# Patient Record
Sex: Female | Born: 1965 | Race: Black or African American | Hispanic: No | Marital: Single | Smoking: Former smoker
Health system: Southern US, Community
[De-identification: ages and names within clinical notes are randomized; demographics above are authoritative.]

## PROBLEM LIST (undated history)

## (undated) DIAGNOSIS — G4700 Insomnia, unspecified: Secondary | ICD-10-CM

## (undated) DIAGNOSIS — C801 Malignant (primary) neoplasm, unspecified: Secondary | ICD-10-CM

## (undated) DIAGNOSIS — E039 Hypothyroidism, unspecified: Secondary | ICD-10-CM

## (undated) DIAGNOSIS — G43909 Migraine, unspecified, not intractable, without status migrainosus: Secondary | ICD-10-CM

## (undated) DIAGNOSIS — I639 Cerebral infarction, unspecified: Secondary | ICD-10-CM

## (undated) DIAGNOSIS — Z789 Other specified health status: Secondary | ICD-10-CM

## (undated) DIAGNOSIS — R0602 Shortness of breath: Secondary | ICD-10-CM

## (undated) DIAGNOSIS — Z90710 Acquired absence of both cervix and uterus: Secondary | ICD-10-CM

## (undated) DIAGNOSIS — D649 Anemia, unspecified: Secondary | ICD-10-CM

## (undated) DIAGNOSIS — M1990 Unspecified osteoarthritis, unspecified site: Secondary | ICD-10-CM

## (undated) DIAGNOSIS — I10 Essential (primary) hypertension: Secondary | ICD-10-CM

## (undated) DIAGNOSIS — E7800 Pure hypercholesterolemia, unspecified: Secondary | ICD-10-CM

## (undated) DIAGNOSIS — E079 Disorder of thyroid, unspecified: Secondary | ICD-10-CM

## (undated) DIAGNOSIS — F419 Anxiety disorder, unspecified: Secondary | ICD-10-CM

## (undated) DIAGNOSIS — C539 Malignant neoplasm of cervix uteri, unspecified: Secondary | ICD-10-CM

## (undated) HISTORY — PX: THYROIDECTOMY, PARTIAL: SHX18

## (undated) HISTORY — PX: CHOLECYSTECTOMY: SHX55

## (undated) HISTORY — PX: ABDOMINAL HYSTERECTOMY: SHX81

## (undated) HISTORY — DX: Insomnia, unspecified: G47.00

---

## 2003-08-28 ENCOUNTER — Emergency Department (HOSPITAL_COMMUNITY): Admission: EM | Admit: 2003-08-28 | Discharge: 2003-08-28 | Payer: Self-pay | Admitting: Emergency Medicine

## 2003-09-01 ENCOUNTER — Emergency Department (HOSPITAL_COMMUNITY): Admission: EM | Admit: 2003-09-01 | Discharge: 2003-09-01 | Payer: Self-pay | Admitting: Emergency Medicine

## 2004-04-26 ENCOUNTER — Emergency Department (HOSPITAL_COMMUNITY): Admission: EM | Admit: 2004-04-26 | Discharge: 2004-04-26 | Payer: Self-pay | Admitting: Emergency Medicine

## 2004-06-08 ENCOUNTER — Emergency Department (HOSPITAL_COMMUNITY): Admission: EM | Admit: 2004-06-08 | Discharge: 2004-06-08 | Payer: Self-pay | Admitting: Emergency Medicine

## 2004-08-06 ENCOUNTER — Emergency Department (HOSPITAL_COMMUNITY): Admission: EM | Admit: 2004-08-06 | Discharge: 2004-08-07 | Payer: Self-pay | Admitting: Emergency Medicine

## 2004-12-12 ENCOUNTER — Emergency Department (HOSPITAL_COMMUNITY): Admission: EM | Admit: 2004-12-12 | Discharge: 2004-12-12 | Payer: Self-pay | Admitting: Emergency Medicine

## 2004-12-29 ENCOUNTER — Emergency Department (HOSPITAL_COMMUNITY): Admission: EM | Admit: 2004-12-29 | Discharge: 2004-12-30 | Payer: Self-pay

## 2005-01-18 ENCOUNTER — Encounter: Admission: RE | Admit: 2005-01-18 | Discharge: 2005-01-18 | Payer: Self-pay | Admitting: Nephrology

## 2005-02-04 ENCOUNTER — Encounter: Admission: RE | Admit: 2005-02-04 | Discharge: 2005-02-04 | Payer: Self-pay | Admitting: Nephrology

## 2005-03-18 ENCOUNTER — Emergency Department (HOSPITAL_COMMUNITY): Admission: EM | Admit: 2005-03-18 | Discharge: 2005-03-18 | Payer: Self-pay | Admitting: Family Medicine

## 2005-03-22 ENCOUNTER — Emergency Department (HOSPITAL_COMMUNITY): Admission: EM | Admit: 2005-03-22 | Discharge: 2005-03-22 | Payer: Self-pay | Admitting: Family Medicine

## 2005-03-22 ENCOUNTER — Ambulatory Visit (HOSPITAL_COMMUNITY): Admission: RE | Admit: 2005-03-22 | Discharge: 2005-03-22 | Payer: Self-pay | Admitting: Family Medicine

## 2005-05-21 ENCOUNTER — Emergency Department (HOSPITAL_COMMUNITY): Admission: EM | Admit: 2005-05-21 | Discharge: 2005-05-22 | Payer: Self-pay | Admitting: Emergency Medicine

## 2005-09-08 ENCOUNTER — Encounter: Admission: RE | Admit: 2005-09-08 | Discharge: 2005-09-08 | Payer: Self-pay | Admitting: Nephrology

## 2005-11-09 ENCOUNTER — Emergency Department (HOSPITAL_COMMUNITY): Admission: EM | Admit: 2005-11-09 | Discharge: 2005-11-10 | Payer: Self-pay | Admitting: Emergency Medicine

## 2006-04-25 ENCOUNTER — Emergency Department (HOSPITAL_COMMUNITY): Admission: EM | Admit: 2006-04-25 | Discharge: 2006-04-25 | Payer: Self-pay | Admitting: Emergency Medicine

## 2006-05-03 ENCOUNTER — Emergency Department (HOSPITAL_COMMUNITY): Admission: EM | Admit: 2006-05-03 | Discharge: 2006-05-03 | Payer: Self-pay | Admitting: Family Medicine

## 2007-02-07 ENCOUNTER — Emergency Department (HOSPITAL_COMMUNITY): Admission: EM | Admit: 2007-02-07 | Discharge: 2007-02-07 | Payer: Self-pay | Admitting: Family Medicine

## 2007-09-09 ENCOUNTER — Emergency Department (HOSPITAL_COMMUNITY): Admission: EM | Admit: 2007-09-09 | Discharge: 2007-09-10 | Payer: Self-pay | Admitting: Emergency Medicine

## 2008-04-17 ENCOUNTER — Inpatient Hospital Stay (HOSPITAL_COMMUNITY): Admission: EM | Admit: 2008-04-17 | Discharge: 2008-04-20 | Payer: Self-pay | Admitting: Family Medicine

## 2008-04-18 ENCOUNTER — Encounter (INDEPENDENT_AMBULATORY_CARE_PROVIDER_SITE_OTHER): Payer: Self-pay | Admitting: Cardiovascular Disease

## 2008-10-14 ENCOUNTER — Inpatient Hospital Stay (HOSPITAL_COMMUNITY): Admission: EM | Admit: 2008-10-14 | Discharge: 2008-10-19 | Payer: Self-pay | Admitting: Emergency Medicine

## 2008-11-26 ENCOUNTER — Ambulatory Visit: Payer: Self-pay | Admitting: Internal Medicine

## 2008-11-26 DIAGNOSIS — R519 Headache, unspecified: Secondary | ICD-10-CM | POA: Insufficient documentation

## 2008-11-26 DIAGNOSIS — R51 Headache: Secondary | ICD-10-CM

## 2008-11-26 DIAGNOSIS — J45909 Unspecified asthma, uncomplicated: Secondary | ICD-10-CM

## 2008-11-28 ENCOUNTER — Inpatient Hospital Stay (HOSPITAL_COMMUNITY): Admission: RE | Admit: 2008-11-28 | Discharge: 2008-11-30 | Payer: Self-pay | Admitting: Obstetrics and Gynecology

## 2008-11-28 ENCOUNTER — Encounter (INDEPENDENT_AMBULATORY_CARE_PROVIDER_SITE_OTHER): Payer: Self-pay | Admitting: Obstetrics and Gynecology

## 2008-12-21 ENCOUNTER — Emergency Department (HOSPITAL_COMMUNITY): Admission: EM | Admit: 2008-12-21 | Discharge: 2008-12-21 | Payer: Self-pay | Admitting: Family Medicine

## 2008-12-29 ENCOUNTER — Emergency Department (HOSPITAL_COMMUNITY): Admission: EM | Admit: 2008-12-29 | Discharge: 2008-12-30 | Payer: Self-pay | Admitting: *Deleted

## 2009-01-02 ENCOUNTER — Ambulatory Visit (HOSPITAL_COMMUNITY): Admission: RE | Admit: 2009-01-02 | Discharge: 2009-01-02 | Payer: Self-pay | Admitting: Obstetrics and Gynecology

## 2009-01-03 ENCOUNTER — Ambulatory Visit (HOSPITAL_COMMUNITY): Admission: RE | Admit: 2009-01-03 | Discharge: 2009-01-03 | Payer: Self-pay | Admitting: Obstetrics and Gynecology

## 2009-01-14 ENCOUNTER — Encounter (HOSPITAL_BASED_OUTPATIENT_CLINIC_OR_DEPARTMENT_OTHER): Admission: RE | Admit: 2009-01-14 | Discharge: 2009-02-13 | Payer: Self-pay | Admitting: Internal Medicine

## 2009-04-29 ENCOUNTER — Emergency Department (HOSPITAL_COMMUNITY): Admission: EM | Admit: 2009-04-29 | Discharge: 2009-04-29 | Payer: Self-pay | Admitting: Emergency Medicine

## 2009-04-29 ENCOUNTER — Emergency Department (HOSPITAL_COMMUNITY): Admission: EM | Admit: 2009-04-29 | Discharge: 2009-04-29 | Payer: Self-pay | Admitting: Family Medicine

## 2009-05-13 ENCOUNTER — Emergency Department (HOSPITAL_COMMUNITY): Admission: EM | Admit: 2009-05-13 | Discharge: 2009-05-13 | Payer: Self-pay | Admitting: Emergency Medicine

## 2009-06-25 ENCOUNTER — Emergency Department (HOSPITAL_COMMUNITY): Admission: EM | Admit: 2009-06-25 | Discharge: 2009-06-25 | Payer: Self-pay | Admitting: Emergency Medicine

## 2009-09-18 ENCOUNTER — Emergency Department (HOSPITAL_COMMUNITY): Admission: EM | Admit: 2009-09-18 | Discharge: 2009-09-18 | Payer: Self-pay | Admitting: Emergency Medicine

## 2010-10-17 ENCOUNTER — Observation Stay (HOSPITAL_COMMUNITY): Admission: EM | Admit: 2010-10-17 | Discharge: 2010-10-18 | Payer: Self-pay | Admitting: Emergency Medicine

## 2011-01-03 ENCOUNTER — Encounter: Payer: Self-pay | Admitting: Obstetrics and Gynecology

## 2011-03-23 LAB — CBC
HCT: 31.6 % — ABNORMAL LOW (ref 36.0–46.0)
Hemoglobin: 10.1 g/dL — ABNORMAL LOW (ref 12.0–15.0)
MCHC: 31.9 g/dL (ref 30.0–36.0)
MCV: 70.8 fL — ABNORMAL LOW (ref 78.0–100.0)
RDW: 19.5 % — ABNORMAL HIGH (ref 11.5–15.5)

## 2011-03-23 LAB — POCT I-STAT, CHEM 8
Calcium, Ion: 1.22 mmol/L (ref 1.12–1.32)
Glucose, Bld: 90 mg/dL (ref 70–99)
HCT: 33 % — ABNORMAL LOW (ref 36.0–46.0)
Hemoglobin: 11.2 g/dL — ABNORMAL LOW (ref 12.0–15.0)
TCO2: 21 mmol/L (ref 0–100)

## 2011-03-29 LAB — CBC
HCT: 27.1 % — ABNORMAL LOW (ref 36.0–46.0)
Hemoglobin: 8.7 g/dL — ABNORMAL LOW (ref 12.0–15.0)
MCHC: 32 g/dL (ref 30.0–36.0)
RDW: 19.4 % — ABNORMAL HIGH (ref 11.5–15.5)

## 2011-04-27 NOTE — Discharge Summary (Signed)
Wendy Hoffman, Wendy Hoffman               ACCOUNT NO.:  [id number]   MEDICAL RECORD NO.:  [id number]          PATIENT TYPE:  INP   LOCATION:  9305                          FACILITY:  [facility]   PHYSICIAN:  Hal Morales, M.D.DATE OF BIRTH:  1966/06/11   DATE OF ADMISSION:  11/28/2008  DATE OF DISCHARGE:  11/30/2008                               DISCHARGE SUMMARY   DISCHARGE DIAGNOSES:  Menorrhagia, symptomatic uterine fibroids,  adenomyosis, pelvic adhesions, anemia, dysmenorrhea, obesity, and  asthma.   OPERATION ON THE DATE OF ADMISSION:  The patient underwent laparoscopy  followed by a total abdominal hysterectomy tolerating procedure well.  The patient was found to have a uterus enlarged to approximately 10-week  size with several myomata.  There were adhesions between the right  anterior fundus and the right pelvic side wall and omentum.  There were  also filmy adhesions of the posterior uterus particularly on the left  side and between the right ovary and posterior uterus.  The tubes were  status post tubal interruption for sterilization.   HISTORY OF PRESENT ILLNESS:  Ms. Wendy Hoffman is a 45 year old single black  female para 3-0-1-3 presenting for definitive therapy of uterine  fibroids and menorrhagia in the form of hysterectomy.  Please see the  patient's dictated history and physical examination for details.   PREOPERATIVE PHYSICAL EXAMINATION:  Blood pressure 120/80, pulse 88,  respirations 16, temperature 98.7 degrees Fahrenheit orally, weight 284  pounds, height 5 feet 4 inches tall.  General exam was within normal  limits, though do note abdominal exam was compromised by the patient's  body habitus.  Pelvic exam EGD was within normal limits.  Vagina was  rugose.  The cervix was without gross lesions.  The uterus does not feel  enlarged, but the examination is compromised by the patient's habitus.  There were no adnexal masses found.  The uterus was sounded to 10 cm.  Rectovaginal exam did not reveal any rectovaginal masses.   HOSPITAL COURSE:  On the date of admission, the patient underwent  aforementioned procedures.  The patient's postoperative course was  unremarkable with her tolerating very well.  A postop hemoglobin of 7.7  (preop hemoglobin was 9.3).  By postop day #2, the patient had resumed  bowel and bladder function, denied any symptoms or orthostasis, any  symptoms of being orthostatic, and was therefore deemed ready for  discharge home.   DISCHARGE MEDICATIONS:  The patient was directed to her home medication  reconciliation form.  She was also prescribed ibuprofen 600 mg with food  every 6 hours for 5 days then as needed for pain, Percocet 5/325 one to  two tablets every 4 hours as needed for pain, iron sulfate 325 mg 1  tablet daily.   FOLLOWUP:  The patient is scheduled for postop visit with Dr. Pennie Hoffman on  December 04, 2008, and was advised that a nurse from Dr. Lilian Hoffman  office would call her with details of her appointment time.   DISCHARGE INSTRUCTIONS:  The patient was advised to call for any  temperature of 100.4 or greater, any increased pain, which is  not  relieved by her pain medications, or any increased vaginal bleeding.  She was given a copy of the brochure entitled recovering from surgery.  She was further advised to avoid driving for 2-3 weeks, heavy lifting  for 6 weeks, intercourse for 6 weeks that she may shower and bathe that  she may walk up steps, and should increase her activities slowly.  The  patient's diet was without restriction, though she was cautioned not to  consume any gas producing foods.  The patient's wound care was given to  the patient in printed form.  She was further advised to use a blow dry  whenever moisture accumulate around her incisional area.   FINAL PATHOLOGY:  Uterus and cervix and a portion of left fallopian  tube, hysterectomy, and salpingectomy:  Cervix - mild chronic   inflammation, endometrium - weakly proliferative-type endometrium with  pseudodecidualization of the stroma, myometrium - adenomyosis and  leiomyomata, serosa - essentially unremarkable and left fallopian tube -  benign paratubal cyst.      Wendy Hoffman.      Hal Morales, M.D.  Electronically Signed    EJP/MEDQ  D:  12/26/2008  T:  12/27/2008  Job:  [id number]

## 2011-04-27 NOTE — Op Note (Signed)
Wendy Hoffman, Wendy Hoffman               ACCOUNT NO.:  [id number]   MEDICAL RECORD NO.:  [id number]          PATIENT TYPE:  INP   LOCATION:  9305                          FACILITY:  [facility]   PHYSICIAN:  Hal Morales, M.D.DATE OF BIRTH:  11-17-1966   DATE OF PROCEDURE:  DATE OF DISCHARGE:                               OPERATIVE REPORT   PREOPERATIVE DIAGNOSES:  1. Menorrhagia.  2. Uterine fibroids.  3. Anemia, requiring transfusion.  4. Dysmenorrhea.  5. Obesity.  6. Asthma.   POSTOPERATIVE DIAGNOSES:  1. Menorrhagia.  2. Uterine fibroids.  3. Anemia, requiring transfusion.  4. Dysmenorrhea.  5. Obesity.  6. Asthma.  7. Pelvic adhesions.   PROCEDURE:  1. Laparoscopy.  2. Total abdominal hysterectomy.   SURGEON:  Vanessa P. Pennie Rushing, MD   FIRST ASSISTANT:  Janine Limbo, MD   ANESTHESIA:  General orotracheal.   ESTIMATED BLOOD LOSS:  250 mL.   COMPLICATIONS:  None.   FINDINGS:  The uterus was enlarged to approximately 10 weeks' size with  several myomata.  There were adhesions between the right anterior fundus  and the right pelvic sidewall and omentum.  There were filmy adhesions  of the posterior uterus particularly on the left side and between the  right ovary and posterior uterus.  The tubes were status post tubal  interruption for sterilization.   PROCEDURE:  The patient was taken to the operating room after  appropriate identification and placed on the operating table.  After the  attainment of adequate general anesthesia, she was placed in the  modified lithotomy position.  The abdomen, perineum, and vagina were  prepped with multiple layers of Betadine, and a Foley catheter inserted  into the bladder and connected to straight drainage.  A Hulka tenaculum  was placed on the anterior cervix.  The abdomen was draped as a sterile  field.  Suprapubic and subumbilical injections of 0.25% Marcaine for a  total of 10 mL was undertaken.  The subumbilical incision  was made, and  a Veress cannula placed through that incision into the abdomen, but  because of the thick panniculus, it could not be documented that the  placement was intraperitoneal.  The Veress cannula was replaced a second  time, but after a third attempt a decision was made to proceed with an  open laparoscopy.  The subumbilical incision was slightly extended and  blunt dissection taken down to the fascia which was elevated.  The  fascia was incised, and the peritoneum entered.  The entry was then  marked with sutures of 0 Vicryl, and the Hasson cannula placed through  that incision into the peritoneal cavity.  A pneumoperitoneum was  created, but there was noted to be lots of leakage, and a Vaseline gauze  was placed around the trocar.  The laparoscope was placed through the  trocar sleeve.  A left mid quadrant incision was made for placement of a  laparoscopic trocar 5 mm under direct visualization.  A suprapubic  incision was made to the right of midline for placement of a third  trocar, this one 5 mm as  well.  After placement of all of the  appropriate trocars, it was noted that the area to the right of the  fundus required lysis of adhesions.  Steep Trendelenburg was requested  in order to obtain adequate visualization.  The patient was unable to  tolerate this and became hypercarbic.  The anesthesia personnel notified  the surgeons that they were unable to ventilate the patient in steep  Trendelenburg because of her obesity and it was clear that the  Trendelenburg would be required for any further laparoscopic dissection.  It was decided that the patient would undergo abdominal hysterectomy.  All instruments were then removed from the abdominal cavity under direct  visualization as the CO2 was allowed to escape.  The fascial sutures  were completed in the subumbilical incision and tied down.  A  subcuticular suture of 3-0 Vicryl was then used to close the skin  incision.  The  left abdominal incision was closed with a subcuticular  suture of 3-0 Vicryl.  The right suprapubic incision was incorporated  into the suprapubic incision which was made after the patient had been  placed in the supine position.  The abdomen was opened in layers and the  peritoneum entered.  The O'Connor-O'Sullivan retractor was placed in the  incision, and the bowel packed cephalad.  A Kelly clamp was placed at  each cornual region, and sharp and blunt dissection used to dissect the  adherent omentum off the right anterior fundus.  The utero-ovarian  ligament and round ligament were then clamped, cut, and suture ligated.  On the left side, the round ligament was isolated, suture ligated, and  incised and that incision taken anteriorly to the broad ligament to  develop the bladder flap.  The bladder was then sharply dissected off  the anterior cervix.  The utero-ovarian ligament on the left side was  then clamped, cut, and suture ligated.  The uterine arteries were  skeletonized and successively clamped, cut, and suture ligated on the  right and left sides.  The parametrial and paracervical tissues were  then clamped, cut, and suture ligated down to the level of the  uterosacral ligaments which were clamped, cut, and suture ligated and  those sutures held.  The vaginal angles were then clamped, cut, and  suture ligated and those sutures held.  The remainder of the cervix was  excised from the upper vagina, and the uterus and cervix removed from  the operative field.  Hemostatic sutures were placed in the vaginal cuff  allowing for complete closure of the vaginal cuff, and hemostasis noted  to be adequate after copious irrigation.  All sutures used were 0  Vicryl.  The left tube and ovary were then isolated.  The remainder of  the tube status post bilateral tubal sterilization was then clamped,  cut, and the mesosalpingeal base suture ligated for adequate hemostasis.  The right fallopian  tube was noted to be so adherent to the ovary that  salpingectomy on that side was not performed.  Copious irrigation was  carried out and hemostasis noted to be adequate.  The abdominal  peritoneum was closed with a running suture of 2-0 Vicryl.  The rectus  muscles were made hemostatic with Bovie cautery.  The rectus fascia was  closed with running sutures of 0 Vicryl from each apex to the midline  and tied in the midline.  The subcutaneous tissue was reapproximated  with a running suture of 2-0 plain.  Skin staples were applied to the  skin incision, and a sterile  dressing applied.  The patient was awakened from general anesthesia and  taken to the recovery room in satisfactory condition having tolerated  the procedure well with sponge and instrument counts correct.   SPECIMENS TO PATHOLOGY:  Uterus, cervix, and left tube.      Hal Morales, M.D.  Electronically Signed     VPH/MEDQ  D:  11/28/2008  T:  11/29/2008  Job:  [id number]   cc:   Jarome Matin, M.D.  Fax: [phone]

## 2011-04-27 NOTE — Consult Note (Signed)
Wendy Hoffman, Wendy Hoffman               ACCOUNT NO.:  [id number]   MEDICAL RECORD NO.:  [id number]          PATIENT TYPE:  INP   LOCATION:  4737                         FACILITY:  [facility]   PHYSICIAN:  John C. [name], M.D.    DATE OF BIRTH:  [birth year]   DATE OF CONSULTATION:  DATE OF DISCHARGE:                                 CONSULTATION   We were asked to see Wendy Hoffman today in consultation for a low  hemoglobin by Dr. Orpah Cobb.   HISTORY OF PRESENT ILLNESS:  This is a 45 year old female who is  complaining of severe right chest pain.  It feels like a squeezing that  has lasted for approximately 24 hours that is what brought her to the  hospital.  She is also complaining of a migraine headache.  She has a  history of anemia with her 3 pregnancies.  She also has very heavy  menstrual periods during which she passes clots and she claims that she  has had anemia with those.  She does not take iron because it  constipates her.  She also reports that she takes regular 800 mg  ibuprofen tablets for her migraines.  She does report heartburn and  indigestion.  She used to take Nexium for this, but her prescription ran  out over a month ago.  She denies any melena or hematochezia, although  she reports that she did have an episode of bright red blood per rectum,  which she reported to her primary care physician.  She tells me that she  has recent constipation, but a few minutes later she tells me she has  diarrhea every morning.   PAST MEDICAL HISTORY:  Significant for coronary artery disease,  hypertension, type 2 diabetes, hypothyroidism, asthma, migraines, and  obesity.  She has had a cholecystectomy and thyroidectomy and 3 C-  sections with her 3 children.  She tells me that her heart stopped each  time she had a C-section.   CURRENT MEDICATIONS:  Advair, albuterol, Singulair, sulindac, Synthroid,  and Topamax.   ALLERGIES:  She has an allergy to PENICILLIN and ASPIRIN.   REVIEW OF  SYSTEMS:  Significant for a decreased appetite, nausea,  fatigue, and headache.   SOCIAL HISTORY:  Negative for alcohol, tobacco, and drugs.   FAMILY HISTORY:  Positive for ulcer disease in her father.  Negative for  colon cancer.   PHYSICAL EXAMINATION:  GENERAL:  She is alert and oriented, sleepy,  obese.  VITAL SIGNS:  Her temperature is 97.1, pulse is 61, respirations are 20,  and blood pressure is 142/96.  CARDIOVASCULAR:  Has a regular rate and rhythm.  LUNGS:  Clear to auscultation anteriorly.  ABDOMEN:  Soft, nontender, and nondistended with good bowel sounds.  RECTAL:  She has internal hemorrhoids.  No stool in rectal bowl, but the  fluid was guaiac positive.  There was no blood on my glove.   LABORATORY DATA:  Current BMET is within normal limits.  CBC is  significant for current hemoglobin of 8.6.  When she came in, it looks  like they took 2  values within the same hour, one of which was 7.8, the  other was 9.2.  Her current hemoglobin shows an MCV value of 73.3, white  count 26.4, platelets 345,000, PTT is 27, PT is 12.9.  There have been  no radiological exams done at this admission.   ASSESSMENT:  Dr. Dorena Cookey has seen and examined the patient, collected  history, and reviewed her chart.  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  She does have a history of strong nonsteroidal  antiinflammatory drug use without proton pump inhibitor coverage;  however, she also has heavy periods and a long history of anemia.  We  will do an anemia panel, monitor her closely.  Need for endoscopy will  be determined later.  The patient will probably benefit from an  outpatient colonoscopy.   Thank you very much for this consultation.      Stephani Police, PA    ______________________________  [name], M.D.    MLY/MEDQ  D:  04/17/2008  T:  04/18/2008  Job:  [id number]   cc:   Dr. Leretha Dykes

## 2011-04-27 NOTE — Discharge Summary (Signed)
NAMEGABRILLE, Wendy Hoffman               ACCOUNT NO.:  [id number]   MEDICAL RECORD NO.:  [id number]          PATIENT TYPE:  INP   LOCATION:  9305                          FACILITY:  [facility]   PHYSICIAN:  Jarome Matin, M.D.DATE OF BIRTH:  [birth year]   DATE OF ADMISSION:  10/14/2008  DATE OF DISCHARGE:  10/19/2008                               DISCHARGE SUMMARY   ADMITTING DIAGNOSES:  1. Chills.  2. Fever.  3. Headache.  4. Chest pain.  5. Body aches.   DISCHARGE DIAGNOSES:  1. Iron deficiency anemia.  2. Flu, possible HINI.  The patient was initially kept in isolation      because of the flu virus  3. Orthostatic hypotension.  4. Type 2 diabetes mellitus.  5. Hypertension.  6. Asthma.  7. Morbid obesity.  8. Post-surgical hypothyroid.  9. Atherosclerosis of coronary vessels.  10.She has a history of TIA's.   BRIEF HISTORY AND PHYSICAL AND HOSPITAL COURSE:  This was on admission.  This 45 year old African American, who for the past 24-48 hours was  having chills, fever, headaches, chest pain, and weakness and she had  been admitted with chest pain back in May 2009.  The patient is a  diabetic, she has a history of hypertension.  She is obese and has a  history of hyperthyroid.  She lives at home with her son and her niece.  She also has a history of TIA's in the past.   Hemoglobin on this admission was 7.3.   VITAL SIGNS:  Temperature was 103.1 and fluctuated down to 98.3.  Pulse  was 112 to 107.  Blood pressure was 176/83 to 117/30, respirations 20.  GENERAL:  The patient was alert.  The patient was oriented.  NECK:  Neck veins were flat.  CHEST:  Clear to auscultation with percussion.  Regular sinus rhythm  with occasional PC.  Had occasional rhonchi.  ABDOMEN:  Soft.  No mass, no organomegaly.  She had active bowel sounds.  CHEST:  Clear to auscultation and percussion with an occasional wheeze.  SKIN:  Warm and dry.   The patient was admitted to telemetry and also  __________ on isolation  because of possible flu, in the setting of possible H1N1.  The patient  also got 3 units of packed red blood cells.  Gradually, she began to  feel better.  Her fever came down and was subsequently 99.1, pulse 80,  respirations 20, blood pressure 111/68, and 99% on room air.    Some of the pertinent labs.  Blood cultures had been fairly negative.  Iron was low at 14 and  saturation of 4.  Blood culture was negative for growth.  TSH 2.036.  Triglycerides 155.  CK-MB is showing an indication for myocardial  injury.  The patient was transfused up to about 9 hemoglobin.  Chest x-  ray did show some bronchial perihilar markings and peribronchial  __________, and the patient also did have an ultrasound of her pelvis,  which showed an enlargement of the uterus.  The entry __________ fibroid  and this maybe causing her vaginal bleeding.  We transfused the patient  up to a hemoglobin of 9.1 and asked GYN to see the patient and the  patient was known to GYN because of fibroid  and they agreed that they  would more or likely see her as an outpatient.  We did the ultrasound of  the pelvis and she has fibroid and GYN is going to follow her as an  outpatient.  So after a transfusion and the patient after being  stabilized, felt much better and chest x-ray became negative and the  patient was scheduled to be seen by GYN as an outpatient.  Dr. Ellyn Hack  __________ [facility], is going to see her.  Her temp is 98, pulse  67, respirations 16, and blood pressure 142/96 with a 100% on room air.  Chest, she had occasional rhonchi and a scheduled was made and she will  be returning to the office in about 2 weeks after discharge.           ______________________________  Jarome Matin, M.D.     CEF/MEDQ  D:  12/19/2008  T:  12/20/2008  Job:  [id number]

## 2011-04-27 NOTE — Op Note (Signed)
Wendy Hoffman, Wendy Hoffman               ACCOUNT NO.:  [id number]   MEDICAL RECORD NO.:  [id number]          PATIENT TYPE:  AMB   LOCATION:  SDC                           FACILITY:  [facility]   PHYSICIAN:  Hal Morales, M.D.DATE OF BIRTH:  1966-04-08   DATE OF PROCEDURE:  01/03/2009  DATE OF DISCHARGE:                               OPERATIVE REPORT   PREOPERATIVE DIAGNOSIS:  Status post abdominal hysterectomy with  drainage from abdominal incision.   POSTOPERATIVE DIAGNOSIS:  Left incisional granulomatous tissue.   PROCEDURE:  Examination under anesthesia and excision of granulomatous  tissue.   SURGEON:  Vanessa P. Haygood, MD   ANESTHESIA:  General orotracheal.   ESTIMATED BLOOD LOSS:  Less than 10 mL.   COMPLICATIONS:  None.   FINDINGS:  The abdominal incision was noted to be completely intact.  The previously noted pinpoint defect in the midline incision that had  been allowing for serous drainage was completely closed.  No drainage  could be appreciated nor reproduced with pressure along the entire  surface of the incision.  No pockets of fluid were palpable, which was  consistent with the CT scan.  At the left pole of the incision, a 1-cm  area of granulomatous tissue was noted and this was excised.  The cut  end was cauterized and hemostasis noted to be adequate.  No further  pockets of fluid could be appreciated.  No fluid could be aspirated upon  placement of a spinal needle in the incision.   PROCEDURE:  The patient was taken to the operating room after  appropriate identification and placed on the operating table.  After the  attainment of adequate general anesthesia, she was placed on the table  in the supine position.  The panniculus was then taped to the anesthesia  bar.  The incision area was cleansed with Betadine and draped.  The  above-noted findings were made and the granulation tissue sharply  excised then cauterized on its edge.  This allowed for adequate  hemostasis.  No drainage was noted at any time during the procedure from  her incision.  The procedure was thus ended and the patient was taken to  the recovery room after awakening from general anesthesia in  satisfactory condition.      Hal Morales, M.D.  Electronically Signed     VPH/MEDQ  D:  01/03/2009  T:  01/04/2009  Job:  [id number]

## 2011-04-27 NOTE — Op Note (Signed)
NAMECANIYA, TAGLE               ACCOUNT NO.:  [id number]   MEDICAL RECORD NO.:  [id number]          PATIENT TYPE:  INP   LOCATION:  4737                         FACILITY:  [facility]   PHYSICIAN:  John C. [name], M.D.    DATE OF BIRTH:  [birth year]   DATE OF PROCEDURE:  DATE OF DISCHARGE:                               OPERATIVE REPORT   INDICATIONS FOR PROCEDURE:  Significant iron deficiency anemia with  hemoglobin of 8.   PROCEDURE:  The patient was placed in the left lateral decubitus  position and placed on the pulse monitor with continuous low-flow oxygen  delivered by nasal cannula.  She was sedated with 75 mcg IV fentanyl and  8 mg of IV Versed.  The Olympus video endoscope was advanced under  direct vision into the oropharynx and esophagus.  The esophagus was  straight and of normal caliber.  The squamocolumnar line at 38 cm.  There was no visible hiatal hernia, ring stricture, or other abnormality  of the GE junction.  The stomach was entered and a small amount of  liquid secretions were suctioned from the fundus.  Retroflexed view of  cardia was unremarkable.  The fundus, body, antrum and pylorus all  appeared normal.  The duodenum was entered and the bulb showed 3-4  scattered erosions with exudate, possibly meeting the criteria for  shallow ulcers but with no stigma of hemorrhage.  The postbulbar  duodenum appeared normal.  The scope was withdrawn back into the stomach  and CLO-test was obtained.  The scope was then withdrawn and the patient  returned to the recovery room in stable condition.  She tolerated the  procedure well.  There were no immediate complications.   IMPRESSION:  Duodenal erosions with exudate.   PLAN:  Free to use proton-pump inhibitor and await CLO-test.           ______________________________  Everardo All. [name], M.D.     JCH/MEDQ  D:  04/18/2008  T:  04/19/2008  Job:  [id number]   cc:   Ricki Rodriguez, M.D.

## 2011-04-27 NOTE — H&P (Signed)
Wendy Hoffman, Wendy Hoffman               ACCOUNT NO.:  [id number]   MEDICAL RECORD NO.:  [id number]          PATIENT TYPE:  INP   LOCATION:  3713                         FACILITY:  [facility]   PHYSICIAN:  [name], M.D.DATE OF BIRTH:  October 09, 1966   DATE OF ADMISSION:  10/13/2008  DATE OF DISCHARGE:                              HISTORY & PHYSICAL   FINAL DIAGNOSES:  1. Anemia, critical.  2. Febrile illness, cannot rule out flu.  3. Hypotension.  4. History of diabetes mellitus.  5. Hypertension.  6. Bronchial asthma.  7. Iron deficiency anemia.  8. Morbid obesity.  9. Postsurgical hypothyroidism.   HISTORY OF PRESENT ILLNESS:  The patient is a 45 year old African  American lady who presented to ED with fever, chills, sore throat,  rhinorrhea, generalized weakness, and malaise.  She was noted to be  hypotensive with borderline blood pressures in the 80s.  She was also  noted to have a severely reduced hemoglobin and was admitted for further  management in this regard.  The patient was also noted to have  significant orthostasis.  In view of hypotensive orthostasis and low  hemoglobin, the patient admitted for further management in this regard,  further workup of her febrile illness.   PAST MEDICAL HISTORY:  As noted above.  In addition, the patient is said  to have history of CAD.   PAST SURGICAL HISTORY:  The patient is status post cholecystectomy,  thyroidectomy, and 3 cesarean sections.   CURRENT MEDICATIONS:  Advair, Singulair, albuterol, and Synthroid.   ALLERGIES:  She was allergic to PENICILLIN, ASPIRIN which causes a rash.   FAMILY HISTORY:  Positive for diabetes mellitus and hypertension.  Family history is positive for ulcer in her father, no history of colon  cancer.   SOCIAL HISTORY:  The patient does not smoke cigarette or drink alcohol.   REVIEW OF SYSTEMS:  The patient denies any sputum production, but admits  to cough.  No chest pain.  No PND.  No  orthopnea.  Admits to dizziness.  __________ 10-point __________ was unremarkable except as stated above.   VITAL SIGNS:  Noted as documented serially from ED records and  hypotension, blood pressure as noted.  GENERAL:  The patient was  __________ in acute distress.  HEENT:  Normocephalic and atraumatic.  Pupils equal, round, and reactive  to light.  Extraocular movements intact.  Oropharynx moist.  __________.  NECK:  Supple.  No JVD.  LUNGS:  Vesicular breath sounds.  No crackles.  CARDIAC:  Regular rate and rhythm, no gallops.  ABDOMEN:  Obese.  Bowel sound present.  EXTREMITIES:  No cyanosis.  No edema.  NEUROLOGIC:  The patient is alert and responsive.   LABORATORY DATA:  X-ray of the chest showed no acute infiltrate.  The  patient's white count was 6.3, hemoglobin 8.5, hematocrit 25.0, MCV  66.7, and platelet count 234.  Sodium 137, potassium 3.7, chloride 103,  glucose 102, BUN 5, and creatinine 1.1.  Total CPK 92, MB fraction 0.4,  and troponin I 0.01.   ASSESSMENT:  This patient presents with hypotension and febrile illness  with orthostasis, does not have leukocytosis, no evidence of sepsis.  The patient is admitted for blood transfusion but as she has history of  coronary artery disease and will aim for target hemoglobin of 10.0.  The  patient will be getting cultures for the patient and there is no  evidence of any focal infiltrates on x-ray.  No antibiotic will be used  __________ does not have any leukocytosis, may have to start her on  antibiotics coverage p.r.n.  We will follow up on her post-transfusion  transfusion hemoglobin.  The patient will need to be kept in isolation  since she is believed to have a febrile viral illness at this time.  She  also indicates she has a history of menorrhagia that may account for her  anemia.  She indicates that she is anemic all the time.      [name], M.D.  Electronically Signed     GO/MEDQ  D:  10/14/2008  T:   10/14/2008  Job:  [id number]   cc:   Jarome Matin, M.D.

## 2011-04-27 NOTE — H&P (Signed)
Wendy Hoffman, BLANKENBECKLER               ACCOUNT NO.:  [id number]   MEDICAL RECORD NO.:  [id number]          PATIENT TYPE:  AMB   LOCATION:  SDC                           [facility]   PHYSICIAN:  Hal Morales, M.D.DATE OF BIRTH:  10-Apr-1966   DATE OF ADMISSION:  11/28/2008  DATE OF DISCHARGE:                              HISTORY & PHYSICAL   HISTORY OF PRESENT ILLNESS:  The patient is a 45 year old black single  female para 3-0-1-3 who presents for definitive therapy of uterine  fibroids and menorrhagia.  She has for a number of years had  increasingly heavy menses which now amount to 7 days of flow  requiring  hourly changes in her protection and severe cramping associated with  this.  The patient has as a result of her menorrhagia had significant  anemia with hemoglobin as low as 6.0 and requiring a transfusion as  recently as November of 2009.  The patient denies any intermenstrual  bleeding.  She has regular monthly menses, but does have very large  clots with her menses.   The patient had a pelvic ultrasound performed at the time of her last  hospitalization which showed a uterus enlarged to 13.5 x 6.1 x 5.6 cm.  There was an intramural myoma measuring less than 2 cm and a  subendometrial intramural myoma which measured 2.7 x 2.4 x 2.2 cm.  The  right ovary showed a small right ovarian cyst measuring about 1 cm, and  the left ovary could not be visualized.   The patient had a normal TSH in February of 2009.  She has likewise had  a negative Pap smear in May of 2009.  She had hepatitis B surface  antigen, hepatitis C antibody, HIV,  RPR, all of which were negative.  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  She was noted to have  Trichomonas in May of 2009, and was appropriately treated.  She presents  at this time requesting definitive therapy of her menorrhagia and  dysmenorrhea as well as her uterine fibroids in the way of hysterectomy.  She declines conservative  measures of submucosal myoma resection or  endometrial ablation, Depo-Provera or Depo-Lupron.   PAST MEDICAL HISTORY:  Asthma which has been intermittently controlled,  but is also intermittently exacerbated.  She was hospitalized in  November of 2009 for bronchopneumonia with asthma complicating this.  She required intubation approximately 13 years ago during an episode of  pneumonia.   The patient also has a history of anemia thought to be secondary to  menorrhagia.  She has had 2 episodes of transfusion this year; one in  May of 2009 and another in November of 2009.   The patient has been treated for bipolar disease, but has not required  medication for the last 8 years.  She does continue to suffer with  anxiety, and is followed by her primary care physician for this.  The  patient gives a history of a workup to rule out cerebrovascular  accident, and her symptoms were thought to be secondary to stress after  a negative workup  in 2000.   She does have a heart murmur thought to be a flow murmur secondary to  her anemia, and workup revealed no underlying cardiac disease.   The patient does have migraine headaches and osteoarthritis, but is on  no specific therapy for these at this time.   The patient had a fractured ankle in 1995, but required no surgery.   SURGICAL HISTORY:  The patient had a cholecystectomy in 1991 and  thyroidectomy in 1994.  She has had oral surgery for removal of all of  her top teeth.   CURRENT MEDICATIONS:  1. Synthroid 125 mcg.  2. Darvocet p.r.n.   It should be noted that at the time of discharge from her  hospitalization in November the patient was taking Advair 250/50 once  daily, fexofenadine 180 mg once daily, cyclobenzaprine 10 mg 3 times a  day, Singulair 10 mg daily, Topamax 100 mg daily, and iron 325 mg daily.  The patient states that she has discontinued all medications except her  Synthroid.   FAMILY HISTORY:  Positive for sickle cell  trait, asthma, emphysema,  epilepsy, hypertension, and cerebrovascular accident   DRUG SENSITIVITIES:  PENICILLIN and ASPIRIN.   SOCIAL HISTORY:  The patient is single.  She works for [employer] of [place].  She does not smoke cigarettes.  She does not drink alcohol.   PRIMARY CARE PHYSICIAN:  Dr. Jeri Cos.   OBSTETRICAL HISTORY:  The patient has children who are 20, 20, and 43  years old, all of whom were delivered by cesarean section for breech  presentation.  Her 43 year old was born at 77 weeks after an episode of  preterm labor.  She has had 1 abortion.  She uses tubal ligation for  contraception.   REVIEW OF SYSTEMS:  Confined to the aforementioned heavy bleeding.  The  patient currently denies any shortness of breath, chest pain, but does  have abdominal pain during her menses.   PHYSICAL EXAMINATION:  The patient is an obese black female in no acute  distress.  Temperature 98.7, pulse 88, respirations 16, blood pressure 120/80,  weight is 284 pounds, height is 5 feet, 4 inches.  Thyroid is not  enlarged.  HEART:  Regular rate and rhythm.  LUNGS:  Clear.  BREASTS:  Without dominant masses, discharge or skin changes, but are  pendulous.  ABDOMEN:  Without palpable masses, though the examination is compromised  by body habitus.  EXTREMITIES:  No clubbing, cyanosis or edema.  PELVIC:  EGBUS within normal limits.  The vagina is rugous.  The cervix  is without gross lesions.  The uterus does not feel enlarged, but the  examination is compromised by patient habitus.  It sounds to 10 cm.  No  adnexal masses are found.  RECTOVAGINAL:  Reveals no rectovaginal masses.   IMPRESSION:  1. Uterine fibroids.  2. Menorrhagia.  3. Dysmenorrhea.  4. History of anemia status post 2 transfusions in the past 12 months.  5. Severe asthma.  6. Morbid obesity.  7. PENICILLIN and ASPIRIN allergy.   DISPOSITION:  Several discussions have been held with the patient  concerning options  for management of her menorrhagia and dysmenorrhea.  She opts for definitive therapy of hysterectomy.  This will be performed  at [facility] on November 28, 2008 starting with laparoscopically-  assisted vaginal hysterectomy, but the possibility of the need for total  abdominal hysterectomy has been discussed.  The risks of anesthesia,  bleeding, infection, and damage to adjacent organs have  all been  reviewed.   The patient underwent a preoperative evaluation with pulmonary medicine  at [facility], and was cleared from a pulmonary standpoint for  her surgical procedure.      Hal Morales, M.D.  Electronically Signed     VPH/MEDQ  D:  11/27/2008  T:  11/27/2008  Job:  [id number]

## 2011-04-27 NOTE — Assessment & Plan Note (Signed)
[facility]   NAME:  Wendy Hoffman, Wendy Hoffman               ACCOUNT NO.:  [id number]   MEDICAL RECORD NO.:  [id number]      DATE OF BIRTH:  1966/04/22   PHYSICIAN:  Leonie Man, M.D.    VISIT DATE:  01/14/2009                                   OFFICE VISIT   REFERRING PHYSICIAN:  Hal Morales, MD   PRIMARY CARE PHYSICIAN:  Jarome Matin, MD   PROBLEM:  Nonhealing Pfannenstiel incision and is status post total  abdominal hysterectomy in December 2009.   HISTORY:  The patient is a 45 year old obese female with wound  dehiscence and persistent drainage postoperatively.  This has been  slightly opened and drained on several occasions by Dr. Pennie Rushing, but the  patient notes that the wound does dehisce in the center and in the left  lateral corner from time-to-time.  This has closed over the past several  days and there has been no further drainage.  The patient has no  associated chills.  She did have history of a low-grade temperature in  the past, but has been afebrile since.  She notes that the drainage is  clear without any odor.  A CT scan of the abdomen done recently showed  no evidence of an abscess.   PAST MEDICAL HISTORY/SURGICAL HISTORY:  Total abdominal hysterectomy in  2009.  This was preceded by 3 cesarean sections in 1990, 1991, and 1993.  She is status post total thyroidectomy for hyperthyroidism in 1995.  She  is currently on Synthroid, unknown dose and is clinically euthyroid.  She is status post cholecystectomy in 1991, this was an open technique  done in [place].   CURRENT MEDICATIONS:  1. Synthroid.  2. Albuterol, taken p.r.n. for asthma and chronic bronchitis.  3. She is on Cipro 250 mg b.i.d.  4. Percocet p.r.n.   She is allergic to PENICILLIN, which causes a rash and ASPIRIN, which  causes GI upset.   SOCIAL HISTORY:  A 45 year old [place] American female, she is divorced,  employed by [employer] [place].  She smokes cigarettes,  unknown quantity.  She does not use alcohol.   REVIEW OF SYSTEMS:  Positive for gastroesophageal reflux disease,  hypothyroidism, asthma, chronic bronchitis, and anxiety disorder, but  the patient does have a sickle cell trait.  The remainder of review of  systems is negative in detail.   PHYSICAL EXAMINATION:  GENERAL:  This is an obese female.  VITAL SIGNS:  She is 5 foot 4-3/4 inches.  She weighs 276 pounds.  Blood  pressure 107/63, temperature 98.3, and her pulse is 51.  HEENT AND NECK:  She is status post thyroidectomy.  Oropharynx is  benign.  No nasal obstruction.  Hearing is not grossly normal.  LUNGS:  Clear to auscultation bilaterally.  HEART:  Regular rate and rhythm.  No murmur or gallop rhythms are heard.  ABDOMEN:  A well-healed right upper quadrant Kocher incision and the  lower abdomen shows a transverse Pfannenstiel incision from her recent  hysterectomy.  The incision on the lower transverse abdomen which is the  area of concern, it seems to be well healed.  There is no evidence of  fistula.  There is no wound separation.  No erythema and there  is no  edema.  EXTREMITIES:  Range of motion within normal limits.  No clubbing or  cyanosis noted.  NEUROLOGIC:  No focal lateralizing signs.   ASSESSMENT:  Well-healed wound status post total abdominal hysterectomy  with history of dehiscence and fluid drainage.   We reassured the patient that the wound is now healed and she should not  have any further problems from this and there is no real reason for her  to be treated here.  I asked her to call us back and to follow up with  [place] if indeed the drainage recurs.      Leonie Man, M.D.  Electronically Signed     PB/MEDQ  D:  01/14/2009  T:  01/15/2009  Job:  [id number]   cc:   Hal Morales, M.D.  Jarome Matin, M.D.

## 2011-04-27 NOTE — Op Note (Signed)
[name], [name]               ACCOUNT NO.:  [id number]   MEDICAL RECORD NO.:  [id number]          PATIENT TYPE:  INP   LOCATION:  4737                         FACILITY:  [facility]   PHYSICIAN:  John C. [name], M.D.    DATE OF BIRTH:  06/04/1966   DATE OF PROCEDURE:  DATE OF DISCHARGE:  04/18/2008                               OPERATIVE REPORT   INDICATIONS FOR PROCEDURE:  Significant iron deficiency anemia.   PROCEDURE:  The patient was placed in the left lateral decubitus  position and placed on the pulse monitor with continuous low-flow oxygen  delivered by nasal cannula.  She was sedated with 50 mcg IV fentanyl and  4 mg IV Versed.  In addition, the medicine given for the previous EGD.  Olympus video colonoscope was inserted into the rectum advanced to the  cecum, confirmed by transillumination of McBurney point and  visualization of the ileocecal valve and appendiceal orifice.  The prep  was good.  The cecum ascending, transverse, descending, and sigmoid  colon all appeared normal.  There were no masses, polyps, diverticula,  or other mucosal abnormalities.  The rectum likewise appeared normal.  Retroflex view of the anus revealed no obvious internal hemorrhoids.  The scope was then withdrawn, and the patient returned to the recovery  room in stable condition.  She tolerated the procedure well and there  were no immediate complications.   IMPRESSION:  Normal colonoscopy.   PLAN:  Repeat colonoscopy in 10 years.           ______________________________  [name], M.D.     JCH/MEDQ  D:  04/18/2008  T:  04/19/2008  Job:  [id number]

## 2011-04-30 NOTE — Discharge Summary (Signed)
Wendy Hoffman, Wendy Hoffman               ACCOUNT NO.:  [id number]   MEDICAL RECORD NO.:  [id number]          PATIENT TYPE:  INP   LOCATION:  4737                         FACILITY:  [facility]   PHYSICIAN:  Ricki Rodriguez, M.D.  DATE OF BIRTH:  05/19/1966   DATE OF ADMISSION:  04/17/2008  DATE OF DISCHARGE:  04/20/2008                               DISCHARGE SUMMARY   FINAL DIAGNOSES:  1. Duodenal ulcer.  2. Adverse effect of analgesics.  3. Iron-deficiency anemia.  4. Asthma.  5. Migraine headache.  6. Diabetes mellitus.  7. Hypertension.  8. Morbid obesity.  9. Post surgical hypothyroidism.   PRINCIPAL PROCEDURE:  Small-bowel endoscopy by Dr. Dorena Cookey and  flexible fiberoptic colonoscopy by Dr. Dorena Cookey on Apr 18, 2008.   DISCHARGE MEDICATIONS:  1. Cipro 250 mg 1 twice daily for 2 days.  2. Singulair 10 mg 1 daily.  3. Advair 250/50 mg, 1 puff twice daily.  4. Levothyroxine 100 mcg daily.  5. Prilosec 20 mg 1 daily.  6. Ferrous sulfate 325 mg, 1 daily.  7. Topamax 200 mg daily.  8. Albuterol metered-dose inhaler, 2 puffs 4 times daily.  9. Nitroglycerin 0.4 mg tablet, 1 under the tongue every 5 minutes x3      as needed for chest pain.  10.Sulindac 150 mg twice daily as needed.   Follow up with Dr. Jeri Cos in 2-4 weeks.   DISCHARGE DIET:  Low-sodium heart-healthy diet.   DISCHARGE ACTIVITY:  The patient to increase activity slowly.   SPECIAL INSTRUCTION:  The patient is to stop any activity that causes  chest pain, shortness of breath, dizziness, sweating, or excessive  weakness.   HISTORY:  This is a 45 year old black female presented with right-sided  chest pain radiating to right shoulder, also had right arm and leg  tingling and numbness along with cardiac risk factors of hypertension.  Additionally, the patient has history of asthma, obesity,  hypothyroidism, and migraine headache.   PHYSICAL EXAMINATION:  VITAL SIGNS:  Temperature 98.7, respirations 16,  and blood pressure 130/85.  GENERAL:  The patient is well-developed, well-nourished African-American  female in no distress.  HEENT:  The patient is normocephalic, atraumatic, has brown eyes.  Pupils are equally reactive to light.  Conjunctiva pale, pink.  Sclerae  nonicteric.  NECK:  No JVD.  No carotid bruit.  LUNGS:  Clear bilaterally.  CHEST:  Chest wall tender on palpation in the right pectoral area.  HEART:  Normal S1 and S2.  ABDOMEN:  Soft.  EXTREMITIES:  Trace to 1+ edema.  No cyanosis or clubbing.  SKIN:  Warm and dry.  NEUROLOGIC:  Cranial nerves grossly intact.   LABORATORY DATA:  Hemoglobin 7.8, hematocrit 24, electrolytes normal,  BUN 12, and creatinine 1.0.  Urinalysis small leukocyte, myoglobin, CK-  MB, and troponin I normal.  Iron low at 26, saturation 7%, cholesterol  174, with LDL cholesterol of 107, HDL cholesterol of 37, and  triglyceride of 148.   Nuclear stress test, negative for pharmacologic stress-induced ischemia,  left ventricle ejection fraction of 48%.   Chest x-ray:  No acute chest process.   CT of the head normal.   EKG:  Normal sinus rhythm with nonspecific ST abnormality.  Echocardiogram revealed normal LV systolic function   HOSPITAL COURSE:  The patient was admitted to telemetry unit.  Myocardial infarction was ruled out.  She had a drop in hemoglobin,  hence GI consult was obtained.  The patient had upper and lower  endoscopy.  Duodenal errosion was found. Colonoscopy was unremarkable.  The patient was started on proton pump inhibitor.  She received 2 units  of packed RBC, this improved her hemoglobin to 9.6. She was started on  Cipro for urinary tract infection.  Her overall condition gradually  improved. Some of her chest pain could be secondary to anemia.  With a  nuclear stress test negative for ischemia, the patient was discharged  home in satisfactory condition with followup by primary care physician  in 2-4 weeks.      Ricki Rodriguez, M.D.  Electronically Signed     ASK/MEDQ  D:  05/24/2008  T:  05/25/2008  Job:  [id number]

## 2011-04-30 NOTE — Discharge Summary (Signed)
NAMEAFTAN, VINT               ACCOUNT NO.:  [id number]   MEDICAL RECORD NO.:  [id number]          PATIENT TYPE:  AMB   LOCATION:  SDC                           [facility]   PHYSICIAN:  Hal Morales, M.D.DATE OF BIRTH:  03-18-1966   DATE OF ADMISSION:  01/03/2009  DATE OF DISCHARGE:  01/03/2009                               DISCHARGE SUMMARY   DISCHARGE DIAGNOSES:  1. Incisional granulomatous tissue.  2. Status post abdominal hysterectomy.   OPERATION:  On the date of admission, the patient underwent examination  under anesthesia of her operative incision followed by an excision of  granulomatous tissue from the left pole of the same.  Findings showed  that the patient had an abdominal incision that was completely intact.  A previously noted pinpoint defect in the midline incision that had been  allowing for serous drainage was completely closed.  No drainage could  be appreciated or reproduced with pressure along the entire surface of  the incision.  No pockets of fluid were palpable which was consistent  with the CT scan the patient had earlier in the day.  At the left pole  of the incision, there was a 1 cm area of granulomatous tissue noted and  was subsequently excised.  The cut end was cauterized and hemostasis  noted to be adequate.  No further pockets of fluid to be appreciated and  no fluid could be aspirated upon placement of a spinal needle in the  incision.   HISTORY OF PRESENT ILLNESS:  Ms. Wendy Hoffman is a 45 year old female, who  underwent a total abdominal hysterectomy on December 08, 2008, who  reported having drainage from her incision.  The patient was noted to  have a seroma December 26, 2008, which was drained; however, the patient  continued to report drainage from that site.  Please see the patient's  history and physical details on her chart.   PREOPERATIVE PHYSICAL EXAMINATION:  VITAL SIGNS:  Blood pressure 138/80,  pulse 80, respirations 16, and  temperature 97.6 degrees Fahrenheit  orally.  ABDOMEN:  A nontender incision with a pinpoint site of drainage in the  middle of incision.  The left pole of the incision revealed granulation  tissue.   The patient had a CT scan done January 02, 2009, which did not reveal  any pelvic masses.  The patient was admitted for further evaluation of  her symptoms.   HOSPITAL COURSE:  On date of admission, the patient underwent  aforementioned procedures, tolerating it well.  She was discharged  following this procedure to home in good condition.  The patient's  discharge labs, her hemoglobin was 8.7, hematocrit 27.1.   DISCHARGE MEDICATIONS:  The patient was directed to her home medication  reconciliation form.  She will follow up.  The patient is scheduled for  followup appointment with Dr. Pennie Rushing on January 08, 2009.   DISCHARGE INSTRUCTIONS:  The patient was advised to keep her incision  clean and dry, and to dry daily with a blow-dryer.  The patient had no  restrictions of her activity.  Her diet was without  restriction and  there was no final pathology.      [place] J. Adline Peals.      Hal Morales, M.D.  Electronically Signed    EJP/MEDQ  D:  03/05/2009  T:  03/05/2009  Job:  [id number]

## 2011-09-14 LAB — DIFFERENTIAL
Basophils Absolute: 0
Basophils Absolute: 0
Basophils Relative: 0
Basophils Relative: 0
Basophils Relative: 0
Eosinophils Absolute: 0
Eosinophils Absolute: 0
Eosinophils Absolute: 0.1
Eosinophils Absolute: 0.2
Eosinophils Relative: 2
Eosinophils Relative: 3
Lymphocytes Relative: 22
Lymphocytes Relative: 28
Lymphocytes Relative: 29
Lymphocytes Relative: 31
Lymphs Abs: 1.3
Lymphs Abs: 1.4
Lymphs Abs: 1.7
Monocytes Absolute: 0.4
Monocytes Absolute: 0.4
Monocytes Absolute: 0.5
Monocytes Absolute: 0.6
Monocytes Absolute: 0.6
Monocytes Relative: 6
Monocytes Relative: 7
Neutro Abs: 3.4
Neutro Abs: 4.1
Neutro Abs: 5.5
Neutrophils Relative %: 58
Neutrophils Relative %: 59
Neutrophils Relative %: 61
Neutrophils Relative %: 63
Neutrophils Relative %: 87 — ABNORMAL HIGH

## 2011-09-14 LAB — RENAL FUNCTION PANEL
Albumin: 3.1 — ABNORMAL LOW
Albumin: 3.2 — ABNORMAL LOW
CO2: 18 — ABNORMAL LOW
CO2: 22
Calcium: 8.6
Calcium: 8.8
Chloride: 113 — ABNORMAL HIGH
Chloride: 113 — ABNORMAL HIGH
Creatinine, Ser: 0.68
Creatinine, Ser: 0.75
Creatinine, Ser: 0.88
GFR calc Af Amer: >60
GFR calc Af Amer: >60
GFR calc Af Amer: >60
GFR calc non Af Amer: >60
GFR calc non Af Amer: >60
GFR calc non Af Amer: >60
Glucose, Bld: 89
Potassium: 3.7
Sodium: 139

## 2011-09-14 LAB — COMPREHENSIVE METABOLIC PANEL
ALT: 13
Albumin: 3 — ABNORMAL LOW
Alkaline Phosphatase: 51
Alkaline Phosphatase: 51
BUN: 3 — ABNORMAL LOW
BUN: 6
BUN: 7
CO2: 21
Calcium: 8.5
Calcium: 8.8
Chloride: 107
Creatinine, Ser: 0.74
Creatinine, Ser: 0.79
GFR calc non Af Amer: >60
Glucose, Bld: 89
Glucose, Bld: 89
Glucose, Bld: 92
Potassium: 3.5
Potassium: 3.6
Total Bilirubin: 0.9
Total Protein: 6
Total Protein: 6.2

## 2011-09-14 LAB — PROTIME-INR
INR: 1
Prothrombin Time: 13.8

## 2011-09-14 LAB — CBC
HCT: 26.8 — ABNORMAL LOW
HCT: 27.2 — ABNORMAL LOW
HCT: 27.9 — ABNORMAL LOW
HCT: 29.2 — ABNORMAL LOW
Hemoglobin: 8.6 — ABNORMAL LOW
Hemoglobin: 8.8 — ABNORMAL LOW
Hemoglobin: 9.2 — ABNORMAL LOW
MCHC: 31
MCHC: 31.4
MCHC: 31.6
MCHC: 32
MCHC: 32.1
MCHC: 32.8
MCV: 66.7 — ABNORMAL LOW
MCV: 69.9 — ABNORMAL LOW
MCV: 70.1 — ABNORMAL LOW
MCV: 71 — ABNORMAL LOW
Platelets: 237
RBC: 3.82 — ABNORMAL LOW
RBC: 3.89
RBC: 3.92
RBC: 3.97
RBC: 4.07
RDW: 18.9 — ABNORMAL HIGH
RDW: 20.3 — ABNORMAL HIGH
RDW: 20.6 — ABNORMAL HIGH
RDW: 21.3 — ABNORMAL HIGH
RDW: 21.4 — ABNORMAL HIGH

## 2011-09-14 LAB — TYPE AND SCREEN: ABO/RH(D): A POS

## 2011-09-14 LAB — LIPID PANEL
Total CHOL/HDL Ratio: 3.9
Triglycerides: 155 — ABNORMAL HIGH
VLDL: 31

## 2011-09-14 LAB — IRON AND TIBC
Iron: 14 — ABNORMAL LOW
Saturation Ratios: 4 — ABNORMAL LOW
TIBC: 374
UIBC: 360

## 2011-09-14 LAB — CEA: CEA: <0.5

## 2011-09-14 LAB — RETICULOCYTES
RBC.: 4.19
Retic Ct Pct: 0.2 — ABNORMAL LOW

## 2011-09-14 LAB — URINE CULTURE: Colony Count: >=100000

## 2011-09-14 LAB — CARDIAC PANEL(CRET KIN+CKTOT+MB+TROPI)
CK, MB: 0.5
Relative Index: 0.4
Total CK: 129

## 2011-09-14 LAB — GLUCOSE, CAPILLARY
Glucose-Capillary: 101 — ABNORMAL HIGH
Glucose-Capillary: 108 — ABNORMAL HIGH
Glucose-Capillary: 95

## 2011-09-14 LAB — POCT I-STAT, CHEM 8
Calcium, Ion: 1.1 — ABNORMAL LOW
Chloride: 103
HCT: 25 — ABNORMAL LOW
Hemoglobin: 8.5 — ABNORMAL LOW
Potassium: 3.7

## 2011-09-14 LAB — CULTURE, BLOOD (ROUTINE X 2)
Culture: NO GROWTH
Culture: NO GROWTH

## 2011-09-14 LAB — FERRITIN: Ferritin: 31 (ref 10–291)

## 2011-09-14 LAB — TSH: TSH: 2.036

## 2011-09-14 LAB — CK TOTAL AND CKMB (NOT AT ARMC): Total CK: 92

## 2011-09-17 LAB — DIFFERENTIAL
Eosinophils Absolute: 0 10*3/uL (ref 0.0–0.7)
Lymphs Abs: 1.1 10*3/uL (ref 0.7–4.0)
Monocytes Absolute: 0.5 10*3/uL (ref 0.1–1.0)
Neutrophils Relative %: 85 % — ABNORMAL HIGH (ref 43–77)

## 2011-09-17 LAB — CBC
Hemoglobin: 7.7 g/dL — CL (ref 12.0–15.0)
Hemoglobin: 7.7 g/dL — CL (ref 12.0–15.0)
MCHC: 31.1 g/dL (ref 30.0–36.0)
Platelets: 343 10*3/uL (ref 150–400)
Platelets: 442 10*3/uL — ABNORMAL HIGH (ref 150–400)
RBC: 3.36 MIL/uL — ABNORMAL LOW (ref 3.87–5.11)
RDW: 20.4 % — ABNORMAL HIGH (ref 11.5–15.5)
RDW: 21.5 % — ABNORMAL HIGH (ref 11.5–15.5)
WBC: 10.9 10*3/uL — ABNORMAL HIGH (ref 4.0–10.5)

## 2011-09-17 LAB — URINALYSIS, ROUTINE W REFLEX MICROSCOPIC
Bilirubin Urine: NEGATIVE
Glucose, UA: NEGATIVE mg/dL
Ketones, ur: NEGATIVE mg/dL
Leukocytes, UA: NEGATIVE
pH: 6 (ref 5.0–8.0)

## 2011-09-17 LAB — COMPREHENSIVE METABOLIC PANEL
ALT: 11 U/L (ref 0–35)
Alkaline Phosphatase: 47 U/L (ref 39–117)
CO2: 27 mEq/L (ref 19–32)
Chloride: 101 mEq/L (ref 96–112)
GFR calc non Af Amer: >60 mL/min (ref >60)
Glucose, Bld: 115 mg/dL — ABNORMAL HIGH (ref 70–99)
Potassium: 3.4 mEq/L — ABNORMAL LOW (ref 3.5–5.1)
Sodium: 136 mEq/L (ref 135–145)
Total Bilirubin: 0.5 mg/dL (ref 0.3–1.2)

## 2011-09-17 LAB — URINE MICROSCOPIC-ADD ON

## 2011-09-17 LAB — TYPE AND SCREEN
ABO/RH(D): A POS
Antibody Screen: NEGATIVE

## 2011-09-17 LAB — T3, FREE: T3, Free: 2.3 pg/mL (ref 2.3–4.2)

## 2011-09-17 LAB — T4, FREE: Free T4: 1.11 ng/dL (ref 0.89–1.80)

## 2011-12-11 ENCOUNTER — Emergency Department (HOSPITAL_COMMUNITY)
Admission: EM | Admit: 2011-12-11 | Discharge: 2011-12-12 | Disposition: A | Discharge status: Home / Self Care | Payer: Medicaid Other | Attending: Emergency Medicine | Admitting: Emergency Medicine

## 2011-12-11 ENCOUNTER — Encounter: Payer: Self-pay | Admitting: *Deleted

## 2011-12-11 DIAGNOSIS — Z79899 Other long term (current) drug therapy: Secondary | ICD-10-CM | POA: Insufficient documentation

## 2011-12-11 DIAGNOSIS — R6889 Other general symptoms and signs: Secondary | ICD-10-CM | POA: Insufficient documentation

## 2011-12-11 DIAGNOSIS — I10 Essential (primary) hypertension: Secondary | ICD-10-CM | POA: Insufficient documentation

## 2011-12-11 DIAGNOSIS — G43909 Migraine, unspecified, not intractable, without status migrainosus: Secondary | ICD-10-CM | POA: Insufficient documentation

## 2011-12-11 DIAGNOSIS — J45909 Unspecified asthma, uncomplicated: Secondary | ICD-10-CM | POA: Insufficient documentation

## 2011-12-11 DIAGNOSIS — T7840XA Allergy, unspecified, initial encounter: Secondary | ICD-10-CM | POA: Insufficient documentation

## 2011-12-11 DIAGNOSIS — R220 Localized swelling, mass and lump, head: Secondary | ICD-10-CM | POA: Insufficient documentation

## 2011-12-11 DIAGNOSIS — E079 Disorder of thyroid, unspecified: Secondary | ICD-10-CM | POA: Insufficient documentation

## 2011-12-11 DIAGNOSIS — R000 Tachycardia, unspecified: Secondary | ICD-10-CM | POA: Insufficient documentation

## 2011-12-11 DIAGNOSIS — R0602 Shortness of breath: Secondary | ICD-10-CM | POA: Insufficient documentation

## 2011-12-11 HISTORY — DX: Disorder of thyroid, unspecified: E07.9

## 2011-12-11 MED ORDER — FAMOTIDINE IN NACL 20-0.9 MG/50ML-% IV SOLN
20.0000 mg | Freq: Once | INTRAVENOUS | Status: AC
  Administered 2011-12-11: 20 mg via INTRAVENOUS
  Filled 2011-12-11: qty 50

## 2011-12-11 MED ORDER — MORPHINE SULFATE 4 MG/ML IJ SOLN
4.0000 mg | Freq: Once | INTRAMUSCULAR | Status: AC
  Administered 2011-12-11: 4 mg via INTRAVENOUS
  Filled 2011-12-11: qty 1

## 2011-12-11 MED ORDER — SODIUM CHLORIDE 0.9 % IV SOLN
Freq: Once | INTRAVENOUS | Status: AC
  Administered 2011-12-11: 22:00:00 via INTRAVENOUS

## 2011-12-11 MED ORDER — ALBUTEROL SULFATE (5 MG/ML) 0.5% IN NEBU
INHALATION_SOLUTION | RESPIRATORY_TRACT | Status: AC
  Administered 2011-12-11: 22:00:00
  Filled 2011-12-11: qty 1

## 2011-12-11 MED ORDER — DIPHENHYDRAMINE HCL 50 MG/ML IJ SOLN
25.0000 mg | Freq: Once | INTRAMUSCULAR | Status: AC
  Administered 2011-12-11: 25 mg via INTRAVENOUS
  Filled 2011-12-11: qty 1

## 2011-12-11 MED ORDER — METHYLPREDNISOLONE SODIUM SUCC 125 MG IJ SOLR
125.0000 mg | Freq: Once | INTRAMUSCULAR | Status: AC
  Administered 2011-12-11: 125 mg via INTRAVENOUS
  Filled 2011-12-11: qty 2

## 2011-12-11 NOTE — ED Notes (Signed)
Pt reports approx 2200-2100 she was eating chicken w/ curry powder on it, pt has eaten this before - shortly after eating pt noted to have difficulty swallowing and felt short of breath. Pt brought via EMS - Earley Favor, FNP in to see pt on arrival.

## 2011-12-11 NOTE — ED Notes (Signed)
[facility]:[id number]<FT> Expected date:12/11/11<BR> Expected time: 9:52 PM<BR> Means of arrival:Ambulance<BR> Comments:<BR> M212 - 45yoF reaction to curry, coughing, stridor.  Neb tx

## 2011-12-11 NOTE — ED Notes (Signed)
Per EMS - pt was eating chicken w/ curry powder on it approx 1hr ago - pt then later began having sensation that her throat was swelling and shortness of breath. Pt w/o hives or rash. Pt given alb neb tx by EMS en route.

## 2011-12-11 NOTE — ED Provider Notes (Signed)
History     CSN: [id number]  Arrival date & time 12/11/11  2153   First MD Initiated Contact with Patient 12/11/11 2204      Chief Complaint  Patient presents with  . Oral Swelling  . Shortness of Breath    (Consider location/radiation/quality/duration/timing/severity/associated sxs/prior treatment) HPI Comments: Patient developed acute onset of throat swelling, shortness of breath while eating chicken that she prepared at home with her.  He spikes.  She has used this spice  in the past, but never had a reaction.  EMS was called they administered albuterol with minimal to relief  Patient is a 45 y.o. female presenting with shortness of breath. The history is provided by the patient.  Shortness of Breath  The current episode started today. The problem is moderate. The symptoms are relieved by nothing. Associated symptoms include shortness of breath. Pertinent negatives include no chest pain, no fever, no rhinorrhea and no wheezing. The intake occurred while eating. Her past medical history does not include asthma or bronchiolitis.    Past Medical History  Diagnosis Date  . Asthma   . Thyroid disease     No past surgical history on file.  No family history on file.  History  Substance Use Topics  . Smoking status: Not on file  . Smokeless tobacco: Not on file  . Alcohol Use:     OB History    Grav Para Term Preterm Abortions TAB SAB Ect Mult Living                  Review of Systems  Constitutional: Negative.  Negative for fever.  HENT: Negative for rhinorrhea.   Respiratory: Positive for choking and shortness of breath. Negative for wheezing.   Cardiovascular: Negative for chest pain.  Skin: Negative.   Neurological: Negative for dizziness.  Psychiatric/Behavioral: Negative for agitation.    Allergies  Aspirin and Penicillins  Home Medications   Current Outpatient Rx  Name Route Sig Dispense Refill  . ALBUTEROL SULFATE HFA 108 (90 BASE) MCG/ACT IN AERS  Inhalation Inhale 2 puffs into the lungs every 6 (six) hours as needed. Shortness of breath     . FERROUS FUMARATE 325 (106 FE) MG PO TABS Oral Take 1 tablet by mouth.      Marland Kitchen FLUTICASONE-SALMETEROL 250-50 MCG/DOSE IN AEPB Inhalation Inhale 1 puff into the lungs every 12 (twelve) hours.      Marland Kitchen LEVOTHYROXINE SODIUM 100 MCG PO TABS Oral Take 100 mcg by mouth daily.      Marland Kitchen MONTELUKAST SODIUM 10 MG PO TABS Oral Take 10 mg by mouth at bedtime.      Marland Kitchen NITROGLYCERIN 0.4 MG SL SUBL Sublingual Place 0.4 mg under the tongue every 5 (five) minutes as needed.      Marland Kitchen OMEPRAZOLE 20 MG PO CPDR Oral Take 20 mg by mouth daily as needed. gerd     . OXYCODONE-ACETAMINOPHEN 5-500 MG PO CAPS Oral Take 1 capsule by mouth every 4 (four) hours as needed. Migraines     . ROSUVASTATIN CALCIUM 10 MG PO TABS Oral Take 10 mg by mouth daily.      . SERTRALINE HCL 50 MG PO TABS Oral Take 50 mg by mouth daily.      . TOPIRAMATE 200 MG PO TABS Oral Take 200 mg by mouth daily.      Marland Kitchen FAMOTIDINE 20 MG PO TABS Oral Take 1 tablet (20 mg total) by mouth 2 (two) times daily. 30 tablet 0  .  PREDNISONE 10 MG PO TABS Oral Take 2 tablets (20 mg total) by mouth daily. 15 tablet 0    BP 123/92  Pulse 102  Temp(Src) 98.6 F (37 C) (Oral)  Resp 20  SpO2 97%  Physical Exam  Constitutional: She is oriented to person, place, and time. She appears well-developed and well-nourished.  HENT:  Mouth/Throat: Posterior oropharyngeal edema present.  Neck: Normal range of motion.  Cardiovascular: Tachycardia present.   Pulmonary/Chest: Breath sounds normal. She has no wheezes.  Musculoskeletal: Normal range of motion.  Neurological: She is oriented to person, place, and time.  Skin: Skin is warm and dry.    ED Course  Procedures (including critical care time)  Labs Reviewed - No data to display No results found.   1. Allergic reaction   2. Migraine headache    No longer stridorous breathing easier, but now reports migraine,  migraine headache requesting  to be medicated for that On reassessment lungs are clear.  Patient speaking in full sentences.  States she feels much better, better.  Will discharge him with short course of steroids  MDM  Acute allergic reaction         Arman Filter, NP 12/11/11 2224  Arman Filter, NP 12/11/11 2302  Arman Filter, NP 12/12/11 1478  Arman Filter, NP 12/12/11 0230

## 2011-12-12 MED ORDER — FAMOTIDINE 20 MG PO TABS: 20.0000 mg | ORAL_TABLET | Freq: Two times a day (BID) | ORAL | Status: DC

## 2011-12-12 MED ORDER — MORPHINE SULFATE 4 MG/ML IJ SOLN
4.0000 mg | Freq: Once | INTRAMUSCULAR | Status: AC
  Administered 2011-12-12: 4 mg via INTRAVENOUS
  Filled 2011-12-12: qty 1

## 2011-12-12 MED ORDER — ALBUTEROL SULFATE (5 MG/ML) 0.5% IN NEBU
2.5000 mg | INHALATION_SOLUTION | Freq: Once | RESPIRATORY_TRACT | Status: AC
  Administered 2011-12-12: 2.5 mg via RESPIRATORY_TRACT
  Filled 2011-12-12: qty 0.5

## 2011-12-12 MED ORDER — PREDNISONE 10 MG PO TABS: 20.0000 mg | ORAL_TABLET | Freq: Every day | ORAL | Status: DC

## 2011-12-12 NOTE — ED Notes (Signed)
Rx given x2 D/c instructions reviewed w/ pt and family - pt and family deny any further questions or concerns at present.  

## 2011-12-13 NOTE — ED Provider Notes (Signed)
History/physical exam/procedure(s) were performed by non-physician practitioner and as supervising physician I was immediately available for consultation/collaboration. I have reviewed all notes and am in agreement with care and plan. Patient did not "spike" but used curry spices  Hilario Quarry, MD 12/13/11 [phone]

## 2012-04-21 ENCOUNTER — Emergency Department (INDEPENDENT_AMBULATORY_CARE_PROVIDER_SITE_OTHER): Payer: Self-pay

## 2012-04-21 ENCOUNTER — Emergency Department (INDEPENDENT_AMBULATORY_CARE_PROVIDER_SITE_OTHER)
Admission: EM | Admit: 2012-04-21 | Discharge: 2012-04-21 | Disposition: A | Discharge status: Home / Self Care | Payer: Self-pay | Source: Home / Self Care | Attending: Family Medicine | Admitting: Family Medicine

## 2012-04-21 ENCOUNTER — Encounter (HOSPITAL_COMMUNITY): Payer: Self-pay

## 2012-04-21 DIAGNOSIS — S60229A Contusion of unspecified hand, initial encounter: Secondary | ICD-10-CM

## 2012-04-21 DIAGNOSIS — M25561 Pain in right knee: Secondary | ICD-10-CM

## 2012-04-21 DIAGNOSIS — M25569 Pain in unspecified knee: Secondary | ICD-10-CM

## 2012-04-21 DIAGNOSIS — S60221A Contusion of right hand, initial encounter: Secondary | ICD-10-CM

## 2012-04-21 HISTORY — DX: Essential (primary) hypertension: I10

## 2012-04-21 HISTORY — DX: Pure hypercholesterolemia, unspecified: E78.00

## 2012-04-21 MED ORDER — CELECOXIB 100 MG PO CAPS: 100.0000 mg | ORAL_CAPSULE | Freq: Two times a day (BID) | ORAL | Status: AC

## 2012-04-21 MED ORDER — HYDROCODONE-ACETAMINOPHEN 5-325 MG PO TABS
1.0000 | ORAL_TABLET | Freq: Once | ORAL | Status: AC
  Administered 2012-04-21: 1 via ORAL

## 2012-04-21 MED ORDER — HYDROCODONE-ACETAMINOPHEN 5-500 MG PO TABS: 1.0000 | ORAL_TABLET | Freq: Four times a day (QID) | ORAL | Status: AC | PRN

## 2012-04-21 MED ORDER — HYDROCODONE-ACETAMINOPHEN 5-325 MG PO TABS
ORAL_TABLET | ORAL | Status: AC
  Filled 2012-04-21: qty 1

## 2012-04-21 NOTE — ED Notes (Signed)
States she fell earlier today, and landed on both knees and right hand , pain 3rd/4th/5th fingers PIP joint; NAD

## 2012-04-21 NOTE — ED Provider Notes (Signed)
History     CSN: [id number]  Arrival date & time 04/21/12  1478   First MD Initiated Contact with Patient 04/21/12 1856      Chief Complaint  Patient presents with  . Fall    (Consider location/radiation/quality/duration/timing/severity/associated sxs/prior treatment) HPI Comments: 46 year old female obese with history of hypothyroid disease among other comorbidities. Here complaining of bilateral knee pain worse on the right side and right hand pain after a fall 2 hours ago. Patient states she was loading a moving truck when she turned and accidentally fell from her porch down to the concrete floor about 2 feet high, landing on her knees first on the right side and crushing right hand against the floor. Reports her right knee is very tender and unable to bend. She's able to bear weight on the left side but reports discomfort. Denies syncope palpitation head injury, loss of consciousness or dizziness.    Past Medical History  Diagnosis Date  . Asthma   . Thyroid disease   . Hypertension   . High cholesterol     History reviewed. No pertinent past surgical history.  History reviewed. No pertinent family history.  History  Substance Use Topics  . Smoking status: Never Smoker   . Smokeless tobacco: Not on file  . Alcohol Use: No    OB History    Grav Para Term Preterm Abortions TAB SAB Ect Mult Living                  Review of Systems  All other systems reviewed and are negative.    Allergies  Aspirin and Penicillins  Home Medications   Current Outpatient Rx  Name Route Sig Dispense Refill  . FAMOTIDINE 20 MG PO TABS Oral Take 1 tablet (20 mg total) by mouth 2 (two) times daily. 30 tablet 0  . FERROUS FUMARATE 325 (106 FE) MG PO TABS Oral Take 1 tablet by mouth.      Marland Kitchen FLUTICASONE-SALMETEROL 250-50 MCG/DOSE IN AEPB Inhalation Inhale 1 puff into the lungs every 12 (twelve) hours.      Marland Kitchen LEVOTHYROXINE SODIUM 100 MCG PO TABS Oral Take 100 mcg by mouth daily.        Marland Kitchen MONTELUKAST SODIUM 10 MG PO TABS Oral Take 10 mg by mouth at bedtime.      Marland Kitchen NITROGLYCERIN 0.4 MG SL SUBL Sublingual Place 0.4 mg under the tongue every 5 (five) minutes as needed.      . SERTRALINE HCL 50 MG PO TABS Oral Take 50 mg by mouth daily.      . TOPIRAMATE 200 MG PO TABS Oral Take 200 mg by mouth daily.      . ALBUTEROL SULFATE HFA 108 (90 BASE) MCG/ACT IN AERS Inhalation Inhale 2 puffs into the lungs every 6 (six) hours as needed. Shortness of breath     . CELECOXIB 100 MG PO CAPS Oral Take 1 capsule (100 mg total) by mouth 2 (two) times daily. 20 capsule 0  . HYDROCODONE-ACETAMINOPHEN 5-500 MG PO TABS Oral Take 1 tablet by mouth every 6 (six) hours as needed for pain. 15 tablet 0  . OMEPRAZOLE 20 MG PO CPDR Oral Take 20 mg by mouth daily as needed. gerd     . PREDNISONE 10 MG PO TABS Oral Take 2 tablets (20 mg total) by mouth daily. 15 tablet 0  . ROSUVASTATIN CALCIUM 10 MG PO TABS Oral Take 10 mg by mouth daily.        BP 149/92  Pulse 82  Temp(Src) 98.6 F (37 C) (Oral)  Resp 18  SpO2 98%  Physical Exam  Nursing note and vitals reviewed. Constitutional: She is oriented to person, place, and time. She appears well-developed and well-nourished. No distress.       Morbidly obese  HENT:  Head: Normocephalic and atraumatic.  Eyes: EOM are normal. Pupils are equal, round, and reactive to light.  Neck: Normal range of motion. Neck supple.  Cardiovascular: Normal rate, regular rhythm and normal heart sounds.   Pulmonary/Chest: Breath sounds normal.  Musculoskeletal:       Right knee: difficult exam due to body habitus. Tender to palpation diffusely. Reported severe pain with flexion. No redness or bruising. No obvious effusion. No hyperlaxity. No focal tenderness or bruising over tibial bone.  Left knee: weight bearing with reported discomfort. No obvious swelling redness or bruising. Diffused tenderness with palpation. Fair range of motion. No hyperlaxity.   Right hand:  several artificial nails broken. No obvious deformity. Reported tenderness over distal phalange in 3rd, 4th and 5th fingers, normal DIPJ active range of motion. Metacarpal bones appear well aligned. No tenderness over carpal bones. Right: fore arm normal exam with no deformity, swelling, bruising or focal tenderness.  Neurological: She is alert and oriented to person, place, and time.       Right upper extremity and bilateral lower extremities appear neurovascularly intact.  Skin:       No bruising, abrasions, hematoma or lacerations.    ED Course  Procedures (including critical care time)  Labs Reviewed - No data to display Dg Knee Complete 4 Views Right  04/21/2012  *RADIOLOGY REPORT*  Clinical Data: Fall  RIGHT KNEE - COMPLETE 4+ VIEW  Comparison: None.  Findings: Four views of the right knee submitted.  No acute fracture or subluxation.  Mild narrowing of medial joint compartment.  Small joint effusion.  Narrowing of patellofemoral joint space.  IMPRESSION: No acute fracture or subluxation.  Small joint effusion. Degenerative changes as described above.  Original Report Authenticated By: Natasha Mead, M.D.   Dg Hand Complete Right  04/21/2012  *RADIOLOGY REPORT*  Clinical Data: Fall  RIGHT HAND - COMPLETE 3+ VIEW  Comparison: 06/25/2009  Findings: Three views of the right hand submitted.  No acute fracture or subluxation.  No radiopaque foreign body.  IMPRESSION: No acute fracture or subluxation.  Original Report Authenticated By: Natasha Mead, M.D.     1. Bilateral knee pain   2. Contusion of right hand       MDM  No Fx. On Xrays. Small effusion on right knee. Placed knee immobilizer, crutches, symptomatic treatment. Follow up with orthopedic.         Sharin Grave, MD 04/22/12 1945

## 2012-04-21 NOTE — Discharge Instructions (Signed)
There are no fractures on your x-rays. Keep knee brace in place and use crutches while your knee is still painful. That remove at least 3 times a day for her knee rehabilitation exercises. Take the prescribed medications as instructed. Followup with the orthopedic doctor number provided above if persistent symptoms after 1 week or earlier if worsening symptoms despite following treatment. Otherwise followup with your primary care provider to monitor your symptoms.

## 2012-07-15 ENCOUNTER — Encounter (HOSPITAL_COMMUNITY): Payer: Self-pay | Admitting: *Deleted

## 2012-07-15 DIAGNOSIS — R51 Headache: Secondary | ICD-10-CM | POA: Insufficient documentation

## 2012-07-15 DIAGNOSIS — J45909 Unspecified asthma, uncomplicated: Secondary | ICD-10-CM | POA: Insufficient documentation

## 2012-07-15 DIAGNOSIS — I10 Essential (primary) hypertension: Secondary | ICD-10-CM | POA: Insufficient documentation

## 2012-07-15 DIAGNOSIS — E079 Disorder of thyroid, unspecified: Secondary | ICD-10-CM | POA: Insufficient documentation

## 2012-07-15 DIAGNOSIS — E7800 Pure hypercholesterolemia, unspecified: Secondary | ICD-10-CM | POA: Insufficient documentation

## 2012-07-15 NOTE — ED Notes (Signed)
The pt has had a headache for 2 days with dizziness and nausea.

## 2012-07-16 ENCOUNTER — Emergency Department (HOSPITAL_COMMUNITY)
Admission: EM | Admit: 2012-07-16 | Discharge: 2012-07-16 | Disposition: A | Discharge status: Home / Self Care | Payer: Managed Care, Other (non HMO) | Attending: Emergency Medicine | Admitting: Emergency Medicine

## 2012-07-16 DIAGNOSIS — R51 Headache: Secondary | ICD-10-CM

## 2012-07-16 MED ORDER — DEXAMETHASONE SODIUM PHOSPHATE 10 MG/ML IJ SOLN
10.0000 mg | Freq: Once | INTRAMUSCULAR | Status: AC
  Administered 2012-07-16: 10 mg via INTRAVENOUS
  Filled 2012-07-16: qty 1

## 2012-07-16 MED ORDER — HYDROMORPHONE HCL PF 1 MG/ML IJ SOLN
1.0000 mg | Freq: Once | INTRAMUSCULAR | Status: AC
  Administered 2012-07-16: 1 mg via INTRAVENOUS
  Filled 2012-07-16: qty 1

## 2012-07-16 MED ORDER — METOCLOPRAMIDE HCL 5 MG/ML IJ SOLN
10.0000 mg | Freq: Once | INTRAMUSCULAR | Status: AC
  Administered 2012-07-16: 10 mg via INTRAVENOUS
  Filled 2012-07-16: qty 2

## 2012-07-16 MED ORDER — ALBUTEROL SULFATE (5 MG/ML) 0.5% IN NEBU
INHALATION_SOLUTION | RESPIRATORY_TRACT | Status: AC
  Filled 2012-07-16: qty 0.5

## 2012-07-16 MED ORDER — KETOROLAC TROMETHAMINE 30 MG/ML IJ SOLN
30.0000 mg | Freq: Once | INTRAMUSCULAR | Status: AC
  Administered 2012-07-16: 30 mg via INTRAVENOUS
  Filled 2012-07-16: qty 1

## 2012-07-16 MED ORDER — DIPHENHYDRAMINE HCL 50 MG/ML IJ SOLN
25.0000 mg | Freq: Once | INTRAMUSCULAR | Status: AC
  Administered 2012-07-16: 25 mg via INTRAVENOUS
  Filled 2012-07-16: qty 1

## 2012-07-16 MED ORDER — METHOCARBAMOL 100 MG/ML IJ SOLN: 1000.0000 mg | Freq: Once | INTRAMUSCULAR | Status: DC

## 2012-07-16 MED ORDER — ALBUTEROL SULFATE (5 MG/ML) 0.5% IN NEBU
2.5000 mg | INHALATION_SOLUTION | Freq: Once | RESPIRATORY_TRACT | Status: AC
  Administered 2012-07-16: 2.5 mg via RESPIRATORY_TRACT

## 2012-07-16 MED ORDER — CYCLOBENZAPRINE HCL 10 MG PO TABS: 10.0000 mg | ORAL_TABLET | Freq: Three times a day (TID) | ORAL | Status: AC | PRN

## 2012-07-16 MED ORDER — SODIUM CHLORIDE 0.9 % IV BOLUS (SEPSIS)
1000.0000 mL | Freq: Once | INTRAVENOUS | Status: AC
  Administered 2012-07-16: 1000 mL via INTRAVENOUS

## 2012-07-16 MED ORDER — OXYCODONE-ACETAMINOPHEN 5-325 MG PO TABS: 1.0000 | ORAL_TABLET | ORAL | Status: AC | PRN

## 2012-07-16 MED ORDER — METHOCARBAMOL 100 MG/ML IJ SOLN
1000.0000 mg | Freq: Once | INTRAMUSCULAR | Status: AC
  Administered 2012-07-16: 1000 mg via INTRAVENOUS
  Filled 2012-07-16: qty 10

## 2012-07-16 MED ORDER — SODIUM CHLORIDE 0.9 % IV SOLN
Freq: Once | INTRAVENOUS | Status: AC
  Administered 2012-07-16: 05:00:00 via INTRAVENOUS

## 2012-07-16 NOTE — ED Notes (Signed)
Patient stated that her headache is better. 

## 2012-07-16 NOTE — ED Notes (Signed)
Pt states he has had HA on and off for the past 2 weeks.  States pain "moves around in her head".  Photophobia. Pt also states she has a pressure in her chest "like when I'm having an asthma attack", but pt not SOB at this time.

## 2012-07-16 NOTE — ED Provider Notes (Signed)
History     CSN: [id number]  Arrival date & time 07/15/12  2252   First MD Initiated Contact with Patient 07/16/12 0159      Chief Complaint  Patient presents with  . Headache    (Consider location/radiation/quality/duration/timing/severity/associated sxs/prior treatment) Patient is a 46 y.o. female presenting with headaches. The history is provided by the patient.  Headache   She has been having a severe bifrontal and bitemporal headache for the last 3 days. Headache is dull and throbbing typical of her migraines. Pain is rated at 10/10. There has been associated nausea and vomiting and photophobia. Headache is worse with exposure to light or noise. She's tried taking tramadol with no relief. There's been slight blurring of her vision. She denies weakness or numbness. She has had slight dizziness  Past Medical History  Diagnosis Date  . Asthma   . Thyroid disease   . Hypertension   . High cholesterol     History reviewed. No pertinent past surgical history.  No family history on file.  History  Substance Use Topics  . Smoking status: Never Smoker   . Smokeless tobacco: Not on file  . Alcohol Use: No    OB History    Grav Para Term Preterm Abortions TAB SAB Ect Mult Living                  Review of Systems  Neurological: Positive for headaches.  All other systems reviewed and are negative.    Allergies  Aspirin and Penicillins  Home Medications   Current Outpatient Rx  Name Route Sig Dispense Refill  . ALBUTEROL SULFATE HFA 108 (90 BASE) MCG/ACT IN AERS Inhalation Inhale 2 puffs into the lungs every 6 (six) hours as needed. For wheezing    . IBUPROFEN 200 MG PO TABS Oral Take 1,200 mg by mouth 2 (two) times daily as needed. For pain    . NITROGLYCERIN 0.4 MG SL SUBL Sublingual Place 0.4 mg under the tongue every 5 (five) minutes as needed. For chest pain    . TRAMADOL HCL 50 MG PO TABS Oral Take 100 mg by mouth every 6 (six) hours as needed. For pain       BP 167/101  Pulse 82  Temp 98.3 F (36.8 C) (Oral)  Resp 16  SpO2 99%  Physical Exam  Nursing note and vitals reviewed.  46 year old female who is resting currently in no acute distress. Vital signs are significant for hypertension with blood pressure 167/101. Oxygen saturation is 99% which is normal. Head is normal cephalic and atraumatic. PERRLA, EOMI. Fundi show no hemorrhage, exudate, or papilledema. Neck is  Supple with moderate tenderness of the paracervical muscles, no midline tenderness. Back is nontender. Lungs are clear without rales, wheezes, rhonchi. Heart has regular rate and rhythm without murmur. Abdomen soft, flat, nontender without masses or hepatosplenomegaly. Extremities have no cyanosis or edema, full range of motion is present. Skin is warm and dry without rash. Neurologic: Mental status is normal, cranial nerves are intact, there no motor or sensory deficits.  ED Course  Procedures (including critical care time)   1. Headache       MDM  Migraine headache. She'll be given IV fluids and migraine cocktail and reassessed.  After IV fluids and migraine cocktail, she did not get any relief. She was given hydromorphone with no relief. She was given methocarbamol with excellent relief of her pain. She is sent home with prescriptions for Percocet and cyclobenzaprine.  Dione Booze, MD 07/16/12 [phone]

## 2012-09-21 ENCOUNTER — Observation Stay (HOSPITAL_COMMUNITY)
Admission: EM | Admit: 2012-09-21 | Discharge: 2012-09-23 | Disposition: A | Discharge status: Home / Self Care | Payer: Managed Care, Other (non HMO) | Attending: Internal Medicine | Admitting: Internal Medicine

## 2012-09-21 ENCOUNTER — Emergency Department (HOSPITAL_COMMUNITY): Payer: Self-pay

## 2012-09-21 ENCOUNTER — Encounter (HOSPITAL_COMMUNITY): Payer: Self-pay | Admitting: *Deleted

## 2012-09-21 DIAGNOSIS — R5383 Other fatigue: Secondary | ICD-10-CM | POA: Insufficient documentation

## 2012-09-21 DIAGNOSIS — R51 Headache: Secondary | ICD-10-CM

## 2012-09-21 DIAGNOSIS — G43909 Migraine, unspecified, not intractable, without status migrainosus: Secondary | ICD-10-CM | POA: Diagnosis present

## 2012-09-21 DIAGNOSIS — R519 Headache, unspecified: Secondary | ICD-10-CM | POA: Diagnosis present

## 2012-09-21 DIAGNOSIS — R209 Unspecified disturbances of skin sensation: Secondary | ICD-10-CM | POA: Insufficient documentation

## 2012-09-21 DIAGNOSIS — J45909 Unspecified asthma, uncomplicated: Secondary | ICD-10-CM | POA: Insufficient documentation

## 2012-09-21 DIAGNOSIS — E7800 Pure hypercholesterolemia, unspecified: Secondary | ICD-10-CM | POA: Insufficient documentation

## 2012-09-21 DIAGNOSIS — R29898 Other symptoms and signs involving the musculoskeletal system: Secondary | ICD-10-CM | POA: Insufficient documentation

## 2012-09-21 DIAGNOSIS — I10 Essential (primary) hypertension: Secondary | ICD-10-CM | POA: Insufficient documentation

## 2012-09-21 DIAGNOSIS — E079 Disorder of thyroid, unspecified: Secondary | ICD-10-CM | POA: Insufficient documentation

## 2012-09-21 DIAGNOSIS — R5381 Other malaise: Secondary | ICD-10-CM | POA: Insufficient documentation

## 2012-09-21 DIAGNOSIS — Z90710 Acquired absence of both cervix and uterus: Secondary | ICD-10-CM

## 2012-09-21 DIAGNOSIS — R531 Weakness: Secondary | ICD-10-CM | POA: Diagnosis present

## 2012-09-21 DIAGNOSIS — Z23 Encounter for immunization: Secondary | ICD-10-CM | POA: Insufficient documentation

## 2012-09-21 DIAGNOSIS — Z79899 Other long term (current) drug therapy: Secondary | ICD-10-CM | POA: Insufficient documentation

## 2012-09-21 DIAGNOSIS — G43409 Hemiplegic migraine, not intractable, without status migrainosus: Principal | ICD-10-CM | POA: Insufficient documentation

## 2012-09-21 HISTORY — DX: Migraine, unspecified, not intractable, without status migrainosus: G43.909

## 2012-09-21 HISTORY — DX: Morbid (severe) obesity due to excess calories: E66.01

## 2012-09-21 HISTORY — DX: Acquired absence of both cervix and uterus: Z90.710

## 2012-09-21 LAB — BASIC METABOLIC PANEL
BUN: 11 mg/dL (ref 6–23)
Calcium: 9.7 mg/dL (ref 8.4–10.5)
Creatinine, Ser: 0.69 mg/dL (ref 0.50–1.10)
GFR calc non Af Amer: >90 mL/min (ref >90)
Glucose, Bld: 98 mg/dL (ref 70–99)
Sodium: 141 mEq/L (ref 135–145)

## 2012-09-21 LAB — CBC WITH DIFFERENTIAL/PLATELET
Eosinophils Absolute: 0.2 10*3/uL (ref 0.0–0.7)
Eosinophils Relative: 2 % (ref 0–5)
Lymphs Abs: 2.5 10*3/uL (ref 0.7–4.0)
MCH: 31.1 pg (ref 26.0–34.0)
MCV: 91.2 fL (ref 78.0–100.0)
Monocytes Absolute: 0.5 10*3/uL (ref 0.1–1.0)
Platelets: 292 10*3/uL (ref 150–400)
RBC: 4.11 MIL/uL (ref 3.87–5.11)

## 2012-09-21 MED ORDER — DIPHENHYDRAMINE HCL 50 MG/ML IJ SOLN
25.0000 mg | Freq: Once | INTRAMUSCULAR | Status: AC
  Administered 2012-09-21: 25 mg via INTRAVENOUS
  Filled 2012-09-21 (×2): qty 1

## 2012-09-21 MED ORDER — PROMETHAZINE HCL 25 MG/ML IJ SOLN
25.0000 mg | Freq: Once | INTRAMUSCULAR | Status: AC
  Administered 2012-09-21: 25 mg via INTRAVENOUS
  Filled 2012-09-21: qty 1

## 2012-09-21 MED ORDER — DEXAMETHASONE SODIUM PHOSPHATE 10 MG/ML IJ SOLN
10.0000 mg | Freq: Once | INTRAMUSCULAR | Status: AC
  Administered 2012-09-21: 10 mg via INTRAVENOUS
  Filled 2012-09-21: qty 1

## 2012-09-21 MED ORDER — SODIUM CHLORIDE 0.9 % IV BOLUS (SEPSIS)
1000.0000 mL | Freq: Once | INTRAVENOUS | Status: AC
  Administered 2012-09-21: 1000 mL via INTRAVENOUS

## 2012-09-21 NOTE — ED Provider Notes (Addendum)
History     CSN: [id number]  Arrival date & time 09/21/12  2440   First MD Initiated Contact with Patient 09/21/12 2116      Chief Complaint  Patient presents with  . Migraine    (Consider location/radiation/quality/duration/timing/severity/associated sxs/prior treatment) HPI Comments: Patient presents with typical migraine.  Reports pain began gradually yesterday on the right side of her head, associated sensitivity to light and sound, associated nausea and blurry vision.  Pt states this is typical of her migraine.  When they get very bad she gets epistaxis, which occurred today and lasted a few minutes, resolving spontaneously.   Pt has taken tramadol, topamax, aleve, and ibuprofen without relief.  The last time she had a headache this intense was two weeks ago.  Currently 9/10 intensity.   Pt denies fevers, focal neurological deficits, head trauma, neck stiffness.  Pt is not on blood thinners.   The history is provided by the patient.    Past Medical History  Diagnosis Date  . Asthma   . Thyroid disease   . Hypertension   . High cholesterol   . Migraine     History reviewed. No pertinent past surgical history.  No family history on file.  History  Substance Use Topics  . Smoking status: Never Smoker   . Smokeless tobacco: Not on file  . Alcohol Use: No    OB History    Grav Para Term Preterm Abortions TAB SAB Ect Mult Living                  Review of Systems  Constitutional: Negative for fever and chills.  HENT: Negative for neck pain and neck stiffness.   Eyes: Positive for photophobia and visual disturbance.  Gastrointestinal: Positive for nausea. Negative for vomiting.  Neurological: Positive for light-headedness and headaches. Negative for weakness and numbness.  All other systems reviewed and are negative.    Allergies  Aspirin and Penicillins  Home Medications   Current Outpatient Rx  Name Route Sig Dispense Refill  . ALBUTEROL SULFATE HFA 108  (90 BASE) MCG/ACT IN AERS Inhalation Inhale 2 puffs into the lungs every 6 (six) hours as needed. For wheezing    . IBUPROFEN 200 MG PO TABS Oral Take 1,200 mg by mouth 2 (two) times daily as needed. For pain    . NITROGLYCERIN 0.4 MG SL SUBL Sublingual Place 0.4 mg under the tongue every 5 (five) minutes as needed. For chest pain    . TRAMADOL HCL 50 MG PO TABS Oral Take 100 mg by mouth every 6 (six) hours as needed. For pain      BP 168/100  Pulse 86  Temp 98.1 F (36.7 C) (Oral)  Resp 16  SpO2 98%  Physical Exam  Nursing note and vitals reviewed. Constitutional: She appears well-developed and well-nourished. No distress.  HENT:  Head: Normocephalic and atraumatic.  Mouth/Throat: Oropharynx is clear and moist. No oropharyngeal exudate.  Neck: Neck supple.  Pulmonary/Chest: Effort normal.  Neurological: She is alert. No cranial nerve deficit. She displays a negative Romberg sign. Coordination and gait normal. GCS eye subscore is 4. GCS verbal subscore is 5. GCS motor subscore is 6.       CN II-XII intact, EOMs intact, no pronator drift, grip strengths equal bilaterally; Strength slightly decreased on right compared to left and slight decrease of sensation on right compared to left; finger to nose, heel to shin, rapid alternating movements normal; gait is normal.     Skin:  She is not diaphoretic.    ED Course  Procedures (including critical care time)   Labs Reviewed  CBC WITH DIFFERENTIAL  BASIC METABOLIC PANEL   Ct Head Wo Contrast  09/21/2012  *RADIOLOGY REPORT*  Clinical Data: Migraine, dizziness  CT HEAD WITHOUT CONTRAST  Technique:  Contiguous axial images were obtained from the base of the skull through the vertex without contrast.  Comparison: Head CT - 04/16/2008; 03/22/2005  Findings:  Wallace Cullens white differentiation is maintained.  No CT evidence of acute large territory infarct.  No intraparenchymal or extra-axial mass or hemorrhage.  There are unchanged scattered  calcifications about the midline falx.  Unchanged size and configuration of the ventricles and basilar cisterns.  No midline shift.  Limited visualization of the paranasal sinuses and mastoid air cells are normal.  Regional soft tissues are normal.  No displaced calvarial fracture.  IMPRESSION: Negative noncontrast head CT.   Original Report Authenticated By: Waynard Reeds, M.D.     9:42 PM Pt seen and examined.  Pt reporting typical migraine.  However, pt with slight weakness and decreased sensation on right side.  Will CT head, treat migraine, reassess.    10:25 PM Discussed patient with Dr Bebe Shaggy, who will also see patient.   11:29 PM Dr Bebe Shaggy has also seen the patient, has placed labs.  Pt has neurological deficits on his exam as well. Plan is for admission as MRI unavailable tonight.  Will need to r/o stroke.    12:02 AM Pt reports no change in her headache.  Continued weakness and decreased sensation on right.  Plan is for admission.    12:56 AM I have spoken with [facility] for admission.  She requests EKG and repeat vitals prior to admission, also requests neuro consult.    1:15 AM I have spoken with neuro who will see patient.  I spoke again with [facility] who accepts admission.     Date: 09/22/2012  Rate: 82  Rhythm: normal sinus rhythm  QRS Axis: normal  Intervals: normal  ST/T Wave abnormalities: flattened t waves   Conduction Disutrbances:none  Narrative Interpretation:   Old EKG Reviewed: unchanged    1. Migraine   2. Right sided weakness   3. Hypertension     MDM  Pt with hx migraine presenting with typical migraine but now with neurological deficits which she states she has never had before.  Right upper and lower extremities are weaker that left and decreased sensation compared to left.  Ct is negative.  Pt is hypertensive, possibly due to pain but will give small dose of clonidine. Labs are normal.  Pt admitted to [facility] with neuro consult to r/o stroke, treat  migraine.          [place], [place] 09/22/12 0119  Trixie Dredge, PA 09/22/12 0119  Trixie Dredge, [place] 09/22/12 [phone]

## 2012-09-21 NOTE — ED Notes (Signed)
IV team at bedside 

## 2012-09-21 NOTE — ED Provider Notes (Signed)
Pt here for headache similar to prior but worse in intensity Also reports right sided paresis.  She has some right LE drift and ?right facial droop.   She reports weakness started yesterday Will need MR imaging  tPA in stroke considered but not given due to:  Onset over 3-4.5hours    Joya Gaskins, MD 09/21/12 2305

## 2012-09-21 NOTE — ED Notes (Signed)
IV team paged and en route

## 2012-09-21 NOTE — ED Notes (Signed)
Patient present to ED with c/o migraine.  Patient has history of migraines.  States that she had a nosebleed and work with vomiting.  Not nose bleed or vomiting at this time.  Pain started yesterday

## 2012-09-22 ENCOUNTER — Observation Stay (HOSPITAL_COMMUNITY): Payer: Self-pay

## 2012-09-22 ENCOUNTER — Encounter (HOSPITAL_COMMUNITY): Payer: Self-pay | Admitting: Neurology

## 2012-09-22 DIAGNOSIS — R531 Weakness: Secondary | ICD-10-CM | POA: Diagnosis present

## 2012-09-22 DIAGNOSIS — G43909 Migraine, unspecified, not intractable, without status migrainosus: Secondary | ICD-10-CM

## 2012-09-22 DIAGNOSIS — Z90710 Acquired absence of both cervix and uterus: Secondary | ICD-10-CM | POA: Insufficient documentation

## 2012-09-22 DIAGNOSIS — R51 Headache: Secondary | ICD-10-CM

## 2012-09-22 DIAGNOSIS — E7800 Pure hypercholesterolemia, unspecified: Secondary | ICD-10-CM | POA: Diagnosis present

## 2012-09-22 DIAGNOSIS — I10 Essential (primary) hypertension: Secondary | ICD-10-CM

## 2012-09-22 DIAGNOSIS — E079 Disorder of thyroid, unspecified: Secondary | ICD-10-CM | POA: Diagnosis present

## 2012-09-22 DIAGNOSIS — M6281 Muscle weakness (generalized): Secondary | ICD-10-CM

## 2012-09-22 LAB — MRSA PCR SCREENING: MRSA by PCR: NEGATIVE

## 2012-09-22 MED ORDER — SODIUM CHLORIDE 0.9 % IJ SOLN: 3.0000 mL | INTRAMUSCULAR | Status: DC | PRN

## 2012-09-22 MED ORDER — VALPROATE SODIUM 500 MG/5ML IV SOLN
500.0000 mg | Freq: Once | INTRAVENOUS | Status: AC
  Administered 2012-09-22: 500 mg via INTRAVENOUS
  Filled 2012-09-22 (×2): qty 5

## 2012-09-22 MED ORDER — PROCHLORPERAZINE EDISYLATE 5 MG/ML IJ SOLN
10.0000 mg | Freq: Once | INTRAMUSCULAR | Status: AC
  Administered 2012-09-22: 10 mg via INTRAVENOUS
  Filled 2012-09-22: qty 2

## 2012-09-22 MED ORDER — LORAZEPAM 2 MG/ML IJ SOLN
2.0000 mg | Freq: Once | INTRAMUSCULAR | Status: DC
Start: 2012-09-22 — End: 2012-09-22

## 2012-09-22 MED ORDER — SODIUM CHLORIDE 0.9 % IJ SOLN
3.0000 mL | Freq: Two times a day (BID) | INTRAMUSCULAR | Status: DC
  Administered 2012-09-22 – 2012-09-23 (×3): 3 mL via INTRAVENOUS

## 2012-09-22 MED ORDER — METOCLOPRAMIDE HCL 5 MG/ML IJ SOLN
10.0000 mg | Freq: Four times a day (QID) | INTRAMUSCULAR | Status: DC | PRN
  Filled 2012-09-22: qty 2

## 2012-09-22 MED ORDER — SODIUM CHLORIDE 0.9 % IV SOLN: 250.0000 mL | INTRAVENOUS | Status: DC | PRN

## 2012-09-22 MED ORDER — CLONIDINE HCL 0.1 MG PO TABS
0.1000 mg | ORAL_TABLET | Freq: Once | ORAL | Status: AC
  Administered 2012-09-22: 0.1 mg via ORAL
  Filled 2012-09-22: qty 1

## 2012-09-22 MED ORDER — LORAZEPAM 2 MG/ML IJ SOLN
1.0000 mg | Freq: Once | INTRAMUSCULAR | Status: AC
  Administered 2012-09-22: 1 mg via INTRAVENOUS
  Filled 2012-09-22: qty 1

## 2012-09-22 MED ORDER — PROMETHAZINE HCL 25 MG PO TABS: 12.5000 mg | ORAL_TABLET | Freq: Four times a day (QID) | ORAL | Status: DC | PRN

## 2012-09-22 MED ORDER — DIPHENHYDRAMINE HCL 50 MG/ML IJ SOLN
25.0000 mg | Freq: Once | INTRAMUSCULAR | Status: AC
  Administered 2012-09-22: 25 mg via INTRAVENOUS
  Filled 2012-09-22: qty 1

## 2012-09-22 MED ORDER — SODIUM CHLORIDE 0.9 % IV SOLN: INTRAVENOUS | Status: DC

## 2012-09-22 MED ORDER — KETOROLAC TROMETHAMINE 15 MG/ML IJ SOLN
15.0000 mg | Freq: Once | INTRAMUSCULAR | Status: AC
  Administered 2012-09-22: 15 mg via INTRAVENOUS
  Filled 2012-09-22: qty 1

## 2012-09-22 MED ORDER — METOCLOPRAMIDE HCL 5 MG/ML IJ SOLN
10.0000 mg | Freq: Once | INTRAMUSCULAR | Status: AC
  Administered 2012-09-22: 10 mg via INTRAVENOUS
  Filled 2012-09-22: qty 2

## 2012-09-22 MED ORDER — VALPROATE SODIUM 500 MG/5ML IV SOLN
500.0000 mg | Freq: Once | INTRAVENOUS | Status: AC
  Administered 2012-09-22: 500 mg via INTRAVENOUS
  Filled 2012-09-22: qty 5

## 2012-09-22 MED ORDER — METHYLPREDNISOLONE SODIUM SUCC 125 MG IJ SOLR
60.0000 mg | Freq: Three times a day (TID) | INTRAMUSCULAR | Status: DC
  Administered 2012-09-22: 60 mg via INTRAVENOUS
  Filled 2012-09-22 (×4): qty 0.96

## 2012-09-22 MED ORDER — DIPHENHYDRAMINE HCL 50 MG/ML IJ SOLN: 25.0000 mg | Freq: Every evening | INTRAMUSCULAR | Status: DC | PRN

## 2012-09-22 MED ORDER — INFLUENZA VIRUS VACC SPLIT PF IM SUSP
0.5000 mL | INTRAMUSCULAR | Status: AC
  Administered 2012-09-23: 0.5 mL via INTRAMUSCULAR
  Filled 2012-09-22: qty 0.5

## 2012-09-22 MED ORDER — SODIUM CHLORIDE 0.9 % IJ SOLN
3.0000 mL | Freq: Two times a day (BID) | INTRAMUSCULAR | Status: DC
  Administered 2012-09-23: 3 mL via INTRAVENOUS

## 2012-09-22 MED ORDER — SODIUM CHLORIDE 0.9 % IV SOLN
INTRAVENOUS | Status: AC
  Administered 2012-09-22: 1000 mL via INTRAVENOUS

## 2012-09-22 NOTE — Progress Notes (Addendum)
[facility] PROGRESS NOTE    SUBJECTIVE   HA has improved since early this AM. Now rated a 4/10. Still having a sensation of left leg heaviness.  She also is having a component of "brif stabbing sensations that move around her head" and are intermittent.     OBJECTIVE   Vital signs in last 24 hours: Temp:  [97.8 F (36.6 C)-98.5 F (36.9 C)] 97.8 F (36.6 C) (10/11 0600) Pulse Rate:  [78-92] 92  (10/11 0600) Resp:  [16-20] 20  (10/11 0600) BP: (142-168)/(76-104) 147/88 mmHg (10/11 0600) SpO2:  [98 %-100 %] 98 % (10/11 0600) Weight:  [124.739 kg (275 lb)] 124.739 kg (275 lb) (10/11 0900)  Intake/Output from previous day:   Intake/Output this shift:   Nutritional status: Cardiac  Past Medical History  Diagnosis Date  . Asthma   . Thyroid disease   . Hypertension   . High cholesterol   . Migraine   . Morbid obesity   . H/O: hysterectomy     Neurologic ROS negative with exception of above. Musculoskeletal ROS heavy left leg sensation  Neurologic Exam:   Mental Status: Alert, oriented, thought content appropriate.  Speech fluent without evidence of aphasia.  Able to follow 3 step commands without difficulty. Cranial Nerves: II: Discs flat bilaterally; Visual fields grossly normal, pupils equal, round, reactive to light and accommodation III,IV, VI: ptosis not present, extra-ocular motions intact bilaterally V,VII: smile symmetric, facial light touch sensation normal bilaterally VIII: hearing normal bilaterally IX,X: gag reflex present XI: bilateral shoulder shrug XII: midline tongue extension Motor: Right : Upper extremity   4/5    Left:     Upper extremity   5/5  Lower extremity   4/5     Lower extremity   5/5 No drift noted on upper or lower extremity--left hand had slight tremor when held out straight but this would dissipate when action was initiated.  Tone and bulk:normal tone throughout; no atrophy  noted Sensory: Pinprick and light touch intact throughout, bilaterally Deep Tendon Reflexes: 1+ and symmetric throughout Plantars: Right: downgoing   Left: downgoing Cerebellar: normal finger-to-nose (slower on the left),  normal heel-to-shin test CV: pulses palpable throughout    Lab Results: Lab Results  Component Value Date/Time   CHOL  Value: 137        ATP III CLASSIFICATION:  <200     mg/dL   Desirable  846-962  mg/dL   Borderline High  >=952    mg/dL   High 84/12/3242  0:10 AM   Lipid Panel No results found for this basename: CHOL,TRIG,HDL,CHOLHDL,VLDL,LDLCALC in the last 72 hours  Studies/Results: Ct Head Wo Contrast  09/21/2012  *RADIOLOGY REPORT*  Clinical Data: Migraine, dizziness  CT HEAD WITHOUT CONTRAST  Technique:  Contiguous axial images were obtained from the base of the skull through the vertex without contrast.  Comparison: Head CT - 04/16/2008; 03/22/2005  Findings:  Wallace Cullens white differentiation is maintained.  No CT evidence of acute large territory infarct.  No intraparenchymal or extra-axial mass or hemorrhage.  There are unchanged scattered calcifications about the midline falx.  Unchanged size and configuration of the ventricles and basilar cisterns.  No midline shift.  Limited visualization of the paranasal sinuses and mastoid air cells are normal.  Regional soft  tissues are normal.  No displaced calvarial fracture.  IMPRESSION: Negative noncontrast head CT.   Original Report Authenticated By: Waynard Reeds, M.D.     Medications:     Scheduled:    . sodium chloride   Intravenous STAT  . cloNIDine  0.1 mg Oral Once  . dexamethasone  10 mg Intravenous Once  . diphenhydrAMINE  25 mg Intravenous Once  . diphenhydrAMINE  25 mg Intravenous Once  . influenza  inactive virus vaccine  0.5 mL Intramuscular Tomorrow-1000  . ketorolac  15 mg Intravenous Once  . LORazepam  2 mg Intravenous Once  . methylPREDNISolone (SOLU-MEDROL) injection  60 mg Intravenous Q8H  .  metoCLOPramide (REGLAN) injection  10 mg Intravenous Once  . prochlorperazine  10 mg Intravenous Once  . promethazine  25 mg Intravenous Once  . sodium chloride  1,000 mL Intravenous Once  . sodium chloride  3 mL Intravenous Q12H  . sodium chloride  3 mL Intravenous Q12H  . valproate sodium  500 mg Intravenous Once    Assessment/Plan:    Patient Active Hospital Problem List: Weakness of right side of body (09/22/2012)   Assessment: right leg still feels heavy    HEADACHE, CHRONIC (11/26/2008)   Assessment: Throbbing bilateral HA with component of stabbing that moves to different spots of the head.  At times on right temporal and other times left temporal region.   NOTE: She has been seen by Dr Haskell Riling as out patient HA clinic. She uses Imitrex for breakthrough HA and recently stopped her Topamax three months ago due to no benefit. She has never been on CCB or BB for prohylactic HA management.   Plan: 1) MRI/MRA head 2) IV hydration 3) Will give cocktail of compazine, benadryl and Depakote for HA.  4) Continue to hold off on DHE until MRI.  5) Patient stopped her Topamax 3 months ago due to no benefit for her HA.  At this time would not restart.  6) IV solumedrol has been discontinued after discussing with Dr. Lavera Guise.   Felicie Morn PA-C [facility] [phone]  09/22/2012, 9:51 AM

## 2012-09-22 NOTE — ED Provider Notes (Signed)
Medical screening examination/treatment/procedure(s) were conducted as a shared visit with non-physician practitioner(s) and myself.  I personally evaluated the patient during the encounter    Joya Gaskins, MD 09/22/12 [phone]

## 2012-09-22 NOTE — Clinical Social Work Note (Signed)
Clinical Social Work  CSW received consult as pt is unable to afford her medications. CSW referred pt RNCM to address pt's concerns. CSW is signing off at this time, no further needs indentified. Please reconsult if a need arises prior to discharge.   Dede Query, MSW, Theresia Majors [phone]

## 2012-09-22 NOTE — H&P (Signed)
PCP:   Jeri Cos, MD   Chief Complaint:  headache  HPI: 46 yo AAF h/o migraines comes in with migraine for several days with associated right sided numbness and weakness.  Has never had the neurological symptoms with this before, normally has photophobia, n/v.  This headache is worse than her normal migraines.  Takes topomax and oxycodone at home for her migraines.  Ran out of her oxycodone last week.  No fevers.  Was dx with cva in her early 78's according to pt report.  cth neg.  Asked to admit for concern of stoke. No cp  No slurred speech.  Neurology evaluation pending by neuro team.  Review of Systems:  O/w neg  Past Medical History: Past Medical History  Diagnosis Date  . Asthma   . Thyroid disease   . Hypertension   . High cholesterol   . Migraine    History reviewed. No pertinent past surgical history.  Medications: Prior to Admission medications   Medication Sig Start Date End Date Taking? Authorizing Provider  albuterol (PROVENTIL HFA;VENTOLIN HFA) 108 (90 BASE) MCG/ACT inhaler Inhale 2 puffs into the lungs every 6 (six) hours as needed. For wheezing   Yes Historical Provider, MD  ibuprofen (ADVIL,MOTRIN) 200 MG tablet Take 1,200 mg by mouth 2 (two) times daily as needed. For pain   Yes Historical Provider, MD  nitroGLYCERIN (NITROSTAT) 0.4 MG SL tablet Place 0.4 mg under the tongue every 5 (five) minutes as needed. For chest pain   Yes Historical Provider, MD  traMADol (ULTRAM) 50 MG tablet Take 100 mg by mouth every 6 (six) hours as needed. For pain   Yes Historical Provider, MD    Allergies:   Allergies  Allergen Reactions  . Aspirin Nausea And Vomiting  . Penicillins Rash    Social History:  reports that she has never smoked. She does not have any smokeless tobacco history on file. She reports that she does not drink alcohol or use illicit drugs.  Family History: Neg  Physical Exam: Filed Vitals:   09/21/12 1840 09/21/12 2324  BP: 168/100     Pulse: 86   Temp: 98.1 F (36.7 C) 98 F (36.7 C)  TempSrc: Oral   Resp: 16   SpO2: 98%    General appearance: alert, cooperative and no distress Lungs: clear to auscultation bilaterally Heart: regular rate and rhythm, S1, S2 normal, no murmur, click, rub or gallop Abdomen: soft, non-tender; bowel sounds normal; no masses,  no organomegaly Extremities: extremities normal, atraumatic, no cyanosis or edema Pulses: 2+ and symmetric Skin: Skin color, texture, turgor normal. No rashes or lesions Neurologic: Cranial nerves: normal Sensory: normal Motor: 4/5 stregth right upper and lower ext.  5/5 left upper and lower reflexes intact      Labs on Admission:   [facility] 09/21/12 2310  NA 141  K 3.7  CL 102  CO2 29  GLUCOSE 98  BUN 11  CREATININE 0.69  CALCIUM 9.7  MG --  PHOS --    Basename 09/21/12 2310  WBC 7.5  NEUTROABS 4.4  HGB 12.8  HCT 37.5  MCV 91.2  PLT 292    Radiological Exams on Admission: Ct Head Wo Contrast  09/21/2012  *RADIOLOGY REPORT*  Clinical Data: Migraine, dizziness  CT HEAD WITHOUT CONTRAST  Technique:  Contiguous axial images were obtained from the base of the skull through the vertex without contrast.  Comparison: Head CT - 04/16/2008; 03/22/2005  Findings:  Wallace Cullens white differentiation is maintained.  No  CT evidence of acute large territory infarct.  No intraparenchymal or extra-axial mass or hemorrhage.  There are unchanged scattered calcifications about the midline falx.  Unchanged size and configuration of the ventricles and basilar cisterns.  No midline shift.  Limited visualization of the paranasal sinuses and mastoid air cells are normal.  Regional soft tissues are normal.  No displaced calvarial fracture.  IMPRESSION: Negative noncontrast head CT.   Original Report Authenticated By: Waynard Reeds, M.D.     Assessment/Plan Present on Admission:  46 yo female with migraine headache not typical of her usual migraines with associated rt  sided weakness who ran out of her oxycodone last week needs r/o cva .Migraine .Weakness of right side of body .HEADACHE, CHRONIC .High cholesterol .Hypertension .Thyroid disease  Will order mri.  Further w/u per neurology team who has been called by the EDP.  Tele.  Obs.  Needs better chronic management of her migraines, may be having rebound component of her headache due to running out of narcotics last week.  No fevers.  No meningeal signs.     Sheronica Corey A [phone] 09/22/2012, 12:19 AM

## 2012-09-22 NOTE — Progress Notes (Signed)
Patient seen and examined by myself multiple times throughout the day.. Admitted earlier with migraine and hemiplegia. Hemiplegia has improved. Continues to have migraine-type symptoms MRI has not been done the entire day Continue with careful neurological monitoring in the hospital. Repeat Benadryl, Reglan and Depacon for migraine Wendy Hoffman

## 2012-09-22 NOTE — Progress Notes (Signed)
   CARE MANAGEMENT NOTE 09/22/2012  Patient:  DORITA, RINGOR   Account Number:  [id number]  Date Initiated:  09/22/2012  Documentation initiated by:  [facility]  Subjective/Objective Assessment:   migraines     Action/Plan:   Anticipated DC Date:  09/22/2012   Anticipated DC Plan:  HOME/SELF CARE      DC Planning Services  CM consult  Medication Assistance      Choice offered to / List presented to:             Status of service:  Completed, signed off Medicare Important Message given?   (If response is "NO", the following Medicare IM given date fields will be blank) Date Medicare IM given:   Date Additional Medicare IM given:    Discharge Disposition:  HOME/SELF CARE  Per UR Regulation:    If discussed at Long Length of Stay Meetings, dates discussed:    Comments:  09/22/2012 1130 NCM spoke to pt and states she does have medication drug coverage. Her meds are almost $400 at the pharmacy when she goes to pick up. Explained pt can discuss samples from the office or visit the [email or web address] to look up her meds to see if any coupons or pt assistance programs are available. Isidoro Donning RN CCM Case Mgmt phone [phone]

## 2012-09-22 NOTE — Consult Note (Addendum)
Reason for Consult: Headache Referring Physician: ER  Wendy Hoffman is an 46 y.o. female.  HPI:   Wendy Hoffman is a 46 year old right-handed black female with a history of morbid obesity and a history of migraine headaches. The patient indicates that she usually has headaches once or twice a week, and the headaches are usually right-sided in nature. The patient indicates that she has never had numbness or weakness with her headaches previously. The patient will usually have a pressure sensation or a sharp pain with the headache, associated with some nausea without vomiting. The patient will have photophobia and phonophobia when the headache is severe. The patient may have neck discomfort. The patient had onset of her headache on 09/20/2012. The headache progressed and got worse on 09/21/2012 beginning around 10:30 AM. By 2 PM, the patient began having some tingling sensations in the right hand that has persisted. The patient denies any weakness of the right side. The patient has not had any visual loss, but she does have some right-sided neck pain. The patient denies any speech alteration. The patient does report some dizziness. The patient has not had any problems with swallowing or choking, and she denies any problems with balance. The patient comes to the hospital for an evaluation of her headache. A CT scan of brain was done and was unremarkable. NIH stroke scale score was 2, and the patient is not a TPA candidate secondary to duration of symptoms.   Past Medical History  Diagnosis Date  . Asthma   . Thyroid disease   . Hypertension   . High cholesterol   . Migraine   . Morbid obesity     Past Surgical History  Procedure Date  . Cesarean section     3 occasions  . Thyroidectomy, partial     Goiter  . Cholecystectomy     Family History  Problem Relation Age of Onset  . Migraines Sister     Social History:  reports that she has been smoking Cigarettes.  She does not have any  smokeless tobacco history on file. She reports that she does not drink alcohol or use illicit drugs.  Allergies:  Allergies  Allergen Reactions  . Aspirin Nausea And Vomiting  . Penicillins Rash    Medications:   Medications prior admission included:  Topamax 50 mg at night Ultram 50 mg one tablet every 6 hours if needed Oxycodone 5mg  every 6 hours if needed Albuterol inhaler 2 puffs every 6 hours if needed  Primary physician is Dr. Leretha Dykes.    Scheduled:   . cloNIDine  0.1 mg Oral Once  . dexamethasone  10 mg Intravenous Once  . diphenhydrAMINE  25 mg Intravenous Once  . metoCLOPramide (REGLAN) injection  10 mg Intravenous Once  . promethazine  25 mg Intravenous Once  . sodium chloride  1,000 mL Intravenous Once   Continuous:  PRN:  Results for orders placed during the hospital encounter of 09/21/12 (from the past 48 hour(s))  CBC WITH DIFFERENTIAL     Status: Normal   Collection Time   09/21/12 11:10 PM      Component Value Range Comment   WBC 7.5  4.0 - 10.5 K/uL    RBC 4.11  3.87 - 5.11 MIL/uL    Hemoglobin 12.8  12.0 - 15.0 g/dL    HCT 16.1  09.6 - 04.5 %    MCV 91.2  78.0 - 100.0 fL    MCH 31.1  26.0 - 34.0 pg  MCHC 34.1  30.0 - 36.0 g/dL    RDW 45.4  09.8 - 11.9 %    Platelets 292  150 - 400 K/uL    Neutrophils Relative 59  43 - 77 %    Neutro Abs 4.4  1.7 - 7.7 K/uL    Lymphocytes Relative 33  12 - 46 %    Lymphs Abs 2.5  0.7 - 4.0 K/uL    Monocytes Relative 6  3 - 12 %    Monocytes Absolute 0.5  0.1 - 1.0 K/uL    Eosinophils Relative 2  0 - 5 %    Eosinophils Absolute 0.2  0.0 - 0.7 K/uL    Basophils Relative 0  0 - 1 %    Basophils Absolute 0.0  0.0 - 0.1 K/uL   BASIC METABOLIC PANEL     Status: Normal   Collection Time   09/21/12 11:10 PM      Component Value Range Comment   Sodium 141  135 - 145 mEq/L    Potassium 3.7  3.5 - 5.1 mEq/L SLIGHT HEMOLYSIS   Chloride 102  96 - 112 mEq/L    CO2 29  19 - 32 mEq/L    Glucose, Bld 98  70 - 99  mg/dL    BUN 11  6 - 23 mg/dL    Creatinine, Ser 1.47  0.50 - 1.10 mg/dL    Calcium 9.7  8.4 - 82.9 mg/dL    GFR calc non Af Amer >90  >90 mL/min    GFR calc Af Amer >90  >90 mL/min     Ct Head Wo Contrast  09/21/2012  *RADIOLOGY REPORT*  Clinical Data: Migraine, dizziness  CT HEAD WITHOUT CONTRAST  Technique:  Contiguous axial images were obtained from the base of the skull through the vertex without contrast.  Comparison: Head CT - 04/16/2008; 03/22/2005  Findings:  Wallace Cullens white differentiation is maintained.  No CT evidence of acute large territory infarct.  No intraparenchymal or extra-axial mass or hemorrhage.  There are unchanged scattered calcifications about the midline falx.  Unchanged size and configuration of the ventricles and basilar cisterns.  No midline shift.  Limited visualization of the paranasal sinuses and mastoid air cells are normal.  Regional soft tissues are normal.  No displaced calvarial fracture.  IMPRESSION: Negative noncontrast head CT.   Original Report Authenticated By: Waynard Reeds, M.D.     @ROS @  The patient reports no recent fevers or chills. The patient has right-sided neck stiffness The patient reports dizziness, and some nausea The patient does have shortness of breath at times. The patient denies problems controlling the bowels or the bladder The patient denies problems with balance. The patient denies syncope.   Blood pressure 143/76, pulse 85, temperature 98.1 F (36.7 C), temperature source Oral, resp. rate 19, SpO2 98.00%.  Physical Examination:  General:  The patient is alert and cooperative at the time of the examination. The patient is oriented x3. The patient is morbidly obese.  Respiratory:  Lungs fields are clear to auscultation bilaterally.  Cardiovascular:  Examination reveals a regular rate and rhythm, no obvious murmurs or rubs are noted.  Abdominal Exam:  Abdomen is soft and nontender, positive bowel sounds are noted. No  organomegaly is noted.  Extremities:  Extremities are without significant edema.    Neurologic Examination  Cranial Nerves:  Facial symmetry is present. Pupils are equal, round, and reactive to light. Visual fields are full. Speech is normal, no aphasia  or dysarthria is noted. Pin prick sensation on the face is symmetric and normal.  Motor Examination: Motor testing of all 4 extremities reveals normal strength of the arms and the legs. The patient has mild drift with the right lower extremity.  Sensory Examination: Sensory examination of the arms and legs shows normal pinprick, soft touch, and vibration sensation throughout, with the exception that there is some decrease in pinprick sensation of the right leg.  Cerebellar Examination: The patient has good finger-nose-finger and heel-to-shin bilaterally. Gait was not tested.  Deep Tendon Reflexes: Deep tendon reflexes are symmetric and normal throughout. Toes are downgoing bilaterally.  Interval: Baseline (10/11 0100) Level of Consciousness (1a. ): Alert, keenly responsive (10/11 0100) LOC Questions (1b. ): Answers both questions correctly (10/11 0100) LOC Commands (1c. ): Performs both tasks correctly (10/11 0100) Best Gaze (2. ): Normal (10/11 0100) Visual (3. ): No visual loss (10/11 0100) Facial Palsy (4. ): Normal symmetrical movements (10/11 0100) Motor Arm, Left (5a. ): No drift (10/11 0100) Motor Arm, Right (5b. ): No drift (10/11 0100) Motor Leg, Left (6a. ): No drift (10/11 0100) Motor Leg, Right (6b. ): Drift (10/11 0100) Limb Ataxia (7. ): Absent (10/11 0100) Sensory (8. ): Mild-to-moderate sensory loss, patient feels pinprick is less sharp or is dull on the affected side, or there is a loss of superficial pain with pinprick, but patient is aware of being touched (10/11 0100) Best Language (9. ): No aphasia (10/11 0100) Dysarthria (10. ): Normal (10/11 0100) Inattention/Extinction: No Abnormality (10/11 0100) Total:  2  (10/11 0100)     Assessment/Plan:   One. Migraine headache  2. Right hand numbness  3. Morbid obesity  The patient has minimal sensory complaints and some mild right lower extremity drift by examination. The patient will be admitted for further evaluation. The patient will need to be evaluated for possible complicated migraine. The patient was not on aspirin prior to admission.  -MRI the brain -MRA of the head -Plavix  therapy when able to take oral, patient is n.p.o. at this time  -Consider DHE 45 protocol if MRI the brain is unremarkable. -IV fluid hydration -Continue Topamax, increase the dose -Medication for pain control  Nancyjo Givhan KEITH 09/22/2012, 1:46 AM

## 2012-09-23 MED ORDER — KETOROLAC TROMETHAMINE 30 MG/ML IJ SOLN
INTRAMUSCULAR | Status: AC
  Administered 2012-09-23: 15 mg
  Filled 2012-09-23: qty 1

## 2012-09-23 MED ORDER — VERAPAMIL HCL ER 120 MG PO TBCR
120.0000 mg | EXTENDED_RELEASE_TABLET | Freq: Every day | ORAL | Status: DC
  Administered 2012-09-23: 120 mg via ORAL
  Filled 2012-09-23: qty 1

## 2012-09-23 MED ORDER — METOCLOPRAMIDE HCL 5 MG/ML IJ SOLN
5.0000 mg | INTRAMUSCULAR | Status: AC
  Administered 2012-09-23: 5 mg via INTRAVENOUS
  Filled 2012-09-23: qty 1

## 2012-09-23 MED ORDER — ERGOTAMINE-CAFFEINE 1-100 MG PO TABS: ORAL_TABLET | ORAL | Status: DC

## 2012-09-23 MED ORDER — VALPROATE SODIUM 500 MG/5ML IV SOLN
500.0000 mg | Freq: Once | INTRAVENOUS | Status: AC
  Administered 2012-09-23: 500 mg via INTRAVENOUS
  Filled 2012-09-23: qty 5

## 2012-09-23 MED ORDER — TRAMADOL HCL 50 MG PO TABS: 50.0000 mg | ORAL_TABLET | Freq: Four times a day (QID) | ORAL | Status: DC | PRN

## 2012-09-23 MED ORDER — VERAPAMIL HCL ER 120 MG PO TBCR: 120.0000 mg | EXTENDED_RELEASE_TABLET | Freq: Every day | ORAL | Status: DC

## 2012-09-23 MED ORDER — DIHYDROERGOTAMINE MESYLATE 1 MG/ML IJ SOLN
0.5000 mg | Freq: Once | INTRAMUSCULAR | Status: AC
  Administered 2012-09-23: 0.5 mg via INTRAVENOUS
  Filled 2012-09-23 (×2): qty 0.5

## 2012-09-23 MED ORDER — KETOROLAC TROMETHAMINE 15 MG/ML IJ SOLN
15.0000 mg | Freq: Once | INTRAMUSCULAR | Status: AC
  Administered 2012-09-23: 15 mg via INTRAVENOUS
  Filled 2012-09-23: qty 1

## 2012-09-23 MED ORDER — KETOROLAC TROMETHAMINE 15 MG/ML IJ SOLN
15.0000 mg | Freq: Once | INTRAMUSCULAR | Status: AC
  Filled 2012-09-23: qty 1

## 2012-09-23 MED ORDER — DIPHENHYDRAMINE HCL 50 MG/ML IJ SOLN
12.5000 mg | INTRAMUSCULAR | Status: AC
  Administered 2012-09-23: 12.5 mg via INTRAVENOUS
  Filled 2012-09-23: qty 1

## 2012-09-23 NOTE — Discharge Summary (Signed)
Physician Discharge Summary  Wendy Hoffman:[id number] DOB: 06-25-1966 DOA: 09/21/2012  PCP: Jeri Cos, MD  Admit date: 09/21/2012 Discharge date: 09/23/2012  Recommendations for Outpatient Follow-up:  Continue to monitor for migraine symptoms and titrate medications to control attacks. Please monitor patient's blood pressure as she was started on ergotamine.  Discharge Diagnoses:   Complex migraine with transient right hemiplegia  Thyroid disease  Hypertension  High cholesterol   Discharge Condition: Good  Diet recommendation: Regular diet  Filed Weights   09/22/12 0900  Weight: 124.739 kg (275 lb)    History of present illness:  yo AAF h/o migraines comes in with migraine for several days with associated right sided numbness and weakness. Has never had the neurological symptoms with this before, normally has photophobia, n/v. This headache is worse than her normal migraines. Takes topomax and oxycodone at home for her migraines. Ran out of her oxycodone last week. No fevers. Was dx with cva in her early 69's according to pt report. cth neg. Asked to admit for concern of stoke. No cp No slurred speech. Neurology evaluation pending by neuro team.   Hospital Course:  The patient presented to hospital with severe migraine attack with right hemiplegia. She received abortive treatment in the emergency room with Benadryl, Ativan and Depacon. Her right hemiplegia resolved. She continued to have 6/10 severe headaches for which she received Reglan, Benadryl and repeat Depacon. MRI of the brain was obtained and did not indicate an acute stroke. It showed chronic changes suggestive of severe migraines. Patient required IV ergotamine to abort the migraine attack. The plan is to use verapamil sustained release 120 mg daily as preventative medication and Cafergot as abortive medication. Patient will need close monitoring of her symptom control as well as for blood pressure readings.    Procedures: MRI brain Consultations:  Neurology  Discharge Exam: Filed Vitals:   09/22/12 1448 09/22/12 2229 09/23/12 0547 09/23/12 1419  BP: 141/82 129/66 136/77 156/85  Pulse: 95 91 99 85  Temp: 98.1 F (36.7 C) 98 F (36.7 C) 98.2 F (36.8 C) 98.3 F (36.8 C)  TempSrc: Oral Oral Oral Oral  Resp: 18 18 18 20   Height:      Weight:      SpO2: 99% 100% 100% 100%    General: Alert and oriented x3 Cardiovascular: Regular rate and rhythm without murmurs rubs or gallops Respiratory: Clear to auscultation bilaterally  Discharge Instructions  Discharge Orders    Future Orders Please Complete By Expires   Diet general      Activity as tolerated - No restrictions          Medication List     As of 09/23/2012  5:33 PM    STOP taking these medications         ibuprofen 200 MG tablet   Commonly known as: ADVIL,MOTRIN      nitroGLYCERIN 0.4 MG SL tablet   Commonly known as: NITROSTAT      TAKE these medications         albuterol 108 (90 BASE) MCG/ACT inhaler   Commonly known as: PROVENTIL HFA;VENTOLIN HFA   Inhale 2 puffs into the lungs every 6 (six) hours as needed. For wheezing      ergotamine-caffeine 1-100 MG per tablet   Commonly known as: CAFERGOT   Two tablets at onset of attack; then 1 tablet every 30 minutes as needed; maximum: 6 tablets per attack; do not exceed 10 tablets/week      traMADol  50 MG tablet   Commonly known as: ULTRAM   Take 1 tablet (50 mg total) by mouth every 6 (six) hours as needed. For pain      verapamil 120 MG CR tablet   Commonly known as: CALAN-SR   Take 1 tablet (120 mg total) by mouth daily.          The results of significant diagnostics from this hospitalization (including imaging, microbiology, ancillary and laboratory) are listed below for reference.    Significant Diagnostic Studies: Ct Head Wo Contrast  09/21/2012  *RADIOLOGY REPORT*  Clinical Data: Migraine, dizziness  CT HEAD WITHOUT CONTRAST  Technique:   Contiguous axial images were obtained from the base of the skull through the vertex without contrast.  Comparison: Head CT - 04/16/2008; 03/22/2005  Findings:  Wallace Cullens white differentiation is maintained.  No CT evidence of acute large territory infarct.  No intraparenchymal or extra-axial mass or hemorrhage.  There are unchanged scattered calcifications about the midline falx.  Unchanged size and configuration of the ventricles and basilar cisterns.  No midline shift.  Limited visualization of the paranasal sinuses and mastoid air cells are normal.  Regional soft tissues are normal.  No displaced calvarial fracture.  IMPRESSION: Negative noncontrast head CT.   Original Report Authenticated By: Waynard Reeds, M.D.    Mr [facility] Wo Contrast  09/22/2012  *RADIOLOGY REPORT*  Clinical Data:  History migraine headaches with new worst headache and right-sided weakness.  MRI HEAD WITHOUT CONTRAST MRA HEAD WITHOUT CONTRAST  Technique:  Multiplanar, multiecho pulse sequences of the brain and surrounding structures were obtained without intravenous contrast. Angiographic images of the head were obtained using MRA technique without contrast.  Comparison:  CT head without contrast 09/21/2012.  MRI HEAD  Findings:  Mild susceptibility artifact is secondary to the patient's hair weave.  Accounting for the accounting for the artifact, no acute infarct is present.  Scattered subcortical T2 and FLAIR hyperintensities are slightly greater than expected for age.  No hemorrhage or mass lesion is present.  Flow is present in the major intracranial arteries.  The globes and orbits are intact.  A polyp or mucous retention cyst is present in the right maxillary sinus.  The paranasal sinuses and mastoid air cells are otherwise clear.  The globes and orbits are intact.  IMPRESSION:  1.  No acute intracranial abnormality. 2.  Scattered subcortical T2 hyperintensities are slightly greater than expected for age. The finding is nonspecific  but can be seen in the setting of chronic microvascular ischemia, a demyelinating process such as multiple sclerosis, vasculitis, complicated migraine headaches, or as the sequelae of a prior infectious or inflammatory process. 3.  Small polyp or mucous retention cyst of the right maxillary sinus.  MRA HEAD  Findings: Mild tortuosity is present in the cervical right internal carotid artery without significant stenosis.  The internal carotid arteries are otherwise within normal limits.  The study is mildly degraded by patient motion.  The A1 and M1 segments are normal. The MCA bifurcations are within normal limits.  AC and MCA branch vessels are normal.  There is significant signal loss within the right vertebral artery. The left vertebral artery demonstrates mild tapering distally. There is focal signal loss in the proximal basilar artery.  Both posterior cerebral arteries originate from basilar tip.  The PCA branch vessels are within normal limits.  IMPRESSION:  1.  Asymmetric signal loss within the right vertebral artery suggesting a significant stenosis or occlusion. The artery  appears more normal on the T2 axial sequences.  The signal loss may in part be artifactual with significant motion and artifact in the area. Given the lack of other significant findings, this could be confirmed with CTA of the head. 2.  The anterior circulation is within normal limits.   Original Report Authenticated By: Jamesetta Orleans. MATTERN, M.D.    Mr Brain Wo Contrast  09/22/2012  *RADIOLOGY REPORT*  Clinical Data:  History migraine headaches with new worst headache and right-sided weakness.  MRI HEAD WITHOUT CONTRAST MRA HEAD WITHOUT CONTRAST  Technique:  Multiplanar, multiecho pulse sequences of the brain and surrounding structures were obtained without intravenous contrast. Angiographic images of the head were obtained using MRA technique without contrast.  Comparison:  CT head without contrast 09/21/2012.  MRI HEAD  Findings:   Mild susceptibility artifact is secondary to the patient's hair weave.  Accounting for the accounting for the artifact, no acute infarct is present.  Scattered subcortical T2 and FLAIR hyperintensities are slightly greater than expected for age.  No hemorrhage or mass lesion is present.  Flow is present in the major intracranial arteries.  The globes and orbits are intact.  A polyp or mucous retention cyst is present in the right maxillary sinus.  The paranasal sinuses and mastoid air cells are otherwise clear.  The globes and orbits are intact.  IMPRESSION:  1.  No acute intracranial abnormality. 2.  Scattered subcortical T2 hyperintensities are slightly greater than expected for age. The finding is nonspecific but can be seen in the setting of chronic microvascular ischemia, a demyelinating process such as multiple sclerosis, vasculitis, complicated migraine headaches, or as the sequelae of a prior infectious or inflammatory process. 3.  Small polyp or mucous retention cyst of the right maxillary sinus.  MRA HEAD  Findings: Mild tortuosity is present in the cervical right internal carotid artery without significant stenosis.  The internal carotid arteries are otherwise within normal limits.  The study is mildly degraded by patient motion.  The A1 and M1 segments are normal. The MCA bifurcations are within normal limits.  AC and MCA branch vessels are normal.  There is significant signal loss within the right vertebral artery. The left vertebral artery demonstrates mild tapering distally. There is focal signal loss in the proximal basilar artery.  Both posterior cerebral arteries originate from basilar tip.  The PCA branch vessels are within normal limits.  IMPRESSION:  1.  Asymmetric signal loss within the right vertebral artery suggesting a significant stenosis or occlusion. The artery appears more normal on the T2 axial sequences.  The signal loss may in part be artifactual with significant motion and artifact  in the area. Given the lack of other significant findings, this could be confirmed with CTA of the head. 2.  The anterior circulation is within normal limits.   Original Report Authenticated By: Jamesetta Orleans. MATTERN, M.D.     Microbiology: Recent Results (from the past 240 hour(s))  MRSA PCR SCREENING     Status: Normal   Collection Time   09/22/12  3:07 PM      Component Value Range Status Comment   MRSA by PCR NEGATIVE  NEGATIVE Final      Labs: Basic Metabolic Panel:  Lab 09/21/12 4098  NA 141  K 3.7  CL 102  CO2 29  GLUCOSE 98  BUN 11  CREATININE 0.69  CALCIUM 9.7  MG --  PHOS --   Liver Function Tests: No results found for this basename:  AST:5,ALT:5,ALKPHOS:5,BILITOT:5,PROT:5,ALBUMIN:5 in the last 168 hours No results found for this basename: LIPASE:5,AMYLASE:5 in the last 168 hours No results found for this basename: AMMONIA:5 in the last 168 hours CBC:  Lab 09/21/12 2310  WBC 7.5  NEUTROABS 4.4  HGB 12.8  HCT 37.5  MCV 91.2  PLT 292   Cardiac Enzymes: No results found for this basename: CKTOTAL:5,CKMB:5,CKMBINDEX:5,TROPONINI:5 in the last 168 hours BNP: BNP (last 3 results) No results found for this basename: PROBNP:3 in the last 8760 hours CBG: No results found for this basename: GLUCAP:5 in the last 168 hours  Time coordinating discharge: 45 minutes  Signed:  Jad Johansson  [facility] 09/23/2012, 5:33 PM

## 2012-09-23 NOTE — Progress Notes (Signed)
[facility] PROGRESS NOTE  Wendy Hoffman ZOX:[id number] DOB: [birth year] DOA: 09/21/2012 PCP: Jeri Cos, MD  Assessment/Plan: 1. Severe migraine with neurological symptoms - today she is rating her HA 10/10 and she is staying with blinds closed and head under the covers. She has received already 3 doses of iv Depacon, 3 doses of iv Toradol without much relief. D/w with Dr. Thad Ranger and we will give iv DHE.  Code Status: full Family Communication: patient  Disposition Plan: home once HA controlled    Consultants:  Neurology  Procedures:  MRI brain  Antibiotics:  HPI/Subjective: C/o severe HA  Objective: Filed Vitals:   09/22/12 1448 09/22/12 2229 09/23/12 0547 09/23/12 1419  BP: 141/82 129/66 136/77 156/85  Pulse: 95 91 99 85  Temp: 98.1 F (36.7 C) 98 F (36.7 C) 98.2 F (36.8 C) 98.3 F (36.8 C)  TempSrc: Oral Oral Oral Oral  Resp: 18 18 18 20   Height:      Weight:      SpO2: 99% 100% 100% 100%   No intake or output data in the 24 hours ending 09/23/12 1449 Filed Weights   09/22/12 0900  Weight: 124.739 kg (275 lb)    Exam:   General:  Arousable, oriented x3  Cardiovascular: rrr  Respiratory: ctab  Abdomen: soft, nt  Data Reviewed: Basic Metabolic Panel:  Lab 09/21/12 8119  NA 141  K 3.7  CL 102  CO2 29  GLUCOSE 98  BUN 11  CREATININE 0.69  CALCIUM 9.7  MG --  PHOS --   Liver Function Tests: No results found for this basename: AST:5,ALT:5,ALKPHOS:5,BILITOT:5,PROT:5,ALBUMIN:5 in the last 168 hours No results found for this basename: LIPASE:5,AMYLASE:5 in the last 168 hours No results found for this basename: AMMONIA:5 in the last 168 hours CBC:  Lab 09/21/12 2310  WBC 7.5  NEUTROABS 4.4  HGB 12.8  HCT 37.5  MCV 91.2  PLT 292   Cardiac Enzymes: No results found for this basename: CKTOTAL:5,CKMB:5,CKMBINDEX:5,TROPONINI:5 in the last 168 hours BNP (last 3 results) No results found for this basename: PROBNP:3 in the  last 8760 hours CBG: No results found for this basename: GLUCAP:5 in the last 168 hours  Recent Results (from the past 240 hour(s))  MRSA PCR SCREENING     Status: Normal   Collection Time   09/22/12  3:07 PM      Component Value Range Status Comment   MRSA by PCR NEGATIVE  NEGATIVE Final      Studies: Ct Head Wo Contrast  09/21/2012  *RADIOLOGY REPORT*  Clinical Data: Migraine, dizziness  CT HEAD WITHOUT CONTRAST  Technique:  Contiguous axial images were obtained from the base of the skull through the vertex without contrast.  Comparison: Head CT - 04/16/2008; 03/22/2005  Findings:  Wallace Cullens white differentiation is maintained.  No CT evidence of acute large territory infarct.  No intraparenchymal or extra-axial mass or hemorrhage.  There are unchanged scattered calcifications about the midline falx.  Unchanged size and configuration of the ventricles and basilar cisterns.  No midline shift.  Limited visualization of the paranasal sinuses and mastoid air cells are normal.  Regional soft tissues are normal.  No displaced calvarial fracture.  IMPRESSION: Negative noncontrast head CT.   Original Report Authenticated By: Waynard Reeds, M.D.    Mr [facility] Wo Contrast  09/22/2012  *RADIOLOGY REPORT*  Clinical Data:  History migraine headaches with new worst headache and right-sided weakness.  MRI HEAD WITHOUT CONTRAST MRA HEAD WITHOUT CONTRAST  Technique:  Multiplanar, multiecho pulse sequences of the brain and surrounding structures were obtained without intravenous contrast. Angiographic images of the head were obtained using MRA technique without contrast.  Comparison:  CT head without contrast 09/21/2012.  MRI HEAD  Findings:  Mild susceptibility artifact is secondary to the patient's hair weave.  Accounting for the accounting for the artifact, no acute infarct is present.  Scattered subcortical T2 and FLAIR hyperintensities are slightly greater than expected for age.  No hemorrhage or mass lesion  is present.  Flow is present in the major intracranial arteries.  The globes and orbits are intact.  A polyp or mucous retention cyst is present in the right maxillary sinus.  The paranasal sinuses and mastoid air cells are otherwise clear.  The globes and orbits are intact.  IMPRESSION:  1.  No acute intracranial abnormality. 2.  Scattered subcortical T2 hyperintensities are slightly greater than expected for age. The finding is nonspecific but can be seen in the setting of chronic microvascular ischemia, a demyelinating process such as multiple sclerosis, vasculitis, complicated migraine headaches, or as the sequelae of a prior infectious or inflammatory process. 3.  Small polyp or mucous retention cyst of the right maxillary sinus.  MRA HEAD  Findings: Mild tortuosity is present in the cervical right internal carotid artery without significant stenosis.  The internal carotid arteries are otherwise within normal limits.  The study is mildly degraded by patient motion.  The A1 and M1 segments are normal. The MCA bifurcations are within normal limits.  AC and MCA branch vessels are normal.  There is significant signal loss within the right vertebral artery. The left vertebral artery demonstrates mild tapering distally. There is focal signal loss in the proximal basilar artery.  Both posterior cerebral arteries originate from basilar tip.  The PCA branch vessels are within normal limits.  IMPRESSION:  1.  Asymmetric signal loss within the right vertebral artery suggesting a significant stenosis or occlusion. The artery appears more normal on the T2 axial sequences.  The signal loss may in part be artifactual with significant motion and artifact in the area. Given the lack of other significant findings, this could be confirmed with CTA of the head. 2.  The anterior circulation is within normal limits.   Original Report Authenticated By: Jamesetta Orleans. MATTERN, M.D.    Mr Brain Wo Contrast  09/22/2012  *RADIOLOGY  REPORT*  Clinical Data:  History migraine headaches with new worst headache and right-sided weakness.  MRI HEAD WITHOUT CONTRAST MRA HEAD WITHOUT CONTRAST  Technique:  Multiplanar, multiecho pulse sequences of the brain and surrounding structures were obtained without intravenous contrast. Angiographic images of the head were obtained using MRA technique without contrast.  Comparison:  CT head without contrast 09/21/2012.  MRI HEAD  Findings:  Mild susceptibility artifact is secondary to the patient's hair weave.  Accounting for the accounting for the artifact, no acute infarct is present.  Scattered subcortical T2 and FLAIR hyperintensities are slightly greater than expected for age.  No hemorrhage or mass lesion is present.  Flow is present in the major intracranial arteries.  The globes and orbits are intact.  A polyp or mucous retention cyst is present in the right maxillary sinus.  The paranasal sinuses and mastoid air cells are otherwise clear.  The globes and orbits are intact.  IMPRESSION:  1.  No acute intracranial abnormality. 2.  Scattered subcortical T2 hyperintensities are slightly greater than expected for age. The finding is nonspecific but can be  seen in the setting of chronic microvascular ischemia, a demyelinating process such as multiple sclerosis, vasculitis, complicated migraine headaches, or as the sequelae of a prior infectious or inflammatory process. 3.  Small polyp or mucous retention cyst of the right maxillary sinus.  MRA HEAD  Findings: Mild tortuosity is present in the cervical right internal carotid artery without significant stenosis.  The internal carotid arteries are otherwise within normal limits.  The study is mildly degraded by patient motion.  The A1 and M1 segments are normal. The MCA bifurcations are within normal limits.  AC and MCA branch vessels are normal.  There is significant signal loss within the right vertebral artery. The left vertebral artery demonstrates mild  tapering distally. There is focal signal loss in the proximal basilar artery.  Both posterior cerebral arteries originate from basilar tip.  The PCA branch vessels are within normal limits.  IMPRESSION:  1.  Asymmetric signal loss within the right vertebral artery suggesting a significant stenosis or occlusion. The artery appears more normal on the T2 axial sequences.  The signal loss may in part be artifactual with significant motion and artifact in the area. Given the lack of other significant findings, this could be confirmed with CTA of the head. 2.  The anterior circulation is within normal limits.   Original Report Authenticated By: Jamesetta Orleans. MATTERN, M.D.     Scheduled Meds:   . dihydroergotamine  0.5 mg Intravenous Once  . diphenhydrAMINE  12.5 mg Intravenous NOW  . influenza  inactive virus vaccine  0.5 mL Intramuscular Tomorrow-1000  . ketorolac  15 mg Intravenous Once  . ketorolac  15 mg Intravenous Once  . ketorolac      . LORazepam  1 mg Intravenous Once  . metoCLOPramide (REGLAN) injection  5 mg Intravenous NOW  . sodium chloride  3 mL Intravenous Q12H  . sodium chloride  3 mL Intravenous Q12H  . valproate sodium  500 mg Intravenous Once  . valproate sodium  500 mg Intravenous Once  . verapamil  120 mg Oral Daily   Continuous Infusions:   Principal Problem:  *Weakness of right side of body Active Problems:  HEADACHE, CHRONIC  Migraine  Thyroid disease  Hypertension  High cholesterol        Camilo Mander  [facility] Pager [phone]. If 8PM-8AM, please contact night-coverage at [email or web address], password [facility] 09/23/2012, 2:49 PM  LOS: 2 days

## 2012-09-23 NOTE — Progress Notes (Signed)
Subjective: Patient reports this morning that she is better.  Headache rated at a 6/10.  Reports that her right side is stronger.  Has no further nausea.  MRI of the brain shows some scattered white matter lesions consistent with migraine.  MRA shows no evidence of aneurysm.    Objective: Current vital signs: BP 136/77  Pulse 99  Temp 98.2 F (36.8 C) (Oral)  Resp 18  Ht 5\' 2"  (1.575 m)  Wt 124.739 kg (275 lb)  BMI 50.30 kg/m2  SpO2 100% Vital signs in last 24 hours: Temp:  [98 F (36.7 C)-98.2 F (36.8 C)] 98.2 F (36.8 C) (10/12 0547) Pulse Rate:  [91-99] 99  (10/12 0547) Resp:  [18] 18  (10/12 0547) BP: (129-141)/(66-82) 136/77 mmHg (10/12 0547) SpO2:  [99 %-100 %] 100 % (10/12 0547) Weight:  [124.739 kg (275 lb)] 124.739 kg (275 lb) (10/11 0900)  Intake/Output from previous day:   Intake/Output this shift:   Nutritional status: Cardiac  Neurologic Exam: Mental Status: Alert, oriented, thought content appropriate.  Speech fluent without evidence of aphasia.  Able to follow 3 step commands without difficulty. Cranial Nerves: II: Discs flat bilaterally; Visual fields grossly normal, pupils equal, round, reactive to light and accommodation III,IV, VI: ptosis not present, extra-ocular motions intact bilaterally V,VII: smile symmetric, facial light touch sensation normal bilaterally VIII: hearing normal bilaterally IX,X: gag reflex present XI: bilateral shoulder shrug XII: midline tongue extension Motor: Right : Upper extremity   5/5 w/5-/5 hand grip; no drift   Left:     Upper extremity   5/5  Lower extremity   5/5        Lower extremity   5/5 Tone and bulk:normal tone throughout; no atrophy noted Sensory: Pinprick and light touch intact throughout, bilaterally Deep Tendon Reflexes: 1+ and symmetric throughout Plantars: Right: downgoing   Left: downgoing CV: pulses palpable throughout   Lab Results: Basic Metabolic Panel:  Lab 09/21/12 1610  NA 141  K 3.7  CL  102  CO2 29  GLUCOSE 98  BUN 11  CREATININE 0.69  CALCIUM 9.7  MG --  PHOS --    CBC:  Lab 09/21/12 2310  WBC 7.5  NEUTROABS 4.4  HGB 12.8  HCT 37.5  MCV 91.2  PLT 292    Cardiac Enzymes: No results found for this basename: CKTOTAL:5,CKMB:5,CKMBINDEX:5,TROPONINI:5 in the last 168 hours  Lipid Panel: No results found for this basename: CHOL:5,TRIG:5,HDL:5,CHOLHDL:5,VLDL:5,LDLCALC:5 in the last 168 hours  CBG: No results found for this basename: GLUCAP:5 in the last 168 hours  Microbiology: Results for orders placed during the hospital encounter of 09/21/12  MRSA PCR SCREENING     Status: Normal   Collection Time   09/22/12  3:07 PM      Component Value Range Status Comment   MRSA by PCR NEGATIVE  NEGATIVE Final     Coagulation Studies: No results found for this basename: LABPROT:5,INR:5 in the last 72 hours  Imaging: Ct Head Wo Contrast  09/21/2012  *RADIOLOGY REPORT*  Clinical Data: Migraine, dizziness  CT HEAD WITHOUT CONTRAST  Technique:  Contiguous axial images were obtained from the base of the skull through the vertex without contrast.  Comparison: Head CT - 04/16/2008; 03/22/2005  Findings:  Wallace Cullens white differentiation is maintained.  No CT evidence of acute large territory infarct.  No intraparenchymal or extra-axial mass or hemorrhage.  There are unchanged scattered calcifications about the midline falx.  Unchanged size and configuration of the ventricles and basilar cisterns.  No midline shift.  Limited visualization of the paranasal sinuses and mastoid air cells are normal.  Regional soft tissues are normal.  No displaced calvarial fracture.  IMPRESSION: Negative noncontrast head CT.   Original Report Authenticated By: Waynard Reeds, M.D.    Mr [facility] Wo Contrast  09/22/2012  *RADIOLOGY REPORT*  Clinical Data:  History migraine headaches with new worst headache and right-sided weakness.  MRI HEAD WITHOUT CONTRAST MRA HEAD WITHOUT CONTRAST  Technique:   Multiplanar, multiecho pulse sequences of the brain and surrounding structures were obtained without intravenous contrast. Angiographic images of the head were obtained using MRA technique without contrast.  Comparison:  CT head without contrast 09/21/2012.  MRI HEAD  Findings:  Mild susceptibility artifact is secondary to the patient's hair weave.  Accounting for the accounting for the artifact, no acute infarct is present.  Scattered subcortical T2 and FLAIR hyperintensities are slightly greater than expected for age.  No hemorrhage or mass lesion is present.  Flow is present in the major intracranial arteries.  The globes and orbits are intact.  A polyp or mucous retention cyst is present in the right maxillary sinus.  The paranasal sinuses and mastoid air cells are otherwise clear.  The globes and orbits are intact.  IMPRESSION:  1.  No acute intracranial abnormality. 2.  Scattered subcortical T2 hyperintensities are slightly greater than expected for age. The finding is nonspecific but can be seen in the setting of chronic microvascular ischemia, a demyelinating process such as multiple sclerosis, vasculitis, complicated migraine headaches, or as the sequelae of a prior infectious or inflammatory process. 3.  Small polyp or mucous retention cyst of the right maxillary sinus.  MRA HEAD  Findings: Mild tortuosity is present in the cervical right internal carotid artery without significant stenosis.  The internal carotid arteries are otherwise within normal limits.  The study is mildly degraded by patient motion.  The A1 and M1 segments are normal. The MCA bifurcations are within normal limits.  AC and MCA branch vessels are normal.  There is significant signal loss within the right vertebral artery. The left vertebral artery demonstrates mild tapering distally. There is focal signal loss in the proximal basilar artery.  Both posterior cerebral arteries originate from basilar tip.  The PCA branch vessels are  within normal limits.  IMPRESSION:  1.  Asymmetric signal loss within the right vertebral artery suggesting a significant stenosis or occlusion. The artery appears more normal on the T2 axial sequences.  The signal loss may in part be artifactual with significant motion and artifact in the area. Given the lack of other significant findings, this could be confirmed with CTA of the head. 2.  The anterior circulation is within normal limits.   Original Report Authenticated By: Jamesetta Orleans. MATTERN, M.D.    Mr Brain Wo Contrast  09/22/2012  *RADIOLOGY REPORT*  Clinical Data:  History migraine headaches with new worst headache and right-sided weakness.  MRI HEAD WITHOUT CONTRAST MRA HEAD WITHOUT CONTRAST  Technique:  Multiplanar, multiecho pulse sequences of the brain and surrounding structures were obtained without intravenous contrast. Angiographic images of the head were obtained using MRA technique without contrast.  Comparison:  CT head without contrast 09/21/2012.  MRI HEAD  Findings:  Mild susceptibility artifact is secondary to the patient's hair weave.  Accounting for the accounting for the artifact, no acute infarct is present.  Scattered subcortical T2 and FLAIR hyperintensities are slightly greater than expected for age.  No hemorrhage or  mass lesion is present.  Flow is present in the major intracranial arteries.  The globes and orbits are intact.  A polyp or mucous retention cyst is present in the right maxillary sinus.  The paranasal sinuses and mastoid air cells are otherwise clear.  The globes and orbits are intact.  IMPRESSION:  1.  No acute intracranial abnormality. 2.  Scattered subcortical T2 hyperintensities are slightly greater than expected for age. The finding is nonspecific but can be seen in the setting of chronic microvascular ischemia, a demyelinating process such as multiple sclerosis, vasculitis, complicated migraine headaches, or as the sequelae of a prior infectious or inflammatory  process. 3.  Small polyp or mucous retention cyst of the right maxillary sinus.  MRA HEAD  Findings: Mild tortuosity is present in the cervical right internal carotid artery without significant stenosis.  The internal carotid arteries are otherwise within normal limits.  The study is mildly degraded by patient motion.  The A1 and M1 segments are normal. The MCA bifurcations are within normal limits.  AC and MCA branch vessels are normal.  There is significant signal loss within the right vertebral artery. The left vertebral artery demonstrates mild tapering distally. There is focal signal loss in the proximal basilar artery.  Both posterior cerebral arteries originate from basilar tip.  The PCA branch vessels are within normal limits.  IMPRESSION:  1.  Asymmetric signal loss within the right vertebral artery suggesting a significant stenosis or occlusion. The artery appears more normal on the T2 axial sequences.  The signal loss may in part be artifactual with significant motion and artifact in the area. Given the lack of other significant findings, this could be confirmed with CTA of the head. 2.  The anterior circulation is within normal limits.   Original Report Authenticated By: Jamesetta Orleans. MATTERN, M.D.     Medications:  I have reviewed the patient's current medications. Scheduled:   . sodium chloride   Intravenous STAT  . diphenhydrAMINE  12.5 mg Intravenous NOW  . diphenhydrAMINE  25 mg Intravenous Once  . influenza  inactive virus vaccine  0.5 mL Intramuscular Tomorrow-1000  . ketorolac  15 mg Intravenous Once  . ketorolac      . LORazepam  1 mg Intravenous Once  . metoCLOPramide (REGLAN) injection  5 mg Intravenous NOW  . prochlorperazine  10 mg Intravenous Once  . sodium chloride  3 mL Intravenous Q12H  . sodium chloride  3 mL Intravenous Q12H  . valproate sodium  500 mg Intravenous Once  . valproate sodium  500 mg Intravenous Once  . valproate sodium  500 mg Intravenous Once  .  DISCONTD: LORazepam  2 mg Intravenous Once  . DISCONTD: methylPREDNISolone (SOLU-MEDROL) injection  60 mg Intravenous Q8H    Assessment/Plan:  Patient Active Hospital Problem List: Weakness of right side of body (09/22/2012)   Assessment: Improving as headache improves-minimal findings noted on exam today and patient reports improvement as well.   Plan: No further intervention at this time  Migraine (09/22/2012)   Assessment: Improvement in pain scale.  Has clearly has benefit from Depakote.  Would repeat dosing.     Plan:  1.  Would redose Depakote at 500mg  tro 15 minutes.  Will likely be stable for discharge later today.  2.  Recommend Verapamil SR 120mg  qhs for migraine prophylaxis.       LOS: 2 days   Thana Farr, MD [facility] [phone] 09/23/2012  8:38 AM

## 2013-04-30 ENCOUNTER — Emergency Department (HOSPITAL_COMMUNITY)
Admission: EM | Admit: 2013-04-30 | Discharge: 2013-04-30 | Disposition: A | Discharge status: Home / Self Care | Payer: Self-pay | Attending: Emergency Medicine | Admitting: Emergency Medicine

## 2013-04-30 ENCOUNTER — Emergency Department (HOSPITAL_COMMUNITY): Payer: Self-pay

## 2013-04-30 ENCOUNTER — Encounter (HOSPITAL_COMMUNITY): Payer: Self-pay | Admitting: Emergency Medicine

## 2013-04-30 DIAGNOSIS — E079 Disorder of thyroid, unspecified: Secondary | ICD-10-CM | POA: Insufficient documentation

## 2013-04-30 DIAGNOSIS — Y929 Unspecified place or not applicable: Secondary | ICD-10-CM | POA: Insufficient documentation

## 2013-04-30 DIAGNOSIS — J45909 Unspecified asthma, uncomplicated: Secondary | ICD-10-CM | POA: Insufficient documentation

## 2013-04-30 DIAGNOSIS — F17200 Nicotine dependence, unspecified, uncomplicated: Secondary | ICD-10-CM | POA: Insufficient documentation

## 2013-04-30 DIAGNOSIS — G43909 Migraine, unspecified, not intractable, without status migrainosus: Secondary | ICD-10-CM | POA: Insufficient documentation

## 2013-04-30 DIAGNOSIS — E7800 Pure hypercholesterolemia, unspecified: Secondary | ICD-10-CM | POA: Insufficient documentation

## 2013-04-30 DIAGNOSIS — I10 Essential (primary) hypertension: Secondary | ICD-10-CM | POA: Insufficient documentation

## 2013-04-30 DIAGNOSIS — W1809XA Striking against other object with subsequent fall, initial encounter: Secondary | ICD-10-CM | POA: Insufficient documentation

## 2013-04-30 DIAGNOSIS — Y9389 Activity, other specified: Secondary | ICD-10-CM | POA: Insufficient documentation

## 2013-04-30 DIAGNOSIS — S6390XA Sprain of unspecified part of unspecified wrist and hand, initial encounter: Secondary | ICD-10-CM | POA: Insufficient documentation

## 2013-04-30 DIAGNOSIS — Z79899 Other long term (current) drug therapy: Secondary | ICD-10-CM | POA: Insufficient documentation

## 2013-04-30 DIAGNOSIS — Z886 Allergy status to analgesic agent status: Secondary | ICD-10-CM | POA: Insufficient documentation

## 2013-04-30 DIAGNOSIS — Z880 Allergy status to penicillin: Secondary | ICD-10-CM | POA: Insufficient documentation

## 2013-04-30 DIAGNOSIS — S63619A Unspecified sprain of unspecified finger, initial encounter: Secondary | ICD-10-CM

## 2013-04-30 MED ORDER — IBUPROFEN 800 MG PO TABS: 800.0000 mg | ORAL_TABLET | Freq: Three times a day (TID) | ORAL | Status: DC

## 2013-04-30 NOTE — ED Notes (Signed)
Pt c/o right middle finger injury with swelling x 2 days ago; swelling noted

## 2013-04-30 NOTE — Progress Notes (Signed)
Orthopedic Tech Progress Note Patient Details:  Wendy Hoffman [birth year] [id number] Applied finger splint to Rt. third finger. Ortho Devices Type of Ortho Device: Finger splint Ortho Device/Splint Location: Rt. third finger. Ortho Device/Splint Interventions: Application   Lesle Chris 04/30/2013, 3:27 PM

## 2013-04-30 NOTE — ED Provider Notes (Signed)
History     CSN: [id number]  Arrival date & time 04/30/13  1253   First MD Initiated Contact with Patient 04/30/13 1436      Chief Complaint  Patient presents with  . Finger Injury    (Consider location/radiation/quality/duration/timing/severity/associated sxs/prior treatment) HPI Comments: Patient presents emergency department with chief complaint of right middle finger pain and swelling. She states that 2 days ago she was playing with her child, and she fell and landed on her finger. She states that her pain is mild to moderate. It is worse with movement. She has not tried taking anything to alleviate her symptoms. The pain does not radiate.  The history is provided by the patient. No language interpreter was used.    Past Medical History  Diagnosis Date  . Asthma   . Thyroid disease   . Hypertension   . High cholesterol   . Migraine   . Morbid obesity   . H/O: hysterectomy     Past Surgical History  Procedure Laterality Date  . Cesarean section      3 occasions  . Thyroidectomy, partial      Goiter  . Cholecystectomy      Family History  Problem Relation Age of Onset  . Migraines Sister     History  Substance Use Topics  . Smoking status: Light Tobacco Smoker    Types: Cigarettes  . Smokeless tobacco: Not on file  . Alcohol Use: No    OB History   Grav Para Term Preterm Abortions TAB SAB Ect Mult Living                  Review of Systems  All other systems reviewed and are negative.    Allergies  Mango flavor; Aspirin; Blueberry; and Penicillins  Home Medications   Current Outpatient Rx  Name  Route  Sig  Dispense  Refill  . albuterol (PROVENTIL HFA;VENTOLIN HFA) 108 (90 BASE) MCG/ACT inhaler   Inhalation   Inhale 2 puffs into the lungs every 6 (six) hours as needed for wheezing or shortness of breath.         . diphenhydrAMINE (BENADRYL) 25 MG tablet   Oral   Take 50 mg by mouth every 6 (six) hours as needed for itching or  allergies.         Marland Kitchen ibuprofen (ADVIL,MOTRIN) 200 MG tablet   Oral   Take 800 mg by mouth every 6 (six) hours as needed for pain or fever.         Marland Kitchen ibuprofen (ADVIL,MOTRIN) 800 MG tablet   Oral   Take 1 tablet (800 mg total) by mouth 3 (three) times daily.   21 tablet   0     BP 158/98  Pulse 80  Temp(Src) 98.2 F (36.8 C) (Oral)  Resp 18  SpO2 100%  Physical Exam  Nursing note and vitals reviewed. Constitutional: She is oriented to person, place, and time. She appears well-developed and well-nourished.  HENT:  Head: Normocephalic and atraumatic.  Eyes: Conjunctivae and EOM are normal.  Neck: Normal range of motion.  Cardiovascular: Normal rate.   Brisk capillary refill, intact distal pulses  Pulmonary/Chest: Effort normal.  Abdominal: She exhibits no distension.  Musculoskeletal: Normal range of motion.  Right-sided middle finger mildly swollen, range of motion 4/5, strength 4/5, limited secondary to pain, no bony abnormality or deformity  Neurological: She is alert and oriented to person, place, and time.  sensation and strength intact  Skin: Skin is  dry.  Psychiatric: She has a normal mood and affect. Her behavior is normal. Judgment and thought content normal.    ED Course  Procedures (including critical care time)  Labs Reviewed - No data to display Dg Finger Middle Right  04/30/2013   *RADIOLOGY REPORT*  Clinical Data: Finger injury  RIGHT MIDDLE FINGER 2+V  Comparison: 04/21/2012  Findings: Three views of the right middle finger submitted.  No acute fracture or subluxation.  IMPRESSION: No acute fracture or subluxation.   Original Report Authenticated By: Natasha Mead, M.D.     1. Finger sprain, initial encounter       MDM  Patient was finger sprain, likely from jamming her finger from a fall. Will treat with finger splint, ibuprofen, and rice therapy. Followup with orthopedic hand if needed. Patient understands and agrees with the plan. She is stable  and ready for discharge.        Roxy Horseman, PA-C 04/30/13 [phone]

## 2013-05-01 NOTE — ED Provider Notes (Signed)
Medical screening examination/treatment/procedure(s) were performed by non-physician practitioner and as supervising physician I was immediately available for consultation/collaboration.  Raeford Razor, MD 05/01/13 [phone]

## 2013-06-26 ENCOUNTER — Encounter (HOSPITAL_COMMUNITY): Payer: Self-pay

## 2013-06-26 ENCOUNTER — Emergency Department (HOSPITAL_COMMUNITY)
Admission: EM | Admit: 2013-06-26 | Discharge: 2013-06-27 | Disposition: A | Discharge status: Home / Self Care | Payer: Self-pay | Attending: Emergency Medicine | Admitting: Emergency Medicine

## 2013-06-26 ENCOUNTER — Emergency Department (HOSPITAL_COMMUNITY): Payer: Self-pay

## 2013-06-26 DIAGNOSIS — R51 Headache: Secondary | ICD-10-CM | POA: Insufficient documentation

## 2013-06-26 DIAGNOSIS — Z880 Allergy status to penicillin: Secondary | ICD-10-CM | POA: Insufficient documentation

## 2013-06-26 DIAGNOSIS — F17200 Nicotine dependence, unspecified, uncomplicated: Secondary | ICD-10-CM | POA: Insufficient documentation

## 2013-06-26 DIAGNOSIS — R519 Headache, unspecified: Secondary | ICD-10-CM

## 2013-06-26 DIAGNOSIS — Z8679 Personal history of other diseases of the circulatory system: Secondary | ICD-10-CM | POA: Insufficient documentation

## 2013-06-26 DIAGNOSIS — Z79899 Other long term (current) drug therapy: Secondary | ICD-10-CM | POA: Insufficient documentation

## 2013-06-26 DIAGNOSIS — I10 Essential (primary) hypertension: Secondary | ICD-10-CM | POA: Insufficient documentation

## 2013-06-26 DIAGNOSIS — Z862 Personal history of diseases of the blood and blood-forming organs and certain disorders involving the immune mechanism: Secondary | ICD-10-CM | POA: Insufficient documentation

## 2013-06-26 DIAGNOSIS — H538 Other visual disturbances: Secondary | ICD-10-CM | POA: Insufficient documentation

## 2013-06-26 DIAGNOSIS — J45909 Unspecified asthma, uncomplicated: Secondary | ICD-10-CM | POA: Insufficient documentation

## 2013-06-26 DIAGNOSIS — R0789 Other chest pain: Secondary | ICD-10-CM | POA: Insufficient documentation

## 2013-06-26 DIAGNOSIS — Z8639 Personal history of other endocrine, nutritional and metabolic disease: Secondary | ICD-10-CM | POA: Insufficient documentation

## 2013-06-26 DIAGNOSIS — Z90710 Acquired absence of both cervix and uterus: Secondary | ICD-10-CM | POA: Insufficient documentation

## 2013-06-26 LAB — BASIC METABOLIC PANEL
BUN: 10 mg/dL (ref 6–23)
CO2: 28 mEq/L (ref 19–32)
Calcium: 9.3 mg/dL (ref 8.4–10.5)
Chloride: 101 mEq/L (ref 96–112)
Creatinine, Ser: 0.67 mg/dL (ref 0.50–1.10)
GFR calc Af Amer: >90 mL/min (ref >90)
GFR calc non Af Amer: >90 mL/min (ref >90)
Glucose, Bld: 95 mg/dL (ref 70–99)
Potassium: 2.8 mEq/L — ABNORMAL LOW (ref 3.5–5.1)
Sodium: 140 mEq/L (ref 135–145)

## 2013-06-26 LAB — CBC
HCT: 40 % (ref 36.0–46.0)
Hemoglobin: 13.5 g/dL (ref 12.0–15.0)
MCH: 30.6 pg (ref 26.0–34.0)
MCHC: 33.8 g/dL (ref 30.0–36.0)
MCV: 90.7 fL (ref 78.0–100.0)
Platelets: 309 10*3/uL (ref 150–400)
RBC: 4.41 MIL/uL (ref 3.87–5.11)
RDW: 12.6 % (ref 11.5–15.5)
WBC: 6.9 10*3/uL (ref 4.0–10.5)

## 2013-06-26 LAB — URINALYSIS, ROUTINE W REFLEX MICROSCOPIC
Bilirubin Urine: NEGATIVE
Glucose, UA: NEGATIVE mg/dL
Hgb urine dipstick: NEGATIVE
Ketones, ur: NEGATIVE mg/dL
Nitrite: POSITIVE — AB
Protein, ur: NEGATIVE mg/dL
Specific Gravity, Urine: 1.013 (ref 1.005–1.030)
Urobilinogen, UA: 0.2 mg/dL (ref 0.0–1.0)
pH: 6 (ref 5.0–8.0)

## 2013-06-26 LAB — URINE MICROSCOPIC-ADD ON

## 2013-06-26 MED ORDER — FENTANYL CITRATE 0.05 MG/ML IJ SOLN
100.0000 ug | Freq: Once | INTRAMUSCULAR | Status: AC
  Administered 2013-06-26: 100 ug via INTRAVENOUS
  Filled 2013-06-26: qty 2

## 2013-06-26 MED ORDER — IPRATROPIUM BROMIDE 0.02 % IN SOLN
0.5000 mg | Freq: Once | RESPIRATORY_TRACT | Status: AC
  Administered 2013-06-26: 0.5 mg via RESPIRATORY_TRACT
  Filled 2013-06-26: qty 2.5

## 2013-06-26 MED ORDER — POTASSIUM CHLORIDE CRYS ER 20 MEQ PO TBCR
40.0000 meq | EXTENDED_RELEASE_TABLET | Freq: Once | ORAL | Status: AC
  Administered 2013-06-26: 40 meq via ORAL
  Filled 2013-06-26: qty 2

## 2013-06-26 MED ORDER — KETOROLAC TROMETHAMINE 30 MG/ML IJ SOLN
30.0000 mg | Freq: Once | INTRAMUSCULAR | Status: AC
  Administered 2013-06-26: 30 mg via INTRAVENOUS
  Filled 2013-06-26: qty 1

## 2013-06-26 MED ORDER — ALBUTEROL SULFATE (5 MG/ML) 0.5% IN NEBU
5.0000 mg | INHALATION_SOLUTION | Freq: Once | RESPIRATORY_TRACT | Status: AC
  Administered 2013-06-26: 5 mg via RESPIRATORY_TRACT
  Filled 2013-06-26: qty 1

## 2013-06-26 MED ORDER — PROCHLORPERAZINE EDISYLATE 5 MG/ML IJ SOLN
10.0000 mg | Freq: Once | INTRAMUSCULAR | Status: AC
  Administered 2013-06-26: 10 mg via INTRAVENOUS
  Filled 2013-06-26: qty 2

## 2013-06-26 MED ORDER — SODIUM CHLORIDE 0.9 % IV BOLUS (SEPSIS)
1000.0000 mL | Freq: Once | INTRAVENOUS | Status: AC
  Administered 2013-06-26: 1000 mL via INTRAVENOUS

## 2013-06-26 MED ORDER — POTASSIUM CHLORIDE 10 MEQ/100ML IV SOLN
10.0000 meq | Freq: Once | INTRAVENOUS | Status: AC
  Administered 2013-06-26: 10 meq via INTRAVENOUS
  Filled 2013-06-26: qty 100

## 2013-06-26 NOTE — ED Notes (Signed)
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 Pt c/o headaches and dizziness, numbness to rt arm x2 hrs

## 2013-06-26 NOTE — ED Notes (Signed)
Pt states can't afford b/p meds x87months

## 2013-06-27 MED ORDER — HYDROCHLOROTHIAZIDE 25 MG PO TABS: 25.0000 mg | ORAL_TABLET | Freq: Every day | ORAL | Status: DC

## 2013-06-27 NOTE — ED Provider Notes (Signed)
Medical screening examination/treatment/procedure(s) were performed by non-physician practitioner and as supervising physician I was immediately available for consultation/collaboration.  Aria Pickrell R. Kadeidra Coryell, MD 06/27/13 0015 

## 2013-06-27 NOTE — ED Provider Notes (Signed)
History    CSN: [id number] Arrival date & time 06/26/13  1814  First MD Initiated Contact with Patient 06/26/13 1903     Chief Complaint  Patient presents with  . Hypertension    hasn't taken b/p meds x53months   (Consider location/radiation/quality/duration/timing/severity/associated sxs/prior Treatment) HPI Patient presents emergency department with elevated blood pressure.  Patient, states, that she's also had some headache since Friday.  She states, that she's had some intermittent chest pressure, mostly on the right side of her chest.  Patient also, states, that she has asthma and has been bothering her some lately.  Patient denies nausea, vomiting, diarrhea, abdominal pain, back pain, fever, cough, dizziness, or syncope.  The patient also denies any issues with urination.  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 Past Medical History  Diagnosis Date  . Asthma   . Thyroid disease   . Hypertension   . High cholesterol   . Migraine   . Morbid obesity   . H/O: hysterectomy    Past Surgical History  Procedure Laterality Date  . Cesarean section      3 occasions  . Thyroidectomy, partial      Goiter  . Cholecystectomy     Family History  Problem Relation Age of Onset  . Migraines Sister    History  Substance Use Topics  . Smoking status: Current Every Day Smoker    Types: Cigarettes  . Smokeless tobacco: Not on file  . Alcohol Use: No   OB History   Grav Para Term Preterm Abortions TAB SAB Ect Mult Living                 Review of Systems All other systems negative except as documented in the HPI. All pertinent positives and negatives as reviewed in the HPI. Allergies  Mango flavor; Aspirin; Blueberry; and Penicillins  Home Medications   Current Outpatient Rx  Name  Route  Sig  Dispense  Refill  . albuterol (PROVENTIL HFA;VENTOLIN HFA) 108 (90 BASE) MCG/ACT inhaler   Inhalation   Inhale 2 puffs into the lungs  every 6 (six) hours as needed for wheezing or shortness of breath.         . diphenhydrAMINE (BENADRYL) 25 MG tablet   Oral   Take 50 mg by mouth every 6 (six) hours as needed for itching or allergies.         Marland Kitchen ibuprofen (ADVIL,MOTRIN) 200 MG tablet   Oral   Take 800 mg by mouth every 8 (eight) hours as needed for pain or fever.           BP 139/83  Pulse 75  Temp(Src) 97.6 F (36.4 C) (Oral)  Resp 14  SpO2 100% Physical Exam  Nursing note and vitals reviewed. Constitutional: She is oriented to person, place, and time. She appears well-developed and well-nourished. No distress.  HENT:  Head: Normocephalic and atraumatic.  Mouth/Throat: Oropharynx is clear and moist.  Eyes: EOM are normal. Pupils are equal, round, and reactive to light.  Neck: Normal range of motion. Neck supple.  Cardiovascular: Normal rate, regular rhythm and normal heart sounds.  Exam reveals no gallop and no friction rub.   No murmur heard. Pulmonary/Chest: Effort normal and breath sounds normal.  Neurological: She is alert and oriented to person, place, and time. She exhibits normal muscle tone. Coordination normal.  Skin: Skin is warm and dry.    ED Course  Procedures (including critical  care time) Labs Reviewed  BASIC METABOLIC PANEL - Abnormal; Notable for the following:    Potassium 2.8 (*)    All other components within normal limits  URINALYSIS, ROUTINE W REFLEX MICROSCOPIC - Abnormal; Notable for the following:    APPearance CLOUDY (*)    Nitrite POSITIVE (*)    Leukocytes, UA SMALL (*)    All other components within normal limits  URINE MICROSCOPIC-ADD ON - Abnormal; Notable for the following:    Squamous Epithelial / LPF FEW (*)    Bacteria, UA MANY (*)    All other components within normal limits  URINE CULTURE  CBC  POCT I-STAT TROPONIN I   Dg Chest 2 View  06/26/2013   *RADIOLOGY REPORT*  Clinical Data: Chest pain.  CHEST - 2 VIEW  Comparison: 10/17/2010  Findings: Two views  of the chest demonstrate clear lungs. Heart and mediastinum are within normal limits.  The trachea is midline. Bony thorax is intact.  IMPRESSION: No acute cardiopulmonary disease.   Original Report Authenticated By: Richarda Overlie, M.D.   The patient has a vague account for some blurred vision, that has been ongoing patient's headache is similar to her previous migraine-type headaches.  Patient does not have any focal neurological deficits and no sign of hypertensive crisis.  Patient be referred to the [facility], adult medicine clinic.  MDM  MDM Reviewed: nursing note, vitals and previous chart Interpretation: labs, ECG and x-ray   Date: 06/27/2013  Rate: 79  Rhythm: normal sinus rhythm  QRS Axis: normal  Intervals: normal  ST/T Wave abnormalities: normal  Conduction Disutrbances:none  Narrative Interpretation:   Old EKG Reviewed: unchanged      Carlyle Dolly, PA-C 06/27/13 0014

## 2013-06-28 LAB — URINE CULTURE: Colony Count: >=100000

## 2013-06-29 NOTE — Progress Notes (Signed)
  ED Antimicrobial Stewardship Positive Culture Follow Up   Wendy Hoffman is an 47 y.o. female who presented to [facility] on 06/26/2013 with a chief complaint of  Chief Complaint  Patient presents with  . Hypertension    hasn't taken b/p meds x83months    Recent Results (from the past 720 hour(s))  URINE CULTURE     Status: None   Collection Time    06/26/13  8:32 PM      Result Value Range Status   Specimen Description URINE, CLEAN CATCH   Final   Special Requests NONE   Final   Culture  Setup Time 06/27/2013 03:01   Final   Colony Count >=100,000 COLONIES/ML   Final   Culture KLEBSIELLA PNEUMONIAE   Final   Report Status 06/28/2013 FINAL   Final   Organism ID, Bacteria KLEBSIELLA PNEUMONIAE   Final    []  Treated with , organism resistant to prescribed antimicrobial [x]  Patient discharged originally without antimicrobial agent and treatment is now indicated  Recommendation: Perform symptom check. If UTI symptoms (frequency, urgency, dysuria, etc) are present, start new prescription below.  New antibiotic prescription: Cipro 500mg  PO BID x 5 days  ED Provider: Elpidio Anis, PA-C   Cleon Dew 06/29/2013, 2:32 PM Infectious Diseases Pharmacist Phone# [phone]

## 2013-06-29 NOTE — ED Notes (Signed)
Post ED Visit - Positive Culture Follow-up: Successful Patient Follow-Up  Culture assessed and recommendations reviewed by: [x]  Wes Dulaney, Pharm.D., BCPS []  Celedonio Miyamoto, [address].D., BCPS []  Georgina Pillion, [address].D., BCPS []  [place], [place].D., BCPS, AAHIVP []  Estella Husk, Pharm.D., BCPS, AAHIVP  Positive Urine culture  []  Patient discharged without antimicrobial prescription and treatment is now indicated [x]  Organism is resistant to prescribed ED discharge antimicrobial []  Patient with positive blood cultures  Changes discussed with ED provider: Elpidio Anis  New antibiotic prescription Cipro 500 po  BID x 5 days  Called to [facility] [phone] by Sheralyn Boatman PFM  Contacted patient Patient informed of positive results date 06/29/2013 time 11:28  Larena Sox 06/29/2013, 11:25 AM

## 2013-08-14 ENCOUNTER — Encounter (HOSPITAL_COMMUNITY): Payer: Self-pay

## 2013-08-14 ENCOUNTER — Emergency Department (HOSPITAL_COMMUNITY)
Admission: EM | Admit: 2013-08-14 | Discharge: 2013-08-14 | Disposition: A | Discharge status: Home / Self Care | Payer: Self-pay | Attending: Emergency Medicine | Admitting: Emergency Medicine

## 2013-08-14 DIAGNOSIS — H53149 Visual discomfort, unspecified: Secondary | ICD-10-CM | POA: Insufficient documentation

## 2013-08-14 DIAGNOSIS — Z79899 Other long term (current) drug therapy: Secondary | ICD-10-CM | POA: Insufficient documentation

## 2013-08-14 DIAGNOSIS — F17200 Nicotine dependence, unspecified, uncomplicated: Secondary | ICD-10-CM | POA: Insufficient documentation

## 2013-08-14 DIAGNOSIS — Z880 Allergy status to penicillin: Secondary | ICD-10-CM | POA: Insufficient documentation

## 2013-08-14 DIAGNOSIS — G43909 Migraine, unspecified, not intractable, without status migrainosus: Secondary | ICD-10-CM | POA: Insufficient documentation

## 2013-08-14 DIAGNOSIS — R110 Nausea: Secondary | ICD-10-CM | POA: Insufficient documentation

## 2013-08-14 DIAGNOSIS — J45909 Unspecified asthma, uncomplicated: Secondary | ICD-10-CM | POA: Insufficient documentation

## 2013-08-14 DIAGNOSIS — I10 Essential (primary) hypertension: Secondary | ICD-10-CM | POA: Insufficient documentation

## 2013-08-14 DIAGNOSIS — H538 Other visual disturbances: Secondary | ICD-10-CM | POA: Insufficient documentation

## 2013-08-14 MED ORDER — DIPHENHYDRAMINE HCL 50 MG/ML IJ SOLN
25.0000 mg | Freq: Once | INTRAMUSCULAR | Status: AC
  Administered 2013-08-14: 25 mg via INTRAMUSCULAR
  Filled 2013-08-14: qty 1

## 2013-08-14 MED ORDER — METOCLOPRAMIDE HCL 5 MG/ML IJ SOLN
10.0000 mg | Freq: Once | INTRAMUSCULAR | Status: AC
  Administered 2013-08-14: 10 mg via INTRAMUSCULAR
  Filled 2013-08-14: qty 2

## 2013-08-14 MED ORDER — KETOROLAC TROMETHAMINE 30 MG/ML IJ SOLN
60.0000 mg | Freq: Once | INTRAMUSCULAR | Status: AC
  Administered 2013-08-14: 60 mg via INTRAMUSCULAR
  Filled 2013-08-14 (×2): qty 1

## 2013-08-14 NOTE — ED Notes (Signed)
Pt c/o migraine headache with nausea, dizziness; c/o shoulder and neck pain; denies vomiting

## 2013-08-14 NOTE — ED Provider Notes (Signed)
CSN: [id number]     Arrival date & time 08/14/13  1621 History  This chart was scribed for non-physician practitioner, Antony Madura, PA-C working with Juliet Rude. Rubin Payor, MD by Greggory Stallion, ED scribe. This patient was seen in room WTR6/WTR6 and the patient's care was started at 5:57 PM.  Chief Complaint  Patient presents with  . Migraine   Patient is a 47 y.o. female presenting with migraines. The history is provided by the patient. No language interpreter was used.  Migraine This is a new problem. The current episode started more than 2 days ago. The problem occurs constantly. The problem has not changed since onset.Associated symptoms include headaches. Exacerbated by: light, noise. Nothing relieves the symptoms. Treatments tried: ibuprofen. The treatment provided no relief.    HPI Comments: Wendy Hoffman is a 47 y.o. female who presents to the Emergency Department complaining of gradual onset, constant sharp, aching migraine with associated mild blurry vision and photophobia that started 3 days ago. Pain has been waxing and waning in severity. She states she has also has some nausea. Pt denies head injury or trauma. Pt states this is worse than her typical migraines. She has tried ibuprofen with no relief. Pt denies fever, facial droop, hearing loss, voice change, trouble swallowing and emesis. She states she normally comes to [facility] when her migraines get really bad and what she is given in the ED helps relieve the symptoms.   Past Medical History  Diagnosis Date  . Asthma   . Thyroid disease   . Hypertension   . High cholesterol   . Migraine   . Morbid obesity   . H/O: hysterectomy    Past Surgical History  Procedure Laterality Date  . Cesarean section      3 occasions  . Thyroidectomy, partial      Goiter  . Cholecystectomy    . Abdominal hysterectomy     Family History  Problem Relation Age of Onset  . Migraines Sister    History  Substance Use Topics  . Smoking  status: Current Every Day Smoker    Types: Cigarettes  . Smokeless tobacco: Not on file  . Alcohol Use: No   OB History   Grav Para Term Preterm Abortions TAB SAB Ect Mult Living                 Review of Systems  Constitutional: Negative for fever.  HENT: Negative for hearing loss, trouble swallowing and voice change.   Eyes: Positive for photophobia and visual disturbance.  Gastrointestinal: Positive for nausea. Negative for vomiting.  Neurological: Positive for headaches.  All other systems reviewed and are negative.   Allergies  Mango flavor; Aspirin; Blueberry; and Penicillins  Home Medications   Current Outpatient Rx  Name  Route  Sig  Dispense  Refill  . albuterol (PROVENTIL HFA;VENTOLIN HFA) 108 (90 BASE) MCG/ACT inhaler   Inhalation   Inhale 2 puffs into the lungs every 6 (six) hours as needed for wheezing or shortness of breath.         . hydrochlorothiazide (HYDRODIURIL) 25 MG tablet   Oral   Take 1 tablet (25 mg total) by mouth daily.   30 tablet   0   . ibuprofen (ADVIL,MOTRIN) 200 MG tablet   Oral   Take 800 mg by mouth every 8 (eight) hours as needed for pain or fever.           BP 134/78  Pulse 78  Temp(Src) 98.3  F (36.8 C) (Oral)  Resp 18  Ht 5' 4.5" (1.638 m)  Wt 272 lb (123.378 kg)  BMI 45.98 kg/m2  SpO2 97%  Physical Exam  Nursing note and vitals reviewed. Constitutional: She is oriented to person, place, and time. She appears well-developed and well-nourished. No distress.  HENT:  Head: Normocephalic and atraumatic.  Eyes: Conjunctivae and EOM are normal. No scleral icterus.  Neck: Normal range of motion.  Patient moves neck with knees. No tenderness to palpation cervical midline. No nuchal rigidity or meningeal signs.  Cardiovascular: Normal rate, regular rhythm, normal heart sounds and intact distal pulses.   Distal and radial pulses 2+ bilaterally.   Pulmonary/Chest: Effort normal and breath sounds normal. No respiratory  distress. She has no wheezes. She has no rales.  Musculoskeletal: Normal range of motion.  Neurological: She is alert and oriented to person, place, and time.  Patient speaks in full goal oriented sentences. Cranial nerves III through XII grossly intact. Patient has equal grip strength bilaterally with 5 out of 5 strength and resistance in her upper and lower extremities. DTRs normal and symmetric. No sensory or motor deficits appreciated.  Skin: Skin is warm and dry. No rash noted. She is not diaphoretic. No erythema. No pallor.  Psychiatric: She has a normal mood and affect. Her behavior is normal.    ED Course  Procedures (including critical care time)  DIAGNOSTIC STUDIES: Oxygen Saturation is 97% on RA, normal by my interpretation.    COORDINATION OF CARE: 6:10 PM-Discussed treatment plan which includes migraine medications with pt at bedside and pt agreed to plan.   7:33 PM-Upon recheck, pt states her migraine is starting to feel better and she is ready to go home.   Labs Review Labs Reviewed - No data to display  Imaging Review No results found.  MDM   1. Migraine    Patient with a history of migraine headaches who presents for a migraine with onset 3 days ago. Patient without focal neurologic deficits. She is well and nontoxic appearing, hemodynamically stable, and afebrile. No nuchal rigidity or meningeal signs appreciated. Patient treated in the ED with IM Toradol, Reglan, and Benadryl with relief of symptoms. Patient states that pain has decreased to a 2/10. Do not believe further workup with imaging is warranted at this time. Patient appropriate for discharge with primary care followup for further evaluation of symptoms. Rest and adequate fluid hydration advised. Return precautions discussed and patient are agreeable to plan with no unaddressed concerns.   I personally performed the services described in this documentation, which was scribed in my presence. The recorded  information has been reviewed and is accurate.    Antony Madura, PA-C 08/18/13 [phone]

## 2013-08-22 NOTE — ED Provider Notes (Signed)
Medical screening examination/treatment/procedure(s) were performed by non-physician practitioner and as supervising physician I was immediately available for consultation/collaboration.  Wilhelm Ganaway R. Sojourner Behringer, MD 08/22/13 1531 

## 2013-09-30 ENCOUNTER — Encounter (HOSPITAL_COMMUNITY): Payer: Self-pay | Admitting: Emergency Medicine

## 2013-09-30 ENCOUNTER — Emergency Department (HOSPITAL_COMMUNITY)
Admission: EM | Admit: 2013-09-30 | Discharge: 2013-09-30 | Disposition: A | Discharge status: Home / Self Care | Payer: Managed Care, Other (non HMO) | Attending: Emergency Medicine | Admitting: Emergency Medicine

## 2013-09-30 ENCOUNTER — Telehealth (HOSPITAL_COMMUNITY): Payer: Self-pay | Admitting: Emergency Medicine

## 2013-09-30 DIAGNOSIS — F17200 Nicotine dependence, unspecified, uncomplicated: Secondary | ICD-10-CM | POA: Insufficient documentation

## 2013-09-30 DIAGNOSIS — IMO0002 Reserved for concepts with insufficient information to code with codable children: Secondary | ICD-10-CM | POA: Insufficient documentation

## 2013-09-30 DIAGNOSIS — I10 Essential (primary) hypertension: Secondary | ICD-10-CM | POA: Insufficient documentation

## 2013-09-30 DIAGNOSIS — Z8679 Personal history of other diseases of the circulatory system: Secondary | ICD-10-CM | POA: Insufficient documentation

## 2013-09-30 DIAGNOSIS — Z880 Allergy status to penicillin: Secondary | ICD-10-CM | POA: Insufficient documentation

## 2013-09-30 DIAGNOSIS — Z8639 Personal history of other endocrine, nutritional and metabolic disease: Secondary | ICD-10-CM | POA: Insufficient documentation

## 2013-09-30 DIAGNOSIS — Z862 Personal history of diseases of the blood and blood-forming organs and certain disorders involving the immune mechanism: Secondary | ICD-10-CM | POA: Insufficient documentation

## 2013-09-30 DIAGNOSIS — Z79899 Other long term (current) drug therapy: Secondary | ICD-10-CM | POA: Insufficient documentation

## 2013-09-30 DIAGNOSIS — Z90710 Acquired absence of both cervix and uterus: Secondary | ICD-10-CM | POA: Insufficient documentation

## 2013-09-30 DIAGNOSIS — J45901 Unspecified asthma with (acute) exacerbation: Secondary | ICD-10-CM | POA: Insufficient documentation

## 2013-09-30 MED ORDER — ALBUTEROL SULFATE HFA 108 (90 BASE) MCG/ACT IN AERS
2.0000 | INHALATION_SPRAY | Freq: Once | RESPIRATORY_TRACT | Status: AC
  Administered 2013-09-30: 2 via RESPIRATORY_TRACT
  Filled 2013-09-30: qty 6.7

## 2013-09-30 MED ORDER — PREDNISONE 20 MG PO TABS: 40.0000 mg | ORAL_TABLET | Freq: Every day | ORAL | Status: DC

## 2013-09-30 MED ORDER — ALBUTEROL SULFATE (5 MG/ML) 0.5% IN NEBU
2.5000 mg | INHALATION_SOLUTION | Freq: Once | RESPIRATORY_TRACT | Status: AC
  Administered 2013-09-30: 2.5 mg via RESPIRATORY_TRACT
  Filled 2013-09-30: qty 0.5

## 2013-09-30 MED ORDER — IPRATROPIUM BROMIDE 0.02 % IN SOLN
0.5000 mg | Freq: Once | RESPIRATORY_TRACT | Status: AC
  Administered 2013-09-30: 0.5 mg via RESPIRATORY_TRACT
  Filled 2013-09-30: qty 2.5

## 2013-09-30 MED ORDER — PREDNISONE 20 MG PO TABS
60.0000 mg | ORAL_TABLET | Freq: Once | ORAL | Status: AC
  Administered 2013-09-30: 60 mg via ORAL
  Filled 2013-09-30: qty 3

## 2013-09-30 NOTE — ED Notes (Signed)
Pt presents to the ED with shortness of breath as an exacerbation of asthma.  Pt has run out of her inhaler and has been unable to refill.  Pt has inspiratory and expiratory wheezing. Pt has had a persistent cough for 4 days.

## 2013-09-30 NOTE — ED Provider Notes (Signed)
CSN: [id number]     Arrival date & time 09/30/13  4098 History   None    Chief Complaint  Patient presents with  . Shortness of Breath    HPI  Wendy Hoffman is a 47 y.o. female with a PMH of asthma, thyroid disease, HTN, HLD, and migraines who presents to the ED for evaluation of SOB.  History was provided by the patient.  Patient states that she's had a nonproductive cough for the past 4 days. Is also had some rhinorrhea and congestion. She states that she has a history of asthma and has run out of her inhaler and did not have any refills. She states that her symptoms today are similar to asthma exacerbations that she's had in the past. She states that she was wheezing and unable to get relief at home which is why she came to the emergency department. She states that she had some chest "discomfort due to not being able to breathe" which has completely resolved after receiving a breathing treatment in emergency department.  No chest pain or discomfort currently.  She states that otherwise she is a well with no fevers, change in appetite or activity, abdominal pain, nausea, emesis, leg edema, or headache.  She states that she is an occasional smoker.  She states that URI symptoms are a trigger for her asthma.  No hx of MI/heart problems or clotting.     Past Medical History  Diagnosis Date  . Asthma   . Thyroid disease   . Hypertension   . High cholesterol   . Migraine   . Morbid obesity   . H/O: hysterectomy    Past Surgical History  Procedure Laterality Date  . Cesarean section      3 occasions  . Thyroidectomy, partial      Goiter  . Cholecystectomy    . Abdominal hysterectomy     Family History  Problem Relation Age of Onset  . Migraines Sister    History  Substance Use Topics  . Smoking status: Current Every Day Smoker    Types: Cigarettes  . Smokeless tobacco: Not on file  . Alcohol Use: No   OB History   Grav Para Term Preterm Abortions TAB SAB Ect Mult Living                  Review of Systems  Constitutional: Negative for fever, chills, activity change, appetite change and fatigue.  HENT: Positive for congestion and rhinorrhea. Negative for mouth sores, sinus pressure, sore throat, tinnitus and voice change.   Eyes: Negative for visual disturbance.  Respiratory: Positive for cough, shortness of breath and wheezing.   Cardiovascular: Negative for chest pain, palpitations and leg swelling.  Gastrointestinal: Negative for nausea, vomiting, abdominal pain, diarrhea and constipation.  Genitourinary: Negative for dysuria and hematuria.  Musculoskeletal: Negative for gait problem, myalgias and neck pain.  Skin: Negative for color change, rash and wound.  Neurological: Negative for dizziness, syncope, weakness, light-headedness, numbness and headaches.    Allergies  Mango flavor; Aspirin; Blueberry; and Penicillins  Home Medications   Current Outpatient Rx  Name  Route  Sig  Dispense  Refill  . albuterol (PROVENTIL HFA;VENTOLIN HFA) 108 (90 BASE) MCG/ACT inhaler   Inhalation   Inhale 2 puffs into the lungs every 6 (six) hours as needed for wheezing or shortness of breath.         . budesonide-formoterol (SYMBICORT) 160-4.5 MCG/ACT inhaler   Inhalation   Inhale 2 puffs into  the lungs 2 (two) times daily.         . hydrochlorothiazide (HYDRODIURIL) 25 MG tablet   Oral   Take 1 tablet (25 mg total) by mouth daily.   30 tablet   0    BP 128/97  Pulse 79  Temp(Src) 98.2 F (36.8 C) (Oral)  Resp 24  SpO2 99%  Filed Vitals:   09/30/13 0447 09/30/13 0618 09/30/13 0827  BP: 128/97  129/89  Pulse: 109 79 95  Temp: 98.2 F (36.8 C)  98.3 F (36.8 C)  TempSrc: Oral  Oral  Resp: 24  18  SpO2: 99% 99% 97%     Physical Exam  Nursing note and vitals reviewed. Constitutional: She is oriented to person, place, and time. She appears well-developed and well-nourished. No distress.  HENT:  Head: Normocephalic and atraumatic.  Right Ear:  External ear normal.  Left Ear: External ear normal.  Nose: Nose normal.  Mouth/Throat: Oropharynx is clear and moist. No oropharyngeal exudate.  Rhinorrhea  Eyes: Conjunctivae and EOM are normal. Pupils are equal, round, and reactive to light. Right eye exhibits no discharge. Left eye exhibits no discharge.  Neck: Normal range of motion. Neck supple.  Cardiovascular: Normal rate, regular rhythm, normal heart sounds and intact distal pulses.  Exam reveals no gallop and no friction rub.   No murmur heard. Pulmonary/Chest: Effort normal. No respiratory distress. She has wheezes. She has no rales. She exhibits no tenderness.  Mild expiratory and inspiratory wheezing throughout  Abdominal: Soft. She exhibits no distension. There is no tenderness.  Musculoskeletal: Normal range of motion. She exhibits no edema and no tenderness.  No leg edema or calf tenderness bilaterally  Neurological: She is alert and oriented to person, place, and time.  Skin: Skin is warm and dry. No rash noted. She is not diaphoretic.    ED Course  Procedures (including critical care time) Labs Review Labs Reviewed - No data to display Imaging Review No results found.  EKG Interpretation   None       MDM   1. Asthma exacerbation      Wendy Hoffman is a 47 y.o. female with a PMH of asthma, thyroid disease, HTN, HLD, and migraines who presents to the ED for evaluation of SOB.  Albuterol, Atrovent, and Prednisone ordered by nursing staff.     Rechecks  7:50 AM = expiratory wheezing improved.  Patient resting comfortably watching TV. No distress. Pulse ox 100% 8:31 AM = patient ambulating down the hallway.  States she feels great.  No chest pain or SOB.  No concerns.     Etiology of SOB is likely due to an asthma exacerbation.  Patient had wheezing on exam which cleared with 1 Duoneb and 1 albuterol treatment in the ED.  Patient's wheezing and symptoms were improved before my exam due to tx initiated by RN  staff at triage.  Patient was given a dose of prednisone in the ED and discharged with prednisone.  She states her symptoms today are similar to her asthma exacerbations in the past.  She had mild chest comfort and SOB which resolved with her Duoneb in the ED.  Wells Score 0.  PERC negative.  Patient afebrile, non-toxic in appearance, and remained in no acute distress.  Patient was given an inhaler to take home with her. Patient was instructed to return to the ED if they experience any chest pain, SOB, wheezing not relieved by her inhaler, fever, or other concerns.  Patient  instructed to follow-up with her PCP as soon as possible.  Patient was in agreement with discharge and plan.     Final impressions: 1. Asthma exacerbation     Luiz Iron PA-C   This patient was discussed with Dr. Nigel Mormon, PA-C 10/02/13 1930

## 2013-10-01 ENCOUNTER — Telehealth (HOSPITAL_COMMUNITY): Payer: Self-pay | Admitting: Emergency Medicine

## 2013-10-09 NOTE — ED Provider Notes (Signed)
Medical screening examination/treatment/procedure(s) were conducted as a shared visit with non-physician practitioner(s) and myself.  I personally evaluated the patient during the encounter.  EKG Interpretation   None       Pt w non prod cough, wheezing. Alb neb tx. Pt improved.   Suzi Roots, MD 10/09/13 1259

## 2013-10-14 ENCOUNTER — Emergency Department (HOSPITAL_COMMUNITY)
Admission: EM | Admit: 2013-10-14 | Discharge: 2013-10-14 | Disposition: A | Discharge status: Home / Self Care | Payer: Managed Care, Other (non HMO) | Attending: Emergency Medicine | Admitting: Emergency Medicine

## 2013-10-14 ENCOUNTER — Encounter (HOSPITAL_COMMUNITY): Payer: Self-pay | Admitting: Emergency Medicine

## 2013-10-14 DIAGNOSIS — F17200 Nicotine dependence, unspecified, uncomplicated: Secondary | ICD-10-CM | POA: Insufficient documentation

## 2013-10-14 DIAGNOSIS — G43909 Migraine, unspecified, not intractable, without status migrainosus: Secondary | ICD-10-CM | POA: Insufficient documentation

## 2013-10-14 DIAGNOSIS — Z8639 Personal history of other endocrine, nutritional and metabolic disease: Secondary | ICD-10-CM | POA: Insufficient documentation

## 2013-10-14 DIAGNOSIS — J45901 Unspecified asthma with (acute) exacerbation: Secondary | ICD-10-CM | POA: Insufficient documentation

## 2013-10-14 DIAGNOSIS — R112 Nausea with vomiting, unspecified: Secondary | ICD-10-CM | POA: Insufficient documentation

## 2013-10-14 DIAGNOSIS — Z880 Allergy status to penicillin: Secondary | ICD-10-CM | POA: Insufficient documentation

## 2013-10-14 DIAGNOSIS — Z862 Personal history of diseases of the blood and blood-forming organs and certain disorders involving the immune mechanism: Secondary | ICD-10-CM | POA: Insufficient documentation

## 2013-10-14 DIAGNOSIS — I10 Essential (primary) hypertension: Secondary | ICD-10-CM | POA: Insufficient documentation

## 2013-10-14 MED ORDER — METOCLOPRAMIDE HCL 5 MG/ML IJ SOLN
10.0000 mg | Freq: Once | INTRAMUSCULAR | Status: AC
  Administered 2013-10-14: 10 mg via INTRAMUSCULAR
  Filled 2013-10-14: qty 2

## 2013-10-14 MED ORDER — KETOROLAC TROMETHAMINE 60 MG/2ML IM SOLN
60.0000 mg | Freq: Once | INTRAMUSCULAR | Status: AC
  Administered 2013-10-14: 60 mg via INTRAMUSCULAR
  Filled 2013-10-14: qty 2

## 2013-10-14 MED ORDER — ALBUTEROL SULFATE (5 MG/ML) 0.5% IN NEBU
INHALATION_SOLUTION | RESPIRATORY_TRACT | Status: AC
  Administered 2013-10-14: 01:00:00
  Filled 2013-10-14: qty 1

## 2013-10-14 MED ORDER — DIPHENHYDRAMINE HCL 50 MG/ML IJ SOLN
25.0000 mg | Freq: Once | INTRAMUSCULAR | Status: AC
  Administered 2013-10-14: 25 mg via INTRAMUSCULAR
  Filled 2013-10-14: qty 1

## 2013-10-14 MED ORDER — IPRATROPIUM BROMIDE 0.02 % IN SOLN
RESPIRATORY_TRACT | Status: AC
  Administered 2013-10-14: 01:00:00
  Filled 2013-10-14: qty 2.5

## 2013-10-14 MED ORDER — DEXAMETHASONE SODIUM PHOSPHATE 10 MG/ML IJ SOLN
10.0000 mg | Freq: Once | INTRAMUSCULAR | Status: AC
  Administered 2013-10-14: 10 mg via INTRAMUSCULAR
  Filled 2013-10-14: qty 1

## 2013-10-14 NOTE — ED Notes (Signed)
Meds given during downtime. See meds given on written nursing notes.

## 2013-10-14 NOTE — ED Provider Notes (Signed)
Medical screening examination/treatment/procedure(s) were performed by non-physician practitioner and as supervising physician I was immediately available for consultation/collaboration.  EKG Interpretation   None         Dagmar Hait, MD 10/14/13 [phone]

## 2013-10-14 NOTE — ED Notes (Signed)
Pt arrived to the ED with a complaint of a migraine headache.  Pt states headache started this am.  Pt states she does have a hx of migraines.  Pt states she doesn't have medications at this time.

## 2013-10-14 NOTE — ED Provider Notes (Signed)
CSN: [id number]     Arrival date & time 10/14/13  0012 History   First MD Initiated Contact with Patient 10/14/13 0014     Chief Complaint  Patient presents with  . Migraine   (Consider location/radiation/quality/duration/timing/severity/associated sxs/prior Treatment) HPI Comments: Patient is a 47 year old female a history of migraine headaches who presents for a left frontal migraine with onset this morning. Patient states the headache feels similar to past migraine headaches. She has tried ibuprofen without relief. Times are associated with photophobia, phonophobia, intermittent blurry vision, nausea, and nonbloody, nonbilious emesis x2. Patient denies associated speech difficulty, dysphasia, fever, neck pain or stiffness, inability to ambulate, numbness and tingling, or extremity weakness. She denies being followed by neurologist. She further denies head trauma or injury inciting her symptoms.  Patient is a 47 y.o. female presenting with migraines. The history is provided by the patient. No language interpreter was used.  Migraine Associated symptoms include nausea and vomiting. Pertinent negatives include no chest pain, fever, neck pain, numbness or weakness.    Past Medical History  Diagnosis Date  . Asthma   . Thyroid disease   . Hypertension   . High cholesterol   . Migraine   . Morbid obesity   . H/O: hysterectomy    Past Surgical History  Procedure Laterality Date  . Cesarean section      3 occasions  . Thyroidectomy, partial      Goiter  . Cholecystectomy    . Abdominal hysterectomy     Family History  Problem Relation Age of Onset  . Migraines Sister    History  Substance Use Topics  . Smoking status: Current Every Day Smoker    Types: Cigarettes  . Smokeless tobacco: Not on file  . Alcohol Use: No   OB History   Grav Para Term Preterm Abortions TAB SAB Ect Mult Living                 Review of Systems  Constitutional: Negative for fever.  Eyes:  Positive for photophobia and visual disturbance.  Respiratory: Positive for shortness of breath (mild; history of asthma).   Cardiovascular: Negative for chest pain.  Gastrointestinal: Positive for nausea and vomiting.  Musculoskeletal: Negative for neck pain and neck stiffness.  Neurological: Negative for weakness and numbness.  All other systems reviewed and are negative.    Allergies  Mango flavor; Aspirin; Blueberry; and Penicillins  Home Medications   Current Outpatient Rx  Name  Route  Sig  Dispense  Refill  . ibuprofen (ADVIL,MOTRIN) 200 MG tablet   Oral   Take 800 mg by mouth every 6 (six) hours as needed for pain.          BP 156/101  Pulse 68  Temp(Src) 98 F (36.7 C) (Oral)  Resp 18  SpO2 96%  Physical Exam  Nursing note and vitals reviewed. Constitutional: She is oriented to person, place, and time. She appears well-developed and well-nourished. No distress.  HENT:  Head: Normocephalic and atraumatic.  Mouth/Throat: Oropharynx is clear and moist. No oropharyngeal exudate.  Eyes: Conjunctivae and EOM are normal. Pupils are equal, round, and reactive to light. No scleral icterus.  Neck: Normal range of motion. Neck supple.  Cardiovascular: Normal rate, regular rhythm and normal heart sounds.   Pulmonary/Chest: Effort normal. No respiratory distress. She has wheezes (Diffuse inspiratory and expiratory). She has no rales.  Abdominal: Soft. There is no tenderness. There is no rebound.  Musculoskeletal: Normal range of motion.  Neurological: She  is alert and oriented to person, place, and time. She has normal reflexes. No cranial nerve deficit.  GCS 15. Patient speaks in full goal oriented sentences. Cranial nerves III through XII grossly intact. Patient has equal grip strength bilaterally with 5 out of 5 strength against resistance in her upper and lower extremities. Patient moves extremities without ataxia and is ambulatory with normal gait. No gross sensory  deficits appreciated. DTRs normal and symmetric.  Skin: Skin is warm and dry. No rash noted. She is not diaphoretic. No erythema. No pallor.  Psychiatric: She has a normal mood and affect. Her behavior is normal.    ED Course  Procedures (including critical care time) Labs Review Labs Reviewed - No data to display Imaging Review No results found.  EKG Interpretation   None       MDM   1. Migraine   2. Asthma exacerbation, mild    47 year old female with a history of migraine headaches presents for a migraine headache with onset this morning. Patient without any focal neurologic deficits and denies head injury or trauma. GCS 15. Patient treated in ED with Decadron, Reglan, and Benadryl with mild relief. Toradol ordered IM for additional pain control. After 2.5 hours in the ED, patient endorses significant improvement in her migraine symptoms. She now states that she feels comfortable managing her symptoms at home. Mild shortness of breath secondary to asthma resolved with albuterol/atrovent neb. Patient hemodynamically stable without retractions, accessory muscle use, dyspnea, tachypnea, or hypoxia. Patient appropriate for discharge with primary care followup in 24 hours to ensure resolution of symptoms. Have advised at least 8 hours of sleep and rest in a dark and quiet room. Return precautions discussed and patient agreeable to plan with no unaddressed concerns.     Antony Madura, PA-C 10/14/13 [phone]

## 2013-10-18 ENCOUNTER — Encounter (HOSPITAL_COMMUNITY): Payer: Self-pay | Admitting: Emergency Medicine

## 2013-10-18 ENCOUNTER — Emergency Department (HOSPITAL_COMMUNITY): Payer: Self-pay

## 2013-10-18 ENCOUNTER — Emergency Department (HOSPITAL_COMMUNITY)
Admission: EM | Admit: 2013-10-18 | Discharge: 2013-10-18 | Disposition: A | Discharge status: Home / Self Care | Payer: Self-pay | Attending: Emergency Medicine | Admitting: Emergency Medicine

## 2013-10-18 DIAGNOSIS — M6281 Muscle weakness (generalized): Secondary | ICD-10-CM | POA: Insufficient documentation

## 2013-10-18 DIAGNOSIS — G43109 Migraine with aura, not intractable, without status migrainosus: Secondary | ICD-10-CM | POA: Insufficient documentation

## 2013-10-18 DIAGNOSIS — I10 Essential (primary) hypertension: Secondary | ICD-10-CM | POA: Insufficient documentation

## 2013-10-18 DIAGNOSIS — Z862 Personal history of diseases of the blood and blood-forming organs and certain disorders involving the immune mechanism: Secondary | ICD-10-CM | POA: Insufficient documentation

## 2013-10-18 DIAGNOSIS — Z880 Allergy status to penicillin: Secondary | ICD-10-CM | POA: Insufficient documentation

## 2013-10-18 DIAGNOSIS — J45909 Unspecified asthma, uncomplicated: Secondary | ICD-10-CM | POA: Insufficient documentation

## 2013-10-18 DIAGNOSIS — Z8639 Personal history of other endocrine, nutritional and metabolic disease: Secondary | ICD-10-CM | POA: Insufficient documentation

## 2013-10-18 DIAGNOSIS — F17200 Nicotine dependence, unspecified, uncomplicated: Secondary | ICD-10-CM | POA: Insufficient documentation

## 2013-10-18 LAB — PROTIME-INR
INR: 0.94 (ref 0.00–1.49)
Prothrombin Time: 12.4 seconds (ref 11.6–15.2)

## 2013-10-18 LAB — APTT: aPTT: 28 seconds (ref 24–37)

## 2013-10-18 LAB — DIFFERENTIAL
Basophils Relative: 1 % (ref 0–1)
Eosinophils Absolute: 0.4 10*3/uL (ref 0.0–0.7)
Eosinophils Relative: 4 % (ref 0–5)
Monocytes Relative: 6 % (ref 3–12)
Neutro Abs: 4.2 10*3/uL (ref 1.7–7.7)
Neutrophils Relative %: 48 % (ref 43–77)

## 2013-10-18 LAB — URINALYSIS, ROUTINE W REFLEX MICROSCOPIC
Glucose, UA: NEGATIVE mg/dL
Ketones, ur: NEGATIVE mg/dL
Nitrite: NEGATIVE
Protein, ur: NEGATIVE mg/dL
Urobilinogen, UA: 0.2 mg/dL (ref 0.0–1.0)

## 2013-10-18 LAB — RAPID URINE DRUG SCREEN, HOSP PERFORMED
Amphetamines: NOT DETECTED
Barbiturates: NOT DETECTED
Benzodiazepines: NOT DETECTED
Tetrahydrocannabinol: NOT DETECTED

## 2013-10-18 LAB — COMPREHENSIVE METABOLIC PANEL
ALT: 13 U/L (ref 0–35)
AST: 15 U/L (ref 0–37)
Albumin: 3.8 g/dL (ref 3.5–5.2)
Alkaline Phosphatase: 57 U/L (ref 39–117)
BUN: 17 mg/dL (ref 6–23)
CO2: 29 mEq/L (ref 19–32)
Chloride: 97 mEq/L (ref 96–112)
Potassium: 3.7 mEq/L (ref 3.5–5.1)
Sodium: 138 mEq/L (ref 135–145)
Total Bilirubin: 0.3 mg/dL (ref 0.3–1.2)
Total Protein: 7.3 g/dL (ref 6.0–8.3)

## 2013-10-18 LAB — URINE MICROSCOPIC-ADD ON

## 2013-10-18 LAB — CBC
HCT: 38.3 % (ref 36.0–46.0)
Hemoglobin: 13.3 g/dL (ref 12.0–15.0)
MCHC: 34.7 g/dL (ref 30.0–36.0)
MCV: 91.8 fL (ref 78.0–100.0)
Platelets: 320 10*3/uL (ref 150–400)
RBC: 4.17 MIL/uL (ref 3.87–5.11)

## 2013-10-18 LAB — POCT I-STAT TROPONIN I

## 2013-10-18 LAB — POCT I-STAT, CHEM 8
BUN: 26 mg/dL — ABNORMAL HIGH (ref 6–23)
Calcium, Ion: 1.06 mmol/L — ABNORMAL LOW (ref 1.12–1.23)
Chloride: 100 mEq/L (ref 96–112)
HCT: 40 % (ref 36.0–46.0)
Hemoglobin: 13.6 g/dL (ref 12.0–15.0)
Sodium: 134 mEq/L — ABNORMAL LOW (ref 135–145)
TCO2: 30 mmol/L (ref 0–100)

## 2013-10-18 LAB — ETHANOL: Alcohol, Ethyl (B): <11 mg/dL (ref 0–11)

## 2013-10-18 LAB — TROPONIN I: Troponin I: <0.3 ng/mL (ref <0.30)

## 2013-10-18 LAB — GLUCOSE, CAPILLARY: Glucose-Capillary: 97 mg/dL (ref 70–99)

## 2013-10-18 MED ORDER — IPRATROPIUM BROMIDE 0.02 % IN SOLN
0.5000 mg | Freq: Once | RESPIRATORY_TRACT | Status: AC
  Administered 2013-10-18: 0.5 mg via RESPIRATORY_TRACT
  Filled 2013-10-18: qty 2.5

## 2013-10-18 MED ORDER — HYDROCODONE-ACETAMINOPHEN 5-325 MG PO TABS
2.0000 | ORAL_TABLET | Freq: Once | ORAL | Status: AC
  Administered 2013-10-18: 2 via ORAL
  Filled 2013-10-18: qty 2

## 2013-10-18 MED ORDER — ALBUTEROL SULFATE HFA 108 (90 BASE) MCG/ACT IN AERS
2.0000 | INHALATION_SPRAY | Freq: Four times a day (QID) | RESPIRATORY_TRACT | Status: DC
  Administered 2013-10-18: 2 via RESPIRATORY_TRACT
  Filled 2013-10-18: qty 6.7

## 2013-10-18 MED ORDER — METOCLOPRAMIDE HCL 5 MG/ML IJ SOLN
10.0000 mg | Freq: Once | INTRAMUSCULAR | Status: AC
  Administered 2013-10-18: 10 mg via INTRAVENOUS
  Filled 2013-10-18: qty 2

## 2013-10-18 MED ORDER — IOHEXOL 350 MG/ML SOLN
100.0000 mL | Freq: Once | INTRAVENOUS | Status: AC | PRN
  Administered 2013-10-18: 100 mL via INTRAVENOUS

## 2013-10-18 MED ORDER — ALBUTEROL SULFATE (5 MG/ML) 0.5% IN NEBU
5.0000 mg | INHALATION_SOLUTION | Freq: Once | RESPIRATORY_TRACT | Status: AC
  Administered 2013-10-18: 5 mg via RESPIRATORY_TRACT
  Filled 2013-10-18: qty 1

## 2013-10-18 MED ORDER — PROMETHAZINE HCL 25 MG PO TABS: 25.0000 mg | ORAL_TABLET | Freq: Four times a day (QID) | ORAL | Status: DC | PRN

## 2013-10-18 NOTE — ED Notes (Addendum)
[facility] EDP given I-Stat results.

## 2013-10-18 NOTE — ED Provider Notes (Signed)
CSN: [id number]     Arrival date & time 10/18/13  0037 History   First MD Initiated Contact with Patient 10/18/13 0113     Chief Complaint  Patient presents with  . Dizziness   (Consider location/radiation/quality/duration/timing/severity/associated sxs/prior Treatment) HPI History provided by pt.   Pt had acute onset severe occipital headache w/ associated dizziness, described as room tilting, as well as blurred vision and right-sided facial numbness between 10:30 and 11pm last night.  Headache is not diffuse and she describes as pressure.  Vision has improved and dizziness occurs only with quick head movements.  Denies paresthesias and weakness of extremities.  H/o migraines but has never had these sx in the past.  Denies recent head trauma.  Per prior chart, pt has been seen in ED for migraines multiple times.  Had associated right-sided weakness in 2013 and was admitted for stroke evaluation.  Past Medical History  Diagnosis Date  . Asthma   . Thyroid disease   . Hypertension   . High cholesterol   . Migraine   . Morbid obesity   . H/O: hysterectomy    Past Surgical History  Procedure Laterality Date  . Cesarean section      3 occasions  . Thyroidectomy, partial      Goiter  . Cholecystectomy    . Abdominal hysterectomy     Family History  Problem Relation Age of Onset  . Migraines Sister    History  Substance Use Topics  . Smoking status: Current Every Day Smoker    Types: Cigarettes  . Smokeless tobacco: Not on file  . Alcohol Use: No   OB History   Grav Para Term Preterm Abortions TAB SAB Ect Mult Living                 Review of Systems  All other systems reviewed and are negative.    Allergies  Mango flavor; Aspirin; Blueberry; and Penicillins  Home Medications   Current Outpatient Rx  Name  Route  Sig  Dispense  Refill  . ibuprofen (ADVIL,MOTRIN) 200 MG tablet   Oral   Take 800 mg by mouth every 6 (six) hours as needed for pain.          BP  129/72  Pulse 93  Temp(Src) 98.2 F (36.8 C) (Oral)  Resp 17  Ht 5\' 4"  (1.626 m)  Wt 280 lb (127.007 kg)  BMI 48.04 kg/m2  SpO2 96% Physical Exam  Nursing note and vitals reviewed. Constitutional: She is oriented to person, place, and time. She appears well-developed and well-nourished. No distress.  HENT:  Head: Normocephalic and atraumatic.  Eyes:  Normal appearance  Neck: Normal range of motion.  Cardiovascular: Normal rate, regular rhythm and intact distal pulses.   Pulmonary/Chest: Effort normal.  Diffuse expiratory rhonchi  Musculoskeletal: Normal range of motion.  Neurological: She is alert and oriented to person, place, and time. No sensory deficit. Coordination normal.  CN 3-12 intact w/ exception of decreased sensation right side of face compared to L.  No nystagmus. Full active ROM all four extremities but drift of RLE and shaking of RUE that patient attributes to increased effort.  No past pointing.     Skin: Skin is warm and dry. No rash noted.  Psychiatric: She has a normal mood and affect. Her behavior is normal.    ED Course  Procedures (including critical care time) Labs Review Labs Reviewed  COMPREHENSIVE METABOLIC PANEL - Abnormal; Notable for the following:  Glucose, Bld 102 (*)    GFR calc non Af Amer 72 (*)    GFR calc Af Amer 84 (*)    All other components within normal limits  URINALYSIS, ROUTINE W REFLEX MICROSCOPIC - Abnormal; Notable for the following:    APPearance CLOUDY (*)    Leukocytes, UA MODERATE (*)    All other components within normal limits  URINE MICROSCOPIC-ADD ON - Abnormal; Notable for the following:    Squamous Epithelial / LPF MANY (*)    Bacteria, UA FEW (*)    All other components within normal limits  POCT I-STAT, CHEM 8 - Abnormal; Notable for the following:    Sodium 134 (*)    Potassium 6.8 (*)    BUN 26 (*)    Creatinine, Ser 1.20 (*)    Calcium, Ion 1.06 (*)    All other components within normal limits  URINE  CULTURE  ETHANOL  PROTIME-INR  APTT  CBC  DIFFERENTIAL  TROPONIN I  URINE RAPID DRUG SCREEN (HOSP PERFORMED)  POTASSIUM  GLUCOSE, CAPILLARY  POCT I-STAT TROPONIN I   Imaging Review Ct Angio Head W/cm &/or Wo Cm  10/18/2013   CLINICAL DATA:  Stabbing pain and posterior head, dizziness, blurry vision. Evaluate posterior circulation  EXAM: CT ANGIOGRAPHY HEAD  TECHNIQUE: Multidetector CT imaging of the head was performed using the standard protocol during bolus administration of intravenous contrast. Multiplanar CT image reconstructions including MIPs were obtained to evaluate the vascular anatomy.  CONTRAST:  OMNIPAQUE IOHEXOL 350 MG/ML SOLN  COMPARISON:  Prior noncontrast CT performed earlier on the same day and MRI/ MRA on 09/22/2012.  FINDINGS: The visualized portions of the petrous, cavernous, and supraclinoid segments of the internal carotid arteries are widely patent with antegrade flow. No high-grade stenosis identified. The A1 segments, anterior communicating artery, and anterior cerebral arteries are symmetric in size and caliber and normal in appearance. The middle cerebral arteries and their branches are well opacified bilaterally. No high-grade stenosis or aneurysm identified within the anterior circulation.  The visualized portions of the vertebral arteries are well opacified and of equal caliber. The vertebrobasilar junction is normal. The basilar artery is within normal limits. No basilar tip aneurysm. The posterior cerebral arteries and superior cerebellar arteries are symmetric in appearance without evidence of high-grade stenosis. The origins of the posterior inferior cerebellar arteries are unremarkable. The anterior inferior cerebellar arteries are grossly normal. Posterior communicating arteries are within normal limits. No intracranial aneurysm identified within the posterior circulation.  Review of the MIP images confirms the above findings  No definite acute intracranial  infarct identified on this examination. No mass lesion or midline shift. CSF containing spaces are normal. No extra-axial fluid collection. Calvarium is intact. Orbits are normal.  Minimal polypoid opacity is present within the right maxillary sinus. Paranasal sinuses and mastoid air cells are otherwise clear.  IMPRESSION: 1. Normal CTA of the head without evidence of high-grade stenosis or intracranial aneurysm. 2. No acute intracranial process identified.   Electronically Signed   By: Rise Mu M.D.   On: 10/18/2013 03:48   Ct Head Wo Contrast  10/18/2013   CLINICAL DATA:  Stabbing pain in the posterior aspect of the head. Dizziness and blurred vision. Numbness in the mouth.  EXAM: CT HEAD WITHOUT CONTRAST  TECHNIQUE: Contiguous axial images were obtained from the base of the skull through the vertex without intravenous contrast.  COMPARISON:  Head CT 09/21/2012.  FINDINGS: No acute intracranial abnormalities. Specifically, no evidence of  acute intracranial hemorrhage, no definite findings of acute/subacute cerebral ischemia, no mass, mass effect, hydrocephalus or abnormal intra or extra-axial fluid collections. Small focus of subtle decreased attenuation in the posterior right frontal lobe white matter (image 18 of series 2), which corresponds to a small area of T2 hyperintensity on prior MRI 09/22/2012. Visualized paranasal sinuses and mastoids are well pneumatized. No acute displaced skull fractures are identified.  IMPRESSION: * No acute intracranial abnormalities. *Small nonspecific white matter change in the posterior aspect of the right frontal lobe, favored to represent a focus of chronic microvascular ischemia. Alternatively, this can be seen in the setting of a demyelinating disease. This is unchanged.   Electronically Signed   By: Trudie Reed M.D.   On: 10/18/2013 02:02    EKG Interpretation   None       MDM   1. Complicated migraine   2. Asthma    47yo F w/ h/o HTN and  complicated migraines (admission for stroke evaluation  presents w/ acute onset headache, dizziness, blurred vision and R facial numbness 11pm last night.  NIH stroke scale 2 for decreased sensation right side of face and drift of RLE.  Nml coordination and no gait disturbance.  CT head non-acute and labs unremarkable.  Neuro consulted and recommended CTA and d/c home if negative.  CTA negative.  All results discussed w/ patient.  Her symptoms, including headache have improved.  This is likely a complicated migraine.  Will d/c home w/ referral to neuro and phenergan to be taken with benadryl to try for headache pain.  Pt has h/o asthma.  Expiratory rhonchi on exam.  She received a breathing treatment and breath sounds normalized.  She received a replacement albuterol inhaler and d/c'd home. Return precautions discussed.      Otilio Miu, PA-C 10/18/13 [phone]

## 2013-10-18 NOTE — ED Notes (Signed)
Pt reports a sharp stabbing pain to back of head. When positioning head straight pt reports tugging to the side of neck with dizziness and blurred vision and numbness to mouth.Pt also reports feeling sluggish. Pt had steady ambulation to room.

## 2013-10-18 NOTE — ED Provider Notes (Signed)
Medical screening examination/treatment/procedure(s) were conducted as a shared visit with non-physician practitioner(s) and myself.  I personally evaluated the patient during the encounter.  EKG Interpretation     Ventricular Rate:  81 PR Interval:  148 QRS Duration: 82 QT Interval:  400 QTC Calculation: 464 R Axis:   29 Text Interpretation:  Sinus rhythm Borderline T wave abnormalities            Pt c/o dull pain left posterior headache. No neck stiffness. Also had c/o dizziness. Ambulates w steady gait. No pronator drift. Motor 5/5 bil. Speech fluent. Hx migraines. Ct.   Suzi Roots, MD 10/18/13 [phone]

## 2013-10-19 LAB — URINE CULTURE

## 2013-11-03 ENCOUNTER — Encounter (HOSPITAL_COMMUNITY): Payer: Self-pay | Admitting: Emergency Medicine

## 2013-11-03 ENCOUNTER — Inpatient Hospital Stay (HOSPITAL_COMMUNITY)
Admission: EM | Admit: 2013-11-03 | Discharge: 2013-11-05 | DRG: 202 | Disposition: A | Discharge status: Home / Self Care | Payer: Managed Care, Other (non HMO) | Attending: Internal Medicine | Admitting: Internal Medicine

## 2013-11-03 ENCOUNTER — Emergency Department (HOSPITAL_COMMUNITY): Payer: Managed Care, Other (non HMO)

## 2013-11-03 DIAGNOSIS — J45901 Unspecified asthma with (acute) exacerbation: Principal | ICD-10-CM

## 2013-11-03 DIAGNOSIS — G43909 Migraine, unspecified, not intractable, without status migrainosus: Secondary | ICD-10-CM

## 2013-11-03 DIAGNOSIS — J00 Acute nasopharyngitis [common cold]: Secondary | ICD-10-CM | POA: Diagnosis present

## 2013-11-03 DIAGNOSIS — Z90710 Acquired absence of both cervix and uterus: Secondary | ICD-10-CM

## 2013-11-03 DIAGNOSIS — F17200 Nicotine dependence, unspecified, uncomplicated: Secondary | ICD-10-CM | POA: Diagnosis present

## 2013-11-03 DIAGNOSIS — R51 Headache: Secondary | ICD-10-CM

## 2013-11-03 DIAGNOSIS — Z720 Tobacco use: Secondary | ICD-10-CM

## 2013-11-03 DIAGNOSIS — R531 Weakness: Secondary | ICD-10-CM

## 2013-11-03 DIAGNOSIS — E079 Disorder of thyroid, unspecified: Secondary | ICD-10-CM

## 2013-11-03 DIAGNOSIS — M129 Arthropathy, unspecified: Secondary | ICD-10-CM | POA: Diagnosis present

## 2013-11-03 DIAGNOSIS — J45909 Unspecified asthma, uncomplicated: Secondary | ICD-10-CM

## 2013-11-03 DIAGNOSIS — E890 Postprocedural hypothyroidism: Secondary | ICD-10-CM | POA: Diagnosis present

## 2013-11-03 DIAGNOSIS — E7800 Pure hypercholesterolemia, unspecified: Secondary | ICD-10-CM | POA: Diagnosis present

## 2013-11-03 DIAGNOSIS — E039 Hypothyroidism, unspecified: Secondary | ICD-10-CM

## 2013-11-03 DIAGNOSIS — Z6841 Body Mass Index (BMI) 40.0 and over, adult: Secondary | ICD-10-CM

## 2013-11-03 DIAGNOSIS — Z8673 Personal history of transient ischemic attack (TIA), and cerebral infarction without residual deficits: Secondary | ICD-10-CM

## 2013-11-03 DIAGNOSIS — I10 Essential (primary) hypertension: Secondary | ICD-10-CM | POA: Diagnosis present

## 2013-11-03 HISTORY — DX: Other specified health status: Z78.9

## 2013-11-03 HISTORY — DX: Shortness of breath: R06.02

## 2013-11-03 HISTORY — DX: Hypothyroidism, unspecified: E03.9

## 2013-11-03 HISTORY — DX: Unspecified osteoarthritis, unspecified site: M19.90

## 2013-11-03 HISTORY — DX: Cerebral infarction, unspecified: I63.9

## 2013-11-03 HISTORY — DX: Malignant (primary) neoplasm, unspecified: C80.1

## 2013-11-03 HISTORY — DX: Anxiety disorder, unspecified: F41.9

## 2013-11-03 HISTORY — DX: Anemia, unspecified: D64.9

## 2013-11-03 LAB — CBC
HCT: 36.9 % (ref 36.0–46.0)
Hemoglobin: 12.7 g/dL (ref 12.0–15.0)
MCH: 32.1 pg (ref 26.0–34.0)
MCHC: 34.4 g/dL (ref 30.0–36.0)
RBC: 3.96 MIL/uL (ref 3.87–5.11)

## 2013-11-03 LAB — BASIC METABOLIC PANEL
BUN: 12 mg/dL (ref 6–23)
CO2: 26 mEq/L (ref 19–32)
Chloride: 104 mEq/L (ref 96–112)
Creatinine, Ser: 0.94 mg/dL (ref 0.50–1.10)
GFR calc Af Amer: 84 mL/min — ABNORMAL LOW (ref >90)
Glucose, Bld: 92 mg/dL (ref 70–99)
Potassium: 4.2 mEq/L (ref 3.5–5.1)
Sodium: 139 mEq/L (ref 135–145)

## 2013-11-03 MED ORDER — SENNOSIDES-DOCUSATE SODIUM 8.6-50 MG PO TABS: 1.0000 | ORAL_TABLET | Freq: Every evening | ORAL | Status: DC | PRN

## 2013-11-03 MED ORDER — SODIUM CHLORIDE 0.9 % IJ SOLN
3.0000 mL | Freq: Two times a day (BID) | INTRAMUSCULAR | Status: DC
  Administered 2013-11-03 – 2013-11-04 (×3): 3 mL via INTRAVENOUS

## 2013-11-03 MED ORDER — OXYCODONE HCL 5 MG PO TABS
5.0000 mg | ORAL_TABLET | ORAL | Status: DC | PRN
  Administered 2013-11-03 – 2013-11-05 (×6): 5 mg via ORAL
  Filled 2013-11-03 (×6): qty 1

## 2013-11-03 MED ORDER — ALBUTEROL SULFATE (5 MG/ML) 0.5% IN NEBU
5.0000 mg | INHALATION_SOLUTION | Freq: Once | RESPIRATORY_TRACT | Status: AC
  Administered 2013-11-03: 5 mg via RESPIRATORY_TRACT
  Filled 2013-11-03: qty 1

## 2013-11-03 MED ORDER — ENOXAPARIN SODIUM 60 MG/0.6ML ~~LOC~~ SOLN
60.0000 mg | SUBCUTANEOUS | Status: DC
  Administered 2013-11-03 – 2013-11-04 (×2): 60 mg via SUBCUTANEOUS
  Filled 2013-11-03 (×3): qty 0.6

## 2013-11-03 MED ORDER — ALBUTEROL (5 MG/ML) CONTINUOUS INHALATION SOLN
15.0000 mg/h | INHALATION_SOLUTION | Freq: Once | RESPIRATORY_TRACT | Status: AC
  Administered 2013-11-03: 15 mg/h via RESPIRATORY_TRACT
  Filled 2013-11-03: qty 20

## 2013-11-03 MED ORDER — ACETAMINOPHEN 650 MG RE SUPP: 650.0000 mg | Freq: Four times a day (QID) | RECTAL | Status: DC | PRN

## 2013-11-03 MED ORDER — PNEUMOCOCCAL VAC POLYVALENT 25 MCG/0.5ML IJ INJ
0.5000 mL | INJECTION | INTRAMUSCULAR | Status: AC
  Administered 2013-11-04: 0.5 mL via INTRAMUSCULAR
  Filled 2013-11-03 (×2): qty 0.5

## 2013-11-03 MED ORDER — SODIUM CHLORIDE 0.9 % IV SOLN: 250.0000 mL | INTRAVENOUS | Status: DC | PRN

## 2013-11-03 MED ORDER — ONDANSETRON HCL 4 MG/2ML IJ SOLN: 4.0000 mg | Freq: Four times a day (QID) | INTRAMUSCULAR | Status: DC | PRN

## 2013-11-03 MED ORDER — ONDANSETRON HCL 4 MG PO TABS: 4.0000 mg | ORAL_TABLET | Freq: Four times a day (QID) | ORAL | Status: DC | PRN

## 2013-11-03 MED ORDER — ACETAMINOPHEN 325 MG PO TABS: 650.0000 mg | ORAL_TABLET | Freq: Four times a day (QID) | ORAL | Status: DC | PRN

## 2013-11-03 MED ORDER — METHYLPREDNISOLONE SODIUM SUCC 125 MG IJ SOLR
80.0000 mg | Freq: Three times a day (TID) | INTRAMUSCULAR | Status: DC
  Administered 2013-11-03 – 2013-11-05 (×5): 80 mg via INTRAVENOUS
  Filled 2013-11-03 (×8): qty 1.28

## 2013-11-03 MED ORDER — SODIUM CHLORIDE 0.9 % IJ SOLN: 3.0000 mL | INTRAMUSCULAR | Status: DC | PRN

## 2013-11-03 MED ORDER — ALBUTEROL (5 MG/ML) CONTINUOUS INHALATION SOLN
20.0000 mg/h | INHALATION_SOLUTION | Freq: Once | RESPIRATORY_TRACT | Status: AC
  Administered 2013-11-03: 20 mg/h via RESPIRATORY_TRACT

## 2013-11-03 MED ORDER — MAGNESIUM SULFATE 50 % IJ SOLN: 1.0000 g | Freq: Once | INTRAMUSCULAR | Status: DC

## 2013-11-03 MED ORDER — IPRATROPIUM BROMIDE 0.02 % IN SOLN
0.5000 mg | Freq: Once | RESPIRATORY_TRACT | Status: AC
  Administered 2013-11-03: 0.5 mg via RESPIRATORY_TRACT
  Filled 2013-11-03: qty 2.5

## 2013-11-03 MED ORDER — ALBUTEROL SULFATE (5 MG/ML) 0.5% IN NEBU
2.5000 mg | INHALATION_SOLUTION | Freq: Four times a day (QID) | RESPIRATORY_TRACT | Status: DC
  Administered 2013-11-03 – 2013-11-05 (×8): 2.5 mg via RESPIRATORY_TRACT
  Filled 2013-11-03 (×8): qty 0.5

## 2013-11-03 MED ORDER — PREDNISONE 20 MG PO TABS
60.0000 mg | ORAL_TABLET | Freq: Once | ORAL | Status: AC
  Administered 2013-11-03: 60 mg via ORAL
  Filled 2013-11-03: qty 3

## 2013-11-03 MED ORDER — MAGNESIUM SULFATE IN D5W 10-5 MG/ML-% IV SOLN
1.0000 g | Freq: Once | INTRAVENOUS | Status: AC
  Administered 2013-11-03: 1 g via INTRAVENOUS
  Filled 2013-11-03: qty 100

## 2013-11-03 MED ORDER — ALBUTEROL SULFATE (5 MG/ML) 0.5% IN NEBU: 2.5000 mg | INHALATION_SOLUTION | RESPIRATORY_TRACT | Status: DC | PRN

## 2013-11-03 MED ORDER — FLUTICASONE PROPIONATE HFA 220 MCG/ACT IN AERO
2.0000 | INHALATION_SPRAY | Freq: Two times a day (BID) | RESPIRATORY_TRACT | Status: DC
  Administered 2013-11-03 – 2013-11-05 (×4): 2 via RESPIRATORY_TRACT
  Filled 2013-11-03: qty 12

## 2013-11-03 NOTE — ED Notes (Signed)
Pt comes from home with c/o asthma exacerbation since last night.  Pt is audibly wheezing and her breathing is labored.  Wheezing is more pronounced in right lobes, rhonchus in left.  Pt has non-productive cough.  Pt used albuterol inhaler at home PTA with no relief.  Pt denies fever and N/V.

## 2013-11-03 NOTE — ED Notes (Signed)
Patient transported to X-ray 

## 2013-11-03 NOTE — H&P (Signed)
[facility]          History and Physical    PCP:   Jeri Cos, MD   Chief Complaint:  Shortness of breath  HPI: Patient is a pleasant 47 year old obese African American woman who has a past medical history significant for asthma, hypertension, hypothyroidism, and continued tobacco abuse. Her PCP, Dr. Bascom Levels, retired and hence she has not followed up with a doctor or taken any medication since. She presents today with a three-day history of increased shortness of breath and nonproductive cough. To the point where she has been using her inhaler 1-2 times every hour. She has also had what she feels is a head cold with a stuffy nose and admits to being around some sick children. She was given two hour long nebs in the ED without resolution of symptoms and hence we are asked to admit her for further evaluation and management.   Allergies:   Allergies  Allergen Reactions  . Mango Flavor Shortness Of Breath  . Aspirin Nausea And Vomiting  . Blueberry [Vaccinium Angustifolium] Rash  . Penicillins Rash      Past Medical History  Diagnosis Date  . Asthma   . Thyroid disease   . Hypertension   . High cholesterol   . Migraine   . Morbid obesity   . H/O: hysterectomy   . Medical history non-contributory   . Hypothyroidism   . Anxiety   . Shortness of breath   . Stroke   . Cancer   . Arthritis   . Anemia     Past Surgical History  Procedure Laterality Date  . Cesarean section      3 occasions  . Thyroidectomy, partial      Goiter  . Cholecystectomy    . Abdominal hysterectomy      Prior to Admission medications   Medication Sig Start Date End Date Taking? Authorizing Provider  albuterol (PROVENTIL HFA;VENTOLIN HFA) 108 (90 BASE) MCG/ACT inhaler Inhale 1 puff into the lungs every 6 (six) hours as needed for wheezing or shortness of breath.   Yes Historical Provider, MD  budesonide-formoterol (SYMBICORT) 160-4.5 MCG/ACT inhaler Inhale 2 puffs into  the lungs 2 (two) times daily.   Yes Historical Provider, MD    Social History:  reports that she has been smoking Cigarettes.  She has been smoking about 0.00 packs per day. She does not have any smokeless tobacco history on file. She reports that she does not drink alcohol or use illicit drugs.  Family History  Problem Relation Age of Onset  . Migraines Sister     Review of Systems:  Constitutional: Denies fever, chills, diaphoresis, appetite change and fatigue.  HEENT: Denies photophobia, eye pain, redness, hearing loss, ear pain, congestion, sore throat, rhinorrhea, sneezing, mouth sores, trouble swallowing, neck pain, neck stiffness and tinnitus.   Respiratory: Denies chest tightness. Cardiovascular: Denies chest pain, palpitations and leg swelling.  Gastrointestinal: Denies nausea, vomiting, abdominal pain, diarrhea, constipation, blood in stool and abdominal distention.  Genitourinary: Denies dysuria, urgency, frequency, hematuria, flank pain and difficulty urinating.  Endocrine: Denies: hot or cold intolerance, sweats, changes in hair or nails, polyuria, polydipsia. Musculoskeletal: Denies myalgias, back pain, joint swelling, arthralgias and gait problem.  Skin: Denies pallor, rash and wound.  Neurological: Denies dizziness, seizures, syncope, weakness, light-headedness, numbness and headaches.  Hematological: Denies adenopathy. Easy bruising, personal or family bleeding history  Psychiatric/Behavioral: Denies suicidal ideation, mood changes, confusion, nervousness, sleep disturbance and agitation   Physical Exam: Blood pressure  139/89, pulse 110, temperature 98.1 F (36.7 C), temperature source Oral, resp. rate 20, height 5\' 4"  (1.626 m), weight 130.228 kg (287 lb 1.6 oz), SpO2 98.00%.  general: Alert, awake, oriented x3. There pleasant and cooperative with exam. HEENT: Normocephalic, atraumatic, pupils equal and reactive to light, extraocular movements intact, moist mucous  membranes. Neck: Supple, no JVD, no lymphadenopathy, no bruits, no goiter. Cardiovascular: Regular rate and rhythm, no murmurs, rubs or gallops. Lungs: Diffuse bilateral expiratory wheezes. Abdomen: Obese, soft, nontender, nondistended, positive bowel sounds. Extremities: No clubbing, cyanosis or edema, positive pedal pulses. Neurologic: Grossly intact and nonfocal.   Labs on Admission:  Results for orders placed during the hospital encounter of 11/03/13 (from the past 48 hour(s))  BASIC METABOLIC PANEL     Status: Abnormal   Collection Time    11/03/13 12:55 PM      Result Value Range   Sodium 139  135 - 145 mEq/L   Potassium 4.2  3.5 - 5.1 mEq/L   Chloride 104  96 - 112 mEq/L   CO2 26  19 - 32 mEq/L   Glucose, Bld 92  70 - 99 mg/dL   BUN 12  6 - 23 mg/dL   Creatinine, Ser 1.61  0.50 - 1.10 mg/dL   Calcium 9.1  8.4 - 09.6 mg/dL   GFR calc non Af Amer 72 (*) >90 mL/min   GFR calc Af Amer 84 (*) >90 mL/min   Comment: (NOTE)     The eGFR has been calculated using the CKD EPI equation.     This calculation has not been validated in all clinical situations.     eGFR's persistently <90 mL/min signify possible Chronic Kidney     Disease.  CBC     Status: None   Collection Time    11/03/13 12:55 PM      Result Value Range   WBC 4.2  4.0 - 10.5 K/uL   RBC 3.96  3.87 - 5.11 MIL/uL   Hemoglobin 12.7  12.0 - 15.0 g/dL   HCT 04.5  40.9 - 81.1 %   MCV 93.2  78.0 - 100.0 fL   MCH 32.1  26.0 - 34.0 pg   MCHC 34.4  30.0 - 36.0 g/dL   RDW 91.4  78.2 - 95.6 %   Platelets 273  150 - 400 K/uL    Radiological Exams on Admission: Dg Chest 2 View (if Patient Has Fever And/or Copd)  11/03/2013   CLINICAL DATA:  Shortness of breath and asthma.  EXAM: CHEST - 2 VIEW  COMPARISON:  06/26/2013  FINDINGS: The heart size and mediastinal contours are within normal limits. Lung volumes are normal. T here is no evidence of pulmonary edema, consolidation, pneumothorax, nodule or pleural fluid. The  visualized skeletal structures are unremarkable.  IMPRESSION: No active disease.   Electronically Signed   By: Irish Lack M.D.   On: 11/03/2013 14:15    Assessment/Plan Principal Problem:   Asthma with acute exacerbation Active Problems:   Hypertension   Unspecified hypothyroidism   Tobacco abuse   Asthma with acute exacerbation -Will admit to a non-telemetry bed and will start on IV steroids, scheduled and as needed nebulizer treatments. -Will also start her on an inhaled steroid such as Flovent as she states that even on a good week she uses her albuterol inhaler 3-4 times a week.  Hypothyroidism -Check TSH. -She has not taken Synthroid in many months.  Tobacco abuse -Counseled on cessation.  Hypertension -Blood pressure  currently appears to be controlled. -Monitor trend and decide whether she needs to be placed on antihypertensives.  DVT prophylaxis -Lovenox.  CODE STATUS -Full code  Disposition  -will need to discuss with case management medication assistance and PCP followup as she currently does not have medical insurance.   Time Spent on Admission:  70 minutes   HERNANDEZ ACOSTA,ESTELA [facility] Pager: [phone] 11/03/2013, 5:12 PM

## 2013-11-03 NOTE — ED Notes (Signed)
Respiratory Tech notified.  

## 2013-11-03 NOTE — ED Provider Notes (Signed)
CSN: [id number]     Arrival date & time 11/03/13  1227 History   First MD Initiated Contact with Patient 11/03/13 1236     Chief Complaint  Patient presents with  . Shortness of Breath  . Asthma    exacerbation   (Consider location/radiation/quality/duration/timing/severity/associated sxs/prior Treatment) HPI  This is a 47 year old female who presents with acute asthma exacerbation. Patient reports a one-week history of increasing wheezing and shortness of breath. She's been using albuterol without much relief. She denies any fevers or coughs. Patient is not on any other medications for her asthma. She does have a history of being intubated for her asthma. She denies any chest pain.  Past Medical History  Diagnosis Date  . Asthma   . Thyroid disease   . Hypertension   . High cholesterol   . Migraine   . Morbid obesity   . H/O: hysterectomy    Past Surgical History  Procedure Laterality Date  . Cesarean section      3 occasions  . Thyroidectomy, partial      Goiter  . Cholecystectomy    . Abdominal hysterectomy     Family History  Problem Relation Age of Onset  . Migraines Sister    History  Substance Use Topics  . Smoking status: Current Every Day Smoker    Types: Cigarettes  . Smokeless tobacco: Not on file  . Alcohol Use: No   OB History   Grav Para Term Preterm Abortions TAB SAB Ect Mult Living                 Review of Systems  Constitutional: Negative for fever.  Respiratory: Positive for shortness of breath and wheezing. Negative for cough and chest tightness.   Cardiovascular: Negative for chest pain.  Gastrointestinal: Negative for nausea, vomiting and abdominal pain.  Genitourinary: Negative for dysuria.  Psychiatric/Behavioral: Negative for confusion.  All other systems reviewed and are negative.    Allergies  Mango flavor; Aspirin; Blueberry; and Penicillins  Home Medications   Current Outpatient Rx  Name  Route  Sig  Dispense  Refill  .  albuterol (PROVENTIL HFA;VENTOLIN HFA) 108 (90 BASE) MCG/ACT inhaler   Inhalation   Inhale 1 puff into the lungs every 6 (six) hours as needed for wheezing or shortness of breath.         . budesonide-formoterol (SYMBICORT) 160-4.5 MCG/ACT inhaler   Inhalation   Inhale 2 puffs into the lungs 2 (two) times daily.          BP 127/99  Pulse 97  Temp(Src) 97.6 F (36.4 C) (Oral)  Resp 36  SpO2 96% Physical Exam  Nursing note and vitals reviewed. Constitutional: She is oriented to person, place, and time.  Obese, speaking in short sentences  HENT:  Head: Normocephalic and atraumatic.  Mouth/Throat: Oropharynx is clear and moist.  Eyes: Pupils are equal, round, and reactive to light.  Cardiovascular: Normal rate and normal heart sounds.   No murmur heard. Tachycardia  Pulmonary/Chest: She is in respiratory distress. She has wheezes.  Increased work of breathing, diffuse wheezing with fair air movement, mild respiratory distress  Abdominal: Soft. Bowel sounds are normal. There is no tenderness.  Neurological: She is alert and oriented to person, place, and time.  Skin: Skin is warm and dry.  Psychiatric: She has a normal mood and affect.    ED Course  Procedures (including critical care time) Labs Review Labs Reviewed  BASIC METABOLIC PANEL - Abnormal; Notable for the  following:    GFR calc non Af Amer 72 (*)    GFR calc Af Amer 84 (*)    All other components within normal limits  CBC   Imaging Review Dg Chest 2 View (if Patient Has Fever And/or Copd)  11/03/2013   CLINICAL DATA:  Shortness of breath and asthma.  EXAM: CHEST - 2 VIEW  COMPARISON:  06/26/2013  FINDINGS: The heart size and mediastinal contours are within normal limits. Lung volumes are normal. T here is no evidence of pulmonary edema, consolidation, pneumothorax, nodule or pleural fluid. The visualized skeletal structures are unremarkable.  IMPRESSION: No active disease.   Electronically Signed   By: Irish Lack M.D.   On: 11/03/2013 14:15    EKG Interpretation    Date/Time:  Saturday November 03 2013 13:11:28 EST Ventricular Rate:  95 PR Interval:  139 QRS Duration: 81 QT Interval:  364 QTC Calculation: 458 R Axis:   8 Text Interpretation:  Sinus rhythm Abnormal R-wave progression, early transition Borderline repolarization abnormality Baseline wander in lead(s) III aVF Confirmed by Deetya Drouillard  MD, Worth Kober ([id number]) on 11/03/2013 3:57:04 PM            MDM   1. Asthma    This 47 year old female with a history of asthma who presents with shortness of breath. She has labored breathing and diffuse wheezing. Patient was placed on continuous DuoNeb and given prednisone. After one hour, patient continued to have wheezing on exam. Patient was given magnesium and placed on a no other continuous nebulization.  Chest x-ray is negative and lab work is reassuring.  Patient continues to wheeze after her second neb. Patient will need to be admitted for further management.    Shon Baton, MD 11/03/13 [phone]

## 2013-11-03 NOTE — ED Notes (Signed)
Respiratory notified of continuous Neb ordered.

## 2013-11-04 DIAGNOSIS — R51 Headache: Secondary | ICD-10-CM

## 2013-11-04 LAB — BASIC METABOLIC PANEL
CO2: 22 mEq/L (ref 19–32)
Calcium: 9.7 mg/dL (ref 8.4–10.5)
Chloride: 100 mEq/L (ref 96–112)
Creatinine, Ser: 0.62 mg/dL (ref 0.50–1.10)
GFR calc non Af Amer: >90 mL/min (ref >90)
Glucose, Bld: 200 mg/dL — ABNORMAL HIGH (ref 70–99)
Sodium: 136 mEq/L (ref 135–145)

## 2013-11-04 LAB — CBC
HCT: 34.4 % — ABNORMAL LOW (ref 36.0–46.0)
MCH: 31.6 pg (ref 26.0–34.0)
MCHC: 34.3 g/dL (ref 30.0–36.0)
Platelets: 264 10*3/uL (ref 150–400)
RBC: 3.74 MIL/uL — ABNORMAL LOW (ref 3.87–5.11)
RDW: 12.9 % (ref 11.5–15.5)

## 2013-11-04 MED ORDER — ZOLPIDEM TARTRATE 5 MG PO TABS
5.0000 mg | ORAL_TABLET | Freq: Every evening | ORAL | Status: DC | PRN
  Administered 2013-11-04: 5 mg via ORAL
  Filled 2013-11-04: qty 1

## 2013-11-04 MED ORDER — KETOROLAC TROMETHAMINE 15 MG/ML IJ SOLN
15.0000 mg | Freq: Three times a day (TID) | INTRAMUSCULAR | Status: DC | PRN
  Administered 2013-11-05: 15 mg via INTRAVENOUS
  Filled 2013-11-04: qty 1

## 2013-11-04 MED ORDER — MORPHINE SULFATE 2 MG/ML IJ SOLN
1.0000 mg | INTRAMUSCULAR | Status: DC | PRN
  Administered 2013-11-04 (×2): 1 mg via INTRAVENOUS
  Filled 2013-11-04 (×2): qty 1

## 2013-11-04 NOTE — Progress Notes (Signed)
[facility] PROGRESS NOTE  VIVIANN BROYLES WUJ:[id number] DOB: February 18, 1966 DOA: 11/03/2013 PCP: Jeri Cos, MD  Assessment/Plan: Asthma with acute exacerbation -Wheezes are still present but improved. -Continue steroids at current dose without titrating, frequent nebs, Flovent.  Hypothyroidism -TSH within normal limits.  Hypertension -Well controlled.  Tobacco abuse -Counseled on cessation.  Code Status: Full code Family Communication: Patient only  Disposition Plan: Home in medically stable   Consultants:  None   Antibiotics:  None   Subjective: This wheezing, feels better, less shortness of breath.  Objective: Filed Vitals:   11/04/13 0145 11/04/13 0548 11/04/13 0837 11/04/13 1402  BP:  126/80  159/86  Pulse:  86  63  Temp:  97.7 F (36.5 C)  98.2 F (36.8 C)  TempSrc:  Oral  Oral  Resp:    20  Height:      Weight:      SpO2: 98% 98% 96% 96%   No intake or output data in the 24 hours ending 11/04/13 1450 Filed Weights   11/03/13 1651  Weight: 130.228 kg (287 lb 1.6 oz)    Exam:   General:  Alert, awake, oriented x3  Cardiovascular: Regular rate and rhythm  Respiratory: Bilateral expiratory wheezes  Abdomen: Soft, nontender, nondistended, positive bowel sounds  Extremities: No clubbing, cyanosis or edema, positive pedal pulses   Neurologic:  Intact and nonfocal  Data Reviewed: Basic Metabolic Panel:  Recent Labs Lab 11/03/13 1255 11/04/13 0542  NA 139 136  K 4.2 4.0  CL 104 100  CO2 26 22  GLUCOSE 92 200*  BUN 12 11  CREATININE 0.94 0.62  CALCIUM 9.1 9.7   Liver Function Tests: No results found for this basename: AST, ALT, ALKPHOS, BILITOT, PROT, ALBUMIN,  in the last 168 hours No results found for this basename: LIPASE, AMYLASE,  in the last 168 hours No results found for this basename: AMMONIA,  in the last 168 hours CBC:  Recent Labs Lab 11/03/13 1255 11/04/13 0542  WBC 4.2 5.2  HGB 12.7 11.8*  HCT 36.9  34.4*  MCV 93.2 92.0  PLT 273 264   Cardiac Enzymes: No results found for this basename: CKTOTAL, CKMB, CKMBINDEX, TROPONINI,  in the last 168 hours BNP (last 3 results) No results found for this basename: PROBNP,  in the last 8760 hours CBG: No results found for this basename: GLUCAP,  in the last 168 hours  No results found for this or any previous visit (from the past 240 hour(s)).   Studies: Dg Chest 2 View (if Patient Has Fever And/or Copd)  11/03/2013   CLINICAL DATA:  Shortness of breath and asthma.  EXAM: CHEST - 2 VIEW  COMPARISON:  06/26/2013  FINDINGS: The heart size and mediastinal contours are within normal limits. Lung volumes are normal. T here is no evidence of pulmonary edema, consolidation, pneumothorax, nodule or pleural fluid. The visualized skeletal structures are unremarkable.  IMPRESSION: No active disease.   Electronically Signed   By: Irish Lack M.D.   On: 11/03/2013 14:15    Scheduled Meds: . albuterol  2.5 mg Nebulization Q6H  . enoxaparin (LOVENOX) injection  60 mg Subcutaneous Q24H  . fluticasone  2 puff Inhalation BID  . methylPREDNISolone (SOLU-MEDROL) injection  80 mg Intravenous Q8H  . sodium chloride  3 mL Intravenous Q12H   Continuous Infusions:   Principal Problem:   Asthma with acute exacerbation Active Problems:   Hypertension   Unspecified hypothyroidism   Tobacco abuse    Time  spent: 35 minutes    HERNANDEZ ACOSTA,ESTELA  [facility] Pager [phone]  If 7PM-7AM, please contact night-coverage at [email or web address], password [email or web address] 11/04/2013, 2:50 PM  LOS: 1 day

## 2013-11-05 LAB — BASIC METABOLIC PANEL
BUN: 15 mg/dL (ref 6–23)
CO2: 25 mEq/L (ref 19–32)
Chloride: 101 mEq/L (ref 96–112)
Creatinine, Ser: 0.84 mg/dL (ref 0.50–1.10)
GFR calc Af Amer: >90 mL/min (ref >90)
Potassium: 4.9 mEq/L (ref 3.5–5.1)

## 2013-11-05 LAB — CBC
HCT: 35 % — ABNORMAL LOW (ref 36.0–46.0)
Hemoglobin: 11.8 g/dL — ABNORMAL LOW (ref 12.0–15.0)
MCV: 92.3 fL (ref 78.0–100.0)
Platelets: 288 10*3/uL (ref 150–400)
WBC: 9.9 10*3/uL (ref 4.0–10.5)

## 2013-11-05 MED ORDER — PREDNISONE (PAK) 10 MG PO TABS: ORAL_TABLET | Freq: Every day | ORAL | Status: DC

## 2013-11-05 MED ORDER — ZOLPIDEM TARTRATE 5 MG PO TABS: 5.0000 mg | ORAL_TABLET | Freq: Every evening | ORAL | Status: DC | PRN

## 2013-11-05 MED ORDER — ALBUTEROL SULFATE HFA 108 (90 BASE) MCG/ACT IN AERS
2.0000 | INHALATION_SPRAY | RESPIRATORY_TRACT | Status: DC | PRN
  Filled 2013-11-05: qty 6.7

## 2013-11-05 MED ORDER — ALBUTEROL SULFATE HFA 108 (90 BASE) MCG/ACT IN AERS: 1.0000 | INHALATION_SPRAY | Freq: Four times a day (QID) | RESPIRATORY_TRACT | Status: DC | PRN

## 2013-11-05 MED ORDER — FLUTICASONE PROPIONATE HFA 220 MCG/ACT IN AERO: 2.0000 | INHALATION_SPRAY | Freq: Two times a day (BID) | RESPIRATORY_TRACT | Status: DC

## 2013-11-05 MED ORDER — OXYCODONE HCL 5 MG PO TABS: 5.0000 mg | ORAL_TABLET | ORAL | Status: DC | PRN

## 2013-11-05 NOTE — Progress Notes (Signed)
Met with pt at bedside to discuss follow up care. She does not have health insurance at this time. I gave information on the [facility] anf asked her to call today to make an appt for establishment of care and post hospitalization.   Algernon Huxley, RN BSN  [phone]

## 2013-11-05 NOTE — Discharge Summary (Signed)
Physician Discharge Summary  Wendy Hoffman ZOX:[id number] DOB: Mar 17, 1966 DOA: 11/03/2013  PCP: Jeri Cos, MD  Admit date: 11/03/2013 Discharge date: 11/05/2013  Time spent: 45 minutes  Recommendations for Outpatient Follow-up:  -Will be discharged home today. -Advised to secure follow up at the [facility] (CM has provided contact information) and also advised on importance of medication compliance.   Discharge Diagnoses:  Principal Problem:   Asthma with acute exacerbation Active Problems:   Hypertension   Unspecified hypothyroidism   Tobacco abuse   Discharge Condition: Stable and improved  Filed Weights   11/03/13 1651  Weight: 130.228 kg (287 lb 1.6 oz)    History of present illness:  Patient is a pleasant 47 year old obese African American woman who has a past medical history significant for asthma, hypertension, hypothyroidism, and continued tobacco abuse. Her PCP, Dr. Bascom Levels, retired and hence she has not followed up with a doctor or taken any medication since. She presents today with a three-day history of increased shortness of breath and nonproductive cough. To the point where she has been using her inhaler 1-2 times every hour. She has also had what she feels is a head cold with a stuffy nose and admits to being around some sick children. She was given two hour long nebs in the ED without resolution of symptoms and hence we were asked to admit her for further evaluation and management.    Hospital Course:   Asthma with acute exacerbation  -much improved today. -Patient wants to go home today. -Will DC on flovent as well as PRN albuterol, also a prednisone taper.   Hypothyroidism  -TSH within normal limits.   Hypertension  -Well controlled.   Tobacco abuse  -Counseled on cessation.   Procedures:  none   Consultations:  None  Discharge Instructions  Discharge Orders   Future Orders Complete By Expires   Discontinue IV  As  directed    Increase activity slowly  As directed        Medication List    STOP taking these medications       budesonide-formoterol 160-4.5 MCG/ACT inhaler  Commonly known as:  SYMBICORT      TAKE these medications       albuterol 108 (90 BASE) MCG/ACT inhaler  Commonly known as:  PROVENTIL HFA;VENTOLIN HFA  Inhale 1 puff into the lungs every 6 (six) hours as needed for wheezing or shortness of breath.     fluticasone 220 MCG/ACT inhaler  Commonly known as:  FLOVENT HFA  Inhale 2 puffs into the lungs 2 (two) times daily.     oxyCODONE 5 MG immediate release tablet  Commonly known as:  Oxy IR/ROXICODONE  Take 1 tablet (5 mg total) by mouth every 4 (four) hours as needed for moderate pain.     predniSONE 10 MG tablet  Commonly known as:  STERAPRED UNI-PAK  Take by mouth daily. Take as directed     zolpidem 5 MG tablet  Commonly known as:  AMBIEN  Take 1 tablet (5 mg total) by mouth at bedtime as needed for sleep (insomnia).       Allergies  Allergen Reactions  . Mango Flavor Shortness Of Breath  . Aspirin Nausea And Vomiting  . Blueberry [Vaccinium Angustifolium] Rash  . Penicillins Rash       Follow-up Information   Follow up with Jeri Cos, MD. Schedule an appointment as soon as possible for a visit in 2 weeks.   Specialty:  Internal Medicine  Contact information:   [address] [place] [address] [phone]        The results of significant diagnostics from this hospitalization (including imaging, microbiology, ancillary and laboratory) are listed below for reference.    Significant Diagnostic Studies: Ct Angio Head W/cm &/or Wo Cm  10/18/2013   CLINICAL DATA:  Stabbing pain and posterior head, dizziness, blurry vision. Evaluate posterior circulation  EXAM: CT ANGIOGRAPHY HEAD  TECHNIQUE: Multidetector CT imaging of the head was performed using the standard protocol during bolus administration of intravenous  contrast. Multiplanar CT image reconstructions including MIPs were obtained to evaluate the vascular anatomy.  CONTRAST:  OMNIPAQUE IOHEXOL 350 MG/ML SOLN  COMPARISON:  Prior noncontrast CT performed earlier on the same day and MRI/ MRA on 09/22/2012.  FINDINGS: The visualized portions of the petrous, cavernous, and supraclinoid segments of the internal carotid arteries are widely patent with antegrade flow. No high-grade stenosis identified. The A1 segments, anterior communicating artery, and anterior cerebral arteries are symmetric in size and caliber and normal in appearance. The middle cerebral arteries and their branches are well opacified bilaterally. No high-grade stenosis or aneurysm identified within the anterior circulation.  The visualized portions of the vertebral arteries are well opacified and of equal caliber. The vertebrobasilar junction is normal. The basilar artery is within normal limits. No basilar tip aneurysm. The posterior cerebral arteries and superior cerebellar arteries are symmetric in appearance without evidence of high-grade stenosis. The origins of the posterior inferior cerebellar arteries are unremarkable. The anterior inferior cerebellar arteries are grossly normal. Posterior communicating arteries are within normal limits. No intracranial aneurysm identified within the posterior circulation.  Review of the MIP images confirms the above findings  No definite acute intracranial infarct identified on this examination. No mass lesion or midline shift. CSF containing spaces are normal. No extra-axial fluid collection. Calvarium is intact. Orbits are normal.  Minimal polypoid opacity is present within the right maxillary sinus. Paranasal sinuses and mastoid air cells are otherwise clear.  IMPRESSION: 1. Normal CTA of the head without evidence of high-grade stenosis or intracranial aneurysm. 2. No acute intracranial process identified.   Electronically Signed   By: Rise Mu M.D.   On: 10/18/2013 03:48   Dg Chest 2 View (if Patient Has Fever And/or Copd)  11/03/2013   CLINICAL DATA:  Shortness of breath and asthma.  EXAM: CHEST - 2 VIEW  COMPARISON:  06/26/2013  FINDINGS: The heart size and mediastinal contours are within normal limits. Lung volumes are normal. T here is no evidence of pulmonary edema, consolidation, pneumothorax, nodule or pleural fluid. The visualized skeletal structures are unremarkable.  IMPRESSION: No active disease.   Electronically Signed   By: Irish Lack M.D.   On: 11/03/2013 14:15   Ct Head Wo Contrast  10/18/2013   CLINICAL DATA:  Stabbing pain in the posterior aspect of the head. Dizziness and blurred vision. Numbness in the mouth.  EXAM: CT HEAD WITHOUT CONTRAST  TECHNIQUE: Contiguous axial images were obtained from the base of the skull through the vertex without intravenous contrast.  COMPARISON:  Head CT 09/21/2012.  FINDINGS: No acute intracranial abnormalities. Specifically, no evidence of acute intracranial hemorrhage, no definite findings of acute/subacute cerebral ischemia, no mass, mass effect, hydrocephalus or abnormal intra or extra-axial fluid collections. Small focus of subtle decreased attenuation in the posterior right frontal lobe white matter (image 18 of series 2), which corresponds to a small area of T2 hyperintensity  on prior MRI 09/22/2012. Visualized paranasal sinuses and mastoids are well pneumatized. No acute displaced skull fractures are identified.  IMPRESSION: * No acute intracranial abnormalities. *Small nonspecific white matter change in the posterior aspect of the right frontal lobe, favored to represent a focus of chronic microvascular ischemia. Alternatively, this can be seen in the setting of a demyelinating disease. This is unchanged.   Electronically Signed   By: Trudie Reed M.D.   On: 10/18/2013 02:02    Microbiology: No results found for this or any previous visit (from the past 240  hour(s)).   Labs: Basic Metabolic Panel:  Recent Labs Lab 11/03/13 1255 11/04/13 0542 11/05/13 0518  NA 139 136 136  K 4.2 4.0 4.9  CL 104 100 101  CO2 26 22 25   GLUCOSE 92 200* 150*  BUN 12 11 15   CREATININE 0.94 0.62 0.84  CALCIUM 9.1 9.7 9.7   Liver Function Tests: No results found for this basename: AST, ALT, ALKPHOS, BILITOT, PROT, ALBUMIN,  in the last 168 hours No results found for this basename: LIPASE, AMYLASE,  in the last 168 hours No results found for this basename: AMMONIA,  in the last 168 hours CBC:  Recent Labs Lab 11/03/13 1255 11/04/13 0542 11/05/13 0518  WBC 4.2 5.2 9.9  HGB 12.7 11.8* 11.8*  HCT 36.9 34.4* 35.0*  MCV 93.2 92.0 92.3  PLT 273 264 288   Cardiac Enzymes: No results found for this basename: CKTOTAL, CKMB, CKMBINDEX, TROPONINI,  in the last 168 hours BNP: BNP (last 3 results) No results found for this basename: PROBNP,  in the last 8760 hours CBG: No results found for this basename: GLUCAP,  in the last 168 hours     Signed:  Chaya Jan  [facility] Pager: [phone] 11/05/2013, 2:01 PM

## 2013-11-16 ENCOUNTER — Ambulatory Visit: Payer: Self-pay | Attending: Internal Medicine | Admitting: Internal Medicine

## 2013-11-16 VITALS — BP 142/82 | HR 72 | Temp 98.7°F | Ht 64.5 in | Wt 286.0 lb

## 2013-11-16 DIAGNOSIS — Z7689 Persons encountering health services in other specified circumstances: Secondary | ICD-10-CM

## 2013-11-16 DIAGNOSIS — Z7189 Other specified counseling: Secondary | ICD-10-CM

## 2013-11-16 DIAGNOSIS — I10 Essential (primary) hypertension: Secondary | ICD-10-CM | POA: Insufficient documentation

## 2013-11-16 LAB — COMPLETE METABOLIC PANEL WITH GFR
ALT: 9 U/L (ref 0–35)
Albumin: 3.8 g/dL (ref 3.5–5.2)
CO2: 30 mEq/L (ref 19–32)
Calcium: 9.6 mg/dL (ref 8.4–10.5)
Chloride: 103 mEq/L (ref 96–112)
GFR, Est African American: >89 mL/min
Potassium: 4.2 mEq/L (ref 3.5–5.3)
Sodium: 139 mEq/L (ref 135–145)
Total Protein: 6.5 g/dL (ref 6.0–8.3)

## 2013-11-16 LAB — CBC WITH DIFFERENTIAL/PLATELET
Basophils Relative: 0 % (ref 0–1)
Eosinophils Absolute: 0.3 10*3/uL (ref 0.0–0.7)
MCH: 31.5 pg (ref 26.0–34.0)
MCHC: 34.7 g/dL (ref 30.0–36.0)
Monocytes Relative: 7 % (ref 3–12)
Neutrophils Relative %: 60 % (ref 43–77)
Platelets: 287 10*3/uL (ref 150–400)
RDW: 13.9 % (ref 11.5–15.5)

## 2013-11-16 MED ORDER — PREDNISONE 20 MG PO TABS: 40.0000 mg | ORAL_TABLET | Freq: Every day | ORAL | Status: DC

## 2013-11-16 MED ORDER — HYDROCHLOROTHIAZIDE 25 MG PO TABS: 25.0000 mg | ORAL_TABLET | Freq: Every day | ORAL | Status: DC

## 2013-11-16 MED ORDER — NICOTINE 21 MG/24HR TD PT24: 21.0000 mg | MEDICATED_PATCH | Freq: Every day | TRANSDERMAL | Status: DC

## 2013-11-16 NOTE — Progress Notes (Signed)
Patient ID: Wendy Hoffman, female   DOB: 26-Mar-1966, 47 y.o.   MRN: [id number]   CC:  HPI: 47 year old obese African American woman who has a past medical history significant for asthma, hypertension, hypothyroidism, and continued tobacco abuse. Her PCP, Dr. Bascom Levels, retired and hence she has not followed up with a doctor or taken any medication since. She states that she was recently discharged from [facility] exacerbation. She continues to use her Flovent twice a day, albuterol inhaler 4-5 times a day She feels that her exacerbation is still continuing and she is requesting another round of prednisone however she continues to smoke. She received a flu vaccination in the hospital She was also on antihypertensive medication hydrochlorothiazide but she has not taken for a year She has gained about 20 pounds in the last one year She also has a history of hypothyroidism but does not currently take any medication for this She also states that she has been anemic in the past  She is a strong family history of asthma, hypertension No family history of diabetes  Her last mammogram was 2 years ago  She has had a partial hysterectomy, and does not feel the need for a Pap smear   Allergies  Allergen Reactions  . Aspirin   . Penicillins    No past medical history on file. No current outpatient prescriptions on file prior to visit.   No current facility-administered medications on file prior to visit.   No family history on file. History   Social History  . Marital Status: Single    Spouse Name: N/A    Number of Children: N/A  . Years of Education: N/A   Occupational History  . Not on file.   Social History Main Topics  . Smoking status: Not on file  . Smokeless tobacco: Not on file  . Alcohol Use: Not on file  . Drug Use: Not on file  . Sexual Activity: Not on file   Other Topics Concern  . Not on file   Social History Narrative  . No narrative on file     Review of Systems  Constitutional: Negative for fever, chills, diaphoresis, activity change, appetite change and fatigue.  HENT: Negative for ear pain, nosebleeds, congestion, facial swelling, rhinorrhea, neck pain, neck stiffness and ear discharge.   Eyes: Negative for pain, discharge, redness, itching and visual disturbance.  Respiratory: Negative for cough, choking, chest tightness, shortness of breath, wheezing and stridor.   Cardiovascular: Negative for chest pain, palpitations and leg swelling.  Gastrointestinal: Negative for abdominal distention.  Genitourinary: Negative for dysuria, urgency, frequency, hematuria, flank pain, decreased urine volume, difficulty urinating and dyspareunia.  Musculoskeletal: Negative for back pain, joint swelling, arthralgias and gait problem.  Neurological: Negative for dizziness, tremors, seizures, syncope, facial asymmetry, speech difficulty, weakness, light-headedness, numbness and headaches.  Hematological: Negative for adenopathy. Does not bruise/bleed easily.  Psychiatric/Behavioral: Negative for hallucinations, behavioral problems, confusion, dysphoric mood, decreased concentration and agitation.    Objective:   Filed Vitals:   11/16/13 1428  BP: 142/82  Pulse: 72  Temp: 98.7 F (37.1 C)    Physical Exam  Constitutional: Appears well-developed and well-nourished. No distress.  HENT: Normocephalic. External right and left ear normal. Oropharynx is clear and moist.  Eyes: Conjunctivae and EOM are normal. PERRLA, no scleral icterus.  Neck: Normal ROM. Neck supple. No JVD. No tracheal deviation. No thyromegaly.  CVS: RRR, S1/S2 +, no murmurs, no gallops, no carotid bruit.  Pulmonary: Effort and  breath sounds normal, no stridor, rhonchi, wheezes, rales.  Abdominal: Soft. BS +,  no distension, tenderness, rebound or guarding.  Musculoskeletal: Normal range of motion. No edema and no tenderness.  Lymphadenopathy: No lymphadenopathy noted,  cervical, inguinal. Neuro: Alert. Normal reflexes, muscle tone coordination. No cranial nerve deficit. Skin: Skin is warm and dry. No rash noted. Not diaphoretic. No erythema. No pallor.  Psychiatric: Normal mood and affect. Behavior, judgment, thought content normal.   No results found for this basename: WBC, HGB, HCT, MCV, PLT   No results found for this basename: CREATININE, BUN, NA, K, CL, CO2    No results found for this basename: HGBA1C   Lipid Panel  No results found for this basename: chol, trig, hdl, cholhdl, vldl, ldlcalc       Assessment and plan:   There are no active problems to display for this patient.       Hypertension Will prescribe HCTZ 25 mg a day Will check her kidney function  History of anemia Will check a hemoglobin, anemia panel  History of asthma with exacerbation The patient will continue with the Flovent and the albuterol strongly emphasized smoking cessation We'll give another 5 day course of prednisone 40 mg a day Nicotine patch has been provided  We'll schedule the patient for a mammogram  The patient will follow up in 2 weeks for her asthma exacerbation as well as her hypertension, and to review her blood work   Of note that the patient has 2 social security numbers and  2 different names, but these records need to be Wendy Hoffman, Wendy Hoffman Female, 47 y.o., 16-Jan-1966  MRN: [id number]   The patient was given clear instructions to go to ER or return to medical center if symptoms don't improve, worsen or new problems develop. The patient verbalized understanding. The patient was told to call to get any lab results if not heard anything in the next week.

## 2013-11-16 NOTE — Progress Notes (Signed)
Pt is here to establish care; hospital f/u. Trouble with breathing, tightness in chest and wheezing; asthmatic. Constant pressure headache in the back of the head. Requests to be put back on medication for hypertension, migraines, and thyroid; previously on medication. Bruise and soreness on stomach from hospital injection.

## 2013-11-17 LAB — ANEMIA PANEL
%SAT: 15 % — ABNORMAL LOW (ref 20–55)
Iron: 50 ug/dL (ref 42–145)
RBC.: 3.91 MIL/uL (ref 3.87–5.11)
Vitamin B-12: 416 pg/mL (ref 211–911)

## 2013-11-17 LAB — TSH: TSH: 2.776 u[IU]/mL (ref 0.350–4.500)

## 2013-11-28 ENCOUNTER — Ambulatory Visit: Payer: Self-pay

## 2013-12-06 ENCOUNTER — Emergency Department (HOSPITAL_COMMUNITY): Payer: Self-pay

## 2013-12-06 ENCOUNTER — Encounter (HOSPITAL_COMMUNITY): Payer: Self-pay | Admitting: Emergency Medicine

## 2013-12-06 ENCOUNTER — Other Ambulatory Visit: Payer: Self-pay

## 2013-12-06 ENCOUNTER — Emergency Department (HOSPITAL_COMMUNITY)
Admission: EM | Admit: 2013-12-06 | Discharge: 2013-12-06 | Disposition: A | Discharge status: Home / Self Care | Payer: Self-pay | Attending: Emergency Medicine | Admitting: Emergency Medicine

## 2013-12-06 DIAGNOSIS — J45901 Unspecified asthma with (acute) exacerbation: Secondary | ICD-10-CM | POA: Insufficient documentation

## 2013-12-06 DIAGNOSIS — I10 Essential (primary) hypertension: Secondary | ICD-10-CM | POA: Insufficient documentation

## 2013-12-06 DIAGNOSIS — F17200 Nicotine dependence, unspecified, uncomplicated: Secondary | ICD-10-CM | POA: Insufficient documentation

## 2013-12-06 DIAGNOSIS — Z8739 Personal history of other diseases of the musculoskeletal system and connective tissue: Secondary | ICD-10-CM | POA: Insufficient documentation

## 2013-12-06 DIAGNOSIS — Z8673 Personal history of transient ischemic attack (TIA), and cerebral infarction without residual deficits: Secondary | ICD-10-CM | POA: Insufficient documentation

## 2013-12-06 DIAGNOSIS — Z862 Personal history of diseases of the blood and blood-forming organs and certain disorders involving the immune mechanism: Secondary | ICD-10-CM | POA: Insufficient documentation

## 2013-12-06 DIAGNOSIS — Z859 Personal history of malignant neoplasm, unspecified: Secondary | ICD-10-CM | POA: Insufficient documentation

## 2013-12-06 DIAGNOSIS — Z880 Allergy status to penicillin: Secondary | ICD-10-CM | POA: Insufficient documentation

## 2013-12-06 DIAGNOSIS — J45909 Unspecified asthma, uncomplicated: Secondary | ICD-10-CM

## 2013-12-06 DIAGNOSIS — Z79899 Other long term (current) drug therapy: Secondary | ICD-10-CM | POA: Insufficient documentation

## 2013-12-06 DIAGNOSIS — IMO0002 Reserved for concepts with insufficient information to code with codable children: Secondary | ICD-10-CM | POA: Insufficient documentation

## 2013-12-06 DIAGNOSIS — Z8659 Personal history of other mental and behavioral disorders: Secondary | ICD-10-CM | POA: Insufficient documentation

## 2013-12-06 LAB — CBC WITH DIFFERENTIAL/PLATELET
Basophils Relative: 0 % (ref 0–1)
Eosinophils Absolute: 0.2 10*3/uL (ref 0.0–0.7)
Eosinophils Relative: 6 % — ABNORMAL HIGH (ref 0–5)
Hemoglobin: 13.6 g/dL (ref 12.0–15.0)
Lymphocytes Relative: 52 % — ABNORMAL HIGH (ref 12–46)
Lymphs Abs: 1.9 10*3/uL (ref 0.7–4.0)
MCH: 31.9 pg (ref 26.0–34.0)
MCHC: 34.3 g/dL (ref 30.0–36.0)
MCV: 93 fL (ref 78.0–100.0)
Monocytes Absolute: 0.3 10*3/uL (ref 0.1–1.0)
Monocytes Relative: 8 % (ref 3–12)
Neutrophils Relative %: 34 % — ABNORMAL LOW (ref 43–77)
RBC: 4.27 MIL/uL (ref 3.87–5.11)

## 2013-12-06 LAB — POCT I-STAT, CHEM 8
BUN: 8 mg/dL (ref 6–23)
Calcium, Ion: 1.25 mmol/L — ABNORMAL HIGH (ref 1.12–1.23)
Creatinine, Ser: 0.9 mg/dL (ref 0.50–1.10)
Glucose, Bld: 108 mg/dL — ABNORMAL HIGH (ref 70–99)
Hemoglobin: 14.3 g/dL (ref 12.0–15.0)
Potassium: 3.2 mEq/L — ABNORMAL LOW (ref 3.5–5.1)
TCO2: 27 mmol/L (ref 0–100)

## 2013-12-06 MED ORDER — ALBUTEROL SULFATE (5 MG/ML) 0.5% IN NEBU
5.0000 mg | INHALATION_SOLUTION | Freq: Once | RESPIRATORY_TRACT | Status: AC
  Administered 2013-12-06: 5 mg via RESPIRATORY_TRACT
  Filled 2013-12-06: qty 1

## 2013-12-06 MED ORDER — IPRATROPIUM BROMIDE 0.02 % IN SOLN
0.5000 mg | Freq: Once | RESPIRATORY_TRACT | Status: AC
  Administered 2013-12-06: 0.5 mg via RESPIRATORY_TRACT
  Filled 2013-12-06: qty 2.5

## 2013-12-06 MED ORDER — ALBUTEROL SULFATE HFA 108 (90 BASE) MCG/ACT IN AERS
2.0000 | INHALATION_SPRAY | Freq: Once | RESPIRATORY_TRACT | Status: AC
  Administered 2013-12-06: 2 via RESPIRATORY_TRACT
  Filled 2013-12-06: qty 6.7

## 2013-12-06 MED ORDER — PREDNISONE 20 MG PO TABS: 60.0000 mg | ORAL_TABLET | Freq: Once | ORAL | Status: DC

## 2013-12-06 MED ORDER — ALBUTEROL (5 MG/ML) CONTINUOUS INHALATION SOLN
5.0000 mg/h | INHALATION_SOLUTION | Freq: Once | RESPIRATORY_TRACT | Status: AC
  Administered 2013-12-06: 5 mg/h via RESPIRATORY_TRACT
  Filled 2013-12-06: qty 20

## 2013-12-06 MED ORDER — ALBUTEROL SULFATE HFA 108 (90 BASE) MCG/ACT IN AERS: 2.0000 | INHALATION_SPRAY | RESPIRATORY_TRACT | Status: DC | PRN

## 2013-12-06 MED ORDER — MAGNESIUM SULFATE IN D5W 10-5 MG/ML-% IV SOLN
1.0000 g | Freq: Once | INTRAVENOUS | Status: AC
  Administered 2013-12-06: 1 g via INTRAVENOUS
  Filled 2013-12-06: qty 100

## 2013-12-06 MED ORDER — MORPHINE SULFATE 4 MG/ML IJ SOLN
4.0000 mg | Freq: Once | INTRAMUSCULAR | Status: AC
  Administered 2013-12-06: 4 mg via INTRAVENOUS
  Filled 2013-12-06: qty 1

## 2013-12-06 MED ORDER — PREDNISONE 10 MG PO TABS: 60.0000 mg | ORAL_TABLET | Freq: Every day | ORAL | Status: AC

## 2013-12-06 MED ORDER — METHYLPREDNISOLONE SODIUM SUCC 125 MG IJ SOLR
125.0000 mg | Freq: Once | INTRAMUSCULAR | Status: AC
  Administered 2013-12-06: 125 mg via INTRAVENOUS
  Filled 2013-12-06: qty 2

## 2013-12-06 NOTE — ED Provider Notes (Signed)
CSN: [id number]     Arrival date & time 12/06/13  1416 History   First MD Initiated Contact with Patient 12/06/13 1451     Chief Complaint  Patient presents with  . Shortness of Breath   (Consider location/radiation/quality/duration/timing/severity/associated sxs/prior Treatment) Patient is a 47 y.o. female presenting with wheezing. The history is provided by the patient. No language interpreter was used.  Wheezing Severity:  Moderate Severity compared to prior episodes:  Similar Onset quality:  Gradual Duration:  1 day Associated symptoms: cough and shortness of breath   Associated symptoms: no chest pain, no chest tightness, no fever, no headaches, no rash, no sore throat and no stridor   Cough:    Cough characteristics:  Non-productive   Severity:  Moderate   Onset quality:  Gradual   Timing:  Intermittent Shortness of breath:    Onset quality:  Gradual Risk factors: prior hospitalizations    Pt is a 47 year old female who presents with a 3 week history of cough and worsening asthma. She reports that she has been admitted before fore her asthma.Her last admission was 11/03/2013. She reports a non-productive cough with worsening of her asthma and shortness of breath in the last 24 hours. She reports that she has been using her albuterol every 2-4 hours and that she ran out of her albuterol inhaler at approx 0300 this morning. She denies fever, chills, nausea, vomiting or diarrhea. No known sick exposure or recent travel. She is able to speak in full sentences. No history of DVT or PE.    Past Medical History  Diagnosis Date  . Asthma   . Thyroid disease   . Hypertension   . High cholesterol   . Migraine   . Morbid obesity   . H/O: hysterectomy   . Medical history non-contributory   . Hypothyroidism   . Anxiety   . Shortness of breath   . Stroke   . Cancer   . Arthritis   . Anemia    Past Surgical History  Procedure Laterality Date  . Cesarean section      3  occasions  . Thyroidectomy, partial      Goiter  . Cholecystectomy    . Abdominal hysterectomy     Family History  Problem Relation Age of Onset  . Migraines Sister    History  Substance Use Topics  . Smoking status: Current Every Day Smoker    Types: Cigarettes  . Smokeless tobacco: Not on file  . Alcohol Use: No   OB History   Grav Para Term Preterm Abortions TAB SAB Ect Mult Living                 Review of Systems  Constitutional: Negative for fever and chills.  HENT: Negative for sore throat, trouble swallowing and voice change.   Respiratory: Positive for cough, shortness of breath and wheezing. Negative for chest tightness and stridor.   Cardiovascular: Negative for chest pain and leg swelling.  Gastrointestinal: Negative for nausea, vomiting, abdominal pain and diarrhea.  Skin: Negative for rash.  Neurological: Negative for headaches.  All other systems reviewed and are negative.    Allergies  Mango flavor; Aspirin; Penicillins; Blueberry; and Penicillins  Home Medications   Current Outpatient Rx  Name  Route  Sig  Dispense  Refill  . albuterol (PROVENTIL HFA;VENTOLIN HFA) 108 (90 BASE) MCG/ACT inhaler   Inhalation   Inhale 1 puff into the lungs every 6 (six) hours as needed for wheezing  or shortness of breath.   1 Inhaler   12   . fluticasone (FLOVENT HFA) 220 MCG/ACT inhaler   Inhalation   Inhale 2 puffs into the lungs 2 (two) times daily.   1 Inhaler   12   . hydrochlorothiazide (HYDRODIURIL) 25 MG tablet   Oral   Take 1 tablet (25 mg total) by mouth daily.   90 tablet   3   . nicotine (NICODERM CQ - DOSED IN MG/24 HOURS) 21 mg/24hr patch   Transdermal   Place 1 patch (21 mg total) onto the skin daily.   28 patch   0   . oxyCODONE (OXY IR/ROXICODONE) 5 MG immediate release tablet   Oral   Take 1 tablet (5 mg total) by mouth every 4 (four) hours as needed for moderate pain.   20 tablet   0   . predniSONE (DELTASONE) 20 MG tablet    Oral   Take 2 tablets (40 mg total) by mouth daily with breakfast.   20 tablet   0   . zolpidem (AMBIEN) 5 MG tablet   Oral   Take 1 tablet (5 mg total) by mouth at bedtime as needed for sleep (insomnia).   15 tablet   0    BP 172/105  Pulse 98  Temp(Src) 97.6 F (36.4 C) (Oral)  Resp 22  SpO2 100% Physical Exam  Nursing note and vitals reviewed. Constitutional: She is oriented to person, place, and time. She appears well-developed and well-nourished. No distress.  HENT:  Head: Normocephalic and atraumatic.  Mouth/Throat: Oropharynx is clear and moist.  Eyes: Conjunctivae and EOM are normal.  Neck: Normal range of motion. Neck supple. No JVD present. No tracheal deviation present. No thyromegaly present.  Cardiovascular: Normal rate, regular rhythm, normal heart sounds and intact distal pulses.   Pulmonary/Chest: Effort normal. She has wheezes in the right upper field, the right middle field, the left upper field and the left middle field.  Abdominal: Soft. Bowel sounds are normal. She exhibits no distension. There is no tenderness.  Musculoskeletal: Normal range of motion. She exhibits no edema and no tenderness.  Lymphadenopathy:    She has no cervical adenopathy.  Neurological: She is alert and oriented to person, place, and time.  Skin: Skin is warm and dry.  Psychiatric: She has a normal mood and affect. Her behavior is normal. Judgment and thought content normal.    ED Course  Procedures (including critical care time) Labs Review Labs Reviewed - No data to display Imaging Review No results found.  EKG Interpretation   None     7:11 PM Clearing breath sounds after treatments x 3. Mag sulfate and IV solumedrol. Ambulatory SPO2 on room air 97%. No tachypnea, tachycardia or shortness of breath. Reports that she is feeling better and wants to go home.   MDM   1. Asthma     Afebrile. Well-appearing. No leukocytosis. Chest x-ray, no acute findings. Feeling  better after treatment here. Nebs x 3, Mag sulfate IV and solumedrol IV. No tachypnea, tachycardia or signs of hypoxia. Pt wants to go home and understands strict return precautions. Pt understands plan and agrees. Prescription for prednisone and albuterol inhaler to go. Agrees to return if any worsening of symptoms. Follow-up with PCP in 1-2 days.    Irish Elders, NP 12/06/13 1929

## 2013-12-06 NOTE — ED Notes (Signed)
Patient is resting comfortably.  Rt notified of breathing treatment

## 2013-12-06 NOTE — ED Notes (Signed)
Patient reports that she has been feeling SOB since yesterday. The patient has known history of asthma. Patient reports that she is having to do breating treatments at home q 2 hours

## 2013-12-07 NOTE — ED Provider Notes (Signed)
Medical screening examination/treatment/procedure(s) were performed by non-physician practitioner and as supervising physician I was immediately available for consultation/collaboration.  EKG Interpretation   None        Katlen Seyer F Julianna Vanwagner, MD 12/07/13 1511 

## 2013-12-19 ENCOUNTER — Emergency Department (HOSPITAL_COMMUNITY)
Admission: EM | Admit: 2013-12-19 | Discharge: 2013-12-19 | Disposition: A | Discharge status: Home / Self Care | Payer: Self-pay | Attending: Emergency Medicine | Admitting: Emergency Medicine

## 2013-12-19 ENCOUNTER — Encounter (HOSPITAL_COMMUNITY): Payer: Self-pay | Admitting: Emergency Medicine

## 2013-12-19 ENCOUNTER — Emergency Department (HOSPITAL_COMMUNITY): Payer: Self-pay

## 2013-12-19 DIAGNOSIS — J45901 Unspecified asthma with (acute) exacerbation: Secondary | ICD-10-CM | POA: Insufficient documentation

## 2013-12-19 DIAGNOSIS — F17200 Nicotine dependence, unspecified, uncomplicated: Secondary | ICD-10-CM | POA: Insufficient documentation

## 2013-12-19 DIAGNOSIS — Z8739 Personal history of other diseases of the musculoskeletal system and connective tissue: Secondary | ICD-10-CM | POA: Insufficient documentation

## 2013-12-19 DIAGNOSIS — Z859 Personal history of malignant neoplasm, unspecified: Secondary | ICD-10-CM | POA: Insufficient documentation

## 2013-12-19 DIAGNOSIS — I10 Essential (primary) hypertension: Secondary | ICD-10-CM | POA: Insufficient documentation

## 2013-12-19 DIAGNOSIS — Z862 Personal history of diseases of the blood and blood-forming organs and certain disorders involving the immune mechanism: Secondary | ICD-10-CM | POA: Insufficient documentation

## 2013-12-19 DIAGNOSIS — Z79899 Other long term (current) drug therapy: Secondary | ICD-10-CM | POA: Insufficient documentation

## 2013-12-19 DIAGNOSIS — Z90710 Acquired absence of both cervix and uterus: Secondary | ICD-10-CM | POA: Insufficient documentation

## 2013-12-19 DIAGNOSIS — IMO0002 Reserved for concepts with insufficient information to code with codable children: Secondary | ICD-10-CM | POA: Insufficient documentation

## 2013-12-19 DIAGNOSIS — Z8673 Personal history of transient ischemic attack (TIA), and cerebral infarction without residual deficits: Secondary | ICD-10-CM | POA: Insufficient documentation

## 2013-12-19 DIAGNOSIS — Z880 Allergy status to penicillin: Secondary | ICD-10-CM | POA: Insufficient documentation

## 2013-12-19 DIAGNOSIS — Z8659 Personal history of other mental and behavioral disorders: Secondary | ICD-10-CM | POA: Insufficient documentation

## 2013-12-19 DIAGNOSIS — Z8639 Personal history of other endocrine, nutritional and metabolic disease: Secondary | ICD-10-CM | POA: Insufficient documentation

## 2013-12-19 LAB — COMPREHENSIVE METABOLIC PANEL
ALBUMIN: 3.8 g/dL (ref 3.5–5.2)
ALT: 12 U/L (ref 0–35)
AST: 10 U/L (ref 0–37)
Alkaline Phosphatase: 60 U/L (ref 39–117)
BUN: 13 mg/dL (ref 6–23)
CHLORIDE: 99 meq/L (ref 96–112)
CO2: 25 mEq/L (ref 19–32)
Calcium: 10 mg/dL (ref 8.4–10.5)
Creatinine, Ser: 0.65 mg/dL (ref 0.50–1.10)
GFR calc Af Amer: >90 mL/min (ref >90)
GFR calc non Af Amer: >90 mL/min (ref >90)
Glucose, Bld: 100 mg/dL — ABNORMAL HIGH (ref 70–99)
Potassium: 3.5 mEq/L — ABNORMAL LOW (ref 3.7–5.3)
Sodium: 139 mEq/L (ref 137–147)
Total Protein: 7.4 g/dL (ref 6.0–8.3)

## 2013-12-19 LAB — CBC WITH DIFFERENTIAL/PLATELET
Basophils Absolute: 0 10*3/uL (ref 0.0–0.1)
Basophils Relative: 0 % (ref 0–1)
Eosinophils Absolute: 0.3 10*3/uL (ref 0.0–0.7)
Eosinophils Relative: 5 % (ref 0–5)
HEMATOCRIT: 38.3 % (ref 36.0–46.0)
Hemoglobin: 13.2 g/dL (ref 12.0–15.0)
Lymphocytes Relative: 31 % (ref 12–46)
Lymphs Abs: 2.1 10*3/uL (ref 0.7–4.0)
MCH: 31.9 pg (ref 26.0–34.0)
MCHC: 34.5 g/dL (ref 30.0–36.0)
MCV: 92.5 fL (ref 78.0–100.0)
MONO ABS: 0.4 10*3/uL (ref 0.1–1.0)
Monocytes Relative: 6 % (ref 3–12)
NEUTROS ABS: 3.9 10*3/uL (ref 1.7–7.7)
NEUTROS PCT: 58 % (ref 43–77)
Platelets: 254 10*3/uL (ref 150–400)
RBC: 4.14 MIL/uL (ref 3.87–5.11)
RDW: 12.6 % (ref 11.5–15.5)
WBC: 6.7 10*3/uL (ref 4.0–10.5)

## 2013-12-19 MED ORDER — PREDNISONE 20 MG PO TABS
60.0000 mg | ORAL_TABLET | Freq: Once | ORAL | Status: AC
  Administered 2013-12-19: 60 mg via ORAL
  Filled 2013-12-19: qty 3

## 2013-12-19 MED ORDER — ALBUTEROL SULFATE (2.5 MG/3ML) 0.083% IN NEBU
5.0000 mg | INHALATION_SOLUTION | Freq: Once | RESPIRATORY_TRACT | Status: AC
  Administered 2013-12-19: 5 mg via RESPIRATORY_TRACT
  Filled 2013-12-19: qty 6

## 2013-12-19 MED ORDER — ALBUTEROL SULFATE HFA 108 (90 BASE) MCG/ACT IN AERS
1.0000 | INHALATION_SPRAY | Freq: Four times a day (QID) | RESPIRATORY_TRACT | Status: DC | PRN
  Administered 2013-12-19: 1 via RESPIRATORY_TRACT
  Filled 2013-12-19: qty 6.7

## 2013-12-19 MED ORDER — ALBUTEROL SULFATE (2.5 MG/3ML) 0.083% IN NEBU: 5.0000 mg | INHALATION_SOLUTION | RESPIRATORY_TRACT | Status: DC | PRN

## 2013-12-19 MED ORDER — PREDNISONE 20 MG PO TABS: ORAL_TABLET | ORAL | Status: DC

## 2013-12-19 MED ORDER — FLUTICASONE PROPIONATE HFA 220 MCG/ACT IN AERO
2.0000 | INHALATION_SPRAY | Freq: Two times a day (BID) | RESPIRATORY_TRACT | Status: DC
  Administered 2013-12-19: 2 via RESPIRATORY_TRACT
  Filled 2013-12-19: qty 12

## 2013-12-19 NOTE — ED Notes (Signed)
Pt c/o cough and asthma related SOB since last night.  Audible wheezing noted.  Breathing easily on neb now.

## 2013-12-19 NOTE — ED Notes (Addendum)
Pt walked around unit without difficulty.  When she returned to the room, pt stated that she felt a little tight.

## 2013-12-19 NOTE — Discharge Instructions (Signed)
Call and make followup appointment with PCP as soon as possible. Take medications as prescribed. Return to ED should you experience increasing shortness of breath.

## 2013-12-19 NOTE — ED Provider Notes (Signed)
CSN: [id number]     Arrival date & time 12/19/13  1524 History  This chart was scribed for Wendy Rack, PA-C, working with Wendy Frames, MD by Maree Erie, ED Scribe. This patient was seen in room [facility]1/[facility] and the patient's care was started at 4:25 PM.    Chief Complaint  Patient presents with  . Cough  . Asthma    Patient is a 48 y.o. female presenting with cough and asthma. The history is provided by the patient. No language interpreter was used.  Cough Associated symptoms: shortness of breath (with activity) and wheezing   Associated symptoms: no chest pain, no chills and no fever   Asthma Associated symptoms include shortness of breath (with activity). Pertinent negatives include no chest pain and no abdominal pain.  Asthma Associated symptoms include coughing. Pertinent negatives include no abdominal pain, chest pain, chills, fever, nausea or vomiting.    HPI Comments: Wendy Hoffman is a 48 y.o. female with a history of child onset ashtma who presents to the Emergency Department complaining of an exacerbation of asthma that began last night. Associated symptoms include shortness of breath, cough and non-radiating chest tightness. She used her albuterol inhaler at home four times without relief. She states she typically uses the inhaler twice with exacerbations. She is currently out of refills for her inhaler. She also uses a Flovent inhaler twice daily for her asthma. She was given a nebulizer treatment in the ED and states it gave her temporary relief but she can feel the symptoms returning now. She was seen in the ED 12/25 and discharged with Prednisone, which she took compliantly and is now out of. She reports being hospitalized four times in the last year for asthma flare ups and intubated once ever for her asthma. She denies chest pain or leg swelling. She denies sick contacts but states she recently traveled to [place]. Patients states that changes in the air temperature  typically trigger her asthma to become worse.     Past Medical History  Diagnosis Date  . Asthma   . Thyroid disease   . Hypertension   . High cholesterol   . Migraine   . Morbid obesity   . H/O: hysterectomy   . Medical history non-contributory   . Hypothyroidism   . Anxiety   . Shortness of breath   . Stroke   . Cancer   . Arthritis   . Anemia    Past Surgical History  Procedure Laterality Date  . Cesarean section      3 occasions  . Thyroidectomy, partial      Goiter  . Cholecystectomy    . Abdominal hysterectomy     Family History  Problem Relation Age of Onset  . Migraines Sister    History  Substance Use Topics  . Smoking status: Current Every Day Smoker    Types: Cigarettes  . Smokeless tobacco: Not on file  . Alcohol Use: No   OB History   Grav Para Term Preterm Abortions TAB SAB Ect Mult Living                 Review of Systems  Constitutional: Negative for fever and chills.  Respiratory: Positive for cough, chest tightness, shortness of breath (with activity) and wheezing. Negative for stridor.   Cardiovascular: Negative for chest pain and leg swelling.  Gastrointestinal: Negative for nausea, vomiting and abdominal pain.  All other systems reviewed and are negative.    Allergies  Mango  flavor; Aspirin; Penicillins; Blueberry; and Penicillins  Home Medications   Current Outpatient Rx  Name  Route  Sig  Dispense  Refill  . albuterol (PROVENTIL HFA;VENTOLIN HFA) 108 (90 BASE) MCG/ACT inhaler   Inhalation   Inhale 1 puff into the lungs every 6 (six) hours as needed for wheezing or shortness of breath.   1 Inhaler   12   . fluticasone (FLOVENT HFA) 220 MCG/ACT inhaler   Inhalation   Inhale 2 puffs into the lungs 2 (two) times daily.   1 Inhaler   12   . hydrochlorothiazide (HYDRODIURIL) 25 MG tablet   Oral   Take 1 tablet (25 mg total) by mouth daily.   90 tablet   3   . predniSONE (DELTASONE) 20 MG tablet      2 tabs po daily x  5 days   10 tablet   0    Triage Vitals: BP 150/100  Pulse 99  Temp(Src) 97.5 F (36.4 C) (Oral)  Resp 22  SpO2 99%  Physical Exam  Nursing note and vitals reviewed. Constitutional: She is oriented to person, place, and time. She appears well-developed and well-nourished. No distress.  HENT:  Head: Normocephalic and atraumatic.  Eyes: Conjunctivae and EOM are normal.  Neck: Trachea normal, normal range of motion, full passive range of motion without pain and phonation normal. Neck supple. No rigidity. No tracheal deviation present.  Cardiovascular: Normal rate, regular rhythm and normal heart sounds.   Pulmonary/Chest: No stridor. She has wheezes (Diffuse). She has no rhonchi. She has no rales.  Diffuse expiratory wheezing. Patient able to speak full sentences but appears to have difficulty breathing while doing so. Patient slightly leaned forward in chair with labored breathing.   Musculoskeletal: Normal range of motion.  Lymphadenopathy:    She has no cervical adenopathy.  Neurological: She is alert and oriented to person, place, and time.  Skin: Skin is warm and dry. She is not diaphoretic. No pallor.  Psychiatric: She has a normal mood and affect. Her behavior is normal. Her mood appears not anxious. Her speech is not rapid and/or pressured. She is communicative.    ED Course  Procedures (including critical care time)  DIAGNOSTIC STUDIES: Oxygen Saturation is 99% on room air, normal by my interpretation.    COORDINATION OF CARE: 4:30 PM - Patient verbalizes understanding and agrees with treatment plan.    Labs Review Labs Reviewed  COMPREHENSIVE METABOLIC PANEL - Abnormal; Notable for the following:    Potassium 3.5 (*)    Glucose, Bld 100 (*)    Total Bilirubin <0.2 (*)    All other components within normal limits  CBC WITH DIFFERENTIAL   Imaging Review Dg Chest 2 View  12/19/2013   CLINICAL DATA:  Shortness of breath, cough, history asthma, hypertension,  stroke, smoking, obesity, thyroid disease, anemia  EXAM: CHEST  2 VIEW  COMPARISON:  12/06/2013  FINDINGS: Upper normal heart size.  Mediastinal contours and pulmonary vascularity normal.  Minimal bronchitic changes.  No acute infiltrate, pleural effusion or pneumothorax.  Bones unremarkable.  IMPRESSION: Minimal bronchitic changes.   Electronically Signed   By: Lavonia Dana M.D.   On: 12/19/2013 17:46    EKG Interpretation   None       MDM   1. Asthma exacerbation    Patient markedly improved after multiple breathing treatments in ED. On reexamination Wheezing has completely resolved, patient is leaned back in chair without labored breathing. Patient appears in NAD. CXR negative. No  leukocytosis. Patient afebrile with 99% O2 sat on RA.   I believe patient is stable for discharge at this time. Patient states she feels comfortable going home as long as she has refills of her inhalers.   Advised patient call and make followup appointment with PCP as soon as possible. Take medications as prescribed. Return to ED should you experience increasing shortness of breath, fever or chills. Patient agrees with this plan. Gives thanks for help. Patient discharged in good condition.   Meds given in ED:  Medications  albuterol (PROVENTIL) (2.5 MG/3ML) 0.083% nebulizer solution 5 mg (5 mg Nebulization Given 12/19/13 1535)  albuterol (PROVENTIL) (2.5 MG/3ML) 0.083% nebulizer solution 5 mg (5 mg Nebulization Given 12/19/13 1710)  predniSONE (DELTASONE) tablet 60 mg (60 mg Oral Given 12/19/13 1706)  albuterol (PROVENTIL) (2.5 MG/3ML) 0.083% nebulizer solution 5 mg (5 mg Nebulization Given 12/19/13 1837)    Discharge Medication List as of 12/19/2013  7:05 PM    START taking these medications   Details  predniSONE (DELTASONE) 20 MG tablet 2 tabs po daily x 5 days, Print         I personally performed the services described in this documentation, which was scribed in my presence. The recorded information has  been reviewed and is accurate.   Sherrie George, PA-C 12/20/13 1616

## 2013-12-19 NOTE — Progress Notes (Signed)
[facility] CL provided pt with a list of self-pay pcp and [facility] information. Patient stated that her pcp is [facility]. CL asked whether they had ever spoke with patient about the [employer] program. Patient stated that she had an application. Then patient stated that she did not meet criteria. CL asked patient to explain and patient stated that she did not meet criteria because of the paperwork.

## 2013-12-25 NOTE — ED Provider Notes (Signed)
Medical screening examination/treatment/procedure(s) were performed by non-physician practitioner and as supervising physician I was immediately available for consultation/collaboration.    Kathalene Frames, MD 12/25/13 [phone]

## 2014-01-07 ENCOUNTER — Encounter (HOSPITAL_COMMUNITY): Payer: Self-pay | Admitting: Emergency Medicine

## 2014-01-07 ENCOUNTER — Emergency Department (HOSPITAL_COMMUNITY)
Admission: EM | Admit: 2014-01-07 | Discharge: 2014-01-07 | Disposition: A | Discharge status: Home / Self Care | Payer: Self-pay | Attending: Emergency Medicine | Admitting: Emergency Medicine

## 2014-01-07 DIAGNOSIS — I10 Essential (primary) hypertension: Secondary | ICD-10-CM | POA: Insufficient documentation

## 2014-01-07 DIAGNOSIS — IMO0002 Reserved for concepts with insufficient information to code with codable children: Secondary | ICD-10-CM | POA: Insufficient documentation

## 2014-01-07 DIAGNOSIS — Z79899 Other long term (current) drug therapy: Secondary | ICD-10-CM | POA: Insufficient documentation

## 2014-01-07 DIAGNOSIS — E669 Obesity, unspecified: Secondary | ICD-10-CM | POA: Insufficient documentation

## 2014-01-07 DIAGNOSIS — R0602 Shortness of breath: Secondary | ICD-10-CM | POA: Insufficient documentation

## 2014-01-07 DIAGNOSIS — F17200 Nicotine dependence, unspecified, uncomplicated: Secondary | ICD-10-CM | POA: Insufficient documentation

## 2014-01-07 DIAGNOSIS — F411 Generalized anxiety disorder: Secondary | ICD-10-CM | POA: Insufficient documentation

## 2014-01-07 DIAGNOSIS — J45909 Unspecified asthma, uncomplicated: Secondary | ICD-10-CM | POA: Insufficient documentation

## 2014-01-07 DIAGNOSIS — E039 Hypothyroidism, unspecified: Secondary | ICD-10-CM | POA: Insufficient documentation

## 2014-01-07 DIAGNOSIS — E7800 Pure hypercholesterolemia, unspecified: Secondary | ICD-10-CM | POA: Insufficient documentation

## 2014-01-07 DIAGNOSIS — Z880 Allergy status to penicillin: Secondary | ICD-10-CM | POA: Insufficient documentation

## 2014-01-07 DIAGNOSIS — M129 Arthropathy, unspecified: Secondary | ICD-10-CM | POA: Insufficient documentation

## 2014-01-07 MED ORDER — ALBUTEROL SULFATE (2.5 MG/3ML) 0.083% IN NEBU: 2.5000 mg | INHALATION_SOLUTION | RESPIRATORY_TRACT | Status: DC | PRN

## 2014-01-07 MED ORDER — PREDNISONE 20 MG PO TABS: ORAL_TABLET | ORAL | Status: DC

## 2014-01-07 MED ORDER — ALBUTEROL SULFATE (2.5 MG/3ML) 0.083% IN NEBU
5.0000 mg | INHALATION_SOLUTION | Freq: Once | RESPIRATORY_TRACT | Status: AC
  Administered 2014-01-07: 5 mg via RESPIRATORY_TRACT
  Filled 2014-01-07: qty 6

## 2014-01-07 MED ORDER — PREDNISONE 20 MG PO TABS
60.0000 mg | ORAL_TABLET | Freq: Once | ORAL | Status: AC
  Administered 2014-01-07: 60 mg via ORAL
  Filled 2014-01-07: qty 3

## 2014-01-07 MED ORDER — IPRATROPIUM BROMIDE 0.02 % IN SOLN
0.5000 mg | Freq: Once | RESPIRATORY_TRACT | Status: AC
  Administered 2014-01-07: 0.5 mg via RESPIRATORY_TRACT
  Filled 2014-01-07: qty 2.5

## 2014-01-07 MED ORDER — ALBUTEROL SULFATE HFA 108 (90 BASE) MCG/ACT IN AERS
2.0000 | INHALATION_SPRAY | RESPIRATORY_TRACT | Status: DC | PRN
  Administered 2014-01-07: 2 via RESPIRATORY_TRACT
  Filled 2014-01-07: qty 6.7

## 2014-01-07 MED ORDER — ALBUTEROL SULFATE HFA 108 (90 BASE) MCG/ACT IN AERS: 2.0000 | INHALATION_SPRAY | RESPIRATORY_TRACT | Status: DC | PRN

## 2014-01-07 NOTE — ED Provider Notes (Signed)
CSN: [id number]     Arrival date & time 01/07/14  0757 History   First MD Initiated Contact with Patient 01/07/14 0818     Chief Complaint  Patient presents with  . Asthma   (Consider location/radiation/quality/duration/timing/severity/associated sxs/prior Treatment) HPI Complains of asthma, wheezing onset 2 days ago. She's been using her albuterol nebulizer and inhaler every hour with transient relief. She ran out of her albuterol inhaler and nebulizer this morning. Nothing makes symptoms better or worse. No other associated symptoms. Past Medical History  Diagnosis Date  . Asthma   . Thyroid disease   . Hypertension   . High cholesterol   . Migraine   . Morbid obesity   . H/O: hysterectomy   . Medical history non-contributory   . Hypothyroidism   . Anxiety   . Shortness of breath   . Stroke   . Cancer   . Arthritis   . Anemia    Past Surgical History  Procedure Laterality Date  . Cesarean section      3 occasions  . Thyroidectomy, partial      Goiter  . Cholecystectomy    . Abdominal hysterectomy     Family History  Problem Relation Age of Onset  . Migraines Sister    History  Substance Use Topics  . Smoking status: Current Every Day Smoker    Types: Cigarettes  . Smokeless tobacco: Not on file  . Alcohol Use: No   quit smoking one month ago OB History   Grav Para Term Preterm Abortions TAB SAB Ect Mult Living                 Review of Systems  Constitutional: Negative.   HENT: Negative.   Respiratory: Positive for shortness of breath and wheezing.   Cardiovascular: Negative.   Gastrointestinal: Negative.   Musculoskeletal: Negative.   Skin: Negative.   Neurological: Negative.   Psychiatric/Behavioral: Negative.   All other systems reviewed and are negative.    Allergies  Mango flavor; Aspirin; Penicillins; Blueberry; and Penicillins  Home Medications   Current Outpatient Rx  Name  Route  Sig  Dispense  Refill  . albuterol (PROVENTIL  HFA;VENTOLIN HFA) 108 (90 BASE) MCG/ACT inhaler   Inhalation   Inhale 1 puff into the lungs every 6 (six) hours as needed for wheezing or shortness of breath.   1 Inhaler   12   . fluticasone (FLOVENT HFA) 220 MCG/ACT inhaler   Inhalation   Inhale 2 puffs into the lungs 2 (two) times daily.   1 Inhaler   12   . hydrochlorothiazide (HYDRODIURIL) 25 MG tablet   Oral   Take 1 tablet (25 mg total) by mouth daily.   90 tablet   3   . predniSONE (DELTASONE) 20 MG tablet      2 tabs po daily x 5 days   10 tablet   0    BP 139/96  Pulse 95  Temp(Src) 98.6 F (37 C)  Resp 20  SpO2 98% Physical Exam  Nursing note and vitals reviewed. Constitutional: She appears well-developed and well-nourished. No distress.  HENT:  Head: Normocephalic and atraumatic.  Eyes: Conjunctivae are normal. Pupils are equal, round, and reactive to light.  Neck: Neck supple. No tracheal deviation present. No thyromegaly present.  Cardiovascular: Normal rate and regular rhythm.   No murmur heard. Pulmonary/Chest: Effort normal and breath sounds normal. No respiratory distress.  Prolonged expiratory phase with expiratory wheezes speaks in paragraphs    Abdominal:  Soft. Bowel sounds are normal. She exhibits no distension. There is no tenderness.  Obese  Musculoskeletal: Normal range of motion. She exhibits no edema and no tenderness.  Neurological: She is alert. Coordination normal.  Skin: Skin is warm and dry. No rash noted.  Psychiatric: She has a normal mood and affect.    ED Course  Procedures (including critical care time) Labs Review Labs Reviewed - No data to display Imaging Review No results found.  EKG Interpretation   None      10:30 AM patient is breathing at baseline after treatment with 2 nebulized treatments and prednisone. She speaks in paragraphs. No respiratory distress. Lungs clear auscultation  MDM  No diagnosis found. Plan albuterol HFA ago. She has spacer at home 2  puffs every 4 hours when necessary shortness of breath. She is instructed to return if albuterol needed more than every 4 hours we will also write a prescription for albuterol nebulizer and albuterol HFA, and prednisone Diagnosis exacerbation of asthma    Orlie Dakin, MD 01/07/14 1041

## 2014-01-07 NOTE — ED Notes (Signed)
Pt states feels better; sats 97%

## 2014-01-07 NOTE — Progress Notes (Signed)
[facility] CL spoke with patient about [facility] [employer]. Patient stated that she just started going to [place]. CL asked if they had spoke with patient about program and she stated that she had the paperwork. Patient declined CL offer to set her up with a follow-up apt and a apt for enrollment, stating that she would call them.

## 2014-01-07 NOTE — ED Notes (Signed)
Pt c/o asthma problems since Saturday; states out of enhaler; no fever; no productive cough

## 2014-01-07 NOTE — Discharge Instructions (Signed)
Asthma, Adult Use your inhaler or nebulizer every 4 hours as needed for shortness of breath or wheeze. Return if needed more than every 4 hours or see the wellness Center. Take the prednisone as prescribed starting tomorrow Asthma is a condition of the lungs in which the airways tighten and narrow. Asthma can make it hard to breathe. Asthma cannot be cured, but medicine and lifestyle changes can help control it. Asthma may be started (triggered) by:  Animal skin flakes (dander).  Dust.  Cockroaches.  Pollen.  Mold.  Smoke.  Cleaning products.  Hair sprays or aerosol sprays.  Paint fumes or strong smells.  Cold air, weather changes, and winds.  Crying or laughing hard.  Stress.  Certain medicines or drugs.  Foods, such as dried fruit, potato chips, and sparkling grape juice.  Infections or conditions (colds, flu).  Exercise.  Certain medical conditions or diseases.  Exercise or tiring activities. HOME CARE   Take medicine as told by your doctor.  Use a peak flow meter as told by your doctor. A peak flow meter is a tool that measures how well the lungs are working.  Record and keep track of the peak flow meter's readings.  Understand and use the asthma action plan. An asthma action plan is a written plan for taking care of your asthma and treating your attacks.  To help prevent asthma attacks:  Do not smoke. Stay away from secondhand smoke.  Change your heating and air conditioning filter often.  Limit your use of fireplaces and wood stoves.  Get rid of pests (such as roaches and mice) and their droppings.  Throw away plants if you see mold on them.  Clean your floors. Dust regularly. Use cleaning products that do not smell.  Have someone vacuum when you are not home. Use a vacuum cleaner with a HEPA filter if possible.  Replace carpet with wood, tile, or vinyl flooring. Carpet can trap animal skin flakes and dust.  Use allergy-proof pillows, mattress  covers, and box spring covers.  Wash bed sheets and blankets every week in hot water and dry them in a dryer.  Use blankets that are made of polyester or cotton.  Clean bathrooms and kitchens with bleach. If possible, have someone repaint the walls in these rooms with mold-resistant paint. Keep out of the rooms that are being cleaned and painted.  Wash hands often. GET HELP IF:  You have make a whistling sound when breaking (wheeze), have shortness of breath, or have a cough even if taking medicine to prevent attacks.  The colored mucus you cough up (sputum) is thicker than usual.  The colored mucus you cough up changes from clear or white to yellow, green, gray, or bloody.  You have problems from the medicine you are taking such as:  A rash.  Itching.  Swelling.  Trouble breathing.  You need reliever medicines more than 2 3 times a week.  Your peak flow measurement is still at 50 79% of your personal best after following the action plan for 1 hour. GET HELP RIGHT AWAY IF:   You seem to be worse and are not responding to medicine during an asthma attack.  You are short of breath even at rest.  You get short of breath when doing very little activity.  You have trouble eating, drinking, or talking.  You have chest pain.  You have a fast heartbeat.  Your lips or fingernails start to turn blue.  You are lightheaded, dizzy, or  faint.  Your peak flow is less than 50% of your personal best.  You have a fever or lasting symptoms for more than 2 3 days.  You have a fever and your symptoms suddenly get worse. MAKE SURE YOU:   Understand these instructions.  Will watch your condition.  Will get help right away if you are not doing well or get worse. Document Released: 05/17/2008 Document Revised: 09/19/2013 Document Reviewed: 06/28/2013 [facility] Patient Information 2014 [place], [place].

## 2014-01-28 ENCOUNTER — Emergency Department (HOSPITAL_COMMUNITY)
Admission: EM | Admit: 2014-01-28 | Discharge: 2014-01-28 | Disposition: A | Discharge status: Home / Self Care | Payer: Self-pay | Attending: Emergency Medicine | Admitting: Emergency Medicine

## 2014-01-28 ENCOUNTER — Encounter (HOSPITAL_COMMUNITY): Payer: Self-pay | Admitting: Emergency Medicine

## 2014-01-28 DIAGNOSIS — G43909 Migraine, unspecified, not intractable, without status migrainosus: Secondary | ICD-10-CM

## 2014-01-28 DIAGNOSIS — Z90710 Acquired absence of both cervix and uterus: Secondary | ICD-10-CM | POA: Insufficient documentation

## 2014-01-28 DIAGNOSIS — Z862 Personal history of diseases of the blood and blood-forming organs and certain disorders involving the immune mechanism: Secondary | ICD-10-CM | POA: Insufficient documentation

## 2014-01-28 DIAGNOSIS — Z8673 Personal history of transient ischemic attack (TIA), and cerebral infarction without residual deficits: Secondary | ICD-10-CM | POA: Insufficient documentation

## 2014-01-28 DIAGNOSIS — Z79899 Other long term (current) drug therapy: Secondary | ICD-10-CM | POA: Insufficient documentation

## 2014-01-28 DIAGNOSIS — IMO0002 Reserved for concepts with insufficient information to code with codable children: Secondary | ICD-10-CM | POA: Insufficient documentation

## 2014-01-28 DIAGNOSIS — J45909 Unspecified asthma, uncomplicated: Secondary | ICD-10-CM | POA: Insufficient documentation

## 2014-01-28 DIAGNOSIS — Z859 Personal history of malignant neoplasm, unspecified: Secondary | ICD-10-CM | POA: Insufficient documentation

## 2014-01-28 DIAGNOSIS — F17200 Nicotine dependence, unspecified, uncomplicated: Secondary | ICD-10-CM | POA: Insufficient documentation

## 2014-01-28 DIAGNOSIS — Z880 Allergy status to penicillin: Secondary | ICD-10-CM | POA: Insufficient documentation

## 2014-01-28 DIAGNOSIS — Z8739 Personal history of other diseases of the musculoskeletal system and connective tissue: Secondary | ICD-10-CM | POA: Insufficient documentation

## 2014-01-28 DIAGNOSIS — I10 Essential (primary) hypertension: Secondary | ICD-10-CM | POA: Insufficient documentation

## 2014-01-28 DIAGNOSIS — Z8659 Personal history of other mental and behavioral disorders: Secondary | ICD-10-CM | POA: Insufficient documentation

## 2014-01-28 MED ORDER — HYDROMORPHONE HCL PF 2 MG/ML IJ SOLN
2.0000 mg | Freq: Once | INTRAMUSCULAR | Status: AC
  Administered 2014-01-28: 2 mg via INTRAMUSCULAR
  Filled 2014-01-28: qty 1

## 2014-01-28 MED ORDER — DIPHENHYDRAMINE HCL 25 MG PO CAPS
25.0000 mg | ORAL_CAPSULE | Freq: Once | ORAL | Status: AC
  Administered 2014-01-28: 25 mg via ORAL
  Filled 2014-01-28: qty 1

## 2014-01-28 MED ORDER — METOCLOPRAMIDE HCL 10 MG PO TABS
10.0000 mg | ORAL_TABLET | Freq: Once | ORAL | Status: AC
  Administered 2014-01-28: 10 mg via ORAL
  Filled 2014-01-28: qty 1

## 2014-01-28 MED ORDER — KETOROLAC TROMETHAMINE 60 MG/2ML IM SOLN
60.0000 mg | Freq: Once | INTRAMUSCULAR | Status: AC
  Administered 2014-01-28: 60 mg via INTRAMUSCULAR
  Filled 2014-01-28: qty 2

## 2014-01-28 NOTE — ED Provider Notes (Signed)
CSN: [id number]     Arrival date & time 01/28/14  1725 History   First MD Initiated Contact with Patient 01/28/14 1825     This chart was scribed for non-physician practitioner, Margarita Mail, PA-C, working with Tanna Furry, MD by Forrestine Him, ED Scribe. This patient was seen in room WTR8/WTR8 and the patient's care was started at 6:39 PM.   Chief Complaint  Patient presents with  . Migraine   The history is provided by the patient. No language interpreter was used.    HPI Comments: Wendy Hoffman is a 48 y.o. Female with multiple medical problems including a PMHx of Migraines who presents to the Emergency Department complaining of a new right sided Migaine described as "pressure" that initially started yesterday morning. She admits to photophobia, nausea, blurred vision, and mild senstitivity to sound. She has tried Excedrin Migraine with mild temporary relief. She denies currently being on any prescribed medication for her Migraines. At this time she denies any fever or chills. Pt has no other concerns or complaints this visit.  Past Medical History  Diagnosis Date  . Asthma   . Thyroid disease   . Hypertension   . High cholesterol   . Migraine   . Morbid obesity   . H/O: hysterectomy   . Medical history non-contributory   . Hypothyroidism   . Anxiety   . Shortness of breath   . Stroke   . Cancer   . Arthritis   . Anemia    Past Surgical History  Procedure Laterality Date  . Cesarean section      3 occasions  . Thyroidectomy, partial      Goiter  . Cholecystectomy    . Abdominal hysterectomy     Family History  Problem Relation Age of Onset  . Migraines Sister    History  Substance Use Topics  . Smoking status: Current Every Day Smoker    Types: Cigarettes  . Smokeless tobacco: Not on file  . Alcohol Use: No   OB History   Grav Para Term Preterm Abortions TAB SAB Ect Mult Living                 Review of Systems  Constitutional: Negative for fever and  chills.  Eyes: Positive for photophobia. Negative for visual disturbance.  Respiratory: Negative for shortness of breath.   Gastrointestinal: Negative for nausea.  Genitourinary: Negative for dysuria.  Musculoskeletal: Negative for back pain, joint swelling and neck pain.  Skin: Negative for rash.  Neurological: Positive for headaches.  Psychiatric/Behavioral: Negative for confusion.      Allergies  Mango flavor; Aspirin; Penicillins; Blueberry; and Penicillins  Home Medications   Current Outpatient Rx  Name  Route  Sig  Dispense  Refill  . albuterol (PROVENTIL HFA;VENTOLIN HFA) 108 (90 BASE) MCG/ACT inhaler   Inhalation   Inhale 1 puff into the lungs every 6 (six) hours as needed for wheezing or shortness of breath.   1 Inhaler   12   . albuterol (PROVENTIL) (2.5 MG/3ML) 0.083% nebulizer solution   Nebulization   Take 3 mLs (2.5 mg total) by nebulization every 4 (four) hours as needed for wheezing or shortness of breath.   75 mL   12   . fluticasone (FLOVENT HFA) 220 MCG/ACT inhaler   Inhalation   Inhale 2 puffs into the lungs 2 (two) times daily.   1 Inhaler   12   . hydrochlorothiazide (HYDRODIURIL) 25 MG tablet   Oral   Take  1 tablet (25 mg total) by mouth daily.   90 tablet   3    Triage Vitals: BP 140/84  Pulse 88  Temp(Src) 98.4 F (36.9 C)  Resp 18  SpO2 99%  Physical Exam  Nursing note and vitals reviewed. Constitutional: She is oriented to person, place, and time. She appears well-developed and well-nourished.  HENT:  Head: Normocephalic and atraumatic.  Eyes: EOM are normal.  Neck: Normal range of motion.  Cardiovascular: Normal rate.   Pulmonary/Chest: Effort normal.  Musculoskeletal: Normal range of motion.  Neurological: She is alert and oriented to person, place, and time.  Skin: Skin is warm and dry.  Psychiatric: She has a normal mood and affect. Her behavior is normal.    ED Course  Procedures (including critical care  time)  DIAGNOSTIC STUDIES: Oxygen Saturation is 99% on RA, Normal by my interpretation.    COORDINATION OF CARE: 6:39 PM- Will give Toradol, Reglan, and Benadryl. Discussed treatment plan with pt at bedside and pt agreed to plan.     Labs Review Labs Reviewed - No data to display Imaging Review No results found.  EKG Interpretation   None       MDM   Final diagnoses:  Migraine    Pt HA is like pts typical HA and non concerning for [facility], ICH, Meningitis, or temporal arteritis. Pt is afebrile with no focal neuro deficits, nuchal rigidity, or change in vision.  Still c/o pain after toradol, reglan and benadryl . Patient given IM dilaudid. I have given the patient to PA Sanders in [place].    I personally performed the services described in this documentation, which was scribed in my presence. The recorded information has been reviewed and is accurate.      Margarita Mail, PA-C 01/29/14 [phone]

## 2014-01-28 NOTE — ED Notes (Signed)
Per pt, migraine since yesterday-no relief with OTC meds

## 2014-01-28 NOTE — Discharge Instructions (Signed)
May take excedrin migraine, tension relief, or other over the counter medications if headache returns. Follow up with your primary care physician. Return to the ED for new concerns.

## 2014-02-01 NOTE — ED Provider Notes (Signed)
Medical screening examination/treatment/procedure(s) were performed by non-physician practitioner and as supervising physician I was immediately available for consultation/collaboration.  EKG Interpretation   None         Tanna Furry, MD 02/01/14 [phone]

## 2014-02-13 ENCOUNTER — Emergency Department (HOSPITAL_COMMUNITY)
Admission: EM | Admit: 2014-02-13 | Discharge: 2014-02-14 | Disposition: A | Discharge status: Home / Self Care | Payer: Self-pay | Attending: Emergency Medicine | Admitting: Emergency Medicine

## 2014-02-13 ENCOUNTER — Encounter (HOSPITAL_COMMUNITY): Payer: Self-pay | Admitting: Emergency Medicine

## 2014-02-13 DIAGNOSIS — IMO0002 Reserved for concepts with insufficient information to code with codable children: Secondary | ICD-10-CM | POA: Insufficient documentation

## 2014-02-13 DIAGNOSIS — M542 Cervicalgia: Secondary | ICD-10-CM | POA: Insufficient documentation

## 2014-02-13 DIAGNOSIS — G43909 Migraine, unspecified, not intractable, without status migrainosus: Secondary | ICD-10-CM | POA: Insufficient documentation

## 2014-02-13 DIAGNOSIS — Z862 Personal history of diseases of the blood and blood-forming organs and certain disorders involving the immune mechanism: Secondary | ICD-10-CM | POA: Insufficient documentation

## 2014-02-13 DIAGNOSIS — Z880 Allergy status to penicillin: Secondary | ICD-10-CM | POA: Insufficient documentation

## 2014-02-13 DIAGNOSIS — R42 Dizziness and giddiness: Secondary | ICD-10-CM | POA: Insufficient documentation

## 2014-02-13 DIAGNOSIS — I10 Essential (primary) hypertension: Secondary | ICD-10-CM | POA: Insufficient documentation

## 2014-02-13 DIAGNOSIS — F17200 Nicotine dependence, unspecified, uncomplicated: Secondary | ICD-10-CM | POA: Insufficient documentation

## 2014-02-13 DIAGNOSIS — M129 Arthropathy, unspecified: Secondary | ICD-10-CM | POA: Insufficient documentation

## 2014-02-13 DIAGNOSIS — Z79899 Other long term (current) drug therapy: Secondary | ICD-10-CM | POA: Insufficient documentation

## 2014-02-13 DIAGNOSIS — J45901 Unspecified asthma with (acute) exacerbation: Secondary | ICD-10-CM | POA: Insufficient documentation

## 2014-02-13 DIAGNOSIS — Z8659 Personal history of other mental and behavioral disorders: Secondary | ICD-10-CM | POA: Insufficient documentation

## 2014-02-13 DIAGNOSIS — Z8673 Personal history of transient ischemic attack (TIA), and cerebral infarction without residual deficits: Secondary | ICD-10-CM | POA: Insufficient documentation

## 2014-02-13 DIAGNOSIS — Z859 Personal history of malignant neoplasm, unspecified: Secondary | ICD-10-CM | POA: Insufficient documentation

## 2014-02-13 MED ORDER — KETOROLAC TROMETHAMINE 15 MG/ML IJ SOLN
30.0000 mg | Freq: Once | INTRAMUSCULAR | Status: AC
  Administered 2014-02-13: 30 mg via INTRAVENOUS
  Filled 2014-02-13: qty 2

## 2014-02-13 MED ORDER — HYDROMORPHONE HCL PF 1 MG/ML IJ SOLN
1.0000 mg | Freq: Once | INTRAMUSCULAR | Status: AC
  Administered 2014-02-13: 1 mg via INTRAVENOUS
  Filled 2014-02-13: qty 1

## 2014-02-13 MED ORDER — HYDROCODONE-ACETAMINOPHEN 5-325 MG PO TABS: ORAL_TABLET | ORAL | Status: DC

## 2014-02-13 MED ORDER — IPRATROPIUM-ALBUTEROL 0.5-2.5 (3) MG/3ML IN SOLN
3.0000 mL | Freq: Once | RESPIRATORY_TRACT | Status: AC
Start: 2014-02-13 — End: 2014-02-13
  Administered 2014-02-13: 3 mL via RESPIRATORY_TRACT
  Filled 2014-02-13: qty 3

## 2014-02-13 MED ORDER — DEXAMETHASONE SODIUM PHOSPHATE 10 MG/ML IJ SOLN
10.0000 mg | Freq: Once | INTRAMUSCULAR | Status: AC
  Administered 2014-02-13: 10 mg via INTRAVENOUS
  Filled 2014-02-13: qty 1

## 2014-02-13 MED ORDER — DIPHENHYDRAMINE HCL 50 MG/ML IJ SOLN
25.0000 mg | Freq: Once | INTRAMUSCULAR | Status: AC
  Administered 2014-02-13: 25 mg via INTRAVENOUS
  Filled 2014-02-13: qty 1

## 2014-02-13 MED ORDER — IPRATROPIUM BROMIDE 0.02 % IN SOLN: 0.5000 mg | Freq: Once | RESPIRATORY_TRACT | Status: DC

## 2014-02-13 MED ORDER — PROMETHAZINE HCL 25 MG PO TABS: 25.0000 mg | ORAL_TABLET | Freq: Four times a day (QID) | ORAL | Status: DC | PRN

## 2014-02-13 MED ORDER — ALBUTEROL SULFATE (2.5 MG/3ML) 0.083% IN NEBU: 2.5000 mg | INHALATION_SOLUTION | Freq: Once | RESPIRATORY_TRACT | Status: DC

## 2014-02-13 MED ORDER — SODIUM CHLORIDE 0.9 % IV BOLUS (SEPSIS)
1000.0000 mL | INTRAVENOUS | Status: AC
  Administered 2014-02-13: 1000 mL via INTRAVENOUS

## 2014-02-13 MED ORDER — METOCLOPRAMIDE HCL 5 MG/ML IJ SOLN
10.0000 mg | Freq: Once | INTRAMUSCULAR | Status: AC
  Administered 2014-02-13: 10 mg via INTRAVENOUS
  Filled 2014-02-13: qty 2

## 2014-02-13 NOTE — ED Notes (Signed)
Pt notes smelling ammonia constantly for the past month, no matter where she is.

## 2014-02-13 NOTE — ED Notes (Signed)
One unsuccessful attempt at IV insertion. Second RN to attempt per pt request.

## 2014-02-13 NOTE — ED Provider Notes (Signed)
CSN: [id number]     Arrival date & time 02/13/14  1541 History   First MD Initiated Contact with Patient 02/13/14 1816     Chief Complaint  Patient presents with  . Migraine     (Consider location/radiation/quality/duration/timing/severity/associated sxs/prior Treatment) HPI Pt is a 48yo female with hx of asthma and migraines c/o headache x1 week that is right sided, constant, throbbing, pressure 10/10, worse with light, sound, and certain smells.  Pt also c/o nausea but no vomiting. Headache was gradual in onset, feels like previous migraines but has not improved with home medications including tylenol, aleve, and Excedrin.  Denies recent illness, increased stress or change in diet. Denies numbness or tingling. Denies change in vision or balance. Denies fever, cough or congestion. Denies trauma. Reports she was dx with migraines by a neurologist but does not have insurance so she has not f/u with neurology in "a long time."    PCP: [place] [facility] [place]  Past Medical History  Diagnosis Date  . Asthma   . Thyroid disease   . Hypertension   . High cholesterol   . Migraine   . Morbid obesity   . H/O: hysterectomy   . Medical history non-contributory   . Hypothyroidism   . Anxiety   . Shortness of breath   . Stroke   . Arthritis   . Anemia   . Cancer    Past Surgical History  Procedure Laterality Date  . Cesarean section      3 occasions  . Thyroidectomy, partial      Goiter  . Cholecystectomy    . Abdominal hysterectomy     Family History  Problem Relation Age of Onset  . Migraines Sister    History  Substance Use Topics  . Smoking status: Current Every Day Smoker    Types: Cigarettes  . Smokeless tobacco: Not on file  . Alcohol Use: No   OB History   Grav Para Term Preterm Abortions TAB SAB Ect Mult Living                 Review of Systems  Constitutional: Negative for fever and chills.  Gastrointestinal: Positive for nausea. Negative for vomiting.   Musculoskeletal: Positive for neck pain. Negative for back pain and neck stiffness.  Neurological: Positive for dizziness ( intermittent) and headaches. Negative for syncope, weakness and light-headedness.  All other systems reviewed and are negative.      Allergies  Mango flavor; Aspirin; Penicillins; Blueberry; and Penicillins  Home Medications   Current Outpatient Rx  Name  Route  Sig  Dispense  Refill  . albuterol (PROVENTIL HFA;VENTOLIN HFA) 108 (90 BASE) MCG/ACT inhaler   Inhalation   Inhale 1 puff into the lungs every 6 (six) hours as needed for wheezing or shortness of breath.   1 Inhaler   12   . albuterol (PROVENTIL) (2.5 MG/3ML) 0.083% nebulizer solution   Nebulization   Take 3 mLs (2.5 mg total) by nebulization every 4 (four) hours as needed for wheezing or shortness of breath.   75 mL   12   . aspirin-acetaminophen-caffeine (EXCEDRIN MIGRAINE) 884-166-06 MG per tablet   Oral   Take 1 tablet by mouth every 6 (six) hours as needed for headache or migraine.         . fluticasone (FLOVENT HFA) 220 MCG/ACT inhaler   Inhalation   Inhale 2 puffs into the lungs 2 (two) times daily.   1 Inhaler   12   .  hydrochlorothiazide (HYDRODIURIL) 25 MG tablet   Oral   Take 1 tablet (25 mg total) by mouth daily.   90 tablet   3   . naproxen sodium (ANAPROX) 220 MG tablet   Oral   Take 440 mg by mouth 2 (two) times daily as needed (pain).         Marland Kitchen HYDROcodone-acetaminophen (NORCO/VICODIN) 5-325 MG per tablet      Take 1-2 tabs every 4-6 hours as needed for pain.   6 tablet   0   . promethazine (PHENERGAN) 25 MG tablet   Oral   Take 1 tablet (25 mg total) by mouth every 6 (six) hours as needed for nausea or vomiting.   12 tablet   0    BP 161/95  Pulse 85  Temp(Src) 98.4 F (36.9 C) (Oral)  Resp 18  SpO2 97% Physical Exam  Nursing note and vitals reviewed. Constitutional: She is oriented to person, place, and time. She appears well-developed and  well-nourished.  Pt lying in exam bed lights turned out, sunglasses on, appears uncomfortable.  HENT:  Head: Normocephalic and atraumatic.  Eyes: Conjunctivae and EOM are normal. Pupils are equal, round, and reactive to light. No scleral icterus.  Neck: Normal range of motion. Neck supple.  No nuchal rigidity or meningeal signs.  Cardiovascular: Normal rate, regular rhythm and normal heart sounds.   Pulmonary/Chest: Effort normal. No respiratory distress. She has wheezes. She has no rales. She exhibits no tenderness.  No respiratory distress. Diffuse expiratory and inspiratory wheezes.  Abdominal: Soft. Bowel sounds are normal. She exhibits no distension and no mass. There is no tenderness. There is no rebound and no guarding.  Musculoskeletal: Normal range of motion.  Neurological: She is alert and oriented to person, place, and time. No cranial nerve deficit. Coordination normal.  Alert and oriented. Answers questions appropriately. No ataxia. CN II-XII grossly in tact. 5/5 grip strength bilaterally, 5/5 strength bilaterally plantar flexion and dorsiflexion.   Skin: Skin is warm and dry.    ED Course  Procedures (including critical care time) Labs Review Labs Reviewed - No data to display Imaging Review No results found.   EKG Interpretation None      MDM   Final diagnoses:  Migraine    Pt presenting with headache, gradual in onset, feels like previous migraines.  Normal neuro exam. Vitals: WNL.  Denies hx of trauma.  Do not believe imaging needed at this time. Not concerned for emergent process taking place. Will tx symptomatically as needed for pain.  On exam pt did have diffuse expiratory and inspiratory wheezes.  Denies difficulty breathing but states she would like a breathing tx.  Duoneb given.    Tx in ED: fluids, toradol, benadryl, reglan.   Pt still c/o headache.  1mg  dilaudid given.  11:38 PM after decadron and 2nd dose of dilaudid, pt states pain is down from a  10 to a 7. States she feels comfortable being discharged home to f/u with neurology.  Info for  [employer] given. Advised to also f/u with her PCP for recurrent migraines. Rx: Vicodin and phenergan.  Return precautions provided. Pt verbalized understanding and agreement with tx plan.    Noland Fordyce, PA-C 02/14/14 0008

## 2014-02-13 NOTE — ED Notes (Signed)
Respiratory called

## 2014-02-13 NOTE — ED Notes (Signed)
Pt states that she has had nausea and a headache x 1 week. Hx of migraines. Neuro intact.

## 2014-02-13 NOTE — ED Notes (Signed)
Pt ambulated to restroom with steady gait.

## 2014-02-15 NOTE — ED Provider Notes (Signed)
Medical screening examination/treatment/procedure(s) were performed by non-physician practitioner and as supervising physician I was immediately available for consultation/collaboration.   EKG Interpretation None       Richarda Blade, MD 02/15/14 [phone]

## 2014-04-03 ENCOUNTER — Encounter (HOSPITAL_COMMUNITY): Payer: Self-pay | Admitting: Emergency Medicine

## 2014-04-03 ENCOUNTER — Emergency Department (HOSPITAL_COMMUNITY)
Admission: EM | Admit: 2014-04-03 | Discharge: 2014-04-03 | Disposition: A | Discharge status: Home / Self Care | Payer: Self-pay | Attending: Emergency Medicine | Admitting: Emergency Medicine

## 2014-04-03 ENCOUNTER — Emergency Department (HOSPITAL_COMMUNITY): Payer: Self-pay

## 2014-04-03 DIAGNOSIS — J45909 Unspecified asthma, uncomplicated: Secondary | ICD-10-CM

## 2014-04-03 DIAGNOSIS — Z79899 Other long term (current) drug therapy: Secondary | ICD-10-CM | POA: Insufficient documentation

## 2014-04-03 DIAGNOSIS — IMO0002 Reserved for concepts with insufficient information to code with codable children: Secondary | ICD-10-CM | POA: Insufficient documentation

## 2014-04-03 DIAGNOSIS — J45901 Unspecified asthma with (acute) exacerbation: Secondary | ICD-10-CM | POA: Insufficient documentation

## 2014-04-03 DIAGNOSIS — Z8673 Personal history of transient ischemic attack (TIA), and cerebral infarction without residual deficits: Secondary | ICD-10-CM | POA: Insufficient documentation

## 2014-04-03 DIAGNOSIS — Z8659 Personal history of other mental and behavioral disorders: Secondary | ICD-10-CM | POA: Insufficient documentation

## 2014-04-03 DIAGNOSIS — J309 Allergic rhinitis, unspecified: Secondary | ICD-10-CM

## 2014-04-03 DIAGNOSIS — R519 Headache, unspecified: Secondary | ICD-10-CM

## 2014-04-03 DIAGNOSIS — Z862 Personal history of diseases of the blood and blood-forming organs and certain disorders involving the immune mechanism: Secondary | ICD-10-CM | POA: Insufficient documentation

## 2014-04-03 DIAGNOSIS — Z880 Allergy status to penicillin: Secondary | ICD-10-CM | POA: Insufficient documentation

## 2014-04-03 DIAGNOSIS — R51 Headache: Secondary | ICD-10-CM

## 2014-04-03 DIAGNOSIS — Z859 Personal history of malignant neoplasm, unspecified: Secondary | ICD-10-CM | POA: Insufficient documentation

## 2014-04-03 DIAGNOSIS — G43909 Migraine, unspecified, not intractable, without status migrainosus: Secondary | ICD-10-CM | POA: Insufficient documentation

## 2014-04-03 DIAGNOSIS — I10 Essential (primary) hypertension: Secondary | ICD-10-CM | POA: Insufficient documentation

## 2014-04-03 DIAGNOSIS — M129 Arthropathy, unspecified: Secondary | ICD-10-CM | POA: Insufficient documentation

## 2014-04-03 DIAGNOSIS — F17200 Nicotine dependence, unspecified, uncomplicated: Secondary | ICD-10-CM | POA: Insufficient documentation

## 2014-04-03 MED ORDER — IPRATROPIUM BROMIDE 0.02 % IN SOLN
0.5000 mg | Freq: Once | RESPIRATORY_TRACT | Status: AC
  Administered 2014-04-03: 0.5 mg via RESPIRATORY_TRACT
  Filled 2014-04-03: qty 2.5

## 2014-04-03 MED ORDER — DIPHENHYDRAMINE HCL 50 MG/ML IJ SOLN
25.0000 mg | Freq: Once | INTRAMUSCULAR | Status: AC
  Administered 2014-04-03: 25 mg via INTRAMUSCULAR
  Filled 2014-04-03: qty 1

## 2014-04-03 MED ORDER — KETOROLAC TROMETHAMINE 30 MG/ML IJ SOLN
60.0000 mg | Freq: Once | INTRAMUSCULAR | Status: AC
  Administered 2014-04-03: 60 mg via INTRAMUSCULAR
  Filled 2014-04-03: qty 2

## 2014-04-03 MED ORDER — METOCLOPRAMIDE HCL 5 MG/ML IJ SOLN
10.0000 mg | Freq: Once | INTRAMUSCULAR | Status: AC
  Administered 2014-04-03: 10 mg via INTRAMUSCULAR
  Filled 2014-04-03: qty 2

## 2014-04-03 MED ORDER — DEXAMETHASONE SODIUM PHOSPHATE 10 MG/ML IJ SOLN
10.0000 mg | Freq: Once | INTRAMUSCULAR | Status: AC
  Administered 2014-04-03: 10 mg via INTRAMUSCULAR
  Filled 2014-04-03: qty 1

## 2014-04-03 MED ORDER — ALBUTEROL SULFATE HFA 108 (90 BASE) MCG/ACT IN AERS
2.0000 | INHALATION_SPRAY | RESPIRATORY_TRACT | Status: DC | PRN
  Filled 2014-04-03: qty 6.7

## 2014-04-03 MED ORDER — ALBUTEROL SULFATE (2.5 MG/3ML) 0.083% IN NEBU
5.0000 mg | INHALATION_SOLUTION | Freq: Once | RESPIRATORY_TRACT | Status: AC
  Administered 2014-04-03: 5 mg via RESPIRATORY_TRACT
  Filled 2014-04-03: qty 6

## 2014-04-03 NOTE — ED Provider Notes (Signed)
CSN: [id number]     Arrival date & time 04/03/14  1509 History  This chart was scribed for non-physician practitioner, Glendell Docker, NP, working with Virgel Manifold, MD by Ladene Artist, ED Scribe. This patient was seen in room WTR8/WTR8 and the patient's care was started at 3:29 PM.    Chief Complaint  Patient presents with  . URI  . Migraine    The history is provided by the patient. No language interpreter was used.   HPI Comments: Wendy Hoffman is a 48 y.o. female who presents to the Emergency Department complaining of cold-like symptoms onset 4 days ago. She reports associated congestion, sneezing, migraine, wheezing and intermittent fever. Tmax 102 F, ED temperature 98.4 F. Pt has tried NyQuil, DayQuil, throat lozenges, Afrin and inhaler with mild relief. She last used her inhaler approximately 8 AM. Pt is a nonsmoker.    Past Medical History  Diagnosis Date  . Asthma   . Thyroid disease   . Hypertension   . High cholesterol   . Migraine   . Morbid obesity   . H/O: hysterectomy   . Medical history non-contributory   . Hypothyroidism   . Anxiety   . Shortness of breath   . Stroke   . Arthritis   . Anemia   . Cancer    Past Surgical History  Procedure Laterality Date  . Cesarean section      3 occasions  . Thyroidectomy, partial      Goiter  . Cholecystectomy    . Abdominal hysterectomy     Family History  Problem Relation Age of Onset  . Migraines Sister    History  Substance Use Topics  . Smoking status: Current Every Day Smoker    Types: Cigarettes  . Smokeless tobacco: Not on file  . Alcohol Use: No   OB History   Grav Para Term Preterm Abortions TAB SAB Ect Mult Living                 Review of Systems  Constitutional: Positive for fever.  HENT: Positive for congestion and sneezing.   Respiratory: Positive for cough and wheezing.   Neurological: Positive for headaches.  All other systems reviewed and are negative.   Allergies  Mango  flavor; Aspirin; Penicillins; Blueberry; and Penicillins  Home Medications   Prior to Admission medications   Medication Sig Start Date End Date Taking? Authorizing Provider  albuterol (PROVENTIL HFA;VENTOLIN HFA) 108 (90 BASE) MCG/ACT inhaler Inhale 1 puff into the lungs every 6 (six) hours as needed for wheezing or shortness of breath. 11/05/13   Erline Hau, MD  albuterol (PROVENTIL) (2.5 MG/3ML) 0.083% nebulizer solution Take 3 mLs (2.5 mg total) by nebulization every 4 (four) hours as needed for wheezing or shortness of breath. 01/07/14   Orlie Dakin, MD  aspirin-acetaminophen-caffeine (EXCEDRIN MIGRAINE) [id number] MG per tablet Take 1 tablet by mouth every 6 (six) hours as needed for headache or migraine.    Historical Provider, MD  fluticasone (FLOVENT HFA) 220 MCG/ACT inhaler Inhale 2 puffs into the lungs 2 (two) times daily. 11/05/13   Erline Hau, MD  hydrochlorothiazide (HYDRODIURIL) 25 MG tablet Take 1 tablet (25 mg total) by mouth daily. 11/16/13   Reyne Dumas, MD  HYDROcodone-acetaminophen (NORCO/VICODIN) 5-325 MG per tablet Take 1-2 tabs every 4-6 hours as needed for pain. 02/13/14   Noland Fordyce, PA-C  naproxen sodium (ANAPROX) 220 MG tablet Take 440 mg by mouth 2 (two) times daily as needed (  pain).    Historical Provider, MD  promethazine (PHENERGAN) 25 MG tablet Take 1 tablet (25 mg total) by mouth every 6 (six) hours as needed for nausea or vomiting. 02/13/14   Noland Fordyce, PA-C   Triage Vitals: BP 153/110  Pulse 99  Temp(Src) 98.4 F (36.9 C) (Oral)  Resp 18  SpO2 98% Physical Exam  Nursing note and vitals reviewed. Constitutional: She is oriented to person, place, and time. She appears well-developed and well-nourished.  HENT:  Head: Normocephalic and atraumatic.  Right Ear: External ear normal.  Left Ear: External ear normal.  Nose: Rhinorrhea present.  Mouth/Throat: Posterior oropharyngeal erythema present.  Eyes: Conjunctivae and EOM  are normal. Pupils are equal, round, and reactive to light.  Neck: Neck supple.  Cardiovascular: Normal rate.   Pulmonary/Chest: She has wheezes.  Abdominal: Soft. Bowel sounds are normal. There is no tenderness.  Musculoskeletal: Normal range of motion.  Neurological: She is alert and oriented to person, place, and time.  Skin: Skin is warm and dry.  Psychiatric: She has a normal mood and affect. Her behavior is normal.    ED Course  Procedures (including critical care time) DIAGNOSTIC STUDIES: Oxygen Saturation is 98% on RA, normal by my interpretation.    COORDINATION OF CARE: 3:31 PM-Discussed treatment plan which includes breathing treatment and CXR with pt at bedside and pt agreed to plan.   Labs Review Labs Reviewed - No data to display  Imaging Review Dg Chest 2 View  04/03/2014   CLINICAL DATA:  Cough and fever for 4 days.  EXAM: CHEST  2 VIEW  COMPARISON:  12/19/2013  FINDINGS: Cardiac silhouette is upper limits of normal in size. The lungs are mildly hypoinflated, unchanged. There is no evidence of airspace consolidation, edema, pleural effusion, or pneumothorax. No acute osseous abnormality is seen.  IMPRESSION: No active cardiopulmonary disease.   Electronically Signed   By: Logan Bores   On: 04/03/2014 16:46     EKG Interpretation None      MDM   Final diagnoses:  Headache  Asthma  Allergic rhinitis   Pt doing better after the treatment. Pt is okay to follow up with pcp as needed. Will send home with inhaler. Headache similar to previous migraine.feeling better  I personally performed the services described in this documentation, which was scribed in my presence. The recorded information has been reviewed and is accurate.    Glendell Docker, NP 04/03/14 [phone]

## 2014-04-03 NOTE — ED Provider Notes (Signed)
Medical screening examination/treatment/procedure(s) were performed by non-physician practitioner and as supervising physician I was immediately available for consultation/collaboration.   EKG Interpretation None       Virgel Manifold, MD 04/03/14 [phone]

## 2014-04-03 NOTE — Discharge Instructions (Signed)
Allergic Rhinitis Allergic rhinitis is when the mucous membranes in the nose respond to allergens. Allergens are particles in the air that cause your body to have an allergic reaction. This causes you to release allergic antibodies. Through a chain of events, these eventually cause you to release histamine into the blood stream. Although meant to protect the body, it is this release of histamine that causes your discomfort, such as frequent sneezing, congestion, and an itchy, runny nose.  CAUSES  Seasonal allergic rhinitis (hay fever) is caused by pollen allergens that may come from grasses, trees, and weeds. Year-round allergic rhinitis (perennial allergic rhinitis) is caused by allergens such as house dust mites, pet dander, and mold spores.  SYMPTOMS   Nasal stuffiness (congestion).  Itchy, runny nose with sneezing and tearing of the eyes. DIAGNOSIS  Your health care provider can help you determine the allergen or allergens that trigger your symptoms. If you and your health care provider are unable to determine the allergen, skin or blood testing may be used. TREATMENT  Allergic Rhinitis does not have a cure, but it can be controlled by:  Medicines and allergy shots (immunotherapy).  Avoiding the allergen. Hay fever may often be treated with antihistamines in pill or nasal spray forms. Antihistamines block the effects of histamine. There are over-the-counter medicines that may help with nasal congestion and swelling around the eyes. Check with your health care provider before taking or giving this medicine.  If avoiding the allergen or the medicine prescribed do not work, there are many new medicines your health care provider can prescribe. Stronger medicine may be used if initial measures are ineffective. Desensitizing injections can be used if medicine and avoidance does not work. Desensitization is when a patient is given ongoing shots until the body becomes less sensitive to the allergen.  Make sure you follow up with your health care provider if problems continue. HOME CARE INSTRUCTIONS It is not possible to completely avoid allergens, but you can reduce your symptoms by taking steps to limit your exposure to them. It helps to know exactly what you are allergic to so that you can avoid your specific triggers. SEEK MEDICAL CARE IF:   You have a fever.  You develop a cough that does not stop easily (persistent).  You have shortness of breath.  You start wheezing.  Symptoms interfere with normal daily activities. Document Released: 08/24/2001 Document Revised: 09/19/2013 Document Reviewed: 08/06/2013 [facility] Patient Information 2014 [place].  Asthma, Adult Asthma is a condition of the lungs in which the airways tighten and narrow. Asthma can make it hard to breathe. Asthma cannot be cured, but medicine and lifestyle changes can help control it. Asthma may be started (triggered) by:  Animal skin flakes (dander).  Dust.  Cockroaches.  Pollen.  Mold.  Smoke.  Cleaning products.  Hair sprays or aerosol sprays.  Paint fumes or strong smells.  Cold air, weather changes, and winds.  Crying or laughing hard.  Stress.  Certain medicines or drugs.  Foods, such as dried fruit, potato chips, and sparkling grape juice.  Infections or conditions (colds, flu).  Exercise.  Certain medical conditions or diseases.  Exercise or tiring activities. HOME CARE   Take medicine as told by your doctor.  Use a peak flow meter as told by your doctor. A peak flow meter is a tool that measures how well the lungs are working.  Record and keep track of the peak flow meter's readings.  Understand and use the asthma action  plan. An asthma action plan is a written plan for taking care of your asthma and treating your attacks.  To help prevent asthma attacks:  Do not smoke. Stay away from secondhand smoke.  Change your heating and air conditioning filter  often.  Limit your use of fireplaces and wood stoves.  Get rid of pests (such as roaches and mice) and their droppings.  Throw away plants if you see mold on them.  Clean your floors. Dust regularly. Use cleaning products that do not smell.  Have someone vacuum when you are not home. Use a vacuum cleaner with a HEPA filter if possible.  Replace carpet with wood, tile, or vinyl flooring. Carpet can trap animal skin flakes and dust.  Use allergy-proof pillows, mattress covers, and box spring covers.  Wash bed sheets and blankets every week in hot water and dry them in a dryer.  Use blankets that are made of polyester or cotton.  Clean bathrooms and kitchens with bleach. If possible, have someone repaint the walls in these rooms with mold-resistant paint. Keep out of the rooms that are being cleaned and painted.  Wash hands often. GET HELP IF:  You have make a whistling sound when breaking (wheeze), have shortness of breath, or have a cough even if taking medicine to prevent attacks.  The colored mucus you cough up (sputum) is thicker than usual.  The colored mucus you cough up changes from clear or white to yellow, green, gray, or bloody.  You have problems from the medicine you are taking such as:  A rash.  Itching.  Swelling.  Trouble breathing.  You need reliever medicines more than 2 3 times a week.  Your peak flow measurement is still at 50 79% of your personal best after following the action plan for 1 hour. GET HELP RIGHT AWAY IF:   You seem to be worse and are not responding to medicine during an asthma attack.  You are short of breath even at rest.  You get short of breath when doing very little activity.  You have trouble eating, drinking, or talking.  You have chest pain.  You have a fast heartbeat.  Your lips or fingernails start to turn blue.  You are lightheaded, dizzy, or faint.  Your peak flow is less than 50% of your personal best.  You  have a fever or lasting symptoms for more than 2 3 days.  You have a fever and your symptoms suddenly get worse. MAKE SURE YOU:   Understand these instructions.  Will watch your condition.  Will get help right away if you are not doing well or get worse. Document Released: 05/17/2008 Document Revised: 09/19/2013 Document Reviewed: 06/28/2013 [facility] Patient Information 2014 [place], [place].

## 2014-04-03 NOTE — ED Notes (Signed)
Pt c/o nasal congestion, fever, cough, and sneezing since Saturday. Pt also c/o migraine and sts that "my asthma is acting up." A&Ox4. Pt sts she has taken dayquil and nyquil with no relief.

## 2014-04-22 ENCOUNTER — Emergency Department (HOSPITAL_COMMUNITY): Payer: Self-pay

## 2014-04-22 ENCOUNTER — Encounter (HOSPITAL_COMMUNITY): Payer: Self-pay | Admitting: Emergency Medicine

## 2014-04-22 ENCOUNTER — Inpatient Hospital Stay (HOSPITAL_COMMUNITY)
Admission: EM | Admit: 2014-04-22 | Discharge: 2014-04-25 | DRG: 202 | Disposition: A | Discharge status: Home / Self Care | Payer: Self-pay | Attending: Internal Medicine | Admitting: Internal Medicine

## 2014-04-22 DIAGNOSIS — J45901 Unspecified asthma with (acute) exacerbation: Secondary | ICD-10-CM

## 2014-04-22 DIAGNOSIS — E039 Hypothyroidism, unspecified: Secondary | ICD-10-CM | POA: Diagnosis present

## 2014-04-22 DIAGNOSIS — I10 Essential (primary) hypertension: Secondary | ICD-10-CM | POA: Diagnosis present

## 2014-04-22 DIAGNOSIS — Z720 Tobacco use: Secondary | ICD-10-CM | POA: Diagnosis present

## 2014-04-22 DIAGNOSIS — G43909 Migraine, unspecified, not intractable, without status migrainosus: Secondary | ICD-10-CM | POA: Diagnosis present

## 2014-04-22 DIAGNOSIS — Z8673 Personal history of transient ischemic attack (TIA), and cerebral infarction without residual deficits: Secondary | ICD-10-CM

## 2014-04-22 DIAGNOSIS — Z6841 Body Mass Index (BMI) 40.0 and over, adult: Secondary | ICD-10-CM

## 2014-04-22 DIAGNOSIS — M129 Arthropathy, unspecified: Secondary | ICD-10-CM | POA: Diagnosis present

## 2014-04-22 DIAGNOSIS — F411 Generalized anxiety disorder: Secondary | ICD-10-CM | POA: Diagnosis present

## 2014-04-22 DIAGNOSIS — E7800 Pure hypercholesterolemia, unspecified: Secondary | ICD-10-CM | POA: Diagnosis present

## 2014-04-22 DIAGNOSIS — F17200 Nicotine dependence, unspecified, uncomplicated: Secondary | ICD-10-CM | POA: Diagnosis present

## 2014-04-22 LAB — BASIC METABOLIC PANEL
BUN: 8 mg/dL (ref 6–23)
CHLORIDE: 102 meq/L (ref 96–112)
CO2: 29 meq/L (ref 19–32)
Calcium: 9.2 mg/dL (ref 8.4–10.5)
Creatinine, Ser: 0.65 mg/dL (ref 0.50–1.10)
GFR calc Af Amer: >90 mL/min (ref >90)
GFR calc non Af Amer: >90 mL/min (ref >90)
Glucose, Bld: 105 mg/dL — ABNORMAL HIGH (ref 70–99)
Potassium: 3.7 mEq/L (ref 3.7–5.3)
SODIUM: 142 meq/L (ref 137–147)

## 2014-04-22 LAB — CBC
HCT: 39.1 % (ref 36.0–46.0)
HEMOGLOBIN: 13.1 g/dL (ref 12.0–15.0)
MCH: 30.3 pg (ref 26.0–34.0)
MCHC: 33.5 g/dL (ref 30.0–36.0)
MCV: 90.5 fL (ref 78.0–100.0)
PLATELETS: 339 10*3/uL (ref 150–400)
RBC: 4.32 MIL/uL (ref 3.87–5.11)
RDW: 12.7 % (ref 11.5–15.5)
WBC: 5.5 10*3/uL (ref 4.0–10.5)

## 2014-04-22 MED ORDER — BUDESONIDE 0.25 MG/2ML IN SUSP
0.2500 mg | Freq: Two times a day (BID) | RESPIRATORY_TRACT | Status: DC
  Administered 2014-04-23 – 2014-04-25 (×5): 0.25 mg via RESPIRATORY_TRACT
  Filled 2014-04-22 (×12): qty 2

## 2014-04-22 MED ORDER — ASPIRIN-ACETAMINOPHEN-CAFFEINE 250-250-65 MG PO TABS
4.0000 | ORAL_TABLET | Freq: Four times a day (QID) | ORAL | Status: DC | PRN
  Filled 2014-04-22: qty 4

## 2014-04-22 MED ORDER — METHYLPREDNISOLONE SODIUM SUCC 125 MG IJ SOLR
125.0000 mg | Freq: Once | INTRAMUSCULAR | Status: AC
  Administered 2014-04-22: 125 mg via INTRAVENOUS
  Filled 2014-04-22: qty 2

## 2014-04-22 MED ORDER — HYDROCHLOROTHIAZIDE 25 MG PO TABS
25.0000 mg | ORAL_TABLET | Freq: Every day | ORAL | Status: DC
  Administered 2014-04-22 – 2014-04-24 (×3): 25 mg via ORAL
  Filled 2014-04-22 (×4): qty 1

## 2014-04-22 MED ORDER — MONTELUKAST SODIUM 10 MG PO TABS
10.0000 mg | ORAL_TABLET | Freq: Every day | ORAL | Status: DC
  Administered 2014-04-22 – 2014-04-24 (×3): 10 mg via ORAL
  Filled 2014-04-22 (×4): qty 1

## 2014-04-22 MED ORDER — BUDESONIDE 0.25 MG/2ML IN SUSP
0.2500 mg | RESPIRATORY_TRACT | Status: AC
  Administered 2014-04-22: 0.25 mg via RESPIRATORY_TRACT
  Filled 2014-04-22: qty 2

## 2014-04-22 MED ORDER — ALBUTEROL (5 MG/ML) CONTINUOUS INHALATION SOLN
10.0000 mg/h | INHALATION_SOLUTION | Freq: Once | RESPIRATORY_TRACT | Status: AC
  Administered 2014-04-22: 10 mg/h via RESPIRATORY_TRACT
  Filled 2014-04-22: qty 20

## 2014-04-22 MED ORDER — MAGNESIUM SULFATE 50 % IJ SOLN: 1.0000 g | Freq: Once | INTRAMUSCULAR | Status: DC

## 2014-04-22 MED ORDER — METHYLPREDNISOLONE SODIUM SUCC 125 MG IJ SOLR
60.0000 mg | Freq: Three times a day (TID) | INTRAMUSCULAR | Status: DC
  Administered 2014-04-22 – 2014-04-24 (×5): 60 mg via INTRAVENOUS
  Filled 2014-04-22 (×9): qty 0.96

## 2014-04-22 MED ORDER — ACETAMINOPHEN-CODEINE #4 300-60 MG PO TABS
2.0000 | ORAL_TABLET | Freq: Four times a day (QID) | ORAL | Status: DC | PRN
  Administered 2014-04-22 – 2014-04-25 (×5): 2 via ORAL
  Filled 2014-04-22 (×5): qty 2

## 2014-04-22 MED ORDER — ALBUTEROL SULFATE (2.5 MG/3ML) 0.083% IN NEBU: 5.0000 mg | INHALATION_SOLUTION | RESPIRATORY_TRACT | Status: DC | PRN

## 2014-04-22 MED ORDER — BUDESONIDE 0.25 MG/2ML IN SUSP: 0.2500 mg | Freq: Two times a day (BID) | RESPIRATORY_TRACT | Status: DC

## 2014-04-22 MED ORDER — IPRATROPIUM-ALBUTEROL 0.5-2.5 (3) MG/3ML IN SOLN
3.0000 mL | RESPIRATORY_TRACT | Status: AC
  Administered 2014-04-22: 3 mL via RESPIRATORY_TRACT
  Filled 2014-04-22: qty 3

## 2014-04-22 MED ORDER — ALBUTEROL (5 MG/ML) CONTINUOUS INHALATION SOLN
10.0000 mg/h | INHALATION_SOLUTION | Freq: Once | RESPIRATORY_TRACT | Status: AC
  Administered 2014-04-22: 10 mg/h via RESPIRATORY_TRACT

## 2014-04-22 MED ORDER — IPRATROPIUM-ALBUTEROL 0.5-2.5 (3) MG/3ML IN SOLN
3.0000 mL | RESPIRATORY_TRACT | Status: DC
  Administered 2014-04-22 – 2014-04-24 (×13): 3 mL via RESPIRATORY_TRACT
  Filled 2014-04-22 (×13): qty 3

## 2014-04-22 MED ORDER — MAGNESIUM SULFATE IN D5W 10-5 MG/ML-% IV SOLN
1.0000 g | Freq: Once | INTRAVENOUS | Status: AC
  Administered 2014-04-22: 1 g via INTRAVENOUS
  Filled 2014-04-22: qty 100

## 2014-04-22 MED ORDER — ALBUTEROL SULFATE (2.5 MG/3ML) 0.083% IN NEBU: 2.5000 mg | INHALATION_SOLUTION | RESPIRATORY_TRACT | Status: DC | PRN

## 2014-04-22 MED ORDER — ENOXAPARIN SODIUM 40 MG/0.4ML ~~LOC~~ SOLN
40.0000 mg | SUBCUTANEOUS | Status: DC
  Administered 2014-04-22 – 2014-04-24 (×3): 40 mg via SUBCUTANEOUS
  Filled 2014-04-22 (×4): qty 0.4

## 2014-04-22 MED ORDER — ACETAMINOPHEN 325 MG PO TABS: 650.0000 mg | ORAL_TABLET | Freq: Four times a day (QID) | ORAL | Status: DC | PRN

## 2014-04-22 MED ORDER — IPRATROPIUM BROMIDE 0.02 % IN SOLN
0.5000 mg | Freq: Once | RESPIRATORY_TRACT | Status: AC
  Administered 2014-04-22: 0.5 mg via RESPIRATORY_TRACT
  Filled 2014-04-22: qty 2.5

## 2014-04-22 MED ORDER — FLUTICASONE PROPIONATE HFA 220 MCG/ACT IN AERO
2.0000 | INHALATION_SPRAY | Freq: Two times a day (BID) | RESPIRATORY_TRACT | Status: DC
  Filled 2014-04-22: qty 12

## 2014-04-22 MED ORDER — ACETAMINOPHEN 650 MG RE SUPP: 650.0000 mg | Freq: Four times a day (QID) | RECTAL | Status: DC | PRN

## 2014-04-22 MED ORDER — HYDROCODONE-ACETAMINOPHEN 5-325 MG PO TABS
1.0000 | ORAL_TABLET | ORAL | Status: DC | PRN
  Administered 2014-04-22 – 2014-04-25 (×4): 1 via ORAL
  Filled 2014-04-22 (×4): qty 1

## 2014-04-22 NOTE — ED Notes (Signed)
Per pt, having asthmatic attack.  Having shortness of breath on assessment.  Unable to form full sentences.  Nebulizer last this am with inhaler.  No relief.  Has felt this way for a couple of days.  Notified Respiratory.

## 2014-04-22 NOTE — Progress Notes (Signed)
[facility] provided pt with a list of primary care resources and a [facility] Orange Card application to help patient establish primary care.  °

## 2014-04-22 NOTE — H&P (Addendum)
[facility] History and Physical  Wendy Hoffman YNW:[id number] DOB: [birth year] DOA: 04/22/2014  Referring physician: Dorie Rank PCP: No primary provider on file.    Chief Complaint: shortness of breath  HPI: Wendy Hoffman is a 48 y.o. female with asthma and other PMH as below who has been short of breath for 2 wks now. She initially had a cold with mild fever, cough and congestion. The fever, and congestion resolved but she has become progressively more short of breath with and uncontrolled dry cough. She has been wheezing extensively and despite using her Nebulizers 3 x a day and her Flovent, she has not improved. She is being admitted for an asthma flare.   General: The patient denies anorexia, fever, weight loss- She has had a Migraine headache for 2 days now Cardiac: Denies syncope, palpitations, pedal edema  Respiratory: Per HPI GI: Denies severe  indigestion/heartburn, abdominal pain, nausea, vomiting, diarrhea  GU: Denies hematuria, incontinence, dysuria  Musculoskeletal: has mid back pain worse with her cough Skin: Denies suspicious skin lesions Neurologic: Denies focal weakness or numbness, change in vision  Past Medical History  Diagnosis Date  . Asthma   . Thyroid disease   . Hypertension   . High cholesterol   . Migraine   . Morbid obesity   . H/O: hysterectomy   . Medical history non-contributory   . Hypothyroidism   . Anxiety   . Shortness of breath   . Stroke   . Arthritis   . Anemia   . Cancer    Past Surgical History  Procedure Laterality Date  . Cesarean section      3 occasions  . Thyroidectomy, partial      Goiter  . Cholecystectomy    . Abdominal hysterectomy     Social History:  reports that she has been smoking Cigarettes.  She has been smoking about 0.00 packs per day. She has never used smokeless tobacco. She reports that she does not drink alcohol or use illicit drugs. Lives at home Good with ADLs  Allergies  Allergen Reactions   . Mango Flavor Shortness Of Breath  . Aspirin Nausea And Vomiting  . Blueberry [Vaccinium Angustifolium] Rash  . Penicillins Rash    Family History  Problem Relation Age of Onset  . Migraines Sister     Prior to Admission medications   Medication Sig Start Date End Date Taking? Authorizing Provider  albuterol (PROVENTIL HFA;VENTOLIN HFA) 108 (90 BASE) MCG/ACT inhaler Inhale 1 puff into the lungs every 6 (six) hours as needed for wheezing or shortness of breath. 11/05/13  Yes Erline Hau, MD  albuterol (PROVENTIL) (2.5 MG/3ML) 0.083% nebulizer solution Take 3 mLs (2.5 mg total) by nebulization every 4 (four) hours as needed for wheezing or shortness of breath. 01/07/14  Yes Orlie Dakin, MD  aspirin-acetaminophen-caffeine ([place]) [phone] MG per tablet Take 4 tablets by mouth every 6 (six) hours as needed for headache or migraine.    Yes Historical Provider, MD  fluticasone (FLOVENT HFA) 220 MCG/ACT inhaler Inhale 2 puffs into the lungs 2 (two) times daily. 11/05/13  Yes Erline Hau, MD  hydrochlorothiazide (HYDRODIURIL) 25 MG tablet Take 25 mg by mouth at bedtime.   Yes Historical Provider, MD  HYDROcodone-acetaminophen (NORCO/VICODIN) 5-325 MG per tablet Take 1-2 tabs every 4-6 hours as needed for pain. 02/13/14  Yes Noland Fordyce, PA-C  naproxen sodium (ANAPROX) 220 MG tablet Take 880 mg by mouth 2 (two) times daily as needed (For pain.).  Yes Historical Provider, MD     Physical Exam: Filed Vitals:   04/22/14 1257 04/22/14 1259 04/22/14 1324 04/22/14 1535  BP: 173/113     Pulse: 98     Temp:  98.7 F (37.1 C)    TempSrc: Oral     Resp: 30     SpO2: 100%  98% 98%      General: AAo x 3, mild distress with continued cough HEENT: Normocephalic and Atraumatic, Mucous membranes pink                PERRLA; EOM intact; No scleral icterus,                 Nares: Patent, Oropharynx: Clear, Fair Dentition                 Neck: FROM, no  cervical lymphadenopathy, thyromegaly, carotid bruit or JVD;  Breasts: deferred CHEST WALL: No tenderness  CHEST: b/l expiratory wheeze HEART: Regular rate and rhythm; no murmurs rubs or gallops  BACK: No kyphosis or scoliosis; no CVA tenderness  ABDOMEN: Positive Bowel Sounds, soft, non-tender; no masses, no organomegaly Rectal Exam: deferred EXTREMITIES: No cyanosis, clubbing, or edema Genitalia: not examined  SKIN:  no rash or ulceration  CNS: Alert and Oriented x 4, Nonfocal exam, CN 2-12 intact  Labs on Admission:  Basic Metabolic Panel:  Recent Labs Lab 04/22/14 1340  NA 142  K 3.7  CL 102  CO2 29  GLUCOSE 105*  BUN 8  CREATININE 0.65  CALCIUM 9.2   Liver Function Tests: No results found for this basename: AST, ALT, ALKPHOS, BILITOT, PROT, ALBUMIN,  in the last 168 hours No results found for this basename: LIPASE, AMYLASE,  in the last 168 hours No results found for this basename: AMMONIA,  in the last 168 hours CBC:  Recent Labs Lab 04/22/14 1340  WBC 5.5  HGB 13.1  HCT 39.1  MCV 90.5  PLT 339   Cardiac Enzymes: No results found for this basename: CKTOTAL, CKMB, CKMBINDEX, TROPONINI,  in the last 168 hours  BNP (last 3 results) No results found for this basename: PROBNP,  in the last 8760 hours CBG: No results found for this basename: GLUCAP,  in the last 168 hours  Radiological Exams on Admission: Dg Chest 2 View  04/22/2014   CLINICAL DATA:  Shortness of breath and coughing with history of asthma and long-term tobacco use.  EXAM: CHEST  2 VIEW  COMPARISON:  DG CHEST 2 VIEW dated 04/03/2014  FINDINGS: The knee lungs are borderline hypoinflated. There is no focal infiltrate. The interstitial markings are mildly prominent bilaterally but are stable. There is no pleural effusion or pneumothorax. The mediastinum is normal in width. The cardiopericardial silhouette is normal in size. The pulmonary vascularity is not engorged. The observed portions of the bony  thorax exhibit no acute abnormalities.  IMPRESSION: There is no evidence of pneumonia nor CHF. Coarse interstitial lung markings are not new and likely reflect the patient's smoking history.   Electronically Signed   By: David  Martinique   On: 04/22/2014 14:38    EKG: not ordered  Assessment/Plan Active Problems:   Asthma exacerbation - start Duonebs, Pulmicort nebs, IV Solumedrol and Singulair.  - she needs another stat Duo-neb and Pulmicort treatment - continuous pulse ox - have asked RN to watch closely for decompensation over night  HTN - cont Home medication  Migraine Headache - she states Tylenol with Codeine has helped in the past- will  order this as PRN    Consulted: none  Code Status: Full code  Family Communication: husband in room  Disposition Plan: likely home in 1-2 day   Time spent: >45 min  Debbe Odea, MD [facility]  If 7PM-7AM, please contact night-coverage [email or web address] 04/22/2014, 4:58 PM

## 2014-04-22 NOTE — ED Provider Notes (Signed)
CSN: [id number]     Arrival date & time 04/22/14  1255 History   First MD Initiated Contact with Patient 04/22/14 1303     Chief Complaint  Patient presents with  . Asthma   Patient is a 48 y.o. female presenting with wheezing. The history is provided by the patient.  Wheezing Severity:  Severe Onset quality:  Gradual Duration:  1 week Timing:  Constant Progression:  Worsening Chronicity:  Recurrent Context comment:  Her triggers are recent URI.  No allergy triggers. Relieved by:  Nothing Worsened by:  Activity Ineffective treatments:  Beta-agonist inhaler (Pt did run out of her inhaler in the last day or so.) Associated symptoms: shortness of breath   Associated symptoms: no chest pain, no rash, no sore throat and no sputum production     Past Medical History  Diagnosis Date  . Asthma   . Thyroid disease   . Hypertension   . High cholesterol   . Migraine   . Morbid obesity   . H/O: hysterectomy   . Medical history non-contributory   . Hypothyroidism   . Anxiety   . Shortness of breath   . Stroke   . Arthritis   . Anemia   . Cancer    Past Surgical History  Procedure Laterality Date  . Cesarean section      3 occasions  . Thyroidectomy, partial      Goiter  . Cholecystectomy    . Abdominal hysterectomy     Family History  Problem Relation Age of Onset  . Migraines Sister    History  Substance Use Topics  . Smoking status: Current Every Day Smoker    Types: Cigarettes  . Smokeless tobacco: Not on file  . Alcohol Use: No   OB History   Grav Para Term Preterm Abortions TAB SAB Ect Mult Living                 Review of Systems  HENT: Negative for sore throat.   Respiratory: Positive for shortness of breath and wheezing. Negative for sputum production.   Cardiovascular: Negative for chest pain.  Skin: Negative for rash.  All other systems reviewed and are negative.     Allergies  Mango flavor; Aspirin; Blueberry; and Penicillins  Home  Medications   Prior to Admission medications   Medication Sig Start Date End Date Taking? Authorizing Provider  albuterol (PROVENTIL HFA;VENTOLIN HFA) 108 (90 BASE) MCG/ACT inhaler Inhale 1 puff into the lungs every 6 (six) hours as needed for wheezing or shortness of breath. 11/05/13  Yes Erline Hau, MD  albuterol (PROVENTIL) (2.5 MG/3ML) 0.083% nebulizer solution Take 3 mLs (2.5 mg total) by nebulization every 4 (four) hours as needed for wheezing or shortness of breath. 01/07/14  Yes Orlie Dakin, MD  aspirin-acetaminophen-caffeine ([place]) [phone] MG per tablet Take 4 tablets by mouth every 6 (six) hours as needed for headache or migraine.    Yes Historical Provider, MD  fluticasone (FLOVENT HFA) 220 MCG/ACT inhaler Inhale 2 puffs into the lungs 2 (two) times daily. 11/05/13  Yes Erline Hau, MD  hydrochlorothiazide (HYDRODIURIL) 25 MG tablet Take 25 mg by mouth at bedtime.   Yes Historical Provider, MD  HYDROcodone-acetaminophen (NORCO/VICODIN) 5-325 MG per tablet Take 1-2 tabs every 4-6 hours as needed for pain. 02/13/14  Yes Noland Fordyce, PA-C  naproxen sodium (ANAPROX) 220 MG tablet Take 880 mg by mouth 2 (two) times daily as needed (pain).    Yes  Historical Provider, MD  promethazine (PHENERGAN) 25 MG tablet Take 1 tablet (25 mg total) by mouth every 6 (six) hours as needed for nausea or vomiting. 02/13/14   Noland Fordyce, PA-C   BP 173/113  Pulse 98  Temp(Src) 98.7 F (37.1 C)  Resp 30  SpO2 98% Physical Exam  Nursing note and vitals reviewed. Constitutional: She appears well-developed and well-nourished. No distress.  HENT:  Head: Normocephalic and atraumatic.  Right Ear: External ear normal.  Left Ear: External ear normal.  Eyes: Conjunctivae are normal. Right eye exhibits no discharge. Left eye exhibits no discharge. No scleral icterus.  Neck: Neck supple. No tracheal deviation present.  Cardiovascular: Normal rate, regular rhythm and  intact distal pulses.   Pulmonary/Chest: Accessory muscle usage present. No stridor. Tachypnea noted. No respiratory distress. She has wheezes. She has no rales.  struggles to speak in full sentences  Abdominal: Soft. Bowel sounds are normal. She exhibits no distension. There is no tenderness. There is no rebound and no guarding.  Musculoskeletal: She exhibits no edema and no tenderness.  Neurological: She is alert. She has normal strength. No cranial nerve deficit (no facial droop, extraocular movements intact, no slurred speech) or sensory deficit. She exhibits normal muscle tone. She displays no seizure activity. Coordination normal.  Skin: Skin is warm and dry. No rash noted.  Psychiatric: She has a normal mood and affect.    ED Course  Procedures (including critical care time) Labs Review Labs Reviewed  BASIC METABOLIC PANEL - Abnormal; Notable for the following:    Glucose, Bld 105 (*)    All other components within normal limits  CBC    Imaging Review Dg Chest 2 View  04/22/2014   CLINICAL DATA:  Shortness of breath and coughing with history of asthma and long-term tobacco use.  EXAM: CHEST  2 VIEW  COMPARISON:  DG CHEST 2 VIEW dated 04/03/2014  FINDINGS: The knee lungs are borderline hypoinflated. There is no focal infiltrate. The interstitial markings are mildly prominent bilaterally but are stable. There is no pleural effusion or pneumothorax. The mediastinum is normal in width. The cardiopericardial silhouette is normal in size. The pulmonary vascularity is not engorged. The observed portions of the bony thorax exhibit no acute abnormalities.  IMPRESSION: There is no evidence of pneumonia nor CHF. Coarse interstitial lung markings are not new and likely reflect the patient's smoking history.   Electronically Signed   By: David  Martinique   On: 04/22/2014 14:38   Medications  albuterol (PROVENTIL,VENTOLIN) solution continuous neb (10 mg/hr Nebulization Given 04/22/14 1534)  magnesium  sulfate IVPB 1 g 100 mL (not administered)  methylPREDNISolone sodium succinate (SOLU-MEDROL) 125 mg/2 mL injection 125 mg (125 mg Intravenous Given 04/22/14 1343)  albuterol (PROVENTIL,VENTOLIN) solution continuous neb (10 mg/hr Nebulization Given 04/22/14 1323)  ipratropium (ATROVENT) nebulizer solution 0.5 mg (0.5 mg Nebulization Given 04/22/14 1323)   1500  Pt continues to have significant wheezing despite one hour nebulizer treatment and IV steroids.  Does not appear fatigued or somnolent.  MDM   Final diagnoses:  Asthma with acute exacerbation    Pt with persistent wheezing despite albuterol treatments and steroids.  Will admit to the hospital for continued treatment.    Kathalene Frames, MD 04/22/14 [id number]

## 2014-04-22 NOTE — Progress Notes (Signed)
  CARE MANAGEMENT ED NOTE 04/22/2014  Patient:  Wendy Hoffman, Wendy Hoffman   Account Number:  [id number]  Date Initiated:  04/22/2014  Documentation initiated by:  Jackelyn Poling  Subjective/Objective Assessment:   48 yr old self pay [employer] pt seen by [facility] staff for self pay resources referr to [facility]  staff note din EPIC     Subjective/Objective Assessment Detail:   having asthmatic attack.  Having shortness of breath on assessment.  Unable to form full sentences.  Nebulizer last this am with inhaler.  No relief.    CM consult for Patient has no insurance     Action/Plan:   Action/Plan Detail:   Anticipated DC Date:  04/25/2014     Status Recommendation to Physician:   Result of Recommendation:    Other ED Services  Consult [facility] [place] / [facility] (established/new)  Outpatient Services - Pt will follow up    Choice offered to / List presented to:            Status of service:  Completed, signed off  ED Comments:   ED Comments Detail:

## 2014-04-23 DIAGNOSIS — J45901 Unspecified asthma with (acute) exacerbation: Principal | ICD-10-CM

## 2014-04-23 LAB — BASIC METABOLIC PANEL
BUN: 9 mg/dL (ref 6–23)
CHLORIDE: 97 meq/L (ref 96–112)
CO2: 25 meq/L (ref 19–32)
Calcium: 9.6 mg/dL (ref 8.4–10.5)
Creatinine, Ser: 0.61 mg/dL (ref 0.50–1.10)
GFR calc Af Amer: >90 mL/min (ref >90)
GFR calc non Af Amer: >90 mL/min (ref >90)
GLUCOSE: 188 mg/dL — AB (ref 70–99)
POTASSIUM: 3.5 meq/L — AB (ref 3.7–5.3)
Sodium: 139 mEq/L (ref 137–147)

## 2014-04-23 LAB — CBC
HEMATOCRIT: 38.2 % (ref 36.0–46.0)
HEMOGLOBIN: 12.7 g/dL (ref 12.0–15.0)
MCH: 30.2 pg (ref 26.0–34.0)
MCHC: 33.2 g/dL (ref 30.0–36.0)
MCV: 91 fL (ref 78.0–100.0)
Platelets: 318 10*3/uL (ref 150–400)
RBC: 4.2 MIL/uL (ref 3.87–5.11)
RDW: 12.7 % (ref 11.5–15.5)
WBC: 7.1 10*3/uL (ref 4.0–10.5)

## 2014-04-23 MED ORDER — POTASSIUM CHLORIDE CRYS ER 20 MEQ PO TBCR
40.0000 meq | EXTENDED_RELEASE_TABLET | Freq: Once | ORAL | Status: AC
  Administered 2014-04-23: 40 meq via ORAL
  Filled 2014-04-23: qty 2

## 2014-04-23 MED ORDER — TRAZODONE HCL 50 MG PO TABS
25.0000 mg | ORAL_TABLET | Freq: Every evening | ORAL | Status: DC | PRN
  Administered 2014-04-23: 25 mg via ORAL
  Filled 2014-04-23: qty 1

## 2014-04-23 NOTE — Progress Notes (Signed)
CSW received referral that pt has no insurance.   CSW reviewed chart and noted that [facility] met with pt and pt reports that she recently applied for the [employer] and that pt is employed.  No further social work needs identified at this time as no insurance was addressed by [facility].  CSW signing off.   Please re-consult if social work needs arise.  Alison Murray, MSW, [place] Work [phone]

## 2014-04-23 NOTE — Progress Notes (Signed)
PROGRESS NOTE  Wendy Hoffman OAC:[id number] DOB: 22-Jun-1966 DOA: 04/22/2014 PCP: No primary provider on file.  HPI: Wendy Hoffman is a 48 y.o. female with asthma and other PMH as below who has been short of breath for 2 wks now. She initially had a cold with mild fever, cough and congestion. The fever, and congestion resolved but she has become progressively more short of breath with and uncontrolled dry cough. She has been wheezing extensively and despite using her Nebulizers 3 x a day and her Flovent, she has not improved. She is being admitted for an asthma flare.   Assessment/Plan: Asthma exacerbation  - started after a trip in [place], daughter diagnosed with the flu, her flu like symptoms have resolved, she has no fevers no sputum production.  - continue Duonebs, Pulmicort nebs, IV Solumedrol and Singulair.  - improving, still wheezing  - no need for antibiotics  HTN  - cont Home medication   Migraine Headache  - she states Tylenol with Codeine has helped in the past- will order this as PRN    Diet: heart Fluids: none DVT Prophylaxis: Lovenox  Code Status: Full Family Communication: d/w patient  Disposition Plan: home when ready   Consultants:  None   Procedures:  None    Antibiotics - none  HPI/Subjective: - feels better, still wheezing   Objective: Filed Vitals:   04/22/14 2257 04/22/14 2334 04/23/14 0411 04/23/14 0625  BP: 162/105   132/87  Pulse: 99   95  Temp: 97.9 F (36.6 C)   97.9 F (36.6 C)  TempSrc: Oral   Oral  Resp: 20   20  Height:      Weight:      SpO2: 97% 98% 95% 96%    Intake/Output Summary (Last 24 hours) at 04/23/14 0727 Last data filed at 04/22/14 1836  Gross per 24 hour  Intake    240 ml  Output      0 ml  Net    240 ml   Filed Weights   04/22/14 1644  Weight: 132.9 kg (292 lb 15.9 oz)   Exam:  General:  NAD  Cardiovascular: regular rate and rhythm, without MRG  Respiratory: good air movement, scattered  wheezing  Abdomen: soft, not tender to palpation, positive bowel sounds  MSK: no peripheral edema  Neuro: non focal  Data Reviewed: Basic Metabolic Panel:  Recent Labs Lab 04/22/14 1340 04/23/14 0348  NA 142 139  K 3.7 3.5*  CL 102 97  CO2 29 25  GLUCOSE 105* 188*  BUN 8 9  CREATININE 0.65 0.61  CALCIUM 9.2 9.6   CBC:  Recent Labs Lab 04/22/14 1340 04/23/14 0348  WBC 5.5 7.1  HGB 13.1 12.7  HCT 39.1 38.2  MCV 90.5 91.0  PLT 339 318   Cardiac Enzymes: No results found for this basename: CKTOTAL, CKMB, CKMBINDEX, TROPONINI,  in the last 168 hours BNP (last 3 results) No results found for this basename: PROBNP,  in the last 8760 hours CBG: No results found for this basename: GLUCAP,  in the last 168 hours  No results found for this or any previous visit (from the past 240 hour(s)).   Studies: Dg Chest 2 View  04/22/2014   CLINICAL DATA:  Shortness of breath and coughing with history of asthma and long-term tobacco use.  EXAM: CHEST  2 VIEW  COMPARISON:  DG CHEST 2 VIEW dated 04/03/2014  FINDINGS: The knee lungs are borderline hypoinflated. There is no focal infiltrate.  The interstitial markings are mildly prominent bilaterally but are stable. There is no pleural effusion or pneumothorax. The mediastinum is normal in width. The cardiopericardial silhouette is normal in size. The pulmonary vascularity is not engorged. The observed portions of the bony thorax exhibit no acute abnormalities.  IMPRESSION: There is no evidence of pneumonia nor CHF. Coarse interstitial lung markings are not new and likely reflect the patient's smoking history.   Electronically Signed   By: David  Martinique   On: 04/22/2014 14:38    Scheduled Meds: . budesonide (PULMICORT) nebulizer solution  0.25 mg Nebulization BID  . enoxaparin (LOVENOX) injection  40 mg Subcutaneous Q24H  . hydrochlorothiazide  25 mg Oral QHS  . ipratropium-albuterol  3 mL Nebulization Q4H  . methylPREDNISolone  (SOLU-MEDROL) injection  60 mg Intravenous Q8H  . montelukast  10 mg Oral QHS   Continuous Infusions:   Active Problems:   Asthma exacerbation   Asthma attack   Time spent: 25  This note has been created with Surveyor, quantity. Any transcriptional errors are unintentional.   Marzetta Board, MD [facility] Pager [phone]. If 7 PM - 7 AM, please contact night-coverage at [email or web address], password [facility] 04/23/2014, 7:27 AM  LOS: 1 day

## 2014-04-23 NOTE — Care Management Note (Signed)
Cm spoke with patient at the bedside concerning discharge planning. No PCP on record. Pt provided with information concerning [facility]. Pt states has been previously seen at clinic within past year. [facility] following pt. Pt states recently applied for the [facility]. Pt employed. Per pt able to afford generic meds at discharge but will not be able to afford inhalers.    Venita Lick Dorthy Hustead,MSN,RN [phone]

## 2014-04-24 LAB — BASIC METABOLIC PANEL
BUN: 13 mg/dL (ref 6–23)
CALCIUM: 10.5 mg/dL (ref 8.4–10.5)
CO2: 25 mEq/L (ref 19–32)
CREATININE: 0.73 mg/dL (ref 0.50–1.10)
Chloride: 96 mEq/L (ref 96–112)
GFR calc Af Amer: >90 mL/min (ref >90)
GLUCOSE: 177 mg/dL — AB (ref 70–99)
POTASSIUM: 3.9 meq/L (ref 3.7–5.3)
Sodium: 136 mEq/L — ABNORMAL LOW (ref 137–147)

## 2014-04-24 MED ORDER — PREDNISONE 50 MG PO TABS
50.0000 mg | ORAL_TABLET | Freq: Every day | ORAL | Status: DC
  Administered 2014-04-24 – 2014-04-25 (×2): 50 mg via ORAL
  Filled 2014-04-24 (×3): qty 1

## 2014-04-24 MED ORDER — ZOLPIDEM TARTRATE 5 MG PO TABS
5.0000 mg | ORAL_TABLET | Freq: Every evening | ORAL | Status: DC | PRN
  Administered 2014-04-24: 5 mg via ORAL
  Filled 2014-04-24: qty 1

## 2014-04-24 NOTE — Progress Notes (Signed)
PROGRESS NOTE  Wendy Hoffman WGY:[id number] DOB: [birth year] DOA: 04/22/2014 PCP: No primary provider on file.  HPI: Wendy Hoffman is a 48 y.o. female with asthma and other PMH as below who has been short of breath for 2 wks now. She initially had a cold with mild fever, cough and congestion. The fever, and congestion resolved but she has become progressively more short of breath with and uncontrolled dry cough. She has been wheezing extensively and despite using her Nebulizers 3 x a day and her Flovent, she has not improved. She is being admitted for an asthma flare.   Assessment/Plan: Asthma exacerbation  - started after a trip in [place], daughter diagnosed with the flu, her flu like symptoms have resolved, she has no fevers no sputum production.  - continue Duonebs, Pulmicort nebs, and Singulair.  - change IV steroids to prednisone - improving, still wheezing, feels "tight" when she takes a deep breath - no need for antibiotics  HTN  - cont Home medication   Migraine Headache  - she states Tylenol with Codeine has helped in the past- will order this as PRN    Diet: heart Fluids: none DVT Prophylaxis: Lovenox  Code Status: Full Family Communication: d/w patient  Disposition Plan: home when ready, anticipate d/c 5/14  Consultants:  None   Procedures:  None    Antibiotics - none  HPI/Subjective: - feels better, still wheezing   Objective: Filed Vitals:   04/23/14 1545 04/23/14 2043 04/23/14 2237 04/24/14 0549  BP:   149/95 153/90  Pulse:   97 95  Temp:   98.2 F (36.8 C) 98 F (36.7 C)  TempSrc:   Oral Oral  Resp:   20 18  Height:      Weight:      SpO2: 100% 100% 98% 95%    Intake/Output Summary (Last 24 hours) at 04/24/14 0728 Last data filed at 04/23/14 1900  Gross per 24 hour  Intake    480 ml  Output      0 ml  Net    480 ml   Filed Weights   04/22/14 1644  Weight: 132.9 kg (292 lb 15.9 oz)   Exam:  General:   NAD  Cardiovascular: regular rate and rhythm, without MRG  Respiratory: good air movement, persistent scattered wheezing  Abdomen: soft, not tender to palpation, positive bowel sounds  MSK: no peripheral edema  Neuro: non focal  Data Reviewed: Basic Metabolic Panel:  Recent Labs Lab 04/22/14 1340 04/23/14 0348 04/24/14 0324  NA 142 139 136*  K 3.7 3.5* 3.9  CL 102 97 96  CO2 29 25 25   GLUCOSE 105* 188* 177*  BUN 8 9 13   CREATININE 0.65 0.61 0.73  CALCIUM 9.2 9.6 10.5   CBC:  Recent Labs Lab 04/22/14 1340 04/23/14 0348  WBC 5.5 7.1  HGB 13.1 12.7  HCT 39.1 38.2  MCV 90.5 91.0  PLT 339 318    Studies: Dg Chest 2 View  04/22/2014   CLINICAL DATA:  Shortness of breath and coughing with history of asthma and long-term tobacco use.  EXAM: CHEST  2 VIEW  COMPARISON:  DG CHEST 2 VIEW dated 04/03/2014  FINDINGS: The knee lungs are borderline hypoinflated. There is no focal infiltrate. The interstitial markings are mildly prominent bilaterally but are stable. There is no pleural effusion or pneumothorax. The mediastinum is normal in width. The cardiopericardial silhouette is normal in size. The pulmonary vascularity is not engorged. The observed portions of the  bony thorax exhibit no acute abnormalities.  IMPRESSION: There is no evidence of pneumonia nor CHF. Coarse interstitial lung markings are not new and likely reflect the patient's smoking history.   Electronically Signed   By: David  Martinique   On: 04/22/2014 14:38    Scheduled Meds: . budesonide (PULMICORT) nebulizer solution  0.25 mg Nebulization BID  . enoxaparin (LOVENOX) injection  40 mg Subcutaneous Q24H  . hydrochlorothiazide  25 mg Oral QHS  . ipratropium-albuterol  3 mL Nebulization Q4H  . montelukast  10 mg Oral QHS  . predniSONE  50 mg Oral Q breakfast   Continuous Infusions:   Active Problems:   Asthma exacerbation   Asthma attack   Time spent: 25  This note has been created with Biomedical engineer. Any transcriptional errors are unintentional.   Marzetta Board, MD [facility] Pager [phone]. If 7 PM - 7 AM, please contact night-coverage at [email or web address], password [facility] 04/24/2014, 7:28 AM  LOS: 2 days

## 2014-04-25 DIAGNOSIS — F17200 Nicotine dependence, unspecified, uncomplicated: Secondary | ICD-10-CM

## 2014-04-25 DIAGNOSIS — I10 Essential (primary) hypertension: Secondary | ICD-10-CM

## 2014-04-25 MED ORDER — HYDROCHLOROTHIAZIDE 25 MG PO TABS: 25.0000 mg | ORAL_TABLET | Freq: Every day | ORAL | Status: DC

## 2014-04-25 MED ORDER — ZOLPIDEM TARTRATE 5 MG PO TABS: 5.0000 mg | ORAL_TABLET | Freq: Every evening | ORAL | Status: DC | PRN

## 2014-04-25 MED ORDER — HYDROCODONE-ACETAMINOPHEN 5-325 MG PO TABS: ORAL_TABLET | ORAL | Status: DC

## 2014-04-25 MED ORDER — ALBUTEROL SULFATE HFA 108 (90 BASE) MCG/ACT IN AERS: 1.0000 | INHALATION_SPRAY | Freq: Four times a day (QID) | RESPIRATORY_TRACT | Status: DC | PRN

## 2014-04-25 MED ORDER — ASPIRIN-ACETAMINOPHEN-CAFFEINE 250-250-65 MG PO TABS
4.0000 | ORAL_TABLET | Freq: Four times a day (QID) | ORAL | Status: DC | PRN
Start: 2014-04-25 — End: 2014-09-09

## 2014-04-25 MED ORDER — PREDNISONE 10 MG PO TABS: ORAL_TABLET | ORAL | Status: DC

## 2014-04-25 MED ORDER — IPRATROPIUM-ALBUTEROL 0.5-2.5 (3) MG/3ML IN SOLN
3.0000 mL | Freq: Four times a day (QID) | RESPIRATORY_TRACT | Status: DC
  Administered 2014-04-25 (×2): 3 mL via RESPIRATORY_TRACT
  Filled 2014-04-25 (×2): qty 3

## 2014-04-25 NOTE — Progress Notes (Signed)
Nursing Discharge Summary  Patient ID: Wendy Hoffman MRN: [id number] DOB/AGE: 09/01/1966 48 y.o.  Admit date: 04/22/2014 Discharge date: 04/25/2014  Discharged Condition: good  Disposition: 01-Home or Self Care  Follow-up Information   Follow up with [facility]. (Call to schedule an appointment at [phone] or [phone] or [phone])    Contact information:   [place] [address] [phone]      Prescriptions Given: Patient received prescriptions for Albuterol inhaler, Ambien, and Vicodin. Patient had prescriptions called into pharmacy for excedrin, prednisone. Patient was given Flovent inhaler to take home.   Means of Discharge: Patient will be transported home via private vehicle. Stable from AM assessment.   Signed: Buel Ream 04/25/2014, 12:51 PM

## 2014-04-25 NOTE — Discharge Summary (Signed)
Physician Discharge Summary  Wendy Hoffman SAY:[id number] DOB: June 29, 1966 DOA: 04/22/2014  PCP: No primary provider on file.  Admit date: 04/22/2014 Discharge date: 04/25/2014  Time spent: 20 minutes  Recommendations for Outpatient Follow-up:  1. New medications: Prednisone quick taper over next 4 days 2. Medication: Ambien 5 mg by mouth each bedtime when necessary. 3. Medication: Patient's albuterol inhaler is being refilled 4. Patient is getting a referral for the [facility]  Discharge Diagnoses:  Active Problems:   Hypertension   Unspecified hypothyroidism   Tobacco abuse   Asthma exacerbation   Asthma attack   Morbid obesity   Discharge Condition: Improved, being discharged home  Diet recommendation: Low-sodium  Filed Weights   04/22/14 1644  Weight: 132.9 kg (292 lb 15.9 oz)    History of present illness:  Patient is [name] past oral history of asthma hypertension mild tobacco abuse who has had increasing shortness of breath for several weeks for presenting on 5/11. She normally uses her albuterol inhaler once or twice a day, but was using it as much a 6-8 times a day for coming in to the emergency room. There she was found to be with an acute asthma exacerbation. She started on nebulizers, oxygen and steroids. She was admitted to the hospitalist service.  Hospital Course:  Active Problems:   Hypertension: Stable. Patient ran out of her hydrochlorothiazide. His medication is being refilled.    Unspecified hypothyroidism: Seen in patient's previous history. TSH in December was normal. She's not on Synthroid. This can be followed by her new primary care physician.    Tobacco abuse: Patient states that she is quitting. She does not want to smoke anymore. Her new PCP can followup to see how she's doing    Asthma exacerbation: Principal problem: Patient advised to quit smoking. She says she will do so. Likely  exacerbating factors are high pollen count plus ongoing tobacco use. No signs of infection or pneumonia. Patient responded well to oxygen plus nebulizers plus steroids. By day of discharge, patient oxygen saturation was 99% and remained. She still had some mild end expiratory wheezing. To be discharged on her Flovent, albuterol nebulizer and inhaler plus quick tapering steroids.      Morbid obesity: Patient meets criteria with BMI on admission of 50.4  Procedures:  None  Consultations:  None  Discharge Exam: Filed Vitals:   04/25/14 0638  BP: 152/88  Pulse: 72  Temp: 97.8 F (36.6 C)  Resp: 16    General: Alert and oriented x3, no acute distress Cardiovascular: Regular rate and rhythm, S1-S2 Respiratory: Mild bilateral end expiratory wheeze  Discharge Instructions You were cared for by a hospitalist during your hospital stay. If you have any questions about your discharge medications or the care you received while you were in the hospital after you are discharged, you can call the unit and asked to speak with the hospitalist on call if the hospitalist that took care of you is not available. Once you are discharged, your primary care physician will handle any further medical issues. Please note that NO REFILLS for any discharge medications will be authorized once you are discharged, as it is imperative that you return to your primary care physician (or establish a relationship with a primary care physician if you do not have one) for your aftercare needs so that they can reassess your need for medications and monitor your lab values.  Discharge Orders   Future Orders Complete By Expires  Diet - low sodium heart healthy  As directed    Increase activity slowly  As directed        Medication List         albuterol (2.5 MG/3ML) 0.083% nebulizer solution  Commonly known as:  PROVENTIL  Take 3 mLs (2.5 mg total) by nebulization every 4 (four) hours as needed for wheezing or  shortness of breath.     albuterol 108 (90 BASE) MCG/ACT inhaler  Commonly known as:  PROVENTIL HFA;VENTOLIN HFA  Inhale 1 puff into the lungs every 6 (six) hours as needed for wheezing or shortness of breath.     aspirin-acetaminophen-caffeine 782-423-53 MG per tablet  Commonly known as:  EXCEDRIN MIGRAINE  Take 4 tablets by mouth every 6 (six) hours as needed for headache or migraine.     fluticasone 220 MCG/ACT inhaler  Commonly known as:  FLOVENT HFA  Inhale 2 puffs into the lungs 2 (two) times daily.     hydrochlorothiazide 25 MG tablet  Commonly known as:  HYDRODIURIL  Take 1 tablet (25 mg total) by mouth at bedtime.     HYDROcodone-acetaminophen 5-325 MG per tablet  Commonly known as:  NORCO/VICODIN  Take 1-2 tabs every 4-6 hours as needed for pain.     naproxen sodium 220 MG tablet  Commonly known as:  ANAPROX  Take 880 mg by mouth 2 (two) times daily as needed (For pain.).     predniSONE 10 MG tablet  Commonly known as:  DELTASONE  40 mg on day 1, 30 mg on day 2, 20mg  on day 3 and 10 mg on day 4     zolpidem 5 MG tablet  Commonly known as:  AMBIEN  Take 1 tablet (5 mg total) by mouth at bedtime as needed for sleep.       Allergies  Allergen Reactions  . Mango Flavor Shortness Of Breath  . Aspirin Nausea And Vomiting  . Blueberry [Vaccinium Angustifolium] Rash  . Penicillins Rash       Follow-up Information   Follow up with [facility]. (Call to schedule an appointment at [phone] or [phone] or [phone])    Contact information:   [place] [address] [phone]       The results of significant diagnostics from this hospitalization (including imaging, microbiology, ancillary and laboratory) are listed below for reference.    Significant Diagnostic Studies: Dg Chest 2 View  04/22/2014   CLINICAL DATA:  Shortness of breath and coughing with history of asthma and long-term tobacco use.  EXAM: CHEST   2 VIEW  COMPARISON:  DG CHEST 2 VIEW dated 04/03/2014  FINDINGS: The knee lungs are borderline hypoinflated. There is no focal infiltrate. The interstitial markings are mildly prominent bilaterally but are stable. There is no pleural effusion or pneumothorax. The mediastinum is normal in width. The cardiopericardial silhouette is normal in size. The pulmonary vascularity is not engorged. The observed portions of the bony thorax exhibit no acute abnormalities.  IMPRESSION: There is no evidence of pneumonia nor CHF. Coarse interstitial lung markings are not new and likely reflect the patient's smoking history.   Electronically Signed   By: David  Martinique   On: 04/22/2014 14:38   Dg Chest 2 View  04/03/2014   CLINICAL DATA:  Cough and fever for 4 days.  EXAM: CHEST  2 VIEW  COMPARISON:  12/19/2013  FINDINGS: Cardiac silhouette is upper limits of normal in size. The lungs are mildly  hypoinflated, unchanged. There is no evidence of airspace consolidation, edema, pleural effusion, or pneumothorax. No acute osseous abnormality is seen.  IMPRESSION: No active cardiopulmonary disease.   Electronically Signed   By: Logan Bores   On: 04/03/2014 16:46    Microbiology: No results found for this or any previous visit (from the past 240 hour(s)).   Labs: Basic Metabolic Panel:  Recent Labs Lab 04/22/14 1340 04/23/14 0348 04/24/14 0324  NA 142 139 136*  K 3.7 3.5* 3.9  CL 102 97 96  CO2 29 25 25   GLUCOSE 105* 188* 177*  BUN 8 9 13   CREATININE 0.65 0.61 0.73  CALCIUM 9.2 9.6 10.5   Liver Function Tests: No results found for this basename: AST, ALT, ALKPHOS, BILITOT, PROT, ALBUMIN,  in the last 168 hours No results found for this basename: LIPASE, AMYLASE,  in the last 168 hours No results found for this basename: AMMONIA,  in the last 168 hours CBC:  Recent Labs Lab 04/22/14 1340 04/23/14 0348  WBC 5.5 7.1  HGB 13.1 12.7  HCT 39.1 38.2  MCV 90.5 91.0  PLT 339 318   Cardiac Enzymes: No  results found for this basename: CKTOTAL, CKMB, CKMBINDEX, TROPONINI,  in the last 168 hours BNP: BNP (last 3 results) No results found for this basename: PROBNP,  in the last 8760 hours CBG: No results found for this basename: GLUCAP,  in the last 168 hours     Signed:  Annita Brod  [facility] 04/25/2014, 11:19 AM

## 2014-04-25 NOTE — Care Management Note (Signed)
Cm provided patient with [place] letter to assist with non generic inhalers prescribed at discharge. Pt informed [place] assistance eligibility once yearly at participating pharmacies. No other needs listed.    Venita Lick Derrico Zhong,MSN,RN [phone]

## 2014-05-15 ENCOUNTER — Inpatient Hospital Stay: Payer: Self-pay

## 2014-05-29 ENCOUNTER — Ambulatory Visit: Payer: Self-pay | Attending: Internal Medicine | Admitting: Internal Medicine

## 2014-05-29 ENCOUNTER — Encounter: Payer: Self-pay | Admitting: Internal Medicine

## 2014-05-29 VITALS — BP 120/70 | HR 79 | Temp 98.1°F | Resp 16 | Ht 64.5 in | Wt 284.4 lb

## 2014-05-29 DIAGNOSIS — E785 Hyperlipidemia, unspecified: Secondary | ICD-10-CM | POA: Insufficient documentation

## 2014-05-29 DIAGNOSIS — I10 Essential (primary) hypertension: Secondary | ICD-10-CM | POA: Insufficient documentation

## 2014-05-29 DIAGNOSIS — J45909 Unspecified asthma, uncomplicated: Secondary | ICD-10-CM | POA: Insufficient documentation

## 2014-05-29 DIAGNOSIS — R0602 Shortness of breath: Secondary | ICD-10-CM | POA: Insufficient documentation

## 2014-05-29 DIAGNOSIS — F17200 Nicotine dependence, unspecified, uncomplicated: Secondary | ICD-10-CM | POA: Insufficient documentation

## 2014-05-29 DIAGNOSIS — R05 Cough: Secondary | ICD-10-CM | POA: Insufficient documentation

## 2014-05-29 DIAGNOSIS — R059 Cough, unspecified: Secondary | ICD-10-CM | POA: Insufficient documentation

## 2014-05-29 MED ORDER — PREDNISONE 10 MG PO TABS: ORAL_TABLET | ORAL | Status: DC

## 2014-05-29 MED ORDER — IPRATROPIUM-ALBUTEROL 0.5-2.5 (3) MG/3ML IN SOLN
3.0000 mL | Freq: Once | RESPIRATORY_TRACT | Status: AC
  Administered 2014-05-29: 3 mL via RESPIRATORY_TRACT

## 2014-05-29 MED ORDER — AZITHROMYCIN 250 MG PO TABS: ORAL_TABLET | ORAL | Status: DC

## 2014-05-29 MED ORDER — ALBUTEROL SULFATE (2.5 MG/3ML) 0.083% IN NEBU: 2.5000 mg | INHALATION_SOLUTION | RESPIRATORY_TRACT | Status: DC | PRN

## 2014-05-29 MED ORDER — ALBUTEROL SULFATE HFA 108 (90 BASE) MCG/ACT IN AERS: 1.0000 | INHALATION_SPRAY | RESPIRATORY_TRACT | Status: DC | PRN

## 2014-05-29 NOTE — Progress Notes (Addendum)
HFU for asthma States chest tightness stays the same. C/O constant H/A rates 5/10 C/O right hip pain rates 3/10  Patient given peak flow meter with verbal instructions. Patient was able to perform return demonstration.

## 2014-05-29 NOTE — Addendum Note (Signed)
Addended by: Velora Heckler on: 05/29/2014 10:58 AM   Modules accepted: Orders

## 2014-05-29 NOTE — Progress Notes (Signed)
Patient ID: Wendy Hoffman, female   DOB: 02-07-1966, 48 y.o.   MRN: [id number]   CC: Hospital followup  HPI: Patient is 48 year old female with history of hyperlipidemia, hypertension, severe asthma, recently hospitalized for asthma flare, discharged after one week of hospital stay. She reports persistent shortness of breath, worse with exertion and better with rest, associated with wheezing, productive cough of yellow sputum. Patient also reports subjective fevers and chills. She denies any specific abdominal or urinary concerns.   Allergies  Allergen Reactions  . Mango Flavor Shortness Of Breath  . Aspirin Nausea And Vomiting  . Blueberry [Vaccinium Angustifolium] Rash  . Penicillins Rash   Past Medical History  Diagnosis Date  . Asthma   . Thyroid disease   . Hypertension   . High cholesterol   . Migraine   . Morbid obesity   . H/O: hysterectomy   . Medical history non-contributory   . Hypothyroidism   . Anxiety   . Shortness of breath   . Stroke   . Arthritis   . Anemia   . Cancer    Current Outpatient Prescriptions on File Prior to Visit  Medication Sig Dispense Refill  . aspirin-acetaminophen-caffeine (EXCEDRIN MIGRAINE) 250-250-65 MG per tablet Take 4 tablets by mouth every 6 (six) hours as needed for headache or migraine.  30 tablet  0  . fluticasone (FLOVENT HFA) 220 MCG/ACT inhaler Inhale 2 puffs into the lungs 2 (two) times daily.  1 Inhaler  12  . hydrochlorothiazide (HYDRODIURIL) 25 MG tablet Take 1 tablet (25 mg total) by mouth at bedtime.  90 tablet  1  . HYDROcodone-acetaminophen (NORCO/VICODIN) 5-325 MG per tablet Take 1-2 tabs every 4-6 hours as needed for pain.  6 tablet  0  . naproxen sodium (ANAPROX) 220 MG tablet Take 880 mg by mouth 2 (two) times daily as needed (For pain.).       Marland Kitchen zolpidem (AMBIEN) 5 MG tablet Take 1 tablet (5 mg total) by mouth at bedtime as needed for sleep.  30 tablet  0   No current facility-administered medications on file prior  to visit.   Family History  Problem Relation Age of Onset  . Migraines Sister    History   Social History  . Marital Status: Divorced    Spouse Name: N/A    Number of Children: N/A  . Years of Education: N/A   Occupational History  . Not on file.   Social History Main Topics  . Smoking status: Current Every Day Smoker    Types: Cigarettes  . Smokeless tobacco: Never Used  . Alcohol Use: No  . Drug Use: No  . Sexual Activity: Not Currently   Other Topics Concern  . Not on file   Social History Narrative  . No narrative on file    Review of Systems  Constitutional: Negative for activity change, appetite change and fatigue.  HENT: Negative for ear pain, nosebleeds, congestion, facial swelling, rhinorrhea, neck pain, neck stiffness and ear discharge.   Eyes: Negative for pain, discharge, redness, itching and visual disturbance.  Respiratory: Per history of present illness   Cardiovascular: Negative for chest pain, palpitations and leg swelling.  Gastrointestinal: Negative for abdominal distention.  Genitourinary: Negative for dysuria, urgency, frequency, hematuria, flank pain, decreased urine volume, difficulty urinating and dyspareunia.  Musculoskeletal: Negative for back pain, joint swelling, arthralgias and gait problem.  Neurological: Negative for dizziness, tremors, seizures, syncope, facial asymmetry, speech difficulty, weakness, light-headedness, numbness and headaches.  Hematological: Negative for adenopathy.  Does not bruise/bleed easily.  Psychiatric/Behavioral: Negative for hallucinations, behavioral problems, confusion, dysphoric mood, decreased concentration and agitation.    Objective:   Filed Vitals:   05/29/14 0944  BP: 120/70  Pulse: 79  Temp: 98.1 F (36.7 C)  Resp: 16    Physical Exam  Constitutional: Appears well-developed and well-nourished. No distress.  HENT: Normocephalic. External right and left ear normal. Oropharynx is clear and moist.   Eyes: Conjunctivae and EOM are normal. PERRLA, no scleral icterus.  Neck: Normal ROM. Neck supple. No JVD. No tracheal deviation. No thyromegaly.  CVS: RRR, S1/S2 +, no murmurs, no gallops, no carotid bruit.  Pulmonary: Diminished air movement bilaterally with expiratory and mild inspiratory wheezing, persistently coughing with yellow sputum  Abdominal: Soft. BS +,  no distension, tenderness, rebound or guarding.  Musculoskeletal: Normal range of motion. No edema and no tenderness.  Lymphadenopathy: No lymphadenopathy noted, cervical, inguinal. Neuro: Alert. Normal reflexes, muscle tone coordination. No cranial nerve deficit. Skin: Skin is warm and dry. No rash noted. Not diaphoretic. No erythema. No pallor.  Psychiatric: Normal mood and affect. Behavior, judgment, thought content normal.   Lab Results  Component Value Date   WBC 7.1 04/23/2014   HGB 12.7 04/23/2014   HCT 38.2 04/23/2014   MCV 91.0 04/23/2014   PLT 318 04/23/2014   Lab Results  Component Value Date   CREATININE 0.73 04/24/2014   BUN 13 04/24/2014   NA 136* 04/24/2014   K 3.9 04/24/2014   CL 96 04/24/2014   CO2 25 04/24/2014    No results found for this basename: HGBA1C   Lipid Panel     Component Value Date/Time   CHOL  Value: 137        ATP III CLASSIFICATION:  <200     mg/dL   Desirable  200-239  mg/dL   Borderline High  >=240    mg/dL   High 10/15/2008 0535   TRIG 155* 10/15/2008 0535   HDL 35* 10/15/2008 0535   CHOLHDL 3.9 10/15/2008 0535   VLDL 31 10/15/2008 0535   LDLCALC  Value: 71        Total Cholesterol/HDL:CHD Risk Coronary Heart Disease Risk Table                     Men   Women  1/2 Average Risk   3.4   3.3 10/15/2008 0535       Assessment and plan:   Patient Active Problem List   Diagnosis Date Noted  . Asthma exacerbation 04/22/2014   - Will provide nebulizer treatment in the clinic today - Patient will be started on course of antibiotic Zithromax to complete therapy for 10 days - In addition  prescription provided for albuterol inhaler and nebulizer to use as needed for shortness of breath - Will start patient on prednisone taper pack, over the next 2 weeks period - Patient advised to avoid exposure to tobacco, consult on smoking cessation - Patient also advised to come back to see [place] if her symptoms do not improve in the next 24-48 hours - If her symptoms become significantly worse, she was also advised to call 911

## 2014-06-19 ENCOUNTER — Ambulatory Visit: Payer: Self-pay

## 2014-06-24 ENCOUNTER — Emergency Department (HOSPITAL_COMMUNITY): Payer: Self-pay

## 2014-06-24 ENCOUNTER — Encounter (HOSPITAL_COMMUNITY): Payer: Self-pay | Admitting: Emergency Medicine

## 2014-06-24 ENCOUNTER — Emergency Department (HOSPITAL_COMMUNITY)
Admission: EM | Admit: 2014-06-24 | Discharge: 2014-06-24 | Disposition: A | Discharge status: Home / Self Care | Payer: Self-pay | Attending: Emergency Medicine | Admitting: Emergency Medicine

## 2014-06-24 DIAGNOSIS — Z79899 Other long term (current) drug therapy: Secondary | ICD-10-CM | POA: Insufficient documentation

## 2014-06-24 DIAGNOSIS — S99929A Unspecified injury of unspecified foot, initial encounter: Principal | ICD-10-CM

## 2014-06-24 DIAGNOSIS — W010XXA Fall on same level from slipping, tripping and stumbling without subsequent striking against object, initial encounter: Secondary | ICD-10-CM | POA: Insufficient documentation

## 2014-06-24 DIAGNOSIS — S99919A Unspecified injury of unspecified ankle, initial encounter: Principal | ICD-10-CM

## 2014-06-24 DIAGNOSIS — Z859 Personal history of malignant neoplasm, unspecified: Secondary | ICD-10-CM | POA: Insufficient documentation

## 2014-06-24 DIAGNOSIS — F411 Generalized anxiety disorder: Secondary | ICD-10-CM | POA: Insufficient documentation

## 2014-06-24 DIAGNOSIS — F17200 Nicotine dependence, unspecified, uncomplicated: Secondary | ICD-10-CM | POA: Insufficient documentation

## 2014-06-24 DIAGNOSIS — IMO0002 Reserved for concepts with insufficient information to code with codable children: Secondary | ICD-10-CM | POA: Insufficient documentation

## 2014-06-24 DIAGNOSIS — S8990XA Unspecified injury of unspecified lower leg, initial encounter: Secondary | ICD-10-CM | POA: Insufficient documentation

## 2014-06-24 DIAGNOSIS — Y9389 Activity, other specified: Secondary | ICD-10-CM | POA: Insufficient documentation

## 2014-06-24 DIAGNOSIS — Y9289 Other specified places as the place of occurrence of the external cause: Secondary | ICD-10-CM | POA: Insufficient documentation

## 2014-06-24 DIAGNOSIS — Z8673 Personal history of transient ischemic attack (TIA), and cerebral infarction without residual deficits: Secondary | ICD-10-CM | POA: Insufficient documentation

## 2014-06-24 DIAGNOSIS — Z792 Long term (current) use of antibiotics: Secondary | ICD-10-CM | POA: Insufficient documentation

## 2014-06-24 DIAGNOSIS — Z880 Allergy status to penicillin: Secondary | ICD-10-CM | POA: Insufficient documentation

## 2014-06-24 DIAGNOSIS — M25562 Pain in left knee: Secondary | ICD-10-CM

## 2014-06-24 DIAGNOSIS — M129 Arthropathy, unspecified: Secondary | ICD-10-CM | POA: Insufficient documentation

## 2014-06-24 DIAGNOSIS — J45909 Unspecified asthma, uncomplicated: Secondary | ICD-10-CM | POA: Insufficient documentation

## 2014-06-24 NOTE — ED Provider Notes (Signed)
CSN: [id number]     Arrival date & time 06/24/14  1827 History  This chart was scribed for Hyman Bible, PA-C working with Orpah Greek, * by Randa Evens, ED Scribe. This patient was seen in room WTR7/WTR7 and the patient's care was started at 7:28 PM.    Chief Complaint  Patient presents with  . Knee Pain   Patient is a 48 y.o. female presenting with knee pain. The history is provided by the patient. No language interpreter was used.  Knee Pain Associated symptoms: no fever    HPI Comments: Wendy Hoffman is a 48 y.o. female who presents to the Emergency Department complaining of left knee pain onset 3 weeks prior. She states she tripped over and fell over something and fell onto her knee. She states she has been taken tylenol extra strength with some temporary relief. Pain worse with ambulating.   She denies fever, chills, numbness, or tingling or other related symptoms.   Past Medical History  Diagnosis Date  . Asthma   . Thyroid disease   . Hypertension   . High cholesterol   . Migraine   . Morbid obesity   . H/O: hysterectomy   . Medical history non-contributory   . Hypothyroidism   . Anxiety   . Shortness of breath   . Stroke   . Arthritis   . Anemia   . Cancer    Past Surgical History  Procedure Laterality Date  . Cesarean section      3 occasions  . Thyroidectomy, partial      Goiter  . Cholecystectomy    . Abdominal hysterectomy     Family History  Problem Relation Age of Onset  . Migraines Sister    History  Substance Use Topics  . Smoking status: Current Every Day Smoker    Types: Cigarettes  . Smokeless tobacco: Never Used  . Alcohol Use: No   OB History   Grav Para Term Preterm Abortions TAB SAB Ect Mult Living                 Review of Systems  Constitutional: Negative for fever and chills.  Musculoskeletal: Positive for arthralgias.    Allergies  Mango flavor; Aspirin; Blueberry; and Penicillins  Home Medications    Prior to Admission medications   Medication Sig Start Date End Date Taking? Authorizing Provider  albuterol (PROVENTIL HFA;VENTOLIN HFA) 108 (90 BASE) MCG/ACT inhaler Inhale 1 puff into the lungs every 4 (four) hours as needed for wheezing or shortness of breath. 05/29/14   Theodis Blaze, MD  albuterol (PROVENTIL) (2.5 MG/3ML) 0.083% nebulizer solution Take 3 mLs (2.5 mg total) by nebulization every 4 (four) hours as needed for wheezing or shortness of breath. 05/29/14   Theodis Blaze, MD  aspirin-acetaminophen-caffeine (EXCEDRIN MIGRAINE) [phone] MG per tablet Take 4 tablets by mouth every 6 (six) hours as needed for headache or migraine. 04/25/14   Annita Brod, MD  azithromycin (ZITHROMAX) 250 MG tablet Take 500 mg tablet today and continue taking 250 mg tablet daily for 9 more days 05/29/14   Theodis Blaze, MD  fluticasone (FLOVENT HFA) 220 MCG/ACT inhaler Inhale 2 puffs into the lungs 2 (two) times daily. 11/05/13   Erline Hau, MD  hydrochlorothiazide (HYDRODIURIL) 25 MG tablet Take 1 tablet (25 mg total) by mouth at bedtime. 04/25/14   Annita Brod, MD  HYDROcodone-acetaminophen (NORCO/VICODIN) 5-325 MG per tablet Take 1-2 tabs every 4-6 hours as needed for  pain. 04/25/14   Annita Brod, MD  naproxen sodium (ANAPROX) 220 MG tablet Take 880 mg by mouth 2 (two) times daily as needed (For pain.).     Historical Provider, MD  predniSONE (DELTASONE) 10 MG tablet Take 60 mg tablet today and taper down by 5 mg daily until completed 05/29/14   Theodis Blaze, MD  zolpidem (AMBIEN) 5 MG tablet Take 1 tablet (5 mg total) by mouth at bedtime as needed for sleep. 04/25/14   Annita Brod, MD   BP 125/85  Pulse 75  Temp(Src) 98.1 F (36.7 C) (Oral)  Resp 16  SpO2 98%  Physical Exam  Nursing note and vitals reviewed. Constitutional: She is oriented to person, place, and time. She appears well-developed and well-nourished. No distress.  HENT:  Head: Normocephalic and  atraumatic.  Eyes: Conjunctivae and EOM are normal.  Neck: Neck supple. No tracheal deviation present.  Cardiovascular: Normal rate, regular rhythm and normal heart sounds.   Pulses:      Dorsalis pedis pulses are 2+ on the right side, and 2+ on the left side.  Pulmonary/Chest: Effort normal and breath sounds normal. No respiratory distress.  Musculoskeletal:       Left knee: She exhibits decreased range of motion. She exhibits no erythema. Tenderness found. Medial joint line tenderness noted.  Left Knee: No obvious erythema, edama or warmth Flexion of left knee slightly limited.  distal sensation of left foot intact  Neurological: She is alert and oriented to person, place, and time.  Skin: Skin is warm and dry.  Psychiatric: She has a normal mood and affect. Her behavior is normal.    ED Course  Procedures (including critical care time)  Labs Review Labs Reviewed - No data to display  Imaging Review Dg Knee Complete 4 Views Left  06/24/2014   CLINICAL DATA:  Left knee pain after fall.  EXAM: LEFT KNEE - COMPLETE 4+ VIEW  COMPARISON:  February 07, 2007.  FINDINGS: No fracture or dislocation is noted. Severe narrowing of lateral joint space is noted. Mild narrowing of medial joint space with osteophyte formation is noted. Mild narrowing of patellofemoral space is noted. No significant joint effusion is noted.  IMPRESSION: Moderate tricompartmental degenerative joint disease is noted. No acute abnormality seen in the left knee.   Electronically Signed   By: Sabino Dick M.D.   On: 06/24/2014 19:11     EKG Interpretation None      MDM   Final diagnoses:  None   Patient presenting with knee pain after a fall 3 weeks ago.  Xray showing mild tricompartmental degenerative joint disease, but no acute findings.  No signs of infection of the knee.  Patient discharged home with short course of pain medication and instructed to follow up with PCP.     Hyman Bible, PA-C 06/26/14  1044

## 2014-06-24 NOTE — ED Notes (Signed)
Pt reports she fell 3 weeks ago, injured left knee, knee still swollen and painful. Ambulatory. Pain 8/10.

## 2014-06-28 NOTE — ED Provider Notes (Signed)
Medical screening examination/treatment/procedure(s) were performed by non-physician practitioner and as supervising physician I was immediately available for consultation/collaboration.   EKG Interpretation None        Orpah Greek, MD 06/28/14 [phone]

## 2014-08-29 ENCOUNTER — Encounter (HOSPITAL_COMMUNITY): Payer: Self-pay | Admitting: Emergency Medicine

## 2014-08-29 ENCOUNTER — Emergency Department (HOSPITAL_COMMUNITY): Payer: Self-pay

## 2014-08-29 ENCOUNTER — Emergency Department (HOSPITAL_COMMUNITY)
Admission: EM | Admit: 2014-08-29 | Discharge: 2014-08-29 | Disposition: A | Discharge status: Home / Self Care | Payer: Self-pay | Attending: Emergency Medicine | Admitting: Emergency Medicine

## 2014-08-29 DIAGNOSIS — Z90710 Acquired absence of both cervix and uterus: Secondary | ICD-10-CM | POA: Insufficient documentation

## 2014-08-29 DIAGNOSIS — Z880 Allergy status to penicillin: Secondary | ICD-10-CM | POA: Insufficient documentation

## 2014-08-29 DIAGNOSIS — Z79899 Other long term (current) drug therapy: Secondary | ICD-10-CM | POA: Insufficient documentation

## 2014-08-29 DIAGNOSIS — G43909 Migraine, unspecified, not intractable, without status migrainosus: Secondary | ICD-10-CM | POA: Insufficient documentation

## 2014-08-29 DIAGNOSIS — I10 Essential (primary) hypertension: Secondary | ICD-10-CM | POA: Insufficient documentation

## 2014-08-29 DIAGNOSIS — Z859 Personal history of malignant neoplasm, unspecified: Secondary | ICD-10-CM | POA: Insufficient documentation

## 2014-08-29 DIAGNOSIS — Z862 Personal history of diseases of the blood and blood-forming organs and certain disorders involving the immune mechanism: Secondary | ICD-10-CM | POA: Insufficient documentation

## 2014-08-29 DIAGNOSIS — Z8659 Personal history of other mental and behavioral disorders: Secondary | ICD-10-CM | POA: Insufficient documentation

## 2014-08-29 DIAGNOSIS — R0602 Shortness of breath: Secondary | ICD-10-CM | POA: Insufficient documentation

## 2014-08-29 DIAGNOSIS — J45901 Unspecified asthma with (acute) exacerbation: Secondary | ICD-10-CM | POA: Insufficient documentation

## 2014-08-29 DIAGNOSIS — IMO0002 Reserved for concepts with insufficient information to code with codable children: Secondary | ICD-10-CM | POA: Insufficient documentation

## 2014-08-29 DIAGNOSIS — Z8673 Personal history of transient ischemic attack (TIA), and cerebral infarction without residual deficits: Secondary | ICD-10-CM | POA: Insufficient documentation

## 2014-08-29 DIAGNOSIS — M129 Arthropathy, unspecified: Secondary | ICD-10-CM | POA: Insufficient documentation

## 2014-08-29 DIAGNOSIS — F17200 Nicotine dependence, unspecified, uncomplicated: Secondary | ICD-10-CM | POA: Insufficient documentation

## 2014-08-29 MED ORDER — IPRATROPIUM-ALBUTEROL 0.5-2.5 (3) MG/3ML IN SOLN
3.0000 mL | Freq: Once | RESPIRATORY_TRACT | Status: AC
  Administered 2014-08-29: 3 mL via RESPIRATORY_TRACT
  Filled 2014-08-29: qty 3

## 2014-08-29 MED ORDER — KETOROLAC TROMETHAMINE 60 MG/2ML IM SOLN
60.0000 mg | Freq: Once | INTRAMUSCULAR | Status: AC
  Administered 2014-08-29: 60 mg via INTRAMUSCULAR
  Filled 2014-08-29: qty 2

## 2014-08-29 MED ORDER — ALBUTEROL SULFATE HFA 108 (90 BASE) MCG/ACT IN AERS
2.0000 | INHALATION_SPRAY | Freq: Four times a day (QID) | RESPIRATORY_TRACT | Status: DC | PRN
  Administered 2014-08-29: 2 via RESPIRATORY_TRACT
  Filled 2014-08-29: qty 6.7

## 2014-08-29 MED ORDER — DEXAMETHASONE SODIUM PHOSPHATE 10 MG/ML IJ SOLN
10.0000 mg | Freq: Once | INTRAMUSCULAR | Status: AC
  Administered 2014-08-29: 10 mg via INTRAMUSCULAR
  Filled 2014-08-29: qty 1

## 2014-08-29 MED ORDER — ALBUTEROL SULFATE (2.5 MG/3ML) 0.083% IN NEBU
5.0000 mg | INHALATION_SOLUTION | Freq: Once | RESPIRATORY_TRACT | Status: AC
  Administered 2014-08-29: 5 mg via RESPIRATORY_TRACT
  Filled 2014-08-29: qty 6

## 2014-08-29 MED ORDER — PREDNISONE 20 MG PO TABS: ORAL_TABLET | ORAL | Status: DC

## 2014-08-29 NOTE — Discharge Instructions (Signed)

## 2014-08-29 NOTE — ED Provider Notes (Signed)
CSN: [id number]     Arrival date & time 08/29/14  0102 History   First MD Initiated Contact with Patient 08/29/14 0251     Chief Complaint  Patient presents with  . Shortness of Breath     (Consider location/radiation/quality/duration/timing/severity/associated sxs/prior Treatment) Patient is a 48 y.o. female presenting with wheezing. The history is provided by the patient.  Wheezing Severity:  Moderate Severity compared to prior episodes:  Similar Onset quality:  Gradual Duration:  4 days Timing:  Constant Progression:  Unchanged Chronicity:  Recurrent Context: not animal exposure   Relieved by:  Nothing Worsened by:  Nothing tried Ineffective treatments:  Nebulizer treatments Associated symptoms: no chest pain and no rash   Risk factors: no suspected foreign body     Past Medical History  Diagnosis Date  . Asthma   . Thyroid disease   . Hypertension   . High cholesterol   . Migraine   . Morbid obesity   . H/O: hysterectomy   . Medical history non-contributory   . Hypothyroidism   . Anxiety   . Shortness of breath   . Stroke   . Arthritis   . Anemia   . Cancer    Past Surgical History  Procedure Laterality Date  . Cesarean section      3 occasions  . Thyroidectomy, partial      Goiter  . Cholecystectomy    . Abdominal hysterectomy     Family History  Problem Relation Age of Onset  . Migraines Sister    History  Substance Use Topics  . Smoking status: Current Every Day Smoker    Types: Cigarettes  . Smokeless tobacco: Never Used  . Alcohol Use: No   OB History   Grav Para Term Preterm Abortions TAB SAB Ect Mult Living                 Review of Systems  Respiratory: Positive for wheezing.   Cardiovascular: Negative for chest pain, palpitations and leg swelling.  Skin: Negative for rash.  All other systems reviewed and are negative.     Allergies  Mango flavor; Aspirin; Blueberry; and Penicillins  Home Medications   Prior to Admission  medications   Medication Sig Start Date End Date Taking? Authorizing Provider  albuterol (PROVENTIL HFA;VENTOLIN HFA) 108 (90 BASE) MCG/ACT inhaler Inhale 1 puff into the lungs every 4 (four) hours as needed for wheezing or shortness of breath. 05/29/14  Yes Theodis Blaze, MD  albuterol (PROVENTIL) (2.5 MG/3ML) 0.083% nebulizer solution Take 3 mLs (2.5 mg total) by nebulization every 4 (four) hours as needed for wheezing or shortness of breath. 05/29/14  Yes Theodis Blaze, MD  aspirin-acetaminophen-caffeine ([place]) [phone] MG per tablet Take 4 tablets by mouth every 6 (six) hours as needed for headache or migraine. 04/25/14  Yes Annita Brod, MD  fluticasone (FLOVENT HFA) 220 MCG/ACT inhaler Inhale 2 puffs into the lungs 2 (two) times daily. 11/05/13  Yes Erline Hau, MD  hydrochlorothiazide (HYDRODIURIL) 25 MG tablet Take 1 tablet (25 mg total) by mouth at bedtime. 04/25/14  Yes Annita Brod, MD  HYDROcodone-acetaminophen (NORCO/VICODIN) 5-325 MG per tablet Take 1-2 tabs every 4-6 hours as needed for pain. 04/25/14  Yes Annita Brod, MD  Ibuprofen-Diphenhydramine Cit (ADVIL PM PO) Take 3 tablets by mouth at bedtime.   Yes Historical Provider, MD  zolpidem (AMBIEN) 5 MG tablet Take 1 tablet (5 mg total) by mouth at bedtime as needed for  sleep. 04/25/14  Yes Annita Brod, MD   BP 133/82  Pulse 89  Temp(Src) 98.3 F (36.8 C) (Oral)  Resp 20  Ht 5\' 3"  (1.6 m)  Wt 280 lb (127.007 kg)  BMI 49.61 kg/m2  SpO2 98% Physical Exam  Constitutional: She is oriented to person, place, and time. She appears well-developed and well-nourished. No distress.  HENT:  Head: Normocephalic and atraumatic.  Mouth/Throat: Oropharynx is clear and moist.  Eyes: Conjunctivae and EOM are normal. Pupils are equal, round, and reactive to light.  Neck: Normal range of motion. Neck supple.  Cardiovascular: Normal rate, regular rhythm and intact distal pulses.   Pulmonary/Chest:  Effort normal. No stridor. No respiratory distress. She has wheezes. She has no rales. She exhibits no tenderness.  Abdominal: Soft. Bowel sounds are normal. There is no tenderness. There is no rebound and no guarding.  Musculoskeletal: Normal range of motion. She exhibits no edema and no tenderness.  Neurological: She is alert and oriented to person, place, and time.  Skin: Skin is warm and dry.  Psychiatric: She has a normal mood and affect.    ED Course  Procedures (including critical care time) Labs Review Labs Reviewed - No data to display  Imaging Review Dg Chest 2 View  08/29/2014   CLINICAL DATA:  Shortness of breath and wheezing.  EXAM: CHEST  2 VIEW  COMPARISON:  Chest radiograph performed 04/22/2014  FINDINGS: The lungs are well-aerated and clear. There is no evidence of focal opacification, pleural effusion or pneumothorax.  The heart is normal in size; the mediastinal contour is within normal limits. No acute osseous abnormalities are seen.  IMPRESSION: No acute cardiopulmonary process seen.   Electronically Signed   By: Garald Balding M.D.   On: 08/29/2014 02:09     EKG Interpretation None      MDM   Final diagnoses:  None    Also having a migraine and wants treatment for that.  Drove self here so cannot get sedating medications.    Clear post neb, will give inhaler here and prednisone RX.  Return for any worsening symptoms  Novalie Leamy K Janayia Burggraf-Rasch, MD 08/29/14 [phone]

## 2014-08-29 NOTE — ED Notes (Signed)
Pt used her nebulizer one hour ago and inhaler about 30 minutes prior to arrival

## 2014-08-29 NOTE — ED Notes (Signed)
Pt complains of being short of breath since 7pm, no relief with inhaler or neb treatments

## 2014-09-08 ENCOUNTER — Emergency Department (HOSPITAL_COMMUNITY)
Admission: EM | Admit: 2014-09-08 | Discharge: 2014-09-08 | Disposition: A | Discharge status: Home / Self Care | Payer: Self-pay | Attending: Emergency Medicine | Admitting: Emergency Medicine

## 2014-09-08 ENCOUNTER — Encounter (HOSPITAL_COMMUNITY): Payer: Self-pay | Admitting: Emergency Medicine

## 2014-09-08 DIAGNOSIS — J45901 Unspecified asthma with (acute) exacerbation: Secondary | ICD-10-CM | POA: Insufficient documentation

## 2014-09-08 DIAGNOSIS — M129 Arthropathy, unspecified: Secondary | ICD-10-CM | POA: Insufficient documentation

## 2014-09-08 DIAGNOSIS — Z8673 Personal history of transient ischemic attack (TIA), and cerebral infarction without residual deficits: Secondary | ICD-10-CM | POA: Insufficient documentation

## 2014-09-08 DIAGNOSIS — Z9089 Acquired absence of other organs: Secondary | ICD-10-CM | POA: Insufficient documentation

## 2014-09-08 DIAGNOSIS — A599 Trichomoniasis, unspecified: Secondary | ICD-10-CM | POA: Insufficient documentation

## 2014-09-08 DIAGNOSIS — J4540 Moderate persistent asthma, uncomplicated: Secondary | ICD-10-CM

## 2014-09-08 DIAGNOSIS — Z9889 Other specified postprocedural states: Secondary | ICD-10-CM | POA: Insufficient documentation

## 2014-09-08 DIAGNOSIS — IMO0002 Reserved for concepts with insufficient information to code with codable children: Secondary | ICD-10-CM | POA: Insufficient documentation

## 2014-09-08 DIAGNOSIS — Z8541 Personal history of malignant neoplasm of cervix uteri: Secondary | ICD-10-CM | POA: Insufficient documentation

## 2014-09-08 DIAGNOSIS — I10 Essential (primary) hypertension: Secondary | ICD-10-CM | POA: Insufficient documentation

## 2014-09-08 DIAGNOSIS — Z79899 Other long term (current) drug therapy: Secondary | ICD-10-CM | POA: Insufficient documentation

## 2014-09-08 DIAGNOSIS — Z862 Personal history of diseases of the blood and blood-forming organs and certain disorders involving the immune mechanism: Secondary | ICD-10-CM | POA: Insufficient documentation

## 2014-09-08 DIAGNOSIS — N898 Other specified noninflammatory disorders of vagina: Secondary | ICD-10-CM | POA: Insufficient documentation

## 2014-09-08 DIAGNOSIS — Z880 Allergy status to penicillin: Secondary | ICD-10-CM | POA: Insufficient documentation

## 2014-09-08 DIAGNOSIS — Z8639 Personal history of other endocrine, nutritional and metabolic disease: Secondary | ICD-10-CM | POA: Insufficient documentation

## 2014-09-08 DIAGNOSIS — Z90710 Acquired absence of both cervix and uterus: Secondary | ICD-10-CM | POA: Insufficient documentation

## 2014-09-08 DIAGNOSIS — Z8659 Personal history of other mental and behavioral disorders: Secondary | ICD-10-CM | POA: Insufficient documentation

## 2014-09-08 LAB — URINALYSIS, ROUTINE W REFLEX MICROSCOPIC
Bilirubin Urine: NEGATIVE
GLUCOSE, UA: NEGATIVE mg/dL
Ketones, ur: NEGATIVE mg/dL
Nitrite: NEGATIVE
PROTEIN: NEGATIVE mg/dL
Specific Gravity, Urine: 1.014 (ref 1.005–1.030)
Urobilinogen, UA: 0.2 mg/dL (ref 0.0–1.0)
pH: 6 (ref 5.0–8.0)

## 2014-09-08 LAB — POC OCCULT BLOOD, ED: Fecal Occult Bld: POSITIVE — AB

## 2014-09-08 LAB — WET PREP, GENITAL
WBC, Wet Prep HPF POC: NONE SEEN
Yeast Wet Prep HPF POC: NONE SEEN

## 2014-09-08 LAB — URINE MICROSCOPIC-ADD ON

## 2014-09-08 MED ORDER — ALBUTEROL SULFATE (2.5 MG/3ML) 0.083% IN NEBU
5.0000 mg | INHALATION_SOLUTION | Freq: Once | RESPIRATORY_TRACT | Status: AC
  Administered 2014-09-08: 5 mg via RESPIRATORY_TRACT
  Filled 2014-09-08: qty 6

## 2014-09-08 MED ORDER — BUDESONIDE 90 MCG/ACT IN AEPB: 1.0000 | INHALATION_SPRAY | Freq: Two times a day (BID) | RESPIRATORY_TRACT | Status: DC

## 2014-09-08 MED ORDER — ALBUTEROL SULFATE HFA 108 (90 BASE) MCG/ACT IN AERS: 2.0000 | INHALATION_SPRAY | RESPIRATORY_TRACT | Status: DC | PRN

## 2014-09-08 MED ORDER — PREDNISONE 20 MG PO TABS: 40.0000 mg | ORAL_TABLET | Freq: Every day | ORAL | Status: DC

## 2014-09-08 MED ORDER — IPRATROPIUM BROMIDE 0.02 % IN SOLN
0.5000 mg | Freq: Once | RESPIRATORY_TRACT | Status: AC
  Administered 2014-09-08: 0.5 mg via RESPIRATORY_TRACT
  Filled 2014-09-08: qty 2.5

## 2014-09-08 MED ORDER — PREDNISONE 20 MG PO TABS
60.0000 mg | ORAL_TABLET | Freq: Once | ORAL | Status: AC
  Administered 2014-09-08: 60 mg via ORAL
  Filled 2014-09-08: qty 3

## 2014-09-08 MED ORDER — METRONIDAZOLE 500 MG PO TABS
2000.0000 mg | ORAL_TABLET | ORAL | Status: AC
  Administered 2014-09-08: 2000 mg via ORAL
  Filled 2014-09-08: qty 4

## 2014-09-08 MED ORDER — ALBUTEROL SULFATE HFA 108 (90 BASE) MCG/ACT IN AERS
2.0000 | INHALATION_SPRAY | Freq: Once | RESPIRATORY_TRACT | Status: DC
  Filled 2014-09-08: qty 6.7

## 2014-09-08 NOTE — ED Provider Notes (Signed)
Complaint of  Scant bleeding from vagina noted when she wiped herself afterr urinating today. Also complains of wheezing typical of asthma she's had in the past. Her breathing is at baseline after treatment in the emergency department. Patient wheezes at baseline. On exam no distress speaks in paragraphs lungs with end expiratory wheezes abdomen obese nontender  Orlie Dakin, MD 09/08/14 1943

## 2014-09-08 NOTE — ED Notes (Signed)
Pt states that she had little bleeding earlier this week then yesterday had bright vaginal bleeding when she would wipe.  Pt denies having to wear a pad due to bleeding amount but had partial hysterectomy 4-5 years ago and dont understand why she is bleeding. Pt denies any pain at this time.

## 2014-09-08 NOTE — ED Notes (Signed)
Pt ambulated to BR and back to room w/o assistance 

## 2014-09-08 NOTE — ED Provider Notes (Signed)
CSN: [id number]     Arrival date & time 09/08/14  1711 History   First MD Initiated Contact with Patient 09/08/14 1721     Chief Complaint  Patient presents with  . Vaginal Bleeding   (Consider location/radiation/quality/duration/timing/severity/associated sxs/prior Treatment) HPI Wendy Hoffman is a 48 yo female presenting with vaginal spotting x 5 days.  She reports it has never been enough to stain her underwear or need a pad, but she notices red blood every time she wipes after using the bathroom.  She reports she has not had sex recently and no other trauma to that area.  She denies any pain.  Her last bowel movement was today and it was soft.  She has had a hysterectomy in 2009 but still has her ovaries. She denies fever, chills, vomiting, diarrhea or abd pain.     Past Medical History  Diagnosis Date  . Asthma   . Thyroid disease   . Hypertension   . High cholesterol   . Migraine   . Morbid obesity   . H/O: hysterectomy   . Medical history non-contributory   . Hypothyroidism   . Anxiety   . Shortness of breath   . Stroke   . Arthritis   . Anemia   . Cancer    Past Surgical History  Procedure Laterality Date  . Cesarean section      3 occasions  . Thyroidectomy, partial      Goiter  . Cholecystectomy    . Abdominal hysterectomy     Family History  Problem Relation Age of Onset  . Migraines Sister    History  Substance Use Topics  . Smoking status: Former Smoker    Types: Cigarettes    Quit date: 08/09/2014  . Smokeless tobacco: Never Used  . Alcohol Use: No   OB History   Grav Para Term Preterm Abortions TAB SAB Ect Mult Living                 Review of Systems  Constitutional: Negative for fever and chills.  HENT: Negative for sore throat.   Eyes: Negative for visual disturbance.  Respiratory: Positive for wheezing. Negative for cough and shortness of breath.   Cardiovascular: Negative for chest pain and leg swelling.  Gastrointestinal: Negative  for nausea, vomiting and diarrhea.  Genitourinary: Positive for vaginal bleeding. Negative for dysuria, vaginal discharge and vaginal pain.  Musculoskeletal: Negative for myalgias.  Skin: Negative for rash.  Neurological: Negative for weakness, numbness and headaches.    Allergies  Mango flavor; Aspirin; Blueberry; and Penicillins  Home Medications   Prior to Admission medications   Medication Sig Start Date End Date Taking? Authorizing Provider  albuterol (PROVENTIL HFA;VENTOLIN HFA) 108 (90 BASE) MCG/ACT inhaler Inhale 1 puff into the lungs every 4 (four) hours as needed for wheezing or shortness of breath. 05/29/14   Theodis Blaze, MD  albuterol (PROVENTIL) (2.5 MG/3ML) 0.083% nebulizer solution Take 3 mLs (2.5 mg total) by nebulization every 4 (four) hours as needed for wheezing or shortness of breath. 05/29/14   Theodis Blaze, MD  aspirin-acetaminophen-caffeine (EXCEDRIN MIGRAINE) [phone] MG per tablet Take 4 tablets by mouth every 6 (six) hours as needed for headache or migraine. 04/25/14   Annita Brod, MD  fluticasone (FLOVENT HFA) 220 MCG/ACT inhaler Inhale 2 puffs into the lungs 2 (two) times daily. 11/05/13   Erline Hau, MD  hydrochlorothiazide (HYDRODIURIL) 25 MG tablet Take 1 tablet (25 mg total) by mouth at  bedtime. 04/25/14   Annita Brod, MD  HYDROcodone-acetaminophen (NORCO/VICODIN) 5-325 MG per tablet Take 1-2 tabs every 4-6 hours as needed for pain. 04/25/14   Annita Brod, MD  Ibuprofen-Diphenhydramine Cit (ADVIL PM PO) Take 3 tablets by mouth at bedtime.    Historical Provider, MD  predniSONE (DELTASONE) 20 MG tablet 3 tabs po day one, then 2 po daily x 4 days 08/29/14   April K Palumbo-Rasch, MD  zolpidem (AMBIEN) 5 MG tablet Take 1 tablet (5 mg total) by mouth at bedtime as needed for sleep. 04/25/14   Annita Brod, MD   BP 150/91  Pulse 90  Temp(Src) 98.1 F (36.7 C) (Oral)  Resp 18  SpO2 100% Physical Exam  Nursing note and vitals  reviewed. Constitutional: She is oriented to person, place, and time. She appears well-developed and well-nourished. No distress.  HENT:  Head: Normocephalic and atraumatic.  Mouth/Throat: Oropharynx is clear and moist. No oropharyngeal exudate.  Eyes: Conjunctivae are normal.  Neck: Neck supple. No thyromegaly present.  Cardiovascular: Normal rate, regular rhythm and intact distal pulses.   Pulmonary/Chest: Effort normal. Not tachypneic. No respiratory distress. She has no decreased breath sounds. She has wheezes in the right middle field, the right lower field, the left middle field and the left lower field. She has no rhonchi. She has no rales. She exhibits no tenderness.  Abdominal: Soft. Bowel sounds are normal. She exhibits no distension and no mass. There is no tenderness. There is no rebound and no guarding.  Genitourinary: Vagina normal. Rectal exam shows internal hemorrhoid. Rectal exam shows no external hemorrhoid, no mass, no tenderness and anal tone normal. Guaiac positive stool. Right adnexum displays no tenderness. Left adnexum displays no tenderness. No vaginal discharge found.  Pt has had a hysterectomy, cervical stump noted.  No tenderness with pelvic or bimanual exam.  No bleeding noted.  Internal hemorrhoid palpated.  Musculoskeletal: She exhibits no tenderness.  Lymphadenopathy:    She has no cervical adenopathy.  Neurological: She is alert and oriented to person, place, and time.  Skin: Skin is warm and dry. No rash noted. She is not diaphoretic.  Psychiatric: She has a normal mood and affect.    ED Course  Procedures (including critical care time) Labs Review Labs Reviewed  WET PREP, GENITAL - Abnormal; Notable for the following:    Trich, Wet Prep MODERATE (*)    Clue Cells Wet Prep HPF POC FEW (*)    All other components within normal limits  URINALYSIS, ROUTINE W REFLEX MICROSCOPIC - Abnormal; Notable for the following:    APPearance CLOUDY (*)    Hgb urine  dipstick TRACE (*)    Leukocytes, UA LARGE (*)    All other components within normal limits  URINE MICROSCOPIC-ADD ON - Abnormal; Notable for the following:    Squamous Epithelial / LPF FEW (*)    Bacteria, UA FEW (*)    All other components within normal limits  POC OCCULT BLOOD, ED - Abnormal; Notable for the following:    Fecal Occult Bld POSITIVE (*)    All other components within normal limits  GC/CHLAMYDIA PROBE AMP   Imaging Review No results found.   EKG Interpretation None      MDM   Final diagnoses:  Trichomoniasis  Asthma, moderate persistent, uncomplicated   48 yo female with report of vaginal bleeding despite hysterectomy 5 yrs ago.  I&O UA: trace hgb, large leukocytes, pelvic exam: no visualized bleeding, positive trich and rectal  exam: positive occult blood, and palpable internal hemorrhoid.  Neb tx given for wheezing   6:41 PM Improved air movement, still wheezing however,  Repeat neb tx and prednisone  On repeat exam, only mild wheezes auscultated and good air movement.  Pt reports feeling well, no acute distress, vital signs stable.  Flagyl given in the ED for trich infection.    Patient appears safe to be discharged.  Discharge instructions include resources to establish care to  follow up with an OBGYN. Discussed importance of using protection when sexually active. Pt understands that they have GC/Chlamydia cultures pending and that they will need to inform all sexual partners if results return positive. Pt has been advised to not drink alcohol because of treatment with Flagyl.   Pt will also need to follow-up with PCP for asthma management.  Rescue albuterol inhaler provided with instructions to use 2 puffs every 4 hours and return to ED if need more often.  Prescription provided for ICS inhaler for bid use.   Pt voices understanding and in agreement with plan. Return precautions provided.    Filed Vitals:   09/08/14 1717 09/08/14 1950  BP: 150/91 141/79   Pulse: 90 93  Temp: 98.1 F (36.7 C)   TempSrc: Oral   Resp: 18 19  SpO2: 100% 100%   Meds given in ED:  Medications  albuterol (PROVENTIL) (2.5 MG/3ML) 0.083% nebulizer solution 5 mg (5 mg Nebulization Given 09/08/14 1800)  ipratropium (ATROVENT) nebulizer solution 0.5 mg (0.5 mg Nebulization Given 09/08/14 1800)  albuterol (PROVENTIL) (2.5 MG/3ML) 0.083% nebulizer solution 5 mg (5 mg Nebulization Given 09/08/14 1840)  predniSONE (DELTASONE) tablet 60 mg (60 mg Oral Given 09/08/14 1900)  metroNIDAZOLE (FLAGYL) tablet 2,000 mg (2,000 mg Oral Given 09/08/14 2002)    Discharge Medication List as of 09/08/2014  7:37 PM    START taking these medications   Details  !! albuterol (PROVENTIL HFA;VENTOLIN HFA) 108 (90 BASE) MCG/ACT inhaler Inhale 2 puffs into the lungs every 4 (four) hours as needed for wheezing or shortness of breath (If you need it more often than this you should come to the ER for eval)., Starting 09/08/2014, Until Discontinued, Print    Budesonide 90 MCG/ACT inhaler Inhale 1 puff into the lungs 2 (two) times daily., Starting 09/08/2014, Until Discontinued, Print    !! predniSONE (DELTASONE) 20 MG tablet Take 2 tablets (40 mg total) by mouth daily., Starting 09/09/2014, Until Discontinued, Print     !! - Potential duplicate medications found. Please discuss with provider.           Britt Bottom, NP 09/12/14 [phone]

## 2014-09-08 NOTE — Discharge Instructions (Signed)
Please follow the directions provided.  Be sure to follow up this week with your primary care provider.  Use the steroid inhaler daily.  Use the albuterol inhaler only as needed for wheezing.  You may use  2 puffs every 4 hours for wheezing but if your need it more frequently than that you need to come in to the ED.    SEEK MEDICAL CARE IF:  You still have symptoms after you finish your medicine.  You develop abdominal pain.  You have pain when you urinate.  You have bleeding after sexual intercourse.  You develop a rash.  Your medicine makes you sick or makes you throw up (vomit).   SEEK IMMEDIATE MEDICAL CARE IF:  You seem to be getting worse and are unresponsive to treatment during an asthma attack.  You are short of breath even at rest.  You get short of breath when doing very little physical activity.  You have difficulty eating, drinking, or talking due to asthma symptoms.  You develop chest pain.  You develop a fast heartbeat.  You have a bluish color to your lips or fingernails.  You are light-headed, dizzy, or faint.  Your peak flow is less than 50% of your personal best.

## 2014-09-09 ENCOUNTER — Emergency Department (HOSPITAL_COMMUNITY)
Admission: EM | Admit: 2014-09-09 | Discharge: 2014-09-09 | Disposition: A | Discharge status: Home / Self Care | Payer: Self-pay | Attending: Emergency Medicine | Admitting: Emergency Medicine

## 2014-09-09 ENCOUNTER — Encounter (HOSPITAL_COMMUNITY): Payer: Self-pay | Admitting: Emergency Medicine

## 2014-09-09 DIAGNOSIS — Y9389 Activity, other specified: Secondary | ICD-10-CM | POA: Insufficient documentation

## 2014-09-09 DIAGNOSIS — Z79899 Other long term (current) drug therapy: Secondary | ICD-10-CM | POA: Insufficient documentation

## 2014-09-09 DIAGNOSIS — I10 Essential (primary) hypertension: Secondary | ICD-10-CM | POA: Insufficient documentation

## 2014-09-09 DIAGNOSIS — M129 Arthropathy, unspecified: Secondary | ICD-10-CM | POA: Insufficient documentation

## 2014-09-09 DIAGNOSIS — S0993XA Unspecified injury of face, initial encounter: Secondary | ICD-10-CM | POA: Insufficient documentation

## 2014-09-09 DIAGNOSIS — S301XXA Contusion of abdominal wall, initial encounter: Secondary | ICD-10-CM | POA: Insufficient documentation

## 2014-09-09 DIAGNOSIS — T148XXA Other injury of unspecified body region, initial encounter: Secondary | ICD-10-CM

## 2014-09-09 DIAGNOSIS — Z862 Personal history of diseases of the blood and blood-forming organs and certain disorders involving the immune mechanism: Secondary | ICD-10-CM | POA: Insufficient documentation

## 2014-09-09 DIAGNOSIS — Z8673 Personal history of transient ischemic attack (TIA), and cerebral infarction without residual deficits: Secondary | ICD-10-CM | POA: Insufficient documentation

## 2014-09-09 DIAGNOSIS — M542 Cervicalgia: Secondary | ICD-10-CM

## 2014-09-09 DIAGNOSIS — F17200 Nicotine dependence, unspecified, uncomplicated: Secondary | ICD-10-CM | POA: Insufficient documentation

## 2014-09-09 DIAGNOSIS — Z8659 Personal history of other mental and behavioral disorders: Secondary | ICD-10-CM | POA: Insufficient documentation

## 2014-09-09 DIAGNOSIS — S199XXA Unspecified injury of neck, initial encounter: Principal | ICD-10-CM

## 2014-09-09 DIAGNOSIS — J45909 Unspecified asthma, uncomplicated: Secondary | ICD-10-CM | POA: Insufficient documentation

## 2014-09-09 DIAGNOSIS — M62838 Other muscle spasm: Secondary | ICD-10-CM

## 2014-09-09 DIAGNOSIS — Z880 Allergy status to penicillin: Secondary | ICD-10-CM | POA: Insufficient documentation

## 2014-09-09 DIAGNOSIS — G43909 Migraine, unspecified, not intractable, without status migrainosus: Secondary | ICD-10-CM | POA: Insufficient documentation

## 2014-09-09 DIAGNOSIS — Z8541 Personal history of malignant neoplasm of cervix uteri: Secondary | ICD-10-CM | POA: Insufficient documentation

## 2014-09-09 DIAGNOSIS — Y92410 Unspecified street and highway as the place of occurrence of the external cause: Secondary | ICD-10-CM | POA: Insufficient documentation

## 2014-09-09 DIAGNOSIS — IMO0002 Reserved for concepts with insufficient information to code with codable children: Secondary | ICD-10-CM | POA: Insufficient documentation

## 2014-09-09 DIAGNOSIS — Z90710 Acquired absence of both cervix and uterus: Secondary | ICD-10-CM | POA: Insufficient documentation

## 2014-09-09 LAB — GC/CHLAMYDIA PROBE AMP
CT Probe RNA: NEGATIVE
GC PROBE AMP APTIMA: NEGATIVE

## 2014-09-09 MED ORDER — CYCLOBENZAPRINE HCL 10 MG PO TABS: 10.0000 mg | ORAL_TABLET | Freq: Three times a day (TID) | ORAL | Status: DC | PRN

## 2014-09-09 MED ORDER — MORPHINE SULFATE 4 MG/ML IJ SOLN
4.0000 mg | Freq: Once | INTRAMUSCULAR | Status: AC
  Administered 2014-09-09: 4 mg via INTRAMUSCULAR
  Filled 2014-09-09: qty 1

## 2014-09-09 MED ORDER — NAPROXEN 500 MG PO TABS: 500.0000 mg | ORAL_TABLET | Freq: Two times a day (BID) | ORAL | Status: DC | PRN

## 2014-09-09 MED ORDER — OXYCODONE-ACETAMINOPHEN 5-325 MG PO TABS: 1.0000 | ORAL_TABLET | Freq: Four times a day (QID) | ORAL | Status: DC | PRN
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
## 2014-09-09 NOTE — Discharge Instructions (Signed)
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 throw up.  You have pain in your shoulder (shoulder strap  areas).  Your problems are getting worse. MAKE SURE YOU:   Understand these instructions.  Will watch your condition.  Will get help right away if you are not doing well or get worse. Document Released: 05/17/2008 Document Revised: 02/21/2012 Document Reviewed: 04/28/2011 [facility] Patient Information 2015 [place], [place]. This information is not intended to replace advice given to you by your health care provider. Make sure you discuss any questions you have with your health care provider.  Contusion A contusion is a deep bruise. Contusions happen when an injury causes bleeding under the skin. Signs of bruising include pain, puffiness (swelling), and discolored skin. The contusion may turn blue, purple, or yellow. HOME CARE   Put ice on the injured area.  Put ice in a plastic bag.  Place a towel between your skin and the bag.  Leave the ice on for 15-20 minutes, 03-04 times  a day.  Only take medicine as told by your doctor.  Rest the injured area.  If possible, raise (elevate) the injured area to lessen puffiness. GET HELP RIGHT AWAY IF:   You have more bruising or puffiness.  You have pain that is getting worse.  Your puffiness or pain is not helped by medicine. MAKE SURE YOU:   Understand these instructions.  Will watch your condition.  Will get help right away if you are not doing well or get worse. Document Released: 05/17/2008 Document Revised: 02/21/2012 Document Reviewed: 10/04/2011 [facility] Patient Information 2015 [place], [place]. This information is not intended to replace advice given to you by your health care provider. Make sure you discuss any questions you have with your health care provider.
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
## 2014-09-11 NOTE — ED Provider Notes (Signed)
Medical screening examination/treatment/procedure(s) were performed by non-physician practitioner and as supervising physician I was immediately available for consultation/collaboration.   EKG Interpretation None        Debby Freiberg, MD 09/11/14 1137

## 2014-09-12 NOTE — ED Provider Notes (Signed)
Medical screening examination/treatment/procedure(s) were conducted as a shared visit with non-physician practitioner(s) and myself.  I personally evaluated the patient during the encounter.   EKG Interpretation None       Orlie Dakin, MD 09/12/14 [phone]

## 2014-11-24 ENCOUNTER — Emergency Department (HOSPITAL_COMMUNITY)
Admission: EM | Admit: 2014-11-24 | Discharge: 2014-11-24 | Disposition: A | Discharge status: Home / Self Care | Payer: Self-pay | Attending: Emergency Medicine | Admitting: Emergency Medicine

## 2014-11-24 ENCOUNTER — Emergency Department (HOSPITAL_COMMUNITY): Payer: Self-pay

## 2014-11-24 ENCOUNTER — Encounter (HOSPITAL_COMMUNITY): Payer: Self-pay | Admitting: *Deleted

## 2014-11-24 DIAGNOSIS — Z791 Long term (current) use of non-steroidal anti-inflammatories (NSAID): Secondary | ICD-10-CM | POA: Insufficient documentation

## 2014-11-24 DIAGNOSIS — G43909 Migraine, unspecified, not intractable, without status migrainosus: Secondary | ICD-10-CM | POA: Insufficient documentation

## 2014-11-24 DIAGNOSIS — Z8673 Personal history of transient ischemic attack (TIA), and cerebral infarction without residual deficits: Secondary | ICD-10-CM | POA: Insufficient documentation

## 2014-11-24 DIAGNOSIS — R0602 Shortness of breath: Secondary | ICD-10-CM

## 2014-11-24 DIAGNOSIS — I10 Essential (primary) hypertension: Secondary | ICD-10-CM | POA: Insufficient documentation

## 2014-11-24 DIAGNOSIS — Z8659 Personal history of other mental and behavioral disorders: Secondary | ICD-10-CM | POA: Insufficient documentation

## 2014-11-24 DIAGNOSIS — Z79899 Other long term (current) drug therapy: Secondary | ICD-10-CM | POA: Insufficient documentation

## 2014-11-24 DIAGNOSIS — J45901 Unspecified asthma with (acute) exacerbation: Secondary | ICD-10-CM | POA: Insufficient documentation

## 2014-11-24 DIAGNOSIS — Z862 Personal history of diseases of the blood and blood-forming organs and certain disorders involving the immune mechanism: Secondary | ICD-10-CM | POA: Insufficient documentation

## 2014-11-24 DIAGNOSIS — M1990 Unspecified osteoarthritis, unspecified site: Secondary | ICD-10-CM | POA: Insufficient documentation

## 2014-11-24 DIAGNOSIS — Z880 Allergy status to penicillin: Secondary | ICD-10-CM | POA: Insufficient documentation

## 2014-11-24 DIAGNOSIS — R0682 Tachypnea, not elsewhere classified: Secondary | ICD-10-CM | POA: Insufficient documentation

## 2014-11-24 DIAGNOSIS — Z720 Tobacco use: Secondary | ICD-10-CM | POA: Insufficient documentation

## 2014-11-24 DIAGNOSIS — Z8541 Personal history of malignant neoplasm of cervix uteri: Secondary | ICD-10-CM | POA: Insufficient documentation

## 2014-11-24 LAB — BASIC METABOLIC PANEL
ANION GAP: 15 (ref 5–15)
BUN: 9 mg/dL (ref 6–23)
CHLORIDE: 104 meq/L (ref 96–112)
CO2: 27 meq/L (ref 19–32)
Calcium: 9.4 mg/dL (ref 8.4–10.5)
Creatinine, Ser: 0.73 mg/dL (ref 0.50–1.10)
GFR calc Af Amer: >90 mL/min (ref >90)
GFR calc non Af Amer: >90 mL/min (ref >90)
Glucose, Bld: 118 mg/dL — ABNORMAL HIGH (ref 70–99)
Potassium: 3.1 mEq/L — ABNORMAL LOW (ref 3.7–5.3)
Sodium: 146 mEq/L (ref 137–147)

## 2014-11-24 LAB — CBC WITH DIFFERENTIAL/PLATELET
Basophils Absolute: 0 10*3/uL (ref 0.0–0.1)
Basophils Relative: 0 % (ref 0–1)
Eosinophils Absolute: 0.2 10*3/uL (ref 0.0–0.7)
Eosinophils Relative: 2 % (ref 0–5)
HEMATOCRIT: 39.6 % (ref 36.0–46.0)
HEMOGLOBIN: 13.1 g/dL (ref 12.0–15.0)
LYMPHS PCT: 18 % (ref 12–46)
Lymphs Abs: 1.6 10*3/uL (ref 0.7–4.0)
MCH: 30.2 pg (ref 26.0–34.0)
MCHC: 33.1 g/dL (ref 30.0–36.0)
MCV: 91.2 fL (ref 78.0–100.0)
MONOS PCT: 4 % (ref 3–12)
Monocytes Absolute: 0.3 10*3/uL (ref 0.1–1.0)
NEUTROS ABS: 6.8 10*3/uL (ref 1.7–7.7)
Neutrophils Relative %: 76 % (ref 43–77)
Platelets: 293 10*3/uL (ref 150–400)
RBC: 4.34 MIL/uL (ref 3.87–5.11)
RDW: 13.5 % (ref 11.5–15.5)
WBC: 8.9 10*3/uL (ref 4.0–10.5)

## 2014-11-24 MED ORDER — ALBUTEROL SULFATE HFA 108 (90 BASE) MCG/ACT IN AERS: 2.0000 | INHALATION_SPRAY | RESPIRATORY_TRACT | Status: DC | PRN

## 2014-11-24 MED ORDER — IBUPROFEN 200 MG PO TABS
600.0000 mg | ORAL_TABLET | Freq: Once | ORAL | Status: AC
  Administered 2014-11-24: 600 mg via ORAL
  Filled 2014-11-24: qty 3

## 2014-11-24 MED ORDER — METHYLPREDNISOLONE SODIUM SUCC 125 MG IJ SOLR
125.0000 mg | Freq: Once | INTRAMUSCULAR | Status: AC
  Administered 2014-11-24: 125 mg via INTRAVENOUS
  Filled 2014-11-24: qty 2

## 2014-11-24 MED ORDER — PREDNISONE 10 MG PO TABS: 60.0000 mg | ORAL_TABLET | Freq: Every day | ORAL | Status: AC

## 2014-11-24 MED ORDER — ALBUTEROL SULFATE HFA 108 (90 BASE) MCG/ACT IN AERS
INHALATION_SPRAY | RESPIRATORY_TRACT | Status: AC
  Filled 2014-11-24: qty 6.7

## 2014-11-24 MED ORDER — ALBUTEROL (5 MG/ML) CONTINUOUS INHALATION SOLN
10.0000 mg/h | INHALATION_SOLUTION | RESPIRATORY_TRACT | Status: DC
  Administered 2014-11-24: 10 mg/h via RESPIRATORY_TRACT

## 2014-11-24 MED ORDER — ALBUTEROL SULFATE HFA 108 (90 BASE) MCG/ACT IN AERS
2.0000 | INHALATION_SPRAY | Freq: Once | RESPIRATORY_TRACT | Status: AC
  Administered 2014-11-24: 2 via RESPIRATORY_TRACT

## 2014-11-24 MED ORDER — ALBUTEROL (5 MG/ML) CONTINUOUS INHALATION SOLN
INHALATION_SOLUTION | RESPIRATORY_TRACT | Status: AC
  Filled 2014-11-24: qty 20

## 2014-11-24 MED ORDER — ALBUTEROL SULFATE (2.5 MG/3ML) 0.083% IN NEBU
5.0000 mg | INHALATION_SOLUTION | Freq: Once | RESPIRATORY_TRACT | Status: AC
  Administered 2014-11-24: 5 mg via RESPIRATORY_TRACT
  Filled 2014-11-24: qty 6

## 2014-11-24 NOTE — Discharge Instructions (Signed)

## 2014-11-24 NOTE — ED Notes (Signed)
Speaking in short phrases.  Pt SOB since Friday. Sts quit smoking about 6 months ago. 97% on RA.  Currently getting breathing tx. States feels like there is a elephant sitting on head and chest.

## 2014-11-24 NOTE — ED Notes (Signed)
Patient states she feels better. Talks in complete sentences and laughing with family members.

## 2014-11-24 NOTE — ED Notes (Signed)
Patient has a hx of asthma and stated she began having an exacerbation Friday past.  Patient has expiratory and inspiratory wheezing that is audible without a stethoscope.  Patient also has a non-productive cough and c/o a fever of 101.6 last night.  Patient is current afebrile.  Patient took Albuterol nebulized @ 11 am today with no relief.  She has also been using a rescue inhaler (Albuterol) to treat.  Patient does not have an Rx for a LABA or ICS.

## 2014-11-24 NOTE — ED Notes (Signed)
When entering the room, patient was sitting on stretcher eating soup. Appears in no respiratory distress.

## 2014-11-24 NOTE — ED Notes (Signed)
Notified RT about the neb treatments.

## 2014-11-24 NOTE — ED Notes (Signed)
Went to draw labs on pt. Pt requested she wanted to wait for iv

## 2014-11-24 NOTE — ED Provider Notes (Signed)
CSN: [id number]     Arrival date & time 11/24/14  1724 History   First MD Initiated Contact with Patient 11/24/14 1748     Chief Complaint  Patient presents with  . Shortness of Breath     (Consider location/radiation/quality/duration/timing/severity/associated sxs/prior Treatment) Patient is a 48 y.o. female presenting with shortness of breath.  Shortness of Breath Severity:  Severe Onset quality:  Gradual Duration:  2 days Timing:  Constant Progression:  Worsening Chronicity:  Recurrent Context comment:  History of asthma "I think I'm getting a cold" Relieved by:  Nothing Worsened by:  Nothing tried Associated symptoms: chest pain (Tightness), cough and sore throat (mild)   Associated symptoms: no fever and no sputum production   Associated symptoms comment:  No nasal congestion.   Past Medical History  Diagnosis Date  . Asthma   . Thyroid disease   . Hypertension   . High cholesterol   . Migraine   . Morbid obesity   . H/O: hysterectomy   . Medical history non-contributory   . Hypothyroidism   . Anxiety   . Shortness of breath   . Stroke   . Arthritis   . Anemia   . Cancer     cervic   Past Surgical History  Procedure Laterality Date  . Cesarean section      3 occasions  . Thyroidectomy, partial      Goiter  . Cholecystectomy    . Abdominal hysterectomy     Family History  Problem Relation Age of Onset  . Migraines Sister    History  Substance Use Topics  . Smoking status: Light Tobacco Smoker    Types: Cigarettes  . Smokeless tobacco: Never Used  . Alcohol Use: No   OB History    No data available     Review of Systems  Constitutional: Negative for fever.  HENT: Positive for sore throat (mild).   Respiratory: Positive for cough and shortness of breath. Negative for sputum production.   Cardiovascular: Positive for chest pain (Tightness).  All other systems reviewed and are negative.     Allergies  Mango flavor; Aspirin; Blueberry;  and Penicillins  Home Medications   Prior to Admission medications   Medication Sig Start Date End Date Taking? Authorizing Provider  albuterol (PROVENTIL HFA;VENTOLIN HFA) 108 (90 BASE) MCG/ACT inhaler Inhale 2 puffs into the lungs every 6 (six) hours as needed for wheezing or shortness of breath (wheezing).   Yes Historical Provider, MD  albuterol (PROVENTIL) (2.5 MG/3ML) 0.083% nebulizer solution Take 2.5 mg by nebulization every 6 (six) hours as needed for wheezing or shortness of breath (wheezing).   Yes Historical Provider, MD  cyclobenzaprine (FLEXERIL) 10 MG tablet Take 1 tablet (10 mg total) by mouth 3 (three) times daily as needed for muscle spasms. 09/09/14  Yes Mercedes Strupp Camprubi-Soms, PA-C  hydrochlorothiazide (HYDRODIURIL) 25 MG tablet Take 1 tablet (25 mg total) by mouth at bedtime. 04/25/14  Yes Annita Brod, MD  Ibuprofen-Diphenhydramine Cit (ADVIL PM PO) Take 4 tablets by mouth at bedtime.    Yes Historical Provider, MD  naproxen (NAPROSYN) 500 MG tablet Take 1 tablet (500 mg total) by mouth 2 (two) times daily as needed for mild pain, moderate pain or headache (TAKE WITH MEALS.). 09/09/14  Yes Mercedes Strupp Camprubi-Soms, PA-C  oxyCODONE-acetaminophen (PERCOCET) 5-325 MG per tablet Take 1-2 tablets by mouth every 6 (six) hours as needed for severe pain. 09/09/14  Yes Mercedes Strupp Camprubi-Soms, PA-C   BP 136/74 mmHg  Pulse 95  Temp(Src) 98.8 F (37.1 C) (Oral)  Resp 30  SpO2 95% Physical Exam  Constitutional: She is oriented to person, place, and time. She appears well-developed and well-nourished. No distress.  HENT:  Head: Normocephalic and atraumatic.  Mouth/Throat: Oropharynx is clear and moist.  Eyes: Conjunctivae are normal. Pupils are equal, round, and reactive to light. No scleral icterus.  Neck: Neck supple.  Cardiovascular: Normal rate, regular rhythm, normal heart sounds and intact distal pulses.   No murmur heard. Pulmonary/Chest: No stridor.  Tachypnea noted. She is in respiratory distress. She has wheezes (diffuse.  Poor air movement throughout.). She has no rales.  Abdominal: Soft. Bowel sounds are normal. She exhibits no distension. There is no tenderness.  Musculoskeletal: Normal range of motion.  Neurological: She is alert and oriented to person, place, and time.  Skin: Skin is warm and dry. No rash noted.  Psychiatric: She has a normal mood and affect. Her behavior is normal.  Nursing note and vitals reviewed.   ED Course  Procedures (including critical care time) Labs Review Labs Reviewed  BASIC METABOLIC PANEL - Abnormal; Notable for the following:    Potassium 3.1 (*)    Glucose, Bld 118 (*)    All other components within normal limits  CBC WITH DIFFERENTIAL    Imaging Review Dg Chest Port 1 View  11/24/2014   CLINICAL DATA:  Initial evaluation for shortness of breath cough chills today, personal history of asthma  EXAM: PORTABLE CHEST - 1 VIEW  COMPARISON:  08/29/2014  FINDINGS: The heart size and mediastinal contours are within normal limits. Both lungs are clear. The visualized skeletal structures are unremarkable.  IMPRESSION: No active disease.   Electronically Signed   By: Skipper Cliche M.D.   On: 11/24/2014 19:12  All radiology studies independently viewed by me.      EKG Interpretation None      MDM   Final diagnoses:  Shortness of breath  Acute asthma exacerbation, unspecified asthma severity    48 yo female with a history of asthma who presents with shortness of breath. Exam is consistent with acute asthma exacerbation. She has increased work of breathing and is very tight.  Plan treatment with albuterol, Solu-Medrol.  After several albuterol treatments and steroids, she made a significant turn around.  No longer short of breath.  She was able to ambulate without trouble.  I stressed the importance of good follow up.  Low potassium likely secondary to albuterol administration.    Artis Delay, MD 11/24/14 2211

## 2015-02-09 ENCOUNTER — Encounter (HOSPITAL_COMMUNITY): Payer: Self-pay

## 2015-02-09 ENCOUNTER — Emergency Department (HOSPITAL_COMMUNITY)
Admission: EM | Admit: 2015-02-09 | Discharge: 2015-02-09 | Disposition: A | Discharge status: Home / Self Care | Payer: Self-pay | Attending: Emergency Medicine | Admitting: Emergency Medicine

## 2015-02-09 ENCOUNTER — Emergency Department (HOSPITAL_COMMUNITY): Payer: Self-pay

## 2015-02-09 DIAGNOSIS — Z880 Allergy status to penicillin: Secondary | ICD-10-CM | POA: Insufficient documentation

## 2015-02-09 DIAGNOSIS — J45901 Unspecified asthma with (acute) exacerbation: Secondary | ICD-10-CM | POA: Insufficient documentation

## 2015-02-09 DIAGNOSIS — Z862 Personal history of diseases of the blood and blood-forming organs and certain disorders involving the immune mechanism: Secondary | ICD-10-CM | POA: Insufficient documentation

## 2015-02-09 DIAGNOSIS — Z7952 Long term (current) use of systemic steroids: Secondary | ICD-10-CM | POA: Insufficient documentation

## 2015-02-09 DIAGNOSIS — I10 Essential (primary) hypertension: Secondary | ICD-10-CM | POA: Insufficient documentation

## 2015-02-09 DIAGNOSIS — Z79899 Other long term (current) drug therapy: Secondary | ICD-10-CM | POA: Insufficient documentation

## 2015-02-09 DIAGNOSIS — M1990 Unspecified osteoarthritis, unspecified site: Secondary | ICD-10-CM | POA: Insufficient documentation

## 2015-02-09 DIAGNOSIS — Z87891 Personal history of nicotine dependence: Secondary | ICD-10-CM | POA: Insufficient documentation

## 2015-02-09 DIAGNOSIS — Z8673 Personal history of transient ischemic attack (TIA), and cerebral infarction without residual deficits: Secondary | ICD-10-CM | POA: Insufficient documentation

## 2015-02-09 DIAGNOSIS — Z90710 Acquired absence of both cervix and uterus: Secondary | ICD-10-CM | POA: Insufficient documentation

## 2015-02-09 DIAGNOSIS — F419 Anxiety disorder, unspecified: Secondary | ICD-10-CM | POA: Insufficient documentation

## 2015-02-09 DIAGNOSIS — G43909 Migraine, unspecified, not intractable, without status migrainosus: Secondary | ICD-10-CM | POA: Insufficient documentation

## 2015-02-09 DIAGNOSIS — Z8541 Personal history of malignant neoplasm of cervix uteri: Secondary | ICD-10-CM | POA: Insufficient documentation

## 2015-02-09 LAB — CBC WITH DIFFERENTIAL/PLATELET
BASOS ABS: 0 10*3/uL (ref 0.0–0.1)
Basophils Relative: 0 % (ref 0–1)
Eosinophils Absolute: 0.4 10*3/uL (ref 0.0–0.7)
Eosinophils Relative: 7 % — ABNORMAL HIGH (ref 0–5)
HCT: 39.5 % (ref 36.0–46.0)
HEMOGLOBIN: 12.9 g/dL (ref 12.0–15.0)
LYMPHS ABS: 2 10*3/uL (ref 0.7–4.0)
LYMPHS PCT: 33 % (ref 12–46)
MCH: 30.1 pg (ref 26.0–34.0)
MCHC: 32.7 g/dL (ref 30.0–36.0)
MCV: 92.3 fL (ref 78.0–100.0)
Monocytes Absolute: 0.4 10*3/uL (ref 0.1–1.0)
Monocytes Relative: 6 % (ref 3–12)
NEUTROS ABS: 3.2 10*3/uL (ref 1.7–7.7)
Neutrophils Relative %: 54 % (ref 43–77)
Platelets: 297 10*3/uL (ref 150–400)
RBC: 4.28 MIL/uL (ref 3.87–5.11)
RDW: 13.3 % (ref 11.5–15.5)
WBC: 5.9 10*3/uL (ref 4.0–10.5)

## 2015-02-09 LAB — COMPREHENSIVE METABOLIC PANEL
ALK PHOS: 65 U/L (ref 39–117)
ALT: 11 U/L (ref 0–35)
ANION GAP: 5 (ref 5–15)
AST: 16 U/L (ref 0–37)
Albumin: 3.7 g/dL (ref 3.5–5.2)
BUN: 9 mg/dL (ref 6–23)
CHLORIDE: 106 mmol/L (ref 96–112)
CO2: 29 mmol/L (ref 19–32)
Calcium: 9.2 mg/dL (ref 8.4–10.5)
Creatinine, Ser: 0.64 mg/dL (ref 0.50–1.10)
GFR calc Af Amer: >90 mL/min (ref >90)
GFR calc non Af Amer: >90 mL/min (ref >90)
GLUCOSE: 105 mg/dL — AB (ref 70–99)
POTASSIUM: 3.6 mmol/L (ref 3.5–5.1)
Sodium: 140 mmol/L (ref 135–145)
Total Bilirubin: 0.3 mg/dL (ref 0.3–1.2)
Total Protein: 7 g/dL (ref 6.0–8.3)

## 2015-02-09 LAB — I-STAT TROPONIN, ED: Troponin i, poc: 0.01 ng/mL (ref 0.00–0.08)

## 2015-02-09 MED ORDER — PREDNISONE 10 MG PO TABS: 40.0000 mg | ORAL_TABLET | Freq: Every day | ORAL | Status: DC

## 2015-02-09 MED ORDER — ALBUTEROL SULFATE HFA 108 (90 BASE) MCG/ACT IN AERS
2.0000 | INHALATION_SPRAY | Freq: Once | RESPIRATORY_TRACT | Status: AC
  Administered 2015-02-09: 2 via RESPIRATORY_TRACT
  Filled 2015-02-09: qty 6.7

## 2015-02-09 MED ORDER — IPRATROPIUM-ALBUTEROL 0.5-2.5 (3) MG/3ML IN SOLN
3.0000 mL | Freq: Once | RESPIRATORY_TRACT | Status: AC
  Administered 2015-02-09: 3 mL via RESPIRATORY_TRACT
  Filled 2015-02-09: qty 3

## 2015-02-09 MED ORDER — ALBUTEROL SULFATE (2.5 MG/3ML) 0.083% IN NEBU: 2.5000 mg | INHALATION_SOLUTION | RESPIRATORY_TRACT | Status: DC | PRN

## 2015-02-09 MED ORDER — METHYLPREDNISOLONE SODIUM SUCC 125 MG IJ SOLR
125.0000 mg | Freq: Once | INTRAMUSCULAR | Status: AC
  Administered 2015-02-09: 125 mg via INTRAVENOUS
  Filled 2015-02-09: qty 2

## 2015-02-09 MED ORDER — HYDROCHLOROTHIAZIDE 25 MG PO TABS: 25.0000 mg | ORAL_TABLET | Freq: Every day | ORAL | Status: DC

## 2015-02-09 MED ORDER — ALBUTEROL (5 MG/ML) CONTINUOUS INHALATION SOLN
10.0000 mg/h | INHALATION_SOLUTION | RESPIRATORY_TRACT | Status: DC
  Administered 2015-02-09: 10 mg/h via RESPIRATORY_TRACT
  Filled 2015-02-09: qty 20

## 2015-02-09 MED ORDER — IPRATROPIUM BROMIDE 0.02 % IN SOLN
0.5000 mg | Freq: Once | RESPIRATORY_TRACT | Status: AC
  Administered 2015-02-09: 0.5 mg via RESPIRATORY_TRACT
  Filled 2015-02-09: qty 2.5

## 2015-02-09 NOTE — ED Notes (Signed)
Bed: WA02 Expected date:  Expected time:  Means of arrival:  Comments: Triage 2 

## 2015-02-09 NOTE — Progress Notes (Signed)
Adult wheeze protocol not done due to Pt getting a ALBUTEROL 10MG  continues neb with ATROVENT 0.5mg .

## 2015-02-09 NOTE — ED Provider Notes (Signed)
CSN: [id number]     Arrival date & time 02/09/15  1438 History   First MD Initiated Contact with Patient 02/09/15 1514     Chief Complaint  Patient presents with  . Shortness of Breath   HPI  Patient is a 49 year old female with past medical history of hypertension, hyperlipidemia, and asthma who presents emergency room for evaluation of shortness of breath. Patient states that for the past week and half she has had shortness of breath, wheezing, and cough at home. She states she had an asthma attack approximately week ago while she was in the shower and used her nebulizer. Since then she has had consistent wheezing. She states she has gone through an albuterol inhaler pump in 1 week. She has also been using up to 3 nebulizer treatments a day with little relief. Patient states that she tried to wait it out but could not handle her shortness of breath any longer. Patient states that when she usually gets this bad she needs steroids, continuous nebulizers, and sometimes magnesium. She states that she is currently without insurance and has not been to the doctor. She states she is out of her hypertension medications and her Symbicort. [facility]. Patient denies any chest pain, PND, orthopnea, leg swelling, palpitations, abdominal pain, fever, congestion, nasal discharge, diarrhea, constipation, melena, hematochezia.  Past Medical History  Diagnosis Date  . Asthma   . Thyroid disease   . Hypertension   . High cholesterol   . Migraine   . Morbid obesity   . H/O: hysterectomy   . Medical history non-contributory   . Hypothyroidism   . Anxiety   . Shortness of breath   . Stroke   . Arthritis   . Anemia   . Cancer     cervic   Past Surgical History  Procedure Laterality Date  . Cesarean section      3 occasions  . Thyroidectomy, partial      Goiter  . Cholecystectomy    . Abdominal hysterectomy     Family History   Problem Relation Age of Onset  . Migraines Sister    History  Substance Use Topics  . Smoking status: Former Research scientist (life sciences)  . Smokeless tobacco: Never Used  . Alcohol Use: No   OB History    No data available     Review of Systems  Constitutional: Negative for fever, chills and fatigue.  HENT: Negative for congestion, postnasal drip, rhinorrhea, sinus pressure and sore throat.   Respiratory: Positive for cough, shortness of breath and wheezing. Negative for chest tightness.   Cardiovascular: Negative for chest pain, palpitations and leg swelling.  Gastrointestinal: Negative for nausea, vomiting, abdominal pain, diarrhea and constipation.  Genitourinary: Negative for dysuria, urgency, hematuria, flank pain and difficulty urinating.  Skin: Negative for rash.  All other systems reviewed and are negative.     Allergies  Blueberry; Mango flavor; Aspirin; and Penicillins  Home Medications   Prior to Admission medications   Medication Sig Start Date End Date Taking? Authorizing Provider  albuterol (PROVENTIL HFA;VENTOLIN HFA) 108 (90 BASE) MCG/ACT inhaler Inhale 2 puffs into the lungs every 4 (four) hours as needed for wheezing or shortness of breath (wheezing). 11/24/14  Yes Houston Siren III, MD  albuterol (PROVENTIL) (2.5 MG/3ML) 0.083% nebulizer solution Take 2.5 mg by nebulization every 6 (six) hours as needed for wheezing or shortness of breath (wheezing).   Yes Historical Provider, MD  hydrochlorothiazide (HYDRODIURIL) 25 MG tablet Take 1 tablet (25 mg total) by mouth at bedtime. 04/25/14  Yes Annita Brod, MD  naproxen sodium (ANAPROX) 220 MG tablet Take 880 mg by mouth daily.   Yes Historical Provider, MD  albuterol (PROVENTIL) (2.5 MG/3ML) 0.083% nebulizer solution Take 3 mLs (2.5 mg total) by nebulization every 4 (four) hours as needed for wheezing or shortness of breath. 02/09/15   Orvile Corona A Forcucci, PA-C  cyclobenzaprine (FLEXERIL) 10 MG tablet Take 1 tablet (10 mg  total) by mouth 3 (three) times daily as needed for muscle spasms. Patient not taking: Reported on 02/09/2015 09/09/14   Patty Sermons Camprubi-Soms, PA-C  hydrochlorothiazide (HYDRODIURIL) 25 MG tablet Take 1 tablet (25 mg total) by mouth daily. 02/09/15   Majesti Gambrell A Forcucci, PA-C  naproxen (NAPROSYN) 500 MG tablet Take 1 tablet (500 mg total) by mouth 2 (two) times daily as needed for mild pain, moderate pain or headache (TAKE WITH MEALS.). Patient not taking: Reported on 02/09/2015 09/09/14   Patty Sermons Camprubi-Soms, PA-C  oxyCODONE-acetaminophen (PERCOCET) 5-325 MG per tablet Take 1-2 tablets by mouth every 6 (six) hours as needed for severe pain. Patient not taking: Reported on 02/09/2015 09/09/14   Patty Sermons Camprubi-Soms, PA-C  predniSONE (DELTASONE) 10 MG tablet Take 4 tablets (40 mg total) by mouth daily. 02/09/15   Tapanga [place]way A Forcucci, PA-C   BP 183/92 mmHg  Pulse 98  Temp(Src) 97.9 F (36.6 C) (Oral)  Resp 18  SpO2 96% Physical Exam  Constitutional: She is oriented to person, place, and time. She appears well-developed and well-nourished. No distress.  HENT:  Head: Normocephalic and atraumatic.  Mouth/Throat: Oropharynx is clear and moist. No oropharyngeal exudate.  Eyes: Conjunctivae and EOM are normal. Pupils are equal, round, and reactive to light. No scleral icterus.  Neck: Normal range of motion. Neck supple. No JVD present. No thyromegaly present.  Cardiovascular: Normal rate, regular rhythm, normal heart sounds and intact distal pulses.  Exam reveals no gallop and no friction rub.   No murmur heard. Pulmonary/Chest: Effort normal. No respiratory distress. She has wheezes. She has no rales. She exhibits no tenderness.  Abdominal: Soft. Bowel sounds are normal. She exhibits no distension and no mass. There is no tenderness. There is no rebound and no guarding.  Musculoskeletal: Normal range of motion.  Lymphadenopathy:    She has no cervical adenopathy.   Neurological: She is alert and oriented to person, place, and time.  Skin: Skin is warm and dry. She is not diaphoretic.  Psychiatric: She has a normal mood and affect. Her behavior is normal. Judgment and thought content normal.  Nursing note and vitals reviewed.   ED Course  Procedures (including critical care time) Labs Review Labs Reviewed  CBC WITH DIFFERENTIAL/PLATELET - Abnormal; Notable for the following:    Eosinophils Relative 7 (*)    All other components within normal limits  COMPREHENSIVE METABOLIC PANEL - Abnormal; Notable for the following:    Glucose, Bld 105 (*)    All other components within normal limits  I-STAT TROPOININ, ED    Imaging Review Dg Chest 2 View (if Patient Has Fever And/or Copd)  02/09/2015   CLINICAL DATA:  Shortness of breath.  Cough, congestion for 2 days.  EXAM: CHEST  2 VIEW  COMPARISON:  11/24/2014  FINDINGS: The heart size and mediastinal contours are within normal limits. Both lungs are clear. The visualized skeletal structures are unremarkable.  IMPRESSION: No active cardiopulmonary disease.   Electronically Signed  By: Rolm Baptise M.D.   On: 02/09/2015 16:00     EKG Interpretation None      MDM   Final diagnoses:  Asthma exacerbation  Essential hypertension   Patient is a 49 year old female who presents emergency room for evaluation of shortness of breath. On initial physical exam patient had stable vital signs with hypertension tachypnea. Patient had diffuse wheezing. Patient given continuous nebulizer and 125 mg of Solu-Medrol here. On recheck patient has minimal end expiratory wheezes. She is feeling considerably better and would like to go home. CBC, CMP, and i-STAT troponin are negative. Chest x-ray is clear. Patient has hypertension secondary to not taking her HCTZ. We'll discharge her home with prescriptions for HCTZ. Patient given albuterol nebulizer treatment here, but will send home with albuterol nebulizer refills, and also  an inhaler. Patient to be sent home with a prednisone burst for wheezing. Patient return for worsening shortness of breath, chest pain, intractable wheezing, or any other concerning symptoms. [facility] She states that she will call for an appointment this week. Patient seen by and discussed with Dr. Lacinda Axon who agrees the above workup and plan.  Cherylann Parr, PA-C 02/09/15 [place], MD 02/12/15 [phone]

## 2015-02-09 NOTE — Discharge Instructions (Signed)
Bronchospasm °A bronchospasm is a spasm or tightening of the airways going into the lungs. During a bronchospasm breathing becomes more difficult because the airways get smaller. When this happens there can be coughing, a whistling sound when breathing (wheezing), and difficulty breathing. Bronchospasm is often associated with asthma, but not all patients who experience a bronchospasm have asthma. °CAUSES  °A bronchospasm is caused by inflammation or irritation of the airways. The inflammation or irritation may be triggered by:  °· Allergies (such as to animals, pollen, food, or mold). Allergens that cause bronchospasm may cause wheezing immediately after exposure or many hours later.   °· Infection. Viral infections are believed to be the most common cause of bronchospasm.   °· Exercise.   °· Irritants (such as pollution, cigarette smoke, strong odors, aerosol sprays, and paint fumes).   °· Weather changes. Winds increase molds and pollens in the air. Rain refreshes the air by washing irritants out. Cold air may cause inflammation.   °· Stress and emotional upset.   °SIGNS AND SYMPTOMS  °· Wheezing.   °· Excessive nighttime coughing.   °· Frequent or severe coughing with a simple cold.   °· Chest tightness.   °· Shortness of breath.   °DIAGNOSIS  °Bronchospasm is usually diagnosed through a history and physical exam. Tests, such as chest X-rays, are sometimes done to look for other conditions. °TREATMENT  °· Inhaled medicines can be given to open up your airways and help you breathe. The medicines can be given using either an inhaler or a nebulizer machine. °· Corticosteroid medicines may be given for severe bronchospasm, usually when it is associated with asthma. °HOME CARE INSTRUCTIONS  °· Always have a plan prepared for seeking medical care. Know when to call your health care provider and local emergency services (911 in the U.S.). Know where you can access local emergency care. °· Only take medicines as  directed by your health care provider. °· If you were prescribed an inhaler or nebulizer machine, ask your health care provider to explain how to use it correctly. Always use a spacer with your inhaler if you were given one. °· It is necessary to remain calm during an attack. Try to relax and breathe more slowly.  °· Control your home environment in the following ways:   °¨ Change your heating and air conditioning filter at least once a month.   °¨ Limit your use of fireplaces and wood stoves. °¨ Do not smoke and do not allow smoking in your home.   °¨ Avoid exposure to perfumes and fragrances.   °¨ Get rid of pests (such as roaches and mice) and their droppings.   °¨ Throw away plants if you see mold on them.   °¨ Keep your house clean and dust free.   °¨ Replace carpet with wood, tile, or vinyl flooring. Carpet can trap dander and dust.   °¨ Use allergy-proof pillows, mattress covers, and box spring covers.   °¨ Wash bed sheets and blankets every week in hot water and dry them in a dryer.   °¨ Use blankets that are made of polyester or cotton.   °¨ Wash hands frequently. °SEEK MEDICAL CARE IF:  °· You have muscle aches.   °· You have chest pain.   °· The sputum changes from clear or white to yellow, green, gray, or bloody.   °· The sputum you cough up gets thicker.   °· There are problems that may be related to the medicine you are given, such as a rash, itching, swelling, or trouble breathing.   °SEEK IMMEDIATE MEDICAL CARE IF:  °· You have worsening wheezing and coughing even   after taking your prescribed medicines.   You have increased difficulty breathing.   You develop severe chest pain. MAKE SURE YOU:   Understand these instructions.  Will watch your condition.  Will get help right away if you are not doing well or get worse. Document Released: 12/02/2003 Document Revised: 12/04/2013 Document Reviewed: 05/21/2013 [facility] Patient Information 2015 [place], [place]. This information is not  intended to replace advice given to you by your health care provider. Make sure you discuss any questions you have with your health care provider.   Emergency Department Resource Guide 1) Find a Doctor and Pay Out of Pocket Although you won't have to find out who is covered by your insurance plan, it is a good idea to ask around and get recommendations. You will then need to call the office and see if the doctor you have chosen will accept you as a new patient and what types of options they offer for patients who are self-pay. Some doctors offer discounts or will set up payment plans for their patients who do not have insurance, but you will need to ask so you aren't surprised when you get to your appointment.  2) Contact Your Local Health Department Not all health departments have doctors that can see patients for sick visits, but many do, so it is worth a call to see if yours does. If you don't know where your local health department is, you can check in your phone book. The CDC also has a tool to help you locate your state's health department, and many state websites also have listings of all of their local health departments.  3) Find a [facility] If your illness is not likely to be very severe or complicated, you may want to try a walk in clinic. These are popping up all over the country in pharmacies, drugstores, and shopping centers. They're usually staffed by nurse practitioners or physician assistants that have been trained to treat common illnesses and complaints. They're usually fairly quick and inexpensive. However, if you have serious medical issues or chronic medical problems, these are probably not your best option.  No Primary Care Doctor: - Call Health Connect at  [phone] - they can help you locate a primary care doctor that  accepts your insurance, provides certain services, etc. - Physician Referral Service- [phone]  Chronic Pain Problems: Organization          Address  Phone   Notes  [facility]  [phone] Patients need to be referred by their primary care doctor.   Medication Assistance: Organization         Address  Phone   Notes  [facility] Medication [facility] [address] [place], [place] [address] [phone] --Must be a resident of [facility] -- Must have NO insurance coverage whatsoever (no Medicaid/ Medicare, etc.) -- The pt. MUST have a primary care doctor that directs their care regularly and follows them in the community   MedAssist  [phone]   [employer]  [phone]    Agencies that provide inexpensive medical care: Organization         Address  Phone   Notes  [place]  [phone]   [facility]    [phone]   [facility] [place], [place] [address] [phone]   [place] [address], [place] [phone]   [facility]    [phone]   [facility]    [phone]   Community [facility] and [address], [place] Phone:  [phone], Fax:  [phone] Hours of Operation:  9 am - 6 pm, M-F.  Also accepts Medicaid/Medicare and self-pay.  [facility] for [place], [address], [place] Phone: [phone], Fax: [phone]. Hours of Operation:  8:30 am - 5:30 pm, M-F.  Also accepts Medicaid and self-pay.  [facility] [address], [place] Phone: [phone]   [place], [place], [place] [phone], Ext. [phone] Mondays & Thursdays: 7-9 AM.  First 15 patients are seen on a first come, first serve basis.    [place] Providers:  Organization         Address  Phone   Notes  [facility] [address], [place] [phone] Also accepts self-pay patients.  [facility] [address], [place]  [phone]   [place], [address], [place] [phone]   [facility] [address] [place] [phone]   Lucianne Lei [address], [place]   [phone] Only accepts [place] Access [place] patients after they have their name applied to their card.   Self-Pay (no insurance) in [facility]:  Organization         Address  Phone   Notes  Sickle Cell Patients, [facility] [place] [phone]   [facility] Urgent Care [place] [phone]   [facility] [place]  [place], [place], [place] [phone]   [facility]/Dr. Osei-Bonsu  [address] [place] or [address], [place] [phone] Phone number for both [place] and [place] locations is the same.  Urgent Medical and [facility] [address], [place] [phone]   [facility] [address], [place] or [address] [phone]   [facility] [address], [place] [phone], phone; [phone], fax Sees patients 1st and 3rd Saturday of every month.  Must not qualify for public or private insurance (i.e. Medicaid, Medicare, [place] Health Choice, Veterans' Benefits)  Household income should be no more than 200% of the poverty level The clinic cannot treat you if you are pregnant or think you are pregnant  Sexually transmitted diseases are not treated at the clinic.    Dental Care: Organization         Address  Phone  Notes  [facility] [place] [phone] Accepts children up to age 70 who are enrolled in [place] or [place]; pregnant women with a Medicaid card; and  children who have applied for Medicaid or [place] Health Choice, but were declined, whose parents can pay a reduced fee at time of service.  [facility] Department of [facility]  [address], [place] [phone] Accepts children up to age 68 who are enrolled in [place] or [place]; pregnant women with a Medicaid card; and children who have applied for Medicaid or [place], but were declined, whose  parents can pay a reduced fee at time of service.  [facility] Adult [facility] Access PROGRAM  [place] [phone] Patients are seen by appointment only. Walk-ins are not accepted. [place] will see patients 98 years of age and older. Monday - Tuesday (8am-5pm) Most Wednesdays (8:30-5pm) $30 per visit, cash only  [facility] Adult [facility] Access PROGRAM  [address], [facility] [phone] Patients are seen by appointment only. Walk-ins are not accepted. [place] will see patients 35 years of age and older. One Wednesday Evening (Monthly: Volunteer Based).  $30 per visit, cash only  [place]  [phone] for adults; Children under age 24, call Graduate Pediatric Dentistry at [phone]. Children aged 26-14, please call [phone] to request a pediatric application.  Dental services are provided in all areas of dental care including fillings, crowns and bridges, complete and partial dentures, implants, gum treatment, root canals, and extractions. Preventive care is also provided. Treatment is provided to both adults and children. Patients are selected via a lottery and there is often a waiting list.   [facility] [address], [place]  [phone] [email or web address]   [facility] [address] [place], [place] [phone], Ext. [phone] Second and Fourth Thursday of each month, opens at 6:30 AM; Clinic ends at 9 AM.  Patients are seen on a first-come first-served  basis, and a limited number are seen during each clinic.   [facility]  [address] [place], [place] [phone]   Eligibility Requirements You must have lived in [place], [place], or [place] counties for at least the last three months.   You cannot be eligible for state or federal sponsored [employer], including [employer], [place], or [employer].   You generally cannot be eligible for healthcare insurance through your employer.    How to apply: Eligibility screenings are held every Tuesday and Wednesday afternoon from 1:00 pm until 4:00 pm. You do not need an appointment for the interview!  [facility] [address] [place], [place]   [place]  [place] Department  [place]  [phone]    Behavioral Health Resources in the Community: Intensive Outpatient Programs Organization         Address  Phone  Notes  [place]. [address] [place], [place] [phone]   [facility] [address], [place], [place]   ADS: Alcohol [facility] Drug [facility] [address], [place], [place]   [place] [address]  [place], [place] or [phone]   Substance Abuse Resources Organization         Address  Phone  Notes  Alcohol and Drug Services  [phone]   [place]  [phone]   [place]   Chinita Pester  [phone]   Residential & Outpatient Substance Abuse Program  [phone]   Psychological Services Organization         Address  Phone  Notes  [facility] [place]  [place]  [phone]   [place] [address], [place] or [phone]    Mobile Crisis Teams Organization          Address  Phone  Notes  [facility], Mobile Crisis  Care Unit  [phone]   [facility]  [address] [place], [place]   [facility] [address], [place] [phone]    Self-Help/Support Groups Organization         Address  Phone             Notes  [place] of [place] - variety of support groups  [place] Call for more information  Narcotics Anonymous (NA), [facility]aring [facility] [address], [place]  2 meetings at this location   Special educational needs teacher         Address  Phone  Notes  [facility] Residential Treatment [place],    [place]  [phone]   [facility]  [address], [place] [phone], [place], [place]   [place], [place] [phone] Admissions: 8am-3pm M-F  Incentives Substance [place] [address]    [place], [place] [phone]   [facility] [address] [place], [place], [place]   [facility] [address]  [place], [place]   [facility] - Intensive Outpatient [address], [place], [place]   [facility] ([place].,  [place], [place] [phone] or [phone]   [facility] Services (RTS) [address] [place], [place] Accepts Medicaid  [place] [address]  [place] [phone] Substance Abuse/Addiction Treatment   [facility] Organization         Address  Phone  Notes  [facility]  [phone]   Domenic Schwab, PhD [address] Arlis [place], [place]   [phone] or [phone]   [place], [place] [phone]   [facility] [address], [place], [place] [phone] Insurance/Medicaid/sponsorship  through [facility] and Families [address], [address]                                    [place], [place] [phone] [place] [address], [place] [phone]    Dr. [name]  [phone]   [facility] Dept. 1) [address], [place] 2) [place] 3)  [place] [address], [place] [phone]  [phone]   [place] [phone] or [phone] (After Hours)

## 2015-02-09 NOTE — ED Notes (Signed)
Pt c/o increasing SOB x "1-1.5 weeks."  Denies pain.  Hx of asthma.  Pt reports using nebulizer at home w/o relief.

## 2015-03-14 ENCOUNTER — Emergency Department (HOSPITAL_COMMUNITY)
Admission: EM | Admit: 2015-03-14 | Discharge: 2015-03-14 | Disposition: A | Discharge status: Home / Self Care | Payer: Self-pay | Attending: Emergency Medicine | Admitting: Emergency Medicine

## 2015-03-14 ENCOUNTER — Encounter (HOSPITAL_COMMUNITY): Payer: Self-pay | Admitting: Emergency Medicine

## 2015-03-14 ENCOUNTER — Emergency Department (HOSPITAL_COMMUNITY): Payer: Self-pay

## 2015-03-14 DIAGNOSIS — Z791 Long term (current) use of non-steroidal anti-inflammatories (NSAID): Secondary | ICD-10-CM | POA: Insufficient documentation

## 2015-03-14 DIAGNOSIS — Z87891 Personal history of nicotine dependence: Secondary | ICD-10-CM | POA: Insufficient documentation

## 2015-03-14 DIAGNOSIS — Z862 Personal history of diseases of the blood and blood-forming organs and certain disorders involving the immune mechanism: Secondary | ICD-10-CM | POA: Insufficient documentation

## 2015-03-14 DIAGNOSIS — Z8673 Personal history of transient ischemic attack (TIA), and cerebral infarction without residual deficits: Secondary | ICD-10-CM | POA: Insufficient documentation

## 2015-03-14 DIAGNOSIS — Z8541 Personal history of malignant neoplasm of cervix uteri: Secondary | ICD-10-CM | POA: Insufficient documentation

## 2015-03-14 DIAGNOSIS — Z8659 Personal history of other mental and behavioral disorders: Secondary | ICD-10-CM | POA: Insufficient documentation

## 2015-03-14 DIAGNOSIS — I10 Essential (primary) hypertension: Secondary | ICD-10-CM | POA: Insufficient documentation

## 2015-03-14 DIAGNOSIS — G43909 Migraine, unspecified, not intractable, without status migrainosus: Secondary | ICD-10-CM | POA: Insufficient documentation

## 2015-03-14 DIAGNOSIS — M1990 Unspecified osteoarthritis, unspecified site: Secondary | ICD-10-CM | POA: Insufficient documentation

## 2015-03-14 DIAGNOSIS — J45901 Unspecified asthma with (acute) exacerbation: Secondary | ICD-10-CM | POA: Insufficient documentation

## 2015-03-14 MED ORDER — METHYLPREDNISOLONE SODIUM SUCC 125 MG IJ SOLR
125.0000 mg | Freq: Once | INTRAMUSCULAR | Status: AC
  Administered 2015-03-14: 125 mg via INTRAVENOUS
  Filled 2015-03-14: qty 2

## 2015-03-14 MED ORDER — KETOROLAC TROMETHAMINE 30 MG/ML IJ SOLN
15.0000 mg | Freq: Once | INTRAMUSCULAR | Status: AC
  Administered 2015-03-14: 15 mg via INTRAVENOUS
  Filled 2015-03-14: qty 1

## 2015-03-14 MED ORDER — PREDNISONE 20 MG PO TABS: ORAL_TABLET | ORAL | Status: DC

## 2015-03-14 MED ORDER — ALBUTEROL (5 MG/ML) CONTINUOUS INHALATION SOLN
10.0000 mg/h | INHALATION_SOLUTION | Freq: Once | RESPIRATORY_TRACT | Status: AC
  Administered 2015-03-14: 10 mg/h via RESPIRATORY_TRACT
  Filled 2015-03-14: qty 20

## 2015-03-14 MED ORDER — ALBUTEROL SULFATE HFA 108 (90 BASE) MCG/ACT IN AERS
2.0000 | INHALATION_SPRAY | Freq: Once | RESPIRATORY_TRACT | Status: AC
Start: 2015-03-14 — End: 2015-03-14
  Administered 2015-03-14: 2 via RESPIRATORY_TRACT
  Filled 2015-03-14: qty 6.7

## 2015-03-14 NOTE — Discharge Instructions (Signed)
Return to the emergency room with worsening of symptoms, new symptoms or with symptoms that are concerning , especially fevers, stiff neck, coughing up blood, Shortness of breath, chest pain,nausea/vomiting, visual changes or slurred speech, cough with thick colored mucous or blood. Take prednisone for the next 5 days. Continue with breathing treatments. Follow up with your primary care provider and go to the wellness center in 2-5 days for reevaluation.  Read below information and follow recommendations.  Asthma, Acute Bronchospasm Acute bronchospasm caused by asthma is also referred to as an asthma attack. Bronchospasm means your air passages become narrowed. The narrowing is caused by inflammation and tightening of the muscles in the air tubes (bronchi) in your lungs. This can make it hard to breathe or cause you to wheeze and cough. CAUSES Possible triggers are:  Animal dander from the skin, hair, or feathers of animals.  Dust mites contained in house dust.  Cockroaches.  Pollen from trees or grass.  Mold.  Cigarette or tobacco smoke.  Air pollutants such as dust, household cleaners, hair sprays, aerosol sprays, paint fumes, strong chemicals, or strong odors.  Cold air or weather changes. Cold air may trigger inflammation. Winds increase molds and pollens in the air.  Strong emotions such as crying or laughing hard.  Stress.  Certain medicines such as aspirin or beta-blockers.  Sulfites in foods and drinks, such as dried fruits and wine.  Infections or inflammatory conditions, such as a flu, cold, or inflammation of the nasal membranes (rhinitis).  Gastroesophageal reflux disease (GERD). GERD is a condition where stomach acid backs up into your esophagus.  Exercise or strenuous activity. SIGNS AND SYMPTOMS   Wheezing.  Excessive coughing, particularly at night.  Chest tightness.  Shortness of breath. DIAGNOSIS  Your health care provider will ask you about your  medical history and perform a physical exam. A chest X-ray or blood testing may be performed to look for other causes of your symptoms or other conditions that may have triggered your asthma attack. TREATMENT  Treatment is aimed at reducing inflammation and opening up the airways in your lungs. Most asthma attacks are treated with inhaled medicines. These include quick relief or rescue medicines (such as bronchodilators) and controller medicines (such as inhaled corticosteroids). These medicines are sometimes given through an inhaler or a nebulizer. Systemic steroid medicine taken by mouth or given through an IV tube also can be used to reduce the inflammation when an attack is moderate or severe. Antibiotic medicines are only used if a bacterial infection is present.  HOME CARE INSTRUCTIONS   Rest.  Drink plenty of liquids. This helps the mucus to remain thin and be easily coughed up. Only use caffeine in moderation and do not use alcohol until you have recovered from your illness.  Do not smoke. Avoid being exposed to secondhand smoke.  You play a critical role in keeping yourself in good health. Avoid exposure to things that cause you to wheeze or to have breathing problems.  Keep your medicines up-to-date and available. Carefully follow your health care provider's treatment plan.  Take your medicine exactly as prescribed.  When pollen or pollution is bad, keep windows closed and use an air conditioner or go to places with air conditioning.  Asthma requires careful medical care. See your health care provider for a follow-up as advised. If you are more than [redacted] weeks pregnant and you were prescribed any new medicines, let your obstetrician know about the visit and how you are doing. Follow  up with your health care provider as directed.  After you have recovered from your asthma attack, make an appointment with your outpatient doctor to talk about ways to reduce the likelihood of future  attacks. If you do not have a doctor who manages your asthma, make an appointment with a primary care doctor to discuss your asthma. SEEK IMMEDIATE MEDICAL CARE IF:   You are getting worse.  You have trouble breathing. If severe, call your local emergency services (911 in the U.S.).  You develop chest pain or discomfort.  You are vomiting.  You are not able to keep fluids down.  You are coughing up yellow, green, brown, or bloody sputum.  You have a fever and your symptoms suddenly get worse.  You have trouble swallowing. MAKE SURE YOU:   Understand these instructions.  Will watch your condition.  Will get help right away if you are not doing well or get worse. Document Released: 03/16/2007 Document Revised: 12/04/2013 Document Reviewed: 06/06/2013 [facility] Patient Information 2015 [place], [place]. This information is not intended to replace advice given to you by your health care provider. Make sure you discuss any questions you have with your health care provider.

## 2015-03-14 NOTE — ED Notes (Signed)
Per pt, states asthma symptoms for a couple of days-no relief with neb/inhaler

## 2015-03-14 NOTE — ED Notes (Signed)
Resp called to admin cont neb

## 2015-03-14 NOTE — ED Notes (Signed)
Pt reports that she took her inhalers at home with no relief. Pt sts cough with clear phlegm since pollen started. Pt has expiratory, inspiratory wheezing and is rhonchus bilaterally. Pt adds that she has R sided neck stiffness that started today. Pt is A&O and in NAD

## 2015-03-14 NOTE — ED Provider Notes (Signed)
CSN: [id number]     Arrival date & time 03/14/15  1121 History   First MD Initiated Contact with Patient 03/14/15 1135     Chief Complaint  Patient presents with  . Asthma     (Consider location/radiation/quality/duration/timing/severity/associated sxs/prior Treatment) HPI  Wendy Hoffman is a 49 y.o. female with PMH of asthma, hypertension, thyroid, morbid obesity presenting with couple days of wheezing, shortness of breath no improvement with inhaler and nebulizer at home. Patient also with cough productive of clear mucus. She denies fevers or chills. She denies other URI symptoms. Patient reports right sided neck stiffness worse with coughing. No improvement with tylenol and ibuprofen. No chest pain or peripheral edema. Patient here the 28th with similar symptoms requiring steroids and was discharged home.   Past Medical History  Diagnosis Date  . Asthma   . Thyroid disease   . Hypertension   . High cholesterol   . Migraine   . Morbid obesity   . H/O: hysterectomy   . Medical history non-contributory   . Hypothyroidism   . Anxiety   . Shortness of breath   . Stroke   . Arthritis   . Anemia   . Cancer     cervic   Past Surgical History  Procedure Laterality Date  . Cesarean section      3 occasions  . Thyroidectomy, partial      Goiter  . Cholecystectomy    . Abdominal hysterectomy     Family History  Problem Relation Age of Onset  . Migraines Sister    History  Substance Use Topics  . Smoking status: Former Research scientist (life sciences)  . Smokeless tobacco: Never Used  . Alcohol Use: No   OB History    No data available     Review of Systems 10 Systems reviewed and are negative for acute change except as noted in the HPI.    Allergies  Blueberry; Mango flavor; Aspirin; and Penicillins  Home Medications   Prior to Admission medications   Medication Sig Start Date End Date Taking? Authorizing Provider  albuterol (PROVENTIL HFA;VENTOLIN HFA) 108 (90 BASE) MCG/ACT inhaler  Inhale 2 puffs into the lungs every 4 (four) hours as needed for wheezing or shortness of breath (wheezing). 11/24/14  Yes Serita Grit, MD  albuterol (PROVENTIL) (2.5 MG/3ML) 0.083% nebulizer solution Take 3 mLs (2.5 mg total) by nebulization every 4 (four) hours as needed for wheezing or shortness of breath. 02/09/15  Yes Courtney Forcucci, PA-C  hydrochlorothiazide (HYDRODIURIL) 25 MG tablet Take 1 tablet (25 mg total) by mouth at bedtime. 04/25/14  Yes Annita Brod, MD  ibuprofen (ADVIL,MOTRIN) 200 MG tablet Take 800 mg by mouth every 6 (six) hours as needed for headache, mild pain or moderate pain.   Yes Historical Provider, MD  naproxen sodium (ANAPROX) 220 MG tablet Take 880 mg by mouth daily as needed (pain).    Yes Historical Provider, MD  cyclobenzaprine (FLEXERIL) 10 MG tablet Take 1 tablet (10 mg total) by mouth 3 (three) times daily as needed for muscle spasms. Patient not taking: Reported on 02/09/2015 09/09/14   Mercedes Camprubi-Soms, PA-C  hydrochlorothiazide (HYDRODIURIL) 25 MG tablet Take 1 tablet (25 mg total) by mouth daily. 02/09/15   Courtney Forcucci, PA-C  naproxen (NAPROSYN) 500 MG tablet Take 1 tablet (500 mg total) by mouth 2 (two) times daily as needed for mild pain, moderate pain or headache (TAKE WITH MEALS.). Patient not taking: Reported on 02/09/2015 09/09/14   Mercedes Camprubi-Soms, PA-C  oxyCODONE-acetaminophen (  PERCOCET) 5-325 MG per tablet Take 1-2 tablets by mouth every 6 (six) hours as needed for severe pain. Patient not taking: Reported on 02/09/2015 09/09/14   Mercedes Camprubi-Soms, PA-C  predniSONE (DELTASONE) 20 MG tablet 2 tabs po daily x 5 days 03/14/15   Al Corpus, PA-C   BP 146/85 mmHg  Pulse 98  Temp(Src) 97.9 F (36.6 C) (Oral)  Resp 20  SpO2 100% Physical Exam  Constitutional: She appears well-developed and well-nourished. No distress.  HENT:  Head: Normocephalic and atraumatic.  Eyes: Conjunctivae and EOM are normal. Right eye exhibits no  discharge. Left eye exhibits no discharge.  Cardiovascular: Normal rate and regular rhythm.   No leg swelling or tenderness. Negative Homan's sign.  Pulmonary/Chest:  Increased work of breathing with decreased air movement throughout with extensive wheezing. Patient with accessory muscle use equal excursion patient in moderate respiratory distress.  Abdominal: Soft. Bowel sounds are normal. She exhibits no distension. There is no tenderness.  Neurological: She is alert. She exhibits normal muscle tone. Coordination normal.  Skin: Skin is warm and dry. She is not diaphoretic.  Nursing note and vitals reviewed.   ED Course  Procedures (including critical care time) Labs Review Labs Reviewed - No data to display  Imaging Review Dg Chest 2 View  03/14/2015   CLINICAL DATA:  Increasing shortness of breath. Central chest pain. Asthma.  EXAM: CHEST  2 VIEW  COMPARISON:  02/09/2015  FINDINGS: The heart size and mediastinal contours are within normal limits. Both lungs are clear. The visualized skeletal structures are unremarkable.  IMPRESSION: Normal exam.   Electronically Signed   By: Lorriane Shire M.D.   On: 03/14/2015 13:15     EKG Interpretation None      MDM   Final diagnoses:  Asthma exacerbation   Patient presenting with asthma exacerbation without relief with home nebulizers or inhalers. VSS. No hypoxia or tachycardia. Patient given IV Solu-Medrol as well as continuous albuterol treatment with significant improvement of her symptoms. Patient states she is breathing at baseline. Chest x-ray negative. Patient ambulated in ED with oxygen saturations of 100. Patient stable for discharge with prednisone burst. She [facility] with [facility].  Discussed return precautions with patient. Discussed all results and patient verbalizes understanding and agrees with plan.   Al Corpus, PA-C 03/14/15 1640  Serita Grit, MD 03/15/15 [phone]

## 2015-06-18 ENCOUNTER — Encounter (HOSPITAL_COMMUNITY): Payer: Self-pay | Admitting: Emergency Medicine

## 2015-06-18 ENCOUNTER — Emergency Department (HOSPITAL_COMMUNITY): Payer: Self-pay

## 2015-06-18 ENCOUNTER — Emergency Department (HOSPITAL_COMMUNITY)
Admission: EM | Admit: 2015-06-18 | Discharge: 2015-06-18 | Disposition: A | Discharge status: Home / Self Care | Payer: Self-pay | Attending: Emergency Medicine | Admitting: Emergency Medicine

## 2015-06-18 DIAGNOSIS — J45901 Unspecified asthma with (acute) exacerbation: Secondary | ICD-10-CM | POA: Insufficient documentation

## 2015-06-18 DIAGNOSIS — Z8659 Personal history of other mental and behavioral disorders: Secondary | ICD-10-CM | POA: Insufficient documentation

## 2015-06-18 DIAGNOSIS — Z862 Personal history of diseases of the blood and blood-forming organs and certain disorders involving the immune mechanism: Secondary | ICD-10-CM | POA: Insufficient documentation

## 2015-06-18 DIAGNOSIS — Z87891 Personal history of nicotine dependence: Secondary | ICD-10-CM | POA: Insufficient documentation

## 2015-06-18 DIAGNOSIS — G43909 Migraine, unspecified, not intractable, without status migrainosus: Secondary | ICD-10-CM | POA: Insufficient documentation

## 2015-06-18 DIAGNOSIS — Z8541 Personal history of malignant neoplasm of cervix uteri: Secondary | ICD-10-CM | POA: Insufficient documentation

## 2015-06-18 DIAGNOSIS — I10 Essential (primary) hypertension: Secondary | ICD-10-CM | POA: Insufficient documentation

## 2015-06-18 DIAGNOSIS — Z8673 Personal history of transient ischemic attack (TIA), and cerebral infarction without residual deficits: Secondary | ICD-10-CM | POA: Insufficient documentation

## 2015-06-18 DIAGNOSIS — M1990 Unspecified osteoarthritis, unspecified site: Secondary | ICD-10-CM | POA: Insufficient documentation

## 2015-06-18 DIAGNOSIS — Z79899 Other long term (current) drug therapy: Secondary | ICD-10-CM | POA: Insufficient documentation

## 2015-06-18 DIAGNOSIS — Z880 Allergy status to penicillin: Secondary | ICD-10-CM | POA: Insufficient documentation

## 2015-06-18 MED ORDER — ALBUTEROL SULFATE (2.5 MG/3ML) 0.083% IN NEBU
5.0000 mg | INHALATION_SOLUTION | Freq: Once | RESPIRATORY_TRACT | Status: AC
  Administered 2015-06-18: 5 mg via RESPIRATORY_TRACT
  Filled 2015-06-18: qty 6

## 2015-06-18 MED ORDER — IPRATROPIUM BROMIDE 0.02 % IN SOLN
0.5000 mg | Freq: Once | RESPIRATORY_TRACT | Status: AC
Start: 2015-06-18 — End: 2015-06-18
  Administered 2015-06-18: 0.5 mg via RESPIRATORY_TRACT
  Filled 2015-06-18: qty 2.5

## 2015-06-18 MED ORDER — IPRATROPIUM BROMIDE 0.02 % IN SOLN
0.5000 mg | Freq: Once | RESPIRATORY_TRACT | Status: AC
  Administered 2015-06-18: 0.5 mg via RESPIRATORY_TRACT
  Filled 2015-06-18: qty 2.5

## 2015-06-18 MED ORDER — ALBUTEROL SULFATE HFA 108 (90 BASE) MCG/ACT IN AERS
2.0000 | INHALATION_SPRAY | RESPIRATORY_TRACT | Status: DC | PRN
  Administered 2015-06-18: 2 via RESPIRATORY_TRACT
  Filled 2015-06-18: qty 6.7

## 2015-06-18 MED ORDER — IPRATROPIUM-ALBUTEROL 0.5-2.5 (3) MG/3ML IN SOLN
RESPIRATORY_TRACT | Status: AC
  Filled 2015-06-18: qty 3

## 2015-06-18 MED ORDER — PREDNISONE 20 MG PO TABS: 40.0000 mg | ORAL_TABLET | Freq: Every day | ORAL | Status: DC

## 2015-06-18 MED ORDER — IPRATROPIUM-ALBUTEROL 0.5-2.5 (3) MG/3ML IN SOLN
3.0000 mL | Freq: Once | RESPIRATORY_TRACT | Status: AC
  Administered 2015-06-18: 3 mL via RESPIRATORY_TRACT

## 2015-06-18 MED ORDER — PREDNISONE 20 MG PO TABS
60.0000 mg | ORAL_TABLET | Freq: Once | ORAL | Status: AC
  Administered 2015-06-18: 60 mg via ORAL
  Filled 2015-06-18: qty 3

## 2015-06-18 NOTE — Discharge Instructions (Signed)

## 2015-06-18 NOTE — ED Notes (Signed)
Pt verbalizes understanding of d/c instructions and denies any further needs at this time. 

## 2015-06-18 NOTE — ED Provider Notes (Signed)
CSN: [id number]     Arrival date & time 06/18/15  1548 History   This chart was scribed for Montine Circle, PA-C working with Lajean Saver, MD by Randa Evens, ED Scribe. This patient was seen in room TR03C/TR03C and the patient's care was started at 4:34 PM.    Chief Complaint  Patient presents with  . Asthma   The history is provided by the patient. No language interpreter was used.   HPI Comments: Wendy Hoffman is a 49 y.o. female who presents to the Emergency Department complaining of cough onset 2 days prior. Pt presents with associated chest tightness and wheezing. She states she is having an asthma exacerbation due to the heat. She states she has tried at home nebulizer treatments and her albuterol inhaler with no relief. Denies fever, nausea or vomiting.   Past Medical History  Diagnosis Date  . Asthma   . Thyroid disease   . Hypertension   . High cholesterol   . Migraine   . Morbid obesity   . H/O: hysterectomy   . Medical history non-contributory   . Hypothyroidism   . Anxiety   . Shortness of breath   . Stroke   . Arthritis   . Anemia   . Cancer     cervic   Past Surgical History  Procedure Laterality Date  . Cesarean section      3 occasions  . Thyroidectomy, partial      Goiter  . Cholecystectomy    . Abdominal hysterectomy     Family History  Problem Relation Age of Onset  . Migraines Sister    History  Substance Use Topics  . Smoking status: Former Research scientist (life sciences)  . Smokeless tobacco: Never Used  . Alcohol Use: No   OB History    No data available      Review of Systems  Constitutional: Negative for fever and chills.  Respiratory: Positive for cough, chest tightness and wheezing. Negative for shortness of breath.   Cardiovascular: Negative for chest pain.  Gastrointestinal: Negative for nausea, vomiting, diarrhea and constipation.  Genitourinary: Negative for dysuria.     Allergies  Blueberry; Mango flavor; Aspirin; and Penicillins  Home  Medications   Prior to Admission medications   Medication Sig Start Date End Date Taking? Authorizing Provider  albuterol (PROVENTIL HFA;VENTOLIN HFA) 108 (90 BASE) MCG/ACT inhaler Inhale 2 puffs into the lungs every 4 (four) hours as needed for wheezing or shortness of breath (wheezing). 11/24/14   Serita Grit, MD  albuterol (PROVENTIL) (2.5 MG/3ML) 0.083% nebulizer solution Take 3 mLs (2.5 mg total) by nebulization every 4 (four) hours as needed for wheezing or shortness of breath. 02/09/15   Courtney Forcucci, PA-C  cyclobenzaprine (FLEXERIL) 10 MG tablet Take 1 tablet (10 mg total) by mouth 3 (three) times daily as needed for muscle spasms. Patient not taking: Reported on 02/09/2015 09/09/14   Mercedes Camprubi-Soms, PA-C  hydrochlorothiazide (HYDRODIURIL) 25 MG tablet Take 1 tablet (25 mg total) by mouth at bedtime. 04/25/14   Annita Brod, MD  hydrochlorothiazide (HYDRODIURIL) 25 MG tablet Take 1 tablet (25 mg total) by mouth daily. 02/09/15   Courtney Forcucci, PA-C  ibuprofen (ADVIL,MOTRIN) 200 MG tablet Take 800 mg by mouth every 6 (six) hours as needed for headache, mild pain or moderate pain.    Historical Provider, MD  naproxen (NAPROSYN) 500 MG tablet Take 1 tablet (500 mg total) by mouth 2 (two) times daily as needed for mild pain, moderate pain or headache (TAKE  WITH MEALS.). Patient not taking: Reported on 02/09/2015 09/09/14   Mercedes Camprubi-Soms, PA-C  naproxen sodium (ANAPROX) 220 MG tablet Take 880 mg by mouth daily as needed (pain).     Historical Provider, MD  oxyCODONE-acetaminophen (PERCOCET) 5-325 MG per tablet Take 1-2 tablets by mouth every 6 (six) hours as needed for severe pain. Patient not taking: Reported on 02/09/2015 09/09/14   Mercedes Camprubi-Soms, PA-C  predniSONE (DELTASONE) 20 MG tablet 2 tabs po daily x 5 days 03/14/15   Al Corpus, PA-C   BP 155/79 mmHg  Pulse 88  Temp(Src) 98.6 F (37 C) (Oral)  Resp 22  Ht 5\' 4"  (1.626 m)  Wt 280 lb (127.007 kg)   BMI 48.04 kg/m2  SpO2 98%   Physical Exam  Constitutional: She is oriented to person, place, and time. She appears well-developed and well-nourished. No distress.  HENT:  Head: Normocephalic and atraumatic.  Eyes: Conjunctivae and EOM are normal.  Neck: Neck supple. No tracheal deviation present.  Cardiovascular: Normal rate.   Pulmonary/Chest: Effort normal. No respiratory distress. She has wheezes.  Diffuse wheezing throughout.   Musculoskeletal: Normal range of motion.  Neurological: She is alert and oriented to person, place, and time.  Skin: Skin is warm and dry.  Psychiatric: She has a normal mood and affect. Her behavior is normal.  Nursing note and vitals reviewed.   ED Course  Procedures (including critical care time) DIAGNOSTIC STUDIES: Oxygen Saturation is 94% on RA, adequate by my interpretation.    COORDINATION OF CARE: 5:03 PM-Discussed treatment plan with pt at bedside and pt agreed to plan.     Labs Review Labs Reviewed - No data to display  Imaging Review Dg Chest 2 View  06/18/2015   CLINICAL DATA:  49 year old female with asthma and shortness of breath.  EXAM: CHEST  2 VIEW  COMPARISON:  Chest radiograph dated 03/14/2015  FINDINGS: The heart size and mediastinal contours are within normal limits. Both lungs are clear. The visualized skeletal structures are unremarkable.  IMPRESSION: No active cardiopulmonary disease.   Electronically Signed   By: Anner Crete M.D.   On: 06/18/2015 16:28     EKG Interpretation None      MDM   Final diagnoses:  Asthma exacerbation   Patient reassessed, still has some wheezing, will give additional nebs.  6:02 PM Patient reassessed, feels well.  Patient ambulated in ED with O2 saturations maintained >90, no current signs of respiratory distress. Lung exam improved after nebulizer treatment. Prednisone given in the ED and pt will bd dc with 5 day burst. Pt states they are breathing at baseline. Pt has been  instructed to continue using prescribed medications and to speak with PCP about today's exacerbation.   I personally performed the services described in this documentation, which was scribed in my presence. The recorded information has been reviewed and is accurate.     Montine Circle, PA-C 06/18/15 [place], MD 06/23/15 [phone]

## 2015-06-18 NOTE — ED Notes (Addendum)
Pt from home for eval of increased wheezing that started yesterday, pt states hx of asthma and reports increase usage of inhaler and nebulizer treatment. Pt denies any cp at this time, answering questions appopriately. Pt also reports productive cough with clear flem production. Pt has wheezing noted.

## 2015-06-18 NOTE — ED Notes (Signed)
Pt's O2 saturations stayed at 98% while ambulating and pt denies SOB

## 2015-09-19 ENCOUNTER — Emergency Department (HOSPITAL_COMMUNITY): Payer: BLUE CROSS/BLUE SHIELD

## 2015-09-19 ENCOUNTER — Encounter (HOSPITAL_COMMUNITY): Payer: Self-pay | Admitting: Emergency Medicine

## 2015-09-19 ENCOUNTER — Emergency Department (HOSPITAL_COMMUNITY)
Admission: EM | Admit: 2015-09-19 | Discharge: 2015-09-20 | Disposition: A | Discharge status: Home / Self Care | Payer: BLUE CROSS/BLUE SHIELD | Attending: Emergency Medicine | Admitting: Emergency Medicine

## 2015-09-19 DIAGNOSIS — Z8659 Personal history of other mental and behavioral disorders: Secondary | ICD-10-CM | POA: Insufficient documentation

## 2015-09-19 DIAGNOSIS — M1990 Unspecified osteoarthritis, unspecified site: Secondary | ICD-10-CM | POA: Diagnosis not present

## 2015-09-19 DIAGNOSIS — Z862 Personal history of diseases of the blood and blood-forming organs and certain disorders involving the immune mechanism: Secondary | ICD-10-CM | POA: Diagnosis not present

## 2015-09-19 DIAGNOSIS — R609 Edema, unspecified: Secondary | ICD-10-CM

## 2015-09-19 DIAGNOSIS — R600 Localized edema: Secondary | ICD-10-CM | POA: Insufficient documentation

## 2015-09-19 DIAGNOSIS — J45901 Unspecified asthma with (acute) exacerbation: Secondary | ICD-10-CM | POA: Diagnosis not present

## 2015-09-19 DIAGNOSIS — Z8541 Personal history of malignant neoplasm of cervix uteri: Secondary | ICD-10-CM | POA: Insufficient documentation

## 2015-09-19 DIAGNOSIS — Z880 Allergy status to penicillin: Secondary | ICD-10-CM | POA: Insufficient documentation

## 2015-09-19 DIAGNOSIS — Z8679 Personal history of other diseases of the circulatory system: Secondary | ICD-10-CM | POA: Diagnosis not present

## 2015-09-19 DIAGNOSIS — R51 Headache: Secondary | ICD-10-CM | POA: Insufficient documentation

## 2015-09-19 DIAGNOSIS — Z8673 Personal history of transient ischemic attack (TIA), and cerebral infarction without residual deficits: Secondary | ICD-10-CM | POA: Insufficient documentation

## 2015-09-19 DIAGNOSIS — R0602 Shortness of breath: Secondary | ICD-10-CM | POA: Diagnosis present

## 2015-09-19 DIAGNOSIS — I10 Essential (primary) hypertension: Secondary | ICD-10-CM | POA: Insufficient documentation

## 2015-09-19 LAB — CBC WITH DIFFERENTIAL/PLATELET
Basophils Absolute: 0 10*3/uL (ref 0.0–0.1)
Basophils Relative: 0 %
EOS ABS: 0.2 10*3/uL (ref 0.0–0.7)
EOS PCT: 4 %
HCT: 34.3 % — ABNORMAL LOW (ref 36.0–46.0)
Hemoglobin: 11.3 g/dL — ABNORMAL LOW (ref 12.0–15.0)
LYMPHS ABS: 2.3 10*3/uL (ref 0.7–4.0)
LYMPHS PCT: 38 %
MCH: 30.1 pg (ref 26.0–34.0)
MCHC: 32.9 g/dL (ref 30.0–36.0)
MCV: 91.2 fL (ref 78.0–100.0)
MONO ABS: 0.3 10*3/uL (ref 0.1–1.0)
Monocytes Relative: 5 %
Neutro Abs: 3.1 10*3/uL (ref 1.7–7.7)
Neutrophils Relative %: 53 %
PLATELETS: 311 10*3/uL (ref 150–400)
RBC: 3.76 MIL/uL — ABNORMAL LOW (ref 3.87–5.11)
RDW: 13.8 % (ref 11.5–15.5)
WBC: 5.9 10*3/uL (ref 4.0–10.5)

## 2015-09-19 MED ORDER — IPRATROPIUM-ALBUTEROL 0.5-2.5 (3) MG/3ML IN SOLN
3.0000 mL | Freq: Once | RESPIRATORY_TRACT | Status: AC
  Administered 2015-09-19: 3 mL via RESPIRATORY_TRACT
  Filled 2015-09-19: qty 3

## 2015-09-19 MED ORDER — PREDNISONE 20 MG PO TABS
60.0000 mg | ORAL_TABLET | Freq: Once | ORAL | Status: AC
  Administered 2015-09-20: 60 mg via ORAL
  Filled 2015-09-19: qty 3

## 2015-09-19 NOTE — ED Provider Notes (Signed)
CSN: [id number]     Arrival date & time 09/19/15  1809 History   By signing my name below, I, Wendy Hoffman, attest that this documentation has been prepared under the direction and in the presence of Stephani Janak, MD. Electronically Signed: Forrestine Hoffman, ED Scribe. 09/19/2015. 11:59 PM.   Chief Complaint  Patient presents with  . Shortness of Breath  . Leg Swelling  . Headache   Patient is a 49 y.o. female presenting with shortness of breath. The history is provided by the patient. No language interpreter was used.  Shortness of Breath Severity:  Mild Onset quality:  Gradual Duration:  1 day Timing:  Intermittent Progression:  Resolved Chronicity:  Recurrent Context: not activity and not known allergens   Relieved by:  None tried Worsened by:  Activity Ineffective treatments:  None tried Associated symptoms: headaches and wheezing   Associated symptoms: no abdominal pain, no chest pain, no cough, no fever, no rash and no vomiting   Risk factors: no recent alcohol use, no hx of PE/DVT and no prolonged immobilization     HPI Comments: Wendy Hoffman is a 49 y.o. female with a PMHx of thyroid disease, HTN, hyperlipidemia, stroke, and cancer who presents to the Emergency Department complaining of constant, ongoing bilateral leg swelling x 2 days. Ongoing associated pain to legs also reported. 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 No aggravating or alleviating factors at this time. Wendy Hoffman also mention ongoing shortness of breath and wheezing which she states is consistent with asthma exacerbations. Neb treatment given with improvement for symptoms. No recent fever, chills, nausea, vomiting, or chest pain. Pt with known allergies to ASA and penicillins.   Past Medical History  Diagnosis Date  . Asthma   . Thyroid disease   . Hypertension   . High cholesterol   . Migraine   . Morbid obesity ([place])   . H/O: hysterectomy   . Medical history  non-contributory   . Hypothyroidism   . Anxiety   . Shortness of breath   . Stroke ([place])   . Arthritis   . Anemia   . Cancer [facility])     cervic   Past Surgical History  Procedure Laterality Date  . Cesarean section      3 occasions  . Thyroidectomy, partial      Goiter  . Cholecystectomy    . Abdominal hysterectomy     Family History  Problem Relation Age of Onset  . Migraines Sister    Social History  Substance Use Topics  . Smoking status: Former Research scientist (life sciences)  . Smokeless tobacco: Never Used  . Alcohol Use: No   OB History    No data available     Review of Systems  Constitutional: Negative for fever and chills.  Respiratory: Positive for shortness of breath and wheezing. Negative for cough.   Cardiovascular: Positive for leg swelling. Negative for chest pain and palpitations.  Gastrointestinal: Negative for nausea, vomiting and abdominal pain.  Musculoskeletal: Negative for back pain.  Skin: Negative for rash.  Neurological: Positive for headaches.  Psychiatric/Behavioral: Negative for confusion.  All other systems reviewed and are negative.     Allergies  Blueberry; Mango flavor; Aspirin; and Penicillins  Home Medications   Prior to Admission medications   Medication Sig Start Date End Date Taking? Authorizing Provider  albuterol (PROVENTIL HFA;VENTOLIN HFA) 108 (90 BASE) MCG/ACT inhaler Inhale 2 puffs into the lungs every 4 (four) hours  as needed for wheezing or shortness of breath (wheezing). Patient not taking: Reported on 09/19/2015 11/24/14   Serita Grit, MD  albuterol (PROVENTIL) (2.5 MG/3ML) 0.083% nebulizer solution Take 3 mLs (2.5 mg total) by nebulization every 4 (four) hours as needed for wheezing or shortness of breath. Patient not taking: Reported on 09/19/2015 02/09/15   Starlyn Skeans, PA-C  cyclobenzaprine (FLEXERIL) 10 MG tablet Take 1 tablet (10 mg total) by mouth 3 (three) times daily as needed for muscle spasms. Patient not taking: Reported  on 02/09/2015 09/09/14   Mercedes Camprubi-Soms, PA-C  hydrochlorothiazide (HYDRODIURIL) 25 MG tablet Take 1 tablet (25 mg total) by mouth at bedtime. Patient not taking: Reported on 09/19/2015 04/25/14   Annita Brod, MD  hydrochlorothiazide (HYDRODIURIL) 25 MG tablet Take 1 tablet (25 mg total) by mouth daily. Patient not taking: Reported on 09/19/2015 02/09/15   Loma Sousa Forcucci, PA-C  naproxen (NAPROSYN) 500 MG tablet Take 1 tablet (500 mg total) by mouth 2 (two) times daily as needed for mild pain, moderate pain or headache (TAKE WITH MEALS.). Patient not taking: Reported on 02/09/2015 09/09/14   Mercedes Camprubi-Soms, PA-C  oxyCODONE-acetaminophen (PERCOCET) 5-325 MG per tablet Take 1-2 tablets by mouth every 6 (six) hours as needed for severe pain. Patient not taking: Reported on 02/09/2015 09/09/14   Mercedes Camprubi-Soms, PA-C  predniSONE (DELTASONE) 20 MG tablet Take 2 tablets (40 mg total) by mouth daily. Patient not taking: Reported on 09/19/2015 06/18/15   Montine Circle, PA-C   Triage Vitals: BP 128/86 mmHg  Pulse 78  Temp(Src) 98 F (36.7 C) (Oral)  Resp 20  SpO2 99%   Physical Exam  Constitutional: She is oriented to person, place, and time. She appears well-developed and well-nourished. No distress.  HENT:  Head: Normocephalic and atraumatic.  Mouth/Throat: Oropharynx is clear and moist. No oropharyngeal exudate.  Trachea is midline  Eyes: EOM are normal. Pupils are equal, round, and reactive to light.  Neck: Normal range of motion.  Cardiovascular: Normal rate, regular rhythm and normal heart sounds.   No carotid bruit   Pulmonary/Chest: Effort normal and breath sounds normal. No stridor. No respiratory distress. She has no wheezes. She has no rales.  Abdominal: Soft. Bowel sounds are normal. She exhibits no distension. There is no tenderness. There is no rebound and no guarding.  Musculoskeletal: Normal range of motion.  Neurological: She is alert and oriented to  person, place, and time.  Skin: Skin is warm and dry.  Psychiatric: She has a normal mood and affect. Judgment normal.  Nursing note and vitals reviewed.   ED Course  Procedures (including critical care time)  DIAGNOSTIC STUDIES: Oxygen Saturation is 99% on RA, Normal by my interpretation.    COORDINATION OF CARE: 11:24 PM- Will order CXR, D-dimer, CBC, i-stat chem 8. Will give Prednisone and Duoneb. Discussed treatment plan with pt at bedside and pt agreed to plan.     Labs Review Labs Reviewed  CBC WITH DIFFERENTIAL/PLATELET  I-STAT CHEM 8, ED    Imaging Review Dg Chest 2 View  09/19/2015   CLINICAL DATA:  Shortness of breath.  EXAM: CHEST  2 VIEW  COMPARISON:  June 18, 2015.  FINDINGS: The heart size and mediastinal contours are within normal limits. Both lungs are clear. No pneumothorax or pleural effusion is noted. The visualized skeletal structures are unremarkable.  IMPRESSION: No active cardiopulmonary disease.   Electronically Signed   By: Marijo Conception, M.D.   On: 09/19/2015 19:17   I  have personally reviewed and evaluated these images and lab results as part of my medical decision-making.   EKG Interpretation None      MDM   Final diagnoses:  None    Medications  ipratropium-albuterol (DUONEB) 0.5-2.5 (3) MG/3ML nebulizer solution 3 mL (3 mLs Nebulization Given 09/19/15 2257)  predniSONE (DELTASONE) tablet 60 mg (60 mg Oral Given 09/20/15 0004)   Results for orders placed or performed during the hospital encounter of 09/19/15  CBC with Differential/Platelet  Result Value Ref Range   WBC 5.9 4.0 - 10.5 K/uL   RBC 3.76 (L) 3.87 - 5.11 MIL/uL   Hemoglobin 11.3 (L) 12.0 - 15.0 g/dL   HCT 34.3 (L) 36.0 - 46.0 %   MCV 91.2 78.0 - 100.0 fL   MCH 30.1 26.0 - 34.0 pg   MCHC 32.9 30.0 - 36.0 g/dL   RDW 13.8 11.5 - 15.5 %   Platelets 311 150 - 400 K/uL   Neutrophils Relative % 53 %   Neutro Abs 3.1 1.7 - 7.7 K/uL   Lymphocytes Relative 38 %   Lymphs Abs 2.3 0.7  - 4.0 K/uL   Monocytes Relative 5 %   Monocytes Absolute 0.3 0.1 - 1.0 K/uL   Eosinophils Relative 4 %   Eosinophils Absolute 0.2 0.0 - 0.7 K/uL   Basophils Relative 0 %   Basophils Absolute 0.0 0.0 - 0.1 K/uL  D-dimer, quantitative (not at [facility])  Result Value Ref Range   D-Dimer, Quant <0.27 0.00 - 0.48 ug/mL-FEU  I-Stat Chem 8, ED  Result Value Ref Range   Sodium 140 135 - 145 mmol/L   Potassium 3.6 3.5 - 5.1 mmol/L   Chloride 101 101 - 111 mmol/L   BUN 10 6 - 20 mg/dL   Creatinine, Ser 0.80 0.44 - 1.00 mg/dL   Glucose, Bld 104 (H) 65 - 99 mg/dL   Calcium, Ion 1.22 1.12 - 1.23 mmol/L   TCO2 24 0 - 100 mmol/L   Hemoglobin 11.6 (L) 12.0 - 15.0 g/dL   HCT 34.0 (L) 36.0 - 46.0 %   Dg Chest 2 View  09/19/2015   CLINICAL DATA:  Shortness of breath.  EXAM: CHEST  2 VIEW  COMPARISON:  June 18, 2015.  FINDINGS: The heart size and mediastinal contours are within normal limits. Both lungs are clear. No pneumothorax or pleural effusion is noted. The visualized skeletal structures are unremarkable.  IMPRESSION: No active cardiopulmonary disease.   Electronically Signed   By: Marijo Conception, M.D.   On: 09/19/2015 19:17      I, Joseluis Alessio-RASCH,Kamilah Correia K, personally performed the services described in this documentation. All medical record entries made by the scribe were at my direction and in my presence.  I have reviewed the chart and discharge instructions and agree that the record reflects my personal performance and is accurate and complete. Sumayya Muha-RASCH,Ciria Bernardini K.  09/20/2015. 2:04 AM.     Shaneequa Bahner, MD 09/20/15 [phone]

## 2015-09-19 NOTE — ED Notes (Addendum)
Pt reports sob from asthma. Expiratory wheezing noted. Pt speaking in short sentences.  Also reports ankle pain and swelling that has spread to foot and up leg to hip. No obvious swelling noted.  Pt also has bilateral HA with pain in temples.

## 2015-09-20 ENCOUNTER — Encounter (HOSPITAL_COMMUNITY): Payer: Self-pay | Admitting: Emergency Medicine

## 2015-09-20 LAB — I-STAT CHEM 8, ED
BUN: 10 mg/dL (ref 6–20)
CHLORIDE: 101 mmol/L (ref 101–111)
Calcium, Ion: 1.22 mmol/L (ref 1.12–1.23)
Creatinine, Ser: 0.8 mg/dL (ref 0.44–1.00)
Glucose, Bld: 104 mg/dL — ABNORMAL HIGH (ref 65–99)
HEMATOCRIT: 34 % — AB (ref 36.0–46.0)
HEMOGLOBIN: 11.6 g/dL — AB (ref 12.0–15.0)
POTASSIUM: 3.6 mmol/L (ref 3.5–5.1)
SODIUM: 140 mmol/L (ref 135–145)
TCO2: 24 mmol/L (ref 0–100)

## 2015-09-20 LAB — D-DIMER, QUANTITATIVE (NOT AT ARMC)

## 2015-09-20 MED ORDER — PREDNISONE 20 MG PO TABS: ORAL_TABLET | ORAL | Status: DC

## 2015-09-20 NOTE — Discharge Instructions (Signed)
Asthma, Adult Asthma is a condition of the lungs in which the airways tighten and narrow. Asthma can make it hard to breathe. Asthma cannot be cured, but medicine and lifestyle changes can help control it. Asthma may be started (triggered) by:  Animal skin flakes (dander).  Dust.  Cockroaches.  Pollen.  Mold.  Smoke.  Cleaning products.  Hair sprays or aerosol sprays.  Paint fumes or strong smells.  Cold air, weather changes, and winds.  Crying or laughing hard.  Stress.  Certain medicines or drugs.  Foods, such as dried fruit, potato chips, and sparkling grape juice.  Infections or conditions (colds, flu).  Exercise.  Certain medical conditions or diseases.  Exercise or tiring activities. HOME CARE   Take medicine as told by your doctor.  Use a peak flow meter as told by your doctor. A peak flow meter is a tool that measures how well the lungs are working.  Record and keep track of the peak flow meter's readings.  Understand and use the asthma action plan. An asthma action plan is a written plan for taking care of your asthma and treating your attacks.  To help prevent asthma attacks:  Do not smoke. Stay away from secondhand smoke.  Change your heating and air conditioning filter often.  Limit your use of fireplaces and wood stoves.  Get rid of pests (such as roaches and mice) and their droppings.  Throw away plants if you see mold on them.  Clean your floors. Dust regularly. Use cleaning products that do not smell.  Have someone vacuum when you are not home. Use a vacuum cleaner with a HEPA filter if possible.  Replace carpet with wood, tile, or vinyl flooring. Carpet can trap animal skin flakes and dust.  Use allergy-proof pillows, mattress covers, and box spring covers.  Wash bed sheets and blankets every week in hot water and dry them in a dryer.  Use blankets that are made of polyester or cotton.  Clean bathrooms and kitchens with bleach.  If possible, have someone repaint the walls in these rooms with mold-resistant paint. Keep out of the rooms that are being cleaned and painted.  Wash hands often. GET HELP IF:  You have make a whistling sound when breaking (wheeze), have shortness of breath, or have a cough even if taking medicine to prevent attacks.  The colored mucus you cough up (sputum) is thicker than usual.  The colored mucus you cough up changes from clear or white to yellow, green, gray, or bloody.  You have problems from the medicine you are taking such as:  A rash.  Itching.  Swelling.  Trouble breathing.  You need reliever medicines more than 2-3 times a week.  Your peak flow measurement is still at 50-79% of your personal best after following the action plan for 1 hour.  You have a fever. GET HELP RIGHT AWAY IF:   You seem to be worse and are not responding to medicine during an asthma attack.  You are short of breath even at rest.  You get short of breath when doing very little activity.  You have trouble eating, drinking, or talking.  You have chest pain.  You have a fast heartbeat.  Your lips or fingernails start to turn blue.  You are light-headed, dizzy, or faint.  Your peak flow is less than 50% of your personal best.   This information is not intended to replace advice given to you by your health care provider. Make sure   you discuss any questions you have with your health care provider.   Document Released: 05/17/2008 Document Revised: 08/20/2015 Document Reviewed: 06/28/2013 Elsevier Interactive Patient Education 2016 [employer]  

## 2015-10-02 ENCOUNTER — Encounter (HOSPITAL_COMMUNITY): Payer: Self-pay | Admitting: *Deleted

## 2015-10-02 ENCOUNTER — Emergency Department (HOSPITAL_COMMUNITY)
Admission: EM | Admit: 2015-10-02 | Discharge: 2015-10-03 | Disposition: A | Discharge status: Home / Self Care | Payer: BLUE CROSS/BLUE SHIELD | Attending: Emergency Medicine | Admitting: Emergency Medicine

## 2015-10-02 ENCOUNTER — Emergency Department (HOSPITAL_COMMUNITY): Payer: BLUE CROSS/BLUE SHIELD

## 2015-10-02 DIAGNOSIS — Z8541 Personal history of malignant neoplasm of cervix uteri: Secondary | ICD-10-CM | POA: Diagnosis not present

## 2015-10-02 DIAGNOSIS — R51 Headache: Secondary | ICD-10-CM | POA: Diagnosis not present

## 2015-10-02 DIAGNOSIS — Z8673 Personal history of transient ischemic attack (TIA), and cerebral infarction without residual deficits: Secondary | ICD-10-CM | POA: Diagnosis not present

## 2015-10-02 DIAGNOSIS — J45901 Unspecified asthma with (acute) exacerbation: Secondary | ICD-10-CM | POA: Insufficient documentation

## 2015-10-02 DIAGNOSIS — Z79899 Other long term (current) drug therapy: Secondary | ICD-10-CM | POA: Insufficient documentation

## 2015-10-02 DIAGNOSIS — Z7951 Long term (current) use of inhaled steroids: Secondary | ICD-10-CM | POA: Diagnosis not present

## 2015-10-02 DIAGNOSIS — I10 Essential (primary) hypertension: Secondary | ICD-10-CM | POA: Insufficient documentation

## 2015-10-02 DIAGNOSIS — Z87891 Personal history of nicotine dependence: Secondary | ICD-10-CM | POA: Insufficient documentation

## 2015-10-02 DIAGNOSIS — Z880 Allergy status to penicillin: Secondary | ICD-10-CM | POA: Diagnosis not present

## 2015-10-02 DIAGNOSIS — M1990 Unspecified osteoarthritis, unspecified site: Secondary | ICD-10-CM | POA: Diagnosis not present

## 2015-10-02 DIAGNOSIS — Z862 Personal history of diseases of the blood and blood-forming organs and certain disorders involving the immune mechanism: Secondary | ICD-10-CM | POA: Diagnosis not present

## 2015-10-02 DIAGNOSIS — R0602 Shortness of breath: Secondary | ICD-10-CM | POA: Diagnosis present

## 2015-10-02 DIAGNOSIS — Z8659 Personal history of other mental and behavioral disorders: Secondary | ICD-10-CM | POA: Insufficient documentation

## 2015-10-02 LAB — BASIC METABOLIC PANEL
Anion gap: 6 (ref 5–15)
BUN: 16 mg/dL (ref 6–20)
CALCIUM: 9.1 mg/dL (ref 8.9–10.3)
CO2: 26 mmol/L (ref 22–32)
Chloride: 106 mmol/L (ref 101–111)
Creatinine, Ser: 0.72 mg/dL (ref 0.44–1.00)
GFR calc Af Amer: >60 mL/min (ref >60)
GFR calc non Af Amer: >60 mL/min (ref >60)
GLUCOSE: 100 mg/dL — AB (ref 65–99)
Potassium: 3.8 mmol/L (ref 3.5–5.1)
SODIUM: 138 mmol/L (ref 135–145)

## 2015-10-02 LAB — CBC WITH DIFFERENTIAL/PLATELET
Basophils Absolute: 0 10*3/uL (ref 0.0–0.1)
Basophils Relative: 0 %
EOS ABS: 0.2 10*3/uL (ref 0.0–0.7)
EOS PCT: 3 %
HCT: 35.2 % — ABNORMAL LOW (ref 36.0–46.0)
Hemoglobin: 11.8 g/dL — ABNORMAL LOW (ref 12.0–15.0)
LYMPHS ABS: 2.3 10*3/uL (ref 0.7–4.0)
Lymphocytes Relative: 32 %
MCH: 30.6 pg (ref 26.0–34.0)
MCHC: 33.5 g/dL (ref 30.0–36.0)
MCV: 91.2 fL (ref 78.0–100.0)
MONO ABS: 0.3 10*3/uL (ref 0.1–1.0)
MONOS PCT: 4 %
Neutro Abs: 4.3 10*3/uL (ref 1.7–7.7)
Neutrophils Relative %: 61 %
PLATELETS: 273 10*3/uL (ref 150–400)
RBC: 3.86 MIL/uL — ABNORMAL LOW (ref 3.87–5.11)
RDW: 14 % (ref 11.5–15.5)
WBC: 7.1 10*3/uL (ref 4.0–10.5)

## 2015-10-02 LAB — D-DIMER, QUANTITATIVE: D-Dimer, Quant: 0.3 ug/mL-FEU (ref 0.00–0.48)

## 2015-10-02 MED ORDER — DEXAMETHASONE SODIUM PHOSPHATE 10 MG/ML IJ SOLN
6.0000 mg | Freq: Once | INTRAMUSCULAR | Status: AC
  Administered 2015-10-02: 6 mg via INTRAMUSCULAR
  Filled 2015-10-02: qty 1

## 2015-10-02 MED ORDER — HYDROCODONE-ACETAMINOPHEN 5-325 MG PO TABS
2.0000 | ORAL_TABLET | Freq: Once | ORAL | Status: AC
  Administered 2015-10-02: 2 via ORAL
  Filled 2015-10-02: qty 2

## 2015-10-02 MED ORDER — ALBUTEROL SULFATE (2.5 MG/3ML) 0.083% IN NEBU
5.0000 mg | INHALATION_SOLUTION | Freq: Once | RESPIRATORY_TRACT | Status: AC
  Administered 2015-10-02: 5 mg via RESPIRATORY_TRACT
  Filled 2015-10-02: qty 6

## 2015-10-02 MED ORDER — IPRATROPIUM BROMIDE 0.02 % IN SOLN
0.5000 mg | Freq: Once | RESPIRATORY_TRACT | Status: AC
  Administered 2015-10-02: 0.5 mg via RESPIRATORY_TRACT
  Filled 2015-10-02: qty 2.5

## 2015-10-02 NOTE — ED Notes (Signed)
Pt stated "I don't have a PCP, I just got insurance.  This started yesterday.  I started coughing today; do not cough anything up.  I used my brother's albuterol inhaler and his symbicort."

## 2015-10-02 NOTE — ED Provider Notes (Signed)
CSN: [id number]     Arrival date & time 10/02/15  1725 History   First MD Initiated Contact with Patient 10/02/15 2118     Chief Complaint  Patient presents with  . Asthma  . Shortness of Breath    HPI   Wendy Hoffman is a 49 y.o. female with a PMH of asthma, HTN, HLD, anxiety who presents to the ED with asthma exacerbation. She states her symptoms started yesterday and have been persistent since that time. She usually takes albuterol and symbicort for her asthma, but states she ran out of her symbicort. She reports change in seasons triggers her asthma. She denies fever, chills. She reports non-productive cough. She denies nasal congestion. She reports chest pain and shortness of breath, which she attributes to her asthma. She states her symptoms are consistent with her typical asthma exacerbation. She denies abdominal pain, nausea, vomiting, diarrhea, constipation, numbness, weakness, paresthesia.   Past Medical History  Diagnosis Date  . Asthma   . Thyroid disease   . Hypertension   . High cholesterol   . Migraine   . Morbid obesity ([facility])   . H/O: hysterectomy   . Medical history non-contributory   . Hypothyroidism   . Anxiety   . Shortness of breath   . Stroke ([facility])   . Arthritis   . Anemia   . Cancer [facility])     cervic   Past Surgical History  Procedure Laterality Date  . Cesarean section      3 occasions  . Thyroidectomy, partial      Goiter  . Cholecystectomy    . Abdominal hysterectomy     Family History  Problem Relation Age of Onset  . Migraines Sister    Social History  Substance Use Topics  . Smoking status: Former Research scientist (life sciences)  . Smokeless tobacco: Never Used  . Alcohol Use: No   OB History    No data available     Review of Systems  Constitutional: Negative for fever and chills.  Respiratory: Positive for cough and shortness of breath.   Cardiovascular: Positive for chest pain.  Gastrointestinal: Negative for nausea, vomiting, abdominal pain,  diarrhea and constipation.  Neurological: Positive for headaches. Negative for dizziness, syncope and light-headedness.  All other systems reviewed and are negative.     Allergies  Blueberry; Mango flavor; Aspirin; and Penicillins  Home Medications   Prior to Admission medications   Medication Sig Start Date End Date Taking? Authorizing Provider  albuterol (PROVENTIL HFA;VENTOLIN HFA) 108 (90 BASE) MCG/ACT inhaler Inhale 2 puffs into the lungs every 4 (four) hours as needed for wheezing or shortness of breath (wheezing). 11/24/14  Yes Serita Grit, MD  Budesonide-Formoterol Fumarate (SYMBICORT IN) Inhale 2 puffs into the lungs 2 (two) times daily.   Yes Historical Provider, MD  Ibuprofen (ADVIL MIGRAINE PO) Take 1 tablet by mouth daily as needed (pain).   Yes Historical Provider, MD  albuterol (PROVENTIL) (2.5 MG/3ML) 0.083% nebulizer solution Take 3 mLs (2.5 mg total) by nebulization every 4 (four) hours as needed for wheezing or shortness of breath. Patient not taking: Reported on 09/19/2015 02/09/15   Starlyn Skeans, PA-C  cyclobenzaprine (FLEXERIL) 10 MG tablet Take 1 tablet (10 mg total) by mouth 3 (three) times daily as needed for muscle spasms. Patient not taking: Reported on 02/09/2015 09/09/14   Mercedes Camprubi-Soms, PA-C  hydrochlorothiazide (HYDRODIURIL) 25 MG tablet Take 1 tablet (25 mg total) by mouth at bedtime. Patient not taking: Reported on 09/19/2015 04/25/14  Annita Brod, MD  hydrochlorothiazide (HYDRODIURIL) 25 MG tablet Take 1 tablet (25 mg total) by mouth daily. Patient not taking: Reported on 09/19/2015 02/09/15   Loma Sousa Forcucci, PA-C  naproxen (NAPROSYN) 500 MG tablet Take 1 tablet (500 mg total) by mouth 2 (two) times daily as needed for mild pain, moderate pain or headache (TAKE WITH MEALS.). Patient not taking: Reported on 02/09/2015 09/09/14   Mercedes Camprubi-Soms, PA-C  oxyCODONE-acetaminophen (PERCOCET) 5-325 MG per tablet Take 1-2 tablets by mouth  every 6 (six) hours as needed for severe pain. Patient not taking: Reported on 02/09/2015 09/09/14   Mercedes Camprubi-Soms, PA-C  predniSONE (DELTASONE) 20 MG tablet Take 2 tablets (40 mg total) by mouth daily. Patient not taking: Reported on 09/19/2015 06/18/15   Montine Circle, PA-C  predniSONE (DELTASONE) 20 MG tablet 3 tabs po day one, then 2 po daily x 4 days Patient not taking: Reported on 10/02/2015 09/20/15   April Palumbo, MD    BP 147/92 mmHg  Pulse 103  Temp(Src) 98 F (36.7 C) (Oral)  Resp 16  Ht 5\' 4"  (1.626 m)  SpO2 100% Physical Exam  Constitutional: She is oriented to person, place, and time. She appears well-developed and well-nourished. No distress.  HENT:  Head: Normocephalic and atraumatic.  Right Ear: External ear normal.  Left Ear: External ear normal.  Nose: Nose normal.  Mouth/Throat: Uvula is midline, oropharynx is clear and moist and mucous membranes are normal.  Eyes: Conjunctivae, EOM and lids are normal. Pupils are equal, round, and reactive to light. Right eye exhibits no discharge. Left eye exhibits no discharge. No scleral icterus.  Neck: Normal range of motion. Neck supple.  Cardiovascular: Normal rate, regular rhythm, normal heart sounds, intact distal pulses and normal pulses.   Pulmonary/Chest: Effort normal. No respiratory distress. She has wheezes. She has no rales.  Diffuse wheezing to bilateral lung fields. No respiratory distress.  Abdominal: Soft. Normal appearance and bowel sounds are normal. She exhibits no distension and no mass. There is no tenderness. There is no rigidity, no rebound and no guarding.  Musculoskeletal: Normal range of motion. She exhibits no edema or tenderness.  Neurological: She is alert and oriented to person, place, and time. She has normal strength. No cranial nerve deficit or sensory deficit.  Skin: Skin is warm, dry and intact. No rash noted. She is not diaphoretic. No erythema. No pallor.  Psychiatric: She has a normal  mood and affect. Her speech is normal and behavior is normal. Judgment and thought content normal.  Nursing note and vitals reviewed.   ED Course  Procedures (including critical care time)  Labs Review Labs Reviewed  CBC WITH DIFFERENTIAL/PLATELET - Abnormal; Notable for the following:    RBC 3.86 (*)    Hemoglobin 11.8 (*)    HCT 35.2 (*)    All other components within normal limits  BASIC METABOLIC PANEL - Abnormal; Notable for the following:    Glucose, Bld 100 (*)    All other components within normal limits  D-DIMER, QUANTITATIVE (NOT AT [facility])    Imaging Review Dg Chest 2 View  10/02/2015  CLINICAL DATA:  Cough, wheezing and shortness of breath for 2 days. EXAM: CHEST  2 VIEW COMPARISON:  09/19/2015 FINDINGS: Perihilar peribronchial thickening. Mild right linear atelectasis. No consolidation, pleural effusion or pneumothorax. The cardiomediastinal contours are unchanged allowing for differences in lung volumes. No acute osseous abnormalities are seen. IMPRESSION: Bronchial thickening, bronchitis or asthma. Electronically Signed   By: Jeb Levering  M.D.   On: 10/02/2015 18:47   I have personally reviewed and evaluated these images and lab results as part of my medical decision-making.   EKG Interpretation None      MDM   Final diagnoses:  Asthma exacerbation    49 year old female presents with asthma exacerbation. She states her symptoms started yesterday. She reports nonproductive cough, shortness of breath, and chest pain. She states her symptoms are consistent with her typical asthma exacerbation.  Patient is afebrile. Mildly tachycardic. Heart regular rhythm. Lungs with diffuse wheezing bilaterally. No respiratory distress. Abdomen soft, nontender, nondistended. No lower extremity edema.   CBC negative for leukocytosis. Hemoglobin 11.3. BMP within normal limits. Chest x-ray with bronchial thickening, consistent with bronchitis or asthma. No consolidation, pleural  effusion, or pneumothorax. Will obtain d-dimer.  Patient given 2 breathing treatments and steroid injection in the ED. She reports improvement in shortness of breath, though states her chest pain is persistent. Given pain medication.   D-dimer negative. No recent travel or immobility, no recent surgery, no estrogen use, no history of DVT or PE, no lower extremity edema. Doubt PE.  Patient states she feels like she is back to her baseline. Mildly tachycardic, likely due to albuterol. O2 sat 98-100% on room air. No tachypnea. No wheezing on repeat lung exam. Patient is well-appearing, feel she is stable for discharge at this time. Will treat with pain medication, albuterol inhaler, symbicort, steroids. Patient to follow up with PCP within the next 2-3 days. Return precautions discussed at length. Patient verbalizes her understanding and is in agreement with plan.  BP 147/92 mmHg  Pulse 103  Temp(Src) 98 F (36.7 C) (Oral)  Resp 16  Ht 5\' 4"  (1.626 m)  SpO2 100%       Marella Chimes, PA-C 10/03/15 0106  Harvel Quale, MD 10/07/15 [phone]

## 2015-10-02 NOTE — ED Notes (Signed)
Pt remains on continuous pulse ox.

## 2015-10-03 MED ORDER — PREDNISONE 20 MG PO TABS: 40.0000 mg | ORAL_TABLET | Freq: Every day | ORAL | Status: DC

## 2015-10-03 MED ORDER — ALBUTEROL SULFATE HFA 108 (90 BASE) MCG/ACT IN AERS
2.0000 | INHALATION_SPRAY | Freq: Once | RESPIRATORY_TRACT | Status: AC
  Administered 2015-10-03: 2 via RESPIRATORY_TRACT
  Filled 2015-10-03: qty 6.7

## 2015-10-03 MED ORDER — HYDROCODONE-ACETAMINOPHEN 5-325 MG PO TABS: 2.0000 | ORAL_TABLET | ORAL | Status: DC | PRN

## 2015-10-03 MED ORDER — BUDESONIDE-FORMOTEROL FUMARATE 160-4.5 MCG/ACT IN AERO: 2.0000 | INHALATION_SPRAY | Freq: Two times a day (BID) | RESPIRATORY_TRACT | Status: DC

## 2015-10-03 NOTE — Discharge Instructions (Signed)
1. Medications: albuterol, symbicort, steroid (prednisone), pain medication (vicodin), usual home medications 2. Treatment: rest, drink plenty of fluids, use incentive spirometer 3. Follow Up: please followup with your primary doctor in 2-3 days for discussion of your diagnoses and further evaluation after today's visit; if you do not have a primary care doctor use the resource guide provided to find one; please return to the ER for severe chest pain, shortness of breath, high fever, new or worsening symptoms    Asthma, Adult Asthma is a recurring condition in which the airways tighten and narrow. Asthma can make it difficult to breathe. It can cause coughing, wheezing, and shortness of breath. Asthma episodes, also called asthma attacks, range from minor to life-threatening. Asthma cannot be cured, but medicines and lifestyle changes can help control it. CAUSES Asthma is believed to be caused by inherited (genetic) and environmental factors, but its exact cause is unknown. Asthma may be triggered by allergens, lung infections, or irritants in the air. Asthma triggers are different for each person. Common triggers include:   Animal dander.  Dust mites.  Cockroaches.  Pollen from trees or grass.  Mold.  Smoke.  Air pollutants such as dust, household cleaners, hair sprays, aerosol sprays, paint fumes, strong chemicals, or strong odors.  Cold air, weather changes, and winds (which increase molds and pollens in the air).  Strong emotional expressions such as crying or laughing hard.  Stress.  Certain medicines (such as aspirin) or types of drugs (such as beta-blockers).  Sulfites in foods and drinks. Foods and drinks that may contain sulfites include dried fruit, potato chips, and sparkling grape juice.  Infections or inflammatory conditions such as the flu, a cold, or an inflammation of the nasal membranes (rhinitis).  Gastroesophageal reflux disease (GERD).  Exercise or strenuous  activity. SYMPTOMS Symptoms may occur immediately after asthma is triggered or many hours later. Symptoms include:  Wheezing.  Excessive nighttime or early morning coughing.  Frequent or severe coughing with a common cold.  Chest tightness.  Shortness of breath. DIAGNOSIS  The diagnosis of asthma is made by a review of your medical history and a physical exam. Tests may also be performed. These may include:  Lung function studies. These tests show how much air you breathe in and out.  Allergy tests.  Imaging tests such as X-rays. TREATMENT  Asthma cannot be cured, but it can usually be controlled. Treatment involves identifying and avoiding your asthma triggers. It also involves medicines. There are 2 classes of medicine used for asthma treatment:   Controller medicines. These prevent asthma symptoms from occurring. They are usually taken every day.  Reliever or rescue medicines. These quickly relieve asthma symptoms. They are used as needed and provide short-term relief. Your health care provider will help you create an asthma action plan. An asthma action plan is a written plan for managing and treating your asthma attacks. It includes a list of your asthma triggers and how they may be avoided. It also includes information on when medicines should be taken and when their dosage should be changed. An action plan may also involve the use of a device called a peak flow meter. A peak flow meter measures how well the lungs are working. It helps you monitor your condition. HOME CARE INSTRUCTIONS   Take medicines only as directed by your health care provider. Speak with your health care provider if you have questions about how or when to take the medicines.  Use a peak flow meter as  directed by your health care provider. Record and keep track of readings.  Understand and use the action plan to help minimize or stop an asthma attack without needing to seek medical care.  Control your  home environment in the following ways to help prevent asthma attacks:  Do not smoke. Avoid being exposed to secondhand smoke.  Change your heating and air conditioning filter regularly.  Limit your use of fireplaces and wood stoves.  Get rid of pests (such as roaches and mice) and their droppings.  Throw away plants if you see mold on them.  Clean your floors and dust regularly. Use unscented cleaning products.  Try to have someone else vacuum for you regularly. Stay out of rooms while they are being vacuumed and for a short while afterward. If you vacuum, use a dust mask from a hardware store, a double-layered or microfilter vacuum cleaner bag, or a vacuum cleaner with a HEPA filter.  Replace carpet with wood, tile, or vinyl flooring. Carpet can trap dander and dust.  Use allergy-proof pillows, mattress covers, and box spring covers.  Wash bed sheets and blankets every week in hot water and dry them in a dryer.  Use blankets that are made of polyester or cotton.  Clean bathrooms and kitchens with bleach. If possible, have someone repaint the walls in these rooms with mold-resistant paint. Keep out of the rooms that are being cleaned and painted.  Wash hands frequently. SEEK MEDICAL CARE IF:   You have wheezing, shortness of breath, or a cough even if taking medicine to prevent attacks.  The colored mucus you cough up (sputum) is thicker than usual.  Your sputum changes from clear or white to yellow, green, gray, or bloody.  You have any problems that may be related to the medicines you are taking (such as a rash, itching, swelling, or trouble breathing).  You are using a reliever medicine more than 2-3 times per week.  Your peak flow is still at 50-79% of your personal best after following your action plan for 1 hour.  You have a fever. SEEK IMMEDIATE MEDICAL CARE IF:   You seem to be getting worse and are unresponsive to treatment during an asthma attack.  You are  short of breath even at rest.  You get short of breath when doing very little physical activity.  You have difficulty eating, drinking, or talking due to asthma symptoms.  You develop chest pain.  You develop a fast heartbeat.  You have a bluish color to your lips or fingernails.  You are light-headed, dizzy, or faint.  Your peak flow is less than 50% of your personal best.   This information is not intended to replace advice given to you by your health care provider. Make sure you discuss any questions you have with your health care provider.   Document Released: 11/29/2005 Document Revised: 08/20/2015 Document Reviewed: 06/28/2013 Elsevier Interactive Patient Education 2016 [employer].   Emergency Department Resource Guide 1) Find a Doctor and Pay Out of Pocket Although you won't have to find out who is covered by your insurance plan, it is a good idea to ask around and get recommendations. You will then need to call the office and see if the doctor you have chosen will accept you as a new patient and what types of options they offer for patients who are self-pay. Some doctors offer discounts or will set up payment plans for their patients who do not have insurance, but you will  need to ask so you aren't surprised when you get to your appointment.  2) Contact Your Local Health Department Not all health departments have doctors that can see patients for sick visits, but many do, so it is worth a call to see if yours does. If you don't know where your local health department is, you can check in your phone book. The CDC also has a tool to help you locate your state's health department, and many state websites also have listings of all of their local health departments.  3) Find a [facility] If your illness is not likely to be very severe or complicated, you may want to try a walk in clinic. These are popping up all over the country in pharmacies, drugstores, and shopping centers.  They're usually staffed by nurse practitioners or physician assistants that have been trained to treat common illnesses and complaints. They're usually fairly quick and inexpensive. However, if you have serious medical issues or chronic medical problems, these are probably not your best option.  No Primary Care Doctor: - Call Health Connect at  [phone] - they can help you locate a primary care doctor that  accepts your insurance, provides certain services, etc. - Physician Referral Service- [phone]  Chronic Pain Problems: Organization         Address  Phone   Notes  [facility]  [phone] Patients need to be referred by their primary care doctor.   Medication Assistance: Organization         Address  Phone   Notes  [facility] Medication [facility] [address] [place], [place] [address] [phone] --Must be a resident of [facility] -- Must have NO insurance coverage whatsoever (no Medicaid/ Medicare, etc.) -- The pt. MUST have a primary care doctor that directs their care regularly and follows them in the community   MedAssist  [phone]   [employer]  [phone]    Agencies that provide inexpensive medical care: Organization         Address  Phone   Notes  [place]  [phone]   [facility]    [phone]   [facility] [place], [place] [address] [phone]   [place] [address], [place] [phone]   Planned Parenthood    [phone]   [facility]    [phone]   [place] and [address], [place] Phone:  [phone], Fax:  [phone] Hours of Operation:  9 am - 6 pm, M-F.  Also accepts Medicaid/Medicare and self-pay.  [facility] for [place], [address], [place] Phone: [phone], Fax: [phone]. Hours of Operation:  8:30 am - 5:30 pm, M-F.  Also accepts Medicaid and self-pay.  [facility] [address], [place] Phone: [phone]   [place], [place], [place] [phone], Ext. [phone] Mondays & Thursdays: 7-9 AM.  First 15 patients are seen on a first come, first serve basis.    [place] Providers:  Organization         Address  Phone   Notes  [facility] [address], [place] [phone] Also accepts self-pay patients.  [facility] [address], [place], [place]  (  [place]) [place], [address], [place] [phone]   [place] [address], [place] [phone]   Lucianne Lei [address], [place]   [phone] Only accepts [place] Access [place] patients after they have their name applied to their card.   Self-Pay (no insurance) in [place] [facility]:  Organization         Address  Phone   Notes  Sickle Cell Patients, [facility] [place] [phone]   [facility] [place] [phone]   [facility] [place]  [place], [place], [place] [phone]   [facility]/Dr. Osei-Bonsu  [address] [place] or [address], [place] [phone] Phone number for both [place] and [place] locations is the same.  Urgent [facility] and [facility] [address], [place] [phone]   [facility] [address] [place] or [address] [phone]   [facility] [address], [place] [phone], phone; [phone], fax Sees patients 1st and 3rd Saturday of every month.  Must not qualify for public or private insurance (i.e.  Medicaid, Medicare, [place] Health Choice, Veterans' Benefits)  Household income should be no more than 200% of the poverty level The clinic cannot treat you if you are pregnant or think you are pregnant  Sexually transmitted diseases are not treated at the clinic.    Dental Care: Organization         Address  Phone  Notes  [facility] Department of [facility] [place] [phone] Accepts children up to age 42 who are enrolled in [place] or [place]; pregnant women with a Medicaid card; and children who have applied for Medicaid or [place] Health Choice, but were declined, whose parents can pay a reduced fee at time of service.  [facility]  [address], [place] [phone] Accepts children up to age 8 who are enrolled in [place] or [place]; pregnant women with a Medicaid card; and children who have applied for Medicaid or [place] Health Choice, but were declined, whose parents can pay a reduced fee at time of service.  [place] Adult Dental Access PROGRAM  [place] [phone] Patients are seen by appointment only. Walk-ins are not accepted. [place] will see patients 61 years of age and older. Monday - Tuesday (8am-5pm) Most Wednesdays (8:30-5pm) $30 per visit, cash only  [facility] Adult Dental Access PROGRAM  [address], [facility] [phone] Patients are seen by appointment only. Walk-ins are not accepted. [place] will see patients 55 years of age and older. One Wednesday Evening (Monthly: Volunteer Based).  $30 per visit, cash only  [place]  [phone] for adults; Children under age 52, call Graduate Pediatric Dentistry at [phone]. Children aged 41-14, please call [phone] to request a pediatric application.  Dental services are provided in all areas of dental care including fillings,  crowns and bridges, complete and partial dentures, implants, gum treatment, root canals, and extractions. Preventive care is also provided. Treatment is provided to both adults and children. Patients are selected via a lottery and there is  often a waiting list.   [facility] [address] [place]  [phone] [email or web address]   [facility] [address] [place], [place] [phone], Ext. [phone] Second and Fourth Thursday of each month, opens at 6:30 AM; Clinic ends at 9 AM.  Patients are seen on a first-come first-served basis, and a limited number are seen during each clinic.   [facility]  [address] Hillard Danker [place], [place] [phone]   Eligibility Requirements You must have lived in [place], [place], or [place] counties for at least the last three months.   You cannot be eligible for state or federal sponsored [employer], including [employer], [place], or [employer].   You generally cannot be eligible for healthcare insurance through your employer.    How to apply: Eligibility screenings are held every Tuesday and Wednesday afternoon from 1:00 pm until 4:00 pm. You do not need an appointment for the interview!  [facility] [address] [place], [place]   [place]  [place] Department  [place]  [phone]    Behavioral Health Resources in the Community: Intensive Outpatient Programs Organization         Address  Phone  Notes  [address] [place]. [address], [place], [place] [phone]   [facility] [address] [place], [place]   ADS: [facility] Drug [facility] [address], [place], [place]   [place] [address],  [place], [place] or [phone]   Substance Abuse  Resources Organization         Address  Phone  Notes  Alcohol and Drug Services  [phone]   [place]  [phone]   [facility] [place]   Chinita Pester  [phone]   Residential & Outpatient Substance Abuse Program  [phone]   Psychological Services Organization         Address  Phone  Notes  [facility] [place]  [place]  [phone]   [place] [address], [place] or [phone]    Mobile Crisis Teams Organization         Address  Phone  Notes  [facility], Mobile Crisis Care Unit  [phone]   [facility]  [address] [place], [place]   [place] Levels [address], [place] [phone]    Self-Help/Support Groups Organization         Address  Phone             Notes  [place]. of [place] - variety of support groups  [place] Call for more information  Narcotics Anonymous (NA), [facility] [address], [employer] [place]  2 meetings at this location   Special educational needs teacher         Address  Phone  Notes  [facility] [place],    [place]  [phone]   [facility]  [address], [place] [address], [place], [place]   [place], [place] [phone] Admissions: 8am-3pm M-F  Incentives Substance [place] [address],    [place], [place]   [facility] [address] [place], [place], [place]  [facility] [address]  [place], [place]   [facility] Programs - Intensive Outpatient [address], [place], [place]   [facility] ([place] [place].,  [place], [place] [phone] or [phone]   [facility] ([facility]) [address], [place], [place] Accepts Medicaid  [facility] [place] [address],  [place] [phone] Substance Abuse/Addiction Treatment   [facility] Organization         Address  Phone  Notes  [facility]  [phone]   Domenic Schwab, PhD [address] Arlis Porta [place], [place]   [phone] or [phone]   [place], [place] [phone]   [address], [place], [place] [phone] Insurance/Medicaid/sponsorship through [facility] [address], [address]                                    Institute, [place] [phone] [place] [address], [place] [phone]    Dr. [name]  [phone]   [facility] Dept. 1) [address], [place] 2) [place] 3)  [place] [address], [place] [phone]  [phone]   [place] [phone] or [phone] (After Hours)

## 2015-12-30 ENCOUNTER — Ambulatory Visit: Payer: BLUE CROSS/BLUE SHIELD | Admitting: Family Medicine

## 2016-01-13 ENCOUNTER — Emergency Department (HOSPITAL_COMMUNITY)
Admission: EM | Admit: 2016-01-13 | Discharge: 2016-01-13 | Disposition: A | Discharge status: Home / Self Care | Payer: BLUE CROSS/BLUE SHIELD | Attending: Emergency Medicine | Admitting: Emergency Medicine

## 2016-01-13 ENCOUNTER — Emergency Department (HOSPITAL_COMMUNITY): Payer: BLUE CROSS/BLUE SHIELD

## 2016-01-13 ENCOUNTER — Encounter (HOSPITAL_COMMUNITY): Payer: Self-pay | Admitting: Cardiology

## 2016-01-13 DIAGNOSIS — I10 Essential (primary) hypertension: Secondary | ICD-10-CM | POA: Insufficient documentation

## 2016-01-13 DIAGNOSIS — Z8659 Personal history of other mental and behavioral disorders: Secondary | ICD-10-CM | POA: Insufficient documentation

## 2016-01-13 DIAGNOSIS — Z8673 Personal history of transient ischemic attack (TIA), and cerebral infarction without residual deficits: Secondary | ICD-10-CM | POA: Insufficient documentation

## 2016-01-13 DIAGNOSIS — Z8739 Personal history of other diseases of the musculoskeletal system and connective tissue: Secondary | ICD-10-CM | POA: Diagnosis not present

## 2016-01-13 DIAGNOSIS — Z87891 Personal history of nicotine dependence: Secondary | ICD-10-CM | POA: Diagnosis not present

## 2016-01-13 DIAGNOSIS — Z8541 Personal history of malignant neoplasm of cervix uteri: Secondary | ICD-10-CM | POA: Diagnosis not present

## 2016-01-13 DIAGNOSIS — Z880 Allergy status to penicillin: Secondary | ICD-10-CM | POA: Diagnosis not present

## 2016-01-13 DIAGNOSIS — R0602 Shortness of breath: Secondary | ICD-10-CM | POA: Diagnosis present

## 2016-01-13 DIAGNOSIS — J45901 Unspecified asthma with (acute) exacerbation: Secondary | ICD-10-CM

## 2016-01-13 DIAGNOSIS — Z79899 Other long term (current) drug therapy: Secondary | ICD-10-CM | POA: Diagnosis not present

## 2016-01-13 DIAGNOSIS — Z862 Personal history of diseases of the blood and blood-forming organs and certain disorders involving the immune mechanism: Secondary | ICD-10-CM | POA: Diagnosis not present

## 2016-01-13 DIAGNOSIS — R0682 Tachypnea, not elsewhere classified: Secondary | ICD-10-CM | POA: Insufficient documentation

## 2016-01-13 LAB — CBC WITH DIFFERENTIAL/PLATELET
BASOS ABS: 0 10*3/uL (ref 0.0–0.1)
BASOS PCT: 1 %
Eosinophils Absolute: 0.4 10*3/uL (ref 0.0–0.7)
Eosinophils Relative: 5 %
HEMATOCRIT: 40.3 % (ref 36.0–46.0)
Hemoglobin: 13.1 g/dL (ref 12.0–15.0)
LYMPHS PCT: 32 %
Lymphs Abs: 2.5 10*3/uL (ref 0.7–4.0)
MCH: 29.5 pg (ref 26.0–34.0)
MCHC: 32.5 g/dL (ref 30.0–36.0)
MCV: 90.8 fL (ref 78.0–100.0)
MONOS PCT: 4 %
Monocytes Absolute: 0.3 10*3/uL (ref 0.1–1.0)
NEUTROS ABS: 4.6 10*3/uL (ref 1.7–7.7)
NEUTROS PCT: 58 %
Platelets: 349 10*3/uL (ref 150–400)
RBC: 4.44 MIL/uL (ref 3.87–5.11)
RDW: 12.8 % (ref 11.5–15.5)
WBC: 7.8 10*3/uL (ref 4.0–10.5)

## 2016-01-13 LAB — BASIC METABOLIC PANEL
ANION GAP: 12 (ref 5–15)
BUN: 14 mg/dL (ref 6–20)
CHLORIDE: 102 mmol/L (ref 101–111)
CO2: 26 mmol/L (ref 22–32)
Calcium: 9.7 mg/dL (ref 8.9–10.3)
Creatinine, Ser: 0.73 mg/dL (ref 0.44–1.00)
GFR calc non Af Amer: >60 mL/min (ref >60)
GLUCOSE: 93 mg/dL (ref 65–99)
Potassium: 3.7 mmol/L (ref 3.5–5.1)
Sodium: 140 mmol/L (ref 135–145)

## 2016-01-13 MED ORDER — PREDNISONE 20 MG PO TABS
60.0000 mg | ORAL_TABLET | Freq: Once | ORAL | Status: AC
  Administered 2016-01-13: 60 mg via ORAL
  Filled 2016-01-13: qty 3

## 2016-01-13 MED ORDER — SODIUM CHLORIDE 0.9 % IV BOLUS (SEPSIS)
1000.0000 mL | Freq: Once | INTRAVENOUS | Status: AC
Start: 2016-01-13 — End: 2016-01-13
  Administered 2016-01-13: 1000 mL via INTRAVENOUS

## 2016-01-13 MED ORDER — ALBUTEROL SULFATE HFA 108 (90 BASE) MCG/ACT IN AERS
2.0000 | INHALATION_SPRAY | Freq: Once | RESPIRATORY_TRACT | Status: AC
  Administered 2016-01-13: 2 via RESPIRATORY_TRACT
  Filled 2016-01-13: qty 6.7

## 2016-01-13 MED ORDER — IPRATROPIUM BROMIDE 0.02 % IN SOLN
1.0000 mg | Freq: Once | RESPIRATORY_TRACT | Status: AC
Start: 2016-01-13 — End: 2016-01-13
  Administered 2016-01-13: 1 mg via RESPIRATORY_TRACT
  Filled 2016-01-13: qty 5

## 2016-01-13 MED ORDER — ALBUTEROL (5 MG/ML) CONTINUOUS INHALATION SOLN
10.0000 mg/h | INHALATION_SOLUTION | RESPIRATORY_TRACT | Status: DC
  Administered 2016-01-13: 10 mg/h via RESPIRATORY_TRACT
  Filled 2016-01-13: qty 20

## 2016-01-13 MED ORDER — MAGNESIUM SULFATE 2 GM/50ML IV SOLN
2.0000 g | Freq: Once | INTRAVENOUS | Status: AC
  Administered 2016-01-13: 2 g via INTRAVENOUS
  Filled 2016-01-13: qty 50

## 2016-01-13 MED ORDER — PREDNISONE 20 MG PO TABS
60.0000 mg | ORAL_TABLET | Freq: Every day | ORAL | Status: DC
Start: 2016-01-13 — End: 2016-05-03

## 2016-01-13 NOTE — ED Notes (Signed)
Pt called out complaining of new on set chest pain. Lexine Baton, RN made aware as well as Claudine Mouton, MD. EKG completed and given to EDP.

## 2016-01-13 NOTE — Discharge Instructions (Signed)
Asthma Attack Prevention Ms. Starek, take prednisone for 4 more days to prevent your asthma from returning.  Use the albuterol inhaler every 6 hours for the next 2 days.  See a primary care doctor within 3 days for close follow up.  If symptoms worsen, come back to the ED Immediately. Thank you. While you may not be able to control the fact that you have asthma, you can take actions to prevent asthma attacks. The best way to prevent asthma attacks is to maintain good control of your asthma. You can achieve this by:  Taking your medicines as directed.  Avoiding things that can irritate your airways or make your asthma symptoms worse (asthma triggers).  Keeping track of how well your asthma is controlled and of any changes in your symptoms.  Responding quickly to worsening asthma symptoms (asthma attack).  Seeking emergency care when it is needed. WHAT ARE SOME WAYS TO PREVENT AN ASTHMA ATTACK? Have a Plan Work with your health care provider to create a written plan for managing and treating your asthma attacks (asthma action plan). This plan includes:  A list of your asthma triggers and how you can avoid them.  Information on when medicines should be taken and when their dosages should be changed.  The use of a device that measures how well your lungs are working (peak flow meter). Monitor Your Asthma Use your peak flow meter and record your results in a journal every day. A drop in your peak flow numbers on one or more days may indicate the start of an asthma attack. This can happen even before you start to feel symptoms. You can prevent an asthma attack from getting worse by following the steps in your asthma action plan. Avoid Asthma Triggers Work with your asthma health care provider to find out what your asthma triggers are. This can be done by:  Allergy testing.  Keeping a journal that notes when asthma attacks occur and the factors that may have contributed to  them.  Determining if there are other medical conditions that are making your asthma worse. Once you have determined your asthma triggers, take steps to avoid them. This may include avoiding excessive or prolonged exposure to:  Dust. Have someone dust and vacuum your home for you once or twice a week. Using a high-efficiency particulate arrestance (HEPA) vacuum is best.  Smoke. This includes campfire smoke, forest fire smoke, and secondhand smoke from tobacco products.  Pet dander. Avoid contact with animals that you know you are allergic to.  Allergens from trees, grasses or pollens. Avoid spending a lot of time outdoors when pollen counts are high, and on very windy days.  Very cold, dry, or humid air.  Mold.  Foods that contain high amounts of sulfites.  Strong odors.  Outdoor air pollutants, such as Lexicographer.  Indoor air pollutants, such as aerosol sprays and fumes from household cleaners.  Household pests, including dust mites and cockroaches, and pest droppings.  Certain medicines, including NSAIDs. Always talk to your health care provider before stopping or starting any new medicines. Medicines Take over-the-counter and prescription medicines only as told by your health care provider. Many asthma attacks can be prevented by carefully following your medicine schedule. Taking your medicines correctly is especially important when you cannot avoid certain asthma triggers. Act Quickly If an asthma attack does happen, acting quickly can decrease how severe it is and how long it lasts. Take these steps:   Pay attention to your symptoms.  If you are coughing, wheezing, or having difficulty breathing, do not wait to see if your symptoms go away on their own. Follow your asthma action plan.  If you have followed your asthma action plan and your symptoms are not improving, call your health care provider or seek immediate medical care at the nearest hospital. It is important to  note how often you need to use your fast-acting rescue inhaler. If you are using your rescue inhaler more often, it may mean that your asthma is not under control. Adjusting your asthma treatment plan may help you to prevent future asthma attacks and help you to gain better control of your condition. HOW CAN I PREVENT AN ASTHMA ATTACK WHEN I EXERCISE? Follow advice from your health care provider about whether you should use your fast-acting inhaler before exercising. Many people with asthma experience exercise-induced bronchoconstriction (EIB). This condition often worsens during vigorous exercise in cold, humid, or dry environments. Usually, people with EIB can stay very active by pre-treating with a fast-acting inhaler before exercising.   This information is not intended to replace advice given to you by your health care provider. Make sure you discuss any questions you have with your health care provider.   Document Released: 11/17/2009 Document Revised: 08/20/2015 Document Reviewed: 05/01/2015 Elsevier Interactive Patient Education [employer].

## 2016-01-13 NOTE — ED Provider Notes (Addendum)
CSN: [id number]     Arrival date & time 01/13/16  [id number] History   First MD Initiated Contact with Patient 01/13/16 1004     Chief Complaint  Patient presents with  . Asthma  . Shortness of Breath  . Cough     (Consider location/radiation/quality/duration/timing/severity/associated sxs/prior Treatment) HPI  Wendy Hoffman is a 50 y.o. female with past medical history of asthma presenting today with an exacerbation. Patient states this began 2 weeks ago but she has been treating it at home with albuterol inhaler and nebulizer. She subsequently ran out of both medicines 4 days ago. She states the cold weather normally causes her flares. Patient states this is consistent with her asthma exacerbation. She denies any history of blood clots. She has no recent long distance travel, surgeries, or use of estrogen. She has had an intermittently productive cough. She denies any recent infections or fevers. There are no further complaints.  10 Systems reviewed and are negative for acute change except as noted in the HPI.    Past Medical History  Diagnosis Date  . Asthma   . Thyroid disease   . Hypertension   . High cholesterol   . Migraine   . Morbid obesity ([place])   . H/O: hysterectomy   . Medical history non-contributory   . Hypothyroidism   . Anxiety   . Shortness of breath   . Stroke ([place])   . Arthritis   . Anemia   . Cancer [facility])     cervic   Past Surgical History  Procedure Laterality Date  . Cesarean section      3 occasions  . Thyroidectomy, partial      Goiter  . Cholecystectomy    . Abdominal hysterectomy     Family History  Problem Relation Age of Onset  . Migraines Sister    Social History  Substance Use Topics  . Smoking status: Former Research scientist (life sciences)  . Smokeless tobacco: Never Used  . Alcohol Use: No   OB History    No data available     Review of Systems    Allergies  Blueberry; Mango flavor; Aspirin; and Penicillins  Home Medications   Prior to  Admission medications   Medication Sig Start Date End Date Taking? Authorizing Provider  acetaminophen (TYLENOL) 500 MG tablet Take 500 mg by mouth every 6 (six) hours as needed for mild pain.   Yes Historical Provider, MD  albuterol (PROVENTIL HFA;VENTOLIN HFA) 108 (90 BASE) MCG/ACT inhaler Inhale 2 puffs into the lungs every 4 (four) hours as needed for wheezing or shortness of breath (wheezing). 11/24/14  Yes Serita Grit, MD  albuterol (PROVENTIL) (2.5 MG/3ML) 0.083% nebulizer solution Take 3 mLs (2.5 mg total) by nebulization every 4 (four) hours as needed for wheezing or shortness of breath. 02/09/15  Yes Courtney Forcucci, PA-C  diphenhydramine-acetaminophen (TYLENOL PM) 25-500 MG TABS tablet Take 1 tablet by mouth at bedtime as needed.   Yes Historical Provider, MD   BP 187/101 mmHg  Pulse 87  Temp(Src) 98.6 F (37 C) (Oral)  Resp 20  Ht 5\' 4"  (1.626 m)  Wt 285 lb (129.275 kg)  BMI 48.90 kg/m2  SpO2 98% Physical Exam  Constitutional: She is oriented to person, place, and time. She appears well-developed and well-nourished. No distress.  HENT:  Head: Normocephalic and atraumatic.  Nose: Nose normal.  Mouth/Throat: Oropharynx is clear and moist. No oropharyngeal exudate.  Eyes: Conjunctivae and EOM are normal. Pupils are equal, round, and reactive to light. No  scleral icterus.  Neck: Normal range of motion. Neck supple. No JVD present. No tracheal deviation present. No thyromegaly present.  Cardiovascular: Normal rate, regular rhythm and normal heart sounds.  Exam reveals no gallop and no friction rub.   No murmur heard. Pulmonary/Chest: She is in respiratory distress. She has wheezes. She exhibits no tenderness.  Tachypnea, increased worker breathing, use of accessory muscles.  Abdominal: Soft. Bowel sounds are normal. She exhibits no distension and no mass. There is no tenderness. There is no rebound and no guarding.  Musculoskeletal: Normal range of motion. She exhibits no edema  or tenderness.  Lymphadenopathy:    She has no cervical adenopathy.  Neurological: She is alert and oriented to person, place, and time. No cranial nerve deficit. She exhibits normal muscle tone.  Skin: Skin is warm and dry. No rash noted. No erythema. No pallor.  Nursing note and vitals reviewed.   ED Course  Procedures (including critical care time) Labs Review Labs Reviewed  CBC WITH DIFFERENTIAL/PLATELET  BASIC METABOLIC PANEL    Imaging Review Dg Chest 2 View  01/13/2016  CLINICAL DATA:  Cough, shortness breath, right chest pain EXAM: CHEST  2 VIEW COMPARISON:  None. FINDINGS: The heart size and mediastinal contours are within normal limits. Both lungs are clear. The visualized skeletal structures are unremarkable. IMPRESSION: No active cardiopulmonary disease. Electronically Signed   By: Kathreen Devoid   On: 01/13/2016 12:54   I have personally reviewed and evaluated these images and lab results as part of my medical decision-making.   EKG Interpretation   Date/Time:  Tuesday January 13 2016 11:49:35 EST Ventricular Rate:  110 PR Interval:  149 QRS Duration: 88 QT Interval:  313 QTC Calculation: 423 R Axis:   4 Text Interpretation:  Sinus tachycardia Posterior infarct, old Borderline  repolarization abnormality tachycardia now present Confirmed by Glynn Octave [phone]) on 01/13/2016 1:16:07 PM      MDM   Final diagnoses:  None    Patient presents emergency department for asthma exacerbation. Her history is consistent with this. She was given albuterol, ipratropium, prednisone, magnesium for treatment. Will continue to be evaluated. Also order chest x-ray for intermittently productive cough.  Chest x-ray is negative for pneumonia. Upon repeat evaluation, patient states she feels much better. She is no longer in respiratory distress, wheezes are gone, she is no longer tachypneic. Plan to discharge with albuterol inhaler, steroids for 4 days and primary care  follow-up. She denies understanding of the plan. She appears well in no acute distress, vital signs were within her normal limits and she is safe for discharge.   Everlene Balls, MD 01/13/16 1306  Everlene Balls, MD 01/13/16 1316

## 2016-01-13 NOTE — ED Notes (Signed)
Respiratory therapist at bedside.

## 2016-01-13 NOTE — ED Notes (Signed)
Pt reports she has been feeling bad since Friday and ran out of her inhaler at home. Pt with productive cough but no fever at home.

## 2016-03-02 ENCOUNTER — Ambulatory Visit: Payer: BLUE CROSS/BLUE SHIELD | Admitting: Family Medicine

## 2016-04-21 ENCOUNTER — Institutional Professional Consult (permissible substitution): Payer: BLUE CROSS/BLUE SHIELD | Admitting: Neurology

## 2016-05-03 ENCOUNTER — Ambulatory Visit (INDEPENDENT_AMBULATORY_CARE_PROVIDER_SITE_OTHER): Payer: BLUE CROSS/BLUE SHIELD | Admitting: Neurology

## 2016-05-03 ENCOUNTER — Encounter: Payer: Self-pay | Admitting: Neurology

## 2016-05-03 ENCOUNTER — Institutional Professional Consult (permissible substitution): Payer: BLUE CROSS/BLUE SHIELD | Admitting: Neurology

## 2016-05-03 VITALS — BP 160/100 | HR 80 | Resp 20 | Ht 64.5 in | Wt 290.0 lb

## 2016-05-03 DIAGNOSIS — R0683 Snoring: Secondary | ICD-10-CM | POA: Diagnosis not present

## 2016-05-03 DIAGNOSIS — G43711 Chronic migraine without aura, intractable, with status migrainosus: Secondary | ICD-10-CM | POA: Diagnosis not present

## 2016-05-03 MED ORDER — TOPIRAMATE 50 MG PO TABS: 50.0000 mg | ORAL_TABLET | Freq: Two times a day (BID) | ORAL | Status: DC

## 2016-05-03 MED ORDER — CYPROHEPTADINE HCL 4 MG PO TABS: 4.0000 mg | ORAL_TABLET | Freq: Three times a day (TID) | ORAL | Status: DC | PRN

## 2016-05-03 NOTE — Progress Notes (Signed)
SLEEP MEDICINE CLINIC   Provider:  Larey Seat, M D  Referring Provider: Shanon Rosser, PA-C Primary Care Physician:  Lorayne Marek, MD  Chief Complaint  Patient presents with  . New Patient (Initial Visit)  . Migraine    wakes up with migraines, taking topamax, rm 10, alone    HPI:  Wendy Hoffman is a 50 y.o. female , seen here as a referral  from [place]. Long for Evaluation and treatment of migraines, but she reported sleep related.   Chief complaint according to patient : The patient reports going to bed with migraines and waking up with migraines being unable to sleep them off or to  find any alleviation by sleeping.  Wendy Hoffman has been suffering from migrainous headaches for probably a decade. She reports that her headaches may sometimes last 5 days interrupted by almost a week of headache freedom until another cluster of headaches occurs. Her headaches can last throughout those 5 days. They may be waxing and waning but she's not headache free. She also describes having more than one type of headache. The worst type of headache begins at the bilateral forehead and temple creates pressure retro-orbital he, sometimes she has visual changes sometimes she loses her vision temple rarely, she gets nauseated and she has even had to vomit. She has photo-phobia and wears tinted glasses A second type of headache arises from the nape of her neck and radiates forward also creating pressure on her eyes on her temple on her forehead and has led to temporary vision loss and nosebleeds.  The third type of headache that may be more for dysesthesia is described as a feeling of having ice inside the brain melting.  Out of the last 30 days she had 21 days with headaches, he has gone to work each of these days - this  is very difficult for her. Her physician assistant has given her Percocet opiate medication for those days. She [facility] Tommi [facility] and received  trigger point injections there, which have failed to help her.   The patient has tried Imitrex, she is currently on Topamax, she's gotten Percocet prescriptions, I reviewed her medication which also includes when necessary use of ureteral, baclofen, fiber core, hydrocodone cough syrup, Imitrex 100 mg tablets prednisone 20 mg she completed a taper, promethazine also known as Phenergan for nausea, Solu-Medrol was given for injection in March, she is using Symbicort, valsartan Ventolin and verapamil.  She also has noted vertical onset Vertigo while changing posture, when  sitting down. This comes sudden- as the room is spent spinning clockwise.    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 She is chronically sleep deprived. Works in a Statistician.  She gets off work at 6 AM, reaches home at about 6:15 AM and usually tries to sleep as much as she can in daytime. She estimates that she gets about 5 hours of sleep when lucky during the day. She takes [place].  Her bedroom is cool, quiet and dark. She sleeps alone during the daytime. She prefers to sleep on her side. She uses a lot of pillows she states.She uses an alarm to wake up.   Sleep medical history and family sleep history:  Older brother and older sister has insomnia as of a couple of her nieces. Nobody has been to her knowledge diagnoses apnea. Strong maternal family trait for migraine.   Social  history: Her children are grow, 3 adults. No pets, Caffeine use- 2 cups of coffee, sodas 20 ounces 2-3 a day, Iced tea- 4-5 times a week.  ETOH - one, tobacco - smoking cigarettes 1ppm.   Review of Systems: Out of a complete 14 system review, the patient complains of only the following symptoms, and all other reviewed systems are negative. Wendy Hoffman reports migraines but there are also some other headache qualities. Some of her headaches sound more like a tension headache and some may be  related to sinusitis. She reports shortness of breath, wheezing, fatigue, headaches, insomnia, anxiety, joint pain and swelling and aching muscles. She carries a diagnosis of hypertension, migraine and anxiety. She had a thyroid ectomy, 3 C-sections a hysterectomy and gallbladder surgery fatigue severity score is endorsed at 34, Epworth sleepiness score is endorsed at 4.  depression score 2 PHQ2.    Social History   Social History  . Marital Status: Divorced    Spouse Name: N/A  . Number of Children: N/A  . Years of Education: N/A   Occupational History  . Not on file.   Social History Main Topics  . Smoking status: Former Research scientist (life sciences)  . Smokeless tobacco: Never Used  . Alcohol Use: No  . Drug Use: No  . Sexual Activity: Not Currently   Other Topics Concern  . Not on file   Social History Narrative    Family History  Problem Relation Age of Onset  . Migraines Sister     Past Medical History  Diagnosis Date  . Asthma   . Thyroid disease   . Hypertension   . High cholesterol   . Migraine   . Morbid obesity ([place])   . H/O: hysterectomy   . Medical history non-contributory   . Hypothyroidism   . Anxiety   . Shortness of breath   . Stroke ([place])   . Arthritis   . Anemia   . Cancer ([place])     cervic  . Insomnia     Past Surgical History  Procedure Laterality Date  . Cesarean section      3 occasions  . Thyroidectomy, partial      Goiter  . Cholecystectomy    . Abdominal hysterectomy      Current Outpatient Prescriptions  Medication Sig Dispense Refill  . acetaminophen (TYLENOL) 500 MG tablet Take 500 mg by mouth every 6 (six) hours as needed for mild pain.    Marland Kitchen albuterol (PROVENTIL HFA;VENTOLIN HFA) 108 (90 BASE) MCG/ACT inhaler Inhale 2 puffs into the lungs every 4 (four) hours as needed for wheezing or shortness of breath (wheezing). 1 Inhaler 0  . albuterol (PROVENTIL) (2.5 MG/3ML) 0.083% nebulizer solution Take 3 mLs (2.5 mg total) by nebulization every 4  (four) hours as needed for wheezing or shortness of breath. 30 vial 0  . baclofen (LIORESAL) 10 MG tablet Take 10 mg by mouth 4 (four) times daily as needed for muscle spasms.    . budesonide-formoterol (SYMBICORT) 160-4.5 MCG/ACT inhaler Inhale 2 puffs into the lungs 2 (two) times daily.    . diphenhydramine-acetaminophen (TYLENOL PM) 25-500 MG TABS tablet Take 1 tablet by mouth at bedtime as needed.    Marland Kitchen oxyCODONE-acetaminophen (PERCOCET) 7.5-325 MG tablet Take 1 tablet by mouth every 6 (six) hours as needed for severe pain.    Marland Kitchen topiramate (TOPAMAX) 50 MG tablet Take 50 mg by mouth daily.    . valsartan (DIOVAN) 160 MG tablet Take 160 mg by mouth daily.    Marland Kitchen  verapamil (VERELAN PM) 180 MG 24 hr capsule Take 180 mg by mouth at bedtime.    Marland Kitchen zolpidem (AMBIEN) 10 MG tablet Take 10 mg by mouth at bedtime as needed for sleep.    . SUMAtriptan (IMITREX) 100 MG tablet Take 100 mg by mouth every 2 (two) hours as needed for migraine. Reported on 05/03/2016     No current facility-administered medications for this visit.    Allergies as of 05/03/2016 - Review Complete 05/03/2016  Allergen Reaction Noted  . 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 Mango flavor Anaphylaxis 04/30/2013  . Aspirin Rash   . Penicillins Hives     Vitals: BP 160/100 mmHg  Pulse 80  Resp 20  Ht 5' 4.5" (1.638 m)  Wt 290 lb (131.543 kg)  BMI 49.03 kg/m2 Last Weight:  Wt Readings from Last 1 Encounters:  05/03/16 290 lb (131.543 kg)   [id number] mass index is 49.03 kg/(m^2).     Last Height:   Ht Readings from Last 1 Encounters:  05/03/16 5' 4.5" (1.638 m)    Physical exam:  General: The patient is awake, alert and appears not in acute distress.. Head: Normocephalic, atraumatic. Neck is supple. Mallampati 4, poor dentition. ,  neck circumference;  17 . Nasal airflow  Restricted , TMJ is not evident . Retrognathia is seen.  Cardiovascular:  Regular rate and rhythm , without  murmurs or carotid  bruit, and without distended neck veins. Respiratory: Lungs are clear to auscultation. Skin:  Without evidence of edema, or rash, tattoed  Trunk: BMI is elevated .   Neurologic exam : The patient is awake and alert, oriented to place and time.   Memory subjective described as intact.  Attention span & concentration ability appears normal.  Speech is fluent,  without dysarthria, dysphonia or aphasia.  Mood and affect are appropriate.  Cranial nerves: Pupils are equal and briskly reactive to light. Funduscopic exam without evidence of pallor or edema. Extraocular movements  in vertical and horizontal planes intact and without nystagmus. Visual fields by finger perimetry are intact.Hearing to finger rub intact. Facial sensation intact to fine touch.Facial motor strength is symmetric and tongue and uvula move midline. Shoulder shrug was symmetrical.   Motor exam:   Normal tone, muscle bulk and symmetric strength in all extremities. Sensory:  Fine touch, pinprick and vibration . Proprioception  normal. Coordination: Rapid alternating movements / Finger-to-nose maneuver  normal  Gait and station: Patient walks without assistive device and is able unassisted to climb up to the exam table. Strength within normal limits.Stance is stable and normal.   Deep tendon reflexes: in the  upper and lower extremities are symmetric and intact.   The patient was advised of the nature of the diagnosed sleep disorder , the treatment options and risks for general a health and wellness arising from not treating the condition.  I spent more than 45 minutes of face to face time with the patient. Greater than 50% of time was spent in counseling and coordination of care. We have discussed the diagnosis and differential and I answered the patient's questions.     Assessment:  After physical and neurologic examination, review of laboratory studies,  Personal review of imaging studies, reports of other /same  Imaging  studies ,  Results of polysomnography/ neurophysiology testing and pre-existing records as far as provided in visit., my assessment is   1) I believe that Wendy Hoffman either has a status migrainosus or she has a transformed daily headache  beginning is a migraine. The latter would also account for the 3 types of headaches she seems to have it is not a stereotypical every day repeat of the same headache. She does have clearly a migrainous component inform off photophobia, nausea, vascular headache. She has a tinnitus, hearing her own pulse. Risk factors aren't elevated body mass index, caffeine use, nicotine use, sleep deprivation. She is also at risk for benign intracranial hypertension, formally known as pseudotumor.  2) I will approach this from them with 2 different angles one is that I will indeed invite the patient for a sleep study with capnography. The should be attended sleep study. I would also like for her to get more hours of sleep in daytime she has definitely shift work related circadian rhythm problems and her per 24 hours sleep time is not sufficient.  I will also give Wendy Hoffman some instructions about low carb diets. A high body mass index predisposes her to obstructive sleep apnea,  Mrs. Blase reports that she has smelled normal ammonia with her migraine  Headaches- an olfactory aura?  As a preventative medication I will initiate Periactin today, Imitrex has not worked for acute headache relief and instead I would like for her to have IV Depakote if possible.  3) follow-up after sleep study in 3-4 weeks.   Plan:  Treatment plan and additional workup :  Transformed daily migraine. Morbid obesity, poor dentition, caffeine and tobacco, Insomnia, circadian rhythm.     Wendy Partridge Javel Hersh MD  05/03/2016   CC: Shanon Rosser, [place], [place] [id number]

## 2016-05-03 NOTE — Patient Instructions (Signed)

## 2016-05-12 ENCOUNTER — Telehealth: Payer: Self-pay

## 2016-05-12 DIAGNOSIS — I639 Cerebral infarction, unspecified: Principal | ICD-10-CM

## 2016-05-12 DIAGNOSIS — G43611 Persistent migraine aura with cerebral infarction, intractable, with status migrainosus: Secondary | ICD-10-CM

## 2016-05-12 MED ORDER — SUMATRIPTAN SUCCINATE 100 MG PO TABS: 100.0000 mg | ORAL_TABLET | ORAL | Status: DC | PRN

## 2016-05-12 MED FILL — SUMATRIPTAN SUCC 100 MG TAB: 100 | 26 days supply | Qty: 9 | Fill #0

## 2016-05-12 NOTE — Telephone Encounter (Signed)
We need to see the results of the sleep study as well is a headache diary. The headaches may well be related to sleep disorders, but also to overuse of medication. This is how she was instructed to keep asleep and headache diary. After she has gathered these data will look at it together.  Periactin can be a valid medication, is not addictive. I will prescribe her 30 days of supply.

## 2016-05-12 NOTE — Telephone Encounter (Signed)
I returned pt's call. No answer, left a message asking her to call me back. If pt returns my call, please skype me.

## 2016-05-12 NOTE — Telephone Encounter (Signed)
I called pt to discuss. No answer, left a message asking her to call me back. 

## 2016-05-12 NOTE — Telephone Encounter (Signed)
I spoke to pt. I asked her if she still felt like she needed to discuss sleep concerns with Wendy Hoffman tomorrow, since she was seen on 05/03/16 by Wendy Hoffman for migraines and a sleep study was ordered. Pt says she doesn't need that appt tomorrow and she would rather wait until after the sleep study to follow up.  Pt is now reporting that she has had a migraine for 3 days. She has taken oxycodone but pt reports that it doesn't help, just makes her sleepy. She denies having imitrex to take. She says that she has taken the topamax prescribed by Wendy Hoffman but it doesn't help. She wants Wendy Hoffman to know about this migraine and to get her recommendations.  I have kept the 05/13/16 appt for pt in case Wendy Hoffman feels the need to see the pt in the office tomorrow to discuss her migraine. Otherwise, I will cancel this appt. Pt verbalized understanding.

## 2016-05-12 NOTE — Telephone Encounter (Signed)
Try to reach the patient by phone to discuss the below named questions, nobody picks up. No voicemail option.CD

## 2016-05-12 NOTE — Telephone Encounter (Signed)
i will not fill opioid medications,  topiramate is a preventive and not a pain medication. There are plenty of other medications to try preventively.

## 2016-05-12 NOTE — Telephone Encounter (Signed)
Message For: [facility]                  Taken 31-MAY-17 at 11:38AM by [facility] ------------------------------------------------------------  [place]             CID  [id number]   Patient  SAME                  Pt's Dr  NOT SURE      Area Code  336  Phone#  [phone] *  DOB  07/30/2066      RE  RETURNING A CALL FROM KRISTEN//PCB                                                                      Disp:Y/N  N  If Y = C/B If No Response In 53minutes  ============================================================

## 2016-05-12 NOTE — Telephone Encounter (Signed)
Do you recommend any other medications for pt to try, other than opioids?

## 2016-05-12 NOTE — Telephone Encounter (Signed)
I called pt to advise her that her appt on 05/13/16 is not needed (per Dr. Brett Fairy). Pt was seen on 05/05/16 for migraines and Dr. Brett Fairy already ordered a sleep study.  No answer, left a message asking her to call me back.

## 2016-05-12 NOTE — Telephone Encounter (Signed)
Message For: [facility]                  Taken 31-MAY-17 at 12:10PM by BBL ------------------------------------------------------------  Wendy Hoffman             CID  [id number]   Patient  SAME                  Pt's Dr  UNSURE        Area Code  336  Phone#  [phone] *  DOB  [birth year] [name]? RETURNING CALL                                                                             Disp:Y/N  N  If Y = C/B If No Response In 53minutes

## 2016-05-12 NOTE — Telephone Encounter (Signed)
i had prescribed 30 days of periactin, but she hasn't given any feedback. Did she fill it?

## 2016-05-13 ENCOUNTER — Institutional Professional Consult (permissible substitution): Payer: BLUE CROSS/BLUE SHIELD | Admitting: Neurology

## 2016-05-13 NOTE — Telephone Encounter (Signed)
I called pt again to discuss. No answer, left a message asking her to call me back. 

## 2016-05-13 NOTE — Telephone Encounter (Signed)
I spoke with pt. She says that neither the periactin nor the topamax have helped her. She says that she has not been able to schedule her sleep study but she is keeping a headache diary. Her migraine is better today. She wants me to check on the status of her sleep study. I will check with our sleep lab. Pt verbalized understanding.

## 2016-05-14 ENCOUNTER — Encounter: Payer: Self-pay | Admitting: Neurology

## 2016-08-23 ENCOUNTER — Emergency Department (HOSPITAL_COMMUNITY): Payer: BLUE CROSS/BLUE SHIELD

## 2016-08-23 ENCOUNTER — Emergency Department (HOSPITAL_COMMUNITY)
Admission: EM | Admit: 2016-08-23 | Discharge: 2016-08-23 | Disposition: A | Discharge status: Home / Self Care | Payer: BLUE CROSS/BLUE SHIELD | Attending: Emergency Medicine | Admitting: Emergency Medicine

## 2016-08-23 ENCOUNTER — Encounter (HOSPITAL_COMMUNITY): Payer: Self-pay | Admitting: *Deleted

## 2016-08-23 DIAGNOSIS — E039 Hypothyroidism, unspecified: Secondary | ICD-10-CM | POA: Insufficient documentation

## 2016-08-23 DIAGNOSIS — Z79899 Other long term (current) drug therapy: Secondary | ICD-10-CM | POA: Insufficient documentation

## 2016-08-23 DIAGNOSIS — Z8541 Personal history of malignant neoplasm of cervix uteri: Secondary | ICD-10-CM | POA: Insufficient documentation

## 2016-08-23 DIAGNOSIS — Z87891 Personal history of nicotine dependence: Secondary | ICD-10-CM | POA: Insufficient documentation

## 2016-08-23 DIAGNOSIS — J45901 Unspecified asthma with (acute) exacerbation: Secondary | ICD-10-CM

## 2016-08-23 DIAGNOSIS — J45909 Unspecified asthma, uncomplicated: Secondary | ICD-10-CM | POA: Insufficient documentation

## 2016-08-23 DIAGNOSIS — I10 Essential (primary) hypertension: Secondary | ICD-10-CM | POA: Insufficient documentation

## 2016-08-23 MED ORDER — ALBUTEROL SULFATE (2.5 MG/3ML) 0.083% IN NEBU
5.0000 mg | INHALATION_SOLUTION | Freq: Once | RESPIRATORY_TRACT | Status: AC
  Administered 2016-08-23: 5 mg via RESPIRATORY_TRACT
  Filled 2016-08-23: qty 6

## 2016-08-23 MED ORDER — IPRATROPIUM BROMIDE 0.02 % IN SOLN
0.5000 mg | Freq: Once | RESPIRATORY_TRACT | Status: AC
  Administered 2016-08-23: 0.5 mg via RESPIRATORY_TRACT
  Filled 2016-08-23: qty 2.5

## 2016-08-23 MED ORDER — PREDNISONE 20 MG PO TABS
60.0000 mg | ORAL_TABLET | Freq: Once | ORAL | Status: AC
  Administered 2016-08-23: 60 mg via ORAL
  Filled 2016-08-23: qty 3

## 2016-08-23 MED ORDER — PREDNISONE 20 MG PO TABS: 60.0000 mg | ORAL_TABLET | Freq: Every day | ORAL | 0 refills | Status: DC

## 2016-08-23 MED ORDER — ALBUTEROL SULFATE HFA 108 (90 BASE) MCG/ACT IN AERS
2.0000 | INHALATION_SPRAY | RESPIRATORY_TRACT | Status: DC | PRN
  Administered 2016-08-23: 2 via RESPIRATORY_TRACT
  Filled 2016-08-23: qty 6.7

## 2016-08-23 MED ORDER — VERAPAMIL HCL ER 180 MG PO CP24: 180.0000 mg | ORAL_CAPSULE | Freq: Every day | ORAL | 0 refills | Status: DC

## 2016-08-23 NOTE — ED Triage Notes (Signed)
Pt with hx of asthma complains of shortness of breath and wheezing since this morning. Pt has used inhaler with no relief.

## 2016-08-23 NOTE — ED Provider Notes (Signed)
[facility] DEPT Provider Note   CSN: [id number] Arrival date & time: 08/23/16  1805  By signing my name below, I, Irene Pap, attest that this documentation has been prepared under the direction and in the presence of [employer], PA-C. Electronically Signed: Irene Pap, ED Scribe. 08/23/16. 7:41 PM.  History   Chief Complaint Chief Complaint  Patient presents with  . Asthma   The history is provided by the patient. No language interpreter was used.    HPI Comments: Wendy Hoffman is a 50 y.o. Female with a hx of cervical cancer, HTN, SOB, stroke, and asthma who presents to the Emergency Department complaining of gradually worsening SOB onset one day ago. She reports associated wheezing and cough. Pt has used her inhaler to no relief. Pt states that these symptoms are typical of an asthma flare up for her. She has run out of her albuterol medication at home. Pt received a breathing treatment in the ED to mild relief. Pt denies fever, chills, chest pain, congestion, or sore throat.   Past Medical History:  Diagnosis Date  . Anemia   . Anxiety   . Arthritis   . Asthma   . Cancer ([place])    cervic  . H/O: hysterectomy   . High cholesterol   . Hypertension   . Hypothyroidism   . Insomnia   . Medical history non-contributory   . Migraine   . Morbid obesity ([place])   . Shortness of breath   . Stroke ([place])   . Thyroid disease     Patient Active Problem List   Diagnosis Date Noted  . Snoring 05/03/2016  . Morbid obesity ([place]) 04/25/2014  . Asthma exacerbation 04/22/2014  . Asthma attack 04/22/2014  . Asthma with acute exacerbation 11/03/2013  . Unspecified hypothyroidism 11/03/2013  . Tobacco abuse 11/03/2013  . Migraine 09/22/2012  . Weakness of right side of body 09/22/2012  . Thyroid disease   . Hypertension   . High cholesterol   . H/O: hysterectomy   . ASTHMA 11/26/2008  . HEADACHE, CHRONIC 11/26/2008    Past Surgical History:  Procedure  Laterality Date  . ABDOMINAL HYSTERECTOMY    . CESAREAN SECTION     3 occasions  . CHOLECYSTECTOMY    . THYROIDECTOMY, PARTIAL     Goiter    OB History    No data available      Home Medications    Prior to Admission medications   Medication Sig Start Date End Date Taking? Authorizing Provider  acetaminophen (TYLENOL) 500 MG tablet Take 500 mg by mouth every 6 (six) hours as needed for mild pain.    Historical Provider, MD  albuterol (PROVENTIL HFA;VENTOLIN HFA) 108 (90 BASE) MCG/ACT inhaler Inhale 2 puffs into the lungs every 4 (four) hours as needed for wheezing or shortness of breath (wheezing). 11/24/14   Serita Grit, MD  albuterol (PROVENTIL) (2.5 MG/3ML) 0.083% nebulizer solution Take 3 mLs (2.5 mg total) by nebulization every 4 (four) hours as needed for wheezing or shortness of breath. 02/09/15   Courtney Forcucci, PA-C  baclofen (LIORESAL) 10 MG tablet Take 10 mg by mouth 4 (four) times daily as needed for muscle spasms.    Historical Provider, MD  budesonide-formoterol (SYMBICORT) 160-4.5 MCG/ACT inhaler Inhale 2 puffs into the lungs 2 (two) times daily.    Historical Provider, MD  cyproheptadine (PERIACTIN) 4 MG tablet Take 1 tablet (4 mg total) by mouth 3 (three) times daily as needed for allergies. 05/03/16   Carmen Dohmeier,  MD  diphenhydramine-acetaminophen (TYLENOL PM) 25-500 MG TABS tablet Take 1 tablet by mouth at bedtime as needed.    Historical Provider, MD  SUMAtriptan (IMITREX) 100 MG tablet Take 1 tablet (100 mg total) by mouth every 2 (two) hours as needed for migraine. Reported on 05/03/2016 05/12/16   Larey Seat, MD  topiramate (TOPAMAX) 50 MG tablet Take 1 tablet (50 mg total) by mouth 2 (two) times daily. 05/03/16   Asencion Partridge Dohmeier, MD  valsartan (DIOVAN) 160 MG tablet Take 160 mg by mouth daily.    Historical Provider, MD  verapamil (VERELAN PM) 180 MG 24 hr capsule Take 180 mg by mouth at bedtime.    Historical Provider, MD  zolpidem (AMBIEN) 10 MG tablet  Take 10 mg by mouth at bedtime as needed for sleep.    Historical Provider, MD    Family History Family History  Problem Relation Age of Onset  . Migraines Sister     Social History Social History  Substance Use Topics  . Smoking status: Former Research scientist (life sciences)  . Smokeless tobacco: Never Used  . Alcohol use No     Allergies   Blueberry [vaccinium angustifolium]; Mango flavor; Aspirin; and Penicillins   Review of Systems Review of Systems  Constitutional: Negative for chills and fever.  HENT: Negative for congestion and sore throat.   Respiratory: Positive for cough, shortness of breath and wheezing.   A complete 10 system review of systems was obtained and all systems are negative except as noted in the HPI and PMH.   Physical Exam Updated Vital Signs BP (!) 194/113 (BP Location: Left Arm)   Pulse 81   Temp 97.8 F (36.6 C) (Oral)   Resp 18   SpO2 100%   Physical Exam  Constitutional: She is oriented to person, place, and time. She appears well-developed and well-nourished.  HENT:  Head: Normocephalic and atraumatic.  Eyes: EOM are normal. Pupils are equal, round, and reactive to light.  Neck: Normal range of motion. Neck supple.  Cardiovascular: Normal rate, regular rhythm and normal heart sounds.   Pulmonary/Chest: Effort normal. She has decreased breath sounds. She has wheezes.  Breath sounds decreased with diffuse wheezing  Abdominal: Soft. There is no tenderness.  Musculoskeletal: Normal range of motion.  Neurological: She is alert and oriented to person, place, and time.  Skin: Skin is warm and dry.  Psychiatric: She has a normal mood and affect. Her behavior is normal.  Nursing note and vitals reviewed.  ED Treatments / Results  DIAGNOSTIC STUDIES: Oxygen Saturation is 100% on RA, normal by my interpretation.    COORDINATION OF CARE: 7:40 PM-Discussed treatment plan which includes chest x-ray and breathing treatment with pt at bedside and pt agreed to plan.    8:30 PM- recheck; lungs clear to auscultation after breathing treatment. Pt states that she feels much better. BP is elevated; pt states that she is out of her BP medication, which will be prescribed upon discharge. Safe for discharge.   Labs (all labs ordered are listed, but only abnormal results are displayed) Labs Reviewed - No data to display  EKG  EKG Interpretation None       Radiology Dg Chest 2 View  Result Date: 08/23/2016 CLINICAL DATA:  50 year old female with shortness of breath, cough and wheezing for 1 day. EXAM: CHEST  2 VIEW COMPARISON:  Chest x-ray 01/13/2016. FINDINGS: Lung volumes are normal. No consolidative airspace disease. No pleural effusions. No pneumothorax. No pulmonary nodule or mass noted. Pulmonary vasculature and  the cardiomediastinal silhouette are within normal limits. IMPRESSION: No radiographic evidence of acute cardiopulmonary disease. Electronically Signed   By: Vinnie Langton M.D.   On: 08/23/2016 18:41    Procedures Procedures (including critical care time)  Medications Ordered in ED Medications  albuterol (PROVENTIL) (2.5 MG/3ML) 0.083% nebulizer solution 5 mg (not administered)  ipratropium (ATROVENT) nebulizer solution 0.5 mg (not administered)     Initial Impression / Assessment and Plan / ED Course  I have reviewed the triage vital signs and the nursing notes.  Pertinent labs & imaging results that were available during my care of the patient were reviewed by me and considered in my medical decision making (see chart for details).  Clinical Course     Final Clinical Impressions(s) / ED Diagnoses   Pt CXR negative for acute infiltrate. Patients symptoms are consistent with an asthma flare up. Discussed that antibiotics are not indicated for viral infections. Pt will be discharged with symptomatic treatment and course of Prednisone.  Verbalizes understanding and is agreeable with plan. Pt is hemodynamically stable & in NAD prior  to dc.  Return precautions given.    Final diagnoses:  None  I personally performed the services described in this documentation, which was scribed in my presence. The recorded information has been reviewed and is accurate.   New Prescriptions New Prescriptions   No medications on file     Hyman Bible, PA-C 08/25/16 2148    Alfonzo Beers, MD 08/27/16 [phone]

## 2017-03-15 ENCOUNTER — Ambulatory Visit (HOSPITAL_COMMUNITY)
Admission: EM | Admit: 2017-03-15 | Discharge: 2017-03-15 | Disposition: A | Discharge status: Home / Self Care | Payer: No Typology Code available for payment source | Attending: Family Medicine | Admitting: Family Medicine

## 2017-03-15 ENCOUNTER — Encounter (HOSPITAL_COMMUNITY): Payer: Self-pay | Admitting: *Deleted

## 2017-03-15 DIAGNOSIS — G43C1 Periodic headache syndromes in child or adult, intractable: Secondary | ICD-10-CM

## 2017-03-15 LAB — POCT I-STAT, CHEM 8
BUN: 11 mg/dL (ref 6–20)
CALCIUM ION: 1.13 mmol/L — AB (ref 1.15–1.40)
Chloride: 103 mmol/L (ref 101–111)
Creatinine, Ser: 0.8 mg/dL (ref 0.44–1.00)
GLUCOSE: 91 mg/dL (ref 65–99)
HCT: 36 % (ref 36.0–46.0)
HEMOGLOBIN: 12.2 g/dL (ref 12.0–15.0)
POTASSIUM: 4 mmol/L (ref 3.5–5.1)
SODIUM: 139 mmol/L (ref 135–145)
TCO2: 27 mmol/L (ref 0–100)

## 2017-03-15 MED ORDER — DEXAMETHASONE SODIUM PHOSPHATE 10 MG/ML IJ SOLN
INTRAMUSCULAR | Status: AC
  Filled 2017-03-15: qty 1

## 2017-03-15 MED ORDER — DIPHENHYDRAMINE HCL 50 MG/ML IJ SOLN
12.5000 mg | Freq: Once | INTRAMUSCULAR | Status: AC
  Administered 2017-03-15: 12.5 mg via INTRAMUSCULAR

## 2017-03-15 MED ORDER — DIPHENHYDRAMINE HCL 50 MG/ML IJ SOLN
INTRAMUSCULAR | Status: AC
  Filled 2017-03-15: qty 1

## 2017-03-15 MED ORDER — METOCLOPRAMIDE HCL 5 MG/ML IJ SOLN
10.0000 mg | Freq: Once | INTRAMUSCULAR | Status: AC
  Administered 2017-03-15: 10 mg via INTRAMUSCULAR

## 2017-03-15 MED ORDER — KETOROLAC TROMETHAMINE 30 MG/ML IJ SOLN
INTRAMUSCULAR | Status: AC
  Filled 2017-03-15: qty 1

## 2017-03-15 MED ORDER — ALBUTEROL SULFATE HFA 108 (90 BASE) MCG/ACT IN AERS: 2.0000 | INHALATION_SPRAY | RESPIRATORY_TRACT | 1 refills | Status: DC | PRN

## 2017-03-15 MED ORDER — METOCLOPRAMIDE HCL 5 MG/ML IJ SOLN: 10.0000 mg | Freq: Once | INTRAMUSCULAR | Status: DC

## 2017-03-15 MED ORDER — METOCLOPRAMIDE HCL 5 MG/ML IJ SOLN
INTRAMUSCULAR | Status: AC
  Filled 2017-03-15: qty 2

## 2017-03-15 MED ORDER — KETOROLAC TROMETHAMINE 30 MG/ML IJ SOLN
30.0000 mg | Freq: Once | INTRAMUSCULAR | Status: AC
  Administered 2017-03-15: 30 mg via INTRAMUSCULAR

## 2017-03-15 MED ORDER — DEXAMETHASONE SODIUM PHOSPHATE 4 MG/ML IJ SOLN
4.0000 mg | Freq: Once | INTRAMUSCULAR | Status: AC
  Administered 2017-03-15: 4 mg via INTRAMUSCULAR

## 2017-03-15 NOTE — ED Provider Notes (Signed)
CSN: [id number]     Arrival date & time 03/15/17  1749 History   First MD Initiated Contact with Patient 03/15/17 1908     Chief Complaint  Patient presents with  . Migraine   (Consider location/radiation/quality/duration/timing/severity/associated sxs/prior Treatment) HPI Patient comes in today with migraine since last Friday. She has a well-documented history for treatment of migraines in the past. Currently complaining of photophobia, nausea, vomiting and pain in the right side of her head that extends into the right trapezius right shoulder. Denies any history of previous neck pain. No points of upper or lower extremity numbness.  Patient says that she has not been taking her migraine medication over the last 8 months due to her losing her insurance when she changed her job.  No complaints of  fever chills.  Today she's been using ibuprofen without any relief. She has a history of asthma and has had some cough that is nonproductive. Denies chest pain shortness of breath. Past Medical History:  Diagnosis Date  . Anemia   . Anxiety   . Arthritis   . Asthma   . Cancer ([place])    cervic  . H/O: hysterectomy   . High cholesterol   . Hypertension   . Hypothyroidism   . Insomnia   . Medical history non-contributory   . Migraine   . Morbid obesity ([place])   . Shortness of breath   . Stroke ([place])   . Thyroid disease    Past Surgical History:  Procedure Laterality Date  . ABDOMINAL HYSTERECTOMY    . CESAREAN SECTION     3 occasions  . CHOLECYSTECTOMY    . THYROIDECTOMY, PARTIAL     Goiter   Family History  Problem Relation Age of Onset  . Migraines Sister    Social History  Substance Use Topics  . Smoking status: Former Research scientist (life sciences)  . Smokeless tobacco: Never Used  . Alcohol use No   OB History    No data available     Review of Systems  Constitutional: Positive for activity change. Negative for chills and fever.  HENT: Positive for nosebleeds (Earlier today lasted about 5  minutes). Negative for congestion, hearing loss, postnasal drip, rhinorrhea, sinus pain and sore throat.   Respiratory: Positive for cough. Negative for chest tightness, shortness of breath and wheezing.   Cardiovascular: Negative.   Gastrointestinal: Negative.   Musculoskeletal: Positive for neck pain.  Neurological: Positive for headaches. Negative for dizziness, seizures, speech difficulty, weakness and numbness.  Psychiatric/Behavioral: Negative.     Allergies  Blueberry [vaccinium angustifolium]; Mango flavor; Aspirin; and Penicillins  Home Medications   Prior to Admission medications   Medication Sig Start Date End Date Taking? Authorizing Provider  acetaminophen (TYLENOL) 500 MG tablet Take 500 mg by mouth every 6 (six) hours as needed for mild pain.    Historical Provider, MD  albuterol (PROVENTIL HFA;VENTOLIN HFA) 108 (90 Base) MCG/ACT inhaler Inhale 2 puffs into the lungs every 4 (four) hours as needed for wheezing or shortness of breath (wheezing). 03/15/17   Lanae Crumbly, PA-C  albuterol (PROVENTIL) (2.5 MG/3ML) 0.083% nebulizer solution Take 3 mLs (2.5 mg total) by nebulization every 4 (four) hours as needed for wheezing or shortness of breath. 02/09/15   Courtney Forcucci, PA-C  baclofen (LIORESAL) 10 MG tablet Take 10 mg by mouth 4 (four) times daily as needed for muscle spasms.    Historical Provider, MD  budesonide-formoterol (SYMBICORT) 160-4.5 MCG/ACT inhaler Inhale 2 puffs into the lungs 2 (two)  times daily.    Historical Provider, MD  cyproheptadine (PERIACTIN) 4 MG tablet Take 1 tablet (4 mg total) by mouth 3 (three) times daily as needed for allergies. 05/03/16   Asencion Partridge Dohmeier, MD  diphenhydramine-acetaminophen (TYLENOL PM) 25-500 MG TABS tablet Take 1 tablet by mouth at bedtime as needed.    Historical Provider, MD  predniSONE (DELTASONE) 20 MG tablet Take 3 tablets (60 mg total) by mouth daily. 08/23/16   Heather Laisure, PA-C  SUMAtriptan (IMITREX) 100 MG tablet Take 1  tablet (100 mg total) by mouth every 2 (two) hours as needed for migraine. Reported on 05/03/2016 05/12/16   Larey Seat, MD  topiramate (TOPAMAX) 50 MG tablet Take 1 tablet (50 mg total) by mouth 2 (two) times daily. 05/03/16   Asencion Partridge Dohmeier, MD  valsartan (DIOVAN) 160 MG tablet Take 160 mg by mouth daily.    Historical Provider, MD  verapamil (VERELAN PM) 180 MG 24 hr capsule Take 1 capsule (180 mg total) by mouth at bedtime. 08/23/16   Heather Laisure, PA-C  zolpidem (AMBIEN) 10 MG tablet Take 10 mg by mouth at bedtime as needed for sleep.    Historical Provider, MD   Meds Ordered and Administered this Visit   Medications  dexamethasone (DECADRON) injection 4 mg (4 mg Intramuscular Given 03/15/17 1946)  diphenhydrAMINE (BENADRYL) injection 12.5 mg (12.5 mg Intramuscular Given 03/15/17 1940)  ketorolac (TORADOL) 30 MG/ML injection 30 mg (30 mg Intramuscular Given 03/15/17 1944)  metoCLOPramide (REGLAN) injection 10 mg (10 mg Intramuscular Given 03/15/17 1958)    BP (!) 156/77 (BP Location: Right Arm) Comment: notified rn  Pulse 90   Temp 98.4 F (36.9 C) (Oral)   Resp 16   SpO2 100%  No data found.   Physical Exam  Constitutional: She is oriented to person, place, and time. She appears well-developed. She appears distressed.  HENT:  Head: Normocephalic.  Nose: Nose normal.  Eyes: EOM are normal. Pupils are equal, round, and reactive to light.  Neck:  Decrease cervical spine range of motion with lateral bending and turning to the right. Right greater than left brachial plexus and trapezius tenderness.  Cardiovascular: Normal rate.   Pulmonary/Chest: Breath sounds normal. No respiratory distress.  Musculoskeletal: Normal range of motion.  Neurovascular intact. No focal motor deficits upper or lower extremity.  Lymphadenopathy:    She has no cervical adenopathy.  Neurological: She is alert and oriented to person, place, and time.  Skin: Skin is warm and dry. She is not diaphoretic.   Psychiatric: She has a normal mood and affect.    Urgent Care Course     Procedures (including critical care time)  Labs Review Labs Reviewed  POCT I-STAT, CHEM 8 - Abnormal; Notable for the following:       Result Value   Calcium, Ion 1.13 (*)    All other components within normal limits    Imaging Review No results found.   Visual Acuity Review  Right Eye Distance:   Left Eye Distance:   Bilateral Distance:    Right Eye Near:   Left Eye Near:    Bilateral Near:         MDM   1. Intractable periodic headache syndrome   Patient given a migraine cocktail consisting of the medications above. Patient also requested an albuterol inhaler. She has a history of asthma. Recommend that she contact [facility] to get established with a primary care physician. If she has not had  any improvement in her migraine symptoms over the next couple of days she should go immediately to the emergency room for further evaluation. Discussed treatment plan my attending.     Lanae Crumbly, PA-C 03/15/17 2011

## 2017-03-15 NOTE — Discharge Instructions (Signed)
If symptoms are not getting better over the next couple of days you should go immediately to the emergency room for evaluation.

## 2017-03-15 NOTE — ED Triage Notes (Signed)
4   Days  Of  A  Headache   And  Asthma  Flare  Up  Yesterday  Ran  Out of  Albuterol  Pump  Last    Dry   Cough

## 2017-03-21 ENCOUNTER — Telehealth: Payer: Self-pay

## 2017-03-21 NOTE — Telephone Encounter (Signed)
LVM to schedule pt for sleep study on 06/01/16, 06/17/16, and  06/24/16. No return call back

## 2017-04-10 ENCOUNTER — Encounter (HOSPITAL_COMMUNITY): Payer: Self-pay

## 2017-04-10 ENCOUNTER — Emergency Department (HOSPITAL_COMMUNITY): Payer: No Typology Code available for payment source

## 2017-04-10 ENCOUNTER — Emergency Department (HOSPITAL_COMMUNITY)
Admission: EM | Admit: 2017-04-10 | Discharge: 2017-04-10 | Disposition: A | Discharge status: Home / Self Care | Payer: No Typology Code available for payment source | Attending: Emergency Medicine | Admitting: Emergency Medicine

## 2017-04-10 DIAGNOSIS — Z87891 Personal history of nicotine dependence: Secondary | ICD-10-CM | POA: Insufficient documentation

## 2017-04-10 DIAGNOSIS — Z79899 Other long term (current) drug therapy: Secondary | ICD-10-CM | POA: Diagnosis not present

## 2017-04-10 DIAGNOSIS — R0602 Shortness of breath: Secondary | ICD-10-CM | POA: Diagnosis present

## 2017-04-10 DIAGNOSIS — Z8673 Personal history of transient ischemic attack (TIA), and cerebral infarction without residual deficits: Secondary | ICD-10-CM | POA: Diagnosis not present

## 2017-04-10 DIAGNOSIS — I10 Essential (primary) hypertension: Secondary | ICD-10-CM | POA: Insufficient documentation

## 2017-04-10 DIAGNOSIS — E039 Hypothyroidism, unspecified: Secondary | ICD-10-CM | POA: Insufficient documentation

## 2017-04-10 DIAGNOSIS — J4521 Mild intermittent asthma with (acute) exacerbation: Secondary | ICD-10-CM | POA: Insufficient documentation

## 2017-04-10 DIAGNOSIS — Z8541 Personal history of malignant neoplasm of cervix uteri: Secondary | ICD-10-CM | POA: Diagnosis not present

## 2017-04-10 MED ORDER — IPRATROPIUM BROMIDE 0.02 % IN SOLN
0.5000 mg | Freq: Once | RESPIRATORY_TRACT | Status: AC
  Administered 2017-04-10: 0.5 mg via RESPIRATORY_TRACT
  Filled 2017-04-10: qty 2.5

## 2017-04-10 MED ORDER — ALBUTEROL SULFATE (2.5 MG/3ML) 0.083% IN NEBU
5.0000 mg | INHALATION_SOLUTION | Freq: Once | RESPIRATORY_TRACT | Status: AC
  Administered 2017-04-10: 5 mg via RESPIRATORY_TRACT
  Filled 2017-04-10: qty 6

## 2017-04-10 MED ORDER — ALBUTEROL (5 MG/ML) CONTINUOUS INHALATION SOLN
10.0000 mg/h | INHALATION_SOLUTION | RESPIRATORY_TRACT | Status: DC
  Administered 2017-04-10: 10 mg/h via RESPIRATORY_TRACT
  Filled 2017-04-10: qty 20

## 2017-04-10 MED ORDER — BENZONATATE 100 MG PO CAPS: 200.0000 mg | ORAL_CAPSULE | Freq: Two times a day (BID) | ORAL | 0 refills | Status: DC | PRN

## 2017-04-10 MED ORDER — METHYLPREDNISOLONE SODIUM SUCC 125 MG IJ SOLR
125.0000 mg | Freq: Once | INTRAMUSCULAR | Status: AC
  Administered 2017-04-10: 125 mg via INTRAVENOUS
  Filled 2017-04-10: qty 2

## 2017-04-10 MED ORDER — PREDNISONE 20 MG PO TABS: 40.0000 mg | ORAL_TABLET | Freq: Every day | ORAL | 0 refills | Status: DC

## 2017-04-10 NOTE — Discharge Instructions (Signed)
Continue taking your home medications as prescribed. Continue using your home albuterol inhaler and neb treatments as needed for shortness of breath/wheezing. Take your prescription prednisone as prescribed until completed. Follow-up with your primary care provider within the next 3-4 days for follow-up evaluation. Please return to the Emergency Department if symptoms worsen or new onset of fever, chest pain, difficulty breathing, coughing up blood, abdominal pain, vomiting, leg swelling.

## 2017-04-10 NOTE — ED Triage Notes (Signed)
Pt with asthma exacerbation and cough.  Wheezing.  Home treatments without improvement.

## 2017-04-10 NOTE — ED Provider Notes (Signed)
[facility] DEPT Provider Note   CSN: [id number] Arrival date & time: 04/10/17  1621     History   Chief Complaint Chief Complaint  Patient presents with  . Asthma    HPI Wendy Hoffman is a 51 y.o. female.  HPI   Patient is a 51 year old female with history of asthma, hypertension, obesity, hyperparathyroidism who presents to the ED with complaint of asthma exacerbation. Patient reports having worsening asthma over the past week that significantly worsened this afternoon. Reports using her home neb treatments with minimal intermittent relief. Endorses associated dry cough, wheezing, SOB, chest tightness. Denies fever, chills, nasal congestion, rhinorrhea, sore throat, hemoptysis, CP, abdominal pain, vomiting, leg swelling. Denies hx of DVT/PE, recent hospitalization/immobilization, hx of cancer, hormone use, recent surgery.   Past Medical History:  Diagnosis Date  . Anemia   . Anxiety   . Arthritis   . Asthma   . Cancer ([place])    cervic  . H/O: hysterectomy   . High cholesterol   . Hypertension   . Hypothyroidism   . Insomnia   . Medical history non-contributory   . Migraine   . Morbid obesity ([place])   . Shortness of breath   . Stroke ([place])   . Thyroid disease     Patient Active Problem List   Diagnosis Date Noted  . Snoring 05/03/2016  . Morbid obesity ([place]) 04/25/2014  . Asthma exacerbation 04/22/2014  . Asthma attack 04/22/2014  . Asthma with acute exacerbation 11/03/2013  . Unspecified hypothyroidism 11/03/2013  . Tobacco abuse 11/03/2013  . Migraine 09/22/2012  . Weakness of right side of body 09/22/2012  . Thyroid disease   . Hypertension   . High cholesterol   . H/O: hysterectomy   . ASTHMA 11/26/2008  . HEADACHE, CHRONIC 11/26/2008    Past Surgical History:  Procedure Laterality Date  . ABDOMINAL HYSTERECTOMY    . CESAREAN SECTION     3 occasions  . CHOLECYSTECTOMY    . THYROIDECTOMY, PARTIAL     Goiter    OB History    No data  available       Home Medications    Prior to Admission medications   Medication Sig Start Date End Date Taking? Authorizing Provider  albuterol (PROVENTIL HFA;VENTOLIN HFA) 108 (90 Base) MCG/ACT inhaler Inhale 2 puffs into the lungs every 4 (four) hours as needed for wheezing or shortness of breath (wheezing). 03/15/17  Yes Lanae Crumbly, PA-C  albuterol (PROVENTIL) (2.5 MG/3ML) 0.083% nebulizer solution Take 3 mLs (2.5 mg total) by nebulization every 4 (four) hours as needed for wheezing or shortness of breath. 02/09/15  Yes Courtney Forcucci, PA-C  baclofen (LIORESAL) 10 MG tablet Take 10 mg by mouth 4 (four) times daily as needed for muscle spasms.   Yes Historical Provider, MD  budesonide-formoterol (SYMBICORT) 160-4.5 MCG/ACT inhaler Inhale 2 puffs into the lungs 2 (two) times daily.   Yes Historical Provider, MD  cyproheptadine (PERIACTIN) 4 MG tablet Take 1 tablet (4 mg total) by mouth 3 (three) times daily as needed for allergies. 05/03/16  Yes Carmen Dohmeier, MD  ibuprofen (ADVIL,MOTRIN) 200 MG tablet Take 800 mg by mouth every 6 (six) hours as needed.   Yes Historical Provider, MD  Ibuprofen-Diphenhydramine HCl (ADVIL PM) 200-25 MG CAPS Take 1-2 tablets by mouth at bedtime as needed (sleep).   Yes Historical Provider, MD  SUMAtriptan (IMITREX) 100 MG tablet Take 1 tablet (100 mg total) by mouth every 2 (two) hours as needed for migraine. Reported  on 05/03/2016 05/12/16  Yes Carmen Dohmeier, MD  topiramate (TOPAMAX) 50 MG tablet Take 1 tablet (50 mg total) by mouth 2 (two) times daily. 05/03/16  Yes Carmen Dohmeier, MD  valsartan (DIOVAN) 160 MG tablet Take 160 mg by mouth daily.   Yes Historical Provider, MD  verapamil (VERELAN PM) 180 MG 24 hr capsule Take 1 capsule (180 mg total) by mouth at bedtime. 08/23/16  Yes Heather Laisure, PA-C  zolpidem (AMBIEN) 10 MG tablet Take 10 mg by mouth at bedtime as needed for sleep.   Yes Historical Provider, MD  benzonatate (TESSALON) 100 MG capsule Take  2 capsules (200 mg total) by mouth 2 (two) times daily as needed for cough. 04/10/17   Nona Dell, PA-C  predniSONE (DELTASONE) 20 MG tablet Take 2 tablets (40 mg total) by mouth daily. 04/10/17   Nona Dell, PA-C    Family History Family History  Problem Relation Age of Onset  . Migraines Sister     Social History Social History  Substance Use Topics  . Smoking status: Former Research scientist (life sciences)  . Smokeless tobacco: Never Used  . Alcohol use No     Allergies   Blueberry [vaccinium angustifolium]; Mango flavor; Aspirin; and Penicillins   Review of Systems Review of Systems  Respiratory: Positive for cough, chest tightness, shortness of breath and wheezing.   All other systems reviewed and are negative.    Physical Exam Updated Vital Signs BP 125/76 (BP Location: Right Arm)   Pulse (!) 103   Temp 98.4 F (36.9 C) (Oral)   Resp 18   Ht 5\' 4"  (1.626 m)   Wt 131.5 kg   SpO2 100%   BMI 49.78 kg/m   Physical Exam  Constitutional: She is oriented to person, place, and time. She appears well-developed and well-nourished. No distress.  Morbidly obese  HENT:  Head: Normocephalic and atraumatic.  Mouth/Throat: Oropharynx is clear and moist. No oropharyngeal exudate.  Eyes: Conjunctivae and EOM are normal. Right eye exhibits no discharge. Left eye exhibits no discharge. No scleral icterus.  Neck: Normal range of motion. Neck supple.  Cardiovascular: Regular rhythm, normal heart sounds and intact distal pulses.   Tachycardic, HR 110  Pulmonary/Chest: Effort normal. No respiratory distress. She has wheezes. She has no rales. She exhibits no tenderness.  Diffuse expiratory wheezing throughout with good air movement bilaterally.  Abdominal: Soft. Bowel sounds are normal. She exhibits no distension and no mass. There is no tenderness. There is no rebound and no guarding. No hernia.  Musculoskeletal: Normal range of motion. She exhibits no edema.  Neurological: She  is alert and oriented to person, place, and time.  Skin: Skin is warm and dry. She is not diaphoretic.  Nursing note and vitals reviewed.    ED Treatments / Results  Labs (all labs ordered are listed, but only abnormal results are displayed) Labs Reviewed - No data to display  EKG  EKG Interpretation None       Radiology Dg Chest 2 View  Result Date: 04/10/2017 CLINICAL DATA:  Increased shortness of breath for 1 week EXAM: CHEST  2 VIEW COMPARISON:  08/23/2016 FINDINGS: The heart size and mediastinal contours are within normal limits. Both lungs are clear. The visualized skeletal structures are unremarkable. IMPRESSION: No active cardiopulmonary disease. Electronically Signed   By: Donavan Foil M.D.   On: 04/10/2017 18:15    Procedures Procedures (including critical care time)  Medications Ordered in ED Medications  albuterol (PROVENTIL,VENTOLIN) solution continuous neb (0  mg/hr Nebulization Stopped 04/10/17 1702)  ipratropium (ATROVENT) nebulizer solution 0.5 mg (0.5 mg Nebulization Given 04/10/17 1658)  methylPREDNISolone sodium succinate (SOLU-MEDROL) 125 mg/2 mL injection 125 mg (125 mg Intravenous Given 04/10/17 1747)  albuterol (PROVENTIL) (2.5 MG/3ML) 0.083% nebulizer solution 5 mg (5 mg Nebulization Given 04/10/17 1904)     Initial Impression / Assessment and Plan / ED Course  I have reviewed the triage vital signs and the nursing notes.  Pertinent labs & imaging results that were available during my care of the patient were reviewed by me and considered in my medical decision making (see chart for details).    Pt presents with asthma exacerbation, worsening over the past week, minimal relief with neb txs at home. Denies fever, CP. Initial vitals showed mild tachycardia, HR 110, remaining vitals stable. O2 sat 100% on RA. Exam revealed diffuse expiratory wheezing throughout with good air movement, no signs of respiratory distress. Pt given steroids and neb tx. CXR  negative. On reevaluation pt reports improvement of sxs. Reexaminations/p neb tx, improvement of wheezing throughout but pt continues to have mild expiratory wheezing, plan to give another neb tx. Repeat vitals HR 98, O2 sat 100% on RA. Reexamination showed resolution of wheezing. Pt reports resolution of sxs and states she is ready for d/c. Presentation consistent with asthma exacerbation. Do not suspect PE as pt denies any CP or other risk factors and HR improved s/p neb txs with resolution of wheezing. Plan to d/c pt home with steroids, antitussive and advised to continue using home neb tx as needed. Advised to follow up with PCP in 2-3 days. Discussed strict return precautions.  Final Clinical Impressions(s) / ED Diagnoses   Final diagnoses:  Mild intermittent asthma with exacerbation    New Prescriptions New Prescriptions   BENZONATATE (TESSALON) 100 MG CAPSULE    Take 2 capsules (200 mg total) by mouth 2 (two) times daily as needed for cough.   PREDNISONE (DELTASONE) 20 MG TABLET    Take 2 tablets (40 mg total) by mouth daily.     Chesley Noon [place], [place] 04/10/17 [place], MD 04/10/17 2039

## 2017-04-27 ENCOUNTER — Ambulatory Visit (HOSPITAL_COMMUNITY)
Admission: EM | Admit: 2017-04-27 | Discharge: 2017-04-27 | Disposition: A | Discharge status: Home / Self Care | Payer: No Typology Code available for payment source | Attending: Family Medicine | Admitting: Family Medicine

## 2017-04-27 ENCOUNTER — Encounter (HOSPITAL_COMMUNITY): Payer: Self-pay | Admitting: Emergency Medicine

## 2017-04-27 DIAGNOSIS — H5712 Ocular pain, left eye: Secondary | ICD-10-CM | POA: Diagnosis not present

## 2017-04-27 DIAGNOSIS — H00024 Hordeolum internum left upper eyelid: Secondary | ICD-10-CM | POA: Diagnosis not present

## 2017-04-27 MED ORDER — ERYTHROMYCIN 5 MG/GM OP OINT: TOPICAL_OINTMENT | OPHTHALMIC | 0 refills | Status: DC

## 2017-04-27 NOTE — ED Triage Notes (Signed)
Pt reports she woke up w/left eye swelling this am  Reports she went to sleep last night it it was slightly swollen  Sx also include pain  Denies inj/trauma  A&O x4... NAD... Ambulatory

## 2017-04-27 NOTE — ED Provider Notes (Signed)
CSN: [id number]     Arrival date & time 04/27/17  1002 History   First MD Initiated Contact with Patient 04/27/17 1029     Chief Complaint  Patient presents with  . Eye Problem   (Consider location/radiation/quality/duration/timing/severity/associated sxs/prior Treatment) 51 year old female presents with left eyelid swelling that started last evening. Has gotten much worse this morning. Upper eyelid is so swollen she is not able to open her eye. Slightly painful with clear to white discharge present. Also itchy. Denies any trauma. No change in her vision when she forces her eyelid open to see. Has tried hot compresses to area with minimal relief. No other chronic health issues except asthma and insomnia. Uses Albuterol inhaler prn. No history of eye disorders.    The history is provided by the patient.    Past Medical History:  Diagnosis Date  . Anemia   . Anxiety   . Arthritis   . Asthma   . Cancer ([place])    cervic  . H/O: hysterectomy   . High cholesterol   . Hypertension   . Hypothyroidism   . Insomnia   . Medical history non-contributory   . Migraine   . Morbid obesity ([place])   . Shortness of breath   . Stroke ([place])   . Thyroid disease    Past Surgical History:  Procedure Laterality Date  . ABDOMINAL HYSTERECTOMY    . CESAREAN SECTION     3 occasions  . CHOLECYSTECTOMY    . THYROIDECTOMY, PARTIAL     Goiter   Family History  Problem Relation Age of Onset  . Migraines Sister    Social History  Substance Use Topics  . Smoking status: Light Tobacco Smoker    Types: Cigarettes  . Smokeless tobacco: Never Used  . Alcohol use No   OB History    No data available     Review of Systems  Constitutional: Negative for appetite change, chills, fatigue and fever.  HENT: Positive for facial swelling (left upper eyelid). Negative for congestion, ear pain, postnasal drip, rhinorrhea, sinus pain, sinus pressure, sneezing and sore throat.   Eyes: Positive for pain,  discharge and itching. Negative for photophobia, redness and visual disturbance.  Respiratory: Negative for cough, chest tightness, shortness of breath and wheezing.   Cardiovascular: Negative for chest pain.  Gastrointestinal: Negative for abdominal pain, diarrhea, nausea and vomiting.  Musculoskeletal: Negative for arthralgias, joint swelling, myalgias, neck pain and neck stiffness.  Skin: Negative for rash and wound.  Neurological: Negative for dizziness, syncope, weakness, light-headedness, numbness and headaches.  Hematological: Negative for adenopathy.    Allergies  Blueberry [vaccinium angustifolium]; Mango flavor; Aspirin; and Penicillins  Home Medications   Prior to Admission medications   Medication Sig Start Date End Date Taking? Authorizing Provider  albuterol (PROVENTIL HFA;VENTOLIN HFA) 108 (90 Base) MCG/ACT inhaler Inhale 2 puffs into the lungs every 4 (four) hours as needed for wheezing or shortness of breath (wheezing). 03/15/17  Yes Lanae Crumbly, PA-C  albuterol (PROVENTIL) (2.5 MG/3ML) 0.083% nebulizer solution Take 3 mLs (2.5 mg total) by nebulization every 4 (four) hours as needed for wheezing or shortness of breath. 02/09/15  Yes Forcucci, Courtney, PA-C  budesonide-formoterol (SYMBICORT) 160-4.5 MCG/ACT inhaler Inhale 2 puffs into the lungs 2 (two) times daily.    [provider]  erythromycin ophthalmic ointment Place a 1/2 inch ribbon of ointment into the lower eyelid 4 times a day as directed 04/27/17   Katy Apo, NP  Ibuprofen-Diphenhydramine  HCl (ADVIL PM) 200-25 MG CAPS Take 1-2 tablets by mouth at bedtime as needed (sleep).    [provider]   Meds Ordered and Administered this Visit  Medications - No data to display  BP 139/88 (BP Location: Left Arm)   Pulse 97   Temp 98.3 F (36.8 C) (Oral)   Resp 20   SpO2 98%  No data found.   Physical Exam  Constitutional: She is oriented to person, place, and time. She appears  well-developed and well-nourished. No distress.  HENT:  Head: Normocephalic and atraumatic.  Right Ear: Hearing, tympanic membrane, external ear and ear canal normal.  Left Ear: Hearing, tympanic membrane, external ear and ear canal normal.  Nose: Nose normal.  Mouth/Throat: Uvula is midline, oropharynx is clear and moist and mucous membranes are normal.  Eyes: Conjunctivae and EOM are normal. Pupils are equal, round, and reactive to light. Right eye exhibits no discharge, no exudate and no hordeolum. No foreign body present in the right eye. Left eye exhibits discharge, exudate and hordeolum. No foreign body present in the left eye. Right conjunctiva is not injected. Left conjunctiva is not injected.    Left upper eyelid very swollen over lower lid. Slightly red from mid eyelid to lateral aspect. Tender over mid upper eyelid. 3 to 41mm stye present on left upper inner eyelid with white discharge present. Conjunctiva is normal. Normal eye movement.   Neck: Normal range of motion. Neck supple.  Cardiovascular: Normal rate, regular rhythm and normal heart sounds.   Pulmonary/Chest: Effort normal and breath sounds normal. No respiratory distress.  Musculoskeletal: Normal range of motion.  Lymphadenopathy:    She has no cervical adenopathy.  Neurological: She is alert and oriented to person, place, and time.  Skin: Skin is warm.  Psychiatric: She has a normal mood and affect. Her behavior is normal. Judgment and thought content normal.    Urgent Care Course     Procedures (including critical care time)  Labs Review Labs Reviewed - No data to display  Imaging Review No results found.   Visual Acuity Review  Right Eye Distance:   Left Eye Distance:   Bilateral Distance:    Right Eye Near:   Left Eye Near:    Bilateral Near:         MDM   1. Hordeolum internum of left upper eyelid   2. Left eye pain    Discussed with patient that she has a stye present on her upper eyelid  that is causing the swelling. Recommend apply Erythromycin ointment 4 times a day to affected eye for 5 to 7 days. Continue to use warm compresses on eye 3 to 4 times a day for comfort and to help increase drainage. May take Ibuprofen 600mg  every 6 hours as needed for pain. May need oral antibiotics if swelling and stye do not improve. Note written for work. Follow-up with an Eye doctor (information provided) within 24 to 48 hours if not improving or sooner if symptoms worsen.      Katy Apo, NP 04/27/17 1556

## 2017-04-27 NOTE — Discharge Instructions (Addendum)
Recommend apply Erythromycin ointment 4 times a day to affected eye for 5 to 7 days. Continue to use warm compresses on eye 3 to 4 times a day for comfort and to help increase drainage. May take Ibuprofen 600mg  every 6 hours as needed for pain. Follow-up with an Eye doctor (listed) within 24 to 48 hours if not improving or sooner if symptoms worsen.

## 2017-08-31 ENCOUNTER — Encounter (HOSPITAL_BASED_OUTPATIENT_CLINIC_OR_DEPARTMENT_OTHER): Payer: Self-pay | Admitting: Emergency Medicine

## 2017-08-31 ENCOUNTER — Emergency Department (HOSPITAL_BASED_OUTPATIENT_CLINIC_OR_DEPARTMENT_OTHER)
Admission: EM | Admit: 2017-08-31 | Discharge: 2017-08-31 | Disposition: A | Discharge status: Home / Self Care | Payer: No Typology Code available for payment source | Attending: Emergency Medicine | Admitting: Emergency Medicine

## 2017-08-31 DIAGNOSIS — Z8673 Personal history of transient ischemic attack (TIA), and cerebral infarction without residual deficits: Secondary | ICD-10-CM | POA: Insufficient documentation

## 2017-08-31 DIAGNOSIS — I10 Essential (primary) hypertension: Secondary | ICD-10-CM | POA: Insufficient documentation

## 2017-08-31 DIAGNOSIS — E7800 Pure hypercholesterolemia, unspecified: Secondary | ICD-10-CM | POA: Insufficient documentation

## 2017-08-31 DIAGNOSIS — Z87891 Personal history of nicotine dependence: Secondary | ICD-10-CM | POA: Insufficient documentation

## 2017-08-31 DIAGNOSIS — J4541 Moderate persistent asthma with (acute) exacerbation: Secondary | ICD-10-CM

## 2017-08-31 DIAGNOSIS — E039 Hypothyroidism, unspecified: Secondary | ICD-10-CM | POA: Insufficient documentation

## 2017-08-31 MED ORDER — ALBUTEROL SULFATE (2.5 MG/3ML) 0.083% IN NEBU
INHALATION_SOLUTION | RESPIRATORY_TRACT | Status: AC
  Administered 2017-08-31: 2.5 mg
  Filled 2017-08-31: qty 3

## 2017-08-31 MED ORDER — ALBUTEROL SULFATE HFA 108 (90 BASE) MCG/ACT IN AERS
2.0000 | INHALATION_SPRAY | Freq: Once | RESPIRATORY_TRACT | Status: AC
  Administered 2017-08-31: 2 via RESPIRATORY_TRACT
  Filled 2017-08-31: qty 6.7

## 2017-08-31 MED ORDER — IPRATROPIUM-ALBUTEROL 0.5-2.5 (3) MG/3ML IN SOLN
3.0000 mL | Freq: Once | RESPIRATORY_TRACT | Status: AC
  Administered 2017-08-31: 3 mL via RESPIRATORY_TRACT
  Filled 2017-08-31: qty 3

## 2017-08-31 MED ORDER — PREDNISONE 50 MG PO TABS
50.0000 mg | ORAL_TABLET | Freq: Once | ORAL | Status: AC
  Administered 2017-08-31: 50 mg via ORAL
  Filled 2017-08-31: qty 1

## 2017-08-31 MED ORDER — PREDNISONE 50 MG PO TABS: 50.0000 mg | ORAL_TABLET | Freq: Every day | ORAL | 0 refills | Status: DC

## 2017-08-31 MED ORDER — IPRATROPIUM BROMIDE 0.02 % IN SOLN
RESPIRATORY_TRACT | Status: AC
  Administered 2017-08-31: 0.5 mg
  Filled 2017-08-31: qty 2.5

## 2017-08-31 MED ORDER — ALBUTEROL SULFATE HFA 108 (90 BASE) MCG/ACT IN AERS: 2.0000 | INHALATION_SPRAY | Freq: Four times a day (QID) | RESPIRATORY_TRACT | 11 refills | Status: DC | PRN

## 2017-08-31 MED ORDER — IPRATROPIUM-ALBUTEROL 0.5-2.5 (3) MG/3ML IN SOLN
RESPIRATORY_TRACT | Status: AC
  Filled 2017-08-31: qty 3

## 2017-08-31 NOTE — ED Notes (Signed)
RT evaluated patient.

## 2017-08-31 NOTE — ED Triage Notes (Signed)
Patient reports asthma attack since yesterday.  Reports doing breathing treatments at home without relief.

## 2017-08-31 NOTE — ED Provider Notes (Signed)
[facility] DEPT [facility] Provider Note   CSN: [id number] Arrival date & time: 08/31/17  1519  History   Chief Complaint Chief Complaint  Patient presents with  . Asthma    HPI Wendy Hoffman is a 51 y.o. female here with asthma exacerbation. Started several days ago, she has been out of her inhaler. She has no health insurance and could not get refills. Has nebulizer, has been using this about 4 times a day and sometimes waking up in the middle of the night to use it. Flare due to weather changes. Denies recent illness. Has felt some improvement here with 2 breathing treatments. Endorses wheezing, SOB. No fever or chills.   HPI  Past Medical History:  Diagnosis Date  . Anemia   . Anxiety   . Arthritis   . Asthma   . Cancer ([place])    cervic  . H/O: hysterectomy   . High cholesterol   . Hypertension   . Hypothyroidism   . Insomnia   . Medical history non-contributory   . Migraine   . Morbid obesity ([place])   . Shortness of breath   . Stroke ([place])   . Thyroid disease     Patient Active Problem List   Diagnosis Date Noted  . Snoring 05/03/2016  . Morbid obesity ([place]) 04/25/2014  . Asthma exacerbation 04/22/2014  . Asthma attack 04/22/2014  . Asthma with acute exacerbation 11/03/2013  . Unspecified hypothyroidism 11/03/2013  . Tobacco abuse 11/03/2013  . Migraine 09/22/2012  . Weakness of right side of body 09/22/2012  . Thyroid disease   . Hypertension   . High cholesterol   . H/O: hysterectomy   . ASTHMA 11/26/2008  . HEADACHE, CHRONIC 11/26/2008    Past Surgical History:  Procedure Laterality Date  . ABDOMINAL HYSTERECTOMY    . CESAREAN SECTION     3 occasions  . CHOLECYSTECTOMY    . THYROIDECTOMY, PARTIAL     Goiter    OB History    No data available       Home Medications    Prior to Admission medications   Medication Sig Start Date End Date Taking? Authorizing Provider  albuterol (PROVENTIL HFA;VENTOLIN HFA) 108 (90 Base) MCG/ACT inhaler  Inhale 2 puffs into the lungs every 4 (four) hours as needed for wheezing or shortness of breath (wheezing). 03/15/17   Lanae Crumbly, PA-C  albuterol (PROVENTIL HFA;VENTOLIN HFA) 108 (90 Base) MCG/ACT inhaler Inhale 2 puffs into the lungs every 6 (six) hours as needed for wheezing or shortness of breath. 08/31/17   Lucila Maine C, DO  albuterol (PROVENTIL) (2.5 MG/3ML) 0.083% nebulizer solution Take 3 mLs (2.5 mg total) by nebulization every 4 (four) hours as needed for wheezing or shortness of breath. 02/09/15   Forcucci, Courtney, PA-C  budesonide-formoterol (SYMBICORT) 160-4.5 MCG/ACT inhaler Inhale 2 puffs into the lungs 2 (two) times daily.    [provider]  erythromycin ophthalmic ointment Place a 1/2 inch ribbon of ointment into the lower eyelid 4 times a day as directed 04/27/17   Katy Apo, NP  Ibuprofen-Diphenhydramine HCl (ADVIL PM) 200-25 MG CAPS Take 1-2 tablets by mouth at bedtime as needed (sleep).    [provider]  predniSONE (DELTASONE) 50 MG tablet Take 1 tablet (50 mg total) by mouth daily with breakfast. 08/31/17   Steve Rattler, DO    Family History Family History  Problem Relation Age of Onset  . Migraines Sister     Social History Social History  Substance Use Topics  . Smoking status: Former Smoker    Types: Cigarettes  . Smokeless tobacco: Never Used  . Alcohol use No     Allergies   Blueberry [vaccinium angustifolium]; Mango flavor; Aspirin; and Penicillins   Review of Systems Review of Systems  Constitutional: Negative for activity change, appetite change, chills and fever.  HENT: Negative for congestion, rhinorrhea, sinus pain and sore throat.   Eyes: Negative for pain and discharge.  Respiratory: Positive for chest tightness, shortness of breath and wheezing.   Cardiovascular: Negative for chest pain and palpitations.  Gastrointestinal: Negative for abdominal pain.  Genitourinary: Negative for dysuria.  Musculoskeletal:  Negative for myalgias.  Skin: Negative for rash.  Neurological: Negative for dizziness, tremors, weakness and headaches.  Psychiatric/Behavioral: Negative for behavioral problems. The patient is not nervous/anxious.      Physical Exam Updated Vital Signs BP (!) 144/96 (BP Location: Left Arm)   Pulse 92   Temp 97.9 F (36.6 C) (Oral)   Resp (!) 22   Ht 5\' 4"  (1.626 m)   Wt 129.3 kg (285 lb)   SpO2 100%   BMI 48.92 kg/m   Physical Exam  Constitutional: She is oriented to person, place, and time. She appears well-developed and well-nourished. No distress.  HENT:  Head: Normocephalic and atraumatic.  Right Ear: External ear normal.  Left Ear: External ear normal.  Mouth/Throat: Oropharynx is clear and moist.  Eyes: Pupils are equal, round, and reactive to light. EOM are normal.  Neck: Normal range of motion. Neck supple.  Cardiovascular: Normal rate and regular rhythm.   No murmur heard. Pulmonary/Chest: Effort normal. No respiratory distress. She has wheezes.  Abdominal: Soft. She exhibits no distension. There is no tenderness.  Musculoskeletal: Normal range of motion.  Lymphadenopathy:    She has no cervical adenopathy.  Neurological: She is alert and oriented to person, place, and time. She exhibits normal muscle tone.  Skin: Skin is warm and dry. No rash noted.  Psychiatric: She has a normal mood and affect. Her behavior is normal. Judgment and thought content normal.     ED Treatments / Results  Labs (all labs ordered are listed, but only abnormal results are displayed) Labs Reviewed - No data to display  EKG  EKG Interpretation None       Radiology No results found.  Procedures Procedures (including critical care time)  Medications Ordered in ED Medications  albuterol (PROVENTIL) (2.5 MG/3ML) 0.083% nebulizer solution (2.5 mg  Given 08/31/17 1527)  albuterol (PROVENTIL) (2.5 MG/3ML) 0.083% nebulizer solution (2.5 mg  Given 08/31/17 1533)  ipratropium  (ATROVENT) 0.02 % nebulizer solution (0.5 mg  Given 08/31/17 1533)  ipratropium-albuterol (DUONEB) 0.5-2.5 (3) MG/3ML nebulizer solution 3 mL (3 mLs Nebulization Given 08/31/17 1612)  predniSONE (DELTASONE) tablet 50 mg (50 mg Oral Given 08/31/17 1635)  albuterol (PROVENTIL HFA;VENTOLIN HFA) 108 (90 Base) MCG/ACT inhaler 2 puff (2 puffs Inhalation Given 08/31/17 1635)     Initial Impression / Assessment and Plan / ED Course  I have reviewed the triage vital signs and the nursing notes.  Pertinent labs & imaging results that were available during my care of the patient were reviewed by me and considered in my medical decision making (see chart for details).     Well appearing 51 year old female presenting with asthma exacerbation for past 4 days. Afebrile, in no respiratory distress. Lung exam clear after 3 breathing treatments. Given albuterol inhaler in ED and first dose of prednisone 50mg . Will  complete 5 day burst as outpatient. Stable for discharge home. Return precautions discussed for worsening respiratory status. Patient verbalized understanding and agreement with plan.  Final Clinical Impressions(s) / ED Diagnoses   Final diagnoses:  Moderate persistent asthma with exacerbation    New Prescriptions Discharge Medication List as of 08/31/2017  4:35 PM    START taking these medications   Details  !! albuterol (PROVENTIL HFA;VENTOLIN HFA) 108 (90 Base) MCG/ACT inhaler Inhale 2 puffs into the lungs every 6 (six) hours as needed for wheezing or shortness of breath., Starting Wed 08/31/2017, Print    predniSONE (DELTASONE) 50 MG tablet Take 1 tablet (50 mg total) by mouth daily with breakfast., Starting Wed 08/31/2017, Print     !! - Potential duplicate medications found. Please discuss with provider.     Lucila Maine, DO PGY-2, [facility] 08/31/2017 4:55 PM    Steve Rattler, DO 08/31/17 1655    Tanna Furry, MD 09/12/17 2211

## 2017-10-18 ENCOUNTER — Encounter (HOSPITAL_BASED_OUTPATIENT_CLINIC_OR_DEPARTMENT_OTHER): Payer: Self-pay | Admitting: Emergency Medicine

## 2017-10-18 ENCOUNTER — Emergency Department (HOSPITAL_BASED_OUTPATIENT_CLINIC_OR_DEPARTMENT_OTHER)
Admission: EM | Admit: 2017-10-18 | Discharge: 2017-10-18 | Disposition: A | Discharge status: Home / Self Care | Payer: No Typology Code available for payment source | Attending: Emergency Medicine | Admitting: Emergency Medicine

## 2017-10-18 ENCOUNTER — Other Ambulatory Visit: Payer: Self-pay

## 2017-10-18 ENCOUNTER — Emergency Department (HOSPITAL_BASED_OUTPATIENT_CLINIC_OR_DEPARTMENT_OTHER): Payer: No Typology Code available for payment source

## 2017-10-18 DIAGNOSIS — D649 Anemia, unspecified: Secondary | ICD-10-CM | POA: Insufficient documentation

## 2017-10-18 DIAGNOSIS — R05 Cough: Secondary | ICD-10-CM | POA: Diagnosis not present

## 2017-10-18 DIAGNOSIS — R0602 Shortness of breath: Secondary | ICD-10-CM | POA: Diagnosis present

## 2017-10-18 DIAGNOSIS — I10 Essential (primary) hypertension: Secondary | ICD-10-CM | POA: Diagnosis not present

## 2017-10-18 DIAGNOSIS — R062 Wheezing: Secondary | ICD-10-CM | POA: Insufficient documentation

## 2017-10-18 DIAGNOSIS — Z87891 Personal history of nicotine dependence: Secondary | ICD-10-CM | POA: Diagnosis not present

## 2017-10-18 DIAGNOSIS — J4541 Moderate persistent asthma with (acute) exacerbation: Secondary | ICD-10-CM | POA: Diagnosis not present

## 2017-10-18 DIAGNOSIS — E039 Hypothyroidism, unspecified: Secondary | ICD-10-CM | POA: Insufficient documentation

## 2017-10-18 MED ORDER — ALBUTEROL SULFATE HFA 108 (90 BASE) MCG/ACT IN AERS: 2.0000 | INHALATION_SPRAY | RESPIRATORY_TRACT | 1 refills | Status: DC | PRN

## 2017-10-18 MED ORDER — PREDNISONE 20 MG PO TABS: 40.0000 mg | ORAL_TABLET | Freq: Every day | ORAL | 0 refills | Status: AC

## 2017-10-18 MED ORDER — ALBUTEROL SULFATE HFA 108 (90 BASE) MCG/ACT IN AERS
INHALATION_SPRAY | RESPIRATORY_TRACT | Status: AC
  Administered 2017-10-18: 2 via RESPIRATORY_TRACT
  Filled 2017-10-18: qty 6.7

## 2017-10-18 MED ORDER — IPRATROPIUM-ALBUTEROL 0.5-2.5 (3) MG/3ML IN SOLN
RESPIRATORY_TRACT | Status: AC
  Administered 2017-10-18: 3 mL via RESPIRATORY_TRACT
  Filled 2017-10-18: qty 3

## 2017-10-18 MED ORDER — ALBUTEROL SULFATE (2.5 MG/3ML) 0.083% IN NEBU
INHALATION_SOLUTION | RESPIRATORY_TRACT | Status: AC
  Administered 2017-10-18: 2.5 mg via RESPIRATORY_TRACT
  Filled 2017-10-18: qty 3

## 2017-10-18 MED ORDER — ALBUTEROL SULFATE (2.5 MG/3ML) 0.083% IN NEBU
2.5000 mg | INHALATION_SOLUTION | Freq: Once | RESPIRATORY_TRACT | Status: AC
  Administered 2017-10-18: 2.5 mg via RESPIRATORY_TRACT

## 2017-10-18 MED ORDER — ALBUTEROL SULFATE (2.5 MG/3ML) 0.083% IN NEBU: 2.5000 mg | INHALATION_SOLUTION | RESPIRATORY_TRACT | 0 refills | Status: DC | PRN

## 2017-10-18 MED ORDER — ALBUTEROL SULFATE HFA 108 (90 BASE) MCG/ACT IN AERS
2.0000 | INHALATION_SPRAY | Freq: Once | RESPIRATORY_TRACT | Status: AC
  Administered 2017-10-18: 2 via RESPIRATORY_TRACT

## 2017-10-18 MED ORDER — IPRATROPIUM-ALBUTEROL 0.5-2.5 (3) MG/3ML IN SOLN
3.0000 mL | Freq: Once | RESPIRATORY_TRACT | Status: AC
  Administered 2017-10-18: 3 mL via RESPIRATORY_TRACT

## 2017-10-18 MED FILL — predniSONE 20 MG TABS: 20 | 5 days supply | Qty: 10 | Fill #0

## 2017-10-18 MED FILL — ALBUTEROL 0.083% INHAL SOLN: (2.5 MG/3ML | 13 days supply | Qty: 75 | Fill #0

## 2017-10-18 NOTE — ED Provider Notes (Signed)
Emergency Department Provider Note   I have reviewed the triage vital signs and the nursing notes.   HISTORY  Chief Complaint Shortness of Breath   HPI Wendy Hoffman is a 51 y.o. female with PMH of HTN, HLD, and asthma presents to the emergency department for evaluation of asthma exacerbation.  Patient reports cough and some shortness of breath worsening since yesterday.  She ran out of her albuterol inhaler and nebulizer medications today and experienced worsening shortness of breath.  She denies any URI symptoms, fevers, chills, productive cough.  Has had some sinus congestion the last several weeks to months but states that is normal for her.  No chest pain without coughing.  No tightness in the chest.   Past Medical History:  Diagnosis Date  . Anemia   . Anxiety   . Arthritis   . Asthma   . Cancer ([facility])    cervic  . H/O: hysterectomy   . High cholesterol   . Hypertension   . Hypothyroidism   . Insomnia   . Medical history non-contributory   . Migraine   . Morbid obesity ([place])   . Shortness of breath   . Stroke ([place])   . Thyroid disease     Patient Active Problem List   Diagnosis Date Noted  . Snoring 05/03/2016  . Morbid obesity ([place]) 04/25/2014  . Asthma exacerbation 04/22/2014  . Asthma attack 04/22/2014  . Asthma with acute exacerbation 11/03/2013  . Unspecified hypothyroidism 11/03/2013  . Tobacco abuse 11/03/2013  . Migraine 09/22/2012  . Weakness of right side of body 09/22/2012  . Thyroid disease   . Hypertension   . High cholesterol   . H/O: hysterectomy   . ASTHMA 11/26/2008  . HEADACHE, CHRONIC 11/26/2008    Past Surgical History:  Procedure Laterality Date  . ABDOMINAL HYSTERECTOMY    . CESAREAN SECTION     3 occasions  . CHOLECYSTECTOMY    . THYROIDECTOMY, PARTIAL     Goiter    Current Outpatient Rx  . Order #: [id number] Class: Print  . Order #: [id number] Class: Print  . Order #: [id number] Class: Historical Med  . Order #:  [id number] Class: Normal  . Order #: [id number] Class: Historical Med  . Order #: [id number] Class: Print    Allergies Blueberry [vaccinium angustifolium]; Mango flavor; Aspirin; and Penicillins  Family History  Problem Relation Age of Onset  . Migraines Sister     Social History Social History   Tobacco Use  . Smoking status: Former Smoker    Types: Cigarettes  . Smokeless tobacco: Never Used  Substance Use Topics  . Alcohol use: No  . Drug use: No    Review of Systems  Constitutional: No fever/chills Eyes: No visual changes. ENT: No sore throat. Cardiovascular: Denies chest pain. Respiratory: Positive shortness of breath and cough.  Gastrointestinal: No abdominal pain.  No nausea, no vomiting.  No diarrhea.  No constipation. Genitourinary: Negative for dysuria. Musculoskeletal: Negative for back pain. Skin: Negative for rash. Neurological: Negative for headaches, focal weakness or numbness.  10-point ROS otherwise negative.  ____________________________________________   PHYSICAL EXAM:  VITAL SIGNS: ED Triage Vitals  Enc Vitals Group     BP 10/18/17 1549 (!) 144/87     Pulse Rate 10/18/17 1549 (!) 107     Resp 10/18/17 1549 (!) 24     Temp 10/18/17 1550 98.7 F (37.1 C)     Temp Source 10/18/17 1549 Oral     SpO2 10/18/17 1549 99 %  Weight 10/18/17 1549 281 lb (127.5 kg)     Height 10/18/17 1549 5\' 4"  (1.626 m)     Pain Score 10/18/17 1549 0   Constitutional: Alert and oriented. Well appearing and in no acute distress. Eyes: Conjunctivae are normal.  Head: Atraumatic. Nose: No congestion/rhinnorhea. Mouth/Throat: Mucous membranes are moist.  Neck: No stridor.   Cardiovascular: Normal rate, regular rhythm. Good peripheral circulation. Grossly normal heart sounds.   Respiratory: Normal respiratory effort.  No retractions. Lungs with mild end-expiratory wheezing. Gastrointestinal: Soft and nontender. No distention.  Musculoskeletal: No lower extremity  tenderness nor edema. No gross deformities of extremities. Neurologic:  Normal speech and language. No gross focal neurologic deficits are appreciated.  Skin:  Skin is warm, dry and intact. No rash noted.  ____________________________________________  RADIOLOGY  None ____________________________________________   PROCEDURES  Procedure(s) performed:   Procedures  None ____________________________________________   INITIAL IMPRESSION / ASSESSMENT AND PLAN / ED COURSE  Pertinent labs & imaging results that were available during my care of the patient were reviewed by me and considered in my medical decision making (see chart for details).  With asthma exacerbation in the setting of running out of her albuterol inhaler and nebulizer.  She has a symmetrical exam with normal oxygen saturation.  She does have some faint and expiratory wheezing but is feeling much better after nebulizer treatment given in triage.  EKG reviewed with no acute abnormalities.  No indication for labs or chest x-ray at this time.  Plan for steroid burst and refill of albuterol medications.  Will refer to primary care physician.  At this time, I do not feel there is any life-threatening condition present. I have reviewed and discussed all results (EKG, imaging, lab, urine as appropriate), exam findings with patient. I have reviewed nursing notes and appropriate previous records.  I feel the patient is safe to be discharged home without further emergent workup. Discussed usual and customary return precautions. Patient and family (if present) verbalize understanding and are comfortable with this plan.  Patient will follow-up with their primary care provider. If they do not have a primary care provider, information for follow-up has been provided to them. All questions have been answered.  ____________________________________________  FINAL CLINICAL IMPRESSION(S) / ED DIAGNOSES  Final diagnoses:  Moderate  persistent asthma with exacerbation     MEDICATIONS GIVEN DURING THIS VISIT:  Medications  ipratropium-albuterol (DUONEB) 0.5-2.5 (3) MG/3ML nebulizer solution 3 mL (3 mLs Nebulization Given 10/18/17 1556)  albuterol (PROVENTIL) (2.5 MG/3ML) 0.083% nebulizer solution 2.5 mg (2.5 mg Nebulization Given 10/18/17 1555)  albuterol (PROVENTIL HFA;VENTOLIN HFA) 108 (90 Base) MCG/ACT inhaler 2 puff (2 puffs Inhalation Given 10/18/17 1621)     NEW OUTPATIENT MEDICATIONS STARTED DURING THIS VISIT:  Albuterol inh Prednisone 40 mg x 5 days  Note:  This document was prepared using Dragon voice recognition software and may include unintentional dictation errors.  Nanda Quinton, MD Emergency Medicine    Tami Blass, [facility], MD 10/19/17 [id number]

## 2017-10-18 NOTE — Discharge Instructions (Signed)
We believe that your symptoms are caused today by an exacerbation of your asthma.  Please take the prescribed medications and any medications that you have at home.  Follow up with your doctor as recommended.  If you develop any new or worsening symptoms, including but not limited to fever, persistent vomiting, worsening shortness of breath, or other symptoms that concern you, please return to the Emergency Department immediately. ° ° °Asthma °Asthma is a recurring condition in which the airways tighten and narrow. Asthma can make it difficult to breathe. It can cause coughing, wheezing, and shortness of breath. Asthma episodes, also called asthma attacks, range from minor to life-threatening. Asthma cannot be cured, but medicines and lifestyle changes can help control it. °CAUSES °Asthma is believed to be caused by inherited (genetic) and environmental factors, but its exact cause is unknown. Asthma may be triggered by allergens, lung infections, or irritants in the air. Asthma triggers are different for each person. Common triggers include:  °Animal dander. °Dust mites. °Cockroaches. °Pollen from trees or grass. °Mold. °Smoke. °Air pollutants such as dust, household cleaners, hair sprays, aerosol sprays, paint fumes, strong chemicals, or strong odors. °Cold air, weather changes, and winds (which increase molds and pollens in the air). °Strong emotional expressions such as crying or laughing hard. °Stress. °Certain medicines (such as aspirin) or types of drugs (such as beta-blockers). °Sulfites in foods and drinks. Foods and drinks that may contain sulfites include dried fruit, potato chips, and sparkling grape juice. °Infections or inflammatory conditions such as the flu, a cold, or an inflammation of the nasal membranes (rhinitis). °Gastroesophageal reflux disease (GERD). °Exercise or strenuous activity. °SYMPTOMS °Symptoms may occur immediately after asthma is triggered or many hours later. Symptoms  include: °Wheezing. °Excessive nighttime or early morning coughing. °Frequent or severe coughing with a common cold. °Chest tightness. °Shortness of breath. °DIAGNOSIS  °The diagnosis of asthma is made by a review of your medical history and a physical exam. Tests may also be performed. These may include: °Lung function studies. These tests show how much air you breathe in and out. °Allergy tests. °Imaging tests such as X-rays. °TREATMENT  °Asthma cannot be cured, but it can usually be controlled. Treatment involves identifying and avoiding your asthma triggers. It also involves medicines. There are 2 classes of medicine used for asthma treatment:  °Controller medicines. These prevent asthma symptoms from occurring. They are usually taken every day. °Reliever or rescue medicines. These quickly relieve asthma symptoms. They are used as needed and provide short-term relief. °Your health care provider will help you create an asthma action plan. An asthma action plan is a written plan for managing and treating your asthma attacks. It includes a list of your asthma triggers and how they may be avoided. It also includes information on when medicines should be taken and when their dosage should be changed. An action plan may also involve the use of a device called a peak flow meter. A peak flow meter measures how well the lungs are working. It helps you monitor your condition. °HOME CARE INSTRUCTIONS  °Take medicines only as directed by your health care provider. Speak with your health care provider if you have questions about how or when to take the medicines. °Use a peak flow meter as directed by your health care provider. Record and keep track of readings. °Understand and use the action plan to help minimize or stop an asthma attack without needing to seek medical care. °Control your home environment in the following   ways to help prevent asthma attacks: °Do not smoke. Avoid being exposed to secondhand smoke. °Change  your heating and air conditioning filter regularly. °Limit your use of fireplaces and wood stoves. °Get rid of pests (such as roaches and mice) and their droppings. °Throw away plants if you see mold on them. °Clean your floors and dust regularly. Use unscented cleaning products. °Try to have someone else vacuum for you regularly. Stay out of rooms while they are being vacuumed and for a short while afterward. If you vacuum, use a dust mask from a hardware store, a double-layered or microfilter vacuum cleaner bag, or a vacuum cleaner with a HEPA filter. °Replace carpet with wood, tile, or vinyl flooring. Carpet can trap dander and dust. °Use allergy-proof pillows, mattress covers, and box spring covers. °Wash bed sheets and blankets every week in hot water and dry them in a dryer. °Use blankets that are made of polyester or cotton. °Clean bathrooms and kitchens with bleach. If possible, have someone repaint the walls in these rooms with mold-resistant paint. Keep out of the rooms that are being cleaned and painted. °Wash hands frequently. °SEEK MEDICAL CARE IF:  °You have wheezing, shortness of breath, or a cough even if taking medicine to prevent attacks. °The colored mucus you cough up (sputum) is thicker than usual. °Your sputum changes from clear or white to yellow, green, gray, or bloody. °You have any problems that may be related to the medicines you are taking (such as a rash, itching, swelling, or trouble breathing). °You are using a reliever medicine more than 2-3 times per week. °Your peak flow is still at 50-79% of your personal best after following your action plan for 1 hour. °You have a fever. °SEEK IMMEDIATE MEDICAL CARE IF:  °You seem to be getting worse and are unresponsive to treatment during an asthma attack. °You are short of breath even at rest. °You get short of breath when doing very little physical activity. °You have difficulty eating, drinking, or talking due to asthma symptoms. °You  develop chest pain. °You develop a fast heartbeat. °You have a bluish color to your lips or fingernails. °You are light-headed, dizzy, or faint. °Your peak flow is less than 50% of your personal best. °MAKE SURE YOU:  °Understand these instructions. °Will watch your condition. °Will get help right away if you are not doing well or get worse. °Document Released: 11/29/2005 Document Revised: 04/15/2014 Document Reviewed: 06/28/2013 °ExitCare® Patient Information ©2015 [employer]. This information is not intended to replace advice given to you by your health care provider. Make sure you discuss any questions you have with your health care provider. ° °How to Use a Nebulizer °If you have asthma or other breathing problems, you might need to breathe in (inhale) medicine. This can be done with a nebulizer. A nebulizer is a device that turns liquid medicine into a mist that you can inhale.  °There are different kinds of nebulizers. Most are small. With some, you breathe in through a mouthpiece. With others, a mask fits over your nose and mouth. Most nebulizers must be connected to a small air compressor. Air is forced through tubing from the compressor to the nebulizer. The forced air changes the liquid into a fine spray. °RISKS AND COMPLICATIONS °The nebulizer must work properly for it to help your breathing. If the nebulizer does not produce mist, or if foam comes out, this indicates that the nebulizer is not working properly. Sometimes a filter can get clogged, or   there might be a problem with the air compressor. Check the instruction booklet that came with your nebulizer. It should tell you how to fix problems or where to call for help. You should have at least one extra nebulizer at home. That way, you will always have one when you need it.  °HOW TO PREPARE BEFORE USING THE NEBULIZER °Take these steps before using the nebulizer: °Check your medicine. Make sure it has not expired and is not damaged in any way.    °Wash your hands with soap and water.   °Put all the parts of your nebulizer on a sturdy, flat surface. Make sure the tubing connects the compressor and the nebulizer. °Measure the liquid medicine according to your health care provider's instructions. Pour it into the nebulizer. °Attach the mouthpiece or mask.   °Test the nebulizer by turning it on to make sure a spray is coming out. Then, turn it off.   °HOW TO USE THE NEBULIZER °Sit down and focus on staying relaxed.   °If your nebulizer has a mask, put it over your nose and mouth. If you use a mouthpiece, put it in your mouth. Press your lips firmly around the mouthpiece. °Turn on the nebulizer.   °Breathe out.   °Some nebulizers have a finger valve. If yours does, cover up the air hole so the air gets to the nebulizer. °Once the medicine begins to mist out, take slow, deep breaths. If there is a finger valve, release it at the end of your breath. °Continue taking slow, deep breaths until the nebulizer is empty.   °Be sure to stop the machine at any point if you start coughing or if the medicine foams or bubbles. °HOW TO CLEAN THE NEBULIZER  °The nebulizer and all its parts must be kept very clean. Follow the manufacturer's instructions for cleaning. For most nebulizers, you should follow these guidelines: °Wash the nebulizer after each use. Use warm water and soap. Rinse it well. Shake the nebulizer to remove extra water. Put it on a clean towel until it is completely dry. To make sure it is dry, put the nebulizer back together. Turn on the compressor for a few minutes. This will blow air through the nebulizer.   °Do not wash the tubing or the finger valve.   °Store the nebulizer in a dust-free place.   °Inspect the filter every week. Replace it any time it looks dirty.   °Sometimes the nebulizer will need a more complete cleaning. The instruction booklet should say how often you need to do this. °SEEK MEDICAL CARE IF:  °You continue to have difficulty  breathing.   °You have trouble using the nebulizer.   °Document Released: 11/17/2009 Document Revised: 04/15/2014 Document Reviewed: 05/21/2013 °ExitCare® Patient Information ©2015 [employer]. This information is not intended to replace advice given to you by your health care provider. Make sure you discuss any questions you have with your health care provider. ° °How to Use an Inhaler °Proper inhaler technique is very important. Good technique ensures that the medicine reaches the lungs. Poor technique results in depositing the medicine on the tongue and back of the throat rather than in the airways. If you do not use the inhaler with good technique, the medicine will not help you. °STEPS TO FOLLOW IF USING AN INHALER WITHOUT AN EXTENSION TUBE °Remove the cap from the inhaler. °If you are using the inhaler for the first time, you will need to prime it. Shake the inhaler for   5 seconds and release four puffs into the air, away from your face. Ask your health care provider or pharmacist if you have questions about priming your inhaler. °Shake the inhaler for 5 seconds before each breath in (inhalation). °Position the inhaler so that the top of the canister faces up. °Put your index finger on the top of the medicine canister. Your thumb supports the bottom of the inhaler. °Open your mouth. °Either place the inhaler between your teeth and place your lips tightly around the mouthpiece, or hold the inhaler 1-2 inches away from your open mouth. If you are unsure of which technique to use, ask your health care provider. °Breathe out (exhale) normally and as completely as possible. °Press the canister down with your index finger to release the medicine. °At the same time as the canister is pressed, inhale deeply and slowly until your lungs are completely filled. This should take 4-6 seconds. Keep your tongue down. °Hold the medicine in your lungs for 5-10 seconds (10 seconds is best). This helps the medicine get into the  small airways of your lungs. °Breathe out slowly, through pursed lips. Whistling is an example of pursed lips. °Wait at least 15-30 seconds between puffs. Continue with the above steps until you have taken the number of puffs your health care provider has ordered. Do not use the inhaler more than your health care provider tells you. °Replace the cap on the inhaler. °Follow the directions from your health care provider or the inhaler insert for cleaning the inhaler. °STEPS TO FOLLOW IF USING AN INHALER WITH AN EXTENSION (SPACER) °Remove the cap from the inhaler. °If you are using the inhaler for the first time, you will need to prime it. Shake the inhaler for 5 seconds and release four puffs into the air, away from your face. Ask your health care provider or pharmacist if you have questions about priming your inhaler. °Shake the inhaler for 5 seconds before each breath in (inhalation). °Place the open end of the spacer onto the mouthpiece of the inhaler. °Position the inhaler so that the top of the canister faces up and the spacer mouthpiece faces you. °Put your index finger on the top of the medicine canister. Your thumb supports the bottom of the inhaler and the spacer. °Breathe out (exhale) normally and as completely as possible. °Immediately after exhaling, place the spacer between your teeth and into your mouth. Close your lips tightly around the spacer. °Press the canister down with your index finger to release the medicine. °At the same time as the canister is pressed, inhale deeply and slowly until your lungs are completely filled. This should take 4-6 seconds. Keep your tongue down and out of the way. °Hold the medicine in your lungs for 5-10 seconds (10 seconds is best). This helps the medicine get into the small airways of your lungs. Exhale. °Repeat inhaling deeply through the spacer mouthpiece. Again hold that breath for up to 10 seconds (10 seconds is best). Exhale slowly. If it is difficult to take  this second deep breath through the spacer, breathe normally several times through the spacer. Remove the spacer from your mouth. °Wait at least 15-30 seconds between puffs. Continue with the above steps until you have taken the number of puffs your health care provider has ordered. Do not use the inhaler more than your health care provider tells you. °Remove the spacer from the inhaler, and place the cap on the inhaler. °Follow the directions from your health care provider   or the inhaler insert for cleaning the inhaler and spacer. °If you are using different kinds of inhalers, use your quick relief medicine to open the airways 10-15 minutes before using a steroid if instructed to do so by your health care provider. If you are unsure which inhalers to use and the order of using them, ask your health care provider, nurse, or respiratory therapist. °If you are using a steroid inhaler, always rinse your mouth with water after your last puff, then gargle and spit out the water. Do not swallow the water. °AVOID: °Inhaling before or after starting the spray of medicine. It takes practice to coordinate your breathing with triggering the spray. °Inhaling through the nose (rather than the mouth) when triggering the spray. °HOW TO DETERMINE IF YOUR INHALER IS FULL OR NEARLY EMPTY °You cannot know when an inhaler is empty by shaking it. A few inhalers are now being made with dose counters. Ask your health care provider for a prescription that has a dose counter if you feel you need that extra help. If your inhaler does not have a counter, ask your health care provider to help you determine the date you need to refill your inhaler. Write the refill date on a calendar or your inhaler canister. Refill your inhaler 7-10 days before it runs out. Be sure to keep an adequate supply of medicine. This includes making sure it is not expired, and that you have a spare inhaler.  °SEEK MEDICAL CARE IF:  °Your symptoms are only partially  relieved with your inhaler. °You are having trouble using your inhaler. °You have some increase in phlegm. °SEEK IMMEDIATE MEDICAL CARE IF:  °You feel little or no relief with your inhalers. You are still wheezing and are feeling shortness of breath or tightness in your chest or both. °You have dizziness, headaches, or a fast heart rate. °You have chills, fever, or night sweats. °You have a noticeable increase in phlegm production, or there is blood in the phlegm. °MAKE SURE YOU:  °Understand these instructions. °Will watch your condition. °Will get help right away if you are not doing well or get worse. °Document Released: 11/26/2000 Document Revised: 09/19/2013 Document Reviewed: 06/28/2013 °Exit[employer]® Patient Information ©2015 [employer]. This information is not intended to replace advice given to you by your health care provider. Make sure you discuss any questions you have with your health care provider. ° ° ° °

## 2017-10-18 NOTE — ED Triage Notes (Signed)
Hx of asthma, reports cough and SOB since yesterday and ran out of her inhaler.

## 2017-12-01 ENCOUNTER — Other Ambulatory Visit: Payer: Self-pay

## 2017-12-01 ENCOUNTER — Emergency Department (HOSPITAL_BASED_OUTPATIENT_CLINIC_OR_DEPARTMENT_OTHER)
Admission: EM | Admit: 2017-12-01 | Discharge: 2017-12-01 | Disposition: A | Discharge status: Home / Self Care | Payer: No Typology Code available for payment source | Attending: Emergency Medicine | Admitting: Emergency Medicine

## 2017-12-01 ENCOUNTER — Emergency Department (HOSPITAL_BASED_OUTPATIENT_CLINIC_OR_DEPARTMENT_OTHER): Payer: No Typology Code available for payment source

## 2017-12-01 ENCOUNTER — Encounter (HOSPITAL_BASED_OUTPATIENT_CLINIC_OR_DEPARTMENT_OTHER): Payer: Self-pay | Admitting: *Deleted

## 2017-12-01 DIAGNOSIS — Z87891 Personal history of nicotine dependence: Secondary | ICD-10-CM | POA: Diagnosis not present

## 2017-12-01 DIAGNOSIS — R0602 Shortness of breath: Secondary | ICD-10-CM | POA: Diagnosis present

## 2017-12-01 DIAGNOSIS — I10 Essential (primary) hypertension: Secondary | ICD-10-CM | POA: Insufficient documentation

## 2017-12-01 DIAGNOSIS — E039 Hypothyroidism, unspecified: Secondary | ICD-10-CM | POA: Insufficient documentation

## 2017-12-01 DIAGNOSIS — J4541 Moderate persistent asthma with (acute) exacerbation: Secondary | ICD-10-CM | POA: Diagnosis not present

## 2017-12-01 MED ORDER — MAGNESIUM SULFATE 2 GM/50ML IV SOLN
2.0000 g | Freq: Once | INTRAVENOUS | Status: AC
  Administered 2017-12-01: 2 g via INTRAVENOUS
  Filled 2017-12-01: qty 50

## 2017-12-01 MED ORDER — ALBUTEROL SULFATE (2.5 MG/3ML) 0.083% IN NEBU: 2.5000 mg | INHALATION_SOLUTION | RESPIRATORY_TRACT | 0 refills | Status: DC | PRN

## 2017-12-01 MED ORDER — ALBUTEROL SULFATE (2.5 MG/3ML) 0.083% IN NEBU
INHALATION_SOLUTION | RESPIRATORY_TRACT | Status: AC
  Administered 2017-12-01: 2.5 mg
  Filled 2017-12-01: qty 3

## 2017-12-01 MED ORDER — ALBUTEROL (5 MG/ML) CONTINUOUS INHALATION SOLN
10.0000 mg/h | INHALATION_SOLUTION | RESPIRATORY_TRACT | Status: AC
  Administered 2017-12-01: 10 mg/h via RESPIRATORY_TRACT
  Filled 2017-12-01: qty 20

## 2017-12-01 MED ORDER — SODIUM CHLORIDE 0.9 % IV SOLN
INTRAVENOUS | Status: DC
  Administered 2017-12-01: 15:00:00 via INTRAVENOUS

## 2017-12-01 MED ORDER — IPRATROPIUM-ALBUTEROL 0.5-2.5 (3) MG/3ML IN SOLN
RESPIRATORY_TRACT | Status: AC
  Administered 2017-12-01: 3 mL
  Filled 2017-12-01: qty 3

## 2017-12-01 MED ORDER — ALBUTEROL SULFATE HFA 108 (90 BASE) MCG/ACT IN AERS
2.0000 | INHALATION_SPRAY | Freq: Once | RESPIRATORY_TRACT | Status: AC
  Administered 2017-12-01: 2 via RESPIRATORY_TRACT
  Filled 2017-12-01: qty 6.7

## 2017-12-01 MED ORDER — PREDNISONE 50 MG PO TABS: ORAL_TABLET | ORAL | 0 refills | Status: DC

## 2017-12-01 MED ORDER — METHYLPREDNISOLONE SODIUM SUCC 125 MG IJ SOLR
125.0000 mg | Freq: Once | INTRAMUSCULAR | Status: AC
  Administered 2017-12-01: 125 mg via INTRAVENOUS
  Filled 2017-12-01: qty 2

## 2017-12-01 NOTE — ED Provider Notes (Signed)
[facility] EMERGENCY DEPARTMENT Provider Note   CSN: [id number] Arrival date & time: 12/01/17  1345     History   Chief Complaint Chief Complaint  Patient presents with  . Asthma    HPI   Blood pressure (!) 155/100, pulse 98, temperature 98.3 F (36.8 C), temperature source Oral, resp. rate (!) 24, height 5\' 4"  (1.626 m), weight 128.4 kg (283 lb), SpO2 96 %.  Wendy Hoffman is a 51 y.o. female complaining of redness of breath worsening over the last several days.  She denies productive cough, fever, chills, rhinorrhea.  She endorses a chest tightness typical for her asthma exacerbations, she was intubated in the remote past, no recent hospitalizations for her asthma.  She has been using her inhaler at home with little relief.  Past Medical History:  Diagnosis Date  . Anemia   . Anxiety   . Arthritis   . Asthma   . Cancer ([place])    cervic  . H/O: hysterectomy   . High cholesterol   . Hypertension   . Hypothyroidism   . Insomnia   . Medical history non-contributory   . Migraine   . Morbid obesity ([place])   . Shortness of breath   . Stroke ([place])   . Thyroid disease     Patient Active Problem List   Diagnosis Date Noted  . Snoring 05/03/2016  . Morbid obesity ([place]) 04/25/2014  . Asthma exacerbation 04/22/2014  . Asthma attack 04/22/2014  . Asthma with acute exacerbation 11/03/2013  . Unspecified hypothyroidism 11/03/2013  . Tobacco abuse 11/03/2013  . Migraine 09/22/2012  . Weakness of right side of body 09/22/2012  . Thyroid disease   . Hypertension   . High cholesterol   . H/O: hysterectomy   . ASTHMA 11/26/2008  . HEADACHE, CHRONIC 11/26/2008    Past Surgical History:  Procedure Laterality Date  . ABDOMINAL HYSTERECTOMY    . CESAREAN SECTION     3 occasions  . CHOLECYSTECTOMY    . THYROIDECTOMY, PARTIAL     Goiter    OB History    No data available       Home Medications    Prior to Admission medications   Medication Sig Start  Date End Date Taking? Authorizing Provider  albuterol (PROVENTIL) (2.5 MG/3ML) 0.083% nebulizer solution Take 3 mLs (2.5 mg total) by nebulization every 4 (four) hours as needed for wheezing or shortness of breath. 12/01/17   Racquel Arkin, Elmyra Ricks, PA-C  budesonide-formoterol (SYMBICORT) 160-4.5 MCG/ACT inhaler Inhale 2 puffs into the lungs 2 (two) times daily.    [provider]  erythromycin ophthalmic ointment Place a 1/2 inch ribbon of ointment into the lower eyelid 4 times a day as directed 04/27/17   Katy Apo, NP  Ibuprofen-Diphenhydramine HCl (ADVIL PM) 200-25 MG CAPS Take 1-2 tablets by mouth at bedtime as needed (sleep).    [provider]  predniSONE (DELTASONE) 50 MG tablet Take 1 tablet daily with breakfast 12/01/17   Keyonta Barradas, Elmyra Ricks, PA-C    Family History Family History  Problem Relation Age of Onset  . Migraines Sister     Social History Social History   Tobacco Use  . Smoking status: Former Smoker    Types: Cigarettes  . Smokeless tobacco: Never Used  Substance Use Topics  . Alcohol use: No  . Drug use: No     Allergies   Blueberry [vaccinium angustifolium]; Mango flavor; Aspirin; and Penicillins   Review of Systems Review of Systems  A  complete review of systems was obtained and all systems are negative except as noted in the HPI and PMH.    Physical Exam Updated Vital Signs BP (!) 157/99 (BP Location: Right Wrist)   Pulse (!) 107   Temp (!) 97.5 F (36.4 C) (Oral)   Resp 18   Ht 5\' 4"  (1.626 m)   Wt 128.4 kg (283 lb)   SpO2 100%   BMI 48.58 kg/m   Physical Exam  Constitutional: She is oriented to person, place, and time. She appears well-developed and well-nourished. No distress.  HENT:  Head: Normocephalic and atraumatic.  Mouth/Throat: Oropharynx is clear and moist.  Eyes: Conjunctivae and EOM are normal. Pupils are equal, round, and reactive to light.  Neck: Normal range of motion.  Cardiovascular: Normal rate,  regular rhythm and intact distal pulses.  Pulmonary/Chest: Effort normal. She has wheezes.  Speaking in mildly shortened sentences, use, no stridor, diffuse, moderate expiratory wheezing  Abdominal: Soft. There is no tenderness.  Musculoskeletal: Normal range of motion.  Neurological: She is alert and oriented to person, place, and time.  Skin: She is not diaphoretic.  Psychiatric: She has a normal mood and affect.  Nursing note and vitals reviewed.    ED Treatments / Results  Labs (all labs ordered are listed, but only abnormal results are displayed) Labs Reviewed - No data to display  EKG  EKG Interpretation None       Radiology Dg Chest [facility] 1 View  Result Date: 12/01/2017 CLINICAL DATA:  Shortness of breath EXAM: PORTABLE CHEST 1 VIEW COMPARISON:  04/10/2017 FINDINGS: The heart size and mediastinal contours are within normal limits. Both lungs are clear. The visualized skeletal structures are unremarkable. IMPRESSION: No active disease. Electronically Signed   By: Donavan Foil M.D.   On: 12/01/2017 14:13    Procedures Procedures (including critical care time)  Medications Ordered in ED Medications  0.9 %  sodium chloride infusion ( Intravenous Stopped 12/01/17 1618)  albuterol (PROVENTIL,VENTOLIN) solution continuous neb (10 mg/hr Nebulization New Bag/Given 12/01/17 1511)  albuterol (PROVENTIL HFA;VENTOLIN HFA) 108 (90 Base) MCG/ACT inhaler 2 puff (not administered)  albuterol (PROVENTIL) (2.5 MG/3ML) 0.083% nebulizer solution (2.5 mg  Given 12/01/17 1356)  ipratropium-albuterol (DUONEB) 0.5-2.5 (3) MG/3ML nebulizer solution (3 mLs  Given 12/01/17 1357)  methylPREDNISolone sodium succinate (SOLU-MEDROL) 125 mg/2 mL injection 125 mg (125 mg Intravenous Given 12/01/17 1436)  magnesium sulfate IVPB 2 g 50 mL (0 g Intravenous Stopped 12/01/17 1618)     Initial Impression / Assessment and Plan / ED Course  I have reviewed the triage vital signs and the nursing  notes.  Pertinent labs & imaging results that were available during my care of the patient were reviewed by me and considered in my medical decision making (see chart for details).     Vitals:   12/01/17 1349 12/01/17 1357 12/01/17 1559 12/01/17 1618  BP: (!) 155/100  137/73 (!) 157/99  Pulse:   96 (!) 107  Resp:   18 18  Temp:    (!) 97.5 F (36.4 C)  TempSrc:    Oral  SpO2:  97% 100% 100%  Weight:      Height:        Medications  0.9 %  sodium chloride infusion ( Intravenous Stopped 12/01/17 1618)  albuterol (PROVENTIL,VENTOLIN) solution continuous neb (10 mg/hr Nebulization New Bag/Given 12/01/17 1511)  albuterol (PROVENTIL HFA;VENTOLIN HFA) 108 (90 Base) MCG/ACT inhaler 2 puff (not administered)  albuterol (PROVENTIL) (2.5 MG/3ML) 0.083% nebulizer solution (  2.5 mg  Given 12/01/17 1356)  ipratropium-albuterol (DUONEB) 0.5-2.5 (3) MG/3ML nebulizer solution (3 mLs  Given 12/01/17 1357)  methylPREDNISolone sodium succinate (SOLU-MEDROL) 125 mg/2 mL injection 125 mg (125 mg Intravenous Given 12/01/17 1436)  magnesium sulfate IVPB 2 g 50 mL (0 g Intravenous Stopped 12/01/17 1618)    Shakeia Krus is 51 y.o. female presenting with shortness of breath, consistent with COPD asthma exacerbation.  Patient wheezing on my exam, no significant chest pain or peripheral edema C to suggest cardiac pathology.  Patient given Solu-Medrol, mag, gently hydrated and DuoNeb.  Patient is significantly wheezing after treatment, will try hour-long nebulizer.  Patient with wheezing resolved after hour-long nebulizer, will give her albuterol inhaler in the ED, prednisone burst and nebulizer ampules to go home with.  Evaluation does not show pathology that would require ongoing emergent intervention or inpatient treatment. Pt is hemodynamically stable and mentating appropriately. Discussed findings and plan with patient/guardian, who agrees with care plan. All questions answered. Return precautions  discussed and outpatient follow up given.      Final Clinical Impressions(s) / ED Diagnoses   Final diagnoses:  Moderate persistent asthma with exacerbation    ED Discharge Orders        Ordered    predniSONE (DELTASONE) 50 MG tablet     12/01/17 1603    albuterol (PROVENTIL) (2.5 MG/3ML) 0.083% nebulizer solution  Every 4 hours PRN     12/01/17 1603       Helaman Mecca, Charna Elizabeth 12/01/17 1605    Marty Sadlowski, Elmyra Ricks, PA-C 12/01/17 1620    Quintella Reichert, MD 12/01/17 [phone]

## 2017-12-01 NOTE — ED Triage Notes (Signed)
Pt c/o SOb x 3 days out of inhalers HX asthma

## 2017-12-01 NOTE — Discharge Instructions (Signed)
Please follow with your primary care doctor in the next 2 days for a check-up. They must obtain records for further management.  ° °Do not hesitate to return to the Emergency Department for any new, worsening or concerning symptoms.  ° °

## 2017-12-29 ENCOUNTER — Other Ambulatory Visit: Payer: Self-pay

## 2017-12-29 ENCOUNTER — Encounter (HOSPITAL_BASED_OUTPATIENT_CLINIC_OR_DEPARTMENT_OTHER): Payer: Self-pay | Admitting: *Deleted

## 2017-12-29 ENCOUNTER — Emergency Department (HOSPITAL_BASED_OUTPATIENT_CLINIC_OR_DEPARTMENT_OTHER): Payer: No Typology Code available for payment source

## 2017-12-29 ENCOUNTER — Emergency Department (HOSPITAL_BASED_OUTPATIENT_CLINIC_OR_DEPARTMENT_OTHER)
Admission: EM | Admit: 2017-12-29 | Discharge: 2017-12-29 | Disposition: A | Discharge status: Home / Self Care | Payer: No Typology Code available for payment source | Attending: Emergency Medicine | Admitting: Emergency Medicine

## 2017-12-29 DIAGNOSIS — Z87891 Personal history of nicotine dependence: Secondary | ICD-10-CM | POA: Insufficient documentation

## 2017-12-29 DIAGNOSIS — E039 Hypothyroidism, unspecified: Secondary | ICD-10-CM | POA: Insufficient documentation

## 2017-12-29 DIAGNOSIS — J45909 Unspecified asthma, uncomplicated: Secondary | ICD-10-CM | POA: Insufficient documentation

## 2017-12-29 DIAGNOSIS — J189 Pneumonia, unspecified organism: Secondary | ICD-10-CM

## 2017-12-29 DIAGNOSIS — J181 Lobar pneumonia, unspecified organism: Secondary | ICD-10-CM | POA: Insufficient documentation

## 2017-12-29 DIAGNOSIS — I10 Essential (primary) hypertension: Secondary | ICD-10-CM | POA: Insufficient documentation

## 2017-12-29 DIAGNOSIS — Z79899 Other long term (current) drug therapy: Secondary | ICD-10-CM | POA: Insufficient documentation

## 2017-12-29 DIAGNOSIS — Z8541 Personal history of malignant neoplasm of cervix uteri: Secondary | ICD-10-CM | POA: Insufficient documentation

## 2017-12-29 MED ORDER — IPRATROPIUM-ALBUTEROL 0.5-2.5 (3) MG/3ML IN SOLN
RESPIRATORY_TRACT | Status: AC
  Filled 2017-12-29: qty 3

## 2017-12-29 MED ORDER — FLUTICASONE PROPIONATE 50 MCG/ACT NA SUSP: 1.0000 | Freq: Every day | NASAL | 2 refills | Status: DC

## 2017-12-29 MED ORDER — PROCHLORPERAZINE EDISYLATE 5 MG/ML IJ SOLN
10.0000 mg | Freq: Once | INTRAMUSCULAR | Status: AC
  Administered 2017-12-29: 10 mg via INTRAVENOUS
  Filled 2017-12-29: qty 2

## 2017-12-29 MED ORDER — IPRATROPIUM-ALBUTEROL 0.5-2.5 (3) MG/3ML IN SOLN
3.0000 mL | Freq: Four times a day (QID) | RESPIRATORY_TRACT | Status: DC
  Administered 2017-12-29 (×2): 3 mL via RESPIRATORY_TRACT
  Filled 2017-12-29: qty 3

## 2017-12-29 MED ORDER — PREDNISONE 10 MG PO TABS
60.0000 mg | ORAL_TABLET | Freq: Once | ORAL | Status: AC
  Administered 2017-12-29: 17:00:00 60 mg via ORAL
  Filled 2017-12-29: qty 1

## 2017-12-29 MED ORDER — SODIUM CHLORIDE 0.9 % IV BOLUS (SEPSIS)
1000.0000 mL | Freq: Once | INTRAVENOUS | Status: AC
  Administered 2017-12-29: 1000 mL via INTRAVENOUS

## 2017-12-29 MED ORDER — ALBUTEROL SULFATE (2.5 MG/3ML) 0.083% IN NEBU
INHALATION_SOLUTION | RESPIRATORY_TRACT | Status: AC
  Filled 2017-12-29: qty 3

## 2017-12-29 MED ORDER — ALBUTEROL SULFATE (2.5 MG/3ML) 0.083% IN NEBU
2.5000 mg | INHALATION_SOLUTION | Freq: Once | RESPIRATORY_TRACT | Status: AC
  Administered 2017-12-29: 2.5 mg via RESPIRATORY_TRACT

## 2017-12-29 MED ORDER — KETOROLAC TROMETHAMINE 15 MG/ML IJ SOLN
15.0000 mg | Freq: Once | INTRAMUSCULAR | Status: AC
  Administered 2017-12-29: 15 mg via INTRAVENOUS
  Filled 2017-12-29: qty 1

## 2017-12-29 MED ORDER — PREDNISONE 10 MG (21) PO TBPK: ORAL_TABLET | ORAL | 0 refills | Status: DC

## 2017-12-29 MED ORDER — DOXYCYCLINE HYCLATE 100 MG PO CAPS: 100.0000 mg | ORAL_CAPSULE | Freq: Two times a day (BID) | ORAL | 0 refills | Status: DC

## 2017-12-29 NOTE — ED Triage Notes (Signed)
Ptc/o URi symptoms  4 days HX asthma

## 2017-12-29 NOTE — Discharge Instructions (Signed)
You were seen in the emergency department and diagnosed with pneumonia. We have given you  multiple prescriptions for this:   - Doxycycline- this is an antibiotic to treat the pneumonia. Please take all of your antibiotics until finished. You may develop abdominal discomfort or diarrhea from the antibiotic.  You may help offset this with probiotics which you can buy at the store (ask your pharmacist if unable to find) or get probiotics in the form of eating yogurt. Do not eat or take the probiotics until 2 hours after your antibiotic. If you are unable to tolerate these side effects follow-up with your primary care provider or return to the emergency department.   If you begin to experience any blistering, rashes, swelling, or difficulty breathing seek medical care for evaluation of potentially more serious side effects.   Please be aware that this medication may interact with other medications you are taking, please be sure to discuss your medication list with your pharmacist.   - Flonase- this is a nasal spray to help with congestion  - Prednisone- this is a steroid to help with wheezing and difficulty breathing   Your primary care provider in 5 days for reevaluation, if you do not have a primary care provider 1 is provided in your discharge instructions.  Return to the emergency department for any new or worsening symptoms including but not limited to difficulty breathing, chest pain, you heart beating funny or fast, or any other concerns.

## 2017-12-29 NOTE — ED Provider Notes (Signed)
[facility] EMERGENCY DEPARTMENT Provider Note   CSN: [id number] Arrival date & time: 12/29/17  1433     History   Chief Complaint Chief Complaint  Patient presents with  . URI    HPI Wendy Hoffman is a 52 y.o. female with a hx of asthma, HTN, hypothyroidism, and anemia who presents to the ED with complaint of URI symptoms that have been worsening over the past 5 days. Patient reports rhinorrhea, congestion, dry cough, headaches, myalgias, and fevers with temp max of 100.9 yesterday. States she has had some wheezing and difficulty breathing more so through her nose.  Patient states she has a hx of migraines and this feels similar in terms of her headache- states has been gradually worsening, rates a 9/10 in severity at present. Has been using her inhaler and nebulizer with some relief, has tried ibuprofen, naproxen, and tylenol for the headache with minimal improvement. No other alleviating/aggravating factors. Denies chest pain, palpitations, abdominal pain, or N/V/D. No sore throat or ear pain. Denies numbness, weakness, or change in vision.  HPI  Past Medical History:  Diagnosis Date  . Anemia   . Anxiety   . Arthritis   . Asthma   . Cancer ([place])    cervic  . H/O: hysterectomy   . High cholesterol   . Hypertension   . Hypothyroidism   . Insomnia   . Medical history non-contributory   . Migraine   . Morbid obesity ([place])   . Shortness of breath   . Stroke ([place])   . Thyroid disease     Patient Active Problem List   Diagnosis Date Noted  . Snoring 05/03/2016  . Morbid obesity ([place]) 04/25/2014  . Asthma exacerbation 04/22/2014  . Asthma attack 04/22/2014  . Asthma with acute exacerbation 11/03/2013  . Unspecified hypothyroidism 11/03/2013  . Tobacco abuse 11/03/2013  . Migraine 09/22/2012  . Weakness of right side of body 09/22/2012  . Thyroid disease   . Hypertension   . High cholesterol   . H/O: hysterectomy   . ASTHMA 11/26/2008  . HEADACHE,  CHRONIC 11/26/2008    Past Surgical History:  Procedure Laterality Date  . ABDOMINAL HYSTERECTOMY    . CESAREAN SECTION     3 occasions  . CHOLECYSTECTOMY    . THYROIDECTOMY, PARTIAL     Goiter    OB History    No data available       Home Medications    Prior to Admission medications   Medication Sig Start Date End Date Taking? Authorizing Provider  albuterol (PROVENTIL) (2.5 MG/3ML) 0.083% nebulizer solution Take 3 mLs (2.5 mg total) by nebulization every 4 (four) hours as needed for wheezing or shortness of breath. 12/01/17   Pisciotta, Elmyra Ricks, PA-C  budesonide-formoterol (SYMBICORT) 160-4.5 MCG/ACT inhaler Inhale 2 puffs into the lungs 2 (two) times daily.    [provider]  Ibuprofen-Diphenhydramine HCl (ADVIL PM) 200-25 MG CAPS Take 1-2 tablets by mouth at bedtime as needed (sleep).    [provider]    Family History Family History  Problem Relation Age of Onset  . Migraines Sister     Social History Social History   Tobacco Use  . Smoking status: Former Smoker    Types: Cigarettes  . Smokeless tobacco: Never Used  Substance Use Topics  . Alcohol use: No  . Drug use: No     Allergies   Blueberry [vaccinium angustifolium]; Mango flavor; Aspirin; and Penicillins   Review of Systems Review of Systems  Constitutional: Positive for fever.  HENT: Positive for congestion and rhinorrhea. Negative for ear pain and sore throat.   Eyes: Negative for photophobia and visual disturbance.  Respiratory: Positive for cough, shortness of breath and wheezing.   Cardiovascular: Negative for chest pain, palpitations and leg swelling.  Gastrointestinal: Negative for abdominal pain, blood in stool, constipation, diarrhea, nausea and vomiting.  Genitourinary: Negative for dysuria.  Musculoskeletal: Positive for myalgias (generalized).  Neurological: Positive for headaches. Negative for weakness and numbness.  All other systems reviewed and are  negative.   Physical Exam Updated Vital Signs BP (!) 142/95   Pulse 89   Temp 98.1 F (36.7 C)   Resp 18   Ht 5\' 4"  (1.626 m)   Wt 127.5 kg (281 lb)   SpO2 98%   BMI 48.23 kg/m   Physical Exam  Constitutional: She appears well-developed and well-nourished.  Non-toxic appearance. No distress.  HENT:  Head: Normocephalic and atraumatic.  Right Ear: Tympanic membrane is not perforated, not erythematous, not retracted and not bulging.  Left Ear: Tympanic membrane is not perforated, not erythematous, not retracted and not bulging.  Nose: Mucosal edema present.  Mouth/Throat: Uvula is midline and oropharynx is clear and moist. No oropharyngeal exudate or posterior oropharyngeal erythema.  Eyes: Conjunctivae and EOM are normal. Pupils are equal, round, and reactive to light. Right eye exhibits no discharge. Left eye exhibits no discharge.  Neck: Normal range of motion. Neck supple.  Cardiovascular: Normal rate and regular rhythm.  No murmur heard. Pulmonary/Chest: No accessory muscle usage. No tachypnea. No respiratory distress. She has wheezes (diffuse expiratory). She has no rales.  Air movement is somewhat poor throughout   Abdominal: Soft. She exhibits no distension. There is no tenderness.  Lymphadenopathy:    She has no cervical adenopathy.  Neurological: She is alert.  Alert. Clear speech. No facial droop. CNIII-XII are intact. Gait is normal.   Skin: Skin is warm and dry. No rash noted.  Psychiatric: She has a normal mood and affect. Her behavior is normal.  Nursing note and vitals reviewed.  ED Treatments / Results  Labs (all labs ordered are listed, but only abnormal results are displayed) Labs Reviewed - No data to display  EKG  EKG Interpretation None       Radiology Dg Chest 2 View  Result Date: 12/29/2017 CLINICAL DATA:  Shortness of breath and cough EXAM: CHEST  2 VIEW COMPARISON:  12/01/2017 FINDINGS: Mild infiltrate at the right base. No pleural  effusion. Normal heart size. No pneumothorax. IMPRESSION: Mild infiltrate at the right lung base. Electronically Signed   By: Donavan Foil M.D.   On: 12/29/2017 16:56    Procedures Procedures (including critical care time)  Medications Ordered in ED Medications  albuterol (PROVENTIL) (2.5 MG/3ML) 0.083% nebulizer solution (not administered)  ipratropium-albuterol (DUONEB) 0.5-2.5 (3) MG/3ML nebulizer solution (not administered)  ipratropium-albuterol (DUONEB) 0.5-2.5 (3) MG/3ML nebulizer solution 3 mL (3 mLs Nebulization Given 12/29/17 1451)  albuterol (PROVENTIL) (2.5 MG/3ML) 0.083% nebulizer solution 2.5 mg (2.5 mg Nebulization Given 12/29/17 1451)     Initial Impression / Assessment and Plan / ED Course  I have reviewed the triage vital signs and the nursing notes.  Pertinent labs & imaging results that were available during my care of the patient were reviewed by me and considered in my medical decision making (see chart for details).   Presents with complaint of cough.  She is nontoxic-appearing, in no apparent distress.  Upon initial evaluation patient's vitals are  within normal limits other than blood pressure which is elevated.  Patient with diffuse expiratory wheezing on exam, no signs of respiratory distress.  Will treat with DuoNeb, prednisone, and obtain chest x-ray.  Will give Toradol and Compazine as well as fluids for patient's headache.Chest x-ray revealed mild infiltrate at the right lung base.   On re-evaluation patient is moving much better air, mild expiratory wheeze at the bases remains.  No signs of respiratory distress, SpO2 remains > 95% on room air.  Headache has improved.  18:40: Patient ambulated in the emergency department SPO2 remained greater than 95%  Patient with tachycardia (100-110), suspect related to albuterol following DuoNeb x2. Patient denies chest pain, leg pain/swelling, hemoptysis, recent surgery/trauma, recent long travel, hormone use, personal hx  of cancer tx within past 6 months, or hx of DVT/PE, doubt pulmonary embolism. Will treat patient with doxycycline for community-acquired pneumonia.  Will provide prescription for prednisone and flonase. I discussed results, treatment plan, need for PCP follow-up, and return precautions with the patient. Provided opportunity for questions, patient confirmed understanding and is in agreement with plan.   Final Clinical Impressions(s) / ED Diagnoses   Final diagnoses:  Community acquired pneumonia of right lower lobe of lung [facility])    ED Discharge Orders        Ordered    doxycycline (VIBRAMYCIN) 100 MG capsule  2 times daily     12/29/17 1839    predniSONE (STERAPRED UNI-PAK 21 TAB) 10 MG (21) TBPK tablet     12/29/17 1839    fluticasone (FLONASE) 50 MCG/ACT nasal spray  Daily     12/29/17 1839       Amaryllis Dyke, PA-C 12/29/17 1858    Tegeler, Gwenyth Allegra, MD 12/30/17 [phone]

## 2017-12-29 NOTE — ED Notes (Signed)
Pt.  Here with c/o HA and URI symptoms   Pt.  Has noted wheezing insp. And exp. Bilat.  Has had breathing treatment and has cough.  Pt. In no distress but reports she feels a bit dehydrated.

## 2017-12-29 NOTE — ED Notes (Signed)
Walked pt. Around nsg station HR 109 and pulse ox 97% on RA

## 2017-12-29 NOTE — ED Notes (Signed)
Walked Pt. Around nurses station ... Pt. Had no difficulty walking and no shob noted.  Pt. Reports that she feels better and has no headache any longer.  Pt. In no distress with wheezing still noted bilat.

## 2018-03-07 ENCOUNTER — Emergency Department (HOSPITAL_BASED_OUTPATIENT_CLINIC_OR_DEPARTMENT_OTHER)
Admission: EM | Admit: 2018-03-07 | Discharge: 2018-03-07 | Disposition: A | Discharge status: Home / Self Care | Payer: No Typology Code available for payment source | Attending: Emergency Medicine | Admitting: Emergency Medicine

## 2018-03-07 ENCOUNTER — Encounter (HOSPITAL_BASED_OUTPATIENT_CLINIC_OR_DEPARTMENT_OTHER): Payer: Self-pay | Admitting: Emergency Medicine

## 2018-03-07 ENCOUNTER — Other Ambulatory Visit: Payer: Self-pay

## 2018-03-07 DIAGNOSIS — I10 Essential (primary) hypertension: Secondary | ICD-10-CM | POA: Diagnosis not present

## 2018-03-07 DIAGNOSIS — E039 Hypothyroidism, unspecified: Secondary | ICD-10-CM | POA: Insufficient documentation

## 2018-03-07 DIAGNOSIS — Z87891 Personal history of nicotine dependence: Secondary | ICD-10-CM | POA: Insufficient documentation

## 2018-03-07 DIAGNOSIS — Z8541 Personal history of malignant neoplasm of cervix uteri: Secondary | ICD-10-CM | POA: Insufficient documentation

## 2018-03-07 DIAGNOSIS — R0602 Shortness of breath: Secondary | ICD-10-CM | POA: Diagnosis present

## 2018-03-07 DIAGNOSIS — J4541 Moderate persistent asthma with (acute) exacerbation: Secondary | ICD-10-CM

## 2018-03-07 MED ORDER — ALBUTEROL SULFATE HFA 108 (90 BASE) MCG/ACT IN AERS
4.0000 | INHALATION_SPRAY | Freq: Once | RESPIRATORY_TRACT | Status: AC
  Administered 2018-03-07: 4 via RESPIRATORY_TRACT
  Filled 2018-03-07: qty 6.7

## 2018-03-07 MED ORDER — IPRATROPIUM-ALBUTEROL 0.5-2.5 (3) MG/3ML IN SOLN
3.0000 mL | RESPIRATORY_TRACT | Status: AC
  Administered 2018-03-07 (×3): 3 mL via RESPIRATORY_TRACT

## 2018-03-07 MED ORDER — PREDNISONE 50 MG PO TABS
60.0000 mg | ORAL_TABLET | Freq: Once | ORAL | Status: AC
  Administered 2018-03-07: 11:00:00 60 mg via ORAL
  Filled 2018-03-07: qty 1

## 2018-03-07 MED ORDER — PREDNISONE 20 MG PO TABS: ORAL_TABLET | ORAL | 0 refills | Status: DC

## 2018-03-07 MED ORDER — IPRATROPIUM-ALBUTEROL 0.5-2.5 (3) MG/3ML IN SOLN
RESPIRATORY_TRACT | Status: AC
  Administered 2018-03-07: 3 mL via RESPIRATORY_TRACT
  Filled 2018-03-07: qty 9

## 2018-03-07 NOTE — Discharge Instructions (Signed)
Use your inhaler every 4 hours(6 puffs) while awake, return for sudden worsening shortness of breath, or if you need to use your inhaler more often.  ° °

## 2018-03-07 NOTE — Progress Notes (Signed)
CSW spoke with MD and was informed that pt is needing help with bills as well as needing help with Medicaid application. CSW advised MD that CSW does not complete  Medicaid Application and suggested that pt go to [facility] to get further assistance with this at this time. CSW provided MD with [employer] to see if pt can get assistance with paying electricity bill as requested. At this time MD expressed understanding of this information and would have pt follow up with these resources. Address for [facility] has been provided to MD for pt at this time.    Virgie Dad Abryanna Musolino, MSW, [facility] Clinical Social Worker [phone]

## 2018-03-07 NOTE — ED Provider Notes (Signed)
[facility] EMERGENCY DEPARTMENT Provider Note   CSN: [id number] Arrival date & time: 03/07/18  1029     History   Chief Complaint Chief Complaint  Patient presents with  . Shortness of Breath    HPI Wendy Hoffman is a 52 y.o. female.  52 yo F with a chief complaint of shortness of breath.  Patient has a history of asthma and thinks this is her normal asthma exacerbation.  She has been without her home nebulizer because she has not paid her power bill and was unable to run it because it requires electricity.  She also had run out of her pump inhaler.  She came to the ED to apply for Medicaid as well as to get a letter stating that she needs her power turned back on.  The history is provided by the patient.  Shortness of Breath  This is a new problem. The average episode lasts 2 days. The problem occurs continuously.The current episode started 2 days ago. The problem has not changed since onset.Associated symptoms include cough. Pertinent negatives include no fever, no headaches, no rhinorrhea, no wheezing, no chest pain and no vomiting. She has tried nothing for the symptoms. The treatment provided no relief. She has had prior hospitalizations. She has had prior ED visits. She has had no prior ICU admissions. Associated medical issues include asthma.    Past Medical History:  Diagnosis Date  . Anemia   . Anxiety   . Arthritis   . Asthma   . Cancer ([place])    cervic  . H/O: hysterectomy   . High cholesterol   . Hypertension   . Hypothyroidism   . Insomnia   . Medical history non-contributory   . Migraine   . Morbid obesity ([place])   . Shortness of breath   . Stroke ([place])   . Thyroid disease     Patient Active Problem List   Diagnosis Date Noted  . Snoring 05/03/2016  . Morbid obesity ([place]) 04/25/2014  . Asthma exacerbation 04/22/2014  . Asthma attack 04/22/2014  . Asthma with acute exacerbation 11/03/2013  . Unspecified hypothyroidism 11/03/2013  .  Tobacco abuse 11/03/2013  . Migraine 09/22/2012  . Weakness of right side of body 09/22/2012  . Thyroid disease   . Hypertension   . High cholesterol   . H/O: hysterectomy   . ASTHMA 11/26/2008  . HEADACHE, CHRONIC 11/26/2008    Past Surgical History:  Procedure Laterality Date  . ABDOMINAL HYSTERECTOMY    . CESAREAN SECTION     3 occasions  . CHOLECYSTECTOMY    . THYROIDECTOMY, PARTIAL     Goiter     OB History   None      Home Medications    Prior to Admission medications   Medication Sig Start Date End Date Taking? Authorizing Provider  albuterol (PROVENTIL) (2.5 MG/3ML) 0.083% nebulizer solution Take 3 mLs (2.5 mg total) by nebulization every 4 (four) hours as needed for wheezing or shortness of breath. 12/01/17   Pisciotta, Elmyra Ricks, PA-C  budesonide-formoterol (SYMBICORT) 160-4.5 MCG/ACT inhaler Inhale 2 puffs into the lungs 2 (two) times daily.    [provider]  doxycycline (VIBRAMYCIN) 100 MG capsule Take 1 capsule (100 mg total) by mouth 2 (two) times daily. 12/29/17   Petrucelli, Samantha R, PA-C  fluticasone (FLONASE) 50 MCG/ACT nasal spray Place 1 spray into both nostrils daily. 12/29/17   Petrucelli, Samantha R, PA-C  Ibuprofen-Diphenhydramine HCl (ADVIL PM) 200-25 MG CAPS Take 1-2 tablets by  mouth at bedtime as needed (sleep).    [provider]  predniSONE (DELTASONE) 20 MG tablet 2 tabs po daily x 4 days 03/07/18   Deno Etienne, DO    Family History Family History  Problem Relation Age of Onset  . Migraines Sister     Social History Social History   Tobacco Use  . Smoking status: Former Smoker    Types: Cigarettes  . Smokeless tobacco: Never Used  Substance Use Topics  . Alcohol use: No  . Drug use: No     Allergies   Blueberry [vaccinium angustifolium]; Mango flavor; Aspirin; and Penicillins   Review of Systems Review of Systems  Constitutional: Negative for chills and fever.  HENT: Negative for congestion and rhinorrhea.    Eyes: Negative for redness and visual disturbance.  Respiratory: Positive for cough and shortness of breath. Negative for wheezing.   Cardiovascular: Negative for chest pain and palpitations.  Gastrointestinal: Negative for nausea and vomiting.  Genitourinary: Negative for dysuria and urgency.  Musculoskeletal: Negative for arthralgias and myalgias.  Skin: Negative for pallor and wound.  Neurological: Negative for dizziness and headaches.     Physical Exam Updated Vital Signs BP (!) 136/94 (BP Location: Left Arm)   Pulse 86   Temp 98.7 F (37.1 C) (Oral)   Resp 20   Ht 5\' 4"  (1.626 m)   Wt 127 kg (280 lb)   SpO2 100%   BMI 48.06 kg/m   Physical Exam  Constitutional: She is oriented to person, place, and time. She appears well-developed and well-nourished. No distress.  HENT:  Head: Normocephalic and atraumatic.  Eyes: Pupils are equal, round, and reactive to light. EOM are normal.  Neck: Normal range of motion. Neck supple.  Cardiovascular: Normal rate and regular rhythm. Exam reveals no gallop and no friction rub.  No murmur heard. Pulmonary/Chest: Effort normal. She has no wheezes. She has no rales.  Slightly diminished in all fields  Abdominal: Soft. She exhibits no distension. There is no tenderness.  Musculoskeletal: She exhibits no edema or tenderness.  Neurological: She is alert and oriented to person, place, and time.  Skin: Skin is warm and dry. She is not diaphoretic.  Psychiatric: She has a normal mood and affect. Her behavior is normal.  Nursing note and vitals reviewed.    ED Treatments / Results  Labs (all labs ordered are listed, but only abnormal results are displayed) Labs Reviewed - No data to display  EKG None  Radiology No results found.  Procedures Procedures (including critical care time)  Medications Ordered in ED Medications  predniSONE (DELTASONE) tablet 60 mg (has no administration in time range)  albuterol (PROVENTIL  HFA;VENTOLIN HFA) 108 (90 Base) MCG/ACT inhaler 4 puff (has no administration in time range)  ipratropium-albuterol (DUONEB) 0.5-2.5 (3) MG/3ML nebulizer solution 3 mL (3 mLs Nebulization Given 03/07/18 1102)     Initial Impression / Assessment and Plan / ED Course  I have reviewed the triage vital signs and the nursing notes.  Pertinent labs & imaging results that were available during my care of the patient were reviewed by me and considered in my medical decision making (see chart for details).     52 yo F with a history of asthma here with a chief complaint of shortness of breath.  Patient is feeling much better after 3 duo nebs back-to-back.  She is clear lung sounds and good aeration.  We will give a dose of steroids and started on burst of steroids.  The patient is having trouble paying her electric bill and therefore is not been able to use her breathing machine at home.  We will given an albuterol inhaler.  Consult to social work.  11:12 AM:  I have discussed the diagnosis/risks/treatment options with the patient and family and believe the pt to be eligible for discharge home to follow-up with PCP. We also discussed returning to the ED immediately if new or worsening sx occur. We discussed the sx which are most concerning (e.g., sudden worsening pain, fever, inability to tolerate by mouth) that necessitate immediate return. Medications administered to the patient during their visit and any new prescriptions provided to the patient are listed below.  Medications given during this visit Medications  predniSONE (DELTASONE) tablet 60 mg (has no administration in time range)  albuterol (PROVENTIL HFA;VENTOLIN HFA) 108 (90 Base) MCG/ACT inhaler 4 puff (has no administration in time range)  ipratropium-albuterol (DUONEB) 0.5-2.5 (3) MG/3ML nebulizer solution 3 mL (3 mLs Nebulization Given 03/07/18 1102)     The patient appears reasonably screen and/or stabilized for discharge and I doubt any  other medical condition or other [facility] requiring further screening, evaluation, or treatment in the ED at this time prior to discharge.    Final Clinical Impressions(s) / ED Diagnoses   Final diagnoses:  Moderate persistent asthma with exacerbation    ED Discharge Orders        Ordered    predniSONE (DELTASONE) 20 MG tablet     03/07/18 1107       Deno Etienne, DO 03/07/18 1112

## 2018-03-07 NOTE — ED Triage Notes (Signed)
Asthma attack since yesterday.  Electricity at home is off so she could not use her neb.  Only had HHN.  Insp  & Exp wheezing.  Dry cough.

## 2018-09-15 ENCOUNTER — Encounter (HOSPITAL_BASED_OUTPATIENT_CLINIC_OR_DEPARTMENT_OTHER): Payer: Self-pay | Admitting: *Deleted

## 2018-09-15 ENCOUNTER — Emergency Department (HOSPITAL_BASED_OUTPATIENT_CLINIC_OR_DEPARTMENT_OTHER): Payer: Self-pay

## 2018-09-15 ENCOUNTER — Emergency Department (HOSPITAL_BASED_OUTPATIENT_CLINIC_OR_DEPARTMENT_OTHER)
Admission: EM | Admit: 2018-09-15 | Discharge: 2018-09-15 | Disposition: A | Discharge status: Home / Self Care | Payer: Self-pay | Attending: Emergency Medicine | Admitting: Emergency Medicine

## 2018-09-15 ENCOUNTER — Other Ambulatory Visit: Payer: Self-pay

## 2018-09-15 DIAGNOSIS — J4521 Mild intermittent asthma with (acute) exacerbation: Secondary | ICD-10-CM | POA: Insufficient documentation

## 2018-09-15 DIAGNOSIS — I10 Essential (primary) hypertension: Secondary | ICD-10-CM | POA: Insufficient documentation

## 2018-09-15 DIAGNOSIS — E039 Hypothyroidism, unspecified: Secondary | ICD-10-CM | POA: Insufficient documentation

## 2018-09-15 DIAGNOSIS — Z87891 Personal history of nicotine dependence: Secondary | ICD-10-CM | POA: Insufficient documentation

## 2018-09-15 LAB — CBC WITH DIFFERENTIAL/PLATELET
Basophils Absolute: 0 10*3/uL (ref 0.0–0.1)
Basophils Relative: 0 %
EOS ABS: 0.3 10*3/uL (ref 0.0–0.7)
EOS PCT: 7 %
HCT: 33.1 % — ABNORMAL LOW (ref 36.0–46.0)
Hemoglobin: 10.9 g/dL — ABNORMAL LOW (ref 12.0–15.0)
LYMPHS ABS: 1.9 10*3/uL (ref 0.7–4.0)
LYMPHS PCT: 37 %
MCH: 28.8 pg (ref 26.0–34.0)
MCHC: 32.9 g/dL (ref 30.0–36.0)
MCV: 87.6 fL (ref 78.0–100.0)
Monocytes Absolute: 0.3 10*3/uL (ref 0.1–1.0)
Monocytes Relative: 5 %
Neutro Abs: 2.6 10*3/uL (ref 1.7–7.7)
Neutrophils Relative %: 51 %
PLATELETS: 355 10*3/uL (ref 150–400)
RBC: 3.78 MIL/uL — AB (ref 3.87–5.11)
RDW: 13.5 % (ref 11.5–15.5)
WBC: 5.1 10*3/uL (ref 4.0–10.5)

## 2018-09-15 LAB — COMPREHENSIVE METABOLIC PANEL
ALK PHOS: 60 U/L (ref 38–126)
ALT: 10 U/L (ref 0–44)
ANION GAP: 8 (ref 5–15)
AST: 15 U/L (ref 15–41)
Albumin: 3.6 g/dL (ref 3.5–5.0)
BUN: 7 mg/dL (ref 6–20)
CALCIUM: 8.6 mg/dL — AB (ref 8.9–10.3)
CHLORIDE: 106 mmol/L (ref 98–111)
CO2: 25 mmol/L (ref 22–32)
CREATININE: 0.8 mg/dL (ref 0.44–1.00)
Glucose, Bld: 99 mg/dL (ref 70–99)
Potassium: 3.2 mmol/L — ABNORMAL LOW (ref 3.5–5.1)
SODIUM: 139 mmol/L (ref 135–145)
Total Bilirubin: 0.3 mg/dL (ref 0.3–1.2)
Total Protein: 6.8 g/dL (ref 6.5–8.1)

## 2018-09-15 LAB — TROPONIN I

## 2018-09-15 MED ORDER — METHYLPREDNISOLONE SODIUM SUCC 125 MG IJ SOLR
125.0000 mg | Freq: Once | INTRAMUSCULAR | Status: AC
  Administered 2018-09-15: 125 mg via INTRAVENOUS
  Filled 2018-09-15: qty 2

## 2018-09-15 MED ORDER — ALBUTEROL SULFATE HFA 108 (90 BASE) MCG/ACT IN AERS
2.0000 | INHALATION_SPRAY | Freq: Once | RESPIRATORY_TRACT | Status: AC
  Administered 2018-09-15: 2 via RESPIRATORY_TRACT
  Filled 2018-09-15: qty 6.7

## 2018-09-15 MED ORDER — PROCHLORPERAZINE EDISYLATE 10 MG/2ML IJ SOLN
10.0000 mg | Freq: Once | INTRAMUSCULAR | Status: AC
  Administered 2018-09-15: 10 mg via INTRAVENOUS
  Filled 2018-09-15: qty 2

## 2018-09-15 MED ORDER — ALBUTEROL (5 MG/ML) CONTINUOUS INHALATION SOLN
15.0000 mg/h | INHALATION_SOLUTION | Freq: Once | RESPIRATORY_TRACT | Status: AC
  Administered 2018-09-15: 15 mg/h via RESPIRATORY_TRACT
  Filled 2018-09-15: qty 20

## 2018-09-15 MED ORDER — POTASSIUM CHLORIDE CRYS ER 20 MEQ PO TBCR
40.0000 meq | EXTENDED_RELEASE_TABLET | Freq: Once | ORAL | Status: AC
  Administered 2018-09-15: 40 meq via ORAL
  Filled 2018-09-15: qty 2

## 2018-09-15 MED ORDER — ACETAMINOPHEN 500 MG PO TABS
1000.0000 mg | ORAL_TABLET | Freq: Once | ORAL | Status: AC
  Administered 2018-09-15: 1000 mg via ORAL
  Filled 2018-09-15: qty 2

## 2018-09-15 MED ORDER — PREDNISONE 10 MG PO TABS: 40.0000 mg | ORAL_TABLET | Freq: Every day | ORAL | 0 refills | Status: AC

## 2018-09-15 MED ORDER — CYCLOBENZAPRINE HCL 10 MG PO TABS
10.0000 mg | ORAL_TABLET | Freq: Once | ORAL | Status: AC
  Administered 2018-09-15: 10 mg via ORAL
  Filled 2018-09-15: qty 1

## 2018-09-15 MED ORDER — ALBUTEROL SULFATE (2.5 MG/3ML) 0.083% IN NEBU
INHALATION_SOLUTION | RESPIRATORY_TRACT | Status: AC
  Administered 2018-09-15: 2.5 mg via RESPIRATORY_TRACT
  Filled 2018-09-15: qty 3

## 2018-09-15 MED ORDER — DIPHENHYDRAMINE HCL 50 MG/ML IJ SOLN
25.0000 mg | Freq: Once | INTRAMUSCULAR | Status: AC
  Administered 2018-09-15: 25 mg via INTRAVENOUS
  Filled 2018-09-15: qty 1

## 2018-09-15 MED ORDER — IPRATROPIUM BROMIDE 0.02 % IN SOLN
1.0000 mg | Freq: Once | RESPIRATORY_TRACT | Status: AC
  Administered 2018-09-15: 1 mg via RESPIRATORY_TRACT
  Filled 2018-09-15: qty 5

## 2018-09-15 MED ORDER — ALBUTEROL SULFATE (2.5 MG/3ML) 0.083% IN NEBU
2.5000 mg | INHALATION_SOLUTION | Freq: Once | RESPIRATORY_TRACT | Status: AC
  Administered 2018-09-15: 2.5 mg via RESPIRATORY_TRACT

## 2018-09-15 MED ORDER — MAGNESIUM SULFATE 2 GM/50ML IV SOLN
2.0000 g | Freq: Once | INTRAVENOUS | Status: AC
  Administered 2018-09-15: 2 g via INTRAVENOUS
  Filled 2018-09-15: qty 50

## 2018-09-15 MED ORDER — FLUTICASONE PROPIONATE HFA 44 MCG/ACT IN AERO
2.0000 | INHALATION_SPRAY | Freq: Once | RESPIRATORY_TRACT | Status: AC
  Administered 2018-09-15: 2 via RESPIRATORY_TRACT
  Filled 2018-09-15: qty 10.6

## 2018-09-15 MED ORDER — IPRATROPIUM-ALBUTEROL 0.5-2.5 (3) MG/3ML IN SOLN
3.0000 mL | Freq: Once | RESPIRATORY_TRACT | Status: AC
  Administered 2018-09-15: 3 mL via RESPIRATORY_TRACT

## 2018-09-15 MED ORDER — IPRATROPIUM-ALBUTEROL 0.5-2.5 (3) MG/3ML IN SOLN
RESPIRATORY_TRACT | Status: AC
  Administered 2018-09-15: 3 mL via RESPIRATORY_TRACT
  Filled 2018-09-15: qty 3

## 2018-09-15 NOTE — ED Triage Notes (Signed)
Asthma attack over an hour ago. Wheezing.

## 2018-09-15 NOTE — ED Provider Notes (Signed)
Pt still well appearing and wheezing controlled.  Sent home with albuterol and ppx.   Blanchie Dessert, MD 09/15/18 [phone]

## 2018-09-15 NOTE — ED Notes (Signed)
BiPap at bedside on standby

## 2018-09-15 NOTE — ED Provider Notes (Signed)
[facility] EMERGENCY DEPARTMENT Provider Note   CSN: [id number] Arrival date & time: 09/15/18  1257     History   Chief Complaint No chief complaint on file.   HPI Catlyn Shipton is a 52 y.o. female.  HPI   Initial history limited by acuity of condition, pt unable to speak due to dyspnea  On reeval  hx obtained--  2 weeks ago started coughing, then one week ago started having shortness of breath/wheezing, Initially was improving with using nebulizers, was using every 3 hours but then worsened.  No fevers. Cough nonproductive. No hx of DVT/PE, no OCPs, no long trips car or airplane, no recent hospitalizations, no surgeries No rhinnorhea, has had congestion Has had symptoms of seasonal allergies, eye watering   Past Medical History:  Diagnosis Date  . Anemia   . Anxiety   . Arthritis   . Asthma   . Cancer ([place])    cervic  . H/O: hysterectomy   . High cholesterol   . Hypertension   . Hypothyroidism   . Insomnia   . Medical history non-contributory   . Migraine   . Morbid obesity ([place])   . Shortness of breath   . Stroke ([place])   . Thyroid disease     Patient Active Problem List   Diagnosis Date Noted  . Snoring 05/03/2016  . Morbid obesity ([place]) 04/25/2014  . Asthma exacerbation 04/22/2014  . Asthma attack 04/22/2014  . Asthma with acute exacerbation 11/03/2013  . Unspecified hypothyroidism 11/03/2013  . Tobacco abuse 11/03/2013  . Migraine 09/22/2012  . Weakness of right side of body 09/22/2012  . Thyroid disease   . Hypertension   . High cholesterol   . H/O: hysterectomy   . ASTHMA 11/26/2008  . HEADACHE, CHRONIC 11/26/2008    Past Surgical History:  Procedure Laterality Date  . ABDOMINAL HYSTERECTOMY    . CESAREAN SECTION     3 occasions  . CHOLECYSTECTOMY    . THYROIDECTOMY, PARTIAL     Goiter     OB History   None      Home Medications    Prior to Admission medications   Medication Sig Start Date End Date Taking?  Authorizing Provider  albuterol (PROVENTIL) (2.5 MG/3ML) 0.083% nebulizer solution Take 3 mLs (2.5 mg total) by nebulization every 4 (four) hours as needed for wheezing or shortness of breath. 12/01/17   Pisciotta, Elmyra Ricks, PA-C  budesonide-formoterol (SYMBICORT) 160-4.5 MCG/ACT inhaler Inhale 2 puffs into the lungs 2 (two) times daily.    [provider]  doxycycline (VIBRAMYCIN) 100 MG capsule Take 1 capsule (100 mg total) by mouth 2 (two) times daily. 12/29/17   Petrucelli, Samantha R, PA-C  fluticasone (FLONASE) 50 MCG/ACT nasal spray Place 1 spray into both nostrils daily. 12/29/17   Petrucelli, Samantha R, PA-C  Ibuprofen-Diphenhydramine HCl (ADVIL PM) 200-25 MG CAPS Take 1-2 tablets by mouth at bedtime as needed (sleep).    [provider]  predniSONE (DELTASONE) 10 MG tablet Take 4 tablets (40 mg total) by mouth daily for 4 days. 09/15/18 09/19/18  Gareth Morgan, MD    Family History Family History  Problem Relation Age of Onset  . Migraines Sister     Social History Social History   Tobacco Use  . Smoking status: Former Smoker    Types: Cigarettes  . Smokeless tobacco: Never Used  Substance Use Topics  . Alcohol use: No  . Drug use: No     Allergies   Blueberry [vaccinium  angustifolium]; Mango flavor; Aspirin; and Penicillins   Review of Systems Review of Systems  Constitutional: Negative for fever.  HENT: Negative for sore throat.   Eyes: Negative for visual disturbance.  Respiratory: Positive for cough, chest tightness, shortness of breath and wheezing.   Cardiovascular: Negative for chest pain.  Gastrointestinal: Negative for abdominal pain, nausea and vomiting.  Genitourinary: Negative for difficulty urinating and dysuria.  Musculoskeletal: Positive for back pain. Negative for neck pain.  Skin: Negative for rash.  Neurological: Positive for headaches. Negative for syncope.     Physical Exam Updated Vital Signs BP 136/72   Pulse (!) 101    Temp 98.9 F (37.2 C) (Oral)   Resp 18   Ht 5\' 4"  (1.626 m)   Wt 127 kg   SpO2 100%   BMI 48.06 kg/m   Physical Exam  Constitutional: She is oriented to person, place, and time. She appears well-developed and well-nourished. No distress.  HENT:  Head: Normocephalic and atraumatic.  Eyes: Conjunctivae and EOM are normal.  Neck: Normal range of motion.  Cardiovascular: Normal rate, regular rhythm, normal heart sounds and intact distal pulses. Exam reveals no gallop and no friction rub.  No murmur heard. Pulmonary/Chest: Tachypnea noted. She is in respiratory distress. She has wheezes. She has no rales.  Abdominal: Soft. She exhibits no distension. There is no tenderness. There is no guarding.  Musculoskeletal: She exhibits no edema or tenderness.  Neurological: She is alert and oriented to person, place, and time.  Skin: Skin is warm and dry. No rash noted. She is not diaphoretic. No erythema.  Nursing note and vitals reviewed.    ED Treatments / Results  Labs (all labs ordered are listed, but only abnormal results are displayed) Labs Reviewed  CBC WITH DIFFERENTIAL/PLATELET - Abnormal; Notable for the following components:      Result Value   RBC 3.78 (*)    Hemoglobin 10.9 (*)    HCT 33.1 (*)    All other components within normal limits  COMPREHENSIVE METABOLIC PANEL - Abnormal; Notable for the following components:   Potassium 3.2 (*)    Calcium 8.6 (*)    All other components within normal limits  TROPONIN I    EKG EKG Interpretation  Date/Time:  Friday September 15 2018 13:23:26 EDT Ventricular Rate:  88 PR Interval:    QRS Duration: 89 QT Interval:  396 QTC Calculation: 480 R Axis:   18 Text Interpretation:  Sinus rhythm Low voltage, precordial leads Abnormal R-wave progression, early transition Borderline T abnormalities, diffuse leads No significant change since last tracing Confirmed by Gareth Morgan [id number]) on 09/15/2018 1:47:57 PM   Radiology Dg Chest  Portable 1 View  Result Date: 09/15/2018 CLINICAL DATA:  Asthma attack for 1 hour. EXAM: PORTABLE CHEST 1 VIEW COMPARISON:  Chest radiograph December 29, 2017 FINDINGS: Cardiomediastinal silhouette is unremarkable for this low inspiratory examination with crowded vasculature markings. Mild bronchitic changes peer the lungs are clear without pleural effusions or focal consolidations. Trachea projects midline and there is no pneumothorax. Included soft tissue planes and osseous structures are non-suspicious. Mild degenerative change of the thoracic spine. IMPRESSION: Mild bronchitic changes without focal consolidation. Electronically Signed   By: Elon Alas M.D.   On: 09/15/2018 13:34    Procedures Procedures (including critical care time)  Medications Ordered in ED Medications  methylPREDNISolone sodium succinate (SOLU-MEDROL) 125 mg/2 mL injection 125 mg (125 mg Intravenous Given 09/15/18 1410)  magnesium sulfate IVPB 2 g 50 mL (0 g  Intravenous Stopped 09/15/18 1522)  albuterol (PROVENTIL,VENTOLIN) solution continuous neb (15 mg/hr Nebulization Given 09/15/18 1330)  ipratropium (ATROVENT) nebulizer solution 1 mg (1 mg Nebulization Given 09/15/18 1330)  ipratropium-albuterol (DUONEB) 0.5-2.5 (3) MG/3ML nebulizer solution 3 mL (3 mLs Nebulization Given 09/15/18 1310)  albuterol (PROVENTIL) (2.5 MG/3ML) 0.083% nebulizer solution 2.5 mg (2.5 mg Nebulization Given 09/15/18 1310)  cyclobenzaprine (FLEXERIL) tablet 10 mg (10 mg Oral Given 09/15/18 1522)  acetaminophen (TYLENOL) tablet 1,000 mg (1,000 mg Oral Given 09/15/18 1522)  potassium chloride SA (K-DUR,KLOR-CON) CR tablet 40 mEq (40 mEq Oral Given 09/15/18 1609)  fluticasone (FLOVENT HFA) 44 MCG/ACT inhaler 2 puff (2 puffs Inhalation Given 09/15/18 1706)  albuterol (PROVENTIL HFA;VENTOLIN HFA) 108 (90 Base) MCG/ACT inhaler 2 puff (2 puffs Inhalation Given 09/15/18 1706)  diphenhydrAMINE (BENADRYL) injection 25 mg (25 mg Intravenous Given 09/15/18 1643)   prochlorperazine (COMPAZINE) injection 10 mg (10 mg Intravenous Given 09/15/18 1644)     Initial Impression / Assessment and Plan / ED Course  I have reviewed the triage vital signs and the nursing notes.  Pertinent labs & imaging results that were available during my care of the patient were reviewed by me and considered in my medical decision making (see chart for details).    52 year old female with history above history of asthma presents with concern for shortness of breath, cough and wheezing.  On arrival to the emergency department, she is in respiratory distress, unable to speak, with tachypnea and diffuse wheezing on exam.  She was placed on BiPAP and given continuous albuterol, Atrovent and given IV magnesium and Solu-Medrol.  Chest x-ray done shows no evidence of pneumonia or pneumothorax.  No evident evidence of congestive heart failure.  Suspect exacerbation secondary to allergies or URI.  Initial history is limited by patient's respiratory distress, however she was able to provide further history and reevaluation.  On reevaluation, patient is significant improvements, she was taken off of BiPAP initially continued on albuterol.  She continued improvement, and was taken off of continuous albuterol.  She is comfortable, talking on the phone, able to ambulate to the bathroom.  Given the severity of initial presentation, will observe off of continuous.  If she remains stable, will plan on discharge with prednisone. Given flovent and albuterol inhalers in  ED.   Final Clinical Impressions(s) / ED Diagnoses   Final diagnoses:  Mild intermittent asthma with acute exacerbation    ED Discharge Orders         Ordered    predniSONE (DELTASONE) 10 MG tablet  Daily     09/15/18 1620           Gareth Morgan, MD 09/15/18 1745

## 2018-09-15 NOTE — ED Notes (Signed)
Off CAT NEB, speaking in complete sentences, HR 104, RR 16, SpO2 95-100% on r/a, BBS decreased with scatter wheezes.

## 2018-12-18 ENCOUNTER — Emergency Department (HOSPITAL_BASED_OUTPATIENT_CLINIC_OR_DEPARTMENT_OTHER)
Admission: EM | Admit: 2018-12-18 | Discharge: 2018-12-18 | Disposition: A | Discharge status: Home / Self Care | Payer: Self-pay | Attending: Emergency Medicine | Admitting: Emergency Medicine

## 2018-12-18 ENCOUNTER — Other Ambulatory Visit: Payer: Self-pay

## 2018-12-18 ENCOUNTER — Encounter (HOSPITAL_BASED_OUTPATIENT_CLINIC_OR_DEPARTMENT_OTHER): Payer: Self-pay

## 2018-12-18 DIAGNOSIS — I10 Essential (primary) hypertension: Secondary | ICD-10-CM | POA: Insufficient documentation

## 2018-12-18 DIAGNOSIS — J4541 Moderate persistent asthma with (acute) exacerbation: Secondary | ICD-10-CM | POA: Insufficient documentation

## 2018-12-18 DIAGNOSIS — E039 Hypothyroidism, unspecified: Secondary | ICD-10-CM | POA: Insufficient documentation

## 2018-12-18 DIAGNOSIS — Z79899 Other long term (current) drug therapy: Secondary | ICD-10-CM | POA: Insufficient documentation

## 2018-12-18 DIAGNOSIS — Z87891 Personal history of nicotine dependence: Secondary | ICD-10-CM | POA: Insufficient documentation

## 2018-12-18 DIAGNOSIS — Z8541 Personal history of malignant neoplasm of cervix uteri: Secondary | ICD-10-CM | POA: Insufficient documentation

## 2018-12-18 DIAGNOSIS — Z8673 Personal history of transient ischemic attack (TIA), and cerebral infarction without residual deficits: Secondary | ICD-10-CM | POA: Insufficient documentation

## 2018-12-18 DIAGNOSIS — F419 Anxiety disorder, unspecified: Secondary | ICD-10-CM | POA: Insufficient documentation

## 2018-12-18 DIAGNOSIS — Z9049 Acquired absence of other specified parts of digestive tract: Secondary | ICD-10-CM | POA: Insufficient documentation

## 2018-12-18 LAB — CBC WITH DIFFERENTIAL/PLATELET
Abs Immature Granulocytes: 0.02 10*3/uL (ref 0.00–0.07)
Basophils Absolute: 0 10*3/uL (ref 0.0–0.1)
Basophils Relative: 1 %
EOS PCT: 5 %
Eosinophils Absolute: 0.4 10*3/uL (ref 0.0–0.5)
HCT: 37.8 % (ref 36.0–46.0)
Hemoglobin: 11.8 g/dL — ABNORMAL LOW (ref 12.0–15.0)
Immature Granulocytes: 0 %
LYMPHS PCT: 14 %
Lymphs Abs: 1 10*3/uL (ref 0.7–4.0)
MCH: 26.9 pg (ref 26.0–34.0)
MCHC: 31.2 g/dL (ref 30.0–36.0)
MCV: 86.1 fL (ref 80.0–100.0)
MONO ABS: 0.2 10*3/uL (ref 0.1–1.0)
Monocytes Relative: 3 %
Neutro Abs: 5.5 10*3/uL (ref 1.7–7.7)
Neutrophils Relative %: 77 %
Platelets: 301 10*3/uL (ref 150–400)
RBC: 4.39 MIL/uL (ref 3.87–5.11)
RDW: 15.5 % (ref 11.5–15.5)
WBC: 7.2 10*3/uL (ref 4.0–10.5)
nRBC: 0 % (ref 0.0–0.2)

## 2018-12-18 LAB — BASIC METABOLIC PANEL
Anion gap: 8 (ref 5–15)
BUN: 10 mg/dL (ref 6–20)
CHLORIDE: 108 mmol/L (ref 98–111)
CO2: 24 mmol/L (ref 22–32)
CREATININE: 0.62 mg/dL (ref 0.44–1.00)
Calcium: 8.7 mg/dL — ABNORMAL LOW (ref 8.9–10.3)
GFR calc Af Amer: >60 mL/min (ref >60)
Glucose, Bld: 127 mg/dL — ABNORMAL HIGH (ref 70–99)
Potassium: 3.2 mmol/L — ABNORMAL LOW (ref 3.5–5.1)
SODIUM: 140 mmol/L (ref 135–145)

## 2018-12-18 MED ORDER — IPRATROPIUM-ALBUTEROL 0.5-2.5 (3) MG/3ML IN SOLN
RESPIRATORY_TRACT | Status: AC
  Administered 2018-12-18: 3 mL via RESPIRATORY_TRACT
  Filled 2018-12-18: qty 3

## 2018-12-18 MED ORDER — ALBUTEROL (5 MG/ML) CONTINUOUS INHALATION SOLN
INHALATION_SOLUTION | RESPIRATORY_TRACT | Status: AC
  Administered 2018-12-18: 15 mg/h via RESPIRATORY_TRACT
  Filled 2018-12-18: qty 20

## 2018-12-18 MED ORDER — IPRATROPIUM-ALBUTEROL 0.5-2.5 (3) MG/3ML IN SOLN
3.0000 mL | Freq: Once | RESPIRATORY_TRACT | Status: AC
  Administered 2018-12-18: 3 mL via RESPIRATORY_TRACT

## 2018-12-18 MED ORDER — MAGNESIUM SULFATE 2 GM/50ML IV SOLN
2.0000 g | Freq: Once | INTRAVENOUS | Status: AC
  Administered 2018-12-18: 2 g via INTRAVENOUS
  Filled 2018-12-18: qty 50

## 2018-12-18 MED ORDER — ALBUTEROL SULFATE HFA 108 (90 BASE) MCG/ACT IN AERS
2.0000 | INHALATION_SPRAY | RESPIRATORY_TRACT | Status: DC | PRN
  Administered 2018-12-18: 2 via RESPIRATORY_TRACT
  Filled 2018-12-18: qty 6.7

## 2018-12-18 MED ORDER — MORPHINE SULFATE (PF) 4 MG/ML IV SOLN
4.0000 mg | Freq: Once | INTRAVENOUS | Status: AC
  Administered 2018-12-18: 4 mg via INTRAVENOUS
  Filled 2018-12-18: qty 1

## 2018-12-18 MED ORDER — PENTAFLUOROPROP-TETRAFLUOROETH EX AERO
INHALATION_SPRAY | CUTANEOUS | Status: AC
  Administered 2018-12-18: 30
  Filled 2018-12-18: qty 30

## 2018-12-18 MED ORDER — IPRATROPIUM BROMIDE 0.02 % IN SOLN
RESPIRATORY_TRACT | Status: AC
  Administered 2018-12-18: 0.5 mg via RESPIRATORY_TRACT
  Filled 2018-12-18: qty 5

## 2018-12-18 MED ORDER — ALBUTEROL (5 MG/ML) CONTINUOUS INHALATION SOLN
10.0000 mg/h | INHALATION_SOLUTION | Freq: Once | RESPIRATORY_TRACT | Status: AC
  Administered 2018-12-18: 10 mg/h via RESPIRATORY_TRACT

## 2018-12-18 MED ORDER — IPRATROPIUM BROMIDE 0.02 % IN SOLN
0.5000 mg | Freq: Once | RESPIRATORY_TRACT | Status: AC
  Administered 2018-12-18: 0.5 mg via RESPIRATORY_TRACT

## 2018-12-18 MED ORDER — ALBUTEROL SULFATE (2.5 MG/3ML) 0.083% IN NEBU: 2.5000 mg | INHALATION_SOLUTION | RESPIRATORY_TRACT | 2 refills | Status: DC | PRN

## 2018-12-18 MED ORDER — ALBUTEROL (5 MG/ML) CONTINUOUS INHALATION SOLN
15.0000 mg/h | INHALATION_SOLUTION | Freq: Once | RESPIRATORY_TRACT | Status: AC
  Administered 2018-12-18: 15 mg/h via RESPIRATORY_TRACT

## 2018-12-18 MED ORDER — ONDANSETRON HCL 4 MG/2ML IJ SOLN
4.0000 mg | Freq: Once | INTRAMUSCULAR | Status: AC
  Administered 2018-12-18: 4 mg via INTRAVENOUS
  Filled 2018-12-18: qty 2

## 2018-12-18 MED ORDER — PREDNISONE 20 MG PO TABS: 40.0000 mg | ORAL_TABLET | Freq: Every day | ORAL | 0 refills | Status: DC

## 2018-12-18 MED ORDER — ALBUTEROL SULFATE (2.5 MG/3ML) 0.083% IN NEBU
INHALATION_SOLUTION | RESPIRATORY_TRACT | Status: AC
  Administered 2018-12-18: 2.5 mg via RESPIRATORY_TRACT
  Filled 2018-12-18: qty 3

## 2018-12-18 MED ORDER — METHYLPREDNISOLONE SODIUM SUCC 125 MG IJ SOLR
125.0000 mg | Freq: Once | INTRAMUSCULAR | Status: AC
  Administered 2018-12-18: 125 mg via INTRAVENOUS
  Filled 2018-12-18: qty 2

## 2018-12-18 MED ORDER — ALBUTEROL SULFATE (2.5 MG/3ML) 0.083% IN NEBU
2.5000 mg | INHALATION_SOLUTION | Freq: Once | RESPIRATORY_TRACT | Status: AC
  Administered 2018-12-18: 2.5 mg via RESPIRATORY_TRACT

## 2018-12-18 MED FILL — predniSONE 20 MG TABS: 20 | 12 days supply | Qty: 15 | Fill #0

## 2018-12-18 NOTE — ED Notes (Signed)
C/o sob and wheezing x 1 week,  Also back and bilateral rib pain

## 2018-12-18 NOTE — ED Provider Notes (Signed)
[facility] Provider Note   CSN: [id number] Arrival date & time: 12/18/18  1141     History   Chief Complaint Chief Complaint  Patient presents with  . Wheezing    HPI Wendy Hoffman is a 53 y.o. female.  The history is provided by the patient.  Wheezing   This is a recurrent problem. Episode onset: 1 week ago. The problem occurs constantly. The problem has been gradually worsening. Associated symptoms include chest pain and cough. Pertinent negatives include no fever, no abdominal pain, no vomiting, no dysuria, no ear pain, no headaches, no rhinorrhea and no sputum production. It is unknown what precipitated the problem. She has tried beta-agonist inhalers for the symptoms. The treatment provided no relief. She has had prior hospitalizations. She has had prior ED visits. Her past medical history is significant for asthma.    Past Medical History:  Diagnosis Date  . Anemia   . Anxiety   . Arthritis   . Asthma   . Cancer ([place])    cervic  . H/O: hysterectomy   . High cholesterol   . Hypertension   . Hypothyroidism   . Insomnia   . Medical history non-contributory   . Migraine   . Morbid obesity ([place])   . Shortness of breath   . Stroke ([place])   . Thyroid disease     Patient Active Problem List   Diagnosis Date Noted  . Snoring 05/03/2016  . Morbid obesity ([place]) 04/25/2014  . Asthma exacerbation 04/22/2014  . Asthma attack 04/22/2014  . Asthma with acute exacerbation 11/03/2013  . Unspecified hypothyroidism 11/03/2013  . Tobacco abuse 11/03/2013  . Migraine 09/22/2012  . Weakness of right side of body 09/22/2012  . Thyroid disease   . Hypertension   . High cholesterol   . H/O: hysterectomy   . ASTHMA 11/26/2008  . HEADACHE, CHRONIC 11/26/2008    Past Surgical History:  Procedure Laterality Date  . ABDOMINAL HYSTERECTOMY    . CESAREAN SECTION     3 occasions  . CHOLECYSTECTOMY    . THYROIDECTOMY, PARTIAL     Goiter      OB History   No obstetric history on file.      Home Medications    Prior to Admission medications   Medication Sig Start Date End Date Taking? Authorizing Provider  albuterol (PROVENTIL) (2.5 MG/3ML) 0.083% nebulizer solution Take 3 mLs (2.5 mg total) by nebulization every 4 (four) hours as needed for wheezing or shortness of breath. 12/01/17   Pisciotta, Elmyra Ricks, PA-C  budesonide-formoterol (SYMBICORT) 160-4.5 MCG/ACT inhaler Inhale 2 puffs into the lungs 2 (two) times daily.    [provider]  doxycycline (VIBRAMYCIN) 100 MG capsule Take 1 capsule (100 mg total) by mouth 2 (two) times daily. 12/29/17   Petrucelli, Samantha R, PA-C  fluticasone (FLONASE) 50 MCG/ACT nasal spray Place 1 spray into both nostrils daily. 12/29/17   Petrucelli, Samantha R, PA-C  Ibuprofen-Diphenhydramine HCl (ADVIL PM) 200-25 MG CAPS Take 1-2 tablets by mouth at bedtime as needed (sleep).    [provider]    Family History Family History  Problem Relation Age of Onset  . Migraines Sister     Social History Social History   Tobacco Use  . Smoking status: Former Smoker    Types: Cigarettes  . Smokeless tobacco: Never Used  Substance Use Topics  . Alcohol use: No  . Drug use: No     Allergies   Blueberry [vaccinium angustifolium];  Mango flavor; Aspirin; and Penicillins   Review of Systems Review of Systems  Constitutional: Negative for fever.  HENT: Negative for ear pain and rhinorrhea.   Respiratory: Positive for cough and wheezing. Negative for sputum production.   Cardiovascular: Positive for chest pain.  Gastrointestinal: Negative for abdominal pain and vomiting.  Genitourinary: Negative for dysuria.  Neurological: Negative for headaches.  All other systems reviewed and are negative.    Physical Exam Updated Vital Signs BP (!) 161/115 (BP Location: Left Arm)   Pulse 100   Temp 98 F (36.7 C) (Oral)   Resp 18   Ht 5\' 4"  (1.626 m)   Wt 129.3 kg   SpO2  96%   BMI 48.92 kg/m   Physical Exam Vitals signs and nursing note reviewed.  Constitutional:      General: She is not in acute distress.    Appearance: She is well-developed.  HENT:     Head: Normocephalic and atraumatic.     Mouth/Throat:     Comments: Poor dentition Eyes:     Pupils: Pupils are equal, round, and reactive to light.  Cardiovascular:     Rate and Rhythm: Regular rhythm. Tachycardia present.     Heart sounds: Normal heart sounds. No murmur. No friction rub.  Pulmonary:     Effort: Pulmonary effort is normal. Tachypnea present. No accessory muscle usage.     Breath sounds: Wheezing present. No rales.  Abdominal:     General: Bowel sounds are normal. There is no distension.     Palpations: Abdomen is soft.     Tenderness: There is no abdominal tenderness. There is no guarding or rebound.  Musculoskeletal: Normal range of motion.        General: No tenderness.     Comments: No edema  Skin:    General: Skin is warm and dry.     Findings: No rash.  Neurological:     Mental Status: She is alert and oriented to person, place, and time.     Cranial Nerves: No cranial nerve deficit.  Psychiatric:        Behavior: Behavior normal.      ED Treatments / Results  Labs (all labs ordered are listed, but only abnormal results are displayed) Labs Reviewed  BASIC METABOLIC PANEL - Abnormal; Notable for the following components:      Result Value   Potassium 3.2 (*)    Glucose, Bld 127 (*)    Calcium 8.7 (*)    All other components within normal limits  CBC WITH DIFFERENTIAL/PLATELET - Abnormal; Notable for the following components:   Hemoglobin 11.8 (*)    All other components within normal limits    EKG EKG Interpretation  Date/Time:  Monday December 18 2018 13:25:28 EST Ventricular Rate:  97 PR Interval:    QRS Duration: 99 QT Interval:  366 QTC Calculation: 465 R Axis:   14 Text Interpretation:  Sinus rhythm Borderline T abnormalities, anterior leads  Baseline wander in lead(s) V2 No significant change since last tracing Confirmed by Blanchie Dessert [phone]) on 12/18/2018 1:55:24 PM   Radiology No results found.  Procedures Procedures (including critical care time)  Medications Ordered in ED Medications  methylPREDNISolone sodium succinate (SOLU-MEDROL) 125 mg/2 mL injection 125 mg (has no administration in time range)  ondansetron (ZOFRAN) injection 4 mg (has no administration in time range)  morphine 4 MG/ML injection 4 mg (has no administration in time range)  ipratropium (ATROVENT) nebulizer solution 0.5 mg (has no administration  in time range)  albuterol (PROVENTIL,VENTOLIN) solution continuous neb (has no administration in time range)  magnesium sulfate IVPB 2 g 50 mL (has no administration in time range)  albuterol (PROVENTIL) (2.5 MG/3ML) 0.083% nebulizer solution (2.5 mg  Given 12/18/18 1156)  ipratropium-albuterol (DUONEB) 0.5-2.5 (3) MG/3ML nebulizer solution (3 mLs  Given 12/18/18 1156)     Initial Impression / Assessment and Plan / ED Course  I have reviewed the triage vital signs and the nursing notes.  Pertinent labs & imaging results that were available during my care of the patient were reviewed by me and considered in my medical decision making (see chart for details).     Pt with typical asthma exacerbation  symptoms.  No infectious sx, productive cough or other complaints.  Wheezing on exam.  will give steroids, mag and CAT albuterol/atrovent and recheck.  Pt received 1 neb while in the waiting room without improvement in sx. patient states this happens every few months.  She does not take long-acting steroid inhalers.  3:36 PM After a 2-hour cat, magnesium and Solu-Medrol patient now has minimal wheezing and is feeling much better.  Patient feels that she is well enough to go home.  Patient will be given an inhaler, prescriptions for her nebs and steroids. Labs and EKG without acute findings.  CRITICAL  CARE Performed by: Della Scrivener Total critical care time: 30 minutes Critical care time was exclusive of separately billable procedures and treating other patients. Critical care was necessary to treat or prevent imminent or life-threatening deterioration. Critical care was time spent personally by me on the following activities: development of treatment plan with patient and/or surrogate as well as nursing, discussions with consultants, evaluation of patient's response to treatment, examination of patient, obtaining history from patient or surrogate, ordering and performing treatments and interventions, ordering and review of laboratory studies, ordering and review of radiographic studies, pulse oximetry and re-evaluation of patient's condition.   Final Clinical Impressions(s) / ED Diagnoses   Final diagnoses:  Moderate persistent asthma with exacerbation    ED Discharge Orders         Ordered    albuterol (PROVENTIL) (2.5 MG/3ML) 0.083% nebulizer solution  Every 4 hours PRN     12/18/18 1539    predniSONE (DELTASONE) 20 MG tablet  Daily     12/18/18 1539           Blanchie Dessert, MD 12/18/18 1539

## 2018-12-18 NOTE — ED Triage Notes (Addendum)
Pt c/o cough,wheezing-entered triage with audible wheezing-NAD-last albuterol neb at 8am

## 2018-12-18 NOTE — ED Notes (Signed)
Pt has been on monitor

## 2018-12-18 NOTE — Discharge Instructions (Signed)
Return to the emergency room if your symptoms worsen.

## 2019-02-18 ENCOUNTER — Emergency Department (HOSPITAL_BASED_OUTPATIENT_CLINIC_OR_DEPARTMENT_OTHER)
Admission: EM | Admit: 2019-02-18 | Discharge: 2019-02-18 | Disposition: A | Discharge status: Home / Self Care | Payer: Self-pay | Attending: Emergency Medicine | Admitting: Emergency Medicine

## 2019-02-18 ENCOUNTER — Other Ambulatory Visit: Payer: Self-pay

## 2019-02-18 ENCOUNTER — Encounter (HOSPITAL_BASED_OUTPATIENT_CLINIC_OR_DEPARTMENT_OTHER): Payer: Self-pay | Admitting: Emergency Medicine

## 2019-02-18 DIAGNOSIS — Z87891 Personal history of nicotine dependence: Secondary | ICD-10-CM | POA: Insufficient documentation

## 2019-02-18 DIAGNOSIS — Z79899 Other long term (current) drug therapy: Secondary | ICD-10-CM | POA: Insufficient documentation

## 2019-02-18 DIAGNOSIS — I10 Essential (primary) hypertension: Secondary | ICD-10-CM | POA: Insufficient documentation

## 2019-02-18 DIAGNOSIS — E039 Hypothyroidism, unspecified: Secondary | ICD-10-CM | POA: Insufficient documentation

## 2019-02-18 DIAGNOSIS — J4541 Moderate persistent asthma with (acute) exacerbation: Secondary | ICD-10-CM

## 2019-02-18 MED ORDER — IPRATROPIUM-ALBUTEROL 0.5-2.5 (3) MG/3ML IN SOLN
3.0000 mL | Freq: Four times a day (QID) | RESPIRATORY_TRACT | Status: DC
  Administered 2019-02-18: 3 mL via RESPIRATORY_TRACT

## 2019-02-18 MED ORDER — ALBUTEROL SULFATE (2.5 MG/3ML) 0.083% IN NEBU
5.0000 mg | INHALATION_SOLUTION | Freq: Once | RESPIRATORY_TRACT | Status: AC
  Administered 2019-02-18: 2.5 mg via RESPIRATORY_TRACT

## 2019-02-18 MED ORDER — ALBUTEROL SULFATE (2.5 MG/3ML) 0.083% IN NEBU
INHALATION_SOLUTION | RESPIRATORY_TRACT | Status: AC
  Administered 2019-02-18: 2.5 mg via RESPIRATORY_TRACT
  Filled 2019-02-18: qty 3

## 2019-02-18 MED ORDER — IPRATROPIUM-ALBUTEROL 0.5-2.5 (3) MG/3ML IN SOLN
RESPIRATORY_TRACT | Status: AC
  Administered 2019-02-18: 3 mL via RESPIRATORY_TRACT
  Filled 2019-02-18: qty 3

## 2019-02-18 MED ORDER — ALBUTEROL SULFATE HFA 108 (90 BASE) MCG/ACT IN AERS
1.0000 | INHALATION_SPRAY | Freq: Once | RESPIRATORY_TRACT | Status: AC
  Administered 2019-02-18: 1 via RESPIRATORY_TRACT
  Filled 2019-02-18: qty 6.7

## 2019-02-18 MED ORDER — PREDNISONE 20 MG PO TABS: 40.0000 mg | ORAL_TABLET | Freq: Every day | ORAL | 0 refills | Status: DC

## 2019-02-18 MED ORDER — ALBUTEROL SULFATE (2.5 MG/3ML) 0.083% IN NEBU
2.5000 mg | INHALATION_SOLUTION | Freq: Once | RESPIRATORY_TRACT | Status: AC
  Administered 2019-02-18: 2.5 mg via RESPIRATORY_TRACT

## 2019-02-18 MED ORDER — ALBUTEROL SULFATE (2.5 MG/3ML) 0.083% IN NEBU: 2.5000 mg | INHALATION_SOLUTION | RESPIRATORY_TRACT | 0 refills | Status: DC | PRN

## 2019-02-18 NOTE — ED Triage Notes (Signed)
Patient states that she has been SOB for the last 2 - 4 days  - patient has a coarse cough at this time

## 2019-02-18 NOTE — ED Provider Notes (Signed)
[facility] Provider Note   CSN: [id number] Arrival date & time: 02/18/19  1732    History   Chief Complaint Chief Complaint  Patient presents with  . Cough    HPI Wendy Hoffman is a 53 y.o. female.     The history is provided by the patient and medical records. No language interpreter was used.  Cough   Wendy Hoffman is a 53 y.o. female who presents to the Emergency Department complaining of cough/wheeze. Resents to the emergency department complaining of a week and a half of coughing and wheezing. She has cough with associated audible wheezing. She has been using albuterol treatments at home with transient improvement in her symptoms. She ran out of her albuterol nebulizer as well as inhaler yesterday. She presents today for persistent wheezing. She denies any fevers. She does have some back tightness when she is wheezing. He denies any fevers, hemoptysis, abdominal pain, nausea, vomiting. She does have intermittent lower extremity edema. She also has a history of hypertension. She does not currently have any antihypertensive medications. She is scheduled to follow-up with PCP in 10 days. She is a former smoker. Denies any alcohol or drug use. Past Medical History:  Diagnosis Date  . Anemia   . Anxiety   . Arthritis   . Asthma   . Cancer ([place])    cervic  . H/O: hysterectomy   . High cholesterol   . Hypertension   . Hypothyroidism   . Insomnia   . Medical history non-contributory   . Migraine   . Morbid obesity ([place])   . Shortness of breath   . Stroke ([place])   . Thyroid disease     Patient Active Problem List   Diagnosis Date Noted  . Snoring 05/03/2016  . Morbid obesity ([place]) 04/25/2014  . Asthma exacerbation 04/22/2014  . Asthma attack 04/22/2014  . Asthma with acute exacerbation 11/03/2013  . Unspecified hypothyroidism 11/03/2013  . Tobacco abuse 11/03/2013  . Migraine 09/22/2012  . Weakness of right side of body 09/22/2012    . Thyroid disease   . Hypertension   . High cholesterol   . H/O: hysterectomy   . ASTHMA 11/26/2008  . HEADACHE, CHRONIC 11/26/2008    Past Surgical History:  Procedure Laterality Date  . ABDOMINAL HYSTERECTOMY    . CESAREAN SECTION     3 occasions  . CHOLECYSTECTOMY    . THYROIDECTOMY, PARTIAL     Goiter     OB History   No obstetric history on file.      Home Medications    Prior to Admission medications   Medication Sig Start Date End Date Taking? Authorizing Provider  albuterol (PROVENTIL) (2.5 MG/3ML) 0.083% nebulizer solution Take 3 mLs (2.5 mg total) by nebulization every 4 (four) hours as needed for wheezing or shortness of breath. 02/18/19   Quintella Reichert, MD  budesonide-formoterol [facility]) 160-4.5 MCG/ACT inhaler Inhale 2 puffs into the lungs 2 (two) times daily.    [provider]  doxycycline (VIBRAMYCIN) 100 MG capsule Take 1 capsule (100 mg total) by mouth 2 (two) times daily. 12/29/17   Petrucelli, Samantha R, PA-C  fluticasone (FLONASE) 50 MCG/ACT nasal spray Place 1 spray into both nostrils daily. 12/29/17   Petrucelli, Samantha R, PA-C  Ibuprofen-Diphenhydramine HCl (ADVIL PM) 200-25 MG CAPS Take 1-2 tablets by mouth at bedtime as needed (sleep).    [provider]  predniSONE (DELTASONE) 20 MG tablet Take 2 tablets (40 mg total) by mouth  daily. Then take 1 tab (20mg ) po for 3 days and then take 0.5 tab (10mg ) for 4 days 02/18/19   Quintella Reichert, MD    Family History Family History  Problem Relation Age of Onset  . Migraines Sister     Social History Social History   Tobacco Use  . Smoking status: Former Smoker    Types: Cigarettes  . Smokeless tobacco: Never Used  Substance Use Topics  . Alcohol use: No  . Drug use: No     Allergies   Blueberry [vaccinium angustifolium]; Mango flavor; Aspirin; and Penicillins   Review of Systems Review of Systems  Respiratory: Positive for cough.   All other systems reviewed and are  negative.    Physical Exam Updated Vital Signs BP (!) 131/100 (BP Location: Left Arm)   Pulse 87   Temp 98.6 F (37 C) (Oral)   Resp (!) 22   Wt 131.9 kg   SpO2 100%   BMI 49.92 kg/m   Physical Exam Vitals signs and nursing note reviewed.  Constitutional:      Appearance: She is well-developed.  HENT:     Head: Normocephalic and atraumatic.  Cardiovascular:     Rate and Rhythm: Normal rate and regular rhythm.     Heart sounds: No murmur.  Pulmonary:     Effort: Pulmonary effort is normal. No respiratory distress.     Breath sounds: Normal breath sounds.  Abdominal:     Palpations: Abdomen is soft.     Tenderness: There is no abdominal tenderness. There is no guarding or rebound.  Musculoskeletal:        General: No swelling or tenderness.  Skin:    General: Skin is warm and dry.  Neurological:     Mental Status: She is alert and oriented to person, place, and time.  Psychiatric:        Behavior: Behavior normal.      ED Treatments / Results  Labs (all labs ordered are listed, but only abnormal results are displayed) Labs Reviewed - No data to display  EKG None  Radiology No results found.  Procedures Procedures (including critical care time)  Medications Ordered in ED Medications  albuterol (PROVENTIL HFA;VENTOLIN HFA) 108 (90 Base) MCG/ACT inhaler 1 puff (has no administration in time range)  albuterol (PROVENTIL) (2.5 MG/3ML) 0.083% nebulizer solution 5 mg (2.5 mg Nebulization Given 02/18/19 1800)  albuterol (PROVENTIL) (2.5 MG/3ML) 0.083% nebulizer solution 2.5 mg (2.5 mg Nebulization Given 02/18/19 1821)     Initial Impression / Assessment and Plan / ED Course  I have reviewed the triage vital signs and the nursing notes.  Pertinent labs & imaging results that were available during my care of the patient were reviewed by me and considered in my medical decision making (see chart for details).        Patient with history of asthma here for  evaluation of shortness of breath and wheezing, out of her inhaler. She was wheezing on ED presentation and received albuterol treatments. On my assessment her wheezing has resolved and she has good air movement bilaterally. No clinical evidence of pneumonia, pneumothorax, CHF. Presentation is not consistent with PE. Discussed with patient home care for asthma and asthma exacerbation. Discussed close outpatient follow-up as well as return precautions.  Final Clinical Impressions(s) / ED Diagnoses   Final diagnoses:  Moderate persistent asthma with exacerbation    ED Discharge Orders         Ordered    albuterol (PROVENTIL) (  2.5 MG/3ML) 0.083% nebulizer solution  Every 4 hours PRN     02/18/19 1859    predniSONE (DELTASONE) 20 MG tablet  Daily     02/18/19 1859           Quintella Reichert, MD 02/18/19 [phone]

## 2019-02-28 ENCOUNTER — Inpatient Hospital Stay: Payer: Self-pay | Admitting: Nurse Practitioner

## 2019-03-04 ENCOUNTER — Other Ambulatory Visit: Payer: Self-pay

## 2019-03-04 ENCOUNTER — Emergency Department (HOSPITAL_BASED_OUTPATIENT_CLINIC_OR_DEPARTMENT_OTHER)
Admission: EM | Admit: 2019-03-04 | Discharge: 2019-03-04 | Disposition: A | Discharge status: Home / Self Care | Payer: Self-pay | Attending: Emergency Medicine | Admitting: Emergency Medicine

## 2019-03-04 ENCOUNTER — Encounter (HOSPITAL_BASED_OUTPATIENT_CLINIC_OR_DEPARTMENT_OTHER): Payer: Self-pay | Admitting: *Deleted

## 2019-03-04 DIAGNOSIS — L03213 Periorbital cellulitis: Secondary | ICD-10-CM | POA: Insufficient documentation

## 2019-03-04 DIAGNOSIS — Z87891 Personal history of nicotine dependence: Secondary | ICD-10-CM | POA: Insufficient documentation

## 2019-03-04 DIAGNOSIS — J4541 Moderate persistent asthma with (acute) exacerbation: Secondary | ICD-10-CM | POA: Insufficient documentation

## 2019-03-04 DIAGNOSIS — E039 Hypothyroidism, unspecified: Secondary | ICD-10-CM | POA: Insufficient documentation

## 2019-03-04 DIAGNOSIS — I10 Essential (primary) hypertension: Secondary | ICD-10-CM | POA: Insufficient documentation

## 2019-03-04 MED ORDER — PREDNISONE 20 MG PO TABS: 40.0000 mg | ORAL_TABLET | Freq: Every day | ORAL | 0 refills | Status: AC

## 2019-03-04 MED ORDER — PREDNISONE 50 MG PO TABS
60.0000 mg | ORAL_TABLET | Freq: Once | ORAL | Status: AC
  Administered 2019-03-04: 60 mg via ORAL
  Filled 2019-03-04: qty 1

## 2019-03-04 MED ORDER — CLINDAMYCIN HCL 150 MG PO CAPS: 450.0000 mg | ORAL_CAPSULE | Freq: Three times a day (TID) | ORAL | 0 refills | Status: AC

## 2019-03-04 MED ORDER — ALBUTEROL SULFATE HFA 108 (90 BASE) MCG/ACT IN AERS
2.0000 | INHALATION_SPRAY | Freq: Once | RESPIRATORY_TRACT | Status: AC
  Administered 2019-03-04: 2 via RESPIRATORY_TRACT
  Filled 2019-03-04: qty 6.7

## 2019-03-04 MED ORDER — ERYTHROMYCIN 5 MG/GM OP OINT: TOPICAL_OINTMENT | OPHTHALMIC | 0 refills | Status: DC

## 2019-03-04 MED ORDER — IPRATROPIUM-ALBUTEROL 0.5-2.5 (3) MG/3ML IN SOLN
RESPIRATORY_TRACT | Status: AC
  Administered 2019-03-04: 9 mL
  Filled 2019-03-04: qty 9

## 2019-03-04 MED ORDER — ALBUTEROL SULFATE HFA 108 (90 BASE) MCG/ACT IN AERS: 1.0000 | INHALATION_SPRAY | Freq: Four times a day (QID) | RESPIRATORY_TRACT | 0 refills | Status: DC | PRN

## 2019-03-04 NOTE — ED Notes (Signed)
ED Provider at bedside. 

## 2019-03-04 NOTE — ED Provider Notes (Signed)
Emergency Department Provider Note   I have reviewed the triage vital signs and the nursing notes.   HISTORY  Chief Complaint Asthma and Facial Swelling   HPI Wendy Hoffman is a 53 y.o. female with PMH of asthma and arthritis presents to the ED with wheezing, shortness of breath, right eye swelling.  Patient has run out of her albuterol inhaler.  She woke up this morning with right eye swelling which is painful to touch but not with eye movement.  She does have some blurry vision in the right eye with a film over it which clears when wiping.  She denies any injury to the eye.  She does not wear contact lenses.  Her shortness of breath has been worsening over the past several days.  She is not experiencing fever, chills, body aches.  She does have mild cough.  No recent travel or COVID exposure.   Past Medical History:  Diagnosis Date  . Anemia   . Anxiety   . Arthritis   . Asthma   . Cancer ([place])    cervic  . H/O: hysterectomy   . High cholesterol   . Hypertension   . Hypothyroidism   . Insomnia   . Medical history non-contributory   . Migraine   . Morbid obesity ([place])   . Shortness of breath   . Stroke ([place])   . Thyroid disease     Patient Active Problem List   Diagnosis Date Noted  . Snoring 05/03/2016  . Morbid obesity ([place]) 04/25/2014  . Asthma exacerbation 04/22/2014  . Asthma attack 04/22/2014  . Asthma with acute exacerbation 11/03/2013  . Unspecified hypothyroidism 11/03/2013  . Tobacco abuse 11/03/2013  . Migraine 09/22/2012  . Weakness of right side of body 09/22/2012  . Thyroid disease   . Hypertension   . High cholesterol   . H/O: hysterectomy   . ASTHMA 11/26/2008  . HEADACHE, CHRONIC 11/26/2008    Past Surgical History:  Procedure Laterality Date  . ABDOMINAL HYSTERECTOMY    . CESAREAN SECTION     3 occasions  . CHOLECYSTECTOMY    . THYROIDECTOMY, PARTIAL     Goiter   Allergies Blueberry [vaccinium angustifolium]; Mango flavor;  Aspirin; and Penicillins  Family History  Problem Relation Age of Onset  . Migraines Sister     Social History Social History   Tobacco Use  . Smoking status: Former Smoker    Types: Cigarettes  . Smokeless tobacco: Never Used  Substance Use Topics  . Alcohol use: No  . Drug use: No    Review of Systems  Constitutional: No fever/chills Eyes: Blurry vision right eye and right eye swelling.  ENT: No sore throat. Cardiovascular: Denies chest pain. Respiratory: Positive SOB and wheezing.  Gastrointestinal: No abdominal pain.  No nausea, no vomiting.  No diarrhea.  No constipation. Genitourinary: Negative for dysuria. Musculoskeletal: Negative for back pain. Skin: Negative for rash. Neurological: Negative for headaches, focal weakness or numbness.  10-point ROS otherwise negative.  ____________________________________________   PHYSICAL EXAM:  VITAL SIGNS: ED Triage Vitals  Enc Vitals Group     BP 03/04/19 1440 (!) 157/100     Pulse Rate 03/04/19 1440 100     Resp 03/04/19 1440 (!) 28     Temp 03/04/19 1440 98 F (36.7 C)     Temp Source 03/04/19 1440 Oral     SpO2 03/04/19 1440 99 %     Weight 03/04/19 1441 291 lb 0.1 oz (132 kg)  Height 03/04/19 1441 5\' 4"  (1.626 m)     Pain Score 03/04/19 1441 6   Constitutional: Alert and oriented. Well appearing and in no acute distress. Eyes: Conjunctivae are normal. PERRL. EOMI without pain. Diffuse upper lid swelling and redness. No periorbital tenderness.  Head: Atraumatic. Nose: No congestion/rhinnorhea. Mouth/Throat: Mucous membranes are moist.  Neck: No stridor.  Cardiovascular: Normal rate, regular rhythm. Good peripheral circulation. Grossly normal heart sounds.   Respiratory: Normal respiratory effort.  No retractions. Lungs with mild end-expiratory wheezing bilaterally. Symmetrical exam.  Gastrointestinal: Soft and nontender. No distention.  Musculoskeletal: No lower extremity tenderness nor edema. No gross  deformities of extremities. Neurologic:  Normal speech and language. No gross focal neurologic deficits are appreciated.  Skin:  Skin is warm, dry and intact. No rash noted.  ____________________________________________  RADIOLOGY  None  ____________________________________________   PROCEDURES  Procedure(s) performed:   Procedures  None  ____________________________________________   INITIAL IMPRESSION / ASSESSMENT AND PLAN / ED COURSE  Pertinent labs & imaging results that were available during my care of the patient were reviewed by me and considered in my medical decision making (see chart for details).  Patient presents to the emergency department with moderate asthma exacerbation and preseptal cellulitis of the right eye.  Exam not concerning for orbital cellulitis.  Plan for warm compress, clindamycin, erythromycin ointment, and PCP follow-up.  Patient does not wear contact lenses.  I evaluated the patient after 3 DuoNeb's given in triage.  She reports feeling much better.  She does not have increased work of breathing.  Oxygen saturations have remained normal.  I do not feel the patient would benefit from x-ray at this time.  Plan for steroid burst, albuterol inhaler, and antibiotics for the orbital cellulitis, and PCP follow-up.   ____________________________________________  FINAL CLINICAL IMPRESSION(S) / ED DIAGNOSES  Final diagnoses:  Moderate persistent asthma with exacerbation  Preseptal cellulitis of right eye     MEDICATIONS GIVEN DURING THIS VISIT:  Medications  albuterol (PROVENTIL HFA;VENTOLIN HFA) 108 (90 Base) MCG/ACT inhaler 2 puff (has no administration in time range)  predniSONE (DELTASONE) tablet 60 mg (has no administration in time range)  ipratropium-albuterol (DUONEB) 0.5-2.5 (3) MG/3ML nebulizer solution (9 mLs  Given 03/04/19 1444)     NEW OUTPATIENT MEDICATIONS STARTED DURING THIS VISIT:  New Prescriptions   ALBUTEROL (PROVENTIL  HFA;VENTOLIN HFA) 108 (90 BASE) MCG/ACT INHALER    Inhale 1-2 puffs into the lungs every 6 (six) hours as needed for wheezing or shortness of breath.   CLINDAMYCIN (CLEOCIN) 150 MG CAPSULE    Take 3 capsules (450 mg total) by mouth 3 (three) times daily for 7 days.   ERYTHROMYCIN OPHTHALMIC OINTMENT    Place a 1/2 inch ribbon of ointment into the right lower eyelid four times per day for 7 days.   PREDNISONE (DELTASONE) 20 MG TABLET    Take 2 tablets (40 mg total) by mouth daily for 4 days.    Note:  This document was prepared using Dragon voice recognition software and may include unintentional dictation errors.  Nanda Quinton, MD Emergency Medicine    Kalyna Paolella, [facility], MD 03/04/19 [phone]

## 2019-03-04 NOTE — Discharge Instructions (Signed)
We believe that your symptoms are caused today by an exacerbation of your asthma.  Please take the prescribed medications and any medications that you have at home.  Follow up with your doctor as recommended.  If you develop any new or worsening symptoms, including but not limited to fever, persistent vomiting, worsening shortness of breath, or other symptoms that concern you, please return to the Emergency Department immediately. ° ° °Asthma °Asthma is a recurring condition in which the airways tighten and narrow. Asthma can make it difficult to breathe. It can cause coughing, wheezing, and shortness of breath. Asthma episodes, also called asthma attacks, range from minor to life-threatening. Asthma cannot be cured, but medicines and lifestyle changes can help control it. °CAUSES °Asthma is believed to be caused by inherited (genetic) and environmental factors, but its exact cause is unknown. Asthma may be triggered by allergens, lung infections, or irritants in the air. Asthma triggers are different for each person. Common triggers include:  °Animal dander. °Dust mites. °Cockroaches. °Pollen from trees or grass. °Mold. °Smoke. °Air pollutants such as dust, household cleaners, hair sprays, aerosol sprays, paint fumes, strong chemicals, or strong odors. °Cold air, weather changes, and winds (which increase molds and pollens in the air). °Strong emotional expressions such as crying or laughing hard. °Stress. °Certain medicines (such as aspirin) or types of drugs (such as beta-blockers). °Sulfites in foods and drinks. Foods and drinks that may contain sulfites include dried fruit, potato chips, and sparkling grape juice. °Infections or inflammatory conditions such as the flu, a cold, or an inflammation of the nasal membranes (rhinitis). °Gastroesophageal reflux disease (GERD). °Exercise or strenuous activity. °SYMPTOMS °Symptoms may occur immediately after asthma is triggered or many hours later. Symptoms  include: °Wheezing. °Excessive nighttime or early morning coughing. °Frequent or severe coughing with a common cold. °Chest tightness. °Shortness of breath. °DIAGNOSIS  °The diagnosis of asthma is made by a review of your medical history and a physical exam. Tests may also be performed. These may include: °Lung function studies. These tests show how much air you breathe in and out. °Allergy tests. °Imaging tests such as X-rays. °TREATMENT  °Asthma cannot be cured, but it can usually be controlled. Treatment involves identifying and avoiding your asthma triggers. It also involves medicines. There are 2 classes of medicine used for asthma treatment:  °Controller medicines. These prevent asthma symptoms from occurring. They are usually taken every day. °Reliever or rescue medicines. These quickly relieve asthma symptoms. They are used as needed and provide short-term relief. °Your health care provider will help you create an asthma action plan. An asthma action plan is a written plan for managing and treating your asthma attacks. It includes a list of your asthma triggers and how they may be avoided. It also includes information on when medicines should be taken and when their dosage should be changed. An action plan may also involve the use of a device called a peak flow meter. A peak flow meter measures how well the lungs are working. It helps you monitor your condition. °HOME CARE INSTRUCTIONS  °Take medicines only as directed by your health care provider. Speak with your health care provider if you have questions about how or when to take the medicines. °Use a peak flow meter as directed by your health care provider. Record and keep track of readings. °Understand and use the action plan to help minimize or stop an asthma attack without needing to seek medical care. °Control your home environment in the following   ways to help prevent asthma attacks: °Do not smoke. Avoid being exposed to secondhand smoke. °Change  your heating and air conditioning filter regularly. °Limit your use of fireplaces and wood stoves. °Get rid of pests (such as roaches and mice) and their droppings. °Throw away plants if you see mold on them. °Clean your floors and dust regularly. Use unscented cleaning products. °Try to have someone else vacuum for you regularly. Stay out of rooms while they are being vacuumed and for a short while afterward. If you vacuum, use a dust mask from a hardware store, a double-layered or microfilter vacuum cleaner bag, or a vacuum cleaner with a HEPA filter. °Replace carpet with wood, tile, or vinyl flooring. Carpet can trap dander and dust. °Use allergy-proof pillows, mattress covers, and box spring covers. °Wash bed sheets and blankets every week in hot water and dry them in a dryer. °Use blankets that are made of polyester or cotton. °Clean bathrooms and kitchens with bleach. If possible, have someone repaint the walls in these rooms with mold-resistant paint. Keep out of the rooms that are being cleaned and painted. °Wash hands frequently. °SEEK MEDICAL CARE IF:  °You have wheezing, shortness of breath, or a cough even if taking medicine to prevent attacks. °The colored mucus you cough up (sputum) is thicker than usual. °Your sputum changes from clear or white to yellow, green, gray, or bloody. °You have any problems that may be related to the medicines you are taking (such as a rash, itching, swelling, or trouble breathing). °You are using a reliever medicine more than 2-3 times per week. °Your peak flow is still at 50-79% of your personal best after following your action plan for 1 hour. °You have a fever. °SEEK IMMEDIATE MEDICAL CARE IF:  °You seem to be getting worse and are unresponsive to treatment during an asthma attack. °You are short of breath even at rest. °You get short of breath when doing very little physical activity. °You have difficulty eating, drinking, or talking due to asthma symptoms. °You  develop chest pain. °You develop a fast heartbeat. °You have a bluish color to your lips or fingernails. °You are light-headed, dizzy, or faint. °Your peak flow is less than 50% of your personal best. °MAKE SURE YOU:  °Understand these instructions. °Will watch your condition. °Will get help right away if you are not doing well or get worse. °Document Released: 11/29/2005 Document Revised: 04/15/2014 Document Reviewed: 06/28/2013 °ExitCare® Patient Information ©2015 [employer]. This information is not intended to replace advice given to you by your health care provider. Make sure you discuss any questions you have with your health care provider. ° °How to Use a Nebulizer °If you have asthma or other breathing problems, you might need to breathe in (inhale) medicine. This can be done with a nebulizer. A nebulizer is a device that turns liquid medicine into a mist that you can inhale.  °There are different kinds of nebulizers. Most are small. With some, you breathe in through a mouthpiece. With others, a mask fits over your nose and mouth. Most nebulizers must be connected to a small air compressor. Air is forced through tubing from the compressor to the nebulizer. The forced air changes the liquid into a fine spray. °RISKS AND COMPLICATIONS °The nebulizer must work properly for it to help your breathing. If the nebulizer does not produce mist, or if foam comes out, this indicates that the nebulizer is not working properly. Sometimes a filter can get clogged, or   there might be a problem with the air compressor. Check the instruction booklet that came with your nebulizer. It should tell you how to fix problems or where to call for help. You should have at least one extra nebulizer at home. That way, you will always have one when you need it.  °HOW TO PREPARE BEFORE USING THE NEBULIZER °Take these steps before using the nebulizer: °Check your medicine. Make sure it has not expired and is not damaged in any way.    °Wash your hands with soap and water.   °Put all the parts of your nebulizer on a sturdy, flat surface. Make sure the tubing connects the compressor and the nebulizer. °Measure the liquid medicine according to your health care provider's instructions. Pour it into the nebulizer. °Attach the mouthpiece or mask.   °Test the nebulizer by turning it on to make sure a spray is coming out. Then, turn it off.   °HOW TO USE THE NEBULIZER °Sit down and focus on staying relaxed.   °If your nebulizer has a mask, put it over your nose and mouth. If you use a mouthpiece, put it in your mouth. Press your lips firmly around the mouthpiece. °Turn on the nebulizer.   °Breathe out.   °Some nebulizers have a finger valve. If yours does, cover up the air hole so the air gets to the nebulizer. °Once the medicine begins to mist out, take slow, deep breaths. If there is a finger valve, release it at the end of your breath. °Continue taking slow, deep breaths until the nebulizer is empty.   °Be sure to stop the machine at any point if you start coughing or if the medicine foams or bubbles. °HOW TO CLEAN THE NEBULIZER  °The nebulizer and all its parts must be kept very clean. Follow the manufacturer's instructions for cleaning. For most nebulizers, you should follow these guidelines: °Wash the nebulizer after each use. Use warm water and soap. Rinse it well. Shake the nebulizer to remove extra water. Put it on a clean towel until it is completely dry. To make sure it is dry, put the nebulizer back together. Turn on the compressor for a few minutes. This will blow air through the nebulizer.   °Do not wash the tubing or the finger valve.   °Store the nebulizer in a dust-free place.   °Inspect the filter every week. Replace it any time it looks dirty.   °Sometimes the nebulizer will need a more complete cleaning. The instruction booklet should say how often you need to do this. °SEEK MEDICAL CARE IF:  °You continue to have difficulty  breathing.   °You have trouble using the nebulizer.   °Document Released: 11/17/2009 Document Revised: 04/15/2014 Document Reviewed: 05/21/2013 °ExitCare® Patient Information ©2015 [employer]. This information is not intended to replace advice given to you by your health care provider. Make sure you discuss any questions you have with your health care provider. ° °How to Use an Inhaler °Proper inhaler technique is very important. Good technique ensures that the medicine reaches the lungs. Poor technique results in depositing the medicine on the tongue and back of the throat rather than in the airways. If you do not use the inhaler with good technique, the medicine will not help you. °STEPS TO FOLLOW IF USING AN INHALER WITHOUT AN EXTENSION TUBE °Remove the cap from the inhaler. °If you are using the inhaler for the first time, you will need to prime it. Shake the inhaler for   5 seconds and release four puffs into the air, away from your face. Ask your health care provider or pharmacist if you have questions about priming your inhaler. °Shake the inhaler for 5 seconds before each breath in (inhalation). °Position the inhaler so that the top of the canister faces up. °Put your index finger on the top of the medicine canister. Your thumb supports the bottom of the inhaler. °Open your mouth. °Either place the inhaler between your teeth and place your lips tightly around the mouthpiece, or hold the inhaler 1-2 inches away from your open mouth. If you are unsure of which technique to use, ask your health care provider. °Breathe out (exhale) normally and as completely as possible. °Press the canister down with your index finger to release the medicine. °At the same time as the canister is pressed, inhale deeply and slowly until your lungs are completely filled. This should take 4-6 seconds. Keep your tongue down. °Hold the medicine in your lungs for 5-10 seconds (10 seconds is best). This helps the medicine get into the  small airways of your lungs. °Breathe out slowly, through pursed lips. Whistling is an example of pursed lips. °Wait at least 15-30 seconds between puffs. Continue with the above steps until you have taken the number of puffs your health care provider has ordered. Do not use the inhaler more than your health care provider tells you. °Replace the cap on the inhaler. °Follow the directions from your health care provider or the inhaler insert for cleaning the inhaler. °STEPS TO FOLLOW IF USING AN INHALER WITH AN EXTENSION (SPACER) °Remove the cap from the inhaler. °If you are using the inhaler for the first time, you will need to prime it. Shake the inhaler for 5 seconds and release four puffs into the air, away from your face. Ask your health care provider or pharmacist if you have questions about priming your inhaler. °Shake the inhaler for 5 seconds before each breath in (inhalation). °Place the open end of the spacer onto the mouthpiece of the inhaler. °Position the inhaler so that the top of the canister faces up and the spacer mouthpiece faces you. °Put your index finger on the top of the medicine canister. Your thumb supports the bottom of the inhaler and the spacer. °Breathe out (exhale) normally and as completely as possible. °Immediately after exhaling, place the spacer between your teeth and into your mouth. Close your lips tightly around the spacer. °Press the canister down with your index finger to release the medicine. °At the same time as the canister is pressed, inhale deeply and slowly until your lungs are completely filled. This should take 4-6 seconds. Keep your tongue down and out of the way. °Hold the medicine in your lungs for 5-10 seconds (10 seconds is best). This helps the medicine get into the small airways of your lungs. Exhale. °Repeat inhaling deeply through the spacer mouthpiece. Again hold that breath for up to 10 seconds (10 seconds is best). Exhale slowly. If it is difficult to take  this second deep breath through the spacer, breathe normally several times through the spacer. Remove the spacer from your mouth. °Wait at least 15-30 seconds between puffs. Continue with the above steps until you have taken the number of puffs your health care provider has ordered. Do not use the inhaler more than your health care provider tells you. °Remove the spacer from the inhaler, and place the cap on the inhaler. °Follow the directions from your health care provider   or the inhaler insert for cleaning the inhaler and spacer. °If you are using different kinds of inhalers, use your quick relief medicine to open the airways 10-15 minutes before using a steroid if instructed to do so by your health care provider. If you are unsure which inhalers to use and the order of using them, ask your health care provider, nurse, or respiratory therapist. °If you are using a steroid inhaler, always rinse your mouth with water after your last puff, then gargle and spit out the water. Do not swallow the water. °AVOID: °Inhaling before or after starting the spray of medicine. It takes practice to coordinate your breathing with triggering the spray. °Inhaling through the nose (rather than the mouth) when triggering the spray. °HOW TO DETERMINE IF YOUR INHALER IS FULL OR NEARLY EMPTY °You cannot know when an inhaler is empty by shaking it. A few inhalers are now being made with dose counters. Ask your health care provider for a prescription that has a dose counter if you feel you need that extra help. If your inhaler does not have a counter, ask your health care provider to help you determine the date you need to refill your inhaler. Write the refill date on a calendar or your inhaler canister. Refill your inhaler 7-10 days before it runs out. Be sure to keep an adequate supply of medicine. This includes making sure it is not expired, and that you have a spare inhaler.  °SEEK MEDICAL CARE IF:  °Your symptoms are only partially  relieved with your inhaler. °You are having trouble using your inhaler. °You have some increase in phlegm. °SEEK IMMEDIATE MEDICAL CARE IF:  °You feel little or no relief with your inhalers. You are still wheezing and are feeling shortness of breath or tightness in your chest or both. °You have dizziness, headaches, or a fast heart rate. °You have chills, fever, or night sweats. °You have a noticeable increase in phlegm production, or there is blood in the phlegm. °MAKE SURE YOU:  °Understand these instructions. °Will watch your condition. °Will get help right away if you are not doing well or get worse. °Document Released: 11/26/2000 Document Revised: 09/19/2013 Document Reviewed: 06/28/2013 °Exit[employer]® Patient Information ©2015 [employer]. This information is not intended to replace advice given to you by your health care provider. Make sure you discuss any questions you have with your health care provider. ° ° ° °

## 2019-03-04 NOTE — ED Triage Notes (Addendum)
Pt reports she has been SOB x 2 days. She ran out of inhaler this morning. She states when she woke up this am her right eye was swollen shut and is painful. Audibly wheezing upon arrival. Taken to room 6 for triage. RT at bedside. Denies fever, denies known covid-19 exposure

## 2019-03-21 ENCOUNTER — Ambulatory Visit: Payer: Self-pay | Attending: Family Medicine | Admitting: Family Medicine

## 2019-03-21 ENCOUNTER — Encounter: Payer: Self-pay | Admitting: Family Medicine

## 2019-03-21 ENCOUNTER — Other Ambulatory Visit: Payer: Self-pay

## 2019-03-21 VITALS — BP 137/89 | HR 88 | Temp 97.9°F | Wt 294.0 lb

## 2019-03-21 DIAGNOSIS — J4540 Moderate persistent asthma, uncomplicated: Secondary | ICD-10-CM

## 2019-03-21 DIAGNOSIS — M62838 Other muscle spasm: Secondary | ICD-10-CM

## 2019-03-21 DIAGNOSIS — E890 Postprocedural hypothyroidism: Secondary | ICD-10-CM

## 2019-03-21 DIAGNOSIS — E876 Hypokalemia: Secondary | ICD-10-CM

## 2019-03-21 MED ORDER — BUDESONIDE-FORMOTEROL FUMARATE 160-4.5 MCG/ACT IN AERO: 2.0000 | INHALATION_SPRAY | Freq: Two times a day (BID) | RESPIRATORY_TRACT | 3 refills | Status: DC

## 2019-03-21 MED ORDER — ALBUTEROL SULFATE HFA 108 (90 BASE) MCG/ACT IN AERS: 1.0000 | INHALATION_SPRAY | Freq: Four times a day (QID) | RESPIRATORY_TRACT | 3 refills | Status: DC | PRN

## 2019-03-21 NOTE — Progress Notes (Signed)
Subjective:  Patient ID: Wendy Hoffman, female    DOB: 06-07-1966  Age: 53 y.o. MRN: [id number]  CC: Hospitalization Follow-up   HPI Wendy Hoffman is a 53 year old obese female with a history of asthma, postsurgical hypothyroidism who presents today to establish care. She has not been under the care of any physician for several years. Two weeks ago she was seen at the ED for asthma where she received refill of Proventil which she has been using.  She should be on Symbicort however due to lack of medical coverage she has not been on it.  Endorses having to use her rescue inhaler several times a day.  Quit smoking 3 months ago. She also informs me she was previously on levothyroxine for her thyroid and has not been on this in years.  Somehow she thinks her neck might be enlarged at the site of her thyroid surgery.  Also complains of muscle spasms on the left side of her neck and this radiates down her left arm.  Muscle spasms are precipitated by sudden movement and is absent at this time. Also states she sometimes has pedal edema at the end of the day but at this time edema is absent.  She has other concerns including insomnia which I have informed her will be addressed at a subsequent visit.  Past Medical History:  Diagnosis Date  . Anemia   . Anxiety   . Arthritis   . Asthma   . Cancer ([place])    cervic  . H/O: hysterectomy   . High cholesterol   . Hypertension   . Hypothyroidism   . Insomnia   . Medical history non-contributory   . Migraine   . Morbid obesity ([place])   . Shortness of breath   . Stroke ([place])   . Thyroid disease     Past Surgical History:  Procedure Laterality Date  . ABDOMINAL HYSTERECTOMY    . CESAREAN SECTION     3 occasions  . CHOLECYSTECTOMY    . THYROIDECTOMY, PARTIAL     Goiter    Family History  Problem Relation Age of Onset  . Migraines Sister     Allergies  Allergen Reactions  . Blueberry [Vaccinium Angustifolium] Anaphylaxis  . Mango  Flavor Anaphylaxis  . Aspirin Rash  . Penicillins Hives    Has patient had a PCN reaction causing immediate rash, facial/tongue/throat swelling, SOB or lightheadedness with hypotension: Yes Has patient had a PCN reaction causing severe rash involving mucus membranes or skin necrosis: No Has patient had a PCN reaction that required hospitalization No Has patient had a PCN reaction occurring within the last 10 years: No If all of the above answers are "NO", then may proceed with Cephalosporin use.     Outpatient Medications Prior to Visit  Medication Sig Dispense Refill  . albuterol (PROVENTIL HFA;VENTOLIN HFA) 108 (90 Base) MCG/ACT inhaler Inhale 1-2 puffs into the lungs every 6 (six) hours as needed for wheezing or shortness of breath. 1 Inhaler 0  . budesonide-formoterol (SYMBICORT) 160-4.5 MCG/ACT inhaler Inhale 2 puffs into the lungs 2 (two) times daily.    Marland Kitchen doxycycline (VIBRAMYCIN) 100 MG capsule Take 1 capsule (100 mg total) by mouth 2 (two) times daily. (Patient not taking: Reported on 03/21/2019) 14 capsule 0  . erythromycin ophthalmic ointment Place a 1/2 inch ribbon of ointment into the right lower eyelid four times per day for 7 days. (Patient not taking: Reported on 03/21/2019) 1 g 0  . fluticasone (FLONASE) 50 MCG/ACT  nasal spray Place 1 spray into both nostrils daily. (Patient not taking: Reported on 03/21/2019) 16 g 2  . Ibuprofen-Diphenhydramine HCl (ADVIL PM) 200-25 MG CAPS Take 1-2 tablets by mouth at bedtime as needed (sleep).     No facility-administered medications prior to visit.      ROS Review of Systems  Constitutional: Negative for activity change, appetite change and fatigue.  HENT: Negative for congestion, sinus pressure and sore throat.   Eyes: Negative for visual disturbance.  Respiratory: Positive for shortness of breath and wheezing. Negative for cough and chest tightness.   Cardiovascular: Negative for chest pain and palpitations.  Gastrointestinal: Negative  for abdominal distention, abdominal pain and constipation.  Endocrine: Negative for polydipsia.  Genitourinary: Negative for dysuria and frequency.  Musculoskeletal:       See hpi  Skin: Negative for rash.  Neurological: Negative for tremors, light-headedness and numbness.  Hematological: Does not bruise/bleed easily.  Psychiatric/Behavioral: Negative for agitation and behavioral problems.    Objective:  BP 137/89   Pulse 88   Temp 97.9 F (36.6 C) (Oral)   Wt 294 lb (133.4 kg)   SpO2 98%   BMI 50.46 kg/m   BP/Weight 03/21/2019 03/31/6221 08/20/9891  Systolic BP 119 417 408  Diastolic BP 89 94 97  Wt. (Lbs) 294 291.01 290.8  BMI 50.46 49.95 49.92      Physical Exam Constitutional:      Appearance: She is well-developed.  Neck:     Comments: Healed thyroidectomy scar Cardiovascular:     Rate and Rhythm: Normal rate.     Heart sounds: Normal heart sounds. No murmur.  Pulmonary:     Effort: Pulmonary effort is normal.     Breath sounds: Normal breath sounds. No wheezing or rales.  Chest:     Chest wall: No tenderness.  Abdominal:     General: Bowel sounds are normal. There is no distension.     Palpations: Abdomen is soft. There is no mass.     Tenderness: There is no abdominal tenderness.  Musculoskeletal: Normal range of motion.  Neurological:     Mental Status: She is alert and oriented to person, place, and time.     CMP Latest Ref Rng & Units 12/18/2018 09/15/2018 03/15/2017  Glucose 70 - 99 mg/dL 127(H) 99 91  BUN 6 - 20 mg/dL 10 7 11   Creatinine 0.44 - 1.00 mg/dL 0.62 0.80 0.80  Sodium 135 - 145 mmol/L 140 139 139  Potassium 3.5 - 5.1 mmol/L 3.2(L) 3.2(L) 4.0  Chloride 98 - 111 mmol/L 108 106 103  CO2 22 - 32 mmol/L 24 25 -  Calcium 8.9 - 10.3 mg/dL 8.7(L) 8.6(L) -  Total Protein 6.5 - 8.1 g/dL - 6.8 -  Total Bilirubin 0.3 - 1.2 mg/dL - 0.3 -  Alkaline Phos 38 - 126 U/L - 60 -  AST 15 - 41 U/L - 15 -  ALT 0 - 44 U/L - 10 -    Lipid Panel     Component  Value Date/Time   CHOL  10/15/2008 0535    137        ATP III CLASSIFICATION:  <200     mg/dL   Desirable  200-239  mg/dL   Borderline High  >=240    mg/dL   High   TRIG 155 (H) 10/15/2008 0535   HDL 35 (L) 10/15/2008 0535   CHOLHDL 3.9 10/15/2008 0535   VLDL 31 10/15/2008 0535   LDLCALC  10/15/2008 0535  71        Total Cholesterol/HDL:CHD Risk Coronary Heart Disease Risk Table                     Men   Women  1/2 Average Risk   3.4   3.3    CBC    Component Value Date/Time   WBC 7.2 12/18/2018 1412   RBC 4.39 12/18/2018 1412   HGB 11.8 (L) 12/18/2018 1412   HCT 37.8 12/18/2018 1412   PLT 301 12/18/2018 1412   MCV 86.1 12/18/2018 1412   MCH 26.9 12/18/2018 1412   MCHC 31.2 12/18/2018 1412   RDW 15.5 12/18/2018 1412   LYMPHSABS 1.0 12/18/2018 1412   MONOABS 0.2 12/18/2018 1412   EOSABS 0.4 12/18/2018 1412   BASOSABS 0.0 12/18/2018 1412      Assessment & Plan:   1. Postoperative hypothyroidism She has not been on levothyroxine We will check levels and commence levothyroxine if indicated - TSH - T4, free - Basic Metabolic Panel  2. Moderate persistent asthma without complication Uncontrolled due to suboptimal therapy Refilled Symbicort for maintenance therapy and use Proventil for exacerbations - budesonide-formoterol (SYMBICORT) 160-4.5 MCG/ACT inhaler; Inhale 2 puffs into the lungs 2 (two) times daily.  Dispense: 1 Inhaler; Refill: 3 - albuterol (PROVENTIL HFA;VENTOLIN HFA) 108 (90 Base) MCG/ACT inhaler; Inhale 1-2 puffs into the lungs every 6 (six) hours as needed for wheezing or shortness of breath.  Dispense: 1 Inhaler; Refill: 3  3. Hypokalemia Last potassium was 3.2 which could explain muscle spasms Repeat potassium  4. Muscle spasm See #3 above   Meds ordered this encounter  Medications  . budesonide-formoterol (SYMBICORT) 160-4.5 MCG/ACT inhaler    Sig: Inhale 2 puffs into the lungs 2 (two) times daily.    Dispense:  1 Inhaler    Refill:  3   . albuterol (PROVENTIL HFA;VENTOLIN HFA) 108 (90 Base) MCG/ACT inhaler    Sig: Inhale 1-2 puffs into the lungs every 6 (six) hours as needed for wheezing or shortness of breath.    Dispense:  1 Inhaler    Refill:  3    Follow-up: Return in about 3 months (around 06/20/2019) for follow up of chronic medical conditions.       Charlott Rakes, MD, FAAFP. [facility] [place], [place]   03/21/2019, 10:36 AM

## 2019-03-21 NOTE — Patient Instructions (Signed)
Asthma, Adult    Asthma is a long-term (chronic) condition in which the airways get tight and narrow. The airways are the breathing passages that lead from the nose and mouth down into the lungs. A person with asthma will have times when symptoms get worse. These are called asthma attacks. They can cause coughing, whistling sounds when you breathe (wheezing), shortness of breath, and chest pain. They can make it hard to breathe. There is no cure for asthma, but medicines and lifestyle changes can help control it.  There are many things that can bring on an asthma attack or make asthma symptoms worse (triggers). Common triggers include:  · Mold.  · Dust.  · Cigarette smoke.  · Cockroaches.  · Things that can cause allergy symptoms (allergens). These include animal skin flakes (dander) and pollen from trees or grass.  · Things that pollute the air. These may include household cleaners, wood smoke, smog, or chemical odors.  · Cold air, weather changes, and wind.  · Crying or laughing hard.  · Stress.  · Certain medicines or drugs.  · Certain foods such as dried fruit, potato chips, and grape juice.  · Infections, such as a cold or the flu.  · Certain medical conditions or diseases.  · Exercise or tiring activities.  Asthma may be treated with medicines and by staying away from the things that cause asthma attacks. Types of medicines may include:  · Controller medicines. These help prevent asthma symptoms. They are usually taken every day.  · Fast-acting reliever or rescue medicines. These quickly relieve asthma symptoms. They are used as needed and provide short-term relief.  · Allergy medicines if your attacks are brought on by allergens.  · Medicines to help control the body's defense (immune) system.  Follow these instructions at home:  Avoiding triggers in your home  · Change your heating and air conditioning filter often.  · Limit your use of fireplaces and wood stoves.  · Get rid of pests (such as roaches and  mice) and their droppings.  · Throw away plants if you see mold on them.  · Clean your floors. Dust regularly. Use cleaning products that do not smell.  · Have someone vacuum when you are not home. Use a vacuum cleaner with a HEPA filter if possible.  · Replace carpet with wood, tile, or vinyl flooring. Carpet can trap animal skin flakes and dust.  · Use allergy-proof pillows, mattress covers, and box spring covers.  · Wash bed sheets and blankets every week in hot water. Dry them in a dryer.  · Keep your bedroom free of any triggers.   · Avoid pets and keep windows closed when things that cause allergy symptoms are in the air.  · Use blankets that are made of polyester or cotton.  · Clean bathrooms and kitchens with bleach. If possible, have someone repaint the walls in these rooms with mold-resistant paint. Keep out of the rooms that are being cleaned and painted.  · Wash your hands often with soap and water. If soap and water are not available, use hand sanitizer.  · Do not allow anyone to smoke in your home.  General instructions  · Take over-the-counter and prescription medicines only as told by your doctor.  ? Talk with your doctor if you have questions about how or when to take your medicines.  ? Make note if you need to use your medicines more often than usual.  · Do not use any products that   contain nicotine or tobacco, such as cigarettes and e-cigarettes. If you need help quitting, ask your doctor.  · Stay away from secondhand smoke.  · Avoid doing things outdoors when allergen counts are high and when air quality is low.  · Wear a ski mask when doing outdoor activities in the winter. The mask should cover your nose and mouth. Exercise indoors on cold days if you can.  · Warm up before you exercise. Take time to cool down after exercise.  · Use a peak flow meter as told by your doctor. A peak flow meter is a tool that measures how well the lungs are working.  · Keep track of the peak flow meter's readings.  Write them down.  · Follow your asthma action plan. This is a written plan for taking care of your asthma and treating your attacks.  · Make sure you get all the shots (vaccines) that your doctor recommends. Ask your doctor about a flu shot and a pneumonia shot.  · Keep all follow-up visits as told by your doctor. This is important.  Contact a doctor if:  · You have wheezing, shortness of breath, or a cough even while taking medicine to prevent attacks.  · The mucus you cough up (sputum) is thicker than usual.  · The mucus you cough up changes from clear or white to yellow, green, gray, or bloody.  · You have problems from the medicine you are taking, such as:  ? A rash.  ? Itching.  ? Swelling.  ? Trouble breathing.  · You need reliever medicines more than 2-3 times a week.  · Your peak flow reading is still at 50-79% of your personal best after following the action plan for 1 hour.  · You have a fever.  Get help right away if:  · You seem to be worse and are not responding to medicine during an asthma attack.  · You are short of breath even at rest.  · You get short of breath when doing very little activity.  · You have trouble eating, drinking, or talking.  · You have chest pain or tightness.  · You have a fast heartbeat.  · Your lips or fingernails start to turn blue.  · You are light-headed or dizzy, or you faint.  · Your peak flow is less than 50% of your personal best.  · You feel too tired to breathe normally.  Summary  · Asthma is a long-term (chronic) condition in which the airways get tight and narrow. An asthma attack can make it hard to breathe.  · Asthma cannot be cured, but medicines and lifestyle changes can help control it.  · Make sure you understand how to avoid triggers and how and when to use your medicines.  This information is not intended to replace advice given to you by your health care provider. Make sure you discuss any questions you have with your health care provider.  Document  Released: 05/17/2008 Document Revised: 01/03/2017 Document Reviewed: 01/03/2017  Elsevier Interactive Patient Education © 2019 [employer]

## 2019-03-22 MED FILL — !SYMBICORT 160-4.5 MCG INH: 160-4.5 | 30 days supply | Qty: 1 | Fill #0

## 2019-03-22 MED FILL — !VENTOLIN HFA INHALER: 108 (90 BAS | 25 days supply | Qty: 18 | Fill #0

## 2019-05-15 MED FILL — !VENTOLIN HFA INHALER: 108 (90 BAS | 25 days supply | Qty: 18 | Fill #1

## 2019-05-15 MED FILL — !SYMBICORT 160-4.5 MCG INH: 160-4.5 | 30 days supply | Qty: 1 | Fill #1

## 2019-06-27 MED FILL — !VENTOLIN HFA INHALER: 108 (90 BAS | 25 days supply | Qty: 18 | Fill #2

## 2019-06-27 MED FILL — SYMBICORT 160-4.5 MCG INH: 160-4.5 | 30 days supply | Qty: 10 | Fill #2

## 2019-06-29 ENCOUNTER — Emergency Department (HOSPITAL_BASED_OUTPATIENT_CLINIC_OR_DEPARTMENT_OTHER)
Admission: EM | Admit: 2019-06-29 | Discharge: 2019-06-30 | Disposition: A | Discharge status: Home / Self Care | Payer: Self-pay | Attending: Emergency Medicine | Admitting: Emergency Medicine

## 2019-06-29 ENCOUNTER — Other Ambulatory Visit: Payer: Self-pay

## 2019-06-29 ENCOUNTER — Encounter (HOSPITAL_BASED_OUTPATIENT_CLINIC_OR_DEPARTMENT_OTHER): Payer: Self-pay | Admitting: Emergency Medicine

## 2019-06-29 DIAGNOSIS — Z8541 Personal history of malignant neoplasm of cervix uteri: Secondary | ICD-10-CM | POA: Insufficient documentation

## 2019-06-29 DIAGNOSIS — J4540 Moderate persistent asthma, uncomplicated: Secondary | ICD-10-CM

## 2019-06-29 DIAGNOSIS — Z760 Encounter for issue of repeat prescription: Secondary | ICD-10-CM

## 2019-06-29 DIAGNOSIS — E039 Hypothyroidism, unspecified: Secondary | ICD-10-CM | POA: Insufficient documentation

## 2019-06-29 DIAGNOSIS — Z87891 Personal history of nicotine dependence: Secondary | ICD-10-CM | POA: Insufficient documentation

## 2019-06-29 DIAGNOSIS — Z79899 Other long term (current) drug therapy: Secondary | ICD-10-CM | POA: Insufficient documentation

## 2019-06-29 DIAGNOSIS — I10 Essential (primary) hypertension: Secondary | ICD-10-CM | POA: Insufficient documentation

## 2019-06-29 DIAGNOSIS — Z8673 Personal history of transient ischemic attack (TIA), and cerebral infarction without residual deficits: Secondary | ICD-10-CM | POA: Insufficient documentation

## 2019-06-29 MED ORDER — IPRATROPIUM BROMIDE HFA 17 MCG/ACT IN AERS
2.0000 | INHALATION_SPRAY | Freq: Once | RESPIRATORY_TRACT | Status: AC
  Administered 2019-06-29: 23:00:00 2 via RESPIRATORY_TRACT

## 2019-06-29 MED ORDER — DEXAMETHASONE SODIUM PHOSPHATE 10 MG/ML IJ SOLN
10.0000 mg | Freq: Once | INTRAMUSCULAR | Status: AC
  Administered 2019-06-29: 23:00:00 10 mg via INTRAVENOUS
  Filled 2019-06-29: qty 1

## 2019-06-29 MED ORDER — ALBUTEROL SULFATE HFA 108 (90 BASE) MCG/ACT IN AERS
8.0000 | INHALATION_SPRAY | Freq: Once | RESPIRATORY_TRACT | Status: AC
  Administered 2019-06-29: 23:00:00 8 via RESPIRATORY_TRACT
  Filled 2019-06-29: qty 6.7

## 2019-06-29 NOTE — ED Provider Notes (Signed)
[facility] Provider Note: Wendy Spurling, MD, FACEP  CSN: [id number] MRN: [id number] ARRIVAL: 06/29/19 at 2256 ROOM: [place]  06/29/19 11:00 PM Wendy Hoffman is a 53 y.o. female with a history of asthma.  She is here with an asthma exacerbation for about the past week.  She ran out of her inhaler at home nebulizer medications which caused her symptoms to worsen yesterday.  She describes her symptoms as mild but came in because she wanted to prevent exacerbation.  She denies associated fever, cough, nausea, vomiting or diarrhea.  Symptoms are worse with exertion.   Past Medical History:  Diagnosis Date  . Anemia   . Anxiety   . Arthritis   . Asthma   . Cancer ([place])    cervic  . H/O: hysterectomy   . High cholesterol   . Hypertension   . Hypothyroidism   . Insomnia   . Medical history non-contributory   . Migraine   . Morbid obesity ([place])   . Shortness of breath   . Stroke ([place])   . Thyroid disease     Past Surgical History:  Procedure Laterality Date  . ABDOMINAL HYSTERECTOMY    . CESAREAN SECTION     3 occasions  . CHOLECYSTECTOMY    . THYROIDECTOMY, PARTIAL     Goiter    Family History  Problem Relation Age of Onset  . Migraines Sister     Social History   Tobacco Use  . Smoking status: Former Smoker    Types: Cigarettes  . Smokeless tobacco: Never Used  Substance Use Topics  . Alcohol use: No  . Drug use: No    Prior to Admission medications   Medication Sig Start Date End Date Taking? Authorizing Provider  albuterol (PROVENTIL) (2.5 MG/3ML) 0.083% nebulizer solution Take 3 mLs (2.5 mg total) by nebulization every 4 (four) hours as needed for wheezing or shortness of breath. 06/30/19   Dabney Dever, MD  budesonide-formoterol (SYMBICORT) 160-4.5 MCG/ACT inhaler Inhale 2 puffs into the lungs 2 (two) times daily. 06/30/19   Clair Alfieri, MD  Ibuprofen-Diphenhydramine HCl (ADVIL  PM) 200-25 MG CAPS Take 1-2 tablets by mouth at bedtime as needed (sleep).    [provider]  fluticasone (FLONASE) 50 MCG/ACT nasal spray Place 1 spray into both nostrils daily. Patient not taking: Reported on 03/21/2019 12/29/17 06/29/19  Petrucelli, Aldona Bar R, PA-C    Allergies Blueberry [vaccinium angustifolium], Mango flavor, Aspirin, and Penicillins   REVIEW OF SYSTEMS  Negative except as noted here or in the History of Present Illness.   PHYSICAL EXAMINATION  Initial Vital Signs Blood pressure (!) 189/117, pulse (!) 112, temperature 98.3 F (36.8 C), temperature source Oral, resp. rate (!) 28, height 5\' 4"  (1.626 m), weight 127 kg, SpO2 95 %.  Examination General: Well-developed, well-nourished female in no acute distress; appearance consistent with age of record HENT: normocephalic; atraumatic Eyes: pupils equal, round and reactive to light; extraocular muscles intact Neck: supple Heart: regular rate and rhythm Lungs: Inspiratory and expiratory wheezing; mild tachypnea Abdomen: soft; nondistended; nontender; bowel sounds present Extremities: No deformity; full range of motion; pulses normal Neurologic: Awake, alert and oriented; motor function intact in all extremities and symmetric; no facial droop Skin: Warm and dry Psychiatric: Normal mood and affect   RESULTS  Summary of this visit's results, reviewed by myself:   EKG Interpretation  Date/Time:    Ventricular Rate:  PR Interval:    QRS Duration:   QT Interval:    QTC Calculation:   R Axis:     Text Interpretation:        Laboratory Studies: No results found for this or any previous visit (from the past 24 hour(s)). Imaging Studies: No results found.  ED COURSE and MDM  Nursing notes and initial vitals signs, including pulse oximetry, reviewed.  Vitals:   06/29/19 2300 06/29/19 2307 06/29/19 2342 06/30/19 0021  BP:      Pulse:   95 96  Resp:      Temp:      TempSrc:      SpO2:  96%  97% 98%  Weight: 127 kg     Height: 5\' 4"  (1.626 m)      11:18 PM Lungs clear after multiple albuterol and Atrovent inhaler treatments by respiratory therapy.  12:30 AM Lungs remain clear.  Patient ready to go home.  PROCEDURES    ED DIAGNOSES     ICD-10-CM   1. Medication refill  Z76.0   2. Moderate persistent asthma without complication  K99.83 budesonide-formoterol (SYMBICORT) 160-4.5 MCG/ACT inhaler       Pedro Whiters, MD 06/30/19 [phone]

## 2019-06-29 NOTE — ED Triage Notes (Signed)
C/o asthma exacerbation since yesterday. Pt reports running out of medications.

## 2019-06-29 NOTE — ED Notes (Signed)
ED Provider at bedside. 

## 2019-06-30 MED ORDER — ALBUTEROL SULFATE (2.5 MG/3ML) 0.083% IN NEBU: 2.5000 mg | INHALATION_SOLUTION | RESPIRATORY_TRACT | 0 refills | Status: DC | PRN

## 2019-06-30 MED ORDER — ALBUTEROL SULFATE (2.5 MG/3ML) 0.083% IN NEBU
INHALATION_SOLUTION | RESPIRATORY_TRACT | Status: AC
  Filled 2019-06-30: qty 3

## 2019-06-30 MED ORDER — BUDESONIDE-FORMOTEROL FUMARATE 160-4.5 MCG/ACT IN AERO: 2.0000 | INHALATION_SPRAY | Freq: Two times a day (BID) | RESPIRATORY_TRACT | 3 refills | Status: DC

## 2019-06-30 NOTE — ED Notes (Signed)
Pt understood dc material. NAD noted. Scripts given at [facility]. All questions answered to satisfaction. Pt escorted to check out counter.

## 2019-08-22 MED FILL — ALBUTEROL SULFATE HFA 108 (: 108 (90 BAS | 25 days supply | Qty: 18 | Fill #2

## 2019-08-22 MED FILL — SYMBICORT 160-4.5 MCG INH: 160-4.5 | 30 days supply | Qty: 10 | Fill #2

## 2019-08-26 ENCOUNTER — Encounter (HOSPITAL_BASED_OUTPATIENT_CLINIC_OR_DEPARTMENT_OTHER): Payer: Self-pay | Admitting: Emergency Medicine

## 2019-08-26 ENCOUNTER — Emergency Department (HOSPITAL_BASED_OUTPATIENT_CLINIC_OR_DEPARTMENT_OTHER)
Admission: EM | Admit: 2019-08-26 | Discharge: 2019-08-26 | Disposition: A | Discharge status: Home / Self Care | Payer: Self-pay | Attending: Emergency Medicine | Admitting: Emergency Medicine

## 2019-08-26 ENCOUNTER — Other Ambulatory Visit: Payer: Self-pay

## 2019-08-26 DIAGNOSIS — Z8541 Personal history of malignant neoplasm of cervix uteri: Secondary | ICD-10-CM | POA: Insufficient documentation

## 2019-08-26 DIAGNOSIS — J4541 Moderate persistent asthma with (acute) exacerbation: Secondary | ICD-10-CM | POA: Insufficient documentation

## 2019-08-26 DIAGNOSIS — E039 Hypothyroidism, unspecified: Secondary | ICD-10-CM | POA: Insufficient documentation

## 2019-08-26 DIAGNOSIS — I10 Essential (primary) hypertension: Secondary | ICD-10-CM | POA: Insufficient documentation

## 2019-08-26 DIAGNOSIS — Z87891 Personal history of nicotine dependence: Secondary | ICD-10-CM | POA: Insufficient documentation

## 2019-08-26 MED ORDER — ALBUTEROL SULFATE (2.5 MG/3ML) 0.083% IN NEBU: 2.5000 mg | INHALATION_SOLUTION | RESPIRATORY_TRACT | 0 refills | Status: DC | PRN

## 2019-08-26 MED ORDER — PREDNISONE 20 MG PO TABS: 60.0000 mg | ORAL_TABLET | Freq: Every day | ORAL | 0 refills | Status: DC

## 2019-08-26 MED ORDER — ALBUTEROL SULFATE HFA 108 (90 BASE) MCG/ACT IN AERS
8.0000 | INHALATION_SPRAY | Freq: Once | RESPIRATORY_TRACT | Status: AC
  Administered 2019-08-26: 8 via RESPIRATORY_TRACT
  Filled 2019-08-26: qty 6.7

## 2019-08-26 MED ORDER — PREDNISONE 50 MG PO TABS
60.0000 mg | ORAL_TABLET | Freq: Once | ORAL | Status: AC
  Administered 2019-08-26: 60 mg via ORAL
  Filled 2019-08-26: qty 1

## 2019-08-26 MED ORDER — ALBUTEROL SULFATE HFA 108 (90 BASE) MCG/ACT IN AERS: 1.0000 | INHALATION_SPRAY | Freq: Four times a day (QID) | RESPIRATORY_TRACT | 0 refills | Status: DC | PRN

## 2019-08-26 MED ORDER — ALBUTEROL SULFATE (2.5 MG/3ML) 0.083% IN NEBU: 5.0000 mg | INHALATION_SOLUTION | Freq: Once | RESPIRATORY_TRACT | Status: DC

## 2019-08-26 NOTE — ED Provider Notes (Signed)
[facility] EMERGENCY DEPARTMENT Provider Note   CSN: [id number] Arrival date & time: 08/26/19  1204     History   Chief Complaint Chief Complaint  Patient presents with  . Asthma    HPI Naiomi Hoffman is a 53 y.o. female.  She has a history of asthma.  She said she has had an active asthma exacerbation since Wednesday which acutely got worse last night.  She is out of her nebulizer and pump.  Last steroids about 2 months ago.  Quit smoking.  No fevers.  Cough productive of some scant white sputum.  Otherwise no body aches no headache.  Denies Covid exposure.     The history is provided by the patient.  Asthma This is a recurrent problem. The current episode started more than 2 days ago. The problem occurs constantly. The problem has been gradually worsening. Associated symptoms include shortness of breath. Pertinent negatives include no chest pain, no abdominal pain and no headaches. The symptoms are aggravated by walking. Nothing relieves the symptoms. She has tried nothing for the symptoms. The treatment provided no relief.    Past Medical History:  Diagnosis Date  . Anemia   . Anxiety   . Arthritis   . Asthma   . Cancer ([place])    cervic  . H/O: hysterectomy   . High cholesterol   . Hypertension   . Hypothyroidism   . Insomnia   . Medical history non-contributory   . Migraine   . Morbid obesity ([place])   . Shortness of breath   . Stroke ([place])   . Thyroid disease     Patient Active Problem List   Diagnosis Date Noted  . Snoring 05/03/2016  . Morbid obesity ([place]) 04/25/2014  . Asthma exacerbation 04/22/2014  . Asthma attack 04/22/2014  . Asthma with acute exacerbation 11/03/2013  . Hypothyroidism 11/03/2013  . Tobacco abuse 11/03/2013  . Migraine 09/22/2012  . Weakness of right side of body 09/22/2012  . Thyroid disease   . Hypertension   . High cholesterol   . H/O: hysterectomy   . Asthma 11/26/2008  . HEADACHE, CHRONIC 11/26/2008    Past  Surgical History:  Procedure Laterality Date  . ABDOMINAL HYSTERECTOMY    . CESAREAN SECTION     3 occasions  . CHOLECYSTECTOMY    . THYROIDECTOMY, PARTIAL     Goiter     OB History   No obstetric history on file.      Home Medications    Prior to Admission medications   Medication Sig Start Date End Date Taking? Authorizing Provider  albuterol (PROVENTIL) (2.5 MG/3ML) 0.083% nebulizer solution Take 3 mLs (2.5 mg total) by nebulization every 4 (four) hours as needed for wheezing or shortness of breath. 06/30/19   Molpus, John, MD  budesonide-formoterol (SYMBICORT) 160-4.5 MCG/ACT inhaler Inhale 2 puffs into the lungs 2 (two) times daily. 06/30/19   Molpus, John, MD  Ibuprofen-Diphenhydramine HCl (ADVIL PM) 200-25 MG CAPS Take 1-2 tablets by mouth at bedtime as needed (sleep).    [provider]  fluticasone (FLONASE) 50 MCG/ACT nasal spray Place 1 spray into both nostrils daily. Patient not taking: Reported on 03/21/2019 12/29/17 06/29/19  Petrucelli, Glynda Jaeger, PA-C    Family History Family History  Problem Relation Age of Onset  . Migraines Sister     Social History Social History   Tobacco Use  . Smoking status: Former Smoker    Types: Cigarettes  . Smokeless tobacco: Never Used  Substance  Use Topics  . Alcohol use: No  . Drug use: No     Allergies   Blueberry [vaccinium angustifolium], Mango flavor, Aspirin, and Penicillins   Review of Systems Review of Systems  Constitutional: Negative for fever.  HENT: Negative for sore throat.   Eyes: Negative for visual disturbance.  Respiratory: Positive for cough, shortness of breath and wheezing.   Cardiovascular: Negative for chest pain.  Gastrointestinal: Negative for abdominal pain.  Genitourinary: Negative for dysuria.  Musculoskeletal: Negative for neck pain.  Skin: Negative for rash.  Neurological: Negative for headaches.     Physical Exam Updated Vital Signs BP (!) 158/88 (BP Location: Left Arm)    Pulse 96   Temp 98.6 F (37 C) (Oral)   Resp (!) 22   Ht 5\' 4"  (1.626 m)   Wt 127 kg   SpO2 99%   BMI 48.06 kg/m   Physical Exam Vitals signs and nursing note reviewed.  Constitutional:      General: She is not in acute distress.    Appearance: She is well-developed.  HENT:     Head: Normocephalic and atraumatic.  Eyes:     Conjunctiva/sclera: Conjunctivae normal.  Neck:     Musculoskeletal: Neck supple.  Cardiovascular:     Rate and Rhythm: Normal rate and regular rhythm.     Pulses: Normal pulses.     Heart sounds: No murmur.  Pulmonary:     Effort: Tachypnea and accessory muscle usage present. No respiratory distress.     Breath sounds: Wheezing present.  Abdominal:     Palpations: Abdomen is soft.     Tenderness: There is no abdominal tenderness.  Musculoskeletal:        General: No tenderness.     Right lower leg: No edema.     Left lower leg: No edema.  Skin:    General: Skin is warm and dry.     Capillary Refill: Capillary refill takes less than 2 seconds.  Neurological:     General: No focal deficit present.     Mental Status: She is alert and oriented to person, place, and time.      ED Treatments / Results  Labs (all labs ordered are listed, but only abnormal results are displayed) Labs Reviewed - No data to display  EKG None  Radiology No results found.  Procedures Procedures (including critical care time)  Medications Ordered in ED Medications  albuterol (VENTOLIN HFA) 108 (90 Base) MCG/ACT inhaler 8 puff (has no administration in time range)  predniSONE (DELTASONE) tablet 60 mg (has no administration in time range)     Initial Impression / Assessment and Plan / ED Course  I have reviewed the triage vital signs and the nursing notes.  Pertinent labs & imaging results that were available during my care of the patient were reviewed by me and considered in my medical decision making (see chart for details).  Clinical Course as of Aug 25 1656  Sun Aug 25, 2636  [id number] 53 year old female prior smoker history of asthma complaining of increased shortness of breath since Wednesday acutely worse last night.  Here she is tachypneic and using accessory muscles but sats are 99%.  Afebrile.  Consistent with asthma exacerbation.  She is getting albuterol inhaler treatment and prednisone.  Will reevaluate after that.   [MB]  [id number] Reexamined patient, she sounds moderately improved and her wheezing.  She is comfortable with going home.  We will refill her breathing treatment medication and give her  prescription for prednisone x4 days.   [MB]    Clinical Course User Index [MB] Hayden Rasmussen, MD        Final Clinical Impressions(s) / ED Diagnoses   Final diagnoses:  Moderate persistent asthma with exacerbation    ED Discharge Orders         Ordered    albuterol (PROVENTIL) (2.5 MG/3ML) 0.083% nebulizer solution  Every 4 hours PRN    Note to Pharmacy: Dispense 30, 3 mL plastic vials.   08/26/19 1326    albuterol (VENTOLIN HFA) 108 (90 Base) MCG/ACT inhaler  Every 6 hours PRN     08/26/19 1326    predniSONE (DELTASONE) 20 MG tablet  Daily     08/26/19 1326           Hayden Rasmussen, MD 08/26/19 1657

## 2019-08-26 NOTE — ED Notes (Signed)
Pt on cardiac monitor and continuous pulse ox 

## 2019-08-26 NOTE — ED Triage Notes (Signed)
Pt here with asthma exacerbation since Wednesday. Out of all asthma meds. Audible wheezing from across the room.

## 2019-08-26 NOTE — ED Notes (Signed)
Pt verbalized understanding of dc instructions.

## 2019-08-26 NOTE — Discharge Instructions (Addendum)
You were seen in the emergency department for increased shortness of breath.  This is consistent with your asthma.  You were given a breathing treatment and steroids with improvement in your symptoms.  We are refilling your albuterol inhaler and also albuterol nebulizer solution along with 4 days of prednisone.  Please follow-up with your doctor or return if any worsening symptoms.

## 2019-09-18 ENCOUNTER — Emergency Department (HOSPITAL_BASED_OUTPATIENT_CLINIC_OR_DEPARTMENT_OTHER)
Admission: EM | Admit: 2019-09-18 | Discharge: 2019-09-19 | Disposition: A | Discharge status: Home / Self Care | Payer: Self-pay | Attending: Emergency Medicine | Admitting: Emergency Medicine

## 2019-09-18 ENCOUNTER — Emergency Department (HOSPITAL_BASED_OUTPATIENT_CLINIC_OR_DEPARTMENT_OTHER): Payer: Self-pay

## 2019-09-18 ENCOUNTER — Encounter (HOSPITAL_BASED_OUTPATIENT_CLINIC_OR_DEPARTMENT_OTHER): Payer: Self-pay | Admitting: *Deleted

## 2019-09-18 ENCOUNTER — Other Ambulatory Visit: Payer: Self-pay

## 2019-09-18 DIAGNOSIS — G43009 Migraine without aura, not intractable, without status migrainosus: Secondary | ICD-10-CM | POA: Insufficient documentation

## 2019-09-18 DIAGNOSIS — I10 Essential (primary) hypertension: Secondary | ICD-10-CM | POA: Insufficient documentation

## 2019-09-18 DIAGNOSIS — R030 Elevated blood-pressure reading, without diagnosis of hypertension: Secondary | ICD-10-CM

## 2019-09-18 DIAGNOSIS — Z87891 Personal history of nicotine dependence: Secondary | ICD-10-CM | POA: Insufficient documentation

## 2019-09-18 DIAGNOSIS — E039 Hypothyroidism, unspecified: Secondary | ICD-10-CM | POA: Insufficient documentation

## 2019-09-18 DIAGNOSIS — Z79899 Other long term (current) drug therapy: Secondary | ICD-10-CM | POA: Insufficient documentation

## 2019-09-18 DIAGNOSIS — J4541 Moderate persistent asthma with (acute) exacerbation: Secondary | ICD-10-CM | POA: Insufficient documentation

## 2019-09-18 MED ORDER — ALBUTEROL SULFATE HFA 108 (90 BASE) MCG/ACT IN AERS
8.0000 | INHALATION_SPRAY | RESPIRATORY_TRACT | Status: DC | PRN
  Administered 2019-09-18 (×2): 8 via RESPIRATORY_TRACT

## 2019-09-18 MED ORDER — ALBUTEROL SULFATE HFA 108 (90 BASE) MCG/ACT IN AERS
INHALATION_SPRAY | RESPIRATORY_TRACT | Status: AC
  Administered 2019-09-18: 23:00:00 8 via RESPIRATORY_TRACT
  Filled 2019-09-18: qty 6.7

## 2019-09-18 MED ORDER — IPRATROPIUM BROMIDE HFA 17 MCG/ACT IN AERS
INHALATION_SPRAY | RESPIRATORY_TRACT | Status: AC
  Administered 2019-09-18: 23:00:00 4
  Filled 2019-09-18: qty 12.9

## 2019-09-18 NOTE — ED Notes (Signed)
Patient transported to X-ray 

## 2019-09-18 NOTE — ED Notes (Signed)
Patient looking better but still wheezy, gave additional Prn dose of albuterol 8 puffs.

## 2019-09-18 NOTE — ED Triage Notes (Signed)
Asthma exacerbation that started today. Audible wheezing. Pt speaking in broken sentences. Reports that she is out of all meds.

## 2019-09-18 NOTE — ED Notes (Signed)
Patient unable to speak prior to treatment and 10 minutes after patient feeling some better and able to speak in complete sentences.

## 2019-09-19 MED ORDER — METHYLPREDNISOLONE SODIUM SUCC 125 MG IJ SOLR
125.0000 mg | Freq: Once | INTRAMUSCULAR | Status: AC
  Administered 2019-09-19: 125 mg via INTRAVENOUS

## 2019-09-19 MED ORDER — METOCLOPRAMIDE HCL 5 MG/ML IJ SOLN
INTRAMUSCULAR | Status: AC
  Filled 2019-09-19: qty 2

## 2019-09-19 MED ORDER — PREDNISONE 20 MG PO TABS: 60.0000 mg | ORAL_TABLET | Freq: Every day | ORAL | 0 refills | Status: DC

## 2019-09-19 MED ORDER — METOCLOPRAMIDE HCL 5 MG/ML IJ SOLN
10.0000 mg | Freq: Once | INTRAMUSCULAR | Status: AC
  Administered 2019-09-19: 02:00:00 10 mg via INTRAVENOUS

## 2019-09-19 MED ORDER — METHYLPREDNISOLONE SODIUM SUCC 125 MG IJ SOLR
INTRAMUSCULAR | Status: AC
  Filled 2019-09-19: qty 2

## 2019-09-19 MED ORDER — ALBUTEROL SULFATE (2.5 MG/3ML) 0.083% IN NEBU: 2.5000 mg | INHALATION_SOLUTION | RESPIRATORY_TRACT | 0 refills | Status: DC | PRN

## 2019-09-19 MED ORDER — ALBUTEROL SULFATE HFA 108 (90 BASE) MCG/ACT IN AERS
6.0000 | INHALATION_SPRAY | Freq: Once | RESPIRATORY_TRACT | Status: AC
  Administered 2019-09-19: 6 via RESPIRATORY_TRACT

## 2019-09-19 MED ORDER — DIPHENHYDRAMINE HCL 50 MG/ML IJ SOLN
INTRAMUSCULAR | Status: AC
  Filled 2019-09-19: qty 1

## 2019-09-19 MED ORDER — ALBUTEROL SULFATE HFA 108 (90 BASE) MCG/ACT IN AERS: 1.0000 | INHALATION_SPRAY | Freq: Four times a day (QID) | RESPIRATORY_TRACT | 0 refills | Status: DC | PRN

## 2019-09-19 MED ORDER — SODIUM CHLORIDE 0.9 % IV BOLUS
1000.0000 mL | Freq: Once | INTRAVENOUS | Status: AC
  Administered 2019-09-19: 02:00:00 1000 mL via INTRAVENOUS

## 2019-09-19 MED ORDER — DIPHENHYDRAMINE HCL 50 MG/ML IJ SOLN
25.0000 mg | Freq: Once | INTRAMUSCULAR | Status: AC
  Administered 2019-09-19: 25 mg via INTRAVENOUS

## 2019-09-19 MED ORDER — HYDROCODONE-ACETAMINOPHEN 5-325 MG PO TABS
1.0000 | ORAL_TABLET | Freq: Once | ORAL | Status: AC
  Administered 2019-09-19: 03:00:00 1 via ORAL
  Filled 2019-09-19: qty 1

## 2019-09-19 NOTE — ED Notes (Signed)
Mild exp wheezes. Able to speak full sentences. No acute respiratory distress.

## 2019-09-19 NOTE — ED Notes (Signed)
IVF with small infiltration. Pt does not want another IV. MD informed.

## 2019-09-19 NOTE — ED Provider Notes (Signed)
[facility] EMERGENCY DEPARTMENT Provider Note   CSN: [id number] Arrival date & time: 09/18/19  2231    History   Chief Complaint Chief Complaint  Patient presents with  . Asthma    HPI Wendy Hoffman is a 53 y.o. female.   The history is provided by the patient.  She has history of asthma, hypertension, hyperlipidemia, migraines, stroke, cervical cancer and comes in complaining of asthma exacerbation.  She started having some wheezing this morning.  She used her inhaler which gave her some relief, but that was the last medicine that was in her inhaler.  She has a nebulizer which is also out of medication.  She does have a cough which is nonproductive.  She denies fever chills or sweats but does have some body aches.  She denies exposure to COVID-19.  This evening, she started developing a headache typical of her migraines.  She states it feels like her brain is exploding.  There is associated nausea some blurring of vision.  She denies weakness, numbness, tingling.  Headache is rated at 8/10.  Past Medical History:  Diagnosis Date  . Anemia   . Anxiety   . Arthritis   . Asthma   . Cancer ([place])    cervic  . H/O: hysterectomy   . High cholesterol   . Hypertension   . Hypothyroidism   . Insomnia   . Medical history non-contributory   . Migraine   . Morbid obesity ([place])   . Shortness of breath   . Stroke ([place])   . Thyroid disease     Patient Active Problem List   Diagnosis Date Noted  . Snoring 05/03/2016  . Morbid obesity ([place]) 04/25/2014  . Asthma exacerbation 04/22/2014  . Asthma attack 04/22/2014  . Asthma with acute exacerbation 11/03/2013  . Hypothyroidism 11/03/2013  . Tobacco abuse 11/03/2013  . Migraine 09/22/2012  . Weakness of right side of body 09/22/2012  . Thyroid disease   . Hypertension   . High cholesterol   . H/O: hysterectomy   . Asthma 11/26/2008  . HEADACHE, CHRONIC 11/26/2008    Past Surgical History:  Procedure Laterality  Date  . ABDOMINAL HYSTERECTOMY    . CESAREAN SECTION     3 occasions  . CHOLECYSTECTOMY    . THYROIDECTOMY, PARTIAL     Goiter     OB History   No obstetric history on file.      Home Medications    Prior to Admission medications   Medication Sig Start Date End Date Taking? Authorizing Provider  albuterol (PROVENTIL) (2.5 MG/3ML) 0.083% nebulizer solution Take 3 mLs (2.5 mg total) by nebulization every 4 (four) hours as needed for wheezing or shortness of breath. 08/26/19   Hayden Rasmussen, MD  albuterol (VENTOLIN HFA) 108 (90 Base) MCG/ACT inhaler Inhale 1-2 puffs into the lungs every 6 (six) hours as needed for wheezing or shortness of breath. 08/26/19   Hayden Rasmussen, MD  budesonide-formoterol [facility]) 160-4.5 MCG/ACT inhaler Inhale 2 puffs into the lungs 2 (two) times daily. 06/30/19   Molpus, John, MD  Ibuprofen-Diphenhydramine HCl (ADVIL PM) 200-25 MG CAPS Take 1-2 tablets by mouth at bedtime as needed (sleep).    [provider]  predniSONE (DELTASONE) 20 MG tablet Take 3 tablets (60 mg total) by mouth daily. 08/26/19   Hayden Rasmussen, MD  fluticasone (FLONASE) 50 MCG/ACT nasal spray Place 1 spray into both nostrils daily. Patient not taking: Reported on 03/21/2019 12/29/17 06/29/19  Petrucelli, Aldona Bar  R, PA-C    Family History Family History  Problem Relation Age of Onset  . Migraines Sister     Social History Social History   Tobacco Use  . Smoking status: Former Smoker    Types: Cigarettes  . Smokeless tobacco: Never Used  Substance Use Topics  . Alcohol use: No  . Drug use: No     Allergies   Blueberry [vaccinium angustifolium], Mango flavor, Aspirin, and Penicillins   Review of Systems Review of Systems  All other systems reviewed and are negative.    Physical Exam Updated Vital Signs BP (!) 157/104   Pulse (!) 101   Temp 98.2 F (36.8 C) (Oral)   Resp 20   Ht 5\' 1"  (1.549 m)   Wt 127 kg   SpO2 97%   BMI 52.91 kg/m    Physical Exam Vitals signs and nursing note reviewed.    53 year old female, resting comfortably and in no acute distress. Vital signs are significant for elevated blood pressure and borderline elevated heart rate. Oxygen saturation is 97%, which is normal. Head is normocephalic and atraumatic. PERRLA, EOMI. Oropharynx is clear. Neck is nontender and supple without adenopathy or JVD. Back is nontender and there is no CVA tenderness. Lungs have coarse expiratory wheezes diffusely without rales or rhonchi. Chest is nontender. Heart has regular rate and rhythm without murmur. Abdomen is soft, flat, nontender without masses or hepatosplenomegaly and peristalsis is normoactive. Extremities have no cyanosis or edema, full range of motion is present. Skin is warm and dry without rash. Neurologic: Mental status is normal, cranial nerves are intact, there are no motor or sensory deficits.  ED Treatments / Results   Radiology Dg Chest 2 View  Result Date: 09/18/2019 CLINICAL DATA:  Shortness of breath.  Asthma exacerbation EXAM: CHEST - 2 VIEW COMPARISON:  09/15/2018 FINDINGS: The heart size and mediastinal contours are within normal limits. Both lungs are clear. The visualized skeletal structures are unremarkable. IMPRESSION: No active cardiopulmonary disease. Electronically Signed   By: Ulyses Jarred M.D.   On: 09/18/2019 23:34    Procedures Procedures  Medications Ordered in ED Medications  albuterol (VENTOLIN HFA) 108 (90 Base) MCG/ACT inhaler 8 puff (8 puffs Inhalation Given 09/18/19 2348)  HYDROcodone-acetaminophen (NORCO/VICODIN) 5-325 MG per tablet 1 tablet (has no administration in time range)  ipratropium (ATROVENT HFA) 17 MCG/ACT inhaler (4 puffs  Given 09/18/19 2250)  sodium chloride 0.9 % bolus 1,000 mL ( Intravenous Stopped 09/19/19 0140)  metoCLOPramide (REGLAN) injection 10 mg (10 mg Intravenous Given 09/19/19 0139)  diphenhydrAMINE (BENADRYL) injection 25 mg (25 mg Intravenous  Given 09/19/19 0139)  methylPREDNISolone sodium succinate (SOLU-MEDROL) 125 mg/2 mL injection 125 mg (125 mg Intravenous Given 09/19/19 0138)  albuterol (VENTOLIN HFA) 108 (90 Base) MCG/ACT inhaler 6 puff (6 puffs Inhalation Given 09/19/19 0156)     Initial Impression / Assessment and Plan / ED Course  I have reviewed the triage vital signs and the nursing notes.  Pertinent labs & imaging results that were available during my care of the patient were reviewed by me and considered in my medical decision making (see chart for details).  Asthma exacerbation.  Old records are reviewed showing multiple ED visits for asthma as well as several hospitalizations for asthma.  Headache which is probably a migraine.  She will be given a migraine cocktail of normal saline, metoclopramide, diphenhydramine and will be given methylprednisolone for her asthma as well as albuterol via inhaler.  She feels much better  after above-noted treatment.  On reexam, lungs are clear.  She is discharged with prescriptions for albuterol inhaler and albuterol solution for nebulizer as well as prescription for short course of prednisone.  Final Clinical Impressions(s) / ED Diagnoses   Final diagnoses:  Moderate persistent asthma with exacerbation  Migraine without aura and without status migrainosus, not intractable  Elevated blood pressure reading without diagnosis of hypertension    ED Discharge Orders         Ordered    albuterol (VENTOLIN HFA) 108 (90 Base) MCG/ACT inhaler  Every 6 hours PRN     09/19/19 0123    albuterol (PROVENTIL) (2.5 MG/3ML) 0.083% nebulizer solution  Every 4 hours PRN    Note to Pharmacy: Dispense 30, 3 mL plastic vials.   09/19/19 0123    predniSONE (DELTASONE) 20 MG tablet  Daily     09/19/19 [id number]           Delora Fuel, MD [id number] [phone]

## 2019-10-17 MED FILL — ALBUTEROL SUL 2.5 MG/3 ML S: (2.5 MG/3ML | 5 days supply | Qty: 75 | Fill #0

## 2019-10-17 MED FILL — SYMBICORT 160-4.5 MCG INH: 160-4.5 | 30 days supply | Qty: 10 | Fill #3

## 2019-10-17 MED FILL — !VENTOLIN HFA INHALER: 108 (90 BAS | 25 days supply | Qty: 18 | Fill #2

## 2019-10-25 ENCOUNTER — Other Ambulatory Visit: Payer: Self-pay | Admitting: Physician Assistant

## 2019-10-25 ENCOUNTER — Ambulatory Visit: Payer: Self-pay | Attending: Family Medicine | Admitting: Physician Assistant

## 2019-10-25 ENCOUNTER — Other Ambulatory Visit: Payer: Self-pay

## 2019-10-25 DIAGNOSIS — Z8639 Personal history of other endocrine, nutritional and metabolic disease: Secondary | ICD-10-CM

## 2019-10-25 DIAGNOSIS — J4540 Moderate persistent asthma, uncomplicated: Secondary | ICD-10-CM

## 2019-10-25 DIAGNOSIS — N6320 Unspecified lump in the left breast, unspecified quadrant: Secondary | ICD-10-CM

## 2019-10-25 DIAGNOSIS — N63 Unspecified lump in unspecified breast: Secondary | ICD-10-CM

## 2019-10-25 DIAGNOSIS — R030 Elevated blood-pressure reading, without diagnosis of hypertension: Secondary | ICD-10-CM

## 2019-10-25 DIAGNOSIS — E876 Hypokalemia: Secondary | ICD-10-CM

## 2019-10-25 DIAGNOSIS — R252 Cramp and spasm: Secondary | ICD-10-CM

## 2019-10-25 MED ORDER — ALBUTEROL SULFATE HFA 108 (90 BASE) MCG/ACT IN AERS: 1.0000 | INHALATION_SPRAY | Freq: Four times a day (QID) | RESPIRATORY_TRACT | 1 refills | Status: DC | PRN

## 2019-10-25 MED ORDER — BUDESONIDE-FORMOTEROL FUMARATE 160-4.5 MCG/ACT IN AERO: 2.0000 | INHALATION_SPRAY | Freq: Two times a day (BID) | RESPIRATORY_TRACT | 2 refills | Status: DC

## 2019-10-25 MED ORDER — BUDESONIDE-FORMOTEROL FUMARATE 160-4.5 MCG/ACT IN AERO: 2.0000 | INHALATION_SPRAY | Freq: Two times a day (BID) | RESPIRATORY_TRACT | 3 refills | Status: DC

## 2019-10-25 MED ORDER — ALBUTEROL SULFATE HFA 108 (90 BASE) MCG/ACT IN AERS: 1.0000 | INHALATION_SPRAY | Freq: Four times a day (QID) | RESPIRATORY_TRACT | 0 refills | Status: DC | PRN

## 2019-10-25 NOTE — Progress Notes (Signed)
Virtual Visit via Telephone Note  I connected with [name] on 10/25/19 at  3:50 PM EST by telephone and verified that I am speaking with the correct person using two identifiers.   I discussed the limitations, risks, security and privacy concerns of performing an evaluation and management service by telephone and the availability of in person appointments. I also discussed with the patient that there may be a patient responsible charge related to this service. The patient expressed understanding and agreed to proceed.  Patient location:  home My Location:  [place] office Persons on the call:  Me and the patient  History of Present Illness:  Patient c/o small lump in her L breast she noticed a few days ago.  No weight loss.  No change in appetite.  Non-tender.  No mammogram in several years.  No FH breast CA  Also needs RF of asthma inhalers.  No fever.  No respiratory distress.  Hasn't had insurance for a while so hasn't been able to consistently get meds.    Also c/o all over muscle body cramping that is random and occurs periodically without a pattern.  H/o hypokalemia in 12/2018.    Also h/o htn and hasn't been on meds for a while.  Hasn't checked BP.  Unsure of the name of the med she took previously.  Denies HA/CP/dizziness    Observations/Objective:  NAD.  A&Ox3   Assessment and Plan: 1. Moderate persistent asthma without complication - albuterol (VENTOLIN HFA) 108 (90 Base) MCG/ACT inhaler; Inhale 1-2 puffs into the lungs every 6 (six) hours as needed for wheezing or shortness of breath.  Dispense: 18 g; Refill: 0 - budesonide-formoterol (SYMBICORT) 160-4.5 MCG/ACT inhaler; Inhale 2 puffs into the lungs 2 (two) times daily.  Dispense: 1 Inhaler; Refill: 3  2. Breast lump L breast-will order mmg for both - MM Digital Diagnostic Bilat; Future  3. Elevated blood pressure reading Check BP 3-4 times weekly and record and bring to next visit  4. Hypokalemia - Basic metabolic  panel; Future - Magnesium; Future  5. Muscle cramping Increase water intake - Magnesium; Future  6. H/o hypothyroidism-check TSH with other labs   Follow Up Instructions: Assign PCP in 4-6 weeks   I discussed the assessment and treatment plan with the patient. The patient was provided an opportunity to ask questions and all were answered. The patient agreed with the plan and demonstrated an understanding of the instructions.   The patient was advised to call back or seek an in-person evaluation if the symptoms worsen or if the condition fails to improve as anticipated.  I provided 15 minutes of non-face-to-face time during this encounter.   Freeman Caldron, PA-C  Patient ID: Wendy Hoffman, female   DOB: February 12, 1966, 53 y.o.   MRN: [id number]

## 2019-10-29 ENCOUNTER — Telehealth (HOSPITAL_COMMUNITY): Payer: Self-pay

## 2019-10-29 NOTE — Telephone Encounter (Signed)
Telephoned patient at home number. Left a message to call and schedule appointment with [facility].

## 2019-11-01 ENCOUNTER — Other Ambulatory Visit: Payer: Self-pay

## 2019-11-01 ENCOUNTER — Ambulatory Visit: Payer: Self-pay | Attending: Family Medicine

## 2019-11-01 DIAGNOSIS — E876 Hypokalemia: Secondary | ICD-10-CM

## 2019-11-01 DIAGNOSIS — Z8639 Personal history of other endocrine, nutritional and metabolic disease: Secondary | ICD-10-CM

## 2019-11-01 DIAGNOSIS — R252 Cramp and spasm: Secondary | ICD-10-CM

## 2019-11-02 LAB — BASIC METABOLIC PANEL
BUN/Creatinine Ratio: 15 (ref 9–23)
BUN: 12 mg/dL (ref 6–24)
CO2: 23 mmol/L (ref 20–29)
Calcium: 10.1 mg/dL (ref 8.7–10.2)
Chloride: 104 mmol/L (ref 96–106)
Creatinine, Ser: 0.79 mg/dL (ref 0.57–1.00)
GFR calc Af Amer: 100 mL/min/{1.73_m2} (ref >59)
GFR calc non Af Amer: 87 mL/min/{1.73_m2} (ref >59)
Glucose: 90 mg/dL (ref 65–99)
Potassium: 4.9 mmol/L (ref 3.5–5.2)
Sodium: 141 mmol/L (ref 134–144)

## 2019-11-02 LAB — TSH: TSH: 2.18 u[IU]/mL (ref 0.450–4.500)

## 2019-11-02 LAB — MAGNESIUM: Magnesium: 2.1 mg/dL (ref 1.6–2.3)

## 2019-11-19 ENCOUNTER — Other Ambulatory Visit: Payer: Self-pay

## 2019-11-19 ENCOUNTER — Encounter: Payer: Self-pay | Admitting: Family Medicine

## 2019-11-19 ENCOUNTER — Ambulatory Visit: Payer: Self-pay | Attending: Family Medicine | Admitting: Family Medicine

## 2019-11-19 VITALS — BP 170/110 | HR 87 | Temp 98.0°F | Ht 61.0 in | Wt 292.0 lb

## 2019-11-19 DIAGNOSIS — I10 Essential (primary) hypertension: Secondary | ICD-10-CM

## 2019-11-19 DIAGNOSIS — G43709 Chronic migraine without aura, not intractable, without status migrainosus: Secondary | ICD-10-CM

## 2019-11-19 DIAGNOSIS — Z131 Encounter for screening for diabetes mellitus: Secondary | ICD-10-CM

## 2019-11-19 DIAGNOSIS — R252 Cramp and spasm: Secondary | ICD-10-CM

## 2019-11-19 DIAGNOSIS — L603 Nail dystrophy: Secondary | ICD-10-CM

## 2019-11-19 LAB — POCT GLYCOSYLATED HEMOGLOBIN (HGB A1C): HbA1c, POC (prediabetic range): 5.9 % (ref 5.7–6.4)

## 2019-11-19 MED ORDER — AMLODIPINE BESYLATE 10 MG PO TABS: 10.0000 mg | ORAL_TABLET | Freq: Every day | ORAL | 3 refills | Status: DC

## 2019-11-19 MED ORDER — TOPIRAMATE 50 MG PO TABS: 50.0000 mg | ORAL_TABLET | Freq: Two times a day (BID) | ORAL | 3 refills | Status: DC

## 2019-11-19 MED FILL — TOPIRAMATE 50 MG TABLET: 50 | 30 days supply | Qty: 60 | Fill #0

## 2019-11-19 MED FILL — AMLODIPINE BESYLATE 10 MG T: 10 | 30 days supply | Qty: 30 | Fill #0

## 2019-11-19 NOTE — Progress Notes (Signed)
Patient needs FMLA paperwork filled out fr her migraines.

## 2019-11-19 NOTE — Progress Notes (Signed)
Subjective:  Patient ID: Wendy Hoffman, female    DOB: 1966/10/01  Age: 53 y.o. MRN: [id number]  CC: Migraine   HPI Wendy Hoffman is a 53 year old female with history of morbid obesity who presents today for completion of FMLA paperwork for her migraines.  These occur about twice a week and could last anywhere from 3 hours to a whole day.  This affects her work as she works from home.  They are sometimes associated with photophobia, phonophobia, nausea, nosebleed.  She sometimes has an all which she describes as the smell of vinegar. She is currently not on medications but has used Topamax in the past with improvement but Imitrex and Maxalt were ineffective. Her blood pressure is elevated today and she is not on any antihypertensive; also endorses elevated blood pressures outside of the office. She complains of intermittent muscle spasms in her neck, lower back which are not precipitated by movement.  Use of mustard has been ineffective.  Past Medical History:  Diagnosis Date  . Anemia   . Anxiety   . Arthritis   . Asthma   . Cancer ([place])    cervic  . H/O: hysterectomy   . High cholesterol   . Hypertension   . Hypothyroidism   . Insomnia   . Medical history non-contributory   . Migraine   . Morbid obesity ([place])   . Shortness of breath   . Stroke ([place])   . Thyroid disease     Past Surgical History:  Procedure Laterality Date  . ABDOMINAL HYSTERECTOMY    . CESAREAN SECTION     3 occasions  . CHOLECYSTECTOMY    . THYROIDECTOMY, PARTIAL     Goiter    Family History  Problem Relation Age of Onset  . Migraines Sister     Allergies  Allergen Reactions  . Blueberry [Vaccinium Angustifolium] Anaphylaxis  . Mango Flavor Anaphylaxis  . Aspirin Rash  . Penicillins Hives    Has patient had a PCN reaction causing immediate rash, facial/tongue/throat swelling, SOB or lightheadedness with hypotension: Yes Has patient had a PCN reaction causing severe rash involving mucus  membranes or skin necrosis: No Has patient had a PCN reaction that required hospitalization No Has patient had a PCN reaction occurring within the last 10 years: No If all of the above answers are "NO", then may proceed with Cephalosporin use.     Outpatient Medications Prior to Visit  Medication Sig Dispense Refill  . albuterol (PROVENTIL) (2.5 MG/3ML) 0.083% nebulizer solution Take 3 mLs (2.5 mg total) by nebulization every 4 (four) hours as needed for wheezing or shortness of breath. 75 mL 0  . albuterol (VENTOLIN HFA) 108 (90 Base) MCG/ACT inhaler Inhale 1-2 puffs into the lungs every 6 (six) hours as needed for wheezing or shortness of breath. 18 g 1  . budesonide-formoterol (SYMBICORT) 160-4.5 MCG/ACT inhaler Inhale 2 puffs into the lungs 2 (two) times daily. 1 Inhaler 2  . Ibuprofen-Diphenhydramine HCl (ADVIL PM) 200-25 MG CAPS Take 1-2 tablets by mouth at bedtime as needed (sleep).     No facility-administered medications prior to visit.      ROS Review of Systems  Constitutional: Negative for activity change, appetite change and fatigue.  HENT: Negative for congestion, sinus pressure and sore throat.   Eyes: Negative for visual disturbance.  Respiratory: Negative for cough, chest tightness, shortness of breath and wheezing.   Cardiovascular: Negative for chest pain and palpitations.  Gastrointestinal: Negative for abdominal distention, abdominal pain and constipation.  Endocrine: Negative for polydipsia.  Genitourinary: Negative for dysuria and frequency.  Musculoskeletal: Negative for arthralgias and back pain.  Skin: Negative for rash.  Neurological: Negative for tremors, light-headedness and numbness.  Hematological: Does not bruise/bleed easily.  Psychiatric/Behavioral: Negative for agitation and behavioral problems.    Objective:  BP (!) 170/110   Pulse 87   Temp 98 F (36.7 C) (Oral)   Ht 5\' 1"  (1.549 m)   Wt 292 lb (132.5 kg)   SpO2 99%   BMI 55.17 kg/m    BP/Weight 11/19/2019 09/19/2019 [id number]  Systolic BP [id number] 0000000 -  Diastolic BP A999333 90 -  Wt. (Lbs) 292 - 280  BMI 55.17 - 52.91      Physical Exam Constitutional:      Appearance: She is well-developed.  Neck:     Vascular: No JVD.  Cardiovascular:     Rate and Rhythm: Normal rate.     Heart sounds: Normal heart sounds. No murmur.  Pulmonary:     Effort: Pulmonary effort is normal.     Breath sounds: Normal breath sounds. No wheezing or rales.  Chest:     Chest wall: No tenderness.  Abdominal:     General: Bowel sounds are normal. There is no distension.     Palpations: Abdomen is soft. There is no mass.     Tenderness: There is no abdominal tenderness.  Musculoskeletal: Normal range of motion.     Right lower leg: No edema.     Left lower leg: No edema.  Skin:    Comments: L discolored fingernail  Neurological:     Mental Status: She is alert and oriented to person, place, and time.  Psychiatric:        Mood and Affect: Mood normal.     CMP Latest Ref Rng & Units 11/01/2019 12/18/2018 09/15/2018  Glucose 65 - 99 mg/dL 90 127(H) 99  BUN 6 - 24 mg/dL 12 10 7   Creatinine 0.57 - 1.00 mg/dL 0.79 0.62 0.80  Sodium 134 - 144 mmol/L 141 140 139  Potassium 3.5 - 5.2 mmol/L 4.9 3.2(L) 3.2(L)  Chloride 96 - 106 mmol/L 104 108 106  CO2 20 - 29 mmol/L 23 24 25   Calcium 8.7 - 10.2 mg/dL 10.1 8.7(L) 8.6(L)  Total Protein 6.5 - 8.1 g/dL - - 6.8  Total Bilirubin 0.3 - 1.2 mg/dL - - 0.3  Alkaline Phos 38 - 126 U/L - - 60  AST 15 - 41 U/L - - 15  ALT 0 - 44 U/L - - 10    Lipid Panel     Component Value Date/Time   CHOL  10/15/2008 0535    137        ATP III CLASSIFICATION:  <200     mg/dL   Desirable  200-239  mg/dL   Borderline High  >=240    mg/dL   High   TRIG 155 (H) 10/15/2008 0535   HDL 35 (L) 10/15/2008 0535   CHOLHDL 3.9 10/15/2008 0535   VLDL 31 10/15/2008 0535   LDLCALC  10/15/2008 0535    71        Total Cholesterol/HDL:CHD Risk Coronary Heart Disease Risk  Table                     Men   Women  1/2 Average Risk   3.4   3.3    CBC    Component Value Date/Time   WBC 7.2 12/18/2018 1412   RBC  4.39 12/18/2018 1412   HGB 11.8 (L) 12/18/2018 1412   HCT 37.8 12/18/2018 1412   PLT 301 12/18/2018 1412   MCV 86.1 12/18/2018 1412   MCH 26.9 12/18/2018 1412   MCHC 31.2 12/18/2018 1412   RDW 15.5 12/18/2018 1412   LYMPHSABS 1.0 12/18/2018 1412   MONOABS 0.2 12/18/2018 1412   EOSABS 0.4 12/18/2018 1412   BASOSABS 0.0 12/18/2018 1412     Lab Results  Component Value Date   HGBA1C 5.9 11/19/2019    Assessment & Plan:   1. Essential hypertension New diagnosis Commenced on amlodipine Counseled on blood pressure goal of less than 130/80, low-sodium, DASH diet, medication compliance, 150 minutes of moderate intensity exercise per week. Discussed medication compliance, adverse effects. - amLODipine (NORVASC) 10 MG tablet; Take 1 tablet (10 mg total) by mouth daily.  Dispense: 30 tablet; Refill: 3  2. Screening for diabetes mellitus A1c of 5.7  3. Chronic migraine without aura without status migrainosus, not intractable Uncontrolled Initiated Topamax Breakthrough medications like Maxalt and Imitrex were ineffective in the past FMLA paperwork completed Use Excedrin migraines if symptoms persist - topiramate (TOPAMAX) 50 MG tablet; Take 1 tablet (50 mg total) by mouth 2 (two) times daily.  Dispense: 60 tablet; Refill: 3  4. Dystrophic nail Referred to podiatry  5. Muscle cramping Last potassium level was normal Comments on Flexeril Advised weight loss to be beneficial   Health Care Maintenance: Will address at next visit Meds ordered this encounter  Medications  . topiramate (TOPAMAX) 50 MG tablet    Sig: Take 1 tablet (50 mg total) by mouth 2 (two) times daily.    Dispense:  60 tablet    Refill:  3  . amLODipine (NORVASC) 10 MG tablet    Sig: Take 1 tablet (10 mg total) by mouth daily.    Dispense:  30 tablet    Refill:  3     Follow-up: Return in about 3 months (around 02/17/2020) for medical conditions.       Charlott Rakes, MD, FAAFP. [facility] and [facility] [place], [place]   11/19/2019, 3:37 PM

## 2019-11-19 NOTE — Patient Instructions (Signed)

## 2019-11-20 MED ORDER — CYCLOBENZAPRINE HCL 10 MG PO TABS: 10.0000 mg | ORAL_TABLET | Freq: Three times a day (TID) | ORAL | 0 refills | Status: DC | PRN

## 2019-11-20 MED FILL — CYCLOBENZAPRINE 10 MG TAB: 10 | 10 days supply | Qty: 30 | Fill #0

## 2019-11-22 MED FILL — SYMBICORT 160-4.5 MCG INH: 160-4.5 | 30 days supply | Qty: 6 | Fill #0

## 2019-11-22 MED FILL — ALBUTEROL SULFATE HFA 108 (: 108 (90 BAS | 25 days supply | Qty: 18 | Fill #0

## 2019-12-26 ENCOUNTER — Other Ambulatory Visit: Payer: Self-pay | Admitting: Family Medicine

## 2019-12-26 MED FILL — AMLODIPINE BESYLATE 10 MG T: 10 | 30 days supply | Qty: 30 | Fill #1

## 2019-12-26 MED FILL — SYMBICORT 160-4.5 MCG INH: 160-4.5 | 30 days supply | Qty: 10 | Fill #1

## 2019-12-26 MED FILL — !VENTOLIN HFA INHALER: 108 (90 BAS | 25 days supply | Qty: 18 | Fill #1

## 2019-12-26 MED FILL — TOPIRAMATE 50 MG TABLET: 50 | 30 days supply | Qty: 60 | Fill #1

## 2019-12-27 MED FILL — CYCLOBENZAPRINE 10 MG TAB: 10 | 10 days supply | Qty: 30 | Fill #0

## 2020-01-30 MED FILL — TOPIRAMATE 50 MG TABLET: 50 | 30 days supply | Qty: 60 | Fill #2

## 2020-01-30 MED FILL — CYCLOBENZAPRINE 10 MG TAB: 10 | 10 days supply | Qty: 30 | Fill #1

## 2020-01-30 MED FILL — SYMBICORT 160-4.5 MCG INH: 160-4.5 | 30 days supply | Qty: 10 | Fill #2

## 2020-01-30 MED FILL — AMLODIPINE BESYLATE 10 MG T: 10 | 30 days supply | Qty: 30 | Fill #2

## 2020-01-30 MED FILL — ALBUTEROL SULFATE HFA 108 (: 108 (90 BAS | 25 days supply | Qty: 18 | Fill #3

## 2020-02-08 MED FILL — CYCLOBENZAPRINE 10 MG TAB: 10 | 10 days supply | Qty: 30 | Fill #1

## 2020-02-08 MED FILL — SYMBICORT 160-4.5 MCG INH: 160-4.5 | 30 days supply | Qty: 10 | Fill #2

## 2020-02-08 MED FILL — AMLODIPINE BESYLATE 10 MG T: 10 | 30 days supply | Qty: 30 | Fill #2

## 2020-02-08 MED FILL — ALBUTEROL SULFATE HFA 108 (: 108 (90 BAS | 25 days supply | Qty: 18 | Fill #3

## 2020-02-08 MED FILL — TOPIRAMATE 50 MG TABLET: 50 | 30 days supply | Qty: 60 | Fill #2

## 2020-02-19 ENCOUNTER — Ambulatory Visit: Payer: Self-pay | Admitting: Family Medicine

## 2020-03-07 ENCOUNTER — Other Ambulatory Visit: Payer: Self-pay | Admitting: Physician Assistant

## 2020-03-07 ENCOUNTER — Other Ambulatory Visit: Payer: Self-pay | Admitting: Family Medicine

## 2020-03-07 DIAGNOSIS — J4540 Moderate persistent asthma, uncomplicated: Secondary | ICD-10-CM

## 2020-03-07 MED FILL — SYMBICORT 160-4.5 MCG INH: 160-4.5 | 30 days supply | Qty: 10 | Fill #0

## 2020-03-07 MED FILL — CYCLOBENZAPRINE 10 MG TAB: 10 | 10 days supply | Qty: 30 | Fill #2

## 2020-03-07 MED FILL — TOPIRAMATE 50 MG TABLET: 50 | 30 days supply | Qty: 60 | Fill #3

## 2020-03-07 MED FILL — AMLODIPINE BESYLATE 10 MG T: 10 | 30 days supply | Qty: 30 | Fill #3

## 2020-03-07 MED FILL — ALBUTEROL SULFATE HFA 108 (: 108 (90 BAS | 25 days supply | Qty: 18 | Fill #0

## 2020-03-18 MED FILL — AMLODIPINE BESYLATE 10 MG T: 10 | 30 days supply | Qty: 30 | Fill #3

## 2020-03-18 MED FILL — TOPIRAMATE 50 MG TABLET: 50 | 30 days supply | Qty: 60 | Fill #3

## 2020-03-18 MED FILL — ALBUTEROL SULFATE HFA 108 (: 108 (90 BAS | 25 days supply | Qty: 18 | Fill #0

## 2020-03-18 MED FILL — SYMBICORT 160-4.5 MCG INH: 160-4.5 | 30 days supply | Qty: 10 | Fill #0

## 2020-03-18 MED FILL — CYCLOBENZAPRINE 10 MG TAB: 10 | 10 days supply | Qty: 30 | Fill #2

## 2020-05-30 ENCOUNTER — Other Ambulatory Visit: Payer: Self-pay | Admitting: Physician Assistant

## 2020-05-30 MED FILL — SYMBICORT 160-4.5 MCG INH: 160-4.5 | 30 days supply | Qty: 10 | Fill #0

## 2020-07-21 ENCOUNTER — Encounter (HOSPITAL_BASED_OUTPATIENT_CLINIC_OR_DEPARTMENT_OTHER): Payer: Self-pay | Admitting: *Deleted

## 2020-07-21 ENCOUNTER — Other Ambulatory Visit: Payer: Self-pay

## 2020-07-21 ENCOUNTER — Emergency Department (HOSPITAL_BASED_OUTPATIENT_CLINIC_OR_DEPARTMENT_OTHER): Payer: Self-pay

## 2020-07-21 ENCOUNTER — Emergency Department (HOSPITAL_BASED_OUTPATIENT_CLINIC_OR_DEPARTMENT_OTHER)
Admission: EM | Admit: 2020-07-21 | Discharge: 2020-07-21 | Disposition: A | Discharge status: Home / Self Care | Payer: Self-pay | Attending: Emergency Medicine | Admitting: Emergency Medicine

## 2020-07-21 DIAGNOSIS — Z7951 Long term (current) use of inhaled steroids: Secondary | ICD-10-CM | POA: Insufficient documentation

## 2020-07-21 DIAGNOSIS — R519 Headache, unspecified: Secondary | ICD-10-CM | POA: Insufficient documentation

## 2020-07-21 DIAGNOSIS — I10 Essential (primary) hypertension: Secondary | ICD-10-CM | POA: Insufficient documentation

## 2020-07-21 DIAGNOSIS — R112 Nausea with vomiting, unspecified: Secondary | ICD-10-CM | POA: Insufficient documentation

## 2020-07-21 DIAGNOSIS — R1013 Epigastric pain: Secondary | ICD-10-CM | POA: Insufficient documentation

## 2020-07-21 DIAGNOSIS — Z87891 Personal history of nicotine dependence: Secondary | ICD-10-CM | POA: Insufficient documentation

## 2020-07-21 DIAGNOSIS — Z79899 Other long term (current) drug therapy: Secondary | ICD-10-CM | POA: Insufficient documentation

## 2020-07-21 DIAGNOSIS — E039 Hypothyroidism, unspecified: Secondary | ICD-10-CM | POA: Insufficient documentation

## 2020-07-21 DIAGNOSIS — Z8541 Personal history of malignant neoplasm of cervix uteri: Secondary | ICD-10-CM | POA: Insufficient documentation

## 2020-07-21 DIAGNOSIS — J45901 Unspecified asthma with (acute) exacerbation: Secondary | ICD-10-CM | POA: Insufficient documentation

## 2020-07-21 DIAGNOSIS — R079 Chest pain, unspecified: Secondary | ICD-10-CM | POA: Insufficient documentation

## 2020-07-21 LAB — HEPATIC FUNCTION PANEL
ALT: 13 U/L (ref 0–44)
AST: 14 U/L — ABNORMAL LOW (ref 15–41)
Albumin: 3.9 g/dL (ref 3.5–5.0)
Alkaline Phosphatase: 61 U/L (ref 38–126)
Bilirubin, Direct: 0.1 mg/dL (ref 0.0–0.2)
Indirect Bilirubin: 0.2 mg/dL — ABNORMAL LOW (ref 0.3–0.9)
Total Bilirubin: 0.3 mg/dL (ref 0.3–1.2)
Total Protein: 7 g/dL (ref 6.5–8.1)

## 2020-07-21 LAB — CBC
HCT: 39 % (ref 36.0–46.0)
Hemoglobin: 12.8 g/dL (ref 12.0–15.0)
MCH: 30.8 pg (ref 26.0–34.0)
MCHC: 32.8 g/dL (ref 30.0–36.0)
MCV: 94 fL (ref 80.0–100.0)
Platelets: 305 10*3/uL (ref 150–400)
RBC: 4.15 MIL/uL (ref 3.87–5.11)
RDW: 13.2 % (ref 11.5–15.5)
WBC: 8.4 10*3/uL (ref 4.0–10.5)
nRBC: 0 % (ref 0.0–0.2)

## 2020-07-21 LAB — LIPASE, BLOOD: Lipase: 149 U/L — ABNORMAL HIGH (ref 11–51)

## 2020-07-21 LAB — BASIC METABOLIC PANEL
Anion gap: 9 (ref 5–15)
BUN: 14 mg/dL (ref 6–20)
CO2: 22 mmol/L (ref 22–32)
Calcium: 9.3 mg/dL (ref 8.9–10.3)
Chloride: 108 mmol/L (ref 98–111)
Creatinine, Ser: 0.76 mg/dL (ref 0.44–1.00)
GFR calc Af Amer: >60 mL/min (ref >60)
GFR calc non Af Amer: >60 mL/min (ref >60)
Glucose, Bld: 105 mg/dL — ABNORMAL HIGH (ref 70–99)
Potassium: 3.7 mmol/L (ref 3.5–5.1)
Sodium: 139 mmol/L (ref 135–145)

## 2020-07-21 LAB — TROPONIN I (HIGH SENSITIVITY): Troponin I (High Sensitivity): 4 ng/L (ref <18)

## 2020-07-21 MED ORDER — HYDROCODONE-ACETAMINOPHEN 5-325 MG PO TABS
1.0000 | ORAL_TABLET | Freq: Once | ORAL | Status: AC
  Administered 2020-07-21: 1 via ORAL
  Filled 2020-07-21: qty 1

## 2020-07-21 MED ORDER — LIDOCAINE VISCOUS HCL 2 % MT SOLN
15.0000 mL | Freq: Once | OROMUCOSAL | Status: AC
  Administered 2020-07-21: 15 mL via ORAL
  Filled 2020-07-21: qty 15

## 2020-07-21 MED ORDER — HYDROCODONE-ACETAMINOPHEN 5-325 MG PO TABS: 1.0000 | ORAL_TABLET | Freq: Four times a day (QID) | ORAL | 0 refills | Status: DC | PRN

## 2020-07-21 MED ORDER — OMEPRAZOLE 20 MG PO CPDR
20.0000 mg | DELAYED_RELEASE_CAPSULE | Freq: Every day | ORAL | 0 refills | Status: DC
Start: 2020-07-21 — End: 2021-03-06

## 2020-07-21 MED ORDER — ONDANSETRON 4 MG PO TBDP
4.0000 mg | ORAL_TABLET | Freq: Three times a day (TID) | ORAL | 0 refills | Status: DC | PRN
Start: 2020-07-21 — End: 2021-03-06

## 2020-07-21 MED ORDER — ONDANSETRON HCL 4 MG/2ML IJ SOLN
4.0000 mg | Freq: Once | INTRAMUSCULAR | Status: AC
  Administered 2020-07-21: 4 mg via INTRAVENOUS
  Filled 2020-07-21: qty 2

## 2020-07-21 MED ORDER — FAMOTIDINE IN NACL 20-0.9 MG/50ML-% IV SOLN
20.0000 mg | Freq: Once | INTRAVENOUS | Status: AC
  Administered 2020-07-21: 20 mg via INTRAVENOUS
  Filled 2020-07-21: qty 50

## 2020-07-21 MED ORDER — MORPHINE SULFATE (PF) 4 MG/ML IV SOLN
4.0000 mg | Freq: Once | INTRAVENOUS | Status: AC
  Administered 2020-07-21: 4 mg via INTRAVENOUS
  Filled 2020-07-21: qty 1

## 2020-07-21 MED ORDER — SUCRALFATE 1 GM/10ML PO SUSP
1.0000 g | Freq: Three times a day (TID) | ORAL | 0 refills | Status: DC
Start: 2020-07-21 — End: 2021-03-06

## 2020-07-21 MED ORDER — ALUM & MAG HYDROXIDE-SIMETH 200-200-20 MG/5ML PO SUSP
30.0000 mL | Freq: Once | ORAL | Status: AC
  Administered 2020-07-21: 30 mL via ORAL
  Filled 2020-07-21: qty 30

## 2020-07-21 MED ORDER — SODIUM CHLORIDE 0.9% FLUSH
3.0000 mL | Freq: Once | INTRAVENOUS | Status: DC
  Filled 2020-07-21: qty 3

## 2020-07-21 NOTE — ED Notes (Signed)
ED Provider at bedside. 

## 2020-07-21 NOTE — Discharge Instructions (Signed)
Please read instructions below. Drink clear liquids until your stomach feels better. Then, slowly introduce bland foods into your diet as tolerated.  Avoid spicy, greasy, acidic foods as this can worsen your symptoms.  Avoid NSAID medications such as Advil/ibuprofen/Motrin, Aleve, aspirin, Goody's powder, BC powder.  Remain is sitting upright for at least 45 minutes after meals. Take the carafate before meals and at bedtime to help with stomach upset. Take the omeprazole daily. You can get this over-the-counter after your prescription runs out. Follow up with your primary care provider. Return to the ER for severely worsening abdominal pain, fever, uncontrollable vomiting, or vomiting blood.

## 2020-07-21 NOTE — ED Notes (Signed)
Pt ambulated to bathroom with cane.

## 2020-07-21 NOTE — ED Triage Notes (Signed)
Chest pain since 3 am. Tightness in her upper abdomen. Vomiting.

## 2020-07-21 NOTE — ED Provider Notes (Signed)
[facility] EMERGENCY DEPARTMENT Provider Note   CSN: [id number] Arrival date & time: 07/21/20  1421     History Chief Complaint  Patient presents with  . Chest Pain    Wendy Hoffman is a 54 y.o. female w PMHx GERD, hypertension, hyperlipidemia, cerebrovascular disease, asthma, cervical cancer, thyroid disease, presenting with epigastric abdominal pain/chest pain that began 5 PM last night. Pain is central/epigastric, constant pressure radiating to her back. Constant though waxing and waning.  Pain is not exertional.  She has had 2 episodes of vomiting, and frequent belching.  She states a week ago, she had been taking Aleve quite frequently for headache.  Denies alcohol use or other NSAID use.  Denies known history of CAD. Denies associated SOB, diaphoresis, D/C, fever, cough, unilateral leg pain or swelling.  The history is provided by the patient.       Past Medical History:  Diagnosis Date  . Anemia   . Anxiety   . Arthritis   . Asthma   . Cancer ([place])    cervic  . H/O: hysterectomy   . High cholesterol   . Hypertension   . Hypothyroidism   . Insomnia   . Medical history non-contributory   . Migraine   . Morbid obesity ([place])   . Shortness of breath   . Stroke ([place])   . Thyroid disease     Patient Active Problem List   Diagnosis Date Noted  . Snoring 05/03/2016  . Morbid obesity ([place]) 04/25/2014  . Asthma exacerbation 04/22/2014  . Asthma attack 04/22/2014  . Asthma with acute exacerbation 11/03/2013  . Hypothyroidism 11/03/2013  . Tobacco abuse 11/03/2013  . Migraine 09/22/2012  . Weakness of right side of body 09/22/2012  . Thyroid disease   . Hypertension   . High cholesterol   . H/O: hysterectomy   . Asthma 11/26/2008  . HEADACHE, CHRONIC 11/26/2008    Past Surgical History:  Procedure Laterality Date  . ABDOMINAL HYSTERECTOMY    . CESAREAN SECTION     3 occasions  . CHOLECYSTECTOMY    . THYROIDECTOMY, PARTIAL     Goiter     OB  History   No obstetric history on file.     Family History  Problem Relation Age of Onset  . Migraines Sister     Social History   Tobacco Use  . Smoking status: Former Smoker    Types: Cigarettes  . Smokeless tobacco: Never Used  Vaping Use  . Vaping Use: Never used  Substance Use Topics  . Alcohol use: No  . Drug use: No    Home Medications Prior to Admission medications   Medication Sig Start Date End Date Taking? Authorizing Provider  albuterol (PROVENTIL) (2.5 MG/3ML) 0.083% nebulizer solution Take 3 mLs (2.5 mg total) by nebulization every 4 (four) hours as needed for wheezing or shortness of breath. 00/8/67   Delora Fuel, MD  albuterol (VENTOLIN HFA) 108 (90 Base) MCG/ACT inhaler INHALE 1-2 PUFFS INTO THE LUNGS EVERY 6 (SIX) HOURS AS NEEDED FOR WHEEZING OR SHORTNESS OF BREATH. 03/07/20   Charlott Rakes, MD  amLODipine (NORVASC) 10 MG tablet Take 1 tablet (10 mg total) by mouth daily. 11/19/19   Charlott Rakes, MD  cyclobenzaprine (FLEXERIL) 10 MG tablet TAKE 1 TABLET (10 MG TOTAL) BY MOUTH 3 (THREE) TIMES DAILY AS NEEDED FOR MUSCLE SPASMS. 12/27/19   Charlott Rakes, MD  HYDROcodone-acetaminophen (NORCO/VICODIN) 5-325 MG tablet Take 1 tablet by mouth every 6 (six) hours as needed for  severe pain. 07/21/20   Sonnie Bias, [place] N, PA-C  Ibuprofen-Diphenhydramine HCl (ADVIL PM) 200-25 MG CAPS Take 1-2 tablets by mouth at bedtime as needed (sleep).    [provider]  omeprazole (PRILOSEC) 20 MG capsule Take 1 capsule (20 mg total) by mouth daily. 07/21/20   Boy Delamater, [place] N, PA-C  ondansetron (ZOFRAN ODT) 4 MG disintegrating tablet Take 1 tablet (4 mg total) by mouth every 8 (eight) hours as needed for nausea or vomiting. 07/21/20   Jeilyn Reznik, [place] N, PA-C  sucralfate (CARAFATE) 1 GM/10ML suspension Take 10 mLs (1 g total) by mouth 4 (four) times daily -  with meals and at bedtime. 07/21/20   Melva Faux, [place] N, PA-C  SYMBICORT 160-4.5 MCG/ACT inhaler INHALE 2 PUFFS INTO THE  LUNGS 2 (TWO) TIMES DAILY. 03/07/20   Charlott Rakes, MD  topiramate (TOPAMAX) 50 MG tablet Take 1 tablet (50 mg total) by mouth 2 (two) times daily. 11/19/19   Charlott Rakes, MD  fluticasone (FLONASE) 50 MCG/ACT nasal spray Place 1 spray into both nostrils daily. Patient not taking: Reported on 03/21/2019 12/29/17 06/29/19  Petrucelli, Aldona Bar R, PA-C    Allergies    Blueberry [vaccinium angustifolium], Mango flavor, Aspirin, and Penicillins  Review of Systems   Review of Systems  Constitutional: Negative for diaphoresis and fever.  Respiratory: Negative for cough and shortness of breath.   Cardiovascular: Positive for chest pain. Negative for palpitations and leg swelling.  Gastrointestinal: Positive for abdominal pain, nausea and vomiting.  All other systems reviewed and are negative.   Physical Exam Updated Vital Signs BP (!) 158/95   Pulse 90   Temp 98.3 F (36.8 C) (Oral)   Resp 16   Ht 5\' 4"  (1.626 m)   Wt 125.6 kg   SpO2 99%   BMI 47.55 kg/m   Physical Exam Vitals and nursing note reviewed.  Constitutional:      Appearance: She is well-developed. She is obese. She is not ill-appearing.  HENT:     Head: Normocephalic and atraumatic.  Eyes:     Conjunctiva/sclera: Conjunctivae normal.  Cardiovascular:     Rate and Rhythm: Normal rate and regular rhythm.  Pulmonary:     Effort: Pulmonary effort is normal. No respiratory distress.     Breath sounds: Normal breath sounds.     Comments: Lower sternal chest wall tenderness Abdominal:     General: Bowel sounds are normal.     Palpations: Abdomen is soft.     Tenderness: There is abdominal tenderness in the epigastric area. There is no guarding or rebound.  Musculoskeletal:     Right lower leg: No edema.     Left lower leg: No edema.  Skin:    General: Skin is warm.  Neurological:     Mental Status: She is alert.  Psychiatric:        Behavior: Behavior normal.     ED Results / Procedures / Treatments    Labs (all labs ordered are listed, but only abnormal results are displayed) Labs Reviewed  HEPATIC FUNCTION PANEL - Abnormal; Notable for the following components:      Result Value   AST 14 (*)    Indirect Bilirubin 0.2 (*)    All other components within normal limits  LIPASE, BLOOD - Abnormal; Notable for the following components:   Lipase 149 (*)    All other components within normal limits  BASIC METABOLIC PANEL - Abnormal; Notable for the following components:   Glucose, Bld 105 (*)  All other components within normal limits  CBC  TROPONIN I (HIGH SENSITIVITY)    EKG EKG Interpretation  Date/Time:  Monday July 21 2020 14:50:03 EDT Ventricular Rate:  92 PR Interval:  144 QRS Duration: 82 QT Interval:  350 QTC Calculation: 432 R Axis:   9 Text Interpretation: Normal sinus rhythm Nonspecific T wave abnormality Abnormal ECG Confirmed by Madalyn Rob [phone]) on 07/21/2020 10:57:06 PM   Radiology DG Chest 2 View  Result Date: 07/21/2020 CLINICAL DATA:  Chest pain EXAM: CHEST - 2 VIEW COMPARISON:  09/18/2019 FINDINGS: Lung volumes are small, but are symmetric. Lungs are clear. No pneumothorax or pleural effusion. Cardiac size within normal limits. No acute bone abnormality. IMPRESSION: No active cardiopulmonary disease. Electronically Signed   By: Fidela Salisbury MD   On: 07/21/2020 15:25    Procedures Procedures (including critical care time)  Medications Ordered in ED Medications  sodium chloride flush (NS) 0.9 % injection 3 mL (3 mLs Intravenous Not Given 07/21/20 1805)  ondansetron (ZOFRAN) injection 4 mg (4 mg Intravenous Given 07/21/20 1903)  famotidine (PEPCID) IVPB 20 mg premix (0 mg Intravenous Stopped 07/21/20 1930)  alum & mag hydroxide-simeth (MAALOX/MYLANTA) 200-200-20 MG/5ML suspension 30 mL (30 mLs Oral Given 07/21/20 2043)    And  lidocaine (XYLOCAINE) 2 % viscous mouth solution 15 mL (15 mLs Oral Given 07/21/20 2043)  morphine 4 MG/ML injection 4 mg (4 mg  Intravenous Given 07/21/20 2119)  ondansetron (ZOFRAN) injection 4 mg (4 mg Intravenous Given 07/21/20 2119)  HYDROcodone-acetaminophen (NORCO/VICODIN) 5-325 MG per tablet 1 tablet (1 tablet Oral Given 07/21/20 2314)    ED Course  I have reviewed the triage vital signs and the nursing notes.  Pertinent labs & imaging results that were available during my care of the patient were reviewed by me and considered in my medical decision making (see chart for details).  Clinical Course as of Jul 21 2356  Mon Jul 21, 2020  2157 Patient reevaluated, reports an improvement in symptoms.   [JR]    Clinical Course User Index [JR] Mehar Kirkwood, [place] N, PA-C   MDM Rules/Calculators/A&P                          Patient presenting with constant epigastric abdominal pain/central chest pain that began late last night with associated nausea and vomiting. No assoc diaphoresis or SOB.  No known history of CAD.  She does endorse frequent Aleve use last week, denies alcohol use.  Exam, epigastric tenderness and lower sternal tenderness is present, no guarding or rebound.  Lungs are clear, heart regular rate and rhythm.  Suspect symptoms may be more likely GI related, less likely ACS. Troponin is normal, EKG is nonischemic, CXR is clear. Labs with normal white count, normal renal and hepatic function.  Lipase is slightly elevated at 149.  Patient symptoms treated with improvement in the ED with antiemetics, pain medication, GI cocktail.  Recommend avoid NSAIDs, treat with PPI, Carafate, antiemetics.  She is instructed to follow with PCP and instructed of strict return precautions.  Patient is agreeable to plan, tolerating p.o. fluids, safe for discharge.  Final Clinical Impression(s) / ED Diagnoses Final diagnoses:  Epigastric abdominal pain  Non-intractable vomiting with nausea, unspecified vomiting type    Rx / DC Orders ED Discharge Orders         Ordered    ondansetron (ZOFRAN ODT) 4 MG disintegrating tablet   Every 8 hours PRN  Discontinue  Reprint     07/21/20 2308    omeprazole (PRILOSEC) 20 MG capsule  Daily     Discontinue  Reprint     07/21/20 2308    sucralfate (CARAFATE) 1 GM/10ML suspension  3 times daily with meals & bedtime     Discontinue  Reprint     07/21/20 2308    HYDROcodone-acetaminophen (NORCO/VICODIN) 5-325 MG tablet  Every 6 hours PRN     Discontinue  Reprint     07/21/20 2308           Charm Stenner, [place] N, PA-C 07/21/20 2358    Lucrezia Starch, MD 07/24/20 1709

## 2020-08-22 MED FILL — SYMBICORT 160-4.5 MCG INH: 160-4.5 | 30 days supply | Qty: 10 | Fill #0

## 2020-09-19 ENCOUNTER — Other Ambulatory Visit: Payer: Self-pay | Admitting: Physician Assistant

## 2020-09-19 ENCOUNTER — Other Ambulatory Visit: Payer: Self-pay | Admitting: Family Medicine

## 2020-09-19 DIAGNOSIS — J4540 Moderate persistent asthma, uncomplicated: Secondary | ICD-10-CM

## 2020-09-19 MED FILL — SYMBICORT 160-4.5 MCG INH: 160-4.5 | 30 days supply | Qty: 10 | Fill #1

## 2020-09-19 MED FILL — METHOCARBAMOL 500 MG TABS: 500 | 7 days supply | Qty: 60 | Fill #0

## 2020-09-19 MED FILL — DEXILANT DR 60 MG CAPSULE: 60 | 30 days supply | Qty: 30 | Fill #0

## 2020-09-19 NOTE — Telephone Encounter (Signed)
Requested medication (s) are due for refill today: yes  Requested medication (s) are on the active medication list:yes  Last refill:  03/07/20    Future visit scheduled: No   Notes to clinic:  last refill note states patient needs appointment. Called left VM for her to call and schedule    Requested Prescriptions  Pending Prescriptions Disp Refills   albuterol (VENTOLIN HFA) 108 (90 Base) MCG/ACT inhaler [Pharmacy Med Name: ALBUTEROL SULFATE HFA 108 ( 108 (90 BAS Aerosol] 18 g 0    Sig: INHALE 1-2 PUFFS INTO THE LUNGS EVERY 6 (SIX) HOURS AS NEEDED FOR WHEEZING OR SHORTNESS OF BREATH.      Pulmonology:  Beta Agonists Failed - 09/19/2020  4:16 PM      Failed - One inhaler should last at least one month. If the patient is requesting refills earlier, contact the patient to check for uncontrolled symptoms.      Passed - Valid encounter within last 12 months    Recent Outpatient Visits           10 months ago Essential hypertension   [place], Charlane Ferretti, MD   11 months ago Breast lump   [place], [place]   1 year ago Postoperative hypothyroidism   [place], [place], MD   6 years ago Asthma, chronic   [place] Theodis Blaze, MD   6 years ago Encounter to establish care   [place] Reyne Dumas, MD

## 2020-09-30 ENCOUNTER — Ambulatory Visit: Payer: Self-pay | Admitting: Gastroenterology

## 2020-10-02 MED FILL — predniSONE 10 MG TABS: 10 | 6 days supply | Qty: 21 | Fill #0

## 2020-10-03 ENCOUNTER — Other Ambulatory Visit: Payer: Self-pay | Admitting: Orthopedic Surgery

## 2020-10-03 MED FILL — GABAPENTIN 300 MG CAPSULE: 300 | 30 days supply | Qty: 30 | Fill #0

## 2020-10-08 ENCOUNTER — Emergency Department (HOSPITAL_BASED_OUTPATIENT_CLINIC_OR_DEPARTMENT_OTHER)
Admission: EM | Admit: 2020-10-08 | Discharge: 2020-10-08 | Disposition: A | Discharge status: Home / Self Care | Payer: BC Managed Care – PPO | Attending: Emergency Medicine | Admitting: Emergency Medicine

## 2020-10-08 ENCOUNTER — Emergency Department (HOSPITAL_BASED_OUTPATIENT_CLINIC_OR_DEPARTMENT_OTHER): Payer: BC Managed Care – PPO

## 2020-10-08 ENCOUNTER — Encounter (HOSPITAL_BASED_OUTPATIENT_CLINIC_OR_DEPARTMENT_OTHER): Payer: Self-pay

## 2020-10-08 ENCOUNTER — Other Ambulatory Visit: Payer: Self-pay

## 2020-10-08 DIAGNOSIS — Z87891 Personal history of nicotine dependence: Secondary | ICD-10-CM | POA: Insufficient documentation

## 2020-10-08 DIAGNOSIS — J069 Acute upper respiratory infection, unspecified: Secondary | ICD-10-CM | POA: Diagnosis not present

## 2020-10-08 DIAGNOSIS — Z7951 Long term (current) use of inhaled steroids: Secondary | ICD-10-CM | POA: Diagnosis not present

## 2020-10-08 DIAGNOSIS — Z20822 Contact with and (suspected) exposure to covid-19: Secondary | ICD-10-CM | POA: Insufficient documentation

## 2020-10-08 DIAGNOSIS — Z79899 Other long term (current) drug therapy: Secondary | ICD-10-CM | POA: Insufficient documentation

## 2020-10-08 DIAGNOSIS — I10 Essential (primary) hypertension: Secondary | ICD-10-CM | POA: Diagnosis not present

## 2020-10-08 DIAGNOSIS — E039 Hypothyroidism, unspecified: Secondary | ICD-10-CM | POA: Diagnosis not present

## 2020-10-08 DIAGNOSIS — Z8541 Personal history of malignant neoplasm of cervix uteri: Secondary | ICD-10-CM | POA: Insufficient documentation

## 2020-10-08 DIAGNOSIS — J45901 Unspecified asthma with (acute) exacerbation: Secondary | ICD-10-CM | POA: Diagnosis not present

## 2020-10-08 DIAGNOSIS — R0602 Shortness of breath: Secondary | ICD-10-CM | POA: Diagnosis present

## 2020-10-08 LAB — RESPIRATORY PANEL BY RT PCR (FLU A&B, COVID)
Influenza A by PCR: NEGATIVE
Influenza B by PCR: NEGATIVE
SARS Coronavirus 2 by RT PCR: NEGATIVE

## 2020-10-08 MED ORDER — ALBUTEROL SULFATE HFA 108 (90 BASE) MCG/ACT IN AERS
8.0000 | INHALATION_SPRAY | Freq: Once | RESPIRATORY_TRACT | Status: AC
  Administered 2020-10-08: 8 via RESPIRATORY_TRACT
  Filled 2020-10-08: qty 6.7

## 2020-10-08 MED ORDER — ALBUTEROL SULFATE (2.5 MG/3ML) 0.083% IN NEBU
5.0000 mg | INHALATION_SOLUTION | Freq: Once | RESPIRATORY_TRACT | Status: AC
  Administered 2020-10-08: 5 mg via RESPIRATORY_TRACT
  Filled 2020-10-08: qty 6

## 2020-10-08 MED ORDER — PREDNISONE 20 MG PO TABS
40.0000 mg | ORAL_TABLET | Freq: Once | ORAL | Status: AC
  Administered 2020-10-08: 40 mg via ORAL
  Filled 2020-10-08: qty 2

## 2020-10-08 MED ORDER — IPRATROPIUM BROMIDE HFA 17 MCG/ACT IN AERS
2.0000 | INHALATION_SPRAY | Freq: Once | RESPIRATORY_TRACT | Status: AC
  Administered 2020-10-08: 2 via RESPIRATORY_TRACT
  Filled 2020-10-08: qty 12.9

## 2020-10-08 MED ORDER — PREDNISONE 20 MG PO TABS: 40.0000 mg | ORAL_TABLET | Freq: Every day | ORAL | 0 refills | Status: DC

## 2020-10-08 NOTE — ED Triage Notes (Signed)
Pt arrives with c/o SOB since yesterday states that she does have asthma and has been using her inhaler with no relief.

## 2020-10-08 NOTE — ED Provider Notes (Signed)
[facility] EMERGENCY DEPARTMENT Provider Note   CSN: [id number] Arrival date & time: 10/08/20  1710     History Chief Complaint  Patient presents with  . Shortness of Breath    Wendy Hoffman is a 54 y.o. female.  54 year old female with past medical history below including asthma, stroke, migraines, cervical cancer, hypertension, hyperlipidemia who presents with shortness of breath.  Patient states that yesterday she began having cough associated with sore throat.  She also began having wheezing and shortness of breath consistent with her usual asthma exacerbation.  Normally when she uses albuterol it helps but this time the albuterol has not helped with her shortness of breath.  She reports compliance with daily Symbicort.  She reports some chest heaviness that she often gets with wheezing.  No fevers, vomiting, leg swelling, or rash.  She reports that a school-age family member is sick with similar symptoms. She is not vaccinated against COVID-19.   The history is provided by the patient.  Shortness of Breath      Past Medical History:  Diagnosis Date  . Anemia   . Anxiety   . Arthritis   . Asthma   . Cancer ([place])    cervic  . H/O: hysterectomy   . High cholesterol   . Hypertension   . Hypothyroidism   . Insomnia   . Medical history non-contributory   . Migraine   . Morbid obesity ([place])   . Shortness of breath   . Stroke ([place])   . Thyroid disease     Patient Active Problem List   Diagnosis Date Noted  . Snoring 05/03/2016  . Morbid obesity ([place]) 04/25/2014  . Asthma exacerbation 04/22/2014  . Asthma attack 04/22/2014  . Asthma with acute exacerbation 11/03/2013  . Hypothyroidism 11/03/2013  . Tobacco abuse 11/03/2013  . Migraine 09/22/2012  . Weakness of right side of body 09/22/2012  . Thyroid disease   . Hypertension   . High cholesterol   . H/O: hysterectomy   . Asthma 11/26/2008  . HEADACHE, CHRONIC 11/26/2008    Past Surgical  History:  Procedure Laterality Date  . ABDOMINAL HYSTERECTOMY    . CESAREAN SECTION     3 occasions  . CHOLECYSTECTOMY    . THYROIDECTOMY, PARTIAL     Goiter     OB History   No obstetric history on file.     Family History  Problem Relation Age of Onset  . Migraines Sister     Social History   Tobacco Use  . Smoking status: Former Smoker    Types: Cigarettes  . Smokeless tobacco: Never Used  Vaping Use  . Vaping Use: Never used  Substance Use Topics  . Alcohol use: No  . Drug use: No    Home Medications Prior to Admission medications   Medication Sig Start Date End Date Taking? Authorizing Provider  albuterol (PROVENTIL) (2.5 MG/3ML) 0.083% nebulizer solution Take 3 mLs (2.5 mg total) by nebulization every 4 (four) hours as needed for wheezing or shortness of breath. 95/1/88   Delora Fuel, MD  albuterol (VENTOLIN HFA) 108 (90 Base) MCG/ACT inhaler INHALE 1-2 PUFFS INTO THE LUNGS EVERY 6 (SIX) HOURS AS NEEDED FOR WHEEZING OR SHORTNESS OF BREATH. 03/07/20   Charlott Rakes, MD  amLODipine (NORVASC) 10 MG tablet Take 1 tablet (10 mg total) by mouth daily. 11/19/19   Charlott Rakes, MD  cyclobenzaprine (FLEXERIL) 10 MG tablet TAKE 1 TABLET (10 MG TOTAL) BY MOUTH 3 (THREE) TIMES DAILY AS  NEEDED FOR MUSCLE SPASMS. 12/27/19   Charlott Rakes, MD  HYDROcodone-acetaminophen (NORCO/VICODIN) 5-325 MG tablet Take 1 tablet by mouth every 6 (six) hours as needed for severe pain. 07/21/20   Robinson, [place] N, PA-C  Ibuprofen-Diphenhydramine HCl (ADVIL PM) 200-25 MG CAPS Take 1-2 tablets by mouth at bedtime as needed (sleep).    [provider]  omeprazole (PRILOSEC) 20 MG capsule Take 1 capsule (20 mg total) by mouth daily. 07/21/20   Robinson, [place] N, PA-C  ondansetron (ZOFRAN ODT) 4 MG disintegrating tablet Take 1 tablet (4 mg total) by mouth every 8 (eight) hours as needed for nausea or vomiting. 07/21/20   Robinson, [place] N, PA-C  predniSONE (DELTASONE) 20 MG tablet Take 2  tablets (40 mg total) by mouth daily. 10/08/20   Kelisha Dall, Wenda Overland, MD  sucralfate (CARAFATE) 1 GM/10ML suspension Take 10 mLs (1 g total) by mouth 4 (four) times daily -  with meals and at bedtime. 07/21/20   Robinson, [place] N, PA-C  SYMBICORT 160-4.5 MCG/ACT inhaler INHALE 2 PUFFS INTO THE LUNGS 2 (TWO) TIMES DAILY. 03/07/20   Charlott Rakes, MD  topiramate (TOPAMAX) 50 MG tablet Take 1 tablet (50 mg total) by mouth 2 (two) times daily. 11/19/19   Charlott Rakes, MD  fluticasone (FLONASE) 50 MCG/ACT nasal spray Place 1 spray into both nostrils daily. Patient not taking: Reported on 03/21/2019 12/29/17 06/29/19  Petrucelli, Aldona Bar R, PA-C    Allergies    Blueberry [vaccinium angustifolium], Mango flavor, Aspirin, and Penicillins  Review of Systems   Review of Systems  Respiratory: Positive for shortness of breath.    All other systems reviewed and are negative except that which was mentioned in HPI  Physical Exam Updated Vital Signs BP 127/78 (BP Location: Right Arm)   Pulse (!) 110   Temp 99.1 F (37.3 C) (Oral)   Resp (!) 22   Ht 5\' 4"  (1.626 m)   Wt 124.7 kg   SpO2 98%   BMI 47.20 kg/m   Physical Exam Vitals and nursing note reviewed.  Constitutional:      General: She is not in acute distress.    Appearance: She is well-developed. She is obese.  HENT:     Head: Normocephalic and atraumatic.  Eyes:     Conjunctiva/sclera: Conjunctivae normal.  Cardiovascular:     Rate and Rhythm: Normal rate and regular rhythm.     Heart sounds: Normal heart sounds. No murmur heard.   Pulmonary:     Comments: Mildly dyspneic but able to speak in full sentences, diffuse expiratory wheezes b/l throughout all lung fields Abdominal:     General: Bowel sounds are normal. There is no distension.     Palpations: Abdomen is soft.     Tenderness: There is no abdominal tenderness.  Musculoskeletal:     Cervical back: Neck supple.     Right lower leg: No edema.     Left lower leg: No  edema.  Skin:    General: Skin is warm and dry.  Neurological:     Mental Status: She is alert and oriented to person, place, and time.     Comments: Fluent speech  Psychiatric:        Judgment: Judgment normal.     ED Results / Procedures / Treatments   Labs (all labs ordered are listed, but only abnormal results are displayed) Labs Reviewed  RESPIRATORY PANEL BY RT PCR (FLU A&B, COVID)    EKG EKG Interpretation  Date/Time:  Wednesday October 08 2020 18:08:10 EDT Ventricular Rate:  102 PR Interval:    QRS Duration: 74 QT Interval:  379 QTC Calculation: 494 R Axis:   -5 Text Interpretation: Sinus tachycardia Borderline T abnormalities, diffuse leads Borderline prolonged QT interval No significant change since last tracing Confirmed by Theotis Burrow [phone]) on 10/08/2020 6:10:28 PM   Radiology DG Chest Portable 1 View  Result Date: 10/08/2020 CLINICAL DATA:  Shortness of breath, wheezing EXAM: PORTABLE CHEST 1 VIEW COMPARISON:  07/21/2020 FINDINGS: The heart size and mediastinal contours are within normal limits. Both lungs are clear. No pleural effusion or pneumothorax. The visualized skeletal structures are unremarkable. IMPRESSION: No acute process in the chest. Electronically Signed   By: Macy Mis M.D.   On: 10/08/2020 18:27    Procedures Procedures (including critical care time)  Medications Ordered in ED Medications  predniSONE (DELTASONE) tablet 40 mg (40 mg Oral Given 10/08/20 1807)  ipratropium (ATROVENT HFA) inhaler 2 puff (2 puffs Inhalation Given 10/08/20 1759)  albuterol (VENTOLIN HFA) 108 (90 Base) MCG/ACT inhaler 8 puff (8 puffs Inhalation Given 10/08/20 1759)  albuterol (PROVENTIL) (2.5 MG/3ML) 0.083% nebulizer solution 5 mg (5 mg Nebulization Given 10/08/20 1937)    ED Course  I have reviewed the triage vital signs and the nursing notes.  Pertinent labs & imaging results that were available during my care of the patient were reviewed by me  and considered in my medical decision making (see chart for details).    MDM Rules/Calculators/A&P                          Pt w/ wheezing on exam, normal O2 sat on RA, no distress. Gave albuterol, atrovent, and prednisone. Chest x-ray clear. Covid/flu/RSV negative. On 1st reassessment, patient slightly improved but still with diminished breath sounds and wheezing therefore gave another breathing treatment. On final reassessment, she was sitting up and felt much better, stated that she was ready to go home. Provided with steroids for home and discussed continuing albuterol for the next few days. Reviewed return precautions and she voiced understanding. Final Clinical Impression(s) / ED Diagnoses Final diagnoses:  Exacerbation of asthma, unspecified asthma severity, unspecified whether persistent  Viral URI with cough    Rx / DC Orders ED Discharge Orders         Ordered    predniSONE (DELTASONE) 20 MG tablet  Daily        10/08/20 2034           Jaliya Siegmann, Wenda Overland, MD 10/08/20 [phone]

## 2020-11-05 MED FILL — GABAPENTIN 300 MG CAPSULE: 300 | 30 days supply | Qty: 30 | Fill #0

## 2020-11-05 MED FILL — DEXILANT DR 60 MG CAPSULE: 60 | 30 days supply | Qty: 30 | Fill #1

## 2020-11-05 MED FILL — SYMBICORT 160-4.5 MCG INH: 160-4.5 | 30 days supply | Qty: 10 | Fill #2

## 2020-12-11 MED FILL — SYMBICORT 160-4.5 MCG INH: 160-4.5 | 30 days supply | Qty: 10 | Fill #3

## 2020-12-17 ENCOUNTER — Other Ambulatory Visit: Payer: Self-pay | Admitting: Neurology

## 2020-12-22 ENCOUNTER — Encounter: Payer: Self-pay | Admitting: Neurology

## 2020-12-22 ENCOUNTER — Ambulatory Visit: Payer: BC Managed Care – PPO | Admitting: Neurology

## 2020-12-22 ENCOUNTER — Telehealth: Payer: Self-pay

## 2020-12-22 NOTE — Telephone Encounter (Signed)
Pt did not show for their appt with Dr. Dohmeier today.  

## 2021-01-06 ENCOUNTER — Other Ambulatory Visit: Payer: Self-pay | Admitting: *Deleted

## 2021-01-07 MED FILL — GABAPENTIN 300 MG CAPSULE: 300 | 30 days supply | Qty: 30 | Fill #0

## 2021-01-07 MED FILL — DEXILANT DR 60 MG CAPSULE: 60 | 30 days supply | Qty: 30 | Fill #0

## 2021-01-07 MED FILL — AMLODIPINE BESYLATE 10 MG T: 10 | 30 days supply | Qty: 30 | Fill #0

## 2021-01-07 MED FILL — ALBUTEROL SULFATE HFA 108 (: 108 (90 BAS | 25 days supply | Qty: 18 | Fill #0

## 2021-01-07 MED FILL — SYMBICORT 160-4.5 MCG INH: 160-4.5 | 30 days supply | Qty: 10 | Fill #0

## 2021-01-07 MED FILL — ROSUVASTATIN CALCIUM 5 MG T: 5 | 30 days supply | Qty: 30 | Fill #0

## 2021-01-07 MED FILL — METHOCARBAMOL 750 MG TABS: 750 | 30 days supply | Qty: 90 | Fill #0

## 2021-01-23 MED FILL — AMLODIPINE BESYLATE 10 MG T: 10 | 30 days supply | Qty: 30 | Fill #0

## 2021-01-23 MED FILL — ALBUTEROL SULFATE HFA 108 (: 108 (90 BAS | 25 days supply | Qty: 18 | Fill #0

## 2021-01-23 MED FILL — SYMBICORT 160-4.5 MCG INH: 160-4.5 | 30 days supply | Qty: 10 | Fill #0

## 2021-01-23 MED FILL — DEXILANT DR 60 MG CAPSULE: 60 | 30 days supply | Qty: 30 | Fill #0

## 2021-01-23 MED FILL — METHOCARBAMOL 750 MG TABS: 750 | 30 days supply | Qty: 90 | Fill #0

## 2021-01-23 MED FILL — GABAPENTIN 300 MG CAPSULE: 300 | 30 days supply | Qty: 30 | Fill #0

## 2021-01-23 MED FILL — ROSUVASTATIN CALCIUM 5 MG T: 5 | 30 days supply | Qty: 30 | Fill #0

## 2021-02-10 DIAGNOSIS — C259 Malignant neoplasm of pancreas, unspecified: Secondary | ICD-10-CM | POA: Insufficient documentation

## 2021-02-10 HISTORY — DX: Malignant neoplasm of pancreas, unspecified: C25.9

## 2021-02-16 ENCOUNTER — Other Ambulatory Visit: Payer: Self-pay

## 2021-02-16 ENCOUNTER — Emergency Department (HOSPITAL_BASED_OUTPATIENT_CLINIC_OR_DEPARTMENT_OTHER): Payer: Medicaid Other

## 2021-02-16 ENCOUNTER — Inpatient Hospital Stay (HOSPITAL_BASED_OUTPATIENT_CLINIC_OR_DEPARTMENT_OTHER)
Admission: EM | Admit: 2021-02-16 | Discharge: 2021-02-28 | DRG: 424 | Disposition: A | Discharge status: Home / Self Care | Payer: Medicaid Other | Attending: Internal Medicine | Admitting: Internal Medicine

## 2021-02-16 ENCOUNTER — Encounter (HOSPITAL_BASED_OUTPATIENT_CLINIC_OR_DEPARTMENT_OTHER): Payer: Self-pay | Admitting: *Deleted

## 2021-02-16 DIAGNOSIS — I700 Atherosclerosis of aorta: Secondary | ICD-10-CM | POA: Diagnosis present

## 2021-02-16 DIAGNOSIS — K3189 Other diseases of stomach and duodenum: Secondary | ICD-10-CM | POA: Diagnosis not present

## 2021-02-16 DIAGNOSIS — K2100 Gastro-esophageal reflux disease with esophagitis, without bleeding: Secondary | ICD-10-CM | POA: Diagnosis present

## 2021-02-16 DIAGNOSIS — K644 Residual hemorrhoidal skin tags: Secondary | ICD-10-CM | POA: Diagnosis present

## 2021-02-16 DIAGNOSIS — J431 Panlobular emphysema: Secondary | ICD-10-CM | POA: Diagnosis not present

## 2021-02-16 DIAGNOSIS — D649 Anemia, unspecified: Secondary | ICD-10-CM | POA: Diagnosis not present

## 2021-02-16 DIAGNOSIS — K648 Other hemorrhoids: Secondary | ICD-10-CM | POA: Diagnosis present

## 2021-02-16 DIAGNOSIS — Z800 Family history of malignant neoplasm of digestive organs: Secondary | ICD-10-CM

## 2021-02-16 DIAGNOSIS — E669 Obesity, unspecified: Secondary | ICD-10-CM | POA: Diagnosis present

## 2021-02-16 DIAGNOSIS — E039 Hypothyroidism, unspecified: Secondary | ICD-10-CM | POA: Diagnosis present

## 2021-02-16 DIAGNOSIS — R1013 Epigastric pain: Secondary | ICD-10-CM

## 2021-02-16 DIAGNOSIS — Z87891 Personal history of nicotine dependence: Secondary | ICD-10-CM

## 2021-02-16 DIAGNOSIS — D500 Iron deficiency anemia secondary to blood loss (chronic): Secondary | ICD-10-CM | POA: Diagnosis not present

## 2021-02-16 DIAGNOSIS — J449 Chronic obstructive pulmonary disease, unspecified: Secondary | ICD-10-CM | POA: Diagnosis present

## 2021-02-16 DIAGNOSIS — E038 Other specified hypothyroidism: Secondary | ICD-10-CM | POA: Diagnosis not present

## 2021-02-16 DIAGNOSIS — R9431 Abnormal electrocardiogram [ECG] [EKG]: Secondary | ICD-10-CM | POA: Diagnosis not present

## 2021-02-16 DIAGNOSIS — D62 Acute posthemorrhagic anemia: Secondary | ICD-10-CM | POA: Diagnosis present

## 2021-02-16 DIAGNOSIS — R195 Other fecal abnormalities: Secondary | ICD-10-CM

## 2021-02-16 DIAGNOSIS — Z8673 Personal history of transient ischemic attack (TIA), and cerebral infarction without residual deficits: Secondary | ICD-10-CM | POA: Diagnosis not present

## 2021-02-16 DIAGNOSIS — K8689 Other specified diseases of pancreas: Secondary | ICD-10-CM

## 2021-02-16 DIAGNOSIS — I10 Essential (primary) hypertension: Secondary | ICD-10-CM | POA: Diagnosis present

## 2021-02-16 DIAGNOSIS — G43909 Migraine, unspecified, not intractable, without status migrainosus: Secondary | ICD-10-CM | POA: Diagnosis present

## 2021-02-16 DIAGNOSIS — R935 Abnormal findings on diagnostic imaging of other abdominal regions, including retroperitoneum: Secondary | ICD-10-CM

## 2021-02-16 DIAGNOSIS — Z79899 Other long term (current) drug therapy: Secondary | ICD-10-CM | POA: Diagnosis not present

## 2021-02-16 DIAGNOSIS — R160 Hepatomegaly, not elsewhere classified: Secondary | ICD-10-CM

## 2021-02-16 DIAGNOSIS — E46 Unspecified protein-calorie malnutrition: Secondary | ICD-10-CM | POA: Diagnosis present

## 2021-02-16 DIAGNOSIS — Z6841 Body Mass Index (BMI) 40.0 and over, adult: Secondary | ICD-10-CM | POA: Diagnosis not present

## 2021-02-16 DIAGNOSIS — C250 Malignant neoplasm of head of pancreas: Secondary | ICD-10-CM | POA: Diagnosis present

## 2021-02-16 DIAGNOSIS — E7800 Pure hypercholesterolemia, unspecified: Secondary | ICD-10-CM | POA: Diagnosis not present

## 2021-02-16 DIAGNOSIS — K5730 Diverticulosis of large intestine without perforation or abscess without bleeding: Secondary | ICD-10-CM | POA: Diagnosis present

## 2021-02-16 DIAGNOSIS — G4700 Insomnia, unspecified: Secondary | ICD-10-CM | POA: Diagnosis present

## 2021-02-16 DIAGNOSIS — K922 Gastrointestinal hemorrhage, unspecified: Secondary | ICD-10-CM

## 2021-02-16 DIAGNOSIS — Z20822 Contact with and (suspected) exposure to covid-19: Secondary | ICD-10-CM | POA: Diagnosis present

## 2021-02-16 DIAGNOSIS — M1990 Unspecified osteoarthritis, unspecified site: Secondary | ICD-10-CM | POA: Diagnosis present

## 2021-02-16 DIAGNOSIS — R634 Abnormal weight loss: Secondary | ICD-10-CM

## 2021-02-16 DIAGNOSIS — Z7951 Long term (current) use of inhaled steroids: Secondary | ICD-10-CM

## 2021-02-16 DIAGNOSIS — Z8541 Personal history of malignant neoplasm of cervix uteri: Secondary | ICD-10-CM | POA: Diagnosis not present

## 2021-02-16 DIAGNOSIS — B370 Candidal stomatitis: Secondary | ICD-10-CM | POA: Diagnosis not present

## 2021-02-16 DIAGNOSIS — E785 Hyperlipidemia, unspecified: Secondary | ICD-10-CM | POA: Diagnosis present

## 2021-02-16 DIAGNOSIS — C787 Secondary malignant neoplasm of liver and intrahepatic bile duct: Secondary | ICD-10-CM | POA: Diagnosis present

## 2021-02-16 DIAGNOSIS — F419 Anxiety disorder, unspecified: Secondary | ICD-10-CM | POA: Diagnosis present

## 2021-02-16 DIAGNOSIS — Q453 Other congenital malformations of pancreas and pancreatic duct: Secondary | ICD-10-CM

## 2021-02-16 DIAGNOSIS — Z807 Family history of other malignant neoplasms of lymphoid, hematopoietic and related tissues: Secondary | ICD-10-CM

## 2021-02-16 DIAGNOSIS — C259 Malignant neoplasm of pancreas, unspecified: Secondary | ICD-10-CM

## 2021-02-16 LAB — HEPATIC FUNCTION PANEL
ALT: 19 U/L (ref 0–44)
AST: 24 U/L (ref 15–41)
Albumin: 3.3 g/dL — ABNORMAL LOW (ref 3.5–5.0)
Alkaline Phosphatase: 83 U/L (ref 38–126)
Bilirubin, Direct: <0.1 mg/dL (ref 0.0–0.2)
Total Bilirubin: 0.5 mg/dL (ref 0.3–1.2)
Total Protein: 7 g/dL (ref 6.5–8.1)

## 2021-02-16 LAB — CBC
HCT: 19.8 % — ABNORMAL LOW (ref 36.0–46.0)
Hemoglobin: 5.8 g/dL — CL (ref 12.0–15.0)
MCH: 20.2 pg — ABNORMAL LOW (ref 26.0–34.0)
MCHC: 29.3 g/dL — ABNORMAL LOW (ref 30.0–36.0)
MCV: 69 fL — ABNORMAL LOW (ref 80.0–100.0)
Platelets: 557 10*3/uL — ABNORMAL HIGH (ref 150–400)
RBC: 2.87 MIL/uL — ABNORMAL LOW (ref 3.87–5.11)
RDW: 21.1 % — ABNORMAL HIGH (ref 11.5–15.5)
WBC: 12.9 10*3/uL — ABNORMAL HIGH (ref 4.0–10.5)
nRBC: 0.3 % — ABNORMAL HIGH (ref 0.0–0.2)

## 2021-02-16 LAB — RESP PANEL BY RT-PCR (FLU A&B, COVID) ARPGX2
Influenza A by PCR: NEGATIVE
Influenza B by PCR: NEGATIVE
SARS Coronavirus 2 by RT PCR: NEGATIVE

## 2021-02-16 LAB — URINALYSIS, ROUTINE W REFLEX MICROSCOPIC
Glucose, UA: NEGATIVE mg/dL
Hgb urine dipstick: NEGATIVE
Ketones, ur: NEGATIVE mg/dL
Leukocytes,Ua: NEGATIVE
Nitrite: NEGATIVE
Protein, ur: NEGATIVE mg/dL
Specific Gravity, Urine: 1.015 (ref 1.005–1.030)
pH: 6 (ref 5.0–8.0)

## 2021-02-16 LAB — COMPREHENSIVE METABOLIC PANEL
ALT: 20 U/L (ref 0–44)
AST: 29 U/L (ref 15–41)
Albumin: 3.7 g/dL (ref 3.5–5.0)
Alkaline Phosphatase: 82 U/L (ref 38–126)
Anion gap: 12 (ref 5–15)
BUN: 14 mg/dL (ref 6–20)
CO2: 23 mmol/L (ref 22–32)
Calcium: 9.7 mg/dL (ref 8.9–10.3)
Chloride: 102 mmol/L (ref 98–111)
Creatinine, Ser: 0.77 mg/dL (ref 0.44–1.00)
GFR, Estimated: >60 mL/min (ref >60)
Glucose, Bld: 116 mg/dL — ABNORMAL HIGH (ref 70–99)
Potassium: 3.8 mmol/L (ref 3.5–5.1)
Sodium: 137 mmol/L (ref 135–145)
Total Bilirubin: 0.2 mg/dL — ABNORMAL LOW (ref 0.3–1.2)
Total Protein: 7.6 g/dL (ref 6.5–8.1)

## 2021-02-16 LAB — PREPARE RBC (CROSSMATCH)

## 2021-02-16 LAB — RETICULOCYTES
Immature Retic Fract: 24.4 % — ABNORMAL HIGH (ref 2.3–15.9)
RBC.: 2.52 MIL/uL — ABNORMAL LOW (ref 3.87–5.11)
Retic Count, Absolute: 42.8 10*3/uL (ref 19.0–186.0)
Retic Ct Pct: 1.7 % (ref 0.4–3.1)

## 2021-02-16 LAB — LIPASE, BLOOD: Lipase: 266 U/L — ABNORMAL HIGH (ref 11–51)

## 2021-02-16 LAB — HEMOGLOBIN AND HEMATOCRIT, BLOOD
HCT: 17.8 % — ABNORMAL LOW (ref 36.0–46.0)
Hemoglobin: 5 g/dL — CL (ref 12.0–15.0)

## 2021-02-16 LAB — PREGNANCY, URINE: Preg Test, Ur: NEGATIVE

## 2021-02-16 LAB — OCCULT BLOOD X 1 CARD TO LAB, STOOL: Fecal Occult Bld: POSITIVE — AB

## 2021-02-16 LAB — PHOSPHORUS: Phosphorus: 3.7 mg/dL (ref 2.5–4.6)

## 2021-02-16 LAB — MAGNESIUM: Magnesium: 2.1 mg/dL (ref 1.7–2.4)

## 2021-02-16 MED ORDER — BOOST / RESOURCE BREEZE PO LIQD CUSTOM
1.0000 | Freq: Three times a day (TID) | ORAL | Status: DC
  Administered 2021-02-18 – 2021-02-19 (×4): 1 via ORAL

## 2021-02-16 MED ORDER — ONDANSETRON HCL 4 MG/2ML IJ SOLN
4.0000 mg | Freq: Once | INTRAMUSCULAR | Status: AC
  Administered 2021-02-16: 4 mg via INTRAVENOUS
  Filled 2021-02-16: qty 2

## 2021-02-16 MED ORDER — PANTOPRAZOLE SODIUM 40 MG IV SOLR
40.0000 mg | Freq: Once | INTRAVENOUS | Status: AC
  Administered 2021-02-16: 40 mg via INTRAVENOUS
  Filled 2021-02-16: qty 40

## 2021-02-16 MED ORDER — IOHEXOL 300 MG/ML  SOLN: 100.0000 mL | Freq: Once | INTRAMUSCULAR | Status: DC | PRN

## 2021-02-16 MED ORDER — ONDANSETRON HCL 4 MG PO TABS
4.0000 mg | ORAL_TABLET | Freq: Four times a day (QID) | ORAL | Status: DC | PRN
  Administered 2021-02-17 – 2021-02-28 (×4): 4 mg via ORAL
  Filled 2021-02-16 (×4): qty 1

## 2021-02-16 MED ORDER — PANTOPRAZOLE SODIUM 40 MG IV SOLR
40.0000 mg | Freq: Two times a day (BID) | INTRAVENOUS | Status: DC
  Administered 2021-02-17 – 2021-02-24 (×16): 40 mg via INTRAVENOUS
  Filled 2021-02-16 (×16): qty 40

## 2021-02-16 MED ORDER — ONDANSETRON HCL 4 MG/2ML IJ SOLN
4.0000 mg | Freq: Four times a day (QID) | INTRAMUSCULAR | Status: DC | PRN
  Administered 2021-02-17 – 2021-02-27 (×14): 4 mg via INTRAVENOUS
  Filled 2021-02-16 (×15): qty 2

## 2021-02-16 MED ORDER — SODIUM CHLORIDE 0.9% IV SOLUTION: Freq: Once | INTRAVENOUS | Status: DC

## 2021-02-16 MED ORDER — FENTANYL CITRATE (PF) 100 MCG/2ML IJ SOLN
50.0000 ug | INTRAMUSCULAR | Status: AC | PRN
Start: 2021-02-16 — End: 2021-02-16
  Administered 2021-02-16 (×2): 50 ug via INTRAVENOUS
  Filled 2021-02-16 (×2): qty 2

## 2021-02-16 NOTE — ED Notes (Signed)
ED Provider at bedside. 

## 2021-02-16 NOTE — H&P (Signed)
History and Physical    Wendy Hoffman XKG:[id number] DOB: 1966/10/29 DOA: 02/16/2021  PCP: Patient, No Pcp Per   Patient coming from: Home via [facility] ED.  I have personally briefly reviewed patient's old medical records in [place]  Chief Complaint: Abdominal pain and vomiting.  HPI: Wendy Hoffman is a 55 y.o. female with medical history significant of iron deficiency anemia, anxiety, osteoarthritis, asthma/COPD, cervical cancer/hysterectomy, hyperlipidemia, hypertension, hypothyroidism, insomnia, migraine headaches, class III obesity, history of other nonhemorrhagic stroke who is coming to the emergency department at [facility] due to abdominal pain, bloating, frequent nausea and emesis, hypersomnolence for the past few days and associated with a 38 pound weight loss in the last 2 months.  No melena or hematochezia.  She denies fever, chills, rhinorrhea, sore throat, wheezing or hemoptysis, but has felt dyspneic often.  No chest pain, palpitations, diaphoresis, PND, orthopnea or recent pitting edema of the lower extremities.  Dysuria, frequency or hematuria.  No polyuria, polydipsia, polyphagia or blurred vision.  ED Course: Initial vital signs were temperature 98.1 F, pulse 92, respiration 18, BP 115/73 mmHg O2 sat 98% on room air.  The patient received 40 mg of Protonix in the emergency department.  Labwork: Fecal occult blood was positive.  Urinalysis showed small bilirubin, but was otherwise normal.  CBC with a white count of 12.9, hemoglobin 5.8 g/dL (12.8 g/dL 7 months ago) and platelets 557.  CMP shows a glucose of 160 mg/dL, but is otherwise unremarkable.  Lipase elevated at 266 units/L.  Influenza and coronavirus PCR was negative.  Imaging: CT abdomen/pelvis without contrast shows multiple large hypoechoic masses in the patient's liver consistent with metastatic disease.  These are amenable to percutaneous biopsy as clinically indicated.  There is an ill-defined  masslike area in the pancreatic head measuring about 4.7 cm.  MRI of the abdomen suggested for further evaluation and staging.  There is a possible filling defect within the right ventricle.  Echocardiogram suggested.  There is aortic atherosclerosis.  Review of Systems: As per HPI otherwise all other systems reviewed and are negative.  Past Medical History:  Diagnosis Date  . Anemia   . Anxiety   . Arthritis   . Asthma   . Cancer ([place])    cervic  . H/O: hysterectomy   . High cholesterol   . Hypertension   . Hypothyroidism   . Insomnia   . Medical history non-contributory   . Migraine   . Morbid obesity ([place])   . Shortness of breath   . Stroke ([place])   . Thyroid disease    Past Surgical History:  Procedure Laterality Date  . ABDOMINAL HYSTERECTOMY    . CESAREAN SECTION     3 occasions  . CHOLECYSTECTOMY    . THYROIDECTOMY, PARTIAL     Goiter   Social History  reports that she has quit smoking. Her smoking use included cigarettes. She has never used smokeless tobacco. She reports that she does not drink alcohol and does not use drugs.  Allergies  Allergen Reactions  . Blueberry [Vaccinium Angustifolium] Anaphylaxis  . Mango Flavor Anaphylaxis  . Aspirin Rash  . Penicillins Hives    Has patient had a PCN reaction causing immediate rash, facial/tongue/throat swelling, SOB or lightheadedness with hypotension: Yes Has patient had a PCN reaction causing severe rash involving mucus membranes or skin necrosis: No Has patient had a PCN reaction that required hospitalization No Has patient had a PCN reaction occurring within the last 10  years: No If all of the above answers are "NO", then may proceed with Cephalosporin use.    Family History  Problem Relation Age of Onset  . Migraines Sister    Prior to Admission medications   Medication Sig Start Date End Date Taking? Authorizing Provider  albuterol (PROVENTIL) (2.5 MG/3ML) 0.083% nebulizer solution Take 3 mLs (2.5 mg  total) by nebulization every 4 (four) hours as needed for wheezing or shortness of breath. 62/6/94   Delora Fuel, MD  albuterol (VENTOLIN HFA) 108 (90 Base) MCG/ACT inhaler INHALE 1-2 PUFFS INTO THE LUNGS EVERY 6 (SIX) HOURS AS NEEDED FOR WHEEZING OR SHORTNESS OF BREATH. 03/07/20   Charlott Rakes, MD  amLODipine (NORVASC) 10 MG tablet Take 1 tablet (10 mg total) by mouth daily. 11/19/19   Charlott Rakes, MD  baclofen (LIORESAL) 10 MG tablet baclofen 10 mg tablet    [provider]  cyclobenzaprine (FLEXERIL) 10 MG tablet TAKE 1 TABLET (10 MG TOTAL) BY MOUTH 3 (THREE) TIMES DAILY AS NEEDED FOR MUSCLE SPASMS. 12/27/19   Charlott Rakes, MD  DEXILANT 60 MG capsule Take 1 capsule by mouth daily. 09/19/20   [provider]  ergocalciferol (VITAMIN D2) 1.25 MG (50000 UT) capsule Vitamin D2 1,250 mcg (50,000 unit) capsule    [provider]  Fenofibric Acid (FIBRICOR) 105 MG TABS Fibricor 105 mg tablet  Take 1 tablet every day by oral route.    [provider]  HYDROcodone-acetaminophen (NORCO/VICODIN) 5-325 MG tablet Take 1 tablet by mouth every 6 (six) hours as needed for severe pain. 07/21/20   Robinson, [place] N, PA-C  HYDROcodone-homatropine (HYCODAN) 5-1.5 MG/5ML syrup hydrocodone-homatropine 5 mg-1.5 mg/5 mL oral syrup    [provider]  Ibuprofen-Diphenhydramine HCl (ADVIL PM) 200-25 MG CAPS Take 1-2 tablets by mouth at bedtime as needed (sleep).    [provider]  ipratropium (ATROVENT) 0.02 % nebulizer solution ipratropium bromide 0.02 % solution for inhalation  Inhale 2.5 mL as needed by inhalation route for 1 day.    [provider]  methocarbamol (ROBAXIN) 500 MG tablet Take 1,000 mg by mouth 4 (four) times daily as needed. 09/19/20   [provider]  omeprazole (PRILOSEC) 20 MG capsule Take 1 capsule (20 mg total) by mouth daily. 07/21/20   Robinson, [place] N, PA-C  ondansetron (ZOFRAN ODT) 4 MG disintegrating tablet Take 1  tablet (4 mg total) by mouth every 8 (eight) hours as needed for nausea or vomiting. 07/21/20   Robinson, [place] N, PA-C  oxyCODONE-acetaminophen (PERCOCET/ROXICET) 5-325 MG tablet SMARTSIG:1 By Mouth 4-5 Times Daily 10/03/20   [provider]  predniSONE (DELTASONE) 20 MG tablet Take 2 tablets (40 mg total) by mouth daily. 10/08/20   Little, Wenda Overland, MD  promethazine (PHENERGAN) 12.5 MG tablet promethazine 12.5 mg tablet    [provider]  rosuvastatin (CRESTOR) 5 MG tablet rosuvastatin 5 mg tablet  TAKE 1 TABLET BY MOUTH ONCE DAILY    [provider]  sucralfate (CARAFATE) 1 GM/10ML suspension Take 10 mLs (1 g total) by mouth 4 (four) times daily -  with meals and at bedtime. 07/21/20   Robinson, [place] N, PA-C  SUMAtriptan (IMITREX) 100 MG tablet sumatriptan 100 mg tablet    [provider]  SYMBICORT 160-4.5 MCG/ACT inhaler INHALE 2 PUFFS INTO THE LUNGS 2 (TWO) TIMES DAILY. 03/07/20   Charlott Rakes, MD  terbinafine (LAMISIL) 250 MG tablet terbinafine HCl 250 mg tablet  TAKE 1 TABLET BY MOUTH ONCE DAILY  [provider]  topiramate (TOPAMAX) 50 MG tablet Take 1 tablet (50 mg total) by mouth 2 (two) times daily. 11/19/19   Charlott Rakes, MD  Ubrogepant (UBRELVY) 100 MG TABS Ubrelvy 100 mg tablet  Take 1 tablet every day by oral route as needed.    [provider]  valsartan (DIOVAN) 160 MG tablet valsartan 160 mg tablet    [provider]  verapamil (CALAN-SR) 180 MG CR tablet verapamil ER (SR) 180 mg tablet,extended release    [provider]  zolpidem (AMBIEN) 10 MG tablet zolpidem 10 mg tablet  Take 1 tablet every day by oral route at bedtime for 30 days.    [provider]  fluticasone (FLONASE) 50 MCG/ACT nasal spray Place 1 spray into both nostrils daily. Patient not taking: Reported on 03/21/2019 12/29/17 06/29/19  Amaryllis Dyke, PA-C   Physical Exam: Vitals:   02/16/21 1605 02/16/21 1630  02/16/21 1904 02/16/21 1913  BP:  136/69 (!) 143/85   Pulse: 97 (!) 106 (!) 109   Resp: 16 16  18   Temp:    98.6 F (37 C)  TempSrc:    Oral  SpO2: 99% 100% (!) 77%   Weight:    117.1 kg  Height:    5\' 4"  (1.626 m)   Constitutional: Looks chronically ill, but in NAD. Eyes: PERRL, sclerae, lids and conjunctivae are pale. ENMT: Mucous membranes are moist. Posterior pharynx clear of any exudate or lesions. Neck: normal, supple, no masses, no thyromegaly Respiratory: clear to auscultation bilaterally, no wheezing, no crackles. Normal respiratory effort. No accessory muscle use.  Cardiovascular: Regular rate and rhythm, no murmurs / rubs / gallops. No extremity edema. 2+ pedal pulses. No carotid bruits.  Abdomen: Obese, no distention.  Bowel sounds positive.  Soft, positive RUQ and epigastric tenderness, no masses palpated. No hepatosplenomegaly.  Musculoskeletal: no clubbing / cyanosis. Good ROM, no contractures. Normal muscle tone.  Skin: Several hyperpigmented lesions on pretibial area and upper back. Neurologic: CN 2-12 grossly intact. Sensation intact, DTR normal. Strength 5/5 in all 4.  Psychiatric: Normal judgment and insight. Alert and oriented x 3. Normal mood.   Labs on Admission: I have personally reviewed following labs and imaging studies  CBC: Recent Labs  Lab 02/16/21 1224  WBC 12.9*  HGB 5.8*  HCT 19.8*  MCV 69.0*  PLT 557*    Basic Metabolic Panel: Recent Labs  Lab 02/16/21 1224  NA 137  K 3.8  CL 102  CO2 23  GLUCOSE 116*  BUN 14  CREATININE 0.77  CALCIUM 9.7   GFR: Estimated Creatinine Clearance: 103.5 mL/min (by C-G formula based on SCr of 0.77 mg/dL).  Liver Function Tests: Recent Labs  Lab 02/16/21 1224  AST 29  ALT 20  ALKPHOS 82  BILITOT 0.2*  PROT 7.6  ALBUMIN 3.7   Urine analysis:    Component Value Date/Time   COLORURINE YELLOW 02/16/2021 1224   APPEARANCEUR CLEAR 02/16/2021 1224   LABSPEC 1.015 02/16/2021 1224   PHURINE 6.0  02/16/2021 1224   GLUCOSEU NEGATIVE 02/16/2021 1224   HGBUR NEGATIVE 02/16/2021 1224   BILIRUBINUR SMALL (A) 02/16/2021 1224   KETONESUR NEGATIVE 02/16/2021 1224   PROTEINUR NEGATIVE 02/16/2021 1224   UROBILINOGEN 0.2 09/08/2014 1800   NITRITE NEGATIVE 02/16/2021 1224   LEUKOCYTESUR NEGATIVE 02/16/2021 1224   Radiological Exams on Admission: CT ABDOMEN PELVIS WO CONTRAST  Result Date: 02/16/2021 CLINICAL DATA:  Abdominal distension. EXAM: CT ABDOMEN AND PELVIS WITHOUT CONTRAST TECHNIQUE: Multidetector CT  imaging of the abdomen and pelvis was performed following the standard protocol without IV contrast. COMPARISON:  None recent FINDINGS: Lower chest: The lung bases are clear.There is a possible filling defect within the right ventricle (axial series 2, image 4). The intracardiac blood pool is hypodense relative to the adjacent myocardium consistent with anemia. Hepatobiliary: Multiple large hypoechoic masses are noted in the patient's liver. The largest is located in the left hepatic lobe measures approximately 10 cm. The gallbladder is not well evaluated.There is no biliary ductal dilation. Pancreas: There is an ill-defined masslike area in the pancreatic head measuring approximately 4.7 cm (axial series 2, image 41). Spleen: Unremarkable. Adrenals/Urinary Tract: --Adrenal glands: Unremarkable. --Right kidney/ureter: No hydronephrosis or radiopaque kidney stones. --Left kidney/ureter: There is a punctate nonobstructing stone in the upper pole the left kidney. --Urinary bladder: Unremarkable. Stomach/Bowel: --Stomach/Duodenum: No hiatal hernia or other gastric abnormality. Normal duodenal course and caliber. --Small bowel: Unremarkable. --Colon: Unremarkable. --Appendix: Normal. Vascular/Lymphatic: Atherosclerotic calcification is present within the non-aneurysmal abdominal aorta, without hemodynamically significant stenosis. --No retroperitoneal lymphadenopathy. --No mesenteric lymphadenopathy. --No  pelvic or inguinal lymphadenopathy. Reproductive: Status post hysterectomy. No adnexal mass. Other: No ascites or free air. The abdominal wall is normal. Musculoskeletal. No acute displaced fractures. IMPRESSION: 1. Multiple large hypoechoic masses in the patient's liver, consistent with metastatic disease. These are amenable to percutaneous biopsy as clinically indicated. 2. Ill-defined masslike area in the pancreatic head measuring approximately 4.7 cm. This is concerning for pancreatic adenocarcinoma. Exam limited by lack of IV contrast. A follow-up contrast enhanced MRI would be useful for further evaluation and staging. 3. Possible filling defect within the right ventricle. Recommend further evaluation with a dedicated cardiac echo. 4. Anemia. 5. Punctate nonobstructing stone in the upper pole of the left kidney. Aortic Atherosclerosis (ICD10-I70.0). Electronically Signed   By: Constance Holster M.D.   On: 02/16/2021 16:04   EKG: Independently reviewed. Vent. rate 93 BPM PR interval * ms QRS duration 85 ms QT/QTc 344/428 ms P-R-T axes 68 9 -3 Sinus rhythm Borderline T abnormalities, anterior leads  Assessment/Plan Principal Problem:   GI bleeding Admit to progressive unit/inpatient. Continue PRBC transfusion. N.p.o. after midnight. Monitor hematocrit and hemoglobin. Consult gastroenterology in a.m.  Active Problems:   Pancreatic mass   Liver masses Highly suspicious for malignancy. Consult IR for biopsy in a.m. Consult gastroenterology in a.m.    Symptomatic anemia Obtaining anemia panel before transfusion. Transfuse 2 units of PRBCs. Consult GI in a.m.    Hypertension Continue amlodipine 10 mg p.o. daily per Monitor blood pressure.    High cholesterol/Aortic atherosclerosis (HCC) Continue rosuvastatin.    Hypothyroidism Not on levothyroxine at the moment. Check TSH level.    Class 3 obesity with current BMI at 44.30 kg/m Lifestyle modifications. Losing weight due  to malignancy.    COPD (chronic obstructive pulmonary disease) ([place]) Supplemental oxygen as needed. Bronchodilators as needed.    DVT prophylaxis: SCDs. Code Status:   Full code. Family Communication: Disposition Plan:   Patient is from:  Home.  Anticipated DC to:  Home.  Anticipated DC date:  02/18/2021 or 02/19/2021.Marland Kitchen  Anticipated DC barriers: Clinical status and consultants sign off.  Consults called: Admission status:  Inpatient/progressive unit.   Severity of Illness: High due to developing symptomatic anemia associated with 2 months of abdominal pain with bloating, frequent nausea and emesis, weight loss of 38 pounds and significant hypersomnolence over the past few days secondary to anemia.  The patient will need to remain in the hospital  for further evaluation, including liver lesions biopsy.  Reubin Milan MD [facility]  How to contact the [facility] Attending or Consulting provider [place] or covering provider during after hours [place], for this patient?   1. Check the care team in [facility] and look for a) attending/consulting [facility] provider listed and b) the [facility] team listed 2. Log into [email or web address] and use [facility]'s universal password to access. If you do not have the password, please contact the hospital operator. 3. Locate the [facility] provider you are looking for under [facility] and page to a number that you can be directly reached. 4. If you still have difficulty reaching the provider, please page the [facility] (Director on Call) for the Hospitalists listed on amion for assistance.  02/16/2021, 8:39 PM   This document was prepared using Dragon voice recognition software and may contain some unintended transcription errors.

## 2021-02-16 NOTE — ED Notes (Signed)
Patient transported to CT 

## 2021-02-16 NOTE — ED Provider Notes (Signed)
[facility] EMERGENCY DEPARTMENT Provider Note   CSN: [id number] Arrival date & time: 02/16/21  1153     History Chief Complaint  Patient presents with  . Abdominal Pain  . Emesis    Wendy Hoffman is a 55 y.o. female.  HPI Patient reports over a month she is been having problems with getting epigastric pain with eating.  She reports frequently now when she eats she will vomit after she eats.  She reports she has not seen any blood in the vomit.  It is usually just the food immediately after she eats.  She reports sometimes things will stay down like Jell-O but more recently even small meals cause vomiting and epigastric pain.  Patient reports that she used to have regular bowel movements once a day but lately she is noting more constipation.  She has not seen any blood in her stool.  She reports she just become increasingly fatigued over the past several weeks.  She reports now if she lies down she can easily sleep for hours and still feel fatigued.  She reports even minor activity feels exhausting.  He denies chest pain or shortness of breath.  No fevers no chills.  She reports she has had unintentional 38 pounds of weight loss.  She reports she does have a history of iron deficiency anemia.  She reports a very long time ago she got a blood transfusion once.  She denies that she takes any iron supplements.    Past Medical History:  Diagnosis Date  . Anemia   . Anxiety   . Arthritis   . Asthma   . Cancer ([place])    cervic  . H/O: hysterectomy   . High cholesterol   . Hypertension   . Hypothyroidism   . Insomnia   . Medical history non-contributory   . Migraine   . Morbid obesity ([place])   . Shortness of breath   . Stroke ([place])   . Thyroid disease     Patient Active Problem List   Diagnosis Date Noted  . Snoring 05/03/2016  . Morbid obesity ([place]) 04/25/2014  . Asthma exacerbation 04/22/2014  . Asthma attack 04/22/2014  . Asthma with acute exacerbation 11/03/2013   . Hypothyroidism 11/03/2013  . Tobacco abuse 11/03/2013  . Migraine 09/22/2012  . Weakness of right side of body 09/22/2012  . Thyroid disease   . Hypertension   . High cholesterol   . H/O: hysterectomy   . Asthma 11/26/2008  . HEADACHE, CHRONIC 11/26/2008    Past Surgical History:  Procedure Laterality Date  . ABDOMINAL HYSTERECTOMY    . CESAREAN SECTION     3 occasions  . CHOLECYSTECTOMY    . THYROIDECTOMY, PARTIAL     Goiter     OB History   No obstetric history on file.     Family History  Problem Relation Age of Onset  . Migraines Sister     Social History   Tobacco Use  . Smoking status: Former Smoker    Types: Cigarettes  . Smokeless tobacco: Never Used  Vaping Use  . Vaping Use: Never used  Substance Use Topics  . Alcohol use: No  . Drug use: No    Home Medications Prior to Admission medications   Medication Sig Start Date End Date Taking? Authorizing Provider  albuterol (PROVENTIL) (2.5 MG/3ML) 0.083% nebulizer solution Take 3 mLs (2.5 mg total) by nebulization every 4 (four) hours as needed for wheezing or shortness of breath. 09/19/19  Delora Fuel, MD  albuterol (VENTOLIN HFA) 108 (90 Base) MCG/ACT inhaler INHALE 1-2 PUFFS INTO THE LUNGS EVERY 6 (SIX) HOURS AS NEEDED FOR WHEEZING OR SHORTNESS OF BREATH. 03/07/20   Charlott Rakes, MD  amLODipine (NORVASC) 10 MG tablet Take 1 tablet (10 mg total) by mouth daily. 11/19/19   Charlott Rakes, MD  baclofen (LIORESAL) 10 MG tablet baclofen 10 mg tablet    [provider]  cyclobenzaprine (FLEXERIL) 10 MG tablet TAKE 1 TABLET (10 MG TOTAL) BY MOUTH 3 (THREE) TIMES DAILY AS NEEDED FOR MUSCLE SPASMS. 12/27/19   Charlott Rakes, MD  DEXILANT 60 MG capsule Take 1 capsule by mouth daily. 09/19/20   [provider]  ergocalciferol (VITAMIN D2) 1.25 MG (50000 UT) capsule Vitamin D2 1,250 mcg (50,000 unit) capsule    [provider]  Fenofibric Acid (FIBRICOR) 105 MG TABS Fibricor 105 mg  tablet  Take 1 tablet every day by oral route.    [provider]  HYDROcodone-acetaminophen (NORCO/VICODIN) 5-325 MG tablet Take 1 tablet by mouth every 6 (six) hours as needed for severe pain. 07/21/20   Robinson, [place] N, PA-C  HYDROcodone-homatropine (HYCODAN) 5-1.5 MG/5ML syrup hydrocodone-homatropine 5 mg-1.5 mg/5 mL oral syrup    [provider]  Ibuprofen-Diphenhydramine HCl (ADVIL PM) 200-25 MG CAPS Take 1-2 tablets by mouth at bedtime as needed (sleep).    [provider]  ipratropium (ATROVENT) 0.02 % nebulizer solution ipratropium bromide 0.02 % solution for inhalation  Inhale 2.5 mL as needed by inhalation route for 1 day.    [provider]  methocarbamol (ROBAXIN) 500 MG tablet Take 1,000 mg by mouth 4 (four) times daily as needed. 09/19/20   [provider]  omeprazole (PRILOSEC) 20 MG capsule Take 1 capsule (20 mg total) by mouth daily. 07/21/20   Robinson, [place] N, PA-C  ondansetron (ZOFRAN ODT) 4 MG disintegrating tablet Take 1 tablet (4 mg total) by mouth every 8 (eight) hours as needed for nausea or vomiting. 07/21/20   Robinson, [place] N, PA-C  oxyCODONE-acetaminophen (PERCOCET/ROXICET) 5-325 MG tablet SMARTSIG:1 By Mouth 4-5 Times Daily 10/03/20   [provider]  predniSONE (DELTASONE) 20 MG tablet Take 2 tablets (40 mg total) by mouth daily. 10/08/20   Little, Wenda Overland, MD  promethazine (PHENERGAN) 12.5 MG tablet promethazine 12.5 mg tablet    [provider]  rosuvastatin (CRESTOR) 5 MG tablet rosuvastatin 5 mg tablet  TAKE 1 TABLET BY MOUTH ONCE DAILY    [provider]  sucralfate (CARAFATE) 1 GM/10ML suspension Take 10 mLs (1 g total) by mouth 4 (four) times daily -  with meals and at bedtime. 07/21/20   Robinson, [place] N, PA-C  SUMAtriptan (IMITREX) 100 MG tablet sumatriptan 100 mg tablet    [provider]  SYMBICORT 160-4.5 MCG/ACT inhaler INHALE 2 PUFFS INTO THE LUNGS 2 (TWO) TIMES DAILY.  03/07/20   Charlott Rakes, MD  terbinafine (LAMISIL) 250 MG tablet terbinafine HCl 250 mg tablet  TAKE 1 TABLET BY MOUTH ONCE DAILY    [provider]  topiramate (TOPAMAX) 50 MG tablet Take 1 tablet (50 mg total) by mouth 2 (two) times daily. 11/19/19   Charlott Rakes, MD  Ubrogepant (UBRELVY) 100 MG TABS Ubrelvy 100 mg tablet  Take 1 tablet every day by oral route as needed.    [provider]  valsartan (DIOVAN) 160 MG tablet valsartan 160 mg tablet    [provider]  verapamil (CALAN-SR) 180 MG CR tablet verapamil ER (SR)  180 mg tablet,extended release    [provider]  zolpidem (AMBIEN) 10 MG tablet zolpidem 10 mg tablet  Take 1 tablet every day by oral route at bedtime for 30 days.    [provider]  fluticasone (FLONASE) 50 MCG/ACT nasal spray Place 1 spray into both nostrils daily. Patient not taking: Reported on 03/21/2019 12/29/17 06/29/19  Petrucelli, Glynda Jaeger, PA-C    Allergies    Blueberry [vaccinium angustifolium], Mango flavor, Aspirin, and Penicillins  Review of Systems   Review of Systems 10 systems reviewed and negative except as per HPI Physical Exam Updated Vital Signs BP (!) 109/59   Pulse 97   Temp 98.1 F (36.7 C) (Oral)   Resp 16   Ht 5\' 4"  (1.626 m)   Wt 118.4 kg   SpO2 99%   BMI 44.82 kg/m   Physical Exam Constitutional:      Comments: Alert and nontoxic.  No respiratory distress.  Pale in appearance.  Clear mental status.  HENT:     Head: Normocephalic and atraumatic.     Mouth/Throat:     Pharynx: Oropharynx is clear.  Eyes:     Extraocular Movements: Extraocular movements intact.  Cardiovascular:     Rate and Rhythm: Normal rate and regular rhythm.     Pulses: Normal pulses.     Heart sounds: Normal heart sounds.  Pulmonary:     Effort: Pulmonary effort is normal.     Breath sounds: Normal breath sounds.  Abdominal:     General: There is no distension.     Palpations: Abdomen is soft.      Tenderness: There is no abdominal tenderness. There is no guarding.  Genitourinary:    Comments: Stool is brown.  No melena.  Firm, apparent mass in the posterior rectum. Musculoskeletal:        General: No swelling or tenderness. Normal range of motion.     Right lower leg: No edema.     Left lower leg: No edema.  Skin:    General: Skin is warm and dry.  Neurological:     General: No focal deficit present.     Mental Status: She is oriented to person, place, and time.     Coordination: Coordination normal.  Psychiatric:        Mood and Affect: Mood normal.     ED Results / Procedures / Treatments   Labs (all labs ordered are listed, but only abnormal results are displayed) Labs Reviewed  LIPASE, BLOOD - Abnormal; Notable for the following components:      Result Value   Lipase 266 (*)    All other components within normal limits  COMPREHENSIVE METABOLIC PANEL - Abnormal; Notable for the following components:   Glucose, Bld 116 (*)    Total Bilirubin 0.2 (*)    All other components within normal limits  CBC - Abnormal; Notable for the following components:   WBC 12.9 (*)    RBC 2.87 (*)    Hemoglobin 5.8 (*)    HCT 19.8 (*)    MCV 69.0 (*)    MCH 20.2 (*)    MCHC 29.3 (*)    RDW 21.1 (*)    Platelets 557 (*)    nRBC 0.3 (*)    All other components within normal limits  URINALYSIS, ROUTINE W REFLEX MICROSCOPIC - Abnormal; Notable for the following components:   Bilirubin Urine SMALL (*)    All other components within normal limits  OCCULT BLOOD X  1 CARD TO LAB, STOOL - Abnormal; Notable for the following components:   Fecal Occult Bld POSITIVE (*)    All other components within normal limits  RESP PANEL BY RT-PCR (FLU A&B, COVID) [id number]  PREGNANCY, URINE    EKG EKG Interpretation  Date/Time:  Monday February 16 2021 13:31:59 EST Ventricular Rate:  93 PR Interval:    QRS Duration: 85 QT Interval:  344 QTC Calculation: 428 R Axis:   9 Text Interpretation: Sinus  rhythm Borderline T abnormalities, anterior leads no sig change from previous Confirmed by Charlesetta Shanks [phone]) on 02/16/2021 1:58:24 PM   Radiology CT ABDOMEN PELVIS WO CONTRAST  Result Date: 02/16/2021 CLINICAL DATA:  Abdominal distension. EXAM: CT ABDOMEN AND PELVIS WITHOUT CONTRAST TECHNIQUE: Multidetector CT imaging of the abdomen and pelvis was performed following the standard protocol without IV contrast. COMPARISON:  None recent FINDINGS: Lower chest: The lung bases are clear.There is a possible filling defect within the right ventricle (axial series 2, image 4). The intracardiac blood pool is hypodense relative to the adjacent myocardium consistent with anemia. Hepatobiliary: Multiple large hypoechoic masses are noted in the patient's liver. The largest is located in the left hepatic lobe measures approximately 10 cm. The gallbladder is not well evaluated.There is no biliary ductal dilation. Pancreas: There is an ill-defined masslike area in the pancreatic head measuring approximately 4.7 cm (axial series 2, image 41). Spleen: Unremarkable. Adrenals/Urinary Tract: --Adrenal glands: Unremarkable. --Right kidney/ureter: No hydronephrosis or radiopaque kidney stones. --Left kidney/ureter: There is a punctate nonobstructing stone in the upper pole the left kidney. --Urinary bladder: Unremarkable. Stomach/Bowel: --Stomach/Duodenum: No hiatal hernia or other gastric abnormality. Normal duodenal course and caliber. --Small bowel: Unremarkable. --Colon: Unremarkable. --Appendix: Normal. Vascular/Lymphatic: Atherosclerotic calcification is present within the non-aneurysmal abdominal aorta, without hemodynamically significant stenosis. --No retroperitoneal lymphadenopathy. --No mesenteric lymphadenopathy. --No pelvic or inguinal lymphadenopathy. Reproductive: Status post hysterectomy. No adnexal mass. Other: No ascites or free air. The abdominal wall is normal. Musculoskeletal. No acute displaced fractures.  IMPRESSION: 1. Multiple large hypoechoic masses in the patient's liver, consistent with metastatic disease. These are amenable to percutaneous biopsy as clinically indicated. 2. Ill-defined masslike area in the pancreatic head measuring approximately 4.7 cm. This is concerning for pancreatic adenocarcinoma. Exam limited by lack of IV contrast. A follow-up contrast enhanced MRI would be useful for further evaluation and staging. 3. Possible filling defect within the right ventricle. Recommend further evaluation with a dedicated cardiac echo. 4. Anemia. 5. Punctate nonobstructing stone in the upper pole of the left kidney. Aortic Atherosclerosis (ICD10-I70.0). Electronically Signed   By: Constance Holster M.D.   On: 02/16/2021 16:04    Procedures Procedures  Angiocath insertion Performed by: Charlesetta Shanks  Consent: Verbal consent obtained. Risks and benefits: risks, benefits and alternatives were discussed Time out: Immediately prior to procedure a "time out" was called to verify the correct patient, procedure, equipment, support staff and site/side marked as required.  Preparation: Patient was prepped and draped in the usual sterile fashion.  Vein Location: right AC  Yes Ultrasound Guided  Gauge: 20 G long  Normal blood return and flush without difficulty Patient tolerance: Patient tolerated the procedure well with no immediate complications.  CRITICAL CARE Performed by: Charlesetta Shanks   Total critical care time: 33 minutes  Critical care time was exclusive of separately billable procedures and treating other patients.  Critical care was necessary to treat or prevent imminent or life-threatening deterioration.  Critical care was time spent personally by me on the  following activities: development of treatment plan with patient and/or surrogate as well as nursing, discussions with consultants, evaluation of patient's response to treatment, examination of patient, obtaining history  from patient or surrogate, ordering and performing treatments and interventions, ordering and review of laboratory studies, ordering and review of radiographic studies, pulse oximetry and re-evaluation of patient's condition. Medications Ordered in ED Medications  iohexol (OMNIPAQUE) 300 MG/ML solution 100 mL (has no administration in time range)  pantoprazole (PROTONIX) injection 40 mg (40 mg Intravenous Given 02/16/21 1559)    ED Course  I have reviewed the triage vital signs and the nursing notes.  Pertinent labs & imaging results that were available during my care of the patient were reviewed by me and considered in my medical decision making (see chart for details).    MDM Rules/Calculators/A&P                          Patient presents with epigastric pain and vomiting after eating.  This has been incremental.  Patient reports has had weight loss over the past months.  Patient is anemic at 5.6.  This appears to be indolent as she does not have significant tachycardia or hypotension.  She is however having symptomatic anemia with severe persistent fatigue and dyspnea on exertion.  Patient will need to be admitted for treatment.  Screening CT scan obtained for sporadic abdominal pain with weight loss and anemia. Final Clinical Impression(s) / ED Diagnoses Final diagnoses:  Symptomatic anemia  Epigastric pain  Weight loss    Rx / DC Orders ED Discharge Orders    None       Charlesetta Shanks, MD 02/16/21 [phone]

## 2021-02-16 NOTE — ED Triage Notes (Signed)
Abdominal pain and vomiting for a month.

## 2021-02-16 NOTE — ED Notes (Signed)
Attempted [place] IV, unable to obtain

## 2021-02-16 NOTE — Progress Notes (Signed)
Pt arrived to unit in room 1423, MD paged

## 2021-02-16 NOTE — ED Provider Notes (Signed)
Patient care assumed at 1600. Patient here for evaluation of progressive abdominal pain, vomiting, weight loss and dyspnea on exertion. Lab significant for profound anemia. She is chemical positive with no gross blood on rectal examination. CT abdomen pelvis pending at time of signout.  CT scan with multiple hepatic masses concerning for metastatic process. Discussed with patient findings of studies. She is agreeable with admission for ongoing workup. Hospitalist consulted for admission.   Quintella Reichert, MD 02/16/21 1659

## 2021-02-16 NOTE — Progress Notes (Signed)
55 yo female with gi bleed hb 5.8 heme positive aasociated with nausea vomiting abdominal pain DOE and weight loss. Work up concerning for Metastatic disease. Ct abdomen multiple mets and pancreatic mass. ED physician did DRE felt may be a rectal mass,has right ventricle filling defect VSS Needs blood transfusion gi consult echo.. Admit in patient to progressive.

## 2021-02-17 ENCOUNTER — Inpatient Hospital Stay (HOSPITAL_COMMUNITY): Payer: Medicaid Other

## 2021-02-17 DIAGNOSIS — D649 Anemia, unspecified: Secondary | ICD-10-CM

## 2021-02-17 DIAGNOSIS — C250 Malignant neoplasm of head of pancreas: Secondary | ICD-10-CM

## 2021-02-17 DIAGNOSIS — R9431 Abnormal electrocardiogram [ECG] [EKG]: Secondary | ICD-10-CM

## 2021-02-17 DIAGNOSIS — I700 Atherosclerosis of aorta: Secondary | ICD-10-CM | POA: Diagnosis present

## 2021-02-17 DIAGNOSIS — R935 Abnormal findings on diagnostic imaging of other abdominal regions, including retroperitoneum: Secondary | ICD-10-CM

## 2021-02-17 DIAGNOSIS — K8689 Other specified diseases of pancreas: Secondary | ICD-10-CM

## 2021-02-17 DIAGNOSIS — K922 Gastrointestinal hemorrhage, unspecified: Secondary | ICD-10-CM

## 2021-02-17 DIAGNOSIS — R195 Other fecal abnormalities: Secondary | ICD-10-CM

## 2021-02-17 LAB — CBC
HCT: 24.4 % — ABNORMAL LOW (ref 36.0–46.0)
Hemoglobin: 7.3 g/dL — ABNORMAL LOW (ref 12.0–15.0)
MCH: 22.9 pg — ABNORMAL LOW (ref 26.0–34.0)
MCHC: 29.9 g/dL — ABNORMAL LOW (ref 30.0–36.0)
MCV: 76.5 fL — ABNORMAL LOW (ref 80.0–100.0)
Platelets: 365 10*3/uL (ref 150–400)
RBC: 3.19 MIL/uL — ABNORMAL LOW (ref 3.87–5.11)
RDW: 21.5 % — ABNORMAL HIGH (ref 11.5–15.5)
WBC: 10 10*3/uL (ref 4.0–10.5)
nRBC: 0.4 % — ABNORMAL HIGH (ref 0.0–0.2)

## 2021-02-17 LAB — COMPREHENSIVE METABOLIC PANEL
ALT: 20 U/L (ref 0–44)
AST: 27 U/L (ref 15–41)
Albumin: 3.5 g/dL (ref 3.5–5.0)
Alkaline Phosphatase: 87 U/L (ref 38–126)
Anion gap: 7 (ref 5–15)
BUN: 12 mg/dL (ref 6–20)
CO2: 26 mmol/L (ref 22–32)
Calcium: 9.5 mg/dL (ref 8.9–10.3)
Chloride: 104 mmol/L (ref 98–111)
Creatinine, Ser: 0.79 mg/dL (ref 0.44–1.00)
GFR, Estimated: >60 mL/min (ref >60)
Glucose, Bld: 101 mg/dL — ABNORMAL HIGH (ref 70–99)
Potassium: 3.9 mmol/L (ref 3.5–5.1)
Sodium: 137 mmol/L (ref 135–145)
Total Bilirubin: 1 mg/dL (ref 0.3–1.2)
Total Protein: 7.1 g/dL (ref 6.5–8.1)

## 2021-02-17 LAB — ECHOCARDIOGRAM COMPLETE
Area-P 1/2: 4.06 cm2
Calc EF: 62.1 %
Height: 64 in
S' Lateral: 3 cm
Single Plane A2C EF: 57 %
Single Plane A4C EF: 68.2 %
Weight: 4129.6 oz

## 2021-02-17 LAB — IRON AND TIBC
Iron: 16 ug/dL — ABNORMAL LOW (ref 28–170)
Saturation Ratios: 4 % — ABNORMAL LOW (ref 10.4–31.8)
TIBC: 400 ug/dL (ref 250–450)
UIBC: 384 ug/dL

## 2021-02-17 LAB — VITAMIN B12: Vitamin B-12: 330 pg/mL (ref 180–914)

## 2021-02-17 LAB — FOLATE: Folate: 12 ng/mL (ref >5.9)

## 2021-02-17 LAB — FERRITIN: Ferritin: 7 ng/mL — ABNORMAL LOW (ref 11–307)

## 2021-02-17 MED ORDER — HYDROMORPHONE HCL 1 MG/ML IJ SOLN
1.0000 mg | INTRAMUSCULAR | Status: DC | PRN
  Administered 2021-02-17 (×2): 1 mg via INTRAVENOUS
  Filled 2021-02-17 (×2): qty 1

## 2021-02-17 MED ORDER — ADULT MULTIVITAMIN W/MINERALS CH
1.0000 | ORAL_TABLET | Freq: Every day | ORAL | Status: DC
  Administered 2021-02-18 – 2021-02-28 (×6): 1 via ORAL
  Filled 2021-02-17 (×8): qty 1

## 2021-02-17 MED ORDER — HYDROCORTISONE 1 % EX CREA
TOPICAL_CREAM | Freq: Three times a day (TID) | CUTANEOUS | Status: DC | PRN
  Filled 2021-02-17 (×3): qty 28

## 2021-02-17 MED ORDER — HYDROMORPHONE HCL 1 MG/ML IJ SOLN
2.0000 mg | INTRAMUSCULAR | Status: DC | PRN
  Administered 2021-02-17: 2 mg via INTRAVENOUS
  Filled 2021-02-17: qty 2

## 2021-02-17 MED ORDER — ALBUTEROL SULFATE (2.5 MG/3ML) 0.083% IN NEBU: 2.5000 mg | INHALATION_SOLUTION | RESPIRATORY_TRACT | Status: DC | PRN

## 2021-02-17 MED ORDER — METOCLOPRAMIDE HCL 5 MG/ML IJ SOLN
5.0000 mg | Freq: Four times a day (QID) | INTRAMUSCULAR | Status: DC
  Administered 2021-02-17 – 2021-02-19 (×3): 5 mg via INTRAVENOUS
  Filled 2021-02-17 (×4): qty 2

## 2021-02-17 MED ORDER — PEG-KCL-NACL-NASULF-NA ASC-C 100 G PO SOLR
0.5000 | Freq: Once | ORAL | Status: AC
  Administered 2021-02-17: 100 g via ORAL
  Filled 2021-02-17: qty 1

## 2021-02-17 MED ORDER — OXYCODONE HCL 5 MG PO TABS
5.0000 mg | ORAL_TABLET | Freq: Once | ORAL | Status: AC
  Administered 2021-02-17: 5 mg via ORAL
  Filled 2021-02-17: qty 1

## 2021-02-17 MED ORDER — LIP MEDEX EX OINT
TOPICAL_OINTMENT | CUTANEOUS | Status: AC
  Filled 2021-02-17: qty 7

## 2021-02-17 MED ORDER — PEG-KCL-NACL-NASULF-NA ASC-C 100 G PO SOLR
0.5000 | Freq: Once | ORAL | Status: AC
  Administered 2021-02-18: 100 g via ORAL

## 2021-02-17 MED ORDER — GABAPENTIN 300 MG PO CAPS
300.0000 mg | ORAL_CAPSULE | Freq: Every day | ORAL | Status: DC
  Administered 2021-02-17 – 2021-02-27 (×11): 300 mg via ORAL
  Filled 2021-02-17 (×11): qty 1

## 2021-02-17 MED ORDER — MOMETASONE FURO-FORMOTEROL FUM 200-5 MCG/ACT IN AERO
2.0000 | INHALATION_SPRAY | Freq: Two times a day (BID) | RESPIRATORY_TRACT | Status: DC
  Administered 2021-02-17 – 2021-02-28 (×23): 2 via RESPIRATORY_TRACT
  Filled 2021-02-17: qty 8.8

## 2021-02-17 MED ORDER — SODIUM CHLORIDE 0.9 % IV SOLN: INTRAVENOUS | Status: DC

## 2021-02-17 MED ORDER — HYDROMORPHONE HCL 1 MG/ML IJ SOLN
1.5000 mg | Freq: Once | INTRAMUSCULAR | Status: DC
Start: 2021-02-17 — End: 2021-02-17

## 2021-02-17 MED ORDER — PROSOURCE PLUS PO LIQD
30.0000 mL | Freq: Three times a day (TID) | ORAL | Status: DC
  Administered 2021-02-18 – 2021-02-27 (×5): 30 mL via ORAL
  Filled 2021-02-17 (×7): qty 30

## 2021-02-17 MED ORDER — AMLODIPINE BESYLATE 10 MG PO TABS: 10.0000 mg | ORAL_TABLET | Freq: Every day | ORAL | Status: DC

## 2021-02-17 MED ORDER — PEG-KCL-NACL-NASULF-NA ASC-C 100 G PO SOLR: 1.0000 | Freq: Once | ORAL | Status: DC

## 2021-02-17 MED ORDER — HYDROMORPHONE HCL 1 MG/ML IJ SOLN
2.0000 mg | INTRAMUSCULAR | Status: DC | PRN
  Administered 2021-02-17 – 2021-02-25 (×37): 2 mg via INTRAVENOUS
  Filled 2021-02-17 (×39): qty 2

## 2021-02-17 NOTE — Progress Notes (Signed)
PROGRESS NOTE  Wendy Hoffman  DOB: 02/13/1966  PCP: Patient, No Pcp Per ZDG:[id number]  DOA: 02/16/2021  LOS: 1 day   Chief Complaint  Patient presents with  . Abdominal Pain  . Emesis   Brief narrative: Wendy Hoffman is a 55 y.o. female with PMH significant for morbid obesity, HTN, HLD, cervical cancer/hysterectomy, chronic iron deficiency anemia, anxiety, osteoarthritis, asthma/COPD, hypothyroidism, insomnia, migraine headaches.  Patient presented to the ED on 3/7 with nausea, vomiting, abdominal pain, bloating and 38 pound weight loss in 2 months. In the ED, fecal occult blood test was positive. CBC with a WBC count of 12.9, hemoglobin low at 5.8, it was 12.87 months ago Lipase elevated to 266 CT abdomen/pelvis without contrast showed multiple large hypoechoic masses in the patient's liver consistent with metastatic disease. There is an ill-defined masslike area in the pancreatic head measuring about 4.7 cm. MRI of the abdomen was suggested for further evaluation and staging.  There is a possible filling defect within the right ventricle.  Echocardiogram suggested.   Patient was admitted to hospitalist service  Subjective: Patient was seen and examined this morning.  Middle-aged African-American female.  Lying on bed.  Drowsy under the effect of pain medicine this morning. Chart reviewed No fever, hemodynamically stable Hemoglobin down at 5 this morning  Assessment/Plan: Suspicious pancreatic cancer with liver metastasis -Presented with abdominal pain, weight loss. -CT scan finding as above with pancreatic head mass of 4.7 cm with multiple liver mets -MRI abdomen was suggested for further evaluation and staging. -Possibly biopsy by IR  Acute on probably chronic GI bleeding Acute on chronic iron deficiency anemia -Hemoglobin low at 5 on admission. -2 units of PRBC transfused so far.  Repeat hemoglobin this afternoon.  Transfuse to keep hemoglobin more than 7. -GI  consultation requested. Recent Labs    07/21/20 1813 02/16/21 1224 02/16/21 2252 02/17/21 0845  HGB 12.8 5.8* 5.0* 7.3*  MCV 94.0 69.0*  --  76.5*  VITAMINB12  --   --  330  --   FOLATE  --   --  12.0  --   FERRITIN  --   --  7*  --   TIBC  --   --  400  --   IRON  --   --  16*  --   RETICCTPCT  --   --  1.7  --    Hypertension -Continue amlodipine 10 mg p.o. daily -We will hold for now.  Monitor blood pressure.  High cholesterol/Aortic atherosclerosis -Continue rosuvastatin.  Hypothyroidism -Continue Synthroid No results for input(s): TSH in the last 8760 hours.  Morbid obesity  -Body mass index is 44.3 kg/m. Patient has been advised to make an attempt to improve diet and exercise patterns to aid in weight loss.  COPD (chronic obstructive pulmonary disease) -Supplemental oxygen as needed. -Bronchodilators as needed.  Mobility: Encourage ambulation Code Status:   Code Status: Full Code  Nutritional status: Body mass index is 44.3 kg/m.     Diet Order            Diet clear liquid Room service appropriate? Yes; Fluid consistency: Thin  Diet effective now                 DVT prophylaxis: SCDs Start: 02/16/21 1723   Antimicrobials:  None Fluid: Normal saline at 75 mill per hour Consultants: GI Family Communication:  None at bedside  Status is: Inpatient  Remains inpatient appropriate because:Pending EGD   Dispo: The patient is from:  Home              Anticipated d/c is to: Home              Patient currently is not medically stable to d/c.   Difficult to place patient No       Infusions:  . sodium chloride 75 mL/hr at 02/17/21 0920    Scheduled Meds: . lip balm      . sodium chloride   Intravenous Once  . feeding supplement  1 Container Oral TID BM  . gabapentin  300 mg Oral QHS  . mometasone-formoterol  2 puff Inhalation BID  . pantoprazole (PROTONIX) IV  40 mg Intravenous Q12H    Antimicrobials: Anti-infectives (From admission,  onward)   None      PRN meds: albuterol, HYDROmorphone (DILAUDID) injection, iohexol, ondansetron **OR** ondansetron (ZOFRAN) IV   Objective: Vitals:   02/17/21 0820 02/17/21 0913  BP:    Pulse:    Resp:    Temp:    SpO2: 99% 99%    Intake/Output Summary (Last 24 hours) at 02/17/2021 1105 Last data filed at 02/17/2021 1048 Gross per 24 hour  Intake 702 ml  Output 300 ml  Net 402 ml   Filed Weights   02/16/21 1214 02/16/21 1913  Weight: 118.4 kg 117.1 kg   Weight change:  Body mass index is 44.3 kg/m.   Physical Exam: General exam: Pleasant, middle-aged African-American female.  Not in distress at this time Skin: No rashes, lesions or ulcers. HEENT: Atraumatic, normocephalic, no obvious bleeding Lungs: Clear to auscultation bilaterally CVS: Regular rate and rhythm, no murmur GI/Abd soft, epigastric tenderness present, nondistended, bowel sound present CNS: Drowsy from the effect of pain medicine, oriented x3 Psychiatry: Mood appropriate Extremities: No pedal edema, no calf tenderness  Data Review: I have personally reviewed the laboratory data and studies available.  Recent Labs  Lab 02/16/21 1224 02/16/21 2252 02/17/21 0845  WBC 12.9*  --  10.0  HGB 5.8* 5.0* 7.3*  HCT 19.8* 17.8* 24.4*  MCV 69.0*  --  76.5*  PLT 557*  --  365   Recent Labs  Lab 02/16/21 1224 02/16/21 2252 02/17/21 0845  NA 137  --  137  K 3.8  --  3.9  CL 102  --  104  CO2 23  --  26  GLUCOSE 116*  --  101*  BUN 14  --  12  CREATININE 0.77  --  0.79  CALCIUM 9.7  --  9.5  MG  --  2.1  --   PHOS  --  3.7  --     F/u labs ordered Unresulted Labs (From admission, onward)          Start     Ordered   02/17/21 0500  Comprehensive metabolic panel  Tomorrow morning,   R        02/16/21 1724   02/17/21 0500  CBC  Tomorrow morning,   R        02/16/21 1724   02/16/21 1723  HIV Antibody (routine testing w rflx)  (HIV Antibody (Routine testing w reflex) panel)  Once,   R         02/16/21 1724          Signed, Terrilee Croak, MD [facility] 02/17/2021

## 2021-02-17 NOTE — Consult Note (Addendum)
Referring Provider:  [facility]         Primary Care Physician:  Patient, No Pcp Per Primary Gastroenterologist: unassigned       We were asked to see this patient for:    Iron deficiency anemia             Attending physician's note   I have taken a history, examined the patient and reviewed the chart. I agree with the Advanced Practitioner's note, impression and recommendations.  55 year old very pleasant female admitted with abdominal pain, weight loss and profound iron deficiency anemia with heme positive stool.  CT scan concerning for ?  Pancreatic lesion and multiple hepatic lesions Will plan for EGD and colonoscopy for further evaluation of heme positive stool and iron deficiency anemia, rule out GI malignancy. If EGD and colonoscopy unrevealing for any high risk lesion, may need to consider EUS for further evaluation of pancreatic lesion  The risks and benefits as well as alternatives of endoscopic procedure(s) have been discussed and reviewed. All questions answered. The patient agrees to proceed.  Monitor hemoglobin and transfuse to greater than 7 Bowel prep N.p.o. after 5 AM    The patient was provided an opportunity to ask questions and all were answered. The patient agreed with the plan and demonstrated an understanding of the instructions.  Damaris Hippo , MD [phone]      ASSESSMENT / PLAN:   # 55 yo female with upper abdominal pain, nausea, vomiting, weight loss and profound iron deficiency anemia and heme positive stool in setting of CT scan showing pancreatic head mass but also multiple liver lesions concerning for metastatic disease.  --Patient will be scheduled for EGD and colonoscopy for evaluation of iron deficiency anemia.  Patient will be scheduled for a colonoscopy. The risks and benefits of EGD and colonoscopy with possible polypectomy / biopsies were discussed and the patient agrees to proceed.  --Further evaluation of pancreatic lesion  following endoscopic evaluation.  --Hgb improved from 5.8 to 7.3 post 2 units of PRBC  # Possible right ventricle filling defect on non-contrast CT scan  # [facility] of lymphoma in brother and nephew  # Chronic GERD, has taken PPI for years.      HPI:                                                                                                                             Chief Complaint: pancreatic mass, liver lesions, anemia  Wendy Hoffman is a 55 y.o. female with history of asthma, morbid obesity, hyperlipidemia, cervical cancer,  migraines, hypothyroidism, HTN, cholecystectomy  Patient [facility] to [facility] ED yesterday with abdominal pain,  Vomiting and recent unitentional weight loss of 30 pounds. In ED Hgb was 5.8, she was FOBT +, MCV 69,  Ferritin 7,  lipase 266, liver tests normal. Non -contrast CT scan shows ill defined pancreatic head mass measuring 4.7 cm and multiple liver lesions c/w metastatic disease.  Patient says she began having mid upper abdominal pain radiating through to her back about 3 months ago. Initially symptoms improved after starting Dexilant and " a liquid" ( ? Carafate). Symptoms slowly returned then a month ago she started having frequent nausea and vomiting. She also starting having constipation which is unusual for her since she generally has BMs after every meal. She isn't aware of any overt GI bleeding but then doesn't really pay attention. No vaginal bleeding or dysuria and she doesn't donate blood. No [place] of colon cancer. Patient says she has never had a colonoscopy.    PREVIOUS ENDOSCOPIC EVALUATIONS / PERTINENT STUDIES   02/16/21 Non contrast CT scan IMPRESSION: 1. Multiple large hypoechoic masses in the patient's liver, consistent with metastatic disease. These are amenable to percutaneous biopsy as clinically indicated. 2. Ill-defined masslike area in the pancreatic head measuring approximately 4.7 cm. This is concerning for pancreatic adenocarcinoma.  Exam limited by lack of IV contrast. A follow-up contrast enhanced MRI would be useful for further evaluation and staging. 3. Possible filling defect within the right ventricle. Recommend further evaluation with a dedicated cardiac echo. 4. Anemia. 5. Punctate nonobstructing stone in the upper pole of the left kidney.   Past Medical History:  Diagnosis Date  . Anemia   . Anxiety   . Arthritis   . Asthma   . Cancer ([place])    cervic  . H/O: hysterectomy   . High cholesterol   . Hypertension   . Hypothyroidism   . Insomnia   . Medical history non-contributory   . Migraine   . Morbid obesity ([place])   . Stroke ([place])   . Thyroid disease     Past Surgical History:  Procedure Laterality Date  . ABDOMINAL HYSTERECTOMY    . CESAREAN SECTION     3 occasions  . CHOLECYSTECTOMY    . THYROIDECTOMY, PARTIAL     Goiter    Family History  Problem Relation Age of Onset  . Migraines Sister     Prior to Admission medications   Medication Sig Start Date End Date Taking? Authorizing Provider  albuterol (PROVENTIL) (2.5 MG/3ML) 0.083% nebulizer solution Take 3 mLs (2.5 mg total) by nebulization every 4 (four) hours as needed for wheezing or shortness of breath. 86/5/78  Yes Delora Fuel, MD  albuterol (VENTOLIN HFA) 108 (90 Base) MCG/ACT inhaler INHALE 1-2 PUFFS INTO THE LUNGS EVERY 6 (SIX) HOURS AS NEEDED FOR WHEEZING OR SHORTNESS OF BREATH. 03/07/20  Yes Newlin, Enobong, MD  amLODipine (NORVASC) 10 MG tablet Take 1 tablet (10 mg total) by mouth daily. 11/19/19  Yes Charlott Rakes, MD  baclofen (LIORESAL) 10 MG tablet Take 10 mg by mouth 2 (two) times daily.   Yes [provider]  DEXILANT 60 MG capsule Take 1 capsule by mouth daily. 09/19/20  Yes [provider]  gabapentin (NEURONTIN) 300 MG capsule Take 300 mg by mouth at bedtime. 01/07/21  Yes [provider]  ipratropium (ATROVENT) 0.02 % nebulizer solution Take 0.25 mg by nebulization daily as needed for  wheezing.   Yes [provider]  methocarbamol (ROBAXIN) 750 MG tablet Take 750 mg by mouth 2 (two) times daily.   Yes [provider]  oxyCODONE-acetaminophen (PERCOCET/ROXICET) 5-325 MG tablet Take 1 tablet by mouth every 4 (four) hours as needed for moderate pain. 10/03/20  Yes [provider]  rosuvastatin (CRESTOR) 5 MG tablet Take 5 mg by mouth daily.   Yes [provider]  sucralfate (CARAFATE) 1  GM/10ML suspension Take 10 mLs (1 g total) by mouth 4 (four) times daily -  with meals and at bedtime. 07/21/20  Yes Robinson, [place] N, PA-C  SUMAtriptan (IMITREX) 100 MG tablet Take 100 mg by mouth daily as needed for migraine.   Yes [provider]  SYMBICORT 160-4.5 MCG/ACT inhaler INHALE 2 PUFFS INTO THE LUNGS 2 (TWO) TIMES DAILY. Patient taking differently: Inhale 2 puffs into the lungs 2 (two) times daily. 03/07/20  Yes Newlin, Charlane Ferretti, MD  zolpidem (AMBIEN) 10 MG tablet Take 10 mg by mouth daily.   Yes [provider]  cyclobenzaprine (FLEXERIL) 10 MG tablet TAKE 1 TABLET (10 MG TOTAL) BY MOUTH 3 (THREE) TIMES DAILY AS NEEDED FOR MUSCLE SPASMS. Patient not taking: No sig reported 12/27/19   Charlott Rakes, MD  HYDROcodone-acetaminophen (NORCO/VICODIN) 5-325 MG tablet Take 1 tablet by mouth every 6 (six) hours as needed for severe pain. Patient not taking: No sig reported 07/21/20   Robinson, [place] N, PA-C  omeprazole (PRILOSEC) 20 MG capsule Take 1 capsule (20 mg total) by mouth daily. Patient not taking: No sig reported 07/21/20   Robinson, [place] N, PA-C  ondansetron (ZOFRAN ODT) 4 MG disintegrating tablet Take 1 tablet (4 mg total) by mouth every 8 (eight) hours as needed for nausea or vomiting. Patient not taking: No sig reported 07/21/20   Robinson, [place] N, PA-C  predniSONE (DELTASONE) 20 MG tablet Take 2 tablets (40 mg total) by mouth daily. Patient not taking: No sig reported 10/08/20   Little, Wenda Overland, MD  topiramate (TOPAMAX) 50  MG tablet Take 1 tablet (50 mg total) by mouth 2 (two) times daily. Patient not taking: No sig reported 11/19/19   Charlott Rakes, MD  fluticasone (FLONASE) 50 MCG/ACT nasal spray Place 1 spray into both nostrils daily. Patient not taking: Reported on 03/21/2019 12/29/17 06/29/19  Petrucelli, Glynda Jaeger, PA-C    Current Facility-Administered Medications  Medication Dose Route Frequency Provider Last Rate Last Admin  . lip balm (CARMEX) ointment           . 0.9 %  sodium chloride infusion (Manually program via Guardrails IV Fluids)   Intravenous Once Reubin Milan, MD      . 0.9 %  sodium chloride infusion   Intravenous Continuous Terrilee Croak, MD 75 mL/hr at 02/17/21 0920 New Bag at 02/17/21 0920  . albuterol (PROVENTIL) (2.5 MG/3ML) 0.083% nebulizer solution 2.5 mg  2.5 mg Nebulization Q4H PRN Reubin Milan, MD      . feeding supplement (BOOST / RESOURCE BREEZE) liquid 1 Container  1 Container Oral TID BM Georgette Shell, MD      . gabapentin (NEURONTIN) capsule 300 mg  300 mg Oral QHS Reubin Milan, MD      . HYDROmorphone (DILAUDID) injection 2 mg  2 mg Intravenous Q3H PRN Terrilee Croak, MD   2 mg at 02/17/21 0921  . iohexol (OMNIPAQUE) 300 MG/ML solution 100 mL  100 mL Intravenous Once PRN Charlesetta Shanks, MD      . mometasone-formoterol [facility]) 200-5 MCG/ACT inhaler 2 puff  2 puff Inhalation BID Reubin Milan, MD   2 puff at 02/17/21 0913  . ondansetron (ZOFRAN) tablet 4 mg  4 mg Oral Q6H PRN Reubin Milan, MD   4 mg at 02/17/21 0040   Or  . ondansetron [facility] injection 4 mg  4 mg Intravenous Q6H PRN Reubin Milan, MD   4 mg at 02/17/21 0825  . pantoprazole (PROTONIX)  injection 40 mg  40 mg Intravenous Q12H Reubin Milan, MD   40 mg at 02/17/21 4431    Allergies as of 02/16/2021 - Review Complete 02/16/2021  Allergen Reaction Noted  . 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 Mango flavor Anaphylaxis 04/30/2013  .  Aspirin Rash   . Penicillins Hives     Family History  Problem Relation Age of Onset  . Migraines Sister     Social History   Socioeconomic History  . Marital status: Single    Spouse name: Not on file  . Number of children: Not on file  . Years of education: Not on file  . Highest education level: Not on file  Occupational History  . Not on file  Tobacco Use  . Smoking status: Former Smoker    Types: Cigarettes  . Smokeless tobacco: Never Used  Vaping Use  . Vaping Use: Never used  Substance and Sexual Activity  . Alcohol use: No  . Drug use: No  . Sexual activity: Not on file  Other Topics Concern  . Not on file  Social History Narrative  . Not on file   Social Determinants of Health   Financial Resource Strain: Not on file  Food Insecurity: Not on file  Transportation Needs: Not on file  Physical Activity: Not on file  Stress: Not on file  Social Connections: Not on file  Intimate Partner Violence: Not on file    Review of Systems: All systems reviewed and negative except where noted in HPI.  OBJECTIVE:    Physical Exam: Vital signs in last 24 hours: Temp:  [97.8 F (36.6 C)-98.6 F (37 C)] 97.8 F (36.6 C) (03/08 0800) Pulse Rate:  [69-109] 69 (03/08 0800) Resp:  [11-25] 17 (03/08 0800) BP: (106-143)/(57-93) 117/77 (03/08 0800) SpO2:  [77 %-100 %] 99 % (03/08 0913) Weight:  [117.1 kg-118.4 kg] 117.1 kg (03/07 1913)   General:   Alert obese female in NAD.  Psych:  Pleasant, cooperative. Normal mood and affect. Eyes:  Pupils equal, sclera clear, no icterus.   Conjunctiva pink. Ears:  Normal auditory acuity. Nose:  No deformity, discharge,  or lesions. Neck:  Supple; no masses Lungs:  Clear throughout to auscultation.   No wheezes, crackles, or rhonchi.  Heart:  Regular rate and rhythm;  no lower extremity edema Abdomen:  Soft, non-distended, nontender, BS active, no palp mass   Rectal:  Deferred  Msk:  Symmetrical without gross deformities.  . Neurologic:  Alert and  oriented x4;  grossly normal neurologically. Skin:  Intact without significant lesions or rashes.  Filed Weights   02/16/21 1214 02/16/21 1913  Weight: 118.4 kg 117.1 kg     Scheduled inpatient medications . lip balm      . sodium chloride   Intravenous Once  . feeding supplement  1 Container Oral TID BM  . gabapentin  300 mg Oral QHS  . mometasone-formoterol  2 puff Inhalation BID  . pantoprazole (PROTONIX) IV  40 mg Intravenous Q12H      Intake/Output from previous day: 03/07 0701 - 03/08 0700 In: 702 [Blood:702] Out: -  Intake/Output this shift: Total I/O In: -  Out: 300 [Urine:300]   Lab Results: Recent Labs    02/16/21 1224 02/16/21 2252 02/17/21 0845  WBC 12.9*  --  10.0  HGB 5.8* 5.0* 7.3*  HCT 19.8* 17.8* 24.4*  PLT 557*  --  365   BMET Recent Labs    02/16/21 1224 02/17/21 0845  NA 137 137  K 3.8 3.9  CL 102 104  CO2 23 26  GLUCOSE 116* 101*  BUN 14 12  CREATININE 0.77 0.79  CALCIUM 9.7 9.5   LFT Recent Labs    02/16/21 2252 02/17/21 0845  PROT 7.0 7.1  ALBUMIN 3.3* 3.5  AST 24 27  ALT 19 20  ALKPHOS 83 87  BILITOT 0.5 1.0  BILIDIR <0.1  --   IBILI NOT CALCULATED  --    PT/INR No results for input(s): LABPROT, INR in the last 72 hours. Hepatitis Panel No results for input(s): HEPBSAG, HCVAB, HEPAIGM, HEPBIGM in the last 72 hours.   . CBC Latest Ref Rng & Units 02/17/2021 02/16/2021 02/16/2021  WBC 4.0 - 10.5 K/uL 10.0 - 12.9(H)  Hemoglobin 12.0 - 15.0 g/dL 7.3(L) 5.0(LL) 5.8(LL)  Hematocrit 36.0 - 46.0 % 24.4(L) 17.8(L) 19.8(L)  Platelets 150 - 400 K/uL 365 - 557(H)    . CMP Latest Ref Rng & Units 02/17/2021 02/16/2021 02/16/2021  Glucose 70 - 99 mg/dL 101(H) - 116(H)  BUN 6 - 20 mg/dL 12 - 14  Creatinine 0.44 - 1.00 mg/dL 0.79 - 0.77  Sodium 135 - 145 mmol/L 137 - 137  Potassium 3.5 - 5.1 mmol/L 3.9 - 3.8  Chloride 98 - 111 mmol/L 104 - 102  CO2 22 - 32 mmol/L 26 - 23  Calcium 8.9 - 10.3 mg/dL 9.5 -  9.7  Total Protein 6.5 - 8.1 g/dL 7.1 7.0 7.6  Total Bilirubin 0.3 - 1.2 mg/dL 1.0 0.5 0.2(L)  Alkaline Phos 38 - 126 U/L 87 83 82  AST 15 - 41 U/L 27 24 29   ALT 0 - 44 U/L 20 19 20    Studies/Results: CT ABDOMEN PELVIS WO CONTRAST  Result Date: 02/16/2021 CLINICAL DATA:  Abdominal distension. EXAM: CT ABDOMEN AND PELVIS WITHOUT CONTRAST TECHNIQUE: Multidetector CT imaging of the abdomen and pelvis was performed following the standard protocol without IV contrast. COMPARISON:  None recent FINDINGS: Lower chest: The lung bases are clear.There is a possible filling defect within the right ventricle (axial series 2, image 4). The intracardiac blood pool is hypodense relative to the adjacent myocardium consistent with anemia. Hepatobiliary: Multiple large hypoechoic masses are noted in the patient's liver. The largest is located in the left hepatic lobe measures approximately 10 cm. The gallbladder is not well evaluated.There is no biliary ductal dilation. Pancreas: There is an ill-defined masslike area in the pancreatic head measuring approximately 4.7 cm (axial series 2, image 41). Spleen: Unremarkable. Adrenals/Urinary Tract: --Adrenal glands: Unremarkable. --Right kidney/ureter: No hydronephrosis or radiopaque kidney stones. --Left kidney/ureter: There is a punctate nonobstructing stone in the upper pole the left kidney. --Urinary bladder: Unremarkable. Stomach/Bowel: --Stomach/Duodenum: No hiatal hernia or other gastric abnormality. Normal duodenal course and caliber. --Small bowel: Unremarkable. --Colon: Unremarkable. --Appendix: Normal. Vascular/Lymphatic: Atherosclerotic calcification is present within the non-aneurysmal abdominal aorta, without hemodynamically significant stenosis. --No retroperitoneal lymphadenopathy. --No mesenteric lymphadenopathy. --No pelvic or inguinal lymphadenopathy. Reproductive: Status post hysterectomy. No adnexal mass. Other: No ascites or free air. The abdominal wall is  normal. Musculoskeletal. No acute displaced fractures. IMPRESSION: 1. Multiple large hypoechoic masses in the patient's liver, consistent with metastatic disease. These are amenable to percutaneous biopsy as clinically indicated. 2. Ill-defined masslike area in the pancreatic head measuring approximately 4.7 cm. This is concerning for pancreatic adenocarcinoma. Exam limited by lack of IV contrast. A follow-up contrast enhanced MRI would be useful for further evaluation and staging. 3. Possible filling defect within the right ventricle.  Recommend further evaluation with a dedicated cardiac echo. 4. Anemia. 5. Punctate nonobstructing stone in the upper pole of the left kidney. Aortic Atherosclerosis (ICD10-I70.0). Electronically Signed   By: Constance Holster M.D.   On: 02/16/2021 16:04    Principal Problem:   GI bleeding Active Problems:   Hypertension   High cholesterol   Hypothyroidism   Symptomatic anemia   Class 3 obesity with current BMI at 44.30 kg/m   COPD (chronic obstructive pulmonary disease) (HCC)   Pancreatic mass   Aortic atherosclerosis ([place])    Tye Savoy, NP-C @  02/17/2021, 11:44 AM

## 2021-02-17 NOTE — TOC Progression Note (Signed)
Transition of Care [facility]) - Progression Note    Patient Details  Name: Wendy Hoffman MRN: [id number] Date of Birth: [birth year]  Transition of Care [facility]) CM/SW Contact  Joaquin Courts, RN Phone Number: 02/17/2021, 10:31 AM  Clinical Narrative:    [facility] consult in for medication assistance.  CM spoke with patient who reports she used to have insurance but does not at this time.  Patient does report she uses the pharmacy at [facility] and this has made her medications much more affordable though this pharmacy does not have pain medications or her Ambien.  CM also discussed the option to see a pcp at this clinic and apply for orange card program.  Patient declines for CM to make pcp appointment at this time, reports she is familiar with the orange card program.  Patient plans to continue to use [facility] pharmacy, no further TOC assistnace at this time.   Expected Discharge Plan: Home/Self Care Barriers to Discharge: No Barriers Identified  Expected Discharge Plan and Services Expected Discharge Plan: Home/Self Care   Discharge Planning Services: CM Consult   Living arrangements for the past 2 months: Single Family Home                                       Social Determinants of Health (SDOH) Interventions    Readmission Risk Interventions No flowsheet data found.

## 2021-02-17 NOTE — Progress Notes (Signed)
Initial Nutrition Assessment  DOCUMENTATION CODES:   Morbid obesity  INTERVENTION:  - continue Boost Breeze TID, each supplement provides 250 kcal and 9 grams of protein. - will order 30 ml Prosource Plus TID, each supplement provides 100 kcal and 15 grams protein.  - will order 1 tablet multivitamin with minerals/day. - complete NFPE when feasible.  NUTRITION DIAGNOSIS:   Inadequate protein intake related to other (see comment) (current diet order) as evidenced by other (comment) (CLD does not meet estimated protein need).  GOAL:   Patient will meet greater than or equal to 90% of their needs  MONITOR:   PO intake,Supplement acceptance,Diet advancement,Labs,Weight trends  REASON FOR ASSESSMENT:   Malnutrition Screening Tool  ASSESSMENT:   55 y.o. female with medical history of morbid obesity, HTN, HLD, cervical cancer/hysterectomy, iron deficiency anemia, anxiety, osteoarthritis, asthma/COPD, hypothyroidism, insomnia, and migraine headaches. She presented to the ED on 3/7 due to N/V, abdominal pain and bloating, and reported 38 lb weight loss in 2 months. In the ED, FOBT was positive and lipase was elevated. CT abdomen/pelvis showed multiple large liver masses consistent with metastatic disease and an ill-defined mass in the pancreatic head. MRI abdomen also done.  Patient not available for conversation at the time of attempted visit.  Diet advanced from NPO to CLD yesterday at [place] and then NPO resumed at midnight and re-advanced to CLD today at 1105. No intakes documented yet. Boost Breeze was ordered TID per ONS protocol yesterday.   She has not been seen by a [place] RD at any time in the past.   Notes indicates that patient reported abdominal pain and N/V began 3 months ago and that recently she has been experiencing constipation.   Per H&P information and MST report, patient reported 38-40 lb weight loss in the past 2-3 months.   Weight yesterday was 258 lb and  PTA the most recently documented weight was on 10/08/20 when she weighed 274 lb. This indicates 13 lb weight loss (4.7% body weight) in the past 4 months; not significant for time frame.   Per notes: - suspicion for pancreatic cancer with liver mets--possible plan for biopsy by IR - GIB with acute on chronic iron deficiency anemia--GI consulted   Labs reviewed. Medications reviewed; 40 mg IV protonix BID. IVF; NS @ 75 ml/hr.     NUTRITION - FOCUSED PHYSICAL EXAM:  unable to complete at this time.   Diet Order:   Diet Order            Diet NPO time specified Except for: Sips with Meds  Diet effective midnight           Diet clear liquid Room service appropriate? Yes; Fluid consistency: Thin  Diet effective now                 EDUCATION NEEDS:   Not appropriate for education at this time  Skin:  Skin Assessment: Reviewed RN Assessment  Last BM:  PTA/unknown  Height:   Ht Readings from Last 1 Encounters:  02/16/21 5\' 4"  (1.626 m)    Weight:   Wt Readings from Last 1 Encounters:  02/16/21 117.1 kg     Estimated Nutritional Needs:  Kcal:  2200-2400 kcal Protein:  110-120 grams Fluid:  >/= 2.3 L/day      Wendy Matin, MS, RD, LDN, CNSC Inpatient Clinical Dietitian RD pager # available in [facility]  After hours/weekend pager # available in [facility]

## 2021-02-17 NOTE — H&P (View-Only) (Signed)
Referring Provider:  [facility]         Primary Care Physician:  Patient, No Pcp Per Primary Gastroenterologist: unassigned       We were asked to see this patient for:    Iron deficiency anemia             Attending physician's note   I have taken a history, examined the patient and reviewed the chart. I agree with the Advanced Practitioner's note, impression and recommendations.  55 year old very pleasant female admitted with abdominal pain, weight loss and profound iron deficiency anemia with heme positive stool.  CT scan concerning for ?  Pancreatic lesion and multiple hepatic lesions Will plan for EGD and colonoscopy for further evaluation of heme positive stool and iron deficiency anemia, rule out GI malignancy. If EGD and colonoscopy unrevealing for any high risk lesion, may need to consider EUS for further evaluation of pancreatic lesion  The risks and benefits as well as alternatives of endoscopic procedure(s) have been discussed and reviewed. All questions answered. The patient agrees to proceed.  Monitor hemoglobin and transfuse to greater than 7 Bowel prep N.p.o. after 5 AM    The patient was provided an opportunity to ask questions and all were answered. The patient agreed with the plan and demonstrated an understanding of the instructions.  Damaris Hippo , MD [phone]      ASSESSMENT / PLAN:   # 56 yo female with upper abdominal pain, nausea, vomiting, weight loss and profound iron deficiency anemia and heme positive stool in setting of CT scan showing pancreatic head mass but also multiple liver lesions concerning for metastatic disease.  --Patient will be scheduled for EGD and colonoscopy for evaluation of iron deficiency anemia.  Patient will be scheduled for a colonoscopy. The risks and benefits of EGD and colonoscopy with possible polypectomy / biopsies were discussed and the patient agrees to proceed.  --Further evaluation of pancreatic lesion  following endoscopic evaluation.  --Hgb improved from 5.8 to 7.3 post 2 units of PRBC  # Possible right ventricle filling defect on non-contrast CT scan  # [facility] of lymphoma in brother and nephew  # Chronic GERD, has taken PPI for years.      HPI:                                                                                                                             Chief Complaint: pancreatic mass, liver lesions, anemia  Wendy Hoffman is a 55 y.o. female with history of asthma, morbid obesity, hyperlipidemia, cervical cancer,  migraines, hypothyroidism, HTN, cholecystectomy  Patient presented to [facility] ED yesterday with abdominal pain,  Vomiting and recent unitentional weight loss of 30 pounds. In ED Hgb was 5.8, she was FOBT +, MCV 69,  Ferritin 7,  lipase 266, liver tests normal. Non -contrast CT scan shows ill defined pancreatic head mass measuring 4.7 cm and multiple liver lesions c/w metastatic disease.  Patient says she began having mid upper abdominal pain radiating through to her back about 3 months ago. Initially symptoms improved after starting Dexilant and " a liquid" ( ? Carafate). Symptoms slowly returned then a month ago she started having frequent nausea and vomiting. She also starting having constipation which is unusual for her since she generally has BMs after every meal. She isn't aware of any overt GI bleeding but then doesn't really pay attention. No vaginal bleeding or dysuria and she doesn't donate blood. No [place] of colon cancer. Patient says she has never had a colonoscopy.    PREVIOUS ENDOSCOPIC EVALUATIONS / PERTINENT STUDIES   02/16/21 Non contrast CT scan IMPRESSION: 1. Multiple large hypoechoic masses in the patient's liver, consistent with metastatic disease. These are amenable to percutaneous biopsy as clinically indicated. 2. Ill-defined masslike area in the pancreatic head measuring approximately 4.7 cm. This is concerning for pancreatic adenocarcinoma.  Exam limited by lack of IV contrast. A follow-up contrast enhanced MRI would be useful for further evaluation and staging. 3. Possible filling defect within the right ventricle. Recommend further evaluation with a dedicated cardiac echo. 4. Anemia. 5. Punctate nonobstructing stone in the upper pole of the left kidney.   Past Medical History:  Diagnosis Date  . Anemia   . Anxiety   . Arthritis   . Asthma   . Cancer ([place])    cervic  . H/O: hysterectomy   . High cholesterol   . Hypertension   . Hypothyroidism   . Insomnia   . Medical history non-contributory   . Migraine   . Morbid obesity ([place])   . Stroke ([place])   . Thyroid disease     Past Surgical History:  Procedure Laterality Date  . ABDOMINAL HYSTERECTOMY    . CESAREAN SECTION     3 occasions  . CHOLECYSTECTOMY    . THYROIDECTOMY, PARTIAL     Goiter    Family History  Problem Relation Age of Onset  . Migraines Sister     Prior to Admission medications   Medication Sig Start Date End Date Taking? Authorizing Provider  albuterol (PROVENTIL) (2.5 MG/3ML) 0.083% nebulizer solution Take 3 mLs (2.5 mg total) by nebulization every 4 (four) hours as needed for wheezing or shortness of breath. 21/1/94  Yes Delora Fuel, MD  albuterol (VENTOLIN HFA) 108 (90 Base) MCG/ACT inhaler INHALE 1-2 PUFFS INTO THE LUNGS EVERY 6 (SIX) HOURS AS NEEDED FOR WHEEZING OR SHORTNESS OF BREATH. 03/07/20  Yes Newlin, Enobong, MD  amLODipine (NORVASC) 10 MG tablet Take 1 tablet (10 mg total) by mouth daily. 11/19/19  Yes Charlott Rakes, MD  baclofen (LIORESAL) 10 MG tablet Take 10 mg by mouth 2 (two) times daily.   Yes [provider]  DEXILANT 60 MG capsule Take 1 capsule by mouth daily. 09/19/20  Yes [provider]  gabapentin (NEURONTIN) 300 MG capsule Take 300 mg by mouth at bedtime. 01/07/21  Yes [provider]  ipratropium (ATROVENT) 0.02 % nebulizer solution Take 0.25 mg by nebulization daily as needed for  wheezing.   Yes [provider]  methocarbamol (ROBAXIN) 750 MG tablet Take 750 mg by mouth 2 (two) times daily.   Yes [provider]  oxyCODONE-acetaminophen (PERCOCET/ROXICET) 5-325 MG tablet Take 1 tablet by mouth every 4 (four) hours as needed for moderate pain. 10/03/20  Yes [provider]  rosuvastatin (CRESTOR) 5 MG tablet Take 5 mg by mouth daily.   Yes [provider]  sucralfate (CARAFATE) 1  GM/10ML suspension Take 10 mLs (1 g total) by mouth 4 (four) times daily -  with meals and at bedtime. 07/21/20  Yes Robinson, [place] N, PA-C  SUMAtriptan (IMITREX) 100 MG tablet Take 100 mg by mouth daily as needed for migraine.   Yes [provider]  SYMBICORT 160-4.5 MCG/ACT inhaler INHALE 2 PUFFS INTO THE LUNGS 2 (TWO) TIMES DAILY. Patient taking differently: Inhale 2 puffs into the lungs 2 (two) times daily. 03/07/20  Yes Newlin, Charlane Ferretti, MD  zolpidem (AMBIEN) 10 MG tablet Take 10 mg by mouth daily.   Yes [provider]  cyclobenzaprine (FLEXERIL) 10 MG tablet TAKE 1 TABLET (10 MG TOTAL) BY MOUTH 3 (THREE) TIMES DAILY AS NEEDED FOR MUSCLE SPASMS. Patient not taking: No sig reported 12/27/19   Charlott Rakes, MD  HYDROcodone-acetaminophen (NORCO/VICODIN) 5-325 MG tablet Take 1 tablet by mouth every 6 (six) hours as needed for severe pain. Patient not taking: No sig reported 07/21/20   Robinson, [place] N, PA-C  omeprazole (PRILOSEC) 20 MG capsule Take 1 capsule (20 mg total) by mouth daily. Patient not taking: No sig reported 07/21/20   Robinson, [place] N, PA-C  ondansetron (ZOFRAN ODT) 4 MG disintegrating tablet Take 1 tablet (4 mg total) by mouth every 8 (eight) hours as needed for nausea or vomiting. Patient not taking: No sig reported 07/21/20   Robinson, [place] N, PA-C  predniSONE (DELTASONE) 20 MG tablet Take 2 tablets (40 mg total) by mouth daily. Patient not taking: No sig reported 10/08/20   Little, Wenda Overland, MD  topiramate (TOPAMAX) 50  MG tablet Take 1 tablet (50 mg total) by mouth 2 (two) times daily. Patient not taking: No sig reported 11/19/19   Charlott Rakes, MD  fluticasone (FLONASE) 50 MCG/ACT nasal spray Place 1 spray into both nostrils daily. Patient not taking: Reported on 03/21/2019 12/29/17 06/29/19  Petrucelli, Glynda Jaeger, PA-C    Current Facility-Administered Medications  Medication Dose Route Frequency Provider Last Rate Last Admin  . lip balm (CARMEX) ointment           . 0.9 %  sodium chloride infusion (Manually program via Guardrails IV Fluids)   Intravenous Once Reubin Milan, MD      . 0.9 %  sodium chloride infusion   Intravenous Continuous Terrilee Croak, MD 75 mL/hr at 02/17/21 0920 New Bag at 02/17/21 0920  . albuterol (PROVENTIL) (2.5 MG/3ML) 0.083% nebulizer solution 2.5 mg  2.5 mg Nebulization Q4H PRN Reubin Milan, MD      . feeding supplement (BOOST / RESOURCE BREEZE) liquid 1 Container  1 Container Oral TID BM Georgette Shell, MD      . gabapentin (NEURONTIN) capsule 300 mg  300 mg Oral QHS Reubin Milan, MD      . HYDROmorphone (DILAUDID) injection 2 mg  2 mg Intravenous Q3H PRN Terrilee Croak, MD   2 mg at 02/17/21 0921  . iohexol (OMNIPAQUE) 300 MG/ML solution 100 mL  100 mL Intravenous Once PRN Charlesetta Shanks, MD      . mometasone-formoterol [facility]) 200-5 MCG/ACT inhaler 2 puff  2 puff Inhalation BID Reubin Milan, MD   2 puff at 02/17/21 0913  . ondansetron (ZOFRAN) tablet 4 mg  4 mg Oral Q6H PRN Reubin Milan, MD   4 mg at 02/17/21 0040   Or  . ondansetron [facility]) injection 4 mg  4 mg Intravenous Q6H PRN Reubin Milan, MD   4 mg at 02/17/21 0825  . pantoprazole (PROTONIX)  injection 40 mg  40 mg Intravenous Q12H Reubin Milan, MD   40 mg at 02/17/21 9211    Allergies as of 02/16/2021 - Review Complete 02/16/2021  Allergen Reaction Noted  . 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 Mango flavor Anaphylaxis 04/30/2013  .  Aspirin Rash   . Penicillins Hives     Family History  Problem Relation Age of Onset  . Migraines Sister     Social History   Socioeconomic History  . Marital status: Single    Spouse name: Not on file  . Number of children: Not on file  . Years of education: Not on file  . Highest education level: Not on file  Occupational History  . Not on file  Tobacco Use  . Smoking status: Former Smoker    Types: Cigarettes  . Smokeless tobacco: Never Used  Vaping Use  . Vaping Use: Never used  Substance and Sexual Activity  . Alcohol use: No  . Drug use: No  . Sexual activity: Not on file  Other Topics Concern  . Not on file  Social History Narrative  . Not on file   Social Determinants of Health   Financial Resource Strain: Not on file  Food Insecurity: Not on file  Transportation Needs: Not on file  Physical Activity: Not on file  Stress: Not on file  Social Connections: Not on file  Intimate Partner Violence: Not on file    Review of Systems: All systems reviewed and negative except where noted in HPI.  OBJECTIVE:    Physical Exam: Vital signs in last 24 hours: Temp:  [97.8 F (36.6 C)-98.6 F (37 C)] 97.8 F (36.6 C) (03/08 0800) Pulse Rate:  [69-109] 69 (03/08 0800) Resp:  [11-25] 17 (03/08 0800) BP: (106-143)/(57-93) 117/77 (03/08 0800) SpO2:  [77 %-100 %] 99 % (03/08 0913) Weight:  [117.1 kg-118.4 kg] 117.1 kg (03/07 1913)   General:   Alert obese female in NAD.  Psych:  Pleasant, cooperative. Normal mood and affect. Eyes:  Pupils equal, sclera clear, no icterus.   Conjunctiva pink. Ears:  Normal auditory acuity. Nose:  No deformity, discharge,  or lesions. Neck:  Supple; no masses Lungs:  Clear throughout to auscultation.   No wheezes, crackles, or rhonchi.  Heart:  Regular rate and rhythm;  no lower extremity edema Abdomen:  Soft, non-distended, nontender, BS active, no palp mass   Rectal:  Deferred  Msk:  Symmetrical without gross deformities.  . Neurologic:  Alert and  oriented x4;  grossly normal neurologically. Skin:  Intact without significant lesions or rashes.  Filed Weights   02/16/21 1214 02/16/21 1913  Weight: 118.4 kg 117.1 kg     Scheduled inpatient medications . lip balm      . sodium chloride   Intravenous Once  . feeding supplement  1 Container Oral TID BM  . gabapentin  300 mg Oral QHS  . mometasone-formoterol  2 puff Inhalation BID  . pantoprazole (PROTONIX) IV  40 mg Intravenous Q12H      Intake/Output from previous day: 03/07 0701 - 03/08 0700 In: 702 [Blood:702] Out: -  Intake/Output this shift: Total I/O In: -  Out: 300 [Urine:300]   Lab Results: Recent Labs    02/16/21 1224 02/16/21 2252 02/17/21 0845  WBC 12.9*  --  10.0  HGB 5.8* 5.0* 7.3*  HCT 19.8* 17.8* 24.4*  PLT 557*  --  365   BMET Recent Labs    02/16/21 1224 02/17/21 0845  NA 137 137  K 3.8 3.9  CL 102 104  CO2 23 26  GLUCOSE 116* 101*  BUN 14 12  CREATININE 0.77 0.79  CALCIUM 9.7 9.5   LFT Recent Labs    02/16/21 2252 02/17/21 0845  PROT 7.0 7.1  ALBUMIN 3.3* 3.5  AST 24 27  ALT 19 20  ALKPHOS 83 87  BILITOT 0.5 1.0  BILIDIR <0.1  --   IBILI NOT CALCULATED  --    PT/INR No results for input(s): LABPROT, INR in the last 72 hours. Hepatitis Panel No results for input(s): HEPBSAG, HCVAB, HEPAIGM, HEPBIGM in the last 72 hours.   . CBC Latest Ref Rng & Units 02/17/2021 02/16/2021 02/16/2021  WBC 4.0 - 10.5 K/uL 10.0 - 12.9(H)  Hemoglobin 12.0 - 15.0 g/dL 7.3(L) 5.0(LL) 5.8(LL)  Hematocrit 36.0 - 46.0 % 24.4(L) 17.8(L) 19.8(L)  Platelets 150 - 400 K/uL 365 - 557(H)    . CMP Latest Ref Rng & Units 02/17/2021 02/16/2021 02/16/2021  Glucose 70 - 99 mg/dL 101(H) - 116(H)  BUN 6 - 20 mg/dL 12 - 14  Creatinine 0.44 - 1.00 mg/dL 0.79 - 0.77  Sodium 135 - 145 mmol/L 137 - 137  Potassium 3.5 - 5.1 mmol/L 3.9 - 3.8  Chloride 98 - 111 mmol/L 104 - 102  CO2 22 - 32 mmol/L 26 - 23  Calcium 8.9 - 10.3 mg/dL 9.5 -  9.7  Total Protein 6.5 - 8.1 g/dL 7.1 7.0 7.6  Total Bilirubin 0.3 - 1.2 mg/dL 1.0 0.5 0.2(L)  Alkaline Phos 38 - 126 U/L 87 83 82  AST 15 - 41 U/L 27 24 29   ALT 0 - 44 U/L 20 19 20    Studies/Results: CT ABDOMEN PELVIS WO CONTRAST  Result Date: 02/16/2021 CLINICAL DATA:  Abdominal distension. EXAM: CT ABDOMEN AND PELVIS WITHOUT CONTRAST TECHNIQUE: Multidetector CT imaging of the abdomen and pelvis was performed following the standard protocol without IV contrast. COMPARISON:  None recent FINDINGS: Lower chest: The lung bases are clear.There is a possible filling defect within the right ventricle (axial series 2, image 4). The intracardiac blood pool is hypodense relative to the adjacent myocardium consistent with anemia. Hepatobiliary: Multiple large hypoechoic masses are noted in the patient's liver. The largest is located in the left hepatic lobe measures approximately 10 cm. The gallbladder is not well evaluated.There is no biliary ductal dilation. Pancreas: There is an ill-defined masslike area in the pancreatic head measuring approximately 4.7 cm (axial series 2, image 41). Spleen: Unremarkable. Adrenals/Urinary Tract: --Adrenal glands: Unremarkable. --Right kidney/ureter: No hydronephrosis or radiopaque kidney stones. --Left kidney/ureter: There is a punctate nonobstructing stone in the upper pole the left kidney. --Urinary bladder: Unremarkable. Stomach/Bowel: --Stomach/Duodenum: No hiatal hernia or other gastric abnormality. Normal duodenal course and caliber. --Small bowel: Unremarkable. --Colon: Unremarkable. --Appendix: Normal. Vascular/Lymphatic: Atherosclerotic calcification is present within the non-aneurysmal abdominal aorta, without hemodynamically significant stenosis. --No retroperitoneal lymphadenopathy. --No mesenteric lymphadenopathy. --No pelvic or inguinal lymphadenopathy. Reproductive: Status post hysterectomy. No adnexal mass. Other: No ascites or free air. The abdominal wall is  normal. Musculoskeletal. No acute displaced fractures. IMPRESSION: 1. Multiple large hypoechoic masses in the patient's liver, consistent with metastatic disease. These are amenable to percutaneous biopsy as clinically indicated. 2. Ill-defined masslike area in the pancreatic head measuring approximately 4.7 cm. This is concerning for pancreatic adenocarcinoma. Exam limited by lack of IV contrast. A follow-up contrast enhanced MRI would be useful for further evaluation and staging. 3. Possible filling defect within the right ventricle.  Recommend further evaluation with a dedicated cardiac echo. 4. Anemia. 5. Punctate nonobstructing stone in the upper pole of the left kidney. Aortic Atherosclerosis (ICD10-I70.0). Electronically Signed   By: Constance Holster M.D.   On: 02/16/2021 16:04    Principal Problem:   GI bleeding Active Problems:   Hypertension   High cholesterol   Hypothyroidism   Symptomatic anemia   Class 3 obesity with current BMI at 44.30 kg/m   COPD (chronic obstructive pulmonary disease) (HCC)   Pancreatic mass   Aortic atherosclerosis ([place])    Tye Savoy, NP-C @  02/17/2021, 11:44 AM

## 2021-02-17 NOTE — Progress Notes (Signed)
  Echocardiogram 2D Echocardiogram has been performed.  Bobbye Charleston 02/17/2021, 2:44 PM

## 2021-02-18 LAB — BASIC METABOLIC PANEL
Anion gap: 11 (ref 5–15)
BUN: 15 mg/dL (ref 6–20)
CO2: 24 mmol/L (ref 22–32)
Calcium: 9.4 mg/dL (ref 8.9–10.3)
Chloride: 103 mmol/L (ref 98–111)
Creatinine, Ser: 0.94 mg/dL (ref 0.44–1.00)
GFR, Estimated: >60 mL/min (ref >60)
Glucose, Bld: 99 mg/dL (ref 70–99)
Potassium: 3.8 mmol/L (ref 3.5–5.1)
Sodium: 138 mmol/L (ref 135–145)

## 2021-02-18 LAB — TYPE AND SCREEN
ABO/RH(D): A POS
Antibody Screen: NEGATIVE
Unit division: 0
Unit division: 0

## 2021-02-18 LAB — CBC WITH DIFFERENTIAL/PLATELET
Abs Immature Granulocytes: 0.11 10*3/uL — ABNORMAL HIGH (ref 0.00–0.07)
Basophils Absolute: 0 10*3/uL (ref 0.0–0.1)
Basophils Relative: 0 %
Eosinophils Absolute: 0.2 10*3/uL (ref 0.0–0.5)
Eosinophils Relative: 1 %
HCT: 25 % — ABNORMAL LOW (ref 36.0–46.0)
Hemoglobin: 7.4 g/dL — ABNORMAL LOW (ref 12.0–15.0)
Immature Granulocytes: 1 %
Lymphocytes Relative: 14 %
Lymphs Abs: 1.7 10*3/uL (ref 0.7–4.0)
MCH: 22.8 pg — ABNORMAL LOW (ref 26.0–34.0)
MCHC: 29.6 g/dL — ABNORMAL LOW (ref 30.0–36.0)
MCV: 77.2 fL — ABNORMAL LOW (ref 80.0–100.0)
Monocytes Absolute: 0.8 10*3/uL (ref 0.1–1.0)
Monocytes Relative: 6 %
Neutro Abs: 9.8 10*3/uL — ABNORMAL HIGH (ref 1.7–7.7)
Neutrophils Relative %: 78 %
Platelets: 364 10*3/uL (ref 150–400)
RBC: 3.24 MIL/uL — ABNORMAL LOW (ref 3.87–5.11)
RDW: 22 % — ABNORMAL HIGH (ref 11.5–15.5)
WBC: 12.7 10*3/uL — ABNORMAL HIGH (ref 4.0–10.5)
nRBC: 0.2 % (ref 0.0–0.2)

## 2021-02-18 LAB — BPAM RBC
Blood Product Expiration Date: 202204012359
Blood Product Expiration Date: 202204012359
ISSUE DATE / TIME: 202203072336
ISSUE DATE / TIME: 202203080313
Unit Type and Rh: 6200
Unit Type and Rh: 6200

## 2021-02-18 MED ORDER — DIPHENHYDRAMINE HCL 50 MG/ML IJ SOLN
25.0000 mg | Freq: Once | INTRAMUSCULAR | Status: AC
  Administered 2021-02-18: 25 mg via INTRAVENOUS
  Filled 2021-02-18: qty 1

## 2021-02-18 MED ORDER — POLYETHYLENE GLYCOL 3350 17 GM/SCOOP PO POWD
1.0000 | Freq: Once | ORAL | Status: AC
  Administered 2021-02-18: 255 g via ORAL
  Filled 2021-02-18: qty 255

## 2021-02-18 MED ORDER — FENTANYL CITRATE (PF) 100 MCG/2ML IJ SOLN
25.0000 ug | Freq: Once | INTRAMUSCULAR | Status: AC
  Administered 2021-02-18: 25 ug via INTRAVENOUS
  Filled 2021-02-18: qty 2

## 2021-02-18 MED ORDER — POLYETHYLENE GLYCOL 3350 17 GM/SCOOP PO POWD
1.0000 | Freq: Once | ORAL | Status: AC
  Administered 2021-02-19: 255 g via ORAL
  Filled 2021-02-18: qty 255

## 2021-02-18 NOTE — Progress Notes (Addendum)
PROGRESS NOTE  Wendy Hoffman  DOB: 01/21/1966  PCP: Patient, No Pcp Per ONG:[id number]  DOA: 02/16/2021  LOS: 2 days   Chief Complaint  Patient presents with  . Abdominal Pain  . Emesis   Brief narrative: Wendy Hoffman is a 55 y.o. female with PMH significant for morbid obesity, HTN, HLD, cervical cancer/hysterectomy, chronic iron deficiency anemia, anxiety, osteoarthritis, asthma/COPD, hypothyroidism, insomnia, migraine headaches.  Patient presented to the ED on 3/7 with nausea, vomiting, abdominal pain, bloating and 38 pound weight loss in 2 months. In the ED, fecal occult blood test was positive. CBC with a WBC count of 12.9, hemoglobin low at 5.8, it was 12.87 months ago Lipase elevated to 266 CT abdomen/pelvis without contrast showed multiple large hypoechoic masses in the patient's liver consistent with metastatic disease. There is an ill-defined masslike area in the pancreatic head measuring about 4.7 cm. MRI of the abdomen was suggested for further evaluation and staging.  There is a possible filling defect within the right ventricle.  Echocardiogram suggested.   Patient was admitted to hospitalist service  Subjective: Patient was seen and examined this morning.   Sitting up in bed.  Not in distress.  Pain controlled better today.   Could not tolerate colonic prep because of persistent nausea and vomiting. Noted a plan from GI for EGD and colonoscopy tomorrow  Assessment/Plan: Suspicious pancreatic cancer with liver metastasis -Presented with abdominal pain, weight loss. -CT scan finding as above with pancreatic head mass of 4.7 cm with multiple liver mets -MRI abdomen was suggested for further evaluation and staging.  Ordered for today. -Possibly biopsy by IR if unable to biopsy at endoscope.  Iron deficiency anemia due to acute on chronic GI bleeding  -Hemoglobin low at 5 on admission. -2 units of PRBC transfused so far.  Repeat hemoglobin this morning is at 7.4.    -Repeat blood work tomorrow.  Transfuse to keep hemoglobin more than 7. -GI consultation appreciated Recent Labs    07/21/20 1813 02/16/21 1224 02/16/21 2252 02/17/21 0845 02/18/21 0933  HGB 12.8 5.8* 5.0* 7.3* 7.4*  MCV 94.0 69.0*  --  76.5* 77.2*  VITAMINB12  --   --  330  --   --   FOLATE  --   --  12.0  --   --   FERRITIN  --   --  7*  --   --   TIBC  --   --  400  --   --   IRON  --   --  16*  --   --   RETICCTPCT  --   --  1.7  --   --    Filling defect on the right ventricle -Per CT abdomen -Echocardiogram was obtained.  I discussed with cardiologist Dr. Acie Fredrickson and Dr. Reather Littler.  Recommended cardiac MRI.  Hypertension -Amlodipine on hold.  Blood pressure stable.  Continue to monitor  High cholesterol/Aortic atherosclerosis -Continue rosuvastatin.  Hypothyroidism -Continue Synthroid  Morbid obesity  -Body mass index is 44.3 kg/m. Patient has been advised to make an attempt to improve diet and exercise patterns to aid in weight loss.  COPD -Supplemental oxygen as needed. -Bronchodilators as needed.  Mobility: Encourage ambulation Code Status:   Code Status: Full Code  Nutritional status: Body mass index is 44.3 kg/m. Nutrition Problem: Inadequate protein intake Etiology: other (see comment) (current diet order) Signs/Symptoms: other (comment) (CLD does not meet estimated protein need) Diet Order  Diet NPO time specified  Diet effective 1000           Diet clear liquid Room service appropriate? Yes; Fluid consistency: Thin  Diet effective now                 DVT prophylaxis: SCDs Start: 02/16/21 1723   Antimicrobials:  None Fluid: Normal saline at 75 mill per hour Consultants: GI Family Communication:  None at bedside  Status is: Inpatient  Remains inpatient appropriate because:Pending EGD  Dispo: The patient is from: Home              Anticipated d/c is to: Home in 2 to 3 days              Patient currently is not medically  stable to d/c.   Difficult to place patient No       Infusions:  . sodium chloride 75 mL/hr at 02/18/21 1259    Scheduled Meds: . (feeding supplement) PROSource Plus  30 mL Oral TID BM  . sodium chloride   Intravenous Once  . feeding supplement  1 Container Oral TID BM  . gabapentin  300 mg Oral QHS  . metoCLOPramide (REGLAN) injection  5 mg Intravenous Q6H  . mometasone-formoterol  2 puff Inhalation BID  . multivitamin with minerals  1 tablet Oral Daily  . pantoprazole (PROTONIX) IV  40 mg Intravenous Q12H  . [START ON 02/19/2021] polyethylene glycol powder  1 Container Oral Once    Antimicrobials: Anti-infectives (From admission, onward)   None      PRN meds: albuterol, hydrocortisone cream, HYDROmorphone (DILAUDID) injection, iohexol, ondansetron **OR** ondansetron (ZOFRAN) IV   Objective: Vitals:   02/18/21 0632 02/18/21 1220  BP: 125/89 122/71  Pulse: 77 61  Resp: 20 18  Temp: 97.9 F (36.6 C) 98.9 F (37.2 C)  SpO2: 95%     Intake/Output Summary (Last 24 hours) at 02/18/2021 1648 Last data filed at 02/18/2021 0333 Gross per 24 hour  Intake 1592.66 ml  Output 785 ml  Net 807.66 ml   Filed Weights   02/16/21 1214 02/16/21 1913  Weight: 118.4 kg 117.1 kg   Weight change:  Body mass index is 44.3 kg/m.   Physical Exam: General exam: Pleasant, middle-aged African-American female.  Not in distress at this time Skin: No rashes, lesions or ulcers. HEENT: Atraumatic, normocephalic, no obvious bleeding Lungs: Clear to auscultation bilaterally CVS: Regular rate and rhythm, no murmur GI/Abd soft, epigastric tenderness present, nondistended, bowel sound present CNS: Alert, awake, oriented x3. Psychiatry: Mood appropriate Extremities: No pedal edema, no calf tenderness  Data Review: I have personally reviewed the laboratory data and studies available.  Recent Labs  Lab 02/16/21 1224 02/16/21 2252 02/17/21 0845 02/18/21 0933  WBC 12.9*  --  10.0 12.7*   NEUTROABS  --   --   --  9.8*  HGB 5.8* 5.0* 7.3* 7.4*  HCT 19.8* 17.8* 24.4* 25.0*  MCV 69.0*  --  76.5* 77.2*  PLT 557*  --  365 364   Recent Labs  Lab 02/16/21 1224 02/16/21 2252 02/17/21 0845 02/18/21 0933  NA 137  --  137 138  K 3.8  --  3.9 3.8  CL 102  --  104 103  CO2 23  --  26 24  GLUCOSE 116*  --  101* 99  BUN 14  --  12 15  CREATININE 0.77  --  0.79 0.94  CALCIUM 9.7  --  9.5 9.4  MG  --  2.1  --   --   PHOS  --  3.7  --   --     F/u labs ordered Unresulted Labs (From admission, onward)          Start     Ordered   02/19/21 2229  Basic metabolic panel  Daily,   R      02/18/21 0756   02/19/21 0500  CBC with Differential/Platelet  Daily,   R      02/18/21 0756   02/16/21 1723  HIV Antibody (routine testing w rflx)  (HIV Antibody (Routine testing w reflex) panel)  Once,   R        02/16/21 1724          Signed, Terrilee Croak, MD [facility] 02/18/2021

## 2021-02-18 NOTE — Progress Notes (Signed)
After starting moviprep, pt c/o itching. No SOB or hives. MD notified. Hydrocortisone cream applied to body and 25mg  of Benadryl given. Will continue to monitor.

## 2021-02-19 ENCOUNTER — Inpatient Hospital Stay (HOSPITAL_COMMUNITY): Payer: Medicaid Other | Admitting: Anesthesiology

## 2021-02-19 ENCOUNTER — Encounter (HOSPITAL_COMMUNITY): Payer: Self-pay | Admitting: Internal Medicine

## 2021-02-19 ENCOUNTER — Encounter (HOSPITAL_COMMUNITY)
Admission: EM | Disposition: A | Discharge status: Home / Self Care | Payer: Self-pay | Source: Home / Self Care | Attending: Internal Medicine

## 2021-02-19 DIAGNOSIS — K3189 Other diseases of stomach and duodenum: Secondary | ICD-10-CM

## 2021-02-19 DIAGNOSIS — R1013 Epigastric pain: Secondary | ICD-10-CM

## 2021-02-19 DIAGNOSIS — K2100 Gastro-esophageal reflux disease with esophagitis, without bleeding: Secondary | ICD-10-CM

## 2021-02-19 DIAGNOSIS — R195 Other fecal abnormalities: Secondary | ICD-10-CM

## 2021-02-19 DIAGNOSIS — D500 Iron deficiency anemia secondary to blood loss (chronic): Secondary | ICD-10-CM

## 2021-02-19 HISTORY — PX: FLEXIBLE SIGMOIDOSCOPY: SHX5431

## 2021-02-19 HISTORY — PX: BIOPSY: SHX5522

## 2021-02-19 HISTORY — PX: ESOPHAGOGASTRODUODENOSCOPY (EGD) WITH PROPOFOL: SHX5813

## 2021-02-19 LAB — BASIC METABOLIC PANEL
Anion gap: 12 (ref 5–15)
BUN: 13 mg/dL (ref 6–20)
CO2: 23 mmol/L (ref 22–32)
Calcium: 9.4 mg/dL (ref 8.9–10.3)
Chloride: 103 mmol/L (ref 98–111)
Creatinine, Ser: 0.87 mg/dL (ref 0.44–1.00)
GFR, Estimated: >60 mL/min (ref >60)
Glucose, Bld: 110 mg/dL — ABNORMAL HIGH (ref 70–99)
Potassium: 3.4 mmol/L — ABNORMAL LOW (ref 3.5–5.1)
Sodium: 138 mmol/L (ref 135–145)

## 2021-02-19 LAB — CBC WITH DIFFERENTIAL/PLATELET
Abs Immature Granulocytes: 0.1 10*3/uL — ABNORMAL HIGH (ref 0.00–0.07)
Basophils Absolute: 0.1 10*3/uL (ref 0.0–0.1)
Basophils Relative: 1 %
Eosinophils Absolute: 0.3 10*3/uL (ref 0.0–0.5)
Eosinophils Relative: 3 %
HCT: 27 % — ABNORMAL LOW (ref 36.0–46.0)
Hemoglobin: 7.8 g/dL — ABNORMAL LOW (ref 12.0–15.0)
Immature Granulocytes: 1 %
Lymphocytes Relative: 16 %
Lymphs Abs: 1.8 10*3/uL (ref 0.7–4.0)
MCH: 22.7 pg — ABNORMAL LOW (ref 26.0–34.0)
MCHC: 28.9 g/dL — ABNORMAL LOW (ref 30.0–36.0)
MCV: 78.7 fL — ABNORMAL LOW (ref 80.0–100.0)
Monocytes Absolute: 0.7 10*3/uL (ref 0.1–1.0)
Monocytes Relative: 7 %
Neutro Abs: 8 10*3/uL — ABNORMAL HIGH (ref 1.7–7.7)
Neutrophils Relative %: 72 %
Platelets: 356 10*3/uL (ref 150–400)
RBC: 3.43 MIL/uL — ABNORMAL LOW (ref 3.87–5.11)
RDW: 23.3 % — ABNORMAL HIGH (ref 11.5–15.5)
WBC: 11.1 10*3/uL — ABNORMAL HIGH (ref 4.0–10.5)
nRBC: 0.3 % — ABNORMAL HIGH (ref 0.0–0.2)

## 2021-02-19 LAB — GLUCOSE, CAPILLARY: Glucose-Capillary: 111 mg/dL — ABNORMAL HIGH (ref 70–99)

## 2021-02-19 SURGERY — SIGMOIDOSCOPY, FLEXIBLE
Anesthesia: Monitor Anesthesia Care

## 2021-02-19 MED ORDER — FLEET ENEMA 7-19 GM/118ML RE ENEM
1.0000 | ENEMA | RECTAL | Status: AC
  Administered 2021-02-19: 1 via RECTAL
  Filled 2021-02-19: qty 1

## 2021-02-19 MED ORDER — LACTATED RINGERS IV SOLN: INTRAVENOUS | Status: DC | PRN

## 2021-02-19 MED ORDER — PROPOFOL 500 MG/50ML IV EMUL
INTRAVENOUS | Status: DC | PRN
  Administered 2021-02-19: 30 mg via INTRAVENOUS
  Administered 2021-02-19: 150 ug/kg/min via INTRAVENOUS

## 2021-02-19 MED ORDER — LIDOCAINE 2% (20 MG/ML) 5 ML SYRINGE
INTRAMUSCULAR | Status: DC | PRN
  Administered 2021-02-19: 40 mg via INTRAVENOUS

## 2021-02-19 SURGICAL SUPPLY — 15 items

## 2021-02-19 NOTE — Op Note (Signed)
[facility] Patient Name: Wendy Hoffman Procedure Date: 02/19/2021 MRN: [id number] Attending MD: Mauri Pole , MD Date of Birth: February 05, 1966 CSN: [id number] Age: 55 Admit Type: Inpatient Procedure:                Upper GI endoscopy Indications:              Recent gastrointestinal bleeding, Gastrointestinal                            bleeding of unknown origin, Suspected upper                            gastrointestinal bleeding in patient with                            unexplained iron deficiency anemia, Generalized                            abdominal pain, Abnormal CT of the GI tract Providers:                Mauri Pole, MD, Baird Cancer, RN, Lesia Sago, Technician Referring MD:              Medicines:                Monitored Anesthesia Care Complications:            No immediate complications. Estimated Blood Loss:     Estimated blood loss was minimal. Procedure:                Pre-Anesthesia Assessment:                           - Prior to the procedure, a History and Physical                            was performed, and patient medications and                            allergies were reviewed. The patient's tolerance of                            previous anesthesia was also reviewed. The risks                            and benefits of the procedure and the sedation                            options and risks were discussed with the patient.                            All questions were answered, and informed consent                            was  obtained. Prior Anticoagulants: The patient has                            taken no previous anticoagulant or antiplatelet                            agents. ASA Grade Assessment: III - A patient with                            severe systemic disease. After reviewing the risks                            and benefits, the patient was deemed in                             satisfactory condition to undergo the procedure.                           After obtaining informed consent, the endoscope was                            passed under direct vision. Throughout the                            procedure, the patient's blood pressure, pulse, and                            oxygen saturations were monitored continuously. The                            GIF-H190 ([id number]) Olympus gastroscope was                            introduced through the mouth, and advanced to the                            second part of duodenum. The upper GI endoscopy was                            accomplished without difficulty. The patient                            tolerated the procedure well. Scope In: Scope Out: Findings:      LA Grade C (one or more mucosal breaks continuous between tops of 2 or       more mucosal folds, less than 75% circumference) esophagitis with no       bleeding was found 31 to 36 cm from the incisors.      A small amount of food (residue) was found in the entire examined       stomach.      Excessive bilious fluid was found in the entire examined stomach.      The cardia and gastric fundus were normal on retroflexion.      A large infiltrative mass with bleeding was found in the  second portion       of the duodenum, appears to arising near ampulla, is partially       obstructive with friable mucosa. Biopsies were taken with a cold forceps       for histology. Impression:               - LA Grade C reflux esophagitis with no bleeding.                           - A small amount of food (residue) in the stomach.                           - Excessive gastric fluid.                           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 Biopsied. Moderate Sedation:      Not Applicable - Patient had care per Anesthesia. Recommendation:           - clear liquid diet. Adavnce as tolerated to soft                            low residue diet.                            - Continue present medications.                           - Await pathology results.                           - No ibuprofen, naproxen, or other non-steroidal                            anti-inflammatory drugs.                           - F/u MRI abdomen/MRCP                           - Monitor Hgb and transfuse to >7                           - Please consult Oncology Procedure Code(s):        --- Professional ---                           [id number], Esophagogastroduodenoscopy, flexible,                            transoral; with biopsy, single or multiple Diagnosis Code(s):        --- Professional ---                           K21.00, Gastro-esophageal reflux disease with                            esophagitis, without bleeding  K31.89, Other diseases of stomach and duodenum                           K92.2, Gastrointestinal hemorrhage, unspecified                           D50.9, Iron deficiency anemia, unspecified                           R10.84, Generalized abdominal pain                           R93.3, Abnormal findings on diagnostic imaging of                            other parts of digestive tract CPT copyright 2019 [employer]. All rights reserved. The codes documented in this report are preliminary and upon coder review may  be revised to meet current compliance requirements. Mauri Pole, MD 02/19/2021 1:21:46 PM This report has been signed electronically. Number of Addenda: 0

## 2021-02-19 NOTE — Anesthesia Preprocedure Evaluation (Signed)
Anesthesia Evaluation  Patient identified by MRN, date of birth, ID band Patient awake    Reviewed: Allergy & Precautions, NPO status , Patient's Chart, lab work & pertinent test results  Airway Mallampati: III  TM Distance: <3 FB Neck ROM: Full    Dental no notable dental hx.    Pulmonary asthma , former smoker,  Probable OSA   Pulmonary exam normal breath sounds clear to auscultation + decreased breath sounds      Cardiovascular hypertension, Pt. on medications + DOE  Normal cardiovascular exam Rhythm:Regular Rate:Normal     Neuro/Psych CVA negative psych ROS   GI/Hepatic negative GI ROS, Neg liver ROS,   Endo/Other  Hypothyroidism Morbid obesity  Renal/GU negative Renal ROS  negative genitourinary   Musculoskeletal negative musculoskeletal ROS (+)   Abdominal   Peds negative pediatric ROS (+)  Hematology negative hematology ROS (+) anemia ,   Anesthesia Other Findings   Reproductive/Obstetrics negative OB ROS                             Anesthesia Physical Anesthesia Plan  ASA: III  Anesthesia Plan: MAC   Post-op Pain Management:    Induction: Intravenous  PONV Risk Score and Plan: 2 and Propofol infusion and Treatment may vary due to age or medical condition  Airway Management Planned: Simple Face Mask  Additional Equipment:   Intra-op Plan:   Post-operative Plan:   Informed Consent: I have reviewed the patients History and Physical, chart, labs and discussed the procedure including the risks, benefits and alternatives for the proposed anesthesia with the patient or authorized representative who has indicated his/her understanding and acceptance.     Dental advisory given  Plan Discussed with: CRNA and Surgeon  Anesthesia Plan Comments:         Anesthesia Quick Evaluation

## 2021-02-19 NOTE — Progress Notes (Signed)
PROGRESS NOTE  Wendy Hoffman  DOB: 01-28-1966  PCP: Patient, No Pcp Per TDV:[id number]  DOA: 02/16/2021  LOS: 3 days   Chief Complaint  Patient presents with  . Abdominal Pain  . Emesis   Brief narrative: Wendy Hoffman is a 55 y.o. female with PMH significant for morbid obesity, HTN, HLD, cervical cancer/hysterectomy, chronic iron deficiency anemia, anxiety, osteoarthritis, asthma/COPD, hypothyroidism, insomnia, migraine headaches.  Patient presented to the ED on 3/7 with nausea, vomiting, abdominal pain, bloating and 38 pound weight loss in 2 months. In the ED, fecal occult blood test was positive. CBC with a WBC count of 12.9, hemoglobin low at 5.8, it was 12.87 months ago Lipase elevated to 266 CT abdomen/pelvis without contrast showed multiple large hypoechoic masses in the patient's liver consistent with metastatic disease. There is an ill-defined masslike area in the pancreatic head measuring about 4.7 cm. MRI of the abdomen was suggested for further evaluation and staging.  There is a possible filling defect within the right ventricle.  Echocardiogram suggested.   Patient was admitted to hospitalist service  Subjective: Patient was seen and examined this morning.   Sitting up in bed.  Says 'I I feel miserable.' Unable to tolerate full bowel prep. GI has a tentative plan of EGD and colonoscopy today.  Assessment/Plan: Suspicious pancreatic cancer with liver metastasis -Presented with abdominal pain, weight loss. -CT scan finding as above with pancreatic head mass of 4.7 cm with multiple liver mets -MRI abdomen was suggested for further evaluation and staging.  Pending MRI -Possibly biopsy by IR if unable to biopsy at endoscope.  Iron deficiency anemia due to acute on chronic GI bleeding  -Hemoglobin low at 5 on admission. -2 units of PRBC transfused so far.  Hemoglobin has remained stable between 7-8 for last 3 days. -Repeat blood work tomorrow.  Transfuse to keep  hemoglobin more than 7. -GI consultation appreciated.  Tentative plan of EGD and colonoscopy today. Recent Labs    02/16/21 1224 02/16/21 2252 02/17/21 0845 02/18/21 0933 02/19/21 0431  HGB 5.8* 5.0* 7.3* 7.4* 7.8*  MCV 69.0*  --  76.5* 77.2* 78.7*  VITAMINB12  --  330  --   --   --   FOLATE  --  12.0  --   --   --   FERRITIN  --  7*  --   --   --   TIBC  --  400  --   --   --   IRON  --  16*  --   --   --   RETICCTPCT  --  1.7  --   --   --    Filling defect on the right ventricle -Per CT abdomen -Echocardiogram was obtained.  I discussed with cardiologist Dr. Acie Fredrickson and Dr. Reather Littler.  -Recommended cardiac MRI. Pending.  Hypertension -Amlodipine on hold.  Blood pressure stable.  Continue to monitor  High cholesterol/Aortic atherosclerosis -Continue rosuvastatin.  Hypothyroidism -Continue Synthroid  Morbid obesity  -Body mass index is 44.3 kg/m. Patient has been advised to make an attempt to improve diet and exercise patterns to aid in weight loss.  COPD -Supplemental oxygen as needed. -Bronchodilators as needed.  Mobility: Encourage ambulation Code Status:   Code Status: Full Code  Nutritional status: Body mass index is 44.3 kg/m. Nutrition Problem: Inadequate protein intake Etiology: other (see comment) (current diet order) Signs/Symptoms: other (comment) (CLD does not meet estimated protein need) Diet Order  Diet NPO time specified  Diet effective 1000                 DVT prophylaxis: SCDs Start: 02/16/21 1723   Antimicrobials:  None Fluid: Normal saline at 75 mill per hour Consultants: GI Family Communication:  None at bedside  Status is: Inpatient  Remains inpatient appropriate because: Pending EGD and colonoscopy  Dispo: The patient is from: Home              Anticipated d/c is to: Home in 2 to 3 days              Patient currently is not medically stable to d/c.   Difficult to place patient No       Infusions:  . sodium  chloride 75 mL/hr at 02/18/21 1259    Scheduled Meds: . (feeding supplement) PROSource Plus  30 mL Oral TID BM  . sodium chloride   Intravenous Once  . feeding supplement  1 Container Oral TID BM  . gabapentin  300 mg Oral QHS  . metoCLOPramide (REGLAN) injection  5 mg Intravenous Q6H  . mometasone-formoterol  2 puff Inhalation BID  . multivitamin with minerals  1 tablet Oral Daily  . pantoprazole (PROTONIX) IV  40 mg Intravenous Q12H  . sodium phosphate  1 enema Rectal STAT    Antimicrobials: Anti-infectives (From admission, onward)   None      PRN meds: albuterol, hydrocortisone cream, HYDROmorphone (DILAUDID) injection, iohexol, ondansetron **OR** ondansetron (ZOFRAN) IV   Objective: Vitals:   02/19/21 0848 02/19/21 1013  BP:  118/79  Pulse:  89  Resp:  16  Temp:  98 F (36.7 C)  SpO2: 95% 98%    Intake/Output Summary (Last 24 hours) at 02/19/2021 1020 Last data filed at 02/19/2021 0335 Gross per 24 hour  Intake --  Output 100 ml  Net -100 ml   Filed Weights   02/16/21 1214 02/16/21 1913  Weight: 118.4 kg 117.1 kg   Weight change:  Body mass index is 44.3 kg/m.   Physical Exam: General exam: Pleasant, middle-aged African-American female.  Intermittent partially controlled pain Skin: No rashes, lesions or ulcers. HEENT: Atraumatic, normocephalic, no obvious bleeding Lungs: Clear to auscultation bilaterally CVS: Regular rate and rhythm, no murmur GI/Abd soft, epigastric tenderness present, nondistended, bowel sound present CNS: Alert, awake, oriented x3. Psychiatry: Depressed look Extremities: No pedal edema, no calf tenderness  Data Review: I have personally reviewed the laboratory data and studies available.  Recent Labs  Lab 02/16/21 1224 02/16/21 2252 02/17/21 0845 02/18/21 0933 02/19/21 0431  WBC 12.9*  --  10.0 12.7* 11.1*  NEUTROABS  --   --   --  9.8* 8.0*  HGB 5.8* 5.0* 7.3* 7.4* 7.8*  HCT 19.8* 17.8* 24.4* 25.0* 27.0*  MCV 69.0*  --   76.5* 77.2* 78.7*  PLT 557*  --  365 364 356   Recent Labs  Lab 02/16/21 1224 02/16/21 2252 02/17/21 0845 02/18/21 0933 02/19/21 0431  NA 137  --  137 138 138  K 3.8  --  3.9 3.8 3.4*  CL 102  --  104 103 103  CO2 23  --  26 24 23   GLUCOSE 116*  --  101* 99 110*  BUN 14  --  12 15 13   CREATININE 0.77  --  0.79 0.94 0.87  CALCIUM 9.7  --  9.5 9.4 9.4  MG  --  2.1  --   --   --  PHOS  --  3.7  --   --   --     F/u labs ordered Unresulted Labs (From admission, onward)          Start     Ordered   02/19/21 2426  Basic metabolic panel  Daily,   R      02/18/21 0756   02/19/21 0500  CBC with Differential/Platelet  Daily,   R      02/18/21 0756          Signed, Terrilee Croak, MD [facility] 02/19/2021

## 2021-02-19 NOTE — Progress Notes (Signed)
Pt pending a cardiac MRI for possible RV thrombus/mass.   She is tentatively scheduled for 02/20/21 at 12:00 noon. Pt does not have any special equipment, does not require oxygen, is claustrophobic (Dahal will prescribe a one time dose Ativan for MR).   [facility] has been scheduled for pick up at [facility] room 1423 at 11:30a.   Marchia Bond RN Navigator Cardiac Imaging [facility] [phone] Office  [phone] Cell

## 2021-02-19 NOTE — Progress Notes (Signed)
Pt still working on bowel prep that was given at 1634. Pt states if she drinks it to fast she will throw up. Pt states she has thrown a couple of times from drinking too quickly. Pt states she doesn't feel nauseous, she will just throw up. Will continue to monitor.

## 2021-02-19 NOTE — Consult Note (Incomplete)
[facility]  [phone] [phone]   MEDICAL ONCOLOGY - INITIAL CONSULTATION  Referral MD: Dr. Terrilee Croak  Reason for Referral: Duodenal mass, liver lesions  HPI: Wendy Hoffman is a 55 year old female with a past medical history significant for iron deficiency anemia, anxiety, osteoarthritis, asthma/COPD, history of cervical cancer status post hysterectomy, hyperlipidemia, hypertension, hypothyroidism, insomnia, migraine headaches, obesity, history of nonhemorrhagic stroke.  She presented to the emergency room due to abdominal pain and bloating.  Was also having frequent nausea and emesis.  Labs performed in the emergency room showed WBC of 12.9, hemoglobin 5.8, MCV 69, platelets 557,000.  Stool for occult blood was positive.  Her ferritin was 7, iron 16, TIBC 400, percent saturation 4%.  She has received 2 units PRBC so far this admission.  A CT of the abdomen/pelvis showed multiple large hypoechoic masses in the patient's liver consistent with metastatic disease, ill-defined masslike area of the pancreatic head measuring approximately 4.7 cm concerning for pancreatic adenocarcinoma, possible filling defect within the right ventricle and echocardiogram recommended for follow-up, anemia, punctate nonobstructing stone in the upper pole of the left kidney.  The patient has been seen by GI and underwent EGD and colonoscopy 02/17/2021.  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  Colonoscopy showed diverticulosis in the sigmoid colon, nonbleeding external and internal hemorrhoids, otherwise normal.  Wendy Hoffman was seen in her hospital room today.  She states that she has had a poor appetite and has not really been able to eat very well for about a month.  She has lost 46 pounds in 1 month.  She also reports ongoing epigastric pain as well as nausea and vomiting.  She has been having some dyspnea with exertion as well as  lightheadedness recently.  She has not had any recent fevers, chills, chest pain, shortness of breath at rest, bleeding.  She is receiving another unit of PRBCs today for anemia.  She is scheduled to go to [facility] today for a cardiac MRI.  The patient is divorced.  She has 3 biologic children and one stepdaughter.  Denies history of alcohol use.  She quit smoking cigarettes several months ago.  She states that she smoked 1 to 2 cigarettes/day.  Family history significant for a sister and a nephew with non-Hodgkin's lymphoma.  Medical oncology was asked see the patient to make recommendations regarding her duodenal mass and liver lesions.   Past Medical History:  Diagnosis Date  . Anemia   . Anxiety   . Arthritis   . Asthma   . Cancer ([place])    cervic  . H/O: hysterectomy   . High cholesterol   . Hypertension   . Hypothyroidism   . Insomnia   . Medical history non-contributory   . Migraine   . Morbid obesity ([place])   . Shortness of breath   . Stroke ([place])   . Thyroid disease   :  .  G5, P3, 1 miscarriage, 1 abortion  Past Surgical History:  Procedure Laterality Date  . ABDOMINAL HYSTERECTOMY    . CESAREAN SECTION     3 occasions  . CHOLECYSTECTOMY    . THYROIDECTOMY, PARTIAL     Goiter  :  Current Facility-Administered Medications  Medication Dose Route Frequency Provider Last Rate Last Admin  . (feeding supplement) PROSource Plus liquid 30 mL  30 mL Oral TID BM Dahal, Binaya, MD   30 mL at 02/18/21 1636  .  0.9 %  sodium chloride infusion (Manually program via Guardrails IV Fluids)   Intravenous Once Reubin Milan, MD      . 0.9 %  sodium chloride infusion   Intravenous Continuous Terrilee Croak, MD 75 mL/hr at 02/18/21 1259 New Bag at 02/18/21 1259  . albuterol (PROVENTIL) (2.5 MG/3ML) 0.083% nebulizer solution 2.5 mg  2.5 mg Nebulization Q4H PRN Reubin Milan, MD      . feeding supplement (BOOST / RESOURCE BREEZE) liquid 1 Container  1 Container Oral TID BM  Georgette Shell, MD   1 Container at 02/19/21 1440  . gabapentin (NEURONTIN) capsule 300 mg  300 mg Oral QHS Reubin Milan, MD   300 mg at 02/18/21 2233  . hydrocortisone cream 1 %   Topical TID PRN Terrilee Croak, MD   Given at 02/18/21 0308  . HYDROmorphone (DILAUDID) injection 2 mg  2 mg Intravenous Q2H PRN Terrilee Croak, MD   2 mg at 02/19/21 1439  . iohexol (OMNIPAQUE) 300 MG/ML solution 100 mL  100 mL Intravenous Once PRN Pfeiffer, Jeannie Done, MD      . metoCLOPramide (REGLAN) injection 5 mg  5 mg Intravenous Q6H Nandigam, Kavitha V, MD   5 mg at 02/18/21 0100  . mometasone-formoterol (DULERA) 200-5 MCG/ACT inhaler 2 puff  2 puff Inhalation BID Reubin Milan, MD   2 puff at 02/19/21 0848  . multivitamin with minerals tablet 1 tablet  1 tablet Oral Daily Dahal, Binaya, MD   1 tablet at 02/18/21 1104  . ondansetron (ZOFRAN) tablet 4 mg  4 mg Oral Q6H PRN Reubin Milan, MD   4 mg at 02/17/21 0040   Or  . ondansetron [facility]) injection 4 mg  4 mg Intravenous Q6H PRN Reubin Milan, MD   4 mg at 02/19/21 0818  . pantoprazole (PROTONIX) injection 40 mg  40 mg Intravenous Q12H Reubin Milan, MD   40 mg at 02/19/21 1143     Allergies  Allergen Reactions  . Blueberry [Vaccinium Angustifolium] Anaphylaxis  . Mango Flavor Anaphylaxis  . Aspirin Rash  . Penicillins Hives    Has patient had a PCN reaction causing immediate rash, facial/tongue/throat swelling, SOB or lightheadedness with hypotension: Yes Has patient had a PCN reaction causing severe rash involving mucus membranes or skin necrosis: No Has patient had a PCN reaction that required hospitalization No Has patient had a PCN reaction occurring within the last 10 years: No If all of the above answers are "NO", then may proceed with Cephalosporin use.   :  Family History  Problem Relation Age of Onset  . Migraines Sister   :  Social History   Socioeconomic History  . Marital status: Single    Spouse  name: Not on file  . Number of children: Not on file  . Years of education: Not on file  . Highest education level: Not on file  Occupational History  . Not on file  Tobacco Use  . Smoking status: Former Smoker    Types: Cigarettes  . Smokeless tobacco: Never Used  Vaping Use  . Vaping Use: Never used  Substance and Sexual Activity  . Alcohol use: No  . Drug use: No  . Sexual activity: Not on file  Other Topics Concern  . Not on file  Social History Narrative  . Not on file   Social Determinants of Health   Financial Resource Strain: Not on file  Food Insecurity: Not on file  Transportation Needs:  Not on file  Physical Activity: Not on file  Stress: Not on file  Social Connections: Not on file  Intimate Partner Violence: Not on file  :  Review of Systems: A comprehensive 14 point review of systems was negative except as noted in the HPI.  Exam: Patient Vitals for the past 24 hrs:  BP Temp Temp src Pulse Resp SpO2  02/19/21 1400 129/76 98.6 F (37 C) Oral 90 16 100 %  02/19/21 1340 (!) 146/103 - - - 14 100 %  02/19/21 1330 140/87 - - - 11 100 %  02/19/21 1320 133/84 - - 88 11 100 %  02/19/21 1315 (!) 145/78 98.6 F (37 C) Oral 91 11 100 %  02/19/21 1215 (!) 146/83 98.6 F (37 C) Oral 86 13 98 %  02/19/21 1013 118/79 98 F (36.7 C) Oral 89 16 98 %  02/19/21 0848 - - - - - 95 %  02/19/21 0609 115/84 98.2 F (36.8 C) Oral 72 20 95 %  02/18/21 2104 108/65 98.4 F (36.9 C) Oral 79 18 92 %    General:  well-nourished in no acute distress.   Eyes:  no scleral icterus.   ENT:  There were no oropharyngeal lesions.     Lymphatics:  Negative cervical, supraclavicular or axillary adenopathy.   Respiratory: lungs were clear bilaterally without wheezing or crackles.   Cardiovascular:  Regular rate and rhythm, S1/S2, without murmur, rub or gallop.  There was no pedal edema.   GI: Positive bowel sounds, soft, tenderness in the epigastric area Musculoskeletal: Strength  symmetrical in the upper and lower extremities. Skin exam was without echymosis, petichae.   Neuro exam was nonfocal. Patient was alert and oriented.  Attention was good.   Language was appropriate.  Mood was normal without depression.  Speech was not pressured.  Thought content was not tangential.     Lab Results  Component Value Date   WBC 11.1 (H) 02/19/2021   HGB 7.8 (L) 02/19/2021   HCT 27.0 (L) 02/19/2021   PLT 356 02/19/2021   GLUCOSE 110 (H) 02/19/2021   CHOL  10/15/2008    137        ATP III CLASSIFICATION:  <200     mg/dL   Desirable  200-239  mg/dL   Borderline High  >=240    mg/dL   High   TRIG 155 (H) 10/15/2008   HDL 35 (L) 10/15/2008   LDLCALC  10/15/2008    71        Total Cholesterol/HDL:CHD Risk Coronary Heart Disease Risk Table                     Men   Women  1/2 Average Risk   3.4   3.3   ALT 20 02/17/2021   AST 27 02/17/2021   NA 138 02/19/2021   K 3.4 (L) 02/19/2021   CL 103 02/19/2021   CREATININE 0.87 02/19/2021   BUN 13 02/19/2021   CO2 23 02/19/2021    CT ABDOMEN PELVIS WO CONTRAST  Result Date: 02/16/2021 CLINICAL DATA:  Abdominal distension. EXAM: CT ABDOMEN AND PELVIS WITHOUT CONTRAST TECHNIQUE: Multidetector CT imaging of the abdomen and pelvis was performed following the standard protocol without IV contrast. COMPARISON:  None recent FINDINGS: Lower chest: The lung bases are clear.There is a possible filling defect within the right ventricle (axial series 2, image 4). The intracardiac blood pool is hypodense relative to the adjacent myocardium consistent with anemia. Hepatobiliary: Multiple  large hypoechoic masses are noted in the patient's liver. The largest is located in the left hepatic lobe measures approximately 10 cm. The gallbladder is not well evaluated.There is no biliary ductal dilation. Pancreas: There is an ill-defined masslike area in the pancreatic head measuring approximately 4.7 cm (axial series 2, image 41). Spleen: Unremarkable.  Adrenals/Urinary Tract: --Adrenal glands: Unremarkable. --Right kidney/ureter: No hydronephrosis or radiopaque kidney stones. --Left kidney/ureter: There is a punctate nonobstructing stone in the upper pole the left kidney. --Urinary bladder: Unremarkable. Stomach/Bowel: --Stomach/Duodenum: No hiatal hernia or other gastric abnormality. Normal duodenal course and caliber. --Small bowel: Unremarkable. --Colon: Unremarkable. --Appendix: Normal. Vascular/Lymphatic: Atherosclerotic calcification is present within the non-aneurysmal abdominal aorta, without hemodynamically significant stenosis. --No retroperitoneal lymphadenopathy. --No mesenteric lymphadenopathy. --No pelvic or inguinal lymphadenopathy. Reproductive: Status post hysterectomy. No adnexal mass. Other: No ascites or free air. The abdominal wall is normal. Musculoskeletal. No acute displaced fractures. IMPRESSION: 1. Multiple large hypoechoic masses in the patient's liver, consistent with metastatic disease. These are amenable to percutaneous biopsy as clinically indicated. 2. Ill-defined masslike area in the pancreatic head measuring approximately 4.7 cm. This is concerning for pancreatic adenocarcinoma. Exam limited by lack of IV contrast. A follow-up contrast enhanced MRI would be useful for further evaluation and staging. 3. Possible filling defect within the right ventricle. Recommend further evaluation with a dedicated cardiac echo. 4. Anemia. 5. Punctate nonobstructing stone in the upper pole of the left kidney. Aortic Atherosclerosis (ICD10-I70.0). Electronically Signed   By: Constance Holster M.D.   On: 02/16/2021 16:04   ECHOCARDIOGRAM COMPLETE  Result Date: 02/17/2021    ECHOCARDIOGRAM REPORT   Patient Name:   Wendy Hoffman Date of Exam: 02/17/2021 Medical Rec #:  [id number]       Height:       64.0 in Accession #:    [id number]      Weight:       258.1 lb Date of Birth:  08-18-1966       BSA:          2.180 m Patient Age:    63 years         BP:           117/77 mmHg Patient Gender: F               HR:           69 bpm. Exam Location:  Inpatient Procedure: 2D Echo, 3D Echo, Cardiac Doppler and Color Doppler Indications:    R94.31 Abnormal EKG  History:        Patient has prior history of Echocardiogram examinations, most                 recent 04/18/2008. COPD, Signs/Symptoms:Chest Pain; Risk                 Factors:Hypertension, Dyslipidemia and Current Smoker.  Sonographer:    Roseanna Rainbow RDCS Referring Phys: [id number] [facility]  Sonographer Comments: Technically difficult study due to poor echo windows and patient is morbidly obese. Image acquisition challenging due to patient body habitus. IMPRESSIONS  1. Left ventricular ejection fraction, by estimation, is 55 to 60%. The left ventricle has normal function. The left ventricle has no regional wall motion abnormalities. Left ventricular diastolic parameters were normal.  2. Right ventricular systolic function is normal. The right ventricular size is normal.  3. The mitral valve is normal in structure. No evidence of mitral valve regurgitation.  4. The aortic valve is normal in structure.  Aortic valve regurgitation is not visualized. FINDINGS  Left Ventricle: Left ventricular ejection fraction, by estimation, is 55 to 60%. The left ventricle has normal function. The left ventricle has no regional wall motion abnormalities. The left ventricular internal cavity size was normal in size. There is  no left ventricular hypertrophy. Left ventricular diastolic parameters were normal. Right Ventricle: The right ventricular size is normal. No increase in right ventricular wall thickness. Right ventricular systolic function is normal. Left Atrium: Left atrial size was normal in size. Right Atrium: Right atrial size was normal in size. Pericardium: There is no evidence of pericardial effusion. Mitral Valve: The mitral valve is normal in structure. No evidence of mitral valve regurgitation. Tricuspid Valve: The  tricuspid valve is grossly normal. Tricuspid valve regurgitation is trivial. Aortic Valve: The aortic valve is normal in structure. Aortic valve regurgitation is not visualized. Pulmonic Valve: The pulmonic valve was grossly normal. Pulmonic valve regurgitation is not visualized. Aorta: The aortic root and ascending aorta are structurally normal, with no evidence of dilitation. IAS/Shunts: The atrial septum is grossly normal.  LEFT VENTRICLE PLAX 2D LVIDd:         4.70 cm      Diastology LVIDs:         3.00 cm      LV e' medial:    6.85 cm/s LV PW:         1.30 cm      LV E/e' medial:  13.7 LV IVS:        1.20 cm      LV e' lateral:   9.79 cm/s LVOT diam:     2.00 cm      LV E/e' lateral: 9.6 LV SV:         88 LV SV Index:   40 LVOT Area:     3.14 cm  LV Volumes (MOD) LV vol d, MOD A2C: 97.7 ml LV vol d, MOD A4C: 146.0 ml LV vol s, MOD A2C: 42.0 ml LV vol s, MOD A4C: 46.4 ml LV SV MOD A2C:     55.7 ml LV SV MOD A4C:     146.0 ml LV SV MOD BP:      76.1 ml RIGHT VENTRICLE             IVC RV S prime:     12.80 cm/s  IVC diam: 1.50 cm TAPSE (M-mode): 2.1 cm LEFT ATRIUM             Index       RIGHT ATRIUM           Index LA diam:        3.30 cm 1.51 cm/m  RA Area:     12.00 cm LA Vol (A2C):   46.6 ml 21.37 ml/m RA Volume:   21.60 ml  9.91 ml/m LA Vol (A4C):   29.6 ml 13.58 ml/m LA Biplane Vol: 37.7 ml 17.29 ml/m  AORTIC VALVE LVOT Vmax:   131.00 cm/s LVOT Vmean:  91.900 cm/s LVOT VTI:    0.279 m  AORTA Ao Root diam: 3.70 cm MITRAL VALVE MV Area (PHT): 4.06 cm    SHUNTS MV Decel Time: 187 msec    Systemic VTI:  0.28 m MV E velocity: 93.60 cm/s  Systemic Diam: 2.00 cm MV A velocity: 84.40 cm/s MV E/A ratio:  1.11 Mertie Moores MD Electronically signed by Mertie Moores MD Signature Date/Time: 02/17/2021/3:41:34 PM    Final      CT ABDOMEN  PELVIS WO CONTRAST  Result Date: 02/16/2021 CLINICAL DATA:  Abdominal distension. EXAM: CT ABDOMEN AND PELVIS WITHOUT CONTRAST TECHNIQUE: Multidetector CT imaging of the  abdomen and pelvis was performed following the standard protocol without IV contrast. COMPARISON:  None recent FINDINGS: Lower chest: The lung bases are clear.There is a possible filling defect within the right ventricle (axial series 2, image 4). The intracardiac blood pool is hypodense relative to the adjacent myocardium consistent with anemia. Hepatobiliary: Multiple large hypoechoic masses are noted in the patient's liver. The largest is located in the left hepatic lobe measures approximately 10 cm. The gallbladder is not well evaluated.There is no biliary ductal dilation. Pancreas: There is an ill-defined masslike area in the pancreatic head measuring approximately 4.7 cm (axial series 2, image 41). Spleen: Unremarkable. Adrenals/Urinary Tract: --Adrenal glands: Unremarkable. --Right kidney/ureter: No hydronephrosis or radiopaque kidney stones. --Left kidney/ureter: There is a punctate nonobstructing stone in the upper pole the left kidney. --Urinary bladder: Unremarkable. Stomach/Bowel: --Stomach/Duodenum: No hiatal hernia or other gastric abnormality. Normal duodenal course and caliber. --Small bowel: Unremarkable. --Colon: Unremarkable. --Appendix: Normal. Vascular/Lymphatic: Atherosclerotic calcification is present within the non-aneurysmal abdominal aorta, without hemodynamically significant stenosis. --No retroperitoneal lymphadenopathy. --No mesenteric lymphadenopathy. --No pelvic or inguinal lymphadenopathy. Reproductive: Status post hysterectomy. No adnexal mass. Other: No ascites or free air. The abdominal wall is normal. Musculoskeletal. No acute displaced fractures. IMPRESSION: 1. Multiple large hypoechoic masses in the patient's liver, consistent with metastatic disease. These are amenable to percutaneous biopsy as clinically indicated. 2. Ill-defined masslike area in the pancreatic head measuring approximately 4.7 cm. This is concerning for pancreatic adenocarcinoma. Exam limited by lack of IV  contrast. A follow-up contrast enhanced MRI would be useful for further evaluation and staging. 3. Possible filling defect within the right ventricle. Recommend further evaluation with a dedicated cardiac echo. 4. Anemia. 5. Punctate nonobstructing stone in the upper pole of the left kidney. Aortic Atherosclerosis (ICD10-I70.0). Electronically Signed   By: Constance Holster M.D.   On: 02/16/2021 16:04   ECHOCARDIOGRAM COMPLETE  Result Date: 02/17/2021    ECHOCARDIOGRAM REPORT   Patient Name:   Wendy Hoffman Date of Exam: 02/17/2021 Medical Rec #:  [id number]       Height:       64.0 in Accession #:    [id number]      Weight:       258.1 lb Date of Birth:  1966-10-12       BSA:          2.180 m Patient Age:    73 years        BP:           117/77 mmHg Patient Gender: F               HR:           69 bpm. Exam Location:  Inpatient Procedure: 2D Echo, 3D Echo, Cardiac Doppler and Color Doppler Indications:    R94.31 Abnormal EKG  History:        Patient has prior history of Echocardiogram examinations, most                 recent 04/18/2008. COPD, Signs/Symptoms:Chest Pain; Risk                 Factors:Hypertension, Dyslipidemia and Current Smoker.  Sonographer:    Roseanna Rainbow RDCS Referring Phys: [id number] [facility]  Sonographer Comments: Technically difficult study due to poor echo windows and patient is morbidly obese. Image  acquisition challenging due to patient body habitus. IMPRESSIONS  1. Left ventricular ejection fraction, by estimation, is 55 to 60%. The left ventricle has normal function. The left ventricle has no regional wall motion abnormalities. Left ventricular diastolic parameters were normal.  2. Right ventricular systolic function is normal. The right ventricular size is normal.  3. The mitral valve is normal in structure. No evidence of mitral valve regurgitation.  4. The aortic valve is normal in structure. Aortic valve regurgitation is not visualized. FINDINGS  Left Ventricle: Left ventricular  ejection fraction, by estimation, is 55 to 60%. The left ventricle has normal function. The left ventricle has no regional wall motion abnormalities. The left ventricular internal cavity size was normal in size. There is  no left ventricular hypertrophy. Left ventricular diastolic parameters were normal. Right Ventricle: The right ventricular size is normal. No increase in right ventricular wall thickness. Right ventricular systolic function is normal. Left Atrium: Left atrial size was normal in size. Right Atrium: Right atrial size was normal in size. Pericardium: There is no evidence of pericardial effusion. Mitral Valve: The mitral valve is normal in structure. No evidence of mitral valve regurgitation. Tricuspid Valve: The tricuspid valve is grossly normal. Tricuspid valve regurgitation is trivial. Aortic Valve: The aortic valve is normal in structure. Aortic valve regurgitation is not visualized. Pulmonic Valve: The pulmonic valve was grossly normal. Pulmonic valve regurgitation is not visualized. Aorta: The aortic root and ascending aorta are structurally normal, with no evidence of dilitation. IAS/Shunts: The atrial septum is grossly normal.  LEFT VENTRICLE PLAX 2D LVIDd:         4.70 cm      Diastology LVIDs:         3.00 cm      LV e' medial:    6.85 cm/s LV PW:         1.30 cm      LV E/e' medial:  13.7 LV IVS:        1.20 cm      LV e' lateral:   9.79 cm/s LVOT diam:     2.00 cm      LV E/e' lateral: 9.6 LV SV:         88 LV SV Index:   40 LVOT Area:     3.14 cm  LV Volumes (MOD) LV vol d, MOD A2C: 97.7 ml LV vol d, MOD A4C: 146.0 ml LV vol s, MOD A2C: 42.0 ml LV vol s, MOD A4C: 46.4 ml LV SV MOD A2C:     55.7 ml LV SV MOD A4C:     146.0 ml LV SV MOD BP:      76.1 ml RIGHT VENTRICLE             IVC RV S prime:     12.80 cm/s  IVC diam: 1.50 cm TAPSE (M-mode): 2.1 cm LEFT ATRIUM             Index       RIGHT ATRIUM           Index LA diam:        3.30 cm 1.51 cm/m  RA Area:     12.00 cm LA Vol (A2C):    46.6 ml 21.37 ml/m RA Volume:   21.60 ml  9.91 ml/m LA Vol (A4C):   29.6 ml 13.58 ml/m LA Biplane Vol: 37.7 ml 17.29 ml/m  AORTIC VALVE LVOT Vmax:   131.00 cm/s LVOT Vmean:  91.900 cm/s LVOT VTI:  0.279 m  AORTA Ao Root diam: 3.70 cm MITRAL VALVE MV Area (PHT): 4.06 cm    SHUNTS MV Decel Time: 187 msec    Systemic VTI:  0.28 m MV E velocity: 93.60 cm/s  Systemic Diam: 2.00 cm MV A velocity: 84.40 cm/s MV E/A ratio:  1.11 Mertie Moores MD Electronically signed by Mertie Moores MD Signature Date/Time: 02/17/2021/3:41:34 PM    Final    Assessment and Plan:  1.  Duodenal mass, biopsy pending -CT abdomen/pelvis without contrast 02/16/2021-multiple large hypoechoic masses in the patient's liver consistent metastatic disease, ill-defined masslike area in the pancreatic head measuring approximate 4.7 cm, possible filling defect in the right ventricle. -EGD 02/19/2021-large infiltrative mass with bleeding in the second portion of the duodenum, appears to be arising near the ampulla, partially obstructive with friable mucosa. -Flexible sigmoidoscopy 02/19/2021-diverticulosis in the sigmoid colon, nonbleeding external and internal hemorrhoids 2.  Iron deficiency anemia 3.  Protein calorie malnutrition 4.  Possible right ventricular filling defect on noncontrast CT scan 5.  Hypertension 6.  History of CVA 7.  Hyperlipidemia 8.  Hypothyroidism 9.  Morbid obesity 10.  Asthma 11.  Migraines  Wendy Hoffman is a 55 year old female who presented with epigastric pain, weight loss, nausea/vomiting, and anemia.  Imaging findings concerning for a pancreatic mass and liver lesions.  EGD demonstrated a large infiltrating mass in the duodenum.  Biopsies are pending.  EGD findings and imaging findings are concerning for metastatic duodenal versus pancreatic cancer.  Findings were discussed with the patient today.  Will await biopsy to make further recommendations regarding treatment options.  Recommend a liver biopsy to  confirm metastatic disease and obtain additional tissue for additional testing.  Patient has significant iron deficiency anemia.  She would benefit from IV iron this admission.  The patient is unable to eat due to her obstructing mass.  The patient should be considered for stent placement versus palliative J-tube.  Recommendations: 1.  Await biopsy results. 2.  If a diagnosis of small bowel adenocarcinoma is confirmed, schedule ultrasound-guided biopsy of the liver to confirm metastatic disease and obtain tissue for molecular testing 3.  Recommend a dose of Feraheme 510 mg IV x1 this admission for iron deficiency anemia. 4.  Request for GI to consider stent placement versus palliative J-tube placement. 5.  Port-A-Cath placement if a diagnosis of metastatic small bowel carcinoma is confirmed 6.  Please call Oncology as needed over the weekend.  I will check on her 02/23/2021  Thank you for this referral.   Wendy Bussing, DNP, AGPCNP-BC, AOCNP  Wendy Hoffman was interviewed and examined.  I reviewed the CT images and endoscopy report.  The pathology from the EGD 02/19/2021 is pending.  The clinical presentation is consistent with a diagnosis of metastatic small bowel carcinoma.  She is symptomatic with nausea/vomiting, potentially related to obstruction from the duodenal mass.  We will need to address nutrition.  She may be a candidate for a stent or feeding tube if she is unable to tolerate an oral diet.  She can receive IV iron for the iron deficiency anemia.  Outpatient follow-up will be scheduled at the Cancer center   I was present for greater than 50% of today's visit.  I performed medical decision making.

## 2021-02-19 NOTE — Op Note (Signed)
[facility] Patient Name: Wendy Hoffman Procedure Date: 02/19/2021 MRN: [id number] Attending MD: Mauri Pole , MD Date of Birth: 02/21/1966 CSN: [id number] Age: 55 Admit Type: Inpatient Procedure:                Flexible Sigmoidoscopy Indications:              Gastrointestinal occult blood loss, Abnormal CT of                            the GI tract Providers:                Mauri Pole, MD, Baird [facility], RN, Lesia Sago, Technician Referring MD:              Medicines:                Monitored Anesthesia Care Complications:            No immediate complications. Estimated Blood Loss:     Estimated blood loss was minimal. Procedure:                Pre-Anesthesia Assessment:                           - Prior to the procedure, a History and Physical                            was performed, and patient medications and                            allergies were reviewed. The patient's tolerance of                            previous anesthesia was also reviewed. The risks                            and benefits of the procedure and the sedation                            options and risks were discussed with the patient.                            All questions were answered, and informed consent                            was obtained. Prior Anticoagulants: The patient has                            taken no previous anticoagulant or antiplatelet                            agents. ASA Grade Assessment: III - A patient with  severe systemic disease. After reviewing the risks                            and benefits, the patient was deemed in                            satisfactory condition to undergo the procedure.                           After obtaining informed consent, the scope was                            passed under direct vision. The GIF-H190 ([id number])                             Olympus gastroscope was introduced through the anus                            and advanced to the the descending colon. The                            flexible sigmoidoscopy was accomplished without                            difficulty. The patient tolerated the procedure                            well. The quality of the bowel preparation was fair. Scope In: 1:04:59 PM Scope Out: 1:09:13 PM Total Procedure Duration: 0 hours 4 minutes 14 seconds  Findings:      The perianal and digital rectal examinations were normal.      Scattered small-mouthed diverticula were found in the sigmoid colon.      Non-bleeding external and internal hemorrhoids were found during       retroflexion. The hemorrhoids were small.      The exam was otherwise without abnormality. Impression:               - Preparation of the colon was fair.                           - Diverticulosis in the sigmoid colon.                           - Non-bleeding external and internal hemorrhoids.                           - The examination was otherwise normal.                           - No specimens collected. Moderate Sedation:      Not Applicable - Patient had care per Anesthesia. Recommendation:           See other procedure note for details and  recommendations Procedure Code(s):        --- Professional ---                           [phone], Sigmoidoscopy, flexible; diagnostic,                            including collection of specimen(s) by brushing or                            washing, when performed (separate procedure) Diagnosis Code(s):        --- Professional ---                           K64.8, Other hemorrhoids                           R19.5, Other fecal abnormalities                           K57.30, Diverticulosis of large intestine without                            perforation or abscess without bleeding                           R93.3, Abnormal findings on diagnostic imaging of                             other parts of digestive tract CPT copyright 2019 [employer]. All rights reserved. The codes documented in this report are preliminary and upon coder review may  be revised to meet current compliance requirements. Mauri Pole, MD 02/19/2021 1:16:12 PM This report has been signed electronically. Number of Addenda: 0

## 2021-02-19 NOTE — Interval H&P Note (Signed)
History and Physical Interval Note:  02/19/2021 12:41 PM  Wendy Hoffman  has presented today for surgery, with the diagnosis of iron deficiency anemia.  The various methods of treatment have been discussed with the patient and family. After consideration of risks, benefits and other options for treatment, the patient has consented to  Procedure(s): FLEXIBLE SIGMOIDOSCOPY (N/A) ESOPHAGOGASTRODUODENOSCOPY (EGD) WITH PROPOFOL (N/A) as a surgical intervention.  The patient's history has been reviewed, patient examined, no change in status, stable for surgery.  I have reviewed the patient's chart and labs.  Questions were answered to the patient's satisfaction.     [name]

## 2021-02-19 NOTE — Anesthesia Postprocedure Evaluation (Signed)
Anesthesia Post Note  Patient: Wendy Hoffman  Procedure(s) Performed: FLEXIBLE SIGMOIDOSCOPY (N/A ) ESOPHAGOGASTRODUODENOSCOPY (EGD) WITH PROPOFOL (N/A ) BIOPSY     Patient location during evaluation: PACU Anesthesia Type: MAC Level of consciousness: awake and alert Pain management: pain level controlled Vital Signs Assessment: post-procedure vital signs reviewed and stable Respiratory status: spontaneous breathing, nonlabored ventilation, respiratory function stable and patient connected to nasal cannula oxygen Cardiovascular status: stable and blood pressure returned to baseline Postop Assessment: no apparent nausea or vomiting Anesthetic complications: no   No complications documented.  Last Vitals:  Vitals:   02/19/21 1320 02/19/21 1330  BP: 133/84 140/87  Pulse: 88   Resp: 11 11  Temp:    SpO2: 100% 100%    Last Pain:  Vitals:   02/19/21 1330  TempSrc:   PainSc: 0-No pain                 Hussam Muniz S

## 2021-02-19 NOTE — Progress Notes (Signed)
GI MD Dr. Silverio Decamp updated via phone that Pt is unable to tolerate oral bowel prep Pt vomiting. Last Stool Pt had was liquid however color was dark brown.

## 2021-02-19 NOTE — Transfer of Care (Signed)
Immediate Anesthesia Transfer of Care Note  Patient: Wendy Hoffman  Procedure(s) Performed: FLEXIBLE SIGMOIDOSCOPY (N/A ) ESOPHAGOGASTRODUODENOSCOPY (EGD) WITH PROPOFOL (N/A ) BIOPSY  Patient Location: PACU  Anesthesia Type:MAC  Level of Consciousness: awake, alert  and oriented  Airway & Oxygen Therapy: Patient Spontanous Breathing and Patient connected to face mask oxygen  Post-op Assessment: Report given to RN, Post -op Vital signs reviewed and stable and Patient moving all extremities X 4  Post vital signs: Reviewed and stable  Last Vitals:  Vitals Value Taken Time  BP    Temp    Pulse    Resp    SpO2      Last Pain:  Vitals:   02/19/21 1215  TempSrc: Oral  PainSc: 9       Patients Stated Pain Goal: 2 (42/76/70 1100)  Complications: No complications documented.

## 2021-02-20 ENCOUNTER — Encounter (HOSPITAL_COMMUNITY): Payer: Self-pay | Admitting: Gastroenterology

## 2021-02-20 ENCOUNTER — Ambulatory Visit (HOSPITAL_COMMUNITY)
Admit: 2021-02-20 | Discharge: 2021-02-20 | Disposition: A | Discharge status: Home / Self Care | Payer: Medicaid Other | Attending: Internal Medicine | Admitting: Internal Medicine

## 2021-02-20 DIAGNOSIS — C250 Malignant neoplasm of head of pancreas: Secondary | ICD-10-CM

## 2021-02-20 DIAGNOSIS — K3189 Other diseases of stomach and duodenum: Secondary | ICD-10-CM

## 2021-02-20 DIAGNOSIS — K922 Gastrointestinal hemorrhage, unspecified: Secondary | ICD-10-CM

## 2021-02-20 LAB — CBC WITH DIFFERENTIAL/PLATELET
Abs Immature Granulocytes: 0.07 10*3/uL (ref 0.00–0.07)
Basophils Absolute: 0 10*3/uL (ref 0.0–0.1)
Basophils Relative: 0 %
Eosinophils Absolute: 0.5 10*3/uL (ref 0.0–0.5)
Eosinophils Relative: 5 %
HCT: 22.6 % — ABNORMAL LOW (ref 36.0–46.0)
Hemoglobin: 6.8 g/dL — CL (ref 12.0–15.0)
Immature Granulocytes: 1 %
Lymphocytes Relative: 17 %
Lymphs Abs: 1.7 10*3/uL (ref 0.7–4.0)
MCH: 23.3 pg — ABNORMAL LOW (ref 26.0–34.0)
MCHC: 30.1 g/dL (ref 30.0–36.0)
MCV: 77.4 fL — ABNORMAL LOW (ref 80.0–100.0)
Monocytes Absolute: 0.6 10*3/uL (ref 0.1–1.0)
Monocytes Relative: 6 %
Neutro Abs: 7 10*3/uL (ref 1.7–7.7)
Neutrophils Relative %: 71 %
Platelets: 286 10*3/uL (ref 150–400)
RBC: 2.92 MIL/uL — ABNORMAL LOW (ref 3.87–5.11)
RDW: 23.8 % — ABNORMAL HIGH (ref 11.5–15.5)
WBC: 10 10*3/uL (ref 4.0–10.5)
nRBC: 0.2 % (ref 0.0–0.2)

## 2021-02-20 LAB — PREPARE RBC (CROSSMATCH)

## 2021-02-20 LAB — BASIC METABOLIC PANEL
Anion gap: 10 (ref 5–15)
BUN: 9 mg/dL (ref 6–20)
CO2: 26 mmol/L (ref 22–32)
Calcium: 9.1 mg/dL (ref 8.9–10.3)
Chloride: 104 mmol/L (ref 98–111)
Creatinine, Ser: 0.67 mg/dL (ref 0.44–1.00)
GFR, Estimated: >60 mL/min (ref >60)
Glucose, Bld: 100 mg/dL — ABNORMAL HIGH (ref 70–99)
Potassium: 3.4 mmol/L — ABNORMAL LOW (ref 3.5–5.1)
Sodium: 140 mmol/L (ref 135–145)

## 2021-02-20 MED ORDER — SODIUM CHLORIDE 0.9% IV SOLUTION: Freq: Once | INTRAVENOUS | Status: DC

## 2021-02-20 MED ORDER — LORAZEPAM 2 MG/ML IJ SOLN
1.0000 mg | Freq: Once | INTRAMUSCULAR | Status: AC
  Administered 2021-02-20: 1 mg via INTRAVENOUS

## 2021-02-20 MED ORDER — SODIUM CHLORIDE 0.9 % IV SOLN
510.0000 mg | INTRAVENOUS | Status: AC
  Administered 2021-02-20 – 2021-02-27 (×2): 510 mg via INTRAVENOUS
  Filled 2021-02-20: qty 17
  Filled 2021-02-20: qty 510

## 2021-02-20 MED ORDER — GADOBUTROL 1 MMOL/ML IV SOLN
10.0000 mL | Freq: Once | INTRAVENOUS | Status: AC | PRN
  Administered 2021-02-20: 10 mL via INTRAVENOUS

## 2021-02-20 MED ORDER — LORAZEPAM 2 MG/ML IJ SOLN
INTRAMUSCULAR | Status: AC
  Filled 2021-02-20: qty 1

## 2021-02-20 NOTE — Progress Notes (Signed)
PROGRESS NOTE  Wendy Hoffman  DOB: 1966-05-03  PCP: Patient, No Pcp Per ZOX:[id number]  DOA: 02/16/2021  LOS: 4 days   Chief Complaint  Patient presents with  . Abdominal Pain  . Emesis   Brief narrative: Wendy Hoffman is a 55 y.o. female with PMH significant for morbid obesity, HTN, HLD, cervical cancer/hysterectomy, chronic iron deficiency anemia, anxiety, osteoarthritis, asthma/COPD, hypothyroidism, insomnia, migraine headaches.  Patient presented to the ED on 3/7 with nausea, vomiting, abdominal pain, bloating and 38 pound weight loss in 2 months. In the ED, fecal occult blood test was positive. CBC with a WBC count of 12.9, hemoglobin low at 5.8, it was 12.87 months ago Lipase elevated to 266 CT abdomen/pelvis without contrast showed multiple large hypoechoic masses in the patient's liver consistent with metastatic disease. There is an ill-defined masslike area in the pancreatic head measuring about 4.7 cm. MRI of the abdomen was suggested for further evaluation and staging.  There is a possible filling defect within the right ventricle.  Echocardiogram suggested.   Patient was admitted to hospitalist service. See below for details.  Subjective: Patient was seen and examined this morning.   Sitting up in chair. In tears because of her diagnosis. Was getting a unit of blood transfusion this morning.  Went for cardiac MRI later this morning.  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 -CT scan finding as above with pancreatic head mass of 4.7 cm with multiple liver mets -GI and oncology consultation were obtained. -EGD 3/10 showed large infiltrative mass with bleeding in the second portion of the duodenum, appears to be arising near the ampulla, partially obstructive with friable mucosa.  Pending biopsy report. -MRI abdomen pending.  IR planning for biopsy of the liver mass.  Acute on chronic GI  bleeding  Iron deficiency anemia -Hemoglobin low at 5 on admission. -3 units of PRBC transfused so far.  Hemoglobin trend as below.  -Transfuse to keep hemoglobin more than 7. -IV Feraheme ordered Recent Labs    02/16/21 2252 02/17/21 0845 02/18/21 0933 02/19/21 0431 02/20/21 0450  HGB 5.0* 7.3* 7.4* 7.8* 6.8*  MCV  --  76.5* 77.2* 78.7* 77.4*  VITAMINB12 330  --   --   --   --   FOLATE 12.0  --   --   --   --   FERRITIN 7*  --   --   --   --   TIBC 400  --   --   --   --   IRON 16*  --   --   --   --   RETICCTPCT 1.7  --   --   --   --    Filling defect on the right ventricle -Per CT abdomen -Echocardiogram was obtained.  I discussed with cardiologist Dr. Acie Fredrickson and Dr. Reather Littler.  Underwent cardiac MRI today.  Report pending.  Hypertension -Amlodipine on hold.  Blood pressure stable.  Continue to monitor  High cholesterol/Aortic atherosclerosis -Continue rosuvastatin.  Hypothyroidism -Continue Synthroid  Morbid obesity  -Body mass index is 44.3 kg/m. Patient has been advised to make an attempt to improve diet and exercise patterns to aid in weight loss.  COPD -Supplemental oxygen as needed. -Bronchodilators as needed.  Mobility: Encourage ambulation Code Status:   Code Status: Full Code  Nutritional status: Body mass index is 44.3 kg/m. Nutrition Problem: Inadequate protein intake Etiology: other (see comment) (current diet order) Signs/Symptoms: other (comment) (CLD does not  meet estimated protein need) Diet Order            Diet NPO time specified Except for: Sips with Meds  Diet effective ____                 DVT prophylaxis: SCDs Start: 02/16/21 1723   Antimicrobials:  None Fluid: Oral intake is poor.  Continue IV fluid Consultants: GI Family Communication:  None at bedside  Status is: Inpatient  Remains inpatient appropriate because: Pending liver biopsy probably on Monday  Dispo: The patient is from: Home              Anticipated d/c is  to: Home in 2 to 3 days              Patient currently is not medically stable to d/c.   Difficult to place patient No       Infusions:  . sodium chloride 75 mL/hr at 02/18/21 1259    Scheduled Meds: . (feeding supplement) PROSource Plus  30 mL Oral TID BM  . sodium chloride   Intravenous Once  . sodium chloride   Intravenous Once  . feeding supplement  1 Container Oral TID BM  . gabapentin  300 mg Oral QHS  . metoCLOPramide (REGLAN) injection  5 mg Intravenous Q6H  . mometasone-formoterol  2 puff Inhalation BID  . multivitamin with minerals  1 tablet Oral Daily  . pantoprazole (PROTONIX) IV  40 mg Intravenous Q12H    Antimicrobials: Anti-infectives (From admission, onward)   None      PRN meds: albuterol, hydrocortisone cream, HYDROmorphone (DILAUDID) injection, iohexol, ondansetron **OR** ondansetron (ZOFRAN) IV   Objective: Vitals:   02/20/21 0911 02/20/21 1112  BP: 134/87 116/65  Pulse: 83 89  Resp:  18  Temp: 98.3 F (36.8 C) 98.7 F (37.1 C)  SpO2: 100% 98%    Intake/Output Summary (Last 24 hours) at 02/20/2021 1524 Last data filed at 02/20/2021 1110 Gross per 24 hour  Intake 806.5 ml  Output 1200 ml  Net -393.5 ml   Filed Weights   02/16/21 1214 02/16/21 1913  Weight: 118.4 kg 117.1 kg   Weight change:  Body mass index is 44.3 kg/m.   Physical Exam: General exam: Pleasant, middle-aged African-American female.  Intermittent partially controlled pain Skin: No rashes, lesions or ulcers. HEENT: Atraumatic, normocephalic, no obvious bleeding Lungs: Clear to auscultation bilaterally CVS: Regular rate and rhythm, no murmur GI/Abd soft, epigastric tenderness present, nondistended, bowel sound present CNS: Alert, awake, oriented x3. Psychiatry: Depressed look Extremities: No pedal edema, no calf tenderness  Data Review: I have personally reviewed the laboratory data and studies available.  Recent Labs  Lab 02/16/21 1224 02/16/21 2252  02/17/21 0845 02/18/21 0933 02/19/21 0431 02/20/21 0450  WBC 12.9*  --  10.0 12.7* 11.1* 10.0  NEUTROABS  --   --   --  9.8* 8.0* 7.0  HGB 5.8* 5.0* 7.3* 7.4* 7.8* 6.8*  HCT 19.8* 17.8* 24.4* 25.0* 27.0* 22.6*  MCV 69.0*  --  76.5* 77.2* 78.7* 77.4*  PLT 557*  --  365 364 356 286   Recent Labs  Lab 02/16/21 1224 02/16/21 2252 02/17/21 0845 02/18/21 0933 02/19/21 0431 02/20/21 0450  NA 137  --  137 138 138 140  K 3.8  --  3.9 3.8 3.4* 3.4*  CL 102  --  104 103 103 104  CO2 23  --  26 24 23 26   GLUCOSE 116*  --  101* 99 110* 100*  BUN 14  --  12 15 13 9   CREATININE 0.77  --  0.79 0.94 0.87 0.67  CALCIUM 9.7  --  9.5 9.4 9.4 9.1  MG  --  2.1  --   --   --   --   PHOS  --  3.7  --   --   --   --     F/u labs ordered Unresulted Labs (From admission, onward)          Start     Ordered   02/20/21 1411  Protime-INR  Once,   R        02/20/21 1410   02/19/21 9030  Basic metabolic panel  Daily,   R      02/18/21 0756   02/19/21 0500  CBC with Differential/Platelet  Daily,   R      02/18/21 0756          Signed, Terrilee Croak, MD [facility] 02/20/2021

## 2021-02-20 NOTE — Progress Notes (Signed)
Critical called in at 0520 hemoglobin of 6.8. Provider notified.

## 2021-02-20 NOTE — Progress Notes (Signed)
Patient ID: Wendy Hoffman, female   DOB: 01/13/1966, 55 y.o.   MRN: [id number] Aware of request for liver lesion bx in pt. Case reviewed by Dr. Serafina Royals who recommends waiting on MRI abd before bx. Duodenal path from EGD also pending. Will need hgb > 7 prior to performing bx.

## 2021-02-21 ENCOUNTER — Inpatient Hospital Stay (HOSPITAL_COMMUNITY): Payer: Medicaid Other

## 2021-02-21 DIAGNOSIS — R935 Abnormal findings on diagnostic imaging of other abdominal regions, including retroperitoneum: Secondary | ICD-10-CM

## 2021-02-21 DIAGNOSIS — R1013 Epigastric pain: Secondary | ICD-10-CM

## 2021-02-21 LAB — CBC WITH DIFFERENTIAL/PLATELET
Abs Immature Granulocytes: 0.09 10*3/uL — ABNORMAL HIGH (ref 0.00–0.07)
Basophils Absolute: 0 10*3/uL (ref 0.0–0.1)
Basophils Relative: 0 %
Eosinophils Absolute: 0.5 10*3/uL (ref 0.0–0.5)
Eosinophils Relative: 5 %
HCT: 23.8 % — ABNORMAL LOW (ref 36.0–46.0)
Hemoglobin: 7.2 g/dL — ABNORMAL LOW (ref 12.0–15.0)
Immature Granulocytes: 1 %
Lymphocytes Relative: 16 %
Lymphs Abs: 1.4 10*3/uL (ref 0.7–4.0)
MCH: 24.1 pg — ABNORMAL LOW (ref 26.0–34.0)
MCHC: 30.3 g/dL (ref 30.0–36.0)
MCV: 79.6 fL — ABNORMAL LOW (ref 80.0–100.0)
Monocytes Absolute: 0.6 10*3/uL (ref 0.1–1.0)
Monocytes Relative: 7 %
Neutro Abs: 6.5 10*3/uL (ref 1.7–7.7)
Neutrophils Relative %: 71 %
Platelets: 277 10*3/uL (ref 150–400)
RBC: 2.99 MIL/uL — ABNORMAL LOW (ref 3.87–5.11)
RDW: 23.6 % — ABNORMAL HIGH (ref 11.5–15.5)
WBC: 9.1 10*3/uL (ref 4.0–10.5)
nRBC: 0.2 % (ref 0.0–0.2)

## 2021-02-21 LAB — TYPE AND SCREEN
ABO/RH(D): A POS
Antibody Screen: NEGATIVE
Unit division: 0

## 2021-02-21 LAB — BPAM RBC
Blood Product Expiration Date: 202203282359
ISSUE DATE / TIME: 202203110843
Unit Type and Rh: 6200

## 2021-02-21 LAB — BASIC METABOLIC PANEL
Anion gap: 10 (ref 5–15)
BUN: 9 mg/dL (ref 6–20)
CO2: 25 mmol/L (ref 22–32)
Calcium: 9 mg/dL (ref 8.9–10.3)
Chloride: 105 mmol/L (ref 98–111)
Creatinine, Ser: 0.65 mg/dL (ref 0.44–1.00)
GFR, Estimated: >60 mL/min (ref >60)
Glucose, Bld: 107 mg/dL — ABNORMAL HIGH (ref 70–99)
Potassium: 3 mmol/L — ABNORMAL LOW (ref 3.5–5.1)
Sodium: 140 mmol/L (ref 135–145)

## 2021-02-21 LAB — PROTIME-INR
INR: 0.9 (ref 0.8–1.2)
Prothrombin Time: 11.9 seconds (ref 11.4–15.2)

## 2021-02-21 MED ORDER — GADOBUTROL 1 MMOL/ML IV SOLN
10.0000 mL | Freq: Once | INTRAVENOUS | Status: AC | PRN
  Administered 2021-02-21: 10 mL via INTRAVENOUS

## 2021-02-21 MED ORDER — LORAZEPAM 2 MG/ML IJ SOLN
1.0000 mg | Freq: Once | INTRAMUSCULAR | Status: AC
  Administered 2021-02-21: 1 mg via INTRAVENOUS
  Filled 2021-02-21: qty 1

## 2021-02-21 MED ORDER — ALUM & MAG HYDROXIDE-SIMETH 200-200-20 MG/5ML PO SUSP
30.0000 mL | ORAL | Status: DC | PRN
  Administered 2021-02-21: 30 mL via ORAL
  Filled 2021-02-21: qty 30

## 2021-02-21 MED ORDER — POTASSIUM CHLORIDE CRYS ER 20 MEQ PO TBCR
40.0000 meq | EXTENDED_RELEASE_TABLET | Freq: Once | ORAL | Status: AC
  Administered 2021-02-21: 40 meq via ORAL
  Filled 2021-02-21: qty 2

## 2021-02-21 NOTE — Plan of Care (Signed)
  Problem: Activity: Goal: Risk for activity intolerance will decrease Outcome: Progressing   Problem: Nutrition: Goal: Adequate nutrition will be maintained Outcome: Progressing   Problem: Coping: Goal: Level of anxiety will decrease Outcome: Progressing   Problem: Pain Managment: Goal: General experience of comfort will improve Outcome: Progressing   

## 2021-02-21 NOTE — Progress Notes (Signed)
[facility] ROUNDING NOTE   Subjective: C/o abdominal pain and pain at IV site   Objective: Vital signs in last 24 hours: Temp:  [98.2 F (36.8 C)-98.7 F (37.1 C)] 98.2 F (36.8 C) (03/12 0546) Pulse Rate:  [83-90] 83 (03/12 0546) Resp:  [14-20] 20 (03/11 2048) BP: (116-137)/(65-82) 127/80 (03/12 0546) SpO2:  [95 %-100 %] 98 % (03/12 0815) Last BM Date: 02/20/21 General: NAD   Intake/Output from previous day: 03/11 0701 - 03/12 0700 In: 1536.5 [P.O.:360; I.V.:850; Blood:326.5] Out: 1720 [Urine:1700; Emesis/NG output:20] Intake/Output this shift: No intake/output data recorded.   Lab Results: Recent Labs    02/19/21 0431 02/20/21 0450 02/21/21 0323  WBC 11.1* 10.0 9.1  HGB 7.8* 6.8* 7.2*  PLT 356 286 277  MCV 78.7* 77.4* 79.6*   BMET Recent Labs    02/19/21 0431 02/20/21 0450 02/21/21 0323  NA 138 140 140  K 3.4* 3.4* 3.0*  CL 103 104 105  CO2 23 26 25   GLUCOSE 110* 100* 107*  BUN 13 9 9   CREATININE 0.87 0.67 0.65  CALCIUM 9.4 9.1 9.0   LFT No results for input(s): PROT, ALBUMIN, AST, ALT, ALKPHOS, BILITOT, BILIDIR, IBILI in the last 72 hours. PT/INR Recent Labs    02/20/21 2006  INR 0.9      Imaging/Other results: MR CARDIAC MORPHOLOGY W WO CONTRAST  Result Date: 02/21/2021 CLINICAL DATA:  RV filling defect on CT EXAM: CARDIAC MRI TECHNIQUE: The patient was scanned on a 1.5 Tesla Siemens magnet. A dedicated cardiac coil was used. Functional imaging was done using Fiesta sequences. 2,3, and 4 chamber views were done to assess for RWMA's. Modified Simpson's rule using a short axis stack was used to calculate an ejection fraction on a dedicated work Conservation officer, nature. The patient received 10 cc of Gadavist. After 10 minutes inversion recovery sequences were used to assess for infiltration and scar tissue. CONTRAST:  10 cc  of Gadavist FINDINGS: Left ventricle: -Normal size -Normal systolic function -Elevated ECV (36%) -No LGE  LV EF:  53% (Normal 56-78%) Absolute volumes: LV EDV: 182mL (Normal 52-141 mL) LV ESV: 31mL (Normal 13-51 mL) LV SV: 39mL (Normal 33-97 mL) CO: 7.4L/min (Normal 2.7-6.0 L/min) Indexed volumes: LV EDV: 71mL/sq-m (Normal 41-81 mL/sq-m) LV ESV: 107mL/sq-m (Normal 12-21 mL/sq-m) LV SV: 11mL/sq-m (Normal 26-56 mL/sq-m) CI: 3.2L/min/sq-m (Normal 1.8-3.8 L/min/sq-m) Right ventricle: -Normal size and systolic function -Mass in mid RV attached to interventricular septum measuring 44mm x 53mm. Mass is heterogenous, with central area of low T1/high T2 and peripheral area of high T1. Dark on EGE but heterogenous area of enhancement on LGE RV EF: 59% (Normal 47-80%) Absolute volumes: RV EDV: 185mL (Normal 58-154 mL) RV ESV: 56mL (Normal 12-68 mL) RV SV: 33mL (Normal 35-98 mL) CO: 7.5L/min (Normal 2.7-6 L/min) Indexed volumes: RV EDV: 65mL/sq-m (Normal 48-87 mL/sq-m) RV ESV: 65mL/sq-m (Normal 11-28 mL/sq-m) RV SV: 68mL/sq-m (Normal 27-57 mL/sq-m) CI: 3.2L/min/sq-m (Normal 1.8-3.8 L/min/sq-m) Left atrium: Mild enlargement Right atrium: Normal size Mitral valve: No regurgitation Aortic valve: No regurgitation Tricuspid valve: No regurgitation Pulmonic valve: No regurgitation Aorta: Normal proximal ascending aorta Pericardium: Normal IMPRESSION: 1. Mass in mid RV attached to interventricular septum measures 63mm x 54mm. Mass appears heterogenous, with area of enhancement on LGE imaging. Given patient with suspected malignancy, mass is concerning for metastatic tumor 2. Normal LV size and systolic function (EF 36%). No late gadolinium enhancement to suggest myocardial scar 3.  Normal RV size and systolic function (EF 64%) Electronically Signed  By: Oswaldo Milian MD   On: 02/21/2021 01:00      Assessment &Plan  55 yr female with iron deficiency anemia, CT abd & pelvis with multiple large hypoechoic lesions in liver concerning for metastatic disease, ill defined lesions in pancreatic head and friable partially obstructing  bleeding mass in 2nd portion of duodenum near ampulla  Await biopsy results (?Duodenal adeno ca vs pancreatic ca)  She is tolerating some soft diet and liquids Monitor Hgb and transfuse to >7 Will benefit from IV iron infusion (ferra heme X2) Dr Benay Spice (Oncology) was consulted  If she continues to have blood transfusion requirement and is unable to maintain nutritional status due to worsening duodenal obstruction, she may need palliative resection of the duodenal mass with gastro jejunostomy  GI is available if needed, please call with any questions. Will not round daily.    Damaris Hippo , MD [phone]  [facility]

## 2021-02-21 NOTE — Progress Notes (Signed)
PROGRESS NOTE  Wendy Hoffman  DOB: 10/26/1966  PCP: Patient, No Pcp Per [id number]  DOA: 02/16/2021  LOS: 5 days   Chief Complaint  Patient presents with  . Abdominal Pain  . Emesis   Brief narrative: Wendy Hoffman is a 55 y.o. female with PMH significant for morbid obesity, HTN, HLD, cervical cancer/hysterectomy, chronic iron deficiency anemia, anxiety, osteoarthritis, asthma/COPD, hypothyroidism, insomnia, migraine headaches.  Patient presented to the ED on 3/7 with nausea, vomiting, abdominal pain, bloating and 38 pound weight loss in 2 months. In the ED, fecal occult blood test was positive, hemoglobin low at 5.8. CT abdomen/pelvis without contrast showed multiple large hypoechoic masses in the patient's liver consistent with metastatic disease. There is an ill-defined masslike area in the pancreatic head measuring about 4.7 cm.  Admitted to hospitalist service. See below for details.  Subjective: Patient was seen and examined this morning.   Lying on bed.  Not in distress.  Tolerated some soft diet last night. Hemoglobin stable today.  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 -CT scan finding as above with pancreatic head mass of 4.7 cm with multiple liver mets -GI and oncology consultation were obtained. -EGD 3/10 showed large infiltrative mass with bleeding in the second portion of the duodenum, appears to be arising near the ampulla, partially obstructive with friable mucosa.  Pending biopsy report. -MRI abdomen pending today.  IR planning for biopsy of the liver mass on Monday -Patient able to tolerate soft diet.  Continue the same.  Stop IV fluid at this time.  Acute on chronic GI bleeding  Iron deficiency anemia -Hemoglobin on admission was low at 5 on admission. -3 units of PRBC transfused so far.  Hemoglobin trend as below, 7.2 today. -Transfuse to keep hemoglobin more than 7. -IV  Feraheme given 3/11. Recent Labs    02/16/21 2252 02/17/21 0845 02/18/21 0933 02/19/21 0431 02/20/21 0450 02/21/21 0323  HGB 5.0* 7.3* 7.4* 7.8* 6.8* 7.2*  MCV  --  76.5* 77.2* 78.7* 77.4* 79.6*  VITAMINB12 330  --   --   --   --   --   FOLATE 12.0  --   --   --   --   --   FERRITIN 7*  --   --   --   --   --   TIBC 400  --   --   --   --   --   IRON 16*  --   --   --   --   --   RETICCTPCT 1.7  --   --   --   --   --    Filling defect on the right ventricle -Per CT abdomen afternoon admission. -I discussed with cardiologist Dr. Acie Fredrickson and Dr. Reather Littler.  Underwent cardiac MRI on 3/11.  MRI showed 16 mm x 6 mm mass in the RV attached to the interventricular septum, concerning for metastatic tumor.   Hypertension -Amlodipine on hold.  Blood pressure stable.  Continue to monitor  High cholesterol/Aortic atherosclerosis -Continue rosuvastatin.  Hypothyroidism -Continue Synthroid  Morbid obesity  -Body mass index is 44.3 kg/m. Patient has been advised to make an attempt to improve diet and exercise patterns to aid in weight loss.  COPD -Supplemental oxygen as needed. -Bronchodilators as needed.  Mobility: Encourage ambulation Code Status:   Code Status: Full Code  Nutritional status: Body mass index is 44.3 kg/m. Nutrition Problem: Inadequate protein intake Etiology: other (  see comment) (current diet order) Signs/Symptoms: other (comment) (CLD does not meet estimated protein need) Diet Order            DIET SOFT Room service appropriate? Yes; Fluid consistency: Thin  Diet effective now                 DVT prophylaxis: SCDs Start: 02/16/21 1723   Antimicrobials:  None Fluid: Oral intake is poor. Continue IV fluid Consultants: GI Family Communication:  None at bedside  Status is: Inpatient  Remains inpatient appropriate because: Pending liver biopsy probably on Monday  Dispo: The patient is from: Home              Anticipated d/c is to: Home in 2 to 3  days              Patient currently is not medically stable to d/c.   Difficult to place patient No       Infusions:  . ferumoxytol 510 mg (02/20/21 1821)    Scheduled Meds: . (feeding supplement) PROSource Plus  30 mL Oral TID BM  . sodium chloride   Intravenous Once  . sodium chloride   Intravenous Once  . feeding supplement  1 Container Oral TID BM  . gabapentin  300 mg Oral QHS  . metoCLOPramide (REGLAN) injection  5 mg Intravenous Q6H  . mometasone-formoterol  2 puff Inhalation BID  . multivitamin with minerals  1 tablet Oral Daily  . pantoprazole (PROTONIX) IV  40 mg Intravenous Q12H    Antimicrobials: Anti-infectives (From admission, onward)   None      PRN meds: albuterol, hydrocortisone cream, HYDROmorphone (DILAUDID) injection, iohexol, ondansetron **OR** ondansetron (ZOFRAN) IV   Objective: Vitals:   02/21/21 0546 02/21/21 0815  BP: 127/80   Pulse: 83   Resp:    Temp: 98.2 F (36.8 C)   SpO2: 97% 98%    Intake/Output Summary (Last 24 hours) at 02/21/2021 0952 Last data filed at 02/21/2021 0932 Gross per 24 hour  Intake 1536.5 ml  Output 520 ml  Net 1016.5 ml   Filed Weights   02/16/21 1214 02/16/21 1913  Weight: 118.4 kg 117.1 kg   Weight change:  Body mass index is 44.3 kg/m.   Physical Exam: General exam: Pleasant, middle-aged African-American female.  Intermittent partially controlled pain Skin: No rashes, lesions or ulcers. HEENT: Atraumatic, normocephalic, no obvious bleeding Lungs: Clear to auscultation bilaterally CVS: Regular rate and rhythm, no murmur GI/Abd soft, epigastric tenderness present, nondistended, bowel sound present CNS: Alert, awake, oriented x3. Psychiatry: Depressed look Extremities: No pedal edema, no calf tenderness  Data Review: I have personally reviewed the laboratory data and studies available.  Recent Labs  Lab 02/17/21 0845 02/18/21 0933 02/19/21 0431 02/20/21 0450 02/21/21 0323  WBC 10.0 12.7*  11.1* 10.0 9.1  NEUTROABS  --  9.8* 8.0* 7.0 6.5  HGB 7.3* 7.4* 7.8* 6.8* 7.2*  HCT 24.4* 25.0* 27.0* 22.6* 23.8*  MCV 76.5* 77.2* 78.7* 77.4* 79.6*  PLT 365 364 356 286 277   Recent Labs  Lab 02/16/21 2252 02/17/21 0845 02/18/21 0933 02/19/21 0431 02/20/21 0450 02/21/21 0323  NA  --  137 138 138 140 140  K  --  3.9 3.8 3.4* 3.4* 3.0*  CL  --  104 103 103 104 105  CO2  --  26 24 23 26 25   GLUCOSE  --  101* 99 110* 100* 107*  BUN  --  12 15 13 9 9   CREATININE  --  0.79 0.94 0.87 0.67 0.65  CALCIUM  --  9.5 9.4 9.4 9.1 9.0  MG 2.1  --   --   --   --   --   PHOS 3.7  --   --   --   --   --     F/u labs ordered Unresulted Labs (From admission, onward)         None      Signed, Terrilee Croak, MD [facility] 02/21/2021

## 2021-02-22 NOTE — Plan of Care (Signed)

## 2021-02-22 NOTE — Progress Notes (Signed)
PROGRESS NOTE  Wendy Hoffman  DOB: 11-11-1966  PCP: Patient, No Pcp Per PPJ:[id number]  DOA: 02/16/2021  LOS: 6 days   Chief Complaint  Patient presents with  . Abdominal Pain  . Emesis   Brief narrative: Wendy Hoffman is a 55 y.o. female with PMH significant for morbid obesity, HTN, HLD, cervical cancer/hysterectomy, chronic iron deficiency anemia, anxiety, osteoarthritis, asthma/COPD, hypothyroidism, insomnia, migraine headaches.  Patient presented to the ED on 3/7 with nausea, vomiting, abdominal pain, bloating and 38 pound weight loss in 2 months. In the ED, fecal occult blood test was positive, hemoglobin low at 5.8. CT abdomen/pelvis without contrast showed multiple large hypoechoic masses in the patient's liver consistent with metastatic disease. There is an ill-defined masslike area in the pancreatic head measuring about 4.7 cm.  Admitted to hospitalist service. See below for details.  Subjective: Patient was seen and examined this morning.   Nauseated.  Able to tolerate soft diet.  No labs today.  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 -CT scan finding as above with pancreatic head mass of 4.7 cm with multiple liver mets -GI and oncology consultation were obtained. -EGD 3/10 showed large infiltrative mass with bleeding in the second portion of the duodenum, appears to be arising near the ampulla, partially obstructive with friable mucosa.  Pending biopsy report. -MRI abdomen pending today.  IR planning for biopsy of the liver mass on Monday -Patient able to tolerate soft diet.  Continue the same.    Acute on chronic GI bleeding  Iron deficiency anemia -Hemoglobin on admission was low at 5 on admission. -3 units of PRBC transfused so far.  Hemoglobin trend as below, 7.2 on last check on 3/12.  Blood work tomorrow. -Transfuse to keep hemoglobin more than 7. -IV Feraheme given 3/11. Recent  Labs    02/16/21 2252 02/17/21 0845 02/18/21 0933 02/19/21 0431 02/20/21 0450 02/21/21 0323  HGB 5.0* 7.3* 7.4* 7.8* 6.8* 7.2*  MCV  --  76.5* 77.2* 78.7* 77.4* 79.6*  VITAMINB12 330  --   --   --   --   --   FOLATE 12.0  --   --   --   --   --   FERRITIN 7*  --   --   --   --   --   TIBC 400  --   --   --   --   --   IRON 16*  --   --   --   --   --   RETICCTPCT 1.7  --   --   --   --   --    Filling defect on the right ventricle -Per CT abdomen afternoon admission. -I discussed with cardiologist Dr. Acie Fredrickson and Dr. Reather Littler.  Underwent cardiac MRI on 3/11.  MRI showed 16 mm x 6 mm mass in the RV attached to the interventricular septum, concerning for metastatic tumor.   Hypertension -Amlodipine on hold.  Blood pressure stable.  Continue to monitor  High cholesterol/Aortic atherosclerosis -Continue rosuvastatin.  Hypothyroidism -Continue Synthroid  Morbid obesity  -Body mass index is 44.3 kg/m. Patient has been advised to make an attempt to improve diet and exercise patterns to aid in weight loss.  COPD -Supplemental oxygen as needed. -Bronchodilators as needed.  Mobility: Encourage ambulation Code Status:   Code Status: Full Code  Nutritional status: Body mass index is 44.3 kg/m. Nutrition Problem: Inadequate protein intake Etiology: other (see comment) (current  diet order) Signs/Symptoms: other (comment) (CLD does not meet estimated protein need) Diet Order            DIET SOFT Room service appropriate? Yes; Fluid consistency: Thin  Diet effective now                 DVT prophylaxis: SCDs Start: 02/16/21 1723   Antimicrobials:  None Fluid: Not on IV fluids at this time Consultants: GI Family Communication:  None at bedside  Status is: Inpatient  Remains inpatient appropriate because: Pending liver biopsy probably on Monday  Dispo: The patient is from: Home              Anticipated d/c is to: Home in 2 to 3 days              Patient currently is  not medically stable to d/c.   Difficult to place patient No       Infusions:  . ferumoxytol Stopped (02/20/21 1854)    Scheduled Meds: . (feeding supplement) PROSource Plus  30 mL Oral TID BM  . sodium chloride   Intravenous Once  . sodium chloride   Intravenous Once  . feeding supplement  1 Container Oral TID BM  . gabapentin  300 mg Oral QHS  . metoCLOPramide (REGLAN) injection  5 mg Intravenous Q6H  . mometasone-formoterol  2 puff Inhalation BID  . multivitamin with minerals  1 tablet Oral Daily  . pantoprazole (PROTONIX) IV  40 mg Intravenous Q12H    Antimicrobials: Anti-infectives (From admission, onward)   None      PRN meds: albuterol, alum & mag hydroxide-simeth, hydrocortisone cream, HYDROmorphone (DILAUDID) injection, iohexol, ondansetron **OR** ondansetron (ZOFRAN) IV   Objective: Vitals:   02/21/21 2040 02/22/21 0500  BP:  137/78  Pulse: 76 76  Resp: 16 20  Temp:  99.5 F (37.5 C)  SpO2: 95% 100%    Intake/Output Summary (Last 24 hours) at 02/22/2021 1059 Last data filed at 02/22/2021 0833 Gross per 24 hour  Intake 803.38 ml  Output 900 ml  Net -96.62 ml   Filed Weights   02/16/21 1214 02/16/21 1913  Weight: 118.4 kg 117.1 kg   Weight change:  Body mass index is 44.3 kg/m.   Physical Exam: General exam: Pleasant, middle-aged African-American female.  Intermittent partially controlled pain Skin: No rashes, lesions or ulcers. HEENT: Atraumatic, normocephalic, no obvious bleeding Lungs: Clear to auscultation bilaterally CVS: Regular rate and rhythm, no murmur GI/Abd soft, epigastric tenderness present, nondistended, bowel sound present CNS: Alert, awake, oriented x3. Psychiatry: Depressed look Extremities: No pedal edema, no calf tenderness  Data Review: I have personally reviewed the laboratory data and studies available.  Recent Labs  Lab 02/17/21 0845 02/18/21 0933 02/19/21 0431 02/20/21 0450 02/21/21 0323  WBC 10.0 12.7* 11.1*  10.0 9.1  NEUTROABS  --  9.8* 8.0* 7.0 6.5  HGB 7.3* 7.4* 7.8* 6.8* 7.2*  HCT 24.4* 25.0* 27.0* 22.6* 23.8*  MCV 76.5* 77.2* 78.7* 77.4* 79.6*  PLT 365 364 356 286 277   Recent Labs  Lab 02/16/21 2252 02/17/21 0845 02/18/21 0933 02/19/21 0431 02/20/21 0450 02/21/21 0323  NA  --  137 138 138 140 140  K  --  3.9 3.8 3.4* 3.4* 3.0*  CL  --  104 103 103 104 105  CO2  --  26 24 23 26 25   GLUCOSE  --  101* 99 110* 100* 107*  BUN  --  12 15 13 9 9   CREATININE  --  0.79 0.94 0.87 0.67 0.65  CALCIUM  --  9.5 9.4 9.4 9.1 9.0  MG 2.1  --   --   --   --   --   PHOS 3.7  --   --   --   --   --     F/u labs ordered Unresulted Labs (From admission, onward)          Start     Ordered   02/23/21 2683  Basic metabolic panel  Tomorrow morning,   R       Question:  Specimen collection method  Answer:  Lab=Lab collect   02/22/21 0802   02/23/21 0500  CBC with Differential/Platelet  Tomorrow morning,   R       Question:  Specimen collection method  Answer:  Lab=Lab collect   02/22/21 0802          Signed, Terrilee Croak, MD [facility] 02/22/2021

## 2021-02-23 ENCOUNTER — Ambulatory Visit
Admit: 2021-02-23 | Discharge: 2021-02-23 | Disposition: A | Discharge status: Home / Self Care | Payer: MEDICAID | Attending: Radiation Oncology | Admitting: Radiation Oncology

## 2021-02-23 DIAGNOSIS — C250 Malignant neoplasm of head of pancreas: Secondary | ICD-10-CM

## 2021-02-23 LAB — BASIC METABOLIC PANEL
Anion gap: 5 (ref 5–15)
BUN: 6 mg/dL (ref 6–20)
CO2: 32 mmol/L (ref 22–32)
Calcium: 9.1 mg/dL (ref 8.9–10.3)
Chloride: 102 mmol/L (ref 98–111)
Creatinine, Ser: 0.54 mg/dL (ref 0.44–1.00)
GFR, Estimated: >60 mL/min (ref >60)
Glucose, Bld: 94 mg/dL (ref 70–99)
Potassium: 3.3 mmol/L — ABNORMAL LOW (ref 3.5–5.1)
Sodium: 139 mmol/L (ref 135–145)

## 2021-02-23 LAB — CBC WITH DIFFERENTIAL/PLATELET
Abs Immature Granulocytes: 0.13 10*3/uL — ABNORMAL HIGH (ref 0.00–0.07)
Basophils Absolute: 0 10*3/uL (ref 0.0–0.1)
Basophils Relative: 0 %
Eosinophils Absolute: 0.5 10*3/uL (ref 0.0–0.5)
Eosinophils Relative: 5 %
HCT: 25 % — ABNORMAL LOW (ref 36.0–46.0)
Hemoglobin: 7.5 g/dL — ABNORMAL LOW (ref 12.0–15.0)
Immature Granulocytes: 1 %
Lymphocytes Relative: 19 %
Lymphs Abs: 1.8 10*3/uL (ref 0.7–4.0)
MCH: 23.7 pg — ABNORMAL LOW (ref 26.0–34.0)
MCHC: 30 g/dL (ref 30.0–36.0)
MCV: 78.9 fL — ABNORMAL LOW (ref 80.0–100.0)
Monocytes Absolute: 0.6 10*3/uL (ref 0.1–1.0)
Monocytes Relative: 7 %
Neutro Abs: 6.4 10*3/uL (ref 1.7–7.7)
Neutrophils Relative %: 68 %
Platelets: 292 10*3/uL (ref 150–400)
RBC: 3.17 MIL/uL — ABNORMAL LOW (ref 3.87–5.11)
RDW: 25.3 % — ABNORMAL HIGH (ref 11.5–15.5)
WBC: 9.5 10*3/uL (ref 4.0–10.5)
nRBC: 0.2 % (ref 0.0–0.2)

## 2021-02-23 LAB — SURGICAL PATHOLOGY

## 2021-02-23 MED ORDER — POTASSIUM CHLORIDE CRYS ER 20 MEQ PO TBCR
40.0000 meq | EXTENDED_RELEASE_TABLET | Freq: Once | ORAL | Status: DC
  Filled 2021-02-23: qty 2

## 2021-02-23 MED ORDER — DIPHENHYDRAMINE HCL 25 MG PO CAPS
25.0000 mg | ORAL_CAPSULE | Freq: Three times a day (TID) | ORAL | Status: DC | PRN
  Administered 2021-02-23: 25 mg via ORAL
  Filled 2021-02-23: qty 1

## 2021-02-23 MED ORDER — DIPHENHYDRAMINE HCL 25 MG PO CAPS: 25.0000 mg | ORAL_CAPSULE | Freq: Three times a day (TID) | ORAL | Status: DC | PRN

## 2021-02-23 MED ORDER — HYDROMORPHONE HCL 2 MG PO TABS
4.0000 mg | ORAL_TABLET | ORAL | Status: DC | PRN
  Administered 2021-02-23: 4 mg via ORAL
  Filled 2021-02-23: qty 2

## 2021-02-23 MED ORDER — ZOLPIDEM TARTRATE 5 MG PO TABS
5.0000 mg | ORAL_TABLET | Freq: Every day | ORAL | Status: DC
  Administered 2021-02-23: 5 mg via ORAL
  Filled 2021-02-23: qty 1

## 2021-02-23 NOTE — Progress Notes (Signed)
Radiation Oncology         [phone]) [id number] ________________________________  Name: Wendy Hoffman        MRN: [id number]  Date of Service: 02/23/21 DOB: Jul 31, 1966   REFERRING PHYSICIAN: Dr. Benay Spice  DIAGNOSIS: The primary encounter diagnosis was Symptomatic anemia. Diagnoses of Epigastric pain, Weight loss, GI bleeding, Liver mass, Pancreatic abnormality, Pancreatic cancer ([place]), and Duodenal mass were also pertinent to this visit.   HISTORY OF PRESENT ILLNESS: Wendy Hoffman is a 55 y.o. female seen at the request of Dr. Learta Codding for diagnosis of adenocarcinoma of the duodenum/pancreas.  The patient presented after experiencing abdominal pain and vomiting.  The patient came to the emergency room [place] due to abdominal pain bloating and nausea for several days as well as a 38 pound weight loss in the last 2 months.  She was admitted and imaging in the ER on 02/16/2021 of the abdomen and pelvis by CT showed multiple large hypoechoic masses in the liver consistent with metastatic disease an ill-defined masslike area in the pancreatic head measuring 4.7 cm but the exam was limited by lack of IV contrast, there was a possible filling defect within the right ventricle.  An MRI of the heart on 02/20/2021 showed mass in the mid right ventricle attached to the interventricular septum and is a concerning site for metastatic tumor, she had normal ejection fraction, her MRI of the abdomen with and without contrast on 02/21/2021 showed a mass in the posterior pancreatic head measuring 4.3 cm with obstructing of the central pancreatic duct and diffuse dilation up to 6 mm there was faint contrast-enhancing hyperintense masses of varying size throughout the liver the largest being 9.9 cm, mild dilation of the common bile duct measuring up to 8 mm and tapering to the ampulla was also identified.  She had trace bilateral effusions.  She has undergone upper endoscopy with Dr.  Silverio Decamp and this revealed excessive  bilious fluid in the entire stomach and a large infiltrative mass with bleeding at the second portion of the duodenum appearing to arise near the ampulla partially obstructing with friable mucosa, biopsies were obtained, and flexible sigmoidoscopy showed internal and external hemorrhoids as well as small mouth diverticula in the sigmoid colon.  No other abnormalities were identified.  Biopsies have come back showing adenocarcinoma of the duodenal lesion biopsied, Dr. Benay Spice. has evaluated the patient and plans to see her as an outpatient to discuss systemic therapy.  We have been asked to see the patient to consider palliative radiotherapy to the pancreas/duodenum.    PREVIOUS RADIATION THERAPY: No   PAST MEDICAL HISTORY:  Past Medical History:  Diagnosis Date  . Anemia   . Anxiety   . Arthritis   . Asthma   . Cancer ([place])    cervic  . H/O: hysterectomy   . High cholesterol   . Hypertension   . Hypothyroidism   . Insomnia   . Medical history non-contributory   . Migraine   . Morbid obesity ([place])   . Shortness of breath   . Stroke ([place])   . Thyroid disease        PAST SURGICAL HISTORY: Past Surgical History:  Procedure Laterality Date  . ABDOMINAL HYSTERECTOMY    . BIOPSY  02/19/2021   Procedure: BIOPSY;  Surgeon: Mauri Pole, MD;  Location: [facility];  Service: Endoscopy;;  . CESAREAN SECTION     3 occasions  . CHOLECYSTECTOMY    . ESOPHAGOGASTRODUODENOSCOPY (EGD) WITH PROPOFOL N/A 02/19/2021  Procedure: ESOPHAGOGASTRODUODENOSCOPY (EGD) WITH PROPOFOL;  Surgeon: Mauri Pole, MD;  Location: [facility];  Service: Endoscopy;  Laterality: N/A;  . FLEXIBLE SIGMOIDOSCOPY N/A 02/19/2021   Procedure: FLEXIBLE SIGMOIDOSCOPY;  Surgeon: Mauri Pole, MD;  Location: [facility];  Service: Endoscopy;  Laterality: N/A;  . THYROIDECTOMY, PARTIAL     Goiter     FAMILY HISTORY:  Family History  Problem Relation Age of Onset  . Migraines Sister       SOCIAL HISTORY:  reports that she has quit smoking. Her smoking use included cigarettes. She has never used smokeless tobacco. She reports that she does not drink alcohol and does not use drugs.  The patient is single and lives in [place].   ALLERGIES: Blueberry [vaccinium angustifolium], Mango flavor, Aspirin, and Penicillins   MEDICATIONS:  Current Facility-Administered Medications  Medication Dose Route Frequency Provider Last Rate Last Admin  . (feeding supplement) PROSource Plus liquid 30 mL  30 mL Oral TID BM Dahal, Binaya, MD   30 mL at 02/23/21 1330  . albuterol (PROVENTIL) (2.5 MG/3ML) 0.083% nebulizer solution 2.5 mg  2.5 mg Nebulization Q4H PRN Reubin Milan, MD      . alum & mag hydroxide-simeth (MAALOX/MYLANTA) 200-200-20 MG/5ML suspension 30 mL  30 mL Oral Q4H PRN Terrilee Croak, MD   30 mL at 02/21/21 1346  . feeding supplement (BOOST / RESOURCE BREEZE) liquid 1 Container  1 Container Oral TID BM Georgette Shell, MD   1 Container at 02/19/21 2014  . ferumoxytol (FERAHEME) 510 mg in sodium chloride 0.9 % 100 mL IVPB  510 mg Intravenous Weekly Terrilee Croak, MD   Stopped at 02/20/21 1854  . gabapentin (NEURONTIN) capsule 300 mg  300 mg Oral QHS Reubin Milan, MD   300 mg at 02/22/21 2222  . hydrocortisone cream 1 %   Topical TID PRN Terrilee Croak, MD   Given at 02/18/21 0308  . HYDROmorphone (DILAUDID) injection 2 mg  2 mg Intravenous Q2H PRN Terrilee Croak, MD   2 mg at 02/23/21 0928  . HYDROmorphone (DILAUDID) tablet 4 mg  4 mg Oral Q4H PRN Ladell Pier, MD   4 mg at 02/23/21 1330  . iohexol (OMNIPAQUE) 300 MG/ML solution 100 mL  100 mL Intravenous Once PRN Pfeiffer, Jeannie Done, MD      . metoCLOPramide (REGLAN) injection 5 mg  5 mg Intravenous Q6H Nandigam, Kavitha V, MD   5 mg at 02/19/21 1818  . mometasone-formoterol (DULERA) 200-5 MCG/ACT inhaler 2 puff  2 puff Inhalation BID Reubin Milan, MD   2 puff at 02/23/21 0753  . multivitamin with  minerals tablet 1 tablet  1 tablet Oral Daily Dahal, Binaya, MD   1 tablet at 02/18/21 1104  . ondansetron (ZOFRAN) tablet 4 mg  4 mg Oral Q6H PRN Reubin Milan, MD   4 mg at 02/20/21 2236   Or  . ondansetron [facility]) injection 4 mg  4 mg Intravenous Q6H PRN Reubin Milan, MD   4 mg at 02/21/21 1955  . pantoprazole (PROTONIX) injection 40 mg  40 mg Intravenous Q12H Reubin Milan, MD   40 mg at 02/23/21 9371  . potassium chloride SA (KLOR-CON) CR tablet 40 mEq  40 mEq Oral Once Dahal, Marlowe Aschoff, MD         REVIEW OF SYSTEMS: On review of systems, the patient reports that she is feeling much better now that her pain medication has been changed. She reports nausea is also  slightly improved. No other complaints are verbalized.    PHYSICAL EXAM:  Wt Readings from Last 3 Encounters:  02/16/21 258 lb 1.6 oz (117.1 kg)  10/08/20 275 lb (124.7 kg)  07/21/20 277 lb (125.6 kg)   Temp Readings from Last 3 Encounters:  02/23/21 98.2 F (36.8 C) (Oral)  10/08/20 99.1 F (37.3 C) (Oral)  07/21/20 98.3 F (36.8 C) (Oral)   BP Readings from Last 3 Encounters:  02/23/21 (!) 141/64  10/08/20 127/78  07/21/20 (!) 158/95   Pulse Readings from Last 3 Encounters:  02/23/21 85  10/08/20 (!) 110  07/21/20 90   Pain Assessment Pain Score: Asleep/10  Unable to assess due to encounter type.  ECOG = 2  0 - Asymptomatic (Fully active, able to carry on all predisease activities without restriction)  1 - Symptomatic but completely ambulatory (Restricted in physically strenuous activity but ambulatory and able to carry out work of a light or sedentary nature. For example, light housework, office work)  2 - Symptomatic, <50% in bed during the day (Ambulatory and capable of all self care but unable to carry out any work activities. Up and about more than 50% of waking hours)  3 - Symptomatic, >50% in bed, but not bedbound (Capable of only limited self-care, confined to bed or chair 50%  or more of waking hours)  4 - Bedbound (Completely disabled. Cannot carry on any self-care. Totally confined to bed or chair)  5 - Death   Eustace Pen MM, Creech RH, Tormey DC, et al. [phone]). "Toxicity and response criteria of the [facility]". [place] Oncol. 5 (6): 649-55    LABORATORY DATA:  Lab Results  Component Value Date   WBC 9.5 02/23/2021   HGB 7.5 (L) 02/23/2021   HCT 25.0 (L) 02/23/2021   MCV 78.9 (L) 02/23/2021   PLT 292 02/23/2021   Lab Results  Component Value Date   NA 139 02/23/2021   K 3.3 (L) 02/23/2021   CL 102 02/23/2021   CO2 32 02/23/2021   Lab Results  Component Value Date   ALT 20 02/17/2021   AST 27 02/17/2021   ALKPHOS 87 02/17/2021   BILITOT 1.0 02/17/2021      RADIOGRAPHY: CT ABDOMEN PELVIS WO CONTRAST  Result Date: 02/16/2021 CLINICAL DATA:  Abdominal distension. EXAM: CT ABDOMEN AND PELVIS WITHOUT CONTRAST TECHNIQUE: Multidetector CT imaging of the abdomen and pelvis was performed following the standard protocol without IV contrast. COMPARISON:  None recent FINDINGS: Lower chest: The lung bases are clear.There is a possible filling defect within the right ventricle (axial series 2, image 4). The intracardiac blood pool is hypodense relative to the adjacent myocardium consistent with anemia. Hepatobiliary: Multiple large hypoechoic masses are noted in the patient's liver. The largest is located in the left hepatic lobe measures approximately 10 cm. The gallbladder is not well evaluated.There is no biliary ductal dilation. Pancreas: There is an ill-defined masslike area in the pancreatic head measuring approximately 4.7 cm (axial series 2, image 41). Spleen: Unremarkable. Adrenals/Urinary Tract: --Adrenal glands: Unremarkable. --Right kidney/ureter: No hydronephrosis or radiopaque kidney stones. --Left kidney/ureter: There is a punctate nonobstructing stone in the upper pole the left kidney. --Urinary bladder: Unremarkable.  Stomach/Bowel: --Stomach/Duodenum: No hiatal hernia or other gastric abnormality. Normal duodenal course and caliber. --Small bowel: Unremarkable. --Colon: Unremarkable. --Appendix: Normal. Vascular/Lymphatic: Atherosclerotic calcification is present within the non-aneurysmal abdominal aorta, without hemodynamically significant stenosis. --No retroperitoneal lymphadenopathy. --No mesenteric lymphadenopathy. --No pelvic or inguinal lymphadenopathy. Reproductive: Status  post hysterectomy. No adnexal mass. Other: No ascites or free air. The abdominal wall is normal. Musculoskeletal. No acute displaced fractures. IMPRESSION: 1. Multiple large hypoechoic masses in the patient's liver, consistent with metastatic disease. These are amenable to percutaneous biopsy as clinically indicated. 2. Ill-defined masslike area in the pancreatic head measuring approximately 4.7 cm. This is concerning for pancreatic adenocarcinoma. Exam limited by lack of IV contrast. A follow-up contrast enhanced MRI would be useful for further evaluation and staging. 3. Possible filling defect within the right ventricle. Recommend further evaluation with a dedicated cardiac echo. 4. Anemia. 5. Punctate nonobstructing stone in the upper pole of the left kidney. Aortic Atherosclerosis (ICD10-I70.0). Electronically Signed   By: Constance Holster M.D.   On: 02/16/2021 16:04   MR ABDOMEN W WO CONTRAST  Result Date: 02/21/2021 CLINICAL DATA:  Pancreatic cancer, metastatic disease evaluation EXAM: MRI ABDOMEN WITHOUT AND WITH CONTRAST TECHNIQUE: Multiplanar multisequence MR imaging of the abdomen was performed both before and after the administration of intravenous contrast. CONTRAST:  6mL GADAVIST GADOBUTROL 1 MMOL/ML IV SOLN COMPARISON:  CT abdomen pelvis, 02/16/2021 FINDINGS: Lower chest: Trace bilateral pleural effusions. Hepatobiliary: Hepatomegaly, maximum coronal span 22.4 cm. There are faintly contrast enhancing, intrinsically T1 and T2  hyperintense masses of varying sizes throughout the liver, largest mass of the central left lobe measuring 9.9 x 9.2 cm (series 5, image 12). These are generally isosignal, isoenhancing to pancreatic head mass. Status post cholecystectomy. Mild dilatation of the common bile duct, measuring up to 8 mm and tapering to the ampulla. Pancreas: There is a mass of the posterior pancreatic head measuring 4.3 x 3.8 cm with obstruction of the central pancreatic duct and diffuse dilatation up to 6 mm. Spleen:  Within normal limits in size and appearance. Adrenals/Urinary Tract: No masses identified. No evidence of hydronephrosis. Stomach/Bowel: Visualized portions within the abdomen are unremarkable. Vascular/Lymphatic: No pathologically enlarged lymph nodes identified. No abdominal aortic aneurysm demonstrated. Left portal vein appears to be partially effaced by liver masses. The central portal vein is patent. Other:  None. Musculoskeletal: No suspicious bone lesions identified. IMPRESSION: 1. There is a mass of the posterior pancreatic head measuring 4.3 x 3.8 cm with obstruction of the central pancreatic duct and diffuse dilatation up to 6 mm. This is consistent with pancreatic adenocarcinoma. 2. There are faintly contrast enhancing, intrinsically T1 and T2 hyperintense masses of varying sizes throughout the liver, largest mass of the central left lobe measuring 9.9 x 9.2 cm. These are consistent with hepatic metastatic disease. 3. Mild dilatation of the common bile duct, measuring up to 8 mm and tapering to the ampulla. This is likely related to prior cholecystectomy without evidence of overt common bile duct obstruction. 4. Trace bilateral pleural effusions. 5. Hepatomegaly. Electronically Signed   By: Eddie Candle M.D.   On: 02/21/2021 13:31   MR 3D Recon At Scanner  Result Date: 02/21/2021 CLINICAL DATA:  Pancreatic cancer, metastatic disease evaluation EXAM: MRI ABDOMEN WITHOUT AND WITH CONTRAST TECHNIQUE:  Multiplanar multisequence MR imaging of the abdomen was performed both before and after the administration of intravenous contrast. CONTRAST:  26mL GADAVIST GADOBUTROL 1 MMOL/ML IV SOLN COMPARISON:  CT abdomen pelvis, 02/16/2021 FINDINGS: Lower chest: Trace bilateral pleural effusions. Hepatobiliary: Hepatomegaly, maximum coronal span 22.4 cm. There are faintly contrast enhancing, intrinsically T1 and T2 hyperintense masses of varying sizes throughout the liver, largest mass of the central left lobe measuring 9.9 x 9.2 cm (series 5, image 12). These are generally isosignal, isoenhancing to  pancreatic head mass. Status post cholecystectomy. Mild dilatation of the common bile duct, measuring up to 8 mm and tapering to the ampulla. Pancreas: There is a mass of the posterior pancreatic head measuring 4.3 x 3.8 cm with obstruction of the central pancreatic duct and diffuse dilatation up to 6 mm. Spleen:  Within normal limits in size and appearance. Adrenals/Urinary Tract: No masses identified. No evidence of hydronephrosis. Stomach/Bowel: Visualized portions within the abdomen are unremarkable. Vascular/Lymphatic: No pathologically enlarged lymph nodes identified. No abdominal aortic aneurysm demonstrated. Left portal vein appears to be partially effaced by liver masses. The central portal vein is patent. Other:  None. Musculoskeletal: No suspicious bone lesions identified. IMPRESSION: 1. There is a mass of the posterior pancreatic head measuring 4.3 x 3.8 cm with obstruction of the central pancreatic duct and diffuse dilatation up to 6 mm. This is consistent with pancreatic adenocarcinoma. 2. There are faintly contrast enhancing, intrinsically T1 and T2 hyperintense masses of varying sizes throughout the liver, largest mass of the central left lobe measuring 9.9 x 9.2 cm. These are consistent with hepatic metastatic disease. 3. Mild dilatation of the common bile duct, measuring up to 8 mm and tapering to the ampulla.  This is likely related to prior cholecystectomy without evidence of overt common bile duct obstruction. 4. Trace bilateral pleural effusions. 5. Hepatomegaly. Electronically Signed   By: Eddie Candle M.D.   On: 02/21/2021 13:31   MR CARDIAC MORPHOLOGY W WO CONTRAST  Result Date: 02/21/2021 CLINICAL DATA:  RV filling defect on CT EXAM: CARDIAC MRI TECHNIQUE: The patient was scanned on a 1.5 Tesla Siemens magnet. A dedicated cardiac coil was used. Functional imaging was done using [employer] sequences. 2,3, and 4 chamber views were done to assess for RWMA's. Modified Simpson's rule using a short axis stack was used to calculate an ejection fraction on a dedicated work Conservation officer, nature. The patient received 10 cc of Gadavist. After 10 minutes inversion recovery sequences were used to assess for infiltration and scar tissue. CONTRAST:  10 cc  of Gadavist FINDINGS: Left ventricle: -Normal size -Normal systolic function -Elevated ECV (36%) -No LGE LV EF:  53% (Normal 56-78%) Absolute volumes: LV EDV: 112mL (Normal 52-141 mL) LV ESV: 73mL (Normal 13-51 mL) LV SV: 25mL (Normal 33-97 mL) CO: 7.4L/min (Normal 2.7-6.0 L/min) Indexed volumes: LV EDV: 71mL/sq-m (Normal 41-81 mL/sq-m) LV ESV: 87mL/sq-m (Normal 12-21 mL/sq-m) LV SV: 80mL/sq-m (Normal 26-56 mL/sq-m) CI: 3.2L/min/sq-m (Normal 1.8-3.8 L/min/sq-m) Right ventricle: -Normal size and systolic function -Mass in mid RV attached to interventricular septum measuring 22mm x 73mm. Mass is heterogenous, with central area of low T1/high T2 and peripheral area of high T1. Dark on EGE but heterogenous area of enhancement on LGE RV EF: 59% (Normal 47-80%) Absolute volumes: RV EDV: 187mL (Normal 58-154 mL) RV ESV: 1mL (Normal 12-68 mL) RV SV: 72mL (Normal 35-98 mL) CO: 7.5L/min (Normal 2.7-6 L/min) Indexed volumes: RV EDV: 69mL/sq-m (Normal 48-87 mL/sq-m) RV ESV: 23mL/sq-m (Normal 11-28 mL/sq-m) RV SV: 64mL/sq-m (Normal 27-57 mL/sq-m) CI: 3.2L/min/sq-m (Normal 1.8-3.8  L/min/sq-m) Left atrium: Mild enlargement Right atrium: Normal size Mitral valve: No regurgitation Aortic valve: No regurgitation Tricuspid valve: No regurgitation Pulmonic valve: No regurgitation Aorta: Normal proximal ascending aorta Pericardium: Normal IMPRESSION: 1. Mass in mid RV attached to interventricular septum measures 42mm x 54mm. Mass appears heterogenous, with area of enhancement on LGE imaging. Given patient with suspected malignancy, mass is concerning for metastatic tumor 2. Normal LV size and systolic function (EF 67%). No  late gadolinium enhancement to suggest myocardial scar 3.  Normal RV size and systolic function (EF 62%) Electronically Signed   By: Oswaldo Milian MD   On: 02/21/2021 01:00   ECHOCARDIOGRAM COMPLETE  Result Date: 02/17/2021    ECHOCARDIOGRAM REPORT   Patient Name:   Wendy Hoffman Date of Exam: 02/17/2021 Medical Rec #:  [id number]       Height:       64.0 in Accession #:    [id number]      Weight:       258.1 lb Date of Birth:  20-Mar-1966       BSA:          2.180 m Patient Age:    41 years        BP:           117/77 mmHg Patient Gender: F               HR:           69 bpm. Exam Location:  Inpatient Procedure: 2D Echo, 3D Echo, Cardiac Doppler and Color Doppler Indications:    R94.31 Abnormal EKG  History:        Patient has prior history of Echocardiogram examinations, most                 recent 04/18/2008. COPD, Signs/Symptoms:Chest Pain; Risk                 Factors:Hypertension, Dyslipidemia and Current Smoker.  Sonographer:    Roseanna Rainbow RDCS Referring Phys: [id number] [facility]  Sonographer Comments: Technically difficult study due to poor echo windows and patient is morbidly obese. Image acquisition challenging due to patient body habitus. IMPRESSIONS  1. Left ventricular ejection fraction, by estimation, is 55 to 60%. The left ventricle has normal function. The left ventricle has no regional wall motion abnormalities. Left ventricular diastolic parameters were  normal.  2. Right ventricular systolic function is normal. The right ventricular size is normal.  3. The mitral valve is normal in structure. No evidence of mitral valve regurgitation.  4. The aortic valve is normal in structure. Aortic valve regurgitation is not visualized. FINDINGS  Left Ventricle: Left ventricular ejection fraction, by estimation, is 55 to 60%. The left ventricle has normal function. The left ventricle has no regional wall motion abnormalities. The left ventricular internal cavity size was normal in size. There is  no left ventricular hypertrophy. Left ventricular diastolic parameters were normal. Right Ventricle: The right ventricular size is normal. No increase in right ventricular wall thickness. Right ventricular systolic function is normal. Left Atrium: Left atrial size was normal in size. Right Atrium: Right atrial size was normal in size. Pericardium: There is no evidence of pericardial effusion. Mitral Valve: The mitral valve is normal in structure. No evidence of mitral valve regurgitation. Tricuspid Valve: The tricuspid valve is grossly normal. Tricuspid valve regurgitation is trivial. Aortic Valve: The aortic valve is normal in structure. Aortic valve regurgitation is not visualized. Pulmonic Valve: The pulmonic valve was grossly normal. Pulmonic valve regurgitation is not visualized. Aorta: The aortic root and ascending aorta are structurally normal, with no evidence of dilitation. IAS/Shunts: The atrial septum is grossly normal.  LEFT VENTRICLE PLAX 2D LVIDd:         4.70 cm      Diastology LVIDs:         3.00 cm      LV e' medial:    6.85 cm/s LV PW:  1.30 cm      LV E/e' medial:  13.7 LV IVS:        1.20 cm      LV e' lateral:   9.79 cm/s LVOT diam:     2.00 cm      LV E/e' lateral: 9.6 LV SV:         88 LV SV Index:   40 LVOT Area:     3.14 cm  LV Volumes (MOD) LV vol d, MOD A2C: 97.7 ml LV vol d, MOD A4C: 146.0 ml LV vol s, MOD A2C: 42.0 ml LV vol s, MOD A4C: 46.4 ml LV  SV MOD A2C:     55.7 ml LV SV MOD A4C:     146.0 ml LV SV MOD BP:      76.1 ml RIGHT VENTRICLE             IVC RV S prime:     12.80 cm/s  IVC diam: 1.50 cm TAPSE (M-mode): 2.1 cm LEFT ATRIUM             Index       RIGHT ATRIUM           Index LA diam:        3.30 cm 1.51 cm/m  RA Area:     12.00 cm LA Vol (A2C):   46.6 ml 21.37 ml/m RA Volume:   21.60 ml  9.91 ml/m LA Vol (A4C):   29.6 ml 13.58 ml/m LA Biplane Vol: 37.7 ml 17.29 ml/m  AORTIC VALVE LVOT Vmax:   131.00 cm/s LVOT Vmean:  91.900 cm/s LVOT VTI:    0.279 m  AORTA Ao Root diam: 3.70 cm MITRAL VALVE MV Area (PHT): 4.06 cm    SHUNTS MV Decel Time: 187 msec    Systemic VTI:  0.28 m MV E velocity: 93.60 cm/s  Systemic Diam: 2.00 cm MV A velocity: 84.40 cm/s MV E/A ratio:  1.11 Mertie Moores MD Electronically signed by Mertie Moores MD Signature Date/Time: 02/17/2021/3:41:34 PM    Final        IMPRESSION/PLAN: 1. 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 Dr. Lisbeth Renshaw has reviewed the patient's case and recommends proceeding with palliative treatment to the primary tumor site to try to improve pain, nausea and vomiting with the hopes of improving quality of life. We discussed the risks, benefits, short, and long term effects of radiotherapy, as well as the palliative intent, and the patient is interested in proceeding. Dr. Lisbeth Renshaw discusses the delivery and logistics of radiotherapy and anticipates a course of 1-2 weeks of radiotherapy. She will simulate tomorrow morning at 8 am and will sign consent to proceed. We expect that she would begin treatment on Wednesday this week.   In a visit lasting 45 minutes, greater than 50% of the time was spent by phone discussing the patient's condition, in preparation for the discussion, and coordinating the patient's care.     Carola Rhine, [facility]   **Disclaimer: This note was dictated with voice recognition software. Similar sounding words can inadvertently be transcribed and this  note may contain transcription errors which may not have been corrected upon publication of note.**

## 2021-02-23 NOTE — Plan of Care (Signed)
  Problem: Education: Goal: Knowledge of General Education information will improve Description: Including pain rating scale, medication(s)/side effects and non-pharmacologic comfort measures Outcome: Progressing   Problem: Health Behavior/Discharge Planning: Goal: Ability to manage health-related needs will improve Outcome: Progressing   Problem: Clinical Measurements: Goal: Ability to maintain clinical measurements within normal limits will improve Outcome: Progressing Goal: Will remain free from infection Outcome: Progressing Goal: Diagnostic test results will improve Outcome: Progressing Goal: Respiratory complications will improve Outcome: Progressing Goal: Cardiovascular complication will be avoided Outcome: Progressing   Problem: Coping: Goal: Level of anxiety will decrease Outcome: Progressing   Problem: Elimination: Goal: Will not experience complications related to bowel motility Outcome: Progressing Goal: Will not experience complications related to urinary retention Outcome: Progressing   Problem: Safety: Goal: Ability to remain free from injury will improve Outcome: Progressing   Problem: Skin Integrity: Goal: Risk for impaired skin integrity will decrease Outcome: Progressing   Problem: Education: Goal: Ability to identify signs and symptoms of gastrointestinal bleeding will improve Outcome: Progressing

## 2021-02-23 NOTE — Progress Notes (Signed)
Patient is requesting a sleep aid. PCP was notified,awaiting new orders.

## 2021-02-23 NOTE — Progress Notes (Signed)
[facility]  Pathology results paged to Dr. Silverio Decamp.  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 Please relay to the patient.  We will sign off.  Please call if you have any further questions.  Ellouise Newer, PA-C

## 2021-02-23 NOTE — Progress Notes (Signed)
PROGRESS NOTE  Wendy Hoffman  DOB: August 16, 1966  PCP: Patient, No Pcp Per DJS:[id number]  DOA: 02/16/2021  LOS: 7 days   Chief Complaint  Patient presents with  . Abdominal Pain  . Emesis   Brief narrative: Wendy Hoffman is a 55 y.o. female with PMH significant for morbid obesity, HTN, HLD, cervical cancer/hysterectomy, chronic iron deficiency anemia, anxiety, osteoarthritis, asthma/COPD, hypothyroidism, insomnia, migraine headaches.  Patient presented to the ED on 3/7 with nausea, vomiting, abdominal pain, bloating and 38 pound weight loss in 2 months. In the ED, fecal occult blood test was positive, hemoglobin low at 5.8. CT abdomen/pelvis without contrast showed multiple large hypoechoic masses in the patient's liver consistent with metastatic disease. There is an ill-defined masslike area in the pancreatic head measuring about 4.7 cm.  Admitted to hospitalist service. See below for details.  Subjective: Patient was seen and examined this morning.   Sitting up in bed.  Able to tolerate small amount of soft diet only. Planned for liver lesion biopsy today.  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 -3/7 CT scanshowed pancreatic head mass of 4.7 cm with multiple liver mets -3/10 EGD 3/10 showed large infiltrative mass with bleeding in the second portion of the duodenum, appears to be arising near the ampulla, partially obstructive with friable mucosa.  Pending biopsy report. -MRI 3/12 confirmed the finding and also showed obstruction of the central pancreatic duct with diffuse dilation up to 6 mm consistent with pancreatic adenocarcinoma.  Also confirmed hepatic mets.  There is mild dilation of the CBD without obstruction which probably is related to prior cholecystectomy status. -IR is planning for biopsy of the liver mass today.  I called radiation oncology consultation. -GI and oncology consultation  appreciated. -Able to tolerate soft diet.  No evidence of gastric outlet obstruction so far.  Acute on chronic GI bleeding  Iron deficiency anemia -Hemoglobin on admission was low at 5 on admission. -3 units of PRBC transfused so far.  Hemoglobin trend as below, 7.5 today.   -Transfuse to keep hemoglobin more than 7. -IV Feraheme given 3/11. Recent Labs    02/16/21 2252 02/17/21 0845 02/18/21 0933 02/19/21 0431 02/20/21 0450 02/21/21 0323 02/23/21 0350  HGB 5.0*   < > 7.4* 7.8* 6.8* 7.2* 7.5*  MCV  --    < > 77.2* 78.7* 77.4* 79.6* 78.9*  VITAMINB12 330  --   --   --   --   --   --   FOLATE 12.0  --   --   --   --   --   --   FERRITIN 7*  --   --   --   --   --   --   TIBC 400  --   --   --   --   --   --   IRON 16*  --   --   --   --   --   --   RETICCTPCT 1.7  --   --   --   --   --   --    < > = values in this interval not displayed.   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 -I discussed with cardiologist Dr. Acie Fredrickson and Dr. Reather Littler.  Underwent cardiac MRI on 3/11.  MRI showed 16 mm x 6 mm mass in the RV attached to the interventricular septum, concerning for metastatic tumor.   Hypertension -  Amlodipine on hold.  Blood pressure stable.  Continue to monitor  High cholesterol/Aortic atherosclerosis -Continue rosuvastatin.  Hypothyroidism -Continue Synthroid  Morbid obesity  -Body mass index is 44.3 kg/m. Patient has been advised to make an attempt to improve diet and exercise patterns to aid in weight loss.  COPD -Supplemental oxygen as needed. -Bronchodilators as needed.  Mobility: Encourage ambulation Code Status:   Code Status: Full Code  Nutritional status: Body mass index is 44.3 kg/m. Nutrition Problem: Inadequate protein intake Etiology: other (see comment) (current diet order) Signs/Symptoms: other (comment) (CLD does not meet estimated protein need) Diet Order            DIET SOFT Room service appropriate? Yes;  Fluid consistency: Thin  Diet effective now                 DVT prophylaxis: SCDs Start: 02/16/21 1723   Antimicrobials:  None Fluid: None IV fluid Consultants: GI, oncology, IR, radiation oncology Family Communication:  None at bedside  Status is: Inpatient  Remains inpatient appropriate because: Pending liver biopsy probably on Monday  Dispo: The patient is from: Home              Anticipated d/c is to: Home in 2 to 3 days              Patient currently is not medically stable to d/c.   Difficult to place patient No       Infusions:  . ferumoxytol Stopped (02/20/21 1854)    Scheduled Meds: . (feeding supplement) PROSource Plus  30 mL Oral TID BM  . feeding supplement  1 Container Oral TID BM  . gabapentin  300 mg Oral QHS  . metoCLOPramide (REGLAN) injection  5 mg Intravenous Q6H  . mometasone-formoterol  2 puff Inhalation BID  . multivitamin with minerals  1 tablet Oral Daily  . pantoprazole (PROTONIX) IV  40 mg Intravenous Q12H  . potassium chloride  40 mEq Oral Once    Antimicrobials: Anti-infectives (From admission, onward)   None      PRN meds: albuterol, alum & mag hydroxide-simeth, hydrocortisone cream, HYDROmorphone (DILAUDID) injection, HYDROmorphone, iohexol, ondansetron **OR** ondansetron (ZOFRAN) IV   Objective: Vitals:   02/23/21 0547 02/23/21 0753  BP: 110/72   Pulse: 84   Resp: 20   Temp: 98.5 F (36.9 C)   SpO2: 94% 100%    Intake/Output Summary (Last 24 hours) at 02/23/2021 1200 Last data filed at 02/22/2021 1604 Gross per 24 hour  Intake 360 ml  Output 400 ml  Net -40 ml   Filed Weights   02/16/21 1214 02/16/21 1913  Weight: 118.4 kg 117.1 kg   Weight change:  Body mass index is 44.3 kg/m.   Physical Exam: General exam: Pleasant, middle-aged African-American female.  Intermittent partially controlled pain Skin: No rashes, lesions or ulcers. HEENT: Atraumatic, normocephalic, no obvious bleeding Lungs: Clear to  auscultation bilaterally CVS: Regular rate and rhythm, no murmur GI/Abd soft, epigastric tenderness present, nondistended, bowel sound present CNS: Alert, awake, oriented x3. Psychiatry: Depressed look Extremities: No pedal edema, no calf tenderness  Data Review: I have personally reviewed the laboratory data and studies available.  Recent Labs  Lab 02/18/21 0933 02/19/21 0431 02/20/21 0450 02/21/21 0323 02/23/21 0350  WBC 12.7* 11.1* 10.0 9.1 9.5  NEUTROABS 9.8* 8.0* 7.0 6.5 6.4  HGB 7.4* 7.8* 6.8* 7.2* 7.5*  HCT 25.0* 27.0* 22.6* 23.8* 25.0*  MCV 77.2* 78.7* 77.4* 79.6* 78.9*  PLT 364 356 286 277  292   Recent Labs  Lab 02/16/21 2252 02/17/21 0845 02/18/21 0933 02/19/21 0431 02/20/21 0450 02/21/21 0323 02/23/21 0350  NA  --    < > 138 138 140 140 139  K  --    < > 3.8 3.4* 3.4* 3.0* 3.3*  CL  --    < > 103 103 104 105 102  CO2  --    < > 24 23 26 25  32  GLUCOSE  --    < > 99 110* 100* 107* 94  BUN  --    < > 15 13 9 9 6   CREATININE  --    < > 0.94 0.87 0.67 0.65 0.54  CALCIUM  --    < > 9.4 9.4 9.1 9.0 9.1  MG 2.1  --   --   --   --   --   --   PHOS 3.7  --   --   --   --   --   --    < > = values in this interval not displayed.    F/u labs ordered Unresulted Labs (From admission, onward)          Start     Ordered   02/24/21 0500  CBC with Differential/Platelet  Daily,   R     Question:  Specimen collection method  Answer:  Lab=Lab collect   02/23/21 1200   02/24/21 7096  Basic metabolic panel  Daily,   R     Question:  Specimen collection method  Answer:  Lab=Lab collect   02/23/21 1200   02/23/21 0749  CEA  Add-on,   AD       Question:  Specimen collection method  Answer:  Lab=Lab collect   02/23/21 0748   02/23/21 0749  Cancer antigen 19-9  Add-on,   AD       Question:  Specimen collection method  Answer:  Lab=Lab collect   02/23/21 0748          Signed, Terrilee Croak, MD [facility] 02/23/2021

## 2021-02-23 NOTE — Progress Notes (Addendum)
HEMATOLOGY-ONCOLOGY PROGRESS NOTE  SUBJECTIVE: Continues to have pain in the epigastric area.  Tolerates only a small amount of soft food like applesauce but otherwise develops nausea and vomiting.  PHYSICAL EXAMINATION:  Vitals:   02/22/21 2021 02/23/21 0547  BP: (!) 150/93 110/72  Pulse: 87 84  Resp:  20  Temp:  98.5 F (36.9 C)  SpO2:  94%   Filed Weights   02/16/21 1214 02/16/21 1913  Weight: 118.4 kg 117.1 kg    Intake/Output from previous day: 03/13 0701 - 03/14 0700 In: 600 [P.O.:600] Out: 1000 [Urine:1000]  GENERAL:alert, no distress and comfortable SKIN: skin color, texture, turgor are normal, no rashes or significant lesions LUNGS: clear to auscultation and percussion with normal breathing effort HEART: regular rate & rhythm and no murmurs and no lower extremity edema ABDOMEN: Positive bowel sounds, soft, tenderness over the epigastric area NEURO: alert & oriented x 3 with fluent speech, no focal motor/sensory deficits  LABORATORY DATA:  I have reviewed the data as listed CMP Latest Ref Rng & Units 02/23/2021 02/21/2021 02/20/2021  Glucose 70 - 99 mg/dL 94 107(H) 100(H)  BUN 6 - 20 mg/dL 6 9 9   Creatinine 0.44 - 1.00 mg/dL 0.54 0.65 0.67  Sodium 135 - 145 mmol/L 139 140 140  Potassium 3.5 - 5.1 mmol/L 3.3(L) 3.0(L) 3.4(L)  Chloride 98 - 111 mmol/L 102 105 104  CO2 22 - 32 mmol/L 32 25 26  Calcium 8.9 - 10.3 mg/dL 9.1 9.0 9.1  Total Protein 6.5 - 8.1 g/dL - - -  Total Bilirubin 0.3 - 1.2 mg/dL - - -  Alkaline Phos 38 - 126 U/L - - -  AST 15 - 41 U/L - - -  ALT 0 - 44 U/L - - -    Lab Results  Component Value Date   WBC 9.5 02/23/2021   HGB 7.5 (L) 02/23/2021   HCT 25.0 (L) 02/23/2021   MCV 78.9 (L) 02/23/2021   PLT 292 02/23/2021   NEUTROABS 6.4 02/23/2021    CT ABDOMEN PELVIS WO CONTRAST  Result Date: 02/16/2021 CLINICAL DATA:  Abdominal distension. EXAM: CT ABDOMEN AND PELVIS WITHOUT CONTRAST TECHNIQUE: Multidetector CT imaging of the abdomen and  pelvis was performed following the standard protocol without IV contrast. COMPARISON:  None recent FINDINGS: Lower chest: The lung bases are clear.There is a possible filling defect within the right ventricle (axial series 2, image 4). The intracardiac blood pool is hypodense relative to the adjacent myocardium consistent with anemia. Hepatobiliary: Multiple large hypoechoic masses are noted in the patient's liver. The largest is located in the left hepatic lobe measures approximately 10 cm. The gallbladder is not well evaluated.There is no biliary ductal dilation. Pancreas: There is an ill-defined masslike area in the pancreatic head measuring approximately 4.7 cm (axial series 2, image 41). Spleen: Unremarkable. Adrenals/Urinary Tract: --Adrenal glands: Unremarkable. --Right kidney/ureter: No hydronephrosis or radiopaque kidney stones. --Left kidney/ureter: There is a punctate nonobstructing stone in the upper pole the left kidney. --Urinary bladder: Unremarkable. Stomach/Bowel: --Stomach/Duodenum: No hiatal hernia or other gastric abnormality. Normal duodenal course and caliber. --Small bowel: Unremarkable. --Colon: Unremarkable. --Appendix: Normal. Vascular/Lymphatic: Atherosclerotic calcification is present within the non-aneurysmal abdominal aorta, without hemodynamically significant stenosis. --No retroperitoneal lymphadenopathy. --No mesenteric lymphadenopathy. --No pelvic or inguinal lymphadenopathy. Reproductive: Status post hysterectomy. No adnexal mass. Other: No ascites or free air. The abdominal wall is normal. Musculoskeletal. No acute displaced fractures. IMPRESSION: 1. Multiple large hypoechoic masses in the patient's liver, consistent with metastatic disease. These  are amenable to percutaneous biopsy as clinically indicated. 2. Ill-defined masslike area in the pancreatic head measuring approximately 4.7 cm. This is concerning for pancreatic adenocarcinoma. Exam limited by lack of IV contrast. A  follow-up contrast enhanced MRI would be useful for further evaluation and staging. 3. Possible filling defect within the right ventricle. Recommend further evaluation with a dedicated cardiac echo. 4. Anemia. 5. Punctate nonobstructing stone in the upper pole of the left kidney. Aortic Atherosclerosis (ICD10-I70.0). Electronically Signed   By: Constance Holster M.D.   On: 02/16/2021 16:04   MR ABDOMEN W WO CONTRAST  Result Date: 02/21/2021 CLINICAL DATA:  Pancreatic cancer, metastatic disease evaluation EXAM: MRI ABDOMEN WITHOUT AND WITH CONTRAST TECHNIQUE: Multiplanar multisequence MR imaging of the abdomen was performed both before and after the administration of intravenous contrast. CONTRAST:  2mL GADAVIST GADOBUTROL 1 MMOL/ML IV SOLN COMPARISON:  CT abdomen pelvis, 02/16/2021 FINDINGS: Lower chest: Trace bilateral pleural effusions. Hepatobiliary: Hepatomegaly, maximum coronal span 22.4 cm. There are faintly contrast enhancing, intrinsically T1 and T2 hyperintense masses of varying sizes throughout the liver, largest mass of the central left lobe measuring 9.9 x 9.2 cm (series 5, image 12). These are generally isosignal, isoenhancing to pancreatic head mass. Status post cholecystectomy. Mild dilatation of the common bile duct, measuring up to 8 mm and tapering to the ampulla. Pancreas: There is a mass of the posterior pancreatic head measuring 4.3 x 3.8 cm with obstruction of the central pancreatic duct and diffuse dilatation up to 6 mm. Spleen:  Within normal limits in size and appearance. Adrenals/Urinary Tract: No masses identified. No evidence of hydronephrosis. Stomach/Bowel: Visualized portions within the abdomen are unremarkable. Vascular/Lymphatic: No pathologically enlarged lymph nodes identified. No abdominal aortic aneurysm demonstrated. Left portal vein appears to be partially effaced by liver masses. The central portal vein is patent. Other:  None. Musculoskeletal: No suspicious bone lesions  identified. IMPRESSION: 1. There is a mass of the posterior pancreatic head measuring 4.3 x 3.8 cm with obstruction of the central pancreatic duct and diffuse dilatation up to 6 mm. This is consistent with pancreatic adenocarcinoma. 2. There are faintly contrast enhancing, intrinsically T1 and T2 hyperintense masses of varying sizes throughout the liver, largest mass of the central left lobe measuring 9.9 x 9.2 cm. These are consistent with hepatic metastatic disease. 3. Mild dilatation of the common bile duct, measuring up to 8 mm and tapering to the ampulla. This is likely related to prior cholecystectomy without evidence of overt common bile duct obstruction. 4. Trace bilateral pleural effusions. 5. Hepatomegaly. Electronically Signed   By: Eddie Candle M.D.   On: 02/21/2021 13:31   MR 3D Recon At Scanner  Result Date: 02/21/2021 CLINICAL DATA:  Pancreatic cancer, metastatic disease evaluation EXAM: MRI ABDOMEN WITHOUT AND WITH CONTRAST TECHNIQUE: Multiplanar multisequence MR imaging of the abdomen was performed both before and after the administration of intravenous contrast. CONTRAST:  84mL GADAVIST GADOBUTROL 1 MMOL/ML IV SOLN COMPARISON:  CT abdomen pelvis, 02/16/2021 FINDINGS: Lower chest: Trace bilateral pleural effusions. Hepatobiliary: Hepatomegaly, maximum coronal span 22.4 cm. There are faintly contrast enhancing, intrinsically T1 and T2 hyperintense masses of varying sizes throughout the liver, largest mass of the central left lobe measuring 9.9 x 9.2 cm (series 5, image 12). These are generally isosignal, isoenhancing to pancreatic head mass. Status post cholecystectomy. Mild dilatation of the common bile duct, measuring up to 8 mm and tapering to the ampulla. Pancreas: There is a mass of the posterior pancreatic head measuring 4.3 x  3.8 cm with obstruction of the central pancreatic duct and diffuse dilatation up to 6 mm. Spleen:  Within normal limits in size and appearance. Adrenals/Urinary  Tract: No masses identified. No evidence of hydronephrosis. Stomach/Bowel: Visualized portions within the abdomen are unremarkable. Vascular/Lymphatic: No pathologically enlarged lymph nodes identified. No abdominal aortic aneurysm demonstrated. Left portal vein appears to be partially effaced by liver masses. The central portal vein is patent. Other:  None. Musculoskeletal: No suspicious bone lesions identified. IMPRESSION: 1. There is a mass of the posterior pancreatic head measuring 4.3 x 3.8 cm with obstruction of the central pancreatic duct and diffuse dilatation up to 6 mm. This is consistent with pancreatic adenocarcinoma. 2. There are faintly contrast enhancing, intrinsically T1 and T2 hyperintense masses of varying sizes throughout the liver, largest mass of the central left lobe measuring 9.9 x 9.2 cm. These are consistent with hepatic metastatic disease. 3. Mild dilatation of the common bile duct, measuring up to 8 mm and tapering to the ampulla. This is likely related to prior cholecystectomy without evidence of overt common bile duct obstruction. 4. Trace bilateral pleural effusions. 5. Hepatomegaly. Electronically Signed   By: Eddie Candle M.D.   On: 02/21/2021 13:31   MR CARDIAC MORPHOLOGY W WO CONTRAST  Result Date: 02/21/2021 CLINICAL DATA:  RV filling defect on CT EXAM: CARDIAC MRI TECHNIQUE: The patient was scanned on a 1.5 Tesla Siemens magnet. A dedicated cardiac coil was used. Functional imaging was done using Fiesta sequences. 2,3, and 4 chamber views were done to assess for RWMA's. Modified Simpson's rule using a short axis stack was used to calculate an ejection fraction on a dedicated work Conservation officer, nature. The patient received 10 cc of Gadavist. After 10 minutes inversion recovery sequences were used to assess for infiltration and scar tissue. CONTRAST:  10 cc  of Gadavist FINDINGS: Left ventricle: -Normal size -Normal systolic function -Elevated ECV (36%) -No LGE LV EF:   53% (Normal 56-78%) Absolute volumes: LV EDV: 153mL (Normal 52-141 mL) LV ESV: 67mL (Normal 13-51 mL) LV SV: 44mL (Normal 33-97 mL) CO: 7.4L/min (Normal 2.7-6.0 L/min) Indexed volumes: LV EDV: 42mL/sq-m (Normal 41-81 mL/sq-m) LV ESV: 100mL/sq-m (Normal 12-21 mL/sq-m) LV SV: 82mL/sq-m (Normal 26-56 mL/sq-m) CI: 3.2L/min/sq-m (Normal 1.8-3.8 L/min/sq-m) Right ventricle: -Normal size and systolic function -Mass in mid RV attached to interventricular septum measuring 67mm x 23mm. Mass is heterogenous, with central area of low T1/high T2 and peripheral area of high T1. Dark on EGE but heterogenous area of enhancement on LGE RV EF: 59% (Normal 47-80%) Absolute volumes: RV EDV: 129mL (Normal 58-154 mL) RV ESV: 28mL (Normal 12-68 mL) RV SV: 37mL (Normal 35-98 mL) CO: 7.5L/min (Normal 2.7-6 L/min) Indexed volumes: RV EDV: 5mL/sq-m (Normal 48-87 mL/sq-m) RV ESV: 90mL/sq-m (Normal 11-28 mL/sq-m) RV SV: 12mL/sq-m (Normal 27-57 mL/sq-m) CI: 3.2L/min/sq-m (Normal 1.8-3.8 L/min/sq-m) Left atrium: Mild enlargement Right atrium: Normal size Mitral valve: No regurgitation Aortic valve: No regurgitation Tricuspid valve: No regurgitation Pulmonic valve: No regurgitation Aorta: Normal proximal ascending aorta Pericardium: Normal IMPRESSION: 1. Mass in mid RV attached to interventricular septum measures 40mm x 41mm. Mass appears heterogenous, with area of enhancement on LGE imaging. Given patient with suspected malignancy, mass is concerning for metastatic tumor 2. Normal LV size and systolic function (EF 35%). No late gadolinium enhancement to suggest myocardial scar 3.  Normal RV size and systolic function (EF 36%) Electronically Signed   By: Oswaldo Milian MD   On: 02/21/2021 01:00   ECHOCARDIOGRAM COMPLETE  Result Date: 02/17/2021    ECHOCARDIOGRAM REPORT   Patient Name:   Wendy Hoffman Date of Exam: 02/17/2021 Medical Rec #:  [id number]       Height:       64.0 in Accession #:    [id number]      Weight:       258.1 lb Date of  Birth:  09/12/1966       BSA:          2.180 m Patient Age:    55 years        BP:           117/77 mmHg Patient Gender: F               HR:           69 bpm. Exam Location:  Inpatient Procedure: 2D Echo, 3D Echo, Cardiac Doppler and Color Doppler Indications:    R94.31 Abnormal EKG  History:        Patient has prior history of Echocardiogram examinations, most                 recent 04/18/2008. COPD, Signs/Symptoms:Chest Pain; Risk                 Factors:Hypertension, Dyslipidemia and Current Smoker.  Sonographer:    Roseanna Rainbow RDCS Referring Phys: [id number] [facility]  Sonographer Comments: Technically difficult study due to poor echo windows and patient is morbidly obese. Image acquisition challenging due to patient body habitus. IMPRESSIONS  1. Left ventricular ejection fraction, by estimation, is 55 to 60%. The left ventricle has normal function. The left ventricle has no regional wall motion abnormalities. Left ventricular diastolic parameters were normal.  2. Right ventricular systolic function is normal. The right ventricular size is normal.  3. The mitral valve is normal in structure. No evidence of mitral valve regurgitation.  4. The aortic valve is normal in structure. Aortic valve regurgitation is not visualized. FINDINGS  Left Ventricle: Left ventricular ejection fraction, by estimation, is 55 to 60%. The left ventricle has normal function. The left ventricle has no regional wall motion abnormalities. The left ventricular internal cavity size was normal in size. There is  no left ventricular hypertrophy. Left ventricular diastolic parameters were normal. Right Ventricle: The right ventricular size is normal. No increase in right ventricular wall thickness. Right ventricular systolic function is normal. Left Atrium: Left atrial size was normal in size. Right Atrium: Right atrial size was normal in size. Pericardium: There is no evidence of pericardial effusion. Mitral Valve: The mitral valve is normal in  structure. No evidence of mitral valve regurgitation. Tricuspid Valve: The tricuspid valve is grossly normal. Tricuspid valve regurgitation is trivial. Aortic Valve: The aortic valve is normal in structure. Aortic valve regurgitation is not visualized. Pulmonic Valve: The pulmonic valve was grossly normal. Pulmonic valve regurgitation is not visualized. Aorta: The aortic root and ascending aorta are structurally normal, with no evidence of dilitation. IAS/Shunts: The atrial septum is grossly normal.  LEFT VENTRICLE PLAX 2D LVIDd:         4.70 cm      Diastology LVIDs:         3.00 cm      LV e' medial:    6.85 cm/s LV PW:         1.30 cm      LV E/e' medial:  13.7 LV IVS:        1.20 cm  LV e' lateral:   9.79 cm/s LVOT diam:     2.00 cm      LV E/e' lateral: 9.6 LV SV:         88 LV SV Index:   40 LVOT Area:     3.14 cm  LV Volumes (MOD) LV vol d, MOD A2C: 97.7 ml LV vol d, MOD A4C: 146.0 ml LV vol s, MOD A2C: 42.0 ml LV vol s, MOD A4C: 46.4 ml LV SV MOD A2C:     55.7 ml LV SV MOD A4C:     146.0 ml LV SV MOD BP:      76.1 ml RIGHT VENTRICLE             IVC RV S prime:     12.80 cm/s  IVC diam: 1.50 cm TAPSE (M-mode): 2.1 cm LEFT ATRIUM             Index       RIGHT ATRIUM           Index LA diam:        3.30 cm 1.51 cm/m  RA Area:     12.00 cm LA Vol (A2C):   46.6 ml 21.37 ml/m RA Volume:   21.60 ml  9.91 ml/m LA Vol (A4C):   29.6 ml 13.58 ml/m LA Biplane Vol: 37.7 ml 17.29 ml/m  AORTIC VALVE LVOT Vmax:   131.00 cm/s LVOT Vmean:  91.900 cm/s LVOT VTI:    0.279 m  AORTA Ao Root diam: 3.70 cm MITRAL VALVE MV Area (PHT): 4.06 cm    SHUNTS MV Decel Time: 187 msec    Systemic VTI:  0.28 m MV E velocity: 93.60 cm/s  Systemic Diam: 2.00 cm MV A velocity: 84.40 cm/s MV E/A ratio:  1.11 Mertie Moores MD Electronically signed by Mertie Moores MD Signature Date/Time: 02/17/2021/3:41:34 PM    Final     ASSESSMENT AND PLAN: 1.    Pancreatic/duodenal mass, biopsy pending -CT abdomen/pelvis without contrast  02/16/2021-multiple large hypoechoic masses in the patient's liver consistent metastatic disease, ill-defined masslike area in the pancreatic head measuring approximate 4.7 cm, possible filling defect in the right ventricle. -EGD 02/19/2021-large infiltrative mass with bleeding in the second portion of the duodenum, appears to be arising near the ampulla, partially obstructive with friable mucosa. -Flexible sigmoidoscopy 02/19/2021-diverticulosis in the sigmoid colon, nonbleeding external and internal hemorrhoids -Cardiac CT 02/20/2021-mass in the mid RV attached to the interventricular septum measuring 16 mm x 6 mm and concerning for metastatic tumor -MRI abdomen with/without contrast 02/21/2021-mass in the posterior pancreatic head measuring 4.3 x 3.8 cm with obstruction of the central pancreatic duct and diffuse dilatation up to 6 mm consistent with pancreatic adenocarcinoma, liver lesions the largest mass of the central left lobe measuring 9.9 x 9.2 cm consistent with hepatic metastatic disease, hepatomegaly. 2.  Iron deficiency anemia -Feraheme 510 mg 02/20/2021 3.  Protein calorie malnutrition 4.  Possible right ventricular filling defect on noncontrast CT scan 5.  Hypertension 6.  History of CVA 7.  Hyperlipidemia 8.  Hypothyroidism 9.  Morbid obesity 10.  Asthma 11.  Migraines  Wendy Hoffman appears stable.  Imaging findings are concerning for metastatic pancreatic versus duodenal malignancy.  Biopsy pending.  Wendy Hoffman is noted to have liver lesions as well as a mass in the mid RV noted on cardiac CT.  Wendy Hoffman is aware that no therapy will be curative.  We will consider her for systemic chemotherapy as an outpatient.  The patient  is scheduled for liver biopsy today.  Will also add on a CEA and CA 19.9.  We will request Port-A-Cath placement.  Wendy Hoffman received a dose of Feraheme 510 mg IV on 02/20/2021.  Her hemoglobin is stable.  May consider additional Feraheme as an outpatient.  Pain is not fully  controlled.  Will adjust pain medication.  Wendy Hoffman is still having difficulty tolerating a soft diet.  May need to consider stent placement or feeding tube if unable to tolerate oral diet.  Recommendations: 1.  Await biopsy results from EGD last week. 2.  Proceed with liver biopsy today as scheduled. 3.  Add on CEA and CA 19.9. 4.  Port-A-Cath placement by IR. 5.  May need to consider stent placement for feeding tube due to difficulty tolerating soft diet. 6.  Will adjust pain medication. 7.  We will arrange for outpatient follow-up at the cancer center to discuss systemic treatment options.   LOS: 7 days   Mikey Bussing, DNP, AGPCNP-BC, AOCNP 02/23/21  Wendy Hoffman continues to have nausea and abdominal pain.  Wendy Hoffman reports the pain is relieved for a short period of time with Dilaudid.  I will recommend adding an oral narcotic.  We will follow up on the duodenal mass pathology today.  If the pathology confirms carcinoma we will recommend a liver biopsy to confirm a diagnosis of metastatic disease and obtain tissue for molecular testing.  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  We will ask interventional radiology to place a Port-A-Cath prior to discharge from the hospital.  Wendy Hoffman can be discharged if Wendy Hoffman is able to tolerate a diet and the Port-A-Cath is placed.  Julieanne Manson, MD

## 2021-02-23 NOTE — Consult Note (Incomplete)
Chief Complaint: Patient was seen in consultation today for  Chief Complaint  Patient presents with  . Abdominal Pain  . Emesis    Referring Physician(s): * No referring provider recorded for this case *  Supervising Physician: {Supervising Physician:[id number]}  Patient Status: {IR Consult Patient Status:21879}  History of Present Illness: Wendy Hoffman is a 55 y.o. female ***  Past Medical History:  Diagnosis Date  . Anemia   . Anxiety   . Arthritis   . Asthma   . Cancer ([place])    cervic  . H/O: hysterectomy   . High cholesterol   . Hypertension   . Hypothyroidism   . Insomnia   . Medical history non-contributory   . Migraine   . Morbid obesity ([place])   . Shortness of breath   . Stroke ([place])   . Thyroid disease     Past Surgical History:  Procedure Laterality Date  . ABDOMINAL HYSTERECTOMY    . BIOPSY  02/19/2021   Procedure: BIOPSY;  Surgeon: Mauri Pole, MD;  Location: [facility];  Service: Endoscopy;;  . CESAREAN SECTION     3 occasions  . CHOLECYSTECTOMY    . ESOPHAGOGASTRODUODENOSCOPY (EGD) WITH PROPOFOL N/A 02/19/2021   Procedure: ESOPHAGOGASTRODUODENOSCOPY (EGD) WITH PROPOFOL;  Surgeon: Mauri Pole, MD;  Location: [facility];  Service: Endoscopy;  Laterality: N/A;  . FLEXIBLE SIGMOIDOSCOPY N/A 02/19/2021   Procedure: FLEXIBLE SIGMOIDOSCOPY;  Surgeon: Mauri Pole, MD;  Location: [facility];  Service: Endoscopy;  Laterality: N/A;  . THYROIDECTOMY, PARTIAL     Goiter    Allergies: Blueberry [vaccinium angustifolium], Mango flavor, Aspirin, and Penicillins  Medications: Prior to Admission medications   Medication Sig Start Date End Date Taking? Authorizing Provider  albuterol (PROVENTIL) (2.5 MG/3ML) 0.083% nebulizer solution Take 3 mLs (2.5 mg total) by nebulization every 4 (four) hours as needed for wheezing or shortness of breath. 78/2/95  Yes Delora Fuel, MD  albuterol (VENTOLIN HFA) 108 (90 Base) MCG/ACT inhaler  INHALE 1-2 PUFFS INTO THE LUNGS EVERY 6 (SIX) HOURS AS NEEDED FOR WHEEZING OR SHORTNESS OF BREATH. 03/07/20  Yes Newlin, Enobong, MD  amLODipine (NORVASC) 10 MG tablet Take 1 tablet (10 mg total) by mouth daily. 11/19/19  Yes Charlott Rakes, MD  baclofen (LIORESAL) 10 MG tablet Take 10 mg by mouth 2 (two) times daily.   Yes [provider]  DEXILANT 60 MG capsule Take 1 capsule by mouth daily. 09/19/20  Yes [provider]  gabapentin (NEURONTIN) 300 MG capsule Take 300 mg by mouth at bedtime. 01/07/21  Yes [provider]  ipratropium (ATROVENT) 0.02 % nebulizer solution Take 0.25 mg by nebulization daily as needed for wheezing.   Yes [provider]  methocarbamol (ROBAXIN) 750 MG tablet Take 750 mg by mouth 2 (two) times daily.   Yes [provider]  oxyCODONE-acetaminophen (PERCOCET/ROXICET) 5-325 MG tablet Take 1 tablet by mouth every 4 (four) hours as needed for moderate pain. 10/03/20  Yes [provider]  rosuvastatin (CRESTOR) 5 MG tablet Take 5 mg by mouth daily.   Yes [provider]  sucralfate (CARAFATE) 1 GM/10ML suspension Take 10 mLs (1 g total) by mouth 4 (four) times daily -  with meals and at bedtime. 07/21/20  Yes Robinson, [place] N, PA-C  SUMAtriptan (IMITREX) 100 MG tablet Take 100 mg by mouth daily as needed for migraine.   Yes [provider]  SYMBICORT 160-4.5 MCG/ACT inhaler INHALE 2 PUFFS INTO THE LUNGS 2 (TWO) TIMES DAILY.  Patient taking differently: Inhale 2 puffs into the lungs 2 (two) times daily. 03/07/20  Yes Newlin, Charlane Ferretti, MD  zolpidem (AMBIEN) 10 MG tablet Take 10 mg by mouth daily.   Yes [provider]  cyclobenzaprine (FLEXERIL) 10 MG tablet TAKE 1 TABLET (10 MG TOTAL) BY MOUTH 3 (THREE) TIMES DAILY AS NEEDED FOR MUSCLE SPASMS. Patient not taking: No sig reported 12/27/19   Charlott Rakes, MD  HYDROcodone-acetaminophen (NORCO/VICODIN) 5-325 MG tablet Take 1 tablet by mouth every 6 (six)  hours as needed for severe pain. Patient not taking: No sig reported 07/21/20   Robinson, [place] N, PA-C  omeprazole (PRILOSEC) 20 MG capsule Take 1 capsule (20 mg total) by mouth daily. Patient not taking: No sig reported 07/21/20   Robinson, [place] N, PA-C  ondansetron (ZOFRAN ODT) 4 MG disintegrating tablet Take 1 tablet (4 mg total) by mouth every 8 (eight) hours as needed for nausea or vomiting. Patient not taking: No sig reported 07/21/20   Robinson, [place] N, PA-C  predniSONE (DELTASONE) 20 MG tablet Take 2 tablets (40 mg total) by mouth daily. Patient not taking: No sig reported 10/08/20   Little, Wenda Overland, MD  topiramate (TOPAMAX) 50 MG tablet Take 1 tablet (50 mg total) by mouth 2 (two) times daily. Patient not taking: No sig reported 11/19/19   Charlott Rakes, MD  fluticasone (FLONASE) 50 MCG/ACT nasal spray Place 1 spray into both nostrils daily. Patient not taking: Reported on 03/21/2019 12/29/17 06/29/19  Petrucelli, Glynda Jaeger, PA-C     Family History  Problem Relation Age of Onset  . Migraines Sister     Social History   Socioeconomic History  . Marital status: Single    Spouse name: Not on file  . Number of children: Not on file  . Years of education: Not on file  . Highest education level: Not on file  Occupational History  . Not on file  Tobacco Use  . Smoking status: Former Smoker    Types: Cigarettes  . Smokeless tobacco: Never Used  Vaping Use  . Vaping Use: Never used  Substance and Sexual Activity  . Alcohol use: No  . Drug use: No  . Sexual activity: Not on file  Other Topics Concern  . Not on file  Social History Narrative  . Not on file   Social Determinants of Health   Financial Resource Strain: Not on file  Food Insecurity: Not on file  Transportation Needs: Not on file  Physical Activity: Not on file  Stress: Not on file  Social Connections: Not on file    Review of Systems: A 12 point ROS discussed and pertinent positives are indicated  in the HPI above.  All other systems are negative.  Review of Systems  Vital Signs: BP 110/72 (BP Location: Left Arm)   Pulse 84   Temp 98.5 F (36.9 C) (Oral)   Resp 20   Ht 5\' 4"  (1.626 m)   Wt 258 lb 1.6 oz (117.1 kg)   SpO2 100%   BMI 44.30 kg/m   Physical Exam  Imaging: CT ABDOMEN PELVIS WO CONTRAST  Result Date: 02/16/2021 CLINICAL DATA:  Abdominal distension. EXAM: CT ABDOMEN AND PELVIS WITHOUT CONTRAST TECHNIQUE: Multidetector CT imaging of the abdomen and pelvis was performed following the standard protocol without IV contrast. COMPARISON:  None recent FINDINGS: Lower chest: The lung bases are clear.There is a possible filling defect within the right ventricle (axial series 2, image 4). The intracardiac blood pool is hypodense relative  to the adjacent myocardium consistent with anemia. Hepatobiliary: Multiple large hypoechoic masses are noted in the patient's liver. The largest is located in the left hepatic lobe measures approximately 10 cm. The gallbladder is not well evaluated.There is no biliary ductal dilation. Pancreas: There is an ill-defined masslike area in the pancreatic head measuring approximately 4.7 cm (axial series 2, image 41). Spleen: Unremarkable. Adrenals/Urinary Tract: --Adrenal glands: Unremarkable. --Right kidney/ureter: No hydronephrosis or radiopaque kidney stones. --Left kidney/ureter: There is a punctate nonobstructing stone in the upper pole the left kidney. --Urinary bladder: Unremarkable. Stomach/Bowel: --Stomach/Duodenum: No hiatal hernia or other gastric abnormality. Normal duodenal course and caliber. --Small bowel: Unremarkable. --Colon: Unremarkable. --Appendix: Normal. Vascular/Lymphatic: Atherosclerotic calcification is present within the non-aneurysmal abdominal aorta, without hemodynamically significant stenosis. --No retroperitoneal lymphadenopathy. --No mesenteric lymphadenopathy. --No pelvic or inguinal lymphadenopathy. Reproductive: Status post  hysterectomy. No adnexal mass. Other: No ascites or free air. The abdominal wall is normal. Musculoskeletal. No acute displaced fractures. IMPRESSION: 1. Multiple large hypoechoic masses in the patient's liver, consistent with metastatic disease. These are amenable to percutaneous biopsy as clinically indicated. 2. Ill-defined masslike area in the pancreatic head measuring approximately 4.7 cm. This is concerning for pancreatic adenocarcinoma. Exam limited by lack of IV contrast. A follow-up contrast enhanced MRI would be useful for further evaluation and staging. 3. Possible filling defect within the right ventricle. Recommend further evaluation with a dedicated cardiac echo. 4. Anemia. 5. Punctate nonobstructing stone in the upper pole of the left kidney. Aortic Atherosclerosis (ICD10-I70.0). Electronically Signed   By: Constance Holster M.D.   On: 02/16/2021 16:04   MR ABDOMEN W WO CONTRAST  Result Date: 02/21/2021 CLINICAL DATA:  Pancreatic cancer, metastatic disease evaluation EXAM: MRI ABDOMEN WITHOUT AND WITH CONTRAST TECHNIQUE: Multiplanar multisequence MR imaging of the abdomen was performed both before and after the administration of intravenous contrast. CONTRAST:  31mL GADAVIST GADOBUTROL 1 MMOL/ML IV SOLN COMPARISON:  CT abdomen pelvis, 02/16/2021 FINDINGS: Lower chest: Trace bilateral pleural effusions. Hepatobiliary: Hepatomegaly, maximum coronal span 22.4 cm. There are faintly contrast enhancing, intrinsically T1 and T2 hyperintense masses of varying sizes throughout the liver, largest mass of the central left lobe measuring 9.9 x 9.2 cm (series 5, image 12). These are generally isosignal, isoenhancing to pancreatic head mass. Status post cholecystectomy. Mild dilatation of the common bile duct, measuring up to 8 mm and tapering to the ampulla. Pancreas: There is a mass of the posterior pancreatic head measuring 4.3 x 3.8 cm with obstruction of the central pancreatic duct and diffuse dilatation  up to 6 mm. Spleen:  Within normal limits in size and appearance. Adrenals/Urinary Tract: No masses identified. No evidence of hydronephrosis. Stomach/Bowel: Visualized portions within the abdomen are unremarkable. Vascular/Lymphatic: No pathologically enlarged lymph nodes identified. No abdominal aortic aneurysm demonstrated. Left portal vein appears to be partially effaced by liver masses. The central portal vein is patent. Other:  None. Musculoskeletal: No suspicious bone lesions identified. IMPRESSION: 1. There is a mass of the posterior pancreatic head measuring 4.3 x 3.8 cm with obstruction of the central pancreatic duct and diffuse dilatation up to 6 mm. This is consistent with pancreatic adenocarcinoma. 2. There are faintly contrast enhancing, intrinsically T1 and T2 hyperintense masses of varying sizes throughout the liver, largest mass of the central left lobe measuring 9.9 x 9.2 cm. These are consistent with hepatic metastatic disease. 3. Mild dilatation of the common bile duct, measuring up to 8 mm and tapering to the ampulla. This is likely related to prior  cholecystectomy without evidence of overt common bile duct obstruction. 4. Trace bilateral pleural effusions. 5. Hepatomegaly. Electronically Signed   By: Eddie Candle M.D.   On: 02/21/2021 13:31   MR 3D Recon At Scanner  Result Date: 02/21/2021 CLINICAL DATA:  Pancreatic cancer, metastatic disease evaluation EXAM: MRI ABDOMEN WITHOUT AND WITH CONTRAST TECHNIQUE: Multiplanar multisequence MR imaging of the abdomen was performed both before and after the administration of intravenous contrast. CONTRAST:  86mL GADAVIST GADOBUTROL 1 MMOL/ML IV SOLN COMPARISON:  CT abdomen pelvis, 02/16/2021 FINDINGS: Lower chest: Trace bilateral pleural effusions. Hepatobiliary: Hepatomegaly, maximum coronal span 22.4 cm. There are faintly contrast enhancing, intrinsically T1 and T2 hyperintense masses of varying sizes throughout the liver, largest mass of the  central left lobe measuring 9.9 x 9.2 cm (series 5, image 12). These are generally isosignal, isoenhancing to pancreatic head mass. Status post cholecystectomy. Mild dilatation of the common bile duct, measuring up to 8 mm and tapering to the ampulla. Pancreas: There is a mass of the posterior pancreatic head measuring 4.3 x 3.8 cm with obstruction of the central pancreatic duct and diffuse dilatation up to 6 mm. Spleen:  Within normal limits in size and appearance. Adrenals/Urinary Tract: No masses identified. No evidence of hydronephrosis. Stomach/Bowel: Visualized portions within the abdomen are unremarkable. Vascular/Lymphatic: No pathologically enlarged lymph nodes identified. No abdominal aortic aneurysm demonstrated. Left portal vein appears to be partially effaced by liver masses. The central portal vein is patent. Other:  None. Musculoskeletal: No suspicious bone lesions identified. IMPRESSION: 1. There is a mass of the posterior pancreatic head measuring 4.3 x 3.8 cm with obstruction of the central pancreatic duct and diffuse dilatation up to 6 mm. This is consistent with pancreatic adenocarcinoma. 2. There are faintly contrast enhancing, intrinsically T1 and T2 hyperintense masses of varying sizes throughout the liver, largest mass of the central left lobe measuring 9.9 x 9.2 cm. These are consistent with hepatic metastatic disease. 3. Mild dilatation of the common bile duct, measuring up to 8 mm and tapering to the ampulla. This is likely related to prior cholecystectomy without evidence of overt common bile duct obstruction. 4. Trace bilateral pleural effusions. 5. Hepatomegaly. Electronically Signed   By: Eddie Candle M.D.   On: 02/21/2021 13:31   MR CARDIAC MORPHOLOGY W WO CONTRAST  Result Date: 02/21/2021 CLINICAL DATA:  RV filling defect on CT EXAM: CARDIAC MRI TECHNIQUE: The patient was scanned on a 1.5 Tesla Siemens magnet. A dedicated cardiac coil was used. Functional imaging was done using  [employer] sequences. 2,3, and 4 chamber views were done to assess for RWMA's. Modified Simpson's rule using a short axis stack was used to calculate an ejection fraction on a dedicated work Conservation officer, nature. The patient received 10 cc of Gadavist. After 10 minutes inversion recovery sequences were used to assess for infiltration and scar tissue. CONTRAST:  10 cc  of Gadavist FINDINGS: Left ventricle: -Normal size -Normal systolic function -Elevated ECV (36%) -No LGE LV EF:  53% (Normal 56-78%) Absolute volumes: LV EDV: 117mL (Normal 52-141 mL) LV ESV: 70mL (Normal 13-51 mL) LV SV: 6mL (Normal 33-97 mL) CO: 7.4L/min (Normal 2.7-6.0 L/min) Indexed volumes: LV EDV: 69mL/sq-m (Normal 41-81 mL/sq-m) LV ESV: 49mL/sq-m (Normal 12-21 mL/sq-m) LV SV: 32mL/sq-m (Normal 26-56 mL/sq-m) CI: 3.2L/min/sq-m (Normal 1.8-3.8 L/min/sq-m) Right ventricle: -Normal size and systolic function -Mass in mid RV attached to interventricular septum measuring 20mm x 8mm. Mass is heterogenous, with central area of low T1/high T2 and peripheral area of  high T1. Dark on EGE but heterogenous area of enhancement on LGE RV EF: 59% (Normal 47-80%) Absolute volumes: RV EDV: 157mL (Normal 58-154 mL) RV ESV: 77mL (Normal 12-68 mL) RV SV: 51mL (Normal 35-98 mL) CO: 7.5L/min (Normal 2.7-6 L/min) Indexed volumes: RV EDV: 47mL/sq-m (Normal 48-87 mL/sq-m) RV ESV: 28mL/sq-m (Normal 11-28 mL/sq-m) RV SV: 46mL/sq-m (Normal 27-57 mL/sq-m) CI: 3.2L/min/sq-m (Normal 1.8-3.8 L/min/sq-m) Left atrium: Mild enlargement Right atrium: Normal size Mitral valve: No regurgitation Aortic valve: No regurgitation Tricuspid valve: No regurgitation Pulmonic valve: No regurgitation Aorta: Normal proximal ascending aorta Pericardium: Normal IMPRESSION: 1. Mass in mid RV attached to interventricular septum measures 76mm x 87mm. Mass appears heterogenous, with area of enhancement on LGE imaging. Given patient with suspected malignancy, mass is concerning for metastatic  tumor 2. Normal LV size and systolic function (EF 77%). No late gadolinium enhancement to suggest myocardial scar 3.  Normal RV size and systolic function (EF 82%) Electronically Signed   By: Oswaldo Milian MD   On: 02/21/2021 01:00   ECHOCARDIOGRAM COMPLETE  Result Date: 02/17/2021    ECHOCARDIOGRAM REPORT   Patient Name:   Wendy Hoffman Date of Exam: 02/17/2021 Medical Rec #:  [id number]       Height:       64.0 in Accession #:    [id number]      Weight:       258.1 lb Date of Birth:  1966/06/09       BSA:          2.180 m Patient Age:    3 years        BP:           117/77 mmHg Patient Gender: F               HR:           69 bpm. Exam Location:  Inpatient Procedure: 2D Echo, 3D Echo, Cardiac Doppler and Color Doppler Indications:    R94.31 Abnormal EKG  History:        Patient has prior history of Echocardiogram examinations, most                 recent 04/18/2008. COPD, Signs/Symptoms:Chest Pain; Risk                 Factors:Hypertension, Dyslipidemia and Current Smoker.  Sonographer:    Roseanna Rainbow RDCS Referring Phys: [id number] [facility]  Sonographer Comments: Technically difficult study due to poor echo windows and patient is morbidly obese. Image acquisition challenging due to patient body habitus. IMPRESSIONS  1. Left ventricular ejection fraction, by estimation, is 55 to 60%. The left ventricle has normal function. The left ventricle has no regional wall motion abnormalities. Left ventricular diastolic parameters were normal.  2. Right ventricular systolic function is normal. The right ventricular size is normal.  3. The mitral valve is normal in structure. No evidence of mitral valve regurgitation.  4. The aortic valve is normal in structure. Aortic valve regurgitation is not visualized. FINDINGS  Left Ventricle: Left ventricular ejection fraction, by estimation, is 55 to 60%. The left ventricle has normal function. The left ventricle has no regional wall motion abnormalities. The left ventricular  internal cavity size was normal in size. There is  no left ventricular hypertrophy. Left ventricular diastolic parameters were normal. Right Ventricle: The right ventricular size is normal. No increase in right ventricular wall thickness. Right ventricular systolic function is normal. Left Atrium: Left atrial size was normal in size. Right  Atrium: Right atrial size was normal in size. Pericardium: There is no evidence of pericardial effusion. Mitral Valve: The mitral valve is normal in structure. No evidence of mitral valve regurgitation. Tricuspid Valve: The tricuspid valve is grossly normal. Tricuspid valve regurgitation is trivial. Aortic Valve: The aortic valve is normal in structure. Aortic valve regurgitation is not visualized. Pulmonic Valve: The pulmonic valve was grossly normal. Pulmonic valve regurgitation is not visualized. Aorta: The aortic root and ascending aorta are structurally normal, with no evidence of dilitation. IAS/Shunts: The atrial septum is grossly normal.  LEFT VENTRICLE PLAX 2D LVIDd:         4.70 cm      Diastology LVIDs:         3.00 cm      LV e' medial:    6.85 cm/s LV PW:         1.30 cm      LV E/e' medial:  13.7 LV IVS:        1.20 cm      LV e' lateral:   9.79 cm/s LVOT diam:     2.00 cm      LV E/e' lateral: 9.6 LV SV:         88 LV SV Index:   40 LVOT Area:     3.14 cm  LV Volumes (MOD) LV vol d, MOD A2C: 97.7 ml LV vol d, MOD A4C: 146.0 ml LV vol s, MOD A2C: 42.0 ml LV vol s, MOD A4C: 46.4 ml LV SV MOD A2C:     55.7 ml LV SV MOD A4C:     146.0 ml LV SV MOD BP:      76.1 ml RIGHT VENTRICLE             IVC RV S prime:     12.80 cm/s  IVC diam: 1.50 cm TAPSE (M-mode): 2.1 cm LEFT ATRIUM             Index       RIGHT ATRIUM           Index LA diam:        3.30 cm 1.51 cm/m  RA Area:     12.00 cm LA Vol (A2C):   46.6 ml 21.37 ml/m RA Volume:   21.60 ml  9.91 ml/m LA Vol (A4C):   29.6 ml 13.58 ml/m LA Biplane Vol: 37.7 ml 17.29 ml/m  AORTIC VALVE LVOT Vmax:   131.00 cm/s LVOT  Vmean:  91.900 cm/s LVOT VTI:    0.279 m  AORTA Ao Root diam: 3.70 cm MITRAL VALVE MV Area (PHT): 4.06 cm    SHUNTS MV Decel Time: 187 msec    Systemic VTI:  0.28 m MV E velocity: 93.60 cm/s  Systemic Diam: 2.00 cm MV A velocity: 84.40 cm/s MV E/A ratio:  1.11 Mertie Moores MD Electronically signed by Mertie Moores MD Signature Date/Time: 02/17/2021/3:41:34 PM    Final     Labs:  CBC: Recent Labs    02/19/21 0431 02/20/21 0450 02/21/21 0323 02/23/21 0350  WBC 11.1* 10.0 9.1 9.5  HGB 7.8* 6.8* 7.2* 7.5*  HCT 27.0* 22.6* 23.8* 25.0*  PLT 356 286 277 292    COAGS: Recent Labs    02/20/21 2006  INR 0.9    BMP: Recent Labs    07/21/20 1857 02/16/21 1224 02/19/21 0431 02/20/21 0450 02/21/21 0323 02/23/21 0350  NA 139   < > 138 140 140 139  K 3.7   < >  3.4* 3.4* 3.0* 3.3*  CL 108   < > 103 104 105 102  CO2 22   < > 23 26 25  32  GLUCOSE 105*   < > 110* 100* 107* 94  BUN 14   < > 13 9 9 6   CALCIUM 9.3   < > 9.4 9.1 9.0 9.1  CREATININE 0.76   < > 0.87 0.67 0.65 0.54  GFRNONAA >60   < > >60 >60 >60 >60  GFRAA >60  --   --   --   --   --    < > = values in this interval not displayed.    LIVER FUNCTION TESTS: Recent Labs    07/21/20 1857 02/16/21 1224 02/16/21 2252 02/17/21 0845  BILITOT 0.3 0.2* 0.5 1.0  AST 14* 29 24 27   ALT 13 20 19 20   ALKPHOS 61 82 83 87  PROT 7.0 7.6 7.0 7.1  ALBUMIN 3.9 3.7 3.3* 3.5    TUMOR MARKERS: No results for input(s): AFPTM, CEA, CA199, CHROMGRNA in the last 8760 hours.  Assessment and Plan:  ***  Thank you for this interesting consult.  I greatly enjoyed meeting Wendy Hoffman and look forward to participating in their care.  A copy of this report was sent to the requesting provider on this date.  Electronically Signed: Soyla Dryer, AGACNP-BC [phone] 02/23/2021, 1:30 PM   I spent a total of {New YQIH:[id number]} {New Out-Pt:[id number]}  {Established Out-Pt:[id number]} in face to face in clinical consultation,  greater than 50% of which was counseling/coordinating care for ***

## 2021-02-24 ENCOUNTER — Ambulatory Visit: Payer: Medicaid Other | Admitting: Radiation Oncology

## 2021-02-24 ENCOUNTER — Ambulatory Visit
Admit: 2021-02-24 | Discharge: 2021-02-24 | Disposition: A | Discharge status: Home / Self Care | Payer: Medicaid Other | Source: Ambulatory Visit | Attending: Radiation Oncology | Admitting: Radiation Oncology

## 2021-02-24 ENCOUNTER — Inpatient Hospital Stay (HOSPITAL_COMMUNITY): Payer: Medicaid Other

## 2021-02-24 DIAGNOSIS — C787 Secondary malignant neoplasm of liver and intrahepatic bile duct: Secondary | ICD-10-CM | POA: Insufficient documentation

## 2021-02-24 DIAGNOSIS — G43909 Migraine, unspecified, not intractable, without status migrainosus: Secondary | ICD-10-CM | POA: Insufficient documentation

## 2021-02-24 DIAGNOSIS — Z510 Encounter for antineoplastic radiation therapy: Secondary | ICD-10-CM | POA: Insufficient documentation

## 2021-02-24 DIAGNOSIS — J45909 Unspecified asthma, uncomplicated: Secondary | ICD-10-CM | POA: Insufficient documentation

## 2021-02-24 DIAGNOSIS — D509 Iron deficiency anemia, unspecified: Secondary | ICD-10-CM | POA: Insufficient documentation

## 2021-02-24 DIAGNOSIS — I10 Essential (primary) hypertension: Secondary | ICD-10-CM | POA: Insufficient documentation

## 2021-02-24 DIAGNOSIS — C250 Malignant neoplasm of head of pancreas: Secondary | ICD-10-CM | POA: Insufficient documentation

## 2021-02-24 DIAGNOSIS — E039 Hypothyroidism, unspecified: Secondary | ICD-10-CM | POA: Insufficient documentation

## 2021-02-24 HISTORY — PX: IR IMAGING GUIDED PORT INSERTION: IMG5740

## 2021-02-24 HISTORY — PX: IR US GUIDE BX ASP/DRAIN: IMG2392

## 2021-02-24 LAB — CBC WITH DIFFERENTIAL/PLATELET
Abs Immature Granulocytes: 0 10*3/uL (ref 0.00–0.07)
Basophils Absolute: 0 10*3/uL (ref 0.0–0.1)
Basophils Relative: 0 %
Eosinophils Absolute: 0.1 10*3/uL (ref 0.0–0.5)
Eosinophils Relative: 1 %
HCT: 29.3 % — ABNORMAL LOW (ref 36.0–46.0)
Hemoglobin: 8.5 g/dL — ABNORMAL LOW (ref 12.0–15.0)
Lymphocytes Relative: 19 %
Lymphs Abs: 2.1 10*3/uL (ref 0.7–4.0)
MCH: 24.4 pg — ABNORMAL LOW (ref 26.0–34.0)
MCHC: 29 g/dL — ABNORMAL LOW (ref 30.0–36.0)
MCV: 84.2 fL (ref 80.0–100.0)
Monocytes Absolute: 0.6 10*3/uL (ref 0.1–1.0)
Monocytes Relative: 6 %
Neutro Abs: 8 10*3/uL — ABNORMAL HIGH (ref 1.7–7.7)
Neutrophils Relative %: 74 %
Platelets: 342 10*3/uL (ref 150–400)
RBC: 3.48 MIL/uL — ABNORMAL LOW (ref 3.87–5.11)
RDW: 26.1 % — ABNORMAL HIGH (ref 11.5–15.5)
WBC: 10.8 10*3/uL — ABNORMAL HIGH (ref 4.0–10.5)
nRBC: 0 % (ref 0.0–0.2)

## 2021-02-24 LAB — CANCER ANTIGEN 19-9: CA 19-9: 17515 U/mL — ABNORMAL HIGH (ref 0–35)

## 2021-02-24 LAB — BASIC METABOLIC PANEL
Anion gap: 12 (ref 5–15)
BUN: 7 mg/dL (ref 6–20)
CO2: 26 mmol/L (ref 22–32)
Calcium: 9.3 mg/dL (ref 8.9–10.3)
Chloride: 100 mmol/L (ref 98–111)
Creatinine, Ser: 0.72 mg/dL (ref 0.44–1.00)
GFR, Estimated: >60 mL/min (ref >60)
Glucose, Bld: 90 mg/dL (ref 70–99)
Potassium: 3.7 mmol/L (ref 3.5–5.1)
Sodium: 138 mmol/L (ref 135–145)

## 2021-02-24 LAB — CEA: CEA: 68.8 ng/mL — ABNORMAL HIGH (ref 0.0–4.7)

## 2021-02-24 MED ORDER — POLYETHYLENE GLYCOL 3350 17 G PO PACK
17.0000 g | PACK | Freq: Every day | ORAL | Status: DC | PRN
  Administered 2021-02-25 – 2021-02-27 (×3): 17 g via ORAL
  Filled 2021-02-24 (×3): qty 1

## 2021-02-24 MED ORDER — MORPHINE SULFATE (CONCENTRATE) 10 MG/0.5ML PO SOLN
10.0000 mg | ORAL | Status: DC | PRN
Start: 2021-02-24 — End: 2021-02-25
  Administered 2021-02-24 (×2): 10 mg via ORAL
  Filled 2021-02-24 (×2): qty 0.5

## 2021-02-24 MED ORDER — MIDAZOLAM HCL 2 MG/2ML IJ SOLN
INTRAMUSCULAR | Status: AC
  Filled 2021-02-24: qty 4

## 2021-02-24 MED ORDER — ENSURE ENLIVE PO LIQD
237.0000 mL | Freq: Two times a day (BID) | ORAL | Status: DC
  Administered 2021-02-24 – 2021-02-28 (×7): 237 mL via ORAL

## 2021-02-24 MED ORDER — PANTOPRAZOLE SODIUM 40 MG PO TBEC
40.0000 mg | DELAYED_RELEASE_TABLET | Freq: Two times a day (BID) | ORAL | Status: DC
  Administered 2021-02-24 – 2021-02-28 (×8): 40 mg via ORAL
  Filled 2021-02-24 (×8): qty 1

## 2021-02-24 MED ORDER — ZOLPIDEM TARTRATE 5 MG PO TABS
5.0000 mg | ORAL_TABLET | Freq: Every day | ORAL | Status: DC
Start: 2021-02-24 — End: 2021-02-28
  Administered 2021-02-24 – 2021-02-27 (×4): 5 mg via ORAL
  Filled 2021-02-24 (×4): qty 1

## 2021-02-24 MED ORDER — LIDOCAINE-EPINEPHRINE 1 %-1:100000 IJ SOLN
INTRAMUSCULAR | Status: AC
  Filled 2021-02-24: qty 2

## 2021-02-24 MED ORDER — LIDOCAINE-EPINEPHRINE 1 %-1:100000 IJ SOLN
INTRAMUSCULAR | Status: AC | PRN
  Administered 2021-02-24: 10 mL
  Administered 2021-02-24: 10 mL via INTRADERMAL

## 2021-02-24 MED ORDER — FENTANYL CITRATE (PF) 100 MCG/2ML IJ SOLN
INTRAMUSCULAR | Status: AC
  Filled 2021-02-24: qty 4

## 2021-02-24 MED ORDER — METOCLOPRAMIDE HCL 5 MG PO TABS
5.0000 mg | ORAL_TABLET | Freq: Three times a day (TID) | ORAL | Status: DC
  Filled 2021-02-24: qty 1

## 2021-02-24 MED ORDER — GELATIN ABSORBABLE 12-7 MM EX MISC
CUTANEOUS | Status: AC
  Filled 2021-02-24: qty 1

## 2021-02-24 MED ORDER — FENTANYL CITRATE (PF) 100 MCG/2ML IJ SOLN
INTRAMUSCULAR | Status: AC | PRN
Start: 2021-02-24 — End: 2021-02-24
  Administered 2021-02-24 (×3): 50 ug via INTRAVENOUS

## 2021-02-24 MED ORDER — GELATIN ABSORBABLE 12-7 MM EX MISC
CUTANEOUS | Status: AC | PRN
  Administered 2021-02-24: 1 via TOPICAL

## 2021-02-24 MED ORDER — MIDAZOLAM HCL 2 MG/2ML IJ SOLN
INTRAMUSCULAR | Status: AC | PRN
  Administered 2021-02-24 (×4): 1 mg via INTRAVENOUS

## 2021-02-24 NOTE — Sedation Documentation (Signed)
Liver biopsy procedure finished. Patient being prepped for port a cath placement.

## 2021-02-24 NOTE — Progress Notes (Signed)
Nutrition Follow-up  DOCUMENTATION CODES:   Morbid obesity  INTERVENTION:  - will d/c Boost Breeze. - continue 30 ml Prosource Plus TID, each supplement provides 100 kcal and 15 grams protein. - will order Ensure Enlive BID, each supplement provides 350 kcal and 20 grams of protein. - complete NFPE when feasible.  - sent secure chat message to MD d/t no BM x5 days.    NUTRITION DIAGNOSIS:   Increased nutrient needs related to acute illness,cancer and cancer related treatments as evidenced by estimated needs. -revised, ongoing  GOAL:   Patient will meet greater than or equal to 90% of their needs -unmet  MONITOR:   PO intake,Supplement acceptance,Labs,Weight trends  ASSESSMENT:   55 y.o. female with medical history of morbid obesity, HTN, HLD, cervical cancer/hysterectomy, iron deficiency anemia, anxiety, osteoarthritis, asthma/COPD, hypothyroidism, insomnia, and migraine headaches. She presented to the ED on 3/7 due to N/V, abdominal pain and bloating, and reported 38 lb weight loss in 2 months. In the ED, FOBT was positive and lipase was elevated. CT abdomen/pelvis showed multiple large liver masses consistent with metastatic disease and an ill-defined mass in the pancreatic head. MRI abdomen also done.  Patient is currently out of the room to Radiation for simulation. No family/visitors are in the room. Diet advanced from NPO to CLD on 3/8 at 59 and to Soft on 3/10 at 1430. She has had several periods of NPO status between 3/8 and today.   From dinner on 3/10 until dinner on 3/13, she was eating 25-50% at meals. No intakes documented yesterday. She consumed 100% of breakfast this AM.  Patient has been requesting Ensure supplements in place of Boost Breeze at times and stating that [place] makes her nauseated, per documentation in the order. She has been accepting Prosource Plus ~90% of the time offered.  Weight yesterday was +9 lb compared to admission (3/7) weight. No  information documented in the edema section of flow sheet.  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 Plan for palliative XRT (simulation today) and for systemic chemo following port placement.    Labs reviewed. Medications reviewed; 510 mg feraheme x1 dose 3/11 and x1 dose 3/18, 5 mg IV reglan QID, 1 tablet multivitamin with minerals/day, 40 mg IV protonix BID.    NUTRITION - FOCUSED PHYSICAL EXAM:  unable to complete at this time.  Diet Order:   Diet Order            DIET SOFT Room service appropriate? Yes; Fluid consistency: Thin  Diet effective now                 EDUCATION NEEDS:   Not appropriate for education at this time  Skin:  Skin Assessment: Reviewed RN Assessment  Last BM:  3/10 (type 7 x1)  Height:   Ht Readings from Last 1 Encounters:  02/16/21 5\' 4"  (1.626 m)    Weight:   Wt Readings from Last 1 Encounters:  02/23/21 122.5 kg    Estimated Nutritional Needs:  Kcal:  2200-2400 kcal Protein:  110-120 grams Fluid:  >/= 2.3 L/day      Jarome Matin, MS, RD, LDN, CNSC Inpatient Clinical Dietitian RD pager # available in [facility]  After hours/weekend pager # available in [facility]

## 2021-02-24 NOTE — Procedures (Signed)
Pre Procedure Dx: Liver lesion Post Procedural Dx: Same  Technically successful US guided biopsy of indeterminate lesion/mass within the dome of the left lobe of the liver.  EBL: None  No immediate complications.   Ronny Bacon, MD Pager #: [phone]

## 2021-02-24 NOTE — Consult Note (Signed)
Chief Complaint: Patient was seen in consultation today for  Chief Complaint  Patient presents with  . Abdominal Pain  . Emesis    Referring Physician(s): Dr. Benay Spice   Supervising Physician: Sandi Mariscal  Patient Status: [facility] - In-pt  History of Present Illness: Wendy Hoffman is a 55 y.o. female with a medical history significant for HTN, COPD, cervical cancer, anxiety and morbid obesity. She presented to the [facility] ED 02/16/21 with complaints of abdominal pain, fatigue, vomiting, constipation and unintentional weight loss of 38 pounds. Imaging obtained showed a pancreatic/duodenal mass with multiple liver masses consistent with metastatic disease. Her lab work was significant for a hemoglobin of 5.8 and heme positive stool. She received a few units of PRBCs and GI was consulted. She underwent flexible sigmoidoscopy 02/19/21 with no acute findings. An upper endoscopy was done the same day and a duodenal mass was discovered and biopsied. Pathology returned positive for adenocarcinoma. Additional imaging was obtained.   MR Abdomen 02/21/21 IMPRESSION: 1. There is a mass of the posterior pancreatic head measuring 4.3 x 3.8 cm with obstruction of the central pancreatic duct and diffuse dilatation up to 6 mm. This is consistent with pancreatic adenocarcinoma. 2. There are faintly contrast enhancing, intrinsically T1 and T2 hyperintense masses of varying sizes throughout the liver, largest mass of the central left lobe measuring 9.9 x 9.2 cm. These are consistent with hepatic metastatic disease. 3. Mild dilatation of the common bile duct, measuring up to 8 mm and tapering to the ampulla. This is likely related to prior cholecystectomy without evidence of overt common bile duct obstruction. 4. Trace bilateral pleural effusions. 5. Hepatomegaly.  Interventional Radiology has been asked to evaluate this patient for an image-guided liver lesion biopsy and an image-guided port-a-catheter  placement.   Past Medical History:  Diagnosis Date  . Anemia   . Anxiety   . Arthritis   . Asthma   . Cancer ([place])    cervic  . H/O: hysterectomy   . High cholesterol   . Hypertension   . Hypothyroidism   . Insomnia   . Medical history non-contributory   . Migraine   . Morbid obesity ([place])   . Shortness of breath   . Stroke ([place])   . Thyroid disease     Past Surgical History:  Procedure Laterality Date  . ABDOMINAL HYSTERECTOMY    . BIOPSY  02/19/2021   Procedure: BIOPSY;  Surgeon: Mauri Pole, MD;  Location: [facility] ENDOSCOPY;  Service: Endoscopy;;  . CESAREAN SECTION     3 occasions  . CHOLECYSTECTOMY    . ESOPHAGOGASTRODUODENOSCOPY (EGD) WITH PROPOFOL N/A 02/19/2021   Procedure: ESOPHAGOGASTRODUODENOSCOPY (EGD) WITH PROPOFOL;  Surgeon: Mauri Pole, MD;  Location: [facility]OS[facility];  Service: Endoscopy;  Laterality: N/A;  . FLEXIBLE SIGMOIDOSCOPY N/A 02/19/2021   Procedure: FLEXIBLE SIGMOIDOSCOPY;  Surgeon: Mauri Pole, MD;  Location: [facility];  Service: Endoscopy;  Laterality: N/A;  . THYROIDECTOMY, PARTIAL     Goiter    Allergies: Blueberry [vaccinium angustifolium], Mango flavor, Aspirin, and Penicillins  Medications: Prior to Admission medications   Medication Sig Start Date End Date Taking? Authorizing Provider  albuterol (PROVENTIL) (2.5 MG/3ML) 0.083% nebulizer solution Take 3 mLs (2.5 mg total) by nebulization every 4 (four) hours as needed for wheezing or shortness of breath. 03/0/09  Yes Delora Fuel, MD  albuterol (VENTOLIN HFA) 108 (90 Base) MCG/ACT inhaler INHALE 1-2 PUFFS INTO THE LUNGS EVERY 6 (SIX) HOURS AS NEEDED FOR WHEEZING OR SHORTNESS OF BREATH.  03/07/20  Yes Charlott Rakes, MD  amLODipine (NORVASC) 10 MG tablet Take 1 tablet (10 mg total) by mouth daily. 11/19/19  Yes Charlott Rakes, MD  baclofen (LIORESAL) 10 MG tablet Take 10 mg by mouth 2 (two) times daily.   Yes [provider]  DEXILANT 60 MG capsule Take 1 capsule  by mouth daily. 09/19/20  Yes [provider]  gabapentin (NEURONTIN) 300 MG capsule Take 300 mg by mouth at bedtime. 01/07/21  Yes [provider]  ipratropium (ATROVENT) 0.02 % nebulizer solution Take 0.25 mg by nebulization daily as needed for wheezing.   Yes [provider]  methocarbamol (ROBAXIN) 750 MG tablet Take 750 mg by mouth 2 (two) times daily.   Yes [provider]  oxyCODONE-acetaminophen (PERCOCET/ROXICET) 5-325 MG tablet Take 1 tablet by mouth every 4 (four) hours as needed for moderate pain. 10/03/20  Yes [provider]  rosuvastatin (CRESTOR) 5 MG tablet Take 5 mg by mouth daily.   Yes [provider]  sucralfate (CARAFATE) 1 GM/10ML suspension Take 10 mLs (1 g total) by mouth 4 (four) times daily -  with meals and at bedtime. 07/21/20  Yes Robinson, [place] N, PA-C  SUMAtriptan (IMITREX) 100 MG tablet Take 100 mg by mouth daily as needed for migraine.   Yes [provider]  SYMBICORT 160-4.5 MCG/ACT inhaler INHALE 2 PUFFS INTO THE LUNGS 2 (TWO) TIMES DAILY. Patient taking differently: Inhale 2 puffs into the lungs 2 (two) times daily. 03/07/20  Yes Newlin, Charlane Ferretti, MD  zolpidem (AMBIEN) 10 MG tablet Take 10 mg by mouth daily.   Yes [provider]  cyclobenzaprine (FLEXERIL) 10 MG tablet TAKE 1 TABLET (10 MG TOTAL) BY MOUTH 3 (THREE) TIMES DAILY AS NEEDED FOR MUSCLE SPASMS. Patient not taking: No sig reported 12/27/19   Charlott Rakes, MD  HYDROcodone-acetaminophen (NORCO/VICODIN) 5-325 MG tablet Take 1 tablet by mouth every 6 (six) hours as needed for severe pain. Patient not taking: No sig reported 07/21/20   Robinson, [place] N, PA-C  omeprazole (PRILOSEC) 20 MG capsule Take 1 capsule (20 mg total) by mouth daily. Patient not taking: No sig reported 07/21/20   Robinson, [place] N, PA-C  ondansetron (ZOFRAN ODT) 4 MG disintegrating tablet Take 1 tablet (4 mg total) by mouth every 8 (eight) hours as needed for nausea  or vomiting. Patient not taking: No sig reported 07/21/20   Robinson, [place] N, PA-C  predniSONE (DELTASONE) 20 MG tablet Take 2 tablets (40 mg total) by mouth daily. Patient not taking: No sig reported 10/08/20   Little, Wenda Overland, MD  topiramate (TOPAMAX) 50 MG tablet Take 1 tablet (50 mg total) by mouth 2 (two) times daily. Patient not taking: No sig reported 11/19/19   Charlott Rakes, MD  fluticasone (FLONASE) 50 MCG/ACT nasal spray Place 1 spray into both nostrils daily. Patient not taking: Reported on 03/21/2019 12/29/17 06/29/19  Petrucelli, Glynda Jaeger, PA-C     Family History  Problem Relation Age of Onset  . Migraines Sister     Social History   Socioeconomic History  . Marital status: Single    Spouse name: Not on file  . Number of children: Not on file  . Years of education: Not on file  . Highest education level: Not on file  Occupational History  . Not on file  Tobacco Use  . Smoking status: Former Smoker    Types: Cigarettes  . Smokeless tobacco: Never Used  Vaping Use  . Vaping Use: Never used  Substance and Sexual Activity  . Alcohol use: No  . Drug use: No  . Sexual activity: Not on file  Other Topics Concern  . Not on file  Social History Narrative  . Not on file   Social Determinants of Health   Financial Resource Strain: Not on file  Food Insecurity: Not on file  Transportation Needs: Not on file  Physical Activity: Not on file  Stress: Not on file  Social Connections: Not on file    Review of Systems: A 12 point ROS discussed and pertinent positives are indicated in the HPI above.  All other systems are negative.  Review of Systems  Constitutional: Positive for appetite change and fatigue.  Respiratory: Negative for cough and shortness of breath.   Cardiovascular: Negative for chest pain and leg swelling.  Gastrointestinal: Positive for abdominal pain and constipation. Negative for nausea and vomiting.  Musculoskeletal: Positive for back  pain.  Neurological: Negative for dizziness and headaches.    Vital Signs: BP 131/87 (BP Location: Right Arm)   Pulse 83   Temp 98.3 F (36.8 C) (Oral)   Resp 16   Ht 5\' 4"  (1.626 m)   Wt 270 lb 1.6 oz (122.5 kg)   SpO2 97%   BMI 46.36 kg/m   Physical Exam Constitutional:      General: She is not in acute distress.    Appearance: She is obese.  HENT:     Mouth/Throat:     Mouth: Mucous membranes are moist.     Pharynx: Oropharynx is clear.  Cardiovascular:     Rate and Rhythm: Normal rate. Rhythm irregular.     Pulses: Normal pulses.     Heart sounds: Normal heart sounds.  Pulmonary:     Effort: Pulmonary effort is normal.     Breath sounds: Normal breath sounds.  Abdominal:     General: Bowel sounds are normal.     Palpations: Abdomen is soft.     Tenderness: There is abdominal tenderness.     Comments: Epigastric pain   Skin:    General: Skin is warm and dry.  Neurological:     Mental Status: She is alert and oriented to person, place, and time.     Imaging: CT ABDOMEN PELVIS WO CONTRAST  Result Date: 02/16/2021 CLINICAL DATA:  Abdominal distension. EXAM: CT ABDOMEN AND PELVIS WITHOUT CONTRAST TECHNIQUE: Multidetector CT imaging of the abdomen and pelvis was performed following the standard protocol without IV contrast. COMPARISON:  None recent FINDINGS: Lower chest: The lung bases are clear.There is a possible filling defect within the right ventricle (axial series 2, image 4). The intracardiac blood pool is hypodense relative to the adjacent myocardium consistent with anemia. Hepatobiliary: Multiple large hypoechoic masses are noted in the patient's liver. The largest is located in the left hepatic lobe measures approximately 10 cm. The gallbladder is not well evaluated.There is no biliary ductal dilation. Pancreas: There is an ill-defined masslike area in the pancreatic head measuring approximately 4.7 cm (axial series 2, image 41). Spleen: Unremarkable.  Adrenals/Urinary Tract: --Adrenal glands: Unremarkable. --Right kidney/ureter: No hydronephrosis or radiopaque kidney stones. --Left kidney/ureter: There is a punctate nonobstructing stone in the upper pole the left kidney. --Urinary bladder: Unremarkable. Stomach/Bowel: --Stomach/Duodenum: No hiatal hernia or other gastric abnormality. Normal duodenal course and caliber. --Small bowel: Unremarkable. --Colon: Unremarkable. --Appendix: Normal. Vascular/Lymphatic: Atherosclerotic calcification is present within the non-aneurysmal abdominal aorta, without hemodynamically significant stenosis. --No retroperitoneal lymphadenopathy. --No mesenteric lymphadenopathy. --No pelvic or inguinal lymphadenopathy. Reproductive:  Status post hysterectomy. No adnexal mass. Other: No ascites or free air. The abdominal wall is normal. Musculoskeletal. No acute displaced fractures. IMPRESSION: 1. Multiple large hypoechoic masses in the patient's liver, consistent with metastatic disease. These are amenable to percutaneous biopsy as clinically indicated. 2. Ill-defined masslike area in the pancreatic head measuring approximately 4.7 cm. This is concerning for pancreatic adenocarcinoma. Exam limited by lack of IV contrast. A follow-up contrast enhanced MRI would be useful for further evaluation and staging. 3. Possible filling defect within the right ventricle. Recommend further evaluation with a dedicated cardiac echo. 4. Anemia. 5. Punctate nonobstructing stone in the upper pole of the left kidney. Aortic Atherosclerosis (ICD10-I70.0). Electronically Signed   By: Constance Holster M.D.   On: 02/16/2021 16:04   MR ABDOMEN W WO CONTRAST  Result Date: 02/21/2021 CLINICAL DATA:  Pancreatic cancer, metastatic disease evaluation EXAM: MRI ABDOMEN WITHOUT AND WITH CONTRAST TECHNIQUE: Multiplanar multisequence MR imaging of the abdomen was performed both before and after the administration of intravenous contrast. CONTRAST:  58mL GADAVIST  GADOBUTROL 1 MMOL/ML IV SOLN COMPARISON:  CT abdomen pelvis, 02/16/2021 FINDINGS: Lower chest: Trace bilateral pleural effusions. Hepatobiliary: Hepatomegaly, maximum coronal span 22.4 cm. There are faintly contrast enhancing, intrinsically T1 and T2 hyperintense masses of varying sizes throughout the liver, largest mass of the central left lobe measuring 9.9 x 9.2 cm (series 5, image 12). These are generally isosignal, isoenhancing to pancreatic head mass. Status post cholecystectomy. Mild dilatation of the common bile duct, measuring up to 8 mm and tapering to the ampulla. Pancreas: There is a mass of the posterior pancreatic head measuring 4.3 x 3.8 cm with obstruction of the central pancreatic duct and diffuse dilatation up to 6 mm. Spleen:  Within normal limits in size and appearance. Adrenals/Urinary Tract: No masses identified. No evidence of hydronephrosis. Stomach/Bowel: Visualized portions within the abdomen are unremarkable. Vascular/Lymphatic: No pathologically enlarged lymph nodes identified. No abdominal aortic aneurysm demonstrated. Left portal vein appears to be partially effaced by liver masses. The central portal vein is patent. Other:  None. Musculoskeletal: No suspicious bone lesions identified. IMPRESSION: 1. There is a mass of the posterior pancreatic head measuring 4.3 x 3.8 cm with obstruction of the central pancreatic duct and diffuse dilatation up to 6 mm. This is consistent with pancreatic adenocarcinoma. 2. There are faintly contrast enhancing, intrinsically T1 and T2 hyperintense masses of varying sizes throughout the liver, largest mass of the central left lobe measuring 9.9 x 9.2 cm. These are consistent with hepatic metastatic disease. 3. Mild dilatation of the common bile duct, measuring up to 8 mm and tapering to the ampulla. This is likely related to prior cholecystectomy without evidence of overt common bile duct obstruction. 4. Trace bilateral pleural effusions. 5. Hepatomegaly.  Electronically Signed   By: Eddie Candle M.D.   On: 02/21/2021 13:31   MR 3D Recon At Scanner  Result Date: 02/21/2021 CLINICAL DATA:  Pancreatic cancer, metastatic disease evaluation EXAM: MRI ABDOMEN WITHOUT AND WITH CONTRAST TECHNIQUE: Multiplanar multisequence MR imaging of the abdomen was performed both before and after the administration of intravenous contrast. CONTRAST:  18mL GADAVIST GADOBUTROL 1 MMOL/ML IV SOLN COMPARISON:  CT abdomen pelvis, 02/16/2021 FINDINGS: Lower chest: Trace bilateral pleural effusions. Hepatobiliary: Hepatomegaly, maximum coronal span 22.4 cm. There are faintly contrast enhancing, intrinsically T1 and T2 hyperintense masses of varying sizes throughout the liver, largest mass of the central left lobe measuring 9.9 x 9.2 cm (series 5, image 12). These are generally isosignal, isoenhancing  to pancreatic head mass. Status post cholecystectomy. Mild dilatation of the common bile duct, measuring up to 8 mm and tapering to the ampulla. Pancreas: There is a mass of the posterior pancreatic head measuring 4.3 x 3.8 cm with obstruction of the central pancreatic duct and diffuse dilatation up to 6 mm. Spleen:  Within normal limits in size and appearance. Adrenals/Urinary Tract: No masses identified. No evidence of hydronephrosis. Stomach/Bowel: Visualized portions within the abdomen are unremarkable. Vascular/Lymphatic: No pathologically enlarged lymph nodes identified. No abdominal aortic aneurysm demonstrated. Left portal vein appears to be partially effaced by liver masses. The central portal vein is patent. Other:  None. Musculoskeletal: No suspicious bone lesions identified. IMPRESSION: 1. There is a mass of the posterior pancreatic head measuring 4.3 x 3.8 cm with obstruction of the central pancreatic duct and diffuse dilatation up to 6 mm. This is consistent with pancreatic adenocarcinoma. 2. There are faintly contrast enhancing, intrinsically T1 and T2 hyperintense masses of  varying sizes throughout the liver, largest mass of the central left lobe measuring 9.9 x 9.2 cm. These are consistent with hepatic metastatic disease. 3. Mild dilatation of the common bile duct, measuring up to 8 mm and tapering to the ampulla. This is likely related to prior cholecystectomy without evidence of overt common bile duct obstruction. 4. Trace bilateral pleural effusions. 5. Hepatomegaly. Electronically Signed   By: Eddie Candle M.D.   On: 02/21/2021 13:31   MR CARDIAC MORPHOLOGY W WO CONTRAST  Result Date: 02/21/2021 CLINICAL DATA:  RV filling defect on CT EXAM: CARDIAC MRI TECHNIQUE: The patient was scanned on a 1.5 Tesla Siemens magnet. A dedicated cardiac coil was used. Functional imaging was done using Fiesta sequences. 2,3, and 4 chamber views were done to assess for RWMA's. Modified Simpson's rule using a short axis stack was used to calculate an ejection fraction on a dedicated work Conservation officer, nature. The patient received 10 cc of Gadavist. After 10 minutes inversion recovery sequences were used to assess for infiltration and scar tissue. CONTRAST:  10 cc  of Gadavist FINDINGS: Left ventricle: -Normal size -Normal systolic function -Elevated ECV (36%) -No LGE LV EF:  53% (Normal 56-78%) Absolute volumes: LV EDV: 138mL (Normal 52-141 mL) LV ESV: 22mL (Normal 13-51 mL) LV SV: 2mL (Normal 33-97 mL) CO: 7.4L/min (Normal 2.7-6.0 L/min) Indexed volumes: LV EDV: 25mL/sq-m (Normal 41-81 mL/sq-m) LV ESV: 73mL/sq-m (Normal 12-21 mL/sq-m) LV SV: 51mL/sq-m (Normal 26-56 mL/sq-m) CI: 3.2L/min/sq-m (Normal 1.8-3.8 L/min/sq-m) Right ventricle: -Normal size and systolic function -Mass in mid RV attached to interventricular septum measuring 62mm x 24mm. Mass is heterogenous, with central area of low T1/high T2 and peripheral area of high T1. Dark on EGE but heterogenous area of enhancement on LGE RV EF: 59% (Normal 47-80%) Absolute volumes: RV EDV: 144mL (Normal 58-154 mL) RV ESV: 67mL (Normal  12-68 mL) RV SV: 31mL (Normal 35-98 mL) CO: 7.5L/min (Normal 2.7-6 L/min) Indexed volumes: RV EDV: 68mL/sq-m (Normal 48-87 mL/sq-m) RV ESV: 33mL/sq-m (Normal 11-28 mL/sq-m) RV SV: 71mL/sq-m (Normal 27-57 mL/sq-m) CI: 3.2L/min/sq-m (Normal 1.8-3.8 L/min/sq-m) Left atrium: Mild enlargement Right atrium: Normal size Mitral valve: No regurgitation Aortic valve: No regurgitation Tricuspid valve: No regurgitation Pulmonic valve: No regurgitation Aorta: Normal proximal ascending aorta Pericardium: Normal IMPRESSION: 1. Mass in mid RV attached to interventricular septum measures 61mm x 80mm. Mass appears heterogenous, with area of enhancement on LGE imaging. Given patient with suspected malignancy, mass is concerning for metastatic tumor 2. Normal LV size and systolic function (EF 62%).  No late gadolinium enhancement to suggest myocardial scar 3.  Normal RV size and systolic function (EF 16%) Electronically Signed   By: Oswaldo Milian MD   On: 02/21/2021 01:00   ECHOCARDIOGRAM COMPLETE  Result Date: 02/17/2021    ECHOCARDIOGRAM REPORT   Patient Name:   LEXII WALSH Date of Exam: 02/17/2021 Medical Rec #:  [id number]       Height:       64.0 in Accession #:    [id number]      Weight:       258.1 lb Date of Birth:  [birth year]       BSA:          2.180 m Patient Age:    30 years        BP:           117/77 mmHg Patient Gender: F               HR:           69 bpm. Exam Location:  Inpatient Procedure: 2D Echo, 3D Echo, Cardiac Doppler and Color Doppler Indications:    R94.31 Abnormal EKG  History:        Patient has prior history of Echocardiogram examinations, most                 recent 04/18/2008. COPD, Signs/Symptoms:Chest Pain; Risk                 Factors:Hypertension, Dyslipidemia and Current Smoker.  Sonographer:    Roseanna Rainbow RDCS Referring Phys: [id number] [facility]  Sonographer Comments: Technically difficult study due to poor echo windows and patient is morbidly obese. Image acquisition challenging due to  patient body habitus. IMPRESSIONS  1. Left ventricular ejection fraction, by estimation, is 55 to 60%. The left ventricle has normal function. The left ventricle has no regional wall motion abnormalities. Left ventricular diastolic parameters were normal.  2. Right ventricular systolic function is normal. The right ventricular size is normal.  3. The mitral valve is normal in structure. No evidence of mitral valve regurgitation.  4. The aortic valve is normal in structure. Aortic valve regurgitation is not visualized. FINDINGS  Left Ventricle: Left ventricular ejection fraction, by estimation, is 55 to 60%. The left ventricle has normal function. The left ventricle has no regional wall motion abnormalities. The left ventricular internal cavity size was normal in size. There is  no left ventricular hypertrophy. Left ventricular diastolic parameters were normal. Right Ventricle: The right ventricular size is normal. No increase in right ventricular wall thickness. Right ventricular systolic function is normal. Left Atrium: Left atrial size was normal in size. Right Atrium: Right atrial size was normal in size. Pericardium: There is no evidence of pericardial effusion. Mitral Valve: The mitral valve is normal in structure. No evidence of mitral valve regurgitation. Tricuspid Valve: The tricuspid valve is grossly normal. Tricuspid valve regurgitation is trivial. Aortic Valve: The aortic valve is normal in structure. Aortic valve regurgitation is not visualized. Pulmonic Valve: The pulmonic valve was grossly normal. Pulmonic valve regurgitation is not visualized. Aorta: The aortic root and ascending aorta are structurally normal, with no evidence of dilitation. IAS/Shunts: The atrial septum is grossly normal.  LEFT VENTRICLE PLAX 2D LVIDd:         4.70 cm      Diastology LVIDs:         3.00 cm      LV e' medial:    6.85 cm/s LV PW:  1.30 cm      LV E/e' medial:  13.7 LV IVS:        1.20 cm      LV e' lateral:    9.79 cm/s LVOT diam:     2.00 cm      LV E/e' lateral: 9.6 LV SV:         88 LV SV Index:   40 LVOT Area:     3.14 cm  LV Volumes (MOD) LV vol d, MOD A2C: 97.7 ml LV vol d, MOD A4C: 146.0 ml LV vol s, MOD A2C: 42.0 ml LV vol s, MOD A4C: 46.4 ml LV SV MOD A2C:     55.7 ml LV SV MOD A4C:     146.0 ml LV SV MOD BP:      76.1 ml RIGHT VENTRICLE             IVC RV S prime:     12.80 cm/s  IVC diam: 1.50 cm TAPSE (M-mode): 2.1 cm LEFT ATRIUM             Index       RIGHT ATRIUM           Index LA diam:        3.30 cm 1.51 cm/m  RA Area:     12.00 cm LA Vol (A2C):   46.6 ml 21.37 ml/m RA Volume:   21.60 ml  9.91 ml/m LA Vol (A4C):   29.6 ml 13.58 ml/m LA Biplane Vol: 37.7 ml 17.29 ml/m  AORTIC VALVE LVOT Vmax:   131.00 cm/s LVOT Vmean:  91.900 cm/s LVOT VTI:    0.279 m  AORTA Ao Root diam: 3.70 cm MITRAL VALVE MV Area (PHT): 4.06 cm    SHUNTS MV Decel Time: 187 msec    Systemic VTI:  0.28 m MV E velocity: 93.60 cm/s  Systemic Diam: 2.00 cm MV A velocity: 84.40 cm/s MV E/A ratio:  1.11 Mertie Moores MD Electronically signed by Mertie Moores MD Signature Date/Time: 02/17/2021/3:41:34 PM    Final     Labs:  CBC: Recent Labs    02/20/21 0450 02/21/21 0323 02/23/21 0350 02/24/21 0412  WBC 10.0 9.1 9.5 10.8*  HGB 6.8* 7.2* 7.5* 8.5*  HCT 22.6* 23.8* 25.0* 29.3*  PLT 286 277 292 342    COAGS: Recent Labs    02/20/21 2006  INR 0.9    BMP: Recent Labs    07/21/20 1857 02/16/21 1224 02/20/21 0450 02/21/21 0323 02/23/21 0350 02/24/21 0412  NA 139   < > 140 140 139 138  K 3.7   < > 3.4* 3.0* 3.3* 3.7  CL 108   < > 104 105 102 100  CO2 22   < > 26 25 32 26  GLUCOSE 105*   < > 100* 107* 94 90  BUN 14   < > 9 9 6 7   CALCIUM 9.3   < > 9.1 9.0 9.1 9.3  CREATININE 0.76   < > 0.67 0.65 0.54 0.72  GFRNONAA >60   < > >60 >60 >60 >60  GFRAA >60  --   --   --   --   --    < > = values in this interval not displayed.    LIVER FUNCTION TESTS: Recent Labs    07/21/20 1857 02/16/21 1224  02/16/21 2252 02/17/21 0845  BILITOT 0.3 0.2* 0.5 1.0  AST 14* 29 24 27   ALT 13 20 19 20   ALKPHOS  61 82 83 87  PROT 7.0 7.6 7.0 7.1  ALBUMIN 3.9 3.7 3.3* 3.5    TUMOR MARKERS: No results for input(s): AFPTM, CEA, CA199, CHROMGRNA in the last 8760 hours.  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 Both of these procedures will be done in Interventional Radiology.  Risks and benefits of a liver lesion biopsy were discussed with the patient and/or patient's family including, but not limited to bleeding, infection, damage to adjacent structures or low yield requiring additional tests.  Risks and benefits of image-guided Port-a-catheter placement were discussed with the patient including, but not limited to bleeding, infection, pneumothorax, or fibrin sheath development and need for additional procedures.  All of the questions were answered and there is agreement to proceed. She has been NPO. Labs and vitals have been reviewed. She is not on any blood-thinning medications.   Consent signed and in chart.  Thank you for this interesting consult.  I greatly enjoyed meeting Wendy Hoffman and look forward to participating in their care.  A copy of this report was sent to the requesting provider on this date.  Electronically Signed: Soyla Dryer, AGACNP-BC [phone] 02/24/2021, 2:02 PM   I spent a total of 40 Minutes    in face to face in clinical consultation, greater than 50% of which was counseling/coordinating care for liver lesion biopsy and port-a-catheter placement.

## 2021-02-24 NOTE — Progress Notes (Signed)
IR received requests for image-guided liver lesion biopsy and port-a-catheter placement. Due to scheduling conflicts IR is unable to accommodate these requests while [name] is inpatient. Dr. Benay Spice was notified and he will place outpatient orders for these procedures. The inpatient orders will be deleted.    Wendy Hoffman, [place] [phone] 02/24/2021, 7:54 AM

## 2021-02-24 NOTE — Sedation Documentation (Signed)
Port a cath placement procedure begun. 

## 2021-02-24 NOTE — Progress Notes (Signed)
PROGRESS NOTE  Wendy Hoffman  DOB: 27-Apr-1966  PCP: Patient, No Pcp Per ZOX:[id number]  DOA: 02/16/2021  LOS: 8 days   Chief Complaint  Patient presents with  . Abdominal Pain  . Emesis   Brief narrative: Wendy Hoffman is a 55 y.o. female with PMH significant for morbid obesity, HTN, HLD, cervical cancer/hysterectomy, chronic iron deficiency anemia, anxiety, osteoarthritis, asthma/COPD, hypothyroidism, insomnia, migraine headaches.  Patient presented to the ED on 3/7 with nausea, vomiting, abdominal pain, bloating and 38 pound weight loss in 2 months. In the ED, fecal occult blood test was positive, hemoglobin low at 5.8. CT abdomen/pelvis without contrast showed multiple large hypoechoic masses in the patient's liver consistent with metastatic disease. There is an ill-defined masslike area in the pancreatic head measuring about 4.7 cm.  Admitted to hospitalist service. See below for details.  Subjective: Patient was seen and examined this morning.   Sitting up in bed.  Able to tolerate small amount of soft diet only. No active bleeding. Labs from this morning with hemoglobin level stable.  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 -3/7 CT scan showed pancreatic head mass of 4.7 cm with multiple liver mets -3/10 EGD showed large infiltrative mass with bleeding in the second portion of the duodenum, appears to be arising near the ampulla, partially obstructive with friable mucosa. Pending biopsy report. -MRI 3/12 confirmed the finding and also showed obstruction of the central pancreatic duct with diffuse dilation up to 6 mm consistent with pancreatic adenocarcinoma.  Also confirmed hepatic mets. There is mild dilation of the CBD without obstruction which probably is related to prior cholecystectomy status. -Patient is planned for biopsy of the liver mass as well as Port-A-Cath placement. -Radiation  oncology involved as well. -GI, oncology, IR and radiation oncology consult appreciated. -Able to tolerate soft diet.  No evidence of gastric outlet obstruction so far.  Acute on chronic GI bleeding  Iron deficiency anemia -Hemoglobin on admission was low at 5 on admission. -3 units of PRBC transfused so far.  No evidence of active bleeding at this time.  Hemoglobin trend as below, 8.5 today. -Transfuse to keep hemoglobin more than 7. -IV Feraheme given 3/11. Recent Labs    02/16/21 2252 02/17/21 0845 02/19/21 0431 02/20/21 0450 02/21/21 0323 02/23/21 0350 02/24/21 0412  HGB 5.0*   < > 7.8* 6.8* 7.2* 7.5* 8.5*  MCV  --    < > 78.7* 77.4* 79.6* 78.9* 84.2  VITAMINB12 330  --   --   --   --   --   --   FOLATE 12.0  --   --   --   --   --   --   FERRITIN 7*  --   --   --   --   --   --   TIBC 400  --   --   --   --   --   --   IRON 16*  --   --   --   --   --   --   RETICCTPCT 1.7  --   --   --   --   --   --    < > = values in this interval not displayed.   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 -I discussed with cardiologist Dr. Acie Fredrickson and Dr. Reather Littler.  Underwent cardiac MRI on 3/11.  MRI showed 16 mm x 6 mm  mass in the RV attached to the interventricular septum, concerning for metastatic tumor.   Hypertension -Amlodipine on hold.  Blood pressure stable.  Continue to monitor  High cholesterol/Aortic atherosclerosis -Continue rosuvastatin.  Hypothyroidism -Continue Synthroid  Morbid obesity  -Body mass index is 46.36 kg/m. Patient has been advised to make an attempt to improve diet and exercise patterns to aid in weight loss.  COPD -Supplemental oxygen as needed. -Bronchodilators as needed.  Mobility: Encourage ambulation Code Status:   Code Status: Full Code  Nutritional status: Body mass index is 46.36 kg/m. Nutrition Problem: Inadequate protein intake Etiology: other (see comment) (current diet order) Signs/Symptoms:  other (comment) (CLD does not meet estimated protein need) Diet Order            DIET SOFT Room service appropriate? Yes; Fluid consistency: Thin  Diet effective now                 DVT prophylaxis: SCDs Start: 02/16/21 1723   Antimicrobials:  None Fluid: Not on IV fluid Consultants: GI, oncology, IR, radiation oncology Family Communication:  None at bedside  Status is: Inpatient  Remains inpatient appropriate because: Pending liver biopsy, Port-A-Cath replacement  Dispo: The patient is from: Home              Anticipated d/c is to: Home in 2 to 3 days              Patient currently is not medically stable to d/c.   Difficult to place patient No       Infusions:  . ferumoxytol Stopped (02/20/21 1854)    Scheduled Meds: . (feeding supplement) PROSource Plus  30 mL Oral TID BM  . feeding supplement  1 Container Oral TID BM  . gabapentin  300 mg Oral QHS  . metoCLOPramide (REGLAN) injection  5 mg Intravenous Q6H  . mometasone-formoterol  2 puff Inhalation BID  . multivitamin with minerals  1 tablet Oral Daily  . pantoprazole (PROTONIX) IV  40 mg Intravenous Q12H  . potassium chloride  40 mEq Oral Once  . zolpidem  5 mg Oral QHS    Antimicrobials: Anti-infectives (From admission, onward)   None      PRN meds: albuterol, alum & mag hydroxide-simeth, diphenhydrAMINE, hydrocortisone cream, HYDROmorphone (DILAUDID) injection, HYDROmorphone, iohexol, morphine CONCENTRATE, ondansetron **OR** ondansetron (ZOFRAN) IV   Objective: Vitals:   02/24/21 0423 02/24/21 0831  BP: 131/87   Pulse: 83   Resp: 16   Temp: 98.3 F (36.8 C)   SpO2: 97% 97%    Intake/Output Summary (Last 24 hours) at 02/24/2021 1107 Last data filed at 02/23/2021 2210 Gross per 24 hour  Intake 120 ml  Output 1500 ml  Net -1380 ml   Filed Weights   02/16/21 1214 02/16/21 1913 02/23/21 2200  Weight: 118.4 kg 117.1 kg 122.5 kg   Weight change:  Body mass index is 46.36 kg/m.   Physical  Exam: General exam: Pleasant, middle-aged African-American female.  Intermittent partially controlled pain Skin: No rashes, lesions or ulcers. HEENT: Atraumatic, normocephalic, no obvious bleeding Lungs: Clear to auscultation bilaterally CVS: Regular rate and rhythm, no murmur GI/Abd soft, epigastric tenderness present, nondistended, bowel sound present CNS: Alert, awake, oriented x3. Psychiatry: Depressed, tearful Extremities: No pedal edema, no calf tenderness  Data Review: I have personally reviewed the laboratory data and studies available.  Recent Labs  Lab 02/19/21 0431 02/20/21 0450 02/21/21 0323 02/23/21 0350 02/24/21 0412  WBC 11.1* 10.0 9.1 9.5 10.8*  NEUTROABS 8.0*  7.0 6.5 6.4 8.0*  HGB 7.8* 6.8* 7.2* 7.5* 8.5*  HCT 27.0* 22.6* 23.8* 25.0* 29.3*  MCV 78.7* 77.4* 79.6* 78.9* 84.2  PLT 356 286 277 292 342   Recent Labs  Lab 02/19/21 0431 02/20/21 0450 02/21/21 0323 02/23/21 0350 02/24/21 0412  NA 138 140 140 139 138  K 3.4* 3.4* 3.0* 3.3* 3.7  CL 103 104 105 102 100  CO2 23 26 25  32 26  GLUCOSE 110* 100* 107* 94 90  BUN 13 9 9 6 7   CREATININE 0.87 0.67 0.65 0.54 0.72  CALCIUM 9.4 9.1 9.0 9.1 9.3    F/u labs ordered Unresulted Labs (From admission, onward)          Start     Ordered   02/24/21 0500  CBC with Differential/Platelet  Daily,   R     Question:  Specimen collection method  Answer:  Lab=Lab collect   02/23/21 1200   02/24/21 5974  Basic metabolic panel  Daily,   R     Question:  Specimen collection method  Answer:  Lab=Lab collect   02/23/21 1200          Signed, Terrilee Croak, MD [facility] 02/24/2021

## 2021-02-24 NOTE — Procedures (Signed)
Pre Procedure Dx: Poor venous access Post Procedural Dx: Same  Successful placement of right IJ approach port-a-cath with tip at the superior caval atrial junction. The catheter is ready for immediate use.  Estimated Blood Loss: Minimal  Complications: None immediate.  Jay Clearance Chenault, MD Pager #: [phone]   

## 2021-02-24 NOTE — Progress Notes (Addendum)
HEMATOLOGY-ONCOLOGY PROGRESS NOTE  SUBJECTIVE: Continues to have pain in the epigastric area.  She states that she did not tolerate oral Dilaudid however nursing reports that she vomited after dinner.  Tolerating soft foods.  PHYSICAL EXAMINATION:  Vitals:   02/24/21 0423 02/24/21 0831  BP: 131/87   Pulse: 83   Resp: 16   Temp: 98.3 F (36.8 C)   SpO2: 97% 97%   Filed Weights   02/16/21 1214 02/16/21 1913 02/23/21 2200  Weight: 118.4 kg 117.1 kg 122.5 kg    Intake/Output from previous day: 03/14 0701 - 03/15 0700 In: 120 [P.O.:120] Out: 1500 [Urine:1500]  GENERAL:alert, no distress and comfortable SKIN: skin color, texture, turgor are normal, no rashes or significant lesions LUNGS: clear to auscultation and percussion with normal breathing effort HEART: regular rate & rhythm and no murmurs and no lower extremity edema ABDOMEN: Positive bowel sounds, soft, tenderness over the epigastric area NEURO: alert & oriented x 3 with fluent speech, no focal motor/sensory deficits  LABORATORY DATA:  I have reviewed the data as listed CMP Latest Ref Rng & Units 02/24/2021 02/23/2021 02/21/2021  Glucose 70 - 99 mg/dL 90 94 107(H)  BUN 6 - 20 mg/dL 7 6 9   Creatinine 0.44 - 1.00 mg/dL 0.72 0.54 0.65  Sodium 135 - 145 mmol/L 138 139 140  Potassium 3.5 - 5.1 mmol/L 3.7 3.3(L) 3.0(L)  Chloride 98 - 111 mmol/L 100 102 105  CO2 22 - 32 mmol/L 26 32 25  Calcium 8.9 - 10.3 mg/dL 9.3 9.1 9.0  Total Protein 6.5 - 8.1 g/dL - - -  Total Bilirubin 0.3 - 1.2 mg/dL - - -  Alkaline Phos 38 - 126 U/L - - -  AST 15 - 41 U/L - - -  ALT 0 - 44 U/L - - -    Lab Results  Component Value Date   WBC 10.8 (H) 02/24/2021   HGB 8.5 (L) 02/24/2021   HCT 29.3 (L) 02/24/2021   MCV 84.2 02/24/2021   PLT 342 02/24/2021   NEUTROABS 8.0 (H) 02/24/2021    CT ABDOMEN PELVIS WO CONTRAST  Result Date: 02/16/2021 CLINICAL DATA:  Abdominal distension. EXAM: CT ABDOMEN AND PELVIS WITHOUT CONTRAST TECHNIQUE:  Multidetector CT imaging of the abdomen and pelvis was performed following the standard protocol without IV contrast. COMPARISON:  None recent FINDINGS: Lower chest: The lung bases are clear.There is a possible filling defect within the right ventricle (axial series 2, image 4). The intracardiac blood pool is hypodense relative to the adjacent myocardium consistent with anemia. Hepatobiliary: Multiple large hypoechoic masses are noted in the patient's liver. The largest is located in the left hepatic lobe measures approximately 10 cm. The gallbladder is not well evaluated.There is no biliary ductal dilation. Pancreas: There is an ill-defined masslike area in the pancreatic head measuring approximately 4.7 cm (axial series 2, image 41). Spleen: Unremarkable. Adrenals/Urinary Tract: --Adrenal glands: Unremarkable. --Right kidney/ureter: No hydronephrosis or radiopaque kidney stones. --Left kidney/ureter: There is a punctate nonobstructing stone in the upper pole the left kidney. --Urinary bladder: Unremarkable. Stomach/Bowel: --Stomach/Duodenum: No hiatal hernia or other gastric abnormality. Normal duodenal course and caliber. --Small bowel: Unremarkable. --Colon: Unremarkable. --Appendix: Normal. Vascular/Lymphatic: Atherosclerotic calcification is present within the non-aneurysmal abdominal aorta, without hemodynamically significant stenosis. --No retroperitoneal lymphadenopathy. --No mesenteric lymphadenopathy. --No pelvic or inguinal lymphadenopathy. Reproductive: Status post hysterectomy. No adnexal mass. Other: No ascites or free air. The abdominal wall is normal. Musculoskeletal. No acute displaced fractures. IMPRESSION: 1. Multiple large hypoechoic masses  in the patient's liver, consistent with metastatic disease. These are amenable to percutaneous biopsy as clinically indicated. 2. Ill-defined masslike area in the pancreatic head measuring approximately 4.7 cm. 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 Exam limited by lack of IV contrast. A follow-up contrast enhanced MRI would be useful for further evaluation and staging. 3. Possible filling defect within the right ventricle. Recommend further evaluation with a dedicated cardiac echo. 4. Anemia. 5. Punctate nonobstructing stone in the upper pole of the left kidney. Aortic Atherosclerosis (ICD10-I70.0). Electronically Signed   By: Constance Holster M.D.   On: 02/16/2021 16:04   MR ABDOMEN W WO CONTRAST  Result Date: 02/21/2021 CLINICAL DATA:  Pancreatic cancer, metastatic disease evaluation EXAM: MRI ABDOMEN WITHOUT AND WITH CONTRAST TECHNIQUE: Multiplanar multisequence MR imaging of the abdomen was performed both before and after the administration of intravenous contrast. CONTRAST:  87mL GADAVIST GADOBUTROL 1 MMOL/ML IV SOLN COMPARISON:  CT abdomen pelvis, 02/16/2021 FINDINGS: Lower chest: Trace bilateral pleural effusions. Hepatobiliary: Hepatomegaly, maximum coronal span 22.4 cm. There are faintly contrast enhancing, intrinsically T1 and T2 hyperintense masses of varying sizes throughout the liver, largest mass of the central left lobe measuring 9.9 x 9.2 cm (series 5, image 12). These are generally isosignal, isoenhancing to pancreatic head mass. Status post cholecystectomy. Mild dilatation of the common bile duct, measuring up to 8 mm and tapering to the ampulla. Pancreas: There is a mass of the posterior pancreatic head measuring 4.3 x 3.8 cm with obstruction of the central pancreatic duct and diffuse dilatation up to 6 mm. Spleen:  Within normal limits in size and appearance. Adrenals/Urinary Tract: No masses identified. No evidence of hydronephrosis. Stomach/Bowel: Visualized portions within the abdomen are unremarkable. Vascular/Lymphatic: No pathologically enlarged lymph nodes identified. No abdominal aortic aneurysm demonstrated. Left portal vein appears to be partially effaced by liver masses. The central portal vein is patent.  Other:  None. Musculoskeletal: No suspicious bone lesions identified. IMPRESSION: 1. There is a mass of the posterior pancreatic head measuring 4.3 x 3.8 cm with obstruction of the central pancreatic duct and diffuse dilatation up to 6 mm. This is consistent with pancreatic adenocarcinoma. 2. There are faintly contrast enhancing, intrinsically T1 and T2 hyperintense masses of varying sizes throughout the liver, largest mass of the central left lobe measuring 9.9 x 9.2 cm. These are consistent with hepatic metastatic disease. 3. Mild dilatation of the common bile duct, measuring up to 8 mm and tapering to the ampulla. This is likely related to prior cholecystectomy without evidence of overt common bile duct obstruction. 4. Trace bilateral pleural effusions. 5. Hepatomegaly. Electronically Signed   By: Eddie Candle M.D.   On: 02/21/2021 13:31   MR 3D Recon At Scanner  Result Date: 02/21/2021 CLINICAL DATA:  Pancreatic cancer, metastatic disease evaluation EXAM: MRI ABDOMEN WITHOUT AND WITH CONTRAST TECHNIQUE: Multiplanar multisequence MR imaging of the abdomen was performed both before and after the administration of intravenous contrast. CONTRAST:  12mL GADAVIST GADOBUTROL 1 MMOL/ML IV SOLN COMPARISON:  CT abdomen pelvis, 02/16/2021 FINDINGS: Lower chest: Trace bilateral pleural effusions. Hepatobiliary: Hepatomegaly, maximum coronal span 22.4 cm. There are faintly contrast enhancing, intrinsically T1 and T2 hyperintense masses of varying sizes throughout the liver, largest mass of the central left lobe measuring 9.9 x 9.2 cm (series 5, image 12). These are generally isosignal, isoenhancing to pancreatic head mass. Status post cholecystectomy. Mild dilatation of the common bile duct, measuring up to 8 mm and tapering to the ampulla. Pancreas: There is a  mass of the posterior pancreatic head measuring 4.3 x 3.8 cm with obstruction of the central pancreatic duct and diffuse dilatation up to 6 mm. Spleen:  Within  normal limits in size and appearance. Adrenals/Urinary Tract: No masses identified. No evidence of hydronephrosis. Stomach/Bowel: Visualized portions within the abdomen are unremarkable. Vascular/Lymphatic: No pathologically enlarged lymph nodes identified. No abdominal aortic aneurysm demonstrated. Left portal vein appears to be partially effaced by liver masses. The central portal vein is patent. Other:  None. Musculoskeletal: No suspicious bone lesions identified. IMPRESSION: 1. There is a mass of the posterior pancreatic head measuring 4.3 x 3.8 cm with obstruction of the central pancreatic duct and diffuse dilatation up to 6 mm. This is consistent with pancreatic adenocarcinoma. 2. There are faintly contrast enhancing, intrinsically T1 and T2 hyperintense masses of varying sizes throughout the liver, largest mass of the central left lobe measuring 9.9 x 9.2 cm. These are consistent with hepatic metastatic disease. 3. Mild dilatation of the common bile duct, measuring up to 8 mm and tapering to the ampulla. This is likely related to prior cholecystectomy without evidence of overt common bile duct obstruction. 4. Trace bilateral pleural effusions. 5. Hepatomegaly. Electronically Signed   By: Eddie Candle M.D.   On: 02/21/2021 13:31   MR CARDIAC MORPHOLOGY W WO CONTRAST  Result Date: 02/21/2021 CLINICAL DATA:  RV filling defect on CT EXAM: CARDIAC MRI TECHNIQUE: The patient was scanned on a 1.5 Tesla Siemens magnet. A dedicated cardiac coil was used. Functional imaging was done using Fiesta sequences. 2,3, and 4 chamber views were done to assess for RWMA's. Modified Simpson's rule using a short axis stack was used to calculate an ejection fraction on a dedicated work Conservation officer, nature. The patient received 10 cc of Gadavist. After 10 minutes inversion recovery sequences were used to assess for infiltration and scar tissue. CONTRAST:  10 cc  of Gadavist FINDINGS: Left ventricle: -Normal size -Normal  systolic function -Elevated ECV (36%) -No LGE LV EF:  53% (Normal 56-78%) Absolute volumes: LV EDV: 181mL (Normal 52-141 mL) LV ESV: 32mL (Normal 13-51 mL) LV SV: 62mL (Normal 33-97 mL) CO: 7.4L/min (Normal 2.7-6.0 L/min) Indexed volumes: LV EDV: 32mL/sq-m (Normal 41-81 mL/sq-m) LV ESV: 34mL/sq-m (Normal 12-21 mL/sq-m) LV SV: 46mL/sq-m (Normal 26-56 mL/sq-m) CI: 3.2L/min/sq-m (Normal 1.8-3.8 L/min/sq-m) Right ventricle: -Normal size and systolic function -Mass in mid RV attached to interventricular septum measuring 73mm x 2mm. Mass is heterogenous, with central area of low T1/high T2 and peripheral area of high T1. Dark on EGE but heterogenous area of enhancement on LGE RV EF: 59% (Normal 47-80%) Absolute volumes: RV EDV: 137mL (Normal 58-154 mL) RV ESV: 42mL (Normal 12-68 mL) RV SV: 52mL (Normal 35-98 mL) CO: 7.5L/min (Normal 2.7-6 L/min) Indexed volumes: RV EDV: 54mL/sq-m (Normal 48-87 mL/sq-m) RV ESV: 25mL/sq-m (Normal 11-28 mL/sq-m) RV SV: 33mL/sq-m (Normal 27-57 mL/sq-m) CI: 3.2L/min/sq-m (Normal 1.8-3.8 L/min/sq-m) Left atrium: Mild enlargement Right atrium: Normal size Mitral valve: No regurgitation Aortic valve: No regurgitation Tricuspid valve: No regurgitation Pulmonic valve: No regurgitation Aorta: Normal proximal ascending aorta Pericardium: Normal IMPRESSION: 1. Mass in mid RV attached to interventricular septum measures 32mm x 75mm. Mass appears heterogenous, with area of enhancement on LGE imaging. Given patient with suspected malignancy, mass is concerning for metastatic tumor 2. Normal LV size and systolic function (EF 73%). No late gadolinium enhancement to suggest myocardial scar 3.  Normal RV size and systolic function (EF 22%) Electronically Signed   By: Oswaldo Milian MD  On: 02/21/2021 01:00   ECHOCARDIOGRAM COMPLETE  Result Date: 02/17/2021    ECHOCARDIOGRAM REPORT   Patient Name:   KERSTEN SALMONS Date of Exam: 02/17/2021 Medical Rec #:  [id number]       Height:       64.0 in  Accession #:    [id number]      Weight:       258.1 lb Date of Birth:  [birth year]       BSA:          2.180 m Patient Age:    25 years        BP:           117/77 mmHg Patient Gender: F               HR:           69 bpm. Exam Location:  Inpatient Procedure: 2D Echo, 3D Echo, Cardiac Doppler and Color Doppler Indications:    R94.31 Abnormal EKG  History:        Patient has prior history of Echocardiogram examinations, most                 recent 04/18/2008. COPD, Signs/Symptoms:Chest Pain; Risk                 Factors:Hypertension, Dyslipidemia and Current Smoker.  Sonographer:    Roseanna Rainbow RDCS Referring Phys: [id number] [facility]  Sonographer Comments: Technically difficult study due to poor echo windows and patient is morbidly obese. Image acquisition challenging due to patient body habitus. IMPRESSIONS  1. Left ventricular ejection fraction, by estimation, is 55 to 60%. The left ventricle has normal function. The left ventricle has no regional wall motion abnormalities. Left ventricular diastolic parameters were normal.  2. Right ventricular systolic function is normal. The right ventricular size is normal.  3. The mitral valve is normal in structure. No evidence of mitral valve regurgitation.  4. The aortic valve is normal in structure. Aortic valve regurgitation is not visualized. FINDINGS  Left Ventricle: Left ventricular ejection fraction, by estimation, is 55 to 60%. The left ventricle has normal function. The left ventricle has no regional wall motion abnormalities. The left ventricular internal cavity size was normal in size. There is  no left ventricular hypertrophy. Left ventricular diastolic parameters were normal. Right Ventricle: The right ventricular size is normal. No increase in right ventricular wall thickness. Right ventricular systolic function is normal. Left Atrium: Left atrial size was normal in size. Right Atrium: Right atrial size was normal in size. Pericardium: There is no evidence of  pericardial effusion. Mitral Valve: The mitral valve is normal in structure. No evidence of mitral valve regurgitation. Tricuspid Valve: The tricuspid valve is grossly normal. Tricuspid valve regurgitation is trivial. Aortic Valve: The aortic valve is normal in structure. Aortic valve regurgitation is not visualized. Pulmonic Valve: The pulmonic valve was grossly normal. Pulmonic valve regurgitation is not visualized. Aorta: The aortic root and ascending aorta are structurally normal, with no evidence of dilitation. IAS/Shunts: The atrial septum is grossly normal.  LEFT VENTRICLE PLAX 2D LVIDd:         4.70 cm      Diastology LVIDs:         3.00 cm      LV e' medial:    6.85 cm/s LV PW:         1.30 cm      LV E/e' medial:  13.7 LV IVS:  1.20 cm      LV e' lateral:   9.79 cm/s LVOT diam:     2.00 cm      LV E/e' lateral: 9.6 LV SV:         88 LV SV Index:   40 LVOT Area:     3.14 cm  LV Volumes (MOD) LV vol d, MOD A2C: 97.7 ml LV vol d, MOD A4C: 146.0 ml LV vol s, MOD A2C: 42.0 ml LV vol s, MOD A4C: 46.4 ml LV SV MOD A2C:     55.7 ml LV SV MOD A4C:     146.0 ml LV SV MOD BP:      76.1 ml RIGHT VENTRICLE             IVC RV S prime:     12.80 cm/s  IVC diam: 1.50 cm TAPSE (M-mode): 2.1 cm LEFT ATRIUM             Index       RIGHT ATRIUM           Index LA diam:        3.30 cm 1.51 cm/m  RA Area:     12.00 cm LA Vol (A2C):   46.6 ml 21.37 ml/m RA Volume:   21.60 ml  9.91 ml/m LA Vol (A4C):   29.6 ml 13.58 ml/m LA Biplane Vol: 37.7 ml 17.29 ml/m  AORTIC VALVE LVOT Vmax:   131.00 cm/s LVOT Vmean:  91.900 cm/s LVOT VTI:    0.279 m  AORTA Ao Root diam: 3.70 cm MITRAL VALVE MV Area (PHT): 4.06 cm    SHUNTS MV Decel Time: 187 msec    Systemic VTI:  0.28 m MV E velocity: 93.60 cm/s  Systemic Diam: 2.00 cm MV A velocity: 84.40 cm/s MV E/A ratio:  1.11 Mertie Moores MD Electronically signed by Mertie Moores MD Signature Date/Time: 02/17/2021/3:41:34 PM    Final     ASSESSMENT AND PLAN: 1.    Pancreatic/duodenal  mass, biopsy pending -CT abdomen/pelvis without contrast 02/16/2021-multiple large hypoechoic masses in the patient's liver consistent metastatic disease, ill-defined masslike area in the pancreatic head measuring approximate 4.7 cm, possible filling defect in the right ventricle. -EGD 02/19/2021-large infiltrative mass with bleeding in the second portion of the duodenum, appears to be arising near the ampulla, partially obstructive with friable mucosa. -Flexible sigmoidoscopy 02/19/2021-diverticulosis in the sigmoid colon, nonbleeding external and internal hemorrhoids -Cardiac CT 02/20/2021-mass in the mid RV attached to the interventricular septum measuring 16 mm x 6 mm and concerning for metastatic tumor -MRI abdomen with/without contrast 02/21/2021-mass in the posterior pancreatic head measuring 4.3 x 3.8 cm with obstruction of the central pancreatic duct and diffuse dilatation up to 6 mm consistent with pancreatic adenocarcinoma, liver lesions the largest mass of the central left lobe measuring 9.9 x 9.2 cm consistent with hepatic metastatic disease, hepatomegaly. 2.  Iron deficiency anemia -Feraheme 510 mg 02/20/2021 3.  Protein calorie malnutrition 4.  Possible right ventricular filling defect on noncontrast CT scan 5.  Hypertension 6.  History of CVA 7.  Hyperlipidemia 8.  Hypothyroidism 9.  Morbid obesity 10.  Asthma 11.  Migraines  Ms. Yohe appears stable.  Biopsy results consistent with adenocarcinoma.  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  CEA elevated at 68.8 and CA 19.9 elevated at 17,515. She is aware that no therapy will be curative.  We will consider her for systemic chemotherapy as an outpatient.  Liver biopsy has been ordered  to performed by IR.  Unclear how soon this can be performed.  Tissue needed to send off for additional studies.  Additionally, Port-A-Cath placement by IR is also been requested.  She received a dose of Feraheme 510 mg IV  on 02/20/2021.  Hemoglobin improved.  We will consider a repeat dose of Feraheme as an outpatient.  Pain is not fully controlled.  She states that she did not tolerate oral Dilaudid.  Will try her on Roxanol 10 mg every 4 hours as needed for pain.  Continue to monitor nutrition closely.  She is currently tolerating a soft diet.  Radiation oncology has evaluated the patient is considering a short course of palliative radiation to her duodenal mass.  Her case is to be reviewed in GI tumor conference tomorrow morning.  Recommendations: 1.  Request for liver biopsy and Port-A-Cath placement have been ordered.  Patient prefers to have these performed as an inpatient. 2.  Case to be discussed at GI tumor conference 3/16. 3.  Radiation oncology to proceed with CT simulation today. 4.  Roxanol 10 mg every 4 hours as needed for pain.  ADDENDUM: IR unable to accommodate liver biopsy and PAC placement this week. According to IR "the earliest we could bring this patient down for a liver biopsy and port-a-cath placement would be 03/04/21 around 11 am."     LOS: 8 days   Mikey Bussing, DNP, AGPCNP-BC, AOCNP 02/24/21  Ms. Schmuck was interviewed and examined.  The duodenal biopsy returned as adenocarcinoma.  The CA 19-9 is markedly elevated.  The clinical presentation is consistent with a diagnosis of metastatic pancreas cancer.  The plan is to begin systemic therapy as soon as a Port-A-Cath is placed.  I think it is reasonable to proceed with a course of palliative radiation as it appears systemic chemotherapy will not start for 1-2 weeks.  The goal of radiation is to palliate pain and nausea.  We adjusted the narcotic pain regimen today.  She can be discharged to home if she is tolerating a diet.  We will need to consider placement of a feeding tube or duodenal stent if she is unable to tolerate liquids.  Hopefully the GI symptoms will improve with the radiation and systemic therapy.  I was present  for greater than 50% of today's visit.  I performed medical decision making.  Julieanne Manson, MD

## 2021-02-24 NOTE — Plan of Care (Signed)
  Problem: Nutrition: Goal: Adequate nutrition will be maintained Outcome: Not Progressing   

## 2021-02-25 ENCOUNTER — Ambulatory Visit: Payer: Medicaid Other | Admitting: Radiation Oncology

## 2021-02-25 ENCOUNTER — Other Ambulatory Visit: Payer: Self-pay

## 2021-02-25 ENCOUNTER — Other Ambulatory Visit: Payer: Self-pay | Admitting: Nurse Practitioner

## 2021-02-25 ENCOUNTER — Telehealth: Payer: Self-pay | Admitting: Radiation Oncology

## 2021-02-25 ENCOUNTER — Ambulatory Visit
Admit: 2021-02-25 | Discharge: 2021-02-25 | Disposition: A | Discharge status: Home / Self Care | Payer: Medicaid Other | Attending: Radiation Oncology | Admitting: Radiation Oncology

## 2021-02-25 DIAGNOSIS — C250 Malignant neoplasm of head of pancreas: Secondary | ICD-10-CM

## 2021-02-25 DIAGNOSIS — C259 Malignant neoplasm of pancreas, unspecified: Secondary | ICD-10-CM

## 2021-02-25 LAB — CBC WITH DIFFERENTIAL/PLATELET
Abs Immature Granulocytes: 0.09 10*3/uL — ABNORMAL HIGH (ref 0.00–0.07)
Basophils Absolute: 0 10*3/uL (ref 0.0–0.1)
Basophils Relative: 0 %
Eosinophils Absolute: 0.6 10*3/uL — ABNORMAL HIGH (ref 0.0–0.5)
Eosinophils Relative: 6 %
HCT: 26.2 % — ABNORMAL LOW (ref 36.0–46.0)
Hemoglobin: 7.5 g/dL — ABNORMAL LOW (ref 12.0–15.0)
Immature Granulocytes: 1 %
Lymphocytes Relative: 14 %
Lymphs Abs: 1.4 10*3/uL (ref 0.7–4.0)
MCH: 24 pg — ABNORMAL LOW (ref 26.0–34.0)
MCHC: 28.6 g/dL — ABNORMAL LOW (ref 30.0–36.0)
MCV: 83.7 fL (ref 80.0–100.0)
Monocytes Absolute: 0.5 10*3/uL (ref 0.1–1.0)
Monocytes Relative: 5 %
Neutro Abs: 7.2 10*3/uL (ref 1.7–7.7)
Neutrophils Relative %: 74 %
Platelets: 303 10*3/uL (ref 150–400)
RBC: 3.13 MIL/uL — ABNORMAL LOW (ref 3.87–5.11)
RDW: 27.3 % — ABNORMAL HIGH (ref 11.5–15.5)
WBC: 9.8 10*3/uL (ref 4.0–10.5)
nRBC: 0 % (ref 0.0–0.2)

## 2021-02-25 LAB — BASIC METABOLIC PANEL
Anion gap: 10 (ref 5–15)
BUN: 8 mg/dL (ref 6–20)
CO2: 27 mmol/L (ref 22–32)
Calcium: 9.3 mg/dL (ref 8.9–10.3)
Chloride: 103 mmol/L (ref 98–111)
Creatinine, Ser: 0.66 mg/dL (ref 0.44–1.00)
GFR, Estimated: >60 mL/min (ref >60)
Glucose, Bld: 105 mg/dL — ABNORMAL HIGH (ref 70–99)
Potassium: 3.7 mmol/L (ref 3.5–5.1)
Sodium: 140 mmol/L (ref 135–145)

## 2021-02-25 MED ORDER — SODIUM CHLORIDE 0.9% FLUSH
10.0000 mL | INTRAVENOUS | Status: DC | PRN
  Administered 2021-02-25 – 2021-02-26 (×2): 10 mL

## 2021-02-25 MED ORDER — SODIUM CHLORIDE 0.9% FLUSH
10.0000 mL | Freq: Two times a day (BID) | INTRAVENOUS | Status: DC
  Administered 2021-02-25 – 2021-02-26 (×2): 10 mL

## 2021-02-25 MED ORDER — MORPHINE SULFATE (CONCENTRATE) 10 MG/0.5ML PO SOLN
20.0000 mg | ORAL | Status: DC | PRN
  Administered 2021-02-25 – 2021-02-28 (×9): 20 mg via ORAL
  Filled 2021-02-25 (×11): qty 1

## 2021-02-25 MED ORDER — CHLORHEXIDINE GLUCONATE CLOTH 2 % EX PADS
6.0000 | MEDICATED_PAD | Freq: Every day | CUTANEOUS | Status: DC
  Administered 2021-02-25 – 2021-02-28 (×4): 6 via TOPICAL

## 2021-02-25 MED ORDER — HYDROMORPHONE HCL 1 MG/ML IJ SOLN
1.0000 mg | Freq: Four times a day (QID) | INTRAMUSCULAR | Status: DC | PRN
  Administered 2021-02-25 – 2021-02-26 (×3): 1 mg via INTRAVENOUS
  Filled 2021-02-25 (×3): qty 1

## 2021-02-25 NOTE — Progress Notes (Signed)
PROGRESS NOTE  Wendy Hoffman  DOB: 1966/02/17  PCP: Patient, No Pcp Per BOF:[id number]  DOA: 02/16/2021  LOS: 9 days   Chief Complaint  Patient presents with  . Abdominal Pain  . Emesis   Brief narrative: Wendy Hoffman is a 55 y.o. female with PMH significant for morbid obesity, HTN, HLD, cervical cancer/hysterectomy, chronic iron deficiency anemia, anxiety, osteoarthritis, asthma/COPD, hypothyroidism, insomnia, migraine headaches.  Patient presented to the ED on 3/7 with nausea, vomiting, abdominal pain, bloating and 38 pound weight loss in 2 months. In the ED, fecal occult blood test was positive, hemoglobin low at 5.8. CT abdomen/pelvis without contrast showed multiple large hypoechoic masses in the patient's liver consistent with metastatic disease. There is an ill-defined masslike area in the pancreatic head measuring about 4.7 cm.  Admitted to hospitalist service. See below for details.  Subjective: Patient was seen and examined this morning.   Sitting up in bed.  Able to tolerate small amount of soft diet only. No active bleeding. Labs from this morning with hemoglobin level stable.  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 -3/7 CT scan showed pancreatic head mass of 4.7 cm with multiple liver mets -3/10 EGD showed large infiltrative mass with bleeding in the second portion of the duodenum, arising near the ampulla, partially obstructive with friable mucosa.  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 -MRI 3/12 confirmed the finding and also showed obstruction of the central pancreatic duct with diffuse dilation up to 6 mm consistent with pancreatic adenocarcinoma.  Also confirmed hepatic mets. There is mild dilation of the CBD without obstruction which probably is related to prior cholecystectomy status. -3/15, patient underwent biopsy of the liver mass as well as  Port-A-Cath placement. -GI, oncology, IR and radiation oncology consult appreciated.  -Planned for chemo and continued on radiation therapy as an outpatient. -Able to tolerate soft diet.  No evidence of gastric outlet obstruction so far. -Continue pain control.  Minimize use of IV pain medicines.  Acute on chronic GI bleeding  Iron deficiency anemia -Hemoglobin on admission was low at 5 on admission. -3 units of PRBC transfused so far.  No evidence of active bleeding at this time.  Hemoglobin trend as below, 7.5 today. -Transfuse to keep hemoglobin more than 7. -IV Feraheme given 3/11. Recent Labs    02/16/21 2252 02/17/21 0845 02/20/21 0450 02/21/21 0323 02/23/21 0350 02/24/21 0412 02/25/21 0506  HGB 5.0*   < > 6.8* 7.2* 7.5* 8.5* 7.5*  MCV  --    < > 77.4* 79.6* 78.9* 84.2 83.7  VITAMINB12 330  --   --   --   --   --   --   FOLATE 12.0  --   --   --   --   --   --   FERRITIN 7*  --   --   --   --   --   --   TIBC 400  --   --   --   --   --   --   IRON 16*  --   --   --   --   --   --   RETICCTPCT 1.7  --   --   --   --   --   --    < > = values in this interval not displayed.   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 -I discussed with cardiologist Dr.  Nahser and Dr. Reather Littler.  Underwent cardiac MRI on 3/11.  MRI showed 16 mm x 6 mm mass in the RV attached to the interventricular septum, concerning for metastatic tumor.   Hypertension -Amlodipine on hold.  Blood pressure stable.  Continue to monitor  High cholesterol/Aortic atherosclerosis -Continue rosuvastatin.  Hypothyroidism -Continue Synthroid  Morbid obesity  -Body mass index is 46.36 kg/m. Patient has been advised to make an attempt to improve diet and exercise patterns to aid in weight loss.  COPD -Supplemental oxygen as needed. -Bronchodilators as needed.  Mobility: Encourage ambulation Code Status:   Code Status: Full Code  Nutritional status: Body mass index is  46.36 kg/m. Nutrition Problem: Increased nutrient needs Etiology: acute illness,cancer and cancer related treatments Signs/Symptoms: estimated needs Diet Order            Diet regular Room service appropriate? Yes; Fluid consistency: Thin  Diet effective now                 DVT prophylaxis: SCDs Start: 02/16/21 1723   Antimicrobials:  None Fluid: Not on IV fluid Consultants: GI, oncology, IR, radiation oncology Family Communication:  None at bedside  Status is: Inpatient  Remains inpatient appropriate because: Pending liver biopsy, Port-A-Cath replacement  Dispo: The patient is from: Home              Anticipated d/c is to: Home in 1 to 2 days after pain control is better              Patient currently is not medically stable to d/c.   Difficult to place patient No       Infusions:  . ferumoxytol Stopped (02/20/21 1854)    Scheduled Meds: . (feeding supplement) PROSource Plus  30 mL Oral TID BM  . Chlorhexidine Gluconate Cloth  6 each Topical Daily  . feeding supplement  237 mL Oral BID BM  . gabapentin  300 mg Oral QHS  . mometasone-formoterol  2 puff Inhalation BID  . multivitamin with minerals  1 tablet Oral Daily  . pantoprazole  40 mg Oral BID  . sodium chloride flush  10-40 mL Intracatheter Q12H  . zolpidem  5 mg Oral QHS    Antimicrobials: Anti-infectives (From admission, onward)   None      PRN meds: albuterol, alum & mag hydroxide-simeth, diphenhydrAMINE, hydrocortisone cream, HYDROmorphone (DILAUDID) injection, iohexol, morphine CONCENTRATE, ondansetron **OR** ondansetron (ZOFRAN) IV, polyethylene glycol, sodium chloride flush   Objective: Vitals:   02/24/21 2020 02/25/21 0511  BP: 133/76 124/87  Pulse: 87 83  Resp: 20 14  Temp: 98.9 F (37.2 C) 98.6 F (37 C)  SpO2: 97% 98%    Intake/Output Summary (Last 24 hours) at 02/25/2021 1309 Last data filed at 02/24/2021 2147 Gross per 24 hour  Intake --  Output 600 ml  Net -600 ml    Filed Weights   02/16/21 1214 02/16/21 1913 02/23/21 2200  Weight: 118.4 kg 117.1 kg 122.5 kg   Weight change:  Body mass index is 46.36 kg/m.   Physical Exam: General exam: Pleasant, middle-aged African-American female.  Intermittent partially controlled pain Skin: No rashes, lesions or ulcers. HEENT: Atraumatic, normocephalic, no obvious bleeding Lungs: Clear to auscultation bilaterally CVS: Regular rate and rhythm, no murmur GI/Abd soft, epigastric tenderness still present nondistended, bowel sound present CNS: Alert, awake, oriented x3. Psychiatry: Depressed, tearful Extremities: No pedal edema, no calf tenderness  Data Review: I have personally reviewed the laboratory data and studies available.  Recent Labs  Lab 02/20/21 0450 02/21/21 0323 02/23/21 0350 02/24/21 0412 02/25/21 0506  WBC 10.0 9.1 9.5 10.8* 9.8  NEUTROABS 7.0 6.5 6.4 8.0* 7.2  HGB 6.8* 7.2* 7.5* 8.5* 7.5*  HCT 22.6* 23.8* 25.0* 29.3* 26.2*  MCV 77.4* 79.6* 78.9* 84.2 83.7  PLT 286 277 292 342 303   Recent Labs  Lab 02/20/21 0450 02/21/21 0323 02/23/21 0350 02/24/21 0412 02/25/21 0506  NA 140 140 139 138 140  K 3.4* 3.0* 3.3* 3.7 3.7  CL 104 105 102 100 103  CO2 26 25 32 26 27  GLUCOSE 100* 107* 94 90 105*  BUN 9 9 6 7 8   CREATININE 0.67 0.65 0.54 0.72 0.66  CALCIUM 9.1 9.0 9.1 9.3 9.3    F/u labs ordered Unresulted Labs (From admission, onward)          Start     Ordered   02/24/21 0500  CBC with Differential/Platelet  Daily,   R     Question:  Specimen collection method  Answer:  Lab=Lab collect   02/23/21 1200   02/24/21 0029  Basic metabolic panel  Daily,   R     Question:  Specimen collection method  Answer:  Lab=Lab collect   02/23/21 1200          Signed, Terrilee Croak, MD [facility] 02/25/2021

## 2021-02-25 NOTE — Progress Notes (Signed)
The proposed treatment discussed in conference is for discussion purposes only and is not a binding recommendation.  The patients have not been physically examined, or presented with their treatment options.  Therefore, final treatment plans cannot be decided.   

## 2021-02-25 NOTE — Telephone Encounter (Signed)
Scheduled appts per 3/16 sch msg. Called pt, no answer. Left vm with appts date and times.

## 2021-02-25 NOTE — Progress Notes (Addendum)
HEMATOLOGY-ONCOLOGY PROGRESS NOTE  SUBJECTIVE: She continues to have pain in the epigastric region.  She was able to tolerate the Roxanol yesterday but did not note improvement in pain.  Zofran helps with the nausea.  She notes no benefit from Reglan and has been refusing this.  She reports a maternal first cousin had pancreas cancer and underwent a Whipple procedure.  PHYSICAL EXAMINATION:  Vitals:   02/24/21 2020 02/25/21 0511  BP: 133/76 124/87  Pulse: 87 83  Resp: 20 14  Temp: 98.9 F (37.2 C) 98.6 F (37 C)  SpO2: 97% 98%   Filed Weights   02/16/21 1214 02/16/21 1913 02/23/21 2200  Weight: 261 lb 1.6 oz (118.4 kg) 258 lb 1.6 oz (117.1 kg) 270 lb 1.6 oz (122.5 kg)    Intake/Output from previous day: 03/15 0701 - 03/16 0700 In: 120 [P.O.:120] Out: 600 [Urine:600]  GENERAL:alert, no distress and comfortable SKIN: skin color, texture, turgor are normal, no rashes or significant lesions ABDOMEN:  soft, tenderness over the epigastric area NEURO: alert & oriented x 3 with fluent speech, no focal motor/sensory deficits Port-A-Cath without erythema.  LABORATORY DATA:  I have reviewed the data as listed CMP Latest Ref Rng & Units 02/25/2021 02/24/2021 02/23/2021  Glucose 70 - 99 mg/dL 105(H) 90 94  BUN 6 - 20 mg/dL '8 7 6  ' Creatinine 0.44 - 1.00 mg/dL 0.66 0.72 0.54  Sodium 135 - 145 mmol/L 140 138 139  Potassium 3.5 - 5.1 mmol/L 3.7 3.7 3.3(L)  Chloride 98 - 111 mmol/L 103 100 102  CO2 22 - 32 mmol/L 27 26 32  Calcium 8.9 - 10.3 mg/dL 9.3 9.3 9.1  Total Protein 6.5 - 8.1 g/dL - - -  Total Bilirubin 0.3 - 1.2 mg/dL - - -  Alkaline Phos 38 - 126 U/L - - -  AST 15 - 41 U/L - - -  ALT 0 - 44 U/L - - -    Lab Results  Component Value Date   WBC 9.8 02/25/2021   HGB 7.5 (L) 02/25/2021   HCT 26.2 (L) 02/25/2021   MCV 83.7 02/25/2021   PLT 303 02/25/2021   NEUTROABS 7.2 02/25/2021    CT ABDOMEN PELVIS WO CONTRAST  Result Date: 02/16/2021 CLINICAL DATA:  Abdominal  distension. EXAM: CT ABDOMEN AND PELVIS WITHOUT CONTRAST TECHNIQUE: Multidetector CT imaging of the abdomen and pelvis was performed following the standard protocol without IV contrast. COMPARISON:  None recent FINDINGS: Lower chest: The lung bases are clear.There is a possible filling defect within the right ventricle (axial series 2, image 4). The intracardiac blood pool is hypodense relative to the adjacent myocardium consistent with anemia. Hepatobiliary: Multiple large hypoechoic masses are noted in the patient's liver. The largest is located in the left hepatic lobe measures approximately 10 cm. The gallbladder is not well evaluated.There is no biliary ductal dilation. Pancreas: There is an ill-defined masslike area in the pancreatic head measuring approximately 4.7 cm (axial series 2, image 41). Spleen: Unremarkable. Adrenals/Urinary Tract: --Adrenal glands: Unremarkable. --Right kidney/ureter: No hydronephrosis or radiopaque kidney stones. --Left kidney/ureter: There is a punctate nonobstructing stone in the upper pole the left kidney. --Urinary bladder: Unremarkable. Stomach/Bowel: --Stomach/Duodenum: No hiatal hernia or other gastric abnormality. Normal duodenal course and caliber. --Small bowel: Unremarkable. --Colon: Unremarkable. --Appendix: Normal. Vascular/Lymphatic: Atherosclerotic calcification is present within the non-aneurysmal abdominal aorta, without hemodynamically significant stenosis. --No retroperitoneal lymphadenopathy. --No mesenteric lymphadenopathy. --No pelvic or inguinal lymphadenopathy. Reproductive: Status post hysterectomy. No adnexal mass. Other: No ascites  or free air. The abdominal wall is normal. Musculoskeletal. No acute displaced fractures. IMPRESSION: 1. Multiple large hypoechoic masses in the patient's liver, consistent with metastatic disease. These are amenable to percutaneous biopsy as clinically indicated. 2. Ill-defined masslike area in the pancreatic head measuring  approximately 4.7 cm. This is concerning for pancreatic adenocarcinoma. Exam limited by lack of IV contrast. A follow-up contrast enhanced MRI would be useful for further evaluation and staging. 3. Possible filling defect within the right ventricle. Recommend further evaluation with a dedicated cardiac echo. 4. Anemia. 5. Punctate nonobstructing stone in the upper pole of the left kidney. Aortic Atherosclerosis (ICD10-I70.0). Electronically Signed   By: Constance Holster M.D.   On: 02/16/2021 16:04   MR ABDOMEN W WO CONTRAST  Result Date: 02/21/2021 CLINICAL DATA:  Pancreatic cancer, metastatic disease evaluation EXAM: MRI ABDOMEN WITHOUT AND WITH CONTRAST TECHNIQUE: Multiplanar multisequence MR imaging of the abdomen was performed both before and after the administration of intravenous contrast. CONTRAST:  83m GADAVIST GADOBUTROL 1 MMOL/ML IV SOLN COMPARISON:  CT abdomen pelvis, 02/16/2021 FINDINGS: Lower chest: Trace bilateral pleural effusions. Hepatobiliary: Hepatomegaly, maximum coronal span 22.4 cm. There are faintly contrast enhancing, intrinsically T1 and T2 hyperintense masses of varying sizes throughout the liver, largest mass of the central left lobe measuring 9.9 x 9.2 cm (series 5, image 12). These are generally isosignal, isoenhancing to pancreatic head mass. Status post cholecystectomy. Mild dilatation of the common bile duct, measuring up to 8 mm and tapering to the ampulla. Pancreas: There is a mass of the posterior pancreatic head measuring 4.3 x 3.8 cm with obstruction of the central pancreatic duct and diffuse dilatation up to 6 mm. Spleen:  Within normal limits in size and appearance. Adrenals/Urinary Tract: No masses identified. No evidence of hydronephrosis. Stomach/Bowel: Visualized portions within the abdomen are unremarkable. Vascular/Lymphatic: No pathologically enlarged lymph nodes identified. No abdominal aortic aneurysm demonstrated. Left portal vein appears to be partially  effaced by liver masses. The central portal vein is patent. Other:  None. Musculoskeletal: No suspicious bone lesions identified. IMPRESSION: 1. There is a mass of the posterior pancreatic head measuring 4.3 x 3.8 cm with obstruction of the central pancreatic duct and diffuse dilatation up to 6 mm. This is consistent with pancreatic adenocarcinoma. 2. There are faintly contrast enhancing, intrinsically T1 and T2 hyperintense masses of varying sizes throughout the liver, largest mass of the central left lobe measuring 9.9 x 9.2 cm. These are consistent with hepatic metastatic disease. 3. Mild dilatation of the common bile duct, measuring up to 8 mm and tapering to the ampulla. This is likely related to prior cholecystectomy without evidence of overt common bile duct obstruction. 4. Trace bilateral pleural effusions. 5. Hepatomegaly. Electronically Signed   By: AEddie CandleM.D.   On: 02/21/2021 13:31   MR 3D Recon At Scanner  Result Date: 02/21/2021 CLINICAL DATA:  Pancreatic cancer, metastatic disease evaluation EXAM: MRI ABDOMEN WITHOUT AND WITH CONTRAST TECHNIQUE: Multiplanar multisequence MR imaging of the abdomen was performed both before and after the administration of intravenous contrast. CONTRAST:  158mGADAVIST GADOBUTROL 1 MMOL/ML IV SOLN COMPARISON:  CT abdomen pelvis, 02/16/2021 FINDINGS: Lower chest: Trace bilateral pleural effusions. Hepatobiliary: Hepatomegaly, maximum coronal span 22.4 cm. There are faintly contrast enhancing, intrinsically T1 and T2 hyperintense masses of varying sizes throughout the liver, largest mass of the central left lobe measuring 9.9 x 9.2 cm (series 5, image 12). These are generally isosignal, isoenhancing to pancreatic head mass. Status post cholecystectomy. Mild dilatation  of the common bile duct, measuring up to 8 mm and tapering to the ampulla. Pancreas: There is a mass of the posterior pancreatic head measuring 4.3 x 3.8 cm with obstruction of the central  pancreatic duct and diffuse dilatation up to 6 mm. Spleen:  Within normal limits in size and appearance. Adrenals/Urinary Tract: No masses identified. No evidence of hydronephrosis. Stomach/Bowel: Visualized portions within the abdomen are unremarkable. Vascular/Lymphatic: No pathologically enlarged lymph nodes identified. No abdominal aortic aneurysm demonstrated. Left portal vein appears to be partially effaced by liver masses. The central portal vein is patent. Other:  None. Musculoskeletal: No suspicious bone lesions identified. IMPRESSION: 1. There is a mass of the posterior pancreatic head measuring 4.3 x 3.8 cm with obstruction of the central pancreatic duct and diffuse dilatation up to 6 mm. This is consistent with pancreatic adenocarcinoma. 2. There are faintly contrast enhancing, intrinsically T1 and T2 hyperintense masses of varying sizes throughout the liver, largest mass of the central left lobe measuring 9.9 x 9.2 cm. These are consistent with hepatic metastatic disease. 3. Mild dilatation of the common bile duct, measuring up to 8 mm and tapering to the ampulla. This is likely related to prior cholecystectomy without evidence of overt common bile duct obstruction. 4. Trace bilateral pleural effusions. 5. Hepatomegaly. Electronically Signed   By: Eddie Candle M.D.   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 Please perform ultrasound-guided liver mass biopsy for tissue diagnostic purposes. Additionally please perform image guided Port a catheter placement for durable intravenous access for the initiation of chemotherapy. EXAM: 1. ULTRASOUND-GUIDED LIVER LESION BIOPSY 2. IMPLANTED PORT A CATH PLACEMENT WITH ULTRASOUND AND FLUOROSCOPIC GUIDANCE COMPARISON:  CT abdomen and pelvis - 02/16/2021; abdominal MRI - 02/21/2021 MEDICATIONS: None ANESTHESIA/SEDATION: Moderate  (conscious) sedation was employed during this procedure. A total of Versed 4 mg and Fentanyl 150 mcg was administered intravenously. Moderate Sedation Time: 50 minutes. The patient's level of consciousness and vital signs were monitored continuously by radiology nursing throughout the procedure under my direct supervision. CONTRAST:  None FLUOROSCOPY TIME:  12 seconds (13 mGy) COMPLICATIONS: None immediate. PROCEDURE: The procedures, risks, benefits, and alternatives were explained to the patient. Questions regarding the procedure were encouraged and answered. The patient understands and consents to the procedure. Sonographic evaluation of the abdomen demonstrates an at least 3.5 x 3.4 cm hypoechoic mass within the dome of the left lobe the liver correlating with the dominant mass seen on preceding abdominal MRI image 31, series 26. The skin overlying the midline of the upper abdomen was prepped and draped in usual sterile fashion. After the overlying soft tissues were anesthetized with 1% lidocaine with epinephrine, a 17 gauge coaxial needle was advanced into the hypoechoic hepatic mass and five 18 gauge core needle biopsies were obtained under direct ultrasound guidance. The coaxial needle was removed following the administration of a Gel-Foam slurry. Postprocedural imaging was obtained. A dressing was applied. As the patient tolerated the procedure well, we then proceeded with placement of the port a catheter. _________________________________________________________ The right neck and chest were prepped with chlorhexidine in a sterile fashion, and a sterile drape was applied covering the operative field. Maximum barrier sterile technique with sterile gowns and gloves were used for the procedure. A timeout was performed prior to the initiation of the procedure. Local anesthesia was provided with 1% lidocaine with epinephrine. After creating a small  venotomy incision, a micropuncture kit was utilized to access the  internal jugular vein. Real-time ultrasound guidance was utilized for vascular access including the acquisition of a permanent ultrasound image documenting patency of the accessed vessel. The microwire was utilized to measure appropriate catheter length. A subcutaneous port pocket was then created along the upper chest wall utilizing a combination of sharp and blunt dissection. The pocket was irrigated with sterile saline. A single lumen "standard sized" power injectable port was chosen for placement. The 8 Fr catheter was tunneled from the port pocket site to the venotomy incision. The port was placed in the pocket. The external catheter was trimmed to appropriate length. At the venotomy, an 8 Fr peel-away sheath was placed over a guidewire under fluoroscopic guidance. The catheter was then placed through the sheath and the sheath was removed. Final catheter positioning was confirmed and documented with a fluoroscopic spot radiograph. The port was accessed with a Huber needle and noted to easily aspirate and flush. The venotomy site was closed with an interrupted 4-0 Vicryl suture. The port pocket incision was closed with interrupted 2-0 Vicryl suture. The skin was opposed with a running subcuticular 4-0 Vicryl suture. Dermabond and Steri-strips were applied to both incisions. Dressings were applied. The Huber needle was then reinserted to allow for immediate in hospitalization usage. The patient tolerated the procedure well without immediate post procedural complication. FINDINGS: Under direct ultrasound guidance, five 18 gauge core needle biopsies were obtained the dominant hypoechoic mass involving the dome liver. After catheter placement, the tip lies within the superior cavoatrial junction. The catheter aspirates and flushes normally and is ready for immediate use. IMPRESSION: 1. Technically successful ultrasound-guided biopsy of dominant hypoechoic mass within the dome of the left lobe of the liver. 2.  Successful placement of a right internal jugular approach power injectable Port-A-Cath. The port a Catheter was left accessed for immediate usage. Electronically Signed   By: Sandi Mariscal M.D.   On: 02/25/2021 09:13   MR CARDIAC MORPHOLOGY W WO CONTRAST  Result Date: 02/21/2021 CLINICAL DATA:  RV filling defect on CT EXAM: CARDIAC MRI TECHNIQUE: The patient was scanned on a 1.5 Tesla Siemens magnet. A dedicated cardiac coil was used. Functional imaging was done using Fiesta sequences. 2,3, and 4 chamber views were done to assess for RWMA's. Modified Simpson's rule using a short axis stack was used to calculate an ejection fraction on a dedicated work Conservation officer, nature. The patient received 10 cc of Gadavist. After 10 minutes inversion recovery sequences were used to assess for infiltration and scar tissue. CONTRAST:  10 cc  of Gadavist FINDINGS: Left ventricle: -Normal size -Normal systolic function -Elevated ECV (36%) -No LGE LV EF:  53% (Normal 56-78%) Absolute volumes: LV EDV: 184m (Normal 52-141 mL) LV ESV: 759m(Normal 13-51 mL) LV SV: 9045mNormal 33-97 mL) CO: 7.4L/min (Normal 2.7-6.0 L/min) Indexed volumes: LV EDV: 30m8m-m (Normal 41-81 mL/sq-m) LV ESV: 34mL18mm (Normal 12-21 mL/sq-m) LV SV: 39mL/48m (Normal 26-56 mL/sq-m) CI: 3.2L/min/sq-m (Normal 1.8-3.8 L/min/sq-m) Right ventricle: -Normal size and systolic function -Mass in mid RV attached to interventricular septum measuring 16mm x61m. Ma32mis heterogenous, with central area of low T1/high T2 and peripheral area of high T1. Dark on EGE but heterogenous area of enhancement on LGE RV EF: 59% (Normal 47-80%) Absolute volumes: RV EDV: 155mL (No45m 58-154 mL) RV ESV: 64mL (Nor35m12-68 mL) RV SV: 91mL (Norm52m5-98 mL) CO: 7.5L/min (Normal 2.7-6 L/min) Indexed volumes: RV EDV: 67mL/sq-m (60mal 48-87  mL/sq-m) RV ESV: 18m/sq-m (Normal 11-28 mL/sq-m) RV SV: 445msq-m (Normal 27-57 mL/sq-m) CI: 3.2L/min/sq-m (Normal 1.8-3.8 L/min/sq-m)  Left atrium: Mild enlargement Right atrium: Normal size Mitral valve: No regurgitation Aortic valve: No regurgitation Tricuspid valve: No regurgitation Pulmonic valve: No regurgitation Aorta: Normal proximal ascending aorta Pericardium: Normal IMPRESSION: 1. Mass in mid RV attached to interventricular septum measures 1633m 6mm72mass appears heterogenous, with area of enhancement on LGE imaging. Given patient with suspected malignancy, mass is concerning for metastatic tumor 2. Normal LV size and systolic function (EF 53%)16%o late gadolinium enhancement to suggest myocardial scar 3.  Normal RV size and systolic function (EF 59%)60%ectronically Signed   By: ChriOswaldo Milian  On: 02/21/2021 01:00   ECHOCARDIOGRAM COMPLETE  Result Date: 02/17/2021    ECHOCARDIOGRAM REPORT   Patient Name:   Wendy Hoffman of Exam: 02/17/2021 Medical Rec #:  [id number]   Height:       64.0 in Accession #:    [id number]  Weight:       258.1 lb Date of Birth:  [birth year]   BSA:          2.180 m Patient Age:    54 y25rs        BP:           117/77 mmHg Patient Gender: F               HR:           69 bpm. Exam Location:  Inpatient Procedure: 2D Echo, 3D Echo, Cardiac Doppler and Color Doppler Indications:    R94.31 Abnormal EKG  History:        Patient has prior history of Echocardiogram examinations, most                 recent 04/18/2008. COPD, Signs/Symptoms:Chest Pain; Risk                 Factors:Hypertension, Dyslipidemia and Current Smoker.  Sonographer:    TinaRoseanna RainbowS Referring Phys: [id number]. Elizabeth Hospitalnographer Comments: Technically difficult study due to poor echo windows and patient is morbidly obese. Image acquisition challenging due to patient body habitus. IMPRESSIONS  1. Left ventricular ejection fraction, by estimation, is 55 to 60%. The left ventricle has normal function. The left ventricle has no regional wall motion abnormalities. Left ventricular diastolic parameters were normal.  2.  Right ventricular systolic function is normal. The right ventricular size is normal.  3. The mitral valve is normal in structure. No evidence of mitral valve regurgitation.  4. The aortic valve is normal in structure. Aortic valve regurgitation is not visualized. FINDINGS  Left Ventricle: Left ventricular ejection fraction, by estimation, is 55 to 60%. The left ventricle has normal function. The left ventricle has no regional wall motion abnormalities. The left ventricular internal cavity size was normal in size. There is  no left ventricular hypertrophy. Left ventricular diastolic parameters were normal. Right Ventricle: The right ventricular size is normal. No increase in right ventricular wall thickness. Right ventricular systolic function is normal. Left Atrium: Left atrial size was normal in size. Right Atrium: Right atrial size was normal in size. Pericardium: There is no evidence of pericardial effusion. Mitral Valve: The mitral valve is normal in structure. No evidence of mitral valve regurgitation. Tricuspid Valve: The tricuspid valve is grossly normal. Tricuspid valve regurgitation is trivial. Aortic Valve: The aortic valve  is normal in structure. Aortic valve regurgitation is not visualized. Pulmonic Valve: The pulmonic valve was grossly normal. Pulmonic valve regurgitation is not visualized. Aorta: The aortic root and ascending aorta are structurally normal, with no evidence of dilitation. IAS/Shunts: The atrial septum is grossly normal.  LEFT VENTRICLE PLAX 2D LVIDd:         4.70 cm      Diastology LVIDs:         3.00 cm      LV e' medial:    6.85 cm/s LV PW:         1.30 cm      LV E/e' medial:  13.7 LV IVS:        1.20 cm      LV e' lateral:   9.79 cm/s LVOT diam:     2.00 cm      LV E/e' lateral: 9.6 LV SV:         88 LV SV Index:   40 LVOT Area:     3.14 cm  LV Volumes (MOD) LV vol d, MOD A2C: 97.7 ml LV vol d, MOD A4C: 146.0 ml LV vol s, MOD A2C: 42.0 ml LV vol s, MOD A4C: 46.4 ml LV SV MOD A2C:      55.7 ml LV SV MOD A4C:     146.0 ml LV SV MOD BP:      76.1 ml RIGHT VENTRICLE             IVC RV S prime:     12.80 cm/s  IVC diam: 1.50 cm TAPSE (M-mode): 2.1 cm LEFT ATRIUM             Index       RIGHT ATRIUM           Index LA diam:        3.30 cm 1.51 cm/m  RA Area:     12.00 cm LA Vol (A2C):   46.6 ml 21.37 ml/m RA Volume:   21.60 ml  9.91 ml/m LA Vol (A4C):   29.6 ml 13.58 ml/m LA Biplane Vol: 37.7 ml 17.29 ml/m  AORTIC VALVE LVOT Vmax:   131.00 cm/s LVOT Vmean:  91.900 cm/s LVOT VTI:    0.279 m  AORTA Ao Root diam: 3.70 cm MITRAL VALVE MV Area (PHT): 4.06 cm    SHUNTS MV Decel Time: 187 msec    Systemic VTI:  0.28 m MV E velocity: 93.60 cm/s  Systemic Diam: 2.00 cm MV A velocity: 84.40 cm/s MV E/A ratio:  1.11 Mertie Moores MD Electronically signed by Mertie Moores MD Signature Date/Time: 02/17/2021/3:41:34 PM    Final    IR IMAGING GUIDED PORT INSERTION  Result Date: 02/25/2021 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 Please perform ultrasound-guided liver mass biopsy for tissue diagnostic purposes. Additionally please perform image guided Port a catheter placement for durable intravenous access for the initiation of chemotherapy. EXAM: 1. ULTRASOUND-GUIDED LIVER LESION BIOPSY 2. IMPLANTED PORT A CATH PLACEMENT WITH ULTRASOUND AND FLUOROSCOPIC GUIDANCE COMPARISON:  CT abdomen and pelvis - 02/16/2021; abdominal MRI - 02/21/2021 MEDICATIONS: None ANESTHESIA/SEDATION: Moderate (conscious) sedation was employed during this procedure. A total of Versed 4 mg and Fentanyl 150 mcg was administered intravenously. Moderate Sedation Time: 50 minutes. The patient's level of consciousness and vital signs were monitored continuously by radiology nursing throughout the procedure under my direct supervision. CONTRAST:  None FLUOROSCOPY TIME:  12 seconds (13 mGy) COMPLICATIONS: None immediate.  PROCEDURE: The procedures, risks, benefits, and alternatives were  explained to the patient. Questions regarding the procedure were encouraged and answered. The patient understands and consents to the procedure. Sonographic evaluation of the abdomen demonstrates an at least 3.5 x 3.4 cm hypoechoic mass within the dome of the left lobe the liver correlating with the dominant mass seen on preceding abdominal MRI image 31, series 26. The skin overlying the midline of the upper abdomen was prepped and draped in usual sterile fashion. After the overlying soft tissues were anesthetized with 1% lidocaine with epinephrine, a 17 gauge coaxial needle was advanced into the hypoechoic hepatic mass and five 18 gauge core needle biopsies were obtained under direct ultrasound guidance. The coaxial needle was removed following the administration of a Gel-Foam slurry. Postprocedural imaging was obtained. A dressing was applied. As the patient tolerated the procedure well, we then proceeded with placement of the port a catheter. _________________________________________________________ The right neck and chest were prepped with chlorhexidine in a sterile fashion, and a sterile drape was applied covering the operative field. Maximum barrier sterile technique with sterile gowns and gloves were used for the procedure. A timeout was performed prior to the initiation of the procedure. Local anesthesia was provided with 1% lidocaine with epinephrine. After creating a small venotomy incision, a micropuncture kit was utilized to access the internal jugular vein. Real-time ultrasound guidance was utilized for vascular access including the acquisition of a permanent ultrasound image documenting patency of the accessed vessel. The microwire was utilized to measure appropriate catheter length. A subcutaneous port pocket was then created along the upper chest wall utilizing a combination of sharp and blunt dissection. The pocket was irrigated with sterile saline. A single lumen "standard sized" power injectable  port was chosen for placement. The 8 Fr catheter was tunneled from the port pocket site to the venotomy incision. The port was placed in the pocket. The external catheter was trimmed to appropriate length. At the venotomy, an 8 Fr peel-away sheath was placed over a guidewire under fluoroscopic guidance. The catheter was then placed through the sheath and the sheath was removed. Final catheter positioning was confirmed and documented with a fluoroscopic spot radiograph. The port was accessed with a Huber needle and noted to easily aspirate and flush. The venotomy site was closed with an interrupted 4-0 Vicryl suture. The port pocket incision was closed with interrupted 2-0 Vicryl suture. The skin was opposed with a running subcuticular 4-0 Vicryl suture. Dermabond and Steri-strips were applied to both incisions. Dressings were applied. The Huber needle was then reinserted to allow for immediate in hospitalization usage. The patient tolerated the procedure well without immediate post procedural complication. FINDINGS: Under direct ultrasound guidance, five 18 gauge core needle biopsies were obtained the dominant hypoechoic mass involving the dome liver. After catheter placement, the tip lies within the superior cavoatrial junction. The catheter aspirates and flushes normally and is ready for immediate use. IMPRESSION: 1. Technically successful ultrasound-guided biopsy of dominant hypoechoic mass within the dome of the left lobe of the liver. 2. Successful placement of a right internal jugular approach power injectable Port-A-Cath. The port a Catheter was left accessed for immediate usage. Electronically Signed   By: Sandi Mariscal M.D.   On: 02/25/2021 09:13    ASSESSMENT AND PLAN: 1.    Pancreatic/duodenal mass, biopsy pending -CT abdomen/pelvis without contrast 02/16/2021-multiple large hypoechoic masses in the patient's liver consistent metastatic disease, ill-defined masslike area in the pancreatic head measuring  approximate 4.7 cm, possible  filling defect in the right ventricle. -EGD 02/19/2021-large infiltrative mass with bleeding in the second portion of the duodenum, appears to be arising near the ampulla, partially obstructive with friable mucosa. -Flexible sigmoidoscopy 02/19/2021-diverticulosis in the sigmoid colon, nonbleeding external and internal hemorrhoids -Cardiac CT 02/20/2021-mass in the mid RV attached to the interventricular septum measuring 16 mm x 6 mm and concerning for metastatic tumor -MRI abdomen with/without contrast 02/21/2021-mass in the posterior pancreatic head measuring 4.3 x 3.8 cm with obstruction of the central pancreatic duct and diffuse dilatation up to 6 mm consistent with pancreatic adenocarcinoma, liver lesions the largest mass of the central left lobe measuring 9.9 x 9.2 cm consistent with hepatic metastatic disease, hepatomegaly. -Biopsy liver lesion 02/24/2021-pathology pending 2.  Iron deficiency anemia -Feraheme 510 mg 02/20/2021 3.  Protein calorie malnutrition 4.  Possible right ventricular filling defect on noncontrast CT scan 5.  Hypertension 6.  History of CVA 7.  Hyperlipidemia 8.  Hypothyroidism 9.  Morbid obesity 10.  Asthma 11.  Migraines 12.  Port-A-Cath placement Interventional Radiology 02/24/2021 13.  Family history of pancreas cancer-maternal cousin  Wendy Hoffman appears stable.  Liver lesion was biopsied yesterday, pathology pending.  Port-A-Cath placed yesterday as well.  Case discussed at the GI tumor conference today.  Plan is for radiation followed by systemic chemotherapy.  Pain continues to be poorly controlled.  She was able to tolerate Roxanol yesterday but did not note significant improvement in pain.  We will increase the dose of Roxanol.  She reports a maternal first cousin has a history of pancreas cancer and underwent a Whipple procedure.  Recommendations: 1.  Adjust dose of Roxanol 2.  Radiation, systemic therapy after completion of  radiation 3.  Discontinue Reglan 4.  Genetics referral 5.  Discontinue daily labs    LOS: 9 days   Ned Card, DNP, AGPCNP-BC, AOCNP 02/25/21  Ms. Bas was interviewed and examined.  She underwent Port-A-Cath placement and biopsy of a liver lesion yesterday.  Her case was presented at the GI tumor conference earlier today.  Review of the imaging studies is consistent with a primary tumor at the pancreas head/uncinate.  Dr. Lisbeth Renshaw recommends a course of palliative radiation in hopes of helping the pain, nausea, and GI bleeding.  He plans a short course of radiation.  This will be followed by systemic therapy, likely FOLFIRINOX.  We adjusted the narcotic regimen today.  Outpatient follow-up will be scheduled at the Cancer center.  We will schedule a genetics counseling referral as an outpatient.  She can be discharged to home if she is tolerating oral liquids and her pain is controlled with an oral narcotic regimen.  I was present for greater than 50% of today's visit.  I performed medical decision making.

## 2021-02-25 NOTE — Plan of Care (Signed)
  Problem: Health Behavior/Discharge Planning: Goal: Ability to manage health-related needs will improve Outcome: Progressing   Problem: Activity: Goal: Risk for activity intolerance will decrease Outcome: Progressing   Problem: Nutrition: Goal: Adequate nutrition will be maintained Outcome: Progressing   Problem: Pain Managment: Goal: General experience of comfort will improve Outcome: Progressing   

## 2021-02-26 ENCOUNTER — Ambulatory Visit
Admit: 2021-02-26 | Discharge: 2021-02-26 | Disposition: A | Discharge status: Home / Self Care | Payer: Medicaid Other | Attending: Radiation Oncology | Admitting: Radiation Oncology

## 2021-02-26 ENCOUNTER — Ambulatory Visit: Payer: Medicaid Other

## 2021-02-26 LAB — CBC WITH DIFFERENTIAL/PLATELET
Abs Immature Granulocytes: 0.05 10*3/uL (ref 0.00–0.07)
Basophils Absolute: 0 10*3/uL (ref 0.0–0.1)
Basophils Relative: 0 %
Eosinophils Absolute: 0.4 10*3/uL (ref 0.0–0.5)
Eosinophils Relative: 5 %
HCT: 23.4 % — ABNORMAL LOW (ref 36.0–46.0)
Hemoglobin: 6.9 g/dL — CL (ref 12.0–15.0)
Immature Granulocytes: 1 %
Lymphocytes Relative: 13 %
Lymphs Abs: 1.3 10*3/uL (ref 0.7–4.0)
MCH: 24.6 pg — ABNORMAL LOW (ref 26.0–34.0)
MCHC: 29.5 g/dL — ABNORMAL LOW (ref 30.0–36.0)
MCV: 83.6 fL (ref 80.0–100.0)
Monocytes Absolute: 0.5 10*3/uL (ref 0.1–1.0)
Monocytes Relative: 6 %
Neutro Abs: 7.1 10*3/uL (ref 1.7–7.7)
Neutrophils Relative %: 75 %
Platelets: 259 10*3/uL (ref 150–400)
RBC: 2.8 MIL/uL — ABNORMAL LOW (ref 3.87–5.11)
RDW: 27.3 % — ABNORMAL HIGH (ref 11.5–15.5)
WBC: 9.4 10*3/uL (ref 4.0–10.5)
nRBC: 0 % (ref 0.0–0.2)

## 2021-02-26 LAB — BASIC METABOLIC PANEL
Anion gap: 7 (ref 5–15)
BUN: 9 mg/dL (ref 6–20)
CO2: 31 mmol/L (ref 22–32)
Calcium: 9.3 mg/dL (ref 8.9–10.3)
Chloride: 104 mmol/L (ref 98–111)
Creatinine, Ser: 0.56 mg/dL (ref 0.44–1.00)
GFR, Estimated: >60 mL/min (ref >60)
Glucose, Bld: 98 mg/dL (ref 70–99)
Potassium: 3.8 mmol/L (ref 3.5–5.1)
Sodium: 142 mmol/L (ref 135–145)

## 2021-02-26 LAB — HEMOGLOBIN AND HEMATOCRIT, BLOOD
HCT: 24.2 % — ABNORMAL LOW (ref 36.0–46.0)
Hemoglobin: 7.1 g/dL — ABNORMAL LOW (ref 12.0–15.0)

## 2021-02-26 LAB — PREPARE RBC (CROSSMATCH)

## 2021-02-26 LAB — SURGICAL PATHOLOGY

## 2021-02-26 MED ORDER — MORPHINE SULFATE ER 30 MG PO TBCR
30.0000 mg | EXTENDED_RELEASE_TABLET | Freq: Two times a day (BID) | ORAL | Status: DC
  Administered 2021-02-26 – 2021-02-28 (×5): 30 mg via ORAL
  Filled 2021-02-26 (×5): qty 1

## 2021-02-26 NOTE — Progress Notes (Signed)
   02/26/21 0510  Provider Notification  Provider Name/Title Asia B. Zierle-Ghosh  Date Provider Notified 02/26/21  Time Provider Notified 0500  Notification Type Page  Notification Reason Critical result  Test performed and critical result Hgb 6.9  Date Critical Result Received 02/26/21  Time Critical Result Received 0455  Provider response See new orders (Lab draw H&H)  Date of Provider Response 02/26/21  Time of Provider Response [phone]

## 2021-02-26 NOTE — TOC Progression Note (Signed)
Transition of Care [facility]) - Progression Note    Patient Details  Name: Day Greb MRN: [id number] Date of Birth: [birth year]  Transition of [facility]) CM/SW Contact  Purcell Mouton, RN Phone Number: 02/26/2021, 12:49 PM  Clinical Narrative:    Pt will discharge home with no HH needs and follow up with Oncology.    Expected Discharge Plan: Home/Self Care Barriers to Discharge: No Barriers Identified  Expected Discharge Plan and Services Expected Discharge Plan: Home/Self Care   Discharge Planning Services: CM Consult   Living arrangements for the past 2 months: Single Family Home                                       Social Determinants of Health (SDOH) Interventions    Readmission Risk Interventions No flowsheet data found.

## 2021-02-26 NOTE — Progress Notes (Addendum)
HEMATOLOGY-ONCOLOGY PROGRESS NOTE  SUBJECTIVE: Continues to have pain in the epigastric area but overall improved with addition of liquid morphine.  Reports mild nausea but no vomiting.  PHYSICAL EXAMINATION:  Vitals:   02/25/21 2208 02/26/21 0603  BP: 121/76 125/80  Pulse: (!) 105 87  Resp:    Temp: 99 F (37.2 C) 98.5 F (36.9 C)  SpO2: 99% 97%   Filed Weights   02/16/21 1214 02/16/21 1913 02/23/21 2200  Weight: 118.4 kg 117.1 kg 122.5 kg    Intake/Output from previous day: 03/16 0701 - 03/17 0700 In: 360 [P.O.:360] Out: -   GENERAL:alert, no distress and comfortable SKIN: skin color, texture, turgor are normal, no rashes or significant lesions LUNGS: clear to auscultation and percussion with normal breathing effort HEART: regular rate & rhythm and no murmurs and no lower extremity edema ABDOMEN: Positive bowel sounds, soft, tenderness over the epigastric area NEURO: alert & oriented x 3 with fluent speech, no focal motor/sensory deficits  LABORATORY DATA:  I have reviewed the data as listed CMP Latest Ref Rng & Units 02/26/2021 02/25/2021 02/24/2021  Glucose 70 - 99 mg/dL 98 105(H) 90  BUN 6 - 20 mg/dL _0 Creatinine 0.44 - 1.00 mg/dL 0.56 0.66 0.72  Sodium 135 - 145 mmol/L 142 140 138  Potassium 3.5 - 5.1 mmol/L 3.8 3.7 3.7  Chloride 98 - 111 mmol/L 104 103 100  CO2 22 - 32 mmol/L _1 Calcium 8.9 - 10.3 mg/dL 9.3 9.3 9.3  Total Protein 6.5 - 8.1 g/dL - - -  Total Bilirubin 0.3 - 1.2 mg/dL - - -  Alkaline Phos 38 - 126 U/L - - -  AST 15 - 41 U/L - - -  ALT 0 - 44 U/L - - -    Lab Results  Component Value Date   WBC 9.4 02/26/2021   HGB 7.1 (L) 02/26/2021   HCT 24.2 (L) 02/26/2021   MCV 83.6 02/26/2021   PLT 259 02/26/2021   NEUTROABS 7.1 02/26/2021    CT ABDOMEN PELVIS WO CONTRAST  Result Date: 02/16/2021 CLINICAL DATA:  Abdominal distension. EXAM: CT ABDOMEN AND PELVIS WITHOUT CONTRAST TECHNIQUE: Multidetector CT imaging of the abdomen and  pelvis was performed following the standard protocol without IV contrast. COMPARISON:  None recent FINDINGS: Lower chest: The lung bases are clear.There is a possible filling defect within the right ventricle (axial series 2, image 4). The intracardiac blood pool is hypodense relative to the adjacent myocardium consistent with anemia. Hepatobiliary: Multiple large hypoechoic masses are noted in the patient's liver. The largest is located in the left hepatic lobe measures approximately 10 cm. The gallbladder is not well evaluated.There is no biliary ductal dilation. Pancreas: There is an ill-defined masslike area in the pancreatic head measuring approximately 4.7 cm (axial series 2, image 41). Spleen: Unremarkable. Adrenals/Urinary Tract: --Adrenal glands: Unremarkable. --Right kidney/ureter: No hydronephrosis or radiopaque kidney stones. --Left kidney/ureter: There is a punctate nonobstructing stone in the upper pole the left kidney. --Urinary bladder: Unremarkable. Stomach/Bowel: --Stomach/Duodenum: No hiatal hernia or other gastric abnormality. Normal duodenal course and caliber. --Small bowel: Unremarkable. --Colon: Unremarkable. --Appendix: Normal. Vascular/Lymphatic: Atherosclerotic calcification is present within the non-aneurysmal abdominal aorta, without hemodynamically significant stenosis. --No retroperitoneal lymphadenopathy. --No mesenteric lymphadenopathy. --No pelvic or inguinal lymphadenopathy. Reproductive: Status post hysterectomy. No adnexal mass. Other: No ascites or free air. The abdominal wall is normal. Musculoskeletal. No acute displaced fractures. IMPRESSION: 1. Multiple large hypoechoic masses in the patient's liver, consistent  with metastatic disease. These are amenable to percutaneous biopsy as clinically indicated. 2. Ill-defined masslike area in the pancreatic head measuring approximately 4.7 cm. This is concerning for pancreatic adenocarcinoma. Exam limited by lack of IV contrast. A  follow-up contrast enhanced MRI would be useful for further evaluation and staging. 3. Possible filling defect within the right ventricle. Recommend further evaluation with a dedicated cardiac echo. 4. Anemia. 5. Punctate nonobstructing stone in the upper pole of the left kidney. Aortic Atherosclerosis (ICD10-I70.0). Electronically Signed   By: Constance Holster M.D.   On: 02/16/2021 16:04   MR ABDOMEN W WO CONTRAST  Result Date: 02/21/2021 CLINICAL DATA:  Pancreatic cancer, metastatic disease evaluation EXAM: MRI ABDOMEN WITHOUT AND WITH CONTRAST TECHNIQUE: Multiplanar multisequence MR imaging of the abdomen was performed both before and after the administration of intravenous contrast. CONTRAST:  66m GADAVIST GADOBUTROL 1 MMOL/ML IV SOLN COMPARISON:  CT abdomen pelvis, 02/16/2021 FINDINGS: Lower chest: Trace bilateral pleural effusions. Hepatobiliary: Hepatomegaly, maximum coronal span 22.4 cm. There are faintly contrast enhancing, intrinsically T1 and T2 hyperintense masses of varying sizes throughout the liver, largest mass of the central left lobe measuring 9.9 x 9.2 cm (series 5, image 12). These are generally isosignal, isoenhancing to pancreatic head mass. Status post cholecystectomy. Mild dilatation of the common bile duct, measuring up to 8 mm and tapering to the ampulla. Pancreas: There is a mass of the posterior pancreatic head measuring 4.3 x 3.8 cm with obstruction of the central pancreatic duct and diffuse dilatation up to 6 mm. Spleen:  Within normal limits in size and appearance. Adrenals/Urinary Tract: No masses identified. No evidence of hydronephrosis. Stomach/Bowel: Visualized portions within the abdomen are unremarkable. Vascular/Lymphatic: No pathologically enlarged lymph nodes identified. No abdominal aortic aneurysm demonstrated. Left portal vein appears to be partially effaced by liver masses. The central portal vein is patent. Other:  None. Musculoskeletal: No suspicious bone lesions  identified. IMPRESSION: 1. There is a mass of the posterior pancreatic head measuring 4.3 x 3.8 cm with obstruction of the central pancreatic duct and diffuse dilatation up to 6 mm. This is consistent with pancreatic adenocarcinoma. 2. There are faintly contrast enhancing, intrinsically T1 and T2 hyperintense masses of varying sizes throughout the liver, largest mass of the central left lobe measuring 9.9 x 9.2 cm. These are consistent with hepatic metastatic disease. 3. Mild dilatation of the common bile duct, measuring up to 8 mm and tapering to the ampulla. This is likely related to prior cholecystectomy without evidence of overt common bile duct obstruction. 4. Trace bilateral pleural effusions. 5. Hepatomegaly. Electronically Signed   By: AEddie CandleM.D.   On: 02/21/2021 13:31   MR 3D Recon At Scanner  Result Date: 02/21/2021 CLINICAL DATA:  Pancreatic cancer, metastatic disease evaluation EXAM: MRI ABDOMEN WITHOUT AND WITH CONTRAST TECHNIQUE: Multiplanar multisequence MR imaging of the abdomen was performed both before and after the administration of intravenous contrast. CONTRAST:  161mGADAVIST GADOBUTROL 1 MMOL/ML IV SOLN COMPARISON:  CT abdomen pelvis, 02/16/2021 FINDINGS: Lower chest: Trace bilateral pleural effusions. Hepatobiliary: Hepatomegaly, maximum coronal span 22.4 cm. There are faintly contrast enhancing, intrinsically T1 and T2 hyperintense masses of varying sizes throughout the liver, largest mass of the central left lobe measuring 9.9 x 9.2 cm (series 5, image 12). These are generally isosignal, isoenhancing to pancreatic head mass. Status post cholecystectomy. Mild dilatation of the common bile duct, measuring up to 8 mm and tapering to the ampulla. Pancreas: There is a mass of the posterior pancreatic  head measuring 4.3 x 3.8 cm with obstruction of the central pancreatic duct and diffuse dilatation up to 6 mm. Spleen:  Within normal limits in size and appearance. Adrenals/Urinary  Tract: No masses identified. No evidence of hydronephrosis. Stomach/Bowel: Visualized portions within the abdomen are unremarkable. Vascular/Lymphatic: No pathologically enlarged lymph nodes identified. No abdominal aortic aneurysm demonstrated. Left portal vein appears to be partially effaced by liver masses. The central portal vein is patent. Other:  None. Musculoskeletal: No suspicious bone lesions identified. IMPRESSION: 1. There is a mass of the posterior pancreatic head measuring 4.3 x 3.8 cm with obstruction of the central pancreatic duct and diffuse dilatation up to 6 mm. This is consistent with pancreatic adenocarcinoma. 2. There are faintly contrast enhancing, intrinsically T1 and T2 hyperintense masses of varying sizes throughout the liver, largest mass of the central left lobe measuring 9.9 x 9.2 cm. These are consistent with hepatic metastatic disease. 3. Mild dilatation of the common bile duct, measuring up to 8 mm and tapering to the ampulla. This is likely related to prior cholecystectomy without evidence of overt common bile duct obstruction. 4. Trace bilateral pleural effusions. 5. Hepatomegaly. Electronically Signed   By: Eddie Candle M.D.   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 Please perform ultrasound-guided liver mass biopsy for tissue diagnostic purposes. Additionally please perform image guided Port a catheter placement for durable intravenous access for the initiation of chemotherapy. EXAM: 1. ULTRASOUND-GUIDED LIVER LESION BIOPSY 2. IMPLANTED PORT A CATH PLACEMENT WITH ULTRASOUND AND FLUOROSCOPIC GUIDANCE COMPARISON:  CT abdomen and pelvis - 02/16/2021; abdominal MRI - 02/21/2021 MEDICATIONS: None ANESTHESIA/SEDATION: Moderate (conscious) sedation was employed during this procedure. A total of Versed 4 mg and Fentanyl 150 mcg was administered intravenously.  Moderate Sedation Time: 50 minutes. The patient's level of consciousness and vital signs were monitored continuously by radiology nursing throughout the procedure under my direct supervision. CONTRAST:  None FLUOROSCOPY TIME:  12 seconds (13 mGy) COMPLICATIONS: None immediate. PROCEDURE: The procedures, risks, benefits, and alternatives were explained to the patient. Questions regarding the procedure were encouraged and answered. The patient understands and consents to the procedure. Sonographic evaluation of the abdomen demonstrates an at least 3.5 x 3.4 cm hypoechoic mass within the dome of the left lobe the liver correlating with the dominant mass seen on preceding abdominal MRI image 31, series 26. The skin overlying the midline of the upper abdomen was prepped and draped in usual sterile fashion. After the overlying soft tissues were anesthetized with 1% lidocaine with epinephrine, a 17 gauge coaxial needle was advanced into the hypoechoic hepatic mass and five 18 gauge core needle biopsies were obtained under direct ultrasound guidance. The coaxial needle was removed following the administration of a Gel-Foam slurry. Postprocedural imaging was obtained. A dressing was applied. As the patient tolerated the procedure well, we then proceeded with placement of the port a catheter. _________________________________________________________ The right neck and chest were prepped with chlorhexidine in a sterile fashion, and a sterile drape was applied covering the operative field. Maximum barrier sterile technique with sterile gowns and gloves were used for the procedure. A timeout was performed prior to the initiation of the procedure. Local anesthesia was provided with 1% lidocaine with epinephrine. After creating a small venotomy incision, a micropuncture kit was utilized to access the internal jugular vein. Real-time ultrasound guidance was utilized for vascular access including the acquisition  of a permanent  ultrasound image documenting patency of the accessed vessel. The microwire was utilized to measure appropriate catheter length. A subcutaneous port pocket was then created along the upper chest wall utilizing a combination of sharp and blunt dissection. The pocket was irrigated with sterile saline. A single lumen "standard sized" power injectable port was chosen for placement. The 8 Fr catheter was tunneled from the port pocket site to the venotomy incision. The port was placed in the pocket. The external catheter was trimmed to appropriate length. At the venotomy, an 8 Fr peel-away sheath was placed over a guidewire under fluoroscopic guidance. The catheter was then placed through the sheath and the sheath was removed. Final catheter positioning was confirmed and documented with a fluoroscopic spot radiograph. The port was accessed with a Huber needle and noted to easily aspirate and flush. The venotomy site was closed with an interrupted 4-0 Vicryl suture. The port pocket incision was closed with interrupted 2-0 Vicryl suture. The skin was opposed with a running subcuticular 4-0 Vicryl suture. Dermabond and Steri-strips were applied to both incisions. Dressings were applied. The Huber needle was then reinserted to allow for immediate in hospitalization usage. The patient tolerated the procedure well without immediate post procedural complication. FINDINGS: Under direct ultrasound guidance, five 18 gauge core needle biopsies were obtained the dominant hypoechoic mass involving the dome liver. After catheter placement, the tip lies within the superior cavoatrial junction. The catheter aspirates and flushes normally and is ready for immediate use. IMPRESSION: 1. Technically successful ultrasound-guided biopsy of dominant hypoechoic mass within the dome of the left lobe of the liver. 2. Successful placement of a right internal jugular approach power injectable Port-A-Cath. The port a Catheter was left accessed for  immediate usage. Electronically Signed   By: Sandi Mariscal M.D.   On: 02/25/2021 09:13   MR CARDIAC MORPHOLOGY W WO CONTRAST  Result Date: 02/21/2021 CLINICAL DATA:  RV filling defect on CT EXAM: CARDIAC MRI TECHNIQUE: The patient was scanned on a 1.5 Tesla Siemens magnet. A dedicated cardiac coil was used. Functional imaging was done using Fiesta sequences. 2,3, and 4 chamber views were done to assess for RWMA's. Modified Simpson's rule using a short axis stack was used to calculate an ejection fraction on a dedicated work Conservation officer, nature. The patient received 10 cc of Gadavist. After 10 minutes inversion recovery sequences were used to assess for infiltration and scar tissue. CONTRAST:  10 cc  of Gadavist FINDINGS: Left ventricle: -Normal size -Normal systolic function -Elevated ECV (36%) -No LGE LV EF:  53% (Normal 56-78%) Absolute volumes: LV EDV: 148m (Normal 52-141 mL) LV ESV: 761m(Normal 13-51 mL) LV SV: 9087mNormal 33-97 mL) CO: 7.4L/min (Normal 2.7-6.0 L/min) Indexed volumes: LV EDV: 44m53m-m (Normal 41-81 mL/sq-m) LV ESV: 34mL11mm (Normal 12-21 mL/sq-m) LV SV: 39mL/6m (Normal 26-56 mL/sq-m) CI: 3.2L/min/sq-m (Normal 1.8-3.8 L/min/sq-m) Right ventricle: -Normal size and systolic function -Mass in mid RV attached to interventricular septum measuring 16mm x81m. Ma92mis heterogenous, with central area of low T1/high T2 and peripheral area of high T1. Dark on EGE but heterogenous area of enhancement on LGE RV EF: 59% (Normal 47-80%) Absolute volumes: RV EDV: 155mL (No33m 58-154 mL) RV ESV: 64mL (Nor22m12-68 mL) RV SV: 91mL (Norm44m5-98 mL) CO: 7.5L/min (Normal 2.7-6 L/min) Indexed volumes: RV EDV: 67mL/sq-m (19mal 48-87 mL/sq-m) RV ESV: 28mL/sq-m (N81ml 11-28 mL/sq-m) RV SV: 40mL/sq-m (No64m 27-57 mL/sq-m) CI: 3.2L/min/sq-m (Normal 1.8-3.8 L/min/sq-m) Left atrium: Mild enlargement Right atrium:  Normal size Mitral valve: No regurgitation Aortic valve: No regurgitation Tricuspid  valve: No regurgitation Pulmonic valve: No regurgitation Aorta: Normal proximal ascending aorta Pericardium: Normal IMPRESSION: 1. Mass in mid RV attached to interventricular septum measures 104m x 648m Mass appears heterogenous, with area of enhancement on LGE imaging. Given patient with suspected malignancy, mass is concerning for metastatic tumor 2. Normal LV size and systolic function (EF 5378% No late gadolinium enhancement to suggest myocardial scar 3.  Normal RV size and systolic function (EF 5958%Electronically Signed   By: ChOswaldo MilianD   On: 02/21/2021 01:00   ECHOCARDIOGRAM COMPLETE  Result Date: 02/17/2021    ECHOCARDIOGRAM REPORT   Patient Name:   Wendy LOREate of Exam: 02/17/2021 Medical Rec #:  [id number]     Height:       64.0 in Accession #:    [id number]    Weight:       258.1 lb Date of Birth:  3/02-06-1966     BSA:          2.180 m Patient Age:    [age]        BP:           117/77 mmHg Patient Gender: F               HR:           69 bpm. Exam Location:  Inpatient Procedure: 2D Echo, 3D Echo, Cardiac Doppler and Color Doppler Indications:    R94.31 Abnormal EKG  History:        Patient has prior history of Echocardiogram examinations, most                 recent 04/18/2008. COPD, Signs/Symptoms:Chest Pain; Risk                 Factors:Hypertension, Dyslipidemia and Current Smoker.  Sonographer:    TiRoseanna RainbowDCS Referring Phys: [id number] Strong WestSonographer Comments: Technically difficult study due to poor echo windows and patient is morbidly obese. Image acquisition challenging due to patient body habitus. IMPRESSIONS  1. Left ventricular ejection fraction, by estimation, is 55 to 60%. The left ventricle has normal function. The left ventricle has no regional wall motion abnormalities. Left ventricular diastolic parameters were normal.  2. Right ventricular systolic function is normal. The right ventricular size is normal.  3. The mitral valve is normal in structure.  No evidence of mitral valve regurgitation.  4. The aortic valve is normal in structure. Aortic valve regurgitation is not visualized. FINDINGS  Left Ventricle: Left ventricular ejection fraction, by estimation, is 55 to 60%. The left ventricle has normal function. The left ventricle has no regional wall motion abnormalities. The left ventricular internal cavity size was normal in size. There is  no left ventricular hypertrophy. Left ventricular diastolic parameters were normal. Right Ventricle: The right ventricular size is normal. No increase in right ventricular wall thickness. Right ventricular systolic function is normal. Left Atrium: Left atrial size was normal in size. Right Atrium: Right atrial size was normal in size. Pericardium: There is no evidence of pericardial effusion. Mitral Valve: The mitral valve is normal in structure. No evidence of mitral valve regurgitation. Tricuspid Valve: The tricuspid valve is grossly normal. Tricuspid valve regurgitation is trivial. Aortic Valve: The aortic valve is normal in structure. Aortic valve regurgitation is not visualized. Pulmonic Valve: The pulmonic valve was grossly normal. Pulmonic valve regurgitation is not visualized.  Aorta: The aortic root and ascending aorta are structurally normal, with no evidence of dilitation. IAS/Shunts: The atrial septum is grossly normal.  LEFT VENTRICLE PLAX 2D LVIDd:         4.70 cm      Diastology LVIDs:         3.00 cm      LV e' medial:    6.85 cm/s LV PW:         1.30 cm      LV E/e' medial:  13.7 LV IVS:        1.20 cm      LV e' lateral:   9.79 cm/s LVOT diam:     2.00 cm      LV E/e' lateral: 9.6 LV SV:         88 LV SV Index:   40 LVOT Area:     3.14 cm  LV Volumes (MOD) LV vol d, MOD A2C: 97.7 ml LV vol d, MOD A4C: 146.0 ml LV vol s, MOD A2C: 42.0 ml LV vol s, MOD A4C: 46.4 ml LV SV MOD A2C:     55.7 ml LV SV MOD A4C:     146.0 ml LV SV MOD BP:      76.1 ml RIGHT VENTRICLE             IVC RV S prime:     12.80 cm/s  IVC  diam: 1.50 cm TAPSE (M-mode): 2.1 cm LEFT ATRIUM             Index       RIGHT ATRIUM           Index LA diam:        3.30 cm 1.51 cm/m  RA Area:     12.00 cm LA Vol (A2C):   46.6 ml 21.37 ml/m RA Volume:   21.60 ml  9.91 ml/m LA Vol (A4C):   29.6 ml 13.58 ml/m LA Biplane Vol: 37.7 ml 17.29 ml/m  AORTIC VALVE LVOT Vmax:   131.00 cm/s LVOT Vmean:  91.900 cm/s LVOT VTI:    0.279 m  AORTA Ao Root diam: 3.70 cm MITRAL VALVE MV Area (PHT): 4.06 cm    SHUNTS MV Decel Time: 187 msec    Systemic VTI:  0.28 m MV E velocity: 93.60 cm/s  Systemic Diam: 2.00 cm MV A velocity: 84.40 cm/s MV E/A ratio:  1.11 Mertie Moores MD Electronically signed by Mertie Moores MD Signature Date/Time: 02/17/2021/3:41:34 PM    Final    IR IMAGING GUIDED PORT INSERTION  Result Date: 02/25/2021 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 Please perform ultrasound-guided liver mass biopsy for tissue diagnostic purposes. Additionally please perform image guided Port a catheter placement for durable intravenous access for the initiation of chemotherapy. EXAM: 1. ULTRASOUND-GUIDED LIVER LESION BIOPSY 2. IMPLANTED PORT A CATH PLACEMENT WITH ULTRASOUND AND FLUOROSCOPIC GUIDANCE COMPARISON:  CT abdomen and pelvis - 02/16/2021; abdominal MRI - 02/21/2021 MEDICATIONS: None ANESTHESIA/SEDATION: Moderate (conscious) sedation was employed during this procedure. A total of Versed 4 mg and Fentanyl 150 mcg was administered intravenously. Moderate Sedation Time: 50 minutes. The patient's level of consciousness and vital signs were monitored continuously by radiology nursing throughout the procedure under my direct supervision. CONTRAST:  None FLUOROSCOPY TIME:  12 seconds (13 mGy) COMPLICATIONS: None immediate. PROCEDURE: The procedures, risks, benefits, and alternatives were explained to the patient. Questions regarding the procedure were encouraged and answered. The patient understands and  consents  to the procedure. Sonographic evaluation of the abdomen demonstrates an at least 3.5 x 3.4 cm hypoechoic mass within the dome of the left lobe the liver correlating with the dominant mass seen on preceding abdominal MRI image 31, series 26. The skin overlying the midline of the upper abdomen was prepped and draped in usual sterile fashion. After the overlying soft tissues were anesthetized with 1% lidocaine with epinephrine, a 17 gauge coaxial needle was advanced into the hypoechoic hepatic mass and five 18 gauge core needle biopsies were obtained under direct ultrasound guidance. The coaxial needle was removed following the administration of a Gel-Foam slurry. Postprocedural imaging was obtained. A dressing was applied. As the patient tolerated the procedure well, we then proceeded with placement of the port a catheter. _________________________________________________________ The right neck and chest were prepped with chlorhexidine in a sterile fashion, and a sterile drape was applied covering the operative field. Maximum barrier sterile technique with sterile gowns and gloves were used for the procedure. A timeout was performed prior to the initiation of the procedure. Local anesthesia was provided with 1% lidocaine with epinephrine. After creating a small venotomy incision, a micropuncture kit was utilized to access the internal jugular vein. Real-time ultrasound guidance was utilized for vascular access including the acquisition of a permanent ultrasound image documenting patency of the accessed vessel. The microwire was utilized to measure appropriate catheter length. A subcutaneous port pocket was then created along the upper chest wall utilizing a combination of sharp and blunt dissection. The pocket was irrigated with sterile saline. A single lumen "standard sized" power injectable port was chosen for placement. The 8 Fr catheter was tunneled from the port pocket site to the venotomy incision. The port was  placed in the pocket. The external catheter was trimmed to appropriate length. At the venotomy, an 8 Fr peel-away sheath was placed over a guidewire under fluoroscopic guidance. The catheter was then placed through the sheath and the sheath was removed. Final catheter positioning was confirmed and documented with a fluoroscopic spot radiograph. The port was accessed with a Huber needle and noted to easily aspirate and flush. The venotomy site was closed with an interrupted 4-0 Vicryl suture. The port pocket incision was closed with interrupted 2-0 Vicryl suture. The skin was opposed with a running subcuticular 4-0 Vicryl suture. Dermabond and Steri-strips were applied to both incisions. Dressings were applied. The Huber needle was then reinserted to allow for immediate in hospitalization usage. The patient tolerated the procedure well without immediate post procedural complication. FINDINGS: Under direct ultrasound guidance, five 18 gauge core needle biopsies were obtained the dominant hypoechoic mass involving the dome liver. After catheter placement, the tip lies within the superior cavoatrial junction. The catheter aspirates and flushes normally and is ready for immediate use. IMPRESSION: 1. Technically successful ultrasound-guided biopsy of dominant hypoechoic mass within the dome of the left lobe of the liver. 2. Successful placement of a right internal jugular approach power injectable Port-A-Cath. The port a Catheter was left accessed for immediate usage. Electronically Signed   By: Sandi Mariscal M.D.   On: 02/25/2021 09:13    ASSESSMENT AND PLAN: 1.    Pancreatic/duodenal mass, biopsy pending -CT abdomen/pelvis without contrast 02/16/2021-multiple large hypoechoic masses in the patient's liver consistent metastatic disease, ill-defined masslike area in the pancreatic head measuring approximate 4.7 cm, possible filling defect in the right ventricle. -EGD 02/19/2021-large infiltrative mass with bleeding in  the second portion of the duodenum, appears to be arising near  the ampulla, partially obstructive with friable mucosa. -Flexible sigmoidoscopy 02/19/2021-diverticulosis in the sigmoid colon, nonbleeding external and internal hemorrhoids -Cardiac CT 02/20/2021-mass in the mid RV attached to the interventricular septum measuring 16 mm x 6 mm and concerning for metastatic tumor -MRI abdomen with/without contrast 02/21/2021-mass in the posterior pancreatic head measuring 4.3 x 3.8 cm with obstruction of the central pancreatic duct and diffuse dilatation up to 6 mm consistent with pancreatic adenocarcinoma, liver lesions the largest mass of the central left lobe measuring 9.9 x 9.2 cm consistent with hepatic metastatic disease, hepatomegaly. 2.  Iron deficiency anemia -Feraheme 510 mg 02/20/2021 3.  Protein calorie malnutrition 4.  Possible right ventricular filling defect on noncontrast CT scan 5.  Hypertension 6.  History of CVA 7.  Hyperlipidemia 8.  Hypothyroidism 9.  Morbid obesity 10.  Asthma 11.  Migraines  Wendy Hoffman appears stable.  Biopsy of the duodenal mass is consistent with adenocarcinoma.  Liver biopsy results are currently pending. Likely metastatic pancreatic cancer given markedly elevated CA 19.9 elevated at 17,515. She is aware that no therapy will be curative.  We will consider her for systemic chemotherapy as an outpatient.  Port-A-Cath has been placed in anticipation of chemotherapy.  She is receiving radiation to the duodenal mass under the care of Dr. Lisbeth Renshaw.  She received a dose of Feraheme 510 mg IV on 02/20/2021.  Hemoglobin was down to 6.9 this morning and she is receiving 1 unit PRBCs today.  We will consider repeat Feraheme as outpatient.  Pain is better with Roxanol but not fully controlled.  Will add MS Contin 30 mg twice daily.  Continue to monitor nutrition closely.  She is currently tolerating a soft diet.  Recommendations: 1.  Add MS Contin 30 mg twice daily.   Continue Roxanol as needed. 2.  Continue radiation under the care of Dr. Lisbeth Renshaw. 3.  We will arrange for genetic counseling as an outpatient.  If pain is controlled and she is tolerating her diet, she may be discharged to home tomorrow after her radiation from our standpoint.   LOS: 10 days   Mikey Bussing, DNP, AGPCNP-BC, AOCNP 02/26/21  Wendy Hoffman was interviewed and examined.  She continues to have abdominal and back pain, not adequately relieved with the current narcotic regimen.  We added MS Contin.  She will continue Roxanol for breakthrough pain. She began palliative radiation yesterday.  The liver biopsy revealed adenocarcinoma, confirming a diagnosis of metastatic disease.  We will request molecular testing on the liver biopsy tissue.  She can be discharged to home when she is tolerating a diet and the pain is adequately controlled.  We will plan to begin systemic therapy at the completion of radiation.  I was present for greater than 50% of today's visit.  I performed medical decision making.  Julieanne Manson, MD

## 2021-02-26 NOTE — Progress Notes (Signed)
PROGRESS NOTE  Wendy Hoffman  DOB: 08/10/1966  PCP: Patient, No Pcp Per [facility]:[id number]  DOA: 02/16/2021  LOS: 10 days   Chief Complaint  Patient presents with  . Abdominal Pain  . Emesis   Brief narrative: Wendy Hoffman is a 55 y.o. female with PMH significant for morbid obesity, HTN, HLD, cervical cancer/hysterectomy, chronic iron deficiency anemia, anxiety, osteoarthritis, asthma/COPD, hypothyroidism, insomnia, migraine headaches.  Patient presented to the ED on 3/7 with nausea, vomiting, abdominal pain, bloating and 38 pound weight loss in 2 months. In the ED, fecal occult blood test was positive, hemoglobin low at 5.8. CT abdomen/pelvis without contrast showed multiple large hypoechoic masses in the patient's liver consistent with metastatic disease. There is an ill-defined masslike area in the pancreatic head measuring about 4.7 cm.  Admitted to hospitalist service. See below for details.  Subjective: Patient was seen and examined this morning.   Sitting up in chair.   Pain controlled Hemoglobin low this morning at 6.9.  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 -3/7 CT scan showed pancreatic head mass of 4.7 cm with multiple liver mets -3/10 EGD showed large infiltrative mass with bleeding in the second portion of the duodenum, arising near the ampulla, partially obstructive with friable mucosa.  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 -MRI 3/12 confirmed the finding and also showed obstruction of the central pancreatic duct with diffuse dilation up to 6 mm consistent with pancreatic adenocarcinoma.  Also confirmed hepatic mets. There is mild dilation of the CBD without obstruction which probably is related to prior cholecystectomy status. -3/15, patient underwent biopsy of the liver mass as well as Port-A-Cath placement. -GI, oncology, IR and radiation oncology consult  appreciated.  -Started on radiation treatment. -Planned for chemo as an outpatient.-Able to tolerate soft diet.  No evidence of gastric outlet obstruction so far. -Continue pain control.  Minimize use of IV pain medicines.  Started on MS Contin twice daily today.  Also on as needed Robinul.  Acute on chronic GI bleeding  Iron deficiency anemia -Hemoglobin on admission was low at 5 on admission. -3 units of PRBC transfused so far.  Patient may be slowly bleeding from the duodenal mass..  Hemoglobin trend as below, 6.9 today.  1 more unit of PRBC transfusion ordered for today. -IV Feraheme given 3/11. Recent Labs    02/16/21 2252 02/17/21 0845 02/23/21 0350 02/24/21 0412 02/25/21 0506 02/26/21 0421 02/26/21 0624  HGB 5.0*   < > 7.5* 8.5* 7.5* 6.9* 7.1*  MCV  --    < > 78.9* 84.2 83.7 83.6  --   VITAMINB12 330  --   --   --   --   --   --   FOLATE 12.0  --   --   --   --   --   --   FERRITIN 7*  --   --   --   --   --   --   TIBC 400  --   --   --   --   --   --   IRON 16*  --   --   --   --   --   --   RETICCTPCT 1.7  --   --   --   --   --   --    < > = values in this interval not displayed.   Filling defect on the right ventricle -Per CT abdomen  afternoon admission. -I discussed with cardiologist Dr. Acie Fredrickson and Dr. Reather Littler.  Underwent cardiac MRI on 3/11.  MRI showed 16 mm x 6 mm mass in the RV attached to the interventricular septum, concerning for metastatic tumor.   Hypertension -Amlodipine on hold.  Blood pressure stable without meds  High cholesterol/Aortic atherosclerosis -Continue rosuvastatin.  Hypothyroidism -Continue Synthroid  Morbid obesity  -Body mass index is 46.36 kg/m. Patient has been advised to make an attempt to improve diet and exercise patterns to aid in weight loss.  COPD -Supplemental oxygen as needed. -Bronchodilators as needed.  Mobility: Encourage ambulation Code Status:   Code Status: Full Code  Nutritional status: Body mass index is  46.36 kg/m. Nutrition Problem: Increased nutrient needs Etiology: acute illness,cancer and cancer related treatments Signs/Symptoms: estimated needs Diet Order            Diet regular Room service appropriate? Yes; Fluid consistency: Thin  Diet effective now                 DVT prophylaxis: SCDs Start: 02/16/21 1723   Antimicrobials:  None Fluid: Not on IV fluid Consultants: GI, oncology, IR, radiation oncology Family Communication:  None at bedside  Status is: Inpatient  Remains inpatient appropriate because: Pending liver biopsy, Port-A-Cath replacement  Dispo: The patient is from: Home               Anticipated d/c is to: Home x-ray tomorrow after radiation.              Patient currently is not medically stable to d/c.   Difficult to place patient No       Infusions:  . ferumoxytol Stopped (02/20/21 1854)    Scheduled Meds: . (feeding supplement) PROSource Plus  30 mL Oral TID BM  . Chlorhexidine Gluconate Cloth  6 each Topical Daily  . feeding supplement  237 mL Oral BID BM  . gabapentin  300 mg Oral QHS  . mometasone-formoterol  2 puff Inhalation BID  . morphine  30 mg Oral Q12H  . multivitamin with minerals  1 tablet Oral Daily  . pantoprazole  40 mg Oral BID  . sodium chloride flush  10-40 mL Intracatheter Q12H  . zolpidem  5 mg Oral QHS    Antimicrobials: Anti-infectives (From admission, onward)   None      PRN meds: albuterol, alum & mag hydroxide-simeth, diphenhydrAMINE, hydrocortisone cream, iohexol, morphine CONCENTRATE, ondansetron **OR** ondansetron (ZOFRAN) IV, polyethylene glycol, sodium chloride flush   Objective: Vitals:   02/25/21 2208 02/26/21 0603  BP: 121/76 125/80  Pulse: (!) 105 87  Resp:    Temp: 99 F (37.2 C) 98.5 F (36.9 C)  SpO2: 99% 97%    Intake/Output Summary (Last 24 hours) at 02/26/2021 1400 Last data filed at 02/26/2021 1041 Gross per 24 hour  Intake 370 ml  Output --  Net 370 ml   Filed Weights    02/16/21 1214 02/16/21 1913 02/23/21 2200  Weight: 118.4 kg 117.1 kg 122.5 kg   Weight change:  Body mass index is 46.36 kg/m.   Physical Exam: General exam: Pleasant, middle-aged African-American female.  Intermittent partially controlled pain Skin: No rashes, lesions or ulcers. HEENT: Atraumatic, normocephalic, no obvious bleeding Lungs: Clear to auscultation bilaterally CVS: Regular rate and rhythm, no murmur GI/Abd soft, epigastric tenderness still present nondistended, bowel sound present CNS: Alert, awake, oriented x3. Psychiatry: Mood appropriate. Extremities: No pedal edema, no calf tenderness  Data Review: I have personally reviewed the laboratory data  and studies available.  Recent Labs  Lab 02/21/21 0323 02/23/21 0350 02/24/21 0412 02/25/21 0506 02/26/21 0421 02/26/21 0624  WBC 9.1 9.5 10.8* 9.8 9.4  --   NEUTROABS 6.5 6.4 8.0* 7.2 7.1  --   HGB 7.2* 7.5* 8.5* 7.5* 6.9* 7.1*  HCT 23.8* 25.0* 29.3* 26.2* 23.4* 24.2*  MCV 79.6* 78.9* 84.2 83.7 83.6  --   PLT 277 292 342 303 259  --    Recent Labs  Lab 02/21/21 0323 02/23/21 0350 02/24/21 0412 02/25/21 0506 02/26/21 0421  NA 140 139 138 140 142  K 3.0* 3.3* 3.7 3.7 3.8  CL 105 102 100 103 104  CO2 25 32 26 27 31   GLUCOSE 107* 94 90 105* 98  BUN 9 6 7 8 9   CREATININE 0.65 0.54 0.72 0.66 0.56  CALCIUM 9.0 9.1 9.3 9.3 9.3    F/u labs ordered Unresulted Labs (From admission, onward)         None      Signed, Terrilee Croak, MD [facility] 02/26/2021

## 2021-02-26 NOTE — Plan of Care (Signed)
  Problem: Health Behavior/Discharge Planning: Goal: Ability to manage health-related needs will improve Outcome: Progressing   Problem: Clinical Measurements: Goal: Ability to maintain clinical measurements within normal limits will improve Outcome: Progressing   Problem: Nutrition: Goal: Adequate nutrition will be maintained Outcome: Progressing   Problem: Pain Managment: Goal: General experience of comfort will improve Outcome: Progressing   

## 2021-02-27 ENCOUNTER — Telehealth: Payer: Self-pay | Admitting: Oncology

## 2021-02-27 ENCOUNTER — Ambulatory Visit
Admit: 2021-02-27 | Discharge: 2021-02-27 | Disposition: A | Discharge status: Home / Self Care | Payer: Medicaid Other | Attending: Radiation Oncology | Admitting: Radiation Oncology

## 2021-02-27 DIAGNOSIS — I10 Essential (primary) hypertension: Secondary | ICD-10-CM

## 2021-02-27 DIAGNOSIS — C250 Malignant neoplasm of head of pancreas: Principal | ICD-10-CM

## 2021-02-27 DIAGNOSIS — E669 Obesity, unspecified: Secondary | ICD-10-CM

## 2021-02-27 DIAGNOSIS — R160 Hepatomegaly, not elsewhere classified: Secondary | ICD-10-CM

## 2021-02-27 DIAGNOSIS — D649 Anemia, unspecified: Secondary | ICD-10-CM

## 2021-02-27 DIAGNOSIS — C787 Secondary malignant neoplasm of liver and intrahepatic bile duct: Secondary | ICD-10-CM

## 2021-02-27 DIAGNOSIS — R1013 Epigastric pain: Secondary | ICD-10-CM

## 2021-02-27 DIAGNOSIS — R195 Other fecal abnormalities: Secondary | ICD-10-CM

## 2021-02-27 DIAGNOSIS — D500 Iron deficiency anemia secondary to blood loss (chronic): Secondary | ICD-10-CM

## 2021-02-27 DIAGNOSIS — J431 Panlobular emphysema: Secondary | ICD-10-CM

## 2021-02-27 LAB — CBC WITH DIFFERENTIAL/PLATELET
Abs Immature Granulocytes: 0.05 10*3/uL (ref 0.00–0.07)
Basophils Absolute: 0 10*3/uL (ref 0.0–0.1)
Basophils Relative: 0 %
Eosinophils Absolute: 0.4 10*3/uL (ref 0.0–0.5)
Eosinophils Relative: 4 %
HCT: 28.4 % — ABNORMAL LOW (ref 36.0–46.0)
Hemoglobin: 8.4 g/dL — ABNORMAL LOW (ref 12.0–15.0)
Immature Granulocytes: 1 %
Lymphocytes Relative: 12 %
Lymphs Abs: 1.1 10*3/uL (ref 0.7–4.0)
MCH: 24.8 pg — ABNORMAL LOW (ref 26.0–34.0)
MCHC: 29.6 g/dL — ABNORMAL LOW (ref 30.0–36.0)
MCV: 83.8 fL (ref 80.0–100.0)
Monocytes Absolute: 0.5 10*3/uL (ref 0.1–1.0)
Monocytes Relative: 6 %
Neutro Abs: 6.7 10*3/uL (ref 1.7–7.7)
Neutrophils Relative %: 77 %
Platelets: 295 10*3/uL (ref 150–400)
RBC: 3.39 MIL/uL — ABNORMAL LOW (ref 3.87–5.11)
RDW: 26 % — ABNORMAL HIGH (ref 11.5–15.5)
WBC: 8.8 10*3/uL (ref 4.0–10.5)
nRBC: 0 % (ref 0.0–0.2)

## 2021-02-27 LAB — COMPREHENSIVE METABOLIC PANEL
ALT: 30 U/L (ref 0–44)
AST: 44 U/L — ABNORMAL HIGH (ref 15–41)
Albumin: 3.2 g/dL — ABNORMAL LOW (ref 3.5–5.0)
Alkaline Phosphatase: 80 U/L (ref 38–126)
Anion gap: 9 (ref 5–15)
BUN: 9 mg/dL (ref 6–20)
CO2: 29 mmol/L (ref 22–32)
Calcium: 9.2 mg/dL (ref 8.9–10.3)
Chloride: 99 mmol/L (ref 98–111)
Creatinine, Ser: 0.56 mg/dL (ref 0.44–1.00)
GFR, Estimated: >60 mL/min (ref >60)
Glucose, Bld: 105 mg/dL — ABNORMAL HIGH (ref 70–99)
Potassium: 4 mmol/L (ref 3.5–5.1)
Sodium: 137 mmol/L (ref 135–145)
Total Bilirubin: 0.7 mg/dL (ref 0.3–1.2)
Total Protein: 6.7 g/dL (ref 6.5–8.1)

## 2021-02-27 LAB — TYPE AND SCREEN
ABO/RH(D): A POS
Antibody Screen: NEGATIVE
Unit division: 0

## 2021-02-27 LAB — BPAM RBC
Blood Product Expiration Date: 202204122359
ISSUE DATE / TIME: 202203171441
Unit Type and Rh: 6200

## 2021-02-27 LAB — PHOSPHORUS: Phosphorus: 4.4 mg/dL (ref 2.5–4.6)

## 2021-02-27 LAB — MAGNESIUM: Magnesium: 2 mg/dL (ref 1.7–2.4)

## 2021-02-27 MED ORDER — FLUCONAZOLE 100 MG PO TABS
100.0000 mg | ORAL_TABLET | Freq: Every day | ORAL | Status: DC
  Administered 2021-02-27 – 2021-02-28 (×2): 100 mg via ORAL
  Filled 2021-02-27 (×2): qty 1

## 2021-02-27 MED ORDER — SODIUM CHLORIDE 0.9 % IV SOLN
25.0000 mg | Freq: Once | INTRAVENOUS | Status: AC
  Administered 2021-02-27: 25 mg via INTRAVENOUS
  Filled 2021-02-27: qty 1

## 2021-02-27 MED ORDER — SORBITOL 70 % SOLN
960.0000 mL | TOPICAL_OIL | Freq: Once | ORAL | Status: AC
  Administered 2021-02-27: 960 mL via RECTAL
  Filled 2021-02-27: qty 473

## 2021-02-27 NOTE — Plan of Care (Signed)
  Problem: Health Behavior/Discharge Planning: Goal: Ability to manage health-related needs will improve Outcome: Progressing   Problem: Activity: Goal: Risk for activity intolerance will decrease Outcome: Progressing   Problem: Nutrition: Goal: Adequate nutrition will be maintained Outcome: Progressing   Problem: Pain Managment: Goal: General experience of comfort will improve Outcome: Progressing   Problem: Clinical Measurements: Goal: Complications related to the disease process, condition or treatment will be avoided or minimized Outcome: Progressing

## 2021-02-27 NOTE — Progress Notes (Signed)
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 Julieanne Manson.  Email was sent to Luellen Pucker at [facility] pathology dept.

## 2021-02-27 NOTE — Progress Notes (Addendum)
HEMATOLOGY-ONCOLOGY PROGRESS NOTE  SUBJECTIVE: Pain improved this morning with addition of MS Contin.  She continues to use as needed liquid morphine.  Reports mild nausea but no vomiting.  States food tastes funny.  PHYSICAL EXAMINATION:  Vitals:   02/26/21 2122 02/27/21 0426  BP: 123/67 113/73  Pulse: 94 86  Resp: 19 19  Temp: 98.5 F (36.9 C) 98.4 F (36.9 C)  SpO2: 100% 94%   Filed Weights   02/16/21 1214 02/16/21 1913 02/23/21 2200  Weight: 118.4 kg 117.1 kg 122.5 kg    Intake/Output from previous day: 03/17 0701 - 03/18 0700 In: 325 [I.V.:10; Blood:315] Out: 1 [Urine:1]  GENERAL:alert, no distress and comfortable OROPHARYNX: Thrush noted to tongue and oropharynx SKIN: skin color, texture, turgor are normal, no rashes or significant lesions LUNGS: clear to auscultation and percussion with normal breathing effort HEART: regular rate & rhythm and no murmurs and no lower extremity edema ABDOMEN: Positive bowel sounds, soft, mild tenderness over the epigastric area NEURO: alert & oriented x 3 with fluent speech, no focal motor/sensory deficits Port-A-Cath without erythema  LABORATORY DATA:  I have reviewed the data as listed CMP Latest Ref Rng & Units 02/26/2021 02/25/2021 02/24/2021  Glucose 70 - 99 mg/dL 98 105(H) 90  BUN 6 - 20 mg/dL '9 8 7  ' Creatinine 0.44 - 1.00 mg/dL 0.56 0.66 0.72  Sodium 135 - 145 mmol/L 142 140 138  Potassium 3.5 - 5.1 mmol/L 3.8 3.7 3.7  Chloride 98 - 111 mmol/L 104 103 100  CO2 22 - 32 mmol/L '31 27 26  ' Calcium 8.9 - 10.3 mg/dL 9.3 9.3 9.3  Total Protein 6.5 - 8.1 g/dL - - -  Total Bilirubin 0.3 - 1.2 mg/dL - - -  Alkaline Phos 38 - 126 U/L - - -  AST 15 - 41 U/L - - -  ALT 0 - 44 U/L - - -    Lab Results  Component Value Date   WBC 9.4 02/26/2021   HGB 7.1 (L) 02/26/2021   HCT 24.2 (L) 02/26/2021   MCV 83.6 02/26/2021   PLT 259 02/26/2021   NEUTROABS 7.1 02/26/2021    CT ABDOMEN PELVIS WO CONTRAST  Result Date:  02/16/2021 CLINICAL DATA:  Abdominal distension. EXAM: CT ABDOMEN AND PELVIS WITHOUT CONTRAST TECHNIQUE: Multidetector CT imaging of the abdomen and pelvis was performed following the standard protocol without IV contrast. COMPARISON:  None recent FINDINGS: Lower chest: The lung bases are clear.There is a possible filling defect within the right ventricle (axial series 2, image 4). The intracardiac blood pool is hypodense relative to the adjacent myocardium consistent with anemia. Hepatobiliary: Multiple large hypoechoic masses are noted in the patient's liver. The largest is located in the left hepatic lobe measures approximately 10 cm. The gallbladder is not well evaluated.There is no biliary ductal dilation. Pancreas: There is an ill-defined masslike area in the pancreatic head measuring approximately 4.7 cm (axial series 2, image 41). Spleen: Unremarkable. Adrenals/Urinary Tract: --Adrenal glands: Unremarkable. --Right kidney/ureter: No hydronephrosis or radiopaque kidney stones. --Left kidney/ureter: There is a punctate nonobstructing stone in the upper pole the left kidney. --Urinary bladder: Unremarkable. Stomach/Bowel: --Stomach/Duodenum: No hiatal hernia or other gastric abnormality. Normal duodenal course and caliber. --Small bowel: Unremarkable. --Colon: Unremarkable. --Appendix: Normal. Vascular/Lymphatic: Atherosclerotic calcification is present within the non-aneurysmal abdominal aorta, without hemodynamically significant stenosis. --No retroperitoneal lymphadenopathy. --No mesenteric lymphadenopathy. --No pelvic or inguinal lymphadenopathy. Reproductive: Status post hysterectomy. No adnexal mass. Other: No ascites or free air. The abdominal wall  is normal. Musculoskeletal. No acute displaced fractures. IMPRESSION: 1. Multiple large hypoechoic masses in the patient's liver, consistent with metastatic disease. These are amenable to percutaneous biopsy as clinically indicated. 2. Ill-defined masslike area  in the pancreatic head measuring approximately 4.7 cm. This is concerning for pancreatic adenocarcinoma. Exam limited by lack of IV contrast. A follow-up contrast enhanced MRI would be useful for further evaluation and staging. 3. Possible filling defect within the right ventricle. Recommend further evaluation with a dedicated cardiac echo. 4. Anemia. 5. Punctate nonobstructing stone in the upper pole of the left kidney. Aortic Atherosclerosis (ICD10-I70.0). Electronically Signed   By: Constance Holster M.D.   On: 02/16/2021 16:04   MR ABDOMEN W WO CONTRAST  Result Date: 02/21/2021 CLINICAL DATA:  Pancreatic cancer, metastatic disease evaluation EXAM: MRI ABDOMEN WITHOUT AND WITH CONTRAST TECHNIQUE: Multiplanar multisequence MR imaging of the abdomen was performed both before and after the administration of intravenous contrast. CONTRAST:  49m GADAVIST GADOBUTROL 1 MMOL/ML IV SOLN COMPARISON:  CT abdomen pelvis, 02/16/2021 FINDINGS: Lower chest: Trace bilateral pleural effusions. Hepatobiliary: Hepatomegaly, maximum coronal span 22.4 cm. There are faintly contrast enhancing, intrinsically T1 and T2 hyperintense masses of varying sizes throughout the liver, largest mass of the central left lobe measuring 9.9 x 9.2 cm (series 5, image 12). These are generally isosignal, isoenhancing to pancreatic head mass. Status post cholecystectomy. Mild dilatation of the common bile duct, measuring up to 8 mm and tapering to the ampulla. Pancreas: There is a mass of the posterior pancreatic head measuring 4.3 x 3.8 cm with obstruction of the central pancreatic duct and diffuse dilatation up to 6 mm. Spleen:  Within normal limits in size and appearance. Adrenals/Urinary Tract: No masses identified. No evidence of hydronephrosis. Stomach/Bowel: Visualized portions within the abdomen are unremarkable. Vascular/Lymphatic: No pathologically enlarged lymph nodes identified. No abdominal aortic aneurysm demonstrated. Left portal  vein appears to be partially effaced by liver masses. The central portal vein is patent. Other:  None. Musculoskeletal: No suspicious bone lesions identified. IMPRESSION: 1. There is a mass of the posterior pancreatic head measuring 4.3 x 3.8 cm with obstruction of the central pancreatic duct and diffuse dilatation up to 6 mm. This is consistent with pancreatic adenocarcinoma. 2. There are faintly contrast enhancing, intrinsically T1 and T2 hyperintense masses of varying sizes throughout the liver, largest mass of the central left lobe measuring 9.9 x 9.2 cm. These are consistent with hepatic metastatic disease. 3. Mild dilatation of the common bile duct, measuring up to 8 mm and tapering to the ampulla. This is likely related to prior cholecystectomy without evidence of overt common bile duct obstruction. 4. Trace bilateral pleural effusions. 5. Hepatomegaly. Electronically Signed   By: AEddie CandleM.D.   On: 02/21/2021 13:31   MR 3D Recon At Scanner  Result Date: 02/21/2021 CLINICAL DATA:  Pancreatic cancer, metastatic disease evaluation EXAM: MRI ABDOMEN WITHOUT AND WITH CONTRAST TECHNIQUE: Multiplanar multisequence MR imaging of the abdomen was performed both before and after the administration of intravenous contrast. CONTRAST:  14mGADAVIST GADOBUTROL 1 MMOL/ML IV SOLN COMPARISON:  CT abdomen pelvis, 02/16/2021 FINDINGS: Lower chest: Trace bilateral pleural effusions. Hepatobiliary: Hepatomegaly, maximum coronal span 22.4 cm. There are faintly contrast enhancing, intrinsically T1 and T2 hyperintense masses of varying sizes throughout the liver, largest mass of the central left lobe measuring 9.9 x 9.2 cm (series 5, image 12). These are generally isosignal, isoenhancing to pancreatic head mass. Status post cholecystectomy. Mild dilatation of the common bile duct, measuring  up to 8 mm and tapering to the ampulla. Pancreas: There is a mass of the posterior pancreatic head measuring 4.3 x 3.8 cm with  obstruction of the central pancreatic duct and diffuse dilatation up to 6 mm. Spleen:  Within normal limits in size and appearance. Adrenals/Urinary Tract: No masses identified. No evidence of hydronephrosis. Stomach/Bowel: Visualized portions within the abdomen are unremarkable. Vascular/Lymphatic: No pathologically enlarged lymph nodes identified. No abdominal aortic aneurysm demonstrated. Left portal vein appears to be partially effaced by liver masses. The central portal vein is patent. Other:  None. Musculoskeletal: No suspicious bone lesions identified. IMPRESSION: 1. There is a mass of the posterior pancreatic head measuring 4.3 x 3.8 cm with obstruction of the central pancreatic duct and diffuse dilatation up to 6 mm. This is consistent with pancreatic adenocarcinoma. 2. There are faintly contrast enhancing, intrinsically T1 and T2 hyperintense masses of varying sizes throughout the liver, largest mass of the central left lobe measuring 9.9 x 9.2 cm. These are consistent with hepatic metastatic disease. 3. Mild dilatation of the common bile duct, measuring up to 8 mm and tapering to the ampulla. This is likely related to prior cholecystectomy without evidence of overt common bile duct obstruction. 4. Trace bilateral pleural effusions. 5. Hepatomegaly. Electronically Signed   By: Eddie Candle M.D.   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 Please perform ultrasound-guided liver mass biopsy for tissue diagnostic purposes. Additionally please perform image guided Port a catheter placement for durable intravenous access for the initiation of chemotherapy. EXAM: 1. ULTRASOUND-GUIDED LIVER LESION BIOPSY 2. IMPLANTED PORT A CATH PLACEMENT WITH ULTRASOUND AND FLUOROSCOPIC GUIDANCE COMPARISON:  CT abdomen and pelvis - 02/16/2021; abdominal MRI - 02/21/2021 MEDICATIONS: None  ANESTHESIA/SEDATION: Moderate (conscious) sedation was employed during this procedure. A total of Versed 4 mg and Fentanyl 150 mcg was administered intravenously. Moderate Sedation Time: 50 minutes. The patient's level of consciousness and vital signs were monitored continuously by radiology nursing throughout the procedure under my direct supervision. CONTRAST:  None FLUOROSCOPY TIME:  12 seconds (13 mGy) COMPLICATIONS: None immediate. PROCEDURE: The procedures, risks, benefits, and alternatives were explained to the patient. Questions regarding the procedure were encouraged and answered. The patient understands and consents to the procedure. Sonographic evaluation of the abdomen demonstrates an at least 3.5 x 3.4 cm hypoechoic mass within the dome of the left lobe the liver correlating with the dominant mass seen on preceding abdominal MRI image 31, series 26. The skin overlying the midline of the upper abdomen was prepped and draped in usual sterile fashion. After the overlying soft tissues were anesthetized with 1% lidocaine with epinephrine, a 17 gauge coaxial needle was advanced into the hypoechoic hepatic mass and five 18 gauge core needle biopsies were obtained under direct ultrasound guidance. The coaxial needle was removed following the administration of a Gel-Foam slurry. Postprocedural imaging was obtained. A dressing was applied. As the patient tolerated the procedure well, we then proceeded with placement of the port a catheter. _________________________________________________________ The right neck and chest were prepped with chlorhexidine in a sterile fashion, and a sterile drape was applied covering the operative field. Maximum barrier sterile technique with sterile gowns and gloves were used for the procedure. A timeout was performed prior to the initiation of the procedure. Local anesthesia was provided with 1% lidocaine with epinephrine. After creating a small venotomy incision, a micropuncture  kit  was utilized to access the internal jugular vein. Real-time ultrasound guidance was utilized for vascular access including the acquisition of a permanent ultrasound image documenting patency of the accessed vessel. The microwire was utilized to measure appropriate catheter length. A subcutaneous port pocket was then created along the upper chest wall utilizing a combination of sharp and blunt dissection. The pocket was irrigated with sterile saline. A single lumen "standard sized" power injectable port was chosen for placement. The 8 Fr catheter was tunneled from the port pocket site to the venotomy incision. The port was placed in the pocket. The external catheter was trimmed to appropriate length. At the venotomy, an 8 Fr peel-away sheath was placed over a guidewire under fluoroscopic guidance. The catheter was then placed through the sheath and the sheath was removed. Final catheter positioning was confirmed and documented with a fluoroscopic spot radiograph. The port was accessed with a Huber needle and noted to easily aspirate and flush. The venotomy site was closed with an interrupted 4-0 Vicryl suture. The port pocket incision was closed with interrupted 2-0 Vicryl suture. The skin was opposed with a running subcuticular 4-0 Vicryl suture. Dermabond and Steri-strips were applied to both incisions. Dressings were applied. The Huber needle was then reinserted to allow for immediate in hospitalization usage. The patient tolerated the procedure well without immediate post procedural complication. FINDINGS: Under direct ultrasound guidance, five 18 gauge core needle biopsies were obtained the dominant hypoechoic mass involving the dome liver. After catheter placement, the tip lies within the superior cavoatrial junction. The catheter aspirates and flushes normally and is ready for immediate use. IMPRESSION: 1. Technically successful ultrasound-guided biopsy of dominant hypoechoic mass within the dome of the  left lobe of the liver. 2. Successful placement of a right internal jugular approach power injectable Port-A-Cath. The port a Catheter was left accessed for immediate usage. Electronically Signed   By: Sandi Mariscal M.D.   On: 02/25/2021 09:13   MR CARDIAC MORPHOLOGY W WO CONTRAST  Result Date: 02/21/2021 CLINICAL DATA:  RV filling defect on CT EXAM: CARDIAC MRI TECHNIQUE: The patient was scanned on a 1.5 Tesla Siemens magnet. A dedicated cardiac coil was used. Functional imaging was done using Fiesta sequences. 2,3, and 4 chamber views were done to assess for RWMA's. Modified Simpson's rule using a short axis stack was used to calculate an ejection fraction on a dedicated work Conservation officer, nature. The patient received 10 cc of Gadavist. After 10 minutes inversion recovery sequences were used to assess for infiltration and scar tissue. CONTRAST:  10 cc  of Gadavist FINDINGS: Left ventricle: -Normal size -Normal systolic function -Elevated ECV (36%) -No LGE LV EF:  53% (Normal 56-78%) Absolute volumes: LV EDV: 127m (Normal 52-141 mL) LV ESV: 747m(Normal 13-51 mL) LV SV: 9028mNormal 33-97 mL) CO: 7.4L/min (Normal 2.7-6.0 L/min) Indexed volumes: LV EDV: 38m53m-m (Normal 41-81 mL/sq-m) LV ESV: 34mL54mm (Normal 12-21 mL/sq-m) LV SV: 39mL/44m (Normal 26-56 mL/sq-m) CI: 3.2L/min/sq-m (Normal 1.8-3.8 L/min/sq-m) Right ventricle: -Normal size and systolic function -Mass in mid RV attached to interventricular septum measuring 16mm x55m. Ma88mis heterogenous, with central area of low T1/high T2 and peripheral area of high T1. Dark on EGE but heterogenous area of enhancement on LGE RV EF: 59% (Normal 47-80%) Absolute volumes: RV EDV: 155mL (No25m 58-154 mL) RV ESV: 64mL (Nor23m12-68 mL) RV SV: 91mL (Norm5m5-98 mL) CO: 7.5L/min (Normal 2.7-6 L/min) Indexed volumes: RV EDV: 67mL/sq-m (71mal 48-87 mL/sq-m) RV ESV: 28mL/sq-m (N48ml 11-28  mL/sq-m) RV SV: 50m/sq-m (Normal 27-57 mL/sq-m) CI: 3.2L/min/sq-m  (Normal 1.8-3.8 L/min/sq-m) Left atrium: Mild enlargement Right atrium: Normal size Mitral valve: No regurgitation Aortic valve: No regurgitation Tricuspid valve: No regurgitation Pulmonic valve: No regurgitation Aorta: Normal proximal ascending aorta Pericardium: Normal IMPRESSION: 1. Mass in mid RV attached to interventricular septum measures 1534mx 34m57mMass appears heterogenous, with area of enhancement on LGE imaging. Given patient with suspected malignancy, mass is concerning for metastatic tumor 2. Normal LV size and systolic function (EF 53%29%No late gadolinium enhancement to suggest myocardial scar 3.  Normal RV size and systolic function (EF 59%52%lectronically Signed   By: ChrOswaldo Milian   On: 02/21/2021 01:00   ECHOCARDIOGRAM COMPLETE  Result Date: 02/17/2021    ECHOCARDIOGRAM REPORT   Patient Name:   Wendy DERRINGERte of Exam: 02/17/2021 Medical Rec #:  [id number]    Height:       64.0 in Accession #:    [id number]   Weight:       258.1 lb Date of Birth:  3/214-Jul-1967    BSA:          2.180 m Patient Age:    [age]        BP:           117/77 mmHg Patient Gender: F               HR:           69 bpm. Exam Location:  Inpatient Procedure: 2D Echo, 3D Echo, Cardiac Doppler and Color Doppler Indications:    R94.31 Abnormal EKG  History:        Patient has prior history of Echocardiogram examinations, most                 recent 04/18/2008. COPD, Signs/Symptoms:Chest Pain; Risk                 Factors:Hypertension, Dyslipidemia and Current Smoker.  Sonographer:    TinRoseanna RainbowCS Referring Phys: [id number] Plazaonographer Comments: Technically difficult study due to poor echo windows and patient is morbidly obese. Image acquisition challenging due to patient body habitus. IMPRESSIONS  1. Left ventricular ejection fraction, by estimation, is 55 to 60%. The left ventricle has normal function. The left ventricle has no regional wall motion abnormalities. Left ventricular diastolic  parameters were normal.  2. Right ventricular systolic function is normal. The right ventricular size is normal.  3. The mitral valve is normal in structure. No evidence of mitral valve regurgitation.  4. The aortic valve is normal in structure. Aortic valve regurgitation is not visualized. FINDINGS  Left Ventricle: Left ventricular ejection fraction, by estimation, is 55 to 60%. The left ventricle has normal function. The left ventricle has no regional wall motion abnormalities. The left ventricular internal cavity size was normal in size. There is  no left ventricular hypertrophy. Left ventricular diastolic parameters were normal. Right Ventricle: The right ventricular size is normal. No increase in right ventricular wall thickness. Right ventricular systolic function is normal. Left Atrium: Left atrial size was normal in size. Right Atrium: Right atrial size was normal in size. Pericardium: There is no evidence of pericardial effusion. Mitral Valve: The mitral valve is normal in structure. No evidence of mitral valve regurgitation. Tricuspid Valve: The tricuspid valve is grossly normal. Tricuspid valve regurgitation is trivial. Aortic Valve: The aortic valve is normal in structure. Aortic valve  regurgitation is not visualized. Pulmonic Valve: The pulmonic valve was grossly normal. Pulmonic valve regurgitation is not visualized. Aorta: The aortic root and ascending aorta are structurally normal, with no evidence of dilitation. IAS/Shunts: The atrial septum is grossly normal.  LEFT VENTRICLE PLAX 2D LVIDd:         4.70 cm      Diastology LVIDs:         3.00 cm      LV e' medial:    6.85 cm/s LV PW:         1.30 cm      LV E/e' medial:  13.7 LV IVS:        1.20 cm      LV e' lateral:   9.79 cm/s LVOT diam:     2.00 cm      LV E/e' lateral: 9.6 LV SV:         88 LV SV Index:   40 LVOT Area:     3.14 cm  LV Volumes (MOD) LV vol d, MOD A2C: 97.7 ml LV vol d, MOD A4C: 146.0 ml LV vol s, MOD A2C: 42.0 ml LV vol s, MOD  A4C: 46.4 ml LV SV MOD A2C:     55.7 ml LV SV MOD A4C:     146.0 ml LV SV MOD BP:      76.1 ml RIGHT VENTRICLE             IVC RV S prime:     12.80 cm/s  IVC diam: 1.50 cm TAPSE (M-mode): 2.1 cm LEFT ATRIUM             Index       RIGHT ATRIUM           Index LA diam:        3.30 cm 1.51 cm/m  RA Area:     12.00 cm LA Vol (A2C):   46.6 ml 21.37 ml/m RA Volume:   21.60 ml  9.91 ml/m LA Vol (A4C):   29.6 ml 13.58 ml/m LA Biplane Vol: 37.7 ml 17.29 ml/m  AORTIC VALVE LVOT Vmax:   131.00 cm/s LVOT Vmean:  91.900 cm/s LVOT VTI:    0.279 m  AORTA Ao Root diam: 3.70 cm MITRAL VALVE MV Area (PHT): 4.06 cm    SHUNTS MV Decel Time: 187 msec    Systemic VTI:  0.28 m MV E velocity: 93.60 cm/s  Systemic Diam: 2.00 cm MV A velocity: 84.40 cm/s MV E/A ratio:  1.11 Mertie Moores MD Electronically signed by Mertie Moores MD Signature Date/Time: 02/17/2021/3:41:34 PM    Final    IR IMAGING GUIDED PORT INSERTION  Result Date: 02/25/2021 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 Please perform ultrasound-guided liver mass biopsy for tissue diagnostic purposes. Additionally please perform image guided Port a catheter placement for durable intravenous access for the initiation of chemotherapy. EXAM: 1. ULTRASOUND-GUIDED LIVER LESION BIOPSY 2. IMPLANTED PORT A CATH PLACEMENT WITH ULTRASOUND AND FLUOROSCOPIC GUIDANCE COMPARISON:  CT abdomen and pelvis - 02/16/2021; abdominal MRI - 02/21/2021 MEDICATIONS: None ANESTHESIA/SEDATION: Moderate (conscious) sedation was employed during this procedure. A total of Versed 4 mg and Fentanyl 150 mcg was administered intravenously. Moderate Sedation Time: 50 minutes. The patient's level of consciousness and vital signs were monitored continuously by radiology nursing throughout the procedure under my direct supervision. CONTRAST:  None FLUOROSCOPY TIME:  12 seconds (13 mGy) COMPLICATIONS: None immediate. PROCEDURE: The procedures, risks,  benefits,  and alternatives were explained to the patient. Questions regarding the procedure were encouraged and answered. The patient understands and consents to the procedure. Sonographic evaluation of the abdomen demonstrates an at least 3.5 x 3.4 cm hypoechoic mass within the dome of the left lobe the liver correlating with the dominant mass seen on preceding abdominal MRI image 31, series 26. The skin overlying the midline of the upper abdomen was prepped and draped in usual sterile fashion. After the overlying soft tissues were anesthetized with 1% lidocaine with epinephrine, a 17 gauge coaxial needle was advanced into the hypoechoic hepatic mass and five 18 gauge core needle biopsies were obtained under direct ultrasound guidance. The coaxial needle was removed following the administration of a Gel-Foam slurry. Postprocedural imaging was obtained. A dressing was applied. As the patient tolerated the procedure well, we then proceeded with placement of the port a catheter. _________________________________________________________ The right neck and chest were prepped with chlorhexidine in a sterile fashion, and a sterile drape was applied covering the operative field. Maximum barrier sterile technique with sterile gowns and gloves were used for the procedure. A timeout was performed prior to the initiation of the procedure. Local anesthesia was provided with 1% lidocaine with epinephrine. After creating a small venotomy incision, a micropuncture kit was utilized to access the internal jugular vein. Real-time ultrasound guidance was utilized for vascular access including the acquisition of a permanent ultrasound image documenting patency of the accessed vessel. The microwire was utilized to measure appropriate catheter length. A subcutaneous port pocket was then created along the upper chest wall utilizing a combination of sharp and blunt dissection. The pocket was irrigated with sterile saline. A single lumen  "standard sized" power injectable port was chosen for placement. The 8 Fr catheter was tunneled from the port pocket site to the venotomy incision. The port was placed in the pocket. The external catheter was trimmed to appropriate length. At the venotomy, an 8 Fr peel-away sheath was placed over a guidewire under fluoroscopic guidance. The catheter was then placed through the sheath and the sheath was removed. Final catheter positioning was confirmed and documented with a fluoroscopic spot radiograph. The port was accessed with a Huber needle and noted to easily aspirate and flush. The venotomy site was closed with an interrupted 4-0 Vicryl suture. The port pocket incision was closed with interrupted 2-0 Vicryl suture. The skin was opposed with a running subcuticular 4-0 Vicryl suture. Dermabond and Steri-strips were applied to both incisions. Dressings were applied. The Huber needle was then reinserted to allow for immediate in hospitalization usage. The patient tolerated the procedure well without immediate post procedural complication. FINDINGS: Under direct ultrasound guidance, five 18 gauge core needle biopsies were obtained the dominant hypoechoic mass involving the dome liver. After catheter placement, the tip lies within the superior cavoatrial junction. The catheter aspirates and flushes normally and is ready for immediate use. IMPRESSION: 1. Technically successful ultrasound-guided biopsy of dominant hypoechoic mass within the dome of the left lobe of the liver. 2. Successful placement of a right internal jugular approach power injectable Port-A-Cath. The port a Catheter was left accessed for immediate usage. Electronically Signed   By: Sandi Mariscal M.D.   On: 02/25/2021 09:13    ASSESSMENT AND PLAN: 1.    Pancreatic/duodenal mass, biopsy pending -CT abdomen/pelvis without contrast 02/16/2021-multiple large hypoechoic masses in the patient's liver consistent metastatic disease, ill-defined masslike area  in the pancreatic head measuring approximate 4.7 cm, possible filling defect in the right ventricle. -  EGD 02/19/2021-large infiltrative mass with bleeding in the second portion of the duodenum, appears to be arising near the ampulla, partially obstructive with friable mucosa. -Flexible sigmoidoscopy 02/19/2021-diverticulosis in the sigmoid colon, nonbleeding external and internal hemorrhoids -Cardiac CT 02/20/2021-mass in the mid RV attached to the interventricular septum measuring 16 mm x 6 mm and concerning for metastatic tumor -MRI abdomen with/without contrast 02/21/2021-mass in the posterior pancreatic head measuring 4.3 x 3.8 cm with obstruction of the central pancreatic duct and diffuse dilatation up to 6 mm consistent with pancreatic adenocarcinoma, liver lesions the largest mass of the central left lobe measuring 9.9 x 9.2 cm consistent with hepatic metastatic disease, hepatomegaly. 2.  Iron deficiency anemia -Feraheme 510 mg 02/20/2021 3.  Protein calorie malnutrition 4.  Possible right ventricular filling defect on noncontrast CT scan 5.  Hypertension 6.  History of CVA 7.  Hyperlipidemia 8.  Hypothyroidism 9.  Morbid obesity 10.  Asthma 11.  Migraines  Wendy Hoffman appears stable.  Biopsy of the duodenal mass and the liver biopsy are consistent with adenocarcinoma.  Likely metastatic pancreatic cancer given markedly elevated CA 19.9 elevated at 17,515. She is aware that no therapy will be curative.  We will consider her for systemic chemotherapy as an outpatient.  Port-A-Cath has been placed in anticipation of chemotherapy.  She is receiving radiation to the duodenal mass under the care of Dr. Lisbeth Renshaw.  She received a dose of Feraheme 510 mg IV on 02/20/2021.  He received a unit of blood 02/26/2021.  Hemoglobin up to 8.4 today.  We will consider her for an additional dose of Feraheme 510 mg IV as an outpatient.  Pain is much better controlled with addition of MS Contin 30 mg twice daily and  as needed liquid morphine.  Recommend for her to continue these medications as an outpatient.  Continue to monitor nutrition closely.  She is currently tolerating a soft diet.  She is noted to have oral candidiasis and will start her on fluconazole 100 mg daily x4 days.  Recommendations: 1.  Continue MS Contin and Roxanol. 2.  Continue radiation under the care of Dr. Lisbeth Renshaw. 3.  We will arrange for genetic counseling as an outpatient. 4.  Fluconazole 100 mg daily x4 days. 5.  We will request molecular testing on liver biopsy tissue.  The patient will be discharged home from our standpoint since her pain is now controlled and she is tolerating an oral diet.  Scheduling message has been sent to arrange for medical oncology follow-up on 3/25.   LOS: 11 days   Mikey Bussing, DNP, AGPCNP-BC, AOCNP 02/27/21   Wendy Hoffman was interviewed and examined.  Her pain is under better control with MS Contin.  I recommend continuing MS Contin/Roxanol.  She will complete a course of Diflucan for oral candidiasis.  Outpatient follow-up will be scheduled at the Cancer center next week  I was present for greater than 50% of today's visit.  I performed medical decision making.

## 2021-02-27 NOTE — Progress Notes (Signed)
PROGRESS NOTE    Wendy Hoffman  YNW:[id number] DOB: 1966/10/11 DOA: 02/16/2021 PCP: Patient, No Pcp Per     Brief Narrative:  Wendy Hoffman is a 55 y.o. BF PMHx Morbid obesity, HTN, HLD, cervical cancer/hysterectomy, chronic iron deficiency anemia, anxiety, osteoarthritis, asthma/COPD, hypothyroidism, insomnia, migraine headaches.   Patient presented to the ED on 3/7 with nausea, vomiting, abdominal pain, bloating and 38 pound weight loss in 2 months. In the ED, fecal occult blood test was positive, hemoglobin low at 5.8. CT abdomen/pelvis without contrast showed multiple large hypoechoic masses in the patient's liver consistent with metastatic disease. There is an ill-defined masslike area in the pancreatic head measuring about 4.7 cm.     Subjective: A/O x4, very anxious about her new diagnosis of stage IV metastatic pancreas neoplasm.   Assessment & Plan: Covid vaccination; unvaccinated   Principal Problem:   GI bleeding Active Problems:   Hypertension   High cholesterol   Hypothyroidism   Symptomatic anemia   Class 3 obesity with current BMI at 44.30 kg/m   COPD (chronic obstructive pulmonary disease) (HCC)   Malignant neoplasm of head of pancreas (HCC)   Aortic atherosclerosis (HCC)   Heme positive stool   Iron deficiency anemia due to chronic blood loss   Epigastric pain   Morbidly obese (HCC)   Duodenal mass  -Likely malignancy with liver metastasis -Presented with abdominal pain, weight loss. -3/7 CT scan showed pancreatic head mass of 4.7 cm with multiple liver mets -3/10 EGD showed large infiltrative mass with bleeding in the second portion of the duodenum, arising near the ampulla, partially obstructive with friable mucosa.  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 -MRI 3/12 confirmed the finding and also showed obstruction of the central pancreatic duct with diffuse dilation up to 6 mm consistent with pancreatic adenocarcinoma.   Also confirmed hepatic mets. There is mild dilation of the CBD without obstruction which probably is related to prior cholecystectomy status. -3/15, patient underwent biopsy of the liver mass as well as Port-A-Cath placement. -Started on radiation treatment. -Planned for chemo as an outpatient.-Able to tolerate soft diet.  No evidence of gastric outlet obstruction so far. -Continue pain control.  Minimize use of IV pain medicines.  Started on MS Contin twice daily today.  Also on as needed Robinul.  -3/18 patient to receive XRT today.  If patient feels well in the a.m. will discharge  Acute on chronic GI bleeding/Iron deficiency anemia -Hemoglobin on admission was low at 5 on admission. - 3/7 transfuse 2 units PRBC -3/11 transfuse 1 unit PRBC -3/11IV Feraheme given  -3/17 transfuse 1 unit PRBC  -Patient may be slowly bleeding from the duodenal mass..    Lab Results  Component Value Date   HGB 8.4 (L) 02/27/2021   HGB 7.1 (L) 02/26/2021   HGB 6.9 (LL) 02/26/2021   HGB 7.5 (L) 02/25/2021   HGB 8.5 (L) 02/24/2021  -Stable  Filling defect on the right ventricle -Per CT abdomen afternoon admission. -I discussed with cardiologist Dr. Acie Fredrickson and Dr. Reather Littler.  Underwent cardiac MRI on 3/11.  MRI showed 16 mm x 6 mm mass in the RV attached to the interventricular septum, concerning for metastatic tumor.   Hypertension -Amlodipine on hold.  Blood pressure stable without meds  High cholesterol/Aortic atherosclerosis -Continuerosuvastatin.  Hypothyroidism -Continue Synthroid  Morbid obesity  -Body mass index is 46.36 kg/m. Patient has been advised to make an attempt to improve diet and exercise patterns to aid in weight loss.  COPD -  Supplemental oxygen as needed. -Bronchodilators as needed.  Morbidly obese    DVT prophylaxis: SCD Code Status: Full Family Communication:  Status is: Inpatient    Dispo: The patient is from: Home              Anticipated d/c is to:  Home              Anticipated d/c date is: 3/19              Patient currently unstable      Consultants:  GI, oncology, IR and radiation oncology consult   Procedures/Significant Events:  3/10 duodenal mass biopsy; adenocarcinoma moderate to poorly differentiated 3/15 liver needle core biopsy; adenocarcinoma    I have personally reviewed and interpreted all radiology studies and my findings are as above.  VENTILATOR SETTINGS:    Cultures   Antimicrobials:    Devices    LINES / TUBES:      Continuous Infusions:    Objective: Vitals:   02/26/21 2021 02/26/21 2122 02/27/21 0426 02/27/21 1312  BP:  123/67 113/73 119/78  Pulse:  94 86 87  Resp:  19 19 20   Temp:  98.5 F (36.9 C) 98.4 F (36.9 C) 98.3 F (36.8 C)  TempSrc:  Oral Oral Oral  SpO2: 96% 100% 94% 97%  Weight:      Height:        Intake/Output Summary (Last 24 hours) at 02/27/2021 1915 Last data filed at 02/27/2021 1700 Gross per 24 hour  Intake 145 ml  Output 1 ml  Net 144 ml   Filed Weights   02/16/21 1214 02/16/21 1913 02/23/21 2200  Weight: 118.4 kg 117.1 kg 122.5 kg    Examination:  General: A/O x4 No acute respiratory distress Eyes: negative scleral hemorrhage, negative anisocoria, negative icterus ENT: Negative Runny nose, negative gingival bleeding, Neck:  Negative scars, masses, torticollis, lymphadenopathy, JVD Lungs: Clear to auscultation bilaterally without wheezes or crackles Cardiovascular: Regular rate and rhythm without murmur gallop or rub normal S1 and S2 Abdomen: MORBIDLY OBESE, positive abdominal pain, nondistended, positive soft, bowel sounds, no rebound, no ascites, no appreciable mass Extremities: No significant cyanosis, clubbing, or edema bilateral lower extremities Skin: Negative rashes, lesions, ulcers Psychiatric:  Negative depression, negative anxiety, negative fatigue, negative mania  Central nervous system:  Cranial nerves II through XII intact,  tongue/uvula midline, all extremities muscle strength 5/5, sensation intact throughout, negative dysarthria, negative expressive aphasia, negative receptive aphasia.  .     Data Reviewed: Care during the described time interval was provided by me .  I have reviewed this patient's available data, including medical history, events of note, physical examination, and all test results as part of my evaluation.  CBC: Recent Labs  Lab 02/23/21 0350 02/24/21 0412 02/25/21 0506 02/26/21 0421 02/26/21 0624 02/27/21 0916  WBC 9.5 10.8* 9.8 9.4  --  8.8  NEUTROABS 6.4 8.0* 7.2 7.1  --  6.7  HGB 7.5* 8.5* 7.5* 6.9* 7.1* 8.4*  HCT 25.0* 29.3* 26.2* 23.4* 24.2* 28.4*  MCV 78.9* 84.2 83.7 83.6  --  83.8  PLT 292 342 303 259  --  967   Basic Metabolic Panel: Recent Labs  Lab 02/23/21 0350 02/24/21 0412 02/25/21 0506 02/26/21 0421 02/27/21 0916  NA 139 138 140 142 137  K 3.3* 3.7 3.7 3.8 4.0  CL 102 100 103 104 99  CO2 32 26 27 31 29   GLUCOSE 94 90 105* 98 105*  BUN 6 7 8  9  9  CREATININE 0.54 0.72 0.66 0.56 0.56  CALCIUM 9.1 9.3 9.3 9.3 9.2  MG  --   --   --   --  2.0  PHOS  --   --   --   --  4.4   GFR: Estimated Creatinine Clearance: 106.2 mL/min (by C-G formula based on SCr of 0.56 mg/dL). Liver Function Tests: Recent Labs  Lab 02/27/21 0916  AST 44*  ALT 30  ALKPHOS 80  BILITOT 0.7  PROT 6.7  ALBUMIN 3.2*   No results for input(s): LIPASE, AMYLASE in the last 168 hours. No results for input(s): AMMONIA in the last 168 hours. Coagulation Profile: Recent Labs  Lab 02/20/21 2006  INR 0.9   Cardiac Enzymes: No results for input(s): CKTOTAL, CKMB, CKMBINDEX, TROPONINI in the last 168 hours. BNP (last 3 results) No results for input(s): PROBNP in the last 8760 hours. HbA1C: No results for input(s): HGBA1C in the last 72 hours. CBG: No results for input(s): GLUCAP in the last 168 hours. Lipid Profile: No results for input(s): CHOL, HDL, LDLCALC, TRIG, CHOLHDL,  LDLDIRECT in the last 72 hours. Thyroid Function Tests: No results for input(s): TSH, T4TOTAL, FREET4, T3FREE, THYROIDAB in the last 72 hours. Anemia Panel: No results for input(s): VITAMINB12, FOLATE, FERRITIN, TIBC, IRON, RETICCTPCT in the last 72 hours. Sepsis Labs: No results for input(s): PROCALCITON, LATICACIDVEN in the last 168 hours.  No results found for this or any previous visit (from the past 240 hour(s)).       Radiology Studies: No results found.      Scheduled Meds: . (feeding supplement) PROSource Plus  30 mL Oral TID BM  . Chlorhexidine Gluconate Cloth  6 each Topical Daily  . feeding supplement  237 mL Oral BID BM  . fluconazole  100 mg Oral Daily  . gabapentin  300 mg Oral QHS  . mometasone-formoterol  2 puff Inhalation BID  . morphine  30 mg Oral Q12H  . multivitamin with minerals  1 tablet Oral Daily  . pantoprazole  40 mg Oral BID  . zolpidem  5 mg Oral QHS   Continuous Infusions:    LOS: 11 days    Time spent:40 min    WOODS, Geraldo Docker, MD [facility]   If 7PM-7AM, please contact night-coverage 02/27/2021, 7:15 PM

## 2021-02-27 NOTE — Telephone Encounter (Signed)
Scheduled per 03/18 scheduled message, patient has been called and notified of 03/25 appointments.

## 2021-02-28 DIAGNOSIS — E7800 Pure hypercholesterolemia, unspecified: Secondary | ICD-10-CM

## 2021-02-28 DIAGNOSIS — E038 Other specified hypothyroidism: Secondary | ICD-10-CM

## 2021-02-28 DIAGNOSIS — B370 Candidal stomatitis: Secondary | ICD-10-CM

## 2021-02-28 LAB — CBC WITH DIFFERENTIAL/PLATELET
Abs Immature Granulocytes: 0.04 10*3/uL (ref 0.00–0.07)
Basophils Absolute: 0 10*3/uL (ref 0.0–0.1)
Basophils Relative: 0 %
Eosinophils Absolute: 0.3 10*3/uL (ref 0.0–0.5)
Eosinophils Relative: 4 %
HCT: 27.1 % — ABNORMAL LOW (ref 36.0–46.0)
Hemoglobin: 8 g/dL — ABNORMAL LOW (ref 12.0–15.0)
Immature Granulocytes: 1 %
Lymphocytes Relative: 12 %
Lymphs Abs: 0.9 10*3/uL (ref 0.7–4.0)
MCH: 25.1 pg — ABNORMAL LOW (ref 26.0–34.0)
MCHC: 29.5 g/dL — ABNORMAL LOW (ref 30.0–36.0)
MCV: 85 fL (ref 80.0–100.0)
Monocytes Absolute: 0.5 10*3/uL (ref 0.1–1.0)
Monocytes Relative: 6 %
Neutro Abs: 5.9 10*3/uL (ref 1.7–7.7)
Neutrophils Relative %: 77 %
Platelets: 299 10*3/uL (ref 150–400)
RBC: 3.19 MIL/uL — ABNORMAL LOW (ref 3.87–5.11)
RDW: 25.9 % — ABNORMAL HIGH (ref 11.5–15.5)
WBC: 7.7 10*3/uL (ref 4.0–10.5)
nRBC: 0 % (ref 0.0–0.2)

## 2021-02-28 LAB — COMPREHENSIVE METABOLIC PANEL
ALT: 26 U/L (ref 0–44)
AST: 31 U/L (ref 15–41)
Albumin: 2.9 g/dL — ABNORMAL LOW (ref 3.5–5.0)
Alkaline Phosphatase: 72 U/L (ref 38–126)
Anion gap: 10 (ref 5–15)
BUN: 8 mg/dL (ref 6–20)
CO2: 28 mmol/L (ref 22–32)
Calcium: 9.1 mg/dL (ref 8.9–10.3)
Chloride: 101 mmol/L (ref 98–111)
Creatinine, Ser: 0.67 mg/dL (ref 0.44–1.00)
GFR, Estimated: >60 mL/min (ref >60)
Glucose, Bld: 86 mg/dL (ref 70–99)
Potassium: 3.8 mmol/L (ref 3.5–5.1)
Sodium: 139 mmol/L (ref 135–145)
Total Bilirubin: 0.8 mg/dL (ref 0.3–1.2)
Total Protein: 6.1 g/dL — ABNORMAL LOW (ref 6.5–8.1)

## 2021-02-28 LAB — PHOSPHORUS: Phosphorus: 4.3 mg/dL (ref 2.5–4.6)

## 2021-02-28 LAB — MAGNESIUM: Magnesium: 2.1 mg/dL (ref 1.7–2.4)

## 2021-02-28 MED ORDER — MORPHINE SULFATE ER 30 MG PO TBCR: 30.0000 mg | EXTENDED_RELEASE_TABLET | Freq: Two times a day (BID) | ORAL | 0 refills | Status: DC

## 2021-02-28 MED ORDER — FLUCONAZOLE 100 MG PO TABS: 100.0000 mg | ORAL_TABLET | Freq: Every day | ORAL | 0 refills | Status: DC

## 2021-02-28 MED ORDER — ONDANSETRON HCL 4 MG PO TABS: 4.0000 mg | ORAL_TABLET | Freq: Four times a day (QID) | ORAL | 0 refills | Status: DC | PRN

## 2021-02-28 MED ORDER — HYDROCORTISONE 1 % EX CREA: TOPICAL_CREAM | Freq: Three times a day (TID) | CUTANEOUS | 0 refills | Status: DC | PRN

## 2021-02-28 MED ORDER — PANTOPRAZOLE SODIUM 40 MG PO TBEC: 40.0000 mg | DELAYED_RELEASE_TABLET | Freq: Two times a day (BID) | ORAL | 0 refills | Status: DC

## 2021-02-28 MED ORDER — POLYETHYLENE GLYCOL 3350 17 G PO PACK: 17.0000 g | PACK | Freq: Every day | ORAL | 0 refills | Status: DC | PRN

## 2021-02-28 MED ORDER — HEPARIN SOD (PORK) LOCK FLUSH 100 UNIT/ML IV SOLN
500.0000 [IU] | INTRAVENOUS | Status: AC | PRN
  Administered 2021-02-28: 500 [IU]
  Filled 2021-02-28: qty 5

## 2021-02-28 MED ORDER — OXYCODONE-ACETAMINOPHEN 7.5-325 MG PO TABS: 1.0000 | ORAL_TABLET | ORAL | 0 refills | Status: DC | PRN

## 2021-02-28 NOTE — Progress Notes (Signed)
Pt discharged home today per Dr. Sherral Hammers. Pt's port flushed and deaccessed per IV team. Pt's VSS. Pt provided with home medication list, discharge instructions and prescriptions. Verbalized understanding. Pt left floor via WC in stable condition accompanied by NT.

## 2021-02-28 NOTE — Plan of Care (Signed)
  Problem: Clinical Measurements: Goal: Ability to maintain clinical measurements within normal limits will improve Outcome: Completed/Met Goal: Will remain free from infection Outcome: Completed/Met Goal: Diagnostic test results will improve Outcome: Completed/Met Goal: Respiratory complications will improve Outcome: Completed/Met Goal: Cardiovascular complication will be avoided Outcome: Completed/Met   Problem: Activity: Goal: Risk for activity intolerance will decrease Outcome: Completed/Met   Problem: Nutrition: Goal: Adequate nutrition will be maintained Outcome: Completed/Met   Problem: Coping: Goal: Level of anxiety will decrease Outcome: Completed/Met   Problem: Elimination: Goal: Will not experience complications related to bowel motility Outcome: Completed/Met Goal: Will not experience complications related to urinary retention Outcome: Completed/Met   Problem: Pain Managment: Goal: General experience of comfort will improve Outcome: Completed/Met   Problem: Safety: Goal: Ability to remain free from injury will improve Outcome: Completed/Met   Problem: Skin Integrity: Goal: Risk for impaired skin integrity will decrease Outcome: Completed/Met   Problem: Education: Goal: Ability to identify signs and symptoms of gastrointestinal bleeding will improve Outcome: Completed/Met   Problem: Bowel/Gastric: Goal: Will show no signs and symptoms of gastrointestinal bleeding Outcome: Completed/Met   Problem: Fluid Volume: Goal: Will show no signs and symptoms of excessive bleeding Outcome: Completed/Met   Problem: Clinical Measurements: Goal: Complications related to the disease process, condition or treatment will be avoided or minimized Outcome: Completed/Met

## 2021-02-28 NOTE — Discharge Summary (Signed)
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 -3/7 CT scan showed pancreatic head mass of 4.7 cm with multiple liver mets -3/10 EGD showed large infiltrative mass with bleeding in the second portion of the duodenum, arising near the ampulla, partially obstructive with friable mucosa. 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 -MRI 3/12 confirmed the finding and also showed obstruction of the central pancreatic duct with diffuse dilation up to 6 mm consistent with pancreatic adenocarcinoma. Also confirmed hepatic mets. There is mild dilation of the CBD without obstruction which probably is related to prior cholecystectomy status. -3/15, patient underwent biopsy of the liver mass as well as Port-A-Cath placement. -Started on radiation treatment. -Planned for chemoas an outpatient.-Able to tolerate soft diet. No evidence of gastric outlet obstruction so far. -Continue pain control. Minimize use of IV pain medicines. Started on MS Contin twice daily today. Also on as needed Robinul.  -3/18 patient to receive XRT today.  If patient feels well in the a.m. will discharge  Acute on chronic GI bleeding/Iron deficiency anemia -Hemoglobin on admission was low at 5 on admission. - 3/7 transfuse 2 units PRBC -3/11 transfuse 1 unit PRBC -3/11IV Feraheme given  -3/17 transfuse 1 unit PRBC  -Patient may be slowly bleeding from the duodenal mass..   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 -I discussed with cardiologist Dr. NAcie Fredricksonand Dr. SReather Littler Underwent cardiac MRI on 3/11. MRI showed 16 mm x 6 mm mass in the RV  attached to the interventricular septum, concerning for metastatic tumor.   Hypertension -Amlodipine on hold. Blood pressure stablewithout meds  High cholesterol/Aortic atherosclerosis -Continuerosuvastatin.  Hypothyroidism -Continue Synthroid  Morbid obesity  -Body mass index is 46.36 kg/m.Patient has been advised to make an attempt to improve diet and exercise patterns to aid in weight loss.  COPD -Supplemental oxygen as needed. -Bronchodilators as needed.  Morbidly obese     Discharge Diagnoses:  Principal Problem:   GI bleeding Active Problems:   Hypertension   High cholesterol   Hypothyroidism   Symptomatic anemia   Class 3 obesity with current BMI at 44.30 kg/m   COPD (chronic obstructive pulmonary disease) (HCC)   Malignant neoplasm of head of pancreas (HCC)   Aortic atherosclerosis (HCC)   Heme positive stool   Iron deficiency anemia due to chronic blood loss   Epigastric pain   Morbidly obese ([facility]   Discharge Condition: Stable  Diet recommendation: Regular diet  Filed Weights   02/16/21 1214 02/16/21 1913 02/23/21 2200  Weight: 118.4 kg 117.1 kg 122.5 kg    History of present illness:  Wendy Lockwoodis a 55y.o.BF PMHx Morbid obesity, HTN, HLD, cervical cancer/hysterectomy, chronic iron deficiency anemia, anxiety, osteoarthritis, asthma/COPD, hypothyroidism, insomnia, migraine headaches.   Patient presented to the ED on 3/7 with nausea, vomiting, abdominal pain, bloating and 38 pound weight loss in 2 months. In the ED, fecal occult blood test was positive, hemoglobin low at 5.8. CT abdomen/pelvis without contrast showed multiple large hypoechoic masses in the patient's liver consistent with metastatic disease. There is an ill-defined masslike area in the pancreatic head measuring about 4.7 cm.   Hospital Course:  See above  Procedures: 3/10 duodenal mass biopsy; adenocarcinoma moderate to  poorly differentiated 3/15 liver needle  core biopsy; adenocarcinoma   Consultations: GI Oncology IR  Radiation oncology    Cultures   3/7 SARS coronavirus negative 3/7 influenza A/B negative   Antibiotics Anti-infectives (From admission, onward)   Start     Stop   02/27/21 1100  fluconazole (DIFLUCAN) tablet 100 mg        03/03/21 0959       Discharge Exam: Vitals:   02/27/21 1941 02/27/21 2200 02/28/21 0451 02/28/21 1339  BP:  109/62 112/67 125/70  Pulse:  91 86 90  Resp:  '20 18 16  ' Temp:  98.4 F (36.9 C) 98.3 F (36.8 C) 98.6 F (37 C)  TempSrc:  Oral Oral Oral  SpO2: 93% 93% 97% 94%  Weight:      Height:        General: A/O x4 No acute respiratory distress Eyes: negative scleral hemorrhage, negative anisocoria, negative icterus ENT: Negative Runny nose, negative gingival bleeding, Neck:  Negative scars, masses, torticollis, lymphadenopathy, JVD Lungs: Clear to auscultation bilaterally without wheezes or crackles Cardiovascular: Regular rate and rhythm without murmur gallop or rub normal S1 and S2 Abdomen: MORBIDLY OBESE, positive abdominal pain, nondistended, positive soft, bowel sounds, no rebound, no ascites, no appreciable mass   Discharge Instructions   Allergies as of 02/28/2021      Reactions   Blueberry [vaccinium Angustifolium] Anaphylaxis   Mango Flavor Anaphylaxis   Reglan [metoclopramide] Nausea And Vomiting   Aspirin Rash   Penicillins Hives   Has patient had a PCN reaction causing immediate rash, facial/tongue/throat swelling, SOB or lightheadedness with hypotension: Yes Has patient had a PCN reaction causing severe rash involving mucus membranes or skin necrosis: No Has patient had a PCN reaction that required hospitalization No Has patient had a PCN reaction occurring within the last 10 years: No If all of the above answers are "NO", then may proceed with Cephalosporin use.      Medication List    STOP taking these medications   amLODipine 10 MG tablet Commonly known  as: NORVASC   Dexilant 60 MG capsule Generic drug: dexlansoprazole   HYDROcodone-acetaminophen 5-325 MG tablet Commonly known as: NORCO/VICODIN   rosuvastatin 5 MG tablet Commonly known as: CRESTOR     TAKE these medications   albuterol (2.5 MG/3ML) 0.083% nebulizer solution Commonly known as: PROVENTIL Take 3 mLs (2.5 mg total) by nebulization every 4 (four) hours as needed for wheezing or shortness of breath.   albuterol 108 (90 Base) MCG/ACT inhaler Commonly known as: VENTOLIN HFA INHALE 1-2 PUFFS INTO THE LUNGS EVERY 6 (SIX) HOURS AS NEEDED FOR WHEEZING OR SHORTNESS OF BREATH.   baclofen 10 MG tablet Commonly known as: LIORESAL Take 10 mg by mouth 2 (two) times daily.   cyclobenzaprine 10 MG tablet Commonly known as: FLEXERIL TAKE 1 TABLET (10 MG TOTAL) BY MOUTH 3 (THREE) TIMES DAILY AS NEEDED FOR MUSCLE SPASMS.   fluconazole 100 MG tablet Commonly known as: DIFLUCAN Take 1 tablet (100 mg total) by mouth daily. Start taking on: March 01, 2021   gabapentin 300 MG capsule Commonly known as: NEURONTIN Take 300 mg by mouth at bedtime.   hydrocortisone cream 1 % Apply topically 3 (three) times daily as needed for itching. Apply to affected area   ipratropium 0.02 % nebulizer solution Commonly known as: ATROVENT Take 0.25 mg by nebulization daily as needed for wheezing.   methocarbamol 750 MG tablet Commonly known as: ROBAXIN Take 750 mg by mouth 2 (two)  times daily.   morphine 30 MG 12 hr tablet Commonly known as: MS CONTIN Take 1 tablet (30 mg total) by mouth every 12 (twelve) hours.   omeprazole 20 MG capsule Commonly known as: PRILOSEC Take 1 capsule (20 mg total) by mouth daily.   ondansetron 4 MG disintegrating tablet Commonly known as: Zofran ODT Take 1 tablet (4 mg total) by mouth every 8 (eight) hours as needed for nausea or vomiting.   ondansetron 4 MG tablet Commonly known as: ZOFRAN Take 1 tablet (4 mg total) by mouth every 6 (six) hours as  needed for nausea.   oxyCODONE-acetaminophen 5-325 MG tablet Commonly known as: PERCOCET/ROXICET Take 1 tablet by mouth every 4 (four) hours as needed for moderate pain.   pantoprazole 40 MG tablet Commonly known as: PROTONIX Take 1 tablet (40 mg total) by mouth 2 (two) times daily.   polyethylene glycol 17 g packet Commonly known as: MIRALAX / GLYCOLAX Take 17 g by mouth daily as needed for moderate constipation.   predniSONE 20 MG tablet Commonly known as: DELTASONE Take 2 tablets (40 mg total) by mouth daily.   sucralfate 1 GM/10ML suspension Commonly known as: Carafate Take 10 mLs (1 g total) by mouth 4 (four) times daily -  with meals and at bedtime.   SUMAtriptan 100 MG tablet Commonly known as: IMITREX Take 100 mg by mouth daily as needed for migraine.   Symbicort 160-4.5 MCG/ACT inhaler Generic drug: budesonide-formoterol INHALE 2 PUFFS INTO THE LUNGS 2 (TWO) TIMES DAILY.   topiramate 50 MG tablet Commonly known as: Topamax Take 1 tablet (50 mg total) by mouth 2 (two) times daily.   zolpidem 10 MG tablet Commonly known as: AMBIEN Take 10 mg by mouth daily.      Allergies  Allergen Reactions  . Blueberry [Vaccinium Angustifolium] Anaphylaxis  . Mango Flavor Anaphylaxis  . Reglan [Metoclopramide] Nausea And Vomiting  . Aspirin Rash  . Penicillins Hives    Has patient had a PCN reaction causing immediate rash, facial/tongue/throat swelling, SOB or lightheadedness with hypotension: Yes Has patient had a PCN reaction causing severe rash involving mucus membranes or skin necrosis: No Has patient had a PCN reaction that required hospitalization No Has patient had a PCN reaction occurring within the last 10 years: No If all of the above answers are "NO", then may proceed with Cephalosporin use.       The results of significant diagnostics from this hospitalization (including imaging, microbiology, ancillary and laboratory) are listed below for reference.     Significant Diagnostic Studies: CT ABDOMEN PELVIS WO CONTRAST  Result Date: 02/16/2021 CLINICAL DATA:  Abdominal distension. EXAM: CT ABDOMEN AND PELVIS WITHOUT CONTRAST TECHNIQUE: Multidetector CT imaging of the abdomen and pelvis was performed following the standard protocol without IV contrast. COMPARISON:  None recent FINDINGS: Lower chest: The lung bases are clear.There is a possible filling defect within the right ventricle (axial series 2, image 4). The intracardiac blood pool is hypodense relative to the adjacent myocardium consistent with anemia. Hepatobiliary: Multiple large hypoechoic masses are noted in the patient's liver. The largest is located in the left hepatic lobe measures approximately 10 cm. The gallbladder is not well evaluated.There is no biliary ductal dilation. Pancreas: There is an ill-defined masslike area in the pancreatic head measuring approximately 4.7 cm (axial series 2, image 41). Spleen: Unremarkable. Adrenals/Urinary Tract: --Adrenal glands: Unremarkable. --Right kidney/ureter: No hydronephrosis or radiopaque kidney stones. --Left kidney/ureter: There is a punctate nonobstructing stone in the upper pole the  left kidney. --Urinary bladder: Unremarkable. Stomach/Bowel: --Stomach/Duodenum: No hiatal hernia or other gastric abnormality. Normal duodenal course and caliber. --Small bowel: Unremarkable. --Colon: Unremarkable. --Appendix: Normal. Vascular/Lymphatic: Atherosclerotic calcification is present within the non-aneurysmal abdominal aorta, without hemodynamically significant stenosis. --No retroperitoneal lymphadenopathy. --No mesenteric lymphadenopathy. --No pelvic or inguinal lymphadenopathy. Reproductive: Status post hysterectomy. No adnexal mass. Other: No ascites or free air. The abdominal wall is normal. Musculoskeletal. No acute displaced fractures. IMPRESSION: 1. Multiple large hypoechoic masses in the patient's liver, consistent with metastatic disease. These are  amenable to percutaneous biopsy as clinically indicated. 2. Ill-defined masslike area in the pancreatic head measuring approximately 4.7 cm. This is concerning for pancreatic adenocarcinoma. Exam limited by lack of IV contrast. A follow-up contrast enhanced MRI would be useful for further evaluation and staging. 3. Possible filling defect within the right ventricle. Recommend further evaluation with a dedicated cardiac echo. 4. Anemia. 5. Punctate nonobstructing stone in the upper pole of the left kidney. Aortic Atherosclerosis (ICD10-I70.0). Electronically Signed   By: Constance Holster M.D.   On: 02/16/2021 16:04   MR ABDOMEN W WO CONTRAST  Result Date: 02/21/2021 CLINICAL DATA:  Pancreatic cancer, metastatic disease evaluation EXAM: MRI ABDOMEN WITHOUT AND WITH CONTRAST TECHNIQUE: Multiplanar multisequence MR imaging of the abdomen was performed both before and after the administration of intravenous contrast. CONTRAST:  60m GADAVIST GADOBUTROL 1 MMOL/ML IV SOLN COMPARISON:  CT abdomen pelvis, 02/16/2021 FINDINGS: Lower chest: Trace bilateral pleural effusions. Hepatobiliary: Hepatomegaly, maximum coronal span 22.4 cm. There are faintly contrast enhancing, intrinsically T1 and T2 hyperintense masses of varying sizes throughout the liver, largest mass of the central left lobe measuring 9.9 x 9.2 cm (series 5, image 12). These are generally isosignal, isoenhancing to pancreatic head mass. Status post cholecystectomy. Mild dilatation of the common bile duct, measuring up to 8 mm and tapering to the ampulla. Pancreas: There is a mass of the posterior pancreatic head measuring 4.3 x 3.8 cm with obstruction of the central pancreatic duct and diffuse dilatation up to 6 mm. Spleen:  Within normal limits in size and appearance. Adrenals/Urinary Tract: No masses identified. No evidence of hydronephrosis. Stomach/Bowel: Visualized portions within the abdomen are unremarkable. Vascular/Lymphatic: No pathologically  enlarged lymph nodes identified. No abdominal aortic aneurysm demonstrated. Left portal vein appears to be partially effaced by liver masses. The central portal vein is patent. Other:  None. Musculoskeletal: No suspicious bone lesions identified. IMPRESSION: 1. There is a mass of the posterior pancreatic head measuring 4.3 x 3.8 cm with obstruction of the central pancreatic duct and diffuse dilatation up to 6 mm. This is consistent with pancreatic adenocarcinoma. 2. There are faintly contrast enhancing, intrinsically T1 and T2 hyperintense masses of varying sizes throughout the liver, largest mass of the central left lobe measuring 9.9 x 9.2 cm. These are consistent with hepatic metastatic disease. 3. Mild dilatation of the common bile duct, measuring up to 8 mm and tapering to the ampulla. This is likely related to prior cholecystectomy without evidence of overt common bile duct obstruction. 4. Trace bilateral pleural effusions. 5. Hepatomegaly. Electronically Signed   By: AEddie CandleM.D.   On: 02/21/2021 13:31   MR 3D Recon At Scanner  Result Date: 02/21/2021 CLINICAL DATA:  Pancreatic cancer, metastatic disease evaluation EXAM: MRI ABDOMEN WITHOUT AND WITH CONTRAST TECHNIQUE: Multiplanar multisequence MR imaging of the abdomen was performed both before and after the administration of intravenous contrast. CONTRAST:  166mGADAVIST GADOBUTROL 1 MMOL/ML IV SOLN COMPARISON:  CT abdomen pelvis, 02/16/2021  FINDINGS: Lower chest: Trace bilateral pleural effusions. Hepatobiliary: Hepatomegaly, maximum coronal span 22.4 cm. There are faintly contrast enhancing, intrinsically T1 and T2 hyperintense masses of varying sizes throughout the liver, largest mass of the central left lobe measuring 9.9 x 9.2 cm (series 5, image 12). These are generally isosignal, isoenhancing to pancreatic head mass. Status post cholecystectomy. Mild dilatation of the common bile duct, measuring up to 8 mm and tapering to the ampulla.  Pancreas: There is a mass of the posterior pancreatic head measuring 4.3 x 3.8 cm with obstruction of the central pancreatic duct and diffuse dilatation up to 6 mm. Spleen:  Within normal limits in size and appearance. Adrenals/Urinary Tract: No masses identified. No evidence of hydronephrosis. Stomach/Bowel: Visualized portions within the abdomen are unremarkable. Vascular/Lymphatic: No pathologically enlarged lymph nodes identified. No abdominal aortic aneurysm demonstrated. Left portal vein appears to be partially effaced by liver masses. The central portal vein is patent. Other:  None. Musculoskeletal: No suspicious bone lesions identified. IMPRESSION: 1. There is a mass of the posterior pancreatic head measuring 4.3 x 3.8 cm with obstruction of the central pancreatic duct and diffuse dilatation up to 6 mm. This is consistent with pancreatic adenocarcinoma. 2. There are faintly contrast enhancing, intrinsically T1 and T2 hyperintense masses of varying sizes throughout the liver, largest mass of the central left lobe measuring 9.9 x 9.2 cm. These are consistent with hepatic metastatic disease. 3. Mild dilatation of the common bile duct, measuring up to 8 mm and tapering to the ampulla. This is likely related to prior cholecystectomy without evidence of overt common bile duct obstruction. 4. Trace bilateral pleural effusions. 5. Hepatomegaly. Electronically Signed   By: Eddie Candle M.D.   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 Please perform ultrasound-guided liver mass biopsy for tissue diagnostic purposes. Additionally please perform image guided Port a catheter placement for durable intravenous access for the initiation of chemotherapy. EXAM: 1. ULTRASOUND-GUIDED LIVER LESION BIOPSY 2. IMPLANTED PORT A CATH PLACEMENT WITH ULTRASOUND AND FLUOROSCOPIC GUIDANCE COMPARISON:  CT  abdomen and pelvis - 02/16/2021; abdominal MRI - 02/21/2021 MEDICATIONS: None ANESTHESIA/SEDATION: Moderate (conscious) sedation was employed during this procedure. A total of Versed 4 mg and Fentanyl 150 mcg was administered intravenously. Moderate Sedation Time: 50 minutes. The patient's level of consciousness and vital signs were monitored continuously by radiology nursing throughout the procedure under my direct supervision. CONTRAST:  None FLUOROSCOPY TIME:  12 seconds (13 mGy) COMPLICATIONS: None immediate. PROCEDURE: The procedures, risks, benefits, and alternatives were explained to the patient. Questions regarding the procedure were encouraged and answered. The patient understands and consents to the procedure. Sonographic evaluation of the abdomen demonstrates an at least 3.5 x 3.4 cm hypoechoic mass within the dome of the left lobe the liver correlating with the dominant mass seen on preceding abdominal MRI image 31, series 26. The skin overlying the midline of the upper abdomen was prepped and draped in usual sterile fashion. After the overlying soft tissues were anesthetized with 1% lidocaine with epinephrine, a 17 gauge coaxial needle was advanced into the hypoechoic hepatic mass and five 18 gauge core needle biopsies were obtained under direct ultrasound guidance. The coaxial needle was removed following the administration of a Gel-Foam slurry. Postprocedural imaging was obtained. A dressing was applied. As the patient tolerated the procedure well, we then proceeded with placement of the port a catheter. _________________________________________________________  The right neck and chest were prepped with chlorhexidine in a sterile fashion, and a sterile drape was applied covering the operative field. Maximum barrier sterile technique with sterile gowns and gloves were used for the procedure. A timeout was performed prior to the initiation of the procedure. Local anesthesia was provided with 1%  lidocaine with epinephrine. After creating a small venotomy incision, a micropuncture kit was utilized to access the internal jugular vein. Real-time ultrasound guidance was utilized for vascular access including the acquisition of a permanent ultrasound image documenting patency of the accessed vessel. The microwire was utilized to measure appropriate catheter length. A subcutaneous port pocket was then created along the upper chest wall utilizing a combination of sharp and blunt dissection. The pocket was irrigated with sterile saline. A single lumen "standard sized" power injectable port was chosen for placement. The 8 Fr catheter was tunneled from the port pocket site to the venotomy incision. The port was placed in the pocket. The external catheter was trimmed to appropriate length. At the venotomy, an 8 Fr peel-away sheath was placed over a guidewire under fluoroscopic guidance. The catheter was then placed through the sheath and the sheath was removed. Final catheter positioning was confirmed and documented with a fluoroscopic spot radiograph. The port was accessed with a Huber needle and noted to easily aspirate and flush. The venotomy site was closed with an interrupted 4-0 Vicryl suture. The port pocket incision was closed with interrupted 2-0 Vicryl suture. The skin was opposed with a running subcuticular 4-0 Vicryl suture. Dermabond and Steri-strips were applied to both incisions. Dressings were applied. The Huber needle was then reinserted to allow for immediate in hospitalization usage. The patient tolerated the procedure well without immediate post procedural complication. FINDINGS: Under direct ultrasound guidance, five 18 gauge core needle biopsies were obtained the dominant hypoechoic mass involving the dome liver. After catheter placement, the tip lies within the superior cavoatrial junction. The catheter aspirates and flushes normally and is ready for immediate use. IMPRESSION: 1. Technically  successful ultrasound-guided biopsy of dominant hypoechoic mass within the dome of the left lobe of the liver. 2. Successful placement of a right internal jugular approach power injectable Port-A-Cath. The port a Catheter was left accessed for immediate usage. Electronically Signed   By: Sandi Mariscal M.D.   On: 02/25/2021 09:13   MR CARDIAC MORPHOLOGY W WO CONTRAST  Result Date: 02/21/2021 CLINICAL DATA:  RV filling defect on CT EXAM: CARDIAC MRI TECHNIQUE: The patient was scanned on a 1.5 Tesla Siemens magnet. A dedicated cardiac coil was used. Functional imaging was done using Fiesta sequences. 2,3, and 4 chamber views were done to assess for RWMA's. Modified Simpson's rule using a short axis stack was used to calculate an ejection fraction on a dedicated work Conservation officer, nature. The patient received 10 cc of Gadavist. After 10 minutes inversion recovery sequences were used to assess for infiltration and scar tissue. CONTRAST:  10 cc  of Gadavist FINDINGS: Left ventricle: -Normal size -Normal systolic function -Elevated ECV (36%) -No LGE LV EF:  53% (Normal 56-78%) Absolute volumes: LV EDV: 168m (Normal 52-141 mL) LV ESV: 749m(Normal 13-51 mL) LV SV: 9064mNormal 33-97 mL) CO: 7.4L/min (Normal 2.7-6.0 L/min) Indexed volumes: LV EDV: 2m63m-m (Normal 41-81 mL/sq-m) LV ESV: 34mL97mm (Normal 12-21 mL/sq-m) LV SV: 39mL/18m (Normal 26-56 mL/sq-m) CI: 3.2L/min/sq-m (Normal 1.8-3.8 L/min/sq-m) Right ventricle: -Normal size and systolic function -Mass in mid RV attached to interventricular septum measuring 16mm x97m. Ma25mis  heterogenous, with central area of low T1/high T2 and peripheral area of high T1. Dark on EGE but heterogenous area of enhancement on LGE RV EF: 59% (Normal 47-80%) Absolute volumes: RV EDV: 134m (Normal 58-154 mL) RV ESV: 679m(Normal 12-68 mL) RV SV: 9183mNormal 35-98 mL) CO: 7.5L/min (Normal 2.7-6 L/min) Indexed volumes: RV EDV: 64m47m-m (Normal 48-87 mL/sq-m) RV ESV:  28mL55mm (Normal 11-28 mL/sq-m) RV SV: 40mL/58m (Normal 27-57 mL/sq-m) CI: 3.2L/min/sq-m (Normal 1.8-3.8 L/min/sq-m) Left atrium: Mild enlargement Right atrium: Normal size Mitral valve: No regurgitation Aortic valve: No regurgitation Tricuspid valve: No regurgitation Pulmonic valve: No regurgitation Aorta: Normal proximal ascending aorta Pericardium: Normal IMPRESSION: 1. Mass in mid RV attached to interventricular septum measures 16mm x40m. Ma31mappears heterogenous, with area of enhancement on LGE imaging. Given patient with suspected malignancy, mass is concerning for metastatic tumor 2. Normal LV size and systolic function (EF 53%). No93%te gadolinium enhancement to suggest myocardial scar 3.  Normal RV size and systolic function (EF 59%) Ele71%onically Signed   By: ChristopOswaldo Milian: 02/21/2021 01:00   ECHOCARDIOGRAM COMPLETE  Result Date: 02/17/2021    ECHOCARDIOGRAM REPORT   Patient Name:   Lelaina LMARIAEDUARDA DEFRANCO Exam: 02/17/2021 Medical Rec #:  [id number]:       64.0 in Accession #:    [id number]:       258.1 lb Date of Birth:  3/21/19622-Mar-1967SA:          2.180 m Patient Age:    54 years86      BP:           117/77 mmHg Patient Gender: F               HR:           69 bpm. Exam Location:  Inpatient Procedure: 2D Echo, 3D Echo, Cardiac Doppler and Color Doppler Indications:    R94.31 Abnormal EKG  History:        Patient has prior history of Echocardiogram examinations, most                 recent 04/18/2008. COPD, Signs/Symptoms:Chest Pain; Risk                 Factors:Hypertension, Dyslipidemia and Current Smoker.  Sonographer:    Tina WesRoseanna Rainbowferring Phys: [id number] Hospitalapher Comments: Technically difficult study due to poor echo windows and patient is morbidly obese. Image acquisition challenging due to patient body habitus. IMPRESSIONS  1. Left ventricular ejection fraction, by estimation, is 55 to 60%. The left ventricle has normal function.  The left ventricle has no regional wall motion abnormalities. Left ventricular diastolic parameters were normal.  2. Right ventricular systolic function is normal. The right ventricular size is normal.  3. The mitral valve is normal in structure. No evidence of mitral valve regurgitation.  4. The aortic valve is normal in structure. Aortic valve regurgitation is not visualized. FINDINGS  Left Ventricle: Left ventricular ejection fraction, by estimation, is 55 to 60%. The left ventricle has normal function. The left ventricle has no regional wall motion abnormalities. The left ventricular internal cavity size was normal in size. There is  no left ventricular hypertrophy. Left ventricular diastolic parameters were normal. Right Ventricle: The right ventricular size is normal. No increase in right ventricular wall thickness. Right ventricular systolic function  is normal. Left Atrium: Left atrial size was normal in size. Right Atrium: Right atrial size was normal in size. Pericardium: There is no evidence of pericardial effusion. Mitral Valve: The mitral valve is normal in structure. No evidence of mitral valve regurgitation. Tricuspid Valve: The tricuspid valve is grossly normal. Tricuspid valve regurgitation is trivial. Aortic Valve: The aortic valve is normal in structure. Aortic valve regurgitation is not visualized. Pulmonic Valve: The pulmonic valve was grossly normal. Pulmonic valve regurgitation is not visualized. Aorta: The aortic root and ascending aorta are structurally normal, with no evidence of dilitation. IAS/Shunts: The atrial septum is grossly normal.  LEFT VENTRICLE PLAX 2D LVIDd:         4.70 cm      Diastology LVIDs:         3.00 cm      LV e' medial:    6.85 cm/s LV PW:         1.30 cm      LV E/e' medial:  13.7 LV IVS:        1.20 cm      LV e' lateral:   9.79 cm/s LVOT diam:     2.00 cm      LV E/e' lateral: 9.6 LV SV:         88 LV SV Index:   40 LVOT Area:     3.14 cm  LV Volumes (MOD) LV vol  d, MOD A2C: 97.7 ml LV vol d, MOD A4C: 146.0 ml LV vol s, MOD A2C: 42.0 ml LV vol s, MOD A4C: 46.4 ml LV SV MOD A2C:     55.7 ml LV SV MOD A4C:     146.0 ml LV SV MOD BP:      76.1 ml RIGHT VENTRICLE             IVC RV S prime:     12.80 cm/s  IVC diam: 1.50 cm TAPSE (M-mode): 2.1 cm LEFT ATRIUM             Index       RIGHT ATRIUM           Index LA diam:        3.30 cm 1.51 cm/m  RA Area:     12.00 cm LA Vol (A2C):   46.6 ml 21.37 ml/m RA Volume:   21.60 ml  9.91 ml/m LA Vol (A4C):   29.6 ml 13.58 ml/m LA Biplane Vol: 37.7 ml 17.29 ml/m  AORTIC VALVE LVOT Vmax:   131.00 cm/s LVOT Vmean:  91.900 cm/s LVOT VTI:    0.279 m  AORTA Ao Root diam: 3.70 cm MITRAL VALVE MV Area (PHT): 4.06 cm    SHUNTS MV Decel Time: 187 msec    Systemic VTI:  0.28 m MV E velocity: 93.60 cm/s  Systemic Diam: 2.00 cm MV A velocity: 84.40 cm/s MV E/A ratio:  1.11 Mertie Moores MD Electronically signed by Mertie Moores MD Signature Date/Time: 02/17/2021/3:41:34 PM    Final    IR IMAGING GUIDED PORT INSERTION  Result Date: 02/25/2021 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 Please perform ultrasound-guided liver mass biopsy for tissue diagnostic purposes. Additionally please perform image guided Port a catheter placement for durable intravenous access for the initiation of chemotherapy. EXAM: 1. ULTRASOUND-GUIDED LIVER LESION BIOPSY 2. IMPLANTED PORT A CATH PLACEMENT WITH ULTRASOUND AND FLUOROSCOPIC GUIDANCE COMPARISON:  CT abdomen and pelvis - 02/16/2021; abdominal MRI - 02/21/2021 MEDICATIONS: None  ANESTHESIA/SEDATION: Moderate (conscious) sedation was employed during this procedure. A total of Versed 4 mg and Fentanyl 150 mcg was administered intravenously. Moderate Sedation Time: 50 minutes. The patient's level of consciousness and vital signs were monitored continuously by radiology nursing throughout the procedure under my direct supervision. CONTRAST:  None FLUOROSCOPY  TIME:  12 seconds (13 mGy) COMPLICATIONS: None immediate. PROCEDURE: The procedures, risks, benefits, and alternatives were explained to the patient. Questions regarding the procedure were encouraged and answered. The patient understands and consents to the procedure. Sonographic evaluation of the abdomen demonstrates an at least 3.5 x 3.4 cm hypoechoic mass within the dome of the left lobe the liver correlating with the dominant mass seen on preceding abdominal MRI image 31, series 26. The skin overlying the midline of the upper abdomen was prepped and draped in usual sterile fashion. After the overlying soft tissues were anesthetized with 1% lidocaine with epinephrine, a 17 gauge coaxial needle was advanced into the hypoechoic hepatic mass and five 18 gauge core needle biopsies were obtained under direct ultrasound guidance. The coaxial needle was removed following the administration of a Gel-Foam slurry. Postprocedural imaging was obtained. A dressing was applied. As the patient tolerated the procedure well, we then proceeded with placement of the port a catheter. _________________________________________________________ The right neck and chest were prepped with chlorhexidine in a sterile fashion, and a sterile drape was applied covering the operative field. Maximum barrier sterile technique with sterile gowns and gloves were used for the procedure. A timeout was performed prior to the initiation of the procedure. Local anesthesia was provided with 1% lidocaine with epinephrine. After creating a small venotomy incision, a micropuncture kit was utilized to access the internal jugular vein. Real-time ultrasound guidance was utilized for vascular access including the acquisition of a permanent ultrasound image documenting patency of the accessed vessel. The microwire was utilized to measure appropriate catheter length. A subcutaneous port pocket was then created along the upper chest wall utilizing a combination of  sharp and blunt dissection. The pocket was irrigated with sterile saline. A single lumen "standard sized" power injectable port was chosen for placement. The 8 Fr catheter was tunneled from the port pocket site to the venotomy incision. The port was placed in the pocket. The external catheter was trimmed to appropriate length. At the venotomy, an 8 Fr peel-away sheath was placed over a guidewire under fluoroscopic guidance. The catheter was then placed through the sheath and the sheath was removed. Final catheter positioning was confirmed and documented with a fluoroscopic spot radiograph. The port was accessed with a Huber needle and noted to easily aspirate and flush. The venotomy site was closed with an interrupted 4-0 Vicryl suture. The port pocket incision was closed with interrupted 2-0 Vicryl suture. The skin was opposed with a running subcuticular 4-0 Vicryl suture. Dermabond and Steri-strips were applied to both incisions. Dressings were applied. The Huber needle was then reinserted to allow for immediate in hospitalization usage. The patient tolerated the procedure well without immediate post procedural complication. FINDINGS: Under direct ultrasound guidance, five 18 gauge core needle biopsies were obtained the dominant hypoechoic mass involving the dome liver. After catheter placement, the tip lies within the superior cavoatrial junction. The catheter aspirates and flushes normally and is ready for immediate use. IMPRESSION: 1. Technically successful ultrasound-guided biopsy of dominant hypoechoic mass within the dome of the left lobe of the liver. 2. Successful placement of a right internal jugular approach power injectable Port-A-Cath. The port a Catheter was  left accessed for immediate usage. Electronically Signed   By: Sandi Mariscal M.D.   On: 02/25/2021 09:13    Microbiology: No results found for this or any previous visit (from the past 240 hour(s)).   Labs: Basic Metabolic Panel: Recent Labs   Lab 02/24/21 0412 02/25/21 0506 02/26/21 0421 02/27/21 0916 02/28/21 0352  NA 138 140 142 137 139  K 3.7 3.7 3.8 4.0 3.8  CL 100 103 104 99 101  CO2 '26 27 31 29 28  ' GLUCOSE 90 105* 98 105* 86  BUN '7 8 9 9 8  ' CREATININE 0.72 0.66 0.56 0.56 0.67  CALCIUM 9.3 9.3 9.3 9.2 9.1  MG  --   --   --  2.0 2.1  PHOS  --   --   --  4.4 4.3   Liver Function Tests: Recent Labs  Lab 02/27/21 0916 02/28/21 0352  AST 44* 31  ALT 30 26  ALKPHOS 80 72  BILITOT 0.7 0.8  PROT 6.7 6.1*  ALBUMIN 3.2* 2.9*   No results for input(s): LIPASE, AMYLASE in the last 168 hours. No results for input(s): AMMONIA in the last 168 hours. CBC: Recent Labs  Lab 02/24/21 0412 02/25/21 0506 02/26/21 0421 02/26/21 0624 02/27/21 0916 02/28/21 0352  WBC 10.8* 9.8 9.4  --  8.8 7.7  NEUTROABS 8.0* 7.2 7.1  --  6.7 5.9  HGB 8.5* 7.5* 6.9* 7.1* 8.4* 8.0*  HCT 29.3* 26.2* 23.4* 24.2* 28.4* 27.1*  MCV 84.2 83.7 83.6  --  83.8 85.0  PLT 342 303 259  --  295 299   Cardiac Enzymes: No results for input(s): CKTOTAL, CKMB, CKMBINDEX, TROPONINI in the last 168 hours. BNP: BNP (last 3 results) No results for input(s): BNP in the last 8760 hours.  ProBNP (last 3 results) No results for input(s): PROBNP in the last 8760 hours.  CBG: No results for input(s): GLUCAP in the last 168 hours.     Signed:  Dia Crawford, MD [facility]

## 2021-03-02 ENCOUNTER — Ambulatory Visit
Admission: RE | Admit: 2021-03-02 | Discharge: 2021-03-02 | Disposition: A | Discharge status: Home / Self Care | Payer: Medicaid Other | Source: Ambulatory Visit | Attending: Radiation Oncology | Admitting: Radiation Oncology

## 2021-03-02 ENCOUNTER — Other Ambulatory Visit: Payer: Self-pay

## 2021-03-02 ENCOUNTER — Telehealth: Payer: Self-pay

## 2021-03-02 DIAGNOSIS — J45909 Unspecified asthma, uncomplicated: Secondary | ICD-10-CM | POA: Diagnosis not present

## 2021-03-02 DIAGNOSIS — D509 Iron deficiency anemia, unspecified: Secondary | ICD-10-CM | POA: Diagnosis not present

## 2021-03-02 DIAGNOSIS — Z510 Encounter for antineoplastic radiation therapy: Secondary | ICD-10-CM | POA: Diagnosis not present

## 2021-03-02 DIAGNOSIS — E039 Hypothyroidism, unspecified: Secondary | ICD-10-CM | POA: Diagnosis not present

## 2021-03-02 DIAGNOSIS — G43909 Migraine, unspecified, not intractable, without status migrainosus: Secondary | ICD-10-CM | POA: Diagnosis not present

## 2021-03-02 DIAGNOSIS — I10 Essential (primary) hypertension: Secondary | ICD-10-CM | POA: Diagnosis not present

## 2021-03-02 DIAGNOSIS — C250 Malignant neoplasm of head of pancreas: Secondary | ICD-10-CM | POA: Diagnosis not present

## 2021-03-02 DIAGNOSIS — C787 Secondary malignant neoplasm of liver and intrahepatic bile duct: Secondary | ICD-10-CM | POA: Diagnosis not present

## 2021-03-02 NOTE — Telephone Encounter (Signed)
Transition Care Management Unsuccessful Follow-up Telephone Call  Date of discharge and from where:  02/28/2021 from [facility]  Attempts:  1st Attempt  Reason for unsuccessful TCM follow-up call:  Left voice message

## 2021-03-03 ENCOUNTER — Encounter: Payer: Self-pay | Admitting: Oncology

## 2021-03-03 ENCOUNTER — Other Ambulatory Visit: Payer: Self-pay

## 2021-03-03 ENCOUNTER — Ambulatory Visit
Admission: RE | Admit: 2021-03-03 | Discharge: 2021-03-03 | Disposition: A | Discharge status: Home / Self Care | Payer: Medicaid Other | Source: Ambulatory Visit | Attending: Radiation Oncology | Admitting: Radiation Oncology

## 2021-03-03 DIAGNOSIS — Z510 Encounter for antineoplastic radiation therapy: Secondary | ICD-10-CM | POA: Diagnosis not present

## 2021-03-03 NOTE — Progress Notes (Signed)
Patient stopped by and states she applied for Medicaid and disability and wanted to know status. Advised I would review notes.  Reviewed notes from hospital.  Advised patient Medicaid application was faxed and the hospital counselor will follow up with caseworker once assigned. Also, advised disability would be followed as well but it is a process and may take quite sometime. She verbalized understanding.

## 2021-03-03 NOTE — Telephone Encounter (Signed)
Transition Care Management Follow-up Telephone Call  Date of discharge and from where: 02/28/2021 - [facility]  How have you been since you were released from the hospital? "Still in some pain"  Any questions or concerns? No  Items Reviewed:  Did the pt receive and understand the discharge instructions provided? Yes   Medications obtained and verified? Yes   Other? No   Any new allergies since your discharge? No   Dietary orders reviewed? No  Do you have support at home? Yes   Home Care and Equipment/Supplies: Were home health services ordered? not applicable If so, what is the name of the agency? N/A  Has the agency set up a time to come to the patient's home? not applicable Were any new equipment or medical supplies ordered?  No What is the name of the medical supply agency? N/A Were you able to get the supplies/equipment? not applicable Do you have any questions related to the use of the equipment or supplies? No  Functional Questionnaire: (I = Independent and D = Dependent) ADLs: I  Bathing/Dressing- I  Meal Prep- I  Eating- I  Maintaining continence- I  Transferring/Ambulation- I  Managing Meds- I  Follow up appointments reviewed:   PCP Hospital f/u appt confirmed? No  - pt does not have a PCP at this time  [facility] f/u appt confirmed? Yes  - pt has several upcoming appointments with Oncology  Are transportation arrangements needed? No   If their condition worsens, is the pt aware to call PCP or go to the Emergency Dept.? Yes  Was the patient provided with contact information for the PCP's office or ED? Yes  Was to pt encouraged to call back with questions or concerns? Yes

## 2021-03-04 ENCOUNTER — Ambulatory Visit
Admission: RE | Admit: 2021-03-04 | Discharge: 2021-03-04 | Disposition: A | Discharge status: Home / Self Care | Payer: Medicaid Other | Source: Ambulatory Visit | Attending: Radiation Oncology | Admitting: Radiation Oncology

## 2021-03-04 ENCOUNTER — Inpatient Hospital Stay: Payer: Medicaid Other | Admitting: Dietician

## 2021-03-04 DIAGNOSIS — J45909 Unspecified asthma, uncomplicated: Secondary | ICD-10-CM | POA: Insufficient documentation

## 2021-03-04 DIAGNOSIS — Z8673 Personal history of transient ischemic attack (TIA), and cerebral infarction without residual deficits: Secondary | ICD-10-CM | POA: Insufficient documentation

## 2021-03-04 DIAGNOSIS — C787 Secondary malignant neoplasm of liver and intrahepatic bile duct: Secondary | ICD-10-CM | POA: Insufficient documentation

## 2021-03-04 DIAGNOSIS — C250 Malignant neoplasm of head of pancreas: Secondary | ICD-10-CM | POA: Insufficient documentation

## 2021-03-04 DIAGNOSIS — I10 Essential (primary) hypertension: Secondary | ICD-10-CM | POA: Insufficient documentation

## 2021-03-04 DIAGNOSIS — E039 Hypothyroidism, unspecified: Secondary | ICD-10-CM | POA: Insufficient documentation

## 2021-03-04 DIAGNOSIS — D509 Iron deficiency anemia, unspecified: Secondary | ICD-10-CM | POA: Insufficient documentation

## 2021-03-04 DIAGNOSIS — G43909 Migraine, unspecified, not intractable, without status migrainosus: Secondary | ICD-10-CM | POA: Insufficient documentation

## 2021-03-04 DIAGNOSIS — Z510 Encounter for antineoplastic radiation therapy: Secondary | ICD-10-CM | POA: Diagnosis not present

## 2021-03-04 NOTE — Progress Notes (Signed)
Nutrition Assessment  ASSESSMENT: 55 year old female with newly diagnosed pancreatic cancer receiving adjuvant radiation followed by chemotherapy followed by Dr. Lisbeth Renshaw and Dr. Benay Spice.  Past medical history of HTN, Migraines, Aortic atherosclerosis, Asthma, COPD, GIB, Hypothyroidism, Iron deficiency anemia.  Met with patient in clinic after radiation this afternoon. She reports tolerating treatment, denies nausea, vomiting, diarrhea, regular bowel movements every 2 days takes Miralax daily. Patient reports ongoing poor appetite. She is drinking 3 Ensure Complete daily and eating 1-2 small snacks/day. She usually has 1 ginger ale and 3-4 bottles of water daily. Yesterday, she ate 1 egg, applesauce, drank 3 Ensure completes, and a ginger ale. Patient reports usual weight around 298 lbs. She reports history of obesity related to severe asthma requiring chronic steroid use.   Medications: Flexeril, Diflucan, MS Contin, Prilosec, Zofran, Percocet, Protonix, Miralax, Prednisone, Carafate, Imitrex, Ambien   Labs: 3/18 Glucose 105, Albumin 3.2, AST 44  Anthropometrics:   Height: 5'4" Weight: 270 lb 1.6 oz (122.5 kg on 3/14) UBW: 298 lbs (per pt) BMI: 46.76   NUTRITION DIAGNOSIS: Inadequate oral intake related to poor appetite secondary to pancreatic cancer, as evidenced by patient report and 9.4% decrease from usual weight   INTERVENTION:  Educated on increased nutrition needs and the importance of protein Discussed strategies for increasing calories and protein Encouraged small frequent meals high in calories and protein Continue drinking 3-4 Ensure supplements daily Recipes for Shakes/Smoothies provided Complimentary case of Ensure provided today Fact Sheets given RD contact information provided   MONITORING, EVALUATION, GOAL: weight trends, oral intake   Next Visit: to be scheduled with treatment plan

## 2021-03-05 ENCOUNTER — Inpatient Hospital Stay: Payer: Medicaid Other

## 2021-03-05 ENCOUNTER — Other Ambulatory Visit: Payer: Self-pay | Admitting: Genetic Counselor

## 2021-03-05 ENCOUNTER — Encounter: Payer: Self-pay | Admitting: General Practice

## 2021-03-05 ENCOUNTER — Other Ambulatory Visit: Payer: Self-pay | Admitting: Radiation Oncology

## 2021-03-05 ENCOUNTER — Inpatient Hospital Stay (HOSPITAL_BASED_OUTPATIENT_CLINIC_OR_DEPARTMENT_OTHER): Payer: Medicaid Other | Admitting: Genetic Counselor

## 2021-03-05 ENCOUNTER — Ambulatory Visit
Admission: RE | Admit: 2021-03-05 | Discharge: 2021-03-05 | Disposition: A | Discharge status: Home / Self Care | Payer: Medicaid Other | Source: Ambulatory Visit | Attending: Radiation Oncology | Admitting: Radiation Oncology

## 2021-03-05 ENCOUNTER — Other Ambulatory Visit: Payer: Self-pay

## 2021-03-05 ENCOUNTER — Encounter: Payer: Self-pay | Admitting: Radiation Oncology

## 2021-03-05 DIAGNOSIS — Z95828 Presence of other vascular implants and grafts: Secondary | ICD-10-CM

## 2021-03-05 DIAGNOSIS — C250 Malignant neoplasm of head of pancreas: Secondary | ICD-10-CM

## 2021-03-05 DIAGNOSIS — Z510 Encounter for antineoplastic radiation therapy: Secondary | ICD-10-CM | POA: Diagnosis not present

## 2021-03-05 DIAGNOSIS — Z800 Family history of malignant neoplasm of digestive organs: Secondary | ICD-10-CM

## 2021-03-05 DIAGNOSIS — Z807 Family history of other malignant neoplasms of lymphoid, hematopoietic and related tissues: Secondary | ICD-10-CM

## 2021-03-05 LAB — GENETIC SCREENING ORDER

## 2021-03-05 MED ORDER — OXYCODONE-ACETAMINOPHEN 7.5-325 MG PO TABS: 1.0000 | ORAL_TABLET | ORAL | 0 refills | Status: DC | PRN

## 2021-03-05 MED ORDER — HEPARIN SOD (PORK) LOCK FLUSH 100 UNIT/ML IV SOLN
500.0000 [IU] | Freq: Once | INTRAVENOUS | Status: AC
  Administered 2021-03-05: 500 [IU] via INTRAVENOUS
  Filled 2021-03-05: qty 5

## 2021-03-05 MED ORDER — MORPHINE SULFATE ER 30 MG PO TBCR: 30.0000 mg | EXTENDED_RELEASE_TABLET | Freq: Two times a day (BID) | ORAL | 0 refills | Status: DC

## 2021-03-05 MED ORDER — SODIUM CHLORIDE 0.9% FLUSH
10.0000 mL | INTRAVENOUS | Status: DC | PRN
  Administered 2021-03-05: 10 mL via INTRAVENOUS
  Filled 2021-03-05: qty 10

## 2021-03-05 NOTE — Patient Instructions (Signed)
Implanted Port Insertion, Care After This sheet gives you information about how to care for yourself after your procedure. Your health care provider may also give you more specific instructions. If you have problems or questions, contact your health care provider. What can I expect after the procedure? After the procedure, it is common to have:  Discomfort at the port insertion site.  Bruising on the skin over the port. This should improve over 3-4 days. Follow these instructions at home: Port care  After your port is placed, you will get a manufacturer's information card. The card has information about your port. Keep this card with you at all times.  Take care of the port as told by your health care provider. Ask your health care provider if you or a family member can get training for taking care of the port at home. A home health care nurse may also take care of the port.  Make sure to remember what type of port you have. Incision care  Follow instructions from your health care provider about how to take care of your port insertion site. Make sure you: ? Wash your hands with soap and water before and after you change your bandage (dressing). If soap and water are not available, use hand sanitizer. ? Change your dressing as told by your health care provider. ? Leave stitches (sutures), skin glue, or adhesive strips in place. These skin closures may need to stay in place for 2 weeks or longer. If adhesive strip edges start to loosen and curl up, you may trim the loose edges. Do not remove adhesive strips completely unless your health care provider tells you to do that.  Check your port insertion site every day for signs of infection. Check for: ? Redness, swelling, or pain. ? Fluid or blood. ? Warmth. ? Pus or a bad smell.      Activity  Return to your normal activities as told by your health care provider. Ask your health care provider what activities are safe for you.  Do not  lift anything that is heavier than 10 lb (4.5 kg), or the limit that you are told, until your health care provider says that it is safe. General instructions  Take over-the-counter and prescription medicines only as told by your health care provider.  Do not take baths, swim, or use a hot tub until your health care provider approves. Ask your health care provider if you may take showers. You may only be allowed to take sponge baths.  Do not drive for 24 hours if you were given a sedative during your procedure.  Wear a medical alert bracelet in case of an emergency. This will tell any health care providers that you have a port.  Keep all follow-up visits as told by your health care provider. This is important. Contact a health care provider if:  You cannot flush your port with saline as directed, or you cannot draw blood from the port.  You have a fever or chills.  You have redness, swelling, or pain around your port insertion site.  You have fluid or blood coming from your port insertion site.  Your port insertion site feels warm to the touch.  You have pus or a bad smell coming from the port insertion site. Get help right away if:  You have chest pain or shortness of breath.  You have bleeding from your port that you cannot control. Summary  Take care of the port as told by your   health care provider. Keep the manufacturer's information card with you at all times.  Change your dressing as told by your health care provider.  Contact a health care provider if you have a fever or chills or if you have redness, swelling, or pain around your port insertion site.  Keep all follow-up visits as told by your health care provider. This information is not intended to replace advice given to you by your health care provider. Make sure you discuss any questions you have with your health care provider. Document Revised: 06/27/2018 Document Reviewed: 06/27/2018 Elsevier Patient Education   2021 Elsevier Inc.  

## 2021-03-05 NOTE — Progress Notes (Signed)
Wendy Hoffman CSW Progress Notes  Pt requests information on who was working with her on disability and MEdicaid applications on the inpatient side.  From review, appears she is working w Wendy Hoffman [phone] from [employer].  Therapist, nutritional and provided patient w the contact information.  Wendy Shell, LCSW Clinical Social Worker Phone:  [phone]

## 2021-03-05 NOTE — Progress Notes (Signed)
I saw the patient today and she is completed 7 of the 8 fractions to the pancreas for radiotherapy.  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  In further discussing her symptoms, the patient has had some upper abdominal/lower chest discomfort, this could certainly be the presence of her tumor but with her history of her involvement of the heart, a more thorough assessment was made with normal vital signs collected, the patient in general appeared to be well without acute cardiopulmonary signs or findings during her gross assessment.  She was encouraged to proceed to the emergency department however if she developed progressive symptoms but after further discussion it sounded like she was out of her pain medicine and needed a new prescription sent in.  I did send in a new prescription for Percocet and Dr. Benay Spice will plan to refill subsequent narcotic medications.  She is in agreement with this plan hopefully however her pain from her pancreas tumor will improve following her treatment with radiotherapy.  We will reach out in approximately Monday 1 months time after completing radiation to check on her as well.     Carola Rhine, PAC

## 2021-03-06 ENCOUNTER — Other Ambulatory Visit: Payer: Self-pay

## 2021-03-06 ENCOUNTER — Inpatient Hospital Stay (HOSPITAL_BASED_OUTPATIENT_CLINIC_OR_DEPARTMENT_OTHER): Payer: Medicaid Other | Admitting: Oncology

## 2021-03-06 ENCOUNTER — Inpatient Hospital Stay: Payer: Medicaid Other

## 2021-03-06 ENCOUNTER — Encounter: Payer: Self-pay | Admitting: Genetic Counselor

## 2021-03-06 ENCOUNTER — Encounter: Payer: Self-pay | Admitting: Radiation Oncology

## 2021-03-06 ENCOUNTER — Ambulatory Visit
Admission: RE | Admit: 2021-03-06 | Discharge: 2021-03-06 | Disposition: A | Discharge status: Home / Self Care | Payer: Medicaid Other | Source: Ambulatory Visit | Attending: Radiation Oncology | Admitting: Radiation Oncology

## 2021-03-06 ENCOUNTER — Telehealth: Payer: Self-pay | Admitting: Oncology

## 2021-03-06 VITALS — BP 127/83 | HR 99 | Temp 99.2°F | Resp 18 | Ht 64.0 in | Wt 256.2 lb

## 2021-03-06 DIAGNOSIS — Z7189 Other specified counseling: Secondary | ICD-10-CM | POA: Insufficient documentation

## 2021-03-06 DIAGNOSIS — Z95828 Presence of other vascular implants and grafts: Secondary | ICD-10-CM

## 2021-03-06 DIAGNOSIS — C250 Malignant neoplasm of head of pancreas: Secondary | ICD-10-CM

## 2021-03-06 DIAGNOSIS — Z510 Encounter for antineoplastic radiation therapy: Secondary | ICD-10-CM | POA: Diagnosis not present

## 2021-03-06 MED ORDER — LIDOCAINE-PRILOCAINE 2.5-2.5 % EX CREA: 1.0000 "application " | TOPICAL_CREAM | CUTANEOUS | 0 refills | Status: DC | PRN

## 2021-03-06 MED ORDER — PROCHLORPERAZINE MALEATE 10 MG PO TABS: 10.0000 mg | ORAL_TABLET | Freq: Four times a day (QID) | ORAL | 0 refills | Status: DC | PRN

## 2021-03-06 MED ORDER — MORPHINE SULFATE ER 30 MG PO TBCR
30.0000 mg | EXTENDED_RELEASE_TABLET | Freq: Two times a day (BID) | ORAL | 0 refills | Status: DC
Start: 2021-03-06 — End: 2021-04-14

## 2021-03-06 NOTE — Progress Notes (Signed)
Met with patient at hospital follow up with Dr. Julieanne Manson.  I explained my role as nurse navigator and she was given my direct contact information.  She was given printed information on Folfox regimen as outlined by Dr. Benay Spice today.  She understands we are sending in EMLA cream and compazine to the [facility] on file.  She also understands she will be receiving calls from scheduling regarding the chemo education class and her first treatment which will be at the new [place] location.  She was given the address and phone number.

## 2021-03-06 NOTE — Progress Notes (Signed)
REFERRING PROVIDER: Ladell Pier, MD [place],  [place] [address]  PRIMARY PROVIDER:  No PCP listed  PRIMARY REASON FOR VISIT:  1. Malignant neoplasm of head of pancreas ([facility] [place])   2. Family history of pancreatic cancer   3. Family history of non-Hodgkin's lymphoma     HISTORY OF PRESENT ILLNESS:   Wendy Hoffman, a 55 y.o. female, was seen for a [facility] cancer genetics consultation at the request of Dr. Benay Spice due to a personal history of cancer.  Wendy Hoffman presents to clinic today to discuss the possibility of a hereditary predisposition to cancer, to discuss genetic testing, and to further clarify her future cancer risks, as well as potential cancer risks for family members.   In March 2022, at the age of 13, Wendy Hoffman was diagnosed with pancreatic cancer.  She denies a personal history of diabetes or pancreatitis.  She reported a personal history of thyroid cancer more than 30 years ago, for which she had a thyroidectomy.  CANCER HISTORY:  Oncology History  Malignant neoplasm of head of pancreas ([place])  02/17/2021 Initial Diagnosis   Malignant neoplasm of head of pancreas ([place])   02/25/2021 Cancer Staging   Staging form: Exocrine Pancreas, AJCC 8th Edition - Clinical: Stage IV (cT3, cN0, cM1) - Signed by Ladell Pier, MD on 02/25/2021 Total positive nodes: 0    RISK FACTORS:  Menarche was at age 40.  First live birth at age 5.  OCP use for approximately 1 month. Ovaries intact: yes.  Hysterectomy: yes approximately 15 years ago HRT use: 0 years. Colonoscopy: yes; most recent 02/2021. Mammogram within the last year: no. Number of breast biopsies: 1; more than 30 years ago  Past Medical History:  Diagnosis Date  . Anemia   . Anxiety   . Arthritis   . Asthma   . Cancer ([place])    cervic  . H/O: hysterectomy   . High cholesterol   . Hypertension   . Hypothyroidism   . Insomnia   . Medical history non-contributory   . Migraine   .  Morbid obesity ([place])   . Shortness of breath   . Stroke ([place])   . Thyroid disease     Past Surgical History:  Procedure Laterality Date  . ABDOMINAL HYSTERECTOMY    . BIOPSY  02/19/2021   Procedure: BIOPSY;  Surgeon: Mauri Pole, MD;  Location: [facility];  Service: Endoscopy;;  . CESAREAN SECTION     3 occasions  . CHOLECYSTECTOMY    . ESOPHAGOGASTRODUODENOSCOPY (EGD) WITH PROPOFOL N/A 02/19/2021   Procedure: ESOPHAGOGASTRODUODENOSCOPY (EGD) WITH PROPOFOL;  Surgeon: Mauri Pole, MD;  Location: [facility];  Service: Endoscopy;  Laterality: N/A;  . FLEXIBLE SIGMOIDOSCOPY N/A 02/19/2021   Procedure: FLEXIBLE SIGMOIDOSCOPY;  Surgeon: Mauri Pole, MD;  Location: [facility];  Service: Endoscopy;  Laterality: N/A;  . IR IMAGING GUIDED PORT INSERTION  02/24/2021  . IR US GUIDE BX ASP/DRAIN  02/24/2021  . THYROIDECTOMY, PARTIAL     Goiter    Social History   Socioeconomic History  . Marital status: Single    Spouse name: Not on file  . Number of children: Not on file  . Years of education: Not on file  . Highest education level: Not on file  Occupational History  . Not on file  Tobacco Use  . Smoking status: Former Smoker    Types: Cigarettes  . Smokeless tobacco: Never Used  Vaping Use  . Vaping Use:  Never used  Substance and Sexual Activity  . Alcohol use: No  . Drug use: No  . Sexual activity: Not on file  Other Topics Concern  . Not on file  Social History Narrative  . Not on file   Social Determinants of Health   Financial Resource Strain: Not on file  Food Insecurity: Not on file  Transportation Needs: Not on file  Physical Activity: Not on file  Stress: Not on file  Social Connections: Not on file     FAMILY HISTORY:  We obtained a detailed, 4-generation family history.  Significant diagnoses are listed below: Family History  Problem Relation Age of Onset  . Migraines Sister   . Non-Hodgkin's lymphoma Other        maternal half  brother's grandson  . Non-Hodgkin's lymphoma Half-Brother        maternal half brother; dx 93s or 75s  . Pancreatic cancer Other 95       MGF's niece    Wendy Hoffman has two sons and one daughter, all in their late 74s or early 80s.  Wendy Hoffman has two maternal half sisters and three maternal half brothers.  Her sister had NHL diagnosed in her 74s or 24s.  Wendy Hoffman also reported a distant relative, her maternal grandfather's niece, with pancreatic cancer.  This relative reported a TP53 mutation (no report to confirm somatic or germline in origin). [name] had limited information about her paternal family.   [name] is unaware of previous family history of genetic testing for hereditary cancer risks. Patient's maternal ancestors are of [place] descent, and paternal ancestors are of African American descent. There is no reported Ashkenazi Jewish ancestry. There is no known consanguinity.  GENETIC COUNSELING ASSESSMENT: Wendy Hoffman is a 55 y.o. female with a personal history of cancer which is somewhat suggestive of a hereditary cancer syndrome and predisposition to cancer given her diagnosis of pancreatic cancer. We, therefore, discussed and recommended the following at today's visit.   DISCUSSION: We discussed that 5 - 10% of cancer is hereditary, with most cases of hereditary pancreatic cancer associated with mutations in BRCA1/2.  There are other genes that can be associated with hereditary pancreatic cancer syndromes.  Type of cancer risk and level of risk are gene-specific.  We discussed that testing is beneficial for several reasons including knowing how to follow individuals for their cancer risks, identifying whether potential treatment options such as PARP inhibitors would be beneficial, and understanding if other family members could be at risk for cancer and allowing them to undergo genetic testing.   We reviewed the characteristics, features and inheritance patterns of  hereditary cancer syndromes. We also discussed genetic testing, including the appropriate family members to test, the process of testing, cost of testing, and turn-around-time for results. We discussed the implications of a negative, positive, carrier and/or variant of uncertain significant result. We recommended Wendy Hoffman pursue genetic testing for a panel that includes genes associated with pancreatic and thyroid cancer.   The Multi-Cancer Panel with pancreatitis genes and preliminary pancreatic cancer genes offered by [facility] includes sequencing and/or deletion duplication testing of the following 91 genes: AIP, ALK, APC, ATM, AXIN2,BAP1,  BARD1, BLM, BMPR1A, BRCA1, BRCA2, BRIP1, CASR, CDC73, CDH1, CDK4, CDKN1B, CDKN1C, CDKN2A (p14ARF), CDKN2A (p16INK4a), CEBPA, CFTR, CHEK2, CPA1, CTNNA1, CTRC, DICER1, DIS3L2, EGFR (c.2369C>T, p.Thr790Met variant only), EPCAM (Deletion/duplication testing only), FANCC, FH, FLCN, GATA2, GPC3, GREM1 (Promoter region deletion/duplication testing only), HOXB13 (c.251G>A, p.Gly84Glu), HRAS, KIT, MAX, MEN1, MET,  MITF (c.952G>A, p.Glu318Lys variant only), MLH1, MSH2, MSH3, MSH6, MUTYH, NBN, NF1, NF2, NTHL1, PALB2, PALLD, PDGFRA, PHOX2B, PMS2, POLD1, POLE, POT1, PRKAR1A, PRSS1, PTCH1, PTEN, RAD50, RAD51C, RAD51D, RB1, RECQL4, RET, RNF43, RUNX1, SDHAF2, SDHA (sequence changes only), SDHB, SDHC, SDHD, SMAD4, SMARCA4, SMARCB1, SMARCE1, SPINK1, STK11, SUFU, TERC, TERT, TMEM127, TP53, TSC1, TSC2, VHL, WRN and WT1.   Based on Wendy Hoffman's personal history of pancreatic cancer, she meets medical criteria for genetic testing. Despite that she meets criteria, she may still have an out of pocket cost.  We discussed the Patient Assistance Program (PAP) through [employer], which waives the cost of testing for those who meet income requirements.  Ms. Masse submitted an application.  She is aware that the laboratory may reach out to her to confirm income.    PLAN: After considering the  risks, benefits, and limitations, Wendy Hoffman provided informed consent to pursue genetic testing and the blood sample was sent to [facility] for analysis of the Multi-Cancer Panel. Results should be available within approximately 3 weeks' time, at which point they will be disclosed by telephone to Wendy Hoffman, as will any additional recommendations warranted by these results. Wendy Hoffman will receive a summary of her genetic counseling visit and a copy of her results once available. This information will also be available in Epic.   Lastly, we encouraged Wendy Hoffman to remain in contact with cancer genetics annually so that we can continuously update the family history and inform her of any changes in cancer genetics and testing that may be of benefit for this family.   Wendy Hoffman questions were answered to her satisfaction today. Our contact information was provided should additional questions or concerns arise. Thank you for the referral and allowing [place] to share in the care of your patient.   Willowdean Luhmann M. Joette Catching, [place], [facility] Genetic Counselor [email or web address] (P) [phone]  The patient was seen for a total of 30 minutes in face-to-face genetic counseling.  Drs. Magrinat, Lindi Adie and/or Burr Medico were available to discuss this case as needed.    _______________________________________________________________________ For Office Staff:  Number of people involved in session: 1 Was an Intern/ student involved with case: no

## 2021-03-06 NOTE — Telephone Encounter (Signed)
Scheduled appt per 3/25 los - called pt per los - unable to reach pt .left message for patient with new appt date and time

## 2021-03-06 NOTE — Progress Notes (Signed)
[facility] OFFICE PROGRESS NOTE   Diagnosis: Pancreas cancer  INTERVAL HISTORY:   Ms. Ficek was discharged in the hospital on 02/28/2021.  She will complete the course of palliative radiation to the duodenal mass today.  She continues to have abdominal pain.  She is maintained on MS Contin and takes oxycodone as needed for breakthrough pain.  She has persistent nausea.  She is tolerating liquids and some solids.  She is here to discuss systemic treatment options.  She saw the genetics counselor yesterday.  Objective:  Vital signs in last 24 hours:  Blood pressure 127/83, pulse 99, temperature 99.2 F (37.3 C), temperature source Tympanic, resp. rate 18, height 5\' 4"  (1.626 m), weight 256 lb 3.2 oz (116.2 kg), SpO2 100 %.    Resp: Lungs clear bilaterally Cardio: Regular rate and rhythm GI: No hepatomegaly, no mass, tender throughout the upper abdomen Vascular: No leg edema    Portacath/PICC-without erythema  Lab Results:  Lab Results  Component Value Date   WBC 7.7 02/28/2021   HGB 8.0 (L) 02/28/2021   HCT 27.1 (L) 02/28/2021   MCV 85.0 02/28/2021   PLT 299 02/28/2021   NEUTROABS 5.9 02/28/2021    CMP  Lab Results  Component Value Date   NA 139 02/28/2021   K 3.8 02/28/2021   CL 101 02/28/2021   CO2 28 02/28/2021   GLUCOSE 86 02/28/2021   BUN 8 02/28/2021   CREATININE 0.67 02/28/2021   CALCIUM 9.1 02/28/2021   PROT 6.1 (L) 02/28/2021   ALBUMIN 2.9 (L) 02/28/2021   AST 31 02/28/2021   ALT 26 02/28/2021   ALKPHOS 72 02/28/2021   BILITOT 0.8 02/28/2021   GFRNONAA >60 02/28/2021   GFRAA >60 07/21/2020     Medications: I have reviewed the patient's current medications.  Assessment/plan: 1.  Pancreatic/duodenal mass, biopsy pending -CT abdomen/pelvis without contrast 02/16/2021-multiple large hypoechoic masses in the patient's liver consistent metastatic disease, ill-defined masslike area in the pancreatic head measuring approximate 4.7 cm,  possible filling defect in the right ventricle. -EGD 02/19/2021-large infiltrative mass with bleeding in the second portion of the duodenum, appears to be arising near the ampulla, partially obstructive with friable mucosa.  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 -Flexible sigmoidoscopy 02/19/2021-diverticulosis in the sigmoid colon, nonbleeding external and internal hemorrhoids -Cardiac CT 02/20/2021-mass in the mid RV attached to the interventricular septum measuring 16 mm x 6 mm and concerning for metastatic tumor -MRI abdomen with/without contrast 02/21/2021-mass in the posterior pancreatic head measuring 4.3 x 3.8 cm with obstruction of the central pancreatic duct and diffuse dilatation up to 6 mm consistent with pancreatic adenocarcinoma, liver lesions the largest mass of the central left lobe measuring 9.9 x 9.2 cm consistent with hepatic metastatic disease,  hepatomegaly. -Ultrasound-guided biopsy of a left liver lesion 02/24/2021-adenocarcinoma, morphologically similar to the duodenal biopsy -Palliative radiation to the duodenal mass 02/25/2021-03/06/2021 2. Iron deficiency anemia -Feraheme 510 mg 02/20/2021 3. Protein calorie malnutrition 4. Possible right ventricular filling defect on noncontrast CT scan 5. Hypertension 6. History of CVA 7. Hyperlipidemia 8. Hypothyroidism 9. Morbid obesity 10. Asthma 11. Migraines 12.  Pain secondary to #1 13.  Port-A-Cath placement 02/25/2021     Disposition: Ms. Coco has been diagnosed with metastatic pancreas cancer.  She will complete a course of palliative radiation to the pancreas/duodenal mass today.  She continues to have pain and nausea.  She was confirmed to have metastatic disease involving the liver on a biopsy 02/24/2021.  There is  radiologic evidence of metastatic disease involving the heart.  She understands no therapy will be curative.  We discussed treatment options.  I recommend  FOLFIRINOX.  The plan is to begin with FOLFOX and add irinotecan after 1 or 2 cycles depending on her tolerance of the FOLFOX regimen.  We reviewed potential toxicities associated with the FOLFOX regimen including the chance for nausea/vomiting, mucositis, diarrhea, alopecia, and hematologic toxicity.  We discussed the sun sensitivity, rash, hyperpigmentation, and hand/foot syndrome seen with 5-fluorouracil.  We reviewed the allergic reaction and various types of neuropathy associated with oxaliplatin.  She agrees to proceed.  She will attend a chemotherapy teaching class.  Ms. Killilea will be scheduled for cycle 1 FOLFOX during the week of 03/16/2021.  We will obtain baseline labs to include a CA 19-9 when she returns for the chemotherapy teaching class next week.  We will follow up on results on Foundation 1 testing on the liver biopsy.  A chemotherapy plan was entered today.  I refilled her prescription for MS Contin.  Betsy Coder, MD  03/06/2021  1:21 PM

## 2021-03-06 NOTE — Progress Notes (Signed)
START ON PATHWAY REGIMEN - Pancreatic Adenocarcinoma     A cycle is every 14 days:     Oxaliplatin      Leucovorin      Irinotecan      Fluorouracil   **Always confirm dose/schedule in your pharmacy ordering system**  Patient Characteristics: Metastatic Disease, First Line, PS = 0,1, BRCA1/2 and PALB2  Mutation Absent/Unknown Therapeutic Status: Metastatic Disease Line of Therapy: First Line ECOG Performance Status: 1 BRCA1/2 Mutation Status: Awaiting Test Results PALB2 Mutation Status: Awaiting Test Results Intent of Therapy: Non-Curative / Palliative Intent, Discussed with Patient

## 2021-03-09 ENCOUNTER — Ambulatory Visit: Payer: Medicaid Other

## 2021-03-09 NOTE — Progress Notes (Signed)
  Patient Name: Wendy Hoffman MRN: [id number] DOB: [birth year] Referring Physician: Betsy Coder (Profile Not Attached) Date of Service: 03/06/2021 [facility], [place]                                                        End Of Treatment Note  Diagnoses: C25.0-Malignant neoplasm of head of pancreas  Cancer Staging: Stage IV adenocarcinoma of the pancreatic head  Intent: Palliative  Radiation Treatment Dates: 02/25/2021 through 03/06/2021 Site Technique Total Dose (Gy) Dose per Fx (Gy) Completed Fx Beam Energies  Abdomen: Abd 3D 28/28 3.5 8/8 15X   Narrative: The patient tolerated radiation therapy relatively well. She does have disease in the septum of her heart and developed a few episodes of pain that were difficult to discern from her pancreatic pain. On her second to last treatment she was evaluated and felt to be stable from a cardiopulmonary standpoint.  Plan: The patient will receive a call in about one month from the radiation oncology department. She will continue follow up with Dr. Benay Spice as well.   ________________________________________________    Carola Rhine, [facility]

## 2021-03-10 ENCOUNTER — Ambulatory Visit: Payer: Medicaid Other

## 2021-03-10 ENCOUNTER — Other Ambulatory Visit: Payer: Self-pay | Admitting: Oncology

## 2021-03-10 DIAGNOSIS — C250 Malignant neoplasm of head of pancreas: Secondary | ICD-10-CM

## 2021-03-11 ENCOUNTER — Ambulatory Visit: Payer: Medicaid Other

## 2021-03-12 ENCOUNTER — Other Ambulatory Visit: Payer: Self-pay

## 2021-03-12 ENCOUNTER — Telehealth: Payer: Self-pay | Admitting: Nurse Practitioner

## 2021-03-12 ENCOUNTER — Encounter (HOSPITAL_COMMUNITY): Payer: Self-pay

## 2021-03-12 NOTE — Telephone Encounter (Signed)
R/s appts per 3/31 sch msg. Pt aware.  

## 2021-03-14 ENCOUNTER — Other Ambulatory Visit: Payer: Self-pay | Admitting: Oncology

## 2021-03-14 DIAGNOSIS — C250 Malignant neoplasm of head of pancreas: Secondary | ICD-10-CM

## 2021-03-16 ENCOUNTER — Inpatient Hospital Stay: Payer: Medicaid Other | Attending: Nurse Practitioner

## 2021-03-16 ENCOUNTER — Inpatient Hospital Stay: Payer: Medicaid Other

## 2021-03-16 ENCOUNTER — Other Ambulatory Visit: Payer: Self-pay

## 2021-03-16 DIAGNOSIS — C250 Malignant neoplasm of head of pancreas: Secondary | ICD-10-CM | POA: Diagnosis not present

## 2021-03-16 DIAGNOSIS — R112 Nausea with vomiting, unspecified: Secondary | ICD-10-CM | POA: Insufficient documentation

## 2021-03-16 DIAGNOSIS — C787 Secondary malignant neoplasm of liver and intrahepatic bile duct: Secondary | ICD-10-CM | POA: Insufficient documentation

## 2021-03-16 DIAGNOSIS — Z5111 Encounter for antineoplastic chemotherapy: Secondary | ICD-10-CM | POA: Diagnosis not present

## 2021-03-16 DIAGNOSIS — D509 Iron deficiency anemia, unspecified: Secondary | ICD-10-CM | POA: Insufficient documentation

## 2021-03-16 DIAGNOSIS — I10 Essential (primary) hypertension: Secondary | ICD-10-CM | POA: Insufficient documentation

## 2021-03-16 DIAGNOSIS — Z79899 Other long term (current) drug therapy: Secondary | ICD-10-CM | POA: Insufficient documentation

## 2021-03-16 DIAGNOSIS — E039 Hypothyroidism, unspecified: Secondary | ICD-10-CM | POA: Insufficient documentation

## 2021-03-16 DIAGNOSIS — E785 Hyperlipidemia, unspecified: Secondary | ICD-10-CM | POA: Diagnosis not present

## 2021-03-16 DIAGNOSIS — R5383 Other fatigue: Secondary | ICD-10-CM | POA: Insufficient documentation

## 2021-03-16 DIAGNOSIS — Z8673 Personal history of transient ischemic attack (TIA), and cerebral infarction without residual deficits: Secondary | ICD-10-CM | POA: Diagnosis not present

## 2021-03-16 DIAGNOSIS — Z95828 Presence of other vascular implants and grafts: Secondary | ICD-10-CM

## 2021-03-16 LAB — CMP (CANCER CENTER ONLY)
ALT: 15 U/L (ref 0–44)
AST: 30 U/L (ref 15–41)
Albumin: 3.2 g/dL — ABNORMAL LOW (ref 3.5–5.0)
Alkaline Phosphatase: 110 U/L (ref 38–126)
Anion gap: 13 (ref 5–15)
BUN: 12 mg/dL (ref 6–20)
CO2: 25 mmol/L (ref 22–32)
Calcium: 9.4 mg/dL (ref 8.9–10.3)
Chloride: 101 mmol/L (ref 98–111)
Creatinine: 0.77 mg/dL (ref 0.44–1.00)
GFR, Estimated: >60 mL/min (ref >60)
Glucose, Bld: 101 mg/dL — ABNORMAL HIGH (ref 70–99)
Potassium: 4 mmol/L (ref 3.5–5.1)
Sodium: 139 mmol/L (ref 135–145)
Total Bilirubin: 0.5 mg/dL (ref 0.3–1.2)
Total Protein: 7.5 g/dL (ref 6.5–8.1)

## 2021-03-16 LAB — CBC WITH DIFFERENTIAL (CANCER CENTER ONLY)
Abs Immature Granulocytes: 0.05 10*3/uL (ref 0.00–0.07)
Basophils Absolute: 0 10*3/uL (ref 0.0–0.1)
Basophils Relative: 1 %
Eosinophils Absolute: 0.3 10*3/uL (ref 0.0–0.5)
Eosinophils Relative: 4 %
HCT: 33.1 % — ABNORMAL LOW (ref 36.0–46.0)
Hemoglobin: 10.1 g/dL — ABNORMAL LOW (ref 12.0–15.0)
Immature Granulocytes: 1 %
Lymphocytes Relative: 14 %
Lymphs Abs: 1.2 10*3/uL (ref 0.7–4.0)
MCH: 25.4 pg — ABNORMAL LOW (ref 26.0–34.0)
MCHC: 30.5 g/dL (ref 30.0–36.0)
MCV: 83.4 fL (ref 80.0–100.0)
Monocytes Absolute: 0.6 10*3/uL (ref 0.1–1.0)
Monocytes Relative: 7 %
Neutro Abs: 6.1 10*3/uL (ref 1.7–7.7)
Neutrophils Relative %: 73 %
Platelet Count: 315 10*3/uL (ref 150–400)
RBC: 3.97 MIL/uL (ref 3.87–5.11)
RDW: 24.5 % — ABNORMAL HIGH (ref 11.5–15.5)
WBC Count: 8.2 10*3/uL (ref 4.0–10.5)
nRBC: 0 % (ref 0.0–0.2)

## 2021-03-16 MED ORDER — SODIUM CHLORIDE 0.9% FLUSH
10.0000 mL | Freq: Once | INTRAVENOUS | Status: AC
  Administered 2021-03-16: 10 mL
  Filled 2021-03-16: qty 10

## 2021-03-17 ENCOUNTER — Telehealth: Payer: Self-pay

## 2021-03-17 ENCOUNTER — Inpatient Hospital Stay: Payer: Medicaid Other

## 2021-03-17 ENCOUNTER — Encounter: Payer: Self-pay | Admitting: Nurse Practitioner

## 2021-03-17 ENCOUNTER — Inpatient Hospital Stay (HOSPITAL_BASED_OUTPATIENT_CLINIC_OR_DEPARTMENT_OTHER): Payer: Medicaid Other | Admitting: Nurse Practitioner

## 2021-03-17 VITALS — BP 127/78 | HR 84 | Temp 97.8°F | Resp 20 | Ht 64.0 in | Wt 246.2 lb

## 2021-03-17 VITALS — BP 149/92 | HR 88 | Temp 98.4°F | Resp 20

## 2021-03-17 DIAGNOSIS — Z5111 Encounter for antineoplastic chemotherapy: Secondary | ICD-10-CM | POA: Diagnosis not present

## 2021-03-17 DIAGNOSIS — C250 Malignant neoplasm of head of pancreas: Secondary | ICD-10-CM

## 2021-03-17 LAB — BASIC METABOLIC PANEL - CANCER CENTER ONLY
Anion gap: 10 (ref 5–15)
BUN: 11 mg/dL (ref 6–20)
CO2: 25 mmol/L (ref 22–32)
Calcium: 9.5 mg/dL (ref 8.9–10.3)
Chloride: 99 mmol/L (ref 98–111)
Creatinine: 0.61 mg/dL (ref 0.44–1.00)
GFR, Estimated: >60 mL/min (ref >60)
Glucose, Bld: 162 mg/dL — ABNORMAL HIGH (ref 70–99)
Potassium: 3.7 mmol/L (ref 3.5–5.1)
Sodium: 134 mmol/L — ABNORMAL LOW (ref 135–145)

## 2021-03-17 LAB — CANCER ANTIGEN 19-9: CA 19-9: 22432 U/mL — ABNORMAL HIGH (ref 0–35)

## 2021-03-17 MED ORDER — SODIUM CHLORIDE 0.9 % IV SOLN
2000.0000 mg/m2 | INTRAVENOUS | Status: DC
  Administered 2021-03-17: 4600 mg via INTRAVENOUS
  Filled 2021-03-17: qty 92

## 2021-03-17 MED ORDER — DEXTROSE 5 % IV SOLN
Freq: Once | INTRAVENOUS | Status: AC
  Filled 2021-03-17: qty 250

## 2021-03-17 MED ORDER — PALONOSETRON HCL INJECTION 0.25 MG/5ML
0.2500 mg | Freq: Once | INTRAVENOUS | Status: AC
Start: 2021-03-17 — End: 2021-03-17
  Administered 2021-03-17: 0.25 mg via INTRAVENOUS
  Filled 2021-03-17: qty 5

## 2021-03-17 MED ORDER — OXYCODONE HCL 5 MG PO TABS
5.0000 mg | ORAL_TABLET | Freq: Once | ORAL | Status: AC
  Administered 2021-03-17: 5 mg via ORAL

## 2021-03-17 MED ORDER — LEUCOVORIN CALCIUM INJECTION 350 MG
400.0000 mg/m2 | Freq: Once | INTRAVENOUS | Status: AC
  Administered 2021-03-17: 916 mg via INTRAVENOUS
  Filled 2021-03-17: qty 45.8

## 2021-03-17 MED ORDER — SODIUM CHLORIDE 0.9 % IV SOLN
10.0000 mg | Freq: Once | INTRAVENOUS | Status: AC
Start: 2021-03-17 — End: 2021-03-17
  Administered 2021-03-17: 10 mg via INTRAVENOUS
  Filled 2021-03-17: qty 10

## 2021-03-17 MED ORDER — SODIUM CHLORIDE 0.9 % IV SOLN: 400.0000 mg/m2 | Freq: Once | INTRAVENOUS | Status: DC

## 2021-03-17 MED ORDER — ACETAMINOPHEN 325 MG PO TABS
325.0000 mg | ORAL_TABLET | Freq: Once | ORAL | Status: AC
  Administered 2021-03-17: 325 mg via ORAL

## 2021-03-17 MED ORDER — OXALIPLATIN CHEMO INJECTION 100 MG/20ML
85.0000 mg/m2 | Freq: Once | INTRAVENOUS | Status: AC
  Administered 2021-03-17: 195 mg via INTRAVENOUS
  Filled 2021-03-17: qty 39

## 2021-03-17 NOTE — Progress Notes (Addendum)
[facility] OFFICE PROGRESS NOTE   Diagnosis: Pancreas cancer  INTERVAL HISTORY:   Wendy Hoffman returns as scheduled.  She continues to have intermittent nausea/vomiting.  No diarrhea.  Pain continues at the upper abdomen.  She is on MS Contin 30 mg every 12 hours with Percocet every 4 hours as needed.  Percocet provides some relief.  She is fatigued.  Objective:  Vital signs in last 24 hours:  Blood pressure 127/78, pulse 84, temperature 97.8 F (36.6 C), temperature source Tympanic, resp. rate 20, height '5\' 4"'  (1.626 m), weight 246 lb 3.2 oz (111.7 kg), SpO2 100 %.    HEENT: No thrush or ulcers. Resp: Lungs clear bilaterally. Cardio: Regular rate and rhythm. GI: Abdomen is soft with tenderness across the upper abdomen.  No hepatomegaly.  No mass. Vascular: No leg edema. Skin: Palms without erythema. Port-A-Cath without erythema.   Lab Results:  Lab Results  Component Value Date   WBC 8.2 03/16/2021   HGB 10.1 (L) 03/16/2021   HCT 33.1 (L) 03/16/2021   MCV 83.4 03/16/2021   PLT 315 03/16/2021   NEUTROABS 6.1 03/16/2021    Imaging:  No results found.  Medications: I have reviewed the patient's current medications.  Assessment/Plan: 1.Pancreatic/duodenal mass, biopsy pending -CT abdomen/pelvis without contrast 02/16/2021-multiple large hypoechoic masses in the patient's liver consistent metastatic disease, ill-defined masslike area in the pancreatic head measuring approximate 4.7 cm, possible filling defect in the right ventricle. -EGD 02/19/2021-large infiltrative mass with bleeding in the second portion of the duodenum, appears to be arising near the ampulla, partially obstructive with friable mucosa.  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 -Flexible sigmoidoscopy 02/19/2021-diverticulosis in the sigmoid colon, nonbleeding external and internal hemorrhoids -Cardiac CT 02/20/2021-mass in the mid RV attached to the  interventricular septum measuring 16 mm x 6 mm and concerning for metastatic tumor -MRI abdomen with/without contrast 02/21/2021-mass in the posterior pancreatic head measuring 4.3 x 3.8 cm with obstruction of the central pancreatic duct and diffuse dilatation up to 6 mm consistent with pancreatic adenocarcinoma, liver lesions the largest mass of the central left lobe measuring 9.9 x 9.2 cm consistent with hepatic metastatic disease,  hepatomegaly. -Ultrasound-guided biopsy of a left liver lesion 02/24/2021-adenocarcinoma, morphologically similar to the duodenal biopsy; microsatellite stable, tumor mutation burden 1 -Palliative radiation to the duodenal mass 02/25/2021-03/06/2021 -Cycle 1 FOLFOX 03/17/2021 2. Iron deficiency anemia -Feraheme 510 mg 02/20/2021 3. Protein calorie malnutrition 4. Possible right ventricular filling defect on noncontrast CT scan  MRI cardiac morphology 02/21/2021-mass in the mid RV attached to the interventricular septum measures 16 mm x 6 mm.  Mass appears heterogeneous with area of enhancement on LGE imaging.  Mass is concerning for metastatic tumor.  Normal LV size and systolic function.  Normal RV size and systolic function. 5. Hypertension 6. History of CVA 7. Hyperlipidemia 8. Hypothyroidism 9. Morbid obesity 10. Asthma 11. Migraines 12.  Pain secondary to #1 13.  Port-A-Cath placement 02/25/2021  Disposition: Ms. Gatt has metastatic pancreas cancer.  She is scheduled to begin treatment today on the FOLFOX regimen.  The overall plan is to add irinotecan after 1 or 2 cycles pending tolerance of FOLFOX.  She attended a chemotherapy education class yesterday.  We again reviewed potential toxicities.  She agrees to proceed.  We reviewed the CBC and chemistry panel from 03/16/2021.  Labs are adequate to proceed with FOLFOX as above.  She is symptomatic with pain and nausea.  She will continue the current narcotic and antiemetic  regimens.  She will return  for lab, follow-up, cycle 2 FOLFOX in 2 weeks.  She will contact the office in the interim with any problems.  Patient seen with Dr. Benay Spice.    [name]   03/17/2021  9:20 AM  This was a shared visit with Ned Card.  Ms. Matty continues to have pain and nausea secondary to the metastatic pancreas cancer.  She will begin FOLFOX today.  I was present for greater than 50% of today's visit.  I perform medical decision making.  Julieanne Manson, MD

## 2021-03-17 NOTE — Telephone Encounter (Signed)
Pt called concerning this mornings appt  no answer unable to leave a voicemail , this nurse calls contact #2 Gloris Ham with no response message left with contact and address of new cancer location currently awaiting a return call

## 2021-03-17 NOTE — Patient Instructions (Signed)
[facility] Discharge Instructions for Patients Receiving Chemotherapy  Today you received the following chemotherapy agents Oxaliplatin Leucovorin and fluorouracil  To help prevent nausea and vomiting after your treatment, we encourage you to take your nausea medication  Compazine and then on Friday can add Ondansetron    If you develop nausea and vomiting that is not controlled by your nausea medication, call the clinic.   BELOW ARE SYMPTOMS THAT SHOULD BE REPORTED IMMEDIATELY:  *FEVER GREATER THAN 100.5 F  *CHILLS WITH OR WITHOUT FEVER  NAUSEA AND VOMITING THAT IS NOT CONTROLLED WITH YOUR NAUSEA MEDICATION  *UNUSUAL SHORTNESS OF BREATH  *UNUSUAL BRUISING OR BLEEDING  TENDERNESS IN MOUTH AND THROAT WITH OR WITHOUT PRESENCE OF ULCERS  *URINARY PROBLEMS  *BOWEL PROBLEMS  UNUSUAL RASH Items with * indicate a potential emergency and should be followed up as soon as possible.  Feel free to call the clinic should you have any questions or concerns at The clinic phone number is [phone].  Please show the [facility] at check-in to the Emergency Department and triage nurse.  Leucovorin injection What is this medicine? LEUCOVORIN (loo koe VOR in) is used to prevent or treat the harmful effects of some medicines. This medicine is used to treat anemia caused by a low amount of folic acid in the body. It is also used with 5-fluorouracil (5-FU) to treat colon cancer. This medicine may be used for other purposes; ask your health care provider or pharmacist if you have questions. What should I tell my health care provider before I take this medicine? They need to know if you have any of these conditions:  anemia from low levels of vitamin B-12 in the blood  an unusual or allergic reaction to leucovorin, folic acid, other medicines, foods, dyes, or preservatives  pregnant or trying to get pregnant  breast-feeding How should I use this  medicine? This medicine is for injection into a muscle or into a vein. It is given by a health care professional in a hospital or clinic setting. Talk to your pediatrician regarding the use of this medicine in children. Special care may be needed. Overdosage: If you think you have taken too much of this medicine contact a poison control center or emergency room at once. NOTE: This medicine is only for you. Do not share this medicine with others. What if I miss a dose? This does not apply. What may interact with this medicine?  capecitabine  fluorouracil  phenobarbital  phenytoin  primidone  trimethoprim-sulfamethoxazole This list may not describe all possible interactions. Give your health care provider a list of all the medicines, herbs, non-prescription drugs, or dietary supplements you use. Also tell them if you smoke, drink alcohol, or use illegal drugs. Some items may interact with your medicine. What should I watch for while using this medicine? Your condition will be monitored carefully while you are receiving this medicine. This medicine may increase the side effects of 5-fluorouracil, 5-FU. Tell your doctor or health care professional if you have diarrhea or mouth sores that do not get better or that get worse. What side effects may I notice from receiving this medicine? Side effects that you should report to your doctor or health care professional as soon as possible:  allergic reactions like skin rash, itching or hives, swelling of the face, lips, or tongue  breathing problems  fever, infection  mouth sores  unusual bleeding or bruising  unusually weak or tired Side effects  that usually do not require medical attention (report to your doctor or health care professional if they continue or are bothersome):  constipation or diarrhea  loss of appetite  nausea, vomiting This list may not describe all possible side effects. Call your doctor for medical advice about  side effects. You may report side effects to FDA at 1-800-FDA-1088. Where should I keep my medicine? This drug is given in a hospital or clinic and will not be stored at home. NOTE: This sheet is a summary. It may not cover all possible information. If you have questions about this medicine, talk to your doctor, pharmacist, or health care provider.  2021 Elsevier/Gold Standard (2008-06-04 16:50:29) Oxaliplatin Injection What is this medicine? OXALIPLATIN (ox AL i PLA tin) is a chemotherapy drug. It targets fast dividing cells, like cancer cells, and causes these cells to die. This medicine is used to treat cancers of the colon and rectum, and many other cancers. This medicine may be used for other purposes; ask your health care provider or pharmacist if you have questions. COMMON BRAND NAME(S): Eloxatin What should I tell my health care provider before I take this medicine? They need to know if you have any of these conditions:  heart disease  history of irregular heartbeat  liver disease  low blood counts, like white cells, platelets, or red blood cells  lung or breathing disease, like asthma  take medicines that treat or prevent blood clots  tingling of the fingers or toes, or other nerve disorder  an unusual or allergic reaction to oxaliplatin, other chemotherapy, other medicines, foods, dyes, or preservatives  pregnant or trying to get pregnant  breast-feeding How should I use this medicine? This drug is given as an infusion into a vein. It is administered in a hospital or clinic by a specially trained health care professional. Talk to your pediatrician regarding the use of this medicine in children. Special care may be needed. Overdosage: If you think you have taken too much of this medicine contact a poison control center or emergency room at once. NOTE: This medicine is only for you. Do not share this medicine with others. What if I miss a dose? It is important not to  miss a dose. Call your doctor or health care professional if you are unable to keep an appointment. What may interact with this medicine? Do not take this medicine with any of the following medications:  cisapride  dronedarone  pimozide  thioridazine This medicine may also interact with the following medications:  aspirin and aspirin-like medicines  certain medicines that treat or prevent blood clots like warfarin, apixaban, dabigatran, and rivaroxaban  cisplatin  cyclosporine  diuretics  medicines for infection like acyclovir, adefovir, amphotericin B, bacitracin, cidofovir, foscarnet, ganciclovir, gentamicin, pentamidine, vancomycin  NSAIDs, medicines for pain and inflammation, like ibuprofen or naproxen  other medicines that prolong the QT interval (an abnormal heart rhythm)  pamidronate  zoledronic acid This list may not describe all possible interactions. Give your health care provider a list of all the medicines, herbs, non-prescription drugs, or dietary supplements you use. Also tell them if you smoke, drink alcohol, or use illegal drugs. Some items may interact with your medicine. What should I watch for while using this medicine? Your condition will be monitored carefully while you are receiving this medicine. You may need blood work done while you are taking this medicine. This medicine may make you feel generally unwell. This is not uncommon as chemotherapy can affect healthy cells  as well as cancer cells. Report any side effects. Continue your course of treatment even though you feel ill unless your healthcare professional tells you to stop. This medicine can make you more sensitive to cold. Do not drink cold drinks or use ice. Cover exposed skin before coming in contact with cold temperatures or cold objects. When out in cold weather wear warm clothing and cover your mouth and nose to warm the air that goes into your lungs. Tell your doctor if you get sensitive to  the cold. Do not become pregnant while taking this medicine or for 9 months after stopping it. Women should inform their health care professional if they wish to become pregnant or think they might be pregnant. Men should not father a child while taking this medicine and for 6 months after stopping it. There is potential for serious side effects to an unborn child. Talk to your health care professional for more information. Do not breast-feed a child while taking this medicine or for 3 months after stopping it. This medicine has caused ovarian failure in some women. This medicine may make it more difficult to get pregnant. Talk to your health care professional if you are concerned about your fertility. This medicine has caused decreased sperm counts in some men. This may make it more difficult to father a child. Talk to your health care professional if you are concerned about your fertility. This medicine may increase your risk of getting an infection. Call your health care professional for advice if you get a fever, chills, or sore throat, or other symptoms of a cold or flu. Do not treat yourself. Try to avoid being around people who are sick. Avoid taking medicines that contain aspirin, acetaminophen, ibuprofen, naproxen, or ketoprofen unless instructed by your health care professional. These medicines may hide a fever. Be careful brushing or flossing your teeth or using a toothpick because you may get an infection or bleed more easily. If you have any dental work done, tell your dentist you are receiving this medicine. What side effects may I notice from receiving this medicine? Side effects that you should report to your doctor or health care professional as soon as possible:  allergic reactions like skin rash, itching or hives, swelling of the face, lips, or tongue  breathing problems  cough  low blood counts - this medicine may decrease the number of white blood cells, red blood cells, and  platelets. You may be at increased risk for infections and bleeding  nausea, vomiting  pain, redness, or irritation at site where injected  pain, tingling, numbness in the hands or feet  signs and symptoms of bleeding such as bloody or black, tarry stools; red or dark brown urine; spitting up blood or brown material that looks like coffee grounds; red spots on the skin; unusual bruising or bleeding from the eyes, gums, or nose  signs and symptoms of a dangerous change in heartbeat or heart rhythm like chest pain; dizziness; fast, irregular heartbeat; palpitations; feeling faint or lightheaded; falls  signs and symptoms of infection like fever; chills; cough; sore throat; pain or trouble passing urine  signs and symptoms of liver injury like dark yellow or brown urine; general ill feeling or flu-like symptoms; light-colored stools; loss of appetite; nausea; right upper belly pain; unusually weak or tired; yellowing of the eyes or skin  signs and symptoms of low red blood cells or anemia such as unusually weak or tired; feeling faint or lightheaded; falls  signs  and symptoms of muscle injury like dark urine; trouble passing urine or change in the amount of urine; unusually weak or tired; muscle pain; back pain Side effects that usually do not require medical attention (report to your doctor or health care professional if they continue or are bothersome):  changes in taste  diarrhea  gas  hair loss  loss of appetite  mouth sores This list may not describe all possible side effects. Call your doctor for medical advice about side effects. You may report side effects to FDA at 1-800-FDA-1088. Where should I keep my medicine? This drug is given in a hospital or clinic and will not be stored at home. NOTE: This sheet is a summary. It may not cover all possible information. If you have questions about this medicine, talk to your doctor, pharmacist, or health care provider.  2021  Elsevier/Gold Standard (2019-04-18 12:20:35) [facility] Discharge Instructions for Patients receiving Home Portable Chemo Pump    **The bag should finish at 46 hours, 96 hours or 7 days. For example, if your pump is scheduled for 46 hours and it was put on at 4pm, it should finish at 2 pm the day it is scheduled to come off regardless of your appointment time.    Estimated time to finish   _________________________ (Have your nurse fill in)     ** if the display on your pump reads "Low Volume" and it is beeping, take the batteries out of the pump and come to the cancer center for it to be taken off.   **If the pump alarms go off prior to the pump reading "Low Volume" then call the [phone] and someone can assist you.  **If the plunger comes out and the bag fluid is running out, please use your chemo spill kit to clean up the spill. Do not use paper towels or other house hold products.  ** If you have problems or questions regarding your pump, please call either the [phone] or the cancer center Monday-Friday 8:00am-4:30pm at [phone] and we will assist you.

## 2021-03-17 NOTE — Progress Notes (Signed)
At the end of Oxaliplaitn/Leucovorin infusion pt c/o feeling lightheaded and clammy. NS IV fluids started and VSS of 155/104 , pulse 81 O2 sat 100% and temp 98.4. Ned Card NP and Dr Benay Spice to chair side. BCG obtained and 139 was result. Continued to monitor for an additional 30 minutes giving fluids. Patient stable at discharge to family. Instraucted to call with any issues and discharge instructions reviewed in detail

## 2021-03-19 ENCOUNTER — Other Ambulatory Visit: Payer: Self-pay | Admitting: Nurse Practitioner

## 2021-03-19 ENCOUNTER — Other Ambulatory Visit: Payer: Self-pay

## 2021-03-19 ENCOUNTER — Inpatient Hospital Stay: Payer: Medicaid Other

## 2021-03-19 VITALS — BP 146/92 | HR 88 | Resp 20

## 2021-03-19 DIAGNOSIS — Z5111 Encounter for antineoplastic chemotherapy: Secondary | ICD-10-CM | POA: Diagnosis not present

## 2021-03-19 DIAGNOSIS — C250 Malignant neoplasm of head of pancreas: Secondary | ICD-10-CM

## 2021-03-19 MED ORDER — HEPARIN SOD (PORK) LOCK FLUSH 100 UNIT/ML IV SOLN
500.0000 [IU] | Freq: Once | INTRAVENOUS | Status: AC | PRN
  Administered 2021-03-19: 500 [IU]
  Filled 2021-03-19: qty 5

## 2021-03-19 MED ORDER — PROCHLORPERAZINE MALEATE 10 MG PO TABS: 10.0000 mg | ORAL_TABLET | Freq: Four times a day (QID) | ORAL | 0 refills | Status: DC | PRN

## 2021-03-19 MED ORDER — SODIUM CHLORIDE 0.9% FLUSH
10.0000 mL | INTRAVENOUS | Status: DC | PRN
  Administered 2021-03-19: 10 mL
  Filled 2021-03-19: qty 10

## 2021-03-19 NOTE — Patient Instructions (Signed)

## 2021-03-20 ENCOUNTER — Other Ambulatory Visit: Payer: Self-pay | Admitting: Oncology

## 2021-03-23 ENCOUNTER — Encounter: Payer: Self-pay | Admitting: Genetic Counselor

## 2021-03-23 ENCOUNTER — Ambulatory Visit: Payer: Self-pay | Admitting: Genetic Counselor

## 2021-03-23 ENCOUNTER — Telehealth: Payer: Self-pay | Admitting: Genetic Counselor

## 2021-03-23 DIAGNOSIS — C250 Malignant neoplasm of head of pancreas: Secondary | ICD-10-CM

## 2021-03-23 DIAGNOSIS — Z1379 Encounter for other screening for genetic and chromosomal anomalies: Secondary | ICD-10-CM

## 2021-03-23 DIAGNOSIS — Z807 Family history of other malignant neoplasms of lymphoid, hematopoietic and related tissues: Secondary | ICD-10-CM

## 2021-03-23 DIAGNOSIS — Z800 Family history of malignant neoplasm of digestive organs: Secondary | ICD-10-CM

## 2021-03-23 NOTE — Progress Notes (Signed)
HPI:  Ms. Revell was previously seen in the [facility] clinic due to a personal history of cancer and concerns regarding a hereditary predisposition to cancer. Please refer to our prior cancer genetics clinic note for more information regarding our discussion, assessment and recommendations, at the time. Ms. Saperstein recent genetic test results were disclosed to her, as were recommendations warranted by these results. These results and recommendations are discussed in more detail below.  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  The report date is March 23, 2021.   The Multi-Cancer Panel with pancreatitis genes and preliminary pancreatic cancer genes offered by [facility] includes sequencing and/or deletion duplication testing of the following 91 genes: AIP, ALK, APC, ATM, AXIN2,BAP1,  BARD1, BLM, BMPR1A, BRCA1, BRCA2, BRIP1, CASR, CDC73, CDH1, CDK4, CDKN1B, CDKN1C, CDKN2A (p14ARF), CDKN2A (p16INK4a), CEBPA, CFTR, CHEK2, CPA1, CTNNA1, CTRC, DICER1, DIS3L2, EGFR (c.2369C>T, p.Thr790Met variant only), EPCAM (Deletion/duplication testing only), FANCC, FH, FLCN, GATA2, GPC3, GREM1 (Promoter region deletion/duplication testing only), HOXB13 (c.251G>A, p.Gly84Glu), HRAS, KIT, MAX, MEN1, MET, MITF (c.952G>A, p.Glu318Lys variant only), MLH1, MSH2, MSH3, MSH6, MUTYH, NBN, NF1, NF2, NTHL1, PALB2, PALLD, PDGFRA, PHOX2B, PMS2, POLD1,  POLE, POT1, PRKAR1A, PRSS1, PTCH1, PTEN, RAD50, RAD51C, RAD51D, RB1, RECQL4, RET, RNF43, RUNX1, SDHAF2, SDHA (sequence changes only), SDHB, SDHC, SDHD, SMAD4, SMARCA4, SMARCB1, SMARCE1, SPINK1, STK11, SUFU, TERC, TERT, TMEM127, TP53, TSC1, TSC2, VHL, WRN and WT1.      FAMILY HISTORY:  We obtained a detailed, 4-generation family history.  Significant diagnoses are listed below: Family History  Problem Relation Age of Onset  . Non-Hodgkin's lymphoma Other        maternal half brother's grandson  . Non-Hodgkin's lymphoma Half-Brother        maternal half brother; dx 32s or 87s  . Pancreatic cancer Other 84       MGF's niece    Ms. Jeter has two sons and one daughter, all in their late 12s or early 59s.  Ms. Broussard has two maternal half sisters and three maternal half brothers.  Her sister had NHL diagnosed in her 92s or 46s.  Ms. Sippel also reported a distant relative, her maternal grandfather's niece, with pancreatic cancer.  This relative reported a TP53 mutation (no report to confirm somatic or germline in origin). Ms. Skorupski had limited information about her paternal family.   Ms. [place] is unaware of previous family history of genetic testing for hereditary cancer risks. Patient's maternal ancestors are of [place] descent, and paternal ancestors are of African American descent. There is no reported Ashkenazi Jewish ancestry. There is no known consanguinity.  GENETIC TEST RESULTS: Genetic testing reported out on March 23, 2021.  The Multi-Cancer Panel through [facility] found no pathogenic mutations. The Multi-Cancer Panel with pancreatitis genes and preliminary pancreatic cancer genes offered by [facility] includes sequencing and/or deletion duplication testing of the following 91 genes: AIP, ALK, APC, ATM, AXIN2,BAP1,  BARD1, BLM, BMPR1A,  BRCA1, BRCA2, BRIP1, CASR, CDC73, CDH1, CDK4, CDKN1B, CDKN1C, CDKN2A (p14ARF), CDKN2A (p16INK4a), CEBPA, CFTR, CHEK2, CPA1, CTNNA1, CTRC, DICER1,  DIS3L2, EGFR (c.2369C>T, p.Thr790Met variant only), EPCAM (Deletion/duplication testing only), FANCC, FH, FLCN, GATA2, GPC3, GREM1 (Promoter region deletion/duplication testing only), HOXB13 (c.251G>A, p.Gly84Glu), HRAS, KIT, MAX, MEN1, MET, MITF (c.952G>A, p.Glu318Lys variant only), MLH1, MSH2, MSH3, MSH6, MUTYH, NBN, NF1, NF2, NTHL1, PALB2, PALLD, PDGFRA, PHOX2B, PMS2, POLD1, POLE, POT1, PRKAR1A, PRSS1, PTCH1, PTEN, RAD50, RAD51C, RAD51D, RB1, RECQL4, RET, RNF43, RUNX1, SDHAF2, SDHA (sequence changes only), SDHB, SDHC, SDHD, SMAD4, SMARCA4, SMARCB1, SMARCE1, SPINK1, STK11, SUFU, TERC, TERT, TMEM127, TP53, TSC1, TSC2, VHL, WRN and WT1.   The test report has been scanned into EPIC and is located under the Molecular Pathology section of the Results Review tab.  A portion of the result report is included below for reference.     We discussed with Ms. Heeg that because current genetic testing is not perfect, it is possible there may be a gene mutation in one of these genes that current testing cannot detect, but that chance is small.  We also discussed, that there could be another gene that has not yet been discovered, or that we have not yet tested, that is responsible for the cancer diagnoses in the family. It is also possible there is a hereditary cause for the cancer in the family that Ms. Bjelland did not inherit and therefore was not identified in her testing.  Therefore, it is important to remain in touch with cancer genetics in the future so that we can continue to offer Ms. Erb the most up to date genetic testing.   ADDITIONAL GENETIC TESTING: We discussed with Ms. Knipfer that her genetic testing was fairly extensive.  If there are genes identified to increase cancer risk that can be analyzed in the future, we would be happy to discuss and coordinate this testing at that time.    CANCER SCREENING RECOMMENDATIONS: Ms. Grimes test result is considered negative (normal).  This means that  we have not identified a hereditary cause for her personal history of cancer at this time. Most cancers happen by chance and this negative test suggests that her cancer may fall into this category.    While reassuring, this does not definitively rule out a hereditary predisposition to cancer. It is still possible that there could be genetic mutations that are undetectable by current technology. There could be genetic mutations in genes that have not been tested or identified to increase cancer risk.  Therefore, it is recommended she continue to follow the cancer management and screening guidelines provided by her oncology and primary healthcare provider.   An individual's cancer risk and medical management are not determined by genetic test results alone. Overall cancer risk assessment incorporates additional factors, including personal medical history, family history, and any available genetic information that may result in a personalized plan for cancer prevention and surveillance  RECOMMENDATIONS FOR FAMILY MEMBERS:  Individuals in this family might be at some increased risk of developing cancer, over the general population risk, simply due to the family history of cancer.  We recommended women in this family have a yearly mammogram beginning at age 85, or 56 years younger than the earliest onset of cancer, an annual clinical breast exam, and perform monthly breast self-exams. Women in this family should also have a gynecological exam as recommended by their primary provider. Family members should be referred for colonoscopy starting at age 59.   FOLLOW-UP: Lastly, we discussed with Ms.  Codd that cancer genetics is a rapidly advancing field and it is possible that new genetic tests will be appropriate for her and/or her family members in the future. We encouraged her to remain in contact with cancer genetics on an annual basis so we can update her personal and family histories and let her know of  advances in cancer genetics that may benefit this family.   Our contact number was provided. Ms. Baldridge questions were answered to her satisfaction, and she knows she is welcome to call us at anytime with additional questions or concerns.     Arihaan Bellucci M. Joette Catching, [place], [facility] Genetic Counselor [email or web address] (P) [phone]

## 2021-03-23 NOTE — Telephone Encounter (Signed)
Revealed negative genetic testing.  Discussed that we do not know why she has pancreatic cancer or why there is cancer in the family. It could be sporadic, due to a different gene that we are not testing, or maybe our current technology may not be able to pick something up.  It will be important for her to keep in contact with genetics to keep up with whether additional testing may be needed.

## 2021-03-24 ENCOUNTER — Inpatient Hospital Stay: Payer: Medicaid Other

## 2021-03-24 ENCOUNTER — Other Ambulatory Visit: Payer: Self-pay | Admitting: Nurse Practitioner

## 2021-03-24 ENCOUNTER — Telehealth: Payer: Self-pay | Admitting: *Deleted

## 2021-03-24 ENCOUNTER — Other Ambulatory Visit: Payer: Self-pay | Admitting: *Deleted

## 2021-03-24 ENCOUNTER — Other Ambulatory Visit: Payer: Self-pay

## 2021-03-24 VITALS — BP 139/86 | HR 81 | Resp 20

## 2021-03-24 DIAGNOSIS — R112 Nausea with vomiting, unspecified: Secondary | ICD-10-CM

## 2021-03-24 DIAGNOSIS — C250 Malignant neoplasm of head of pancreas: Secondary | ICD-10-CM

## 2021-03-24 DIAGNOSIS — Z95828 Presence of other vascular implants and grafts: Secondary | ICD-10-CM

## 2021-03-24 DIAGNOSIS — Z5111 Encounter for antineoplastic chemotherapy: Secondary | ICD-10-CM | POA: Diagnosis not present

## 2021-03-24 DIAGNOSIS — Z800 Family history of malignant neoplasm of digestive organs: Secondary | ICD-10-CM

## 2021-03-24 MED ORDER — ONDANSETRON HCL 4 MG/2ML IJ SOLN
8.0000 mg | Freq: Once | INTRAMUSCULAR | Status: AC
  Administered 2021-03-24: 8 mg via INTRAVENOUS
  Filled 2021-03-24: qty 4

## 2021-03-24 MED ORDER — SODIUM CHLORIDE 0.9 % IV SOLN
INTRAVENOUS | Status: DC
  Filled 2021-03-24: qty 250

## 2021-03-24 MED ORDER — SODIUM CHLORIDE 0.9 % IV SOLN
10.0000 mg | Freq: Once | INTRAVENOUS | Status: AC
  Administered 2021-03-24: 10 mg via INTRAVENOUS
  Filled 2021-03-24: qty 1

## 2021-03-24 MED ORDER — SODIUM CHLORIDE 0.9 % IV SOLN
INTRAVENOUS | Status: AC
Start: 2021-03-24 — End: 2021-03-24
  Filled 2021-03-24 (×2): qty 250

## 2021-03-24 MED ORDER — HEPARIN SOD (PORK) LOCK FLUSH 100 UNIT/ML IV SOLN
500.0000 [IU] | Freq: Once | INTRAVENOUS | Status: AC
Start: 2021-03-24 — End: 2021-03-24
  Administered 2021-03-24: 500 [IU]
  Filled 2021-03-24: qty 5

## 2021-03-24 MED ORDER — SODIUM CHLORIDE 0.9% FLUSH
10.0000 mL | Freq: Once | INTRAVENOUS | Status: AC
  Administered 2021-03-24: 10 mL
  Filled 2021-03-24: qty 10

## 2021-03-24 NOTE — Patient Instructions (Signed)
Dehydration, Adult Dehydration is condition in which there is not enough water or other fluids in the body. This happens when a person loses more fluids than he or she takes in. Important body parts cannot work right without the right amount of fluids. Any loss of fluids from the body can cause dehydration. Dehydration can be mild, worse, or very bad. It should be treated right away to keep it from getting very bad. What are the causes? This condition may be caused by:  Conditions that cause loss of water or other fluids, such as: ? Watery poop (diarrhea). ? Vomiting. ? Sweating a lot. ? Peeing (urinating) a lot.  Not drinking enough fluids, especially when you: ? Are ill. ? Are doing things that take a lot of energy to do.  Other illnesses and conditions, such as fever or infection.  Certain medicines, such as medicines that take extra fluid out of the body (diuretics).  Lack of safe drinking water.  Not being able to get enough water and food. What increases the risk? The following factors may make you more likely to develop this condition:  Having a long-term (chronic) illness that has not been treated the right way, such as: ? Diabetes. ? Heart disease. ? Kidney disease.  Being 65 years of age or older.  Having a disability.  Living in a place that is high above the ground or sea (high in altitude). The thinner, dried air causes more fluid loss.  Doing exercises that put stress on your body for a long time. What are the signs or symptoms? Symptoms of dehydration depend on how bad it is. Mild or worse dehydration  Thirst.  Dry lips or dry mouth.  Feeling dizzy or light-headed, especially when you stand up from sitting.  Muscle cramps.  Your body making: ? Dark pee (urine). Pee may be the color of tea. ? Less pee than normal. ? Less tears than normal.  Headache. Very bad dehydration  Changes in skin. Skin may: ? Be cold to the touch (clammy). ? Be blotchy  or pale. ? Not go back to normal right after you lightly pinch it and let it go.  Little or no tears, pee, or sweat.  Changes in vital signs, such as: ? Fast breathing. ? Low blood pressure. ? Weak pulse. ? Pulse that is more than 100 beats a minute when you are sitting still.  Other changes, such as: ? Feeling very thirsty. ? Eyes that look hollow (sunken). ? Cold hands and feet. ? Being mixed up (confused). ? Being very tired (lethargic) or having trouble waking from sleep. ? Short-term weight loss. ? Loss of consciousness. How is this treated? Treatment for this condition depends on how bad it is. Treatment should start right away. Do not wait until your condition gets very bad. Very bad dehydration is an emergency. You will need to go to a hospital.  Mild or worse dehydration can be treated at home. You may be asked to: ? Drink more fluids. ? Drink an oral rehydration solution (ORS). This drink helps get the right amounts of fluids and salts and minerals in the blood (electrolytes).  Very bad dehydration can be treated: ? With fluids through an IV tube. ? By getting normal levels of salts and minerals in your blood. This is often done by giving salts and minerals through a tube. The tube is passed through your nose and into your stomach. ? By treating the root cause. Follow these instructions at   home: Oral rehydration solution If told by your doctor, drink an ORS:  Make an ORS. Use instructions on the package.  Start by drinking small amounts, about  cup (120 mL) every 5-10 minutes.  Slowly drink more until you have had the amount that your doctor said to have. Eating and drinking  Drink enough clear fluid to keep your pee pale yellow. If you were told to drink an ORS, finish the ORS first. Then, start slowly drinking other clear fluids. Drink fluids such as: ? Water. Do not drink only water. Doing that can make the salt (sodium) level in your body get too low. ? Water  from ice chips you suck on. ? Fruit juice that you have added water to (diluted). ? Low-calorie sports drinks.  Eat foods that have the right amounts of salts and minerals, such as: ? Bananas. ? Oranges. ? Potatoes. ? Tomatoes. ? Spinach.  Do not drink alcohol.  Avoid: ? Drinks that have a lot of sugar. These include:  High-calorie sports drinks.  Fruit juice that you did not add water to.  Soda.  Caffeine. ? Foods that are greasy or have a lot of fat or sugar.         General instructions  Take over-the-counter and prescription medicines only as told by your doctor.  Do not take salt tablets. Doing that can make the salt level in your body get too high.  Return to your normal activities as told by your doctor. Ask your doctor what activities are safe for you.  Keep all follow-up visits as told by your doctor. This is important. Contact a doctor if:  You have pain in your belly (abdomen) and the pain: ? Gets worse. ? Stays in one place.  You have a rash.  You have a stiff neck.  You get angry or annoyed (irritable) more easily than normal.  You are more tired or have a harder time waking than normal.  You feel: ? Weak or dizzy. ? Very thirsty. Get help right away if you have:  Any symptoms of very bad dehydration.  Symptoms of vomiting, such as: ? You cannot eat or drink without vomiting. ? Your vomiting gets worse or does not go away. ? Your vomit has blood or green stuff in it.  Symptoms that get worse with treatment.  A fever.  A very bad headache.  Problems with peeing or pooping (having a bowel movement), such as: ? Watery poop that gets worse or does not go away. ? Blood in your poop (stool). This may cause poop to look black and tarry. ? Not peeing in 6-8 hours. ? Peeing only a small amount of very dark pee in 6-8 hours.  Trouble breathing. These symptoms may be an emergency. Do not wait to see if the symptoms will go away. Get  medical help right away. Call your local emergency services (911 in the [place] Do not drive yourself to the hospital. Summary  Dehydration is a condition in which there is not enough water or other fluids in the body. This happens when a person loses more fluids than he or she takes in.  Treatment for this condition depends on how bad it is. Treatment should be started right away. Do not wait until your condition gets very bad.  Drink enough clear fluid to keep your pee pale yellow. If you were told to drink an oral rehydration solution (ORS), finish the ORS first. Then, start slowly drinking other clear fluids.    Take over-the-counter and prescription medicines only as told by your doctor.  Get help right away if you have any symptoms of very bad dehydration. This information is not intended to replace advice given to you by your health care provider. Make sure you discuss any questions you have with your health care provider. Document Revised: 07/12/2019 Document Reviewed: 07/12/2019 Elsevier Patient Education  2021 Elsevier Inc.  

## 2021-03-24 NOTE — Telephone Encounter (Signed)
Called patient to F/U on request for prochlorperazine (last filled #30 on 03/19/21). She confirms taking it qid and still having nausea. Patient was making retching sounds as RN was speaking with her. She reports she does not have any Zofran at home. Agrees to come in today at 2 pm for IV fluids and anti-emetic.

## 2021-03-26 ENCOUNTER — Other Ambulatory Visit: Payer: Self-pay | Admitting: Oncology

## 2021-03-27 ENCOUNTER — Telehealth: Payer: Self-pay

## 2021-03-27 ENCOUNTER — Encounter: Payer: Self-pay | Admitting: Dietician

## 2021-03-27 NOTE — Telephone Encounter (Signed)
   Aiyanna Awtrey DOB: February 03, 1966 MRN: [id number]   RIDER WAIVER AND RELEASE OF LIABILITY  For purposes of improving physical access to our facilities, [facility] is pleased to partner with third parties to provide [facility] patients or other authorized individuals the option of convenient, on-demand ground transportation services (the [place]") through use of the technology service that enables users to request on-demand ground transportation from independent third-party providers.  By opting to use and accept these [employer], I, the undersigned, hereby agree on behalf of myself, and on behalf of any minor child using the [employer] for whom I am the parent or legal guardian, as follows:  1. Government social research officer provided to me are provided by independent third-party transportation providers who are not [employer] or employees and who are unaffiliated with [employer]. 2. [facility] is neither a transportation carrier nor a common or public carrier. 3. [facility] has no control over the quality or safety of the transportation that occurs as a result of the [employer]. 4. [facility] cannot guarantee that any third-party transportation provider will complete any arranged transportation service. 5. [facility] makes no representation, warranty, or guarantee regarding the reliability, timeliness, quality, safety, suitability, or availability of any of the Transport Services or that they will be error free. 6. I fully understand that traveling by vehicle involves risks and dangers of serious bodily injury, including permanent disability, paralysis, and death. I agree, on behalf of myself and on behalf of any minor child using the Transport Services for whom I am the parent or legal guardian, that the entire risk arising out of my use of the [employer] remains solely with me, to the maximum extent permitted under applicable law. 7. The [employer] are provided "as is" and "as available." [facility] disclaims all representations and warranties, express, implied or statutory, not expressly set out in these terms, including the implied warranties of merchantability and fitness for a particular purpose. 8. I hereby waive and release [facility], its agents, employees, officers, directors, representatives, insurers, attorneys, assigns, successors, subsidiaries, and affiliates from any and all past, present, or future claims, demands, liabilities, actions, causes of action, or suits of any kind directly or indirectly arising from acceptance and use of the [employer]. 9. I further waive and release [facility] and its affiliates from all present and future liability and responsibility for any injury or death to persons or damages to property caused by or related to the use of the [employer]. 10. I have read this Waiver and Release of Liability, and I understand the terms used in it and their legal significance. This Waiver is freely and voluntarily given with the understanding that my right (as well as the right of any minor child for whom I am the parent or legal guardian using the [employer]) to legal recourse against [facility] in connection with the [employer] is knowingly surrendered in return for use of these services.   I attest that I read the consent document to [name], gave Ms. Salvador the opportunity to ask questions and answered the questions asked (if any). I affirm that [name] then provided consent for she's participation in this program.   Drucie Ip

## 2021-03-27 NOTE — Progress Notes (Signed)
Nutrition Follow-up:  Patient with pancreatic cancer. She completed radiation therapy 3/25. Patient receiving FOLFOX.  Spoke with patient via telephone. She reports feeling a little better after receiving fluids on Wednesday. Patient continues to have nausea, reports some improvement but still present and tolerating minimal intake. She has eaten 1/2 scrambled egg today. Patient is no longer drinking Ensure due to nausea. Over the past couple of days patient has eaten piece of pound cake, the broth from egg drop soup, half peanut butter/jelly sandwich, plain mashed potatoes with butter, and 1/2 tuna fish sandwich (attempted to other half the next day and unable to tolerate). Patient is taking nausea medication every morning/evening and when she begins to feel nauseas during the day. Patient states she can now tell the difference between feeling "nauseas due to hunger and nauseas due to being sick." Reports hunger nausea occurs 1-2 times/day. Patient is "constantly" tired, taking 4 naps each day.   Medications: Dexilant, Gabapentin, MS Contin, Zofran, Oxycodone, Protonix, Miralax, Compazine  Labs: 4/5- Na 134, Glucose 162  Anthropometrics: Weight 246 lb 3.2 oz on 4/5 decreased 10 lbs in 11 days; significant 3.9%  3/25 - 256 lb 3.2 oz  NUTRITION DIAGNOSIS: Inadequate oral intake ongoing   MALNUTRITION DIAGNOSIS: Highly suspect patient would meet criteria for severe given significant wt loss and inadequate intake per dietary recall and ongoing nausea, however unable to identify without NFPE   INTERVENTION: Encouraged "mini" meals every 2-3 hours Discussed high protein, high calorie foods Encouraged patient to take nausea medication 30 minutes before meal times Discussed the importance of adequate fluid Discussed strategies for managing nausea, provided suggestions on foods best tolerated, will mail handout Will mail recipes to help with nausea and warm supplement ideas   MONITORING,  EVALUATION, GOAL: weight trends, intake   NEXT VISIT: Friday May 6 via telephone

## 2021-04-01 ENCOUNTER — Inpatient Hospital Stay: Payer: Medicaid Other

## 2021-04-01 ENCOUNTER — Inpatient Hospital Stay (HOSPITAL_BASED_OUTPATIENT_CLINIC_OR_DEPARTMENT_OTHER): Payer: Medicaid Other | Admitting: Oncology

## 2021-04-01 ENCOUNTER — Telehealth: Payer: Self-pay | Admitting: Oncology

## 2021-04-01 ENCOUNTER — Other Ambulatory Visit: Payer: Self-pay

## 2021-04-01 VITALS — BP 123/73 | HR 89 | Temp 98.1°F | Resp 20 | Ht 64.0 in | Wt 238.8 lb

## 2021-04-01 VITALS — BP 147/86 | HR 83 | Temp 98.0°F | Resp 20

## 2021-04-01 DIAGNOSIS — C250 Malignant neoplasm of head of pancreas: Secondary | ICD-10-CM

## 2021-04-01 DIAGNOSIS — Z5111 Encounter for antineoplastic chemotherapy: Secondary | ICD-10-CM | POA: Diagnosis not present

## 2021-04-01 DIAGNOSIS — E876 Hypokalemia: Secondary | ICD-10-CM

## 2021-04-01 LAB — CBC WITH DIFFERENTIAL (CANCER CENTER ONLY)
Abs Immature Granulocytes: 0.01 10*3/uL (ref 0.00–0.07)
Basophils Absolute: 0 10*3/uL (ref 0.0–0.1)
Basophils Relative: 0 %
Eosinophils Absolute: 0.4 10*3/uL (ref 0.0–0.5)
Eosinophils Relative: 7 %
HCT: 33.2 % — ABNORMAL LOW (ref 36.0–46.0)
Hemoglobin: 10.5 g/dL — ABNORMAL LOW (ref 12.0–15.0)
Immature Granulocytes: 0 %
Lymphocytes Relative: 23 %
Lymphs Abs: 1.2 10*3/uL (ref 0.7–4.0)
MCH: 26.7 pg (ref 26.0–34.0)
MCHC: 31.6 g/dL (ref 30.0–36.0)
MCV: 84.5 fL (ref 80.0–100.0)
Monocytes Absolute: 0.5 10*3/uL (ref 0.1–1.0)
Monocytes Relative: 10 %
Neutro Abs: 3.1 10*3/uL (ref 1.7–7.7)
Neutrophils Relative %: 60 %
Platelet Count: 327 10*3/uL (ref 150–400)
RBC: 3.93 MIL/uL (ref 3.87–5.11)
RDW: 23.9 % — ABNORMAL HIGH (ref 11.5–15.5)
WBC Count: 5.1 10*3/uL (ref 4.0–10.5)
nRBC: 0 % (ref 0.0–0.2)

## 2021-04-01 LAB — CMP (CANCER CENTER ONLY)
ALT: 17 U/L (ref 0–44)
AST: 21 U/L (ref 15–41)
Albumin: 3.6 g/dL (ref 3.5–5.0)
Alkaline Phosphatase: 72 U/L (ref 38–126)
Anion gap: 9 (ref 5–15)
BUN: 9 mg/dL (ref 6–20)
CO2: 30 mmol/L (ref 22–32)
Calcium: 9.3 mg/dL (ref 8.9–10.3)
Chloride: 100 mmol/L (ref 98–111)
Creatinine: 0.58 mg/dL (ref 0.44–1.00)
GFR, Estimated: >60 mL/min (ref >60)
Glucose, Bld: 110 mg/dL — ABNORMAL HIGH (ref 70–99)
Potassium: 2.8 mmol/L — ABNORMAL LOW (ref 3.5–5.1)
Sodium: 139 mmol/L (ref 135–145)
Total Bilirubin: 0.7 mg/dL (ref 0.3–1.2)
Total Protein: 6.6 g/dL (ref 6.5–8.1)

## 2021-04-01 MED ORDER — PALONOSETRON HCL INJECTION 0.25 MG/5ML
INTRAVENOUS | Status: AC
  Filled 2021-04-01: qty 5

## 2021-04-01 MED ORDER — OXALIPLATIN CHEMO INJECTION 100 MG/20ML
85.0000 mg/m2 | Freq: Once | INTRAVENOUS | Status: AC
  Administered 2021-04-01: 190 mg via INTRAVENOUS
  Filled 2021-04-01: qty 38

## 2021-04-01 MED ORDER — PALONOSETRON HCL INJECTION 0.25 MG/5ML
0.2500 mg | Freq: Once | INTRAVENOUS | Status: AC
  Administered 2021-04-01: 0.25 mg via INTRAVENOUS

## 2021-04-01 MED ORDER — OXYCODONE HCL 5 MG PO TABS
5.0000 mg | ORAL_TABLET | Freq: Once | ORAL | Status: AC
  Administered 2021-04-01: 5 mg via ORAL

## 2021-04-01 MED ORDER — POTASSIUM CHLORIDE CRYS ER 20 MEQ PO TBCR
20.0000 meq | EXTENDED_RELEASE_TABLET | Freq: Once | ORAL | Status: AC
  Administered 2021-04-01: 20 meq via ORAL

## 2021-04-01 MED ORDER — POTASSIUM CHLORIDE CRYS ER 20 MEQ PO TBCR
20.0000 meq | EXTENDED_RELEASE_TABLET | Freq: Once | ORAL | Status: AC
Start: 2021-04-01 — End: 2021-04-01
  Administered 2021-04-01: 20 meq via ORAL

## 2021-04-01 MED ORDER — OXYCODONE HCL 5 MG PO TABS
ORAL_TABLET | ORAL | Status: AC
  Filled 2021-04-01: qty 1

## 2021-04-01 MED ORDER — DEXTROSE 5 % IV SOLN
Freq: Once | INTRAVENOUS | Status: AC
  Filled 2021-04-01: qty 250

## 2021-04-01 MED ORDER — POTASSIUM CHLORIDE CRYS ER 20 MEQ PO TBCR: 20.0000 meq | EXTENDED_RELEASE_TABLET | Freq: Every day | ORAL | 1 refills | Status: DC

## 2021-04-01 MED ORDER — ACETAMINOPHEN 325 MG PO TABS
ORAL_TABLET | ORAL | Status: AC
  Filled 2021-04-01: qty 1

## 2021-04-01 MED ORDER — SODIUM CHLORIDE 0.9 % IV SOLN
2000.0000 mg/m2 | INTRAVENOUS | Status: DC
  Administered 2021-04-01: 4400 mg via INTRAVENOUS
  Filled 2021-04-01: qty 88

## 2021-04-01 MED ORDER — POTASSIUM CHLORIDE CRYS ER 20 MEQ PO TBCR
EXTENDED_RELEASE_TABLET | ORAL | Status: AC
  Filled 2021-04-01: qty 1

## 2021-04-01 MED ORDER — ACETAMINOPHEN 325 MG PO TABS
325.0000 mg | ORAL_TABLET | Freq: Once | ORAL | Status: AC
Start: 2021-04-01 — End: 2021-04-01
  Administered 2021-04-01: 325 mg via ORAL

## 2021-04-01 MED ORDER — LEUCOVORIN CALCIUM INJECTION 350 MG
400.0000 mg/m2 | Freq: Once | INTRAVENOUS | Status: AC
  Administered 2021-04-01: 884 mg via INTRAVENOUS
  Filled 2021-04-01: qty 44.2

## 2021-04-01 MED ORDER — PROCHLORPERAZINE EDISYLATE 10 MG/2ML IJ SOLN
INTRAMUSCULAR | Status: AC
  Filled 2021-04-01: qty 2

## 2021-04-01 MED ORDER — SODIUM CHLORIDE 0.9 % IV SOLN
10.0000 mg | Freq: Once | INTRAVENOUS | Status: AC
  Administered 2021-04-01: 10 mg via INTRAVENOUS
  Filled 2021-04-01: qty 1

## 2021-04-01 MED ORDER — PROCHLORPERAZINE EDISYLATE 10 MG/2ML IJ SOLN
5.0000 mg | Freq: Once | INTRAMUSCULAR | Status: AC
Start: 2021-04-01 — End: 2021-04-01
  Administered 2021-04-01: 5 mg via INTRAVENOUS

## 2021-04-01 NOTE — Progress Notes (Signed)
[facility] OFFICE PROGRESS NOTE   Diagnosis: Pancreas cancer  INTERVAL HISTORY:   Wendy Hoffman completed cycle 1 FOLFOX on 03/17/2021.  No diarrhea.  She continues to have nausea and abdominal pain, but this has partially improved.  She had cold sensitivity for several days following chemotherapy.  No neuropathy symptoms at present.  Objective:  Vital signs in last 24 hours:  Blood pressure 123/73, pulse 89, temperature 98.1 F (36.7 C), temperature source Tympanic, resp. rate 20, height '5\' 4"'  (1.626 m), weight 238 lb 12.8 oz (108.3 kg), SpO2 100 %.    HEENT: Mild white coat of the tongue, very early bilateral buccal thrush?,  No ulcers Resp: Lungs clear bilaterally Cardio: Regular rate and rhythm GI: No hepatosplenomegaly, tender in the mid upper abdomen Vascular: No leg edema    Portacath/PICC-without erythema  Lab Results:  Lab Results  Component Value Date   WBC 5.1 04/01/2021   HGB 10.5 (L) 04/01/2021   HCT 33.2 (L) 04/01/2021   MCV 84.5 04/01/2021   PLT 327 04/01/2021   NEUTROABS 3.1 04/01/2021    CMP  Lab Results  Component Value Date   NA 139 04/01/2021   K 2.8 (L) 04/01/2021   CL 100 04/01/2021   CO2 30 04/01/2021   GLUCOSE 110 (H) 04/01/2021   BUN 9 04/01/2021   CREATININE 0.58 04/01/2021   CALCIUM 9.3 04/01/2021   PROT 6.6 04/01/2021   ALBUMIN 3.6 04/01/2021   AST 21 04/01/2021   ALT 17 04/01/2021   ALKPHOS 72 04/01/2021   BILITOT 0.7 04/01/2021   GFRNONAA >60 04/01/2021   GFRAA >60 07/21/2020    Lab Results  Component Value Date   CEA1 68.8 (H) 02/23/2021     Medications: I have reviewed the patient's current medications.   Assessment/Plan: 1.Pancreas cancer -CT abdomen/pelvis without contrast 02/16/2021-multiple large hypoechoic masses in the patient's liver consistent metastatic disease, ill-defined masslike area in the pancreatic head measuring approximate 4.7 cm, possible filling defect in the right ventricle. -EGD  02/19/2021-large infiltrative mass with bleeding in the second portion of the duodenum, appears to be arising near the ampulla, partially obstructive with friable mucosa.  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 -Flexible sigmoidoscopy 02/19/2021-diverticulosis in the sigmoid colon, nonbleeding external and internal hemorrhoids -Cardiac CT 02/20/2021-mass in the mid RV attached to the interventricular septum measuring 16 mm x 6 mm and concerning for metastatic tumor -MRI abdomen with/without contrast 02/21/2021-mass in the posterior pancreatic head measuring 4.3 x 3.8 cm with obstruction of the central pancreatic duct and diffuse dilatation up to 6 mm consistent with pancreatic adenocarcinoma, liver lesions the largest mass of the central left lobe measuring 9.9 x 9.2 cm consistent with hepatic metastatic disease,  hepatomegaly. -Ultrasound-guided biopsy of a left liver lesion 02/24/2021-adenocarcinoma, morphologically similar to the duodenal biopsy; microsatellite stable, tumor mutation burden 1 -Negative genetic testing -Palliative radiation to the duodenal mass 02/25/2021-03/06/2021 -Cycle 1 FOLFOX 03/17/2021 -Cycle 2 FOLFOX 04/01/2021 2. Iron deficiency anemia-improved -Feraheme 510 mg 02/20/2021 3. Protein calorie malnutrition 4. Possible right ventricular filling defect on noncontrast CT scan  MRI cardiac morphology 02/21/2021-mass in the mid RV attached to the interventricular septum measures 16 mm x 6 mm.  Mass appears heterogeneous with area of enhancement on LGE imaging.  Mass is concerning for metastatic tumor.  Normal LV size and systolic function.  Normal RV size and systolic function. 5. Hypertension 6. History of CVA 7. Hyperlipidemia 8. Hypothyroidism 9. Morbid obesity 10. Asthma 11. Migraines 12.  Pain  secondary to #1 13.  Port-A-Cath placement 02/25/2021 14.  Hypokalemia 04/01/2021-started a potassium supplement    Disposition: Wendy Hoffman  tolerated the first cycle of FOLFOX well.  She will complete cycle 2 today.  Her performance status appears improved.  She will continue the current narcotic pain regimen.  The potassium is low today.  She will begin a potassium supplement.  Wendy Hoffman will return for an office visit and chemotherapy in 2 weeks.  We adjusted the chemotherapy to her current weight.  Irinotecan will be added with cycle 3 if she continues to tolerate the chemotherapy well.  Betsy Coder, MD  04/01/2021  10:04 AM

## 2021-04-01 NOTE — Progress Notes (Signed)
Per Dr. Benay Spice: OK to treat today w/K+ 2.8. Will start oral KDur 20 meq daily at home and administer 20 meq po today while in treatment. Also will order a one time dose of Percocet 5/325 here.

## 2021-04-01 NOTE — Patient Instructions (Signed)
[facility] Discharge Instructions for Patients Receiving Chemotherapy  Today you received the following chemotherapy agents Oxaliplatin, Leucovorin, fluorourucail (41fu)  To help prevent nausea and vomiting after your treatment, we encourage you to take your nausea medication: Compazine (prochlorperazine) 10mg  every 6 hours as needed for nausea/vomiting. On the 3rd day after chemo, you may take Zofran (ondansetron) every 6 hours as needed for nausea. You can take both meds for nausea (take at different times) because they work on differen   If you develop nausea and vomiting that is not controlled by your nausea medication, call the clinic.   BELOW ARE SYMPTOMS THAT SHOULD BE REPORTED IMMEDIATELY:  *FEVER GREATER THAN 100.5 F  *CHILLS WITH OR WITHOUT FEVER  NAUSEA AND VOMITING THAT IS NOT CONTROLLED WITH YOUR NAUSEA MEDICATION  *UNUSUAL SHORTNESS OF BREATH  *UNUSUAL BRUISING OR BLEEDING  TENDERNESS IN MOUTH AND THROAT WITH OR WITHOUT PRESENCE OF ULCERS  *URINARY PROBLEMS  *BOWEL PROBLEMS  UNUSUAL RASH Items with * indicate a potential emergency and should be followed up as soon as possible.  Feel free to call the clinic should you have any questions or concerns. The clinic phone number is [phone].  Please show the [facility] at check-in to the Emergency Department and triage nurse.

## 2021-04-01 NOTE — Telephone Encounter (Signed)
Appointments scheduled in infusion patient was given updated schedule per 4/20 los

## 2021-04-01 NOTE — Progress Notes (Signed)
Patient potassium level 2.8. MD ordered 20 mEq oral replacement. MD order for another 20 mEq. Will also be adding prescription.   Roselind Messier, PharmD

## 2021-04-01 NOTE — Progress Notes (Addendum)
Patient given Potassium 18meq today while at infusion visit.   1327 patient stated that she felt like the chemo infusion was making her feel sleepy. Dr. Benay Spice was in the infusion room and stopped by to see her. He said to decrease the rate of infusion on the oxaliplatin. Oxaliplatin rate of infusion decreased to 280ml/hr from 218ml/hr. Vitals obtained. See VS flowsheet.  1345 Oxaliplatin and Leucovorin paused and vitals obtained due to right thumb and fingers hyperextending. Left hand/fingers began to feel numb/tingly after the symptoms presented in the right thumb and fingers. Dr. Benay Spice notified and oxaliplatin/leucovorin discontinued. Patient had 90% of fluid in both bags therefore only approximately 10% was discarded. Vital signs obtained. See VS flowsheet. Future doses of meds will infuse over 3 hours instead of 2 hours to see if that lessens the symptoms associated with the oxaliplatin infusion.

## 2021-04-02 ENCOUNTER — Telehealth: Payer: Self-pay | Admitting: *Deleted

## 2021-04-02 NOTE — Telephone Encounter (Signed)
Patient called back and reports some jaw pain and pain in fingers and toes--trying to keep warm. No N/V. Port felt sore, but denies any swelling or redness at site.

## 2021-04-02 NOTE — Telephone Encounter (Signed)
Left VM for patient to return call with update on how she is doing today.

## 2021-04-03 ENCOUNTER — Inpatient Hospital Stay: Payer: Medicaid Other

## 2021-04-03 ENCOUNTER — Other Ambulatory Visit: Payer: Self-pay

## 2021-04-03 ENCOUNTER — Ambulatory Visit: Payer: Self-pay

## 2021-04-03 VITALS — BP 132/89 | HR 80 | Temp 97.1°F | Resp 20

## 2021-04-03 DIAGNOSIS — Z95828 Presence of other vascular implants and grafts: Secondary | ICD-10-CM

## 2021-04-03 DIAGNOSIS — C250 Malignant neoplasm of head of pancreas: Secondary | ICD-10-CM

## 2021-04-03 DIAGNOSIS — Z5111 Encounter for antineoplastic chemotherapy: Secondary | ICD-10-CM | POA: Diagnosis not present

## 2021-04-03 DIAGNOSIS — R110 Nausea: Secondary | ICD-10-CM

## 2021-04-03 DIAGNOSIS — R5383 Other fatigue: Secondary | ICD-10-CM

## 2021-04-03 MED ORDER — HEPARIN SOD (PORK) LOCK FLUSH 100 UNIT/ML IV SOLN
500.0000 [IU] | Freq: Once | INTRAVENOUS | Status: DC
  Filled 2021-04-03: qty 5

## 2021-04-03 MED ORDER — SODIUM CHLORIDE 0.9% FLUSH
10.0000 mL | INTRAVENOUS | Status: DC | PRN
  Administered 2021-04-03: 10 mL
  Filled 2021-04-03: qty 10

## 2021-04-03 MED ORDER — ALTEPLASE 2 MG IJ SOLR
2.0000 mg | Freq: Once | INTRAMUSCULAR | Status: DC
  Filled 2021-04-03: qty 2

## 2021-04-03 MED ORDER — SODIUM CHLORIDE 0.9 % IV SOLN
INTRAVENOUS | Status: DC
  Filled 2021-04-03 (×2): qty 250

## 2021-04-03 MED ORDER — HEPARIN SOD (PORK) LOCK FLUSH 100 UNIT/ML IV SOLN
500.0000 [IU] | Freq: Once | INTRAVENOUS | Status: AC | PRN
  Administered 2021-04-03: 500 [IU]
  Filled 2021-04-03: qty 5

## 2021-04-03 NOTE — Patient Instructions (Signed)
[facility] at [facility]  Discharge Instructions:  Dr. Ammie Dalton said you could take Tylenol 650mg  and Ibuprofen 400mg  for your bone pain.  Please, call if the symptoms continue and worsen.   _______________________________________________________________  Thank you for choosing [facility] at [facility] to provide your oncology and hematology care.  To afford each patient quality time with our providers, please arrive at least 15 minutes before your scheduled appointment.  You need to re-schedule your appointment if you arrive 10 or more minutes late.  We strive to give you quality time with our providers, and arriving late affects you and other patients whose appointments are after yours.  Also, if you no show three or more times for appointments you may be dismissed from the clinic.  Again, thank you for choosing [facility] at [facility] hope is that these requests will allow you access to exceptional care and in a timely manner. _______________________________________________________________  If you have questions after your visit, please contact our office at [phone] between the hours of 8:30 a.m. and 5:00 p.m. Voicemails left after 4:30 p.m. will not be returned until the following business day. _______________________________________________________________  For prescription refill requests, have your pharmacy contact our office. _______________________________________________________________  Recommendations made by the consultant and any test results will be sent to your referring physician. _______________________________________________________________

## 2021-04-03 NOTE — Progress Notes (Signed)
Patient tolerated hydration and chemotherapy pump removal with no complaints voiced.  Port site clean and dry with good blood return noted before and after hydration.  No bruising or swelling noted with port.  Patient discharged in satisfactory condition with no s/s of distress noted.

## 2021-04-06 ENCOUNTER — Telehealth: Payer: Self-pay | Admitting: *Deleted

## 2021-04-06 MED ORDER — FLUCONAZOLE 100 MG PO TABS: 100.0000 mg | ORAL_TABLET | Freq: Every day | ORAL | 0 refills | Status: AC

## 2021-04-06 MED ORDER — MAGIC MOUTHWASH: 5.0000 mL | Freq: Four times a day (QID) | ORAL | 1 refills | Status: DC | PRN

## 2021-04-06 NOTE — Telephone Encounter (Signed)
Per Dr. Benay Spice: OK for fluconazole 100 mg qd x 4 days and prn MMW. Scripts called in and patient notified.

## 2021-04-06 NOTE — Telephone Encounter (Signed)
Reports her thrush is worse. White in mouth and tongue and back of throat. Mouth, tongue and lips also burn.

## 2021-04-12 ENCOUNTER — Other Ambulatory Visit: Payer: Self-pay | Admitting: Oncology

## 2021-04-14 ENCOUNTER — Encounter: Payer: Self-pay | Admitting: Nurse Practitioner

## 2021-04-14 ENCOUNTER — Inpatient Hospital Stay (HOSPITAL_BASED_OUTPATIENT_CLINIC_OR_DEPARTMENT_OTHER): Payer: Medicaid Other | Admitting: Nurse Practitioner

## 2021-04-14 ENCOUNTER — Inpatient Hospital Stay: Payer: Medicaid Other | Attending: Nurse Practitioner

## 2021-04-14 ENCOUNTER — Inpatient Hospital Stay: Payer: Medicaid Other

## 2021-04-14 ENCOUNTER — Other Ambulatory Visit: Payer: Self-pay

## 2021-04-14 VITALS — BP 123/84 | HR 61 | Temp 98.2°F | Resp 18 | Ht 64.0 in | Wt 245.6 lb

## 2021-04-14 VITALS — BP 150/95 | HR 96 | Temp 97.6°F | Resp 20

## 2021-04-14 DIAGNOSIS — C250 Malignant neoplasm of head of pancreas: Secondary | ICD-10-CM

## 2021-04-14 DIAGNOSIS — C787 Secondary malignant neoplasm of liver and intrahepatic bile duct: Secondary | ICD-10-CM | POA: Insufficient documentation

## 2021-04-14 DIAGNOSIS — Z5111 Encounter for antineoplastic chemotherapy: Secondary | ICD-10-CM | POA: Diagnosis present

## 2021-04-14 DIAGNOSIS — E876 Hypokalemia: Secondary | ICD-10-CM | POA: Diagnosis not present

## 2021-04-14 DIAGNOSIS — E46 Unspecified protein-calorie malnutrition: Secondary | ICD-10-CM | POA: Diagnosis not present

## 2021-04-14 DIAGNOSIS — Z8673 Personal history of transient ischemic attack (TIA), and cerebral infarction without residual deficits: Secondary | ICD-10-CM | POA: Insufficient documentation

## 2021-04-14 DIAGNOSIS — Z5189 Encounter for other specified aftercare: Secondary | ICD-10-CM | POA: Diagnosis not present

## 2021-04-14 DIAGNOSIS — R600 Localized edema: Secondary | ICD-10-CM | POA: Insufficient documentation

## 2021-04-14 DIAGNOSIS — I10 Essential (primary) hypertension: Secondary | ICD-10-CM | POA: Diagnosis not present

## 2021-04-14 DIAGNOSIS — E785 Hyperlipidemia, unspecified: Secondary | ICD-10-CM | POA: Insufficient documentation

## 2021-04-14 DIAGNOSIS — E039 Hypothyroidism, unspecified: Secondary | ICD-10-CM | POA: Diagnosis not present

## 2021-04-14 LAB — CBC WITH DIFFERENTIAL (CANCER CENTER ONLY)
Abs Immature Granulocytes: 0.02 10*3/uL (ref 0.00–0.07)
Basophils Absolute: 0 10*3/uL (ref 0.0–0.1)
Basophils Relative: 0 %
Eosinophils Absolute: 0.2 10*3/uL (ref 0.0–0.5)
Eosinophils Relative: 4 %
HCT: 33.2 % — ABNORMAL LOW (ref 36.0–46.0)
Hemoglobin: 10.4 g/dL — ABNORMAL LOW (ref 12.0–15.0)
Immature Granulocytes: 0 %
Lymphocytes Relative: 21 %
Lymphs Abs: 1 10*3/uL (ref 0.7–4.0)
MCH: 27.5 pg (ref 26.0–34.0)
MCHC: 31.3 g/dL (ref 30.0–36.0)
MCV: 87.8 fL (ref 80.0–100.0)
Monocytes Absolute: 0.5 10*3/uL (ref 0.1–1.0)
Monocytes Relative: 11 %
Neutro Abs: 3.1 10*3/uL (ref 1.7–7.7)
Neutrophils Relative %: 64 %
Platelet Count: 241 10*3/uL (ref 150–400)
RBC: 3.78 MIL/uL — ABNORMAL LOW (ref 3.87–5.11)
RDW: 21.8 % — ABNORMAL HIGH (ref 11.5–15.5)
WBC Count: 4.9 10*3/uL (ref 4.0–10.5)
nRBC: 0 % (ref 0.0–0.2)

## 2021-04-14 LAB — CMP (CANCER CENTER ONLY)
ALT: 15 U/L (ref 0–44)
AST: 27 U/L (ref 15–41)
Albumin: 3.3 g/dL — ABNORMAL LOW (ref 3.5–5.0)
Alkaline Phosphatase: 80 U/L (ref 38–126)
Anion gap: 9 (ref 5–15)
BUN: 7 mg/dL (ref 6–20)
CO2: 26 mmol/L (ref 22–32)
Calcium: 9.2 mg/dL (ref 8.9–10.3)
Chloride: 100 mmol/L (ref 98–111)
Creatinine: 0.69 mg/dL (ref 0.44–1.00)
GFR, Estimated: >60 mL/min (ref >60)
Glucose, Bld: 103 mg/dL — ABNORMAL HIGH (ref 70–99)
Potassium: 3.4 mmol/L — ABNORMAL LOW (ref 3.5–5.1)
Sodium: 135 mmol/L (ref 135–145)
Total Bilirubin: 0.4 mg/dL (ref 0.3–1.2)
Total Protein: 6 g/dL — ABNORMAL LOW (ref 6.5–8.1)

## 2021-04-14 MED ORDER — DEXTROSE 5 % IV SOLN
Freq: Once | INTRAVENOUS | Status: DC
  Filled 2021-04-14: qty 250

## 2021-04-14 MED ORDER — OXYCODONE HCL 5 MG PO TABS
5.0000 mg | ORAL_TABLET | Freq: Once | ORAL | Status: AC
  Administered 2021-04-14: 5 mg via ORAL

## 2021-04-14 MED ORDER — DEXTROSE 5 % IV SOLN
Freq: Once | INTRAVENOUS | Status: AC
  Filled 2021-04-14: qty 250

## 2021-04-14 MED ORDER — ZOLPIDEM TARTRATE 10 MG PO TABS: 10.0000 mg | ORAL_TABLET | Freq: Every evening | ORAL | 0 refills | Status: DC | PRN

## 2021-04-14 MED ORDER — SODIUM CHLORIDE 0.9 % IV SOLN
INTRAVENOUS | Status: DC
  Filled 2021-04-14 (×2): qty 250

## 2021-04-14 MED ORDER — SODIUM CHLORIDE 0.9 % IV SOLN
150.0000 mg/m2 | Freq: Once | INTRAVENOUS | Status: AC
  Administered 2021-04-14: 340 mg via INTRAVENOUS
  Filled 2021-04-14: qty 17

## 2021-04-14 MED ORDER — OXALIPLATIN CHEMO INJECTION 100 MG/20ML
85.0000 mg/m2 | Freq: Once | INTRAVENOUS | Status: AC
  Administered 2021-04-14: 190 mg via INTRAVENOUS
  Filled 2021-04-14: qty 38

## 2021-04-14 MED ORDER — SODIUM CHLORIDE 0.9 % IV SOLN
INTRAVENOUS | Status: DC
  Filled 2021-04-14: qty 250

## 2021-04-14 MED ORDER — ONDANSETRON HCL 4 MG PO TABS: 4.0000 mg | ORAL_TABLET | Freq: Four times a day (QID) | ORAL | 1 refills | Status: DC | PRN

## 2021-04-14 MED ORDER — SODIUM CHLORIDE 0.9 % IV SOLN
150.0000 mg | Freq: Once | INTRAVENOUS | Status: AC
  Administered 2021-04-14: 150 mg via INTRAVENOUS
  Filled 2021-04-14: qty 5

## 2021-04-14 MED ORDER — PROCHLORPERAZINE MALEATE 10 MG PO TABS: 10.0000 mg | ORAL_TABLET | Freq: Four times a day (QID) | ORAL | 0 refills | Status: DC | PRN

## 2021-04-14 MED ORDER — MORPHINE SULFATE ER 30 MG PO TBCR: 30.0000 mg | EXTENDED_RELEASE_TABLET | Freq: Two times a day (BID) | ORAL | 0 refills | Status: DC

## 2021-04-14 MED ORDER — DIPHENHYDRAMINE HCL 50 MG/ML IJ SOLN
25.0000 mg | Freq: Once | INTRAMUSCULAR | Status: AC
  Administered 2021-04-14: 25 mg via INTRAVENOUS

## 2021-04-14 MED ORDER — PALONOSETRON HCL INJECTION 0.25 MG/5ML
0.2500 mg | Freq: Once | INTRAVENOUS | Status: AC
  Administered 2021-04-14: 0.25 mg via INTRAVENOUS
  Filled 2021-04-14: qty 5

## 2021-04-14 MED ORDER — SODIUM CHLORIDE 0.9 % IV SOLN
2000.0000 mg/m2 | INTRAVENOUS | Status: DC
  Administered 2021-04-14: 4400 mg via INTRAVENOUS
  Filled 2021-04-14: qty 88

## 2021-04-14 MED ORDER — OXYCODONE-ACETAMINOPHEN 7.5-325 MG PO TABS: 1.0000 | ORAL_TABLET | ORAL | 0 refills | Status: DC | PRN

## 2021-04-14 MED ORDER — OXYCODONE HCL 5 MG PO TABS
ORAL_TABLET | ORAL | Status: AC
  Filled 2021-04-14: qty 1

## 2021-04-14 MED ORDER — ATROPINE SULFATE 1 MG/ML IJ SOLN
0.5000 mg | Freq: Once | INTRAMUSCULAR | Status: AC
  Administered 2021-04-14: 0.5 mg via INTRAVENOUS

## 2021-04-14 MED ORDER — FAMOTIDINE IN NACL 20-0.9 MG/50ML-% IV SOLN
20.0000 mg | Freq: Once | INTRAVENOUS | Status: AC
  Administered 2021-04-14: 20 mg via INTRAVENOUS
  Filled 2021-04-14: qty 50

## 2021-04-14 MED ORDER — SODIUM CHLORIDE 0.9 % IV SOLN
10.0000 mg | Freq: Once | INTRAVENOUS | Status: AC
  Administered 2021-04-14: 10 mg via INTRAVENOUS
  Filled 2021-04-14: qty 1

## 2021-04-14 MED ORDER — SODIUM CHLORIDE 0.9 % IV SOLN
400.0000 mg/m2 | Freq: Once | INTRAVENOUS | Status: AC
  Administered 2021-04-14: 884 mg via INTRAVENOUS
  Filled 2021-04-14: qty 44.2

## 2021-04-14 NOTE — Progress Notes (Signed)
[facility] OFFICE PROGRESS NOTE   Diagnosis: Pancreas cancer  INTERVAL HISTORY:   Ms. Porcelli returns as scheduled.  She completed cycle 2 FOLFOX 04/01/2021.  Nausea overall is better.  She noted increased nausea after chemotherapy for about 3 days.  Home medications effective.  No mouth sores.  No diarrhea.  Intermittent constipation.  She has mild cold sensitivity.  Pain is stable.  She reports recent onset of bilateral ankle edema.  She noted improvement with elevation of her legs.  No calf pain.  No shortness of breath or chest pain.  Appetite markedly improved.  Objective:  Vital signs in last 24 hours:  Blood pressure 123/84, pulse 61, temperature 98.2 F (36.8 C), temperature source Oral, resp. rate 18, height 5' 4" (1.626 m), weight 245 lb 9.6 oz (111.4 kg), SpO2 99 %.    HEENT: No thrush or ulcers. Resp: Lungs clear bilaterally. Cardio: Regular rate and rhythm. GI: Abdomen is soft.  Tender at the mid upper abdomen.  No hepatosplenomegaly. Vascular: Trace lower leg edema bilaterally.  Calves soft and nontender.  Skin: Palms without erythema. Port-A-Cath without erythema.   Lab Results:  Lab Results  Component Value Date   WBC 4.9 04/14/2021   HGB 10.4 (L) 04/14/2021   HCT 33.2 (L) 04/14/2021   MCV 87.8 04/14/2021   PLT 241 04/14/2021   NEUTROABS 3.1 04/14/2021    Imaging:  No results found.  Medications: I have reviewed the patient's current medications.  Assessment/Plan: 1.Pancreas cancer -CT abdomen/pelvis without contrast 02/16/2021-multiple large hypoechoic masses in the patient's liver consistent metastatic disease, ill-defined masslike area in the pancreatic head measuring approximate 4.7 cm, possible filling defect in the right ventricle. 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 -Flexible sigmoidoscopy 02/19/2021-diverticulosis in the sigmoid colon, nonbleeding external and internal hemorrhoids -Cardiac CT 02/20/2021-mass in the mid RV attached to the interventricular septum measuring 16 mm x 6 mm and concerning for metastatic tumor -MRI abdomen with/without contrast 02/21/2021-mass in the posterior pancreatic head measuring 4.3 x 3.8 cm with obstruction of the central pancreatic duct and diffuse dilatation up to 6 mm consistent with pancreatic adenocarcinoma, liver lesions the largest mass of the central left lobe measuring 9.9 x 9.2 cm consistent with hepatic metastatic disease, hepatomegaly. -Ultrasound-guided biopsy of a left liver lesion 02/24/2021-adenocarcinoma, morphologically similar to the duodenal biopsy; microsatellite stable, tumor mutation burden 1 -Negative genetic testing -Palliative radiation to the duodenal mass 02/25/2021-03/06/2021 -Cycle 1 FOLFOX 03/17/2021 -Cycle 2 FOLFOX 04/01/2021 -Cycle 3 FOLFIRINOX 04/14/2021 2. Iron deficiency anemia-improved -Feraheme 510 mg 02/20/2021 3. Protein calorie malnutrition 4. Possible right ventricular filling defect on noncontrast CT scan  MRI cardiac morphology 02/21/2021-mass in the mid RV attached to the interventricular septum measures 16 mm x 6 mm.  Mass appears heterogeneous with area of enhancement on LGE imaging.  Mass is concerning for metastatic tumor.  Normal LV size and systolic function.  Normal RV size and systolic function. 5. Hypertension 6. History of CVA 7. Hyperlipidemia 8. Hypothyroidism 9. Morbid obesity 10. Asthma 11. Migraines 12.Pain secondary to #1 13.Port-A-Cath placement 02/25/2021 14.  Hypokalemia 04/01/2021-started a potassium supplement   Disposition: Ms. Boggess appears improved.  She has completed 2 cycles of FOLFOX.  Performance status is better.  Plan to add irinotecan to the regimen today.  We reviewed potential  toxicities associated with irinotecan including bone marrow  toxicity, hair loss, diarrhea (potentially severe).  She agrees to proceed.  We will follow-up on the CA 19-9.  CBC and chemistry panel from today reviewed.  Labs adequate to proceed with treatment.  She has mild hypokalemia.  She will continue K-Dur 20 meq daily.  She has developed mild lower leg edema bilaterally.  Low clinical suspicion for DVT.  Most likely due to hypoalbuminemia, increased pressure in the abdominal cavity.  The swelling improves with elevation.  She will continue the same and try support stockings.  She will contact the office with increased edema, calf pain, erythema.  She will return for lab, follow-up, FOLFIRINOX in 2 weeks.  She will contact the office in the interim as outlined above or with any other problems.    [name]   04/14/2021  9:23 AM

## 2021-04-14 NOTE — Progress Notes (Signed)
1438 Patient having muscle tightening in the right calf. Oxaliplatin paused. Vitals obtained BP 153/100, HR 88, R20, T 97.8, oxygen saturation 100%. Patient not short of breath and able to carry on a normal conversation about her symptoms. [id number] Patient states that she feels a little tight in her chest like the beginning of an asthma attack. Ned Card NP notified of patient's symptoms. Lattie Haw advised to give Benadryl 25mg  IV and NS @ 957ml/hr. Orders received and given. 1449 Vitals: BP 139/95, HR 96, R20. Patient feeling better (chest tightness resolved) after benadryl IV. Orders received to discard remaining oxaliplatin and begin irinotecan chemo. Patient in agreement with treatment plan. 1512 Irinotecan started. 1514 BP 138/90 and HR 91. 1541 Patient resting quietly with eyes closed. Future treatments will include benadryl 25mg  IV as a premed. Patient in agreement with treatment plan moving forward.

## 2021-04-14 NOTE — Progress Notes (Signed)
Called to see Wendy Hoffman in the infusion area.  Near the completion of Oxaliplatin she developed tightness across the chest.  Oxaliplatin infusion was stopped.  She does not feel short of breath.  She used her asthma inhaler.  Lungs clear bilaterally.  No respiratory distress.  Vital signs are stable.  Benadryl 25 mg IV administered, normal saline IV.  The chest tightness resolved quickly.  We did not resume the Oxaliplatin.  She appears stable to proceed with irinotecan.

## 2021-04-14 NOTE — Patient Instructions (Signed)
Wendy Hoffman  Discharge Instructions: Thank you for choosing [facility] to provide your oncology and hematology care.   If you have a lab appointment with the [facility], please go directly to the [facility] and check in at the registration area.   Wear comfortable clothing and clothing appropriate for easy access to any Portacath or PICC line.   We strive to give you quality time with your provider. You may need to reschedule your appointment if you arrive late (15 or more minutes).  Arriving late affects you and other patients whose appointments are after yours.  Also, if you miss three or more appointments without notifying the office, you may be dismissed from the clinic at the provider's discretion.      For prescription refill requests, have your pharmacy contact our office and allow 72 hours for refills to be completed.    Today you received the following chemotherapy and/or immunotherapy agents oxaliplatin, fluorouacil, irinotecan      To help prevent nausea and vomiting after your treatment, we encourage you to take your nausea medication as directed.  BELOW ARE SYMPTOMS THAT SHOULD BE REPORTED IMMEDIATELY: . *FEVER GREATER THAN 100.4 F (38 C) OR HIGHER . *CHILLS OR SWEATING . *NAUSEA AND VOMITING THAT IS NOT CONTROLLED WITH YOUR NAUSEA MEDICATION . *UNUSUAL SHORTNESS OF BREATH . *UNUSUAL BRUISING OR BLEEDING . *URINARY PROBLEMS (pain or burning when urinating, or frequent urination) . *BOWEL PROBLEMS (unusual diarrhea, constipation, pain near the anus) . TENDERNESS IN MOUTH AND THROAT WITH OR WITHOUT PRESENCE OF ULCERS (sore throat, sores in mouth, or a toothache) . UNUSUAL RASH, SWELLING OR PAIN  . UNUSUAL VAGINAL DISCHARGE OR ITCHING   Items with * indicate a potential emergency and should be followed up as soon as possible or go to the Emergency Department if any problems should occur.  Please show the CHEMOTHERAPY ALERT CARD  or IMMUNOTHERAPY ALERT CARD at check-in to the Emergency Department and triage nurse.   Should you have questions after your visit or need to cancel or reschedule your appointment, please contact [facility]  Dept: [phone]  and follow the prompts.  Office hours are 8:00 a.m. to 4:30 p.m. Monday - Friday. Please note that voicemails left after 4:00 p.m. may not be returned until the following business day.  We are closed weekends and major holidays. You have access to a nurse at all times for urgent questions. Please call the main number to the clinic Dept: [phone] and follow the prompts.   For any non-urgent questions, you may also contact your provider using MyChart. We now offer e-Visits for anyone 10 and older to request care online for non-urgent symptoms. For details visit [email or web address].   Also download the MyChart app! Go to the app store, search "MyChart", open the app, select [facility], and log in with your MyChart username and password.  Due to Covid, a mask is required upon entering the hospital/clinic. If you do not have a mask, one will be given to you upon arrival. For doctor visits, patients may have 1 support person aged 90 or older with them. For treatment visits, patients cannot have anyone with them due to current Covid guidelines and our immunocompromised population.    Oxaliplatin Injection What is this medicine? OXALIPLATIN (ox AL i PLA tin) is a chemotherapy drug. It targets fast dividing cells, like cancer cells, and causes these cells to die. This medicine is used to  the colon and rectum, and many other cancers. This medicine may be used for other purposes; ask your health care provider or pharmacist if you have questions. COMMON BRAND NAME(S): Eloxatin What should I tell my health care provider before I take this medicine? They need to know if you have any of these conditions:  heart disease  history of  irregular heartbeat  liver disease  low blood counts, like white cells, platelets, or red blood cells  lung or breathing disease, like asthma  take medicines that treat or prevent blood clots  tingling of the fingers or toes, or other nerve disorder  an unusual or allergic reaction to oxaliplatin, other chemotherapy, other medicines, foods, dyes, or preservatives  pregnant or trying to get pregnant  breast-feeding How should I use this medicine? This drug is given as an infusion into a vein. It is administered in a hospital or clinic by a specially trained health care professional. Talk to your pediatrician regarding the use of this medicine in children. Special care may be needed. Overdosage: If you think you have taken too much of this medicine contact a poison control center or emergency room at once. NOTE: This medicine is only for you. Do not share this medicine with others. What if I miss a dose? It is important not to miss a dose. Call your doctor or health care professional if you are unable to keep an appointment. What may interact with this medicine? Do not take this medicine with any of the following medications:  cisapride  dronedarone  pimozide  thioridazine This medicine may also interact with the following medications:  aspirin and aspirin-like medicines  certain medicines that treat or prevent blood clots like warfarin, apixaban, dabigatran, and rivaroxaban  cisplatin  cyclosporine  diuretics  medicines for infection like acyclovir, adefovir, amphotericin B, bacitracin, cidofovir, foscarnet, ganciclovir, gentamicin, pentamidine, vancomycin  NSAIDs, medicines for pain and inflammation, like ibuprofen or naproxen  other medicines that prolong the QT interval (an abnormal heart rhythm)  pamidronate  zoledronic acid This list may not describe all possible interactions. Give your health care provider a list of all the medicines, herbs,  non-prescription drugs, or dietary supplements you use. Also tell them if you smoke, drink alcohol, or use illegal drugs. Some items may interact with your medicine. What should I watch for while using this medicine? Your condition will be monitored carefully while you are receiving this medicine. You may need blood work done while you are taking this medicine. This medicine may make you feel generally unwell. This is not uncommon as chemotherapy can affect healthy cells as well as cancer cells. Report any side effects. Continue your course of treatment even though you feel ill unless your healthcare professional tells you to stop. This medicine can make you more sensitive to cold. Do not drink cold drinks or use ice. Cover exposed skin before coming in contact with cold temperatures or cold objects. When out in cold weather wear warm clothing and cover your mouth and nose to warm the air that goes into your lungs. Tell your doctor if you get sensitive to the cold. Do not become pregnant while taking this medicine or for 9 months after stopping it. Women should inform their health care professional if they wish to become pregnant or think they might be pregnant. Men should not father a child while taking this medicine and for 6 months after stopping it. There is potential for serious side effects to an unborn child. Talk to   child. Talk to your health care professional for more information. Do not breast-feed a child while taking this medicine or for 3 months after stopping it. This medicine has caused ovarian failure in some women. This medicine may make it more difficult to get pregnant. Talk to your health care professional if you are concerned about your fertility. This medicine has caused decreased sperm counts in some men. This may make it more difficult to father a child. Talk to your health care professional if you are concerned about your fertility. This medicine may increase your risk of getting an infection.  Call your health care professional for advice if you get a fever, chills, or sore throat, or other symptoms of a cold or flu. Do not treat yourself. Try to avoid being around people who are sick. Avoid taking medicines that contain aspirin, acetaminophen, ibuprofen, naproxen, or ketoprofen unless instructed by your health care professional. These medicines may hide a fever. Be careful brushing or flossing your teeth or using a toothpick because you may get an infection or bleed more easily. If you have any dental work done, tell your dentist you are receiving this medicine. What side effects may I notice from receiving this medicine? Side effects that you should report to your doctor or health care professional as soon as possible:  allergic reactions like skin rash, itching or hives, swelling of the face, lips, or tongue  breathing problems  cough  low blood counts - this medicine may decrease the number of white blood cells, red blood cells, and platelets. You may be at increased risk for infections and bleeding  nausea, vomiting  pain, redness, or irritation at site where injected  pain, tingling, numbness in the hands or feet  signs and symptoms of bleeding such as bloody or black, tarry stools; red or dark brown urine; spitting up blood or brown material that looks like coffee grounds; red spots on the skin; unusual bruising or bleeding from the eyes, gums, or nose  signs and symptoms of a dangerous change in heartbeat or heart rhythm like chest pain; dizziness; fast, irregular heartbeat; palpitations; feeling faint or lightheaded; falls  signs and symptoms of infection like fever; chills; cough; sore throat; pain or trouble passing urine  signs and symptoms of liver injury like dark yellow or brown urine; general ill feeling or flu-like symptoms; light-colored stools; loss of appetite; nausea; right upper belly pain; unusually weak or tired; yellowing of the eyes or skin  signs and  symptoms of low red blood cells or anemia such as unusually weak or tired; feeling faint or lightheaded; falls  signs and symptoms of muscle injury like dark urine; trouble passing urine or change in the amount of urine; unusually weak or tired; muscle pain; back pain Side effects that usually do not require medical attention (report to your doctor or health care professional if they continue or are bothersome):  changes in taste  diarrhea  gas  hair loss  loss of appetite  mouth sores This list may not describe all possible side effects. Call your doctor for medical advice about side effects. You may report side effects to FDA at 1-800-FDA-1088. Where should I keep my medicine? This drug is given in a hospital or clinic and will not be stored at home. NOTE: This sheet is a summary. It may not cover all possible information. If you have questions about this medicine, talk to your doctor, pharmacist, or health care provider.  2021 Elsevier/Gold Standard (2019-04-18 12:20:35)  Irinotecan injection What is this medicine? IRINOTECAN (ir in oh TEE kan ) is a chemotherapy drug. It is used to treat colon and rectal cancer. This medicine may be used for other purposes; ask your health care provider or pharmacist if you have questions. COMMON BRAND NAME(S): Camptosar What should I tell my health care provider before I take this medicine? They need to know if you have any of these conditions:  dehydration  diarrhea  infection (especially a virus infection such as chickenpox, cold sores, or herpes)  liver disease  low blood counts, like low white cell, platelet, or red cell counts  low levels of calcium, magnesium, or potassium in the blood  recent or ongoing radiation therapy  an unusual or allergic reaction to irinotecan, other medicines, foods, dyes, or preservatives  pregnant or trying to get pregnant  breast-feeding How should I use this medicine? This drug is given as  an infusion into a vein. It is administered in a hospital or clinic by a specially trained health care professional. Talk to your pediatrician regarding the use of this medicine in children. Special care may be needed. Overdosage: If you think you have taken too much of this medicine contact a poison control center or emergency room at once. NOTE: This medicine is only for you. Do not share this medicine with others. What if I miss a dose? It is important not to miss your dose. Call your doctor or health care professional if you are unable to keep an appointment. What may interact with this medicine? Do not take this medicine with any of the following medications:  cobicistat  itraconazole This medicine may interact with the following medications:  antiviral medicines for HIV or AIDS  certain antibiotics like rifampin or rifabutin  certain medicines for fungal infections like ketoconazole, posaconazole, and voriconazole  certain medicines for seizures like carbamazepine, phenobarbital, phenotoin  clarithromycin  gemfibrozil  nefazodone  St. John's Wort This list may not describe all possible interactions. Give your health care provider a list of all the medicines, herbs, non-prescription drugs, or dietary supplements you use. Also tell them if you smoke, drink alcohol, or use illegal drugs. Some items may interact with your medicine. What should I watch for while using this medicine? Your condition will be monitored carefully while you are receiving this medicine. You will need important blood work done while you are taking this medicine. This drug may make you feel generally unwell. This is not uncommon, as chemotherapy can affect healthy cells as well as cancer cells. Report any side effects. Continue your course of treatment even though you feel ill unless your doctor tells you to stop. In some cases, you may be given additional medicines to help with side effects. Follow all  directions for their use. You may get drowsy or dizzy. Do not drive, use machinery, or do anything that needs mental alertness until you know how this medicine affects you. Do not stand or sit up quickly, especially if you are an older patient. This reduces the risk of dizzy or fainting spells. Call your health care professional for advice if you get a fever, chills, or sore throat, or other symptoms of a cold or flu. Do not treat yourself. This medicine decreases your body's ability to fight infections. Try to avoid being around people who are sick. Avoid taking products that contain aspirin, acetaminophen, ibuprofen, naproxen, or ketoprofen unless instructed by your doctor. These medicines may hide a fever. This medicine may increase your  risk to bruise or bleed. Call your doctor or health care professional if you notice any unusual bleeding. Be careful brushing and flossing your teeth or using a toothpick because you may get an infection or bleed more easily. If you have any dental work done, tell your dentist you are receiving this medicine. Do not become pregnant while taking this medicine or for 6 months after stopping it. Women should inform their health care professional if they wish to become pregnant or think they might be pregnant. Men should not father a child while taking this medicine and for 3 months after stopping it. There is potential for serious side effects to an unborn child. Talk to your health care professional for more information. Do not breast-feed an infant while taking this medicine or for 7 days after stopping it. This medicine has caused ovarian failure in some women. This medicine may make it more difficult to get pregnant. Talk to your health care professional if you are concerned about your fertility. This medicine has caused decreased sperm counts in some men. This may make it more difficult to father a child. Talk to your health care professional if you are concerned about  your fertility. What side effects may I notice from receiving this medicine? Side effects that you should report to your doctor or health care professional as soon as possible:  allergic reactions like skin rash, itching or hives, swelling of the face, lips, or tongue  chest pain  diarrhea  flushing, runny nose, sweating during infusion  low blood counts - this medicine may decrease the number of white blood cells, red blood cells and platelets. You may be at increased risk for infections and bleeding.  nausea, vomiting  pain, swelling, warmth in the leg  signs of decreased platelets or bleeding - bruising, pinpoint red spots on the skin, black, tarry stools, blood in the urine  signs of infection - fever or chills, cough, sore throat, pain or difficulty passing urine  signs of decreased red blood cells - unusually weak or tired, fainting spells, lightheadedness Side effects that usually do not require medical attention (report to your doctor or health care professional if they continue or are bothersome):  constipation  hair loss  headache  loss of appetite  mouth sores  stomach pain This list may not describe all possible side effects. Call your doctor for medical advice about side effects. You may report side effects to FDA at 1-800-FDA-1088. Where should I keep my medicine? This drug is given in a hospital or clinic and will not be stored at home. NOTE: This sheet is a summary. It may not cover all possible information. If you have questions about this medicine, talk to your doctor, pharmacist, or health care provider.  2021 Elsevier/Gold Standard (2019-10-30 17:46:13)   Fluorouracil, 5-FU injection What is this medicine? FLUOROURACIL, 5-FU (flure oh YOOR a sil) is a chemotherapy drug. It slows the growth of cancer cells. This medicine is used to treat many types of cancer like breast cancer, colon or rectal cancer, pancreatic cancer, and stomach cancer. This  medicine may be used for other purposes; ask your health care provider or pharmacist if you have questions. COMMON BRAND NAME(S): Adrucil What should I tell my health care provider before I take this medicine? They need to know if you have any of these conditions:  blood disorders  dihydropyrimidine dehydrogenase (DPD) deficiency  infection (especially a virus infection such as chickenpox, cold sores, or herpes)  kidney disease  liver disease  malnourished, poor nutrition  recent or ongoing radiation therapy  an unusual or allergic reaction to fluorouracil, other chemotherapy, other medicines, foods, dyes, or preservatives  pregnant or trying to get pregnant  breast-feeding How should I use this medicine? This drug is given as an infusion or injection into a vein. It is administered in a hospital or clinic by a specially trained health care professional. Talk to your pediatrician regarding the use of this medicine in children. Special care may be needed. Overdosage: If you think you have taken too much of this medicine contact a poison control center or emergency room at once. NOTE: This medicine is only for you. Do not share this medicine with others. What if I miss a dose? It is important not to miss your dose. Call your doctor or health care professional if you are unable to keep an appointment. What may interact with this medicine? Do not take this medicine with any of the following medications:  live virus vaccines This medicine may also interact with the following medications:  medicines that treat or prevent blood clots like warfarin, enoxaparin, and dalteparin This list may not describe all possible interactions. Give your health care provider a list of all the medicines, herbs, non-prescription drugs, or dietary supplements you use. Also tell them if you smoke, drink alcohol, or use illegal drugs. Some items may interact with your medicine. What should I watch for  while using this medicine? Visit your doctor for checks on your progress. This drug may make you feel generally unwell. This is not uncommon, as chemotherapy can affect healthy cells as well as cancer cells. Report any side effects. Continue your course of treatment even though you feel ill unless your doctor tells you to stop. In some cases, you may be given additional medicines to help with side effects. Follow all directions for their use. Call your doctor or health care professional for advice if you get a fever, chills or sore throat, or other symptoms of a cold or flu. Do not treat yourself. This drug decreases your body's ability to fight infections. Try to avoid being around people who are sick. This medicine may increase your risk to bruise or bleed. Call your doctor or health care professional if you notice any unusual bleeding. Be careful brushing and flossing your teeth or using a toothpick because you may get an infection or bleed more easily. If you have any dental work done, tell your dentist you are receiving this medicine. Avoid taking products that contain aspirin, acetaminophen, ibuprofen, naproxen, or ketoprofen unless instructed by your doctor. These medicines may hide a fever. Do not become pregnant while taking this medicine. Women should inform their doctor if they wish to become pregnant or think they might be pregnant. There is a potential for serious side effects to an unborn child. Talk to your health care professional or pharmacist for more information. Do not breast-feed an infant while taking this medicine. Men should inform their doctor if they wish to father a child. This medicine may lower sperm counts. Do not treat diarrhea with over the counter products. Contact your doctor if you have diarrhea that lasts more than 2 days or if it is severe and watery. This medicine can make you more sensitive to the sun. Keep out of the sun. If you cannot avoid being in the sun, wear  protective clothing and use sunscreen. Do not use sun lamps or tanning beds/booths. What side effects may I notice  from receiving this medicine? Side effects that you should report to your doctor or health care professional as soon as possible:  allergic reactions like skin rash, itching or hives, swelling of the face, lips, or tongue  low blood counts - this medicine may decrease the number of white blood cells, red blood cells and platelets. You may be at increased risk for infections and bleeding.  signs of infection - fever or chills, cough, sore throat, pain or difficulty passing urine  signs of decreased platelets or bleeding - bruising, pinpoint red spots on the skin, black, tarry stools, blood in the urine  signs of decreased red blood cells - unusually weak or tired, fainting spells, lightheadedness  breathing problems  changes in vision  chest pain  mouth sores  nausea and vomiting  pain, swelling, redness at site where injected  pain, tingling, numbness in the hands or feet  redness, swelling, or sores on hands or feet  stomach pain  unusual bleeding Side effects that usually do not require medical attention (report to your doctor or health care professional if they continue or are bothersome):  changes in finger or toe nails  diarrhea  dry or itchy skin  hair loss  headache  loss of appetite  sensitivity of eyes to the light  stomach upset  unusually teary eyes This list may not describe all possible side effects. Call your doctor for medical advice about side effects. You may report side effects to FDA at 1-800-FDA-1088. Where should I keep my medicine? This drug is given in a hospital or clinic and will not be stored at home. NOTE: This sheet is a summary. It may not cover all possible information. If you have questions about this medicine, talk to your doctor, pharmacist, or health care provider.  2021 Elsevier/Gold Standard (2019-10-30  15:00:03)

## 2021-04-14 NOTE — Progress Notes (Signed)
1615 Patient complaining of horrific burning in the nose. She began crying. Dr. Benay Spice to chairside. Atropine .5mg  IV ordered and given. Patient given a warm towel to breathe in and out of to warm airway. 1625 patient verbalized relief and stopped crying. Dr. Benay Spice notified that vitals were stable and the nose burning had stopped. He stated that patient could resume irinotecan chemotherapy. Patient in agreement.

## 2021-04-15 ENCOUNTER — Telehealth: Payer: Self-pay | Admitting: *Deleted

## 2021-04-15 ENCOUNTER — Encounter: Payer: Self-pay | Admitting: *Deleted

## 2021-04-15 ENCOUNTER — Other Ambulatory Visit: Payer: Self-pay | Admitting: *Deleted

## 2021-04-15 LAB — CANCER ANTIGEN 19-9: CA 19-9: 24989 U/mL — ABNORMAL HIGH (ref 0–35)

## 2021-04-15 NOTE — Progress Notes (Unsigned)
24 hour chemo follow up: Patient continues to stutter and legs feel wobbly. Patient otherwise doing okay. Patient wants a four prong cane ordered for usage after chemo treatments. I will send a note to Ned Card NP regarding this. Patient will return on 04/16/2021 for IVF and a pump disconnect.

## 2021-04-15 NOTE — Telephone Encounter (Signed)
Called patient to f/u on status since treatment yesterday. Left VM requesting return call if she is having any issues.

## 2021-04-16 ENCOUNTER — Inpatient Hospital Stay: Payer: Medicaid Other

## 2021-04-16 ENCOUNTER — Other Ambulatory Visit: Payer: Self-pay

## 2021-04-16 ENCOUNTER — Telehealth: Payer: Self-pay | Admitting: *Deleted

## 2021-04-16 VITALS — BP 140/98 | HR 98 | Temp 98.0°F | Resp 18

## 2021-04-16 DIAGNOSIS — C250 Malignant neoplasm of head of pancreas: Secondary | ICD-10-CM

## 2021-04-16 DIAGNOSIS — Z5111 Encounter for antineoplastic chemotherapy: Secondary | ICD-10-CM | POA: Diagnosis not present

## 2021-04-16 MED ORDER — HEPARIN SOD (PORK) LOCK FLUSH 100 UNIT/ML IV SOLN
500.0000 [IU] | Freq: Once | INTRAVENOUS | Status: AC | PRN
  Administered 2021-04-16: 500 [IU]
  Filled 2021-04-16: qty 5

## 2021-04-16 MED ORDER — SODIUM CHLORIDE 0.9% FLUSH
10.0000 mL | INTRAVENOUS | Status: DC | PRN
  Administered 2021-04-16: 10 mL
  Filled 2021-04-16: qty 10

## 2021-04-16 MED ORDER — SODIUM CHLORIDE 0.9 % IV SOLN
Freq: Once | INTRAVENOUS | Status: AC
  Filled 2021-04-16: qty 250

## 2021-04-16 NOTE — Telephone Encounter (Signed)
Called patient to f/u on status. She is better today. Reports her legs still feel "wobbly". Informed her that the script for cane, adjustable wide base quad was faxed to her [facility] as she had requested.

## 2021-04-16 NOTE — Patient Instructions (Signed)
[facility]  Discharge Instructions: Thank you for choosing [facility] to provide your oncology and hematology care.   If you have a lab appointment with the [facility], please go directly to the [facility] and check in at the registration area.   Wear comfortable clothing and clothing appropriate for easy access to any Portacath or PICC line.   We strive to give you quality time with your provider. You may need to reschedule your appointment if you arrive late (15 or more minutes).  Arriving late affects you and other patients whose appointments are after yours.  Also, if you miss three or more appointments without notifying the office, you may be dismissed from the clinic at the provider's discretion.      For prescription refill requests, have your pharmacy contact our office and allow 72 hours for refills to be completed.    Today you received the following chemotherapy and/or immunotherapy agents IV fluids today and pump removed.      To help prevent nausea and vomiting after your treatment, we encourage you to take your nausea medication as directed.  BELOW ARE SYMPTOMS THAT SHOULD BE REPORTED IMMEDIATELY: . *FEVER GREATER THAN 100.4 F (38 C) OR HIGHER . *CHILLS OR SWEATING . *NAUSEA AND VOMITING THAT IS NOT CONTROLLED WITH YOUR NAUSEA MEDICATION . *UNUSUAL SHORTNESS OF BREATH . *UNUSUAL BRUISING OR BLEEDING . *URINARY PROBLEMS (pain or burning when urinating, or frequent urination) . *BOWEL PROBLEMS (unusual diarrhea, constipation, pain near the anus) . TENDERNESS IN MOUTH AND THROAT WITH OR WITHOUT PRESENCE OF ULCERS (sore throat, sores in mouth, or a toothache) . UNUSUAL RASH, SWELLING OR PAIN  . UNUSUAL VAGINAL DISCHARGE OR ITCHING   Items with * indicate a potential emergency and should be followed up as soon as possible or go to the Emergency Department if any problems should occur.  Please show the CHEMOTHERAPY ALERT CARD or  IMMUNOTHERAPY ALERT CARD at check-in to the Emergency Department and triage nurse.  Should you have questions after your visit or need to cancel or reschedule your appointment, please contact [facility]  Dept: [phone]  and follow the prompts.  Office hours are 8:00 a.m. to 4:30 p.m. Monday - Friday. Please note that voicemails left after 4:00 p.m. may not be returned until the following business day.  We are closed weekends and major holidays. You have access to a nurse at all times for urgent questions. Please call the main number to the clinic Dept: [phone] and follow the prompts.   For any non-urgent questions, you may also contact your provider using MyChart. We now offer e-Visits for anyone 2 and older to request care online for non-urgent symptoms. For details visit [email or web address].   Also download the MyChart app! Go to the app store, search "MyChart", open the app, select [facility], and log in with your MyChart username and password.  Due to Covid, a mask is required upon entering the hospital/clinic. If you do not have a mask, one will be given to you upon arrival. For doctor visits, patients may have 1 support person aged 40 or older with them. For treatment visits, patients cannot have anyone with them due to current Covid guidelines and our immunocompromised population.    Fluorouracil, 5-FU injection What is this medicine? FLUOROURACIL, 5-FU (flure oh YOOR a sil) is a chemotherapy drug. It slows the growth of cancer cells. This medicine is used to treat many types  of cancer like breast cancer, colon or rectal cancer, pancreatic cancer, and stomach cancer. This medicine may be used for other purposes; ask your health care provider or pharmacist if you have questions. COMMON BRAND NAME(S): Adrucil What should I tell my health care provider before I take this medicine? They need to know if you have any of these conditions:  blood  disorders  dihydropyrimidine dehydrogenase (DPD) deficiency  infection (especially a virus infection such as chickenpox, cold sores, or herpes)  kidney disease  liver disease  malnourished, poor nutrition  recent or ongoing radiation therapy  an unusual or allergic reaction to fluorouracil, other chemotherapy, other medicines, foods, dyes, or preservatives  pregnant or trying to get pregnant  breast-feeding How should I use this medicine? This drug is given as an infusion or injection into a vein. It is administered in a hospital or clinic by a specially trained health care professional. Talk to your pediatrician regarding the use of this medicine in children. Special care may be needed. Overdosage: If you think you have taken too much of this medicine contact a poison control center or emergency room at once. NOTE: This medicine is only for you. Do not share this medicine with others. What if I miss a dose? It is important not to miss your dose. Call your doctor or health care professional if you are unable to keep an appointment. What may interact with this medicine? Do not take this medicine with any of the following medications:  live virus vaccines This medicine may also interact with the following medications:  medicines that treat or prevent blood clots like warfarin, enoxaparin, and dalteparin This list may not describe all possible interactions. Give your health care provider a list of all the medicines, herbs, non-prescription drugs, or dietary supplements you use. Also tell them if you smoke, drink alcohol, or use illegal drugs. Some items may interact with your medicine. What should I watch for while using this medicine? Visit your doctor for checks on your progress. This drug may make you feel generally unwell. This is not uncommon, as chemotherapy can affect healthy cells as well as cancer cells. Report any side effects. Continue your course of treatment even though  you feel ill unless your doctor tells you to stop. In some cases, you may be given additional medicines to help with side effects. Follow all directions for their use. Call your doctor or health care professional for advice if you get a fever, chills or sore throat, or other symptoms of a cold or flu. Do not treat yourself. This drug decreases your body's ability to fight infections. Try to avoid being around people who are sick. This medicine may increase your risk to bruise or bleed. Call your doctor or health care professional if you notice any unusual bleeding. Be careful brushing and flossing your teeth or using a toothpick because you may get an infection or bleed more easily. If you have any dental work done, tell your dentist you are receiving this medicine. Avoid taking products that contain aspirin, acetaminophen, ibuprofen, naproxen, or ketoprofen unless instructed by your doctor. These medicines may hide a fever. Do not become pregnant while taking this medicine. Women should inform their doctor if they wish to become pregnant or think they might be pregnant. There is a potential for serious side effects to an unborn child. Talk to your health care professional or pharmacist for more information. Do not breast-feed an infant while taking this medicine. Men should inform their  doctor if they wish to father a child. This medicine may lower sperm counts. Do not treat diarrhea with over the counter products. Contact your doctor if you have diarrhea that lasts more than 2 days or if it is severe and watery. This medicine can make you more sensitive to the sun. Keep out of the sun. If you cannot avoid being in the sun, wear protective clothing and use sunscreen. Do not use sun lamps or tanning beds/booths. What side effects may I notice from receiving this medicine? Side effects that you should report to your doctor or health care professional as soon as possible:  allergic reactions like skin  rash, itching or hives, swelling of the face, lips, or tongue  low blood counts - this medicine may decrease the number of white blood cells, red blood cells and platelets. You may be at increased risk for infections and bleeding.  signs of infection - fever or chills, cough, sore throat, pain or difficulty passing urine  signs of decreased platelets or bleeding - bruising, pinpoint red spots on the skin, black, tarry stools, blood in the urine  signs of decreased red blood cells - unusually weak or tired, fainting spells, lightheadedness  breathing problems  changes in vision  chest pain  mouth sores  nausea and vomiting  pain, swelling, redness at site where injected  pain, tingling, numbness in the hands or feet  redness, swelling, or sores on hands or feet  stomach pain  unusual bleeding Side effects that usually do not require medical attention (report to your doctor or health care professional if they continue or are bothersome):  changes in finger or toe nails  diarrhea  dry or itchy skin  hair loss  headache  loss of appetite  sensitivity of eyes to the light  stomach upset  unusually teary eyes This list may not describe all possible side effects. Call your doctor for medical advice about side effects. You may report side effects to FDA at 1-800-FDA-1088. Where should I keep my medicine? This drug is given in a hospital or clinic and will not be stored at home. NOTE: This sheet is a summary. It may not cover all possible information. If you have questions about this medicine, talk to your doctor, pharmacist, or health care provider.  2021 Elsevier/Gold Standard (2019-10-30 15:00:03)

## 2021-04-17 ENCOUNTER — Encounter: Payer: Self-pay | Admitting: Dietician

## 2021-04-17 ENCOUNTER — Inpatient Hospital Stay: Payer: Medicaid Other | Admitting: Dietician

## 2021-04-17 NOTE — Progress Notes (Signed)
Nutrition  Patient scheduled for nutrition follow-up via telephone. Patient did not answer. Voicemail left with request for return call. Contact information provided.  Suzanne Azarie Coriz, RD, LDN Clinical Nutrition [facility] Cell [phone]  

## 2021-04-26 ENCOUNTER — Other Ambulatory Visit: Payer: Self-pay | Admitting: Oncology

## 2021-04-27 ENCOUNTER — Ambulatory Visit: Payer: Self-pay | Admitting: Nurse Practitioner

## 2021-04-27 ENCOUNTER — Inpatient Hospital Stay (HOSPITAL_BASED_OUTPATIENT_CLINIC_OR_DEPARTMENT_OTHER): Payer: Medicaid Other | Admitting: Nurse Practitioner

## 2021-04-27 ENCOUNTER — Other Ambulatory Visit: Payer: Self-pay

## 2021-04-27 ENCOUNTER — Inpatient Hospital Stay: Payer: Medicaid Other

## 2021-04-27 ENCOUNTER — Encounter: Payer: Self-pay | Admitting: Nurse Practitioner

## 2021-04-27 VITALS — BP 147/99 | HR 89 | Temp 97.7°F | Resp 18 | Ht 64.0 in | Wt 238.0 lb

## 2021-04-27 VITALS — BP 150/95 | HR 92 | Resp 24

## 2021-04-27 DIAGNOSIS — E876 Hypokalemia: Secondary | ICD-10-CM

## 2021-04-27 DIAGNOSIS — Z5111 Encounter for antineoplastic chemotherapy: Secondary | ICD-10-CM | POA: Diagnosis not present

## 2021-04-27 DIAGNOSIS — C250 Malignant neoplasm of head of pancreas: Secondary | ICD-10-CM

## 2021-04-27 LAB — CBC WITH DIFFERENTIAL (CANCER CENTER ONLY)
Abs Immature Granulocytes: 0.01 10*3/uL (ref 0.00–0.07)
Basophils Absolute: 0 10*3/uL (ref 0.0–0.1)
Basophils Relative: 0 %
Eosinophils Absolute: 0.2 10*3/uL (ref 0.0–0.5)
Eosinophils Relative: 8 %
HCT: 31.1 % — ABNORMAL LOW (ref 36.0–46.0)
Hemoglobin: 10 g/dL — ABNORMAL LOW (ref 12.0–15.0)
Immature Granulocytes: 0 %
Lymphocytes Relative: 24 %
Lymphs Abs: 0.7 10*3/uL (ref 0.7–4.0)
MCH: 27.9 pg (ref 26.0–34.0)
MCHC: 32.2 g/dL (ref 30.0–36.0)
MCV: 86.9 fL (ref 80.0–100.0)
Monocytes Absolute: 0.2 10*3/uL (ref 0.1–1.0)
Monocytes Relative: 8 %
Neutro Abs: 1.7 10*3/uL (ref 1.7–7.7)
Neutrophils Relative %: 60 %
Platelet Count: 190 10*3/uL (ref 150–400)
RBC: 3.58 MIL/uL — ABNORMAL LOW (ref 3.87–5.11)
RDW: 18.7 % — ABNORMAL HIGH (ref 11.5–15.5)
WBC Count: 2.8 10*3/uL — ABNORMAL LOW (ref 4.0–10.5)
nRBC: 0 % (ref 0.0–0.2)

## 2021-04-27 LAB — CMP (CANCER CENTER ONLY)
ALT: 21 U/L (ref 0–44)
AST: 38 U/L (ref 15–41)
Albumin: 3.2 g/dL — ABNORMAL LOW (ref 3.5–5.0)
Alkaline Phosphatase: 76 U/L (ref 38–126)
Anion gap: 8 (ref 5–15)
BUN: 6 mg/dL (ref 6–20)
CO2: 29 mmol/L (ref 22–32)
Calcium: 8.6 mg/dL — ABNORMAL LOW (ref 8.9–10.3)
Chloride: 103 mmol/L (ref 98–111)
Creatinine: 0.68 mg/dL (ref 0.44–1.00)
GFR, Estimated: >60 mL/min (ref >60)
Glucose, Bld: 109 mg/dL — ABNORMAL HIGH (ref 70–99)
Potassium: 2.9 mmol/L — ABNORMAL LOW (ref 3.5–5.1)
Sodium: 140 mmol/L (ref 135–145)
Total Bilirubin: 0.5 mg/dL (ref 0.3–1.2)
Total Protein: 5.9 g/dL — ABNORMAL LOW (ref 6.5–8.1)

## 2021-04-27 LAB — MAGNESIUM: Magnesium: 1.6 mg/dL — ABNORMAL LOW (ref 1.7–2.4)

## 2021-04-27 MED ORDER — FOSAPREPITANT DIMEGLUMINE INJECTION 150 MG
150.0000 mg | Freq: Once | INTRAVENOUS | Status: AC
Start: 2021-04-27 — End: 2021-04-27
  Administered 2021-04-27: 150 mg via INTRAVENOUS
  Filled 2021-04-27: qty 5

## 2021-04-27 MED ORDER — ATROPINE SULFATE 1 MG/ML IJ SOLN
0.5000 mg | Freq: Once | INTRAMUSCULAR | Status: AC
  Administered 2021-04-27: 0.5 mg via INTRAVENOUS

## 2021-04-27 MED ORDER — SODIUM CHLORIDE 0.9% FLUSH
10.0000 mL | INTRAVENOUS | Status: DC | PRN
  Administered 2021-04-27: 10 mL
  Filled 2021-04-27: qty 10

## 2021-04-27 MED ORDER — OXALIPLATIN CHEMO INJECTION 100 MG/20ML
65.0000 mg/m2 | Freq: Once | INTRAVENOUS | Status: AC
  Administered 2021-04-27: 145 mg via INTRAVENOUS
  Filled 2021-04-27: qty 29

## 2021-04-27 MED ORDER — PALONOSETRON HCL INJECTION 0.25 MG/5ML
0.2500 mg | Freq: Once | INTRAVENOUS | Status: AC
Start: 2021-04-27 — End: 2021-04-27
  Administered 2021-04-27: 0.25 mg via INTRAVENOUS
  Filled 2021-04-27: qty 5

## 2021-04-27 MED ORDER — SODIUM CHLORIDE 0.9 % IV SOLN
150.0000 mg/m2 | Freq: Once | INTRAVENOUS | Status: AC
  Administered 2021-04-27: 340 mg via INTRAVENOUS
  Filled 2021-04-27: qty 15

## 2021-04-27 MED ORDER — DEXTROSE 5 % IV SOLN
Freq: Once | INTRAVENOUS | Status: AC
  Filled 2021-04-27: qty 250

## 2021-04-27 MED ORDER — MAGNESIUM OXIDE -MG SUPPLEMENT 400 (240 MG) MG PO TABS: 400.0000 mg | ORAL_TABLET | Freq: Every day | ORAL | 2 refills | Status: DC

## 2021-04-27 MED ORDER — SODIUM CHLORIDE 0.9 % IV SOLN
2000.0000 mg/m2 | INTRAVENOUS | Status: DC
  Administered 2021-04-27: 4400 mg via INTRAVENOUS
  Filled 2021-04-27: qty 88

## 2021-04-27 MED ORDER — DIPHENHYDRAMINE HCL 50 MG/ML IJ SOLN
25.0000 mg | Freq: Once | INTRAMUSCULAR | Status: AC
  Administered 2021-04-27: 25 mg via INTRAVENOUS
  Filled 2021-04-27: qty 1

## 2021-04-27 MED ORDER — POTASSIUM CHLORIDE CRYS ER 20 MEQ PO TBCR
20.0000 meq | EXTENDED_RELEASE_TABLET | Freq: Once | ORAL | Status: AC
  Administered 2021-04-27: 20 meq via ORAL
  Filled 2021-04-27: qty 1

## 2021-04-27 MED ORDER — SODIUM CHLORIDE 0.9 % IV SOLN
10.0000 mg | Freq: Once | INTRAVENOUS | Status: AC
  Administered 2021-04-27: 10 mg via INTRAVENOUS
  Filled 2021-04-27: qty 1

## 2021-04-27 MED ORDER — FAMOTIDINE 20 MG IN NS 100 ML IVPB
20.0000 mg | Freq: Once | INTRAVENOUS | Status: AC
  Administered 2021-04-27: 20 mg via INTRAVENOUS
  Filled 2021-04-27: qty 100

## 2021-04-27 MED ORDER — ATROPINE SULFATE 1 MG/ML IJ SOLN
INTRAMUSCULAR | Status: AC
  Filled 2021-04-27: qty 1

## 2021-04-27 MED ORDER — ATROPINE SULFATE 1 MG/ML IJ SOLN
0.5000 mg | Freq: Once | INTRAMUSCULAR | Status: AC
  Administered 2021-04-27: 0.5 mg via INTRAVENOUS
  Filled 2021-04-27: qty 1

## 2021-04-27 MED ORDER — SODIUM CHLORIDE 0.9 % IV SOLN
400.0000 mg/m2 | Freq: Once | INTRAVENOUS | Status: AC
  Administered 2021-04-27: 884 mg via INTRAVENOUS
  Filled 2021-04-27: qty 44.2

## 2021-04-27 NOTE — Progress Notes (Signed)
Patient complaining of severe abdominal cramping @ 1439. Irinotecan paused. Dr. Benay Spice notified. VS obtained. Orders given to give Atropine.5mg  x 1 via IV. May restart Irinotecan per Dr. Benay Spice once patient is back to baseline.

## 2021-04-27 NOTE — Progress Notes (Signed)
Irinotecan restarted at 1445. Patient was able to complete remainder of infusion without any other issues.

## 2021-04-27 NOTE — Progress Notes (Addendum)
[facility] OFFICE PROGRESS NOTE   Diagnosis: Pancreas cancer  INTERVAL HISTORY:   [facility] returns as scheduled.  She completed a cycle of FOLFIRINOX 04/14/2021.  She developed chest tightness near the completion of Oxaliplatin.  She was given Benadryl 25 mg IV.  Symptoms resolved quickly.  Oxaliplatin was not resumed.  She reports developing muscle cramps during the irinotecan.  She received atropine.  She denies nausea/vomiting.  No mouth sores.  No diarrhea.  She reports a good appetite.  She notes an alteration in taste.  She has mild intermittent cold sensitivity.  No numbness or tingling in the absence of cold exposure.  Abdominal pain overall is better.  She has occasional sharp abdominal pains.  Objective:  Vital signs in last 24 hours:  Blood pressure (!) 147/99, pulse 89, temperature 97.7 F (36.5 C), temperature source Oral, resp. rate 18, height 5' 4" (1.626 m), weight 238 lb (108 kg), SpO2 100 %.    HEENT: No thrush or ulcers. Resp: Lungs clear bilaterally. Cardio: Regular rate and rhythm. GI: Abdomen soft and nontender.  No hepatomegaly. Vascular: No leg edema. Neuro: Vibratory sense intact over the fingertips per tuning fork exam. Skin: Palms without erythema. Port-A-Cath without erythema.   Lab Results:  Lab Results  Component Value Date   WBC 2.8 (L) 04/27/2021   HGB 10.0 (L) 04/27/2021   HCT 31.1 (L) 04/27/2021   MCV 86.9 04/27/2021   PLT 190 04/27/2021   NEUTROABS 1.7 04/27/2021    Imaging:  No results found.  Medications: I have reviewed the patient's current medications.  Assessment/Plan: 1.Pancreas cancer -CT abdomen/pelvis without contrast 02/16/2021-multiple large hypoechoic masses in the patient's liver consistent metastatic disease, ill-defined masslike area in the pancreatic head measuring approximate 4.7 cm, possible filling defect in the right ventricle. 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 -Flexible sigmoidoscopy 02/19/2021-diverticulosis in the sigmoid colon, nonbleeding external and internal hemorrhoids -Cardiac CT 02/20/2021-mass in the mid RV attached to the interventricular septum measuring 16 mm x 6 mm and concerning for metastatic tumor -MRI abdomen with/without contrast 02/21/2021-mass in the posterior pancreatic head measuring 4.3 x 3.8 cm with obstruction of the central pancreatic duct and diffuse dilatation up to 6 mm consistent with pancreatic adenocarcinoma, liver lesions the largest mass of the central left lobe measuring 9.9 x 9.2 cm consistent with hepatic metastatic disease, hepatomegaly. -Ultrasound-guided biopsy of a left liver lesion 02/24/2021-adenocarcinoma, morphologically similar to the duodenal biopsy; microsatellite stable, tumor mutation burden 1 -Negative genetic testing -Palliative radiation to the duodenal mass 02/25/2021-03/06/2021 -Cycle 1 FOLFOX 03/17/2021 -Cycle 2 FOLFOX 04/01/2021 -Cycle 3 FOLFIRINOX 04/14/2021 -Cycle 4 FOLFIRINOX 04/27/2021, Udenyca added 2. Iron deficiency anemia-improved -Feraheme 510 mg 02/20/2021 3. Protein calorie malnutrition 4. Possible right ventricular filling defect on noncontrast CT scan  MRI cardiac morphology 02/21/2021-mass in the mid RV attached to the interventricular septum measures 16 mm x 6 mm. Mass appears heterogeneous with area of enhancement on LGE imaging. Mass is concerning for metastatic tumor. Normal LV size and systolic function. Normal RV size and systolic function. 5. Hypertension 6. History of CVA 7. Hyperlipidemia 8. Hypothyroidism 9. Morbid obesity 10. Asthma 11. Migraines 12.Pain secondary to #1 13.Port-A-Cath placement 02/25/2021 14.Hypokalemia 04/01/2021-started a potassium supplement   Disposition: [name] appears stable.  She completed a cycle of FOLFIRINOX 04/14/2021.  Premedications adjusted for today's treatment  due to chest tightness at the end of the oxaliplatin infusion and muscle cramps during the irinotecan.  Plan to proceed with FOLFIRINOX today as scheduled.  We reviewed the CBC from today.  The absolute neutrophil count is at the low end of the normal range.  She will receive Udenyca on the day of pump discontinuation.  We reviewed potential toxicities associated with white cell growth factor support including bone pain, rash, splenic rupture.  She agrees to proceed.  Plan for restaging CTs after cycle 5.  She will return for lab, follow-up, cycle 5 systemic therapy in 2 weeks.  She will contact the office in the interim with any problems.  Patient seen with Dr. Benay Spice.  Addendum 9:48 AM-she has hypokalemia and hypomagnesemia.  She has not been taking oral potassium.  She will resume with instructions to take 20 mEq twice daily for the next 3 days, then once daily.  She will begin magnesium oxide 400 mg daily.  [name]   04/27/2021  8:49 AM  This was a shared visit with Ned Card.  [facility] has an improved performance status after completing 3 cycles of chemotherapy.  She developed anticholinergic symptoms and neurologic symptoms during the last chemotherapy infusion.  The oxaliplatin will be dose reduced and given over 3 hours with this cycle.  She will receive atropine as a premedication.  She will return for an office visit and chemotherapy in 2 weeks.  She received an additional 0.5 mg of atropine during the irinotecan infusion for treatment of abdominal cramps.  I was present for greater than 50% of today's visit.  I performed medical decision making.

## 2021-04-27 NOTE — Patient Instructions (Signed)
[facility]  Discharge Instructions: Thank you for choosing [facility] to provide your oncology and hematology care.   If you have a lab appointment with the [facility], please go directly to the [facility] and check in at the registration area.   Wear comfortable clothing and clothing appropriate for easy access to any Portacath or PICC line.   We strive to give you quality time with your provider. You may need to reschedule your appointment if you arrive late (15 or more minutes).  Arriving late affects you and other patients whose appointments are after yours.  Also, if you miss three or more appointments without notifying the office, you may be dismissed from the clinic at the provider's discretion.      For prescription refill requests, have your pharmacy contact our office and allow 72 hours for refills to be completed.    Today you received the following chemotherapy and/or immunotherapy agents: Oxaliplatin, Irinotecan, 5FU, Leucovorin  We need you to pick up Magnesium Oxide 400mg  tablets. Take 1 tablet daily. You can pick this up at [facility], [facility], [facility], etc. You may purchase the store brand to save money. We are giving this to you because your Magnesium level is low.   Potassium 76meq tablets. For the next 3 days, take 1 tablet twice a day then resume taking 1 tablet daily. Dissolve these tablets in pudding or applesauce at room temperature.   It is important for your overall health and heart health for your potassium to be within normal range.   Hand out for potassium rich foods given in clinic.     To help prevent nausea and vomiting after your treatment, we encourage you to take your nausea medication as directed.  BELOW ARE SYMPTOMS THAT SHOULD BE REPORTED IMMEDIATELY: . *FEVER GREATER THAN 100.4 F (38 C) OR HIGHER . *CHILLS OR SWEATING . *NAUSEA AND VOMITING THAT IS NOT CONTROLLED WITH YOUR NAUSEA  MEDICATION . *UNUSUAL SHORTNESS OF BREATH . *UNUSUAL BRUISING OR BLEEDING . *URINARY PROBLEMS (pain or burning when urinating, or frequent urination) . *BOWEL PROBLEMS (unusual diarrhea, constipation, pain near the anus) . TENDERNESS IN MOUTH AND THROAT WITH OR WITHOUT PRESENCE OF ULCERS (sore throat, sores in mouth, or a toothache) . UNUSUAL RASH, SWELLING OR PAIN  . UNUSUAL VAGINAL DISCHARGE OR ITCHING   Items with * indicate a potential emergency and should be followed up as soon as possible or go to the Emergency Department if any problems should occur.  Please show the CHEMOTHERAPY ALERT CARD or IMMUNOTHERAPY ALERT CARD at check-in to the Emergency Department and triage nurse.  Should you have questions after your visit or need to cancel or reschedule your appointment, please contact [facility]  Dept: [phone]  and follow the prompts.  Office hours are 8:00 a.m. to 4:30 p.m. Monday - Friday. Please note that voicemails left after 4:00 p.m. may not be returned until the following business day.  We are closed weekends and major holidays. You have access to a nurse at all times for urgent questions. Please call the main number to the clinic Dept: [phone] and follow the prompts.   For any non-urgent questions, you may also contact your provider using MyChart. We now offer e-Visits for anyone 52 and older to request care online for non-urgent symptoms. For details visit [email or web address].   Also download the MyChart app! Go to the app store, search "MyChart", open the app, select  [facility], and log in with your MyChart username and password.  Due to Covid, a mask is required upon entering the hospital/clinic. If you do not have a mask, one will be given to you upon arrival. For doctor visits, patients may have 1 support person aged 40 or older with them. For treatment visits, patients cannot have anyone with them due to current Covid guidelines and our  immunocompromised population.

## 2021-04-28 ENCOUNTER — Ambulatory Visit: Payer: Self-pay | Admitting: Nurse Practitioner

## 2021-04-28 ENCOUNTER — Ambulatory Visit: Payer: Self-pay

## 2021-04-28 ENCOUNTER — Other Ambulatory Visit: Payer: Self-pay

## 2021-04-29 ENCOUNTER — Other Ambulatory Visit: Payer: Self-pay

## 2021-04-29 ENCOUNTER — Inpatient Hospital Stay: Payer: Medicaid Other

## 2021-04-29 ENCOUNTER — Ambulatory Visit: Payer: Self-pay

## 2021-04-29 VITALS — BP 137/89 | HR 93 | Temp 97.7°F | Resp 18

## 2021-04-29 DIAGNOSIS — R110 Nausea: Secondary | ICD-10-CM

## 2021-04-29 DIAGNOSIS — C250 Malignant neoplasm of head of pancreas: Secondary | ICD-10-CM

## 2021-04-29 DIAGNOSIS — Z5111 Encounter for antineoplastic chemotherapy: Secondary | ICD-10-CM | POA: Diagnosis not present

## 2021-04-29 DIAGNOSIS — Z95828 Presence of other vascular implants and grafts: Secondary | ICD-10-CM

## 2021-04-29 LAB — CANCER ANTIGEN 19-9: CA 19-9: 21909 U/mL — ABNORMAL HIGH (ref 0–35)

## 2021-04-29 MED ORDER — HEPARIN SOD (PORK) LOCK FLUSH 100 UNIT/ML IV SOLN
500.0000 [IU] | Freq: Once | INTRAVENOUS | Status: AC | PRN
  Administered 2021-04-29: 500 [IU]
  Filled 2021-04-29: qty 5

## 2021-04-29 MED ORDER — SODIUM CHLORIDE 0.9% FLUSH
10.0000 mL | INTRAVENOUS | Status: DC | PRN
  Administered 2021-04-29: 10 mL
  Filled 2021-04-29: qty 10

## 2021-04-29 MED ORDER — SODIUM CHLORIDE 0.9 % IV SOLN
Freq: Once | INTRAVENOUS | Status: AC
  Filled 2021-04-29: qty 250

## 2021-04-29 MED ORDER — PEGFILGRASTIM-CBQV 6 MG/0.6ML ~~LOC~~ SOSY
6.0000 mg | PREFILLED_SYRINGE | Freq: Once | SUBCUTANEOUS | Status: AC
  Administered 2021-04-29: 6 mg via SUBCUTANEOUS

## 2021-04-29 NOTE — Progress Notes (Signed)
..  The following Medication: Ellen Henri is approved for drug replacement program by [employer]. The enrollment period is from 04/29/2021 to 04/29/2022.  Reason for Assistance: Self Pay. ID: [id number] First DOS:04/29/2021.

## 2021-04-29 NOTE — Progress Notes (Signed)
Opened in error

## 2021-04-29 NOTE — Patient Instructions (Addendum)
Wendy Hoffman  Discharge Instructions: Thank you for choosing [facility] to provide your oncology and hematology care.   If you have a lab appointment with the [facility], please go directly to the [facility] and check in at the registration area.   Wear comfortable clothing and clothing appropriate for easy access to any Portacath or PICC line.   We strive to give you quality time with your provider. You may need to reschedule your appointment if you arrive late (15 or more minutes).  Arriving late affects you and other patients whose appointments are after yours.  Also, if you miss three or more appointments without notifying the office, you may be dismissed from the clinic at the provider's discretion.      For prescription refill requests, have your pharmacy contact our office and allow 72 hours for refills to be completed.    Today you received the following: udenyca   To help prevent nausea and vomiting after your treatment, we encourage you to take your nausea medication as directed.  BELOW ARE SYMPTOMS THAT SHOULD BE REPORTED IMMEDIATELY: . *FEVER GREATER THAN 100.4 F (38 C) OR HIGHER . *CHILLS OR SWEATING . *NAUSEA AND VOMITING THAT IS NOT CONTROLLED WITH YOUR NAUSEA MEDICATION . *UNUSUAL SHORTNESS OF BREATH . *UNUSUAL BRUISING OR BLEEDING . *URINARY PROBLEMS (pain or burning when urinating, or frequent urination) . *BOWEL PROBLEMS (unusual diarrhea, constipation, pain near the anus) . TENDERNESS IN MOUTH AND THROAT WITH OR WITHOUT PRESENCE OF ULCERS (sore throat, sores in mouth, or a toothache) . UNUSUAL RASH, SWELLING OR PAIN  . UNUSUAL VAGINAL DISCHARGE OR ITCHING   Items with * indicate a potential emergency and should be followed up as soon as possible or go to the Emergency Department if any problems should occur.  Please show the CHEMOTHERAPY ALERT CARD or IMMUNOTHERAPY ALERT CARD at check-in to the Emergency Department and  triage nurse.  Should you have questions after your visit or need to cancel or reschedule your appointment, please contact [facility]  Dept: [phone]  and follow the prompts.  Office hours are 8:00 a.m. to 4:30 p.m. Monday - Friday. Please note that voicemails left after 4:00 p.m. may not be returned until the following business day.  We are closed weekends and major holidays. You have access to a nurse at all times for urgent questions. Please call the main number to the clinic Dept: [phone] and follow the prompts.   For any non-urgent questions, you may also contact your provider using MyChart. We now offer e-Visits for anyone 39 and older to request care online for non-urgent symptoms. For details visit [email or web address].   Also download the MyChart app! Go to the app store, search "MyChart", open the app, select [facility], and log in with your MyChart username and password.  Due to Covid, a mask is required upon entering the hospital/clinic. If you do not have a mask, one will be given to you upon arrival. For doctor visits, patients may have 1 support person aged 16 or older with them. For treatment visits, patients cannot have anyone with them due to current Covid guidelines and our immunocompromised population.   Pegfilgrastim injection What is this medicine? PEGFILGRASTIM (PEG fil gra stim) is a long-acting granulocyte colony-stimulating factor that stimulates the growth of neutrophils, a type of white blood cell important in the body's fight against infection. It is used to reduce the incidence of fever and  infection in patients with certain types of cancer who are receiving chemotherapy that affects the bone marrow, and to increase survival after being exposed to high doses of radiation. This medicine may be used for other purposes; ask your health care provider or pharmacist if you have questions. COMMON BRAND NAME(S): Rexene Edison, Ziextenzo What should I tell my health care provider before I take this medicine? They need to know if you have any of these conditions:  kidney disease  latex allergy  ongoing radiation therapy  sickle cell disease  skin reactions to acrylic adhesives (On-Body Injector only)  an unusual or allergic reaction to pegfilgrastim, filgrastim, other medicines, foods, dyes, or preservatives  pregnant or trying to get pregnant  breast-feeding How should I use this medicine? This medicine is for injection under the skin. If you get this medicine at home, you will be taught how to prepare and give the pre-filled syringe or how to use the On-body Injector. Refer to the patient Instructions for Use for detailed instructions. Use exactly as directed. Tell your healthcare provider immediately if you suspect that the On-body Injector may not have performed as intended or if you suspect the use of the On-body Injector resulted in a missed or partial dose. It is important that you put your used needles and syringes in a special sharps container. Do not put them in a trash can. If you do not have a sharps container, call your pharmacist or healthcare provider to get one. Talk to your pediatrician regarding the use of this medicine in children. While this drug may be prescribed for selected conditions, precautions do apply. Overdosage: If you think you have taken too much of this medicine contact a poison control center or emergency room at once. NOTE: This medicine is only for you. Do not share this medicine with others. What if I miss a dose? It is important not to miss your dose. Call your doctor or health care professional if you miss your dose. If you miss a dose due to an On-body Injector failure or leakage, a new dose should be administered as soon as possible using a single prefilled syringe for manual use. What may interact with this medicine? Interactions have not been  studied. This list may not describe all possible interactions. Give your health care provider a list of all the medicines, herbs, non-prescription drugs, or dietary supplements you use. Also tell them if you smoke, drink alcohol, or use illegal drugs. Some items may interact with your medicine. What should I watch for while using this medicine? Your condition will be monitored carefully while you are receiving this medicine. You may need blood work done while you are taking this medicine. Talk to your health care provider about your risk of cancer. You may be more at risk for certain types of cancer if you take this medicine. If you are going to need a MRI, CT scan, or other procedure, tell your doctor that you are using this medicine (On-Body Injector only). What side effects may I notice from receiving this medicine? Side effects that you should report to your doctor or health care professional as soon as possible:  allergic reactions (skin rash, itching or hives, swelling of the face, lips, or tongue)  back pain  dizziness  fever  pain, redness, or irritation at site where injected  pinpoint red spots on the skin  red or dark-brown urine  shortness of breath or breathing problems  stomach or side pain,  or pain at the shoulder  swelling  tiredness  trouble passing urine or change in the amount of urine  unusual bruising or bleeding Side effects that usually do not require medical attention (report to your doctor or health care professional if they continue or are bothersome):  bone pain  muscle pain This list may not describe all possible side effects. Call your doctor for medical advice about side effects. You may report side effects to FDA at 1-800-FDA-1088. Where should I keep my medicine? Keep out of the reach of children. If you are using this medicine at home, you will be instructed on how to store it. Throw away any unused medicine after the expiration date on the  label. NOTE: This sheet is a summary. It may not cover all possible information. If you have questions about this medicine, talk to your doctor, pharmacist, or health care provider.  2021 Elsevier/Gold Standard (2019-12-21 13:20:51)  Rehydration, Adult Rehydration is the replacement of body fluids, salts, and minerals (electrolytes) that are lost during dehydration. Dehydration is when there is not enough water or other fluids in the body. This happens when you lose more fluids than you take in. Common causes of dehydration include:  Not drinking enough fluids. This can occur when you are ill or doing activities that require a lot of energy, especially in hot weather.  Conditions that cause loss of water or other fluids, such as diarrhea, vomiting, sweating, or urinating a lot.  Other illnesses, such as fever or infection.  Certain medicines, such as those that remove excess fluid from the body (diuretics). Symptoms of mild or moderate dehydration may include thirst, dry lips and mouth, and dizziness. Symptoms of severe dehydration may include increased heart rate, confusion, fainting, and not urinating. For severe dehydration, you may need to get fluids through an IV at the hospital. For mild or moderate dehydration, you can usually rehydrate at home by drinking certain fluids as told by your health care provider. What are the risks? Generally, rehydration is safe. However, taking in too much fluid (overhydration) can be a problem. This is rare. Overhydration can cause an electrolyte imbalance, kidney failure, or a decrease in salt (sodium) levels in the body. Supplies needed You will need an oral rehydration solution (ORS) if your health care provider tells you to use one. This is a drink to treat dehydration. It can be found in pharmacies and retail stores. How to rehydrate Fluids Follow instructions from your health care provider for rehydration. The kind of fluid and the amount you should  drink depend on your condition. In general, you should choose drinks that you prefer.  If told by your health care provider, drink an ORS. ? Make an ORS by following instructions on the package. ? Start by drinking small amounts, about  cup (120 mL) every 5-10 minutes. ? Slowly increase how much you drink until you have taken the amount recommended by your health care provider.  Drink enough clear fluids to keep your urine pale yellow. If you were told to drink an ORS, finish it first, then start slowly drinking other clear fluids. Drink fluids such as: ? Water. This includes sparkling water and flavored water. Drinking only water can lead to having too little sodium in your body (hyponatremia). Follow the advice of your health care provider. ? Water from ice chips you suck on. ? Fruit juice with water you add to it (diluted). ? Sports drinks. ? Hot or cold herbal teas. ? Broth-based  soups. ? Milk or milk products. Food Follow instructions from your health care provider about what to eat while you rehydrate. Your health care provider may recommend that you slowly begin eating regular foods in small amounts.  Eat foods that contain a healthy balance of electrolytes, such as bananas, oranges, potatoes, tomatoes, and spinach.  Avoid foods that are greasy or contain a lot of sugar. In some cases, you may get nutrition through a feeding tube that is passed through your nose and into your stomach (nasogastric tube, or NG tube). This may be done if you have uncontrolled vomiting or diarrhea.   Beverages to avoid Certain beverages may make dehydration worse. While you rehydrate, avoid drinking alcohol.   How to tell if you are recovering from dehydration You may be recovering from dehydration if:  You are urinating more often than before you started rehydrating.  Your urine is pale yellow.  Your energy level improves.  You vomit less frequently.  You have diarrhea less frequently.  Your  appetite improves or returns to normal.  You feel less dizzy or less light-headed.  Your skin tone and color start to look more normal. Follow these instructions at home:  Take over-the-counter and prescription medicines only as told by your health care provider.  Do not take sodium tablets. Doing this can lead to having too much sodium in your body (hypernatremia). Contact a health care provider if:  You continue to have symptoms of mild or moderate dehydration, such as: ? Thirst. ? Dry lips. ? Slightly dry mouth. ? Dizziness. ? Dark urine or less urine than normal. ? Muscle cramps.  You continue to vomit or have diarrhea. Get help right away if you:  Have symptoms of dehydration that get worse.  Have a fever.  Have a severe headache.  Have been vomiting and the following happens: ? Your vomiting gets worse or does not go away. ? Your vomit includes blood or green matter (bile). ? You cannot eat or drink without vomiting.  Have problems with urination or bowel movements, such as: ? Diarrhea that gets worse or does not go away. ? Blood in your stool (feces). This may cause stool to look black and tarry. ? Not urinating, or urinating only a small amount of very dark urine, within 6-8 hours.  Have trouble breathing.  Have symptoms that get worse with treatment. These symptoms may represent a serious problem that is an emergency. Do not wait to see if the symptoms will go away. Get medical help right away. Call your local emergency services (911 in the [place] Do not drive yourself to the hospital. Summary  Rehydration is the replacement of body fluids and minerals (electrolytes) that are lost during dehydration.  Follow instructions from your health care provider for rehydration. The kind of fluid and amount you should drink depend on your condition.  Slowly increase how much you drink until you have taken the amount recommended by your health care provider.  Contact  your health care provider if you continue to show signs of mild or moderate dehydration. This information is not intended to replace advice given to you by your health care provider. Make sure you discuss any questions you have with your health care provider. Document Revised: 01/30/2020 Document Reviewed: 12/10/2019 Elsevier Patient Education  2021 [employer].

## 2021-05-03 ENCOUNTER — Other Ambulatory Visit: Payer: Self-pay | Admitting: Nurse Practitioner

## 2021-05-03 DIAGNOSIS — C250 Malignant neoplasm of head of pancreas: Secondary | ICD-10-CM

## 2021-05-04 ENCOUNTER — Encounter: Payer: Self-pay | Admitting: Oncology

## 2021-05-10 ENCOUNTER — Other Ambulatory Visit: Payer: Self-pay | Admitting: Oncology

## 2021-05-12 ENCOUNTER — Other Ambulatory Visit: Payer: Self-pay

## 2021-05-12 ENCOUNTER — Ambulatory Visit: Payer: Self-pay

## 2021-05-12 ENCOUNTER — Inpatient Hospital Stay (HOSPITAL_BASED_OUTPATIENT_CLINIC_OR_DEPARTMENT_OTHER): Payer: Medicaid Other | Admitting: Nurse Practitioner

## 2021-05-12 ENCOUNTER — Encounter: Payer: Self-pay | Admitting: Nurse Practitioner

## 2021-05-12 ENCOUNTER — Inpatient Hospital Stay: Payer: Medicaid Other

## 2021-05-12 VITALS — BP 135/98 | HR 98 | Temp 97.8°F | Resp 18 | Ht 64.0 in | Wt 229.0 lb

## 2021-05-12 DIAGNOSIS — C250 Malignant neoplasm of head of pancreas: Secondary | ICD-10-CM

## 2021-05-12 DIAGNOSIS — Z5111 Encounter for antineoplastic chemotherapy: Secondary | ICD-10-CM | POA: Diagnosis not present

## 2021-05-12 DIAGNOSIS — Z95828 Presence of other vascular implants and grafts: Secondary | ICD-10-CM

## 2021-05-12 LAB — CMP (CANCER CENTER ONLY)
ALT: 20 U/L (ref 0–44)
AST: 37 U/L (ref 15–41)
Albumin: 3.3 g/dL — ABNORMAL LOW (ref 3.5–5.0)
Alkaline Phosphatase: 97 U/L (ref 38–126)
Anion gap: 10 (ref 5–15)
BUN: 10 mg/dL (ref 6–20)
CO2: 31 mmol/L (ref 22–32)
Calcium: 9 mg/dL (ref 8.9–10.3)
Chloride: 101 mmol/L (ref 98–111)
Creatinine: 0.66 mg/dL (ref 0.44–1.00)
GFR, Estimated: >60 mL/min (ref >60)
Glucose, Bld: 98 mg/dL (ref 70–99)
Potassium: 2.9 mmol/L — ABNORMAL LOW (ref 3.5–5.1)
Sodium: 142 mmol/L (ref 135–145)
Total Bilirubin: 0.5 mg/dL (ref 0.3–1.2)
Total Protein: 5.8 g/dL — ABNORMAL LOW (ref 6.5–8.1)

## 2021-05-12 LAB — CBC WITH DIFFERENTIAL (CANCER CENTER ONLY)
Abs Immature Granulocytes: 0.11 10*3/uL — ABNORMAL HIGH (ref 0.00–0.07)
Basophils Absolute: 0 10*3/uL (ref 0.0–0.1)
Basophils Relative: 1 %
Eosinophils Absolute: 0.3 10*3/uL (ref 0.0–0.5)
Eosinophils Relative: 5 %
HCT: 33.8 % — ABNORMAL LOW (ref 36.0–46.0)
Hemoglobin: 10.6 g/dL — ABNORMAL LOW (ref 12.0–15.0)
Immature Granulocytes: 2 %
Lymphocytes Relative: 20 %
Lymphs Abs: 1.1 10*3/uL (ref 0.7–4.0)
MCH: 27.6 pg (ref 26.0–34.0)
MCHC: 31.4 g/dL (ref 30.0–36.0)
MCV: 88 fL (ref 80.0–100.0)
Monocytes Absolute: 0.5 10*3/uL (ref 0.1–1.0)
Monocytes Relative: 8 %
Neutro Abs: 3.7 10*3/uL (ref 1.7–7.7)
Neutrophils Relative %: 64 %
Platelet Count: 177 10*3/uL (ref 150–400)
RBC: 3.84 MIL/uL — ABNORMAL LOW (ref 3.87–5.11)
RDW: 18.3 % — ABNORMAL HIGH (ref 11.5–15.5)
WBC Count: 5.8 10*3/uL (ref 4.0–10.5)
nRBC: 0 % (ref 0.0–0.2)

## 2021-05-12 MED ORDER — HEPARIN SOD (PORK) LOCK FLUSH 100 UNIT/ML IV SOLN
500.0000 [IU] | Freq: Once | INTRAVENOUS | Status: AC
  Administered 2021-05-12: 500 [IU]
  Filled 2021-05-12: qty 5

## 2021-05-12 MED ORDER — SODIUM CHLORIDE 0.9% FLUSH
10.0000 mL | Freq: Once | INTRAVENOUS | Status: AC
  Administered 2021-05-12: 10 mL
  Filled 2021-05-12: qty 10

## 2021-05-12 NOTE — Progress Notes (Signed)
Wendy Hoffman OFFICE PROGRESS NOTE   Diagnosis: Pancreas cancer  INTERVAL HISTORY:   Wendy Hoffman returns as scheduled.  She completed another cycle of FOLFIRINOX 04/27/2021.  She denies nausea/vomiting.  No mouth sores.  No diarrhea.  She has mild persistent cold sensitivity.  No numbness or tingling in the absence of cold exposure.  Abdominal pain continues to be improved.  She denies bone pain following Udenyca.  Objective:  Vital signs in last 24 hours:  Blood pressure (!) 135/98, pulse 98, temperature 97.8 F (36.6 C), temperature source Oral, resp. rate 18, height '5\' 4"'  (1.626 m), weight 229 lb (103.9 kg), SpO2 96 %.    HEENT: No thrush or ulcers. Resp: Lungs clear bilaterally. Cardio: Regular rate and rhythm. GI: Abdomen soft and nontender.  No hepatomegaly. Vascular: No leg edema. Neuro: Vibratory sense mildly decreased over the fingertips per tuning fork exam. Skin: Palms without erythema. Port-A-Cath without erythema.   Lab Results:  Lab Results  Component Value Date   WBC 5.8 05/12/2021   HGB 10.6 (L) 05/12/2021   HCT 33.8 (L) 05/12/2021   MCV 88.0 05/12/2021   PLT 177 05/12/2021   NEUTROABS 3.7 05/12/2021    Imaging:  No results found.  Medications: I have reviewed the patient's current medications.  Assessment/Plan: 1.Pancreas cancer -CT abdomen/pelvis without contrast 02/16/2021-multiple large hypoechoic masses in the patient's liver consistent metastatic disease, ill-defined masslike area in the pancreatic head measuring approximate 4.7 cm, possible filling defect in the right ventricle. 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 -Flexible sigmoidoscopy 02/19/2021-diverticulosis in the sigmoid colon, nonbleeding external and internal  hemorrhoids -Cardiac CT 02/20/2021-mass in the mid RV attached to the interventricular septum measuring 16 mm x 6 mm and concerning for metastatic tumor -MRI abdomen with/without contrast 02/21/2021-mass in the posterior pancreatic head measuring 4.3 x 3.8 cm with obstruction of the central pancreatic duct and diffuse dilatation up to 6 mm consistent with pancreatic adenocarcinoma, liver lesions the largest mass of the central left lobe measuring 9.9 x 9.2 cm consistent with hepatic metastatic disease, hepatomegaly. -Ultrasound-guided biopsy of a left liver lesion 02/24/2021-adenocarcinoma, morphologically similar to the duodenal biopsy; microsatellite stable, tumor mutation burden 1 -Negative genetic testing -Palliative radiation to the duodenal mass 02/25/2021-03/06/2021 -Cycle 1 FOLFOX 03/17/2021 -Cycle 2 FOLFOX 04/01/2021 -Cycle 3 FOLFIRINOX 04/14/2021 -Cycle 4 FOLFIRINOX 04/27/2021, Udenyca added -Cycle 5 FOLFIRINOX 05/12/2021, Udenyca 2. Iron deficiency anemia-improved -Feraheme 510 mg 02/20/2021 3. Protein calorie malnutrition 4. Possible right ventricular filling defect on noncontrast CT scan  MRI cardiac morphology 02/21/2021-mass in the mid RV attached to the interventricular septum measures 16 mm x 6 mm. Mass appears heterogeneous with area of enhancement on LGE imaging. Mass is concerning for metastatic tumor. Normal LV size and systolic function. Normal RV size and systolic function. 5. Hypertension 6. History of CVA 7. Hyperlipidemia 8. Hypothyroidism 9. Morbid obesity 10. Asthma 11. Migraines 12.Pain secondary to #1 13.Port-A-Cath placement 02/25/2021 14.Hypokalemia 04/01/2021-started a potassium supplement  Disposition: Wendy Hoffman appears stable.  She has completed 4 cycles of systemic therapy, 2 cycles FOLFOX and 2 cycles FOLFIRINOX.  She overall is tolerating treatment well.  Plan to proceed with cycle 5 FOLFIRINOX as scheduled 05/13/2021.  Restaging abdomen MRI  before next office visit.  We reviewed the CBC from today.  Counts adequate to proceed with treatment.  She will return for lab  and follow-up in 2 weeks.  She will contact the office in the interim with any problems.    [name]   05/12/2021  8:55 AM

## 2021-05-12 NOTE — Patient Instructions (Signed)

## 2021-05-13 ENCOUNTER — Inpatient Hospital Stay: Payer: Medicaid Other | Attending: Nurse Practitioner

## 2021-05-13 ENCOUNTER — Other Ambulatory Visit: Payer: Self-pay | Admitting: Nurse Practitioner

## 2021-05-13 DIAGNOSIS — E876 Hypokalemia: Secondary | ICD-10-CM | POA: Diagnosis not present

## 2021-05-13 DIAGNOSIS — D509 Iron deficiency anemia, unspecified: Secondary | ICD-10-CM | POA: Insufficient documentation

## 2021-05-13 DIAGNOSIS — Z79899 Other long term (current) drug therapy: Secondary | ICD-10-CM | POA: Diagnosis not present

## 2021-05-13 DIAGNOSIS — C250 Malignant neoplasm of head of pancreas: Secondary | ICD-10-CM | POA: Diagnosis present

## 2021-05-13 DIAGNOSIS — G893 Neoplasm related pain (acute) (chronic): Secondary | ICD-10-CM | POA: Diagnosis not present

## 2021-05-13 DIAGNOSIS — Z5189 Encounter for other specified aftercare: Secondary | ICD-10-CM | POA: Insufficient documentation

## 2021-05-13 DIAGNOSIS — I10 Essential (primary) hypertension: Secondary | ICD-10-CM | POA: Diagnosis not present

## 2021-05-13 DIAGNOSIS — C787 Secondary malignant neoplasm of liver and intrahepatic bile duct: Secondary | ICD-10-CM | POA: Diagnosis present

## 2021-05-13 DIAGNOSIS — Z5111 Encounter for antineoplastic chemotherapy: Secondary | ICD-10-CM | POA: Diagnosis not present

## 2021-05-13 DIAGNOSIS — E039 Hypothyroidism, unspecified: Secondary | ICD-10-CM | POA: Insufficient documentation

## 2021-05-13 DIAGNOSIS — Z8673 Personal history of transient ischemic attack (TIA), and cerebral infarction without residual deficits: Secondary | ICD-10-CM | POA: Insufficient documentation

## 2021-05-13 LAB — CANCER ANTIGEN 19-9: CA 19-9: 20302 U/mL — ABNORMAL HIGH (ref 0–35)

## 2021-05-13 MED ORDER — FAMOTIDINE 20 MG IN NS 100 ML IVPB
20.0000 mg | Freq: Once | INTRAVENOUS | Status: AC
  Administered 2021-05-13: 20 mg via INTRAVENOUS
  Filled 2021-05-13: qty 100

## 2021-05-13 MED ORDER — PALONOSETRON HCL INJECTION 0.25 MG/5ML
0.2500 mg | Freq: Once | INTRAVENOUS | Status: AC
  Administered 2021-05-13: 0.25 mg via INTRAVENOUS
  Filled 2021-05-13: qty 5

## 2021-05-13 MED ORDER — DIPHENHYDRAMINE HCL 50 MG/ML IJ SOLN
25.0000 mg | Freq: Once | INTRAMUSCULAR | Status: AC
  Administered 2021-05-13: 25 mg via INTRAVENOUS
  Filled 2021-05-13: qty 1

## 2021-05-13 MED ORDER — DEXTROSE 5 % IV SOLN
Freq: Once | INTRAVENOUS | Status: AC
  Filled 2021-05-13: qty 250

## 2021-05-13 MED ORDER — ATROPINE SULFATE 1 MG/ML IJ SOLN
0.5000 mg | Freq: Once | INTRAMUSCULAR | Status: AC
Start: 2021-05-13 — End: 2021-05-13
  Administered 2021-05-13: 0.5 mg via INTRAVENOUS

## 2021-05-13 MED ORDER — SODIUM CHLORIDE 0.9 % IV SOLN
10.0000 mg | Freq: Once | INTRAVENOUS | Status: AC
  Administered 2021-05-13: 10 mg via INTRAVENOUS
  Filled 2021-05-13: qty 1

## 2021-05-13 MED ORDER — SODIUM CHLORIDE 0.9 % IV SOLN
400.0000 mg/m2 | Freq: Once | INTRAVENOUS | Status: AC
  Administered 2021-05-13: 884 mg via INTRAVENOUS
  Filled 2021-05-13: qty 44.2

## 2021-05-13 MED ORDER — SODIUM CHLORIDE 0.9 % IV SOLN
2000.0000 mg/m2 | INTRAVENOUS | Status: DC
  Administered 2021-05-13: 4400 mg via INTRAVENOUS
  Filled 2021-05-13: qty 88

## 2021-05-13 MED ORDER — POTASSIUM CHLORIDE ER 10 MEQ PO CPCR: 20.0000 meq | ORAL_CAPSULE | Freq: Two times a day (BID) | ORAL | 0 refills | Status: DC

## 2021-05-13 MED ORDER — SODIUM CHLORIDE 0.9 % IV SOLN
150.0000 mg/m2 | Freq: Once | INTRAVENOUS | Status: AC
  Administered 2021-05-13: 340 mg via INTRAVENOUS
  Filled 2021-05-13: qty 17

## 2021-05-13 MED ORDER — OXALIPLATIN CHEMO INJECTION 100 MG/20ML
65.0000 mg/m2 | Freq: Once | INTRAVENOUS | Status: AC
  Administered 2021-05-13: 145 mg via INTRAVENOUS
  Filled 2021-05-13: qty 29

## 2021-05-13 MED ORDER — ATROPINE SULFATE 1 MG/ML IJ SOLN
0.5000 mg | Freq: Once | INTRAMUSCULAR | Status: AC
Start: 2021-05-13 — End: 2021-05-13
  Administered 2021-05-13: 0.5 mg via INTRAVENOUS
  Filled 2021-05-13: qty 1

## 2021-05-13 MED ORDER — SODIUM CHLORIDE 0.9 % IV SOLN
150.0000 mg | Freq: Once | INTRAVENOUS | Status: AC
  Administered 2021-05-13: 150 mg via INTRAVENOUS
  Filled 2021-05-13: qty 5

## 2021-05-13 NOTE — Patient Instructions (Signed)
[facility]  Discharge Instructions: Thank you for choosing [facility] to provide your oncology and hematology care.   If you have a lab appointment with the [facility], please go directly to the [facility] and check in at the registration area.   Wear comfortable clothing and clothing appropriate for easy access to any Portacath or PICC line.   We strive to give you quality time with your provider. You may need to reschedule your appointment if you arrive late (15 or more minutes).  Arriving late affects you and other patients whose appointments are after yours.  Also, if you miss three or more appointments without notifying the office, you may be dismissed from the clinic at the provider's discretion.      For prescription refill requests, have your pharmacy contact our office and allow 72 hours for refills to be completed.    Today you received the following chemotherapy and/or immunotherapy agents oxiliplatin, leucovorin, irinotecan, 5 FU    To help prevent nausea and vomiting after your treatment, we encourage you to take your nausea medication as directed.  BELOW ARE SYMPTOMS THAT SHOULD BE REPORTED IMMEDIATELY: . *FEVER GREATER THAN 100.4 F (38 C) OR HIGHER . *CHILLS OR SWEATING . *NAUSEA AND VOMITING THAT IS NOT CONTROLLED WITH YOUR NAUSEA MEDICATION . *UNUSUAL SHORTNESS OF BREATH . *UNUSUAL BRUISING OR BLEEDING . *URINARY PROBLEMS (pain or burning when urinating, or frequent urination) . *BOWEL PROBLEMS (unusual diarrhea, constipation, pain near the anus) . TENDERNESS IN MOUTH AND THROAT WITH OR WITHOUT PRESENCE OF ULCERS (sore throat, sores in mouth, or a toothache) . UNUSUAL RASH, SWELLING OR PAIN  . UNUSUAL VAGINAL DISCHARGE OR ITCHING   Items with * indicate a potential emergency and should be followed up as soon as possible or go to the Emergency Department if any problems should occur.  Please show the CHEMOTHERAPY ALERT  CARD or IMMUNOTHERAPY ALERT CARD at check-in to the Emergency Department and triage nurse.  Should you have questions after your visit or need to cancel or reschedule your appointment, please contact [facility]  Dept: [phone]  and follow the prompts.  Office hours are 8:00 a.m. to 4:30 p.m. Monday - Friday. Please note that voicemails left after 4:00 p.m. may not be returned until the following business day.  We are closed weekends and major holidays. You have access to a nurse at all times for urgent questions. Please call the main number to the clinic Dept: [phone] and follow the prompts.   For any non-urgent questions, you may also contact your provider using MyChart. We now offer e-Visits for anyone 74 and older to request care online for non-urgent symptoms. For details visit [email or web address].   Also download the MyChart app! Go to the app store, search "MyChart", open the app, select [facility], and log in with your MyChart username and password.  Due to Covid, a mask is required upon entering the hospital/clinic. If you do not have a mask, one will be given to you upon arrival. For doctor visits, patients may have 1 support person aged 28 or older with them. For treatment visits, patients cannot have anyone with them due to current Covid guidelines and our immunocompromised population.   Oxaliplatin Injection O que  este medicamento? A OXALIPLATINA  um medicamento quimioterpico. Este  um medicamento dirigido, que age contra as clulas que se dividem rapidamente, como as clulas cancerosas, causando a morte destas clulas. [employer]  medicamento  usado para tratar o cncer colorretal e vrios outros tipos de cncer. Este medicamento pode ser usado para outros propsitos; em caso de dvidas, pergunte ao seu profissional de sade ou farmacutico. NOMES DE MARCAS COMUNS: Eloxatin O que devo dizer a meu profissional de sade antes de tomar este  medicamento? Precisam saber se voc tem algum dos seguintes problemas ou estados de sade:  doenas cardacas  histria pregressa de batimento cardaco irregular  doenas hepticas  contagens sanguneas baixas, como contagem baixa de glbulos brancos, plaquetas ou glbulos vermelhos  doenas pulmonares ou respiratrias (por exemplo, asma)  est tomando medicamentos para tratar ou prevenir cogulos (trombose)  formigamento nos dedos das mos ou dos ps ou outro distrbio dos nervos  reao estranha ou alergia  oxaliplatina ou a outros quimioterpicos  reao Community education officer ou alergia a outros medicamentos, alimentos, corantes ou conservantes  est grvida ou tentando Hospital doctor  est amamentando Como devo usar este medicamento? Este medicamento  para infuso intravenosa.  administrado em ambiente hospitalar ou em consultrio por um profissional de sade devidamente treinado. Fale com seu pediatra a respeito do uso deste medicamento em crianas. Pode ser preciso tomar alguns cuidados especiais. Superdosagem: Se achar que tomou uma superdosagem deste medicamento, entre em contato imediatamente com o [facility] ou v a [employer]. OBSERVAO: Este medicamento  s para voc. No compartilhe este medicamento com outras pessoas. E se eu deixar de tomar uma dose?  importante que voc no perca nenhuma dose. Se no puder comparecer a uma consulta, entre em contato com seu mdico ou profissional de sade. O que pode interagir com este medicamento? No tome este medicamento com nenhum dos seguintes:  cisaprida  dronedarona  pimozida  tioridazina Este medicamento tambm pode interagir com os seguintes remdios:  aspirina e medicamentos similares  alguns medicamentos que tratam ou previnem a trombose, como varfarina, apixabana, dabigatrana ou rivaroxabana  cisplatina  ciclosporina  diurticos  alguns medicamentos para infeco, como aciclovir,  adefovir, anfotericina B, bacitracina, cidofovir, foscarnete, ganciclovir, gentamicina, pentamidina, vancomicina  AINEs, medicamentos analgsicos e anti-inflamatrios, como ibuprofeno, naproxeno  outros medicamentos que prolongam o intervalo QT (um ritmo cardaco anormal)  pamidronato  cido zoledrnico Esta lista pode no descrever todas as interaes possveis. D ao seu profissional de sade uma lista de todos os medicamentos, ervas medicinais, remdios de venda livre, ou suplementos alimentares que voc [place]. Diga tambm se voc fuma, bebe, ou [place] drogas ilcitas. Alguns destes podem interagir com o seu medicamento. Ao que devo ficar atento quando estiver [employer] medicamento? Voc ser monitorado(a) atentamente enquanto estiver [employer]. Voc precisar fazer exames de sangue peridicos enquanto estiver [employer]. Este medicamento pode Copy. Isso no  nada fora do normal, pois a quimioterapia pode afetar as clulas saudveis alm das clulas cancerosas. Relate qualquer efeito colateral. Continue o tratamento mesmo quando estiver se sentindo doente, a menos que seu mdico ou profissional de sade lhe mande parar. Este medicamento pode lhe deixar mais sensvel ao frio. No tome bebidas geladas nem use gelo. Cubra qualquer rea de pele exposta antes de entrar em contato com temperaturas baixas ou objetos frios. Ao sair no frio, use roupas quentes e cubra a boca e o nariz para aquecer o ar inalado. Se ficar sensvel ao frio, avise seu mdico. No engravide enquanto estiver tomando este medicamento nem por 9 meses aps parar de tom-lo. Mulheres que queiram engravidar ou que acreditem que possam estar grvidas devem avisar ao seu profissional  de sade. Homens no devem engravidar suas parceiras enquanto tomarem este medicamento e por 6 meses depois de parar o tratamento. H risco de graves efeitos colaterais no feto. Para mais informaes,  fale com seu mdico ou profissional de sade. No amamente enquanto estiver tomando este medicamento nem por 3 mses aps parar de tom-lo. Este medicamento causou falncia ovariana em algumas mulheres. Este medicamento pode dificultar uma gravidez. Converse com seu mdico ou profissional de sade se estiver preocupado com a sua fertilidade. Este medicamento causou contagem espermtica baixa em alguns homens. Isto pode interferir com a sua fertilidade. [place]verse com seu mdico ou profissional de sade se estiver preocupado com a sua fertilidade. Este medicamento pode aumentar o risco de pegar infeces. Se tiver febre, calafrios, dor de garganta ou outros sintomas de resfriado ou gripe, pea orientao ao seu mdico ou profissional de sade. No se automedique. Evite ficar perto de pessoas doentes. Evite tomar medicamentos que contenham aspirina, paracetamol (acetaminofeno), ibuprofeno, naproxeno ou cetoprofeno, salvo instruo em contrrio do seu mdico ou profissional de sade. Estes medicamentos podem esconder uma febre. Tome cuidado ao escovar ou palitar os dentes e ao usar fio dental, pois pode pegar uma infeco ou sangrar mais facilmente. Se tiver que passar por algum procedimento odontolgico, avise a seu dentista que est [employer]. Que efeitos colaterais posso sentir aps usar este medicamento? Efeitos colaterais que devem ser informados ao seu mdico ou profissional de sade o mais rpido possvel:  reaes alrgicas, como erupo na pele, coceira, urticria, ou inchao do rosto, dos lbios ou da lngua  dificuldade para respirar  tosse  diminuio na contagem de clulas sanguneas - este medicamento pode diminuir o nmero de glbulos brancos, glbulos vermelhos e plaquetas. Isso pode aumentar o risco de infeces e sangramentos.  enjoo ou vmitos  dor, vermelhido, coceira ou irritao no local da injeo  dor, dormncia ou formigamento nas mos ou ps  sinais ou  sintomas de hemorragia, como fezes sanguinolentas ou negras com aspecto de piche; urina vermelha ou marrom escura; tosse com sangue ou com grumos marrons com aspecto de borra de caf; manchas vermelhas na pele; e hematomas ou sangramentos fora do comum nos olhos, gengivas ou nariz  sinais e sintomas de uma alterao perigosa no batimento ou ritmo cardaco, tais como dor no peito; tonturas; batimento cardaco acelerado, irregular; palpitaes; sensao de desmaio ou tonturas; quedas  sinais e sintomas de infeco, tais como febre; calafrios; tosse; dor de garganta; dor ou dificuldade para urinar  sinais e sintomas de leso heptica, tais como urina amarela escura ou marrom; TEFL teacher geral ou sintomas de gripe; fezes de cor clara; perda de apetite; nuseas; dor no lado superior direito da barriga; cansao ou fraqueza fora do comum; amarelecimento dos olhos ou da pele  sinais e sintomas de contagem baixa de glbulos vermelhos ou anemia, tais como fraqueza ou cansao incomuns; sensao de desmaio ou tonturas; quedas  sinais e sintomas de leso muscular, tais como urina escura; dificuldade para urinar ou alteraes na quantidade de urina; fraqueza ou cansao fora do comum; dor muscular; dor nas costas Efeitos colaterais que normalmente no precisam de cuidados mdicos (avise ao seu mdico ou profissional de sade se persistirem ou forem incmodos):  alterao no paladar  diarreia  gases  queda de cabelo  perda de apetite  aftas Esta lista pode no descrever todos os efeitos colaterais possveis. Para mais orientaes sobre efeitos colaterais, consulte o seu mdico. Voc pode relatar a ocorrncia de efeitos colaterais  FDA pelo  telefone [phone]. Onde devo guardar meu medicamento? Este medicamento  administrado por um profissional da sade no hospital ou em consultrio. Voc no receber este medicamento para conservao em casa. OBSERVAO: Este folheto  um resumo. Pode no cobrir  todas as informaes possveis. Se tiver dvidas a respeito deste medicamento, fale com seu mdico, farmacutico ou profissional de sade.  2021 Elsevier/Gold Standard (2020-04-03 00:00:00)  Leucovorin injection What is this medicine? LEUCOVORIN (loo koe VOR in) is used to prevent or treat the harmful effects of some medicines. This medicine is used to treat anemia caused by a low amount of folic acid in the body. It is also used with 5-fluorouracil (5-FU) to treat colon cancer. This medicine may be used for other purposes; ask your health care provider or pharmacist if you have questions. What should I tell my health care provider before I take this medicine? They need to know if you have any of these conditions:  anemia from low levels of vitamin B-12 in the blood  an unusual or allergic reaction to leucovorin, folic acid, other medicines, foods, dyes, or preservatives  pregnant or trying to get pregnant  breast-feeding How should I use this medicine? This medicine is for injection into a muscle or into a vein. It is given by a health care professional in a hospital or clinic setting. Talk to your pediatrician regarding the use of this medicine in children. Special care may be needed. Overdosage: If you think you have taken too much of this medicine contact a poison control center or emergency room at once. NOTE: This medicine is only for you. Do not share this medicine with others. What if I miss a dose? This does not apply. What may interact with this medicine?  capecitabine  fluorouracil  phenobarbital  phenytoin  primidone  trimethoprim-sulfamethoxazole This list may not describe all possible interactions. Give your health care provider a list of all the medicines, herbs, non-prescription drugs, or dietary supplements you use. Also tell them if you smoke, drink alcohol, or use illegal drugs. Some items may interact with your medicine. What should I watch for while using  this medicine? Your condition will be monitored carefully while you are receiving this medicine. This medicine may increase the side effects of 5-fluorouracil, 5-FU. Tell your doctor or health care professional if you have diarrhea or mouth sores that do not get better or that get worse. What side effects may I notice from receiving this medicine? Side effects that you should report to your doctor or health care professional as soon as possible:  allergic reactions like skin rash, itching or hives, swelling of the face, lips, or tongue  breathing problems  fever, infection  mouth sores  unusual bleeding or bruising  unusually weak or tired Side effects that usually do not require medical attention (report to your doctor or health care professional if they continue or are bothersome):  constipation or diarrhea  loss of appetite  nausea, vomiting This list may not describe all possible side effects. Call your doctor for medical advice about side effects. You may report side effects to FDA at 1-800-FDA-1088. Where should I keep my medicine? This drug is given in a hospital or clinic and will not be stored at home. NOTE: This sheet is a summary. It may not cover all possible information. If you have questions about this medicine, talk to your doctor, pharmacist, or health care provider.  2021 Elsevier/Gold Standard (2008-06-04 16:50:29)   Irinotecan injection What is this medicine?  IRINOTECAN (ir in oh TEE kan ) is a chemotherapy drug. It is used to treat colon and rectal cancer. This medicine may be used for other purposes; ask your health care provider or pharmacist if you have questions. COMMON BRAND NAME(S): Camptosar What should I tell my health care provider before I take this medicine? They need to know if you have any of these conditions:  dehydration  diarrhea  infection (especially a virus infection such as chickenpox, cold sores, or herpes)  liver disease  low  blood counts, like low white cell, platelet, or red cell counts  low levels of calcium, magnesium, or potassium in the blood  recent or ongoing radiation therapy  an unusual or allergic reaction to irinotecan, other medicines, foods, dyes, or preservatives  pregnant or trying to get pregnant  breast-feeding How should I use this medicine? This drug is given as an infusion into a vein. It is administered in a hospital or clinic by a specially trained health care professional. Talk to your pediatrician regarding the use of this medicine in children. Special care may be needed. Overdosage: If you think you have taken too much of this medicine contact a poison control center or emergency room at once. NOTE: This medicine is only for you. Do not share this medicine with others. What if I miss a dose? It is important not to miss your dose. Call your doctor or health care professional if you are unable to keep an appointment. What may interact with this medicine? Do not take this medicine with any of the following medications:  cobicistat  itraconazole This medicine may interact with the following medications:  antiviral medicines for HIV or AIDS  certain antibiotics like rifampin or rifabutin  certain medicines for fungal infections like ketoconazole, posaconazole, and voriconazole  certain medicines for seizures like carbamazepine, phenobarbital, phenotoin  clarithromycin  gemfibrozil  nefazodone  St. John's Wort This list may not describe all possible interactions. Give your health care provider a list of all the medicines, herbs, non-prescription drugs, or dietary supplements you use. Also tell them if you smoke, drink alcohol, or use illegal drugs. Some items may interact with your medicine. What should I watch for while using this medicine? Your condition will be monitored carefully while you are receiving this medicine. You will need important blood work done while you are  taking this medicine. This drug may make you feel generally unwell. This is not uncommon, as chemotherapy can affect healthy cells as well as cancer cells. Report any side effects. Continue your course of treatment even though you feel ill unless your doctor tells you to stop. In some cases, you may be given additional medicines to help with side effects. Follow all directions for their use. You may get drowsy or dizzy. Do not drive, use machinery, or do anything that needs mental alertness until you know how this medicine affects you. Do not stand or sit up quickly, especially if you are an older patient. This reduces the risk of dizzy or fainting spells. Call your health care professional for advice if you get a fever, chills, or sore throat, or other symptoms of a cold or flu. Do not treat yourself. This medicine decreases your body's ability to fight infections. Try to avoid being around people who are sick. Avoid taking products that contain aspirin, acetaminophen, ibuprofen, naproxen, or ketoprofen unless instructed by your doctor. These medicines may hide a fever. This medicine may increase your risk to bruise or bleed. Call  your doctor or health care professional if you notice any unusual bleeding. Be careful brushing and flossing your teeth or using a toothpick because you may get an infection or bleed more easily. If you have any dental work done, tell your dentist you are receiving this medicine. Do not become pregnant while taking this medicine or for 6 months after stopping it. Women should inform their health care professional if they wish to become pregnant or think they might be pregnant. Men should not father a child while taking this medicine and for 3 months after stopping it. There is potential for serious side effects to an unborn child. Talk to your health care professional for more information. Do not breast-feed an infant while taking this medicine or for 7 days after stopping  it. This medicine has caused ovarian failure in some women. This medicine may make it more difficult to get pregnant. Talk to your health care professional if you are concerned about your fertility. This medicine has caused decreased sperm counts in some men. This may make it more difficult to father a child. Talk to your health care professional if you are concerned about your fertility. What side effects may I notice from receiving this medicine? Side effects that you should report to your doctor or health care professional as soon as possible:  allergic reactions like skin rash, itching or hives, swelling of the face, lips, or tongue  chest pain  diarrhea  flushing, runny nose, sweating during infusion  low blood counts - this medicine may decrease the number of white blood cells, red blood cells and platelets. You may be at increased risk for infections and bleeding.  nausea, vomiting  pain, swelling, warmth in the leg  signs of decreased platelets or bleeding - bruising, pinpoint red spots on the skin, black, tarry stools, blood in the urine  signs of infection - fever or chills, cough, sore throat, pain or difficulty passing urine  signs of decreased red blood cells - unusually weak or tired, fainting spells, lightheadedness Side effects that usually do not require medical attention (report to your doctor or health care professional if they continue or are bothersome):  constipation  hair loss  headache  loss of appetite  mouth sores  stomach pain This list may not describe all possible side effects. Call your doctor for medical advice about side effects. You may report side effects to FDA at 1-800-FDA-1088. Where should I keep my medicine? This drug is given in a hospital or clinic and will not be stored at home. NOTE: This sheet is a summary. It may not cover all possible information. If you have questions about this medicine, talk to your doctor, pharmacist, or  health care provider.  2021 Elsevier/Gold Standard (2019-10-30 17:46:13)  Fluorouracil, 5-FU injection What is this medicine? FLUOROURACIL, 5-FU (flure oh YOOR a sil) is a chemotherapy drug. It slows the growth of cancer cells. This medicine is used to treat many types of cancer like breast cancer, colon or rectal cancer, pancreatic cancer, and stomach cancer. This medicine may be used for other purposes; ask your health care provider or pharmacist if you have questions. COMMON BRAND NAME(S): Adrucil What should I tell my health care provider before I take this medicine? They need to know if you have any of these conditions:  blood disorders  dihydropyrimidine dehydrogenase (DPD) deficiency  infection (especially a virus infection such as chickenpox, cold sores, or herpes)  kidney disease  liver disease  malnourished, poor nutrition  recent or ongoing radiation therapy  an unusual or allergic reaction to fluorouracil, other chemotherapy, other medicines, foods, dyes, or preservatives  pregnant or trying to get pregnant  breast-feeding How should I use this medicine? This drug is given as an infusion or injection into a vein. It is administered in a hospital or clinic by a specially trained health care professional. Talk to your pediatrician regarding the use of this medicine in children. Special care may be needed. Overdosage: If you think you have taken too much of this medicine contact a poison control center or emergency room at once. NOTE: This medicine is only for you. Do not share this medicine with others. What if I miss a dose? It is important not to miss your dose. Call your doctor or health care professional if you are unable to keep an appointment. What may interact with this medicine? Do not take this medicine with any of the following medications:  live virus vaccines This medicine may also interact with the following medications:  medicines that treat or  prevent blood clots like warfarin, enoxaparin, and dalteparin This list may not describe all possible interactions. Give your health care provider a list of all the medicines, herbs, non-prescription drugs, or dietary supplements you use. Also tell them if you smoke, drink alcohol, or use illegal drugs. Some items may interact with your medicine. What should I watch for while using this medicine? Visit your doctor for checks on your progress. This drug may make you feel generally unwell. This is not uncommon, as chemotherapy can affect healthy cells as well as cancer cells. Report any side effects. Continue your course of treatment even though you feel ill unless your doctor tells you to stop. In some cases, you may be given additional medicines to help with side effects. Follow all directions for their use. Call your doctor or health care professional for advice if you get a fever, chills or sore throat, or other symptoms of a cold or flu. Do not treat yourself. This drug decreases your body's ability to fight infections. Try to avoid being around people who are sick. This medicine may increase your risk to bruise or bleed. Call your doctor or health care professional if you notice any unusual bleeding. Be careful brushing and flossing your teeth or using a toothpick because you may get an infection or bleed more easily. If you have any dental work done, tell your dentist you are receiving this medicine. Avoid taking products that contain aspirin, acetaminophen, ibuprofen, naproxen, or ketoprofen unless instructed by your doctor. These medicines may hide a fever. Do not become pregnant while taking this medicine. Women should inform their doctor if they wish to become pregnant or think they might be pregnant. There is a potential for serious side effects to an unborn child. Talk to your health care professional or pharmacist for more information. Do not breast-feed an infant while taking this  medicine. Men should inform their doctor if they wish to father a child. This medicine may lower sperm counts. Do not treat diarrhea with over the counter products. Contact your doctor if you have diarrhea that lasts more than 2 days or if it is severe and watery. This medicine can make you more sensitive to the sun. Keep out of the sun. If you cannot avoid being in the sun, wear protective clothing and use sunscreen. Do not use sun lamps or tanning beds/booths. What side effects may I notice from receiving this medicine? Side effects that  you should report to your doctor or health care professional as soon as possible:  allergic reactions like skin rash, itching or hives, swelling of the face, lips, or tongue  low blood counts - this medicine may decrease the number of white blood cells, red blood cells and platelets. You may be at increased risk for infections and bleeding.  signs of infection - fever or chills, cough, sore throat, pain or difficulty passing urine  signs of decreased platelets or bleeding - bruising, pinpoint red spots on the skin, black, tarry stools, blood in the urine  signs of decreased red blood cells - unusually weak or tired, fainting spells, lightheadedness  breathing problems  changes in vision  chest pain  mouth sores  nausea and vomiting  pain, swelling, redness at site where injected  pain, tingling, numbness in the hands or feet  redness, swelling, or sores on hands or feet  stomach pain  unusual bleeding Side effects that usually do not require medical attention (report to your doctor or health care professional if they continue or are bothersome):  changes in finger or toe nails  diarrhea  dry or itchy skin  hair loss  headache  loss of appetite  sensitivity of eyes to the light  stomach upset  unusually teary eyes This list may not describe all possible side effects. Call your doctor for medical advice about side effects. You  may report side effects to FDA at 1-800-FDA-1088. Where should I keep my medicine? This drug is given in a hospital or clinic and will not be stored at home. NOTE: This sheet is a summary. It may not cover all possible information. If you have questions about this medicine, talk to your doctor, pharmacist, or health care provider.  2021 Elsevier/Gold Standard (2019-10-30 15:00:03)

## 2021-05-13 NOTE — Progress Notes (Signed)
Pt c/o starting to feel cramping allover and in abdomen. Ned Card CRNP aware. Orders received. Med, given. emotional support given.

## 2021-05-13 NOTE — Progress Notes (Signed)
Restaging MRI abdomen scheduled for 05/21/21. Patient notified of date/time/prep.

## 2021-05-13 NOTE — Progress Notes (Signed)
Ned Card CRNP aware of k of 2.9-  Pt discussing had time swallowing the big 20 meq tabs and not taking them regularly.  Educated on importance of compliance. Discussed with provider and will get 10 meq tabs for patient.  Pt verbalized understanding

## 2021-05-15 ENCOUNTER — Other Ambulatory Visit: Payer: Self-pay

## 2021-05-15 ENCOUNTER — Ambulatory Visit: Payer: Self-pay

## 2021-05-15 ENCOUNTER — Inpatient Hospital Stay: Payer: Medicaid Other

## 2021-05-15 VITALS — BP 148/94 | HR 79 | Temp 98.2°F | Resp 18

## 2021-05-15 DIAGNOSIS — C250 Malignant neoplasm of head of pancreas: Secondary | ICD-10-CM

## 2021-05-15 DIAGNOSIS — Z5111 Encounter for antineoplastic chemotherapy: Secondary | ICD-10-CM | POA: Diagnosis not present

## 2021-05-15 MED ORDER — SODIUM CHLORIDE 0.9% FLUSH
10.0000 mL | INTRAVENOUS | Status: DC | PRN
  Administered 2021-05-15: 10 mL
  Filled 2021-05-15: qty 10

## 2021-05-15 MED ORDER — HEPARIN SOD (PORK) LOCK FLUSH 100 UNIT/ML IV SOLN
500.0000 [IU] | Freq: Once | INTRAVENOUS | Status: AC | PRN
  Administered 2021-05-15: 500 [IU]
  Filled 2021-05-15: qty 5

## 2021-05-15 MED ORDER — PEGFILGRASTIM-CBQV 6 MG/0.6ML ~~LOC~~ SOSY
6.0000 mg | PREFILLED_SYRINGE | Freq: Once | SUBCUTANEOUS | Status: AC
Start: 2021-05-15 — End: 2021-05-15
  Administered 2021-05-15: 6 mg via SUBCUTANEOUS

## 2021-05-15 NOTE — Patient Instructions (Signed)
Pegfilgrastim injection What is this medicine? PEGFILGRASTIM (PEG fil gra stim) is a long-acting granulocyte colony-stimulating factor that stimulates the growth of neutrophils, a type of white blood cell important in the body's fight against infection. It is used to reduce the incidence of fever and infection in patients with certain types of cancer who are receiving chemotherapy that affects the bone marrow, and to increase survival after being exposed to high doses of radiation. This medicine may be used for other purposes; ask your health care provider or pharmacist if you have questions. COMMON BRAND NAME(S): Fulphila, Neulasta, Nyvepria, UDENYCA, Ziextenzo What should I tell my health care provider before I take this medicine? They need to know if you have any of these conditions:  kidney disease  latex allergy  ongoing radiation therapy  sickle cell disease  skin reactions to acrylic adhesives (On-Body Injector only)  an unusual or allergic reaction to pegfilgrastim, filgrastim, other medicines, foods, dyes, or preservatives  pregnant or trying to get pregnant  breast-feeding How should I use this medicine? This medicine is for injection under the skin. If you get this medicine at home, you will be taught how to prepare and give the pre-filled syringe or how to use the On-body Injector. Refer to the patient Instructions for Use for detailed instructions. Use exactly as directed. Tell your healthcare provider immediately if you suspect that the On-body Injector may not have performed as intended or if you suspect the use of the On-body Injector resulted in a missed or partial dose. It is important that you put your used needles and syringes in a special sharps container. Do not put them in a trash can. If you do not have a sharps container, call your pharmacist or healthcare provider to get one. Talk to your pediatrician regarding the use of this medicine in children. While this drug  may be prescribed for selected conditions, precautions do apply. Overdosage: If you think you have taken too much of this medicine contact a poison control center or emergency room at once. NOTE: This medicine is only for you. Do not share this medicine with others. What if I miss a dose? It is important not to miss your dose. Call your doctor or health care professional if you miss your dose. If you miss a dose due to an On-body Injector failure or leakage, a new dose should be administered as soon as possible using a single prefilled syringe for manual use. What may interact with this medicine? Interactions have not been studied. This list may not describe all possible interactions. Give your health care provider a list of all the medicines, herbs, non-prescription drugs, or dietary supplements you use. Also tell them if you smoke, drink alcohol, or use illegal drugs. Some items may interact with your medicine. What should I watch for while using this medicine? Your condition will be monitored carefully while you are receiving this medicine. You may need blood work done while you are taking this medicine. Talk to your health care provider about your risk of cancer. You may be more at risk for certain types of cancer if you take this medicine. If you are going to need a MRI, CT scan, or other procedure, tell your doctor that you are using this medicine (On-Body Injector only). What side effects may I notice from receiving this medicine? Side effects that you should report to your doctor or health care professional as soon as possible:  allergic reactions (skin rash, itching or hives, swelling of   the face, lips, or tongue)  back pain  dizziness  fever  pain, redness, or irritation at site where injected  pinpoint red spots on the skin  red or dark-brown urine  shortness of breath or breathing problems  stomach or side pain, or pain at the shoulder  swelling  tiredness  trouble  passing urine or change in the amount of urine  unusual bruising or bleeding Side effects that usually do not require medical attention (report to your doctor or health care professional if they continue or are bothersome):  bone pain  muscle pain This list may not describe all possible side effects. Call your doctor for medical advice about side effects. You may report side effects to FDA at 1-800-FDA-1088. Where should I keep my medicine? Keep out of the reach of children. If you are using this medicine at home, you will be instructed on how to store it. Throw away any unused medicine after the expiration date on the label. NOTE: This sheet is a summary. It may not cover all possible information. If you have questions about this medicine, talk to your doctor, pharmacist, or health care provider.  2021 Elsevier/Gold Standard (2019-12-21 13:20:51)  

## 2021-05-18 ENCOUNTER — Telehealth: Payer: Self-pay

## 2021-05-18 ENCOUNTER — Ambulatory Visit
Admission: RE | Admit: 2021-05-18 | Discharge: 2021-05-18 | Disposition: A | Discharge status: Home / Self Care | Payer: Medicaid Other | Source: Ambulatory Visit | Attending: Radiation Oncology | Admitting: Radiation Oncology

## 2021-05-18 ENCOUNTER — Other Ambulatory Visit: Payer: Self-pay

## 2021-05-18 DIAGNOSIS — R112 Nausea with vomiting, unspecified: Secondary | ICD-10-CM

## 2021-05-18 DIAGNOSIS — R110 Nausea: Secondary | ICD-10-CM

## 2021-05-18 DIAGNOSIS — C250 Malignant neoplasm of head of pancreas: Secondary | ICD-10-CM | POA: Insufficient documentation

## 2021-05-18 MED ORDER — SODIUM CHLORIDE 0.9 % IV SOLN
INTRAVENOUS | Status: AC
Start: 2021-05-19 — End: 2021-05-19
  Filled 2021-05-18: qty 250

## 2021-05-18 NOTE — Progress Notes (Signed)
  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 During treatment she did very well with radiotherapy and did not have significant desquamation.    Impression/Plan: 1. 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 I was unable to reach the patient today but I left her a voicemail and on the message, I discussed that we would be happy to continue to follow her as needed, but she will also continue to follow up with Dr. Learta Codding in medical oncology.      Carola Rhine, PAC

## 2021-05-18 NOTE — Progress Notes (Unsigned)
norm

## 2021-05-18 NOTE — Telephone Encounter (Signed)
TC from Pt stating she is having nausea and vomiting since Friday. Pt stated she can't keep any food down. Schedule message sent for Pt to come in tomorrow for IV fluids and antiemetic.

## 2021-05-19 ENCOUNTER — Inpatient Hospital Stay: Payer: Medicaid Other

## 2021-05-19 ENCOUNTER — Other Ambulatory Visit: Payer: Self-pay

## 2021-05-19 VITALS — BP 139/92 | HR 76 | Temp 98.2°F | Resp 18

## 2021-05-19 DIAGNOSIS — Z95828 Presence of other vascular implants and grafts: Secondary | ICD-10-CM

## 2021-05-19 DIAGNOSIS — R112 Nausea with vomiting, unspecified: Secondary | ICD-10-CM

## 2021-05-19 DIAGNOSIS — Z5111 Encounter for antineoplastic chemotherapy: Secondary | ICD-10-CM | POA: Diagnosis not present

## 2021-05-19 DIAGNOSIS — R110 Nausea: Secondary | ICD-10-CM

## 2021-05-19 LAB — CMP (CANCER CENTER ONLY)
ALT: 26 U/L (ref 0–44)
AST: 44 U/L — ABNORMAL HIGH (ref 15–41)
Albumin: 3.6 g/dL (ref 3.5–5.0)
Alkaline Phosphatase: 123 U/L (ref 38–126)
Anion gap: 9 (ref 5–15)
BUN: 8 mg/dL (ref 6–20)
CO2: 31 mmol/L (ref 22–32)
Calcium: 8.7 mg/dL — ABNORMAL LOW (ref 8.9–10.3)
Chloride: 104 mmol/L (ref 98–111)
Creatinine: 0.66 mg/dL (ref 0.44–1.00)
GFR, Estimated: >60 mL/min (ref >60)
Glucose, Bld: 99 mg/dL (ref 70–99)
Potassium: 3.6 mmol/L (ref 3.5–5.1)
Sodium: 144 mmol/L (ref 135–145)
Total Bilirubin: 0.5 mg/dL (ref 0.3–1.2)
Total Protein: 6.1 g/dL — ABNORMAL LOW (ref 6.5–8.1)

## 2021-05-19 LAB — CBC WITH DIFFERENTIAL (CANCER CENTER ONLY)
Abs Immature Granulocytes: 0.09 10*3/uL — ABNORMAL HIGH (ref 0.00–0.07)
Basophils Absolute: 0.1 10*3/uL (ref 0.0–0.1)
Basophils Relative: 1 %
Eosinophils Absolute: 0.2 10*3/uL (ref 0.0–0.5)
Eosinophils Relative: 2 %
HCT: 34.4 % — ABNORMAL LOW (ref 36.0–46.0)
Hemoglobin: 10.9 g/dL — ABNORMAL LOW (ref 12.0–15.0)
Immature Granulocytes: 1 %
Lymphocytes Relative: 9 %
Lymphs Abs: 1 10*3/uL (ref 0.7–4.0)
MCH: 28.5 pg (ref 26.0–34.0)
MCHC: 31.7 g/dL (ref 30.0–36.0)
MCV: 89.8 fL (ref 80.0–100.0)
Monocytes Absolute: 0.4 10*3/uL (ref 0.1–1.0)
Monocytes Relative: 4 %
Neutro Abs: 9.5 10*3/uL — ABNORMAL HIGH (ref 1.7–7.7)
Neutrophils Relative %: 83 %
Platelet Count: 184 10*3/uL (ref 150–400)
RBC: 3.83 MIL/uL — ABNORMAL LOW (ref 3.87–5.11)
RDW: 17.4 % — ABNORMAL HIGH (ref 11.5–15.5)
WBC Count: 11.3 10*3/uL — ABNORMAL HIGH (ref 4.0–10.5)
nRBC: 0 % (ref 0.0–0.2)

## 2021-05-19 MED ORDER — DEXAMETHASONE SODIUM PHOSPHATE 100 MG/10ML IJ SOLN
10.0000 mg | Freq: Once | INTRAMUSCULAR | Status: AC
Start: 2021-05-19 — End: 2021-05-19
  Administered 2021-05-19: 10 mg via INTRAVENOUS
  Filled 2021-05-19: qty 1

## 2021-05-19 MED ORDER — SODIUM CHLORIDE 0.9 % IV SOLN
Freq: Once | INTRAVENOUS | Status: AC
Start: 2021-05-19 — End: 2021-05-19
  Filled 2021-05-19: qty 250

## 2021-05-19 MED ORDER — SODIUM CHLORIDE 0.9% FLUSH
10.0000 mL | Freq: Once | INTRAVENOUS | Status: AC
  Administered 2021-05-19: 10 mL via INTRAVENOUS
  Filled 2021-05-19: qty 10

## 2021-05-19 MED ORDER — HEPARIN SOD (PORK) LOCK FLUSH 100 UNIT/ML IV SOLN
500.0000 [IU] | Freq: Once | INTRAVENOUS | Status: AC
  Administered 2021-05-19: 500 [IU] via INTRAVENOUS
  Filled 2021-05-19: qty 5

## 2021-05-19 NOTE — Patient Instructions (Signed)
Rehydration, Adult Rehydration is the replacement of body fluids, salts, and minerals (electrolytes) that are lost during dehydration. Dehydration is when there is not enough water or other fluids in the body. This happens when you lose more fluids than you take in. Common causes of dehydration include:  Not drinking enough fluids. This can occur when you are ill or doing activities that require a lot of energy, especially in hot weather.  Conditions that cause loss of water or other fluids, such as diarrhea, vomiting, sweating, or urinating a lot.  Other illnesses, such as fever or infection.  Certain medicines, such as those that remove excess fluid from the body (diuretics). Symptoms of mild or moderate dehydration may include thirst, dry lips and mouth, and dizziness. Symptoms of severe dehydration may include increased heart rate, confusion, fainting, and not urinating. For severe dehydration, you may need to get fluids through an IV at the hospital. For mild or moderate dehydration, you can usually rehydrate at home by drinking certain fluids as told by your health care provider. What are the risks? Generally, rehydration is safe. However, taking in too much fluid (overhydration) can be a problem. This is rare. Overhydration can cause an electrolyte imbalance, kidney failure, or a decrease in salt (sodium) levels in the body. Supplies needed You will need an oral rehydration solution (ORS) if your health care provider tells you to use one. This is a drink to treat dehydration. It can be found in pharmacies and retail stores. How to rehydrate Fluids Follow instructions from your health care provider for rehydration. The kind of fluid and the amount you should drink depend on your condition. In general, you should choose drinks that you prefer.  If told by your health care provider, drink an ORS. ? Make an ORS by following instructions on the package. ? Start by drinking small amounts,  about  cup (120 mL) every 5-10 minutes. ? Slowly increase how much you drink until you have taken the amount recommended by your health care provider.  Drink enough clear fluids to keep your urine pale yellow. If you were told to drink an ORS, finish it first, then start slowly drinking other clear fluids. Drink fluids such as: ? Water. This includes sparkling water and flavored water. Drinking only water can lead to having too little sodium in your body (hyponatremia). Follow the advice of your health care provider. ? Water from ice chips you suck on. ? Fruit juice with water you add to it (diluted). ? Sports drinks. ? Hot or cold herbal teas. ? Broth-based soups. ? Milk or milk products. Food Follow instructions from your health care provider about what to eat while you rehydrate. Your health care provider may recommend that you slowly begin eating regular foods in small amounts.  Eat foods that contain a healthy balance of electrolytes, such as bananas, oranges, potatoes, tomatoes, and spinach.  Avoid foods that are greasy or contain a lot of sugar. In some cases, you may get nutrition through a feeding tube that is passed through your nose and into your stomach (nasogastric tube, or NG tube). This may be done if you have uncontrolled vomiting or diarrhea.   Beverages to avoid Certain beverages may make dehydration worse. While you rehydrate, avoid drinking alcohol.   How to tell if you are recovering from dehydration You may be recovering from dehydration if:  You are urinating more often than before you started rehydrating.  Your urine is pale yellow.  Your energy level   improves.  You vomit less frequently.  You have diarrhea less frequently.  Your appetite improves or returns to normal.  You feel less dizzy or less light-headed.  Your skin tone and color start to look more normal. Follow these instructions at home:  Take over-the-counter and prescription medicines only  as told by your health care provider.  Do not take sodium tablets. Doing this can lead to having too much sodium in your body (hypernatremia). Contact a health care provider if:  You continue to have symptoms of mild or moderate dehydration, such as: ? Thirst. ? Dry lips. ? Slightly dry mouth. ? Dizziness. ? Dark urine or less urine than normal. ? Muscle cramps.  You continue to vomit or have diarrhea. Get help right away if you:  Have symptoms of dehydration that get worse.  Have a fever.  Have a severe headache.  Have been vomiting and the following happens: ? Your vomiting gets worse or does not go away. ? Your vomit includes blood or green matter (bile). ? You cannot eat or drink without vomiting.  Have problems with urination or bowel movements, such as: ? Diarrhea that gets worse or does not go away. ? Blood in your stool (feces). This may cause stool to look black and tarry. ? Not urinating, or urinating only a small amount of very dark urine, within 6-8 hours.  Have trouble breathing.  Have symptoms that get worse with treatment. These symptoms may represent a serious problem that is an emergency. Do not wait to see if the symptoms will go away. Get medical help right away. Call your local emergency services (911 in the [place] Do not drive yourself to the hospital. Summary  Rehydration is the replacement of body fluids and minerals (electrolytes) that are lost during dehydration.  Follow instructions from your health care provider for rehydration. The kind of fluid and amount you should drink depend on your condition.  Slowly increase how much you drink until you have taken the amount recommended by your health care provider.  Contact your health care provider if you continue to show signs of mild or moderate dehydration. This information is not intended to replace advice given to you by your health care provider. Make sure you discuss any questions you have with  your health care provider. Document Revised: 01/30/2020 Document Reviewed: 12/10/2019 Elsevier Patient Education  2021 [employer]  

## 2021-05-19 NOTE — Patient Instructions (Signed)

## 2021-05-20 ENCOUNTER — Other Ambulatory Visit: Payer: Self-pay | Admitting: Nurse Practitioner

## 2021-05-20 DIAGNOSIS — C250 Malignant neoplasm of head of pancreas: Secondary | ICD-10-CM

## 2021-05-20 MED ORDER — MORPHINE SULFATE ER 30 MG PO TBCR: 30.0000 mg | EXTENDED_RELEASE_TABLET | Freq: Two times a day (BID) | ORAL | 0 refills | Status: DC

## 2021-05-21 ENCOUNTER — Encounter (HOSPITAL_COMMUNITY): Payer: Self-pay

## 2021-05-21 ENCOUNTER — Ambulatory Visit (HOSPITAL_COMMUNITY)
Admission: RE | Admit: 2021-05-21 | Discharge: 2021-05-21 | Disposition: A | Discharge status: Home / Self Care | Payer: Self-pay | Source: Ambulatory Visit | Attending: Nurse Practitioner | Admitting: Nurse Practitioner

## 2021-05-21 ENCOUNTER — Other Ambulatory Visit: Payer: Self-pay

## 2021-05-21 ENCOUNTER — Emergency Department (HOSPITAL_COMMUNITY)
Admission: EM | Admit: 2021-05-21 | Discharge: 2021-05-21 | Disposition: A | Discharge status: Home / Self Care | Payer: Medicaid Other | Attending: Emergency Medicine | Admitting: Emergency Medicine

## 2021-05-21 ENCOUNTER — Other Ambulatory Visit: Payer: Self-pay | Admitting: Nurse Practitioner

## 2021-05-21 DIAGNOSIS — Z87891 Personal history of nicotine dependence: Secondary | ICD-10-CM | POA: Diagnosis not present

## 2021-05-21 DIAGNOSIS — Z79899 Other long term (current) drug therapy: Secondary | ICD-10-CM | POA: Insufficient documentation

## 2021-05-21 DIAGNOSIS — Z8541 Personal history of malignant neoplasm of cervix uteri: Secondary | ICD-10-CM | POA: Insufficient documentation

## 2021-05-21 DIAGNOSIS — C250 Malignant neoplasm of head of pancreas: Secondary | ICD-10-CM

## 2021-05-21 DIAGNOSIS — R1010 Upper abdominal pain, unspecified: Secondary | ICD-10-CM | POA: Diagnosis present

## 2021-05-21 DIAGNOSIS — I10 Essential (primary) hypertension: Secondary | ICD-10-CM | POA: Insufficient documentation

## 2021-05-21 DIAGNOSIS — R197 Diarrhea, unspecified: Secondary | ICD-10-CM | POA: Insufficient documentation

## 2021-05-21 DIAGNOSIS — J449 Chronic obstructive pulmonary disease, unspecified: Secondary | ICD-10-CM | POA: Insufficient documentation

## 2021-05-21 DIAGNOSIS — E039 Hypothyroidism, unspecified: Secondary | ICD-10-CM | POA: Insufficient documentation

## 2021-05-21 DIAGNOSIS — R112 Nausea with vomiting, unspecified: Secondary | ICD-10-CM | POA: Diagnosis not present

## 2021-05-21 DIAGNOSIS — J45909 Unspecified asthma, uncomplicated: Secondary | ICD-10-CM | POA: Insufficient documentation

## 2021-05-21 DIAGNOSIS — Z8507 Personal history of malignant neoplasm of pancreas: Secondary | ICD-10-CM | POA: Diagnosis not present

## 2021-05-21 DIAGNOSIS — Z7951 Long term (current) use of inhaled steroids: Secondary | ICD-10-CM | POA: Insufficient documentation

## 2021-05-21 LAB — COMPREHENSIVE METABOLIC PANEL
ALT: 31 U/L (ref 0–44)
AST: 64 U/L — ABNORMAL HIGH (ref 15–41)
Albumin: 3.7 g/dL (ref 3.5–5.0)
Alkaline Phosphatase: 141 U/L — ABNORMAL HIGH (ref 38–126)
Anion gap: 8 (ref 5–15)
BUN: 5 mg/dL — ABNORMAL LOW (ref 6–20)
CO2: 29 mmol/L (ref 22–32)
Calcium: 9.2 mg/dL (ref 8.9–10.3)
Chloride: 104 mmol/L (ref 98–111)
Creatinine, Ser: 0.71 mg/dL (ref 0.44–1.00)
GFR, Estimated: >60 mL/min (ref >60)
Glucose, Bld: 119 mg/dL — ABNORMAL HIGH (ref 70–99)
Potassium: 3.4 mmol/L — ABNORMAL LOW (ref 3.5–5.1)
Sodium: 141 mmol/L (ref 135–145)
Total Bilirubin: 0.5 mg/dL (ref 0.3–1.2)
Total Protein: 6.8 g/dL (ref 6.5–8.1)

## 2021-05-21 LAB — CBC WITH DIFFERENTIAL/PLATELET
Abs Immature Granulocytes: 0.18 10*3/uL — ABNORMAL HIGH (ref 0.00–0.07)
Basophils Absolute: 0.1 10*3/uL (ref 0.0–0.1)
Basophils Relative: 1 %
Eosinophils Absolute: 0.2 10*3/uL (ref 0.0–0.5)
Eosinophils Relative: 2 %
HCT: 34.8 % — ABNORMAL LOW (ref 36.0–46.0)
Hemoglobin: 11.2 g/dL — ABNORMAL LOW (ref 12.0–15.0)
Immature Granulocytes: 2 %
Lymphocytes Relative: 9 %
Lymphs Abs: 1.1 10*3/uL (ref 0.7–4.0)
MCH: 28.9 pg (ref 26.0–34.0)
MCHC: 32.2 g/dL (ref 30.0–36.0)
MCV: 89.7 fL (ref 80.0–100.0)
Monocytes Absolute: 1 10*3/uL (ref 0.1–1.0)
Monocytes Relative: 8 %
Neutro Abs: 9.7 10*3/uL — ABNORMAL HIGH (ref 1.7–7.7)
Neutrophils Relative %: 78 %
Platelets: 169 10*3/uL (ref 150–400)
RBC: 3.88 MIL/uL (ref 3.87–5.11)
RDW: 17 % — ABNORMAL HIGH (ref 11.5–15.5)
WBC: 12.3 10*3/uL — ABNORMAL HIGH (ref 4.0–10.5)
nRBC: 0 % (ref 0.0–0.2)

## 2021-05-21 LAB — URINALYSIS, ROUTINE W REFLEX MICROSCOPIC
Bacteria, UA: NONE SEEN
Bilirubin Urine: NEGATIVE
Glucose, UA: NEGATIVE mg/dL
Ketones, ur: NEGATIVE mg/dL
Leukocytes,Ua: NEGATIVE
Nitrite: NEGATIVE
Protein, ur: NEGATIVE mg/dL
Specific Gravity, Urine: 1.006 (ref 1.005–1.030)
pH: 8 (ref 5.0–8.0)

## 2021-05-21 LAB — LIPASE, BLOOD: Lipase: 20 U/L (ref 11–51)

## 2021-05-21 MED ORDER — MORPHINE SULFATE (PF) 4 MG/ML IV SOLN
4.0000 mg | Freq: Once | INTRAVENOUS | Status: AC
Start: 2021-05-21 — End: 2021-05-21
  Administered 2021-05-21: 4 mg via INTRAVENOUS
  Filled 2021-05-21: qty 1

## 2021-05-21 MED ORDER — MORPHINE SULFATE (PF) 4 MG/ML IV SOLN
4.0000 mg | Freq: Once | INTRAVENOUS | Status: AC
  Administered 2021-05-21: 4 mg via INTRAVENOUS
  Filled 2021-05-21: qty 1

## 2021-05-21 MED ORDER — SODIUM CHLORIDE 0.9 % IV BOLUS
1000.0000 mL | Freq: Once | INTRAVENOUS | Status: AC
  Administered 2021-05-21: 1000 mL via INTRAVENOUS

## 2021-05-21 MED ORDER — GADOBUTROL 1 MMOL/ML IV SOLN: 10.0000 mL | Freq: Once | INTRAVENOUS | Status: DC | PRN

## 2021-05-21 MED ORDER — HEPARIN SOD (PORK) LOCK FLUSH 100 UNIT/ML IV SOLN
500.0000 [IU] | Freq: Once | INTRAVENOUS | Status: AC
  Administered 2021-05-21: 500 [IU]
  Filled 2021-05-21: qty 5

## 2021-05-21 MED ORDER — PROCHLORPERAZINE EDISYLATE 10 MG/2ML IJ SOLN
10.0000 mg | Freq: Once | INTRAMUSCULAR | Status: AC
  Administered 2021-05-21: 10 mg via INTRAVENOUS
  Filled 2021-05-21: qty 2

## 2021-05-21 MED ORDER — ONDANSETRON HCL 4 MG/2ML IJ SOLN
4.0000 mg | Freq: Once | INTRAMUSCULAR | Status: AC
  Administered 2021-05-21: 4 mg via INTRAVENOUS
  Filled 2021-05-21: qty 2

## 2021-05-21 NOTE — Discharge Instructions (Addendum)
Continue using your pain and nausea medication, take medications regularly do not wait until you have started vomiting.  Your MRI has been rescheduled for Saturday.  Please arrive at the main entrance to the hospital at 2:30 PM.  You should not have anything to eat or drink 4 hours before your study.

## 2021-05-21 NOTE — ED Triage Notes (Signed)
Pt arrives from cancer center, unable to get MRCP due to nausea/vomiting. Hx pancreatic cancer. NV started this am, unable to keep down regular morphine, last successful dose 9pm last night. Endorses minimal diarrhea, denise bloody in either. Last treatment last week.

## 2021-05-21 NOTE — ED Provider Notes (Signed)
[facility] DEPT Provider Note   CSN: [id number] Arrival date & time: 05/21/21  0941     History Chief Complaint  Patient presents with   Emesis    Wendy Hoffman is a 55 y.o. female.  Wendy Hoffman is a 55 y.o. female with known pancreatic cancer presents to the ED from [facility], MRI department.  Patient was supposed to have an abdominal MRI today for restaging of pancreatic cancer after recent treatments, but since last night she has been having worsening upper abdominal pain and persistent nausea and vomiting, was unable to keep down her pain medications.  Reports she has had similar issues with pain and nausea throughout her cancer course but because of this she was unable to lie flat for her MRI and so they sent her to the emergency department.  EMS states she had a similar episode on Friday and and was brought into the oncology office for fluids and antiemetics.  She reports that she usually takes Zofran and Compazine at home but could not keep them down.  Reports this upper abdominal pain feels like pain she has had previously.  No fevers or chills.  No blood in her vomit. 1 episode of diarrhea, but no blood in the stool.  Followed by Dr. Learta Codding with oncology. Just completed cycle of FOLFIRINOX.  The history is provided by the patient and medical records.      Past Medical History:  Diagnosis Date   Anemia    Anxiety    Arthritis    Asthma    Cancer ([place])    cervic   H/O: hysterectomy    High cholesterol    Hypertension    Hypothyroidism    Insomnia    Medical history non-contributory    Migraine    Morbid obesity ([place])    Shortness of breath    Stroke [facility])    Thyroid disease     Patient Active Problem List   Diagnosis Date Noted   Genetic testing 03/23/2021   Port-A-Cath in place 03/16/2021   Goals of care, counseling/discussion 03/06/2021   Morbidly obese ([place]) 02/27/2021   Heme positive stool    Iron deficiency anemia  due to chronic blood loss    Epigastric pain    Malignant neoplasm of head of pancreas ([place]) 02/17/2021   Aortic atherosclerosis ([place]) 02/17/2021   GI bleeding 02/16/2021   Class 3 obesity with current BMI at 44.30 kg/m 02/16/2021   COPD (chronic obstructive pulmonary disease) ([place]) 02/16/2021   Symptomatic anemia    Snoring 05/03/2016   Morbid obesity ([place]) 04/25/2014   Asthma exacerbation 04/22/2014   Asthma attack 04/22/2014   Asthma with acute exacerbation 11/03/2013   Hypothyroidism 11/03/2013   Tobacco abuse 11/03/2013   Migraine 09/22/2012   Weakness of right side of body 09/22/2012   Thyroid disease    Hypertension    High cholesterol    H/O: hysterectomy    Asthma 11/26/2008   HEADACHE, CHRONIC 11/26/2008    Past Surgical History:  Procedure Laterality Date   ABDOMINAL HYSTERECTOMY     BIOPSY  02/19/2021   Procedure: BIOPSY;  Surgeon: Mauri Pole, MD;  Location: [facility] ENDOSCOPY;  Service: Endoscopy;;   CESAREAN SECTION     3 occasions   CHOLECYSTECTOMY     ESOPHAGOGASTRODUODENOSCOPY (EGD) WITH PROPOFOL N/A 02/19/2021   Procedure: ESOPHAGOGASTRODUODENOSCOPY (EGD) WITH PROPOFOL;  Surgeon: Mauri Pole, MD;  Location: [facility] ENDOSCOPY;  Service: Endoscopy;  Laterality: N/A;   FLEXIBLE SIGMOIDOSCOPY  N/A 02/19/2021   Procedure: FLEXIBLE SIGMOIDOSCOPY;  Surgeon: Mauri Pole, MD;  Location: [facility] ENDOSCOPY;  Service: Endoscopy;  Laterality: N/A;   IR IMAGING GUIDED PORT INSERTION  02/24/2021   IR US GUIDE BX ASP/DRAIN  02/24/2021   THYROIDECTOMY, PARTIAL     Goiter     OB History   No obstetric history on file.     Family History  Problem Relation Age of Onset   Migraines Sister    Non-Hodgkin's lymphoma Other        maternal half brother's grandson   Non-Hodgkin's lymphoma Half-Brother        maternal half brother; dx 63s or 53s   Pancreatic cancer Other 47       MGF's niece    Social History   Tobacco Use   Smoking status: Former    Pack  years: 0.00    Types: Cigarettes   Smokeless tobacco: Never  Vaping Use   Vaping Use: Never used  Substance Use Topics   Alcohol use: No   Drug use: No    Home Medications Prior to Admission medications   Medication Sig Start Date End Date Taking? Authorizing Provider  albuterol (PROVENTIL) (2.5 MG/3ML) 0.083% nebulizer solution Take 3 mLs (2.5 mg total) by nebulization every 4 (four) hours as needed for wheezing or shortness of breath. Patient not taking: No sig reported 75/1/70   Delora Fuel, MD  albuterol (VENTOLIN HFA) 108 (90 Base) MCG/ACT inhaler INHALE 1 TO 2 PUFFS BY MOUTH EVERY 6 HOURS AS NEEDED FOR WHEEZING AND FOR SHORTNESS OF BREATH 01/06/21 01/06/22  Millsaps, Joelene Millin, NP  amLODipine (NORVASC) 10 MG tablet TAKE 1 TABLET BY MOUTH ONCE DAILY Patient not taking: No sig reported 01/06/21 01/06/22  Millsaps, Joelene Millin, NP  budesonide-formoterol (SYMBICORT) 160-4.5 MCG/ACT inhaler INHALE 2 PUFFS INTO THE LUNGS 2 (TWO) TIMES DAILY. 01/06/21 01/06/22  Millsaps, Joelene Millin, NP  dexlansoprazole (DEXILANT) 60 MG capsule TAKE 1 CAPSULE BY MOUTH ONCE DAILY Patient not taking: No sig reported 01/06/21 01/06/22  Everardo Beals, NP  gabapentin (NEURONTIN) 300 MG capsule TAKE 1 CAPSULE AS NEEDED BY ORAL ROUTE AT BEDTIME. 01/06/21 01/06/22  Millsaps, Joelene Millin, NP  ipratropium (ATROVENT) 0.02 % nebulizer solution Take 0.25 mg by nebulization daily as needed for wheezing. Patient not taking: No sig reported    [provider]  lidocaine-prilocaine (EMLA) cream Apply 1 application topically as needed. 03/06/21   Ladell Pier, MD  magic mouthwash SOLN Take 5-10 mLs by mouth 4 (four) times daily as needed for mouth pain (swish and spit for mouth pain). 04/06/21   Ladell Pier, MD  magnesium oxide (MAG-OX) 400 (240 Mg) MG tablet Take 1 tablet (400 mg total) by mouth daily. 04/27/21   Owens Shark, NP  magnesium oxide (MAG-OX) 400 MG tablet Take 400 mg by mouth daily.    [provider]  methocarbamol (ROBAXIN) 750 MG tablet TAKE 1 TABLET 3 TIMES A DAY BY ORAL ROUTE AS NEEDED FOR 30 DAYS. 01/06/21 01/06/22  Millsaps, Joelene Millin, NP  morphine (MS CONTIN) 30 MG 12 hr tablet Take 1 tablet (30 mg total) by mouth every 12 (twelve) hours. 05/20/21   Owens Shark, NP  ondansetron (ZOFRAN) 4 MG tablet Take 1 tablet (4 mg total) by mouth every 6 (six) hours as needed for nausea. Patient not taking: No sig reported 04/14/21   Owens Shark, NP  oxyCODONE-acetaminophen (PERCOCET) 7.5-325 MG tablet Take 1 tablet by mouth every 4 (four) hours as needed  for severe pain. 04/14/21   Owens Shark, NP  pantoprazole (PROTONIX) 40 MG tablet Take 1 tablet (40 mg total) by mouth 2 (two) times daily. 02/28/21   Allie Bossier, MD  polyethylene glycol (MIRALAX / GLYCOLAX) 17 g packet Take 17 g by mouth daily as needed for moderate constipation. Patient not taking: No sig reported 02/28/21   Allie Bossier, MD  potassium chloride (MICRO-K) 10 MEQ CR capsule Take 2 capsules (20 mEq total) by mouth 2 (two) times daily. Take 2 capsules twice a day for 3 days, then 2 capsules daily 05/13/21   Owens Shark, NP  prochlorperazine (COMPAZINE) 10 MG tablet TAKE 1 TABLET(10 MG) BY MOUTH EVERY 6 HOURS AS NEEDED FOR NAUSEA OR VOMITING 05/04/21   Ladell Pier, MD  rosuvastatin (CRESTOR) 5 MG tablet TAKE 1 TABLET BY MOUTH ONCE DAILY Patient not taking: No sig reported 01/06/21 01/06/22  Everardo Beals, NP  SUMAtriptan (IMITREX) 100 MG tablet Take 100 mg by mouth daily as needed for migraine. Patient not taking: No sig reported    [provider]  SYMBICORT 160-4.5 MCG/ACT inhaler INHALE 2 PUFFS INTO THE LUNGS 2 (TWO) TIMES DAILY. Patient taking differently: Inhale 2 puffs into the lungs 2 (two) times daily. 03/07/20   Charlott Rakes, MD  topiramate (TOPAMAX) 50 MG tablet Take 1 tablet (50 mg total) by mouth 2 (two) times daily. Patient not taking: No sig reported 11/19/19   Charlott Rakes, MD  zolpidem  (AMBIEN) 10 MG tablet Take 1 tablet (10 mg total) by mouth at bedtime as needed for sleep. 04/14/21   Owens Shark, NP  fluticasone (FLONASE) 50 MCG/ACT nasal spray Place 1 spray into both nostrils daily. Patient not taking: Reported on 03/21/2019 12/29/17 06/29/19  Petrucelli, Glynda Jaeger, PA-C    Allergies    Blueberry [vaccinium angustifolium], Mango flavor, Oxaliplatin, Reglan [metoclopramide], Aspirin, and Penicillins  Review of Systems   Review of Systems  HENT: Negative.    Respiratory:  Negative for shortness of breath.   Cardiovascular:  Negative for chest pain.  Skin:  Negative for color change and rash.  All other systems reviewed and are negative.  Physical Exam Updated Vital Signs BP (!) 191/114 (BP Location: Left Arm)   Pulse 78   Temp 97.8 F (36.6 C) (Oral)   Resp (!) 24   Ht 5' 4" (1.626 m)   Wt 103.8 kg   SpO2 100%   BMI 39.28 kg/m   Physical Exam Vitals and nursing note reviewed.  Constitutional:      General: She is not in acute distress.    Appearance: Normal appearance. She is well-developed. She is obese. She is not diaphoretic.     Comments: Chronically ill appearing, but in no acute distress  HENT:     Head: Normocephalic and atraumatic.  Eyes:     General:        Right eye: No discharge.        Left eye: No discharge.     Pupils: Pupils are equal, round, and reactive to light.  Cardiovascular:     Rate and Rhythm: Normal rate and regular rhythm.     Pulses: Normal pulses.     Heart sounds: Normal heart sounds.  Pulmonary:     Effort: Pulmonary effort is normal. No respiratory distress.     Breath sounds: Normal breath sounds. No wheezing or rales.     Comments: Respirations equal and unlabored, patient able to speak in full  sentences, lungs clear to auscultation bilaterally  Abdominal:     General: Bowel sounds are normal. There is no distension.     Palpations: Abdomen is soft. There is no mass.     Tenderness: There is abdominal tenderness.  There is no guarding.     Comments: Abdomen is soft, non-distended, bowel sounds present throughout, generalized tenderness throughout the upper abdomen, no lower abdominal tenderness, no guarding  Musculoskeletal:        General: No deformity.     Cervical back: Neck supple.  Skin:    General: Skin is warm and dry.     Capillary Refill: Capillary refill takes less than 2 seconds.  Neurological:     Mental Status: She is alert and oriented to person, place, and time.     Coordination: Coordination normal.     Comments: Speech is clear, able to follow commands Moves extremities without ataxia, coordination intact  Psychiatric:        Mood and Affect: Mood normal.        Behavior: Behavior normal.    ED Results / Procedures / Treatments   Labs (all labs ordered are listed, but only abnormal results are displayed) Labs Reviewed  COMPREHENSIVE METABOLIC PANEL - Abnormal; Notable for the following components:      Result Value   Potassium 3.4 (*)    Glucose, Bld 119 (*)    BUN 5 (*)    AST 64 (*)    Alkaline Phosphatase 141 (*)    All other components within normal limits  CBC WITH DIFFERENTIAL/PLATELET - Abnormal; Notable for the following components:   WBC 12.3 (*)    Hemoglobin 11.2 (*)    HCT 34.8 (*)    RDW 17.0 (*)    Neutro Abs 9.7 (*)    Abs Immature Granulocytes 0.18 (*)    All other components within normal limits  URINALYSIS, ROUTINE W REFLEX MICROSCOPIC - Abnormal; Notable for the following components:   Color, Urine STRAW (*)    Hgb urine dipstick SMALL (*)    All other components within normal limits  LIPASE, BLOOD    EKG EKG Interpretation  Date/Time:  Thursday May 21 2021 09:53:44 EDT Ventricular Rate:  57 PR Interval:  139 QRS Duration: 105 QT Interval:  473 QTC Calculation: 461 R Axis:   9 Text Interpretation: Sinus rhythm Borderline T abnormalities, anterior leads No significant change since last tracing Confirmed by Fredia Sorrow [id number] on  05/21/2021 10:29:12 AM  Radiology No results found.  Procedures Procedures   Medications Ordered in ED Medications  ondansetron (ZOFRAN) injection 4 mg (4 mg Intravenous Given 05/21/21 1100)  morphine 4 MG/ML injection 4 mg (4 mg Intravenous Given 05/21/21 1100)  sodium chloride 0.9 % bolus 1,000 mL (0 mLs Intravenous Stopped 05/21/21 1302)  morphine 4 MG/ML injection 4 mg (4 mg Intravenous Given 05/21/21 1301)  prochlorperazine (COMPAZINE) injection 10 mg (10 mg Intravenous Given 05/21/21 1301)  morphine 4 MG/ML injection 4 mg (4 mg Intravenous Given 05/21/21 1522)  heparin lock flush 100 unit/mL (500 Units Intracatheter Given 05/21/21 1529)    ED Course  I have reviewed the triage vital signs and the nursing notes.  Pertinent labs & imaging results that were available during my care of the patient were reviewed by me and considered in my medical decision making (see chart for details).    MDM Rules/Calculators/A&P  Patient presents to the ED with complaints of nausea, vomiting and abdominal pain. Sent down from MRI, because she couldn't lay flat for outpatient study due to symptoms. Currently being treated for stge IV pancreatic cancer. Patient nontoxic appearing, in no apparent distress, vitals WNL aside from HTN. On exam patient tender to palpation across the upper abdomen, no peritoneal signs.  She has had similar pain and episodes of nausea vomiting throughout her cancer course.  No fevers.  Will evaluate with labs, will hold off on CT at this time. Analgesics, anti-emetics, and fluids administered.   Additional history obtained:  Additional history obtained from chart review & nursing note review.   Lab Tests:  I Ordered, reviewed, and interpreted labs, which included:  CBC: Mild leukocytosis, similar to lab work from a few days ago, stable hemoglobin CMP: Potassium 3.4, no other significant electrolyte derangements, normal renal function, alk phos of 141, AST of  64, otherwise LFTs unremarkable Lipase: WNL UA: No signs of infection, no hematuria   ED Course:    RE-EVAL: After 2 rounds of pain and nausea medication patient is feeling much better, she is tolerating clear liquids and would like to go home.  Case discussed with Dr. Lorenso Courier with oncology, reassured that patient's symptoms have improved here in the ED, reports it would be ideal for patient to go ahead and get MRI that she was scheduled for, but if she is unable to get it from the ED will need to be rescheduled.  I discussed with MRI department, they would not be able to get her until late this evening today but can reschedule her for tomorrow evening or anytime Saturday.  Discussed with patient and she will return for MRI Saturday afternoon, know she will need to be n.p.o. for 4 hours prior to imaging.  Patient is feeling much better and would like to go home.  Discharged home in good conditions.   Portions of this note were generated with [employer]. Dictation errors may occur despite best attempts at proofreading.     Final Clinical Impression(s) / ED Diagnoses Final diagnoses:  Non-intractable vomiting with nausea, unspecified vomiting type  Pain of upper abdomen    Rx / DC Orders ED Discharge Orders     None        Jacqlyn Larsen, [place] 05/21/21 1637    Fredia Sorrow, MD 05/27/21 [phone]

## 2021-05-21 NOTE — ED Triage Notes (Signed)
Pt endorses upper abdominal pain.

## 2021-05-23 ENCOUNTER — Emergency Department (HOSPITAL_COMMUNITY)
Admission: EM | Admit: 2021-05-23 | Discharge: 2021-05-23 | Disposition: A | Discharge status: Home / Self Care | Payer: Medicaid Other | Source: Home / Self Care | Attending: Nurse Practitioner | Admitting: Nurse Practitioner

## 2021-05-23 MED ORDER — GADOBUTROL 1 MMOL/ML IV SOLN
10.0000 mL | Freq: Once | INTRAVENOUS | Status: AC | PRN
  Administered 2021-05-23: 10 mL via INTRAVENOUS

## 2021-05-24 ENCOUNTER — Other Ambulatory Visit: Payer: Self-pay | Admitting: Oncology

## 2021-05-26 ENCOUNTER — Telehealth: Payer: Self-pay | Admitting: Oncology

## 2021-05-26 ENCOUNTER — Inpatient Hospital Stay (HOSPITAL_BASED_OUTPATIENT_CLINIC_OR_DEPARTMENT_OTHER): Payer: Medicaid Other | Admitting: Oncology

## 2021-05-26 ENCOUNTER — Other Ambulatory Visit: Payer: Self-pay

## 2021-05-26 ENCOUNTER — Inpatient Hospital Stay: Payer: Medicaid Other

## 2021-05-26 ENCOUNTER — Telehealth: Payer: Self-pay

## 2021-05-26 ENCOUNTER — Encounter: Payer: Self-pay | Admitting: Oncology

## 2021-05-26 VITALS — BP 150/90 | HR 74 | Temp 97.8°F | Resp 18 | Ht 64.0 in | Wt 214.5 lb

## 2021-05-26 DIAGNOSIS — C250 Malignant neoplasm of head of pancreas: Secondary | ICD-10-CM

## 2021-05-26 DIAGNOSIS — Z5111 Encounter for antineoplastic chemotherapy: Secondary | ICD-10-CM | POA: Diagnosis not present

## 2021-05-26 DIAGNOSIS — Z95828 Presence of other vascular implants and grafts: Secondary | ICD-10-CM

## 2021-05-26 LAB — CBC WITH DIFFERENTIAL (CANCER CENTER ONLY)
Abs Immature Granulocytes: 0.21 10*3/uL — ABNORMAL HIGH (ref 0.00–0.07)
Basophils Absolute: 0.1 10*3/uL (ref 0.0–0.1)
Basophils Relative: 1 %
Eosinophils Absolute: 0.3 10*3/uL (ref 0.0–0.5)
Eosinophils Relative: 4 %
HCT: 39.1 % (ref 36.0–46.0)
Hemoglobin: 12.5 g/dL (ref 12.0–15.0)
Immature Granulocytes: 3 %
Lymphocytes Relative: 13 %
Lymphs Abs: 1 10*3/uL (ref 0.7–4.0)
MCH: 28.7 pg (ref 26.0–34.0)
MCHC: 32 g/dL (ref 30.0–36.0)
MCV: 89.7 fL (ref 80.0–100.0)
Monocytes Absolute: 0.6 10*3/uL (ref 0.1–1.0)
Monocytes Relative: 8 %
Neutro Abs: 5.4 10*3/uL (ref 1.7–7.7)
Neutrophils Relative %: 71 %
Platelet Count: 278 10*3/uL (ref 150–400)
RBC: 4.36 MIL/uL (ref 3.87–5.11)
RDW: 17.4 % — ABNORMAL HIGH (ref 11.5–15.5)
WBC Count: 7.5 10*3/uL (ref 4.0–10.5)
nRBC: 0 % (ref 0.0–0.2)

## 2021-05-26 LAB — CMP (CANCER CENTER ONLY)
ALT: 18 U/L (ref 0–44)
AST: 28 U/L (ref 15–41)
Albumin: 3.7 g/dL (ref 3.5–5.0)
Alkaline Phosphatase: 122 U/L (ref 38–126)
Anion gap: 10 (ref 5–15)
BUN: 8 mg/dL (ref 6–20)
CO2: 29 mmol/L (ref 22–32)
Calcium: 9.4 mg/dL (ref 8.9–10.3)
Chloride: 102 mmol/L (ref 98–111)
Creatinine: 0.71 mg/dL (ref 0.44–1.00)
GFR, Estimated: >60 mL/min (ref >60)
Glucose, Bld: 99 mg/dL (ref 70–99)
Potassium: 3.2 mmol/L — ABNORMAL LOW (ref 3.5–5.1)
Sodium: 141 mmol/L (ref 135–145)
Total Bilirubin: 0.5 mg/dL (ref 0.3–1.2)
Total Protein: 6.7 g/dL (ref 6.5–8.1)

## 2021-05-26 MED ORDER — HEPARIN SOD (PORK) LOCK FLUSH 100 UNIT/ML IV SOLN
500.0000 [IU] | Freq: Once | INTRAVENOUS | Status: AC
Start: 2021-05-26 — End: 2021-05-26
  Administered 2021-05-26: 500 [IU]
  Filled 2021-05-26: qty 5

## 2021-05-26 MED ORDER — SODIUM CHLORIDE 0.9% FLUSH
10.0000 mL | Freq: Once | INTRAVENOUS | Status: AC
  Administered 2021-05-26: 10 mL
  Filled 2021-05-26: qty 10

## 2021-05-26 MED ORDER — OXYCODONE-ACETAMINOPHEN 7.5-325 MG PO TABS: 1.0000 | ORAL_TABLET | ORAL | 0 refills | Status: DC | PRN

## 2021-05-26 NOTE — Patient Instructions (Signed)
Implanted Port Home Guide An implanted port is a device that is placed under the skin. It is usually placed in the chest. The device can be used to give IV medicine, to take blood, or for dialysis. You may have an implanted port if: You need IV medicine that would be irritating to the small veins in your hands or arms. You need IV medicines, such as antibiotics, for a long period of time. You need IV nutrition for a long period of time. You need dialysis. When you have a port, your health care provider can choose to use the port instead of veins in your arms for these procedures. You may have fewer limitations when using a port than you would if you used other types of long-term IVs, and you will likely be able to return to normal activities afteryour incision heals. An implanted port has two main parts: Reservoir. The reservoir is the part where a needle is inserted to give medicines or draw blood. The reservoir is round. After it is placed, it appears as a small, raised area under your skin. Catheter. The catheter is a thin, flexible tube that connects the reservoir to a vein. Medicine that is inserted into the reservoir goes into the catheter and then into the vein. How is my port accessed? To access your port: A numbing cream may be placed on the skin over the port site. Your health care provider will put on a mask and sterile gloves. The skin over your port will be cleaned carefully with a germ-killing soap and allowed to dry. Your health care provider will gently pinch the port and insert a needle into it. Your health care provider will check for a blood return to make sure the port is in the vein and is not clogged. If your port needs to remain accessed to get medicine continuously (constant infusion), your health care provider will place a clear bandage (dressing) over the needle site. The dressing and needle will need to be changed every week, or as told by your health care provider. What  is flushing? Flushing helps keep the port from getting clogged. Follow instructions from your health care provider about how and when to flush the port. Ports are usually flushed with saline solution or a medicine called heparin. The need for flushing will depend on how the port is used: If the port is only used from time to time to give medicines or draw blood, the port may need to be flushed: Before and after medicines have been given. Before and after blood has been drawn. As part of routine maintenance. Flushing may be recommended every 4-6 weeks. If a constant infusion is running, the port may not need to be flushed. Throw away any syringes in a disposal container that is meant for sharp items (sharps container). You can buy a sharps container from a pharmacy, or you can make one by using an empty hard plastic bottle with a cover. How long will my port stay implanted? The port can stay in for as long as your health care provider thinks it is needed. When it is time for the port to come out, a surgery will be done to remove it. The surgery will be similar to the procedure that was done to putthe port in. Follow these instructions at home:  Flush your port as told by your health care provider. If you need an infusion over several days, follow instructions from your health care provider about how to take   care of your port site. Make sure you: Wash your hands with soap and water before you change your dressing. If soap and water are not available, use alcohol-based hand sanitizer. Change your dressing as told by your health care provider. Place any used dressings or infusion bags into a plastic bag. Throw that bag in the trash. Keep the dressing that covers the needle clean and dry. Do not get it wet. Do not use scissors or sharp objects near the tube. Keep the tube clamped, unless it is being used. Check your port site every day for signs of infection. Check for: Redness, swelling, or  pain. Fluid or blood. Pus or a bad smell. Protect the skin around the port site. Avoid wearing bra straps that rub or irritate the site. Protect the skin around your port from seat belts. Place a soft pad over your chest if needed. Bathe or shower as told by your health care provider. The site may get wet as long as you are not actively receiving an infusion. Return to your normal activities as told by your health care provider. Ask your health care provider what activities are safe for you. Carry a medical alert card or wear a medical alert bracelet at all times. This will let health care providers know that you have an implanted port in case of an emergency. Get help right away if: You have redness, swelling, or pain at the port site. You have fluid or blood coming from your port site. You have pus or a bad smell coming from the port site. You have a fever. Summary Implanted ports are usually placed in the chest for long-term IV access. Follow instructions from your health care provider about flushing the port and changing bandages (dressings). Take care of the area around your port by avoiding clothing that puts pressure on the area, and by watching for signs of infection. Protect the skin around your port from seat belts. Place a soft pad over your chest if needed. Get help right away if you have a fever or you have redness, swelling, pain, drainage, or a bad smell at the port site. This information is not intended to replace advice given to you by your health care provider. Make sure you discuss any questions you have with your healthcare provider. Document Revised: 04/14/2020 Document Reviewed: 04/14/2020 Elsevier Patient Education  2022 [employer]  

## 2021-05-26 NOTE — Progress Notes (Signed)
Wendy Hoffman OFFICE VISIT PROGRESS NOTE  I connected with Wendy Hoffman on 05/26/21 at  8:20 AM EDT by video enabled telemedicine visit and verified that I am speaking with the correct person using two identifiers.   I discussed the limitations, risks, security and privacy concerns of performing an evaluation and management service by telemedicine and the availability of in-person appointments. I also discussed with the patient that there may be a patient responsible charge related to this service. The patient expressed understanding and agreed to proceed.   Patient's location: Office Provider's location: Home   Diagnosis: Pancreas cancer  INTERVAL HISTORY:   Wendy Hoffman completed another cycle of FOLFIRINOX on 05/13/2021.  She had cold sensitivity lasting for several days following chemotherapy.  No other neuropathy symptoms.  She continues to have intermittent nausea and vomiting.  She has difficulty tolerating most foods.  She reports being able to drink sweet tea, some soups, and pudding.  She continues to have abdominal pain.  The pain has increased over the past few weeks.  She was seen in the emergency room with nausea and abdominal pain 05/21/2021.  She received antiemetics and morphine with improvement.  Objective:  Vital signs in last 24 hours:  Blood pressure (!) 150/90, pulse 74, temperature 97.8 F (36.6 C), temperature source Oral, resp. rate 18, height '5\' 4"'  (1.626 m), weight 214 lb 8 oz (97.3 kg), SpO2 100 %.      Lab Results:  Lab Results  Component Value Date   WBC 7.5 05/26/2021   HGB 12.5 05/26/2021   HCT 39.1 05/26/2021   MCV 89.7 05/26/2021   PLT 278 05/26/2021   NEUTROABS 5.4 05/26/2021     Medications: I have reviewed the patient's current medications.  Assessment/Plan: 1. Pancreas cancer -CT abdomen/pelvis without contrast 02/16/2021-multiple large hypoechoic masses in the patient's liver  consistent metastatic disease, ill-defined masslike area in the pancreatic head measuring approximate 4.7 cm, possible filling defect in the right ventricle. -EGD 02/19/2021-large infiltrative mass with bleeding in the second portion of the duodenum, appears to be arising near the ampulla, partially obstructive with friable mucosa.  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 -Flexible sigmoidoscopy 02/19/2021-diverticulosis in the sigmoid colon, nonbleeding external and internal hemorrhoids -Cardiac CT 02/20/2021-mass in the mid RV attached to the interventricular septum measuring 16 mm x 6 mm and concerning for metastatic tumor -MRI abdomen with/without contrast 02/21/2021-mass in the posterior pancreatic head measuring 4.3 x 3.8 cm with obstruction of the central pancreatic duct and diffuse dilatation up to 6 mm consistent with pancreatic adenocarcinoma, liver lesions the largest mass of the central left lobe measuring 9.9 x 9.2 cm consistent with hepatic metastatic disease,  hepatomegaly. -Ultrasound-guided biopsy of a left liver lesion 02/24/2021-adenocarcinoma, morphologically similar to the duodenal biopsy; microsatellite stable, tumor mutation burden 1 -Negative genetic testing -Palliative radiation to the duodenal mass 02/25/2021-03/06/2021 -Cycle 1 FOLFOX 03/17/2021 -Cycle 2 FOLFOX 04/01/2021 -Cycle 3 FOLFIRINOX 04/14/2021 -Cycle 4 FOLFIRINOX 04/27/2021, Udenyca added -Cycle 5 FOLFIRINOX 05/12/2021, Udenyca -MRI abdomen 05/23/2021-decrease in size of pancreas mass and hepatic lesions, no progressive disease -Cycle 6 FOLFIRINOX 05/27/2021 2.  Iron deficiency anemia-improved -Feraheme 510 mg 02/20/2021 3.  Protein calorie malnutrition 4.  Possible right ventricular filling defect on noncontrast CT scan MRI cardiac morphology 02/21/2021-mass in the mid RV attached to the interventricular septum measures 16 mm x 6 mm.  Mass appears heterogeneous with area of enhancement on LGE  imaging.  Mass is concerning for metastatic  tumor.  Normal LV size and systolic function.  Normal RV size and systolic function. 5.  Hypertension 6.  History of CVA 7.  Hyperlipidemia 8.  Hypothyroidism 9.  Morbid obesity 10.  Asthma 11.  Migraines 12.  Pain secondary to #1 13.  Port-A-Cath placement 02/25/2021 14.  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  She has completed 5 cycles of FOLFIRINOX.  The restaging abdomen MRI feels improvement in the pancreas mass and liver metastases.  No evidence of disease progression.  The plan is to continue FOLFIRINOX.  I reviewed the MRI images with Wendy Hoffman.  The etiology of her persistent nausea is unclear.  This predated chemotherapy.  We will arrange for a nutrition evaluation to optimize her diet.  I am concerned she continues to lose weight.  I recommended she find a nutrition supplement she can tolerate and try to eat 6 times per day.  She will continue the current pain regimen.  Wendy Hoffman will return for the next cycle of chemotherapy on 05/27/2021.  She will then be scheduled for FOLFIRINOX at a 3-week interval.  Hopefully her performance status will improve with seeing out the FOLFIRINOX.   I discussed the assessment and treatment plan with the patient. The patient was provided an opportunity to ask questions and all were answered. The patient agreed with the plan and demonstrated an understanding of the instructions.   The patient was advised to call back or seek an in-person evaluation if the symptoms worsen or if the condition fails to improve as anticipated.  I provided 30 minutes of video, chart review, and documentation time during this encounter, and > 50% was spent counseling as documented under my assessment & plan.  Wendy Hoffman ANP/GNP-BC   05/26/2021 1:31 PM

## 2021-05-26 NOTE — Telephone Encounter (Signed)
Scheduled appointment per 06/14 sch msg. Left message.

## 2021-05-26 NOTE — Telephone Encounter (Signed)
Pt kept accessed for treatment tomorrow.

## 2021-05-27 ENCOUNTER — Inpatient Hospital Stay: Payer: Medicaid Other

## 2021-05-27 VITALS — BP 111/70 | HR 85 | Temp 98.1°F | Resp 18

## 2021-05-27 DIAGNOSIS — Z5111 Encounter for antineoplastic chemotherapy: Secondary | ICD-10-CM | POA: Diagnosis not present

## 2021-05-27 DIAGNOSIS — C250 Malignant neoplasm of head of pancreas: Secondary | ICD-10-CM

## 2021-05-27 LAB — CANCER ANTIGEN 19-9: CA 19-9: 19704 U/mL — ABNORMAL HIGH (ref 0–35)

## 2021-05-27 MED ORDER — DEXAMETHASONE SODIUM PHOSPHATE 100 MG/10ML IJ SOLN
10.0000 mg | Freq: Once | INTRAMUSCULAR | Status: AC
  Administered 2021-05-27: 10 mg via INTRAVENOUS
  Filled 2021-05-27: qty 1

## 2021-05-27 MED ORDER — DIPHENHYDRAMINE HCL 50 MG/ML IJ SOLN
25.0000 mg | Freq: Once | INTRAMUSCULAR | Status: AC
  Administered 2021-05-27: 25 mg via INTRAVENOUS
  Filled 2021-05-27: qty 1

## 2021-05-27 MED ORDER — SODIUM CHLORIDE 0.9 % IV SOLN
150.0000 mg/m2 | Freq: Once | INTRAVENOUS | Status: AC
  Administered 2021-05-27: 320 mg via INTRAVENOUS
  Filled 2021-05-27: qty 16

## 2021-05-27 MED ORDER — ATROPINE SULFATE 1 MG/ML IJ SOLN
0.5000 mg | Freq: Once | INTRAMUSCULAR | Status: AC
  Administered 2021-05-27: 0.5 mg via INTRAVENOUS
  Filled 2021-05-27: qty 1

## 2021-05-27 MED ORDER — DEXTROSE 5 % IV SOLN
Freq: Once | INTRAVENOUS | Status: AC
  Filled 2021-05-27: qty 250

## 2021-05-27 MED ORDER — SODIUM CHLORIDE 0.9 % IV SOLN
400.0000 mg/m2 | Freq: Once | INTRAVENOUS | Status: AC
  Administered 2021-05-27: 840 mg via INTRAVENOUS
  Filled 2021-05-27: qty 42

## 2021-05-27 MED ORDER — SODIUM CHLORIDE 0.9 % IV SOLN
2000.0000 mg/m2 | INTRAVENOUS | Status: DC
  Administered 2021-05-27: 4200 mg via INTRAVENOUS
  Filled 2021-05-27: qty 84

## 2021-05-27 MED ORDER — PALONOSETRON HCL INJECTION 0.25 MG/5ML
0.2500 mg | Freq: Once | INTRAVENOUS | Status: AC
  Administered 2021-05-27: 0.25 mg via INTRAVENOUS
  Filled 2021-05-27: qty 5

## 2021-05-27 MED ORDER — FOSAPREPITANT DIMEGLUMINE INJECTION 150 MG
150.0000 mg | Freq: Once | INTRAVENOUS | Status: AC
  Administered 2021-05-27: 150 mg via INTRAVENOUS
  Filled 2021-05-27: qty 5

## 2021-05-27 MED ORDER — SODIUM CHLORIDE 0.9% FLUSH
10.0000 mL | INTRAVENOUS | Status: DC | PRN
  Administered 2021-05-27: 10 mL
  Filled 2021-05-27: qty 10

## 2021-05-27 MED ORDER — OXALIPLATIN CHEMO INJECTION 100 MG/20ML
65.0000 mg/m2 | Freq: Once | INTRAVENOUS | Status: AC
  Administered 2021-05-27: 135 mg via INTRAVENOUS
  Filled 2021-05-27: qty 27

## 2021-05-27 MED ORDER — FAMOTIDINE 20 MG IN NS 100 ML IVPB
20.0000 mg | Freq: Once | INTRAVENOUS | Status: AC
  Administered 2021-05-27: 20 mg via INTRAVENOUS
  Filled 2021-05-27: qty 100

## 2021-05-27 NOTE — Patient Instructions (Signed)
[facility]  Discharge Instructions: Thank you for choosing [facility] to provide your oncology and hematology care.   If you have a lab appointment with the [facility], please go directly to the [facility] and check in at the registration area.   Wear comfortable clothing and clothing appropriate for easy access to any Portacath or PICC line.   We strive to give you quality time with your provider. You may need to reschedule your appointment if you arrive late (15 or more minutes).  Arriving late affects you and other patients whose appointments are after yours.  Also, if you miss three or more appointments without notifying the office, you may be dismissed from the clinic at the provider's discretion.      For prescription refill requests, have your pharmacy contact our office and allow 72 hours for refills to be completed.    Today you received the following chemotherapy and/or immunotherapy agents oxiliplatin, leucovorin, irinotecan, 5 FU    To help prevent nausea and vomiting after your treatment, we encourage you to take your nausea medication as directed.  BELOW ARE SYMPTOMS THAT SHOULD BE REPORTED IMMEDIATELY: *FEVER GREATER THAN 100.4 F (38 C) OR HIGHER *CHILLS OR SWEATING *NAUSEA AND VOMITING THAT IS NOT CONTROLLED WITH YOUR NAUSEA MEDICATION *UNUSUAL SHORTNESS OF BREATH *UNUSUAL BRUISING OR BLEEDING *URINARY PROBLEMS (pain or burning when urinating, or frequent urination) *BOWEL PROBLEMS (unusual diarrhea, constipation, pain near the anus) TENDERNESS IN MOUTH AND THROAT WITH OR WITHOUT PRESENCE OF ULCERS (sore throat, sores in mouth, or a toothache) UNUSUAL RASH, SWELLING OR PAIN  UNUSUAL VAGINAL DISCHARGE OR ITCHING   Items with * indicate a potential emergency and should be followed up as soon as possible or go to the Emergency Department if any problems should occur.  Please show the CHEMOTHERAPY ALERT CARD or IMMUNOTHERAPY  ALERT CARD at check-in to the Emergency Department and triage nurse.  Should you have questions after your visit or need to cancel or reschedule your appointment, please contact [facility]  Dept: [phone]  and follow the prompts.  Office hours are 8:00 a.m. to 4:30 p.m. Monday - Friday. Please note that voicemails left after 4:00 p.m. may not be returned until the following business day.  We are closed weekends and major holidays. You have access to a nurse at all times for urgent questions. Please call the main number to the clinic Dept: [phone] and follow the prompts.   For any non-urgent questions, you may also contact your provider using MyChart. We now offer e-Visits for anyone 76 and older to request care online for non-urgent symptoms. For details visit [email or web address].   Also download the MyChart app! Go to the app store, search "MyChart", open the app, select [facility], and log in with your MyChart username and password.  Due to Covid, a mask is required upon entering the hospital/clinic. If you do not have a mask, one will be given to you upon arrival. For doctor visits, patients may have 1 support person aged 34 or older with them. For treatment visits, patients cannot have anyone with them due to current Covid guidelines and our immunocompromised population.   Oxaliplatin Injection What is this medication? OXALIPLATIN (ox AL i PLA tin) is a chemotherapy drug. It targets fast dividing cells, like cancer cells, and causes these cells to die. This medicine is usedto treat cancers of the colon and rectum, and many other cancers. This medicine  may be used for other purposes; ask your health care provider orpharmacist if you have questions. COMMON BRAND NAME(S): Eloxatin What should I tell my care team before I take this medication? They need to know if you have any of these conditions: heart disease history of irregular heartbeat liver  disease low blood counts, like white cells, platelets, or red blood cells lung or breathing disease, like asthma take medicines that treat or prevent blood clots tingling of the fingers or toes, or other nerve disorder an unusual or allergic reaction to oxaliplatin, other chemotherapy, other medicines, foods, dyes, or preservatives pregnant or trying to get pregnant breast-feeding How should I use this medication? This drug is given as an infusion into a vein. It is administered in a hospitalor clinic by a specially trained health care professional. Talk to your pediatrician regarding the use of this medicine in children.Special care may be needed. Overdosage: If you think you have taken too much of this medicine contact apoison control center or emergency room at once. NOTE: This medicine is only for you. Do not share this medicine with others. What if I miss a dose? It is important not to miss a dose. Call your doctor or health careprofessional if you are unable to keep an appointment. What may interact with this medication? Do not take this medicine with any of the following medications: cisapride dronedarone pimozide thioridazine This medicine may also interact with the following medications: aspirin and aspirin-like medicines certain medicines that treat or prevent blood clots like warfarin, apixaban, dabigatran, and rivaroxaban cisplatin cyclosporine diuretics medicines for infection like acyclovir, adefovir, amphotericin B, bacitracin, cidofovir, foscarnet, ganciclovir, gentamicin, pentamidine, vancomycin NSAIDs, medicines for pain and inflammation, like ibuprofen or naproxen other medicines that prolong the QT interval (an abnormal heart rhythm) pamidronate zoledronic acid This list may not describe all possible interactions. Give your health care provider a list of all the medicines, herbs, non-prescription drugs, or dietary supplements you use. Also tell them if you smoke,  drink alcohol, or use illegaldrugs. Some items may interact with your medicine. What should I watch for while using this medication? Your condition will be monitored carefully while you are receiving thismedicine. You may need blood work done while you are taking this medicine. This medicine may make you feel generally unwell. This is not uncommon as chemotherapy can affect healthy cells as well as cancer cells. Report any side effects. Continue your course of treatment even though you feel ill unless yourhealthcare professional tells you to stop. This medicine can make you more sensitive to cold. Do not drink cold drinks or use ice. Cover exposed skin before coming in contact with cold temperatures or cold objects. When out in cold weather wear warm clothing and cover your mouth and nose to warm the air that goes into your lungs. Tell your doctor if you getsensitive to the cold. Do not become pregnant while taking this medicine or for 9 months after stopping it. Women should inform their health care professional if they wish to become pregnant or think they might be pregnant. Men should not father a child while taking this medicine and for 6 months after stopping it. There is potential for serious side effects to an unborn child. Talk to your health careprofessional for more information. Do not breast-feed a child while taking this medicine or for 3 months afterstopping it. This medicine has caused ovarian failure in some women. This medicine may make it more difficult to get pregnant. Talk to your health  care professional if Ventura Sellers concerned about your fertility. This medicine has caused decreased sperm counts in some men. This may make it more difficult to father a child. Talk to your health care professional if Ventura Sellers concerned about your fertility. This medicine may increase your risk of getting an infection. Call your health care professional for advice if you get a fever, chills, or sore throat, or  other symptoms of a cold or flu. Do not treat yourself. Try to avoid beingaround people who are sick. Avoid taking medicines that contain aspirin, acetaminophen, ibuprofen, naproxen, or ketoprofen unless instructed by your health care professional.These medicines may hide a fever. Be careful brushing or flossing your teeth or using a toothpick because you may get an infection or bleed more easily. If you have any dental work done, Primary school teacher you are receiving this medicine. What side effects may I notice from receiving this medication? Side effects that you should report to your doctor or health care professionalas soon as possible: allergic reactions like skin rash, itching or hives, swelling of the face, lips, or tongue breathing problems cough low blood counts - this medicine may decrease the number of white blood cells, red blood cells, and platelets. You may be at increased risk for infections and bleeding nausea, vomiting pain, redness, or irritation at site where injected pain, tingling, numbness in the hands or feet signs and symptoms of bleeding such as bloody or black, tarry stools; red or dark brown urine; spitting up blood or brown material that looks like coffee grounds; red spots on the skin; unusual bruising or bleeding from the eyes, gums, or nose signs and symptoms of a dangerous change in heartbeat or heart rhythm like chest pain; dizziness; fast, irregular heartbeat; palpitations; feeling faint or lightheaded; falls signs and symptoms of infection like fever; chills; cough; sore throat; pain or trouble passing urine signs and symptoms of liver injury like dark yellow or brown urine; general ill feeling or flu-like symptoms; light-colored stools; loss of appetite; nausea; right upper belly pain; unusually weak or tired; yellowing of the eyes or skin signs and symptoms of low red blood cells or anemia such as unusually weak or tired; feeling faint or lightheaded; falls signs  and symptoms of muscle injury like dark urine; trouble passing urine or change in the amount of urine; unusually weak or tired; muscle pain; back pain Side effects that usually do not require medical attention (report to yourdoctor or health care professional if they continue or are bothersome): changes in taste diarrhea gas hair loss loss of appetite mouth sores This list may not describe all possible side effects. Call your doctor for medical advice about side effects. You may report side effects to FDA at1-800-FDA-1088. Where should I keep my medication? This drug is given in a hospital or clinic and will not be stored at home. NOTE: This sheet is a summary. It may not cover all possible information. If you have questions about this medicine, talk to your doctor, pharmacist, orhealth care provider.  2022 Elsevier/Gold Standard (2019-04-18 12:20:35)  Irinotecan injection What is this medication? IRINOTECAN (ir in oh TEE kan ) is a chemotherapy drug. It is used to treatcolon and rectal cancer. This medicine may be used for other purposes; ask your health care provider orpharmacist if you have questions. COMMON BRAND NAME(S): Camptosar What should I tell my care team before I take this medication? They need to know if you have any of these conditions: dehydration diarrhea infection (especially a virus  infection such as chickenpox, cold sores, or herpes) liver disease low blood counts, like low white cell, platelet, or red cell counts low levels of calcium, magnesium, or potassium in the blood recent or ongoing radiation therapy an unusual or allergic reaction to irinotecan, other medicines, foods, dyes, or preservatives pregnant or trying to get pregnant breast-feeding How should I use this medication? This drug is given as an infusion into a vein. It is administered in a hospitalor clinic by a specially trained health care professional. Talk to your pediatrician regarding the use  of this medicine in children.Special care may be needed. Overdosage: If you think you have taken too much of this medicine contact apoison control center or emergency room at once. NOTE: This medicine is only for you. Do not share this medicine with others. What if I miss a dose? It is important not to miss your dose. Call your doctor or health careprofessional if you are unable to keep an appointment. What may interact with this medication? Do not take this medicine with any of the following medications: cobicistat itraconazole This medicine may interact with the following medications: antiviral medicines for HIV or AIDS certain antibiotics like rifampin or rifabutin certain medicines for fungal infections like ketoconazole, posaconazole, and voriconazole certain medicines for seizures like carbamazepine, phenobarbital, phenotoin clarithromycin gemfibrozil nefazodone [facility]. John's Wort This list may not describe all possible interactions. Give your health care provider a list of all the medicines, herbs, non-prescription drugs, or dietary supplements you use. Also tell them if you smoke, drink alcohol, or use illegaldrugs. Some items may interact with your medicine. What should I watch for while using this medication? Your condition will be monitored carefully while you are receiving this medicine. You will need important blood work done while you are taking thismedicine. This drug may make you feel generally unwell. This is not uncommon, as chemotherapy can affect healthy cells as well as cancer cells. Report any side effects. Continue your course of treatment even though you feel ill unless yourdoctor tells you to stop. In some cases, you may be given additional medicines to help with side effects.Follow all directions for their use. You may get drowsy or dizzy. Do not drive, use machinery, or do anything that needs mental alertness until you know how this medicine affects you. Do not stand or  sit up quickly, especially if you are an older patient. This reducesthe risk of dizzy or fainting spells. Call your health care professional for advice if you get a fever, chills, or sore throat, or other symptoms of a cold or flu. Do not treat yourself. This medicine decreases your body's ability to fight infections. Try to avoid beingaround people who are sick. Avoid taking products that contain aspirin, acetaminophen, ibuprofen, naproxen, or ketoprofen unless instructed by your doctor. These medicines may hide afever. This medicine may increase your risk to bruise or bleed. Call your doctor orhealth care professional if you notice any unusual bleeding. Be careful brushing and flossing your teeth or using a toothpick because you may get an infection or bleed more easily. If you have any dental work done,tell your dentist you are receiving this medicine. Do not become pregnant while taking this medicine or for 6 months after stopping it. Women should inform their health care professional if they wish to become pregnant or think they might be pregnant. Men should not father a child while taking this medicine and for 3 months after stopping it. There is potential for serious side effects  to an unborn child. Talk to your health careprofessional for more information. Do not breast-feed an infant while taking this medicine or for 7 days afterstopping it. This medicine has caused ovarian failure in some women. This medicine may make it more difficult to get pregnant. Talk to your health care professional if Ventura Sellers concerned about your fertility. This medicine has caused decreased sperm counts in some men. This may make it more difficult to father a child. Talk to your health care professional if Ventura Sellers concerned about your fertility. What side effects may I notice from receiving this medication? Side effects that you should report to your doctor or health care professionalas soon as possible: allergic  reactions like skin rash, itching or hives, swelling of the face, lips, or tongue chest pain diarrhea flushing, runny nose, sweating during infusion low blood counts - this medicine may decrease the number of white blood cells, red blood cells and platelets. You may be at increased risk for infections and bleeding. nausea, vomiting pain, swelling, warmth in the leg signs of decreased platelets or bleeding - bruising, pinpoint red spots on the skin, black, tarry stools, blood in the urine signs of infection - fever or chills, cough, sore throat, pain or difficulty passing urine signs of decreased red blood cells - unusually weak or tired, fainting spells, lightheadedness Side effects that usually do not require medical attention (report to yourdoctor or health care professional if they continue or are bothersome): constipation hair loss headache loss of appetite mouth sores stomach pain This list may not describe all possible side effects. Call your doctor for medical advice about side effects. You may report side effects to FDA at1-800-FDA-1088. Where should I keep my medication? This drug is given in a hospital or clinic and will not be stored at home. NOTE: This sheet is a summary. It may not cover all possible information. If you have questions about this medicine, talk to your doctor, pharmacist, orhealth care provider.  2022 Elsevier/Gold Standard (2019-10-30 17:46:13)  Leucovorin injection What is this medicine? LEUCOVORIN (loo koe VOR in) is used to prevent or treat the harmful effects of some medicines. This medicine is used to treat anemia caused by a low amount of folic acid in the body. It is also used with 5-fluorouracil (5-FU) to treat colon cancer. This medicine may be used for other purposes; ask your health care provider or pharmacist if you have questions. What should I tell my health care provider before I take this medicine? They need to know if you have any of these  conditions: anemia from low levels of vitamin B-12 in the blood an unusual or allergic reaction to leucovorin, folic acid, other medicines, foods, dyes, or preservatives pregnant or trying to get pregnant breast-feeding How should I use this medicine? This medicine is for injection into a muscle or into a vein. It is given by a health care professional in a hospital or clinic setting. Talk to your pediatrician regarding the use of this medicine in children. Special care may be needed. Overdosage: If you think you have taken too much of this medicine contact a poison control center or emergency room at once. NOTE: This medicine is only for you. Do not share this medicine with others. What if I miss a dose? This does not apply. What may interact with this medicine? capecitabine fluorouracil phenobarbital phenytoin primidone trimethoprim-sulfamethoxazole This list may not describe all possible interactions. Give your health care provider a list of all the medicines, herbs, non-prescription  drugs, or dietary supplements you use. Also tell them if you smoke, drink alcohol, or use illegal drugs. Some items may interact with your medicine. What should I watch for while using this medicine? Your condition will be monitored carefully while you are receiving this medicine. This medicine may increase the side effects of 5-fluorouracil, 5-FU. Tell your doctor or health care professional if you have diarrhea or mouth sores that do not get better or that get worse. What side effects may I notice from receiving this medicine? Side effects that you should report to your doctor or health care professional as soon as possible: allergic reactions like skin rash, itching or hives, swelling of the face, lips, or tongue breathing problems fever, infection mouth sores unusual bleeding or bruising unusually weak or tired Side effects that usually do not require medical attention (report to your doctor or  health care professional if they continue or are bothersome): constipation or diarrhea loss of appetite nausea, vomiting This list may not describe all possible side effects. Call your doctor for medical advice about side effects. You may report side effects to FDA at 1-800-FDA-1088. Where should I keep my medicine? This drug is given in a hospital or clinic and will not be stored at home. NOTE: This sheet is a summary. It may not cover all possible information. If you have questions about this medicine, talk to your doctor, pharmacist, or health care provider.  2021 Elsevier/Gold Standard (2008-06-04 16:50:29)  Fluorouracil, 5-FU injection What is this medicine? FLUOROURACIL, 5-FU (flure oh YOOR a sil) is a chemotherapy drug. It slows the growth of cancer cells. This medicine is used to treat many types of cancer like breast cancer, colon or rectal cancer, pancreatic cancer, and stomach cancer. This medicine may be used for other purposes; ask your health care provider or pharmacist if you have questions. COMMON BRAND NAME(S): Adrucil What should I tell my health care provider before I take this medicine? They need to know if you have any of these conditions: blood disorders dihydropyrimidine dehydrogenase (DPD) deficiency infection (especially a virus infection such as chickenpox, cold sores, or herpes) kidney disease liver disease malnourished, poor nutrition recent or ongoing radiation therapy an unusual or allergic reaction to fluorouracil, other chemotherapy, other medicines, foods, dyes, or preservatives pregnant or trying to get pregnant breast-feeding How should I use this medicine? This drug is given as an infusion or injection into a vein. It is administered in a hospital or clinic by a specially trained health care professional. Talk to your pediatrician regarding the use of this medicine in children. Special care may be needed. Overdosage: If you think you have taken too  much of this medicine contact a poison control center or emergency room at once. NOTE: This medicine is only for you. Do not share this medicine with others. What if I miss a dose? It is important not to miss your dose. Call your doctor or health care professional if you are unable to keep an appointment. What may interact with this medicine? Do not take this medicine with any of the following medications: live virus vaccines This medicine may also interact with the following medications: medicines that treat or prevent blood clots like warfarin, enoxaparin, and dalteparin This list may not describe all possible interactions. Give your health care provider a list of all the medicines, herbs, non-prescription drugs, or dietary supplements you use. Also tell them if you smoke, drink alcohol, or use illegal drugs. Some items may interact with your  medicine. What should I watch for while using this medicine? Visit your doctor for checks on your progress. This drug may make you feel generally unwell. This is not uncommon, as chemotherapy can affect healthy cells as well as cancer cells. Report any side effects. Continue your course of treatment even though you feel ill unless your doctor tells you to stop. In some cases, you may be given additional medicines to help with side effects. Follow all directions for their use. Call your doctor or health care professional for advice if you get a fever, chills or sore throat, or other symptoms of a cold or flu. Do not treat yourself. This drug decreases your body's ability to fight infections. Try to avoid being around people who are sick. This medicine may increase your risk to bruise or bleed. Call your doctor or health care professional if you notice any unusual bleeding. Be careful brushing and flossing your teeth or using a toothpick because you may get an infection or bleed more easily. If you have any dental work done, tell your dentist you are receiving  this medicine. Avoid taking products that contain aspirin, acetaminophen, ibuprofen, naproxen, or ketoprofen unless instructed by your doctor. These medicines may hide a fever. Do not become pregnant while taking this medicine. Women should inform their doctor if they wish to become pregnant or think they might be pregnant. There is a potential for serious side effects to an unborn child. Talk to your health care professional or pharmacist for more information. Do not breast-feed an infant while taking this medicine. Men should inform their doctor if they wish to father a child. This medicine may lower sperm counts. Do not treat diarrhea with over the counter products. Contact your doctor if you have diarrhea that lasts more than 2 days or if it is severe and watery. This medicine can make you more sensitive to the sun. Keep out of the sun. If you cannot avoid being in the sun, wear protective clothing and use sunscreen. Do not use sun lamps or tanning beds/booths. What side effects may I notice from receiving this medicine? Side effects that you should report to your doctor or health care professional as soon as possible: allergic reactions like skin rash, itching or hives, swelling of the face, lips, or tongue low blood counts - this medicine may decrease the number of white blood cells, red blood cells and platelets. You may be at increased risk for infections and bleeding. signs of infection - fever or chills, cough, sore throat, pain or difficulty passing urine signs of decreased platelets or bleeding - bruising, pinpoint red spots on the skin, black, tarry stools, blood in the urine signs of decreased red blood cells - unusually weak or tired, fainting spells, lightheadedness breathing problems changes in vision chest pain mouth sores nausea and vomiting pain, swelling, redness at site where injected pain, tingling, numbness in the hands or feet redness, swelling, or sores on hands or  feet stomach pain unusual bleeding Side effects that usually do not require medical attention (report to your doctor or health care professional if they continue or are bothersome): changes in finger or toe nails diarrhea dry or itchy skin hair loss headache loss of appetite sensitivity of eyes to the light stomach upset unusually teary eyes This list may not describe all possible side effects. Call your doctor for medical advice about side effects. You may report side effects to FDA at 1-800-FDA-1088. Where should I keep my medicine? This drug  is given in a hospital or clinic and will not be stored at home. NOTE: This sheet is a summary. It may not cover all possible information. If you have questions about this medicine, talk to your doctor, pharmacist, or health care provider.  2021 Elsevier/Gold Standard (2019-10-30 15:00:03)

## 2021-05-28 ENCOUNTER — Ambulatory Visit: Payer: Self-pay

## 2021-05-28 ENCOUNTER — Encounter: Payer: Self-pay | Admitting: Oncology

## 2021-05-29 ENCOUNTER — Inpatient Hospital Stay: Payer: Medicaid Other

## 2021-05-29 ENCOUNTER — Encounter: Payer: Self-pay | Admitting: Oncology

## 2021-05-29 ENCOUNTER — Other Ambulatory Visit: Payer: Self-pay

## 2021-05-29 VITALS — BP 115/74 | HR 84 | Temp 98.2°F | Resp 20

## 2021-05-29 DIAGNOSIS — Z5111 Encounter for antineoplastic chemotherapy: Secondary | ICD-10-CM | POA: Diagnosis not present

## 2021-05-29 DIAGNOSIS — C250 Malignant neoplasm of head of pancreas: Secondary | ICD-10-CM

## 2021-05-29 MED ORDER — SODIUM CHLORIDE 0.9% FLUSH
10.0000 mL | INTRAVENOUS | Status: DC | PRN
  Administered 2021-05-29: 10 mL
  Filled 2021-05-29: qty 10

## 2021-05-29 MED ORDER — PEGFILGRASTIM-CBQV 6 MG/0.6ML ~~LOC~~ SOSY
6.0000 mg | PREFILLED_SYRINGE | Freq: Once | SUBCUTANEOUS | Status: AC
  Administered 2021-05-29: 6 mg via SUBCUTANEOUS

## 2021-05-29 MED ORDER — HEPARIN SOD (PORK) LOCK FLUSH 100 UNIT/ML IV SOLN
500.0000 [IU] | Freq: Once | INTRAVENOUS | Status: AC | PRN
Start: 2021-05-29 — End: 2021-05-29
  Administered 2021-05-29: 500 [IU]
  Filled 2021-05-29: qty 5

## 2021-05-29 NOTE — Patient Instructions (Signed)

## 2021-06-01 NOTE — Progress Notes (Signed)
..  The following Assist/Replace Program for Udenyca from [place] has been terminated due to patient has Medicaid coverage starting 05/13/2021.  Last DOS: 05/29/2021.

## 2021-06-04 ENCOUNTER — Other Ambulatory Visit: Payer: Self-pay | Admitting: Nurse Practitioner

## 2021-06-04 DIAGNOSIS — C250 Malignant neoplasm of head of pancreas: Secondary | ICD-10-CM

## 2021-06-05 ENCOUNTER — Ambulatory Visit: Payer: Self-pay | Admitting: Dietician

## 2021-06-05 ENCOUNTER — Encounter: Payer: Self-pay | Admitting: Oncology

## 2021-06-05 NOTE — Progress Notes (Signed)
Nutrition  Patient scheduled for nutrition consult via telephone. Nutrition is following, unable to reach pt for last telephone appt on 5/6. Patient did not answer for nutrition appt today. Message with request for return call left on voicemail. Contact information provided.

## 2021-06-09 ENCOUNTER — Inpatient Hospital Stay: Payer: Medicaid Other

## 2021-06-09 ENCOUNTER — Inpatient Hospital Stay: Payer: Medicaid Other | Admitting: Oncology

## 2021-06-15 ENCOUNTER — Other Ambulatory Visit: Payer: Self-pay | Admitting: Oncology

## 2021-06-17 ENCOUNTER — Inpatient Hospital Stay: Payer: Medicaid Other

## 2021-06-17 ENCOUNTER — Encounter (HOSPITAL_COMMUNITY): Payer: Self-pay | Admitting: Radiology

## 2021-06-17 ENCOUNTER — Inpatient Hospital Stay (HOSPITAL_COMMUNITY)
Admission: AD | Admit: 2021-06-17 | Discharge: 2021-07-07 | DRG: 682 | Disposition: A | Discharge status: Home / Self Care | Payer: Medicaid Other | Source: Ambulatory Visit | Attending: Internal Medicine | Admitting: Internal Medicine

## 2021-06-17 ENCOUNTER — Other Ambulatory Visit: Payer: Self-pay

## 2021-06-17 ENCOUNTER — Inpatient Hospital Stay: Payer: Medicaid Other | Attending: Oncology

## 2021-06-17 ENCOUNTER — Telehealth: Payer: Self-pay | Admitting: Emergency Medicine

## 2021-06-17 ENCOUNTER — Encounter (HOSPITAL_COMMUNITY): Payer: Self-pay | Admitting: Internal Medicine

## 2021-06-17 ENCOUNTER — Inpatient Hospital Stay (HOSPITAL_BASED_OUTPATIENT_CLINIC_OR_DEPARTMENT_OTHER): Payer: Medicaid Other | Admitting: Nurse Practitioner

## 2021-06-17 ENCOUNTER — Inpatient Hospital Stay (HOSPITAL_COMMUNITY): Payer: Medicaid Other

## 2021-06-17 ENCOUNTER — Encounter: Payer: Self-pay | Admitting: Nurse Practitioner

## 2021-06-17 VITALS — BP 108/86 | HR 97 | Temp 98.2°F | Resp 18 | Ht 64.0 in | Wt 198.0 lb

## 2021-06-17 DIAGNOSIS — I700 Atherosclerosis of aorta: Secondary | ICD-10-CM | POA: Diagnosis present

## 2021-06-17 DIAGNOSIS — G253 Myoclonus: Secondary | ICD-10-CM | POA: Diagnosis not present

## 2021-06-17 DIAGNOSIS — Z79899 Other long term (current) drug therapy: Secondary | ICD-10-CM

## 2021-06-17 DIAGNOSIS — K5900 Constipation, unspecified: Secondary | ICD-10-CM | POA: Diagnosis not present

## 2021-06-17 DIAGNOSIS — Z91018 Allergy to other foods: Secondary | ICD-10-CM

## 2021-06-17 DIAGNOSIS — I10 Essential (primary) hypertension: Secondary | ICD-10-CM | POA: Diagnosis present

## 2021-06-17 DIAGNOSIS — Z720 Tobacco use: Secondary | ICD-10-CM | POA: Diagnosis present

## 2021-06-17 DIAGNOSIS — K3184 Gastroparesis: Secondary | ICD-10-CM | POA: Diagnosis present

## 2021-06-17 DIAGNOSIS — C539 Malignant neoplasm of cervix uteri, unspecified: Secondary | ICD-10-CM | POA: Insufficient documentation

## 2021-06-17 DIAGNOSIS — C250 Malignant neoplasm of head of pancreas: Secondary | ICD-10-CM

## 2021-06-17 DIAGNOSIS — N390 Urinary tract infection, site not specified: Secondary | ICD-10-CM | POA: Diagnosis present

## 2021-06-17 DIAGNOSIS — Z87891 Personal history of nicotine dependence: Secondary | ICD-10-CM

## 2021-06-17 DIAGNOSIS — Z8541 Personal history of malignant neoplasm of cervix uteri: Secondary | ICD-10-CM

## 2021-06-17 DIAGNOSIS — E875 Hyperkalemia: Secondary | ICD-10-CM | POA: Diagnosis not present

## 2021-06-17 DIAGNOSIS — E7800 Pure hypercholesterolemia, unspecified: Secondary | ICD-10-CM | POA: Diagnosis present

## 2021-06-17 DIAGNOSIS — Z880 Allergy status to penicillin: Secondary | ICD-10-CM

## 2021-06-17 DIAGNOSIS — E440 Moderate protein-calorie malnutrition: Secondary | ICD-10-CM | POA: Diagnosis present

## 2021-06-17 DIAGNOSIS — J4540 Moderate persistent asthma, uncomplicated: Secondary | ICD-10-CM

## 2021-06-17 DIAGNOSIS — E890 Postprocedural hypothyroidism: Secondary | ICD-10-CM | POA: Diagnosis present

## 2021-06-17 DIAGNOSIS — C801 Malignant (primary) neoplasm, unspecified: Secondary | ICD-10-CM | POA: Insufficient documentation

## 2021-06-17 DIAGNOSIS — E038 Other specified hypothyroidism: Secondary | ICD-10-CM | POA: Diagnosis not present

## 2021-06-17 DIAGNOSIS — C7951 Secondary malignant neoplasm of bone: Secondary | ICD-10-CM | POA: Diagnosis present

## 2021-06-17 DIAGNOSIS — J449 Chronic obstructive pulmonary disease, unspecified: Secondary | ICD-10-CM | POA: Diagnosis present

## 2021-06-17 DIAGNOSIS — Z886 Allergy status to analgesic agent status: Secondary | ICD-10-CM

## 2021-06-17 DIAGNOSIS — C787 Secondary malignant neoplasm of liver and intrahepatic bile duct: Secondary | ICD-10-CM | POA: Diagnosis present

## 2021-06-17 DIAGNOSIS — E039 Hypothyroidism, unspecified: Secondary | ICD-10-CM | POA: Diagnosis present

## 2021-06-17 DIAGNOSIS — E876 Hypokalemia: Secondary | ICD-10-CM | POA: Diagnosis present

## 2021-06-17 DIAGNOSIS — K2970 Gastritis, unspecified, without bleeding: Secondary | ICD-10-CM | POA: Diagnosis present

## 2021-06-17 DIAGNOSIS — R1310 Dysphagia, unspecified: Secondary | ICD-10-CM | POA: Diagnosis present

## 2021-06-17 DIAGNOSIS — B9620 Unspecified Escherichia coli [E. coli] as the cause of diseases classified elsewhere: Secondary | ICD-10-CM | POA: Diagnosis not present

## 2021-06-17 DIAGNOSIS — G9341 Metabolic encephalopathy: Secondary | ICD-10-CM | POA: Diagnosis not present

## 2021-06-17 DIAGNOSIS — M549 Dorsalgia, unspecified: Secondary | ICD-10-CM

## 2021-06-17 DIAGNOSIS — B370 Candidal stomatitis: Secondary | ICD-10-CM | POA: Diagnosis present

## 2021-06-17 DIAGNOSIS — D500 Iron deficiency anemia secondary to blood loss (chronic): Secondary | ICD-10-CM | POA: Diagnosis present

## 2021-06-17 DIAGNOSIS — Z8673 Personal history of transient ischemic attack (TIA), and cerebral infarction without residual deficits: Secondary | ICD-10-CM | POA: Diagnosis not present

## 2021-06-17 DIAGNOSIS — R112 Nausea with vomiting, unspecified: Secondary | ICD-10-CM | POA: Diagnosis present

## 2021-06-17 DIAGNOSIS — C7989 Secondary malignant neoplasm of other specified sites: Secondary | ICD-10-CM | POA: Diagnosis present

## 2021-06-17 DIAGNOSIS — C170 Malignant neoplasm of duodenum: Secondary | ICD-10-CM | POA: Diagnosis not present

## 2021-06-17 DIAGNOSIS — F17210 Nicotine dependence, cigarettes, uncomplicated: Secondary | ICD-10-CM | POA: Diagnosis present

## 2021-06-17 DIAGNOSIS — Z888 Allergy status to other drugs, medicaments and biological substances status: Secondary | ICD-10-CM

## 2021-06-17 DIAGNOSIS — M1990 Unspecified osteoarthritis, unspecified site: Secondary | ICD-10-CM | POA: Diagnosis present

## 2021-06-17 DIAGNOSIS — N179 Acute kidney failure, unspecified: Principal | ICD-10-CM | POA: Diagnosis present

## 2021-06-17 DIAGNOSIS — Z20822 Contact with and (suspected) exposure to covid-19: Secondary | ICD-10-CM | POA: Diagnosis present

## 2021-06-17 DIAGNOSIS — Z6838 Body mass index (BMI) 38.0-38.9, adult: Secondary | ICD-10-CM

## 2021-06-17 DIAGNOSIS — C259 Malignant neoplasm of pancreas, unspecified: Secondary | ICD-10-CM

## 2021-06-17 DIAGNOSIS — J431 Panlobular emphysema: Secondary | ICD-10-CM | POA: Diagnosis not present

## 2021-06-17 DIAGNOSIS — C784 Secondary malignant neoplasm of small intestine: Secondary | ICD-10-CM | POA: Diagnosis present

## 2021-06-17 DIAGNOSIS — Z923 Personal history of irradiation: Secondary | ICD-10-CM

## 2021-06-17 DIAGNOSIS — Z7951 Long term (current) use of inhaled steroids: Secondary | ICD-10-CM

## 2021-06-17 DIAGNOSIS — K2900 Acute gastritis without bleeding: Secondary | ICD-10-CM | POA: Diagnosis not present

## 2021-06-17 DIAGNOSIS — M7989 Other specified soft tissue disorders: Secondary | ICD-10-CM | POA: Diagnosis not present

## 2021-06-17 DIAGNOSIS — G43909 Migraine, unspecified, not intractable, without status migrainosus: Secondary | ICD-10-CM | POA: Diagnosis present

## 2021-06-17 DIAGNOSIS — R110 Nausea: Secondary | ICD-10-CM

## 2021-06-17 HISTORY — DX: Malignant neoplasm of cervix uteri, unspecified: C53.9

## 2021-06-17 LAB — CBC WITH DIFFERENTIAL (CANCER CENTER ONLY)
Abs Immature Granulocytes: 0.13 10*3/uL — ABNORMAL HIGH (ref 0.00–0.07)
Basophils Absolute: 0 10*3/uL (ref 0.0–0.1)
Basophils Relative: 1 %
Eosinophils Absolute: 0.1 10*3/uL (ref 0.0–0.5)
Eosinophils Relative: 1 %
HCT: 40.4 % (ref 36.0–46.0)
Hemoglobin: 13.2 g/dL (ref 12.0–15.0)
Immature Granulocytes: 2 %
Lymphocytes Relative: 20 %
Lymphs Abs: 1.2 10*3/uL (ref 0.7–4.0)
MCH: 29 pg (ref 26.0–34.0)
MCHC: 32.7 g/dL (ref 30.0–36.0)
MCV: 88.8 fL (ref 80.0–100.0)
Monocytes Absolute: 1.1 10*3/uL — ABNORMAL HIGH (ref 0.1–1.0)
Monocytes Relative: 18 %
Neutro Abs: 3.6 10*3/uL (ref 1.7–7.7)
Neutrophils Relative %: 58 %
Platelet Count: 220 10*3/uL (ref 150–400)
RBC: 4.55 MIL/uL (ref 3.87–5.11)
RDW: 16.3 % — ABNORMAL HIGH (ref 11.5–15.5)
WBC Count: 6.2 10*3/uL (ref 4.0–10.5)
nRBC: 0 % (ref 0.0–0.2)

## 2021-06-17 LAB — CMP (CANCER CENTER ONLY)
ALT: 12 U/L (ref 0–44)
AST: 23 U/L (ref 15–41)
Albumin: 3.5 g/dL (ref 3.5–5.0)
Alkaline Phosphatase: 98 U/L (ref 38–126)
Anion gap: 15 (ref 5–15)
BUN: 39 mg/dL — ABNORMAL HIGH (ref 6–20)
CO2: 29 mmol/L (ref 22–32)
Calcium: 8.7 mg/dL — ABNORMAL LOW (ref 8.9–10.3)
Chloride: 93 mmol/L — ABNORMAL LOW (ref 98–111)
Creatinine: 3.25 mg/dL (ref 0.44–1.00)
GFR, Estimated: 16 mL/min — ABNORMAL LOW (ref >60)
Glucose, Bld: 105 mg/dL — ABNORMAL HIGH (ref 70–99)
Potassium: 2.4 mmol/L — CL (ref 3.5–5.1)
Sodium: 137 mmol/L (ref 135–145)
Total Bilirubin: 0.7 mg/dL (ref 0.3–1.2)
Total Protein: 6.1 g/dL — ABNORMAL LOW (ref 6.5–8.1)

## 2021-06-17 LAB — RENAL FUNCTION PANEL
Albumin: 2.4 g/dL — ABNORMAL LOW (ref 3.5–5.0)
Anion gap: 9 (ref 5–15)
BUN: 32 mg/dL — ABNORMAL HIGH (ref 6–20)
CO2: 24 mmol/L (ref 22–32)
Calcium: 6.7 mg/dL — ABNORMAL LOW (ref 8.9–10.3)
Chloride: 106 mmol/L (ref 98–111)
Creatinine, Ser: 2.04 mg/dL — ABNORMAL HIGH (ref 0.44–1.00)
GFR, Estimated: 29 mL/min — ABNORMAL LOW (ref >60)
Glucose, Bld: 66 mg/dL — ABNORMAL LOW (ref 70–99)
Phosphorus: 2.7 mg/dL (ref 2.5–4.6)
Potassium: 2 mmol/L — CL (ref 3.5–5.1)
Sodium: 139 mmol/L (ref 135–145)

## 2021-06-17 LAB — MAGNESIUM: Magnesium: 1.9 mg/dL (ref 1.7–2.4)

## 2021-06-17 MED ORDER — FLUCONAZOLE IN SODIUM CHLORIDE 200-0.9 MG/100ML-% IV SOLN
200.0000 mg | Freq: Every day | INTRAVENOUS | Status: DC
  Administered 2021-06-17 – 2021-06-22 (×6): 200 mg via INTRAVENOUS
  Filled 2021-06-17 (×7): qty 100

## 2021-06-17 MED ORDER — SODIUM CHLORIDE 0.9% FLUSH
10.0000 mL | Freq: Two times a day (BID) | INTRAVENOUS | Status: DC
  Administered 2021-06-18 – 2021-06-24 (×9): 10 mL
  Administered 2021-06-25: 20 mL
  Administered 2021-06-25 – 2021-07-06 (×15): 10 mL

## 2021-06-17 MED ORDER — POTASSIUM CHLORIDE 10 MEQ/100ML IV SOLN
10.0000 meq | INTRAVENOUS | Status: AC
  Administered 2021-06-18 (×6): 10 meq via INTRAVENOUS
  Filled 2021-06-17 (×6): qty 100

## 2021-06-17 MED ORDER — CHLORHEXIDINE GLUCONATE CLOTH 2 % EX PADS
6.0000 | MEDICATED_PAD | Freq: Every day | CUTANEOUS | Status: DC
  Administered 2021-06-18 – 2021-07-07 (×21): 6 via TOPICAL

## 2021-06-17 MED ORDER — FLUCONAZOLE 100 MG PO TABS: 100.0000 mg | ORAL_TABLET | Freq: Every day | ORAL | 0 refills | Status: DC

## 2021-06-17 MED ORDER — HYDROMORPHONE HCL 1 MG/ML IJ SOLN
1.0000 mg | INTRAMUSCULAR | Status: DC | PRN
  Administered 2021-06-17 – 2021-06-22 (×20): 1 mg via INTRAVENOUS
  Filled 2021-06-17 (×21): qty 1

## 2021-06-17 MED ORDER — SODIUM CHLORIDE 0.9 % IV SOLN
INTRAVENOUS | Status: AC
Start: 2021-06-17 — End: 2021-06-17
  Filled 2021-06-17 (×2): qty 250

## 2021-06-17 MED ORDER — SODIUM CHLORIDE 0.9 % IV SOLN
Freq: Once | INTRAVENOUS | Status: AC
Start: 2021-06-17 — End: 2021-06-17
  Filled 2021-06-17: qty 250

## 2021-06-17 MED ORDER — SODIUM CHLORIDE 0.9 % IV SOLN
12.5000 mg | Freq: Three times a day (TID) | INTRAVENOUS | Status: DC | PRN
  Administered 2021-06-17 – 2021-06-26 (×6): 12.5 mg via INTRAVENOUS
  Filled 2021-06-17 (×4): qty 12.5
  Filled 2021-06-17 (×4): qty 0.5
  Filled 2021-06-17: qty 12.5

## 2021-06-17 MED ORDER — POTASSIUM CHLORIDE 10 MEQ/100ML IV SOLN
10.0000 meq | INTRAVENOUS | Status: AC
  Administered 2021-06-17 (×2): 10 meq via INTRAVENOUS
  Filled 2021-06-17: qty 100

## 2021-06-17 MED ORDER — PROCHLORPERAZINE EDISYLATE 10 MG/2ML IJ SOLN
10.0000 mg | Freq: Four times a day (QID) | INTRAMUSCULAR | Status: DC | PRN
  Administered 2021-06-17 – 2021-06-22 (×12): 10 mg via INTRAVENOUS
  Filled 2021-06-17 (×12): qty 2

## 2021-06-17 MED ORDER — HEPARIN SOD (PORK) LOCK FLUSH 100 UNIT/ML IV SOLN
500.0000 [IU] | INTRAVENOUS | Status: DC | PRN
  Administered 2021-07-07: 500 [IU]
  Filled 2021-06-17 (×2): qty 5

## 2021-06-17 MED ORDER — HEPARIN SOD (PORK) LOCK FLUSH 100 UNIT/ML IV SOLN: 500.0000 [IU] | INTRAVENOUS | Status: DC

## 2021-06-17 MED ORDER — SODIUM CHLORIDE 0.9 % IV SOLN: INTRAVENOUS | Status: DC

## 2021-06-17 MED ORDER — SODIUM CHLORIDE 0.9% FLUSH: 10.0000 mL | INTRAVENOUS | Status: DC | PRN

## 2021-06-17 MED ORDER — ALBUTEROL SULFATE (2.5 MG/3ML) 0.083% IN NEBU: 3.0000 mL | INHALATION_SOLUTION | Freq: Four times a day (QID) | RESPIRATORY_TRACT | Status: DC | PRN

## 2021-06-17 MED ORDER — DEXTROSE-NACL 5-0.9 % IV SOLN
INTRAVENOUS | Status: DC
  Administered 2021-06-24 – 2021-06-25 (×2): 1 mL via INTRAVENOUS

## 2021-06-17 MED ORDER — MOMETASONE FURO-FORMOTEROL FUM 200-5 MCG/ACT IN AERO
2.0000 | INHALATION_SPRAY | Freq: Two times a day (BID) | RESPIRATORY_TRACT | Status: DC
  Administered 2021-06-17 – 2021-06-26 (×7): 2 via RESPIRATORY_TRACT
  Filled 2021-06-17 (×2): qty 8.8

## 2021-06-17 MED ORDER — LIDOCAINE-PRILOCAINE 2.5-2.5 % EX CREA
1.0000 "application " | TOPICAL_CREAM | CUTANEOUS | Status: DC | PRN
  Administered 2021-06-24: 1 via TOPICAL
  Filled 2021-06-17: qty 5

## 2021-06-17 MED ORDER — POTASSIUM CHLORIDE 10 MEQ/100ML IV SOLN
10.0000 meq | INTRAVENOUS | Status: AC
  Administered 2021-06-17 (×3): 10 meq via INTRAVENOUS
  Filled 2021-06-17: qty 100

## 2021-06-17 MED FILL — Fluorouracil IV Soln 5 GM/100ML (50 MG/ML): INTRAVENOUS | Qty: 84 | Status: AC

## 2021-06-17 NOTE — Patient Instructions (Signed)
Implanted Port Home Guide An implanted port is a device that is placed under the skin. It is usually placed in the chest. The device can be used to give IV medicine, to take blood, or for dialysis. You may have an implanted port if: You need IV medicine that would be irritating to the small veins in your hands or arms. You need IV medicines, such as antibiotics, for a long period of time. You need IV nutrition for a long period of time. You need dialysis. When you have a port, your health care provider can choose to use the port instead of veins in your arms for these procedures. You may have fewer limitations when using a port than you would if you used other types of long-term IVs, and you will likely be able to return to normal activities afteryour incision heals. An implanted port has two main parts: Reservoir. The reservoir is the part where a needle is inserted to give medicines or draw blood. The reservoir is round. After it is placed, it appears as a small, raised area under your skin. Catheter. The catheter is a thin, flexible tube that connects the reservoir to a vein. Medicine that is inserted into the reservoir goes into the catheter and then into the vein. How is my port accessed? To access your port: A numbing cream may be placed on the skin over the port site. Your health care provider will put on a mask and sterile gloves. The skin over your port will be cleaned carefully with a germ-killing soap and allowed to dry. Your health care provider will gently pinch the port and insert a needle into it. Your health care provider will check for a blood return to make sure the port is in the vein and is not clogged. If your port needs to remain accessed to get medicine continuously (constant infusion), your health care provider will place a clear bandage (dressing) over the needle site. The dressing and needle will need to be changed every week, or as told by your health care provider. What  is flushing? Flushing helps keep the port from getting clogged. Follow instructions from your health care provider about how and when to flush the port. Ports are usually flushed with saline solution or a medicine called heparin. The need for flushing will depend on how the port is used: If the port is only used from time to time to give medicines or draw blood, the port may need to be flushed: Before and after medicines have been given. Before and after blood has been drawn. As part of routine maintenance. Flushing may be recommended every 4-6 weeks. If a constant infusion is running, the port may not need to be flushed. Throw away any syringes in a disposal container that is meant for sharp items (sharps container). You can buy a sharps container from a pharmacy, or you can make one by using an empty hard plastic bottle with a cover. How long will my port stay implanted? The port can stay in for as long as your health care provider thinks it is needed. When it is time for the port to come out, a surgery will be done to remove it. The surgery will be similar to the procedure that was done to putthe port in. Follow these instructions at home:  Flush your port as told by your health care provider. If you need an infusion over several days, follow instructions from your health care provider about how to take   care of your port site. Make sure you: Wash your hands with soap and water before you change your dressing. If soap and water are not available, use alcohol-based hand sanitizer. Change your dressing as told by your health care provider. Place any used dressings or infusion bags into a plastic bag. Throw that bag in the trash. Keep the dressing that covers the needle clean and dry. Do not get it wet. Do not use scissors or sharp objects near the tube. Keep the tube clamped, unless it is being used. Check your port site every day for signs of infection. Check for: Redness, swelling, or  pain. Fluid or blood. Pus or a bad smell. Protect the skin around the port site. Avoid wearing bra straps that rub or irritate the site. Protect the skin around your port from seat belts. Place a soft pad over your chest if needed. Bathe or shower as told by your health care provider. The site may get wet as long as you are not actively receiving an infusion. Return to your normal activities as told by your health care provider. Ask your health care provider what activities are safe for you. Carry a medical alert card or wear a medical alert bracelet at all times. This will let health care providers know that you have an implanted port in case of an emergency. Get help right away if: You have redness, swelling, or pain at the port site. You have fluid or blood coming from your port site. You have pus or a bad smell coming from the port site. You have a fever. Summary Implanted ports are usually placed in the chest for long-term IV access. Follow instructions from your health care provider about flushing the port and changing bandages (dressings). Take care of the area around your port by avoiding clothing that puts pressure on the area, and by watching for signs of infection. Protect the skin around your port from seat belts. Place a soft pad over your chest if needed. Get help right away if you have a fever or you have redness, swelling, pain, drainage, or a bad smell at the port site. This information is not intended to replace advice given to you by your health care provider. Make sure you discuss any questions you have with your healthcare provider. Document Revised: 04/14/2020 Document Reviewed: 04/14/2020 Elsevier Patient Education  2022 [employer]  

## 2021-06-17 NOTE — H&P (Addendum)
History and Physical    Lavora Brisbon ZOX:[id number] DOB: [birth year] DOA: 06/17/2021  PCP: Patient, No Pcp Per (Inactive)  Patient coming from: Home  Chief Complaint: N/V  HPI: Wendy Hoffman is a 55 y.o. female with medical history significant of pancreatic CA, HTN, HLD. Presenting with intractable N/V. She reports that she's had several days of N/V. It's worse with eating. Both solids and liquids come back up within 30 minutes. He home nausea meds do not help. She went to clinic for her normal follow up and chemo session. She started vomiting and could not stop. There was no blood in her vomit. Labs were drawn. She was found to be in AKI. It was recommended that she come to the ED for help. She denies any other aggravating or alleviating factors.    Review of Systems:  She denies CP, dyspnea, palpitations, diarrhea, fevers. She reports N/V, weakness, painful swallowing Review of systems is otherwise negative for all not mentioned in HPI.   PMHx Past Medical History:  Diagnosis Date   Anemia    Anxiety    Arthritis    Asthma    Cancer ([place])    cervic   H/O: hysterectomy    High cholesterol    Hypertension    Hypothyroidism    Insomnia    Medical history non-contributory    Migraine    Morbid obesity ([place])    Shortness of breath    Stroke [facility])    Thyroid disease     PSHx Past Surgical History:  Procedure Laterality Date   ABDOMINAL HYSTERECTOMY     BIOPSY  02/19/2021   Procedure: BIOPSY;  Surgeon: Mauri Pole, MD;  Location: [facility] ENDOSCOPY;  Service: Endoscopy;;   CESAREAN SECTION     3 occasions   CHOLECYSTECTOMY     ESOPHAGOGASTRODUODENOSCOPY (EGD) WITH PROPOFOL N/A 02/19/2021   Procedure: ESOPHAGOGASTRODUODENOSCOPY (EGD) WITH PROPOFOL;  Surgeon: Mauri Pole, MD;  Location: [facility] ENDOSCOPY;  Service: Endoscopy;  Laterality: N/A;   FLEXIBLE SIGMOIDOSCOPY N/A 02/19/2021   Procedure: FLEXIBLE SIGMOIDOSCOPY;  Surgeon: Mauri Pole, MD;  Location: [facility]  ENDOSCOPY;  Service: Endoscopy;  Laterality: N/A;   IR IMAGING GUIDED PORT INSERTION  02/24/2021   IR US GUIDE BX ASP/DRAIN  02/24/2021   THYROIDECTOMY, PARTIAL     Goiter    SocHx  reports that she has quit smoking. Her smoking use included cigarettes. She has never used smokeless tobacco. She reports that she does not drink alcohol and does not use drugs.  Allergies  Allergen Reactions   Blueberry [Vaccinium Angustifolium] Anaphylaxis   Mango Flavor Anaphylaxis   Oxaliplatin Itching and Other (See Comments)    Patient has reacted to oxaliplatin with minor symptoms of neuropathy and itching.   Reglan [Metoclopramide] Nausea And Vomiting   Aspirin Rash   Penicillins Hives    Has patient had a PCN reaction causing immediate rash, facial/tongue/throat swelling, SOB or lightheadedness with hypotension: Yes Has patient had a PCN reaction causing severe rash involving mucus membranes or skin necrosis: No Has patient had a PCN reaction that required hospitalization No Has patient had a PCN reaction occurring within the last 10 years: No If all of the above answers are "NO", then may proceed with Cephalosporin use.     FamHx Family History  Problem Relation Age of Onset   Migraines Sister    Non-Hodgkin's lymphoma Other        maternal half brother's grandson   Non-Hodgkin's lymphoma Half-Brother  maternal half brother; dx 75s or 42s   Pancreatic cancer Other 88       MGF's niece    Prior to Admission medications   Medication Sig Start Date End Date Taking? Authorizing Provider  albuterol (VENTOLIN HFA) 108 (90 Base) MCG/ACT inhaler INHALE 1 TO 2 PUFFS BY MOUTH EVERY 6 HOURS AS NEEDED FOR WHEEZING AND FOR SHORTNESS OF BREATH Patient taking differently: Inhale 1-2 puffs into the lungs every 6 (six) hours as needed for wheezing or shortness of breath. 01/06/21 01/06/22 Yes Millsaps, Joelene Millin, NP  gabapentin (NEURONTIN) 300 MG capsule TAKE 1 CAPSULE AS NEEDED BY ORAL ROUTE AT  BEDTIME. Patient taking differently: Take 300 mg by mouth at bedtime as needed (neuropathy). 01/06/21 01/06/22 Yes Millsaps, Joelene Millin, NP  lidocaine-prilocaine (EMLA) cream Apply 1 application topically as needed. Patient taking differently: Apply 1 application topically as needed (port access). 03/06/21  Yes Ladell Pier, MD  magic mouthwash SOLN Take 5-10 mLs by mouth 4 (four) times daily as needed for mouth pain (swish and spit for mouth pain). 04/06/21  Yes Ladell Pier, MD  morphine (MS CONTIN) 30 MG 12 hr tablet Take 1 tablet (30 mg total) by mouth every 12 (twelve) hours. 05/20/21  Yes Owens Shark, NP  oxyCODONE-acetaminophen (PERCOCET) 7.5-325 MG tablet Take 1 tablet by mouth every 4 (four) hours as needed for severe pain. 05/26/21  Yes Ladell Pier, MD  potassium chloride (MICRO-K) 10 MEQ CR capsule Take 2 capsules (20 mEq total) by mouth 2 (two) times daily. 06/05/21  Yes Ladell Pier, MD  prochlorperazine (COMPAZINE) 10 MG tablet TAKE 1 TABLET(10 MG) BY MOUTH EVERY 6 HOURS AS NEEDED FOR NAUSEA OR VOMITING Patient taking differently: Take 10 mg by mouth every 6 (six) hours as needed for nausea or vomiting. 05/04/21  Yes Ladell Pier, MD  SYMBICORT 160-4.5 MCG/ACT inhaler INHALE 2 PUFFS INTO THE LUNGS 2 (TWO) TIMES DAILY. Patient taking differently: Inhale 2 puffs into the lungs 2 (two) times daily. 03/07/20  Yes Charlott Rakes, MD  zolpidem (AMBIEN) 10 MG tablet Take 1 tablet (10 mg total) by mouth at bedtime as needed for sleep. Patient taking differently: Take 10 mg by mouth at bedtime. 04/14/21  Yes Owens Shark, NP  albuterol (PROVENTIL) (2.5 MG/3ML) 0.083% nebulizer solution Take 3 mLs (2.5 mg total) by nebulization every 4 (four) hours as needed for wheezing or shortness of breath. Patient not taking: No sig reported 47/0/96   Delora Fuel, MD  amLODipine (NORVASC) 10 MG tablet TAKE 1 TABLET BY MOUTH ONCE DAILY Patient not taking: No sig reported 01/06/21 01/06/22   Millsaps, Joelene Millin, NP  budesonide-formoterol (SYMBICORT) 160-4.5 MCG/ACT inhaler INHALE 2 PUFFS INTO THE LUNGS 2 (TWO) TIMES DAILY. Patient not taking: Reported on 06/17/2021 01/06/21 01/06/22  Everardo Beals, NP  dexlansoprazole (DEXILANT) 60 MG capsule TAKE 1 CAPSULE BY MOUTH ONCE DAILY Patient not taking: No sig reported 01/06/21 01/06/22  Everardo Beals, NP  fluconazole (DIFLUCAN) 100 MG tablet Take 1 tablet (100 mg total) by mouth daily for 5 days. Patient not taking: Reported on 06/17/2021 06/17/21 06/22/21  Owens Shark, NP  magnesium oxide (MAG-OX) 400 (240 Mg) MG tablet Take 1 tablet (400 mg total) by mouth daily. Patient not taking: Reported on 06/17/2021 04/27/21   Owens Shark, NP  methocarbamol (ROBAXIN) 750 MG tablet TAKE 1 TABLET 3 TIMES A DAY BY ORAL ROUTE AS NEEDED FOR 30 DAYS. Patient not taking: Reported on 06/17/2021 01/06/21 01/06/22  Millsaps,  Joelene Millin, NP  ondansetron (ZOFRAN) 4 MG tablet Take 1 tablet (4 mg total) by mouth every 6 (six) hours as needed for nausea. Patient not taking: No sig reported 04/14/21   Owens Shark, NP  pantoprazole (PROTONIX) 40 MG tablet Take 1 tablet (40 mg total) by mouth 2 (two) times daily. Patient not taking: Reported on 06/17/2021 02/28/21   Allie Bossier, MD  polyethylene glycol (MIRALAX / GLYCOLAX) 17 g packet Take 17 g by mouth daily as needed for moderate constipation. Patient not taking: No sig reported 02/28/21   Allie Bossier, MD  rosuvastatin (CRESTOR) 5 MG tablet TAKE 1 TABLET BY MOUTH ONCE DAILY Patient not taking: No sig reported 01/06/21 01/06/22  Everardo Beals, NP  topiramate (TOPAMAX) 50 MG tablet Take 1 tablet (50 mg total) by mouth 2 (two) times daily. Patient not taking: No sig reported 11/19/19   Charlott Rakes, MD  fluticasone (FLONASE) 50 MCG/ACT nasal spray Place 1 spray into both nostrils daily. Patient not taking: Reported on 03/21/2019 12/29/17 06/29/19  Petrucelli, Glynda Jaeger, PA-C    Physical Exam: Vitals:    06/17/21 1229  BP: (!) 131/97  Pulse: 72  Resp: 16  Temp: 97.9 F (36.6 C)  TempSrc: Oral  SpO2: 100%    General: 55 y.o. female resting in bed in NAD Eyes: PERRL, normal sclera ENMT: Nares patent w/o discharge, thrush noted, dentition normal, ears w/o discharge/lesions/ulcers Neck: Supple, trachea midline Cardiovascular: RRR, +S1, S2, no m/g/r, equal pulses throughout Respiratory: CTABL, no w/r/r, normal WOB GI: BS+, ND, epigastric/LUQ mild TTP, no masses noted, no organomegaly noted MSK: No e/c/c Skin: No rashes, bruises, ulcerations noted Neuro: A&O x 3, no focal deficits Psyc: Appropriate interaction and affect, calm/cooperative  Labs on Admission: I have personally reviewed following labs and imaging studies  CBC: Recent Labs  Lab 06/17/21 0820  WBC 6.2  NEUTROABS 3.6  HGB 13.2  HCT 40.4  MCV 88.8  PLT 297   Basic Metabolic Panel: Recent Labs  Lab 06/17/21 0820  NA 137  K 2.4*  CL 93*  CO2 29  GLUCOSE 105*  BUN 39*  CREATININE 3.25*  CALCIUM 8.7*  MG 1.9   GFR: Estimated Creatinine Clearance: 21.7 mL/min (A) (by C-G formula based on SCr of 3.25 mg/dL [facility])). Liver Function Tests: Recent Labs  Lab 06/17/21 0820  AST 23  ALT 12  ALKPHOS 98  BILITOT 0.7  PROT 6.1*  ALBUMIN 3.5   No results for input(s): LIPASE, AMYLASE in the last 168 hours. No results for input(s): AMMONIA in the last 168 hours. Coagulation Profile: No results for input(s): INR, PROTIME in the last 168 hours. Cardiac Enzymes: No results for input(s): CKTOTAL, CKMB, CKMBINDEX, TROPONINI in the last 168 hours. BNP (last 3 results) No results for input(s): PROBNP in the last 8760 hours. HbA1C: No results for input(s): HGBA1C in the last 72 hours. CBG: No results for input(s): GLUCAP in the last 168 hours. Lipid Profile: No results for input(s): CHOL, HDL, LDLCALC, TRIG, CHOLHDL, LDLDIRECT in the last 72 hours. Thyroid Function Tests: No results for input(s): TSH, T4TOTAL,  FREET4, T3FREE, THYROIDAB in the last 72 hours. Anemia Panel: No results for input(s): VITAMINB12, FOLATE, FERRITIN, TIBC, IRON, RETICCTPCT in the last 72 hours. Urine analysis:    Component Value Date/Time   COLORURINE STRAW (A) 05/21/2021 1257   APPEARANCEUR CLEAR 05/21/2021 1257   LABSPEC 1.006 05/21/2021 1257   PHURINE 8.0 05/21/2021 1257   GLUCOSEU NEGATIVE 05/21/2021 1257  HGBUR SMALL (A) 05/21/2021 1257   BILIRUBINUR NEGATIVE 05/21/2021 1257   [place] 05/21/2021 1257   PROTEINUR NEGATIVE 05/21/2021 1257   UROBILINOGEN 0.2 09/08/2014 1800   NITRITE NEGATIVE 05/21/2021 [place] 05/21/2021 1257    Radiological Exams on Admission: No results found.  EKG: Independently reviewed. Sinus, no st elevations  Assessment/Plan Intractable N/V     - admit to inpt, tele     - fluids, compazine     - hx of pancreatic CA w/ partially obstructing infiltrative duodenal mass; will obtain CT ab/pelvis; this will inform us on which consult she will need (GI vs sugery)     - for now NPO  Odynophagia Thrush     - she has had thrush for weeks and has tried outpt magic mouthwash; she says her condition has not improved     - will try IV fluconazole; will start her 200mg  IV; given concern for the possibility of esophageal thrush, will continue daily as 200mg  IV for now  AKI     - fluids, CT ab/pelvis ordered  Pancreatic CA on chemo     - follows w/ Dr. Benay Spice, he will follow while inpt  Hypokalemia      - replace K+, Mg2+ is ok; rpt K this evening  DVT prophylaxis: SCDs  Code Status: FULL  Family Communication: None at bedside  Consults called: Oncology, LBGI   Status is: Inpatient  Remains inpatient appropriate because:Inpatient level of care appropriate due to severity of illness  Dispo: The patient is from: Home              Anticipated d/c is to: Home              Patient currently is not medically stable to d/c.   Difficult to place patient  No  Time spent coordinating admission: 70 minutes  [facility]  If 7PM-7AM, please contact night-coverage [email or web address]  06/17/2021, 1:27 PM

## 2021-06-17 NOTE — Progress Notes (Signed)
CRITICAL VALUE STICKER  CRITICAL VALUE: K+ 2.4/Creat 3.25  RECEIVER (on-site recipient of call):Ting Cage,RN  DATE & TIME NOTIFIED: 06/17/21 at 0905  MESSENGER (representative from lab):Otila Kluver  MD NOTIFIED: Ned Card, NP  TIME OF NOTIFICATION:0906  RESPONSE: IV hydration, K+ replacement and probable admission.

## 2021-06-17 NOTE — Patient Instructions (Signed)

## 2021-06-17 NOTE — Progress Notes (Unsigned)
Patient transferred to [facility] via [place] at 1138am.  Per [facility] team, ok for patient to have potassium and IV fluids running during transport.

## 2021-06-17 NOTE — Progress Notes (Signed)
[place] OFFICE PROGRESS NOTE   Diagnosis:  Pancreas cancer  INTERVAL HISTORY:   Wendy Hoffman returns as scheduled. She completed another cycle of FOLFIRINOX 05/27/2021.  She continues to have intermittent nausea/vomiting, worse over the past week.  She tends to vomit after oral intake.  Timing is immediate to approximately 20 minutes after eating.  She is vomiting both solids and liquids.  Bowels are moving.  She noted bright red blood with a bowel movement yesterday.  She continues to lose weight.  She denies a fever.  Cold sensitivity tends to last about 3 to 4 days.  Objective:  Vital signs in last 24 hours:  Blood pressure 108/86, pulse 97, temperature 98.2 F (36.8 C), temperature source Oral, resp. rate 18, height '5\' 4"'  (1.626 m), weight 198 lb (89.8 kg), SpO2 100 %.    HEENT: Thick white coating over tongue and posterior palate.  No ulcers. Resp: Lungs clear bilaterally. Cardio: Regular rate and rhythm. GI: Abdomen soft and nontender.  No hepatomegaly.  No mass. Vascular: No leg edema. Port-A-Cath without erythema.  Lab Results:  Lab Results  Component Value Date   WBC 6.2 06/17/2021   HGB 13.2 06/17/2021   HCT 40.4 06/17/2021   MCV 88.8 06/17/2021   PLT 220 06/17/2021   NEUTROABS 3.6 06/17/2021    Imaging:  No results found.  Medications: I have reviewed the patient's current medications.  Assessment/Plan: 1. Pancreas cancer -CT abdomen/pelvis without contrast 02/16/2021-multiple large hypoechoic masses in the patient's liver consistent metastatic disease, ill-defined masslike area in the pancreatic head measuring approximate 4.7 cm, possible filling defect in the right ventricle. -EGD 02/19/2021-large infiltrative mass with bleeding in the second portion of the duodenum, appears to be arising near the ampulla, partially obstructive with friable mucosa.  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 -Flexible  sigmoidoscopy 02/19/2021-diverticulosis in the sigmoid colon, nonbleeding external and internal hemorrhoids -Cardiac CT 02/20/2021-mass in the mid RV attached to the interventricular septum measuring 16 mm x 6 mm and concerning for metastatic tumor -MRI abdomen with/without contrast 02/21/2021-mass in the posterior pancreatic head measuring 4.3 x 3.8 cm with obstruction of the central pancreatic duct and diffuse dilatation up to 6 mm consistent with pancreatic adenocarcinoma, liver lesions the largest mass of the central left lobe measuring 9.9 x 9.2 cm consistent with hepatic metastatic disease,  hepatomegaly. -Ultrasound-guided biopsy of a left liver lesion 02/24/2021-adenocarcinoma, morphologically similar to the duodenal biopsy; microsatellite stable, tumor mutation burden 1 -Negative genetic testing -Palliative radiation to the duodenal mass 02/25/2021-03/06/2021 -Cycle 1 FOLFOX 03/17/2021 -Cycle 2 FOLFOX 04/01/2021 -Cycle 3 FOLFIRINOX 04/14/2021 -Cycle 4 FOLFIRINOX 04/27/2021, Udenyca added -Cycle 5 FOLFIRINOX 05/12/2021, Udenyca -MRI abdomen 05/23/2021-decrease in size of pancreas mass and hepatic lesions, no progressive disease -Cycle 6 FOLFIRINOX 05/27/2021 2.  Iron deficiency anemia-improved -Feraheme 510 mg 02/20/2021 3.  Protein calorie malnutrition 4.  Possible right ventricular filling defect on noncontrast CT scan MRI cardiac morphology 02/21/2021-mass in the mid RV attached to the interventricular septum measures 16 mm x 6 mm.  Mass appears heterogeneous with area of enhancement on LGE imaging.  Mass is concerning for metastatic tumor.  Normal LV size and systolic function.  Normal RV size and systolic function. 5.  Hypertension 6.  History of CVA 7.  Hyperlipidemia 8.  Hypothyroidism 9.  Morbid obesity 10.  Asthma 11.  Migraines 12.  Pain secondary to #1 13.  Port-A-Cath placement 02/25/2021 14.  Hypokalemia 04/01/2021-started a potassium supplement    Disposition: Wendy Hoffman  completed  cycle 6 FOLFIRINOX on 05/27/2021.  Over the past week she has had increased intermittent nausea/vomiting related to oral intake.  We are concerned for possible obstruction.  She is likely dehydrated.  Chemotherapy placed on hold.  Review of the chemistry panel shows creatinine is elevated, hypokalemia.  We are making arrangements for hospital admission.  We will begin IV fluids in the office.  Patient seen with Dr. Benay Spice.    [name]   06/17/2021  8:59 AM This was a shared visit with Ned Card.  Wendy Hoffman was interviewed and examined.  She has persistent nausea/vomiting and continues to lose weight.  I am concerned she may have duodenal obstruction from tumor.  I contacted the hospitalist service.  She will be admitted for hydration and GI evaluation.  She may be a candidate for a repeat endoscopy.  A duodenal stent can be considered if obstructing tumor is found.  I was present for greater than 50% of today's visit.  I performed medical decision making.  Julieanne Manson, MD

## 2021-06-17 NOTE — Progress Notes (Signed)
Critical Potassium 2.0, glucose 66. Paged J. Olena Heckle.

## 2021-06-18 ENCOUNTER — Encounter (HOSPITAL_COMMUNITY): Payer: Self-pay | Admitting: Internal Medicine

## 2021-06-18 DIAGNOSIS — C250 Malignant neoplasm of head of pancreas: Secondary | ICD-10-CM | POA: Diagnosis not present

## 2021-06-18 DIAGNOSIS — C170 Malignant neoplasm of duodenum: Secondary | ICD-10-CM | POA: Diagnosis present

## 2021-06-18 DIAGNOSIS — E440 Moderate protein-calorie malnutrition: Secondary | ICD-10-CM

## 2021-06-18 DIAGNOSIS — E876 Hypokalemia: Secondary | ICD-10-CM | POA: Diagnosis not present

## 2021-06-18 DIAGNOSIS — N179 Acute kidney failure, unspecified: Secondary | ICD-10-CM | POA: Diagnosis not present

## 2021-06-18 DIAGNOSIS — R112 Nausea with vomiting, unspecified: Secondary | ICD-10-CM | POA: Diagnosis not present

## 2021-06-18 DIAGNOSIS — B370 Candidal stomatitis: Secondary | ICD-10-CM | POA: Diagnosis present

## 2021-06-18 HISTORY — DX: Acute kidney failure, unspecified: N17.9

## 2021-06-18 LAB — CBC
HCT: 34.1 % — ABNORMAL LOW (ref 36.0–46.0)
Hemoglobin: 10.8 g/dL — ABNORMAL LOW (ref 12.0–15.0)
MCH: 29.1 pg (ref 26.0–34.0)
MCHC: 31.7 g/dL (ref 30.0–36.0)
MCV: 91.9 fL (ref 80.0–100.0)
Platelets: 171 10*3/uL (ref 150–400)
RBC: 3.71 MIL/uL — ABNORMAL LOW (ref 3.87–5.11)
RDW: 16.2 % — ABNORMAL HIGH (ref 11.5–15.5)
WBC: 4.9 10*3/uL (ref 4.0–10.5)
nRBC: 0 % (ref 0.0–0.2)

## 2021-06-18 LAB — COMPREHENSIVE METABOLIC PANEL
ALT: 12 U/L (ref 0–44)
AST: 20 U/L (ref 15–41)
Albumin: 3 g/dL — ABNORMAL LOW (ref 3.5–5.0)
Alkaline Phosphatase: 74 U/L (ref 38–126)
Anion gap: 10 (ref 5–15)
BUN: 36 mg/dL — ABNORMAL HIGH (ref 6–20)
CO2: 30 mmol/L (ref 22–32)
Calcium: 8.6 mg/dL — ABNORMAL LOW (ref 8.9–10.3)
Chloride: 97 mmol/L — ABNORMAL LOW (ref 98–111)
Creatinine, Ser: 2.28 mg/dL — ABNORMAL HIGH (ref 0.44–1.00)
GFR, Estimated: 25 mL/min — ABNORMAL LOW (ref >60)
Glucose, Bld: 89 mg/dL (ref 70–99)
Potassium: 3 mmol/L — ABNORMAL LOW (ref 3.5–5.1)
Sodium: 137 mmol/L (ref 135–145)
Total Bilirubin: 0.8 mg/dL (ref 0.3–1.2)
Total Protein: 5.7 g/dL — ABNORMAL LOW (ref 6.5–8.1)

## 2021-06-18 LAB — URINALYSIS, ROUTINE W REFLEX MICROSCOPIC
Bacteria, UA: NONE SEEN
Bilirubin Urine: NEGATIVE
Glucose, UA: NEGATIVE mg/dL
Ketones, ur: NEGATIVE mg/dL
Nitrite: NEGATIVE
Protein, ur: 30 mg/dL — AB
Specific Gravity, Urine: 1.011 (ref 1.005–1.030)
pH: 6 (ref 5.0–8.0)

## 2021-06-18 LAB — GLUCOSE, CAPILLARY
Glucose-Capillary: 104 mg/dL — ABNORMAL HIGH (ref 70–99)
Glucose-Capillary: 80 mg/dL (ref 70–99)
Glucose-Capillary: 88 mg/dL (ref 70–99)
Glucose-Capillary: 89 mg/dL (ref 70–99)
Glucose-Capillary: 91 mg/dL (ref 70–99)
Glucose-Capillary: 97 mg/dL (ref 70–99)

## 2021-06-18 LAB — HIV ANTIBODY (ROUTINE TESTING W REFLEX)
HIV Screen 4th Generation wRfx: NONREACTIVE
HIV Screen 4th Generation wRfx: NONREACTIVE

## 2021-06-18 LAB — MAGNESIUM: Magnesium: 1.8 mg/dL (ref 1.7–2.4)

## 2021-06-18 LAB — SODIUM, URINE, RANDOM: Sodium, Ur: 14 mmol/L

## 2021-06-18 LAB — CREATININE, URINE, RANDOM: Creatinine, Urine: 194.39 mg/dL

## 2021-06-18 LAB — PHOSPHORUS: Phosphorus: 3.4 mg/dL (ref 2.5–4.6)

## 2021-06-18 LAB — SARS CORONAVIRUS 2 (TAT 6-24 HRS): SARS Coronavirus 2: NEGATIVE

## 2021-06-18 MED ORDER — LIDOCAINE VISCOUS HCL 2 % MT SOLN
15.0000 mL | Freq: Four times a day (QID) | OROMUCOSAL | Status: DC | PRN
  Administered 2021-06-18: 15 mL via ORAL
  Filled 2021-06-18 (×2): qty 15

## 2021-06-18 MED ORDER — MAGNESIUM SULFATE 2 GM/50ML IV SOLN
2.0000 g | Freq: Once | INTRAVENOUS | Status: AC
  Administered 2021-06-18: 2 g via INTRAVENOUS
  Filled 2021-06-18: qty 50

## 2021-06-18 MED ORDER — ALUM & MAG HYDROXIDE-SIMETH 200-200-20 MG/5ML PO SUSP
30.0000 mL | Freq: Four times a day (QID) | ORAL | Status: DC | PRN
  Administered 2021-06-18: 30 mL via ORAL
  Filled 2021-06-18: qty 30

## 2021-06-18 MED ORDER — PANTOPRAZOLE SODIUM 40 MG IV SOLR
40.0000 mg | INTRAVENOUS | Status: DC
  Administered 2021-06-18: 40 mg via INTRAVENOUS
  Filled 2021-06-18: qty 40

## 2021-06-18 MED ORDER — POTASSIUM CHLORIDE 10 MEQ/100ML IV SOLN
10.0000 meq | INTRAVENOUS | Status: AC
  Administered 2021-06-18 (×4): 10 meq via INTRAVENOUS
  Filled 2021-06-18 (×4): qty 100

## 2021-06-18 MED ORDER — PANTOPRAZOLE SODIUM 40 MG IV SOLR
40.0000 mg | Freq: Two times a day (BID) | INTRAVENOUS | Status: DC
  Administered 2021-06-18 – 2021-06-30 (×24): 40 mg via INTRAVENOUS
  Filled 2021-06-18 (×24): qty 40

## 2021-06-18 MED ORDER — NYSTATIN 100000 UNIT/ML MT SUSP
5.0000 mL | Freq: Four times a day (QID) | OROMUCOSAL | Status: DC
  Administered 2021-06-18 – 2021-06-23 (×13): 500000 [IU] via ORAL
  Filled 2021-06-18 (×12): qty 5

## 2021-06-18 MED ORDER — SODIUM CHLORIDE 0.9 % IV BOLUS
1000.0000 mL | Freq: Once | INTRAVENOUS | Status: AC
  Administered 2021-06-18: 1000 mL via INTRAVENOUS

## 2021-06-18 NOTE — Progress Notes (Addendum)
HEMATOLOGY-ONCOLOGY PROGRESS NOTE  SUBJECTIVE: Wendy Hoffman was seen yesterday in our office and was sent to the hospital for admission due to nausea and vomiting.  She has not been able to eat due to nausea vomiting and is losing weight.  She continues to have abdominal discomfort this morning.  Continues to have intermittent nausea and vomiting.  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  The report date is March 23, 2021.   The Multi-Cancer Panel with pancreatitis genes and preliminary pancreatic cancer genes offered by [facility] includes sequencing and/or deletion duplication testing of the following 91 genes: AIP, ALK, APC, ATM, AXIN2,BAP1,  BARD1, BLM, BMPR1A, BRCA1, BRCA2, BRIP1, CASR, CDC73, CDH1, CDK4, CDKN1B, CDKN1C, CDKN2A (p14ARF), CDKN2A (p16INK4a), CEBPA, CFTR, CHEK2, CPA1, CTNNA1, CTRC, DICER1, DIS3L2, EGFR (c.2369C>T, p.Thr790Met variant only), EPCAM (Deletion/duplication testing only), FANCC, FH, FLCN, GATA2, GPC3, GREM1 (Promoter region deletion/duplication testing only), HOXB13 (c.251G>A, p.Gly84Glu), HRAS, KIT, MAX, MEN1, MET, MITF (c.952G>A, p.Glu318Lys variant only), MLH1, MSH2, MSH3, MSH6, MUTYH, NBN, NF1, NF2, NTHL1, PALB2, PALLD, PDGFRA, PHOX2B, PMS2, POLD1, POLE, POT1, PRKAR1A, PRSS1, PTCH1, PTEN, RAD50, RAD51C, RAD51D, RB1, RECQL4, RET, RNF43, RUNX1, SDHAF2, SDHA (sequence changes only), SDHB, SDHC, SDHD, SMAD4, SMARCA4,  SMARCB1, SMARCE1, SPINK1, STK11, SUFU, TERC, TERT, TMEM127, TP53, TSC1, TSC2, VHL, WRN and WT1.     PHYSICAL EXAMINATION:  Vitals:   06/18/21 0009 06/18/21 0412  BP: 101/64 113/76  Pulse: 70 66  Resp: 18 18  Temp: 98.1 F (36.7 C) 98.1 F (36.7 C)  SpO2: 96% 93%   There were no vitals filed for this visit.  Intake/Output from previous day: 07/06 0701 - 07/07 0700 In: 600.4 [I.V.:600.4] Out: -   GENERAL:alert, no distress and comfortable SKIN: skin color, texture, turgor are normal, no rashes or significant lesions EYES: normal, Conjunctiva are pink and non-injected, sclera clear OROPHARYNX: Thrush noted on tongue LUNGS: clear to auscultation and percussion with normal breathing effort HEART: regular rate & rhythm and no murmurs and no lower extremity edema ABDOMEN: Positive bowel sounds, soft, tenderness with palpation NEURO: alert & oriented x 3 with fluent speech, no focal motor/sensory deficits  LABORATORY DATA:  I have reviewed the data as listed CMP Latest Ref Rng & Units 06/18/2021 06/17/2021 06/17/2021  Glucose 70 - 99 mg/dL 89 66(L) 105(H)  BUN 6 - 20 mg/dL 36(H) 32(H) 39(H)  Creatinine 0.44 - 1.00 mg/dL 2.28(H) 2.04(H) 3.25(HH)  Sodium 135 - 145 mmol/L 137 139 137  Potassium 3.5 - 5.1 mmol/L 3.0(L) 2.0(LL) 2.4(LL)  Chloride 98 - 111 mmol/L 97(L) 106 93(L)  CO2 22 - 32 mmol/L '30 24 29  ' Calcium 8.9 - 10.3 mg/dL 8.6(L) 6.7(L) 8.7(L)  Total Protein 6.5 - 8.1 g/dL 5.7(L) - 6.1(L)  Total Bilirubin 0.3 - 1.2 mg/dL 0.8 - 0.7  Alkaline Phos 38 - 126 U/L 74 - 98  AST 15 - 41 U/L 20 - 23  ALT 0 - 44 U/L  12 - 12    Lab Results  Component Value Date   WBC 4.9 06/18/2021   HGB 10.8 (L) 06/18/2021   HCT 34.1 (L) 06/18/2021   MCV 91.9 06/18/2021   PLT 171 06/18/2021   NEUTROABS 3.6 06/17/2021    CT ABDOMEN PELVIS WO CONTRAST  Result Date: 06/17/2021 CLINICAL DATA:  Rule out bowel obstruction. History of pancreas cancer. EXAM: CT ABDOMEN AND PELVIS WITHOUT CONTRAST  TECHNIQUE: Multidetector CT imaging of the abdomen and pelvis was performed following the standard protocol without IV contrast. COMPARISON:  05/23/2021 FINDINGS: Lower chest: No acute abnormality. Hepatobiliary: Within a background of diffuse hepatic steatosis there are multiple liver metastases as noted previously: The dominant lesion within the lateral segment of left hepatic lobe measures 7.8 cm, image 26/2. Previously this measured the same. The dominant lesion within the right hepatic lobe measures 8.2 cm, image 25/2. Previously 8 cm. Status post cholecystectomy. No biliary dilatation. Pancreas: Head of pancreas mass is suboptimally evaluated due to lack of IV contrast material. Subjectively, this does not appear significantly changed in size when compared with the previous contrast enhanced MRI. Spleen: Normal in size without focal abnormality. Adrenals/Urinary Tract: Normal adrenal glands. No hydronephrosis identified bilaterally. Punctate stone noted within inferior pole of left kidney. Exophytic cyst is again noted arising off the posterior cortex of the interpolar right kidney measuring 1.3 cm. Urinary bladder appears collapsed. Stomach/Bowel: Stomach is nondistended. The appendix is visualized and appears normal. No pathologic dilatation of the large or small bowel loops. No significant bowel wall thickening or surrounding inflammatory fat stranding. Vascular/Lymphatic: Mild aortic atherosclerosis. No aneurysm. No abdominopelvic adenopathy identified. Reproductive: Status post hysterectomy. No adnexal masses.  The Other: No ascites or focal fluid collections. No peritoneal nodularity identified at this time. Musculoskeletal: No acute or significant osseous findings. IMPRESSION: 1. No acute findings within the abdomen or pelvis. No evidence for bowel obstruction. 2. Multiple liver metastases.  Similar to previous exam. 3. Head of pancreas mass is suboptimally evaluated due to lack of IV contrast material.  Subjectively, this does not appear significantly changed in size when compared with the previous contrast enhanced MRI. 4. Aortic atherosclerosis. Aortic Atherosclerosis (ICD10-I70.0). Electronically Signed   By: Kerby Moors M.D.   On: 06/17/2021 19:07   MR Abdomen W Wo Contrast  Result Date: 05/23/2021 CLINICAL DATA:  Follow-up pancreatic cancer. EXAM: MRI ABDOMEN WITHOUT AND WITH CONTRAST TECHNIQUE: Multiplanar multisequence MR imaging of the abdomen was performed both before and after the administration of intravenous contrast. CONTRAST:  3m GADAVIST GADOBUTROL 1 MMOL/ML IV SOLN COMPARISON:  Prior MRI examination 02/21/2021 FINDINGS: Lower chest: The lung bases are grossly clear. No obvious pulmonary lesions and no pleural or pericardial effusion. Hepatobiliary: Diffuse and marked low T2 and low T1 signal intensity throughout the liver suggesting transfusional hemosiderosis. Numerous hepatic metastatic lesions are again demonstrated. The largest left hepatic lobe lesion mainly involving segment 3 measures a maximum 7.6 x 6.0 cm on image 9 of series 12. This previously measured 10.1 x 8.6 cm. Right hepatic lobe lesion in segment 8 and segment 7 measures 7.6 x 3.7 cm on image 7 of series 12. This previously measured 9.2 x 5.4 cm. Segment 3 lesion measures a maximum of 3 cm on image number 17/12 and previously measured 4.1 cm. Numerous other smaller metastatic lesions are noted in both lobes. No new or progressive findings are identified. Stable mild common bile duct dilatation measuring a maximum of 8 mm. Pancreas: The pancreatic head mass measures  approximately 2.9 x 2.9 cm on image 25/7 this previously measured 4.2 x 3.9 cm. Stable dilatation of the main pancreatic duct and stable atrophy of the body and tail of the pancreas. Spleen:  No splenic lesions. Adrenals/Urinary Tract: Adrenal glands and kidneys are unremarkable. Small renal cysts are noted. Stomach/Bowel: The stomach, duodenum, visualized small  bowel and visualized colon are grossly normal. Vascular/Lymphatic: No mesenteric or retroperitoneal adenopathy. The aorta and branch vessels are patent. Other:  No ascites or abdominal wall hernia. Musculoskeletal: No significant bony findings. IMPRESSION: 1. Interval decrease in size of the pancreatic head mass. 2. Interval decrease in size of the hepatic metastatic lesions. 3. No new or progressive findings. Electronically Signed   By: Marijo Sanes M.D.   On: 05/23/2021 16:24   MR 3D Recon At Scanner  Result Date: 05/23/2021 CLINICAL DATA:  Follow-up pancreatic cancer. EXAM: MRI ABDOMEN WITHOUT AND WITH CONTRAST TECHNIQUE: Multiplanar multisequence MR imaging of the abdomen was performed both before and after the administration of intravenous contrast. CONTRAST:  80m GADAVIST GADOBUTROL 1 MMOL/ML IV SOLN COMPARISON:  Prior MRI examination 02/21/2021 FINDINGS: Lower chest: The lung bases are grossly clear. No obvious pulmonary lesions and no pleural or pericardial effusion. Hepatobiliary: Diffuse and marked low T2 and low T1 signal intensity throughout the liver suggesting transfusional hemosiderosis. Numerous hepatic metastatic lesions are again demonstrated. The largest left hepatic lobe lesion mainly involving segment 3 measures a maximum 7.6 x 6.0 cm on image 9 of series 12. This previously measured 10.1 x 8.6 cm. Right hepatic lobe lesion in segment 8 and segment 7 measures 7.6 x 3.7 cm on image 7 of series 12. This previously measured 9.2 x 5.4 cm. Segment 3 lesion measures a maximum of 3 cm on image number 17/12 and previously measured 4.1 cm. Numerous other smaller metastatic lesions are noted in both lobes. No new or progressive findings are identified. Stable mild common bile duct dilatation measuring a maximum of 8 mm. Pancreas: The pancreatic head mass measures approximately 2.9 x 2.9 cm on image 25/7 this previously measured 4.2 x 3.9 cm. Stable dilatation of the main pancreatic duct and stable  atrophy of the body and tail of the pancreas. Spleen:  No splenic lesions. Adrenals/Urinary Tract: Adrenal glands and kidneys are unremarkable. Small renal cysts are noted. Stomach/Bowel: The stomach, duodenum, visualized small bowel and visualized colon are grossly normal. Vascular/Lymphatic: No mesenteric or retroperitoneal adenopathy. The aorta and branch vessels are patent. Other:  No ascites or abdominal wall hernia. Musculoskeletal: No significant bony findings. IMPRESSION: 1. Interval decrease in size of the pancreatic head mass. 2. Interval decrease in size of the hepatic metastatic lesions. 3. No new or progressive findings. Electronically Signed   By: PMarijo SanesM.D.   On: 05/23/2021 16:24    ASSESSMENT AND PLAN: 1. Pancreas cancer -CT abdomen/pelvis without contrast 02/16/2021-multiple large hypoechoic masses in the patient's liver consistent metastatic disease, ill-defined masslike area in the pancreatic head measuring approximate 4.7 cm, possible filling defect in the right ventricle. -EGD 02/19/2021-large infiltrative mass with bleeding in the second portion of the duodenum, appears to be arising near the ampulla, partially obstructive with friable mucosa.  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 -Flexible sigmoidoscopy 02/19/2021-diverticulosis in the sigmoid colon, nonbleeding external and internal hemorrhoids -Cardiac CT 02/20/2021-mass in the mid RV attached to the interventricular septum measuring 16 mm x 6 mm and concerning for metastatic tumor -MRI abdomen with/without contrast 02/21/2021-mass in the posterior pancreatic head measuring 4.3 x 3.8  cm with obstruction of the central pancreatic duct and diffuse dilatation up to 6 mm consistent with pancreatic adenocarcinoma, liver lesions the largest mass of the central left lobe measuring 9.9 x 9.2 cm consistent with hepatic metastatic disease,  hepatomegaly. -Ultrasound-guided biopsy of a left liver lesion  02/24/2021-adenocarcinoma, morphologically similar to the duodenal biopsy; microsatellite stable, tumor mutation burden 1 -Negative genetic testing -Palliative radiation to the duodenal mass 02/25/2021-03/06/2021 -Cycle 1 FOLFOX 03/17/2021 -Cycle 2 FOLFOX 04/01/2021 -Cycle 3 FOLFIRINOX 04/14/2021 -Cycle 4 FOLFIRINOX 04/27/2021, Udenyca added -Cycle 5 FOLFIRINOX 05/12/2021, Udenyca -MRI abdomen 05/23/2021-decrease in size of pancreas mass and hepatic lesions, no progressive disease -Cycle 6 FOLFIRINOX 05/27/2021 2.  Iron deficiency anemia-improved -Feraheme 510 mg 02/20/2021 3.  Protein calorie malnutrition 4.  Possible right ventricular filling defect on noncontrast CT scan MRI cardiac morphology 02/21/2021-mass in the mid RV attached to the interventricular septum measures 16 mm x 6 mm.  Mass appears heterogeneous with area of enhancement on LGE imaging.  Mass is concerning for metastatic tumor.  Normal LV size and systolic function.  Normal RV size and systolic function. 5.  Hypertension 6.  History of CVA 7.  Hyperlipidemia 8.  Hypothyroidism 9.  Morbid obesity 10.  Asthma 11.  Migraines 12.  Pain secondary to #1 13.  Port-A-Cath placement 02/25/2021 14.  Hypokalemia 04/01/2021-started a potassium supplement 15.  Hospital admission 06/17/2021-intractable nausea and vomiting  Wendy Hoffman has been admitted secondary to intractable nausea and vomiting.  CT abdomen/pelvis does not show any evidence for bowel obstruction.  Her liver mets are similar compared to prior exam.  She continues to have persistent symptoms this morning.  We are concerned that she has duodenal obstruction from her tumor.  Recommend GI evaluation.  Continue as needed antiemetics.  She has oral candidiasis.  She is already on IV fluconazole.  Recommendations: 1.  Recommend GI consult. 2.  Continue fluconazole and nystatin for oral candidiasis. 3.  Continue as needed antiemetics and pain medication. 4.  Continue Dilaudid as  needed for pain, resume outpatient oral narcotic regimen when taking p.o.   LOS: 1 day   Mikey Bussing, DNP, AGPCNP-BC, AOCNP 06/18/21 Wendy Hoffman was interviewed and examined.  She was admitted with intractable nausea/vomiting and profound weight loss.  I suspect her symptoms are related to the pancreas tumor, potentially causing duodenal obstruction.  No obvious change in the pancreas mass or obstruction were noted on the noncontrast CT yesterday.  I appreciate the consult from Dr. Alessandra Bevels.  She has been maintained on systemic therapy for pancreas cancer since April.  The CA 19-9 has stabilized and a restaging MRI last month revealed evidence of a response to therapy.  We will decide on continuing FOLFIRINOX based on the endoscopic evaluation.  I was present for greater than 50% of today's visit.  I performed medical decision making.

## 2021-06-18 NOTE — Progress Notes (Signed)
PROGRESS NOTE    Wendy Hoffman  OZH:[id number] DOB: 10-07-1966 DOA: 06/17/2021 PCP: Patient, No Pcp Per (Inactive) (Confirm with patient/family/NH records and if not entered, this HAS to be entered at [facility] point of entry. "No PCP" if truly none.)   No chief complaint on file.   Brief Narrative:  HPI per Dr. Domingo Madeira is a 55 y.o. female with medical history significant of pancreatic CA, HTN, HLD. Presenting with intractable N/V. She reports that she's had several days of N/V. It's worse with eating. Both solids and liquids come back up within 30 minutes. He home nausea meds do not help. She went to clinic for her normal follow up and chemo session. She started vomiting and could not stop. There was no blood in her vomit. Labs were drawn. She was found to be in AKI. It was recommended that she come to the ED for help. She denies any other aggravating or alleviating factors.    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 -Patient with history of pancreatic cancer with partially obstructing infiltrative duodenal mass/duodenal adenocarcinoma per EGD March 2022. -Patient also noted with some oral thrush. -CT abdomen and pelvis negative for obstruction. -Check a UA with cultures and sensitivities. -Placed on PPI IV every 12 hours. -GI cocktail as needed. -Continue IV Diflucan for oral candidiasis. -Place on nystatin swish and swallow. -Patient seen in consultation by GI and patient for upper  endoscopy tomorrow. -Continue IV fluids, supportive care.  2.  Oral candidiasis -Per admitting physician patient had thrush 4 weeks and tried outpatient Magic mouthwash with no significant improvement. -Continue IV Diflucan. -Oral nystatin swish and swallow.  3.  Odynophagia -Patient presented with some nausea vomiting.  Patient noted to have oral thrush on examination. -Patient also noted to have oral thrush 4 weeks.  Concern for possible esophageal candidiasis. -Patient seen in consultation by GI patient for upper endoscopy tomorrow. -Continue IV Diflucan. -Place on nystatin swish and swallow, IV PPI twice daily.  4.  Hypokalemia/hypomagnesemia -Potassium at 3.0, magnesium at 1.8, phosphorus at 3.4. -Replete.  5.  Acute renal failure -Baseline creatinine of 0.71(05/26/2021). -Creatinine on admission was 3.25. -Likely secondary to a prerenal azotemia in the setting of nausea and vomiting. -Urinalysis with large leukocytes, nitrite negative, 30 protein. -Urine cultures pending. -Urine sodium of 14, urine creatinine of 194.39. -Renal function improving with hydration creatinine currently at 2.28 from 3.25 on admission. -Urine output not properly recorded. -1 L normal saline bolus. -Increase IV fluids of D5 normal saline to 125 cc an hour. -Repeat labs in the morning.  6.  Pancreatic adenocarcinoma/duodenal adenocarcinoma -Per oncology.  7.  Moderate protein calorie malnutrition -Likely secondary to poor oral intake with ongoing nausea and vomiting. -Patient has been seen by GI outpatient for further evaluation with upper endoscopy tomorrow. -Follow-up.   DVT prophylaxis: SCDs Code Status: Full Family Communication: Updated patient.  No family at bedside. Disposition:   Status is: Inpatient  Remains inpatient appropriate because:IV treatments  appropriate due to intensity of illness or inability to take PO  Dispo: The patient is from: Home              Anticipated d/c  is to: Home              Patient currently is not medically stable to d/c.   Difficult to place patient No       Consultants:  Gastroenterology: Dr. Alessandra Bevels 06/18/2021 Oncology: Dr. Benay Spice  Procedures:  CT abdomen and pelvis 06/17/2021  Antimicrobials: None   Subjective: Patient still with nausea and emesis with solids and liquids. Tolerated broth.  Had an episode of emesis in emesis bag during assessment.  Patient also with complaints of a burning sensation in her throat.  Patient did state that occasionally has a sensation of food getting stuck in her throat.  Objective: Vitals:   06/17/21 2010 06/17/21 2021 06/18/21 0009 06/18/21 0412  BP: (!) 122/94  101/64 113/76  Pulse: 65  70 66  Resp: 18  18 18   Temp: 98 F (36.7 C)  98.1 F (36.7 C) 98.1 F (36.7 C)  TempSrc: Oral  Oral Oral  SpO2: 100% 98% 96% 93%    Intake/Output Summary (Last 24 hours) at 06/18/2021 1246 Last data filed at 06/18/2021 6734 Gross per 24 hour  Intake 600.42 ml  Output --  Net 600.42 ml   There were no vitals filed for this visit.  Examination:  General exam: Appears calm and comfortable.  Oral thrush. Respiratory system: Clear to auscultation. Respiratory effort normal. Cardiovascular system: S1 & S2 heard, RRR. No JVD, murmurs, rubs, gallops or clicks. No pedal edema. Gastrointestinal system: Abdomen is nondistended, soft and tender to palpation in the epigastrium.  Positive bowel sounds.  No rebound.  No guarding.  Central nervous system: Alert and oriented. No focal neurological deficits. Extremities: Symmetric 5 x 5 power. Skin: No rashes, lesions or ulcers Psychiatry: Judgement and insight appear normal. Mood & affect appropriate.     Data Reviewed: I have personally reviewed following labs and imaging studies  CBC: Recent Labs  Lab 06/17/21 0820 06/18/21 0453  WBC 6.2 4.9  NEUTROABS 3.6  --   HGB 13.2 10.8*  HCT 40.4 34.1*  MCV 88.8 91.9  PLT 220 171    Basic  Metabolic Panel: Recent Labs  Lab 06/17/21 0820 06/17/21 2200 06/18/21 0453  NA 137 139 137  K 2.4* 2.0* 3.0*  CL 93* 106 97*  CO2 29 24 30   GLUCOSE 105* 66* 89  BUN 39* 32* 36*  CREATININE 3.25* 2.04* 2.28*  CALCIUM 8.7* 6.7* 8.6*  MG 1.9  --  1.8  PHOS  --  2.7 3.4    GFR: Estimated Creatinine Clearance: 30.9 mL/min (A) (by C-G formula based on SCr of 2.28 mg/dL (H)).  Liver Function Tests: Recent Labs  Lab 06/17/21 0820 06/17/21 2200 06/18/21 0453  AST 23  --  20  ALT 12  --  12  ALKPHOS 98  --  74  BILITOT 0.7  --  0.8  PROT 6.1*  --  5.7*  ALBUMIN 3.5 2.4* 3.0*    CBG: Recent Labs  Lab 06/18/21 0013 06/18/21 0413 06/18/21 0746 06/18/21 1149  GLUCAP 80 89 104* 97     Recent Results (from the past 240 hour(s))  SARS CORONAVIRUS 2 (TAT 6-24 HRS) Nasopharyngeal Nasopharyngeal Swab     Status: None   Collection Time: 06/17/21  4:20 PM   Specimen: Nasopharyngeal Swab  Result Value  Ref Range Status   SARS Coronavirus 2 NEGATIVE NEGATIVE Final    Comment: (NOTE) SARS-CoV-2 target nucleic acids are NOT DETECTED.  The SARS-CoV-2 RNA is generally detectable in upper and lower respiratory specimens during the acute phase of infection. Negative results do not preclude SARS-CoV-2 infection, do not rule out co-infections with other pathogens, and should not be used as the sole basis for treatment or other patient management decisions. Negative results must be combined with clinical observations, patient history, and epidemiological information. The expected result is Negative.  Fact Sheet for Patients: SugarRoll.be  Fact Sheet for Healthcare Providers: https://www.woods-mathews.com/  This test is not yet approved or cleared by the [place] FDA and  has been authorized for detection and/or diagnosis of SARS-CoV-2 by FDA under an Emergency Use Authorization (EUA). This EUA will remain  in effect (meaning this  test can be used) for the duration of the COVID-19 declaration under Se ction 564(b)(1) of the Act, 21 U.S.C. section 360bbb-3(b)(1), unless the authorization is terminated or revoked sooner.  Performed at [facility], [place] [address], [place], [place] [address]          Radiology Studies: CT ABDOMEN PELVIS WO CONTRAST  Result Date: 06/17/2021 CLINICAL DATA:  Rule out bowel obstruction. History of pancreas cancer. EXAM: CT ABDOMEN AND PELVIS WITHOUT CONTRAST TECHNIQUE: Multidetector CT imaging of the abdomen and pelvis was performed following the standard protocol without IV contrast. COMPARISON:  05/23/2021 FINDINGS: Lower chest: No acute abnormality. Hepatobiliary: Within a background of diffuse hepatic steatosis there are multiple liver metastases as noted previously: The dominant lesion within the lateral segment of left hepatic lobe measures 7.8 cm, image 26/2. Previously this measured the same. The dominant lesion within the right hepatic lobe measures 8.2 cm, image 25/2. Previously 8 cm. Status post cholecystectomy. No biliary dilatation. Pancreas: Head of pancreas mass is suboptimally evaluated due to lack of IV contrast material. Subjectively, this does not appear significantly changed in size when compared with the previous contrast enhanced MRI. Spleen: Normal in size without focal abnormality. Adrenals/Urinary Tract: Normal adrenal glands. No hydronephrosis identified bilaterally. Punctate stone noted within inferior pole of left kidney. Exophytic cyst is again noted arising off the posterior cortex of the interpolar right kidney measuring 1.3 cm. Urinary bladder appears collapsed. Stomach/Bowel: Stomach is nondistended. The appendix is visualized and appears normal. No pathologic dilatation of the large or small bowel loops. No significant bowel wall thickening or surrounding inflammatory fat stranding. Vascular/Lymphatic: Mild aortic atherosclerosis. No aneurysm. No abdominopelvic  adenopathy identified. Reproductive: Status post hysterectomy. No adnexal masses.  The Other: No ascites or focal fluid collections. No peritoneal nodularity identified at this time. Musculoskeletal: No acute or significant osseous findings. IMPRESSION: 1. No acute findings within the abdomen or pelvis. No evidence for bowel obstruction. 2. Multiple liver metastases.  Similar to previous exam. 3. Head of pancreas mass is suboptimally evaluated due to lack of IV contrast material. Subjectively, this does not appear significantly changed in size when compared with the previous contrast enhanced MRI. 4. Aortic atherosclerosis. Aortic Atherosclerosis (ICD10-I70.0). Electronically Signed   By: Kerby Moors M.D.   On: 06/17/2021 19:07        Scheduled Meds:  Chlorhexidine Gluconate Cloth  6 each Topical Daily   heparin lock flush  500 Units Intracatheter Q30 days   mometasone-formoterol  2 puff Inhalation BID   nystatin  5 mL Oral QID   sodium chloride flush  10-40 mL Intracatheter Q12H   Continuous  Infusions:  dextrose 5 % and 0.9% NaCl 125 mL/hr at 06/18/21 0848   fluconazole (DIFLUCAN) IV 200 mg (06/17/21 1748)   potassium chloride 10 mEq (06/18/21 1214)   promethazine (PHENERGAN) injection (IM or IVPB) 12.5 mg (06/17/21 1700)     LOS: 1 day    Time spent: 40 minutes    Irine Seal, MD [facility]   To contact the attending provider between 7A-7P or the covering provider during after hours 7P-7A, please log into the web site [email or web address] and access using universal [place] password for that web site. If you do not have the password, please call the hospital operator.  06/18/2021, 12:46 PM

## 2021-06-18 NOTE — Consult Note (Signed)
Referring Provider: [facility] Primary Care Physician:  Patient, No Pcp Per (Inactive) Primary Gastroenterologist: Althia Forts  Reason for Consultation: Nausea and vomiting,  HPI: Wendy Hoffman is a 55 y.o. female with past medical history of metastatic pancreatic cancer with liver lesion as well as EGD in March showing large infiltrative duodenal mass.  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  Patient is s/p radiation and chemotherapy.  She was seen by oncology yesterday and was admitted to the hospital for persistent nausea and vomiting and rule out duodenal obstruction . CT abdomen pelvis without contrast negative for any obstruction.  GI is consulted for further evaluation.  Patient seen and examined at bedside.  Chart reviewed.  According to patient, her nausea and vomiting is stable since March 2022.  Her nausea and vomiting are random in occurrence but they are worse after meals.  Complaining of generalized upper abdominal pain.  Denies any diarrhea or constipation.  Denies any sensation of food getting stuck in esophagus.  Past Medical History:  Diagnosis Date   Anemia    Anxiety    ARF (acute renal failure) ([place]) 06/18/2021   Arthritis    Asthma    Cervical ca ([place])    H/O: hysterectomy    High cholesterol    Hypertension    Hypothyroidism    Insomnia    Medical history non-contributory    Migraine    Morbid obesity ([place])    Pancreatic cancer ([place]) 02/2021   Shortness of breath    Stroke [facility])    Thyroid disease     Past Surgical History:  Procedure Laterality Date   ABDOMINAL HYSTERECTOMY     BIOPSY  02/19/2021   Procedure: BIOPSY;  Surgeon: Mauri Pole, MD;  Location: [facility] ENDOSCOPY;  Service: Endoscopy;;   CESAREAN SECTION     3 occasions   CHOLECYSTECTOMY     ESOPHAGOGASTRODUODENOSCOPY (EGD) WITH PROPOFOL N/A 02/19/2021   Procedure: ESOPHAGOGASTRODUODENOSCOPY (EGD) WITH PROPOFOL;  Surgeon: Mauri Pole, MD;  Location: [facility];   Service: Endoscopy;  Laterality: N/A;   FLEXIBLE SIGMOIDOSCOPY N/A 02/19/2021   Procedure: FLEXIBLE SIGMOIDOSCOPY;  Surgeon: Mauri Pole, MD;  Location: [facility]OS[facility];  Service: Endoscopy;  Laterality: N/A;   IR IMAGING GUIDED PORT INSERTION  02/24/2021   IR US GUIDE BX ASP/DRAIN  02/24/2021   THYROIDECTOMY, PARTIAL     Goiter    Prior to Admission medications   Medication Sig Start Date End Date Taking? Authorizing Provider  albuterol (VENTOLIN HFA) 108 (90 Base) MCG/ACT inhaler INHALE 1 TO 2 PUFFS BY MOUTH EVERY 6 HOURS AS NEEDED FOR WHEEZING AND FOR SHORTNESS OF BREATH Patient taking differently: Inhale 1-2 puffs into the lungs every 6 (six) hours as needed for wheezing or shortness of breath. 01/06/21 01/06/22 Yes Millsaps, Joelene Millin, NP  gabapentin (NEURONTIN) 300 MG capsule TAKE 1 CAPSULE AS NEEDED BY ORAL ROUTE AT BEDTIME. Patient taking differently: Take 300 mg by mouth at bedtime as needed (neuropathy). 01/06/21 01/06/22 Yes Millsaps, Joelene Millin, NP  lidocaine-prilocaine (EMLA) cream Apply 1 application topically as needed. Patient taking differently: Apply 1 application topically as needed (port access). 03/06/21  Yes Ladell Pier, MD  magic mouthwash SOLN Take 5-10 mLs by mouth 4 (four) times daily as needed for mouth pain (swish and spit for mouth pain). 04/06/21  Yes Ladell Pier, MD  morphine (MS CONTIN) 30 MG 12 hr tablet Take 1 tablet (30 mg total) by mouth every 12 (twelve) hours. 05/20/21  Yes Owens Shark,  NP  oxyCODONE-acetaminophen (PERCOCET) 7.5-325 MG tablet Take 1 tablet by mouth every 4 (four) hours as needed for severe pain. 05/26/21  Yes Ladell Pier, MD  potassium chloride (MICRO-K) 10 MEQ CR capsule Take 2 capsules (20 mEq total) by mouth 2 (two) times daily. 06/05/21  Yes Ladell Pier, MD  prochlorperazine (COMPAZINE) 10 MG tablet TAKE 1 TABLET(10 MG) BY MOUTH EVERY 6 HOURS AS NEEDED FOR NAUSEA OR VOMITING Patient taking differently: Take 10 mg by mouth  every 6 (six) hours as needed for nausea or vomiting. 05/04/21  Yes Ladell Pier, MD  SYMBICORT 160-4.5 MCG/ACT inhaler INHALE 2 PUFFS INTO THE LUNGS 2 (TWO) TIMES DAILY. Patient taking differently: Inhale 2 puffs into the lungs 2 (two) times daily. 03/07/20  Yes Charlott Rakes, MD  zolpidem (AMBIEN) 10 MG tablet Take 1 tablet (10 mg total) by mouth at bedtime as needed for sleep. Patient taking differently: Take 10 mg by mouth at bedtime. 04/14/21  Yes Owens Shark, NP  albuterol (PROVENTIL) (2.5 MG/3ML) 0.083% nebulizer solution Take 3 mLs (2.5 mg total) by nebulization every 4 (four) hours as needed for wheezing or shortness of breath. Patient not taking: No sig reported 38/1/01   Delora Fuel, MD  amLODipine (NORVASC) 10 MG tablet TAKE 1 TABLET BY MOUTH ONCE DAILY Patient not taking: No sig reported 01/06/21 01/06/22  Millsaps, Joelene Millin, NP  budesonide-formoterol (SYMBICORT) 160-4.5 MCG/ACT inhaler INHALE 2 PUFFS INTO THE LUNGS 2 (TWO) TIMES DAILY. Patient not taking: Reported on 06/17/2021 01/06/21 01/06/22  Everardo Beals, NP  dexlansoprazole (DEXILANT) 60 MG capsule TAKE 1 CAPSULE BY MOUTH ONCE DAILY Patient not taking: No sig reported 01/06/21 01/06/22  Everardo Beals, NP  fluconazole (DIFLUCAN) 100 MG tablet Take 1 tablet (100 mg total) by mouth daily for 5 days. Patient not taking: Reported on 06/17/2021 06/17/21 06/22/21  Owens Shark, NP  magnesium oxide (MAG-OX) 400 (240 Mg) MG tablet Take 1 tablet (400 mg total) by mouth daily. Patient not taking: Reported on 06/17/2021 04/27/21   Owens Shark, NP  methocarbamol (ROBAXIN) 750 MG tablet TAKE 1 TABLET 3 TIMES A DAY BY ORAL ROUTE AS NEEDED FOR 30 DAYS. Patient not taking: Reported on 06/17/2021 01/06/21 01/06/22  Everardo Beals, NP  ondansetron (ZOFRAN) 4 MG tablet Take 1 tablet (4 mg total) by mouth every 6 (six) hours as needed for nausea. Patient not taking: No sig reported 04/14/21   Owens Shark, NP  pantoprazole (PROTONIX) 40 MG  tablet Take 1 tablet (40 mg total) by mouth 2 (two) times daily. Patient not taking: Reported on 06/17/2021 02/28/21   Allie Bossier, MD  polyethylene glycol (MIRALAX / GLYCOLAX) 17 g packet Take 17 g by mouth daily as needed for moderate constipation. Patient not taking: No sig reported 02/28/21   Allie Bossier, MD  rosuvastatin (CRESTOR) 5 MG tablet TAKE 1 TABLET BY MOUTH ONCE DAILY Patient not taking: No sig reported 01/06/21 01/06/22  Everardo Beals, NP  topiramate (TOPAMAX) 50 MG tablet Take 1 tablet (50 mg total) by mouth 2 (two) times daily. Patient not taking: No sig reported 11/19/19   Charlott Rakes, MD  fluticasone (FLONASE) 50 MCG/ACT nasal spray Place 1 spray into both nostrils daily. Patient not taking: Reported on 03/21/2019 12/29/17 06/29/19  Petrucelli, Glynda Jaeger, PA-C    Scheduled Meds:  Chlorhexidine Gluconate Cloth  6 each Topical Daily   heparin lock flush  500 Units Intracatheter Q30 days   mometasone-formoterol  2 puff Inhalation  BID   nystatin  5 mL Oral QID   sodium chloride flush  10-40 mL Intracatheter Q12H   Continuous Infusions:  dextrose 5 % and 0.9% NaCl 125 mL/hr at 06/18/21 0848   fluconazole (DIFLUCAN) IV 200 mg (06/17/21 1748)   magnesium sulfate bolus IVPB 2 g (06/18/21 0848)   potassium chloride     promethazine (PHENERGAN) injection (IM or IVPB) 12.5 mg (06/17/21 1700)   sodium chloride     PRN Meds:.albuterol, heparin lock flush **AND** heparin lock flush, HYDROmorphone (DILAUDID) injection, lidocaine-prilocaine, prochlorperazine, promethazine (PHENERGAN) injection (IM or IVPB), sodium chloride flush  Allergies as of 06/17/2021 - Review Complete 06/17/2021  Allergen Reaction Noted   Blueberry [vaccinium angustifolium] Anaphylaxis 04/30/2013   Mango flavor Anaphylaxis 04/30/2013   Oxaliplatin Itching and Other (See Comments) 04/15/2021   Reglan [metoclopramide] Nausea And Vomiting 02/24/2021   Aspirin Rash    Penicillins Hives     Family  History  Problem Relation Age of Onset   Migraines Sister    Non-Hodgkin's lymphoma Other        maternal half brother's grandson   Non-Hodgkin's lymphoma Half-Brother        maternal half brother; dx 16s or 3s   Pancreatic cancer Other 60       MGF's niece    Social History   Socioeconomic History   Marital status: Single    Spouse name: Not on file   Number of children: Not on file   Years of education: Not on file   Highest education level: Not on file  Occupational History   Not on file  Tobacco Use   Smoking status: Former    Pack years: 0.00    Types: Cigarettes   Smokeless tobacco: Never  Vaping Use   Vaping Use: Never used  Substance and Sexual Activity   Alcohol use: No   Drug use: No   Sexual activity: Not on file  Other Topics Concern   Not on file  Social History Narrative   Not on file   Social Determinants of Health   Financial Resource Strain: Not on file  Food Insecurity: Not on file  Transportation Needs: Not on file  Physical Activity: Not on file  Stress: Not on file  Social Connections: Not on file  Intimate Partner Violence: Not on file    Review of Systems: 12 point review of system is done which is negative except as mentioned in HPI  Physical Exam: Vital signs: Vitals:   06/18/21 0009 06/18/21 0412  BP: 101/64 113/76  Pulse: 70 66  Resp: 18 18  Temp: 98.1 F (36.7 C) 98.1 F (36.7 C)  SpO2: 96% 93%   Last BM Date: 06/16/21 Physical Exam Constitutional:      General: She is not in acute distress.    Appearance: Normal appearance.  HENT:     Head: Normocephalic and atraumatic.     Right Ear: External ear normal.     Left Ear: External ear normal.     Nose: Nose normal.     Mouth/Throat:     Mouth: Mucous membranes are moist.     Comments: Minimal exudate seen Eyes:     General: No scleral icterus.    Extraocular Movements: Extraocular movements intact.  Cardiovascular:     Rate and Rhythm: Normal rate and regular  rhythm.     Heart sounds: No murmur heard. Pulmonary:     Effort: Pulmonary effort is normal. No respiratory distress.  Comments: Anterior exam only Abdominal:     General: Bowel sounds are normal. There is no distension.     Palpations: Abdomen is soft. There is no mass.     Tenderness: There is no guarding.     Comments: Epigastric tenderness to palpation  Musculoskeletal:        General: No swelling.     Cervical back: Normal range of motion.     Right lower leg: No edema.     Left lower leg: No edema.  Skin:    General: Skin is warm.     Coloration: Skin is not jaundiced.  Neurological:     Mental Status: She is alert and oriented to person, place, and time.  Psychiatric:        Mood and Affect: Mood normal.        Behavior: Behavior normal.        Thought Content: Thought content normal.        Judgment: Judgment normal.     GI:  Lab Results: Recent Labs    06/17/21 0820 06/18/21 0453  WBC 6.2 4.9  HGB 13.2 10.8*  HCT 40.4 34.1*  PLT 220 171   BMET Recent Labs    06/17/21 0820 06/17/21 2200 06/18/21 0453  NA 137 139 137  K 2.4* 2.0* 3.0*  CL 93* 106 97*  CO2 29 24 30   GLUCOSE 105* 66* 89  BUN 39* 32* 36*  CREATININE 3.25* 2.04* 2.28*  CALCIUM 8.7* 6.7* 8.6*   LFT Recent Labs    06/18/21 0453  PROT 5.7*  ALBUMIN 3.0*  AST 20  ALT 12  ALKPHOS 74  BILITOT 0.8   PT/INR No results for input(s): LABPROT, INR in the last 72 hours.   Studies/Results: CT ABDOMEN PELVIS WO CONTRAST  Result Date: 06/17/2021 CLINICAL DATA:  Rule out bowel obstruction. History of pancreas cancer. EXAM: CT ABDOMEN AND PELVIS WITHOUT CONTRAST TECHNIQUE: Multidetector CT imaging of the abdomen and pelvis was performed following the standard protocol without IV contrast. COMPARISON:  05/23/2021 FINDINGS: Lower chest: No acute abnormality. Hepatobiliary: Within a background of diffuse hepatic steatosis there are multiple liver metastases as noted previously: The dominant  lesion within the lateral segment of left hepatic lobe measures 7.8 cm, image 26/2. Previously this measured the same. The dominant lesion within the right hepatic lobe measures 8.2 cm, image 25/2. Previously 8 cm. Status post cholecystectomy. No biliary dilatation. Pancreas: Head of pancreas mass is suboptimally evaluated due to lack of IV contrast material. Subjectively, this does not appear significantly changed in size when compared with the previous contrast enhanced MRI. Spleen: Normal in size without focal abnormality. Adrenals/Urinary Tract: Normal adrenal glands. No hydronephrosis identified bilaterally. Punctate stone noted within inferior pole of left kidney. Exophytic cyst is again noted arising off the posterior cortex of the interpolar right kidney measuring 1.3 cm. Urinary bladder appears collapsed. Stomach/Bowel: Stomach is nondistended. The appendix is visualized and appears normal. No pathologic dilatation of the large or small bowel loops. No significant bowel wall thickening or surrounding inflammatory fat stranding. Vascular/Lymphatic: Mild aortic atherosclerosis. No aneurysm. No abdominopelvic adenopathy identified. Reproductive: Status post hysterectomy. No adnexal masses.  The Other: No ascites or focal fluid collections. No peritoneal nodularity identified at this time. Musculoskeletal: No acute or significant osseous findings. IMPRESSION: 1. No acute findings within the abdomen or pelvis. No evidence for bowel obstruction. 2. Multiple liver metastases.  Similar to previous exam. 3. Head of pancreas mass is suboptimally evaluated due to  lack of IV contrast material. Subjectively, this does not appear significantly changed in size when compared with the previous contrast enhanced MRI. 4. Aortic atherosclerosis. Aortic Atherosclerosis (ICD10-I70.0). Electronically Signed   By: Kerby Moors M.D.   On: 06/17/2021 19:07    Impression/Plan: -Chronic nausea and vomiting since March 2022 in a  patient with metastatic pancreatic/duodenal adenocarcinoma.  Recommendations ------------------------- -Continue supportive care -Okay to have full liquid diet today -Plan for EGD tomorrow. -Keep n.p.o. past midnight  Risks (bleeding, infection, bowel perforation that could require surgery, sedation-related changes in cardiopulmonary systems), benefits (identification and possible treatment of source of symptoms, exclusion of certain causes of symptoms), and alternatives (watchful waiting, radiographic imaging studies, empiric medical treatment)  were explained to patient/family in detail and patient wishes to proceed.       LOS: 1 day   Otis Brace  MD, FACP 06/18/2021, 8:50 AM  Contact #  [phone]

## 2021-06-18 NOTE — Anesthesia Preprocedure Evaluation (Addendum)
Anesthesia Evaluation  Patient identified by MRN, date of birth, ID band Patient awake    Reviewed: Allergy & Precautions, NPO status , Patient's Chart, lab work & pertinent test results  History of Anesthesia Complications Negative for: history of anesthetic complications  Airway Mallampati: II  TM Distance: >3 FB Neck ROM: Full    Dental  (+) Edentulous Upper, Missing, Poor Dentition,    Pulmonary asthma , COPD,  COPD inhaler, former smoker,    Pulmonary exam normal        Cardiovascular hypertension, Pt. on medications Normal cardiovascular exam     Neuro/Psych  Headaches, Anxiety CVA ("20 years ago")    GI/Hepatic Neg liver ROS, GERD  Medicated,Pancreatic ca, duodenal mass, intractable N/V   Endo/Other  Hypothyroidism   Renal/GU negative Renal ROS  negative genitourinary   Musculoskeletal  (+) Arthritis ,   Abdominal   Peds  Hematology negative hematology ROS (+)   Anesthesia Other Findings Day of surgery medications reviewed with patient.  Reproductive/Obstetrics negative OB ROS                            Anesthesia Physical Anesthesia Plan  ASA: 3  Anesthesia Plan: MAC   Post-op Pain Management:    Induction:   PONV Risk Score and Plan: Treatment may vary due to age or medical condition and Propofol infusion  Airway Management Planned: Natural Airway and Nasal Cannula  Additional Equipment: None  Intra-op Plan:   Post-operative Plan:   Informed Consent: I have reviewed the patients History and Physical, chart, labs and discussed the procedure including the risks, benefits and alternatives for the proposed anesthesia with the patient or authorized representative who has indicated his/her understanding and acceptance.       Plan Discussed with: CRNA  Anesthesia Plan Comments:        Anesthesia Quick Evaluation

## 2021-06-18 NOTE — Progress Notes (Signed)
Initial Nutrition Assessment  DOCUMENTATION CODES:   Obesity unspecified, Non-severe (moderate) malnutrition in context of chronic illness  INTERVENTION:   -Recommend Ensure Enlive po BID, each supplement provides 350 kcal and 20 grams of protein Following EGD 7/8  -Encouraged PO intake as tolerated  NUTRITION DIAGNOSIS:   Moderate Malnutrition related to chronic illness, cancer and cancer related treatments as evidenced by percent weight loss, energy intake < or equal to 75% for > or equal to 1 month, mild muscle depletion.  GOAL:   Patient will meet greater than or equal to 90% of their needs  MONITOR:   PO intake, Supplement acceptance, Labs, Weight trends, I & O's  REASON FOR ASSESSMENT:   Malnutrition Screening Tool    ASSESSMENT:   55 y.o. female with past medical history of metastatic pancreatic cancer with liver lesion as well as EGD in March showing large infiltrative duodenal mass.  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  Patient is s/p radiation and chemotherapy.  She was seen by oncology yesterday and was admitted to the hospital for persistent nausea and vomiting and rule out duodenal obstruction . CT abdomen pelvis without contrast negative for any obstruction.  Patient in room, sleepy after receiving nausea medication. Pt states that at home she has had a difficult time keeping foods and liquids down. Pt states within 15-20 minutes of eating, she vomits. Pt with thrush, which is being treated. Pt states she was unable to taste much, "things tasted like cardboard".  Last chemotherapy was 6/15 for metastatic pancreatic cancer.  Pt on full liquids today. Pt drank some broth during visit. Pt does not want supplements today. Will be NPO tomorrow for EGD. Is open to having supplements after that. States she did stop drinking Ensure at home as they would not stay down.  Per pt she has lost 100 lbs since January 2022. UBW is ~298 lbs. Pt has lost 72  lbs since 3/25 (26% wt loss x 3.5 months, significant for time frame).  Medications: D5 infusion, IV Mg sulfate, IV KCl, IV Compazine  Labs reviewed: CBGs: 80-104 Low K   NUTRITION - FOCUSED PHYSICAL EXAM:  Flowsheet Row Most Recent Value  Orbital Region No depletion  Upper Arm Region Mild depletion  Thoracic and Lumbar Region Unable to assess  Buccal Region No depletion  Temple Region Mild depletion  Clavicle Bone Region No depletion  Clavicle and Acromion Bone Region No depletion  Scapular Bone Region No depletion  Dorsal Hand Mild depletion  Patellar Region Unable to assess  Anterior Thigh Region Unable to assess  Posterior Calf Region Unable to assess  Edema (RD Assessment) None  Hair Reviewed  Eyes Reviewed  Mouth Reviewed       Diet Order:   Diet Order             Diet NPO time specified  Diet effective midnight           Diet full liquid Room service appropriate? Yes; Fluid consistency: Thin  Diet effective now                   EDUCATION NEEDS:   No education needs have been identified at this time  Skin:  Skin Assessment: Reviewed RN Assessment  Last BM:  7/5  Height:   Ht Readings from Last 1 Encounters:  06/17/21 5\' 4"  (1.626 m)    Weight:   Wt Readings from Last 1 Encounters:  06/17/21 89.8 kg    BMI: 33.8  kg/m^2  Estimated Nutritional Needs:   Kcal:  1900-2100  Protein:  85-95g  Fluid:  2L/day   Clayton Bibles, MS, RD, LDN Inpatient Clinical Dietitian Contact information available via Amion

## 2021-06-18 NOTE — H&P (View-Only) (Signed)
Referring Provider: [facility] Primary Care Physician:  Patient, No Pcp Per (Inactive) Primary Gastroenterologist: Althia Forts  Reason for Consultation: Nausea and vomiting,  HPI: Wendy Hoffman is a 55 y.o. female with past medical history of metastatic pancreatic cancer with liver lesion as well as EGD in March showing large infiltrative duodenal mass.  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  Patient is s/p radiation and chemotherapy.  She was seen by oncology yesterday and was admitted to the hospital for persistent nausea and vomiting and rule out duodenal obstruction . CT abdomen pelvis without contrast negative for any obstruction.  GI is consulted for further evaluation.  Patient seen and examined at bedside.  Chart reviewed.  According to patient, her nausea and vomiting is stable since March 2022.  Her nausea and vomiting are random in occurrence but they are worse after meals.  Complaining of generalized upper abdominal pain.  Denies any diarrhea or constipation.  Denies any sensation of food getting stuck in esophagus.  Past Medical History:  Diagnosis Date   Anemia    Anxiety    ARF (acute renal failure) ([place]) 06/18/2021   Arthritis    Asthma    Cervical ca ([place])    H/O: hysterectomy    High cholesterol    Hypertension    Hypothyroidism    Insomnia    Medical history non-contributory    Migraine    Morbid obesity ([place])    Pancreatic cancer ([place]) 02/2021   Shortness of breath    Stroke [facility])    Thyroid disease     Past Surgical History:  Procedure Laterality Date   ABDOMINAL HYSTERECTOMY     BIOPSY  02/19/2021   Procedure: BIOPSY;  Surgeon: Mauri Pole, MD;  Location: [facility];  Service: Endoscopy;;   CESAREAN SECTION     3 occasions   CHOLECYSTECTOMY     ESOPHAGOGASTRODUODENOSCOPY (EGD) WITH PROPOFOL N/A 02/19/2021   Procedure: ESOPHAGOGASTRODUODENOSCOPY (EGD) WITH PROPOFOL;  Surgeon: Mauri Pole, MD;  Location: [facility];   Service: Endoscopy;  Laterality: N/A;   FLEXIBLE SIGMOIDOSCOPY N/A 02/19/2021   Procedure: FLEXIBLE SIGMOIDOSCOPY;  Surgeon: Mauri Pole, MD;  Location: [facility];  Service: Endoscopy;  Laterality: N/A;   IR IMAGING GUIDED PORT INSERTION  02/24/2021   IR US GUIDE BX ASP/DRAIN  02/24/2021   THYROIDECTOMY, PARTIAL     Goiter    Prior to Admission medications   Medication Sig Start Date End Date Taking? Authorizing Provider  albuterol (VENTOLIN HFA) 108 (90 Base) MCG/ACT inhaler INHALE 1 TO 2 PUFFS BY MOUTH EVERY 6 HOURS AS NEEDED FOR WHEEZING AND FOR SHORTNESS OF BREATH Patient taking differently: Inhale 1-2 puffs into the lungs every 6 (six) hours as needed for wheezing or shortness of breath. 01/06/21 01/06/22 Yes Millsaps, Joelene Millin, NP  gabapentin (NEURONTIN) 300 MG capsule TAKE 1 CAPSULE AS NEEDED BY ORAL ROUTE AT BEDTIME. Patient taking differently: Take 300 mg by mouth at bedtime as needed (neuropathy). 01/06/21 01/06/22 Yes Millsaps, Joelene Millin, NP  lidocaine-prilocaine (EMLA) cream Apply 1 application topically as needed. Patient taking differently: Apply 1 application topically as needed (port access). 03/06/21  Yes Ladell Pier, MD  magic mouthwash SOLN Take 5-10 mLs by mouth 4 (four) times daily as needed for mouth pain (swish and spit for mouth pain). 04/06/21  Yes Ladell Pier, MD  morphine (MS CONTIN) 30 MG 12 hr tablet Take 1 tablet (30 mg total) by mouth every 12 (twelve) hours. 05/20/21  Yes Owens Shark,  NP  oxyCODONE-acetaminophen (PERCOCET) 7.5-325 MG tablet Take 1 tablet by mouth every 4 (four) hours as needed for severe pain. 05/26/21  Yes Ladell Pier, MD  potassium chloride (MICRO-K) 10 MEQ CR capsule Take 2 capsules (20 mEq total) by mouth 2 (two) times daily. 06/05/21  Yes Ladell Pier, MD  prochlorperazine (COMPAZINE) 10 MG tablet TAKE 1 TABLET(10 MG) BY MOUTH EVERY 6 HOURS AS NEEDED FOR NAUSEA OR VOMITING Patient taking differently: Take 10 mg by mouth  every 6 (six) hours as needed for nausea or vomiting. 05/04/21  Yes Ladell Pier, MD  SYMBICORT 160-4.5 MCG/ACT inhaler INHALE 2 PUFFS INTO THE LUNGS 2 (TWO) TIMES DAILY. Patient taking differently: Inhale 2 puffs into the lungs 2 (two) times daily. 03/07/20  Yes Charlott Rakes, MD  zolpidem (AMBIEN) 10 MG tablet Take 1 tablet (10 mg total) by mouth at bedtime as needed for sleep. Patient taking differently: Take 10 mg by mouth at bedtime. 04/14/21  Yes Owens Shark, NP  albuterol (PROVENTIL) (2.5 MG/3ML) 0.083% nebulizer solution Take 3 mLs (2.5 mg total) by nebulization every 4 (four) hours as needed for wheezing or shortness of breath. Patient not taking: No sig reported 12/19/99   Delora Fuel, MD  amLODipine (NORVASC) 10 MG tablet TAKE 1 TABLET BY MOUTH ONCE DAILY Patient not taking: No sig reported 01/06/21 01/06/22  Millsaps, Joelene Millin, NP  budesonide-formoterol (SYMBICORT) 160-4.5 MCG/ACT inhaler INHALE 2 PUFFS INTO THE LUNGS 2 (TWO) TIMES DAILY. Patient not taking: Reported on 06/17/2021 01/06/21 01/06/22  Everardo Beals, NP  dexlansoprazole (DEXILANT) 60 MG capsule TAKE 1 CAPSULE BY MOUTH ONCE DAILY Patient not taking: No sig reported 01/06/21 01/06/22  Everardo Beals, NP  fluconazole (DIFLUCAN) 100 MG tablet Take 1 tablet (100 mg total) by mouth daily for 5 days. Patient not taking: Reported on 06/17/2021 06/17/21 06/22/21  Owens Shark, NP  magnesium oxide (MAG-OX) 400 (240 Mg) MG tablet Take 1 tablet (400 mg total) by mouth daily. Patient not taking: Reported on 06/17/2021 04/27/21   Owens Shark, NP  methocarbamol (ROBAXIN) 750 MG tablet TAKE 1 TABLET 3 TIMES A DAY BY ORAL ROUTE AS NEEDED FOR 30 DAYS. Patient not taking: Reported on 06/17/2021 01/06/21 01/06/22  Everardo Beals, NP  ondansetron (ZOFRAN) 4 MG tablet Take 1 tablet (4 mg total) by mouth every 6 (six) hours as needed for nausea. Patient not taking: No sig reported 04/14/21   Owens Shark, NP  pantoprazole (PROTONIX) 40 MG  tablet Take 1 tablet (40 mg total) by mouth 2 (two) times daily. Patient not taking: Reported on 06/17/2021 02/28/21   Allie Bossier, MD  polyethylene glycol (MIRALAX / GLYCOLAX) 17 g packet Take 17 g by mouth daily as needed for moderate constipation. Patient not taking: No sig reported 02/28/21   Allie Bossier, MD  rosuvastatin (CRESTOR) 5 MG tablet TAKE 1 TABLET BY MOUTH ONCE DAILY Patient not taking: No sig reported 01/06/21 01/06/22  Everardo Beals, NP  topiramate (TOPAMAX) 50 MG tablet Take 1 tablet (50 mg total) by mouth 2 (two) times daily. Patient not taking: No sig reported 11/19/19   Charlott Rakes, MD  fluticasone (FLONASE) 50 MCG/ACT nasal spray Place 1 spray into both nostrils daily. Patient not taking: Reported on 03/21/2019 12/29/17 06/29/19  Petrucelli, Glynda Jaeger, PA-C    Scheduled Meds:  Chlorhexidine Gluconate Cloth  6 each Topical Daily   heparin lock flush  500 Units Intracatheter Q30 days   mometasone-formoterol  2 puff Inhalation  BID   nystatin  5 mL Oral QID   sodium chloride flush  10-40 mL Intracatheter Q12H   Continuous Infusions:  dextrose 5 % and 0.9% NaCl 125 mL/hr at 06/18/21 0848   fluconazole (DIFLUCAN) IV 200 mg (06/17/21 1748)   magnesium sulfate bolus IVPB 2 g (06/18/21 0848)   potassium chloride     promethazine (PHENERGAN) injection (IM or IVPB) 12.5 mg (06/17/21 1700)   sodium chloride     PRN Meds:.albuterol, heparin lock flush **AND** heparin lock flush, HYDROmorphone (DILAUDID) injection, lidocaine-prilocaine, prochlorperazine, promethazine (PHENERGAN) injection (IM or IVPB), sodium chloride flush  Allergies as of 06/17/2021 - Review Complete 06/17/2021  Allergen Reaction Noted   Blueberry [vaccinium angustifolium] Anaphylaxis 04/30/2013   Mango flavor Anaphylaxis 04/30/2013   Oxaliplatin Itching and Other (See Comments) 04/15/2021   Reglan [metoclopramide] Nausea And Vomiting 02/24/2021   Aspirin Rash    Penicillins Hives     Family  History  Problem Relation Age of Onset   Migraines Sister    Non-Hodgkin's lymphoma Other        maternal half brother's grandson   Non-Hodgkin's lymphoma Half-Brother        maternal half brother; dx 53s or 22s   Pancreatic cancer Other 67       MGF's niece    Social History   Socioeconomic History   Marital status: Single    Spouse name: Not on file   Number of children: Not on file   Years of education: Not on file   Highest education level: Not on file  Occupational History   Not on file  Tobacco Use   Smoking status: Former    Pack years: 0.00    Types: Cigarettes   Smokeless tobacco: Never  Vaping Use   Vaping Use: Never used  Substance and Sexual Activity   Alcohol use: No   Drug use: No   Sexual activity: Not on file  Other Topics Concern   Not on file  Social History Narrative   Not on file   Social Determinants of Health   Financial Resource Strain: Not on file  Food Insecurity: Not on file  Transportation Needs: Not on file  Physical Activity: Not on file  Stress: Not on file  Social Connections: Not on file  Intimate Partner Violence: Not on file    Review of Systems: 12 point review of system is done which is negative except as mentioned in HPI  Physical Exam: Vital signs: Vitals:   06/18/21 0009 06/18/21 0412  BP: 101/64 113/76  Pulse: 70 66  Resp: 18 18  Temp: 98.1 F (36.7 C) 98.1 F (36.7 C)  SpO2: 96% 93%   Last BM Date: 06/16/21 Physical Exam Constitutional:      General: She is not in acute distress.    Appearance: Normal appearance.  HENT:     Head: Normocephalic and atraumatic.     Right Ear: External ear normal.     Left Ear: External ear normal.     Nose: Nose normal.     Mouth/Throat:     Mouth: Mucous membranes are moist.     Comments: Minimal exudate seen Eyes:     General: No scleral icterus.    Extraocular Movements: Extraocular movements intact.  Cardiovascular:     Rate and Rhythm: Normal rate and regular  rhythm.     Heart sounds: No murmur heard. Pulmonary:     Effort: Pulmonary effort is normal. No respiratory distress.  Comments: Anterior exam only Abdominal:     General: Bowel sounds are normal. There is no distension.     Palpations: Abdomen is soft. There is no mass.     Tenderness: There is no guarding.     Comments: Epigastric tenderness to palpation  Musculoskeletal:        General: No swelling.     Cervical back: Normal range of motion.     Right lower leg: No edema.     Left lower leg: No edema.  Skin:    General: Skin is warm.     Coloration: Skin is not jaundiced.  Neurological:     Mental Status: She is alert and oriented to person, place, and time.  Psychiatric:        Mood and Affect: Mood normal.        Behavior: Behavior normal.        Thought Content: Thought content normal.        Judgment: Judgment normal.     GI:  Lab Results: Recent Labs    06/17/21 0820 06/18/21 0453  WBC 6.2 4.9  HGB 13.2 10.8*  HCT 40.4 34.1*  PLT 220 171   BMET Recent Labs    06/17/21 0820 06/17/21 2200 06/18/21 0453  NA 137 139 137  K 2.4* 2.0* 3.0*  CL 93* 106 97*  CO2 29 24 30   GLUCOSE 105* 66* 89  BUN 39* 32* 36*  CREATININE 3.25* 2.04* 2.28*  CALCIUM 8.7* 6.7* 8.6*   LFT Recent Labs    06/18/21 0453  PROT 5.7*  ALBUMIN 3.0*  AST 20  ALT 12  ALKPHOS 74  BILITOT 0.8   PT/INR No results for input(s): LABPROT, INR in the last 72 hours.   Studies/Results: CT ABDOMEN PELVIS WO CONTRAST  Result Date: 06/17/2021 CLINICAL DATA:  Rule out bowel obstruction. History of pancreas cancer. EXAM: CT ABDOMEN AND PELVIS WITHOUT CONTRAST TECHNIQUE: Multidetector CT imaging of the abdomen and pelvis was performed following the standard protocol without IV contrast. COMPARISON:  05/23/2021 FINDINGS: Lower chest: No acute abnormality. Hepatobiliary: Within a background of diffuse hepatic steatosis there are multiple liver metastases as noted previously: The dominant  lesion within the lateral segment of left hepatic lobe measures 7.8 cm, image 26/2. Previously this measured the same. The dominant lesion within the right hepatic lobe measures 8.2 cm, image 25/2. Previously 8 cm. Status post cholecystectomy. No biliary dilatation. Pancreas: Head of pancreas mass is suboptimally evaluated due to lack of IV contrast material. Subjectively, this does not appear significantly changed in size when compared with the previous contrast enhanced MRI. Spleen: Normal in size without focal abnormality. Adrenals/Urinary Tract: Normal adrenal glands. No hydronephrosis identified bilaterally. Punctate stone noted within inferior pole of left kidney. Exophytic cyst is again noted arising off the posterior cortex of the interpolar right kidney measuring 1.3 cm. Urinary bladder appears collapsed. Stomach/Bowel: Stomach is nondistended. The appendix is visualized and appears normal. No pathologic dilatation of the large or small bowel loops. No significant bowel wall thickening or surrounding inflammatory fat stranding. Vascular/Lymphatic: Mild aortic atherosclerosis. No aneurysm. No abdominopelvic adenopathy identified. Reproductive: Status post hysterectomy. No adnexal masses.  The Other: No ascites or focal fluid collections. No peritoneal nodularity identified at this time. Musculoskeletal: No acute or significant osseous findings. IMPRESSION: 1. No acute findings within the abdomen or pelvis. No evidence for bowel obstruction. 2. Multiple liver metastases.  Similar to previous exam. 3. Head of pancreas mass is suboptimally evaluated due to  lack of IV contrast material. Subjectively, this does not appear significantly changed in size when compared with the previous contrast enhanced MRI. 4. Aortic atherosclerosis. Aortic Atherosclerosis (ICD10-I70.0). Electronically Signed   By: Kerby Moors M.D.   On: 06/17/2021 19:07    Impression/Plan: -Chronic nausea and vomiting since March 2022 in a  patient with metastatic pancreatic/duodenal adenocarcinoma.  Recommendations ------------------------- -Continue supportive care -Okay to have full liquid diet today -Plan for EGD tomorrow. -Keep n.p.o. past midnight  Risks (bleeding, infection, bowel perforation that could require surgery, sedation-related changes in cardiopulmonary systems), benefits (identification and possible treatment of source of symptoms, exclusion of certain causes of symptoms), and alternatives (watchful waiting, radiographic imaging studies, empiric medical treatment)  were explained to patient/family in detail and patient wishes to proceed.       LOS: 1 day   Otis Brace  MD, FACP 06/18/2021, 8:50 AM  Contact #  [phone]

## 2021-06-19 ENCOUNTER — Encounter (HOSPITAL_COMMUNITY)
Admission: AD | Disposition: A | Discharge status: Home / Self Care | Payer: Self-pay | Source: Ambulatory Visit | Attending: Internal Medicine

## 2021-06-19 ENCOUNTER — Inpatient Hospital Stay: Payer: Medicaid Other

## 2021-06-19 ENCOUNTER — Encounter (HOSPITAL_COMMUNITY): Payer: Self-pay | Admitting: Internal Medicine

## 2021-06-19 ENCOUNTER — Inpatient Hospital Stay (HOSPITAL_COMMUNITY): Payer: Medicaid Other | Admitting: Anesthesiology

## 2021-06-19 DIAGNOSIS — E876 Hypokalemia: Secondary | ICD-10-CM | POA: Diagnosis not present

## 2021-06-19 DIAGNOSIS — N179 Acute kidney failure, unspecified: Secondary | ICD-10-CM | POA: Diagnosis not present

## 2021-06-19 DIAGNOSIS — K2900 Acute gastritis without bleeding: Secondary | ICD-10-CM

## 2021-06-19 DIAGNOSIS — C170 Malignant neoplasm of duodenum: Secondary | ICD-10-CM

## 2021-06-19 DIAGNOSIS — R112 Nausea with vomiting, unspecified: Secondary | ICD-10-CM | POA: Diagnosis not present

## 2021-06-19 HISTORY — PX: BIOPSY: SHX5522

## 2021-06-19 HISTORY — PX: ESOPHAGOGASTRODUODENOSCOPY (EGD) WITH PROPOFOL: SHX5813

## 2021-06-19 LAB — CBC
HCT: 32.8 % — ABNORMAL LOW (ref 36.0–46.0)
Hemoglobin: 10.4 g/dL — ABNORMAL LOW (ref 12.0–15.0)
MCH: 29.4 pg (ref 26.0–34.0)
MCHC: 31.7 g/dL (ref 30.0–36.0)
MCV: 92.7 fL (ref 80.0–100.0)
Platelets: 136 10*3/uL — ABNORMAL LOW (ref 150–400)
RBC: 3.54 MIL/uL — ABNORMAL LOW (ref 3.87–5.11)
RDW: 16.3 % — ABNORMAL HIGH (ref 11.5–15.5)
WBC: 5.2 10*3/uL (ref 4.0–10.5)
nRBC: 0 % (ref 0.0–0.2)

## 2021-06-19 LAB — RENAL FUNCTION PANEL
Albumin: 2.8 g/dL — ABNORMAL LOW (ref 3.5–5.0)
Anion gap: 9 (ref 5–15)
BUN: 25 mg/dL — ABNORMAL HIGH (ref 6–20)
CO2: 25 mmol/L (ref 22–32)
Calcium: 8.5 mg/dL — ABNORMAL LOW (ref 8.9–10.3)
Chloride: 105 mmol/L (ref 98–111)
Creatinine, Ser: 1.65 mg/dL — ABNORMAL HIGH (ref 0.44–1.00)
GFR, Estimated: 37 mL/min — ABNORMAL LOW (ref >60)
Glucose, Bld: 103 mg/dL — ABNORMAL HIGH (ref 70–99)
Phosphorus: 2.2 mg/dL — ABNORMAL LOW (ref 2.5–4.6)
Potassium: 3.7 mmol/L (ref 3.5–5.1)
Sodium: 139 mmol/L (ref 135–145)

## 2021-06-19 LAB — GLUCOSE, CAPILLARY
Glucose-Capillary: 101 mg/dL — ABNORMAL HIGH (ref 70–99)
Glucose-Capillary: 102 mg/dL — ABNORMAL HIGH (ref 70–99)
Glucose-Capillary: 104 mg/dL — ABNORMAL HIGH (ref 70–99)
Glucose-Capillary: 94 mg/dL (ref 70–99)

## 2021-06-19 LAB — MAGNESIUM: Magnesium: 2.2 mg/dL (ref 1.7–2.4)

## 2021-06-19 SURGERY — ESOPHAGOGASTRODUODENOSCOPY (EGD) WITH PROPOFOL
Anesthesia: Monitor Anesthesia Care

## 2021-06-19 MED ORDER — PROPOFOL 500 MG/50ML IV EMUL
INTRAVENOUS | Status: AC
  Filled 2021-06-19: qty 50

## 2021-06-19 MED ORDER — PROMETHAZINE HCL 25 MG/ML IJ SOLN
6.2500 mg | INTRAMUSCULAR | Status: DC | PRN
  Administered 2021-06-19: 12.5 mg via INTRAVENOUS
  Filled 2021-06-19: qty 1

## 2021-06-19 MED ORDER — SODIUM CHLORIDE 0.9 % IV SOLN: INTRAVENOUS | Status: DC

## 2021-06-19 MED ORDER — PROPOFOL 10 MG/ML IV BOLUS
INTRAVENOUS | Status: AC
  Filled 2021-06-19: qty 20

## 2021-06-19 MED ORDER — PROPOFOL 500 MG/50ML IV EMUL
INTRAVENOUS | Status: DC | PRN
  Administered 2021-06-19: 100 mg via INTRAVENOUS

## 2021-06-19 MED ORDER — LACTATED RINGERS IV SOLN: INTRAVENOUS | Status: DC | PRN

## 2021-06-19 MED ORDER — LEVOFLOXACIN IN D5W 500 MG/100ML IV SOLN
500.0000 mg | Freq: Every day | INTRAVENOUS | Status: DC
  Administered 2021-06-19 – 2021-06-20 (×2): 500 mg via INTRAVENOUS
  Filled 2021-06-19 (×2): qty 100

## 2021-06-19 MED ORDER — PROPOFOL 500 MG/50ML IV EMUL
INTRAVENOUS | Status: DC | PRN
  Administered 2021-06-19: 150 ug/kg/min via INTRAVENOUS

## 2021-06-19 MED ORDER — ONDANSETRON HCL 4 MG/2ML IJ SOLN
INTRAMUSCULAR | Status: DC | PRN
  Administered 2021-06-19: 4 mg via INTRAVENOUS

## 2021-06-19 MED ORDER — K PHOS MONO-SOD PHOS DI & MONO 155-852-130 MG PO TABS
250.0000 mg | ORAL_TABLET | Freq: Two times a day (BID) | ORAL | Status: AC
  Administered 2021-06-19 – 2021-06-21 (×5): 250 mg via ORAL
  Filled 2021-06-19 (×6): qty 1

## 2021-06-19 MED ORDER — SUCRALFATE 1 G PO TABS
1.0000 g | ORAL_TABLET | Freq: Three times a day (TID) | ORAL | Status: DC
  Administered 2021-06-19 – 2021-06-21 (×9): 1 g via ORAL
  Filled 2021-06-19 (×11): qty 1

## 2021-06-19 SURGICAL SUPPLY — 15 items

## 2021-06-19 NOTE — Progress Notes (Signed)
PROGRESS NOTE    Wendy Hoffman  IRS:[id number] DOB: 1966-02-28 DOA: 06/17/2021 PCP: Patient, No Pcp Per (Inactive) (Confirm with patient/family/NH records and if not entered, this HAS to be entered at [facility] point of entry. "No PCP" if truly none.)   No chief complaint on file.   Brief Narrative:  HPI per Dr. Domingo Madeira is a 55 y.o. female with medical history significant of pancreatic CA, HTN, HLD. Presenting with intractable N/V. She reports that she's had several days of N/V. It's worse with eating. Both solids and liquids come back up within 30 minutes. He home nausea meds do not help. She went to clinic for her normal follow up and chemo session. She started vomiting and could not stop. There was no blood in her vomit. Labs were drawn. She was found to be in AKI. It was recommended that she come to the ED for help. She denies any other aggravating or alleviating factors.    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 -Patient with history of pancreatic cancer with partially obstructing infiltrative duodenal mass/duodenal adenocarcinoma per EGD March 2022. -Patient also noted with some oral thrush. -CT abdomen and pelvis negative for obstruction. -Urinalysis concerning for UTI with urine cultures with > 100,000 colonies of E. coli with sensitivities pending.   -Patient status post upper endoscopy with normal-appearing esophagus, severe gastritis in the  prepyloric area, nonobstructive mass in the second portion of the duodenum with biopsies pending.  -Diet has been advanced to full liquid diet per GI  -Continue IV PPI twice daily, Diflucan, nystatin swish and swallow -patient started on Carafate per GI.   -Continue IV fluids, supportive care.   -Place on IV Levaquin for E. coli UTI -GI following and appreciate their input and recommendations.   2.  Oral candidiasis -Per admitting physician patient had thrush 4 weeks and tried outpatient Magic mouthwash with no significant improvement. -Status post upper endoscopy this morning with no signs of esophageal candidiasis. -Continue IV Diflucan and when tolerating oral intake will transition to oral Diflucan. -Continue oral nystatin swish and swallow.  3.  Odynophagia/severe gastritis -Patient presented with some nausea vomiting.  Patient noted to have oral thrush on examination. -Patient also noted to have oral thrush 4 weeks.  Concern for possible esophageal candidiasis. -Patient seen in consultation by GI and patient underwent upper endoscopy today 06/19/2021 which showed normal-appearing esophagus, severe gastritis in the prepyloric area and small around 1 cm nonobstructive mass in the second portion of the duodenum with biopsies taken.   -Continue IV PPI twice daily x4 weeks, then PPI once daily.   -Patient started on Carafate for 4 weeks.  GI recommended advancing diet slowly.   -Continue IV Diflucan, nystatin swish and swallow.   -Appreciate GI input and recommendations.   4.  Hypokalemia/hypomagnesemia -Likely secondary to GI losses.   -Potassium currently at 3.7, phosphorus at 2.2, magnesium at 2.2.  -Repeat labs in the morning.  5.  Acute renal failure -Baseline creatinine of 0.71(05/26/2021). -Creatinine on admission was 3.25. -Likely secondary to a prerenal azotemia in the setting of nausea and vomiting. -Urinalysis with large leukocytes, nitrite negative, 30 protein. -Urine  cultures with > 100,000 colonies of E. coli, sensitivities pending.  -Urine sodium of 14, urine creatinine of 194.39. -Renal function improving with hydration creatinine currently at 1.65 from 2.28 from 3.25 on admission. -Urine output not properly recorded. - continue IV fluids. -Follow.  6.  Pancreatic adenocarcinoma/duodenal adenocarcinoma -Per oncology.  7.  Moderate protein calorie malnutrition -Likely secondary to poor oral intake with ongoing nausea and vomiting. -Patient has been seen by GI and patient underwent upper endoscopy today.   -Currently on a full liquid diet.   -Outpatient follow-up.  8.  E. coli UTI -Urine cultures with > 100,000 colonies of E. coli, sensitivities pending.   -IV Levaquin   DVT prophylaxis: SCDs Code Status: Full Family Communication: Updated patient.  No family at bedside. Disposition:   Status is: Inpatient  Remains inpatient appropriate because:IV treatments appropriate due to intensity of illness or inability to take PO  Dispo: The patient is from: Home              Anticipated d/c is to: Home              Patient currently is not medically stable to d/c.   Difficult to place patient No       Consultants:  Gastroenterology: Dr. Alessandra Bevels 06/18/2021 Oncology: Dr. Benay Spice  Procedures:  CT abdomen and pelvis 06/17/2021 Upper endoscopy: Dr. Alessandra Bevels 06/19/2021  Antimicrobials: IV Levaquin 06/19/2021>>>> IV Diflucan 06/17/2021>>>>   Subjective: Stated had a bout of nausea and emesis overnight, none this morning.  No chest pain.  Still with some complaints of abdominal discomfort.  Just returned from upper endoscopy.   Started on a full liquid diet post EGD.    Objective: Vitals:   06/19/21 0808 06/19/21 0810 06/19/21 0820 06/19/21 0827  BP: (!) 141/88 (!) 152/88 (!) 156/93 (!) 158/97  Pulse: 80 81 74 71  Resp: 13 (!) 7 10 12   Temp: 98.3 F (36.8 C)     TempSrc: Oral     SpO2: 100% 100% 99% 98%    Intake/Output Summary  (Last 24 hours) at 06/19/2021 1318 Last data filed at 06/19/2021 0755 Gross per 24 hour  Intake 3202.22 ml  Output 5 ml  Net 3197.22 ml    There were no vitals filed for this visit.  Examination:  General exam: NAD.  Oral thrush improved Respiratory system: CTA B.  No wheezes, no crackles, no rhonchi.  Normal respiratory effort.   Cardiovascular system: Regular rate rhythm no murmurs rubs or gallops.  No JVD.  No lower extremity edema. Gastrointestinal system: Abdomen soft, nondistended, some tenderness to palpation in the upper abdomen/epigastric region.  Positive bowel sounds.  No rebound.  No guarding. Central nervous system: Alert and oriented.  Moving extremities spontaneously.  No focal neurological deficits.   Extremities: Symmetric 5 x 5 power. Skin: No rashes, lesions or ulcers Psychiatry: Judgement and insight appear normal. Mood & affect appropriate.     Data Reviewed: I have personally reviewed following labs and imaging studies  CBC: Recent Labs  Lab 06/17/21 0820 06/18/21 0453 06/19/21 0526  WBC 6.2 4.9 5.2  NEUTROABS 3.6  --   --   HGB 13.2 10.8* 10.4*  HCT 40.4 34.1* 32.8*  MCV 88.8 91.9 92.7  PLT 220 171 136*  Basic Metabolic Panel: Recent Labs  Lab 06/17/21 0820 06/17/21 2200 06/18/21 0453 06/19/21 0526  NA 137 139 137 139  K 2.4* 2.0* 3.0* 3.7  CL 93* 106 97* 105  CO2 29 24 30 25   GLUCOSE 105* 66* 89 103*  BUN 39* 32* 36* 25*  CREATININE 3.25* 2.04* 2.28* 1.65*  CALCIUM 8.7* 6.7* 8.6* 8.5*  MG 1.9  --  1.8 2.2  PHOS  --  2.7 3.4 2.2*     GFR: Estimated Creatinine Clearance: 42.8 mL/min (A) (by C-G formula based on SCr of 1.65 mg/dL (H)).  Liver Function Tests: Recent Labs  Lab 06/17/21 0820 06/17/21 2200 06/18/21 0453 06/19/21 0526  AST 23  --  20  --   ALT 12  --  12  --   ALKPHOS 98  --  74  --   BILITOT 0.7  --  0.8  --   PROT 6.1*  --  5.7*  --   ALBUMIN 3.5 2.4* 3.0* 2.8*     CBG: Recent Labs  Lab 06/18/21 2009  06/19/21 0027 06/19/21 0419 06/19/21 0936 06/19/21 1155  GLUCAP 88 94 104* 102* 101*      Recent Results (from the past 240 hour(s))  SARS CORONAVIRUS 2 (TAT 6-24 HRS) Nasopharyngeal Nasopharyngeal Swab     Status: None   Collection Time: 06/17/21  4:20 PM   Specimen: Nasopharyngeal Swab  Result Value Ref Range Status   SARS Coronavirus 2 NEGATIVE NEGATIVE Final    Comment: (NOTE) SARS-CoV-2 target nucleic acids are NOT DETECTED.  The SARS-CoV-2 RNA is generally detectable in upper and lower respiratory specimens during the acute phase of infection. Negative results do not preclude SARS-CoV-2 infection, do not rule out co-infections with other pathogens, and should not be used as the sole basis for treatment or other patient management decisions. Negative results must be combined with clinical observations, patient history, and epidemiological information. The expected result is Negative.  Fact Sheet for Patients: SugarRoll.be  Fact Sheet for Healthcare Providers: https://www.woods-mathews.com/  This test is not yet approved or cleared by the [place] FDA and  has been authorized for detection and/or diagnosis of SARS-CoV-2 by FDA under an Emergency Use Authorization (EUA). This EUA will remain  in effect (meaning this test can be used) for the duration of the COVID-19 declaration under Se ction 564(b)(1) of the Act, 21 U.S.C. section 360bbb-3(b)(1), unless the authorization is terminated or revoked sooner.  Performed at [facility], [place] [address] [place], [place] [address]   Culture, Urine     Status: Abnormal (Preliminary result)   Collection Time: 06/18/21 10:00 AM   Specimen: Urine, Catheterized  Result Value Ref Range Status   Specimen Description   Final    URINE, CATHETERIZED Performed at [facility], [place] [address] [place], [place] [address]    Special Requests   Final     NONE Performed at [facility], [place] [address] [place], [place] [address]    Culture >=100,000 COLONIES/mL ESCHERICHIA COLI (A)  Final   Report Status PENDING  Incomplete          Radiology Studies: CT ABDOMEN PELVIS WO CONTRAST  Result Date: 06/17/2021 CLINICAL DATA:  Rule out bowel obstruction. History of pancreas cancer. EXAM: CT ABDOMEN AND PELVIS WITHOUT CONTRAST TECHNIQUE: Multidetector CT imaging of the abdomen and pelvis was performed following the standard protocol without IV contrast. COMPARISON:  05/23/2021 FINDINGS: Lower chest: No acute abnormality. Hepatobiliary: Within a  background of diffuse hepatic steatosis there are multiple liver metastases as noted previously: The dominant lesion within the lateral segment of left hepatic lobe measures 7.8 cm, image 26/2. Previously this measured the same. The dominant lesion within the right hepatic lobe measures 8.2 cm, image 25/2. Previously 8 cm. Status post cholecystectomy. No biliary dilatation. Pancreas: Head of pancreas mass is suboptimally evaluated due to lack of IV contrast material. Subjectively, this does not appear significantly changed in size when compared with the previous contrast enhanced MRI. Spleen: Normal in size without focal abnormality. Adrenals/Urinary Tract: Normal adrenal glands. No hydronephrosis identified bilaterally. Punctate stone noted within inferior pole of left kidney. Exophytic cyst is again noted arising off the posterior cortex of the interpolar right kidney measuring 1.3 cm. Urinary bladder appears collapsed. Stomach/Bowel: Stomach is nondistended. The appendix is visualized and appears normal. No pathologic dilatation of the large or small bowel loops. No significant bowel wall thickening or surrounding inflammatory fat stranding. Vascular/Lymphatic: Mild aortic atherosclerosis. No aneurysm. No abdominopelvic adenopathy identified. Reproductive: Status post hysterectomy. No adnexal  masses.  The Other: No ascites or focal fluid collections. No peritoneal nodularity identified at this time. Musculoskeletal: No acute or significant osseous findings. IMPRESSION: 1. No acute findings within the abdomen or pelvis. No evidence for bowel obstruction. 2. Multiple liver metastases.  Similar to previous exam. 3. Head of pancreas mass is suboptimally evaluated due to lack of IV contrast material. Subjectively, this does not appear significantly changed in size when compared with the previous contrast enhanced MRI. 4. Aortic atherosclerosis. Aortic Atherosclerosis (ICD10-I70.0). Electronically Signed   By: Kerby Moors M.D.   On: 06/17/2021 19:07        Scheduled Meds:  Chlorhexidine Gluconate Cloth  6 each Topical Daily   heparin lock flush  500 Units Intracatheter Q30 days   mometasone-formoterol  2 puff Inhalation BID   nystatin  5 mL Oral QID   pantoprazole (PROTONIX) IV  40 mg Intravenous Q12H   phosphorus  250 mg Oral BID   sodium chloride flush  10-40 mL Intracatheter Q12H   sucralfate  1 g Oral TID WC & HS   Continuous Infusions:  dextrose 5 % and 0.9% NaCl 125 mL/hr at 06/19/21 0412   fluconazole (DIFLUCAN) IV Stopped (06/18/21 1814)   levofloxacin (LEVAQUIN) IV 500 mg (06/19/21 1243)   promethazine (PHENERGAN) injection (IM or IVPB) Stopped (06/17/21 1715)     LOS: 2 days    Time spent: 40 minutes    Irine Seal, MD [facility]   To contact the attending provider between 7A-7P or the covering provider during after hours 7P-7A, please log into the web site [email or web address] and access using universal [place] password for that web site. If you do not have the password, please call the hospital operator.  06/19/2021, 1:18 PM

## 2021-06-19 NOTE — Progress Notes (Addendum)
HEMATOLOGY-ONCOLOGY PROGRESS NOTE  SUBJECTIVE: Wendy Hoffman just returned from EGD at time of visit.  She is still a little sleepy from the procedure.  Operative report reviewed which showed severe gastritis in the prepyloric area and a small nonobstructive mass in the second portion of the duodenum.  Biopsies were taken.  GI recommending twice daily PPI and Carafate for 4 weeks for treatment of gastritis and to slowly advance diet.  She continues to have mild nausea and vomiting.  She has no other complaints today.  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  The report date is March 23, 2021.   The Multi-Cancer Panel with pancreatitis genes and preliminary pancreatic cancer genes offered by [facility] includes sequencing and/or deletion duplication testing of the following 91 genes: AIP, ALK, APC, ATM, AXIN2,BAP1,  BARD1, BLM, BMPR1A, BRCA1, BRCA2, BRIP1, CASR, CDC73, CDH1, CDK4, CDKN1B, CDKN1C, CDKN2A (p14ARF), CDKN2A (p16INK4a), CEBPA, CFTR, CHEK2, CPA1, CTNNA1, CTRC, DICER1, DIS3L2, EGFR (c.2369C>T, p.Thr790Met variant only), EPCAM (Deletion/duplication testing only), FANCC, FH, FLCN, GATA2, GPC3, GREM1 (Promoter region deletion/duplication testing only), HOXB13 (c.251G>A, p.Gly84Glu), HRAS, KIT, MAX, MEN1, MET, MITF (c.952G>A, p.Glu318Lys variant only), MLH1, MSH2, MSH3, MSH6, MUTYH, NBN, NF1, NF2, NTHL1, PALB2, PALLD, PDGFRA, PHOX2B, PMS2, POLD1,  POLE, POT1, PRKAR1A, PRSS1, PTCH1, PTEN, RAD50, RAD51C, RAD51D, RB1, RECQL4, RET, RNF43, RUNX1, SDHAF2, SDHA (sequence changes only), SDHB, SDHC, SDHD, SMAD4, SMARCA4, SMARCB1, SMARCE1, SPINK1, STK11, SUFU, TERC, TERT, TMEM127, TP53, TSC1, TSC2, VHL, WRN and WT1.     PHYSICAL EXAMINATION:  Vitals:   06/19/21 0820 06/19/21 0827  BP: (!) 156/93 (!) 158/97  Pulse: 74 71  Resp: 10 12  Temp:    SpO2: 99% 98%   There were no vitals filed for this visit.  Intake/Output from previous day: 07/07 0701 - 07/08 0700 In: 3242.2 [P.O.:360; I.V.:2632.2; IV Piggyback:250] Out: -   GENERAL:alert, no distress and comfortable SKIN: skin color, texture, turgor are normal, no rashes or significant lesions EYES: normal, Conjunctiva are pink and non-injected, sclera clear OROPHARYNX: Thrush noted on tongue, improved LUNGS: clear to auscultation and percussion with normal breathing effort HEART: regular rate & rhythm and no murmurs and no lower extremity edema ABDOMEN: Positive bowel sounds, soft, tenderness with palpation NEURO: alert & oriented x 3 with fluent speech, no focal motor/sensory deficits  LABORATORY DATA:  I have reviewed the data as listed CMP Latest Ref Rng & Units 06/19/2021 06/18/2021 06/17/2021  Glucose 70 - 99 mg/dL 103(H) 89 66(L)  BUN 6 - 20 mg/dL 25(H) 36(H) 32(H)  Creatinine 0.44 - 1.00 mg/dL 1.65(H) 2.28(H) 2.04(H)  Sodium 135 - 145 mmol/L 139 137 139  Potassium 3.5 - 5.1 mmol/L 3.7 3.0(L) 2.0(LL)  Chloride 98 - 111 mmol/L 105 97(L) 106  CO2 22 - 32 mmol/L _0 Calcium 8.9 - 10.3 mg/dL 8.5(L) 8.6(L) 6.7(L)  Total Protein 6.5 - 8.1 g/dL - 5.7(L) -  Total Bilirubin 0.3 -  1.2 mg/dL - 0.8 -  Alkaline Phos 38 - 126 U/L - 74 -  AST 15 - 41 U/L - 20 -  ALT 0 - 44 U/L - 12 -    Lab Results  Component Value Date   WBC 5.2 06/19/2021   HGB 10.4 (L) 06/19/2021   HCT 32.8 (L) 06/19/2021   MCV 92.7 06/19/2021   PLT 136 (L) 06/19/2021   NEUTROABS 3.6 06/17/2021    CT  ABDOMEN PELVIS WO CONTRAST  Result Date: 06/17/2021 CLINICAL DATA:  Rule out bowel obstruction. History of pancreas cancer. EXAM: CT ABDOMEN AND PELVIS WITHOUT CONTRAST TECHNIQUE: Multidetector CT imaging of the abdomen and pelvis was performed following the standard protocol without IV contrast. COMPARISON:  05/23/2021 FINDINGS: Lower chest: No acute abnormality. Hepatobiliary: Within a background of diffuse hepatic steatosis there are multiple liver metastases as noted previously: The dominant lesion within the lateral segment of left hepatic lobe measures 7.8 cm, image 26/2. Previously this measured the same. The dominant lesion within the right hepatic lobe measures 8.2 cm, image 25/2. Previously 8 cm. Status post cholecystectomy. No biliary dilatation. Pancreas: Head of pancreas mass is suboptimally evaluated due to lack of IV contrast material. Subjectively, this does not appear significantly changed in size when compared with the previous contrast enhanced MRI. Spleen: Normal in size without focal abnormality. Adrenals/Urinary Tract: Normal adrenal glands. No hydronephrosis identified bilaterally. Punctate stone noted within inferior pole of left kidney. Exophytic cyst is again noted arising off the posterior cortex of the interpolar right kidney measuring 1.3 cm. Urinary bladder appears collapsed. Stomach/Bowel: Stomach is nondistended. The appendix is visualized and appears normal. No pathologic dilatation of the large or small bowel loops. No significant bowel wall thickening or surrounding inflammatory fat stranding. Vascular/Lymphatic: Mild aortic atherosclerosis. No aneurysm. No abdominopelvic adenopathy identified. Reproductive: Status post hysterectomy. No adnexal masses.  The Other: No ascites or focal fluid collections. No peritoneal nodularity identified at this time. Musculoskeletal: No acute or significant osseous findings. IMPRESSION: 1. No acute findings within the abdomen or pelvis. No  evidence for bowel obstruction. 2. Multiple liver metastases.  Similar to previous exam. 3. Head of pancreas mass is suboptimally evaluated due to lack of IV contrast material. Subjectively, this does not appear significantly changed in size when compared with the previous contrast enhanced MRI. 4. Aortic atherosclerosis. Aortic Atherosclerosis (ICD10-I70.0). Electronically Signed   By: Taylor  Stroud M.D.   On: 06/17/2021 19:07   MR Abdomen W Wo Contrast  Result Date: 05/23/2021 CLINICAL DATA:  Follow-up pancreatic cancer. EXAM: MRI ABDOMEN WITHOUT AND WITH CONTRAST TECHNIQUE: Multiplanar multisequence MR imaging of the abdomen was performed both before and after the administration of intravenous contrast. CONTRAST:  10mL GADAVIST GADOBUTROL 1 MMOL/ML IV SOLN COMPARISON:  Prior MRI examination 02/21/2021 FINDINGS: Lower chest: The lung bases are grossly clear. No obvious pulmonary lesions and no pleural or pericardial effusion. Hepatobiliary: Diffuse and marked low T2 and low T1 signal intensity throughout the liver suggesting transfusional hemosiderosis. Numerous hepatic metastatic lesions are again demonstrated. The largest left hepatic lobe lesion mainly involving segment 3 measures a maximum 7.6 x 6.0 cm on image 9 of series 12. This previously measured 10.1 x 8.6 cm. Right hepatic lobe lesion in segment 8 and segment 7 measures 7.6 x 3.7 cm on image 7 of series 12. This previously measured 9.2 x 5.4 cm. Segment 3 lesion measures a maximum of 3 cm on image number 17/12 and previously measured 4.1 cm. Numerous other smaller metastatic   lesions are noted in both lobes. No new or progressive findings are identified. Stable mild common bile duct dilatation measuring a maximum of 8 mm. Pancreas: The pancreatic head mass measures approximately 2.9 x 2.9 cm on image 25/7 this previously measured 4.2 x 3.9 cm. Stable dilatation of the main pancreatic duct and stable atrophy of the body and tail of the pancreas.  Spleen:  No splenic lesions. Adrenals/Urinary Tract: Adrenal glands and kidneys are unremarkable. Small renal cysts are noted. Stomach/Bowel: The stomach, duodenum, visualized small bowel and visualized colon are grossly normal. Vascular/Lymphatic: No mesenteric or retroperitoneal adenopathy. The aorta and branch vessels are patent. Other:  No ascites or abdominal wall hernia. Musculoskeletal: No significant bony findings. IMPRESSION: 1. Interval decrease in size of the pancreatic head mass. 2. Interval decrease in size of the hepatic metastatic lesions. 3. No new or progressive findings. Electronically Signed   By: Marijo Sanes M.D.   On: 05/23/2021 16:24   MR 3D Recon At Scanner  Result Date: 05/23/2021 CLINICAL DATA:  Follow-up pancreatic cancer. EXAM: MRI ABDOMEN WITHOUT AND WITH CONTRAST TECHNIQUE: Multiplanar multisequence MR imaging of the abdomen was performed both before and after the administration of intravenous contrast. CONTRAST:  18m GADAVIST GADOBUTROL 1 MMOL/ML IV SOLN COMPARISON:  Prior MRI examination 02/21/2021 FINDINGS: Lower chest: The lung bases are grossly clear. No obvious pulmonary lesions and no pleural or pericardial effusion. Hepatobiliary: Diffuse and marked low T2 and low T1 signal intensity throughout the liver suggesting transfusional hemosiderosis. Numerous hepatic metastatic lesions are again demonstrated. The largest left hepatic lobe lesion mainly involving segment 3 measures a maximum 7.6 x 6.0 cm on image 9 of series 12. This previously measured 10.1 x 8.6 cm. Right hepatic lobe lesion in segment 8 and segment 7 measures 7.6 x 3.7 cm on image 7 of series 12. This previously measured 9.2 x 5.4 cm. Segment 3 lesion measures a maximum of 3 cm on image number 17/12 and previously measured 4.1 cm. Numerous other smaller metastatic lesions are noted in both lobes. No new or progressive findings are identified. Stable mild common bile duct dilatation measuring a maximum of 8 mm.  Pancreas: The pancreatic head mass measures approximately 2.9 x 2.9 cm on image 25/7 this previously measured 4.2 x 3.9 cm. Stable dilatation of the main pancreatic duct and stable atrophy of the body and tail of the pancreas. Spleen:  No splenic lesions. Adrenals/Urinary Tract: Adrenal glands and kidneys are unremarkable. Small renal cysts are noted. Stomach/Bowel: The stomach, duodenum, visualized small bowel and visualized colon are grossly normal. Vascular/Lymphatic: No mesenteric or retroperitoneal adenopathy. The aorta and branch vessels are patent. Other:  No ascites or abdominal wall hernia. Musculoskeletal: No significant bony findings. IMPRESSION: 1. Interval decrease in size of the pancreatic head mass. 2. Interval decrease in size of the hepatic metastatic lesions. 3. No new or progressive findings. Electronically Signed   By: PMarijo SanesM.D.   On: 05/23/2021 16:24    ASSESSMENT AND PLAN: 1. Pancreas cancer -CT abdomen/pelvis without contrast 02/16/2021-multiple large hypoechoic masses in the patient's liver consistent metastatic disease, ill-defined masslike area in the pancreatic head measuring approximate 4.7 cm, possible filling defect in the right ventricle. -EGD 02/19/2021-large infiltrative mass with bleeding in the second portion of the duodenum, appears to be arising near the ampulla, partially obstructive with friable mucosa.  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 -Flexible sigmoidoscopy 02/19/2021-diverticulosis in the sigmoid colon, nonbleeding external and internal hemorrhoids -Cardiac CT 02/20/2021-mass in the mid  RV attached to the interventricular septum measuring 16 mm x 6 mm and concerning for metastatic tumor -MRI abdomen with/without contrast 02/21/2021-mass in the posterior pancreatic head measuring 4.3 x 3.8 cm with obstruction of the central pancreatic duct and diffuse dilatation up to 6 mm consistent with pancreatic adenocarcinoma, liver  lesions the largest mass of the central left lobe measuring 9.9 x 9.2 cm consistent with hepatic metastatic disease,  hepatomegaly. -Ultrasound-guided biopsy of a left liver lesion 02/24/2021-adenocarcinoma, morphologically similar to the duodenal biopsy; microsatellite stable, tumor mutation burden 1 -Negative genetic testing -Palliative radiation to the duodenal mass 02/25/2021-03/06/2021 -Cycle 1 FOLFOX 03/17/2021 -Cycle 2 FOLFOX 04/01/2021 -Cycle 3 FOLFIRINOX 04/14/2021 -Cycle 4 FOLFIRINOX 04/27/2021, Udenyca added -Cycle 5 FOLFIRINOX 05/12/2021, Udenyca -MRI abdomen 05/23/2021-decrease in size of pancreas mass and hepatic lesions, no progressive disease -Cycle 6 FOLFIRINOX 05/27/2021 2.  Iron deficiency anemia-improved -Feraheme 510 mg 02/20/2021 3.  Protein calorie malnutrition 4.  Possible right ventricular filling defect on noncontrast CT scan MRI cardiac morphology 02/21/2021-mass in the mid RV attached to the interventricular septum measures 16 mm x 6 mm.  Mass appears heterogeneous with area of enhancement on LGE imaging.  Mass is concerning for metastatic tumor.  Normal LV size and systolic function.  Normal RV size and systolic function. 5.  Hypertension 6.  History of CVA 7.  Hyperlipidemia 8.  Hypothyroidism 9.  Morbid obesity 10.  Asthma 11.  Migraines 12.  Pain secondary to #1 13.  Port-A-Cath placement 02/25/2021 14.  Hypokalemia 04/01/2021-started a potassium supplement 15.  Hospital admission 06/17/2021-intractable nausea and vomiting  Ms. Gotts appears unchanged.  EGD was performed earlier this morning which showed severe gastritis and a nonobstructive mass.  Biopsies were taken during the procedure.  She continues to have mild nausea and vomiting.  It was recommended for her to begin treatment on PPI twice daily with Carafate for at least 4 weeks.  She remains on as needed antiemetics.  She has oral candidiasis.  She remains on fluconazole.  Recommendations: 1.  Appreciate  consult from GI-continue treatment with PPI twice daily and Carafate. 2.  Advance diet as tolerated. 3.  Continue fluconazole and nystatin for oral candidiasis.  Recommend changing fluconazole to p.o. when nausea and vomiting improved. 4.  Continue as needed antiemetics and pain medication. 5.  Continue Dilaudid as needed for pain, resume outpatient oral narcotic regimen when taking p.o.  Please call medical oncology over the weekend for questions.  We will check on her again on Monday if she remains in the hospital.  We will arrange for outpatient follow-up at the cancer center once she is discharged from the hospital.   LOS: 2 days   Mikey Bussing, DNP, AGPCNP-BC, AOCNP 06/19/21 I saw Ms. Lawhorn this morning in the endoscopy suite.  She appeared stable.  The endoscopy findings are noted.  We will follow-up on pathology from the gastric and duodenal biopsies.  It is possible the intractable nausea and vomiting is related to severe gastritis versus tumor involving the stomach and duodenum.  I feel she should be monitored for persistent nausea and vomiting while initiating treatment for gastritis.  She can resume MS Contin and oxycodone when she is tolerating a diet.  I appreciate the care from Dr. Grandville Silos and Dr. Alessandra Bevels.  I was present for greater than 50% of today's visit.  I performed medical decision making.

## 2021-06-19 NOTE — Progress Notes (Signed)
Patient refuses blood sugars. Educated patient the importance of blood sugar checks especially with her being on a full liquid diet.

## 2021-06-19 NOTE — Evaluation (Signed)
Occupational Therapy Evaluation Patient Details Name: Wendy Hoffman MRN: [id number] DOB: April 08, 1966 Today's Date: 06/19/2021    History of Present Illness patient is a 55 year old female who presented to the hospital with nausea and vomitting. EGD on 7/8 showed normal appearing espohagus, severe gastritis in the prepyloric area and small non obstructive mass in the second portion of the duodenum with biopsy taken. PMH: pancreatic CA, HTN, HLD.   Clinical Impression   Patient is a 55 year old female who is currently at her functional baseline. No further acute care OT needs identified at this time. Patient reported having boyfriend and niece support around the clock at time of d/c. All education has been completed and the patient has no further questions. Patient recommended to get tub transfer bench and hand held shower head at home and was provided with education on various places to look for one. Patient verbalized understanding. No further OT needs at this time.     Follow Up Recommendations  No OT follow up    Equipment Recommendations  Tub/shower bench    Recommendations for Other Services       Precautions / Restrictions Precautions Precautions: Other (comment)      Mobility Bed Mobility Overal bed mobility: Modified Independent             General bed mobility comments: sitting EOB on arrival, reports mobilizing to/from bathroom    Transfers Overall transfer level: Needs assistance Equipment used: Quad cane Transfers: Sit to/from Stand Sit to Stand: Supervision;Min guard              Balance                                           ADL either performed or assessed with clinical judgement   ADL Overall ADL's : At baseline;Modified independent                                       General ADL Comments: patient was able to complete ADL tasks on this date. patient was educated on getting tub transfer bench for bathing  with hand held shower.patient was able to don/doff socks and move around in bed with MI.      Vision   Vision Assessment?: No apparent visual deficits     Perception     Praxis      Pertinent Vitals/Pain Pain Assessment: No/denies pain Faces Pain Scale: Hurts little more Pain Location: patient reported having had pain medication with no pain at this time for goin pain noted earlier in day. Pain Descriptors / Indicators: Sharp Pain Intervention(s): Repositioned;Monitored during session     Hand Dominance Right   Extremity/Trunk Assessment Upper Extremity Assessment Upper Extremity Assessment: Overall WFL for tasks assessed   Lower Extremity Assessment Lower Extremity Assessment: Defer to PT evaluation   Cervical / Trunk Assessment Cervical / Trunk Assessment: Normal   Communication Communication Communication: No difficulties   Cognition Arousal/Alertness: Awake/alert Behavior During Therapy: WFL for tasks assessed/performed Overall Cognitive Status: Within Functional Limits for tasks assessed                                     General Comments  Exercises     Shoulder Instructions      Home Living Family/patient expects to be discharged to:: Private residence Living Arrangements: Alone Available Help at Discharge: Family (patient reported having niece and boyfriend to assist.) Type of Home: House Home Access: Level entry     Home Layout: One level     Bathroom Shower/Tub: Teacher, early years/pre: Standard     Home Equipment: Radio producer - quad          Prior Functioning/Environment Level of Independence: Independent with assistive device(s)                 OT Problem List:        OT Treatment/Interventions:      OT Goals(Current goals can be found in the care plan section)    OT Frequency:     Barriers to D/C:            Co-evaluation              AM-PAC OT "6 Clicks" Daily Activity     Outcome  Measure Help from another person eating meals?: None Help from another person taking care of personal grooming?: None Help from another person toileting, which includes using toliet, bedpan, or urinal?: None Help from another person bathing (including washing, rinsing, drying)?: None Help from another person to put on and taking off regular upper body clothing?: None Help from another person to put on and taking off regular lower body clothing?: None 6 Click Score: 24   End of Session    Activity Tolerance: Patient tolerated treatment well Patient left: in bed;with call bell/phone within reach  OT Visit Diagnosis: Muscle weakness (generalized) (M62.81)                Time: 1350-1402 OT Time Calculation (min): 12 min Charges:  OT Evaluation $OT Eval Low Complexity: 1 Low  Jackelyn Poling OTR/L, MS Acute Rehabilitation Department Office# [phone] Pager# [place] 06/19/2021, 2:15 PM

## 2021-06-19 NOTE — Progress Notes (Signed)
PT Cancellation Note  Patient Details Name: Wendy Hoffman MRN: [id number] DOB: 09/14/1966   Cancelled Treatment:    Reason Eval/Treat Not Completed: Patient at procedure or test/unavailable   Casady Voshell,KATHrine E 06/19/2021, 8:34 AM Arlyce Dice, DPT Acute Rehabilitation Services Pager: [phone] Office: [phone]

## 2021-06-19 NOTE — Interval H&P Note (Signed)
History and Physical Interval Note:  06/19/2021 7:38 AM  Wendy Hoffman  has presented today for surgery, with the diagnosis of nausea, vomiting, duodenal mass.  The various methods of treatment have been discussed with the patient and family. After consideration of risks, benefits and other options for treatment, the patient has consented to  Procedure(s): ESOPHAGOGASTRODUODENOSCOPY (EGD) WITH PROPOFOL (N/A) as a surgical intervention.  The patient's history has been reviewed, patient examined, no change in status, stable for surgery.  I have reviewed the patient's chart and labs.  Questions were answered to the patient's satisfaction.     Sahej Schrieber

## 2021-06-19 NOTE — Transfer of Care (Signed)
Immediate Anesthesia Transfer of Care Note  Patient: Maura Braaten  Procedure(s) Performed: ESOPHAGOGASTRODUODENOSCOPY (EGD) WITH PROPOFOL BIOPSY   Patient Location: ENDO  Anesthesia Type:MAC  Level of Consciousness: awake and patient cooperative  Airway & Oxygen Therapy: Patient Spontanous Breathing  Post-op Assessment: Report given to RN and Post -op Vital signs reviewed and stable  Post vital signs: Reviewed and stable  Last Vitals:  Vitals Value Taken Time  BP    Temp    Pulse 80 06/19/21 0808  Resp 17 06/19/21 0808  SpO2 100 % 06/19/21 0808  Vitals shown include unvalidated device data.  Last Pain:  Vitals:   06/19/21 0710  TempSrc: Oral  PainSc:       Patients Stated Pain Goal: 0 (65/03/54 6568)  Complications: No notable events documented.

## 2021-06-19 NOTE — Progress Notes (Signed)
Unable to draw blood from port. RN attempted to reposition patient, flush several times, and reposition port. Lab contacted to draw labs on patient.

## 2021-06-19 NOTE — Evaluation (Signed)
Physical Therapy One Time Evaluation Patient Details Name: Wendy Hoffman MRN: [id number] DOB: 1966/01/14 Today's Date: 06/19/2021   History of Present Illness  55 y.o. female presenting with intractable N/V. EGD on 06/19/21 showed normal-appearing esophagus, severe gastritis in the prepyloric area and small, around 1 cm, nonobstructive mass in the second portion of the duodenum.  Biopsies taken.   Past medical history significant of pancreatic CA, HTN, HLD.  Clinical Impression  Patient evaluated by Physical Therapy with no further acute PT needs identified. All education has been completed and the patient has no further questions.  Pt with mildly antalgic gait as pt reports left groin pain, which is new today; adjusted quad cane and educated pt on appropriate use.  Pt encouraged to continue mobilizing during acute stay. Pt agreeable to no further PT needs at this time.  See below for any follow-up Physical Therapy or equipment needs. PT is signing off. Thank you for this referral.     Follow Up Recommendations No PT follow up    Equipment Recommendations  None recommended by PT    Recommendations for Other Services       Precautions / Restrictions Precautions Precautions: None      Mobility  Bed Mobility Overal bed mobility: Modified Independent             General bed mobility comments: sitting EOB on arrival, reports mobilizing to/from bathroom    Transfers Overall transfer level: Needs assistance Equipment used: Quad cane Transfers: Sit to/from Stand Sit to Stand: Supervision;Min guard            Ambulation/Gait Ambulation/Gait assistance: Min Gaffer (Feet): 160 Feet Assistive device: Quad cane Gait Pattern/deviations: Step-through pattern;Antalgic     General Gait Details: mildly antalgic as pt reports left groin pain, which is new today; adjusted quad cane and educated pt on appropriate use, no overt LOB or unsteadiness  observed  Stairs            Wheelchair Mobility    Modified Rankin (Stroke Patients Only)       Balance                                             Pertinent Vitals/Pain Pain Assessment: Faces Faces Pain Scale: Hurts little more Pain Location: left groin Pain Descriptors / Indicators: Sharp Pain Intervention(s): Repositioned;Monitored during session    Home Living Family/patient expects to be discharged to:: Private residence Living Arrangements: Alone     Home Access: Level entry     Home Layout: One level Home Equipment: Cane - quad      Prior Function Level of Independence: Independent with assistive device(s)               Hand Dominance        Extremity/Trunk Assessment        Lower Extremity Assessment Lower Extremity Assessment: Overall WFL for tasks assessed       Communication   Communication: No difficulties  Cognition Arousal/Alertness: Awake/alert Behavior During Therapy: WFL for tasks assessed/performed Overall Cognitive Status: Within Functional Limits for tasks assessed                                        General Comments      Exercises  Assessment/Plan    PT Assessment Patent does not need any further PT services  PT Problem List         PT Treatment Interventions      PT Goals (Current goals can be found in the Care Plan section)  Acute Rehab PT Goals PT Goal Formulation: All assessment and education complete, DC therapy    Frequency     Barriers to discharge        Co-evaluation               AM-PAC PT "6 Clicks" Mobility  Outcome Measure Help needed turning from your back to your side while in a flat bed without using bedrails?: None Help needed moving from lying on your back to sitting on the side of a flat bed without using bedrails?: None Help needed moving to and from a bed to a chair (including a wheelchair)?: None Help needed standing up from a  chair using your arms (e.g., wheelchair or bedside chair)?: None Help needed to walk in hospital room?: A Little Help needed climbing 3-5 steps with a railing? : A Little 6 Click Score: 22    End of Session   Activity Tolerance: Patient tolerated treatment well Patient left: in chair;with call bell/phone within reach Nurse Communication: Mobility status PT Visit Diagnosis: Difficulty in walking, not elsewhere classified (R26.2)    Time: 1140-1153 PT Time Calculation (min) (ACUTE ONLY): 13 min   Charges:   PT Evaluation $PT Eval Low Complexity: 1 Low        Kati PT, DPT Acute Rehabilitation Services Pager: [phone] Office: [phone]   York Ram E 06/19/2021, 12:12 PM

## 2021-06-19 NOTE — Brief Op Note (Signed)
06/17/2021 - 06/19/2021  8:07 AM  PATIENT:  Wendy Hoffman  55 y.o. female  PRE-OPERATIVE DIAGNOSIS:  nausea, vomiting, duodenal mass  POST-OPERATIVE DIAGNOSIS:  * No post-op diagnosis entered *  PROCEDURE:  Procedure(s): ESOPHAGOGASTRODUODENOSCOPY (EGD) WITH PROPOFOL (N/A) BIOPSY  SURGEON:  Surgeon(s) and Role:    * Elleanna Melling, MD - Primary   Findings ---------- -EGD showed normal-appearing esophagus, severe gastritis in the prepyloric area and small , around 1 cm , nonobstructive mass in the second portion of the duodenum.  Biopsies taken.  Recommendations ------------------------ -Recommend twice daily PPI and Carafate for 4 weeks for severe gastritis followed by PPI once a day -Follow biopsy results -Recommend advancing diet slowly -No further inpatient GI work-up planned. -GI will sign off.  Call us back if needed  Otis Brace MD, [place] 06/19/2021, 8:09 AM  Contact #  [phone]

## 2021-06-19 NOTE — Op Note (Signed)
[facility] Patient Name: Wendy Hoffman Procedure Date: 06/19/2021 MRN: [id number] Attending MD: Otis Brace , MD Date of Birth: 1966/07/13 CSN: [id number] Age: 55 Admit Type: Inpatient Procedure:                Upper GI endoscopy Indications:              Nausea with vomiting, Personal history of malignant                            neoplasm Providers:                Otis Brace, MD, Nelia Shi, RN, Cherylynn Ridges, [place] CRNA Referring MD:              Medicines:                Sedation Administered by an Anesthesia Professional Complications:            No immediate complications. Estimated Blood Loss:     Estimated blood loss was minimal. Procedure:                Pre-Anesthesia Assessment:                           - Prior to the procedure, a History and Physical                            was performed, and patient medications and                            allergies were reviewed. The patient's tolerance of                            previous anesthesia was also reviewed. The risks                            and benefits of the procedure and the sedation                            options and risks were discussed with the patient.                            All questions were answered, and informed consent                            was obtained. Prior Anticoagulants: The patient has                            taken no previous anticoagulant or antiplatelet                            agents. ASA Grade Assessment: III - A patient with  severe systemic disease. After reviewing the risks                            and benefits, the patient was deemed in                            satisfactory condition to undergo the procedure.                           After obtaining informed consent, the endoscope was                            passed under direct vision. Throughout the                             procedure, the patient's blood pressure, pulse, and                            oxygen saturations were monitored continuously. The                            GIF-H190 ([id number]) Olympus gastroscope was                            introduced through the mouth, and advanced to the                            second part of duodenum. The upper GI endoscopy was                            accomplished without difficulty. The patient                            tolerated the procedure well. Scope In: Scope Out: Findings:      The Z-line was regular and was found 36 cm from the incisors.      Normal mucosa was found in the entire esophagus.      Diffuse severe inflammation characterized by congestion (edema),       erosions, erythema, friability and granularity was found in the       prepyloric region of the stomach and at the pylorus. Biopsies were taken       with a cold forceps for histology.      The cardia and gastric fundus were normal on retroflexion.      A small infiltrative mass with no bleeding was found in the second       portion of the duodenum. Biopsies were taken with a cold forceps for       histology.      The exam of the duodenum was otherwise normal. Impression:               - Z-line regular, 36 cm from the incisors.                           - Normal mucosa was found in the entire esophagus.                           -  Gastritis. Biopsied.                           - Duodenal mass. Biopsied. Moderate Sedation:      Moderate (conscious) sedation was personally administered by an       anesthesia professional. The following parameters were monitored: oxygen       saturation, heart rate, blood pressure, and response to care. Recommendation:           - Return patient to hospital ward for ongoing care.                           - Full liquid diet.                           - Continue present medications. Procedure Code(s):        --- Professional ---                            [phone], Esophagogastroduodenoscopy, flexible,                            transoral; with biopsy, single or multiple Diagnosis Code(s):        --- Professional ---                           K29.70, Gastritis, unspecified, without bleeding                           K31.89, Other diseases of stomach and duodenum                           R11.2, Nausea with vomiting, unspecified                           Z85.9, Personal history of malignant neoplasm,                            unspecified CPT copyright 2019 American Medical Association. All rights reserved. The codes documented in this report are preliminary and upon coder review may  be revised to meet current compliance requirements. Otis Brace, MD Otis Brace, MD 06/19/2021 8:06:47 AM Number of Addenda: 0

## 2021-06-19 NOTE — Anesthesia Postprocedure Evaluation (Signed)
Anesthesia Post Note  Patient: Wendy Hoffman  Procedure(s) Performed: ESOPHAGOGASTRODUODENOSCOPY (EGD) WITH PROPOFOL BIOPSY     Patient location during evaluation: PACU Anesthesia Type: MAC Level of consciousness: awake and alert and oriented Pain management: pain level controlled Vital Signs Assessment: post-procedure vital signs reviewed and stable Respiratory status: spontaneous breathing, nonlabored ventilation and respiratory function stable Cardiovascular status: blood pressure returned to baseline Postop Assessment: no apparent nausea or vomiting Anesthetic complications: no   No notable events documented.  Last Vitals:  Vitals:   06/19/21 0820 06/19/21 0827  BP: (!) 156/93 (!) 158/97  Pulse: 74 71  Resp: 10 12  Temp:    SpO2: 99% 98%    Last Pain:  Vitals:   06/19/21 0827  TempSrc:   PainSc: 0-No pain                 Brennan Bailey

## 2021-06-20 DIAGNOSIS — C170 Malignant neoplasm of duodenum: Secondary | ICD-10-CM | POA: Diagnosis not present

## 2021-06-20 DIAGNOSIS — N179 Acute kidney failure, unspecified: Secondary | ICD-10-CM | POA: Diagnosis not present

## 2021-06-20 DIAGNOSIS — E876 Hypokalemia: Secondary | ICD-10-CM | POA: Diagnosis not present

## 2021-06-20 DIAGNOSIS — R112 Nausea with vomiting, unspecified: Secondary | ICD-10-CM | POA: Diagnosis not present

## 2021-06-20 LAB — RENAL FUNCTION PANEL
Albumin: 2.6 g/dL — ABNORMAL LOW (ref 3.5–5.0)
Anion gap: 8 (ref 5–15)
BUN: 16 mg/dL (ref 6–20)
CO2: 25 mmol/L (ref 22–32)
Calcium: 8.4 mg/dL — ABNORMAL LOW (ref 8.9–10.3)
Chloride: 107 mmol/L (ref 98–111)
Creatinine, Ser: 1.5 mg/dL — ABNORMAL HIGH (ref 0.44–1.00)
GFR, Estimated: 41 mL/min — ABNORMAL LOW (ref >60)
Glucose, Bld: 93 mg/dL (ref 70–99)
Phosphorus: 2.3 mg/dL — ABNORMAL LOW (ref 2.5–4.6)
Potassium: 3.6 mmol/L (ref 3.5–5.1)
Sodium: 140 mmol/L (ref 135–145)

## 2021-06-20 LAB — URINE CULTURE: Culture: >=100000 — AB

## 2021-06-20 LAB — CBC
HCT: 31.8 % — ABNORMAL LOW (ref 36.0–46.0)
Hemoglobin: 10.1 g/dL — ABNORMAL LOW (ref 12.0–15.0)
MCH: 29.4 pg (ref 26.0–34.0)
MCHC: 31.8 g/dL (ref 30.0–36.0)
MCV: 92.7 fL (ref 80.0–100.0)
Platelets: 128 10*3/uL — ABNORMAL LOW (ref 150–400)
RBC: 3.43 MIL/uL — ABNORMAL LOW (ref 3.87–5.11)
RDW: 16.1 % — ABNORMAL HIGH (ref 11.5–15.5)
WBC: 4.7 10*3/uL (ref 4.0–10.5)
nRBC: 0 % (ref 0.0–0.2)

## 2021-06-20 LAB — MAGNESIUM: Magnesium: 1.7 mg/dL (ref 1.7–2.4)

## 2021-06-20 MED ORDER — POTASSIUM CHLORIDE CRYS ER 20 MEQ PO TBCR
40.0000 meq | EXTENDED_RELEASE_TABLET | Freq: Once | ORAL | Status: AC
  Administered 2021-06-20: 40 meq via ORAL
  Filled 2021-06-20: qty 2

## 2021-06-20 MED ORDER — MAGNESIUM SULFATE 4 GM/100ML IV SOLN
4.0000 g | Freq: Once | INTRAVENOUS | Status: AC
  Administered 2021-06-20: 4 g via INTRAVENOUS
  Filled 2021-06-20: qty 100

## 2021-06-20 MED ORDER — SODIUM CHLORIDE 0.9 % IV SOLN
1.0000 g | INTRAVENOUS | Status: AC
  Administered 2021-06-21: 1 g via INTRAVENOUS
  Filled 2021-06-20: qty 1

## 2021-06-20 NOTE — Progress Notes (Signed)
Pt continues to refuse FSBS despite education and encouragement. All other needs met at this time.

## 2021-06-20 NOTE — Progress Notes (Signed)
PROGRESS NOTE    Wendy Hoffman  PRF:[id number] DOB: 16-Jul-1966 DOA: 06/17/2021 PCP: Patient, No Pcp Per (Inactive)    No chief complaint on file.   Brief Narrative:  HPI per Dr. Domingo Madeira is a 55 y.o. female with medical history significant of pancreatic CA, HTN, HLD. Presenting with intractable N/V. She reports that she's had several days of N/V. It's worse with eating. Both solids and liquids come back up within 30 minutes. He home nausea meds do not help. She went to clinic for her normal follow up and chemo session. She started vomiting and could not stop. There was no blood in her vomit. Labs were drawn. She was found to be in AKI. It was recommended that she come to the ED for help. She denies any other aggravating or alleviating factors.    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 -Patient with history of pancreatic cancer with partially obstructing infiltrative duodenal mass/duodenal adenocarcinoma per EGD March 2022. -Patient also noted with some oral thrush. -CT abdomen and pelvis negative for obstruction. -Urinalysis concerning for UTI with urine cultures with > 100,000 colonies of E. coli pansensitive.   -Patient status post upper endoscopy with normal-appearing esophagus, severe gastritis in the prepyloric area, nonobstructive mass in the second portion of the duodenum with biopsies pending.  -Diet has been advanced to full liquid diet per  GI which patient tolerated. -Advance to soft diet. -Continue IV PPI twice daily, Diflucan, nystatin swish and swallow, Carafate. -Continue IV fluids, supportive care. -DC IV Diflucan and placed on IV Rocephin for 1 more dose for E. coli UTI. -GI following and appreciate input and recommendations.  2.  Oral candidiasis -Per admitting physician patient had thrush for weeks and tried outpatient Magic mouthwash with no significant improvement. -Status post upper endoscopy with no signs of esophageal candidiasis. -Clinical improvement.   -Continue IV Diflucan and transition to oral Diflucan when tolerating oral intake.   -Continue nystatin swish and swallow.    3.  Odynophagia/severe gastritis -Patient presented with some nausea vomiting.  Patient noted to have oral thrush on examination. -Patient also noted to have oral thrush for weeks.  Concern for possible esophageal candidiasis. -Patient seen in consultation by GI and patient underwent upper endoscopy today 06/19/2021 which showed normal-appearing esophagus, severe gastritis in the prepyloric area and small around 1 cm nonobstructive mass in the second portion of the duodenum with biopsies taken.   -Continue IV PPI twice daily x4 weeks, then PPI once daily.  -Continue Carafate x4 weeks.   -Patient tolerating full liquids will advance to a soft diet.   -Continue IV Diflucan, nystatin swish and swallow.   -Appreciate GI input and recommendations.    4.  Hypokalemia/hypomagnesemia -Likely secondary to GI losses.   -Potassium currently at 3.6, magnesium at 1.7, phosphorus at 2.3 -magnesium sulfate 4 g IV x1 . -K-Phos to 50 mg p.o. twice daily x3 days.   -Repeat labs  in the morning.  5.  Acute renal failure -Baseline creatinine of 0.71(05/26/2021). -Creatinine on admission was 3.25. -Likely secondary to a prerenal azotemia in the setting of nausea and vomiting. -Urinalysis with large leukocytes, nitrite negative, 30 protein. -Urine cultures  with > 100,000 colonies of E. coli, sensitivities pending.  -Urine sodium of 14, urine creatinine of 194.39. -Renal function improving with hydration with creatinine currently at 1.50 from 1.65 from 2.28 from 3.25 on admission. -Urine output not properly recorded. - continue IV fluids. -Follow.  6.  Pancreatic adenocarcinoma/duodenal adenocarcinoma -Per oncology.  7.  Moderate protein calorie malnutrition -Likely secondary to poor oral intake with ongoing nausea and vomiting. -Patient has been seen by GI and patient underwent upper endoscopy.   -Tolerated full liquid diet however bout of nausea and episode of emesis this morning.   -Patient willing to trial of advancing diet to a soft diet.   -Outpatient follow-up.   8.  E. coli UTI -Urine cultures with > 100,000 colonies of E. coli, which is pansensitive.   -Levaquin D2/3.   -Discontinue Levaquin and give a dose of IV Rocephin tomorrow to complete a 3-day course of antibiotic treatment.    DVT prophylaxis: SCDs Code Status: Full Family Communication: Updated patient.  No family at bedside.  Disposition:   Status is: Inpatient  Remains inpatient appropriate because:IV treatments appropriate due to intensity of illness or inability to take PO  Dispo: The patient is from: Home              Anticipated d/c is to: Home              Patient currently is not medically stable to d/c.   Difficult to place patient No       Consultants:  Gastroenterology: Dr. Alessandra Bevels 06/18/2021 Oncology: Dr. Benay Spice  Procedures:  CT abdomen and pelvis 06/17/2021 Upper endoscopy: Dr. Alessandra Bevels 06/19/2021  Antimicrobials: IV Levaquin 06/19/2021>>>> 06/20/2021 IV Diflucan 06/17/2021>>>> IV Rocephin 06/21/2021   Subjective: Nausea, some  emesis this morning.  Overall tolerated full liquid yesterday.  Feeling better.  No chest pain.  No shortness of breath.  Upper abdominal pain improving.  Willing to try soft diet today.  Objective: Vitals:    06/19/21 0827 06/19/21 1340 06/19/21 2111 06/20/21 0539  BP: (!) 158/97 119/83 128/88 130/86  Pulse: 71 77 72 70  Resp: 12 16 17 15   Temp:  98 F (36.7 C) 98.6 F (37 C) 98.3 F (36.8 C)  TempSrc:  Oral Oral Oral  SpO2: 98% 100% 100% 99%    Intake/Output Summary (Last 24 hours) at 06/20/2021 1205 Last data filed at 06/20/2021 0322 Gross per 24 hour  Intake 1808.22 ml  Output --  Net 1808.22 ml    There were no vitals filed for this visit.  Examination:  General exam: NAD.  Thrush improved. Respiratory system: Clear to auscultation bilaterally.  No wheezes, no crackles, no rhonchi.  Normal respiratory effort.  Cardiovascular system: RRR no murmurs rubs or gallops.  No JVD.  No lower extremity edema.  Gastrointestinal system: Abdomen is soft, nontender, decreased tenderness to palpation in the upper abdominal/epigastric region.  Positive bowel sounds.  No rebound.  No guarding.  Central nervous system: Alert and oriented.  No focal neurological deficits.   Extremities: Symmetric 5 x 5 power. Skin: No rashes, lesions or ulcers Psychiatry: Judgement and insight appear normal. Mood & affect appropriate.     Data Reviewed: I have personally reviewed following labs and imaging studies  CBC:  Recent Labs  Lab 06/17/21 0820 06/18/21 0453 06/19/21 0526 06/20/21 0527  WBC 6.2 4.9 5.2 4.7  NEUTROABS 3.6  --   --   --   HGB 13.2 10.8* 10.4* 10.1*  HCT 40.4 34.1* 32.8* 31.8*  MCV 88.8 91.9 92.7 92.7  PLT 220 171 136* 128*     Basic Metabolic Panel: Recent Labs  Lab 06/17/21 0820 06/17/21 2200 06/18/21 0453 06/19/21 0526 06/20/21 0527  NA 137 139 137 139 140  K 2.4* 2.0* 3.0* 3.7 3.6  CL 93* 106 97* 105 107  CO2 29 24 30 25 25   GLUCOSE 105* 66* 89 103* 93  BUN 39* 32* 36* 25* 16  CREATININE 3.25* 2.04* 2.28* 1.65* 1.50*  CALCIUM 8.7* 6.7* 8.6* 8.5* 8.4*  MG 1.9  --  1.8 2.2 1.7  PHOS  --  2.7 3.4 2.2* 2.3*     GFR: Estimated Creatinine Clearance: 47 mL/min (A)  (by C-G formula based on SCr of 1.5 mg/dL (H)).  Liver Function Tests: Recent Labs  Lab 06/17/21 0820 06/17/21 2200 06/18/21 0453 06/19/21 0526 06/20/21 0527  AST 23  --  20  --   --   ALT 12  --  12  --   --   ALKPHOS 98  --  74  --   --   BILITOT 0.7  --  0.8  --   --   PROT 6.1*  --  5.7*  --   --   ALBUMIN 3.5 2.4* 3.0* 2.8* 2.6*     CBG: Recent Labs  Lab 06/18/21 2009 06/19/21 0027 06/19/21 0419 06/19/21 0936 06/19/21 1155  GLUCAP 88 94 104* 102* 101*      Recent Results (from the past 240 hour(s))  SARS CORONAVIRUS 2 (TAT 6-24 HRS) Nasopharyngeal Nasopharyngeal Swab     Status: None   Collection Time: 06/17/21  4:20 PM   Specimen: Nasopharyngeal Swab  Result Value Ref Range Status   SARS Coronavirus 2 NEGATIVE NEGATIVE Final    Comment: (NOTE) SARS-CoV-2 target nucleic acids are NOT DETECTED.  The SARS-CoV-2 RNA is generally detectable in upper and lower respiratory specimens during the acute phase of infection. Negative results do not preclude SARS-CoV-2 infection, do not rule out co-infections with other pathogens, and should not be used as the sole basis for treatment or other patient management decisions. Negative results must be combined with clinical observations, patient history, and epidemiological information. The expected result is Negative.  Fact Sheet for Patients: SugarRoll.be  Fact Sheet for Healthcare Providers: https://www.woods-mathews.com/  This test is not yet approved or cleared by the [place] FDA and  has been authorized for detection and/or diagnosis of SARS-CoV-2 by FDA under an Emergency Use Authorization (EUA). This EUA will remain  in effect (meaning this test can be used) for the duration of the COVID-19 declaration under Se ction 564(b)(1) of the Act, 21 U.S.C. section 360bbb-3(b)(1), unless the authorization is terminated or revoked sooner.  Performed at [facility], [place] [address], [place], [place] [address]   Culture, Urine     Status: Abnormal   Collection Time: 06/18/21 10:00 AM   Specimen: Urine, Catheterized  Result Value Ref Range Status   Specimen Description   Final    URINE, CATHETERIZED Performed at [facility] [address], [place], [place] [address]    Special Requests   Final    NONE Performed at [facility], [place] [address] [place],  [address]  Culture >=100,000 COLONIES/mL ESCHERICHIA COLI (A)  Final   Report Status 06/20/2021 FINAL  Final   Organism ID, Bacteria ESCHERICHIA COLI (A)  Final      Susceptibility   Escherichia coli - MIC*    AMPICILLIN 8 SENSITIVE Sensitive     CEFAZOLIN <=4 SENSITIVE Sensitive     CEFEPIME <=0.12 SENSITIVE Sensitive     CEFTRIAXONE <=0.25 SENSITIVE Sensitive     CIPROFLOXACIN <=0.25 SENSITIVE Sensitive     GENTAMICIN <=1 SENSITIVE Sensitive     IMIPENEM <=0.25 SENSITIVE Sensitive     NITROFURANTOIN <=16 SENSITIVE Sensitive     TRIMETH/SULFA <=20 SENSITIVE Sensitive     AMPICILLIN/SULBACTAM <=2 SENSITIVE Sensitive     PIP/TAZO <=4 SENSITIVE Sensitive     * >=100,000 COLONIES/mL ESCHERICHIA COLI          Radiology Studies: No results found.      Scheduled Meds:  Chlorhexidine Gluconate Cloth  6 each Topical Daily   heparin lock flush  500 Units Intracatheter Q30 days   mometasone-formoterol  2 puff Inhalation BID   nystatin  5 mL Oral QID   pantoprazole (PROTONIX) IV  40 mg Intravenous Q12H   phosphorus  250 mg Oral BID   sodium chloride flush  10-40 mL Intracatheter Q12H   sucralfate  1 g Oral TID WC & HS   Continuous Infusions:  dextrose 5 % and 0.9% NaCl 125 mL/hr at 06/20/21 0640   fluconazole (DIFLUCAN) IV Stopped (06/19/21 1739)   levofloxacin (LEVAQUIN) IV 500 mg (06/20/21 1118)   promethazine (PHENERGAN) injection (IM or IVPB) Stopped (06/17/21 1715)     LOS: 3 days    Time spent: 40 minutes    Irine Seal, MD [facility]   To contact the attending provider between 7A-7P or the covering provider during after hours 7P-7A, please log into the web site [email or web address] and access using universal [place] password for that web site. If you do not have the password, please call the hospital operator.  06/20/2021, 12:05 PM

## 2021-06-20 NOTE — Progress Notes (Signed)
Lab notified to collect labs for patient due to port not pulling back blood.

## 2021-06-21 DIAGNOSIS — E876 Hypokalemia: Secondary | ICD-10-CM | POA: Diagnosis not present

## 2021-06-21 DIAGNOSIS — C170 Malignant neoplasm of duodenum: Secondary | ICD-10-CM | POA: Diagnosis not present

## 2021-06-21 DIAGNOSIS — R112 Nausea with vomiting, unspecified: Secondary | ICD-10-CM | POA: Diagnosis not present

## 2021-06-21 DIAGNOSIS — N179 Acute kidney failure, unspecified: Secondary | ICD-10-CM | POA: Diagnosis not present

## 2021-06-21 LAB — CBC WITH DIFFERENTIAL/PLATELET
Abs Immature Granulocytes: 0.2 10*3/uL — ABNORMAL HIGH (ref 0.00–0.07)
Basophils Absolute: 0 10*3/uL (ref 0.0–0.1)
Basophils Relative: 0 %
Eosinophils Absolute: 0.1 10*3/uL (ref 0.0–0.5)
Eosinophils Relative: 1 %
HCT: 30.2 % — ABNORMAL LOW (ref 36.0–46.0)
Hemoglobin: 9.9 g/dL — ABNORMAL LOW (ref 12.0–15.0)
Immature Granulocytes: 4 %
Lymphocytes Relative: 18 %
Lymphs Abs: 0.9 10*3/uL (ref 0.7–4.0)
MCH: 29.7 pg (ref 26.0–34.0)
MCHC: 32.8 g/dL (ref 30.0–36.0)
MCV: 90.7 fL (ref 80.0–100.0)
Monocytes Absolute: 0.7 10*3/uL (ref 0.1–1.0)
Monocytes Relative: 14 %
Neutro Abs: 3 10*3/uL (ref 1.7–7.7)
Neutrophils Relative %: 63 %
Platelets: 126 10*3/uL — ABNORMAL LOW (ref 150–400)
RBC: 3.33 MIL/uL — ABNORMAL LOW (ref 3.87–5.11)
RDW: 16.3 % — ABNORMAL HIGH (ref 11.5–15.5)
WBC: 4.8 10*3/uL (ref 4.0–10.5)
nRBC: 0 % (ref 0.0–0.2)

## 2021-06-21 LAB — RENAL FUNCTION PANEL
Albumin: 2.5 g/dL — ABNORMAL LOW (ref 3.5–5.0)
Anion gap: 7 (ref 5–15)
BUN: 10 mg/dL (ref 6–20)
CO2: 26 mmol/L (ref 22–32)
Calcium: 8.4 mg/dL — ABNORMAL LOW (ref 8.9–10.3)
Chloride: 112 mmol/L — ABNORMAL HIGH (ref 98–111)
Creatinine, Ser: 1.26 mg/dL — ABNORMAL HIGH (ref 0.44–1.00)
GFR, Estimated: 51 mL/min — ABNORMAL LOW (ref >60)
Glucose, Bld: 97 mg/dL (ref 70–99)
Phosphorus: 2.7 mg/dL (ref 2.5–4.6)
Potassium: 3.4 mmol/L — ABNORMAL LOW (ref 3.5–5.1)
Sodium: 145 mmol/L (ref 135–145)

## 2021-06-21 LAB — MAGNESIUM: Magnesium: 2 mg/dL (ref 1.7–2.4)

## 2021-06-21 MED ORDER — POTASSIUM CHLORIDE 10 MEQ/100ML IV SOLN
10.0000 meq | INTRAVENOUS | Status: AC
  Administered 2021-06-21 (×4): 10 meq via INTRAVENOUS
  Filled 2021-06-21 (×4): qty 100

## 2021-06-21 MED ORDER — ONDANSETRON HCL 4 MG/2ML IJ SOLN
4.0000 mg | Freq: Three times a day (TID) | INTRAMUSCULAR | Status: DC
  Administered 2021-06-21 – 2021-06-22 (×3): 4 mg via INTRAVENOUS
  Filled 2021-06-21 (×3): qty 2

## 2021-06-21 NOTE — Progress Notes (Signed)
PROGRESS NOTE    Wendy Hoffman  ZOX:[id number] DOB: 23-May-1966 DOA: 06/17/2021 PCP: Patient, No Pcp Per (Inactive)    No chief complaint on file.   Brief Narrative:  HPI per Dr. Domingo Madeira is a 55 y.o. female with medical history significant of pancreatic CA, HTN, HLD. Presenting with intractable N/V. She reports that she's had several days of N/V. It's worse with eating. Both solids and liquids come back up within 30 minutes. He home nausea meds do not help. She went to clinic for her normal follow up and chemo session. She started vomiting and could not stop. There was no blood in her vomit. Labs were drawn. She was found to be in AKI. It was recommended that she come to the ED for help. She denies any other aggravating or alleviating factors.    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 -Patient with history of pancreatic cancer with partially obstructing infiltrative duodenal mass/duodenal adenocarcinoma per EGD March 2022. -Patient also noted with some oral thrush. -CT abdomen and pelvis negative for obstruction. -Urinalysis concerning for UTI with urine cultures with > 100,000 colonies of E. coli pansensitive.   -Patient status post upper endoscopy with normal-appearing esophagus, severe gastritis in the prepyloric area, nonobstructive mass in the second portion of the duodenum with biopsies pending.  -Patient tolerated full liquid diet and diet  advanced to a soft diet however patient with nausea and emesis overnight and this morning and as such we will downgrade diet back to clear liquids.   -Continue IV PPI twice daily, nystatin swish and swallow, Diflucan, Carafate.   -IV fluids, supportive care.   -Complete course of IV antibiotics today for E. coli UTI  -Placed on scheduled Zofran for the next 24 to 48 hours.   -IV antiemetics as needed.   -Supportive care.    2.  Oral candidiasis -Per admitting physician patient had thrush for weeks and tried outpatient Magic mouthwash with no significant improvement. -Status post upper endoscopy with no signs of esophageal candidiasis. -Clinical improvement.   -Continue IV Diflucan and transition to oral Diflucan when tolerating oral intake.   -Continue nystatin swish and swallow.  3.  Odynophagia/severe gastritis -Patient presented with some nausea vomiting.  Patient noted to have oral thrush on examination. -Patient also noted to have oral thrush for weeks.  Concern for possible esophageal candidiasis. -Patient seen in consultation by GI and patient underwent upper endoscopy today 06/19/2021 which showed normal-appearing esophagus, severe gastritis in the prepyloric area and small around 1 cm nonobstructive mass in the second portion of the duodenum with biopsies taken.   -Continue IV PPI twice daily x4 weeks, then PPI once daily.  -Continue Carafate x4 weeks.   -Patient has been advanced to a soft diet however had ongoing nausea and emesis last night and this morning and as such we will downgrade back to clear liquids  -Continue IV Diflucan, nystatin swish and swallow.   -  Appreciate GI input and recommendations.    4.  Hypokalemia/hypomagnesemia -Likely secondary to GI losses.   -Potassium currently at 3.4, magnesium at 2.0, phosphorus at 2.7.  -Patient with ongoing nausea and vomiting and as such we will place on IV potassium.   -Repeat labs in the morning.   5.  Acute renal  failure -Baseline creatinine of 0.71(05/26/2021). -Creatinine on admission was 3.25. -Likely secondary to a prerenal azotemia in the setting of nausea and vomiting. -Urinalysis with large leukocytes, nitrite negative, 30 protein. -Urine cultures with > 100,000 colonies of E. coli, sensitivities pending.  -Urine sodium of 14, urine creatinine of 194.39. -Renal function improving with hydration with creatinine currently at 1.26 from 1.50 from 1.65 from 2.28 from 3.25 on admission. -Urine output not properly recorded. -Continue IV fluids as patient with ongoing nausea and emesis. -Follow.  6.  Pancreatic adenocarcinoma/duodenal adenocarcinoma -Per oncology.  7.  Moderate protein calorie malnutrition -Likely secondary to poor oral intake with ongoing nausea and vomiting. -Patient has been seen by GI and patient underwent upper endoscopy.   -Diet advanced to a soft diet however patient with nausea and emesis overnight and this morning.   -Downgrade diet back to clear liquids.   -Reassess tomorrow.   -Supportive care.   -Outpatient follow-up.   8.  E. coli UTI -Urine cultures with > 100,000 colonies of E. coli, which is pansensitive.   -Status post 2 days Levaquin.  IV Rocephin x1 today to complete a 3-day course of antibiotic treatment.    DVT prophylaxis: SCDs Code Status: Full Family Communication: Updated patient.  No family at bedside.  Disposition:   Status is: Inpatient  Remains inpatient appropriate because:IV treatments appropriate due to intensity of illness or inability to take PO  Dispo: The patient is from: Home              Anticipated d/c is to: Home              Patient currently is not medically stable to d/c.   Difficult to place patient No       Consultants:  Gastroenterology: Dr. Alessandra Bevels 06/18/2021 Oncology: Dr. Benay Spice  Procedures:  CT abdomen and pelvis 06/17/2021 Upper endoscopy: Dr. Alessandra Bevels 06/19/2021  Antimicrobials: IV Levaquin 06/19/2021>>>>  06/20/2021 IV Diflucan 06/17/2021>>>> IV Rocephin 06/21/2021>>>> 06/22/2021   Subjective: Sitting up in bed.  States had nausea and emesis twice last night and twice this morning.  Complain of significant epigastric abdominal pain.  No chest pain.  No shortness of breath.  Unable to tolerate soft diet due to nausea and emesis yesterday.   Objective: Vitals:   06/20/21 0539 06/20/21 1307 06/20/21 2004 06/21/21 0515  BP: 130/86 (!) 143/98 129/83 132/84  Pulse: 70 64 66 80  Resp: 15 18 16 18   Temp: 98.3 F (36.8 C) 98.4 F (36.9 C) 98.7 F (37.1 C) 98.6 F (37 C)  TempSrc: Oral Oral Oral Oral  SpO2: 99% (!) 88% 98% 96%    Intake/Output Summary (Last 24 hours) at 06/21/2021 1156 Last data filed at 06/21/2021 1100 Gross per 24 hour  Intake --  Output 3 ml  Net -3 ml    There were no vitals filed for this visit.  Examination:  General exam: Thrush improving. Respiratory system: CTA B.  No wheezes, no crackles, no rhonchi.  Normal respiratory effort.  Speaking in full sentences.   Cardiovascular system: Regular rate and rhythm no murmurs rubs or gallops.  No JVD.  No lower extremity edema.  Gastrointestinal system: Abdomen is soft, significant tenderness to palpation epigastric region, nondistended, positive bowel sounds.  No rebound.  No guarding.  Central nervous system: Alert and oriented.  No focal neurological deficits.    Extremities: Symmetric 5 x 5 power. Skin: No rashes, lesions or ulcers Psychiatry: Judgement and insight appear normal. Mood & affect appropriate.     Data Reviewed: I have personally reviewed following labs and imaging studies  CBC: Recent Labs  Lab 06/17/21 0820 06/18/21 0453 06/19/21 0526 06/20/21 0527 06/21/21 0652  WBC 6.2 4.9 5.2 4.7 4.8  NEUTROABS 3.6  --   --   --  3.0  HGB 13.2 10.8* 10.4* 10.1* 9.9*  HCT 40.4 34.1* 32.8* 31.8* 30.2*  MCV 88.8 91.9 92.7 92.7 90.7  PLT 220 171 136* 128* 126*     Basic Metabolic Panel: Recent Labs   Lab 06/17/21 0820 06/17/21 2200 06/18/21 0453 06/19/21 0526 06/20/21 0527 06/21/21 0652  NA 137 139 137 139 140 145  K 2.4* 2.0* 3.0* 3.7 3.6 3.4*  CL 93* 106 97* 105 107 112*  CO2 29 24 30 25 25 26   GLUCOSE 105* 66* 89 103* 93 97  BUN 39* 32* 36* 25* 16 10  CREATININE 3.25* 2.04* 2.28* 1.65* 1.50* 1.26*  CALCIUM 8.7* 6.7* 8.6* 8.5* 8.4* 8.4*  MG 1.9  --  1.8 2.2 1.7 2.0  PHOS  --  2.7 3.4 2.2* 2.3* 2.7     GFR: Estimated Creatinine Clearance: 56 mL/min (A) (by C-G formula based on SCr of 1.26 mg/dL (H)).  Liver Function Tests: Recent Labs  Lab 06/17/21 0820 06/17/21 2200 06/18/21 0453 06/19/21 0526 06/20/21 0527 06/21/21 0652  AST 23  --  20  --   --   --   ALT 12  --  12  --   --   --   ALKPHOS 98  --  74  --   --   --   BILITOT 0.7  --  0.8  --   --   --   PROT 6.1*  --  5.7*  --   --   --   ALBUMIN 3.5 2.4* 3.0* 2.8* 2.6* 2.5*     CBG: Recent Labs  Lab 06/18/21 2009 06/19/21 0027 06/19/21 0419 06/19/21 0936 06/19/21 1155  GLUCAP 88 94 104* 102* 101*      Recent Results (from the past 240 hour(s))  SARS CORONAVIRUS 2 (TAT 6-24 HRS) Nasopharyngeal Nasopharyngeal Swab     Status: None   Collection Time: 06/17/21  4:20 PM   Specimen: Nasopharyngeal Swab  Result Value Ref Range Status   SARS Coronavirus 2 NEGATIVE NEGATIVE Final    Comment: (NOTE) SARS-CoV-2 target nucleic acids are NOT DETECTED.  The SARS-CoV-2 RNA is generally detectable in upper and lower respiratory specimens during the acute phase of infection. Negative results do not preclude SARS-CoV-2 infection, do not rule out co-infections with other pathogens, and should not be used as the sole basis for treatment or other patient management decisions. Negative results must be combined with clinical observations, patient history, and epidemiological information. The expected result is Negative.  Fact Sheet for Patients: SugarRoll.be  Fact Sheet for  Healthcare Providers: https://www.woods-mathews.com/  This test is not yet approved or cleared by the [place] FDA and  has been authorized for detection and/or diagnosis of SARS-CoV-2 by FDA under an Emergency Use Authorization (EUA). This EUA will remain  in effect (meaning this test can be used) for the duration of the  COVID-19 declaration under Se ction 564(b)(1) of the Act, 21 U.S.C. section 360bbb-3(b)(1), unless the authorization is terminated or revoked sooner.  Performed at [facility], [place] [address] [place], [place] [address]   Culture, Urine     Status: Abnormal   Collection Time: 06/18/21 10:00 AM   Specimen: Urine, Catheterized  Result Value Ref Range Status   Specimen Description   Final    URINE, CATHETERIZED Performed at [facility] [address] [place], [place] [address]    Special Requests   Final    NONE Performed at [facility], [place] [address], [place], [place] [address]    Culture >=100,000 COLONIES/mL ESCHERICHIA COLI (A)  Final   Report Status 06/20/2021 FINAL  Final   Organism ID, Bacteria ESCHERICHIA COLI (A)  Final      Susceptibility   Escherichia coli - MIC*    AMPICILLIN 8 SENSITIVE Sensitive     CEFAZOLIN <=4 SENSITIVE Sensitive     CEFEPIME <=0.12 SENSITIVE Sensitive     CEFTRIAXONE <=0.25 SENSITIVE Sensitive     CIPROFLOXACIN <=0.25 SENSITIVE Sensitive     GENTAMICIN <=1 SENSITIVE Sensitive     IMIPENEM <=0.25 SENSITIVE Sensitive     NITROFURANTOIN <=16 SENSITIVE Sensitive     TRIMETH/SULFA <=20 SENSITIVE Sensitive     AMPICILLIN/SULBACTAM <=2 SENSITIVE Sensitive     PIP/TAZO <=4 SENSITIVE Sensitive     * >=100,000 COLONIES/mL ESCHERICHIA COLI          Radiology Studies: No results found.      Scheduled Meds:  Chlorhexidine Gluconate Cloth  6 each Topical Daily   heparin lock flush  500 Units Intracatheter Q30 days   mometasone-formoterol  2 puff Inhalation  BID   nystatin  5 mL Oral QID   pantoprazole (PROTONIX) IV  40 mg Intravenous Q12H   phosphorus  250 mg Oral BID   sodium chloride flush  10-40 mL Intracatheter Q12H   sucralfate  1 g Oral TID WC & HS   Continuous Infusions:  cefTRIAXone (ROCEPHIN)  IV 1 g (06/21/21 1148)   dextrose 5 % and 0.9% NaCl 75 mL/hr at 06/21/21 0846   fluconazole (DIFLUCAN) IV 200 mg (06/20/21 1817)   potassium chloride 10 mEq (06/21/21 1104)   promethazine (PHENERGAN) injection (IM or IVPB) 12.5 mg (06/21/21 1107)     LOS: 4 days    Time spent: 35 minutes    Irine Seal, MD [facility]   To contact the attending provider between 7A-7P or the covering provider during after hours 7P-7A, please log into the web site [email or web address] and access using universal [place] password for that web site. If you do not have the password, please call the hospital operator.  06/21/2021, 11:56 AM

## 2021-06-22 DIAGNOSIS — N179 Acute kidney failure, unspecified: Secondary | ICD-10-CM | POA: Diagnosis not present

## 2021-06-22 DIAGNOSIS — C170 Malignant neoplasm of duodenum: Secondary | ICD-10-CM | POA: Diagnosis not present

## 2021-06-22 DIAGNOSIS — E876 Hypokalemia: Secondary | ICD-10-CM | POA: Diagnosis not present

## 2021-06-22 DIAGNOSIS — C250 Malignant neoplasm of head of pancreas: Secondary | ICD-10-CM | POA: Diagnosis not present

## 2021-06-22 DIAGNOSIS — R112 Nausea with vomiting, unspecified: Secondary | ICD-10-CM | POA: Diagnosis not present

## 2021-06-22 LAB — CBC WITH DIFFERENTIAL/PLATELET
Abs Immature Granulocytes: 0.22 10*3/uL — ABNORMAL HIGH (ref 0.00–0.07)
Basophils Absolute: 0 10*3/uL (ref 0.0–0.1)
Basophils Relative: 1 %
Eosinophils Absolute: 0.1 10*3/uL (ref 0.0–0.5)
Eosinophils Relative: 1 %
HCT: 31 % — ABNORMAL LOW (ref 36.0–46.0)
Hemoglobin: 9.8 g/dL — ABNORMAL LOW (ref 12.0–15.0)
Immature Granulocytes: 4 %
Lymphocytes Relative: 13 %
Lymphs Abs: 0.7 10*3/uL (ref 0.7–4.0)
MCH: 29.1 pg (ref 26.0–34.0)
MCHC: 31.6 g/dL (ref 30.0–36.0)
MCV: 92 fL (ref 80.0–100.0)
Monocytes Absolute: 0.7 10*3/uL (ref 0.1–1.0)
Monocytes Relative: 12 %
Neutro Abs: 3.8 10*3/uL (ref 1.7–7.7)
Neutrophils Relative %: 69 %
Platelets: 134 10*3/uL — ABNORMAL LOW (ref 150–400)
RBC: 3.37 MIL/uL — ABNORMAL LOW (ref 3.87–5.11)
RDW: 16.6 % — ABNORMAL HIGH (ref 11.5–15.5)
WBC: 5.5 10*3/uL (ref 4.0–10.5)
nRBC: 0 % (ref 0.0–0.2)

## 2021-06-22 LAB — RENAL FUNCTION PANEL
Albumin: 2.7 g/dL — ABNORMAL LOW (ref 3.5–5.0)
Anion gap: 8 (ref 5–15)
BUN: 7 mg/dL (ref 6–20)
CO2: 24 mmol/L (ref 22–32)
Calcium: 8.3 mg/dL — ABNORMAL LOW (ref 8.9–10.3)
Chloride: 110 mmol/L (ref 98–111)
Creatinine, Ser: 1.15 mg/dL — ABNORMAL HIGH (ref 0.44–1.00)
GFR, Estimated: 57 mL/min — ABNORMAL LOW (ref >60)
Glucose, Bld: 102 mg/dL — ABNORMAL HIGH (ref 70–99)
Phosphorus: 3.1 mg/dL (ref 2.5–4.6)
Potassium: 3.4 mmol/L — ABNORMAL LOW (ref 3.5–5.1)
Sodium: 142 mmol/L (ref 135–145)

## 2021-06-22 LAB — SURGICAL PATHOLOGY

## 2021-06-22 LAB — MAGNESIUM: Magnesium: 1.6 mg/dL — ABNORMAL LOW (ref 1.7–2.4)

## 2021-06-22 MED ORDER — PROCHLORPERAZINE EDISYLATE 10 MG/2ML IJ SOLN
10.0000 mg | Freq: Four times a day (QID) | INTRAMUSCULAR | Status: DC
  Administered 2021-06-22 – 2021-06-24 (×9): 10 mg via INTRAVENOUS
  Filled 2021-06-22 (×9): qty 2

## 2021-06-22 MED ORDER — MAGNESIUM SULFATE 4 GM/100ML IV SOLN
4.0000 g | Freq: Once | INTRAVENOUS | Status: AC
  Administered 2021-06-22: 4 g via INTRAVENOUS
  Filled 2021-06-22: qty 100

## 2021-06-22 MED ORDER — MORPHINE SULFATE ER 15 MG PO TBCR
30.0000 mg | EXTENDED_RELEASE_TABLET | Freq: Two times a day (BID) | ORAL | Status: DC
  Administered 2021-06-22 – 2021-06-30 (×16): 30 mg via ORAL
  Filled 2021-06-22 (×17): qty 2

## 2021-06-22 MED ORDER — ONDANSETRON HCL 4 MG/2ML IJ SOLN
4.0000 mg | Freq: Three times a day (TID) | INTRAMUSCULAR | Status: DC | PRN
  Administered 2021-06-22 – 2021-07-06 (×11): 4 mg via INTRAVENOUS
  Filled 2021-06-22 (×11): qty 2

## 2021-06-22 MED ORDER — POTASSIUM CHLORIDE 10 MEQ/100ML IV SOLN
10.0000 meq | INTRAVENOUS | Status: AC
  Administered 2021-06-22 (×4): 10 meq via INTRAVENOUS
  Filled 2021-06-22 (×4): qty 100

## 2021-06-22 MED ORDER — SUCRALFATE 1 GM/10ML PO SUSP
1.0000 g | Freq: Four times a day (QID) | ORAL | Status: DC
  Administered 2021-06-22 – 2021-07-07 (×51): 1 g via ORAL
  Filled 2021-06-22 (×52): qty 10

## 2021-06-22 MED ORDER — HYDROMORPHONE HCL 1 MG/ML IJ SOLN
1.0000 mg | INTRAMUSCULAR | Status: DC | PRN
Start: 2021-06-22 — End: 2021-07-07
  Administered 2021-06-22 – 2021-06-24 (×6): 1 mg via INTRAVENOUS
  Administered 2021-06-24 – 2021-06-25 (×5): 2 mg via INTRAVENOUS
  Administered 2021-06-26 (×3): 1 mg via INTRAVENOUS
  Administered 2021-06-26 – 2021-06-30 (×15): 2 mg via INTRAVENOUS
  Administered 2021-06-30: 1 mg via INTRAVENOUS
  Administered 2021-06-30 – 2021-07-03 (×14): 2 mg via INTRAVENOUS
  Administered 2021-07-03 – 2021-07-06 (×3): 1 mg via INTRAVENOUS
  Filled 2021-06-22: qty 1
  Filled 2021-06-22 (×2): qty 2
  Filled 2021-06-22: qty 1
  Filled 2021-06-22 (×5): qty 2
  Filled 2021-06-22: qty 1
  Filled 2021-06-22 (×3): qty 2
  Filled 2021-06-22: qty 1
  Filled 2021-06-22 (×12): qty 2
  Filled 2021-06-22: qty 1
  Filled 2021-06-22 (×2): qty 2
  Filled 2021-06-22: qty 1
  Filled 2021-06-22 (×2): qty 2
  Filled 2021-06-22: qty 1
  Filled 2021-06-22 (×11): qty 2
  Filled 2021-06-22: qty 1
  Filled 2021-06-22 (×2): qty 2
  Filled 2021-06-22: qty 1

## 2021-06-22 NOTE — Progress Notes (Signed)
PROGRESS NOTE    Wendy Hoffman  NLG:[id number] DOB: [birth year] DOA: 06/17/2021 PCP: Patient, No Pcp Per (Inactive)    No chief complaint on file.   Brief Narrative:  HPI per Dr. Domingo Madeira is a 55 y.o. female with medical history significant of pancreatic CA, HTN, HLD. Presenting with intractable N/V. She reports that she's had several days of N/V. It's worse with eating. Both solids and liquids come back up within 30 minutes. He home nausea meds do not help. She went to clinic for her normal follow up and chemo session. She started vomiting and could not stop. There was no blood in her vomit. Labs were drawn. She was found to be in AKI. It was recommended that she come to the ED for help. She denies any other aggravating or alleviating factors.    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 -Patient with history of pancreatic cancer with partially obstructing infiltrative duodenal mass/duodenal adenocarcinoma per EGD March 2022. -Patient also noted with some oral thrush. -CT abdomen and pelvis negative for obstruction. -Urinalysis concerning for UTI with urine cultures with > 100,000 colonies of E. coli pansensitive.   -Patient status post upper endoscopy with normal-appearing esophagus, severe gastritis in the prepyloric area, nonobstructive mass in the second portion of the duodenum with biopsies pending.  -Patient tolerated full liquid diet and diet  advanced to a soft diet however patient with nausea and emesis overnight and this morning and as such we will downgrade diet back to clear liquids.   -Continue IV PPI twice daily, nystatin swish and swallow, Diflucan, Carafate.   -IV fluids, supportive care.   -Completed course of IV antibiotics for E. coli UTI  -Due to ongoing nausea and vomiting we will discontinue scheduled Zofran and change to Compazine as needed.  -Supportive care.      2.  Oral candidiasis -Per admitting physician patient had thrush for weeks and tried outpatient Magic mouthwash with no significant improvement. -Status post upper endoscopy with no signs of esophageal candidiasis. -Continue IV Diflucan, when tolerating oral intake will transition to oral Diflucan. -Nystatin swish and swallow.  3.  Odynophagia/severe gastritis -Patient presented with some nausea vomiting.  Patient noted to have oral thrush on examination. -Patient also noted to have oral thrush for weeks. -Initial concern for Candida esophagitis however not noted on upper endoscopy. -Patient seen in consultation by GI and patient underwent upper endoscopy 06/19/2021 which showed normal-appearing esophagus, severe gastritis in the prepyloric area and small around 1 cm nonobstructive mass in the second portion of the duodenum with biopsies taken.   -Continue IV PPI twice daily x4 weeks, then PPI daily thereafter.  -Patient on Carafate tablets however as needed patient refusing due to nausea.   -Change Carafate to suspension.  -Patient has been advanced to a soft diet however had ongoing nausea and emesis and as such diet downgraded to clear liquids.  -Continue IV Diflucan, nystatin swish and  swallow.   -Appreciate GI input and recommendations.    4.  Hypokalemia/hypomagnesemia -Likely secondary to GI losses.   -Potassium at 3.4, magnesium at 1.6, phosphorus at 3.1.  -Patient with ongoing nausea and emesis.   -Magnesium sulfate 4 g IV x1, KDur 40 mEq p.o.  x1.  Potassium currently at 3.4, magnesium at 2.0, phosphorus at 2.7.   -Repeat labs in the morning.   5.  Acute renal failure -Baseline creatinine of 0.71(05/26/2021). -Creatinine on admission was 3.25. -Likely secondary to a prerenal azotemia in the setting of nausea and vomiting. -Urinalysis with large leukocytes, nitrite negative, 30 protein. -Urine cultures with > 100,000 colonies of E. coli, sensitivities pending.  -Urine sodium of 14, urine creatinine of 194.39. -Renal function improving and trending down with creatinine currently at 1.15 from 1.26 from 1.50 from 1.65 from 2.28 from 3.25 on admission. -UOP not properly recorded. -Continue IV fluids as patient with ongoing nausea and emesis. -Follow.  6.  Pancreatic adenocarcinoma/duodenal adenocarcinoma -Per oncology.  7.  Moderate protein calorie malnutrition -Likely secondary to poor oral intake with ongoing nausea and vomiting. -Patient has been seen by GI and patient underwent upper endoscopy.   -Diet advanced to a soft diet however patient with nausea and emesis and as such diet downgraded back to clear liquids.  -Continue PPI, Carafate.   -Supportive care.   -Outpatient follow-up.    8.  E. coli UTI -Urine cultures with > 100,000 colonies of E. coli, which is pansensitive.   -Status post 3 days IV antibiotics.   -No further antibiotics needed.    DVT prophylaxis: SCDs Code Status: Full Family Communication: Updated patient.  No family at bedside.  Disposition:   Status is: Inpatient  Remains inpatient appropriate because:IV treatments appropriate due to intensity of illness or inability to take PO  Dispo: The patient is from: Home              Anticipated d/c is to: Home              Patient currently is not medically stable to d/c.   Difficult to place patient No       Consultants:  Gastroenterology: Dr. Alessandra Bevels 06/18/2021 Oncology: Dr. Benay Spice  Procedures:  CT abdomen and pelvis 06/17/2021 Upper  endoscopy: Dr. Alessandra Bevels 06/19/2021  Antimicrobials: IV Levaquin 06/19/2021>>>> 06/20/2021 IV Diflucan 06/17/2021>>>> IV Rocephin 06/21/2021>>>> 06/22/2021   Subjective: Patient states that with ongoing nausea and emesis and significant epigastric abdominal pain.  States abdominal pain improves when she lays in the fetal position.  No chest pain.  Tolerating clears at this time.  Per RN refusing Carafate pills as she is unable to tolerate it as it makes her nauseous.  Objective: Vitals:   06/20/21 2004 06/21/21 0515 06/21/21 1509 06/22/21 0700  BP: 129/83 132/84 (!) 141/108 (!) 144/100  Pulse: 66 80 61 60  Resp: 16 18 15    Temp: 98.7 F (37.1 C) 98.6 F (37 C) 98.4 F (36.9 C) 98.7 F (37.1 C)  TempSrc: Oral Oral Oral Oral  SpO2: 98% 96% 100% 100%    Intake/Output Summary (Last 24 hours) at 06/22/2021 1145 Last data filed at 06/22/2021 0600 Gross per 24 hour  Intake 4534.11 ml  Output 2 ml  Net 4532.11 ml    There were no vitals filed for this visit.  Examination:  General exam: NAD.  Thrush improving. Respiratory system: Lungs clear to auscultation bilaterally.  No wheezes, no crackles, no rhonchi.  Normal respiratory effort.  Speaking in  full sentences.  Cardiovascular system: RRR no murmurs rubs or gallops.  No JVD.  No lower extremity edema.  Gastrointestinal system: Abdomen is soft, exquisite significant tenderness to palpation epigastric region, positive bowel sounds, nondistended.  No rebound.  No guarding. Central nervous system: Alert and oriented.  No focal neurological deficits.    Extremities: Symmetric 5 x 5 power. Skin: No rashes, lesions or ulcers Psychiatry: Judgement and insight appear normal. Mood & affect appropriate.     Data Reviewed: I have personally reviewed following labs and imaging studies  CBC: Recent Labs  Lab 06/17/21 0820 06/18/21 0453 06/19/21 0526 06/20/21 0527 06/21/21 0652 06/22/21 0458  WBC 6.2 4.9 5.2 4.7 4.8 5.5  NEUTROABS 3.6   --   --   --  3.0 3.8  HGB 13.2 10.8* 10.4* 10.1* 9.9* 9.8*  HCT 40.4 34.1* 32.8* 31.8* 30.2* 31.0*  MCV 88.8 91.9 92.7 92.7 90.7 92.0  PLT 220 171 136* 128* 126* 134*     Basic Metabolic Panel: Recent Labs  Lab 06/18/21 0453 06/19/21 0526 06/20/21 0527 06/21/21 0652 06/22/21 0458  NA 137 139 140 145 142  K 3.0* 3.7 3.6 3.4* 3.4*  CL 97* 105 107 112* 110  CO2 30 25 25 26 24   GLUCOSE 89 103* 93 97 102*  BUN 36* 25* 16 10 7   CREATININE 2.28* 1.65* 1.50* 1.26* 1.15*  CALCIUM 8.6* 8.5* 8.4* 8.4* 8.3*  MG 1.8 2.2 1.7 2.0 1.6*  PHOS 3.4 2.2* 2.3* 2.7 3.1     GFR: Estimated Creatinine Clearance: 61.4 mL/min (A) (by C-G formula based on SCr of 1.15 mg/dL (H)).  Liver Function Tests: Recent Labs  Lab 06/17/21 0820 06/17/21 2200 06/18/21 0453 06/19/21 0526 06/20/21 0527 06/21/21 0652 06/22/21 0458  AST 23  --  20  --   --   --   --   ALT 12  --  12  --   --   --   --   ALKPHOS 98  --  74  --   --   --   --   BILITOT 0.7  --  0.8  --   --   --   --   PROT 6.1*  --  5.7*  --   --   --   --   ALBUMIN 3.5   < > 3.0* 2.8* 2.6* 2.5* 2.7*   < > = values in this interval not displayed.     CBG: Recent Labs  Lab 06/18/21 2009 06/19/21 0027 06/19/21 0419 06/19/21 0936 06/19/21 1155  GLUCAP 88 94 104* 102* 101*      Recent Results (from the past 240 hour(s))  SARS CORONAVIRUS 2 (TAT 6-24 HRS) Nasopharyngeal Nasopharyngeal Swab     Status: None   Collection Time: 06/17/21  4:20 PM   Specimen: Nasopharyngeal Swab  Result Value Ref Range Status   SARS Coronavirus 2 NEGATIVE NEGATIVE Final    Comment: (NOTE) SARS-CoV-2 target nucleic acids are NOT DETECTED.  The SARS-CoV-2 RNA is generally detectable in upper and lower respiratory specimens during the acute phase of infection. Negative results do not preclude SARS-CoV-2 infection, do not rule out co-infections with other pathogens, and should not be used as the sole basis for treatment or other patient management  decisions. Negative results must be combined with clinical observations, patient history, and epidemiological information. The expected result is Negative.  Fact Sheet for Patients: SugarRoll.be  Fact Sheet for Healthcare Providers: https://www.woods-mathews.com/  This test is not yet approved  or cleared by the [place] and  has been authorized for detection and/or diagnosis of SARS-CoV-2 by FDA under an Emergency Use Authorization (EUA). This EUA will remain  in effect (meaning this test can be used) for the duration of the COVID-19 declaration under Se ction 564(b)(1) of the Act, 21 U.S.C. section 360bbb-3(b)(1), unless the authorization is terminated or revoked sooner.  Performed at [facility], [place] [address] [place], [place] [address]   Culture, Urine     Status: Abnormal   Collection Time: 06/18/21 10:00 AM   Specimen: Urine, Catheterized  Result Value Ref Range Status   Specimen Description   Final    URINE, CATHETERIZED Performed at [facility] [address], [place], [place] [address]    Special Requests   Final    NONE Performed at [facility], [place] [address], [place], [place] [address]    Culture >=100,000 COLONIES/mL ESCHERICHIA COLI (A)  Final   Report Status 06/20/2021 FINAL  Final   Organism ID, Bacteria ESCHERICHIA COLI (A)  Final      Susceptibility   Escherichia coli - MIC*    AMPICILLIN 8 SENSITIVE Sensitive     CEFAZOLIN <=4 SENSITIVE Sensitive     CEFEPIME <=0.12 SENSITIVE Sensitive     CEFTRIAXONE <=0.25 SENSITIVE Sensitive     CIPROFLOXACIN <=0.25 SENSITIVE Sensitive     GENTAMICIN <=1 SENSITIVE Sensitive     IMIPENEM <=0.25 SENSITIVE Sensitive     NITROFURANTOIN <=16 SENSITIVE Sensitive     TRIMETH/SULFA <=20 SENSITIVE Sensitive     AMPICILLIN/SULBACTAM <=2 SENSITIVE Sensitive     PIP/TAZO <=4 SENSITIVE Sensitive     * >=100,000 COLONIES/mL  ESCHERICHIA COLI          Radiology Studies: No results found.      Scheduled Meds:  Chlorhexidine Gluconate Cloth  6 each Topical Daily   heparin lock flush  500 Units Intracatheter Q30 days   mometasone-formoterol  2 puff Inhalation BID   nystatin  5 mL Oral QID   pantoprazole (PROTONIX) IV  40 mg Intravenous Q12H   prochlorperazine  10 mg Intravenous Q6H   sodium chloride flush  10-40 mL Intracatheter Q12H   sucralfate  1 g Oral TID WC & HS   Continuous Infusions:  dextrose 5 % and 0.9% NaCl 100 mL/hr at 06/21/21 1229   fluconazole (DIFLUCAN) IV 200 mg (06/21/21 1729)   potassium chloride 10 mEq (06/22/21 1142)   promethazine (PHENERGAN) injection (IM or IVPB) 12.5 mg (06/21/21 1107)     LOS: 5 days    Time spent: 35 minutes    Irine Seal, MD [facility]   To contact the attending provider between 7A-7P or the covering provider during after hours 7P-7A, please log into the web site [email or web address] and access using universal [place] password for that web site. If you do not have the password, please call the hospital operator.  06/22/2021, 11:45 AM

## 2021-06-22 NOTE — Progress Notes (Signed)
HEMATOLOGY-ONCOLOGY PROGRESS NOTE  SUBJECTIVE: Wendy Hoffman continues to have nausea and vomiting.  She reports after eating or drinking and at other times.  She also has pain in the upper abdomen, partially relieved with Dilaudid.  Oncology History  Malignant neoplasm of head of pancreas ([place])  02/17/2021 Initial Diagnosis   Malignant neoplasm of head of pancreas ([place])    02/25/2021 Cancer Staging   Staging form: Exocrine Pancreas, AJCC 8th Edition - Clinical: Stage IV (cT3, cN0, cM1) - Signed by Wendy Pier, MD on 02/25/2021  Total positive nodes: 0    03/17/2021 -  Chemotherapy    Patient is on Treatment Plan: PANCREAS MODIFIED FOLFIRINOX Q14D X 4 CYCLES       03/23/2021 Genetic Testing   Negative hereditary cancer genetic testing: no pathogenic variants detected in [facility] Multi-Cancer Panel + Pancreatitis Genes.  The report date is March 23, 2021.   The Multi-Cancer Panel with pancreatitis genes and preliminary pancreatic cancer genes offered by [facility] includes sequencing and/or deletion duplication testing of the following 91 genes: AIP, ALK, APC, ATM, AXIN2,BAP1,  BARD1, BLM, BMPR1A, BRCA1, BRCA2, BRIP1, CASR, CDC73, CDH1, CDK4, CDKN1B, CDKN1C, CDKN2A (p14ARF), CDKN2A (p16INK4a), CEBPA, CFTR, CHEK2, CPA1, CTNNA1, CTRC, DICER1, DIS3L2, EGFR (c.2369C>T, p.Thr790Met variant only), EPCAM (Deletion/duplication testing only), FANCC, FH, FLCN, GATA2, GPC3, GREM1 (Promoter region deletion/duplication testing only), HOXB13 (c.251G>A, p.Gly84Glu), HRAS, KIT, MAX, MEN1, MET, MITF (c.952G>A, p.Glu318Lys variant only), MLH1, MSH2, MSH3, MSH6, MUTYH, NBN, NF1, NF2, NTHL1, PALB2, PALLD, PDGFRA, PHOX2B, PMS2, POLD1, POLE, POT1, PRKAR1A, PRSS1, PTCH1, PTEN, RAD50, RAD51C, RAD51D, RB1, RECQL4, RET, RNF43, RUNX1, SDHAF2, SDHA (sequence changes only), SDHB, SDHC, SDHD, SMAD4, SMARCA4, SMARCB1, SMARCE1, SPINK1, STK11, SUFU, TERC, TERT, TMEM127, TP53, TSC1, TSC2, VHL, WRN and WT1.     PHYSICAL  EXAMINATION:  Vitals:   06/22/21 0700 06/22/21 1359  BP: (!) 144/100 (!) 144/92  Pulse: 60 61  Resp:  16  Temp: 98.7 F (37.1 C) 98.8 F (37.1 C)  SpO2: 100% 99%   Intake/Output from previous day: 07/10 0701 - 07/11 0700 In: 4534.1 [I.V.:4234.1; IV Piggyback:300] Out: 5 [Urine:4; Emesis/NG output:1]  GENERAL:alert, no distress and comfortable SKIN: skin color, texture, turgor are normal, no rashes or significant lesions EYES: normal, Conjunctiva are pink and non-injected, sclera clear OROPHARYNX: No thrush ABDOMEN: Tender throughout the upper abdomen, worse in the mid upper abdomen NEURO: alert & oriented x 3 with fluent speech, no focal motor/sensory deficits  LABORATORY DATA:  I have reviewed the data as listed CMP Latest Ref Rng & Units 06/22/2021 06/21/2021 06/20/2021  Glucose 70 - 99 mg/dL 102(H) 97 93  BUN 6 - 20 mg/dL '7 10 16  ' Creatinine 0.44 - 1.00 mg/dL 1.15(H) 1.26(H) 1.50(H)  Sodium 135 - 145 mmol/L 142 145 140  Potassium 3.5 - 5.1 mmol/L 3.4(L) 3.4(L) 3.6  Chloride 98 - 111 mmol/L 110 112(H) 107  CO2 22 - 32 mmol/L '24 26 25  ' Calcium 8.9 - 10.3 mg/dL 8.3(L) 8.4(L) 8.4(L)  Total Protein 6.5 - 8.1 g/dL - - -  Total Bilirubin 0.3 - 1.2 mg/dL - - -  Alkaline Phos 38 - 126 U/L - - -  AST 15 - 41 U/L - - -  ALT 0 - 44 U/L - - -    Lab Results  Component Value Date   WBC 5.5 06/22/2021   HGB 9.8 (L) 06/22/2021   HCT 31.0 (L) 06/22/2021   MCV 92.0 06/22/2021   PLT 134 (L) 06/22/2021   NEUTROABS 3.8 06/22/2021  CT ABDOMEN PELVIS WO CONTRAST  Result Date: 06/17/2021 CLINICAL DATA:  Rule out bowel obstruction. History of pancreas cancer. EXAM: CT ABDOMEN AND PELVIS WITHOUT CONTRAST TECHNIQUE: Multidetector CT imaging of the abdomen and pelvis was performed following the standard protocol without IV contrast. COMPARISON:  05/23/2021 FINDINGS: Lower chest: No acute abnormality. Hepatobiliary: Within a background of diffuse hepatic steatosis there are multiple liver  metastases as noted previously: The dominant lesion within the lateral segment of left hepatic lobe measures 7.8 cm, image 26/2. Previously this measured the same. The dominant lesion within the right hepatic lobe measures 8.2 cm, image 25/2. Previously 8 cm. Status post cholecystectomy. No biliary dilatation. Pancreas: Head of pancreas mass is suboptimally evaluated due to lack of IV contrast material. Subjectively, this does not appear significantly changed in size when compared with the previous contrast enhanced MRI. Spleen: Normal in size without focal abnormality. Adrenals/Urinary Tract: Normal adrenal glands. No hydronephrosis identified bilaterally. Punctate stone noted within inferior pole of left kidney. Exophytic cyst is again noted arising off the posterior cortex of the interpolar right kidney measuring 1.3 cm. Urinary bladder appears collapsed. Stomach/Bowel: Stomach is nondistended. The appendix is visualized and appears normal. No pathologic dilatation of the large or small bowel loops. No significant bowel wall thickening or surrounding inflammatory fat stranding. Vascular/Lymphatic: Mild aortic atherosclerosis. No aneurysm. No abdominopelvic adenopathy identified. Reproductive: Status post hysterectomy. No adnexal masses.  The Other: No ascites or focal fluid collections. No peritoneal nodularity identified at this time. Musculoskeletal: No acute or significant osseous findings. IMPRESSION: 1. No acute findings within the abdomen or pelvis. No evidence for bowel obstruction. 2. Multiple liver metastases.  Similar to previous exam. 3. Head of pancreas mass is suboptimally evaluated due to lack of IV contrast material. Subjectively, this does not appear significantly changed in size when compared with the previous contrast enhanced MRI. 4. Aortic atherosclerosis. Aortic Atherosclerosis (ICD10-I70.0). Electronically Signed   By: Kerby Moors M.D.   On: 06/17/2021 19:07    ASSESSMENT AND PLAN: 1.  Pancreas cancer -CT abdomen/pelvis without contrast 02/16/2021-multiple large hypoechoic masses in the patient's liver consistent metastatic disease, ill-defined masslike area in the pancreatic head measuring approximate 4.7 cm, possible filling defect in the right ventricle. -EGD 02/19/2021-large infiltrative mass with bleeding in the second portion of the duodenum, appears to be arising near the ampulla, partially obstructive with friable mucosa.  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 -Flexible sigmoidoscopy 02/19/2021-diverticulosis in the sigmoid colon, nonbleeding external and internal hemorrhoids -Cardiac CT 02/20/2021-mass in the mid RV attached to the interventricular septum measuring 16 mm x 6 mm and concerning for metastatic tumor -MRI abdomen with/without contrast 02/21/2021-mass in the posterior pancreatic head measuring 4.3 x 3.8 cm with obstruction of the central pancreatic duct and diffuse dilatation up to 6 mm consistent with pancreatic adenocarcinoma, liver lesions the largest mass of the central left lobe measuring 9.9 x 9.2 cm consistent with hepatic metastatic disease,  hepatomegaly. -Ultrasound-guided biopsy of a left liver lesion 02/24/2021-adenocarcinoma, morphologically similar to the duodenal biopsy; microsatellite stable, tumor mutation burden 1 -Negative genetic testing -Palliative radiation to the duodenal mass 02/25/2021-03/06/2021 -Cycle 1 FOLFOX 03/17/2021 -Cycle 2 FOLFOX 04/01/2021 -Cycle 3 FOLFIRINOX 04/14/2021 -Cycle 4 FOLFIRINOX 04/27/2021, Udenyca added -Cycle 5 FOLFIRINOX 05/12/2021, Udenyca -MRI abdomen 05/23/2021-decrease in size of pancreas mass and hepatic lesions, no progressive disease -Cycle 6 FOLFIRINOX 05/27/2021 2.  Iron deficiency anemia-improved -Feraheme 510 mg 02/20/2021 3.  Protein calorie malnutrition 4.  Possible right ventricular filling defect on noncontrast  CT scan MRI cardiac morphology 02/21/2021-mass in the mid RV attached  to the interventricular septum measures 16 mm x 6 mm.  Mass appears heterogeneous with area of enhancement on LGE imaging.  Mass is concerning for metastatic tumor.  Normal LV size and systolic function.  Normal RV size and systolic function. 5.  Hypertension 6.  History of CVA 7.  Hyperlipidemia 8.  Hypothyroidism 9.  Morbid obesity 10.  Asthma 11.  Migraines 12.  Pain secondary to #1 13.  Port-A-Cath placement 02/25/2021 14.  Hypokalemia 04/01/2021-started a potassium supplement 15.  Hospital admission 06/17/2021-intractable nausea and vomiting EGD 06/19/2021-diffuse gastritis, nonobstructing duodenal mass, biopsy of stomach-no malignancy or H. pylori, duodenal mass-adenocarcinoma  Ms. [place] continues to have intractable nausea and vomiting.  The etiology of her symptoms is unclear.  I suspect the nausea is related to the pancreas tumor with involvement of the duodenum. She has pain, also likely secondary to the pancreas/duodenal mass.  I discussed the case with Dr. [place] Silos.  The plan is to continue the current antacid regimen.  She will begin scheduled Compazine.  I will increase the Dilaudid dose and add MS Contin.  We will consider other antiemetic options including Zyprexa and Decadron if she is not able to tolerate a diet over the next few days.  Recommendations: 1.  Continue antacid regimen 2.  Antiemetics 3.  Resume MS Contin, increase Dilaudid   LOS: 5 days   Betsy Coder, MD 06/22/21

## 2021-06-23 ENCOUNTER — Other Ambulatory Visit: Payer: Medicaid Other

## 2021-06-23 ENCOUNTER — Ambulatory Visit: Payer: Medicaid Other | Admitting: Nurse Practitioner

## 2021-06-23 DIAGNOSIS — N179 Acute kidney failure, unspecified: Secondary | ICD-10-CM | POA: Diagnosis not present

## 2021-06-23 DIAGNOSIS — R112 Nausea with vomiting, unspecified: Secondary | ICD-10-CM | POA: Diagnosis not present

## 2021-06-23 DIAGNOSIS — E876 Hypokalemia: Secondary | ICD-10-CM | POA: Diagnosis not present

## 2021-06-23 DIAGNOSIS — C250 Malignant neoplasm of head of pancreas: Secondary | ICD-10-CM | POA: Diagnosis not present

## 2021-06-23 DIAGNOSIS — C170 Malignant neoplasm of duodenum: Secondary | ICD-10-CM | POA: Diagnosis not present

## 2021-06-23 LAB — RENAL FUNCTION PANEL
Albumin: 2.7 g/dL — ABNORMAL LOW (ref 3.5–5.0)
Anion gap: 7 (ref 5–15)
BUN: <5 mg/dL — ABNORMAL LOW (ref 6–20)
CO2: 24 mmol/L (ref 22–32)
Calcium: 8.2 mg/dL — ABNORMAL LOW (ref 8.9–10.3)
Chloride: 110 mmol/L (ref 98–111)
Creatinine, Ser: 0.97 mg/dL (ref 0.44–1.00)
GFR, Estimated: >60 mL/min (ref >60)
Glucose, Bld: 92 mg/dL (ref 70–99)
Phosphorus: 2.5 mg/dL (ref 2.5–4.6)
Potassium: 3.3 mmol/L — ABNORMAL LOW (ref 3.5–5.1)
Sodium: 141 mmol/L (ref 135–145)

## 2021-06-23 LAB — CBC
HCT: 29.3 % — ABNORMAL LOW (ref 36.0–46.0)
Hemoglobin: 9.5 g/dL — ABNORMAL LOW (ref 12.0–15.0)
MCH: 29.7 pg (ref 26.0–34.0)
MCHC: 32.4 g/dL (ref 30.0–36.0)
MCV: 91.6 fL (ref 80.0–100.0)
Platelets: 137 10*3/uL — ABNORMAL LOW (ref 150–400)
RBC: 3.2 MIL/uL — ABNORMAL LOW (ref 3.87–5.11)
RDW: 16.8 % — ABNORMAL HIGH (ref 11.5–15.5)
WBC: 4.6 10*3/uL (ref 4.0–10.5)
nRBC: 0 % (ref 0.0–0.2)

## 2021-06-23 LAB — GLUCOSE, CAPILLARY
Glucose-Capillary: 91 mg/dL (ref 70–99)
Glucose-Capillary: 125 mg/dL — ABNORMAL HIGH (ref 70–99)

## 2021-06-23 LAB — MAGNESIUM: Magnesium: 1.8 mg/dL (ref 1.7–2.4)

## 2021-06-23 MED ORDER — LORAZEPAM 2 MG/ML IJ SOLN: 0.5000 mg | Freq: Once | INTRAMUSCULAR | Status: AC

## 2021-06-23 MED ORDER — DEXAMETHASONE 4 MG PO TABS
4.0000 mg | ORAL_TABLET | Freq: Two times a day (BID) | ORAL | Status: DC
Start: 2021-06-23 — End: 2021-06-25
  Administered 2021-06-23 – 2021-06-24 (×4): 4 mg via ORAL
  Filled 2021-06-23 (×4): qty 1

## 2021-06-23 MED ORDER — POTASSIUM CHLORIDE 10 MEQ/100ML IV SOLN
10.0000 meq | INTRAVENOUS | Status: AC
  Administered 2021-06-23 (×4): 10 meq via INTRAVENOUS
  Filled 2021-06-23 (×4): qty 100

## 2021-06-23 MED ORDER — LORAZEPAM 2 MG/ML IJ SOLN
INTRAMUSCULAR | Status: AC
  Administered 2021-06-23: 0.5 mg
  Filled 2021-06-23: qty 1

## 2021-06-23 MED ORDER — MAGNESIUM SULFATE 2 GM/50ML IV SOLN
2.0000 g | Freq: Once | INTRAVENOUS | Status: AC
  Administered 2021-06-23: 2 g via INTRAVENOUS
  Filled 2021-06-23: qty 50

## 2021-06-23 NOTE — Progress Notes (Addendum)
HEMATOLOGY-ONCOLOGY PROGRESS NOTE  SUBJECTIVE: Wendy Hoffman continues to have nausea and vomiting.    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  The report date is March 23, 2021.   The Multi-Cancer Panel with pancreatitis genes and preliminary pancreatic cancer genes offered by [facility] includes sequencing and/or deletion duplication testing of the following 91 genes: AIP, ALK, APC, ATM, AXIN2,BAP1,  BARD1, BLM, BMPR1A, BRCA1, BRCA2, BRIP1, CASR, CDC73, CDH1, CDK4, CDKN1B, CDKN1C, CDKN2A (p14ARF), CDKN2A (p16INK4a), CEBPA, CFTR, CHEK2, CPA1, CTNNA1, CTRC, DICER1, DIS3L2, EGFR (c.2369C>T, p.Thr790Met variant only), EPCAM (Deletion/duplication testing only), FANCC, FH, FLCN, GATA2, GPC3, GREM1 (Promoter region deletion/duplication testing only), HOXB13 (c.251G>A, p.Gly84Glu), HRAS, KIT, MAX, MEN1, MET, MITF (c.952G>A, p.Glu318Lys variant only), MLH1, MSH2, MSH3, MSH6, MUTYH, NBN, NF1, NF2, NTHL1, PALB2, PALLD, PDGFRA, PHOX2B, PMS2, POLD1, POLE, POT1, PRKAR1A, PRSS1, PTCH1, PTEN, RAD50, RAD51C, RAD51D, RB1, RECQL4, RET, RNF43, RUNX1, SDHAF2, SDHA (sequence changes only), SDHB, SDHC, SDHD, SMAD4, SMARCA4, SMARCB1, SMARCE1, SPINK1, STK11, SUFU, TERC, TERT, TMEM127, TP53, TSC1, TSC2, VHL, WRN and WT1.     PHYSICAL EXAMINATION:  Vitals:   06/23/21 0459 06/23/21 0729  BP: (!) 147/82   Pulse: 70   Resp: 18   Temp: 98.5 F (36.9 C)   SpO2: 100%  100%   Intake/Output from previous day: 07/11 0701 - 07/12 0700 In: 1361.3 [P.O.:440; I.V.:564.5; IV Piggyback:356.8] Out: 50 [Emesis/NG output:50]  GENERAL:alert, no distress and comfortable SKIN: skin color, texture, turgor are normal, no rashes or significant lesions EYES: normal, Conjunctiva are pink and non-injected, sclera clear OROPHARYNX: No thrush ABDOMEN: Tender throughout the upper abdomen, worse in the mid upper abdomen NEURO: alert & oriented x 3 with fluent speech, no focal motor/sensory deficits  LABORATORY DATA:  I have reviewed the data as listed CMP Latest Ref Rng & Units 06/23/2021 06/22/2021 06/21/2021  Glucose 70 - 99 mg/dL 92 102(H) 97  BUN 6 - 20 mg/dL <5(L) 7 10  Creatinine 0.44 - 1.00 mg/dL 0.97 1.15(H) 1.26(H)  Sodium 135 - 145 mmol/L 141 142 145  Potassium 3.5 - 5.1 mmol/L 3.3(L) 3.4(L) 3.4(L)  Chloride 98 - 111 mmol/L 110 110 112(H)  CO2 22 - 32 mmol/L '24 24 26  ' Calcium 8.9 - 10.3 mg/dL 8.2(L) 8.3(L) 8.4(L)  Total Protein 6.5 - 8.1 g/dL - - -  Total Bilirubin 0.3 - 1.2 mg/dL - - -  Alkaline Phos 38 - 126 U/L - - -  AST 15 - 41 U/L - - -  ALT 0 - 44 U/L - - -    Lab Results  Component Value Date   WBC 4.6 06/23/2021   HGB 9.5 (L) 06/23/2021   HCT 29.3 (L) 06/23/2021   MCV 91.6 06/23/2021   PLT 137 (L) 06/23/2021   NEUTROABS 3.8 06/22/2021    CT ABDOMEN PELVIS WO CONTRAST  Result Date: 06/17/2021 CLINICAL DATA:  Rule out bowel obstruction. History of pancreas cancer. EXAM: CT ABDOMEN AND  PELVIS WITHOUT CONTRAST TECHNIQUE: Multidetector CT imaging of the abdomen and pelvis was performed following the standard protocol without IV contrast. COMPARISON:  05/23/2021 FINDINGS: Lower chest: No acute abnormality. Hepatobiliary: Within a background of diffuse hepatic steatosis there are multiple liver metastases as noted previously: The dominant lesion within the lateral segment of left hepatic lobe measures 7.8 cm, image 26/2. Previously this measured the  same. The dominant lesion within the right hepatic lobe measures 8.2 cm, image 25/2. Previously 8 cm. Status post cholecystectomy. No biliary dilatation. Pancreas: Head of pancreas mass is suboptimally evaluated due to lack of IV contrast material. Subjectively, this does not appear significantly changed in size when compared with the previous contrast enhanced MRI. Spleen: Normal in size without focal abnormality. Adrenals/Urinary Tract: Normal adrenal glands. No hydronephrosis identified bilaterally. Punctate stone noted within inferior pole of left kidney. Exophytic cyst is again noted arising off the posterior cortex of the interpolar right kidney measuring 1.3 cm. Urinary bladder appears collapsed. Stomach/Bowel: Stomach is nondistended. The appendix is visualized and appears normal. No pathologic dilatation of the large or small bowel loops. No significant bowel wall thickening or surrounding inflammatory fat stranding. Vascular/Lymphatic: Mild aortic atherosclerosis. No aneurysm. No abdominopelvic adenopathy identified. Reproductive: Status post hysterectomy. No adnexal masses.  The Other: No ascites or focal fluid collections. No peritoneal nodularity identified at this time. Musculoskeletal: No acute or significant osseous findings. IMPRESSION: 1. No acute findings within the abdomen or pelvis. No evidence for bowel obstruction. 2. Multiple liver metastases.  Similar to previous exam. 3. Head of pancreas mass is suboptimally evaluated due to lack of IV contrast material. Subjectively, this does not appear significantly changed in size when compared with the previous contrast enhanced MRI. 4. Aortic atherosclerosis. Aortic Atherosclerosis (ICD10-I70.0). Electronically Signed   By: Kerby Moors M.D.   On: 06/17/2021 19:07    ASSESSMENT AND PLAN: 1. Pancreas cancer -CT abdomen/pelvis without contrast 02/16/2021-multiple large hypoechoic masses in the patient's liver consistent metastatic disease,  ill-defined masslike area in the pancreatic head measuring approximate 4.7 cm, possible filling defect in the right ventricle. -EGD 02/19/2021-large infiltrative mass with bleeding in the second portion of the duodenum, appears to be arising near the ampulla, partially obstructive with friable mucosa.  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 -Flexible sigmoidoscopy 02/19/2021-diverticulosis in the sigmoid colon, nonbleeding external and internal hemorrhoids -Cardiac CT 02/20/2021-mass in the mid RV attached to the interventricular septum measuring 16 mm x 6 mm and concerning for metastatic tumor -MRI abdomen with/without contrast 02/21/2021-mass in the posterior pancreatic head measuring 4.3 x 3.8 cm with obstruction of the central pancreatic duct and diffuse dilatation up to 6 mm consistent with pancreatic adenocarcinoma, liver lesions the largest mass of the central left lobe measuring 9.9 x 9.2 cm consistent with hepatic metastatic disease,  hepatomegaly. -Ultrasound-guided biopsy of a left liver lesion 02/24/2021-adenocarcinoma, morphologically similar to the duodenal biopsy; microsatellite stable, tumor mutation burden 1 -Negative genetic testing -Palliative radiation to the duodenal mass 02/25/2021-03/06/2021 -Cycle 1 FOLFOX 03/17/2021 -Cycle 2 FOLFOX 04/01/2021 -Cycle 3 FOLFIRINOX 04/14/2021 -Cycle 4 FOLFIRINOX 04/27/2021, Udenyca added -Cycle 5 FOLFIRINOX 05/12/2021, Udenyca -MRI abdomen 05/23/2021-decrease in size of pancreas mass and hepatic lesions, no progressive disease -Cycle 6 FOLFIRINOX 05/27/2021 2.  Iron deficiency anemia-improved -Feraheme 510 mg 02/20/2021 3.  Protein calorie malnutrition 4.  Possible right ventricular filling defect on noncontrast CT scan MRI cardiac morphology 02/21/2021-mass in the mid RV attached to the interventricular septum measures 16 mm x 6 mm.  Mass appears  heterogeneous with area of enhancement on LGE imaging.  Mass is concerning for  metastatic tumor.  Normal LV size and systolic function.  Normal RV size and systolic function. 5.  Hypertension 6.  History of CVA 7.  Hyperlipidemia 8.  Hypothyroidism 9.  Morbid obesity 10.  Asthma 11.  Migraines 12.  Pain secondary to #1 13.  Port-A-Cath placement 02/25/2021 14.  Hypokalemia 04/01/2021-started a potassium supplement 15.  Hospital admission 06/17/2021-intractable nausea and vomiting EGD 06/19/2021-diffuse gastritis, nonobstructing duodenal mass, biopsy of stomach-no malignancy or H. pylori, duodenal mass-adenocarcinoma  Ms. Depaula continues to have intractable nausea and vomiting.  The etiology of her symptoms is unclear.  I suspect the nausea is related to the pancreas tumor with involvement of the duodenum. She has pain, also likely secondary to the pancreas/duodenal mass.  Will add on dexamethasone 4 mg twice daily as an antiemetic.  She will continue on her home dose of MS Contin and use Dilaudid as needed for pain.  If her symptoms do not improve, may need to consider placement of feeding tube and shift focus to comfort measures/hospice.  Recommendations: 1.  Continue antacid regimen 2.  Begin dexamethasone 4 mg p.o. twice daily 3.  Continue MS Contin and as needed Dilaudid  4.     LOS: 6 days   Mikey Bussing, MD 06/23/21 Ms. Shifrin was interviewed and examined.  She continues to have nausea/vomiting with and without meals.  I suspect the nausea is related to the pancreas tumor.  She will continue the current antacid therapy.  We will and Decadron.  She no longer has thrush.  I will discontinue Diflucan.  We will need to consider feeding tube placement if she is unable to maintain her nutrition/hydration by mouth.  I was present for greater than 50% of today's visit.  I performed medical decision making.  Julieanne Manson, MD

## 2021-06-23 NOTE — Progress Notes (Addendum)
PROGRESS NOTE    Wendy Hoffman  GBT:[id number] DOB: 04-Jan-1966 DOA: 06/17/2021 PCP: Patient, No Pcp Per (Inactive)    No chief complaint on file.   Brief Narrative:  HPI per Dr. Domingo Madeira is a 55 y.o. female with medical history significant of pancreatic CA, HTN, HLD. Presenting with intractable N/V. She reports that she's had several days of N/V. It's worse with eating. Both solids and liquids come back up within 30 minutes. He home nausea meds do not help. She went to clinic for her normal follow up and chemo session. She started vomiting and could not stop. There was no blood in her vomit. Labs were drawn. She was found to be in AKI. It was recommended that she come to the ED for help. She denies any other aggravating or alleviating factors.    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 -Ongoing nausea and emesis and abdominal pain likely secondary to pancreatic cancer. -Patient with history of pancreatic cancer with partially obstructing infiltrative duodenal mass/duodenal adenocarcinoma per EGD March 2022. -Patient also noted with some oral thrush. -CT abdomen and pelvis negative for obstruction. -Urinalysis concerning for UTI with urine cultures with > 100,000 colonies of E. coli pansensitive status post 3-day course of IV antibiotics.   -Patient status post upper endoscopy with normal-appearing esophagus, severe gastritis in the prepyloric area,  nonobstructive mass in the second portion of the duodenum with biopsies pending.  -Patient had been advanced to a soft diet however unable to tolerate due to ongoing nausea and emesis and as such diet downgraded back to clear liquids.  -Status post nystatin swish and swallow.   -Continue IV PPI, Carafate slurry's.   -IV fluids, supportive care.   -Continue scheduled Compazine, Zofran as needed.  Patient started on Decadron per oncology for nausea and emesis.   -We will give a one-time dose of IV Ativan for nausea and emesis.   -Supportive care.   -Oncology following.     2.  Oral candidiasis -Per admitting physician patient had thrush for weeks and tried outpatient Magic mouthwash with no significant improvement. -Status post upper endoscopy with no signs of esophageal candidiasis. -Clinical improvement. -Status post IV Diflucan. -Discontinue nystatin swish and swallow.  3.  Odynophagia/severe gastritis -Patient presented with some nausea vomiting.  Patient noted to have oral thrush on examination early on in the hospitalization. -Patient also noted to have oral thrush for weeks. -Initial concern for Candida esophagitis however not noted on upper endoscopy. -Patient seen in consultation by GI and patient underwent upper endoscopy 06/19/2021 which showed normal-appearing esophagus, severe gastritis in the prepyloric area and small around 1 cm nonobstructive mass in the second portion of the duodenum with biopsies taken.   -Continue IV PPI twice daily x4 weeks, then PPI daily thereafter.  -Patient unable to tolerate Carafate tablets and as such has been changed to Carafate slurry/suspension.   -Due to ongoing nausea and vomiting diet has  been downgraded to clear liquids.  -Status post IV Diflucan.  -Discontinue nystatin swish and swallow.   -GI was following but have signed off.    4.  Hypokalemia/hypomagnesemia -Secondary to GI losses.   -Potassium at 3.3, magnesium at 1.8, phosphorus at  2.5.   -KCl 10 mEq IV every hour x4 rounds.  -Magnesium sulfate 2 g IV x1. -Repeat labs in the morning.   5.  Acute renal failure -Baseline creatinine of 0.71(05/26/2021). -Creatinine on admission was 3.25. -Likely secondary to a prerenal azotemia in the setting of nausea and vomiting. -Urinalysis with large leukocytes, nitrite negative, 30 protein. -Urine cultures with > 100,000 colonies of E. coli, sensitivities pending.  -Urine sodium of 14, urine creatinine of 194.39. -Function improved with hydration with creatinine currently at 0.97 from 1.15 from 1.26 from 1.50 from 1.65 from 2.28 from 3.25 on admission. -Urine output not recorded. -Continue gentle hydration until able to tolerate oral intake or improvement with nausea and emesis.   -Follow.   6.  Pancreatic adenocarcinoma/duodenal adenocarcinoma -Per oncology.  7.  Moderate protein calorie malnutrition -Likely secondary to poor oral intake with ongoing nausea and vomiting. -Patient has been seen by GI and patient underwent upper endoscopy.   -Diet was advanced to a soft diet however patient with ongoing nausea and emesis in the/diet downgraded back to clear liquids.  -Continue PPI, Carafate.   -Supportive care.   8.  E. coli UTI -Urine cultures with > 100,000 colonies of E. coli, which is pansensitive.   -Status post 3 days IV antibiotics.   -No further antibiotics needed.    DVT prophylaxis: SCDs Code Status: Full Family Communication: Updated patient.  No family at bedside.  Disposition:   Status is: Inpatient  Remains inpatient appropriate because:IV treatments appropriate due to intensity of illness or inability to take PO  Dispo: The patient is from: Home              Anticipated d/c is to: Home              Patient currently is not medically stable to d/c.   Difficult to place patient No       Consultants:  Gastroenterology: Dr. Alessandra Bevels 06/18/2021 Oncology: Dr. Benay Spice  Procedures:  CT abdomen and  pelvis 06/17/2021 Upper endoscopy: Dr. Alessandra Bevels 06/19/2021  Antimicrobials: IV Levaquin 06/19/2021>>>> 06/20/2021 IV Diflucan 06/17/2021>>>> 06/23/2021 IV Rocephin 06/21/2021>>>> 06/22/2021   Subjective: Patient with ongoing nausea, emesis and upper abd pain.  Objective: Vitals:   06/22/21 1359 06/22/21 2047 06/23/21 0459 06/23/21 0729  BP: (!) 144/92 (!) 151/83 (!) 147/82   Pulse: 61 (!) 57 70   Resp: 16 20 18    Temp: 98.8 F (37.1 C) 98.5 F (36.9 C) 98.5 F (36.9 C)   TempSrc: Oral Oral Oral   SpO2: 99% 100% 100% 100%    Intake/Output Summary (Last 24 hours) at 06/23/2021 1131 Last data filed at 06/23/2021 0432 Gross per 24 hour  Intake 871.04 ml  Output 50 ml  Net 821.04 ml    There were no vitals filed for this visit.  Examination:  General exam: : NAD.  Thrush improved. Respiratory system: CTA B.  No wheezes, no rhonchi.  Speaking in full sentences.  Normal respiratory effort. Cardiovascular system: Regular rate and rhythm no murmurs rubs or gallops.  No JVD.  No lower extremity edema.  Gastrointestinal system: Abdomen soft, nondistended, exquisitely tender to palpation in the epigastric region.  Positive bowel sounds.  No rebound.  No guarding.  Central nervous system: Alert and oriented. No focal neurological deficits. Extremities: Symmetric 5 x 5 power. Skin: No rashes, lesions or ulcers Psychiatry: Judgement and insight appear normal. Mood & affect appropriate.  Data Reviewed: I have personally reviewed following labs and imaging studies  CBC: Recent Labs  Lab 06/17/21 0820 06/18/21 0453 06/19/21 0526 06/20/21 0527 06/21/21 0652 06/22/21 0458 06/23/21 0500  WBC 6.2   < > 5.2 4.7 4.8 5.5 4.6  NEUTROABS 3.6  --   --   --  3.0 3.8  --   HGB 13.2   < > 10.4* 10.1* 9.9* 9.8* 9.5*  HCT 40.4   < > 32.8* 31.8* 30.2* 31.0* 29.3*  MCV 88.8   < > 92.7 92.7 90.7 92.0 91.6  PLT 220   < > 136* 128* 126* 134* 137*   < > = values in this interval not displayed.      Basic Metabolic Panel: Recent Labs  Lab 06/19/21 0526 06/20/21 0527 06/21/21 0652 06/22/21 0458 06/23/21 0500  NA 139 140 145 142 141  K 3.7 3.6 3.4* 3.4* 3.3*  CL 105 107 112* 110 110  CO2 25 25 26 24 24   GLUCOSE 103* 93 97 102* 92  BUN 25* 16 10 7  <5*  CREATININE 1.65* 1.50* 1.26* 1.15* 0.97  CALCIUM 8.5* 8.4* 8.4* 8.3* 8.2*  MG 2.2 1.7 2.0 1.6* 1.8  PHOS 2.2* 2.3* 2.7 3.1 2.5     GFR: Estimated Creatinine Clearance: 72.7 mL/min (by C-G formula based on SCr of 0.97 mg/dL).  Liver Function Tests: Recent Labs  Lab 06/17/21 0820 06/17/21 2200 06/18/21 0453 06/19/21 0526 06/20/21 0527 06/21/21 0652 06/22/21 0458 06/23/21 0500  AST 23  --  20  --   --   --   --   --   ALT 12  --  12  --   --   --   --   --   ALKPHOS 98  --  74  --   --   --   --   --   BILITOT 0.7  --  0.8  --   --   --   --   --   PROT 6.1*  --  5.7*  --   --   --   --   --   ALBUMIN 3.5   < > 3.0* 2.8* 2.6* 2.5* 2.7* 2.7*   < > = values in this interval not displayed.     CBG: Recent Labs  Lab 06/19/21 0027 06/19/21 0419 06/19/21 0936 06/19/21 1155 06/23/21 0856  GLUCAP 94 104* 102* 101* 91      Recent Results (from the past 240 hour(s))  SARS CORONAVIRUS 2 (TAT 6-24 HRS) Nasopharyngeal Nasopharyngeal Swab     Status: None   Collection Time: 06/17/21  4:20 PM   Specimen: Nasopharyngeal Swab  Result Value Ref Range Status   SARS Coronavirus 2 NEGATIVE NEGATIVE Final    Comment: (NOTE) SARS-CoV-2 target nucleic acids are NOT DETECTED.  The SARS-CoV-2 RNA is generally detectable in upper and lower respiratory specimens during the acute phase of infection. Negative results do not preclude SARS-CoV-2 infection, do not rule out co-infections with other pathogens, and should not be used as the sole basis for treatment or other patient management decisions. Negative results must be combined with clinical observations, patient history, and epidemiological information. The  expected result is Negative.  Fact Sheet for Patients: SugarRoll.be  Fact Sheet for Healthcare Providers: https://www.woods-mathews.com/  This test is  not yet approved or cleared by the [place] and  has been authorized for detection and/or diagnosis of SARS-CoV-2 by FDA under an Emergency Use Authorization (EUA). This EUA will remain  in effect (meaning this test can be used) for the duration of the COVID-19 declaration under Se ction 564(b)(1) of the Act, 21 U.S.C. section 360bbb-3(b)(1), unless the authorization is terminated or revoked sooner.  Performed at [facility], [place] [address], [place], [place] [address]   Culture, Urine     Status: Abnormal   Collection Time: 06/18/21 10:00 AM   Specimen: Urine, Catheterized  Result Value Ref Range Status   Specimen Description   Final    URINE, CATHETERIZED Performed at [facility] [address], [place], [place] [address]    Special Requests   Final    NONE Performed at [facility], [place] [address] [place], [place] [address]    Culture >=100,000 COLONIES/mL ESCHERICHIA COLI (A)  Final   Report Status 06/20/2021 FINAL  Final   Organism ID, Bacteria ESCHERICHIA COLI (A)  Final      Susceptibility   Escherichia coli - MIC*    AMPICILLIN 8 SENSITIVE Sensitive     CEFAZOLIN <=4 SENSITIVE Sensitive     CEFEPIME <=0.12 SENSITIVE Sensitive     CEFTRIAXONE <=0.25 SENSITIVE Sensitive     CIPROFLOXACIN <=0.25 SENSITIVE Sensitive     GENTAMICIN <=1 SENSITIVE Sensitive     IMIPENEM <=0.25 SENSITIVE Sensitive     NITROFURANTOIN <=16 SENSITIVE Sensitive     TRIMETH/SULFA <=20 SENSITIVE Sensitive     AMPICILLIN/SULBACTAM <=2 SENSITIVE Sensitive     PIP/TAZO <=4 SENSITIVE Sensitive     * >=100,000 COLONIES/mL ESCHERICHIA COLI          Radiology Studies: No results found.      Scheduled Meds:  Chlorhexidine Gluconate Cloth  6  each Topical Daily   dexamethasone  4 mg Oral Q12H   heparin lock flush  500 Units Intracatheter Q30 days   LORazepam  0.5 mg Intravenous Once   mometasone-formoterol  2 puff Inhalation BID   morphine  30 mg Oral Q12H   nystatin  5 mL Oral QID   pantoprazole (PROTONIX) IV  40 mg Intravenous Q12H   prochlorperazine  10 mg Intravenous Q6H   sodium chloride flush  10-40 mL Intracatheter Q12H   sucralfate  1 g Oral Q6H   Continuous Infusions:  dextrose 5 % and 0.9% NaCl 100 mL/hr at 06/23/21 0839   fluconazole (DIFLUCAN) IV 200 mg (06/22/21 1708)   potassium chloride 10 mEq (06/23/21 1124)   promethazine (PHENERGAN) injection (IM or IVPB) Stopped (06/22/21 1203)     LOS: 6 days    Time spent: 35 minutes    Irine Seal, MD [facility]   To contact the attending provider between 7A-7P or the covering provider during after hours 7P-7A, please log into the web site [email or web address] and access using universal [place] password for that web site. If you do not have the password, please call the hospital operator.  06/23/2021, 11:31 AM

## 2021-06-24 DIAGNOSIS — R112 Nausea with vomiting, unspecified: Secondary | ICD-10-CM | POA: Diagnosis not present

## 2021-06-24 DIAGNOSIS — C250 Malignant neoplasm of head of pancreas: Secondary | ICD-10-CM | POA: Diagnosis not present

## 2021-06-24 LAB — RENAL FUNCTION PANEL
Albumin: 2.8 g/dL — ABNORMAL LOW (ref 3.5–5.0)
Anion gap: 7 (ref 5–15)
BUN: <5 mg/dL — ABNORMAL LOW (ref 6–20)
CO2: 23 mmol/L (ref 22–32)
Calcium: 8.4 mg/dL — ABNORMAL LOW (ref 8.9–10.3)
Chloride: 109 mmol/L (ref 98–111)
Creatinine, Ser: 0.94 mg/dL (ref 0.44–1.00)
GFR, Estimated: >60 mL/min (ref >60)
Glucose, Bld: 132 mg/dL — ABNORMAL HIGH (ref 70–99)
Phosphorus: 2.7 mg/dL (ref 2.5–4.6)
Potassium: 3.9 mmol/L (ref 3.5–5.1)
Sodium: 139 mmol/L (ref 135–145)

## 2021-06-24 LAB — CBC
HCT: 30 % — ABNORMAL LOW (ref 36.0–46.0)
Hemoglobin: 9.8 g/dL — ABNORMAL LOW (ref 12.0–15.0)
MCH: 29.5 pg (ref 26.0–34.0)
MCHC: 32.7 g/dL (ref 30.0–36.0)
MCV: 90.4 fL (ref 80.0–100.0)
Platelets: 150 10*3/uL (ref 150–400)
RBC: 3.32 MIL/uL — ABNORMAL LOW (ref 3.87–5.11)
RDW: 16.6 % — ABNORMAL HIGH (ref 11.5–15.5)
WBC: 4.8 10*3/uL (ref 4.0–10.5)
nRBC: 0 % (ref 0.0–0.2)

## 2021-06-24 LAB — MAGNESIUM: Magnesium: 1.6 mg/dL — ABNORMAL LOW (ref 1.7–2.4)

## 2021-06-24 LAB — GLUCOSE, CAPILLARY: Glucose-Capillary: 124 mg/dL — ABNORMAL HIGH (ref 70–99)

## 2021-06-24 MED ORDER — BOOST / RESOURCE BREEZE PO LIQD CUSTOM
1.0000 | Freq: Three times a day (TID) | ORAL | Status: DC
  Administered 2021-06-25 – 2021-07-07 (×12): 1 via ORAL

## 2021-06-24 MED ORDER — OXYCODONE HCL 5 MG/5ML PO SOLN
5.0000 mg | ORAL | Status: DC | PRN
  Administered 2021-06-26 – 2021-06-29 (×7): 10 mg via ORAL
  Filled 2021-06-24 (×7): qty 10

## 2021-06-24 MED ORDER — METOCLOPRAMIDE HCL 10 MG PO TABS: 10.0000 mg | ORAL_TABLET | Freq: Three times a day (TID) | ORAL | Status: DC

## 2021-06-24 NOTE — Progress Notes (Signed)
PROGRESS NOTE    Wendy Hoffman  WUX:[id number] DOB: November 03, 1966 DOA: 06/17/2021 PCP: Patient, No Pcp Per (Inactive)    Brief Narrative: 55 y.o. female with medical history significant of pancreatic CA, HTN, HLD. Presenting with intractable N/V. She reports that she's had several days of N/V. It's worse with eating. Both solids and liquids come back up within 30 minutes. He home nausea meds do not help. She went to clinic for her normal follow up and chemo session. She started vomiting and could not stop. There was no blood in her vomit. Labs were drawn. She was found to be in AKI. It was recommended that she come to the ED for help. She denies any other aggravating or alleviating factors.    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  She is on clear liquids.  This is thought to be secondary to pancreatic cancer with partially obstructing infiltrating duodenal mass/duodenal adenocarcinoma per EGD in March 2022. She has been treated for the oral thrush. CT abdomen and pelvis shows no evidence of bowel obstruction.  #2 e coli uti pansensitive sensitive finished 3 days of IV antibiotics.  #3 odynophagia due to gastritis status post EGD normal esophagus severe gastritis in the prepyloric area nonobstructive mass in the duodenum biopsies pending.  On clear liquid diet.  Continue IV PPI.  #4 AKI creatinine on admission was 3.25 improving with IV fluids.  #5 pancreatic adenocarcinoma/duodenal adenocarcinoma   #6  hypokalemia/hypomagnesemia replete and recheck labs.      Nutrition Problem: Moderate Malnutrition Etiology: chronic illness, cancer and cancer related treatments     Signs/Symptoms: percent weight loss, energy intake < or equal to 75% for > or equal to 1 month, mild muscle depletion    Interventions: Refer to RD note for recommendations  Estimated body mass index is 33.99 kg/m as calculated from the following:   Height as of an earlier encounter on 06/17/21: 5\' 4"  (1.626 m).   Weight as of an earlier encounter on 06/17/21: 89.8 kg.  DVT prophylaxis:scd Code Status:  Family Communication:  Disposition Plan:  Status is: Inpatient  Remains inpatient appropriate because:IV treatments appropriate due to intensity of illness or inability to take PO  Dispo: The patient is from: Home              Anticipated d/c is to: Home              Patient currently is not medically stable to d/c.   Difficult to place patient No    Consultants: GI and oncology  Procedures: Upper endoscopy 06/19/2021 Antimicrobials: Status post Levaquin and Rocephin and Diflucan Subjective: Patient resting in bed continues to have nausea and vomiting vomited this morning anxious to eat  Objective: Vitals:   06/23/21 0459 06/23/21 2148 06/24/21 0623 06/24/21 0700  BP: (!) 147/82 (!) 160/89 (!) 158/91   Pulse: 70 (!) 51 (!) 55   Resp: 18 18 14    Temp: 98.5 F (36.9 C) 98.3 F (36.8 C)  98.8 F (37.1 C)   TempSrc: Oral Oral Oral   SpO2:   100% 100%    Intake/Output Summary (Last 24 hours) at 06/24/2021 1246 Last data filed at 06/23/2021 1620 Gross per 24 hour  Intake 1202.75 ml  Output --  Net 1202.75 ml   There were no vitals filed for this visit.  Examination:  General exam: Appears calm and comfortable  Respiratory system: Clear to auscultation. Respiratory effort normal. Cardiovascular system: S1 & S2 heard, RRR. No JVD, murmurs, rubs, gallops or clicks. No pedal edema. Gastrointestinal  system: Abdomen is nondistended, soft and nontender. No organomegaly or masses felt. Normal bowel sounds heard. Central nervous system: Alert and oriented. No focal neurological deficits. Extremities: Symmetric 5 x 5 power. Skin: No rashes, lesions or ulcers Psychiatry: Judgement and insight appear normal. Mood & affect appropriate.     Data Reviewed: I have personally reviewed following labs and imaging studies  CBC: Recent Labs  Lab 06/20/21 0527 06/21/21 0652 06/22/21 0458 06/23/21 0500 06/24/21 0557  WBC 4.7 4.8 5.5 4.6 4.8  NEUTROABS  --  3.0 3.8  --   --   HGB 10.1* 9.9* 9.8* 9.5* 9.8*  HCT 31.8* 30.2* 31.0* 29.3* 30.0*  MCV 92.7 90.7 92.0 91.6 90.4  PLT 128* 126* 134* 137* 322   Basic Metabolic Panel: Recent Labs  Lab 06/20/21 0527 06/21/21 0652 06/22/21 0458 06/23/21 0500 06/24/21 0557  NA 140 145 142 141 139  K 3.6 3.4* 3.4* 3.3* 3.9  CL 107 112* 110 110 109  CO2 25 26 24 24 23   GLUCOSE 93 97 102* 92 132*  BUN 16 10 7  <5* <5*  CREATININE 1.50* 1.26* 1.15* 0.97 0.94  CALCIUM 8.4* 8.4* 8.3* 8.2* 8.4*  MG 1.7 2.0 1.6* 1.8 1.6*  PHOS 2.3* 2.7 3.1 2.5 2.7   GFR: Estimated Creatinine Clearance: 75.1 mL/min (by C-G formula based on SCr of 0.94 mg/dL). Liver Function Tests: Recent Labs  Lab 06/18/21 0453 06/19/21 0526 06/20/21 0527 06/21/21 0652 06/22/21 0458 06/23/21 0500 06/24/21 0557  AST 20  --   --   --   --   --   --   ALT 12  --   --   --   --   --   --   ALKPHOS 74  --   --   --   --   --   --   BILITOT 0.8  --   --   --   --   --   --   PROT 5.7*  --   --   --   --   --   --   ALBUMIN 3.0*   < > 2.6* 2.5* 2.7* 2.7* 2.8*   < > = values in this interval not displayed.   No results for input(s): LIPASE, AMYLASE in the last 168 hours. No results for input(s): AMMONIA in the last 168 hours. Coagulation Profile: No results for input(s): INR, PROTIME in the last 168 hours. Cardiac Enzymes: No results for input(s): CKTOTAL, CKMB, CKMBINDEX,  TROPONINI in the last 168 hours. BNP (last 3 results) No results for input(s): PROBNP in the last 8760 hours. HbA1C: No results for input(s): HGBA1C in the last 72 hours. CBG: Recent Labs  Lab 06/19/21 0936 06/19/21 1155 06/23/21 0856 06/23/21 2151 06/24/21 0021  GLUCAP 102* 101* 91 125* 124*   Lipid Profile: No results for input(s): CHOL, HDL, LDLCALC, TRIG, CHOLHDL, LDLDIRECT in the last 72 hours. Thyroid Function Tests: No  results for input(s): TSH, T4TOTAL, FREET4, T3FREE, THYROIDAB in the last 72 hours. Anemia Panel: No results for input(s): VITAMINB12, FOLATE, FERRITIN, TIBC, IRON, RETICCTPCT in the last 72 hours. Sepsis Labs: No results for input(s): PROCALCITON, LATICACIDVEN in the last 168 hours.  Recent Results (from the past 240 hour(s))  SARS CORONAVIRUS 2 (TAT 6-24 HRS) Nasopharyngeal Nasopharyngeal Swab     Status: None   Collection Time: 06/17/21  4:20 PM   Specimen: Nasopharyngeal Swab  Result Value Ref Range Status   SARS Coronavirus 2 NEGATIVE NEGATIVE Final    Comment: (NOTE) SARS-CoV-2 target nucleic acids are NOT DETECTED.  The SARS-CoV-2 RNA is generally detectable in upper and lower respiratory specimens during the acute phase of infection. Negative results do not preclude SARS-CoV-2 infection, do not rule out co-infections with other pathogens, and should not be used as the sole basis for treatment or other patient management decisions. Negative results must be combined with clinical observations, patient history, and epidemiological information. The expected result is Negative.  Fact Sheet for Patients: SugarRoll.be  Fact Sheet for Healthcare Providers: https://www.woods-.com/  This test is not yet approved or cleared by the [place] FDA and  has been authorized for detection and/or diagnosis of SARS-CoV-2 by FDA under an Emergency Use Authorization (EUA). This EUA will remain  in effect  (meaning this test can be used) for the duration of the COVID-19 declaration under Se ction 564(b)(1) of the Act, 21 U.S.C. section 360bbb-3(b)(1), unless the authorization is terminated or revoked sooner.  Performed at [facility], [place] [address] [place], [place] [address]   Culture, Urine     Status: Abnormal   Collection Time: 06/18/21 10:00 AM   Specimen: Urine, Catheterized  Result Value Ref Range Status   Specimen Description   Final    URINE, CATHETERIZED Performed at [facility] [address] [place], [place] [address]    Special Requests   Final    NONE Performed at [facility], [place] [address] [place],  [address]    Culture >=100,000 COLONIES/mL ESCHERICHIA COLI (A)  Final   Report Status 06/20/2021 FINAL  Final   Organism ID, Bacteria ESCHERICHIA COLI (A)  Final      Susceptibility   Escherichia coli - MIC*    AMPICILLIN 8 SENSITIVE Sensitive     CEFAZOLIN <=4 SENSITIVE Sensitive     CEFEPIME <=0.12 SENSITIVE Sensitive     CEFTRIAXONE <=0.25 SENSITIVE Sensitive     CIPROFLOXACIN <=0.25 SENSITIVE Sensitive     GENTAMICIN <=1 SENSITIVE Sensitive     IMIPENEM <=0.25 SENSITIVE Sensitive     NITROFURANTOIN <=16 SENSITIVE Sensitive     TRIMETH/SULFA <=20 SENSITIVE Sensitive     AMPICILLIN/SULBACTAM <=2 SENSITIVE Sensitive     PIP/TAZO <=4 SENSITIVE Sensitive     * >=100,000 COLONIES/mL ESCHERICHIA COLI         Radiology Studies: No results found.      Scheduled Meds:  Chlorhexidine Gluconate Cloth  6 each Topical Daily   dexamethasone  4 mg Oral Q12H   heparin lock flush  500 Units Intracatheter Q30 days   mometasone-formoterol  2 puff Inhalation BID   morphine  30 mg Oral Q12H   pantoprazole (PROTONIX) IV  40 mg Intravenous Q12H   prochlorperazine  10 mg Intravenous Q6H   sodium chloride flush  10-40 mL Intracatheter Q12H   sucralfate  1 g Oral Q6H   Continuous Infusions:  dextrose 5 % and  0.9%  NaCl 1 mL (06/24/21 0430)   promethazine (PHENERGAN) injection (IM or IVPB) Stopped (06/22/21 1203)     LOS: 7 days    Time spent: 40 min    Georgette Shell, MD  06/24/2021, 12:46 PM

## 2021-06-24 NOTE — Progress Notes (Signed)
Nutrition Note  Received consult for re-assessment and Calorie Count for pt. Currently on clears. Potential need for feeding tube.  Unlikely to meet needs on clears. Have ordered Boost Breeze for additional kcals and protein.  Results and full note to follow.  Clayton Bibles, MS, RD, LDN Inpatient Clinical Dietitian Contact information available via Amion

## 2021-06-24 NOTE — Progress Notes (Signed)
HEMATOLOGY-ONCOLOGY PROGRESS NOTE  SUBJECTIVE: Ms. Ballester continues to have nausea and vomiting.  Abdominal pain has improved.  Oncology History  Malignant neoplasm of head of pancreas ([place])  02/17/2021 Initial Diagnosis   Malignant neoplasm of head of pancreas ([place])    02/25/2021 Cancer Staging   Staging form: Exocrine Pancreas, AJCC 8th Edition - Clinical: Stage IV (cT3, cN0, cM1) - Signed by Ladell Pier, MD on 02/25/2021  Total positive nodes: 0    03/17/2021 -  Chemotherapy    Patient is on Treatment Plan: PANCREAS MODIFIED FOLFIRINOX Q14D X 4 CYCLES       03/23/2021 Genetic Testing   Negative hereditary cancer genetic testing: no pathogenic variants detected in [facility] Multi-Cancer Panel + Pancreatitis Genes.  The report date is March 23, 2021.   The Multi-Cancer Panel with pancreatitis genes and preliminary pancreatic cancer genes offered by [facility] includes sequencing and/or deletion duplication testing of the following 91 genes: AIP, ALK, APC, ATM, AXIN2,BAP1,  BARD1, BLM, BMPR1A, BRCA1, BRCA2, BRIP1, CASR, CDC73, CDH1, CDK4, CDKN1B, CDKN1C, CDKN2A (p14ARF), CDKN2A (p16INK4a), CEBPA, CFTR, CHEK2, CPA1, CTNNA1, CTRC, DICER1, DIS3L2, EGFR (c.2369C>T, p.Thr790Met variant only), EPCAM (Deletion/duplication testing only), FANCC, FH, FLCN, GATA2, GPC3, GREM1 (Promoter region deletion/duplication testing only), HOXB13 (c.251G>A, p.Gly84Glu), HRAS, KIT, MAX, MEN1, MET, MITF (c.952G>A, p.Glu318Lys variant only), MLH1, MSH2, MSH3, MSH6, MUTYH, NBN, NF1, NF2, NTHL1, PALB2, PALLD, PDGFRA, PHOX2B, PMS2, POLD1, POLE, POT1, PRKAR1A, PRSS1, PTCH1, PTEN, RAD50, RAD51C, RAD51D, RB1, RECQL4, RET, RNF43, RUNX1, SDHAF2, SDHA (sequence changes only), SDHB, SDHC, SDHD, SMAD4, SMARCA4, SMARCB1, SMARCE1, SPINK1, STK11, SUFU, TERC, TERT, TMEM127, TP53, TSC1, TSC2, VHL, WRN and WT1.     PHYSICAL EXAMINATION:  Vitals:   06/24/21 0623 06/24/21 0700  BP: (!) 158/91   Pulse: (!) 55   Resp: 14   Temp:  98.8 F (37.1 C)   SpO2: 100% 100%   Intake/Output from previous day: 07/12 0701 - 07/13 0700 In: 2099.4 [P.O.:600; I.V.:999.4; IV Piggyback:500] Out: -   OROPHARYNX: No thrush ABDOMEN: Mild tenderness in the mid upper abdomen NEURO: alert & oriented x 3 with fluent speech, no focal motor/sensory deficits Vascular: No leg edema  LABORATORY DATA:  I have reviewed the data as listed CMP Latest Ref Rng & Units 06/24/2021 06/23/2021 06/22/2021  Glucose 70 - 99 mg/dL 132(H) 92 102(H)  BUN 6 - 20 mg/dL <5(L) <5(L) 7  Creatinine 0.44 - 1.00 mg/dL 0.94 0.97 1.15(H)  Sodium 135 - 145 mmol/L 139 141 142  Potassium 3.5 - 5.1 mmol/L 3.9 3.3(L) 3.4(L)  Chloride 98 - 111 mmol/L 109 110 110  CO2 22 - 32 mmol/L '23 24 24  ' Calcium 8.9 - 10.3 mg/dL 8.4(L) 8.2(L) 8.3(L)  Total Protein 6.5 - 8.1 g/dL - - -  Total Bilirubin 0.3 - 1.2 mg/dL - - -  Alkaline Phos 38 - 126 U/L - - -  AST 15 - 41 U/L - - -  ALT 0 - 44 U/L - - -    Lab Results  Component Value Date   WBC 4.8 06/24/2021   HGB 9.8 (L) 06/24/2021   HCT 30.0 (L) 06/24/2021   MCV 90.4 06/24/2021   PLT 150 06/24/2021   NEUTROABS 3.8 06/22/2021    CT ABDOMEN PELVIS WO CONTRAST  Result Date: 06/17/2021 CLINICAL DATA:  Rule out bowel obstruction. History of pancreas cancer. EXAM: CT ABDOMEN AND PELVIS WITHOUT CONTRAST TECHNIQUE: Multidetector CT imaging of the abdomen and pelvis was performed following the standard protocol without IV contrast. COMPARISON:  05/23/2021  FINDINGS: Lower chest: No acute abnormality. Hepatobiliary: Within a background of diffuse hepatic steatosis there are multiple liver metastases as noted previously: The dominant lesion within the lateral segment of left hepatic lobe measures 7.8 cm, image 26/2. Previously this measured the same. The dominant lesion within the right hepatic lobe measures 8.2 cm, image 25/2. Previously 8 cm. Status post cholecystectomy. No biliary dilatation. Pancreas: Head of pancreas mass is  suboptimally evaluated due to lack of IV contrast material. Subjectively, this does not appear significantly changed in size when compared with the previous contrast enhanced MRI. Spleen: Normal in size without focal abnormality. Adrenals/Urinary Tract: Normal adrenal glands. No hydronephrosis identified bilaterally. Punctate stone noted within inferior pole of left kidney. Exophytic cyst is again noted arising off the posterior cortex of the interpolar right kidney measuring 1.3 cm. Urinary bladder appears collapsed. Stomach/Bowel: Stomach is nondistended. The appendix is visualized and appears normal. No pathologic dilatation of the large or small bowel loops. No significant bowel wall thickening or surrounding inflammatory fat stranding. Vascular/Lymphatic: Mild aortic atherosclerosis. No aneurysm. No abdominopelvic adenopathy identified. Reproductive: Status post hysterectomy. No adnexal masses.  The Other: No ascites or focal fluid collections. No peritoneal nodularity identified at this time. Musculoskeletal: No acute or significant osseous findings. IMPRESSION: 1. No acute findings within the abdomen or pelvis. No evidence for bowel obstruction. 2. Multiple liver metastases.  Similar to previous exam. 3. Head of pancreas mass is suboptimally evaluated due to lack of IV contrast material. Subjectively, this does not appear significantly changed in size when compared with the previous contrast enhanced MRI. 4. Aortic atherosclerosis. Aortic Atherosclerosis (ICD10-I70.0). Electronically Signed   By: Kerby Moors M.D.   On: 06/17/2021 19:07    ASSESSMENT AND PLAN: 1. Pancreas cancer -CT abdomen/pelvis without contrast 02/16/2021-multiple large hypoechoic masses in the patient's liver consistent metastatic disease, ill-defined masslike area in the pancreatic head measuring approximate 4.7 cm, possible filling defect in the right ventricle. -EGD 02/19/2021-large infiltrative mass with bleeding in the second  portion of the duodenum, appears to be arising near the ampulla, partially obstructive with friable mucosa.  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 -Flexible sigmoidoscopy 02/19/2021-diverticulosis in the sigmoid colon, nonbleeding external and internal hemorrhoids -Cardiac CT 02/20/2021-mass in the mid RV attached to the interventricular septum measuring 16 mm x 6 mm and concerning for metastatic tumor -MRI abdomen with/without contrast 02/21/2021-mass in the posterior pancreatic head measuring 4.3 x 3.8 cm with obstruction of the central pancreatic duct and diffuse dilatation up to 6 mm consistent with pancreatic adenocarcinoma, liver lesions the largest mass of the central left lobe measuring 9.9 x 9.2 cm consistent with hepatic metastatic disease,  hepatomegaly. -Ultrasound-guided biopsy of a left liver lesion 02/24/2021-adenocarcinoma, morphologically similar to the duodenal biopsy; microsatellite stable, tumor mutation burden 1 -Negative genetic testing -Palliative radiation to the duodenal mass 02/25/2021-03/06/2021 -Cycle 1 FOLFOX 03/17/2021 -Cycle 2 FOLFOX 04/01/2021 -Cycle 3 FOLFIRINOX 04/14/2021 -Cycle 4 FOLFIRINOX 04/27/2021, Udenyca added -Cycle 5 FOLFIRINOX 05/12/2021, Udenyca -MRI abdomen 05/23/2021-decrease in size of pancreas mass and hepatic lesions, no progressive disease -Cycle 6 FOLFIRINOX 05/27/2021 2.  Iron deficiency anemia-improved -Feraheme 510 mg 02/20/2021 3.  Protein calorie malnutrition 4.  Possible right ventricular filling defect on noncontrast CT scan MRI cardiac morphology 02/21/2021-mass in the mid RV attached to the interventricular septum measures 16 mm x 6 mm.  Mass appears heterogeneous with area of enhancement on LGE imaging.  Mass is concerning for metastatic tumor.  Normal LV size and systolic function.  Normal  RV size and systolic function. 5.  Hypertension 6.  History of CVA 7.  Hyperlipidemia 8.  Hypothyroidism 9.  Morbid obesity 10.   Asthma 11.  Migraines 12.  Pain secondary to #1 13.  Port-A-Cath placement 02/25/2021 14.  Hypokalemia 04/01/2021-started a potassium supplement 15.  Hospital admission 06/17/2021-intractable nausea and vomiting EGD 06/19/2021-diffuse gastritis, nonobstructing duodenal mass, biopsy of stomach-no malignancy or H. pylori, duodenal mass-adenocarcinoma  Wendy Hoffman continues to have intractable nausea and vomiting.  I suspect her symptoms are related to the pancreas/duodenal tumor.  She does not appear to have a small bowel obstruction.  She could have gastroparesis. She agrees to a trial of metoclopramide.  We reviewed potential toxicities associated with metoclopramide including the chance of tardive dyskinesia.  She will continue antacid therapy and Decadron.  The pain is under better control with resuming MS Contin.  We will add oxycodone elixir for breakthrough pain.  We discussed the potential need for a feeding tube if she cannot maintain hydration/nutrition by mouth.  I will request the dietitian see her again. Recommendations: 1.  Continue antacid regimen 2.  Continue Decadron 3.  Continue MS Contin and begin oxycodone for breakthrough pain 4.  Trial of metoclopramide 5.  Nutrition follow-up   LOS: 7 days   Betsy Coder, MD 06/24/21

## 2021-06-24 NOTE — Plan of Care (Signed)
Pt was able to sleep comfortably through the night; no s/s of acute distress or pain reported or observed; pain well controlled with RX; call light within reach and bed at lowest position for safety.

## 2021-06-25 DIAGNOSIS — C250 Malignant neoplasm of head of pancreas: Secondary | ICD-10-CM | POA: Diagnosis not present

## 2021-06-25 DIAGNOSIS — R112 Nausea with vomiting, unspecified: Secondary | ICD-10-CM | POA: Diagnosis not present

## 2021-06-25 LAB — COMPREHENSIVE METABOLIC PANEL
ALT: 16 U/L (ref 0–44)
AST: 47 U/L — ABNORMAL HIGH (ref 15–41)
Albumin: 3.1 g/dL — ABNORMAL LOW (ref 3.5–5.0)
Alkaline Phosphatase: 80 U/L (ref 38–126)
Anion gap: 10 (ref 5–15)
BUN: 9 mg/dL (ref 6–20)
CO2: 21 mmol/L — ABNORMAL LOW (ref 22–32)
Calcium: 8.6 mg/dL — ABNORMAL LOW (ref 8.9–10.3)
Chloride: 106 mmol/L (ref 98–111)
Creatinine, Ser: 1.11 mg/dL — ABNORMAL HIGH (ref 0.44–1.00)
GFR, Estimated: 59 mL/min — ABNORMAL LOW (ref >60)
Glucose, Bld: 121 mg/dL — ABNORMAL HIGH (ref 70–99)
Potassium: 3.7 mmol/L (ref 3.5–5.1)
Sodium: 137 mmol/L (ref 135–145)
Total Bilirubin: 0.7 mg/dL (ref 0.3–1.2)
Total Protein: 5.7 g/dL — ABNORMAL LOW (ref 6.5–8.1)

## 2021-06-25 MED ORDER — SCOPOLAMINE 1 MG/3DAYS TD PT72
1.0000 | MEDICATED_PATCH | TRANSDERMAL | Status: DC
  Administered 2021-06-25 – 2021-07-04 (×5): 1.5 mg via TRANSDERMAL
  Filled 2021-06-25 (×5): qty 1

## 2021-06-25 MED ORDER — SODIUM CHLORIDE 0.9 % IV SOLN
500.0000 mg | Freq: Three times a day (TID) | INTRAVENOUS | Status: DC
  Administered 2021-06-25 – 2021-07-01 (×18): 500 mg via INTRAVENOUS
  Filled 2021-06-25 (×30): qty 10

## 2021-06-25 MED ORDER — METOCLOPRAMIDE HCL 5 MG/ML IJ SOLN
5.0000 mg | Freq: Four times a day (QID) | INTRAMUSCULAR | Status: AC
  Filled 2021-06-25: qty 2

## 2021-06-25 MED ORDER — LORAZEPAM 2 MG/ML IJ SOLN
0.5000 mg | INTRAMUSCULAR | Status: DC | PRN
  Administered 2021-06-25 – 2021-07-05 (×2): 0.5 mg via INTRAVENOUS
  Filled 2021-06-25 (×2): qty 1

## 2021-06-25 NOTE — Progress Notes (Signed)
[facility] Gastroenterology Progress Note  Anaiz Qazi 55 y.o. 09/28/1966  CC:  Intractable nausea/vomiting  Subjective: Patient reports continued nausea/vomiting.  Feels as if she swallows fluids and vomits within a couple of minutes. Has abdominal pain during vomiting. Denies diarrhea/constipation.  ROS : Review of Systems  Cardiovascular:  Negative for chest pain and palpitations.  Gastrointestinal:  Positive for abdominal pain, nausea and vomiting. Negative for blood in stool, constipation, diarrhea, heartburn and melena.     Objective: Vital signs in last 24 hours: Vitals:   06/25/21 0424 06/25/21 0433  BP: (!) 179/112 (!) 149/101  Pulse: 66 (!) 52  Resp: 14   Temp: 98.6 F (37 C)   SpO2: 100%     Physical Exam:  General:  Lethargic, cooperative, no distress  Head:  Normocephalic, without obvious abnormality, atraumatic  Eyes:  Anicteric sclera, EOMs intact  Lungs:   Clear to auscultation bilaterally, respirations unlabored  Heart:  Regular rate and rhythm, S1, S2 normal  Abdomen:   Soft with mild diffuse tenderness, bowel sounds active all four quadrants  Extremities: Extremities normal, atraumatic, no  edema    Lab Results: Recent Labs    06/23/21 0500 06/24/21 0557  NA 141 139  K 3.3* 3.9  CL 110 109  CO2 24 23  GLUCOSE 92 132*  BUN <5* <5*  CREATININE 0.97 0.94  CALCIUM 8.2* 8.4*  MG 1.8 1.6*  PHOS 2.5 2.7   Recent Labs    06/23/21 0500 06/24/21 0557  ALBUMIN 2.7* 2.8*   Recent Labs    06/23/21 0500 06/24/21 0557  WBC 4.6 4.8  HGB 9.5* 9.8*  HCT 29.3* 30.0*  MCV 91.6 90.4  PLT 137* 150   No results for input(s): LABPROT, INR in the last 72 hours.    Assessment: Intractable nausea/vomiting in the setting of metastatic pancreatic/duodenal adenocarcinoma -EGD 7/8 revealed normal-appearing esophagus, severe gastritis in the prepyloric area (bx negative for H pylori) and small around 1 cm  nonobstructive mass in the second portion of the  duodenum (bx: duodenal adenocarcinoma)  Plan: Start IV erythromycin (500 mg IV q8h).  Narcotic use precludes gastric emptying study.  If no improvement on erythromycin, recommend repeat CT with contrast vs. Repeat MRI with contrast.  Repeat LFTs.  Continue Protonix IV BID and Carafate slurry QID.  Continue supportive care.  [facility] will follow.  Salley Slaughter PA-C 06/25/2021, 9:32 AM  Contact #  [phone]

## 2021-06-25 NOTE — Progress Notes (Addendum)
HEMATOLOGY-ONCOLOGY PROGRESS NOTE  SUBJECTIVE: [name] continues to have persistent nausea and vomiting.  Reports ongoing abdominal pain which has improved.  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  The report date is March 23, 2021.   The Multi-Cancer Panel with pancreatitis genes and preliminary pancreatic cancer genes offered by [facility] includes sequencing and/or deletion duplication testing of the following 91 genes: AIP, ALK, APC, ATM, AXIN2,BAP1,  BARD1, BLM, BMPR1A, BRCA1, BRCA2, BRIP1, CASR, CDC73, CDH1, CDK4, CDKN1B, CDKN1C, CDKN2A (p14ARF), CDKN2A (p16INK4a), CEBPA, CFTR, CHEK2, CPA1, CTNNA1, CTRC, DICER1, DIS3L2, EGFR (c.2369C>T, p.Thr790Met variant only), EPCAM (Deletion/duplication testing only), FANCC, FH, FLCN, GATA2, GPC3, GREM1 (Promoter region deletion/duplication testing only), HOXB13 (c.251G>A, p.Gly84Glu), HRAS, KIT, MAX, MEN1, MET, MITF (c.952G>A, p.Glu318Lys variant only), MLH1, MSH2, MSH3, MSH6, MUTYH, NBN, NF1, NF2, NTHL1, PALB2, PALLD, PDGFRA, PHOX2B, PMS2, POLD1, POLE, POT1, PRKAR1A, PRSS1, PTCH1, PTEN, RAD50, RAD51C, RAD51D, RB1, RECQL4, RET, RNF43, RUNX1, SDHAF2, SDHA (sequence changes only), SDHB, SDHC, SDHD, SMAD4, SMARCA4, SMARCB1, SMARCE1, SPINK1, STK11, SUFU, TERC, TERT, TMEM127, TP53, TSC1, TSC2, VHL, WRN and WT1.     PHYSICAL EXAMINATION:  Vitals:   06/25/21 0424 06/25/21 0433  BP: (!) 179/112 (!)  149/101  Pulse: 66 (!) 52  Resp: 14   Temp: 98.6 F (37 C)   SpO2: 100%    Intake/Output from previous day: No intake/output data recorded.  OROPHARYNX: No thrush ABDOMEN: Mild tenderness in the mid upper abdomen NEURO: alert & oriented x 3 with fluent speech, no focal motor/sensory deficits Vascular: No leg edema  LABORATORY DATA:  I have reviewed the data as listed CMP Latest Ref Rng & Units 06/24/2021 06/23/2021 06/22/2021  Glucose 70 - 99 mg/dL 132(H) 92 102(H)  BUN 6 - 20 mg/dL <5(L) <5(L) 7  Creatinine 0.44 - 1.00 mg/dL 0.94 0.97 1.15(H)  Sodium 135 - 145 mmol/L 139 141 142  Potassium 3.5 - 5.1 mmol/L 3.9 3.3(L) 3.4(L)  Chloride 98 - 111 mmol/L 109 110 110  CO2 22 - 32 mmol/L '23 24 24  ' Calcium 8.9 - 10.3 mg/dL 8.4(L) 8.2(L) 8.3(L)  Total Protein 6.5 - 8.1 g/dL - - -  Total Bilirubin 0.3 - 1.2 mg/dL - - -  Alkaline Phos 38 - 126 U/L - - -  AST 15 - 41 U/L - - -  ALT 0 - 44 U/L - - -    Lab Results  Component Value Date   WBC 4.8 06/24/2021   HGB 9.8 (L) 06/24/2021   HCT 30.0 (L) 06/24/2021   MCV 90.4 06/24/2021   PLT 150 06/24/2021   NEUTROABS 3.8 06/22/2021    CT ABDOMEN PELVIS WO CONTRAST  Result Date: 06/17/2021 CLINICAL DATA:  Rule out bowel obstruction. History of pancreas cancer. EXAM: CT ABDOMEN AND PELVIS WITHOUT CONTRAST TECHNIQUE: Multidetector CT imaging of the abdomen and pelvis was performed following the standard protocol without IV contrast. COMPARISON:  05/23/2021 FINDINGS: Lower chest: No acute  abnormality. Hepatobiliary: Within a background of diffuse hepatic steatosis there are multiple liver metastases as noted previously: The dominant lesion within the lateral segment of left hepatic lobe measures 7.8 cm, image 26/2. Previously this measured the same. The dominant lesion within the right hepatic lobe measures 8.2 cm, image 25/2. Previously 8 cm. Status post cholecystectomy. No biliary dilatation. Pancreas: Head of pancreas mass is suboptimally  evaluated due to lack of IV contrast material. Subjectively, this does not appear significantly changed in size when compared with the previous contrast enhanced MRI. Spleen: Normal in size without focal abnormality. Adrenals/Urinary Tract: Normal adrenal glands. No hydronephrosis identified bilaterally. Punctate stone noted within inferior pole of left kidney. Exophytic cyst is again noted arising off the posterior cortex of the interpolar right kidney measuring 1.3 cm. Urinary bladder appears collapsed. Stomach/Bowel: Stomach is nondistended. The appendix is visualized and appears normal. No pathologic dilatation of the large or small bowel loops. No significant bowel wall thickening or surrounding inflammatory fat stranding. Vascular/Lymphatic: Mild aortic atherosclerosis. No aneurysm. No abdominopelvic adenopathy identified. Reproductive: Status post hysterectomy. No adnexal masses.  The Other: No ascites or focal fluid collections. No peritoneal nodularity identified at this time. Musculoskeletal: No acute or significant osseous findings. IMPRESSION: 1. No acute findings within the abdomen or pelvis. No evidence for bowel obstruction. 2. Multiple liver metastases.  Similar to previous exam. 3. Head of pancreas mass is suboptimally evaluated due to lack of IV contrast material. Subjectively, this does not appear significantly changed in size when compared with the previous contrast enhanced MRI. 4. Aortic atherosclerosis. Aortic Atherosclerosis (ICD10-I70.0). Electronically Signed   By: Kerby Moors M.D.   On: 06/17/2021 19:07    ASSESSMENT AND PLAN: 1. Pancreas cancer -CT abdomen/pelvis without contrast 02/16/2021-multiple large hypoechoic masses in the patient's liver consistent metastatic disease, ill-defined masslike area in the pancreatic head measuring approximate 4.7 cm, possible filling defect in the right ventricle. -EGD 02/19/2021-large infiltrative mass with bleeding in the second portion of the  duodenum, appears to be arising near the ampulla, partially obstructive with friable mucosa.  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 -Flexible sigmoidoscopy 02/19/2021-diverticulosis in the sigmoid colon, nonbleeding external and internal hemorrhoids -Cardiac CT 02/20/2021-mass in the mid RV attached to the interventricular septum measuring 16 mm x 6 mm and concerning for metastatic tumor -MRI abdomen with/without contrast 02/21/2021-mass in the posterior pancreatic head measuring 4.3 x 3.8 cm with obstruction of the central pancreatic duct and diffuse dilatation up to 6 mm consistent with pancreatic adenocarcinoma, liver lesions the largest mass of the central left lobe measuring 9.9 x 9.2 cm consistent with hepatic metastatic disease,  hepatomegaly. -Ultrasound-guided biopsy of a left liver lesion 02/24/2021-adenocarcinoma, morphologically similar to the duodenal biopsy; microsatellite stable, tumor mutation burden 1 -Negative genetic testing -Palliative radiation to the duodenal mass 02/25/2021-03/06/2021 -Cycle 1 FOLFOX 03/17/2021 -Cycle 2 FOLFOX 04/01/2021 -Cycle 3 FOLFIRINOX 04/14/2021 -Cycle 4 FOLFIRINOX 04/27/2021, Udenyca added -Cycle 5 FOLFIRINOX 05/12/2021, Udenyca -MRI abdomen 05/23/2021-decrease in size of pancreas mass and hepatic lesions, no progressive disease -Cycle 6 FOLFIRINOX 05/27/2021 2.  Iron deficiency anemia-improved -Feraheme 510 mg 02/20/2021 3.  Protein calorie malnutrition 4.  Possible right ventricular filling defect on noncontrast CT scan MRI cardiac morphology 02/21/2021-mass in the mid RV attached to the interventricular septum measures 16 mm x 6 mm.  Mass appears heterogeneous with area of enhancement on LGE imaging.  Mass is concerning for metastatic tumor.  Normal LV size and systolic function.  Normal RV size and systolic function.  5.  Hypertension 6.  History of CVA 7.  Hyperlipidemia 8.  Hypothyroidism 9.  Morbid obesity 10.  Asthma 11.   Migraines 12.  Pain secondary to #1 13.  Port-A-Cath placement 02/25/2021 14.  Hypokalemia 04/01/2021-started a potassium supplement 15.  Hospital admission 06/17/2021-intractable nausea and vomiting EGD 06/19/2021-diffuse gastritis, nonobstructing duodenal mass, biopsy of stomach-no malignancy or H. pylori, duodenal mass-adenocarcinoma  Ms. Tavares continues to have intractable nausea and vomiting.  I suspect her symptoms are related to the pancreas/duodenal tumor.  She does not appear to have a small bowel obstruction.  She could have gastroparesis.  She has been trialed on several antiemetics including Zofran, Compazine, and Phenergan.  Metoclopramide was ordered yesterday but not started due to concern for "allergy"-nausea and vomiting.  She was also started on dexamethasone 4 mg twice daily.  All these have been ineffective in controlling her nausea and vomiting.  She also remains on PPI and Carafate.  The patient may benefit from a gastric emptying study and we will asked GI to please reconsult for additional recommendations.  The patient may need a G-tube versus J-tube.  If she is unable to maintain her nutrition and have control of her nausea and vomiting, may need to consider hospice.  The pain is under better control with resuming MS Contin.  She has oxycodone elixir available to her for breakthrough pain.  She also has IV hydromorphone available to her as well.  Recommendations: 1.  Continue antacid regimen. 2.  We will discontinue dexamethasone as it has been ineffective. 3.  Continue MS Contin and begin oxycodone for breakthrough pain 4.  Reconsult GI-patient may benefit from a gastric emptying study.   LOS: 8 days   Wendy Hoffman 06/25/21 Ms. Talmadge was interviewed and examined.  She continues to have abdominal pain.  She has intractable nausea and vomiting despite the current medical regimen.  She reports metoclopramide caused nausea and vomiting during a previous hospital  admission.  I wonder whether there is an element of gastroparesis or the intractable nausea may be directly related to the pancreas/duodenum tumor.  She will need a feeding tube if she cannot maintain her hydration/nutrition.  I was present for greater than 50% of today's visit.  I performed medical decision making.  Wendy Manson, MD

## 2021-06-25 NOTE — Progress Notes (Signed)
PROGRESS NOTE    Wendy Hoffman  LNL:[id number] DOB: 10/05/1966 DOA: 06/17/2021 PCP: Patient, No Pcp Per (Inactive)    Brief Narrative: 55 y.o. female with medical history significant of pancreatic CA, HTN, HLD. Presenting with intractable N/V. She reports that she's had several days of N/V. It's worse with eating. Both solids and liquids come back up within 30 minutes. He home nausea meds do not help. She went to clinic for her normal follow up and chemo session. She started vomiting and could not stop. There was no blood in her vomit. Labs were drawn. She was found to be in AKI. It was recommended that she come to the ED for help. She denies any other aggravating or alleviating factors.    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  Patient reports she vomited this morning.  She is on clear liquids.  This is thought to be secondary to pancreatic cancer with partially obstructing infiltrating duodenal mass/duodenal adenocarcinoma per EGD in March 2022. She has been treated for the oral thrush. CT abdomen and pelvis shows no evidence of bowel obstruction. We will start her on Reglan though it is listed as allergy she reports she gets nauseated with Reglan.  We will try scopolamine patch. Will also try Ativan. ?  GJ tube if continues with vomiting versus hospice care.  #2 e coli uti pansensitive sensitive finished 3 days of IV antibiotics.  #3 odynophagia due to gastritis status post EGD normal  esophagus severe gastritis in the prepyloric area nonobstructive mass in the duodenum biopsies pending.  On clear liquid diet.  Continue IV PPI.  #4 AKI creatinine on admission was 3.25 improving with IV fluids.  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      Nutrition Problem: Moderate Malnutrition Etiology: chronic illness, cancer and cancer related treatments     Signs/Symptoms: percent weight loss, energy intake < or equal to 75% for > or equal to 1 month, mild muscle depletion    Interventions: Refer to RD note for recommendations  Estimated body mass index is 38.62 kg/m as calculated from the following:   Height as of this encounter: 5\' 4"  (1.626 m).   Weight as of this encounter: 102.1 kg.  DVT prophylaxis:scd Code Status:  Family Communication:  Disposition Plan:  Status is: Inpatient  Remains inpatient appropriate because:IV treatments appropriate due to intensity of illness or inability to take PO  Dispo: The patient is from: Home              Anticipated d/c is to: Home              Patient currently is not medically stable to d/c.   Difficult to place patient No    Consultants: GI and oncology  Procedures: Upper endoscopy 06/19/2021 Antimicrobials: Status post Levaquin and Rocephin and Diflucan Subjective:  Patient continues with nausea and vomiting unable to keep even clear liquids down  she had a bowel movement yesterday No abdominal pain Objective: Vitals:   06/24/21 2017 06/25/21 0424 06/25/21 0433 06/25/21 1100  BP: (!) 154/97 (!) 179/112 (!) 149/101   Pulse: 78 66 (!) 52   Resp: 16 14    Temp: 98.5 F (36.9 C) 98.6 F (37 C)    TempSrc: Oral Oral    SpO2: 100% 100%    Weight:    102.1 kg  Height:       No intake or output data in the 24 hours ending 06/25/21 1332  Filed Weights   06/25/21 1100  Weight: 102.1 kg     Examination:  General exam: Appears calm and comfortable  Respiratory system: Clear to auscultation. Respiratory effort normal. Cardiovascular system: S1 & S2 heard, RRR. No JVD, murmurs, rubs, gallops or clicks. No pedal edema. Gastrointestinal system: Abdomen is nondistended, soft and nontender. No organomegaly or masses felt. Normal bowel sounds heard. Central nervous system: Alert and oriented. No focal neurological deficits. Extremities: Symmetric 5 x 5 power. Skin: No rashes, lesions or ulcers Psychiatry: Judgement and insight appear normal. Mood & affect appropriate.     Data Reviewed: I have personally reviewed following labs and imaging studies  CBC: Recent Labs  Lab 06/20/21 0527 06/21/21 0652 06/22/21 0458 06/23/21 0500 06/24/21 0557  WBC 4.7 4.8 5.5 4.6 4.8  NEUTROABS  --  3.0 3.8  --   --   HGB 10.1* 9.9* 9.8* 9.5* 9.8*  HCT 31.8* 30.2* 31.0* 29.3* 30.0*  MCV 92.7 90.7 92.0 91.6 90.4  PLT 128* 126* 134* 137* 735    Basic Metabolic Panel: Recent Labs  Lab 06/20/21 0527 06/21/21 0652 06/22/21 0458 06/23/21 0500 06/24/21 0557 06/25/21 1055  NA 140 145 142 141 139 137  K 3.6 3.4* 3.4* 3.3* 3.9 3.7  CL 107 112* 110 110 109 106  CO2 25 26 24 24 23  21*  GLUCOSE 93 97 102* 92 132* 121*  BUN 16 10 7  <5* <5* 9  CREATININE 1.50* 1.26* 1.15* 0.97 0.94 1.11*  CALCIUM 8.4* 8.4* 8.3* 8.2* 8.4* 8.6*  MG 1.7 2.0 1.6* 1.8 1.6*  --   PHOS 2.3* 2.7 3.1 2.5 2.7  --     GFR: Estimated Creatinine Clearance: 68.2 mL/min (A) (by C-G formula based on SCr of 1.11 mg/dL (H)). Liver Function Tests: Recent Labs  Lab 06/21/21 3299 06/22/21 0458 06/23/21 0500 06/24/21 0557 06/25/21 1055  AST  --   --   --   --  47*  ALT  --   --   --   --  16  ALKPHOS  --   --   --   --  80  BILITOT  --   --   --   --  0.7  PROT  --   --   --   --  5.7*  ALBUMIN 2.5* 2.7* 2.7* 2.8* 3.1*    No results for input(s): LIPASE, AMYLASE in the last 168 hours. No results for  input(s): AMMONIA in the last 168 hours. Coagulation Profile: No results for input(s): INR, PROTIME in the last 168 hours. Cardiac Enzymes: No results for input(s): CKTOTAL, CKMB, CKMBINDEX, TROPONINI in the last 168 hours. BNP (last 3 results) No results for input(s): PROBNP in the last 8760 hours. HbA1C: No results for input(s): HGBA1C in the last 72 hours. CBG: Recent Labs  Lab 06/19/21 0936 06/19/21 1155 06/23/21 0856 06/23/21 2151 06/24/21 0021  GLUCAP 102* 101* 91 125* 124*    Lipid  Profile: No results for input(s): CHOL, HDL, LDLCALC, TRIG, CHOLHDL, LDLDIRECT in the last 72 hours. Thyroid Function Tests: No results for input(s): TSH, T4TOTAL, FREET4, T3FREE, THYROIDAB in the last 72 hours. Anemia Panel: No results for input(s): VITAMINB12, FOLATE, FERRITIN, TIBC, IRON, RETICCTPCT in the last 72 hours. Sepsis Labs: No results for input(s): PROCALCITON, LATICACIDVEN in the last 168 hours.  Recent Results (from the past 240 hour(s))  SARS CORONAVIRUS 2 (TAT 6-24 HRS) Nasopharyngeal Nasopharyngeal Swab     Status: None   Collection Time: 06/17/21  4:20 PM   Specimen: Nasopharyngeal Swab  Result Value Ref Range Status   SARS Coronavirus 2 NEGATIVE NEGATIVE Final    Comment: (NOTE) SARS-CoV-2 target nucleic acids are NOT DETECTED.  The SARS-CoV-2 RNA is generally detectable in upper and lower respiratory specimens during the acute phase of infection. Negative results do not preclude SARS-CoV-2 infection, do not rule out co-infections with other pathogens, and should not be used as the sole basis for treatment or other patient management decisions. Negative results must be combined with clinical observations, patient history, and epidemiological information. The expected result is Negative.  Fact Sheet for Patients: SugarRoll.be  Fact Sheet for Healthcare Providers: https://www.woods-.com/  This test is not yet  approved or cleared by the [place] FDA and  has been authorized for detection and/or diagnosis of SARS-CoV-2 by FDA under an Emergency Use Authorization (EUA). This EUA will remain  in effect (meaning this test can be used) for the duration of the COVID-19 declaration under Se ction 564(b)(1) of the Act, 21 U.S.C. section 360bbb-3(b)(1), unless the authorization is terminated or revoked sooner.  Performed at [facility], [place] [address], [place], [place] [address]   Culture, Urine     Status: Abnormal   Collection Time: 06/18/21 10:00 AM   Specimen: Urine, Catheterized  Result Value Ref Range Status   Specimen Description   Final    URINE, CATHETERIZED Performed at [facility] [address] [place], [place] [address]    Special Requests   Final    NONE Performed at [facility], [place] [address] [place],  [address]    Culture >=100,000 COLONIES/mL ESCHERICHIA COLI (A)  Final   Report Status 06/20/2021 FINAL  Final   Organism ID, Bacteria ESCHERICHIA COLI (A)  Final      Susceptibility   Escherichia coli - MIC*    AMPICILLIN 8 SENSITIVE Sensitive     CEFAZOLIN <=4 SENSITIVE Sensitive     CEFEPIME <=0.12 SENSITIVE Sensitive     CEFTRIAXONE <=0.25 SENSITIVE Sensitive     CIPROFLOXACIN <=0.25 SENSITIVE Sensitive     GENTAMICIN <=1 SENSITIVE Sensitive     IMIPENEM <=0.25 SENSITIVE Sensitive     NITROFURANTOIN <=16 SENSITIVE Sensitive     TRIMETH/SULFA <=20 SENSITIVE Sensitive     AMPICILLIN/SULBACTAM <=2 SENSITIVE Sensitive     PIP/TAZO <=4 SENSITIVE Sensitive     * >=100,000 COLONIES/mL ESCHERICHIA COLI          Radiology Studies: No results found.      Scheduled Meds:  Chlorhexidine Gluconate Cloth  6 each Topical Daily   feeding supplement  1 Container Oral TID BM   heparin lock flush  500 Units Intracatheter Q30 days   mometasone-formoterol  2 puff Inhalation BID   morphine  30 mg Oral Q12H    pantoprazole (PROTONIX) IV  40 mg Intravenous Q12H   scopolamine  1 patch Transdermal Q72H   sodium chloride flush  10-40 mL Intracatheter Q12H   sucralfate  1 g Oral Q6H   Continuous Infusions:  dextrose 5 % and 0.9% NaCl 1 mL (06/25/21 0324)   erythromycin 500 mg (06/25/21 1251)   promethazine (PHENERGAN) injection (IM or IVPB) 12.5 mg (06/24/21 1602)     LOS: 8 days    Time spent: 40 min    Georgette Shell, MD  06/25/2021, 1:32 PM

## 2021-06-25 NOTE — Progress Notes (Signed)
Nutrition Follow-up  DOCUMENTATION CODES:   Obesity unspecified, Non-severe (moderate) malnutrition in context of chronic illness  INTERVENTION:   Recommend placement of feeding tube for nutrition support as pt has been unable to tolerate PO since admission (7 days ago).  -Boost Breeze po TID, each supplement provides 250 kcal and 9 grams of protein  -D/c Calorie Count -insubstantial amount to calculate  TF recommendations: Initiate Osmolite 1.5 @ 20 ml/hr, advance by 10 ml every 12 hours to goal rate of 55 ml/hr. Concern for refeeding syndrome.  NUTRITION DIAGNOSIS:   Moderate Malnutrition related to chronic illness, cancer and cancer related treatments as evidenced by percent weight loss, energy intake < or equal to 75% for > or equal to 1 month, mild muscle depletion.  Ongoing.  GOAL:   Patient will meet greater than or equal to 90% of their needs  Not meeting.  MONITOR:   PO intake, Supplement acceptance, Labs, Weight trends, I & O's  ASSESSMENT:   55 y.o. female with past medical history of metastatic pancreatic cancer with liver lesion as well as EGD in March showing large infiltrative duodenal mass.  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  Patient is s/p radiation and chemotherapy.  She was seen by oncology yesterday and was admitted to the hospital for persistent nausea and vomiting and rule out duodenal obstruction . CT abdomen pelvis without contrast negative for any obstruction.  7/8 s/p EGD  Patient in room, feels terrible. States so far, ginger ale has stayed down. She is willing to try Boost Breeze but states she was given Ensure yesterday and she didn't keep it down. Pt would like to be weighed to know where she is now. Weight increased , this is likely fluid as pt has not had any substantial intake since admission. D/c Calorie count as there was little to calculate.  Recommend tube feeding as pt has had poor nutritional intake for >7  days. At risk of worsening malnutrition.  Admission weight:  198 lbs. Current weight: 225 lbs.  I&Os: +13.7L since admit  Medications: Sucralfate, D5 infusion, IV Ativan, IV Zofran  Labs reviewed: CBGs: 91-125  Diet Order:   Diet Order             Diet clear liquid Room service appropriate? Yes; Fluid consistency: Thin  Diet effective now                   EDUCATION NEEDS:   No education needs have been identified at this time  Skin:  Skin Assessment: Reviewed RN Assessment  Last BM:  7/5  Height:   Ht Readings from Last 1 Encounters:  06/24/21 5\' 4"  (1.626 m)    Weight:   Wt Readings from Last 1 Encounters:  06/24/21 89.8 kg    BMI:  Body mass index is 33.99 kg/m.  Estimated Nutritional Needs:   Kcal:  1900-2100  Protein:  85-95g  Fluid:  2L/day   Clayton Bibles, MS, RD, LDN Inpatient Clinical Dietitian Contact information available via Amion

## 2021-06-25 NOTE — Plan of Care (Signed)
Pt tried to drink Ensure instead of Boost but couldn't keep it down; she will try Boost this am; pt was able to sleep through the night; no s/s of acute distress or pain observed or reported; resting comfortably in bed with call light within reach and bed at lowest position for safety.

## 2021-06-26 ENCOUNTER — Inpatient Hospital Stay (HOSPITAL_COMMUNITY): Payer: Medicaid Other

## 2021-06-26 DIAGNOSIS — C250 Malignant neoplasm of head of pancreas: Secondary | ICD-10-CM | POA: Diagnosis not present

## 2021-06-26 DIAGNOSIS — R112 Nausea with vomiting, unspecified: Secondary | ICD-10-CM | POA: Diagnosis not present

## 2021-06-26 MED ORDER — VANCOMYCIN HCL 1500 MG/300ML IV SOLN
1500.0000 mg | INTRAVENOUS | Status: AC
  Administered 2021-06-29: 1500 mg via INTRAVENOUS
  Filled 2021-06-26: qty 300

## 2021-06-26 MED ORDER — IOHEXOL 350 MG/ML SOLN
100.0000 mL | Freq: Once | INTRAVENOUS | Status: AC | PRN
  Administered 2021-06-26: 80 mL via INTRAVENOUS

## 2021-06-26 MED ORDER — MOMETASONE FURO-FORMOTEROL FUM 200-5 MCG/ACT IN AERO
2.0000 | INHALATION_SPRAY | Freq: Two times a day (BID) | RESPIRATORY_TRACT | Status: DC | PRN
  Administered 2021-06-27 – 2021-07-05 (×4): 2 via RESPIRATORY_TRACT
  Filled 2021-06-26: qty 8.8

## 2021-06-26 MED ORDER — VANCOMYCIN HCL 1500 MG/300ML IV SOLN: 1500.0000 mg | INTRAVENOUS | Status: DC

## 2021-06-26 MED ORDER — SODIUM CHLORIDE 0.9 % IV SOLN: INTRAVENOUS | Status: DC

## 2021-06-26 NOTE — Progress Notes (Signed)
Referring Physician(s): Mathews,E/Sherrill,B  Supervising Physician: Pilar Jarvis  Patient Status:  [facility] IP  Chief Complaint: Nausea/vomiting, pancreatic cancer   Subjective: Patient familiar to IR service from liver lesion biopsy and Port-A-Cath placement on 02/24/2021.  She has a history of pancreatic/duodenal cancer, iron deficiency anemia, protein calorie malnutrition, hypertension, prior CVA, hyperlipidemia, hypothyroidism, morbid obesity, asthma, migraines.  She presents now with abdominal pain along with intractable nausea and vomiting, likely related to pancreas/duodenal tumor.  She has no evidence of gastric outlet obstruction or bowel obstruction on recent imaging.  EGD showed no acute changes.  Esophagus appeared normal with severe gastritis in the prepyloric area and small 1 cm nonobstructing mass in the second portion of the duodenum.  Multiple medical interventions have not helped her nausea and vomiting. She  is not able to tolerate clears.  Request now received from primary team as well as oncology for [place] feeding tube placement.   Past Medical History:  Diagnosis Date   Anemia    Anxiety    ARF (acute renal failure) ([place]) 06/18/2021   Arthritis    Asthma    Cervical ca (HCC)    H/O: hysterectomy    High cholesterol    Hypertension    Hypothyroidism    Insomnia    Medical history non-contributory    Migraine    Morbid obesity ([place])    Pancreatic cancer ([place]) 02/2021   Shortness of breath    Stroke [facility])    Thyroid disease    Past Surgical History:  Procedure Laterality Date   ABDOMINAL HYSTERECTOMY     BIOPSY  02/19/2021   Procedure: BIOPSY;  Surgeon: Mauri Pole, MD;  Location: [facility]OSCOPY;  Service: Endoscopy;;   BIOPSY  06/19/2021   Procedure: BIOPSY;  Surgeon: Otis Brace, MD;  Location: [facility] ENDOSCOPY;  Service: Gastroenterology;;   CESAREAN SECTION     3 occasions   CHOLECYSTECTOMY     ESOPHAGOGASTRODUODENOSCOPY (EGD) WITH PROPOFOL N/A  02/19/2021   Procedure: ESOPHAGOGASTRODUODENOSCOPY (EGD) WITH PROPOFOL;  Surgeon: Mauri Pole, MD;  Location: [facility];  Service: Endoscopy;  Laterality: N/A;   ESOPHAGOGASTRODUODENOSCOPY (EGD) WITH PROPOFOL N/A 06/19/2021   Procedure: ESOPHAGOGASTRODUODENOSCOPY (EGD) WITH PROPOFOL;  Surgeon: Otis Brace, MD;  Location: [facility];  Service: Gastroenterology;  Laterality: N/A;   FLEXIBLE SIGMOIDOSCOPY N/A 02/19/2021   Procedure: FLEXIBLE SIGMOIDOSCOPY;  Surgeon: Mauri Pole, MD;  Location: [facility];  Service: Endoscopy;  Laterality: N/A;   IR IMAGING GUIDED PORT INSERTION  02/24/2021   IR US GUIDE BX ASP/DRAIN  02/24/2021   THYROIDECTOMY, PARTIAL     Goiter      Allergies: Blueberry [vaccinium angustifolium], Mango flavor, Oxaliplatin, Reglan [metoclopramide], Aspirin, and Penicillins  Medications: Prior to Admission medications   Medication Sig Start Date End Date Taking? Authorizing Provider  albuterol (VENTOLIN HFA) 108 (90 Base) MCG/ACT inhaler INHALE 1 TO 2 PUFFS BY MOUTH EVERY 6 HOURS AS NEEDED FOR WHEEZING AND FOR SHORTNESS OF BREATH Patient taking differently: Inhale 1-2 puffs into the lungs every 6 (six) hours as needed for wheezing or shortness of breath. 01/06/21 01/06/22 Yes Millsaps, Joelene Millin, NP  gabapentin (NEURONTIN) 300 MG capsule TAKE 1 CAPSULE AS NEEDED BY ORAL ROUTE AT BEDTIME. Patient taking differently: Take 300 mg by mouth at bedtime as needed (neuropathy). 01/06/21 01/06/22 Yes Millsaps, Joelene Millin, NP  lidocaine-prilocaine (EMLA) cream Apply 1 application topically as needed. Patient taking differently: Apply 1 application topically as needed (port access). 03/06/21  Yes Ladell Pier, MD  magic  mouthwash SOLN Take 5-10 mLs by mouth 4 (four) times daily as needed for mouth pain (swish and spit for mouth pain). 04/06/21  Yes Ladell Pier, MD  morphine (MS CONTIN) 30 MG 12 hr tablet Take 1 tablet (30 mg total) by mouth every 12 (twelve)  hours. 05/20/21  Yes Owens Shark, NP  oxyCODONE-acetaminophen (PERCOCET) 7.5-325 MG tablet Take 1 tablet by mouth every 4 (four) hours as needed for severe pain. 05/26/21  Yes Ladell Pier, MD  potassium chloride (MICRO-K) 10 MEQ CR capsule Take 2 capsules (20 mEq total) by mouth 2 (two) times daily. 06/05/21  Yes Ladell Pier, MD  prochlorperazine (COMPAZINE) 10 MG tablet TAKE 1 TABLET(10 MG) BY MOUTH EVERY 6 HOURS AS NEEDED FOR NAUSEA OR VOMITING Patient taking differently: Take 10 mg by mouth every 6 (six) hours as needed for nausea or vomiting. 05/04/21  Yes Ladell Pier, MD  SYMBICORT 160-4.5 MCG/ACT inhaler INHALE 2 PUFFS INTO THE LUNGS 2 (TWO) TIMES DAILY. Patient taking differently: Inhale 2 puffs into the lungs 2 (two) times daily. 03/07/20  Yes Charlott Rakes, MD  zolpidem (AMBIEN) 10 MG tablet Take 1 tablet (10 mg total) by mouth at bedtime as needed for sleep. Patient taking differently: Take 10 mg by mouth at bedtime. 04/14/21  Yes Owens Shark, NP  albuterol (PROVENTIL) (2.5 MG/3ML) 0.083% nebulizer solution Take 3 mLs (2.5 mg total) by nebulization every 4 (four) hours as needed for wheezing or shortness of breath. Patient not taking: No sig reported 78/6/76   Delora Fuel, MD  amLODipine (NORVASC) 10 MG tablet TAKE 1 TABLET BY MOUTH ONCE DAILY Patient not taking: No sig reported 01/06/21 01/06/22  Millsaps, Joelene Millin, NP  budesonide-formoterol (SYMBICORT) 160-4.5 MCG/ACT inhaler INHALE 2 PUFFS INTO THE LUNGS 2 (TWO) TIMES DAILY. Patient not taking: Reported on 06/17/2021 01/06/21 01/06/22  Everardo Beals, NP  dexlansoprazole (DEXILANT) 60 MG capsule TAKE 1 CAPSULE BY MOUTH ONCE DAILY Patient not taking: No sig reported 01/06/21 01/06/22  Everardo Beals, NP  magnesium oxide (MAG-OX) 400 (240 Mg) MG tablet Take 1 tablet (400 mg total) by mouth daily. Patient not taking: Reported on 06/17/2021 04/27/21   Owens Shark, NP  methocarbamol (ROBAXIN) 750 MG tablet TAKE 1 TABLET 3  TIMES A DAY BY ORAL ROUTE AS NEEDED FOR 30 DAYS. Patient not taking: Reported on 06/17/2021 01/06/21 01/06/22  Everardo Beals, NP  ondansetron (ZOFRAN) 4 MG tablet Take 1 tablet (4 mg total) by mouth every 6 (six) hours as needed for nausea. Patient not taking: No sig reported 04/14/21   Owens Shark, NP  pantoprazole (PROTONIX) 40 MG tablet Take 1 tablet (40 mg total) by mouth 2 (two) times daily. Patient not taking: Reported on 06/17/2021 02/28/21   Allie Bossier, MD  polyethylene glycol (MIRALAX / GLYCOLAX) 17 g packet Take 17 g by mouth daily as needed for moderate constipation. Patient not taking: No sig reported 02/28/21   Allie Bossier, MD  rosuvastatin (CRESTOR) 5 MG tablet TAKE 1 TABLET BY MOUTH ONCE DAILY Patient not taking: No sig reported 01/06/21 01/06/22  Everardo Beals, NP  topiramate (TOPAMAX) 50 MG tablet Take 1 tablet (50 mg total) by mouth 2 (two) times daily. Patient not taking: No sig reported 11/19/19   Charlott Rakes, MD  fluticasone (FLONASE) 50 MCG/ACT nasal spray Place 1 spray into both nostrils daily. Patient not taking: Reported on 03/21/2019 12/29/17 06/29/19  Petrucelli, Glynda Jaeger, PA-C     Vital Signs: BP Marland Kitchen)  186/100 (BP Location: Left Arm)   Pulse 81   Temp 98.8 F (37.1 C) (Oral)   Resp 14   Ht 5\' 4"  (1.626 m)   Wt 225 lb (102.1 kg)   SpO2 99%   BMI 38.62 kg/m   Physical Exam awake, alert.  Chest clear to auscultation bilaterally.  Clean, intact right chest wall Port-A-Cath.  Heart with regular rate and rhythm.  Abdomen obese, soft, positive bowel sounds, mild epigastric tenderness to palpation.  No significant lower extremity edema.  Imaging: No results found.  Labs:  CBC: Recent Labs    06/21/21 0652 06/22/21 0458 06/23/21 0500 06/24/21 0557  WBC 4.8 5.5 4.6 4.8  HGB 9.9* 9.8* 9.5* 9.8*  HCT 30.2* 31.0* 29.3* 30.0*  PLT 126* 134* 137* 150    COAGS: Recent Labs    02/20/21 2006  INR 0.9    BMP: Recent Labs    07/21/20 1857  02/16/21 1224 06/22/21 0458 06/23/21 0500 06/24/21 0557 06/25/21 1055  NA 139   < > 142 141 139 137  K 3.7   < > 3.4* 3.3* 3.9 3.7  CL 108   < > 110 110 109 106  CO2 22   < > 24 24 23  21*  GLUCOSE 105*   < > 102* 92 132* 121*  BUN 14   < > 7 <5* <5* 9  CALCIUM 9.3   < > 8.3* 8.2* 8.4* 8.6*  CREATININE 0.76   < > 1.15* 0.97 0.94 1.11*  GFRNONAA >60   < > 57* >60 >60 59*  GFRAA >60  --   --   --   --   --    < > = values in this interval not displayed.    LIVER FUNCTION TESTS: Recent Labs    05/26/21 0858 06/17/21 0820 06/17/21 2200 06/18/21 0453 06/19/21 0526 06/22/21 0458 06/23/21 0500 06/24/21 0557 06/25/21 1055  BILITOT 0.5 0.7  --  0.8  --   --   --   --  0.7  AST 28 23  --  20  --   --   --   --  47*  ALT 18 12  --  12  --   --   --   --  16  ALKPHOS 122 98  --  74  --   --   --   --  80  PROT 6.7 6.1*  --  5.7*  --   --   --   --  5.7*  ALBUMIN 3.7 3.5   < > 3.0*   < > 2.7* 2.7* 2.8* 3.1*   < > = values in this interval not displayed.    Assessment and Plan: Patient familiar to IR service from liver lesion biopsy and Port-A-Cath placement on 02/24/2021.  She has a history of pancreatic cancer, iron deficiency anemia, protein calorie malnutrition, hypertension, prior CVA, hyperlipidemia, hypothyroidism, morbid obesity, asthma, migraines.  She presents now with abdominal pain along with intractable nausea and vomiting, likely related to pancreas/duodenal tumor.  She has no evidence of gastric outlet obstruction or bowel obstruction on recent imaging.  EGD showed no acute changes.  Esophagus appeared normal with severe gastritis in the prepyloric area and small 1 cm nonobstructing mass in the second portion of the duodenum.  Multiple medical interventions have not helped her nausea and vomiting. She  is not able to tolerate clears.  Request now received from primary team as well as oncology for [place] feeding tube  placement.  Case has been reviewed by Dr. Laurence Ferrari. Latest CT  shows no changes from prior CT on 7/6.  Details/risks of GJ tube placement, including but not limited to, internal bleeding, infection, injury to adjacent structures, failure to relieve nausea and vomiting d/w pt with her understanding and consent.  Tentative plans are to proceed with case on 7/18.  She is afebrile, WBC normal, hemoglobin 9.8, platelets normal ,creatinine 1.11.  Urine culture from 7/7 with E. Coli-completed antibiotics; COVID 19 negative.   Electronically Signed: D. Rowe Robert, PA-C 06/26/2021, 4:24 PM   I spent a total of 30 minutes at the the patient's bedside AND on the patient's hospital floor or unit, greater than 50% of which was counseling/coordinating care for percutaneous gastrojejunostomy tube placement    Patient ID: Wendy Hoffman, female   DOB: March 27, 1966, 55 y.o.   MRN: [id number]

## 2021-06-26 NOTE — Progress Notes (Addendum)
[facility] Progress Note  Wendy Hoffman 55 y.o. 09/20/1966  CC:  Intractable nausea/vomiting  Subjective: Patient reports improvement in nausea with current regimen but states she still is unable to tolerate clears. Denies diarrhea/constipation. Denies current abdominal pain.  ROS : Review of Systems  Cardiovascular:  Negative for chest pain and palpitations.  Gastrointestinal:  Positive for nausea and vomiting. Negative for abdominal pain, blood in stool, constipation, diarrhea, heartburn and melena.     Objective: Vital signs in last 24 hours: Vitals:   06/26/21 0605 06/26/21 0756  BP: (!) 186/100   Pulse: 81   Resp: 14   Temp: 98.8 F (37.1 C)   SpO2: 100% 99%    Physical Exam:  General:  Lethargic, cooperative, no distress  Head:  Normocephalic, without obvious abnormality, atraumatic  Eyes:  Anicteric sclera, EOMs intact  Lungs:   Clear to auscultation bilaterally, respirations unlabored  Heart:  Regular rate and rhythm, S1, S2 normal  Abdomen:   Soft with mild diffuse tenderness, bowel sounds active all four quadrants  Extremities: Extremities normal, atraumatic, no  edema    Lab Results: Recent Labs    06/24/21 0557 06/25/21 1055  NA 139 137  K 3.9 3.7  CL 109 106  CO2 23 21*  GLUCOSE 132* 121*  BUN <5* 9  CREATININE 0.94 1.11*  CALCIUM 8.4* 8.6*  MG 1.6*  --   PHOS 2.7  --     Recent Labs    06/24/21 0557 06/25/21 1055  AST  --  47*  ALT  --  16  ALKPHOS  --  80  BILITOT  --  0.7  PROT  --  5.7*  ALBUMIN 2.8* 3.1*    Recent Labs    06/24/21 0557  WBC 4.8  HGB 9.8*  HCT 30.0*  MCV 90.4  PLT 150    No results for input(s): LABPROT, INR in the last 72 hours.    Assessment: Intractable nausea/vomiting in the setting of metastatic pancreatic/duodenal adenocarcinoma -EGD 7/8 revealed normal-appearing esophagus, severe gastritis in the prepyloric area (bx negative for H pylori) and small around 1 cm  nonobstructive mass  in the second portion of the duodenum (bx: duodenal adenocarcinoma)  Plan: Continue IV erythromycin (500 mg IV q8h).  Repeat CT with IV contrast.  Continue Protonix IV BID and Carafate slurry QID.  Narcotic use precludes gastric emptying scan.  Continue supportive care.  [facility] will follow.  Salley Slaughter PA-C 06/26/2021, 9:59 AM  Contact #  [phone]

## 2021-06-26 NOTE — Progress Notes (Signed)
PROGRESS NOTE    Wendy Hoffman  GLO:[id number] DOB: 12-01-1966 DOA: 06/17/2021 PCP: Patient, No Pcp Per (Inactive)    Brief Narrative: 55 y.o. female with medical history significant of pancreatic CA, HTN, HLD. Presenting with intractable N/V. She reports that she's had several days of N/V. It's worse with eating. Both solids and liquids come back up within 30 minutes. He home nausea meds do not help. She went to clinic for her normal follow up and chemo session. She started vomiting and could not stop. There was no blood in her vomit. Labs were drawn. She was found to be in AKI. It was recommended that she come to the ED for help. She denies any other aggravating or alleviating factors.    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  Patient reports she vomited this morning.  She is on clear liquids.  This is thought to be secondary to pancreatic cancer with partially obstructing infiltrating duodenal mass/duodenal adenocarcinoma per EGD in March 2022. She has been treated for the oral thrush. CT abdomen and pelvis without contrast  shows no evidence of bowel obstruction. GI recommending CT abdomen and pelvis with contrast prior to [place] tube placement. Continue ivf   #2 e coli uti pansensitive sensitive finished 3 days of IV antibiotics.  #3 odynophagia due to gastritis status post EGD normal esophagus severe gastritis in the prepyloric area nonobstructive mass in the duodenum biopsy showing  adenocarcinoma, biopsy of the stomach with no H. Pylori Continue IV PPI.  #4 AKI creatinine on admission was 3.25 improving with IV fluids.  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      Nutrition Problem: Moderate Malnutrition Etiology: chronic illness, cancer and cancer related treatments     Signs/Symptoms: percent weight loss, energy intake < or equal to 75% for > or equal to 1 month, mild muscle depletion    Interventions: Refer to RD note for recommendations  Estimated body mass index is 38.62 kg/m as calculated from the following:   Height as of this encounter: 5\' 4"  (1.626 m).   Weight as of this encounter: 102.1 kg.  DVT prophylaxis:scd Code Status: Full Family Communication: None at bedside Disposition Plan:  Status is: Inpatient  Remains inpatient appropriate because:IV treatments appropriate due to intensity of illness or inability to take PO  Dispo: The patient is from: Home              Anticipated d/c is to: Home              Patient currently is not medically stable to d/c.   Difficult to place patient No    Consultants: GI and oncology  Procedures: Upper endoscopy 06/19/2021 Antimicrobials: Status post Levaquin and Rocephin and Diflucan Subjective:  Patient continues with nausea and vomiting unable to keep even clear liquids down  She did not respond to Zofran, Phenergan, Reglan, Ativan, scopolamine patch PPI and sucralfate She  does not want to enroll in hospice She would like to have a PEG tube placed  Objective: Vitals:   06/25/21 1100 06/25/21 2120 06/26/21 0605 06/26/21 0756  BP:  (!) 186/110 (!) 186/100   Pulse:  87 81   Resp:  16 14   Temp:  98.7 F (37.1 C) 98.8 F (37.1 C)   TempSrc:  Oral Oral   SpO2:  100% 100% 99%  Weight: 102.1 kg     Height:        Intake/Output Summary (Last 24 hours) at 06/26/2021 1237 Last data  filed at 06/26/2021 0602 Gross per 24 hour  Intake 2060.3 ml  Output --  Net 2060.3 ml    Filed Weights   06/25/21 1100  Weight: 102.1 kg    Examination:  General exam: Appears calm and comfortable  Respiratory system: Clear to auscultation. Respiratory effort normal. Cardiovascular system: S1 & S2 heard, RRR. No JVD, murmurs, rubs, gallops or clicks. No pedal edema. Gastrointestinal system: Abdomen is nondistended, soft and nontender. No organomegaly or masses felt. Normal bowel sounds heard. Central nervous system: Alert and oriented. No focal neurological deficits. Extremities: Symmetric 5 x 5 power. Skin: No rashes, lesions or ulcers Psychiatry: Judgement and insight appear normal. Mood & affect appropriate.     Data Reviewed: I have personally reviewed following labs and imaging studies  CBC: Recent Labs  Lab 06/20/21 0527 06/21/21 0652 06/22/21 0458 06/23/21 0500 06/24/21 0557  WBC 4.7 4.8 5.5 4.6 4.8  NEUTROABS  --  3.0 3.8  --   --   HGB 10.1* 9.9* 9.8* 9.5* 9.8*  HCT 31.8* 30.2* 31.0* 29.3* 30.0*  MCV 92.7 90.7 92.0 91.6 90.4  PLT 128* 126* 134* 137* 774    Basic Metabolic Panel: Recent Labs  Lab 06/20/21 0527 06/21/21 0652 06/22/21 0458 06/23/21 0500 06/24/21 0557 06/25/21 1055  NA 140 145 142 141 139 137  K 3.6 3.4* 3.4* 3.3* 3.9 3.7  CL 107 112* 110 110 109 106  CO2 25 26 24 24 23  21*  GLUCOSE 93 97 102* 92 132* 121*  BUN 16 10 7  <5* <5* 9  CREATININE 1.50* 1.26* 1.15* 0.97 0.94 1.11*  CALCIUM 8.4* 8.4* 8.3* 8.2* 8.4* 8.6*  MG 1.7 2.0 1.6* 1.8 1.6*  --   PHOS 2.3* 2.7 3.1 2.5 2.7  --     GFR: Estimated Creatinine Clearance: 68.2 mL/min (A) (by C-G formula based on SCr of 1.11 mg/dL (H)). Liver Function Tests: Recent Labs  Lab 06/21/21 1287 06/22/21 0458 06/23/21 0500 06/24/21 0557 06/25/21 1055  AST  --   --   --   --  47*  ALT  --   --   --   --  16  ALKPHOS  --   --   --   --  80  BILITOT  --   --   --   --  0.7  PROT  --   --    --   --  5.7*  ALBUMIN 2.5* 2.7* 2.7* 2.8* 3.1*    No results for input(s): LIPASE, AMYLASE in the last 168 hours. No results for input(s): AMMONIA in the last 168 hours. Coagulation Profile: No results for input(s): INR, PROTIME in the last 168 hours. Cardiac Enzymes: No results for input(s): CKTOTAL, CKMB, CKMBINDEX, TROPONINI in the last 168 hours. BNP (last 3 results) No results for input(s): PROBNP in the last 8760 hours. HbA1C: No results for input(s): HGBA1C in the last 72 hours.  CBG: Recent Labs  Lab 06/23/21 0856 06/23/21 2151 06/24/21 0021  GLUCAP 91 125* 124*    Lipid Profile: No results for input(s): CHOL, HDL, LDLCALC, TRIG, CHOLHDL, LDLDIRECT in the last 72 hours. Thyroid Function Tests: No results for input(s): TSH, T4TOTAL, FREET4, T3FREE, THYROIDAB in the last 72 hours. Anemia Panel: No results for input(s): VITAMINB12, FOLATE, FERRITIN, TIBC, IRON, RETICCTPCT in the last 72 hours. Sepsis Labs: No results for input(s): PROCALCITON, LATICACIDVEN in the last 168 hours.  Recent Results (from the past 240 hour(s))  SARS CORONAVIRUS 2 (TAT 6-24 HRS) Nasopharyngeal Nasopharyngeal Swab     Status: None   Collection Time: 06/17/21  4:20 PM   Specimen: Nasopharyngeal Swab  Result Value Ref Range Status   SARS Coronavirus 2 NEGATIVE NEGATIVE Final    Comment: (NOTE) SARS-CoV-2 target nucleic acids are NOT DETECTED.  The SARS-CoV-2 RNA is generally detectable in upper and lower respiratory specimens during the acute phase of infection. Negative results do not preclude SARS-CoV-2 infection, do not rule out co-infections with other pathogens, and should not be used as the sole basis for treatment or other patient management decisions. Negative results must be combined with clinical observations, patient history, and epidemiological information. The expected result is Negative.  Fact Sheet for Patients: SugarRoll.be  Fact Sheet  for Healthcare Providers: https://www.woods-.com/  This test is not yet approved or cleared by the [place] FDA and  has been authorized for detection and/or diagnosis of SARS-CoV-2 by FDA under an Emergency Use Authorization (EUA). This EUA will remain  in effect (meaning this test can be used) for the duration of the COVID-19 declaration under Se ction 564(b)(1) of the Act, 21 U.S.C. section 360bbb-3(b)(1), unless the authorization is terminated or revoked sooner.  Performed at [facility], [place] [address], [place], [place] [address]   Culture, Urine     Status: Abnormal   Collection Time: 06/18/21 10:00 AM   Specimen: Urine, Catheterized  Result Value Ref Range Status   Specimen Description   Final    URINE, CATHETERIZED Performed at [facility] [address] [place], [place] [address]    Special Requests   Final    NONE Performed at [facility], [place] [address], [place], [place] [address]    Culture >=100,000 COLONIES/mL ESCHERICHIA COLI (A)  Final   Report Status 06/20/2021 FINAL  Final   Organism ID, Bacteria ESCHERICHIA COLI (A)  Final      Susceptibility   Escherichia coli - MIC*    AMPICILLIN 8 SENSITIVE Sensitive     CEFAZOLIN <=4 SENSITIVE Sensitive     CEFEPIME <=0.12 SENSITIVE Sensitive     CEFTRIAXONE <=0.25 SENSITIVE Sensitive     CIPROFLOXACIN <=0.25 SENSITIVE Sensitive     GENTAMICIN <=1 SENSITIVE Sensitive     IMIPENEM <=0.25 SENSITIVE Sensitive     NITROFURANTOIN <=16 SENSITIVE Sensitive     TRIMETH/SULFA <=20 SENSITIVE Sensitive     AMPICILLIN/SULBACTAM <=2 SENSITIVE Sensitive     PIP/TAZO <=4 SENSITIVE Sensitive     * >=100,000 COLONIES/mL ESCHERICHIA COLI          Radiology Studies: No results found.      Scheduled Meds:  Chlorhexidine Gluconate Cloth  6 each Topical Daily   feeding supplement  1 Container Oral TID BM   heparin lock flush  500 Units Intracatheter  Q30 days   mometasone-formoterol  2 puff Inhalation BID   morphine  30 mg Oral Q12H   pantoprazole (  PROTONIX) IV  40 mg Intravenous Q12H   scopolamine  1 patch Transdermal Q72H   sodium chloride flush  10-40 mL Intracatheter Q12H   sucralfate  1 g Oral Q6H   Continuous Infusions:  dextrose 5 % and 0.9% NaCl Stopped (06/26/21 0524)   erythromycin 500 mg (06/26/21 1229)   promethazine (PHENERGAN) injection (IM or IVPB) 12.5 mg (06/24/21 1602)     LOS: 9 days    Time spent: 40 min    Georgette Shell, MD  06/26/2021, 12:37 PM

## 2021-06-26 NOTE — Progress Notes (Signed)
Patient has consistently declined inhaled steroid and states she uses her Symbicort on an as needed basis only at home during peak seasonal times. Dulera changed to prn

## 2021-06-26 NOTE — Progress Notes (Addendum)
HEMATOLOGY-ONCOLOGY PROGRESS NOTE  SUBJECTIVE: Wendy Hoffman continues to have abdominal discomfort, nausea, vomiting.  Abdominal pain is somewhat improved with MS Contin and as needed Dilaudid.  Has not been using oxycodone elixir.  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  The report date is March 23, 2021.   The Multi-Cancer Panel with pancreatitis genes and preliminary pancreatic cancer genes offered by [facility] includes sequencing and/or deletion duplication testing of the following 91 genes: AIP, ALK, APC, ATM, AXIN2,BAP1,  BARD1, BLM, BMPR1A, BRCA1, BRCA2, BRIP1, CASR, CDC73, CDH1, CDK4, CDKN1B, CDKN1C, CDKN2A (p14ARF), CDKN2A (p16INK4a), CEBPA, CFTR, CHEK2, CPA1, CTNNA1, CTRC, DICER1, DIS3L2, EGFR (c.2369C>T, p.Thr790Met variant only), EPCAM (Deletion/duplication testing only), FANCC, FH, FLCN, GATA2, GPC3, GREM1 (Promoter region deletion/duplication testing only), HOXB13 (c.251G>A, p.Gly84Glu), HRAS, KIT, MAX, MEN1, MET, MITF (c.952G>A, p.Glu318Lys variant only), MLH1, MSH2, MSH3, MSH6, MUTYH, NBN, NF1, NF2, NTHL1, PALB2, PALLD, PDGFRA, PHOX2B, PMS2, POLD1, POLE, POT1, PRKAR1A, PRSS1, PTCH1, PTEN, RAD50, RAD51C, RAD51D, RB1, RECQL4, RET, RNF43, RUNX1, SDHAF2, SDHA (sequence changes only), SDHB, SDHC, SDHD, SMAD4, SMARCA4, SMARCB1, SMARCE1, SPINK1, STK11, SUFU, TERC, TERT, TMEM127, TP53, TSC1, TSC2, VHL, WRN and WT1.     PHYSICAL  EXAMINATION:  Vitals:   06/26/21 0605 06/26/21 0756  BP: (!) 186/100   Pulse: 81   Resp: 14   Temp: 98.8 F (37.1 C)   SpO2: 100% 99%   Intake/Output from previous day: 07/14 0701 - 07/15 0700 In: 2060.3 [I.V.:1752.3; IV Piggyback:308] Out: -   OROPHARYNX: No thrush ABDOMEN: Mild tenderness in the mid upper abdomen NEURO: alert & oriented x 3 with fluent speech, no focal motor/sensory deficits Vascular: No leg edema  LABORATORY DATA:  I have reviewed the data as listed CMP Latest Ref Rng & Units 06/25/2021 06/24/2021 06/23/2021  Glucose 70 - 99 mg/dL 121(H) 132(H) 92  BUN 6 - 20 mg/dL 9 <5(L) <5(L)  Creatinine 0.44 - 1.00 mg/dL 1.11(H) 0.94 0.97  Sodium 135 - 145 mmol/L 137 139 141  Potassium 3.5 - 5.1 mmol/L 3.7 3.9 3.3(L)  Chloride 98 - 111 mmol/L 106 109 110  CO2 22 - 32 mmol/L 21(L) 23 24  Calcium 8.9 - 10.3 mg/dL 8.6(L) 8.4(L) 8.2(L)  Total Protein 6.5 - 8.1 g/dL 5.7(L) - -  Total Bilirubin 0.3 - 1.2 mg/dL 0.7 - -  Alkaline Phos 38 - 126 U/L 80 - -  AST 15 - 41 U/L 47(H) - -  ALT 0 - 44 U/L 16 - -    Lab Results  Component Value Date   WBC 4.8 06/24/2021   HGB 9.8 (L) 06/24/2021   HCT 30.0 (L) 06/24/2021   MCV 90.4 06/24/2021   PLT 150 06/24/2021   NEUTROABS 3.8 06/22/2021    CT ABDOMEN PELVIS WO CONTRAST  Result Date: 06/17/2021 CLINICAL DATA:  Rule out bowel obstruction. History of pancreas cancer. EXAM: CT ABDOMEN AND PELVIS WITHOUT CONTRAST TECHNIQUE: Multidetector CT imaging of the abdomen  and pelvis was performed following the standard protocol without IV contrast. COMPARISON:  05/23/2021 FINDINGS: Lower chest: No acute abnormality. Hepatobiliary: Within a background of diffuse hepatic steatosis there are multiple liver metastases as noted previously: The dominant lesion within the lateral segment of left hepatic lobe measures 7.8 cm, image 26/2. Previously this measured the same. The dominant lesion within the right hepatic lobe measures 8.2 cm, image 25/2.  Previously 8 cm. Status post cholecystectomy. No biliary dilatation. Pancreas: Head of pancreas mass is suboptimally evaluated due to lack of IV contrast material. Subjectively, this does not appear significantly changed in size when compared with the previous contrast enhanced MRI. Spleen: Normal in size without focal abnormality. Adrenals/Urinary Tract: Normal adrenal glands. No hydronephrosis identified bilaterally. Punctate stone noted within inferior pole of left kidney. Exophytic cyst is again noted arising off the posterior cortex of the interpolar right kidney measuring 1.3 cm. Urinary bladder appears collapsed. Stomach/Bowel: Stomach is nondistended. The appendix is visualized and appears normal. No pathologic dilatation of the large or small bowel loops. No significant bowel wall thickening or surrounding inflammatory fat stranding. Vascular/Lymphatic: Mild aortic atherosclerosis. No aneurysm. No abdominopelvic adenopathy identified. Reproductive: Status post hysterectomy. No adnexal masses.  The Other: No ascites or focal fluid collections. No peritoneal nodularity identified at this time. Musculoskeletal: No acute or significant osseous findings. IMPRESSION: 1. No acute findings within the abdomen or pelvis. No evidence for bowel obstruction. 2. Multiple liver metastases.  Similar to previous exam. 3. Head of pancreas mass is suboptimally evaluated due to lack of IV contrast material. Subjectively, this does not appear significantly changed in size when compared with the previous contrast enhanced MRI. 4. Aortic atherosclerosis. Aortic Atherosclerosis (ICD10-I70.0). Electronically Signed   By: Kerby Moors M.D.   On: 06/17/2021 19:07    ASSESSMENT AND PLAN: 1. Pancreas cancer -CT abdomen/pelvis without contrast 02/16/2021-multiple large hypoechoic masses in the patient's liver consistent metastatic disease, ill-defined masslike area in the pancreatic head measuring approximate 4.7 cm, possible  filling defect in the right ventricle. -EGD 02/19/2021-large infiltrative mass with bleeding in the second portion of the duodenum, appears to be arising near the ampulla, partially obstructive with friable mucosa.  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 -Flexible sigmoidoscopy 02/19/2021-diverticulosis in the sigmoid colon, nonbleeding external and internal hemorrhoids -Cardiac CT 02/20/2021-mass in the mid RV attached to the interventricular septum measuring 16 mm x 6 mm and concerning for metastatic tumor -MRI abdomen with/without contrast 02/21/2021-mass in the posterior pancreatic head measuring 4.3 x 3.8 cm with obstruction of the central pancreatic duct and diffuse dilatation up to 6 mm consistent with pancreatic adenocarcinoma, liver lesions the largest mass of the central left lobe measuring 9.9 x 9.2 cm consistent with hepatic metastatic disease,  hepatomegaly. -Ultrasound-guided biopsy of a left liver lesion 02/24/2021-adenocarcinoma, morphologically similar to the duodenal biopsy; microsatellite stable, tumor mutation burden 1 -Negative genetic testing -Palliative radiation to the duodenal mass 02/25/2021-03/06/2021 -Cycle 1 FOLFOX 03/17/2021 -Cycle 2 FOLFOX 04/01/2021 -Cycle 3 FOLFIRINOX 04/14/2021 -Cycle 4 FOLFIRINOX 04/27/2021, Udenyca added -Cycle 5 FOLFIRINOX 05/12/2021, Udenyca -MRI abdomen 05/23/2021-decrease in size of pancreas mass and hepatic lesions, no progressive disease -Cycle 6 FOLFIRINOX 05/27/2021 2.  Iron deficiency anemia-improved -Feraheme 510 mg 02/20/2021 3.  Protein calorie malnutrition 4.  Possible right ventricular filling defect on noncontrast CT scan MRI cardiac morphology 02/21/2021-mass in the mid RV attached to the interventricular septum measures 16 mm x 6 mm.  Mass appears heterogeneous with area of enhancement on LGE imaging.  Mass  is concerning for metastatic tumor.  Normal LV size and systolic function.  Normal RV size and systolic  function. 5.  Hypertension 6.  History of CVA 7.  Hyperlipidemia 8.  Hypothyroidism 9.  Morbid obesity 10.  Asthma 11.  Migraines 12.  Pain secondary to #1 13.  Port-A-Cath placement 02/25/2021 14.  Hypokalemia 04/01/2021-started a potassium supplement 15.  Hospital admission 06/17/2021-intractable nausea and vomiting EGD 06/19/2021-diffuse gastritis, nonobstructing duodenal mass, biopsy of stomach-no malignancy or H. pylori, duodenal mass-adenocarcinoma  Ms. Sardo continues to have intractable nausea and vomiting.  I suspect her symptoms are related to the pancreas/duodenal tumor.  She does not appear to have a small bowel obstruction.  She could have gastroparesis.  She has been trialed on several antiemetics including Zofran, Compazine, and Phenergan.  Metoclopramide was ordered yesterday but not started due to concern for "allergy"-nausea and vomiting.  She was also started on dexamethasone 4 mg twice daily.  All these have been ineffective in controlling her nausea and vomiting.  She also remains on PPI and Carafate.  She was reevaluated by GI and was started on IV erythromycin and they plan for a repeat CT of the abdomen/pelvis.  Discussed with the patient she is unable to maintain her nutrition and the symptoms are related to her underlying malignancy.  We discussed consideration of GJ feeding tube placement and she is interested in proceeding with this.  This can be done by IR.  We also discussed consideration of second line chemotherapy versus referral to hospice.  The patient is interested in second line therapy and does not want to enroll in hospice at this time.  The pain is under better control with resuming MS Contin.  She has oxycodone elixir available to her for breakthrough pain.  She also has IV hydromorphone available to her as well.  Recommendations: 1.  Continue antacid regimen. 2.  Continue IV erythromycin per GI. 3.  We will follow-up on repeat CT abdomen/pelvis. 4.   IR referral for consideration of GJ feeding tube. 5.  Continue MS Contin and begin oxycodone for breakthrough pain   LOS: 9 days   Mikey Bussing 06/26/21 Ms. Degraffenreid was interviewed and examined.  She has persistent nausea and vomiting.  Multiple medical interventions have not helped.  I suspect her symptoms are related to the pancreas tumor.  We discussed the indication for a venting gastrostomy and feeding tube.  She agrees to proceed with placement of a G-J-tube for venting and feeding.  She understands placement of the tube will not relieve her pain or nausea. She understands the pancreas cancer is not curable.  There is been no clear evidence of disease progression while on FOLFIRINOX, but her symptoms are not improving.  We discussed switching to a different systemic therapy regimen.  She understands a small chance of clinical improvement with second line chemotherapy.  We discussed hospice care.  She does not wish to consider hospice at present.  She would like to proceed with second line systemic therapy.  I will check on her over the weekend.  I was present for greater than 50% of today's visit.  I performed medical decision making.  Julieanne Manson, MD

## 2021-06-27 ENCOUNTER — Other Ambulatory Visit: Payer: Self-pay | Admitting: Oncology

## 2021-06-27 DIAGNOSIS — R112 Nausea with vomiting, unspecified: Secondary | ICD-10-CM | POA: Diagnosis not present

## 2021-06-27 DIAGNOSIS — C250 Malignant neoplasm of head of pancreas: Secondary | ICD-10-CM | POA: Diagnosis not present

## 2021-06-27 LAB — COMPREHENSIVE METABOLIC PANEL
ALT: 22 U/L (ref 0–44)
AST: 59 U/L — ABNORMAL HIGH (ref 15–41)
Albumin: 3 g/dL — ABNORMAL LOW (ref 3.5–5.0)
Alkaline Phosphatase: 80 U/L (ref 38–126)
Anion gap: 6 (ref 5–15)
BUN: 9 mg/dL (ref 6–20)
CO2: 26 mmol/L (ref 22–32)
Calcium: 8.2 mg/dL — ABNORMAL LOW (ref 8.9–10.3)
Chloride: 108 mmol/L (ref 98–111)
Creatinine, Ser: 1.01 mg/dL — ABNORMAL HIGH (ref 0.44–1.00)
GFR, Estimated: >60 mL/min (ref >60)
Glucose, Bld: 99 mg/dL (ref 70–99)
Potassium: 2.9 mmol/L — ABNORMAL LOW (ref 3.5–5.1)
Sodium: 140 mmol/L (ref 135–145)
Total Bilirubin: 0.6 mg/dL (ref 0.3–1.2)
Total Protein: 5.2 g/dL — ABNORMAL LOW (ref 6.5–8.1)

## 2021-06-27 LAB — CBC
HCT: 27.9 % — ABNORMAL LOW (ref 36.0–46.0)
Hemoglobin: 8.9 g/dL — ABNORMAL LOW (ref 12.0–15.0)
MCH: 29.2 pg (ref 26.0–34.0)
MCHC: 31.9 g/dL (ref 30.0–36.0)
MCV: 91.5 fL (ref 80.0–100.0)
Platelets: 151 10*3/uL (ref 150–400)
RBC: 3.05 MIL/uL — ABNORMAL LOW (ref 3.87–5.11)
RDW: 16.9 % — ABNORMAL HIGH (ref 11.5–15.5)
WBC: 5 10*3/uL (ref 4.0–10.5)
nRBC: 0 % (ref 0.0–0.2)

## 2021-06-27 LAB — PROTIME-INR
INR: 1.2 (ref 0.8–1.2)
Prothrombin Time: 15.2 seconds (ref 11.4–15.2)

## 2021-06-27 MED ORDER — HYDRALAZINE HCL 20 MG/ML IJ SOLN
5.0000 mg | Freq: Four times a day (QID) | INTRAMUSCULAR | Status: DC | PRN
  Administered 2021-06-27 – 2021-06-30 (×4): 5 mg via INTRAVENOUS
  Filled 2021-06-27 (×5): qty 1

## 2021-06-27 MED ORDER — PHENOL 1.4 % MT LIQD
1.0000 | OROMUCOSAL | Status: DC | PRN
  Administered 2021-06-27: 1 via OROMUCOSAL
  Filled 2021-06-27: qty 177

## 2021-06-27 MED ORDER — SALINE SPRAY 0.65 % NA SOLN
1.0000 | NASAL | Status: DC | PRN
  Administered 2021-06-27: 1 via NASAL
  Filled 2021-06-27: qty 44

## 2021-06-27 MED ORDER — POTASSIUM CHLORIDE 10 MEQ/100ML IV SOLN
10.0000 meq | INTRAVENOUS | Status: AC
  Administered 2021-06-27 (×3): 10 meq via INTRAVENOUS
  Filled 2021-06-27 (×3): qty 100

## 2021-06-27 MED ORDER — AMLODIPINE BESYLATE 10 MG PO TABS
10.0000 mg | ORAL_TABLET | Freq: Every day | ORAL | Status: DC
  Administered 2021-06-27 – 2021-07-07 (×9): 10 mg via ORAL
  Filled 2021-06-27 (×10): qty 1

## 2021-06-27 MED ORDER — POTASSIUM CHLORIDE 10 MEQ/100ML IV SOLN
10.0000 meq | Freq: Once | INTRAVENOUS | Status: AC
  Administered 2021-06-27: 10 meq via INTRAVENOUS
  Filled 2021-06-27: qty 100

## 2021-06-27 NOTE — Progress Notes (Signed)
HEMATOLOGY-ONCOLOGY PROGRESS NOTE  SUBJECTIVE: Wendy Hoffman reports improvement in nausea.  She has not vomited since yesterday afternoon.  She would like to advance her diet.  Pain is controlled with the current narcotic regimen. 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  The report date is March 23, 2021.   The Multi-Cancer Panel with pancreatitis genes and preliminary pancreatic cancer genes offered by [facility] includes sequencing and/or deletion duplication testing of the following 91 genes: AIP, ALK, APC, ATM, AXIN2,BAP1,  BARD1, BLM, BMPR1A, BRCA1, BRCA2, BRIP1, CASR, CDC73, CDH1, CDK4, CDKN1B, CDKN1C, CDKN2A (p14ARF), CDKN2A (p16INK4a), CEBPA, CFTR, CHEK2, CPA1, CTNNA1, CTRC, DICER1, DIS3L2, EGFR (c.2369C>T, p.Thr790Met variant only), EPCAM (Deletion/duplication testing only), FANCC, FH, FLCN, GATA2, GPC3, GREM1 (Promoter region deletion/duplication testing only), HOXB13 (c.251G>A, p.Gly84Glu), HRAS, KIT, MAX, MEN1, MET, MITF (c.952G>A, p.Glu318Lys variant only), MLH1, MSH2, MSH3, MSH6, MUTYH, NBN, NF1, NF2, NTHL1, PALB2, PALLD, PDGFRA, PHOX2B, PMS2, POLD1, POLE, POT1, PRKAR1A, PRSS1, PTCH1, PTEN, RAD50, RAD51C, RAD51D, RB1, RECQL4, RET, RNF43, RUNX1, SDHAF2, SDHA (sequence changes only), SDHB, SDHC, SDHD, SMAD4, SMARCA4, SMARCB1, SMARCE1, SPINK1, STK11, SUFU, TERC, TERT, TMEM127, TP53, TSC1, TSC2, VHL, WRN and WT1.     PHYSICAL  EXAMINATION:  Vitals:   06/27/21 0225 06/27/21 0455  BP: (!) 152/103 (!) 157/89  Pulse: 68 (!) 56  Resp: 16 18  Temp:  98.5 F (36.9 C)  SpO2:  100%   Intake/Output from previous day: 07/15 0701 - 07/16 0700 In: 720 [P.O.:720] Out: -   OROPHARYNX: No thrush ABDOMEN: Tender in the right and mid upper abdomen NEURO: alert & oriented x 3 with fluent speech, no focal motor/sensory deficits Vascular: No leg edema  LABORATORY DATA:  I have reviewed the data as listed CMP Latest Ref Rng & Units 06/27/2021 06/25/2021 06/24/2021  Glucose 70 - 99 mg/dL 99 121(H) 132(H)  BUN 6 - 20 mg/dL 9 9 <5(L)  Creatinine 0.44 - 1.00 mg/dL 1.01(H) 1.11(H) 0.94  Sodium 135 - 145 mmol/L 140 137 139  Potassium 3.5 - 5.1 mmol/L 2.9(L) 3.7 3.9  Chloride 98 - 111 mmol/L 108 106 109  CO2 22 - 32 mmol/L 26 21(L) 23  Calcium 8.9 - 10.3 mg/dL 8.2(L) 8.6(L) 8.4(L)  Total Protein 6.5 - 8.1 g/dL 5.2(L) 5.7(L) -  Total Bilirubin 0.3 - 1.2 mg/dL 0.6 0.7 -  Alkaline Phos 38 - 126 U/L 80 80 -  AST 15 - 41 U/L 59(H) 47(H) -  ALT 0 - 44 U/L 22 16 -    Lab Results  Component Value Date   WBC 5.0 06/27/2021   HGB 8.9 (L) 06/27/2021   HCT 27.9 (L) 06/27/2021   MCV 91.5 06/27/2021   PLT 151 06/27/2021   NEUTROABS 3.8 06/22/2021    CT ABDOMEN PELVIS WO CONTRAST  Result Date: 06/17/2021 CLINICAL DATA:  Rule out bowel obstruction. History of pancreas cancer. EXAM: CT ABDOMEN AND PELVIS WITHOUT CONTRAST TECHNIQUE: Multidetector CT imaging of  the abdomen and pelvis was performed following the standard protocol without IV contrast. COMPARISON:  05/23/2021 FINDINGS: Lower chest: No acute abnormality. Hepatobiliary: Within a background of diffuse hepatic steatosis there are multiple liver metastases as noted previously: The dominant lesion within the lateral segment of left hepatic lobe measures 7.8 cm, image 26/2. Previously this measured the same. The dominant lesion within the right hepatic lobe measures 8.2 cm, image  25/2. Previously 8 cm. Status post cholecystectomy. No biliary dilatation. Pancreas: Head of pancreas mass is suboptimally evaluated due to lack of IV contrast material. Subjectively, this does not appear significantly changed in size when compared with the previous contrast enhanced MRI. Spleen: Normal in size without focal abnormality. Adrenals/Urinary Tract: Normal adrenal glands. No hydronephrosis identified bilaterally. Punctate stone noted within inferior pole of left kidney. Exophytic cyst is again noted arising off the posterior cortex of the interpolar right kidney measuring 1.3 cm. Urinary bladder appears collapsed. Stomach/Bowel: Stomach is nondistended. The appendix is visualized and appears normal. No pathologic dilatation of the large or small bowel loops. No significant bowel wall thickening or surrounding inflammatory fat stranding. Vascular/Lymphatic: Mild aortic atherosclerosis. No aneurysm. No abdominopelvic adenopathy identified. Reproductive: Status post hysterectomy. No adnexal masses.  The Other: No ascites or focal fluid collections. No peritoneal nodularity identified at this time. Musculoskeletal: No acute or significant osseous findings. IMPRESSION: 1. No acute findings within the abdomen or pelvis. No evidence for bowel obstruction. 2. Multiple liver metastases.  Similar to previous exam. 3. Head of pancreas mass is suboptimally evaluated due to lack of IV contrast material. Subjectively, this does not appear significantly changed in size when compared with the previous contrast enhanced MRI. 4. Aortic atherosclerosis. Aortic Atherosclerosis (ICD10-I70.0). Electronically Signed   By: Kerby Moors M.D.   On: 06/17/2021 19:07   CT ABDOMEN PELVIS W CONTRAST  Result Date: 06/26/2021 CLINICAL DATA:  Worsening nausea and vomiting over the last week, pancreatic cancer undergoing chemotherapy EXAM: CT ABDOMEN AND PELVIS WITH CONTRAST TECHNIQUE: Multidetector CT imaging of the abdomen and  pelvis was performed using the standard protocol following bolus administration of intravenous contrast. CONTRAST:  59m OMNIPAQUE IOHEXOL 350 MG/ML SOLN COMPARISON:  06/17/2021, 05/23/2021, 02/16/2021 FINDINGS: Lower chest: No acute pleural or parenchymal lung disease. Central venous catheter tip within the right atrium near the junction with the IVC. No pericardial effusion. Hepatobiliary: Multiple hepatic masses are again identified. Largest lesion within the left lobe liver reference image 27/2 measures 8.0 x 6.1 cm, previously measuring 7.6 x 6.0 cm on MRI 05/23/2021. Largest lesion within the right lobe liver measures up to 8.2 x 4.5 cm on image 23/2, previously measuring 7.6 x 3.7 cm on MRI. Smaller lesions throughout the left lobe liver more inferiorly are grossly unchanged, though less well visualized on CT compared to MRI. No new lesions are definitively identified. No intrahepatic biliary duct dilation. Gallbladder is surgically absent. Pancreas: The ill-defined mass within the pancreatic head measures up to approximately 2.8 x 3.1 cm reference image 65/2, previously having measured 2.9 x 2.9 cm on MRI 05/23/2021. Mild pancreatic duct dilation and upstream pancreatic parenchymal atrophy again noted unchanged. Spleen: No focal splenic abnormalities. Mild splenomegaly unchanged. Adrenals/Urinary Tract: No urinary tract calculi or obstructive uropathy within either kidney. The kidneys enhance normally and symmetrically. Small left renal cortical cysts unchanged. The adrenals are unremarkable. Bladder is only minimally distended with no gross abnormalities. Stomach/Bowel: No bowel obstruction or ileus. Normal retrocecal appendix. No bowel wall thickening or inflammatory change. Vascular/Lymphatic: Mild atherosclerosis  of the aorta and iliac vessels unchanged. No discrete adenopathy within the abdomen or pelvis. Reproductive: Status post hysterectomy. No adnexal masses. Other: Trace pelvic free fluid. No free  intraperitoneal gas. No abdominal wall hernia. Musculoskeletal: No acute or destructive bony lesions. Reconstructed images demonstrate no additional findings. IMPRESSION: 1. Grossly stable mass within the pancreatic head compatible with known pancreatic cancer. 2. No significant change in the size and number of known liver metastases, allowing for differences in technique compared to previous MRI. 3. Trace pelvic free fluid. 4.  Aortic Atherosclerosis (ICD10-I70.0). Electronically Signed   By: Randa Ngo M.D.   On: 06/26/2021 17:01    ASSESSMENT AND PLAN: 1. Pancreas cancer -CT abdomen/pelvis without contrast 02/16/2021-multiple large hypoechoic masses in the patient's liver consistent metastatic disease, ill-defined masslike area in the pancreatic head measuring approximate 4.7 cm, possible filling defect in the right ventricle. -EGD 02/19/2021-large infiltrative mass with bleeding in the second portion of the duodenum, appears to be arising near the ampulla, partially obstructive with friable mucosa.  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 -Flexible sigmoidoscopy 02/19/2021-diverticulosis in the sigmoid colon, nonbleeding external and internal hemorrhoids -Cardiac CT 02/20/2021-mass in the mid RV attached to the interventricular septum measuring 16 mm x 6 mm and concerning for metastatic tumor -MRI abdomen with/without contrast 02/21/2021-mass in the posterior pancreatic head measuring 4.3 x 3.8 cm with obstruction of the central pancreatic duct and diffuse dilatation up to 6 mm consistent with pancreatic adenocarcinoma, liver lesions the largest mass of the central left lobe measuring 9.9 x 9.2 cm consistent with hepatic metastatic disease,  hepatomegaly. -Ultrasound-guided biopsy of a left liver lesion 02/24/2021-adenocarcinoma, morphologically similar to the duodenal biopsy; microsatellite stable, tumor mutation burden 1 -Negative genetic testing -Palliative radiation to  the duodenal mass 02/25/2021-03/06/2021 -Cycle 1 FOLFOX 03/17/2021 -Cycle 2 FOLFOX 04/01/2021 -Cycle 3 FOLFIRINOX 04/14/2021 -Cycle 4 FOLFIRINOX 04/27/2021, Udenyca added -Cycle 5 FOLFIRINOX 05/12/2021, Udenyca -MRI abdomen 05/23/2021-decrease in size of pancreas mass and hepatic lesions, no progressive disease -Cycle 6 FOLFIRINOX 05/27/2021 2.  Iron deficiency anemia-improved -Feraheme 510 mg 02/20/2021 3.  Protein calorie malnutrition 4.  Possible right ventricular filling defect on noncontrast CT scan MRI cardiac morphology 02/21/2021-mass in the mid RV attached to the interventricular septum measures 16 mm x 6 mm.  Mass appears heterogeneous with area of enhancement on LGE imaging.  Mass is concerning for metastatic tumor.  Normal LV size and systolic function.  Normal RV size and systolic function. 5.  Hypertension 6.  History of CVA 7.  Hyperlipidemia 8.  Hypothyroidism 9.  Morbid obesity 10.  Asthma 11.  Migraines 12.  Pain secondary to #1 13.  Port-A-Cath placement 02/25/2021 14.  Hypokalemia 04/01/2021-started a potassium supplement 15.  Hospital admission 06/17/2021-intractable nausea and vomiting EGD 06/19/2021-diffuse gastritis, nonobstructing duodenal mass, biopsy of stomach-no malignancy or H. pylori, duodenal mass-adenocarcinoma  Wendy Hoffman reports improvement in the nausea today.  She attributes this to the scopolamine patch.  We will advance her diet as tolerated.  She is scheduled for G-J-tube placement on 06/29/2021.  She will continue the current narcotic regimen.  I reviewed the abdominal CT images from yesterday.  She appears to have significant liver metastases.  The nausea could be from the liver metastases, the pancreas tumor, or unrecognized carcinomatosis.  We will consider changing systemic therapy to gemcitabine/Abraxane after a feeding tube is placed.  Recommendations: 1.  Continue antacid regimen. 2.  Continue IV erythromycin per GI. 3.  Advance diet as tolerated 4.   I  proceed with feeding tube on 06/29/2021 5.  Continue MS Contin with Dilaudid and oxycodone as needed for breakthrough pain   LOS: 10 days   Betsy Coder 06/27/21   Julieanne Manson, MD

## 2021-06-27 NOTE — Progress Notes (Signed)
   06/27/21 0225  Vitals  BP (!) 152/103  MAP (mmHg) 117  BP Location Right Arm  BP Method Automatic  Patient Position (if appropriate) Sitting  Pulse Rate 68  Pulse Rate Source Dinamap  Resp 16  Level of Consciousness  Level of Consciousness Alert  MEWS COLOR  MEWS Score Color Green  MEWS Score  MEWS Temp 0  MEWS Systolic 0  MEWS Pulse 0  MEWS RR 0  MEWS LOC 0  MEWS Score 0  New bp taken with new cuff.

## 2021-06-27 NOTE — Progress Notes (Addendum)
PROGRESS NOTE    Wendy Hoffman  GYK:[id number] DOB: 1966-09-13 DOA: 06/17/2021 PCP: Patient, No Pcp Per (Inactive)    Brief Narrative: 55 y.o. female with medical history significant of pancreatic CA, HTN, HLD. Presenting with intractable N/V. She reports that she's had several days of N/V. It's worse with eating. Both solids and liquids come back up within 30 minutes. He home nausea meds do not help. She went to clinic for her normal follow up and chemo session. She started vomiting and could not stop. There was no blood in her vomit. Labs were drawn. She was found to be in AKI. It was recommended that she come to the ED for help. She denies any other aggravating or alleviating factors.    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   This is thought to be secondary to pancreatic cancer with partially obstructing infiltrating duodenal mass/duodenal adenocarcinoma per EGD in March 2022. She has been treated for the oral thrush. CT abdomen and pelvis times 2 without contrast  shows no evidence of bowel obstruction. GJ tube placement by IR on 7/18 Continue ivf   #2 e coli uti pansensitive sensitive finished 3 days of IV antibiotics.  #3 odynophagia due to gastritis status post EGD normal esophagus severe gastritis in the prepyloric area nonobstructive mass in the duodenum biopsy showing adenocarcinoma, biopsy of the stomach with no H. Pylori Continue IV PPI.  #4 AKI creatinine  1,01 from 3.25 improving with IV fluids.  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  #7 HTN restart norvasc added prn hydralazine      Nutrition Problem: Moderate Malnutrition Etiology: chronic illness, cancer and cancer related treatments     Signs/Symptoms: percent weight loss, energy intake < or equal to 75% for > or equal to 1 month, mild muscle depletion    Interventions: Refer to RD note for recommendations  Estimated body mass index is 38.62 kg/m as calculated from the following:   Height as of this encounter: 5\' 4"  (1.626 m).   Weight as of this encounter: 102.1 kg.  DVT prophylaxis:scd Code Status: Full Family Communication: None at bedside Disposition Plan:  Status is: Inpatient  Remains inpatient appropriate because:IV treatments appropriate due to intensity of illness or inability to take PO  Dispo: The patient is from: Home              Anticipated d/c is to: Home              Patient currently is not medically stable to d/c.   Difficult to place patient No    Consultants: GI and oncology  Procedures: Upper endoscopy 06/19/2021 Antimicrobials: Status post Levaquin and Rocephin and Diflucan Subjective:she feels better  On dilaudid and oxy  On  Zofran, Phenergan, Reglan, Ativan, scopolamine patch PPI and sucralfate She does not want to enroll in hospice She would like to have a PEG tube placed  Objective: Vitals:   06/26/21 2029 06/27/21 0125 06/27/21 0225 06/27/21 0455  BP: (!) 153/90 (!) 181/115 (!) 152/103 (!) 157/89  Pulse: (!) 58 71 68 (!) 56  Resp: 19  16 18   Temp: 98.3 F (36.8 C)   98.5 F (36.9 C)  TempSrc: Oral   Oral  SpO2: 100%   100%  Weight:      Height:        Intake/Output Summary (Last 24 hours) at 06/27/2021 1050 Last data filed at 06/26/2021 1610 Gross per 24 hour  Intake 480 ml  Output --  Net 480 ml    Filed  Weights   06/25/21 1100  Weight: 102.1 kg    Examination:  General exam: Appears calm and comfortable  Respiratory system: Clear to auscultation. Respiratory effort normal. Cardiovascular system: S1 & S2 heard, RRR. No JVD, murmurs, rubs, gallops or clicks. No pedal edema. Gastrointestinal system: Abdomen is nondistended, soft and nontender. No organomegaly or masses felt. Normal bowel sounds heard. Central nervous system: Alert and oriented. No focal neurological deficits. Extremities: Symmetric 5 x 5 power. Skin: No rashes, lesions or ulcers Psychiatry: Judgement and insight appear normal. Mood & affect appropriate.     Data Reviewed: I have personally reviewed following labs and imaging studies  CBC: Recent Labs  Lab 06/21/21 0652 06/22/21 0458 06/23/21 0500 06/24/21 0557 06/27/21 0511  WBC 4.8 5.5 4.6 4.8 5.0  NEUTROABS 3.0 3.8  --   --   --   HGB 9.9* 9.8* 9.5* 9.8* 8.9*  HCT 30.2* 31.0* 29.3* 30.0* 27.9*  MCV 90.7 92.0 91.6 90.4 91.5  PLT 126* 134* 137* 150 644    Basic Metabolic Panel: Recent Labs  Lab 06/21/21 0652 06/22/21 0458 06/23/21 0500 06/24/21 0557 06/25/21 1055 06/27/21 0511  NA 145 142 141 139 137 140  K 3.4* 3.4* 3.3* 3.9 3.7 2.9*  CL 112* 110 110 109 106 108  CO2 26 24 24 23  21* 26  GLUCOSE 97 102* 92 132* 121* 99  BUN 10 7 <5* <5* 9 9  CREATININE 1.26* 1.15* 0.97 0.94 1.11* 1.01*  CALCIUM 8.4* 8.3* 8.2* 8.4* 8.6* 8.2*  MG 2.0 1.6* 1.8 1.6*  --   --   PHOS 2.7 3.1 2.5 2.7  --   --     GFR: Estimated Creatinine Clearance: 74.9 mL/min (A) (by C-G formula based on SCr of 1.01 mg/dL (H)). Liver Function Tests: Recent Labs  Lab 06/22/21 0458 06/23/21 0500 06/24/21 0557 06/25/21 1055 06/27/21 0511  AST  --   --   --  47* 59*  ALT  --   --   --  16 22  ALKPHOS  --   --   --  80 80  BILITOT  --   --   --  0.7 0.6  PROT  --   --   --  5.7* 5.2*  ALBUMIN 2.7* 2.7* 2.8* 3.1* 3.0*    No results for input(s): LIPASE, AMYLASE in the  last 168 hours. No results for input(s): AMMONIA in the last 168 hours. Coagulation Profile: Recent Labs  Lab 06/27/21 0511  INR 1.2   Cardiac Enzymes: No results for input(s): CKTOTAL, CKMB, CKMBINDEX, TROPONINI in the last 168 hours. BNP (last 3 results) No results for input(s): PROBNP in the last 8760 hours. HbA1C: No results for input(s): HGBA1C in the last 72 hours. CBG: Recent Labs  Lab 06/23/21 0856 06/23/21 2151 06/24/21 0021  GLUCAP 91 125* 124*    Lipid  Profile: No results for input(s): CHOL, HDL, LDLCALC, TRIG, CHOLHDL, LDLDIRECT in the last 72 hours. Thyroid Function Tests: No results for input(s): TSH, T4TOTAL, FREET4, T3FREE, THYROIDAB in the last 72 hours. Anemia Panel: No results for input(s): VITAMINB12, FOLATE, FERRITIN, TIBC, IRON, RETICCTPCT in the last 72 hours. Sepsis Labs: No results for input(s): PROCALCITON, LATICACIDVEN in the last 168 hours.  Recent Results (from the past 240 hour(s))  SARS CORONAVIRUS 2 (TAT 6-24 HRS) Nasopharyngeal Nasopharyngeal Swab     Status: None   Collection Time: 06/17/21  4:20 PM   Specimen: Nasopharyngeal Swab  Result Value Ref Range Status   SARS Coronavirus 2 NEGATIVE NEGATIVE Final    Comment: (NOTE) SARS-CoV-2 target nucleic acids are NOT DETECTED.  The SARS-CoV-2 RNA is generally detectable in upper and lower respiratory specimens during the acute phase of infection. Negative results do not preclude SARS-CoV-2 infection, do not rule out co-infections with other pathogens, and should not be used as the sole basis for treatment or other patient management decisions. Negative results must be combined with clinical observations, patient history, and epidemiological information. The expected result is Negative.  Fact Sheet for Patients: SugarRoll.be  Fact Sheet for Healthcare Providers: https://www.woods-.com/  This test is not yet approved or cleared by the  [place] FDA and  has been authorized for detection and/or diagnosis of SARS-CoV-2 by FDA under an Emergency Use Authorization (EUA). This EUA will remain  in effect (meaning this test can be used) for the duration of the COVID-19 declaration under Se ction 564(b)(1) of the Act, 21 U.S.C. section 360bbb-3(b)(1), unless the authorization is terminated or revoked sooner.  Performed at [facility], [place] [address] [place], [place] [address]   Culture, Urine     Status: Abnormal   Collection Time: 06/18/21 10:00 AM   Specimen: Urine, Catheterized  Result Value Ref Range Status   Specimen Description   Final    URINE, CATHETERIZED Performed at [facility] [address] [place], [place] [address]    Special Requests   Final    NONE Performed at [facility], [place] [address], [place], [place] [address]    Culture >=100,000 COLONIES/mL ESCHERICHIA COLI (A)  Final   Report Status 06/20/2021 FINAL  Final   Organism ID, Bacteria ESCHERICHIA COLI (A)  Final      Susceptibility   Escherichia coli - MIC*    AMPICILLIN 8 SENSITIVE Sensitive     CEFAZOLIN <=4 SENSITIVE Sensitive     CEFEPIME <=0.12 SENSITIVE Sensitive     CEFTRIAXONE <=0.25 SENSITIVE Sensitive     CIPROFLOXACIN <=0.25 SENSITIVE Sensitive     GENTAMICIN <=1 SENSITIVE Sensitive     IMIPENEM <=0.25 SENSITIVE Sensitive     NITROFURANTOIN <=16 SENSITIVE Sensitive     TRIMETH/SULFA <=20 SENSITIVE Sensitive     AMPICILLIN/SULBACTAM <=2 SENSITIVE Sensitive     PIP/TAZO <=4 SENSITIVE Sensitive     * >=100,000 COLONIES/mL ESCHERICHIA COLI          Radiology Studies: CT ABDOMEN PELVIS W CONTRAST  Result Date: 06/26/2021 CLINICAL DATA:  Worsening nausea and vomiting over the last week, pancreatic cancer undergoing chemotherapy EXAM: CT ABDOMEN AND PELVIS WITH CONTRAST TECHNIQUE: Multidetector CT imaging of the abdomen and pelvis was performed using the standard protocol  following bolus administration of intravenous contrast. CONTRAST:  38mL OMNIPAQUE IOHEXOL 350 MG/ML SOLN COMPARISON:  06/17/2021, 05/23/2021, 02/16/2021 FINDINGS: Lower chest: No acute pleural or parenchymal lung disease. Central venous catheter tip  within the right atrium near the junction with the IVC. No pericardial effusion. Hepatobiliary: Multiple hepatic masses are again identified. Largest lesion within the left lobe liver reference image 27/2 measures 8.0 x 6.1 cm, previously measuring 7.6 x 6.0 cm on MRI 05/23/2021. Largest lesion within the right lobe liver measures up to 8.2 x 4.5 cm on image 23/2, previously measuring 7.6 x 3.7 cm on MRI. Smaller lesions throughout the left lobe liver more inferiorly are grossly unchanged, though less well visualized on CT compared to MRI. No new lesions are definitively identified. No intrahepatic biliary duct dilation. Gallbladder is surgically absent. Pancreas: The ill-defined mass within the pancreatic head measures up to approximately 2.8 x 3.1 cm reference image 65/2, previously having measured 2.9 x 2.9 cm on MRI 05/23/2021. Mild pancreatic duct dilation and upstream pancreatic parenchymal atrophy again noted unchanged. Spleen: No focal splenic abnormalities. Mild splenomegaly unchanged. Adrenals/Urinary Tract: No urinary tract calculi or obstructive uropathy within either kidney. The kidneys enhance normally and symmetrically. Small left renal cortical cysts unchanged. The adrenals are unremarkable. Bladder is only minimally distended with no gross abnormalities. Stomach/Bowel: No bowel obstruction or ileus. Normal retrocecal appendix. No bowel wall thickening or inflammatory change. Vascular/Lymphatic: Mild atherosclerosis of the aorta and iliac vessels unchanged. No discrete adenopathy within the abdomen or pelvis. Reproductive: Status post hysterectomy. No adnexal masses. Other: Trace pelvic free fluid. No free intraperitoneal gas. No abdominal wall hernia.  Musculoskeletal: No acute or destructive bony lesions. Reconstructed images demonstrate no additional findings. IMPRESSION: 1. Grossly stable mass within the pancreatic head compatible with known pancreatic cancer. 2. No significant change in the size and number of known liver metastases, allowing for differences in technique compared to previous MRI. 3. Trace pelvic free fluid. 4.  Aortic Atherosclerosis (ICD10-I70.0). Electronically Signed   By: Randa Ngo M.D.   On: 06/26/2021 17:01        Scheduled Meds:  Chlorhexidine Gluconate Cloth  6 each Topical Daily   feeding supplement  1 Container Oral TID BM   heparin lock flush  500 Units Intracatheter Q30 days   morphine  30 mg Oral Q12H   pantoprazole (PROTONIX) IV  40 mg Intravenous Q12H   scopolamine  1 patch Transdermal Q72H   sodium chloride flush  10-40 mL Intracatheter Q12H   sucralfate  1 g Oral Q6H   Continuous Infusions:  dextrose 5 % and 0.9% NaCl 100 mL/hr at 06/27/21 0150   erythromycin 500 mg (06/27/21 0524)   potassium chloride 10 mEq (06/27/21 0953)   promethazine (PHENERGAN) injection (IM or IVPB) 12.5 mg (06/26/21 1610)   [START ON 06/29/2021] vancomycin       LOS: 10 days    Time spent: 40 min    Georgette Shell, MD  06/27/2021, 10:50 AM

## 2021-06-27 NOTE — Progress Notes (Signed)
Subjective: Patient states that she had a piece of toast today without any associated nausea or vomiting. Abdomen does not feel distended or painful. She has not had a bowel movement in 2 days but is passing gas.  Objective: Vital signs in last 24 hours: Temp:  [98 F (36.7 C)-98.5 F (36.9 C)] 98 F (36.7 C) (07/16 1248) Pulse Rate:  [56-86] 86 (07/16 1248) Resp:  [16-19] 18 (07/16 1248) BP: (149-181)/(89-115) 149/101 (07/16 1248) SpO2:  [100 %] 100 % (07/16 1248) Weight change:  Last BM Date: 06/25/21  PE: Sitting comfortably on bedside chair GENERAL: Prominent pallor, no icterus  ABDOMEN: Soft, nondistended, normoactive bowel sounds EXTREMITIES: No deformity  Lab Results: Results for orders placed or performed during the hospital encounter of 06/17/21 (from the past 48 hour(s))  CBC     Status: Abnormal   Collection Time: 06/27/21  5:11 AM  Result Value Ref Range   WBC 5.0 4.0 - 10.5 K/uL   RBC 3.05 (L) 3.87 - 5.11 MIL/uL   Hemoglobin 8.9 (L) 12.0 - 15.0 g/dL   HCT 27.9 (L) 36.0 - 46.0 %   MCV 91.5 80.0 - 100.0 fL   MCH 29.2 26.0 - 34.0 pg   MCHC 31.9 30.0 - 36.0 g/dL   RDW 16.9 (H) 11.5 - 15.5 %   Platelets 151 150 - 400 K/uL   nRBC 0.0 0.0 - 0.2 %    Comment: Performed at [facility], [place] [address] [place], [place] [address]  Comprehensive metabolic panel     Status: Abnormal   Collection Time: 06/27/21  5:11 AM  Result Value Ref Range   Sodium 140 135 - 145 mmol/L   Potassium 2.9 (L) 3.5 - 5.1 mmol/L    Comment: DELTA CHECK NOTED   Chloride 108 98 - 111 mmol/L   CO2 26 22 - 32 mmol/L   Glucose, Bld 99 70 - 99 mg/dL    Comment: Glucose reference range applies only to samples taken after fasting for at least 8 hours.   BUN 9 6 - 20 mg/dL   Creatinine, Ser 1.01 (H) 0.44 - 1.00 mg/dL   Calcium 8.2 (L) 8.9 - 10.3 mg/dL   Total Protein 5.2 (L) 6.5 - 8.1 g/dL   Albumin 3.0 (L) 3.5 - 5.0 g/dL   AST 59 (H) 15 - 41 U/L   ALT 22 0 - 44 U/L    Alkaline Phosphatase 80 38 - 126 U/L   Total Bilirubin 0.6 0.3 - 1.2 mg/dL   GFR, Estimated >60 >60 mL/min    Comment: (NOTE) Calculated using the CKD-EPI Creatinine Equation (2021)    Anion gap 6 5 - 15    Comment: Performed at [facility], [place] [address] [place], [place] [address]  Protime-INR     Status: None   Collection Time: 06/27/21  5:11 AM  Result Value Ref Range   Prothrombin Time 15.2 11.4 - 15.2 seconds   INR 1.2 0.8 - 1.2    Comment: (NOTE) INR goal varies based on device and disease states. Performed at [facility], [place] [address] [place], [place] [address]     Studies/Results: CT ABDOMEN PELVIS W CONTRAST  Result Date: 06/26/2021 CLINICAL DATA:  Worsening nausea and vomiting over the last week, pancreatic cancer undergoing chemotherapy EXAM: CT ABDOMEN AND PELVIS WITH CONTRAST TECHNIQUE: Multidetector CT imaging of the abdomen and pelvis was performed using the standard protocol following bolus administration of intravenous contrast. CONTRAST:  24mL  OMNIPAQUE IOHEXOL 350 MG/ML SOLN COMPARISON:  06/17/2021, 05/23/2021, 02/16/2021 FINDINGS: Lower chest: No acute pleural or parenchymal lung disease. Central venous catheter tip within the right atrium near the junction with the IVC. No pericardial effusion. Hepatobiliary: Multiple hepatic masses are again identified. Largest lesion within the left lobe liver reference image 27/2 measures 8.0 x 6.1 cm, previously measuring 7.6 x 6.0 cm on MRI 05/23/2021. Largest lesion within the right lobe liver measures up to 8.2 x 4.5 cm on image 23/2, previously measuring 7.6 x 3.7 cm on MRI. Smaller lesions throughout the left lobe liver more inferiorly are grossly unchanged, though less well visualized on CT compared to MRI. No new lesions are definitively identified. No intrahepatic biliary duct dilation. Gallbladder is surgically absent. Pancreas: The ill-defined mass within the pancreatic head  measures up to approximately 2.8 x 3.1 cm reference image 65/2, previously having measured 2.9 x 2.9 cm on MRI 05/23/2021. Mild pancreatic duct dilation and upstream pancreatic parenchymal atrophy again noted unchanged. Spleen: No focal splenic abnormalities. Mild splenomegaly unchanged. Adrenals/Urinary Tract: No urinary tract calculi or obstructive uropathy within either kidney. The kidneys enhance normally and symmetrically. Small left renal cortical cysts unchanged. The adrenals are unremarkable. Bladder is only minimally distended with no gross abnormalities. Stomach/Bowel: No bowel obstruction or ileus. Normal retrocecal appendix. No bowel wall thickening or inflammatory change. Vascular/Lymphatic: Mild atherosclerosis of the aorta and iliac vessels unchanged. No discrete adenopathy within the abdomen or pelvis. Reproductive: Status post hysterectomy. No adnexal masses. Other: Trace pelvic free fluid. No free intraperitoneal gas. No abdominal wall hernia. Musculoskeletal: No acute or destructive bony lesions. Reconstructed images demonstrate no additional findings. IMPRESSION: 1. Grossly stable mass within the pancreatic head compatible with known pancreatic cancer. 2. No significant change in the size and number of known liver metastases, allowing for differences in technique compared to previous MRI. 3. Trace pelvic free fluid. 4.  Aortic Atherosclerosis (ICD10-I70.0). Electronically Signed   By: Randa Ngo M.D.   On: 06/26/2021 17:01    Medications: I have reviewed the patient's current medications.  Assessment: Metastatic pancreatic cancer, liver metastases CT shows no bowel obstruction or ileus Nausea and vomiting-resolved on scopolamine patch   Plan: Soft diet has been ordered by primary team If patient is able to tolerate diet, I do not think she will need a feeding tube On erythromycin 500 mg IV every 8 hours for suspected gastroparesis On scopolamine patch every 72 hours On  pantoprazole 40 mg every 12 hours and sucralfate 1 g every 6 hours  If patient able to tolerate diet, recommend decreasing erythromycin to 50 mg 3 times a day. Limiting use of narcotics. GI will follow from a distance, please recall earlier if needed.  Ronnette Juniper, MD 06/27/2021, 1:18 PM

## 2021-06-27 NOTE — Progress Notes (Signed)
   06/27/21 0125  Vitals  BP (!) 181/115  MAP (mmHg) 135  BP Method Automatic  Pulse Rate 71  MEWS COLOR  MEWS Score Color Green  MEWS Score  MEWS Temp 0  MEWS Systolic 0  MEWS Pulse 0  MEWS RR 0  MEWS LOC 0  MEWS Score 0  Patient has elevated blood pressures, reached out to physician for possible BP medications.

## 2021-06-28 ENCOUNTER — Inpatient Hospital Stay (HOSPITAL_COMMUNITY): Payer: Medicaid Other

## 2021-06-28 DIAGNOSIS — R112 Nausea with vomiting, unspecified: Secondary | ICD-10-CM | POA: Diagnosis not present

## 2021-06-28 DIAGNOSIS — C250 Malignant neoplasm of head of pancreas: Secondary | ICD-10-CM | POA: Diagnosis not present

## 2021-06-28 LAB — BASIC METABOLIC PANEL
Anion gap: 7 (ref 5–15)
BUN: 6 mg/dL (ref 6–20)
CO2: 27 mmol/L (ref 22–32)
Calcium: 8.5 mg/dL — ABNORMAL LOW (ref 8.9–10.3)
Chloride: 105 mmol/L (ref 98–111)
Creatinine, Ser: 0.85 mg/dL (ref 0.44–1.00)
GFR, Estimated: >60 mL/min (ref >60)
Glucose, Bld: 89 mg/dL (ref 70–99)
Potassium: 3.1 mmol/L — ABNORMAL LOW (ref 3.5–5.1)
Sodium: 139 mmol/L (ref 135–145)

## 2021-06-28 MED ORDER — MAGNESIUM SULFATE 2 GM/50ML IV SOLN
2.0000 g | Freq: Once | INTRAVENOUS | Status: AC
  Administered 2021-06-28: 2 g via INTRAVENOUS
  Filled 2021-06-28: qty 50

## 2021-06-28 MED ORDER — POTASSIUM CHLORIDE 10 MEQ/100ML IV SOLN
10.0000 meq | INTRAVENOUS | Status: AC
  Administered 2021-06-28 (×4): 10 meq via INTRAVENOUS
  Filled 2021-06-28 (×4): qty 100

## 2021-06-28 NOTE — Progress Notes (Signed)
HEMATOLOGY-ONCOLOGY PROGRESS NOTE  SUBJECTIVE: Her nausea remains improved.  No vomiting yesterday.  She continues to have abdominal pain.  She reports pain in the mid back.  She feels tightness in the legs.  Leg weakness.  The legs are tingling. 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  The report date is March 23, 2021.   The Multi-Cancer Panel with pancreatitis genes and preliminary pancreatic cancer genes offered by [facility] includes sequencing and/or deletion duplication testing of the following 91 genes: AIP, ALK, APC, ATM, AXIN2,BAP1,  BARD1, BLM, BMPR1A, BRCA1, BRCA2, BRIP1, CASR, CDC73, CDH1, CDK4, CDKN1B, CDKN1C, CDKN2A (p14ARF), CDKN2A (p16INK4a), CEBPA, CFTR, CHEK2, CPA1, CTNNA1, CTRC, DICER1, DIS3L2, EGFR (c.2369C>T, p.Thr790Met variant only), EPCAM (Deletion/duplication testing only), FANCC, FH, FLCN, GATA2, GPC3, GREM1 (Promoter region deletion/duplication testing only), HOXB13 (c.251G>A, p.Gly84Glu), HRAS, KIT, MAX, MEN1, MET, MITF (c.952G>A, p.Glu318Lys variant only), MLH1, MSH2, MSH3, MSH6, MUTYH, NBN, NF1, NF2, NTHL1, PALB2, PALLD, PDGFRA, PHOX2B, PMS2, POLD1, POLE, POT1, PRKAR1A, PRSS1, PTCH1, PTEN, RAD50, RAD51C, RAD51D, RB1, RECQL4, RET, RNF43, RUNX1, SDHAF2, SDHA (sequence changes only), SDHB, SDHC, SDHD, SMAD4, SMARCA4, SMARCB1, SMARCE1, SPINK1, STK11, SUFU, TERC, TERT, TMEM127, TP53, TSC1, TSC2, VHL, WRN and WT1.      PHYSICAL EXAMINATION:  Vitals:   06/28/21 0449 06/28/21 1315  BP: 133/81 (!) 170/110  Pulse: 66 65  Resp: 20 16  Temp: 98.8 F (37.1 C) 98 F (36.7 C)  SpO2: 100% 100%   Intake/Output from previous day: 07/16 0701 - 07/17 0700 In: 117 [P.O.:117] Out: -   OROPHARYNX: No thrush ABDOMEN: Tender in the right and mid upper abdomen NEURO: alert & oriented x 3 with fluent speech, the motor exam appears intact in the legs bilaterally Vascular: No leg edema Musculoskeletal: Tender at the mid thoracic spine  LABORATORY DATA:  I have reviewed the data as listed CMP Latest Ref Rng & Units 06/28/2021 06/27/2021 06/25/2021  Glucose 70 - 99 mg/dL 89 99 121(H)  BUN 6 - 20 mg/dL '6 9 9  ' Creatinine 0.44 - 1.00 mg/dL 0.85 1.01(H) 1.11(H)  Sodium 135 - 145 mmol/L 139 140 137  Potassium 3.5 - 5.1 mmol/L 3.1(L) 2.9(L) 3.7  Chloride 98 - 111 mmol/L 105 108 106  CO2 22 - 32 mmol/L 27 26 21(L)  Calcium 8.9 - 10.3 mg/dL 8.5(L) 8.2(L) 8.6(L)  Total Protein 6.5 - 8.1 g/dL - 5.2(L) 5.7(L)  Total Bilirubin 0.3 - 1.2 mg/dL - 0.6 0.7  Alkaline Phos 38 - 126 U/L - 80 80  AST 15 - 41 U/L - 59(H) 47(H)  ALT 0 - 44 U/L - 22 16    Lab Results  Component Value Date   WBC 5.0 06/27/2021   HGB 8.9 (L) 06/27/2021   HCT 27.9 (L) 06/27/2021   MCV 91.5 06/27/2021   PLT 151 06/27/2021   NEUTROABS 3.8 06/22/2021    CT ABDOMEN PELVIS WO CONTRAST  Result Date: 06/17/2021 CLINICAL DATA:  Rule out bowel obstruction. History of pancreas cancer. EXAM: CT ABDOMEN AND PELVIS WITHOUT CONTRAST TECHNIQUE: Multidetector CT imaging of the abdomen and pelvis was performed following the standard protocol without IV contrast. COMPARISON:  05/23/2021 FINDINGS: Lower chest: No acute abnormality. Hepatobiliary: Within a background of diffuse hepatic steatosis there are multiple liver metastases as noted previously: The dominant lesion within the lateral segment of left hepatic lobe measures 7.8 cm, image 26/2. Previously this  measured the same. The dominant lesion within the right hepatic lobe measures 8.2 cm, image 25/2. Previously 8 cm. Status post cholecystectomy. No biliary dilatation. Pancreas: Head of pancreas mass is suboptimally evaluated due to lack of IV contrast material. Subjectively, this does not appear significantly changed in size when compared with the previous contrast enhanced MRI. Spleen: Normal in size without focal abnormality. Adrenals/Urinary Tract: Normal adrenal glands. No hydronephrosis identified bilaterally. Punctate stone noted within inferior pole of left kidney. Exophytic cyst is again noted arising off the posterior cortex of the interpolar right kidney measuring 1.3 cm. Urinary bladder appears collapsed. Stomach/Bowel: Stomach is nondistended. The appendix is visualized and appears normal. No pathologic dilatation of the large or small bowel loops. No significant bowel wall thickening or surrounding inflammatory fat stranding. Vascular/Lymphatic: Mild aortic atherosclerosis. No aneurysm. No abdominopelvic adenopathy identified. Reproductive: Status post hysterectomy. No adnexal masses.  The Other: No ascites or focal fluid collections. No peritoneal nodularity identified at this time. Musculoskeletal: No acute or significant osseous findings. IMPRESSION: 1. No acute findings within the abdomen or pelvis. No evidence for bowel obstruction. 2. Multiple liver metastases.  Similar to previous exam. 3. Head of pancreas mass is suboptimally evaluated due to lack of IV contrast material. Subjectively, this does not appear significantly changed in size when compared with the previous contrast enhanced MRI. 4. Aortic atherosclerosis. Aortic Atherosclerosis (ICD10-I70.0). Electronically Signed   By: Kerby Moors M.D.   On: 06/17/2021 19:07   CT ABDOMEN PELVIS W CONTRAST  Result Date: 06/26/2021 CLINICAL DATA:  Worsening nausea and vomiting over the last week, pancreatic cancer undergoing chemotherapy EXAM:  CT ABDOMEN AND PELVIS WITH CONTRAST TECHNIQUE: Multidetector CT imaging of the abdomen and pelvis was performed using the standard protocol following bolus administration of intravenous contrast. CONTRAST:  39m OMNIPAQUE IOHEXOL 350 MG/ML SOLN COMPARISON:  06/17/2021, 05/23/2021, 02/16/2021 FINDINGS: Lower chest: No acute pleural or parenchymal lung disease. Central venous catheter tip within the right atrium near the junction with the IVC. No pericardial effusion. Hepatobiliary: Multiple hepatic masses are again identified. Largest lesion within the left lobe liver reference image 27/2 measures 8.0 x 6.1 cm, previously measuring 7.6 x 6.0 cm on MRI 05/23/2021. Largest lesion within the right lobe liver measures up to 8.2 x 4.5 cm on image 23/2, previously measuring 7.6 x 3.7 cm on MRI. Smaller lesions throughout the left lobe liver more inferiorly are grossly unchanged, though less well visualized on CT compared to MRI. No new lesions are definitively identified. No intrahepatic biliary duct dilation. Gallbladder is surgically absent. Pancreas: The ill-defined mass within the pancreatic head measures up to approximately 2.8 x 3.1 cm reference image 65/2, previously having measured 2.9 x 2.9 cm on MRI 05/23/2021. Mild pancreatic duct dilation and upstream pancreatic parenchymal atrophy again noted unchanged. Spleen: No focal splenic abnormalities. Mild splenomegaly unchanged. Adrenals/Urinary Tract: No urinary tract calculi or obstructive uropathy within either kidney. The kidneys enhance normally and symmetrically. Small left renal cortical cysts unchanged. The adrenals are unremarkable. Bladder is only minimally distended with no gross  abnormalities. Stomach/Bowel: No bowel obstruction or ileus. Normal retrocecal appendix. No bowel wall thickening or inflammatory change. Vascular/Lymphatic: Mild atherosclerosis of the aorta and iliac vessels unchanged. No discrete adenopathy within the abdomen or pelvis.  Reproductive: Status post hysterectomy. No adnexal masses. Other: Trace pelvic free fluid. No free intraperitoneal gas. No abdominal wall hernia. Musculoskeletal: No acute or destructive bony lesions. Reconstructed images demonstrate no additional findings. IMPRESSION: 1. Grossly stable mass within the pancreatic head compatible with known pancreatic cancer. 2. No significant change in the size and number of known liver metastases, allowing for differences in technique compared to previous MRI. 3. Trace pelvic free fluid. 4.  Aortic Atherosclerosis (ICD10-I70.0). Electronically Signed   By: Randa Ngo M.D.   On: 06/26/2021 17:01    ASSESSMENT AND PLAN: 1. Pancreas cancer -CT abdomen/pelvis without contrast 02/16/2021-multiple large hypoechoic masses in the patient's liver consistent metastatic disease, ill-defined masslike area in the pancreatic head measuring approximate 4.7 cm, possible filling defect in the right ventricle. -EGD 02/19/2021-large infiltrative mass with bleeding in the second portion of the duodenum, appears to be arising near the ampulla, partially obstructive with friable mucosa.  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 -Flexible sigmoidoscopy 02/19/2021-diverticulosis in the sigmoid colon, nonbleeding external and internal hemorrhoids -Cardiac CT 02/20/2021-mass in the mid RV attached to the interventricular septum measuring 16 mm x 6 mm and concerning for metastatic tumor -MRI abdomen with/without contrast 02/21/2021-mass in the posterior pancreatic head measuring 4.3 x 3.8 cm with obstruction of the central pancreatic duct and diffuse dilatation up to 6 mm consistent with pancreatic adenocarcinoma, liver lesions the largest mass of the central left lobe measuring 9.9 x 9.2 cm consistent with hepatic metastatic disease,  hepatomegaly. -Ultrasound-guided biopsy of a left liver lesion 02/24/2021-adenocarcinoma, morphologically similar to the duodenal biopsy;  microsatellite stable, tumor mutation burden 1 -Negative genetic testing -Palliative radiation to the duodenal mass 02/25/2021-03/06/2021 -Cycle 1 FOLFOX 03/17/2021 -Cycle 2 FOLFOX 04/01/2021 -Cycle 3 FOLFIRINOX 04/14/2021 -Cycle 4 FOLFIRINOX 04/27/2021, Udenyca added -Cycle 5 FOLFIRINOX 05/12/2021, Udenyca -MRI abdomen 05/23/2021-decrease in size of pancreas mass and hepatic lesions, no progressive disease -Cycle 6 FOLFIRINOX 05/27/2021 2.  Iron deficiency anemia-improved -Feraheme 510 mg 02/20/2021 3.  Protein calorie malnutrition 4.  Possible right ventricular filling defect on noncontrast CT scan MRI cardiac morphology 02/21/2021-mass in the mid RV attached to the interventricular septum measures 16 mm x 6 mm.  Mass appears heterogeneous with area of enhancement on LGE imaging.  Mass is concerning for metastatic tumor.  Normal LV size and systolic function.  Normal RV size and systolic function. 5.  Hypertension 6.  History of CVA 7.  Hyperlipidemia 8.  Hypothyroidism 9.  Morbid obesity 10.  Asthma 11.  Migraines 12.  Pain secondary to #1 13.  Port-A-Cath placement 02/25/2021 14.  Hypokalemia 04/01/2021-started a potassium supplement 15.  Hospital admission 06/17/2021-intractable nausea and vomiting EGD 06/19/2021-diffuse gastritis, nonobstructing duodenal mass, biopsy of stomach-no malignancy or H. pylori, duodenal mass-adenocarcinoma  Ms. Mcfayden has experienced significant improvement in nausea for the past 2 days.  I encouraged her to increase her diet as tolerated.  She is scheduled for placement of a G-J-tube tomorrow.  We can consider holding off on tube placement if she is able to increase her calorie intake today.  She continues to have pain, likely secondary to liver metastases and the primary pancreas tumor.  I have a low clinical suspicion for spine metastases.  She will continue MS Contin, Dilaudid, and oxycodone as needed for pain.  Recommendations: 1.  Continue antacid regimen. 2.   Continue IV erythromycin per GI. 3.  Advance diet as tolerated 4.   proceed with feeding tube on 06/29/2021 depending on oral intake today 5.  Continue MS Contin with Dilaudid and oxycodone as needed for breakthrough pain   LOS: 11 days   Betsy Coder 06/28/21   Julieanne Manson, MD

## 2021-06-28 NOTE — Progress Notes (Signed)
PROGRESS NOTE    Wendy Hoffman  QIO:[id number] DOB: 06/24/1966 DOA: 06/17/2021 PCP: Patient, No Pcp Per (Inactive)    Brief Narrative: 55 y.o. female with medical history significant of pancreatic CA, HTN, HLD. Presenting with intractable N/V. She reports that she's had several days of N/V. It's worse with eating. Both solids and liquids come back up within 30 minutes. He home nausea meds do not help. She went to clinic for her normal follow up and chemo session. She started vomiting and could not stop. There was no blood in her vomit. Labs were drawn. She was found to be in AKI. It was recommended that she come to the ED for help. She denies any other aggravating or alleviating factors.    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  Later she was ambulating in the hallway with the staff.  Earlier she had complaints about bilateral lower extremity tingling and back pain.  There was no pitting edema noted.  Plan was to get a GJ tube 06/29/2021.  Will advance diet as tolerated and see how she tolerates it.   Continue scopolamine patch.  She has been treated for the oral thrush. CT abdomen and pelvis times 2 without contrast  shows no evidence of bowel obstruction. GJ tube placement by IR on 7/18 On ivf   #2 e coli uti pansensitive sensitive finished 3 days of  IV antibiotics.  #3 odynophagia due to gastritis status post EGD normal esophagus severe gastritis in the prepyloric area nonobstructive mass in the duodenum biopsy showing adenocarcinoma, biopsy of the stomach with no H. Pylori Continue IV PPI.  #4 AKI creatinine 1,01 from 3.25 improving with IV fluids.  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  #7 HTN blood pressure better after restarting Norvasc.    Nutrition Problem: Moderate Malnutrition Etiology: chronic illness, cancer and cancer related treatments     Signs/Symptoms: percent weight loss, energy intake < or equal to 75% for > or equal to 1 month, mild muscle depletion    Interventions: Refer to RD note for recommendations  Estimated body mass index is 38.62 kg/m as calculated from the following:   Height as of this encounter: 5\' 4"  (1.626 m).   Weight as of this encounter: 102.1 kg.  DVT prophylaxis:scd Code Status: Full Family Communication: None at bedside Disposition Plan:  Status is: Inpatient  Remains inpatient appropriate because:IV treatments appropriate due to intensity of illness or inability to take PO  Dispo: The patient is from: Home              Anticipated d/c is to: Home              Patient currently is not medically stable to d/c.   Difficult to place patient No    Consultants: GI and oncology  Procedures: Upper endoscopy 06/19/2021 Antimicrobials: Status post Levaquin and Rocephin and Diflucan Subjective: She ate her breakfast this morning at toast and scrambled eggs no vomiting so far.   On  Zofran, Phenergan, Reglan, Ativan, scopolamine patch PPI and sucralfate She does not want to enroll in hospice She would like to have a PEG tube placed if she is not tolerating diet  Objective: Vitals:   06/27/21 0455 06/27/21 1248 06/27/21 2054 06/28/21 0449  BP: (!) 157/89 (!) 149/101 (!)  147/98 133/81  Pulse: (!) 56 86 72 66  Resp: 18 18 20 20   Temp: 98.5 F (36.9 C) 98 F (36.7 C) 98.5 F (36.9 C) 98.8 F (37.1 C)  TempSrc: Oral Oral Oral Oral  SpO2: 100% 100% 99% 100%  Weight:      Height:       No intake or output data in the 24 hours ending 06/28/21 1259  Filed Weights   06/25/21 1100  Weight: 102.1 kg    Examination:  General exam: Appears calm and comfortable  Respiratory system: Clear to auscultation. Respiratory effort normal. Cardiovascular system: S1 & S2 heard, RRR. No JVD, murmurs, rubs, gallops or clicks. No pedal edema. Gastrointestinal system: Abdomen is nondistended, soft and nontender. No organomegaly or masses felt. Normal bowel sounds heard. Central nervous system: Alert and oriented. No focal neurological deficits. Extremities: Symmetric 5 x 5 power. Skin: No rashes, lesions or ulcers Psychiatry: Judgement and insight appear normal. Mood & affect appropriate.     Data Reviewed: I have personally reviewed following labs and imaging studies  CBC: Recent Labs  Lab 06/22/21 0458 06/23/21 0500 06/24/21 0557 06/27/21 0511  WBC 5.5 4.6 4.8 5.0  NEUTROABS 3.8  --   --   --   HGB 9.8* 9.5* 9.8* 8.9*  HCT 31.0* 29.3* 30.0* 27.9*  MCV 92.0 91.6 90.4 91.5  PLT 134* 137* 150 423    Basic Metabolic Panel: Recent Labs  Lab 06/22/21 0458 06/23/21 0500 06/24/21 0557 06/25/21 1055 06/27/21 0511 06/28/21 0500  NA 142 141 139 137 140 139  K 3.4* 3.3* 3.9 3.7 2.9* 3.1*  CL 110 110 109 106 108 105  CO2 24 24 23  21* 26 27  GLUCOSE 102* 92 132* 121* 99 89  BUN 7 <5* <5* 9 9 6   CREATININE 1.15* 0.97 0.94 1.11* 1.01* 0.85  CALCIUM 8.3* 8.2* 8.4* 8.6* 8.2* 8.5*  MG 1.6* 1.8 1.6*  --   --   --   PHOS 3.1 2.5 2.7  --   --   --     GFR: Estimated Creatinine Clearance: 89.1 mL/min (by C-G formula based on SCr of 0.85 mg/dL). Liver Function Tests: Recent Labs  Lab 06/22/21 0458 06/23/21 0500 06/24/21 0557 06/25/21 1055 06/27/21 0511   AST  --   --   --  47* 59*  ALT  --   --   --  16 22  ALKPHOS  --   --   --  80 80  BILITOT  --   --   --  0.7 0.6  PROT  --   --   --  5.7* 5.2*  ALBUMIN 2.7* 2.7* 2.8* 3.1* 3.0*    No results for input(s): LIPASE, AMYLASE in the last 168 hours. No results for input(s): AMMONIA in the last 168 hours. Coagulation Profile: Recent Labs  Lab 06/27/21 0511  INR 1.2    Cardiac Enzymes: No results for input(s): CKTOTAL, CKMB, CKMBINDEX, TROPONINI in the last 168 hours. BNP (  last 3 results) No results for input(s): PROBNP in the last 8760 hours. HbA1C: No results for input(s): HGBA1C in the last 72 hours. CBG: Recent Labs  Lab 06/23/21 0856 06/23/21 2151 06/24/21 0021  GLUCAP 91 125* 124*    Lipid Profile: No results for input(s): CHOL, HDL, LDLCALC, TRIG, CHOLHDL, LDLDIRECT in the last 72 hours. Thyroid Function Tests: No results for input(s): TSH, T4TOTAL, FREET4, T3FREE, THYROIDAB in the last 72 hours. Anemia Panel: No results for input(s): VITAMINB12, FOLATE, FERRITIN, TIBC, IRON, RETICCTPCT in the last 72 hours. Sepsis Labs: No results for input(s): PROCALCITON, LATICACIDVEN in the last 168 hours.  No results found for this or any previous visit (from the past 240 hour(s)).         Radiology Studies: CT ABDOMEN PELVIS W CONTRAST  Result Date: 06/26/2021 CLINICAL DATA:  Worsening nausea and vomiting over the last week, pancreatic cancer undergoing chemotherapy EXAM: CT ABDOMEN AND PELVIS WITH CONTRAST TECHNIQUE: Multidetector CT imaging of the abdomen and pelvis was performed using the standard protocol following bolus administration of intravenous contrast. CONTRAST:  9mL OMNIPAQUE IOHEXOL 350 MG/ML SOLN COMPARISON:  06/17/2021, 05/23/2021, 02/16/2021 FINDINGS: Lower chest: No acute pleural or parenchymal lung disease. Central venous catheter tip within the right atrium near the junction with the IVC. No pericardial effusion. Hepatobiliary: Multiple hepatic masses  are again identified. Largest lesion within the left lobe liver reference image 27/2 measures 8.0 x 6.1 cm, previously measuring 7.6 x 6.0 cm on MRI 05/23/2021. Largest lesion within the right lobe liver measures up to 8.2 x 4.5 cm on image 23/2, previously measuring 7.6 x 3.7 cm on MRI. Smaller lesions throughout the left lobe liver more inferiorly are grossly unchanged, though less well visualized on CT compared to MRI. No new lesions are definitively identified. No intrahepatic biliary duct dilation. Gallbladder is surgically absent. Pancreas: The ill-defined mass within the pancreatic head measures up to approximately 2.8 x 3.1 cm reference image 65/2, previously having measured 2.9 x 2.9 cm on MRI 05/23/2021. Mild pancreatic duct dilation and upstream pancreatic parenchymal atrophy again noted unchanged. Spleen: No focal splenic abnormalities. Mild splenomegaly unchanged. Adrenals/Urinary Tract: No urinary tract calculi or obstructive uropathy within either kidney. The kidneys enhance normally and symmetrically. Small left renal cortical cysts unchanged. The adrenals are unremarkable. Bladder is only minimally distended with no gross abnormalities. Stomach/Bowel: No bowel obstruction or ileus. Normal retrocecal appendix. No bowel wall thickening or inflammatory change. Vascular/Lymphatic: Mild atherosclerosis of the aorta and iliac vessels unchanged. No discrete adenopathy within the abdomen or pelvis. Reproductive: Status post hysterectomy. No adnexal masses. Other: Trace pelvic free fluid. No free intraperitoneal gas. No abdominal wall hernia. Musculoskeletal: No acute or destructive bony lesions. Reconstructed images demonstrate no additional findings. IMPRESSION: 1. Grossly stable mass within the pancreatic head compatible with known pancreatic cancer. 2. No significant change in the size and number of known liver metastases, allowing for differences in technique compared to previous MRI. 3. Trace pelvic  free fluid. 4.  Aortic Atherosclerosis (ICD10-I70.0). Electronically Signed   By: Randa Ngo M.D.   On: 06/26/2021 17:01        Scheduled Meds:  amLODipine  10 mg Oral Daily   Chlorhexidine Gluconate Cloth  6 each Topical Daily   feeding supplement  1 Container Oral TID BM   heparin lock flush  500 Units Intracatheter Q30 days   morphine  30 mg Oral Q12H   pantoprazole (PROTONIX) IV  40 mg Intravenous Q12H   scopolamine  1 patch Transdermal Q72H   sodium chloride flush  10-40 mL Intracatheter Q12H   sucralfate  1 g Oral Q6H   Continuous Infusions:  dextrose 5 % and 0.9% NaCl 100 mL/hr at 06/28/21 1223   erythromycin 500 mg (06/28/21 1225)   promethazine (PHENERGAN) injection (IM or IVPB) 12.5 mg (06/26/21 1610)   [START ON 06/29/2021] vancomycin       LOS: 11 days    Time spent: 40 min    Georgette Shell, MD  06/28/2021, 12:59 PM

## 2021-06-29 ENCOUNTER — Inpatient Hospital Stay (HOSPITAL_COMMUNITY): Payer: Medicaid Other

## 2021-06-29 DIAGNOSIS — M7989 Other specified soft tissue disorders: Secondary | ICD-10-CM

## 2021-06-29 DIAGNOSIS — C250 Malignant neoplasm of head of pancreas: Secondary | ICD-10-CM | POA: Diagnosis not present

## 2021-06-29 DIAGNOSIS — R112 Nausea with vomiting, unspecified: Secondary | ICD-10-CM | POA: Diagnosis not present

## 2021-06-29 LAB — COMPREHENSIVE METABOLIC PANEL
ALT: 21 U/L (ref 0–44)
AST: 33 U/L (ref 15–41)
Albumin: 3.1 g/dL — ABNORMAL LOW (ref 3.5–5.0)
Alkaline Phosphatase: 83 U/L (ref 38–126)
Anion gap: 7 (ref 5–15)
BUN: <5 mg/dL — ABNORMAL LOW (ref 6–20)
CO2: 27 mmol/L (ref 22–32)
Calcium: 8.1 mg/dL — ABNORMAL LOW (ref 8.9–10.3)
Chloride: 105 mmol/L (ref 98–111)
Creatinine, Ser: 0.74 mg/dL (ref 0.44–1.00)
GFR, Estimated: >60 mL/min (ref >60)
Glucose, Bld: 91 mg/dL (ref 70–99)
Potassium: 2.9 mmol/L — ABNORMAL LOW (ref 3.5–5.1)
Sodium: 139 mmol/L (ref 135–145)
Total Bilirubin: 0.7 mg/dL (ref 0.3–1.2)
Total Protein: 5.6 g/dL — ABNORMAL LOW (ref 6.5–8.1)

## 2021-06-29 LAB — CANCER ANTIGEN 19-9: CA 19-9: 24433 U/mL — ABNORMAL HIGH (ref 0–35)

## 2021-06-29 LAB — MAGNESIUM: Magnesium: 1.6 mg/dL — ABNORMAL LOW (ref 1.7–2.4)

## 2021-06-29 MED ORDER — POTASSIUM CHLORIDE CRYS ER 20 MEQ PO TBCR
20.0000 meq | EXTENDED_RELEASE_TABLET | Freq: Every day | ORAL | Status: DC
  Administered 2021-06-29 – 2021-07-04 (×5): 20 meq via ORAL
  Filled 2021-06-29 (×5): qty 1

## 2021-06-29 MED ORDER — MAGNESIUM OXIDE -MG SUPPLEMENT 400 (240 MG) MG PO TABS
800.0000 mg | ORAL_TABLET | Freq: Every day | ORAL | Status: DC
  Administered 2021-06-29 – 2021-07-07 (×8): 800 mg via ORAL
  Filled 2021-06-29 (×8): qty 2

## 2021-06-29 MED ORDER — MAGNESIUM SULFATE 2 GM/50ML IV SOLN
2.0000 g | Freq: Once | INTRAVENOUS | Status: AC
  Administered 2021-06-29: 2 g via INTRAVENOUS
  Filled 2021-06-29: qty 50

## 2021-06-29 MED ORDER — BISACODYL 5 MG PO TBEC
10.0000 mg | DELAYED_RELEASE_TABLET | Freq: Every day | ORAL | Status: DC
  Administered 2021-06-29 – 2021-07-07 (×8): 10 mg via ORAL
  Filled 2021-06-29 (×8): qty 2

## 2021-06-29 MED ORDER — POTASSIUM CHLORIDE 10 MEQ/100ML IV SOLN
10.0000 meq | INTRAVENOUS | Status: AC
  Administered 2021-06-29 (×4): 10 meq via INTRAVENOUS
  Filled 2021-06-29 (×4): qty 100

## 2021-06-29 NOTE — Progress Notes (Signed)
BLE venous duplex has been completed.  Results can be found under chart review under CV PROC. 06/29/2021 12:14 PM Trumaine Wimer RVT, RDMS

## 2021-06-29 NOTE — Progress Notes (Signed)
Mobility Specialist - Progress Note    06/29/21 1606  Mobility  Activity Ambulated in hall  Level of Assistance Standby assist, set-up cues, supervision of patient - no hands on  Assistive Device Cane  Distance Ambulated (ft) 500 ft  Mobility Ambulated with assistance in hallway  Mobility Response Tolerated well  Mobility performed by Mobility specialist  $Mobility charge 1 Mobility    Pt ambulated 500 ft in hallway using quad cane and pushing IV pole for stabilization. Pt did not c/o of SOB or dizziness and tolerated session well. Pt returned to bed after ambulation with call bell at side.   [place] Specialist [facility] Services Office: [phone] 06/29/21, 4:09 PM

## 2021-06-29 NOTE — Progress Notes (Signed)
PROGRESS NOTE    Wendy Hoffman  JKD:[id number] DOB: 07-30-1966 DOA: 06/17/2021 PCP: Patient, No Pcp Per (Inactive)    Brief Narrative: 55 y.o. female with medical history significant of pancreatic CA, HTN, HLD. Presenting with intractable N/V. She reports that she's had several days of N/V. It's worse with eating. Both solids and liquids come back up within 30 minutes. He home nausea meds do not help. She went to clinic for her normal follow up and chemo session. She started vomiting and could not stop. There was no blood in her vomit. Labs were drawn. She was found to be in AKI. It was recommended that she come to the ED for help. She denies any other aggravating or alleviating factors.    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  Planning for [place] tube by interventional radiology today.   GJ tube placement by IR on 7/18 IV fluids stopped on 06/29/2021. CT abdomen and pelvis-06/26/2021 grossly stable mass within the pancreatic head compatible with known pancreatic cancer.  No significant change in the size and number of known liver metastasis.  #2 e coli uti pansensitive sensitive finished 3 days of IV antibiotics.  #3 odynophagia due to gastritis status post EGD normal esophagus severe gastritis in the prepyloric area nonobstructive mass in the duodenum  biopsy showing adenocarcinoma, biopsy of the stomach with no H. Pylori Continue IV PPI.  #4 AKI creatinine 1,01 from 3.25 improving with IV fluids.  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  #7 HTN blood pressure better after restarting Norvasc. Bp 146/97.  #8 constipation continue Dulcolax   Nutrition Problem: Moderate Malnutrition Etiology: chronic illness, cancer and cancer related treatments     Signs/Symptoms: percent weight loss, energy intake < or equal to 75% for > or equal to 1 month, mild muscle depletion    Interventions: Refer to RD note for recommendations  Estimated body mass index is 38.62 kg/m as calculated from the following:   Height as of this encounter: 5\' 4"  (1.626 m).   Weight as of this encounter: 102.1 kg.  DVT prophylaxis:scd Code Status: Full Family Communication: None at bedside Disposition Plan:  Status is: Inpatient  Remains inpatient appropriate because:IV treatments appropriate due to intensity of illness or inability to take PO  Dispo: The patient is from: Home              Anticipated d/c is to: Home              Patient currently is not medically stable to d/c.   Difficult to place patient No    Consultants: GI and oncology  Procedures: Upper endoscopy 06/19/2021 Antimicrobials: Status post Levaquin and Rocephin and Diflucan Subjective:  She complains of nausea vomiting overnight as diet was progressed No BM for 2 days On  Zofran, Phenergan,  Reglan, Ativan, scopolamine patch PPI and sucralfate She does not want to enroll in hospice She would like to have a PEG tube placed if she is not tolerating diet  Objective: Vitals:   06/28/21 1551 06/28/21 2023 06/29/21 0354 06/29/21 1241  BP: 128/80 (!) 139/91 (!) 167/114 (!) 146/97  Pulse: 69 83 73 76  Resp:  19 18 16   Temp:  97.7 F (36.5 C) 98.2 F (36.8 C) 97.9 F (36.6 C)   TempSrc:  Oral Oral Oral  SpO2: 100% 100% 100% 100%  Weight:      Height:        Intake/Output Summary (Last 24 hours) at 06/29/2021 1303 Last data filed at 06/29/2021 0335 Gross per 24 hour  Intake 2024.2 ml  Output --  Net 2024.2 ml    Filed Weights   06/25/21 1100  Weight: 102.1 kg    Examination:  General exam: Appears calm and comfortable  Respiratory system: Clear to auscultation. Respiratory effort normal. Cardiovascular system: S1 & S2 heard, RRR. No JVD, murmurs, rubs, gallops or clicks. No pedal edema. Gastrointestinal system: Abdomen is nondistended, soft and nontender. No organomegaly or masses felt. Normal bowel sounds heard. Central nervous system: Alert and oriented. No focal neurological deficits. Extremities: Symmetric 5 x 5 power. Skin: No rashes, lesions or ulcers Psychiatry: Judgement and insight appear normal. Mood & affect appropriate.     Data Reviewed: I have personally reviewed following labs and imaging studies  CBC: Recent Labs  Lab 06/23/21 0500 06/24/21 0557 06/27/21 0511  WBC 4.6 4.8 5.0  HGB 9.5* 9.8* 8.9*  HCT 29.3* 30.0* 27.9*  MCV 91.6 90.4 91.5  PLT 137* 150 401    Basic Metabolic Panel: Recent Labs  Lab 06/23/21 0500 06/24/21 0557 06/25/21 1055 06/27/21 0511 06/28/21 0500 06/29/21 0435  NA 141 139 137 140 139 139  K 3.3* 3.9 3.7 2.9* 3.1* 2.9*  CL 110 109 106 108 105 105  CO2 24 23 21* 26 27 27   GLUCOSE 92 132* 121* 99 89 91  BUN <5* <5* 9 9 6  <5*  CREATININE 0.97 0.94 1.11* 1.01* 0.85 0.74  CALCIUM 8.2* 8.4* 8.6* 8.2* 8.5* 8.1*  MG 1.8 1.6*  --   --   --  1.6*  PHOS 2.5 2.7  --   --   --   --     GFR: Estimated Creatinine Clearance: 94.6 mL/min (by C-G formula based on SCr of 0.74 mg/dL). Liver Function Tests: Recent Labs  Lab 06/23/21 0500 06/24/21 0557 06/25/21 1055 06/27/21 0511 06/29/21 0435  AST  --   --  47* 59* 33  ALT  --   --  16 22 21   ALKPHOS  --   --  80 80 83  BILITOT  --   --  0.7 0.6  0.7  PROT  --   --  5.7* 5.2* 5.6*  ALBUMIN 2.7* 2.8* 3.1* 3.0* 3.1*    No results for input(s): LIPASE, AMYLASE in the last 168 hours. No results for input(s): AMMONIA in the last 168 hours. Coagulation Profile: Recent Labs  Lab 06/27/21 0511  INR 1.2    Cardiac Enzymes: No results for input(s): CKTOTAL, CKMB, CKMBINDEX, TROPONINI in the last 168 hours. BNP (last 3 results) No results for input(s): PROBNP in the last 8760 hours. HbA1C: No results for input(s): HGBA1C in the last 72 hours. CBG: Recent Labs  Lab 06/23/21 0856 06/23/21 2151 06/24/21 0021  GLUCAP 91 125* 124*    Lipid Profile:  No results for input(s): CHOL, HDL, LDLCALC, TRIG, CHOLHDL, LDLDIRECT in the last 72 hours. Thyroid Function Tests: No results for input(s): TSH, T4TOTAL, FREET4, T3FREE, THYROIDAB in the last 72 hours. Anemia Panel: No results for input(s): VITAMINB12, FOLATE, FERRITIN, TIBC, IRON, RETICCTPCT in the last 72 hours. Sepsis Labs: No results for input(s): PROCALCITON, LATICACIDVEN in the last 168 hours.  No results found for this or any previous visit (from the past 240 hour(s)).         Radiology Studies: DG THORACOLUMABAR SPINE  Result Date: 06/28/2021 CLINICAL DATA:  Low back pain for 1 day, history of metastatic pancreatic cancer EXAM: THORACOLUMBAR SPINE 1V COMPARISON:  06/26/2021 FINDINGS: Frontal and lateral views of the thoracolumbar spine are obtained. There are no acute or destructive bony lesions. Mild spondylosis is seen within the mid to lower thoracic spine. Severe facet hypertrophic changes are seen within the mid to lower lumbar spine. Sacroiliac joints are normal. Visualized bowel gas pattern is unremarkable. IMPRESSION: 1. Mild mid to lower thoracic spondylosis, with severe lower lumbar facet hypertrophy. 2. No acute or destructive bony lesions. Electronically Signed   By: Randa Ngo M.D.   On: 06/28/2021 15:22   VAS [place] LOWER EXTREMITY VENOUS (DVT)  Result  Date: 06/29/2021  Lower Venous DVT Study Patient Name:  MADESYN AST  Date of Exam:   06/29/2021 Medical Rec #: [id number]        Accession #:    [id number] Date of Birth: 1966-03-06        Patient Gender: F Patient Age:   89E Exam Location:  [facility] Procedure:      VAS [place] LOWER EXTREMITY VENOUS (DVT) Referring Phys: [place] --------------------------------------------------------------------------------  Indications: RLE swelling.  Risk Factors: Chemotherapy Cancer Pancreatic w/ mets. Limitations: Poor ultrasound/tissue interface. Comparison Study: No previous exams Performing Technologist: Jody Hill RVT, RDMS  Examination Guidelines: A complete evaluation includes B-mode imaging, spectral Doppler, color Doppler, and power Doppler as needed of all accessible portions of each vessel. Bilateral testing is considered an integral part of a complete examination. Limited examinations for reoccurring indications may be performed as noted. The reflux portion of the exam is performed with the patient in reverse Trendelenburg.  +---------+---------------+---------+-----------+----------+--------------+ RIGHT    CompressibilityPhasicitySpontaneityPropertiesThrombus Aging +---------+---------------+---------+-----------+----------+--------------+ CFV      Full           Yes      Yes                                 +---------+---------------+---------+-----------+----------+--------------+ SFJ      Full                                                        +---------+---------------+---------+-----------+----------+--------------+ FV Prox  Full           Yes      Yes                                 +---------+---------------+---------+-----------+----------+--------------+ FV Mid   Full           Yes      Yes                                 +---------+---------------+---------+-----------+----------+--------------+  FV DistalFull           Yes      Yes                                  +---------+---------------+---------+-----------+----------+--------------+ PFV      Full                                                        +---------+---------------+---------+-----------+----------+--------------+ POP      Full           Yes      Yes                                 +---------+---------------+---------+-----------+----------+--------------+ PTV      Full                                                        +---------+---------------+---------+-----------+----------+--------------+ PERO     Full                                                        +---------+---------------+---------+-----------+----------+--------------+   +---------+---------------+---------+-----------+----------+--------------+ LEFT     CompressibilityPhasicitySpontaneityPropertiesThrombus Aging +---------+---------------+---------+-----------+----------+--------------+ CFV      Full           Yes      Yes                                 +---------+---------------+---------+-----------+----------+--------------+ SFJ      Full                                                        +---------+---------------+---------+-----------+----------+--------------+ FV Prox  Full           Yes      Yes                                 +---------+---------------+---------+-----------+----------+--------------+ FV Mid   Full           Yes      Yes                                 +---------+---------------+---------+-----------+----------+--------------+ FV DistalFull           Yes      Yes                                 +---------+---------------+---------+-----------+----------+--------------+ PFV      Full                                                        +---------+---------------+---------+-----------+----------+--------------+  POP      Full           Yes      Yes                                  +---------+---------------+---------+-----------+----------+--------------+ PTV      Full                                                        +---------+---------------+---------+-----------+----------+--------------+ PERO     Full                                                        +---------+---------------+---------+-----------+----------+--------------+    Summary: BILATERAL: - No evidence of deep vein thrombosis seen in the lower extremities, bilaterally. - No evidence of superficial venous thrombosis in the lower extremities, bilaterally. -No evidence of popliteal cyst, bilaterally. RIGHT: - Ultrasound characteristics of enlarged lymph nodes are noted in the groin.  LEFT: - Ultrasound characteristics of enlarged lymph nodes noted in the groin.  *See table(s) above for measurements and observations.    Preliminary         Scheduled Meds:  amLODipine  10 mg Oral Daily   Chlorhexidine Gluconate Cloth  6 each Topical Daily   feeding supplement  1 Container Oral TID BM   heparin lock flush  500 Units Intracatheter Q30 days   morphine  30 mg Oral Q12H   pantoprazole (PROTONIX) IV  40 mg Intravenous Q12H   scopolamine  1 patch Transdermal Q72H   sodium chloride flush  10-40 mL Intracatheter Q12H   sucralfate  1 g Oral Q6H   Continuous Infusions:  dextrose 5 % and 0.9% NaCl 100 mL/hr at 06/29/21 0445   erythromycin 500 mg (06/29/21 1259)   potassium chloride 10 mEq (06/29/21 1302)   promethazine (PHENERGAN) injection (IM or IVPB) 200 mL/hr at 06/26/21 1616   vancomycin       LOS: 12 days    Time spent: 40 min    Georgette Shell, MD  06/29/2021, 1:03 PM

## 2021-06-29 NOTE — Progress Notes (Addendum)
HEMATOLOGY-ONCOLOGY PROGRESS NOTE  SUBJECTIVE: She developed recurrent nausea and vomiting after advancing diet.  Continues to have abdominal pain.  She notices swelling in her right lower extremity.  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  The report date is March 23, 2021.   The Multi-Cancer Panel with pancreatitis genes and preliminary pancreatic cancer genes offered by [facility] includes sequencing and/or deletion duplication testing of the following 91 genes: AIP, ALK, APC, ATM, AXIN2,BAP1,  BARD1, BLM, BMPR1A, BRCA1, BRCA2, BRIP1, CASR, CDC73, CDH1, CDK4, CDKN1B, CDKN1C, CDKN2A (p14ARF), CDKN2A (p16INK4a), CEBPA, CFTR, CHEK2, CPA1, CTNNA1, CTRC, DICER1, DIS3L2, EGFR (c.2369C>T, p.Thr790Met variant only), EPCAM (Deletion/duplication testing only), FANCC, FH, FLCN, GATA2, GPC3, GREM1 (Promoter region deletion/duplication testing only), HOXB13 (c.251G>A, p.Gly84Glu), HRAS, KIT, MAX, MEN1, MET, MITF (c.952G>A, p.Glu318Lys variant only), MLH1, MSH2, MSH3, MSH6, MUTYH, NBN, NF1, NF2, NTHL1, PALB2, PALLD, PDGFRA, PHOX2B, PMS2, POLD1, POLE, POT1, PRKAR1A, PRSS1, PTCH1, PTEN, RAD50, RAD51C, RAD51D, RB1, RECQL4, RET, RNF43, RUNX1, SDHAF2, SDHA (sequence changes only), SDHB, SDHC, SDHD, SMAD4, SMARCA4, SMARCB1, SMARCE1, SPINK1, STK11, SUFU, TERC, TERT, TMEM127, TP53, TSC1, TSC2, VHL, WRN and WT1.     PHYSICAL EXAMINATION:  Vitals:   06/28/21 2023  06/29/21 0354  BP: (!) 139/91 (!) 167/114  Pulse: 83 73  Resp: 19 18  Temp: 97.7 F (36.5 C) 98.2 F (36.8 C)  SpO2: 100% 100%   Intake/Output from previous day: 07/17 0701 - 07/18 0700 In: 2024.2 [I.V.:1716.1; IV Piggyback:308.1] Out: -   OROPHARYNX: No thrush ABDOMEN: Tender in the right and mid upper abdomen NEURO: alert & oriented x 3 with fluent speech, the motor exam appears intact in the legs bilaterally Vascular: Trace right lower extremity edema  LABORATORY DATA:  I have reviewed the data as listed CMP Latest Ref Rng & Units 06/29/2021 06/28/2021 06/27/2021  Glucose 70 - 99 mg/dL 91 89 99  BUN 6 - 20 mg/dL <5(L) 6 9  Creatinine 0.44 - 1.00 mg/dL 0.74 0.85 1.01(H)  Sodium 135 - 145 mmol/L 139 139 140  Potassium 3.5 - 5.1 mmol/L 2.9(L) 3.1(L) 2.9(L)  Chloride 98 - 111 mmol/L 105 105 108  CO2 22 - 32 mmol/L '27 27 26  ' Calcium 8.9 - 10.3 mg/dL 8.1(L) 8.5(L) 8.2(L)  Total Protein 6.5 - 8.1 g/dL 5.6(L) - 5.2(L)  Total Bilirubin 0.3 - 1.2 mg/dL 0.7 - 0.6  Alkaline Phos 38 - 126 U/L 83 - 80  AST 15 - 41 U/L 33 - 59(H)  ALT 0 - 44 U/L 21 - 22    Lab Results  Component Value Date   WBC 5.0 06/27/2021   HGB 8.9 (L) 06/27/2021   HCT 27.9 (L) 06/27/2021   MCV 91.5 06/27/2021   PLT 151 06/27/2021   NEUTROABS 3.8 06/22/2021    CT ABDOMEN PELVIS WO CONTRAST  Result Date: 06/17/2021 CLINICAL DATA:  Rule out bowel obstruction. History of pancreas cancer. EXAM: CT ABDOMEN AND PELVIS WITHOUT CONTRAST  TECHNIQUE: Multidetector CT imaging of the abdomen and pelvis was performed following the standard protocol without IV contrast. COMPARISON:  05/23/2021 FINDINGS: Lower chest: No acute abnormality. Hepatobiliary: Within a background of diffuse hepatic steatosis there are multiple liver metastases as noted previously: The dominant lesion within the lateral segment of left hepatic lobe measures 7.8 cm, image 26/2. Previously this measured the same. The dominant lesion within the right  hepatic lobe measures 8.2 cm, image 25/2. Previously 8 cm. Status post cholecystectomy. No biliary dilatation. Pancreas: Head of pancreas mass is suboptimally evaluated due to lack of IV contrast material. Subjectively, this does not appear significantly changed in size when compared with the previous contrast enhanced MRI. Spleen: Normal in size without focal abnormality. Adrenals/Urinary Tract: Normal adrenal glands. No hydronephrosis identified bilaterally. Punctate stone noted within inferior pole of left kidney. Exophytic cyst is again noted arising off the posterior cortex of the interpolar right kidney measuring 1.3 cm. Urinary bladder appears collapsed. Stomach/Bowel: Stomach is nondistended. The appendix is visualized and appears normal. No pathologic dilatation of the large or small bowel loops. No significant bowel wall thickening or surrounding inflammatory fat stranding. Vascular/Lymphatic: Mild aortic atherosclerosis. No aneurysm. No abdominopelvic adenopathy identified. Reproductive: Status post hysterectomy. No adnexal masses.  The Other: No ascites or focal fluid collections. No peritoneal nodularity identified at this time. Musculoskeletal: No acute or significant osseous findings. IMPRESSION: 1. No acute findings within the abdomen or pelvis. No evidence for bowel obstruction. 2. Multiple liver metastases.  Similar to previous exam. 3. Head of pancreas mass is suboptimally evaluated due to lack of IV contrast material. Subjectively, this does not appear significantly changed in size when compared with the previous contrast enhanced MRI. 4. Aortic atherosclerosis. Aortic Atherosclerosis (ICD10-I70.0). Electronically Signed   By: Kerby Moors M.D.   On: 06/17/2021 19:07   DG THORACOLUMABAR SPINE  Result Date: 06/28/2021 CLINICAL DATA:  Low back pain for 1 day, history of metastatic pancreatic cancer EXAM: THORACOLUMBAR SPINE 1V COMPARISON:  06/26/2021 FINDINGS: Frontal and lateral views of the  thoracolumbar spine are obtained. There are no acute or destructive bony lesions. Mild spondylosis is seen within the mid to lower thoracic spine. Severe facet hypertrophic changes are seen within the mid to lower lumbar spine. Sacroiliac joints are normal. Visualized bowel gas pattern is unremarkable. IMPRESSION: 1. Mild mid to lower thoracic spondylosis, with severe lower lumbar facet hypertrophy. 2. No acute or destructive bony lesions. Electronically Signed   By: Randa Ngo M.D.   On: 06/28/2021 15:22   CT ABDOMEN PELVIS W CONTRAST  Result Date: 06/26/2021 CLINICAL DATA:  Worsening nausea and vomiting over the last week, pancreatic cancer undergoing chemotherapy EXAM: CT ABDOMEN AND PELVIS WITH CONTRAST TECHNIQUE: Multidetector CT imaging of the abdomen and pelvis was performed using the standard protocol following bolus administration of intravenous contrast. CONTRAST:  46m OMNIPAQUE IOHEXOL 350 MG/ML SOLN COMPARISON:  06/17/2021, 05/23/2021, 02/16/2021 FINDINGS: Lower chest: No acute pleural or parenchymal lung disease. Central venous catheter tip within the right atrium near the junction with the IVC. No pericardial effusion. Hepatobiliary: Multiple hepatic masses are again identified. Largest lesion within the left lobe liver reference image 27/2 measures 8.0 x 6.1 cm, previously measuring 7.6 x 6.0 cm on MRI 05/23/2021. Largest lesion within the right lobe liver measures up to 8.2 x 4.5 cm on image 23/2, previously measuring 7.6 x 3.7 cm on MRI. Smaller lesions throughout the left lobe liver more inferiorly are grossly unchanged, though less well visualized on CT  compared to MRI. No new lesions are definitively identified. No intrahepatic biliary duct dilation. Gallbladder is surgically absent. Pancreas: The ill-defined mass within the pancreatic head measures up to approximately 2.8 x 3.1 cm reference image 65/2, previously having measured 2.9 x 2.9 cm on MRI 05/23/2021. Mild pancreatic duct  dilation and upstream pancreatic parenchymal atrophy again noted unchanged. Spleen: No focal splenic abnormalities. Mild splenomegaly unchanged. Adrenals/Urinary Tract: No urinary tract calculi or obstructive uropathy within either kidney. The kidneys enhance normally and symmetrically. Small left renal cortical cysts unchanged. The adrenals are unremarkable. Bladder is only minimally distended with no gross abnormalities. Stomach/Bowel: No bowel obstruction or ileus. Normal retrocecal appendix. No bowel wall thickening or inflammatory change. Vascular/Lymphatic: Mild atherosclerosis of the aorta and iliac vessels unchanged. No discrete adenopathy within the abdomen or pelvis. Reproductive: Status post hysterectomy. No adnexal masses. Other: Trace pelvic free fluid. No free intraperitoneal gas. No abdominal wall hernia. Musculoskeletal: No acute or destructive bony lesions. Reconstructed images demonstrate no additional findings. IMPRESSION: 1. Grossly stable mass within the pancreatic head compatible with known pancreatic cancer. 2. No significant change in the size and number of known liver metastases, allowing for differences in technique compared to previous MRI. 3. Trace pelvic free fluid. 4.  Aortic Atherosclerosis (ICD10-I70.0). Electronically Signed   By: Randa Ngo M.D.   On: 06/26/2021 17:01    ASSESSMENT AND PLAN: 1. Pancreas cancer -CT abdomen/pelvis without contrast 02/16/2021-multiple large hypoechoic masses in the patient's liver consistent metastatic disease, ill-defined masslike area in the pancreatic head measuring approximate 4.7 cm, possible filling defect in the right ventricle. -EGD 02/19/2021-large infiltrative mass with bleeding in the second portion of the duodenum, appears to be arising near the ampulla, partially obstructive with friable mucosa.  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 -Flexible sigmoidoscopy 02/19/2021-diverticulosis in the sigmoid  colon, nonbleeding external and internal hemorrhoids -Cardiac CT 02/20/2021-mass in the mid RV attached to the interventricular septum measuring 16 mm x 6 mm and concerning for metastatic tumor -MRI abdomen with/without contrast 02/21/2021-mass in the posterior pancreatic head measuring 4.3 x 3.8 cm with obstruction of the central pancreatic duct and diffuse dilatation up to 6 mm consistent with pancreatic adenocarcinoma, liver lesions the largest mass of the central left lobe measuring 9.9 x 9.2 cm consistent with hepatic metastatic disease,  hepatomegaly. -Ultrasound-guided biopsy of a left liver lesion 02/24/2021-adenocarcinoma, morphologically similar to the duodenal biopsy; microsatellite stable, tumor mutation burden 1 -Negative genetic testing -Palliative radiation to the duodenal mass 02/25/2021-03/06/2021 -Cycle 1 FOLFOX 03/17/2021 -Cycle 2 FOLFOX 04/01/2021 -Cycle 3 FOLFIRINOX 04/14/2021 -Cycle 4 FOLFIRINOX 04/27/2021, Udenyca added -Cycle 5 FOLFIRINOX 05/12/2021, Udenyca -MRI abdomen 05/23/2021-decrease in size of pancreas mass and hepatic lesions, no progressive disease -Cycle 6 FOLFIRINOX 05/27/2021 2.  Iron deficiency anemia-improved -Feraheme 510 mg 02/20/2021 3.  Protein calorie malnutrition 4.  Possible right ventricular filling defect on noncontrast CT scan MRI cardiac morphology 02/21/2021-mass in the mid RV attached to the interventricular septum measures 16 mm x 6 mm.  Mass appears heterogeneous with area of enhancement on LGE imaging.  Mass is concerning for metastatic tumor.  Normal LV size and systolic function.  Normal RV size and systolic function. 5.  Hypertension 6.  History of CVA 7.  Hyperlipidemia 8.  Hypothyroidism 9.  Morbid obesity 10.  Asthma 11.  Migraines 12.  Pain secondary to #1 13.  Port-A-Cath placement 02/25/2021 14.  Hypokalemia 04/01/2021-started a potassium supplement 15.  Hospital admission 06/17/2021-intractable nausea and vomiting EGD 06/19/2021-diffuse  gastritis, nonobstructing  duodenal mass, biopsy of stomach-no malignancy or H. pylori, duodenal mass-adenocarcinoma  Wendy Hoffman has developed recurrent nausea and vomiting after advancing diet.  Recommend proceeding with placement of GJ tube today by IR.  When she is able to tolerate her tube feedings and she has had education regarding management of her tube, she can be considered for discharge to home later this week.  Recommend continuation of as needed antiemetics and pain medication.  We will plan to resume her chemotherapy as an outpatient.  She has developed right lower extremity edema.  Will obtain Doppler ultrasound today to evaluate for DVT.  She is at high risk for DVT given underlying latency.  Recommendations: 1.  Continue antacid regimen. 2.  Continue IV erythromycin per GI. 3.  Proceed with GJ tube placement by IR today. 4.  Dietitian consult after tube placement to help with tube feedings. 5.  Continue MS Contin with Dilaudid and oxycodone as needed for breakthrough pain 6.  We will obtain Doppler ultrasound of right lower extremity.   LOS: 12 days   Mikey Bussing 06/29/21  Ms. Khouri was interviewed and examined.  She continues to have nausea and vomiting.  She will undergo placement of a GJ tube today.  We will plan to resume systemic therapy as an outpatient.  We will follow-up on the CA 19-9 from yesterday.  We will order a Doppler of the right leg.  I was present for greater than 50% of today's visit.  I performed medical decision making.  Julieanne Manson, MD

## 2021-06-30 MED ORDER — PANTOPRAZOLE SODIUM 40 MG PO TBEC
40.0000 mg | DELAYED_RELEASE_TABLET | Freq: Two times a day (BID) | ORAL | Status: DC
  Administered 2021-06-30 – 2021-07-07 (×13): 40 mg via ORAL
  Filled 2021-06-30 (×13): qty 1

## 2021-06-30 NOTE — Progress Notes (Signed)

## 2021-06-30 NOTE — Progress Notes (Signed)
PROGRESS NOTE    Wendy Hoffman  XIP:[id number] DOB: 1966-12-07 DOA: 06/17/2021 PCP: Patient, No Pcp Per (Inactive)    Brief Narrative: 55 y.o. female with medical history significant of pancreatic CA, HTN, HLD. Presenting with intractable N/V. She reports that she's had several days of N/V. It's worse with eating. Both solids and liquids come back up within 30 minutes. He home nausea meds do not help. She went to clinic for her normal follow up and chemo session. She started vomiting and could not stop. There was no blood in her vomit. Labs were drawn. She was found to be in AKI. It was recommended that she come to the ED for help. She denies any other aggravating or alleviating factors.    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  Planning for [place] tube by interventional radiology  IV fluids stopped on 06/29/2021. CT abdomen and pelvis-06/26/2021 grossly stable mass within the pancreatic head compatible with known pancreatic cancer.  No significant change in the size and number of known liver metastasis.  #2 e coli uti pansensitive sensitive finished 3 days of IV antibiotics.  #3 odynophagia due to gastritis status post EGD normal esophagus severe gastritis in the prepyloric area nonobstructive mass in the duodenum biopsy showing adenocarcinoma, biopsy of  the stomach with no H. Pylori Continue IV PPI.  #4 AKI creatinine 1,01 from 3.25 improving with IV fluids.  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  #7 HTN blood pressure better after restarting Norvasc. Bp 146/97.  #8 constipation continue Dulcolax   Nutrition Problem: Moderate Malnutrition Etiology: chronic illness, cancer and cancer related treatments     Signs/Symptoms: percent weight loss, energy intake < or equal to 75% for > or equal to 1 month, mild muscle depletion    Interventions: Refer to RD note for recommendations  Estimated body mass index is 38.62 kg/m as calculated from the following:   Height as of this encounter: 5\' 4"  (1.626 m).   Weight as of this encounter: 102.1 kg.  DVT prophylaxis:scd Code Status: Full Family Communication: None at bedside Disposition Plan:  Status is: Inpatient  Remains inpatient appropriate because:IV treatments appropriate due to intensity of illness or inability to take PO  Dispo: The patient is from: Home              Anticipated d/c is to: Home              Patient currently is not medically stable to d/c.   Difficult to place patient No    Consultants: GI and oncology  Procedures: Upper endoscopy 06/19/2021 Antimicrobials: Status post Levaquin and Rocephin and Diflucan Subjective:  She has been n.p.o. most of the time waiting for GJ tube to be placed she denied any nausea vomiting overnight On  Zofran, Phenergan, Reglan, Ativan, scopolamine  patch PPI and sucralfate She does not want to enroll in hospice She would like to have a PEG tube placed if she is not tolerating diet  Objective: Vitals:   06/29/21 0354 06/29/21 1241 06/29/21 2156 06/30/21 0445  BP: (!) 167/114 (!) 146/97 (!) 152/101 (!) 154/98  Pulse: 73 76 91 80  Resp: 18 16 17 17   Temp: 98.2 F (36.8 C) 97.9 F (36.6 C) 98.6 F (37 C) 98.5 F  (36.9 C)  TempSrc: Oral Oral Oral Oral  SpO2: 100% 100% 100% 99%  Weight:      Height:        Intake/Output Summary (Last 24 hours) at 06/30/2021 1350 Last data filed at 06/30/2021 0436 Gross per 24 hour  Intake 671.61 ml  Output --  Net 671.61 ml    Filed Weights   06/25/21 1100  Weight: 102.1 kg    Examination:  General exam: Appears calm and comfortable  Respiratory system: Clear to auscultation. Respiratory effort normal. Cardiovascular system: S1 & S2 heard, RRR. No JVD, murmurs, rubs, gallops or clicks. No pedal edema. Gastrointestinal system: Abdomen is nondistended, soft and nontender. No organomegaly or masses felt. Normal bowel sounds heard. Central nervous system: Alert and oriented. No focal neurological deficits. Extremities: Symmetric 5 x 5 power. Skin: No rashes, lesions or ulcers Psychiatry: Judgement and insight appear normal. Mood & affect appropriate.     Data Reviewed: I have personally reviewed following labs and imaging studies  CBC: Recent Labs  Lab 06/24/21 0557 06/27/21 0511  WBC 4.8 5.0  HGB 9.8* 8.9*  HCT 30.0* 27.9*  MCV 90.4 91.5  PLT 150 254    Basic Metabolic Panel: Recent Labs  Lab 06/24/21 0557 06/25/21 1055 06/27/21 0511 06/28/21 0500 06/29/21 0435  NA 139 137 140 139 139  K 3.9 3.7 2.9* 3.1* 2.9*  CL 109 106 108 105 105  CO2 23 21* 26 27 27   GLUCOSE 132* 121* 99 89 91  BUN <5* 9 9 6  <5*  CREATININE 0.94 1.11* 1.01* 0.85 0.74  CALCIUM 8.4* 8.6* 8.2* 8.5* 8.1*  MG 1.6*  --   --   --  1.6*  PHOS 2.7  --   --   --   --     GFR: Estimated Creatinine Clearance: 94.6 mL/min (by C-G formula based on SCr of 0.74 mg/dL). Liver Function Tests: Recent Labs  Lab 06/24/21 0557 06/25/21 1055 06/27/21 0511 06/29/21 0435  AST  --  47* 59* 33  ALT  --  16 22 21   ALKPHOS  --  80 80 83  BILITOT  --  0.7 0.6 0.7  PROT  --  5.7* 5.2* 5.6*  ALBUMIN 2.8* 3.1* 3.0* 3.1*    No results for input(s): LIPASE, AMYLASE in the last  168 hours. No results for input(s): AMMONIA in the last 168 hours. Coagulation Profile: Recent Labs  Lab 06/27/21 0511  INR 1.2    Cardiac Enzymes: No results for input(s): CKTOTAL, CKMB, CKMBINDEX, TROPONINI in the last 168 hours. BNP (last 3 results) No results for input(s): PROBNP in the last 8760 hours. HbA1C: No results for input(s): HGBA1C in the last 72 hours. CBG: Recent Labs  Lab 06/23/21 2151 06/24/21 0021  GLUCAP 125* 124*    Lipid Profile: No results for input(s): CHOL, HDL, LDLCALC, TRIG, CHOLHDL, LDLDIRECT in the last 72 hours. Thyroid Function Tests: No results for input(s): TSH, T4TOTAL, FREET4, T3FREE, THYROIDAB in the last 72 hours. Anemia Panel: No results for input(s): VITAMINB12,  FOLATE, FERRITIN, TIBC, IRON, RETICCTPCT in the last 72 hours. Sepsis Labs: No results for input(s): PROCALCITON, LATICACIDVEN in the last 168 hours.  No results found for this or any previous visit (from the past 240 hour(s)).         Radiology Studies: DG THORACOLUMABAR SPINE  Result Date: 06/28/2021 CLINICAL DATA:  Low back pain for 1 day, history of metastatic pancreatic cancer EXAM: THORACOLUMBAR SPINE 1V COMPARISON:  06/26/2021 FINDINGS: Frontal and lateral views of the thoracolumbar spine are obtained. There are no acute or destructive bony lesions. Mild spondylosis is seen within the mid to lower thoracic spine. Severe facet hypertrophic changes are seen within the mid to lower lumbar spine. Sacroiliac joints are normal. Visualized bowel gas pattern is unremarkable. IMPRESSION: 1. Mild mid to lower thoracic spondylosis, with severe lower lumbar facet hypertrophy. 2. No acute or destructive bony lesions. Electronically Signed   By: Randa Ngo M.D.   On: 06/28/2021 15:22   VAS [place] LOWER EXTREMITY VENOUS (DVT)  Result Date: 06/29/2021  Lower Venous DVT Study Patient Name:  JENAYE RICKERT  Date of Exam:   06/29/2021 Medical Rec #: [id number]        Accession #:     [id number] Date of Birth: Jun 17, 1966        Patient Gender: F Patient Age:   89D Exam Location:  [facility] Procedure:      VAS [place] LOWER EXTREMITY VENOUS (DVT) Referring Phys: Wilson --------------------------------------------------------------------------------  Indications: RLE swelling.  Risk Factors: Chemotherapy Cancer Pancreatic w/ mets. Limitations: Poor ultrasound/tissue interface. Comparison Study: No previous exams Performing Technologist: Jody Hill RVT, RDMS  Examination Guidelines: A complete evaluation includes B-mode imaging, spectral Doppler, color Doppler, and power Doppler as needed of all accessible portions of each vessel. Bilateral testing is considered an integral part of a complete examination. Limited examinations for reoccurring indications may be performed as noted. The reflux portion of the exam is performed with the patient in reverse Trendelenburg.  +---------+---------------+---------+-----------+----------+--------------+ RIGHT    CompressibilityPhasicitySpontaneityPropertiesThrombus Aging +---------+---------------+---------+-----------+----------+--------------+ CFV      Full           Yes      Yes                                 +---------+---------------+---------+-----------+----------+--------------+ SFJ      Full                                                        +---------+---------------+---------+-----------+----------+--------------+ FV Prox  Full           Yes      Yes                                 +---------+---------------+---------+-----------+----------+--------------+ FV Mid   Full           Yes      Yes                                 +---------+---------------+---------+-----------+----------+--------------+ FV DistalFull           Yes      Yes                                 +---------+---------------+---------+-----------+----------+--------------+  PFV      Full                                                         +---------+---------------+---------+-----------+----------+--------------+ POP      Full           Yes      Yes                                 +---------+---------------+---------+-----------+----------+--------------+ PTV      Full                                                        +---------+---------------+---------+-----------+----------+--------------+ PERO     Full                                                        +---------+---------------+---------+-----------+----------+--------------+   +---------+---------------+---------+-----------+----------+--------------+ LEFT     CompressibilityPhasicitySpontaneityPropertiesThrombus Aging +---------+---------------+---------+-----------+----------+--------------+ CFV      Full           Yes      Yes                                 +---------+---------------+---------+-----------+----------+--------------+ SFJ      Full                                                        +---------+---------------+---------+-----------+----------+--------------+ FV Prox  Full           Yes      Yes                                 +---------+---------------+---------+-----------+----------+--------------+ FV Mid   Full           Yes      Yes                                 +---------+---------------+---------+-----------+----------+--------------+ FV DistalFull           Yes      Yes                                 +---------+---------------+---------+-----------+----------+--------------+ PFV      Full                                                        +---------+---------------+---------+-----------+----------+--------------+  POP      Full           Yes      Yes                                 +---------+---------------+---------+-----------+----------+--------------+ PTV      Full                                                         +---------+---------------+---------+-----------+----------+--------------+ PERO     Full                                                        +---------+---------------+---------+-----------+----------+--------------+     Summary: BILATERAL: - No evidence of deep vein thrombosis seen in the lower extremities, bilaterally. - No evidence of superficial venous thrombosis in the lower extremities, bilaterally. -No evidence of popliteal cyst, bilaterally. RIGHT: - Ultrasound characteristics of enlarged lymph nodes are noted in the groin.  LEFT: - Ultrasound characteristics of enlarged lymph nodes noted in the groin.  *See table(s) above for measurements and observations. Electronically signed by Ruta Hinds MD on 06/29/2021 at 7:52:11 PM.    Final         Scheduled Meds:  amLODipine  10 mg Oral Daily   bisacodyl  10 mg Oral Daily   Chlorhexidine Gluconate Cloth  6 each Topical Daily   feeding supplement  1 Container Oral TID BM   heparin lock flush  500 Units Intracatheter Q30 days   magnesium oxide  800 mg Oral Daily   morphine  30 mg Oral Q12H   pantoprazole  40 mg Oral BID   potassium chloride  20 mEq Oral Daily   scopolamine  1 patch Transdermal Q72H   sodium chloride flush  10-40 mL Intracatheter Q12H   sucralfate  1 g Oral Q6H   Continuous Infusions:  erythromycin 500 mg (06/30/21 1207)   promethazine (PHENERGAN) injection (IM or IVPB) 200 mL/hr at 06/26/21 1616     LOS: 13 days    Time spent: 40 min    Georgette Shell, MD  06/30/2021, 1:50 PM

## 2021-06-30 NOTE — Progress Notes (Signed)
Nutrition Brief Note  RD consulted for TF initiation and management. Plan is for IR to place G-J tube. Once tube is ready to use, will place TF orders.  Monitor magnesium, potassium, and phosphorus daily for at least 3 days, MD to replete as needed, as pt is at risk for refeeding syndrome.  TF recommendations: Initiate Osmolite 1.5 @ 20 ml/hr, advance by 10 ml every 12 hours to goal rate of 55 ml/hr.   Wendy Bibles, MS, RD, LDN Inpatient Clinical Dietitian Contact information available via Amion

## 2021-06-30 NOTE — Progress Notes (Signed)
IR was requested for GJ tube placement.   The procedure was tentatively scheduled for today.  After reviewing imaging, Dr. Serafina Royals recommended patient to receive oral barium the day before procedure or barium enema to move colon away from stomach.   Discussed with patient, patient decided to proceed with barium enema.  The procedure is tentatively scheduled for tomorrow pending IR schedule.  Made npo at midnight.  RN notified.    Please call IR for questions and concerns.    Armando Gang Murel Shenberger PA-C 06/30/2021 2:48 PM

## 2021-06-30 NOTE — Progress Notes (Signed)
Mobility Specialist - Progress Note    06/30/21 1200  Mobility  Activity Ambulated in hall  Level of Assistance Modified independent, requires aide device or extra time  Assistive Device Other (Comment) (IV Pole)  Distance Ambulated (ft) 500 ft  Mobility Ambulated with assistance in hallway  Mobility Response Tolerated well  Mobility performed by Mobility specialist  $Mobility charge 1 Mobility   Pt eagerly ambulated 500 ft in hallway while pushing IV pole. Pt did not c/o of SOB, pain, or dizziness, and tolerated session well. Pt returned to bed after ambulation with call bell at side.   [place] Specialist Acute [facility] Services Phone: [phone] 06/30/21, 12:34 PM

## 2021-07-01 ENCOUNTER — Inpatient Hospital Stay (HOSPITAL_COMMUNITY): Payer: Medicaid Other

## 2021-07-01 DIAGNOSIS — N390 Urinary tract infection, site not specified: Secondary | ICD-10-CM

## 2021-07-01 DIAGNOSIS — B9620 Unspecified Escherichia coli [E. coli] as the cause of diseases classified elsewhere: Secondary | ICD-10-CM

## 2021-07-01 DIAGNOSIS — C250 Malignant neoplasm of head of pancreas: Secondary | ICD-10-CM | POA: Diagnosis not present

## 2021-07-01 HISTORY — PX: IR GASTROSTOMY TUBE MOD SED: IMG625

## 2021-07-01 MED ORDER — MIDAZOLAM HCL 2 MG/2ML IJ SOLN
INTRAMUSCULAR | Status: AC | PRN
  Administered 2021-07-01 (×4): 0.5 mg via INTRAVENOUS
  Administered 2021-07-01: 1 mg via INTRAVENOUS

## 2021-07-01 MED ORDER — FENTANYL CITRATE (PF) 100 MCG/2ML IJ SOLN
INTRAMUSCULAR | Status: AC
  Filled 2021-07-01: qty 2

## 2021-07-01 MED ORDER — MIDAZOLAM HCL 2 MG/2ML IJ SOLN
INTRAMUSCULAR | Status: AC
  Filled 2021-07-01: qty 2

## 2021-07-01 MED ORDER — POTASSIUM CHLORIDE IN NACL 40-0.9 MEQ/L-% IV SOLN
INTRAVENOUS | Status: DC
  Filled 2021-07-01 (×9): qty 1000

## 2021-07-01 MED ORDER — GLUCAGON HCL RDNA (DIAGNOSTIC) 1 MG IJ SOLR
INTRAMUSCULAR | Status: AC | PRN
  Administered 2021-07-01: 1 mg via INTRAVENOUS

## 2021-07-01 MED ORDER — OXYCODONE HCL 5 MG/5ML PO SOLN
10.0000 mg | ORAL | Status: DC | PRN
  Administered 2021-07-03 – 2021-07-05 (×9): 20 mg via ORAL
  Administered 2021-07-05 – 2021-07-07 (×5): 10 mg via ORAL
  Filled 2021-07-01 (×4): qty 20
  Filled 2021-07-01 (×2): qty 10
  Filled 2021-07-01 (×3): qty 20
  Filled 2021-07-01: qty 10
  Filled 2021-07-01: qty 20
  Filled 2021-07-01 (×2): qty 10
  Filled 2021-07-01: qty 20
  Filled 2021-07-01: qty 10

## 2021-07-01 MED ORDER — BACITRACIN-NEOMYCIN-POLYMYXIN 400-5-5000 EX OINT
1.0000 "application " | TOPICAL_OINTMENT | Freq: Every day | CUTANEOUS | Status: DC
  Administered 2021-07-02 – 2021-07-07 (×6): 1 via TOPICAL
  Filled 2021-07-01 (×5): qty 1

## 2021-07-01 MED ORDER — LIDOCAINE HCL 1 % IJ SOLN
INTRAMUSCULAR | Status: AC
  Filled 2021-07-01: qty 20

## 2021-07-01 MED ORDER — GLUCAGON HCL RDNA (DIAGNOSTIC) 1 MG IJ SOLR
INTRAMUSCULAR | Status: AC
  Filled 2021-07-01: qty 1

## 2021-07-01 MED ORDER — FENTANYL CITRATE (PF) 100 MCG/2ML IJ SOLN
INTRAMUSCULAR | Status: AC | PRN
  Administered 2021-07-01: 25 ug via INTRAVENOUS
  Administered 2021-07-01: 50 ug via INTRAVENOUS
  Administered 2021-07-01 (×3): 25 ug via INTRAVENOUS

## 2021-07-01 MED ORDER — VANCOMYCIN HCL IN DEXTROSE 1-5 GM/200ML-% IV SOLN
INTRAVENOUS | Status: AC
  Filled 2021-07-01: qty 200

## 2021-07-01 MED ORDER — MAGNESIUM SULFATE 2 GM/50ML IV SOLN
2.0000 g | Freq: Once | INTRAVENOUS | Status: AC
  Administered 2021-07-01: 2 g via INTRAVENOUS
  Filled 2021-07-01: qty 50

## 2021-07-01 MED ORDER — VANCOMYCIN HCL IN DEXTROSE 1-5 GM/200ML-% IV SOLN
INTRAVENOUS | Status: AC | PRN
  Administered 2021-07-01: 1000 mg via INTRAVENOUS

## 2021-07-01 MED ORDER — MORPHINE SULFATE ER 15 MG PO TBCR
60.0000 mg | EXTENDED_RELEASE_TABLET | Freq: Two times a day (BID) | ORAL | Status: DC
  Administered 2021-07-01 – 2021-07-07 (×12): 60 mg via ORAL
  Filled 2021-07-01 (×12): qty 4

## 2021-07-01 MED ORDER — IOHEXOL 300 MG/ML  SOLN
25.0000 mL | Freq: Once | INTRAMUSCULAR | Status: AC | PRN
  Administered 2021-07-01: 10 mL

## 2021-07-01 NOTE — Plan of Care (Signed)
Patient is alert and oriented x4. Pain managed with prn IV dilaudid, patient cannot recall the last time she had a bowel movement. Patient with poor po intake and plan for placement of G-tube. Patient is NPO. Receiving IV antibiotics. Problem: Education: Goal: Knowledge of General Education information will improve Description: Including pain rating scale, medication(s)/side effects and non-pharmacologic comfort measures Outcome: Progressing   Problem: Health Behavior/Discharge Planning: Goal: Ability to manage health-related needs will improve Outcome: Progressing   Problem: Clinical Measurements: Goal: Ability to maintain clinical measurements within normal limits will improve Outcome: Progressing Goal: Will remain free from infection Outcome: Progressing Goal: Diagnostic test results will improve Outcome: Progressing Goal: Cardiovascular complication will be avoided Outcome: Progressing   Problem: Activity: Goal: Risk for activity intolerance will decrease Outcome: Progressing   Problem: Coping: Goal: Level of anxiety will decrease Outcome: Progressing   Problem: Elimination: Goal: Will not experience complications related to urinary retention Outcome: Progressing   Problem: Pain Managment: Goal: General experience of comfort will improve Outcome: Progressing   Problem: Safety: Goal: Ability to remain free from injury will improve Outcome: Progressing   Problem: Skin Integrity: Goal: Risk for impaired skin integrity will decrease Outcome: Progressing   Problem: Education: Goal: Knowledge of the prescribed therapeutic regimen will improve Outcome: Progressing   Problem: Activity: Goal: Ability to implement measures to reduce episodes of fatigue will improve Outcome: Progressing   Problem: Coping: Goal: Ability to identify and develop effective coping behavior will improve Outcome: Progressing   Problem: Nutrition: Goal: Adequate nutrition will be  maintained Outcome: Not Progressing   Problem: Elimination: Goal: Will not experience complications related to bowel motility Outcome: Not Progressing   Problem: Bowel/Gastric: Goal: Will not experience complications related to bowel motility Outcome: Not Progressing   Problem: Nutritional: Goal: Maintenance of adequate nutrition will improve Outcome: Not Progressing

## 2021-07-01 NOTE — Progress Notes (Signed)
PROGRESS NOTE  Wendy Hoffman QIO:[id number] DOB: 11/01/1966 DOA: 06/17/2021 PCP: Patient, No Pcp Per (Inactive)  HPI/Recap of past 63 hours: 55 year old female past medical history of pancreatic cancer, hypertension, morbid obesity and COPD admitted on 7/6 for intractable nausea/vomiting and found to be in acute kidney injury.  Despite hydration, attempts to advance diet led to recurrence of nausea and vomiting.  Plan is for [place] tube by interventional radiology today.  Patient also found to have a mild UTI and completed 3 days of antibiotics.  Mildly nauseated, seen prior to tube placement  Assessment/Plan: Principal Problem:   Nausea and vomiting: Secondary to invasive adenocarcinoma.  For GJ tube today by interventional radiology.  Start Osmolite after. Active Problems:   Migraine   Hypothyroidism: Continue Synthroid    Tobacco abuse: Nicotine patch.    COPD (chronic obstructive pulmonary disease) ([place]): Stable.    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  Status post palliative radiation.  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    Iron deficiency anemia due to chronic blood loss: Status post iron Feraheme      Hypokalemia/hypomagnesemia: Secondary to nausea and vomiting.  Replacing.    ARF (acute renal failure) (HCC)/AKI: Secondary to nausea and vomiting and poor p.o. intake.  Resolved with IV fluids.    Malnutrition of moderate degree: Nutrition Status: Nutrition Problem: Moderate Malnutrition Etiology: chronic illness, cancer and cancer related treatments Signs/Symptoms: percent weight loss, energy intake < or equal to 75% for > or equal to 1 month, mild muscle depletion: Seen by nutrition.  Plan is for Osmolite tube feeds once GJ tube in place  Morbid obesity: Meets criteria with BMI greater than 35+ hypertension and hyperlipidemia  E. coli UTI: Status post 3 days  antibiotics.  Essential hypertension: Continue home medications.  Oral  Code Status: Full code  Family Communication: Left message for family  Disposition Plan: Potential discharge once she is tolerating tube feedings and GJ tube in place with home health set up   Consultants: Oncology Interventional radiology Gastroenterology  Procedures: Planned [place] tube 7/20 EGD done 7/8  Antimicrobials: Completed course of Rocephin Completed course of Diflucan  DVT prophylaxis: SCDs  Level of care: Med-Surg   Objective: Vitals:   06/30/21 2020 07/01/21 0512  BP: 139/85 (!) 151/101  Pulse: 82 86  Resp: 18 18  Temp: 98.2 F (36.8 C) 98.3 F (36.8 C)  SpO2: 100% 100%    Intake/Output Summary (Last 24 hours) at 07/01/2021 0955 Last data filed at 07/01/2021 0600 Gross per 24 hour  Intake 240 ml  Output --  Net 240 ml   Filed Weights   06/25/21 1100  Weight: 102.1 kg   Body mass index is 38.62 kg/m.  Exam:  General: Alert and oriented x3, mild distress secondary to nausea and fatigue Cardiovascular: Regular rate and rhythm, S1-S2 Respiratory: Clear to auscultation bilaterally Abdomen: Soft, mild distention, nonspecific tenderness, hypoactive bowel sounds Musculoskeletal: No clubbing or cyanosis or edema Skin: No skin breaks, tears or lesions Psychiatry: Appropriate, no evidence of psychoses Neurology: No focal deficits   Data Reviewed: CBC: Recent Labs  Lab 06/27/21 0511  WBC 5.0  HGB 8.9*  HCT 27.9*  MCV 91.5  PLT 324   Basic Metabolic Panel: Recent Labs  Lab 06/25/21 1055 06/27/21 0511 06/28/21 0500 06/29/21 0435  NA 137 140 139 139  K 3.7 2.9* 3.1* 2.9*  CL 106 108 105 105  CO2 21* 26 27  27  GLUCOSE 121* 99 89 91  BUN 9 9 6  <5*  CREATININE 1.11* 1.01* 0.85 0.74  CALCIUM 8.6* 8.2* 8.5* 8.1*  MG  --   --   --  1.6*   GFR: Estimated Creatinine Clearance: 94.6 mL/min (by C-G formula based on SCr of 0.74 mg/dL). Liver Function Tests: Recent  Labs  Lab 06/25/21 1055 06/27/21 0511 06/29/21 0435  AST 47* 59* 33  ALT 16 22 21   ALKPHOS 80 80 83  BILITOT 0.7 0.6 0.7  PROT 5.7* 5.2* 5.6*  ALBUMIN 3.1* 3.0* 3.1*   No results for input(s): LIPASE, AMYLASE in the last 168 hours. No results for input(s): AMMONIA in the last 168 hours. Coagulation Profile: Recent Labs  Lab 06/27/21 0511  INR 1.2   Cardiac Enzymes: No results for input(s): CKTOTAL, CKMB, CKMBINDEX, TROPONINI in the last 168 hours. BNP (last 3 results) No results for input(s): PROBNP in the last 8760 hours. HbA1C: No results for input(s): HGBA1C in the last 72 hours. CBG: No results for input(s): GLUCAP in the last 168 hours. Lipid Profile: No results for input(s): CHOL, HDL, LDLCALC, TRIG, CHOLHDL, LDLDIRECT in the last 72 hours. Thyroid Function Tests: No results for input(s): TSH, T4TOTAL, FREET4, T3FREE, THYROIDAB in the last 72 hours. Anemia Panel: No results for input(s): VITAMINB12, FOLATE, FERRITIN, TIBC, IRON, RETICCTPCT in the last 72 hours. Urine analysis:    Component Value Date/Time   COLORURINE YELLOW 06/18/2021 1000   APPEARANCEUR CLOUDY (A) 06/18/2021 1000   LABSPEC 1.011 06/18/2021 1000   PHURINE 6.0 06/18/2021 1000   GLUCOSEU NEGATIVE 06/18/2021 1000   HGBUR MODERATE (A) 06/18/2021 1000   BILIRUBINUR NEGATIVE 06/18/2021 1000   KETONESUR NEGATIVE 06/18/2021 1000   PROTEINUR 30 (A) 06/18/2021 1000   UROBILINOGEN 0.2 09/08/2014 1800   NITRITE NEGATIVE 06/18/2021 1000   LEUKOCYTESUR LARGE (A) 06/18/2021 1000   Sepsis Labs: @LABRCNTIP (procalcitonin:4,lacticidven:4)  )No results found for this or any previous visit (from the past 240 hour(s)).    Studies: No results found.  Scheduled Meds:  amLODipine  10 mg Oral Daily   bisacodyl  10 mg Oral Daily   Chlorhexidine Gluconate Cloth  6 each Topical Daily   feeding supplement  1 Container Oral TID BM   heparin lock flush  500 Units Intracatheter Q30 days   magnesium oxide  800  mg Oral Daily   morphine  60 mg Oral Q12H   pantoprazole  40 mg Oral BID   potassium chloride  20 mEq Oral Daily   scopolamine  1 patch Transdermal Q72H   sodium chloride flush  10-40 mL Intracatheter Q12H   sucralfate  1 g Oral Q6H    Continuous Infusions:  promethazine (PHENERGAN) injection (IM or IVPB) 200 mL/hr at 06/26/21 1616     LOS: 14 days     Annita Brod, MD [facility]   07/01/2021, 9:55 AM

## 2021-07-01 NOTE — Progress Notes (Signed)
HEMATOLOGY-ONCOLOGY PROGRESS NOTE  SUBJECTIVE: Abdominal pain persists.  She is taking frequent breakthrough medication.  Continues to vomit.  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  The report date is March 23, 2021.   The Multi-Cancer Panel with pancreatitis genes and preliminary pancreatic cancer genes offered by [facility] includes sequencing and/or deletion duplication testing of the following 91 genes: AIP, ALK, APC, ATM, AXIN2,BAP1,  BARD1, BLM, BMPR1A, BRCA1, BRCA2, BRIP1, CASR, CDC73, CDH1, CDK4, CDKN1B, CDKN1C, CDKN2A (p14ARF), CDKN2A (p16INK4a), CEBPA, CFTR, CHEK2, CPA1, CTNNA1, CTRC, DICER1, DIS3L2, EGFR (c.2369C>T, p.Thr790Met variant only), EPCAM (Deletion/duplication testing only), FANCC, FH, FLCN, GATA2, GPC3, GREM1 (Promoter region deletion/duplication testing only), HOXB13 (c.251G>A, p.Gly84Glu), HRAS, KIT, MAX, MEN1, MET, MITF (c.952G>A, p.Glu318Lys variant only), MLH1, MSH2, MSH3, MSH6, MUTYH, NBN, NF1, NF2, NTHL1, PALB2, PALLD, PDGFRA, PHOX2B, PMS2, POLD1, POLE, POT1, PRKAR1A, PRSS1, PTCH1, PTEN, RAD50, RAD51C, RAD51D, RB1, RECQL4, RET, RNF43, RUNX1, SDHAF2, SDHA (sequence changes only), SDHB, SDHC, SDHD, SMAD4, SMARCA4, SMARCB1, SMARCE1, SPINK1, STK11, SUFU, TERC, TERT, TMEM127, TP53, TSC1, TSC2, VHL, WRN and WT1.     PHYSICAL EXAMINATION:  Vitals:   06/30/21 2020 07/01/21 0512  BP: 139/85 (!) 151/101  Pulse: 82 86   Resp: 18 18  Temp: 98.2 F (36.8 C) 98.3 F (36.8 C)  SpO2: 100% 100%   Intake/Output from previous day: 07/19 0701 - 07/20 0700 In: 240 [P.O.:240] Out: -   OROPHARYNX: No thrush ABDOMEN: fullness and tenderness in the mid and rt. Upper abdomen  Vascular: No leg edema LABORATORY DATA:  I have reviewed the data as listed CMP Latest Ref Rng & Units 06/29/2021 06/28/2021 06/27/2021  Glucose 70 - 99 mg/dL 91 89 99  BUN 6 - 20 mg/dL <5(L) 6 9  Creatinine 0.44 - 1.00 mg/dL 0.74 0.85 1.01(H)  Sodium 135 - 145 mmol/L 139 139 140  Potassium 3.5 - 5.1 mmol/L 2.9(L) 3.1(L) 2.9(L)  Chloride 98 - 111 mmol/L 105 105 108  CO2 22 - 32 mmol/L _0 Calcium 8.9 - 10.3 mg/dL 8.1(L) 8.5(L) 8.2(L)  Total Protein 6.5 - 8.1 g/dL 5.6(L) - 5.2(L)  Total Bilirubin 0.3 - 1.2 mg/dL 0.7 - 0.6  Alkaline Phos 38 - 126 U/L 83 - 80  AST 15 - 41 U/L 33 - 59(H)  ALT 0 - 44 U/L 21 - 22    Lab Results  Component Value Date   WBC 5.0 06/27/2021   HGB 8.9 (L) 06/27/2021   HCT 27.9 (L) 06/27/2021   MCV 91.5 06/27/2021   PLT 151 06/27/2021   NEUTROABS 3.8 06/22/2021    CT ABDOMEN PELVIS WO CONTRAST  Result Date: 06/17/2021 CLINICAL DATA:  Rule out bowel obstruction. History of pancreas cancer. EXAM: CT ABDOMEN AND PELVIS WITHOUT CONTRAST TECHNIQUE: Multidetector CT imaging of the abdomen and pelvis was performed following the standard protocol without IV contrast. COMPARISON:  05/23/2021 FINDINGS: Lower chest: No acute abnormality. Hepatobiliary: Within a background  of diffuse hepatic steatosis there are multiple liver metastases as noted previously: The dominant lesion within the lateral segment of left hepatic lobe measures 7.8 cm, image 26/2. Previously this measured the same. The dominant lesion within the right hepatic lobe measures 8.2 cm, image 25/2. Previously 8 cm. Status post cholecystectomy. No biliary dilatation. Pancreas: Head of pancreas mass is suboptimally evaluated due to lack of IV contrast  material. Subjectively, this does not appear significantly changed in size when compared with the previous contrast enhanced MRI. Spleen: Normal in size without focal abnormality. Adrenals/Urinary Tract: Normal adrenal glands. No hydronephrosis identified bilaterally. Punctate stone noted within inferior pole of left kidney. Exophytic cyst is again noted arising off the posterior cortex of the interpolar right kidney measuring 1.3 cm. Urinary bladder appears collapsed. Stomach/Bowel: Stomach is nondistended. The appendix is visualized and appears normal. No pathologic dilatation of the large or small bowel loops. No significant bowel wall thickening or surrounding inflammatory fat stranding. Vascular/Lymphatic: Mild aortic atherosclerosis. No aneurysm. No abdominopelvic adenopathy identified. Reproductive: Status post hysterectomy. No adnexal masses.  The Other: No ascites or focal fluid collections. No peritoneal nodularity identified at this time. Musculoskeletal: No acute or significant osseous findings. IMPRESSION: 1. No acute findings within the abdomen or pelvis. No evidence for bowel obstruction. 2. Multiple liver metastases.  Similar to previous exam. 3. Head of pancreas mass is suboptimally evaluated due to lack of IV contrast material. Subjectively, this does not appear significantly changed in size when compared with the previous contrast enhanced MRI. 4. Aortic atherosclerosis. Aortic Atherosclerosis (ICD10-I70.0). Electronically Signed   By: Kerby Moors M.D.   On: 06/17/2021 19:07   DG THORACOLUMABAR SPINE  Result Date: 06/28/2021 CLINICAL DATA:  Low back pain for 1 day, history of metastatic pancreatic cancer EXAM: THORACOLUMBAR SPINE 1V COMPARISON:  06/26/2021 FINDINGS: Frontal and lateral views of the thoracolumbar spine are obtained. There are no acute or destructive bony lesions. Mild spondylosis is seen within the mid to lower thoracic spine. Severe facet hypertrophic changes are seen  within the mid to lower lumbar spine. Sacroiliac joints are normal. Visualized bowel gas pattern is unremarkable. IMPRESSION: 1. Mild mid to lower thoracic spondylosis, with severe lower lumbar facet hypertrophy. 2. No acute or destructive bony lesions. Electronically Signed   By: Randa Ngo M.D.   On: 06/28/2021 15:22   CT ABDOMEN PELVIS W CONTRAST  Result Date: 06/26/2021 CLINICAL DATA:  Worsening nausea and vomiting over the last week, pancreatic cancer undergoing chemotherapy EXAM: CT ABDOMEN AND PELVIS WITH CONTRAST TECHNIQUE: Multidetector CT imaging of the abdomen and pelvis was performed using the standard protocol following bolus administration of intravenous contrast. CONTRAST:  62m OMNIPAQUE IOHEXOL 350 MG/ML SOLN COMPARISON:  06/17/2021, 05/23/2021, 02/16/2021 FINDINGS: Lower chest: No acute pleural or parenchymal lung disease. Central venous catheter tip within the right atrium near the junction with the IVC. No pericardial effusion. Hepatobiliary: Multiple hepatic masses are again identified. Largest lesion within the left lobe liver reference image 27/2 measures 8.0 x 6.1 cm, previously measuring 7.6 x 6.0 cm on MRI 05/23/2021. Largest lesion within the right lobe liver measures up to 8.2 x 4.5 cm on image 23/2, previously measuring 7.6 x 3.7 cm on MRI. Smaller lesions throughout the left lobe liver more inferiorly are grossly unchanged, though less well visualized on CT compared to MRI. No new lesions are definitively identified. No intrahepatic biliary duct dilation. Gallbladder is surgically absent. Pancreas: The ill-defined mass within the pancreatic head measures up to approximately 2.8  x 3.1 cm reference image 65/2, previously having measured 2.9 x 2.9 cm on MRI 05/23/2021. Mild pancreatic duct dilation and upstream pancreatic parenchymal atrophy again noted unchanged. Spleen: No focal splenic abnormalities. Mild splenomegaly unchanged. Adrenals/Urinary Tract: No urinary tract calculi  or obstructive uropathy within either kidney. The kidneys enhance normally and symmetrically. Small left renal cortical cysts unchanged. The adrenals are unremarkable. Bladder is only minimally distended with no gross abnormalities. Stomach/Bowel: No bowel obstruction or ileus. Normal retrocecal appendix. No bowel wall thickening or inflammatory change. Vascular/Lymphatic: Mild atherosclerosis of the aorta and iliac vessels unchanged. No discrete adenopathy within the abdomen or pelvis. Reproductive: Status post hysterectomy. No adnexal masses. Other: Trace pelvic free fluid. No free intraperitoneal gas. No abdominal wall hernia. Musculoskeletal: No acute or destructive bony lesions. Reconstructed images demonstrate no additional findings. IMPRESSION: 1. Grossly stable mass within the pancreatic head compatible with known pancreatic cancer. 2. No significant change in the size and number of known liver metastases, allowing for differences in technique compared to previous MRI. 3. Trace pelvic free fluid. 4.  Aortic Atherosclerosis (ICD10-I70.0). Electronically Signed   By: Randa Ngo M.D.   On: 06/26/2021 17:01   VAS [place] LOWER EXTREMITY VENOUS (DVT)  Result Date: 06/29/2021  Lower Venous DVT Study Patient Name:  ALMETER WESTHOFF  Date of Exam:   06/29/2021 Medical Rec #: [id number]        Accession #:    [id number] Date of Birth: February 14, 1966        Patient Gender: F Patient Age:   89T Exam Location:  [facility] Procedure:      VAS [place] LOWER EXTREMITY VENOUS (DVT) Referring Phys: [place] --------------------------------------------------------------------------------  Indications: RLE swelling.  Risk Factors: Chemotherapy Cancer Pancreatic w/ mets. Limitations: Poor ultrasound/tissue interface. Comparison Study: No previous exams Performing Technologist: Jody Hill RVT, RDMS  Examination Guidelines: A complete evaluation includes B-mode imaging, spectral Doppler, color Doppler, and power  Doppler as needed of all accessible portions of each vessel. Bilateral testing is considered an integral part of a complete examination. Limited examinations for reoccurring indications may be performed as noted. The reflux portion of the exam is performed with the patient in reverse Trendelenburg.  +---------+---------------+---------+-----------+----------+--------------+ RIGHT    CompressibilityPhasicitySpontaneityPropertiesThrombus Aging +---------+---------------+---------+-----------+----------+--------------+ CFV      Full           Yes      Yes                                 +---------+---------------+---------+-----------+----------+--------------+ SFJ      Full                                                        +---------+---------------+---------+-----------+----------+--------------+ FV Prox  Full           Yes      Yes                                 +---------+---------------+---------+-----------+----------+--------------+ FV Mid   Full           Yes      Yes                                 +---------+---------------+---------+-----------+----------+--------------+  FV DistalFull           Yes      Yes                                 +---------+---------------+---------+-----------+----------+--------------+ PFV      Full                                                        +---------+---------------+---------+-----------+----------+--------------+ POP      Full           Yes      Yes                                 +---------+---------------+---------+-----------+----------+--------------+ PTV      Full                                                        +---------+---------------+---------+-----------+----------+--------------+ PERO     Full                                                        +---------+---------------+---------+-----------+----------+--------------+    +---------+---------------+---------+-----------+----------+--------------+ LEFT     CompressibilityPhasicitySpontaneityPropertiesThrombus Aging +---------+---------------+---------+-----------+----------+--------------+ CFV      Full           Yes      Yes                                 +---------+---------------+---------+-----------+----------+--------------+ SFJ      Full                                                        +---------+---------------+---------+-----------+----------+--------------+ FV Prox  Full           Yes      Yes                                 +---------+---------------+---------+-----------+----------+--------------+ FV Mid   Full           Yes      Yes                                 +---------+---------------+---------+-----------+----------+--------------+ FV DistalFull           Yes      Yes                                 +---------+---------------+---------+-----------+----------+--------------+ PFV      Full                                                        +---------+---------------+---------+-----------+----------+--------------+  POP      Full           Yes      Yes                                 +---------+---------------+---------+-----------+----------+--------------+ PTV      Full                                                        +---------+---------------+---------+-----------+----------+--------------+ PERO     Full                                                        +---------+---------------+---------+-----------+----------+--------------+     Summary: BILATERAL: - No evidence of deep vein thrombosis seen in the lower extremities, bilaterally. - No evidence of superficial venous thrombosis in the lower extremities, bilaterally. -No evidence of popliteal cyst, bilaterally. RIGHT: - Ultrasound characteristics of enlarged lymph nodes are noted in the groin.  LEFT: - Ultrasound  characteristics of enlarged lymph nodes noted in the groin.  *See table(s) above for measurements and observations. Electronically signed by Ruta Hinds MD on 06/29/2021 at 7:52:11 PM.    Final     ASSESSMENT AND PLAN: 1. Pancreas cancer -CT abdomen/pelvis without contrast 02/16/2021-multiple large hypoechoic masses in the patient's liver consistent metastatic disease, ill-defined masslike area in the pancreatic head measuring approximate 4.7 cm, possible filling defect in the right ventricle. -EGD 02/19/2021-large infiltrative mass with bleeding in the second portion of the duodenum, appears to be arising near the ampulla, partially obstructive with friable mucosa.  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 -Flexible sigmoidoscopy 02/19/2021-diverticulosis in the sigmoid colon, nonbleeding external and internal hemorrhoids -Cardiac CT 02/20/2021-mass in the mid RV attached to the interventricular septum measuring 16 mm x 6 mm and concerning for metastatic tumor -MRI abdomen with/without contrast 02/21/2021-mass in the posterior pancreatic head measuring 4.3 x 3.8 cm with obstruction of the central pancreatic duct and diffuse dilatation up to 6 mm consistent with pancreatic adenocarcinoma, liver lesions the largest mass of the central left lobe measuring 9.9 x 9.2 cm consistent with hepatic metastatic disease,  hepatomegaly. -Ultrasound-guided biopsy of a left liver lesion 02/24/2021-adenocarcinoma, morphologically similar to the duodenal biopsy; microsatellite stable, tumor mutation burden 1 -Negative genetic testing -Palliative radiation to the duodenal mass 02/25/2021-03/06/2021 -Cycle 1 FOLFOX 03/17/2021 -Cycle 2 FOLFOX 04/01/2021 -Cycle 3 FOLFIRINOX 04/14/2021 -Cycle 4 FOLFIRINOX 04/27/2021, Udenyca added -Cycle 5 FOLFIRINOX 05/12/2021, Udenyca -MRI abdomen 05/23/2021-decrease in size of pancreas mass and hepatic lesions, no progressive disease -Cycle 6 FOLFIRINOX 05/27/2021 2.   Iron deficiency anemia-improved -Feraheme 510 mg 02/20/2021 3.  Protein calorie malnutrition 4.  Possible right ventricular filling defect on noncontrast CT scan MRI cardiac morphology 02/21/2021-mass in the mid RV attached to the interventricular septum measures 16 mm x 6 mm.  Mass appears heterogeneous with area of enhancement on LGE imaging.  Mass is concerning for metastatic tumor.  Normal LV size and systolic function.  Normal RV size and systolic function. 5.  Hypertension 6.  History of CVA 7.  Hyperlipidemia 8.  Hypothyroidism 9.  Morbid  obesity 10.  Asthma 11.  Migraines 12.  Pain secondary to #1 13.  Port-A-Cath placement 02/25/2021 14.  Hypokalemia 04/01/2021-started a potassium supplement 15.  Hospital admission 06/17/2021-intractable nausea and vomiting EGD 06/19/2021-diffuse gastritis, nonobstructing duodenal mass, biopsy of stomach-no malignancy or H. pylori, duodenal mass-adenocarcinoma  Ms. Rembold continues to have intermittent nausea and vomiting.  The nausea is likely secondary to the pancreas tumor and liver metastases.  She has experienced partial improvement with scopolamine.  Interventional radiology plans a barium enema and attempt at placement of a GJ tube today.  The goal of the tube placement is to provide a venting gastrostomy and nutrition.  She continues to have pain secondary to pancreas cancer.  I will increase the MS Contin dose.  She will continue Dilaudid and oxycodone for breakthrough pain.  The lower extremity Dopplers were negative for DVT.  The CA 19-9 is stable.  The plan is to resume systemic therapy as an outpatient.  We will most likely make a change to gemcitabine/Abraxane.  Recommendations: 1.  Continue antacid regimen. 2.  Discontinue erythromycin 3.  Proceed with GJ tube placement by IR today. 4.  Dietitian consult after tube placement to help with tube feedings. 5.  Increase MS Contin and oxycodone doses    LOS: 14 days   Betsy Coder 07/01/21

## 2021-07-01 NOTE — Procedures (Signed)
Interventional Radiology Procedure Note  Procedure: GJ tube placement  Indication: Nausea and bloating related to compression of stomach by hepatic mets.  Findings: Please refer to procedural dictation for full description.  Complications: None  EBL: < 10 mL  Miachel Roux, MD [phone]

## 2021-07-02 DIAGNOSIS — D500 Iron deficiency anemia secondary to blood loss (chronic): Secondary | ICD-10-CM

## 2021-07-02 DIAGNOSIS — C250 Malignant neoplasm of head of pancreas: Secondary | ICD-10-CM | POA: Diagnosis not present

## 2021-07-02 LAB — CBC
HCT: 29 % — ABNORMAL LOW (ref 36.0–46.0)
Hemoglobin: 9.5 g/dL — ABNORMAL LOW (ref 12.0–15.0)
MCH: 29.9 pg (ref 26.0–34.0)
MCHC: 32.8 g/dL (ref 30.0–36.0)
MCV: 91.2 fL (ref 80.0–100.0)
Platelets: 170 10*3/uL (ref 150–400)
RBC: 3.18 MIL/uL — ABNORMAL LOW (ref 3.87–5.11)
RDW: 17.2 % — ABNORMAL HIGH (ref 11.5–15.5)
WBC: 9.2 10*3/uL (ref 4.0–10.5)
nRBC: 0 % (ref 0.0–0.2)

## 2021-07-02 LAB — GLUCOSE, CAPILLARY: Glucose-Capillary: 102 mg/dL — ABNORMAL HIGH (ref 70–99)

## 2021-07-02 LAB — COMPREHENSIVE METABOLIC PANEL
ALT: 20 U/L (ref 0–44)
AST: 23 U/L (ref 15–41)
Albumin: 2.9 g/dL — ABNORMAL LOW (ref 3.5–5.0)
Alkaline Phosphatase: 69 U/L (ref 38–126)
Anion gap: 10 (ref 5–15)
BUN: 6 mg/dL (ref 6–20)
CO2: 25 mmol/L (ref 22–32)
Calcium: 8.5 mg/dL — ABNORMAL LOW (ref 8.9–10.3)
Chloride: 104 mmol/L (ref 98–111)
Creatinine, Ser: 0.58 mg/dL (ref 0.44–1.00)
GFR, Estimated: >60 mL/min (ref >60)
Glucose, Bld: 93 mg/dL (ref 70–99)
Potassium: 3.8 mmol/L (ref 3.5–5.1)
Sodium: 139 mmol/L (ref 135–145)
Total Bilirubin: 1 mg/dL (ref 0.3–1.2)
Total Protein: 5.3 g/dL — ABNORMAL LOW (ref 6.5–8.1)

## 2021-07-02 LAB — PHOSPHORUS
Phosphorus: 3.2 mg/dL (ref 2.5–4.6)
Phosphorus: 2.9 mg/dL (ref 2.5–4.6)

## 2021-07-02 LAB — MAGNESIUM
Magnesium: 1.7 mg/dL (ref 1.7–2.4)
Magnesium: 1.7 mg/dL (ref 1.7–2.4)

## 2021-07-02 MED ORDER — OSMOLITE 1.5 CAL PO LIQD
1000.0000 mL | ORAL | Status: DC
  Administered 2021-07-02 – 2021-07-05 (×4): 1000 mL
  Filled 2021-07-02 (×5): qty 1000

## 2021-07-02 NOTE — Progress Notes (Addendum)
HEMATOLOGY-ONCOLOGY PROGRESS NOTE  SUBJECTIVE: Continues to report abdominal pain.  She has not had any nausea or vomiting since GJ tube placement.  Currently hooked to suction.  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  The report date is March 23, 2021.   The Multi-Cancer Panel with pancreatitis genes and preliminary pancreatic cancer genes offered by [facility] includes sequencing and/or deletion duplication testing of the following 91 genes: AIP, ALK, APC, ATM, AXIN2,BAP1,  BARD1, BLM, BMPR1A, BRCA1, BRCA2, BRIP1, CASR, CDC73, CDH1, CDK4, CDKN1B, CDKN1C, CDKN2A (p14ARF), CDKN2A (p16INK4a), CEBPA, CFTR, CHEK2, CPA1, CTNNA1, CTRC, DICER1, DIS3L2, EGFR (c.2369C>T, p.Thr790Met variant only), EPCAM (Deletion/duplication testing only), FANCC, FH, FLCN, GATA2, GPC3, GREM1 (Promoter region deletion/duplication testing only), HOXB13 (c.251G>A, p.Gly84Glu), HRAS, KIT, MAX, MEN1, MET, MITF (c.952G>A, p.Glu318Lys variant only), MLH1, MSH2, MSH3, MSH6, MUTYH, NBN, NF1, NF2, NTHL1, PALB2, PALLD, PDGFRA, PHOX2B, PMS2, POLD1, POLE, POT1, PRKAR1A, PRSS1, PTCH1, PTEN, RAD50, RAD51C, RAD51D, RB1, RECQL4, RET, RNF43, RUNX1, SDHAF2, SDHA (sequence changes only), SDHB, SDHC, SDHD, SMAD4, SMARCA4, SMARCB1, SMARCE1, SPINK1, STK11, SUFU, TERC, TERT, TMEM127, TP53, TSC1, TSC2, VHL, WRN and WT1.     PHYSICAL EXAMINATION:  Vitals:   07/01/21 2117 07/02/21 0531  BP:  129/83 (!) 149/94  Pulse: 94 84  Resp:  13  Temp: 98.6 F (37 C) 99 F (37.2 C)  SpO2: 96% 100%   Intake/Output from previous day: 07/20 0701 - 07/21 0700 In: 2016.1 [I.V.:1383.6; IV Piggyback:612.4] Out: -   OROPHARYNX: No thrush ABDOMEN: fullness and tenderness in the mid and rt. Upper abdomen  Vascular: No leg edema LABORATORY DATA:  I have reviewed the data as listed CMP Latest Ref Rng & Units 07/02/2021 06/29/2021 06/28/2021  Glucose 70 - 99 mg/dL 93 91 89  BUN 6 - 20 mg/dL 6 <5(L) 6  Creatinine 0.44 - 1.00 mg/dL 0.58 0.74 0.85  Sodium 135 - 145 mmol/L 139 139 139  Potassium 3.5 - 5.1 mmol/L 3.8 2.9(L) 3.1(L)  Chloride 98 - 111 mmol/L 104 105 105  CO2 22 - 32 mmol/L $RemoveB'25 27 27  'udToQOhz$ Calcium 8.9 - 10.3 mg/dL 8.5(L) 8.1(L) 8.5(L)  Total Protein 6.5 - 8.1 g/dL 5.3(L) 5.6(L) -  Total Bilirubin 0.3 - 1.2 mg/dL 1.0 0.7 -  Alkaline Phos 38 - 126 U/L 69 83 -  AST 15 - 41 U/L 23 33 -  ALT 0 - 44 U/L 20 21 -    Lab Results  Component Value Date   WBC 9.2 07/02/2021   HGB 9.5 (L) 07/02/2021   HCT 29.0 (L) 07/02/2021   MCV 91.2 07/02/2021   PLT 170 07/02/2021   NEUTROABS 3.8 06/22/2021    CT ABDOMEN PELVIS WO CONTRAST  Result Date: 06/17/2021 CLINICAL DATA:  Rule out bowel obstruction. History of pancreas cancer. EXAM: CT ABDOMEN AND PELVIS WITHOUT CONTRAST TECHNIQUE: Multidetector CT imaging of the abdomen and pelvis was performed following the standard protocol without IV contrast. COMPARISON:  05/23/2021 FINDINGS: Lower chest: No acute abnormality. Hepatobiliary: Within a background of diffuse hepatic steatosis there are multiple liver metastases as noted previously: The dominant lesion within the lateral segment of left hepatic lobe measures 7.8 cm, image 26/2. Previously this measured the same. The dominant lesion within the right hepatic lobe measures 8.2 cm, image 25/2. Previously 8 cm. Status post cholecystectomy. No biliary dilatation. Pancreas: Head of pancreas mass is  suboptimally evaluated due to lack of IV contrast material. Subjectively, this does not appear significantly changed in size when compared with the previous contrast enhanced MRI. Spleen: Normal in size without focal abnormality. Adrenals/Urinary Tract: Normal adrenal glands. No hydronephrosis identified bilaterally. Punctate stone noted within inferior pole of left kidney. Exophytic cyst is again noted arising off the posterior cortex of the interpolar right kidney measuring 1.3 cm. Urinary bladder appears collapsed. Stomach/Bowel: Stomach is nondistended. The appendix is visualized and appears normal. No pathologic dilatation of the large or small bowel loops. No significant bowel wall thickening or surrounding inflammatory fat stranding. Vascular/Lymphatic: Mild aortic atherosclerosis. No aneurysm. No abdominopelvic adenopathy identified. Reproductive: Status post hysterectomy. No adnexal masses.  The Other: No ascites or focal fluid collections. No peritoneal nodularity identified at this time. Musculoskeletal: No acute or significant osseous findings. IMPRESSION: 1. No acute findings within the abdomen or pelvis. No evidence for bowel obstruction. 2. Multiple liver metastases.  Similar to previous exam. 3. Head of pancreas mass is suboptimally evaluated due to lack of IV contrast material. Subjectively, this does not appear significantly changed in size when compared with the previous contrast enhanced MRI. 4. Aortic atherosclerosis. Aortic Atherosclerosis (ICD10-I70.0). Electronically Signed   By: Kerby Moors M.D.   On: 06/17/2021 19:07   DG THORACOLUMABAR SPINE  Result Date: 06/28/2021 CLINICAL DATA:  Low back pain for 1 day, history of metastatic pancreatic cancer EXAM: THORACOLUMBAR SPINE 1V COMPARISON:  06/26/2021 FINDINGS: Frontal and lateral views of the thoracolumbar spine are obtained. There are no acute or destructive bony lesions. Mild spondylosis is seen within the mid to lower thoracic  spine. Severe facet hypertrophic changes are seen within the mid to lower lumbar spine. Sacroiliac joints are normal. Visualized bowel gas pattern is unremarkable. IMPRESSION: 1. Mild mid to lower thoracic spondylosis, with severe lower lumbar facet hypertrophy. 2. No acute or destructive bony lesions. Electronically Signed   By: Randa Ngo M.D.   On: 06/28/2021 15:22   CT ABDOMEN PELVIS W CONTRAST  Result Date: 06/26/2021 CLINICAL DATA:  Worsening nausea and vomiting over the last week, pancreatic cancer undergoing chemotherapy EXAM: CT ABDOMEN AND PELVIS WITH CONTRAST TECHNIQUE: Multidetector CT imaging of the abdomen and pelvis was performed using the standard protocol following bolus administration of intravenous contrast. CONTRAST:  29mL OMNIPAQUE IOHEXOL 350 MG/ML SOLN COMPARISON:  06/17/2021, 05/23/2021, 02/16/2021 FINDINGS: Lower chest: No acute pleural or parenchymal lung disease. Central venous catheter tip within the right atrium near the junction with the IVC. No pericardial effusion. Hepatobiliary: Multiple hepatic masses are again identified. Largest lesion within the left lobe liver reference image 27/2 measures 8.0 x 6.1 cm, previously measuring 7.6 x 6.0 cm on MRI 05/23/2021. Largest lesion within the right lobe liver measures up to 8.2 x 4.5 cm on image 23/2, previously measuring 7.6 x 3.7 cm on MRI. Smaller lesions throughout the left lobe liver more inferiorly are grossly unchanged, though less well visualized on CT compared to MRI. No new lesions are definitively identified. No intrahepatic biliary duct dilation. Gallbladder is surgically absent. Pancreas: The  ill-defined mass within the pancreatic head measures up to approximately 2.8 x 3.1 cm reference image 65/2, previously having measured 2.9 x 2.9 cm on MRI 05/23/2021. Mild pancreatic duct dilation and upstream pancreatic parenchymal atrophy again noted unchanged. Spleen: No focal splenic abnormalities. Mild splenomegaly unchanged.  Adrenals/Urinary Tract: No urinary tract calculi or obstructive uropathy within either kidney. The kidneys enhance normally and symmetrically. Small left renal cortical cysts unchanged. The adrenals are unremarkable. Bladder is only minimally distended with no gross abnormalities. Stomach/Bowel: No bowel obstruction or ileus. Normal retrocecal appendix. No bowel wall thickening or inflammatory change. Vascular/Lymphatic: Mild atherosclerosis of the aorta and iliac vessels unchanged. No discrete adenopathy within the abdomen or pelvis. Reproductive: Status post hysterectomy. No adnexal masses. Other: Trace pelvic free fluid. No free intraperitoneal gas. No abdominal wall hernia. Musculoskeletal: No acute or destructive bony lesions. Reconstructed images demonstrate no additional findings. IMPRESSION: 1. Grossly stable mass within the pancreatic head compatible with known pancreatic cancer. 2. No significant change in the size and number of known liver metastases, allowing for differences in technique compared to previous MRI. 3. Trace pelvic free fluid. 4.  Aortic Atherosclerosis (ICD10-I70.0). Electronically Signed   By: Randa Ngo M.D.   On: 06/26/2021 17:01   IR GASTROSTOMY TUBE MOD SED  Result Date: 07/01/2021 INDICATION: 56 year old woman with history metastatic pancreatic cancer presents to interventional radiology for [place] tube placement for treatment of nausea and vomiting likely related to compression of the stomach from hepatic metastases. EXAM: Fluoroscopy guided GJ tube placement MEDICATIONS: Vancomycin 1 gm IV; Antibiotics were administered within 1 hour of the procedure. Glucagon 1 mg IV ANESTHESIA/SEDATION: Versed 3 mg IV; Fentanyl 150 mcg IV Moderate Sedation Time:  63 minutes The patient was continuously monitored during the procedure by the interventional radiology nurse under my direct supervision. CONTRAST:  10 mL of Omnipaque 300-administered into the gastric lumen. FLUOROSCOPY TIME:   Fluoroscopy Time: 8 minutes 12 seconds (229 mGy). COMPLICATIONS: None immediate. PROCEDURE: Informed written consent was obtained from the patient after a thorough discussion of the procedural risks, benefits and alternatives. All questions were addressed. Maximal Sterile Barrier Technique was utilized including caps, mask, sterile gowns, sterile gloves, sterile drape, hand hygiene and skin antiseptic. A timeout was performed prior to the initiation of the procedure. The inferior margin of the liver was marked utilizing ultrasound guidance. An orogastric tube was placed with fluoroscopic guidance. The anterior abdomen was prepped and draped in sterile fashion. Ultrasound evaluation of the left upper quadrant was performed to confirm the position of the liver. The skin and subcutaneous tissues were anesthetized with 1% lidocaine. Single gastropexy was placed utilizing fluoroscopic guidance. 19 gauge needle was directed into the distended stomach with fluoroscopic guidance. A wire was advanced into the stomach. 9-French vascular sheath was placed and the orogastric tube was snared using a Goos[place]ck snare device. Guidewire was advanced through the orogastric tube and snared. The snare and wire were pulled out of the patient's mouth. The snare device was connected to a 20-French gastrostomy tube. The snare device and gastrostomy tube were pulled through the patient's mouth and out the anterior abdominal wall. The gastrostomy tube was cut to an appropriate length. Contrast injection through gastrostomy tube confirmed placement within the stomach. Kumpe catheter and Glidewire advanced through the gastrostomy tube to the proximal jejunum. 9 French jejunostomy tube was inserted through the G-tube with tip positioned in the proximal jejunum. Position of the J limb was conformed by administering contrast under fluoroscopy. The gastrojejunostomy tube  was flushed with normal saline. IMPRESSION: Gastrojejunostomy tube placed  utilizing fluoroscopic guidance. The gastrostomy tube is 52 [place] while the jejunostomy tube is 46 [place]. Electronically Signed   By: Miachel Roux M.D.   On: 07/01/2021 16:41   VAS [place] LOWER EXTREMITY VENOUS (DVT)  Result Date: 06/29/2021  Lower Venous DVT Study Patient Name:  Wendy Hoffman  Date of Exam:   06/29/2021 Medical Rec #: [id number]        Accession #:    [id number] Date of Birth: [birth year]        Patient Gender: F Patient Age:   89E Exam Location:  [facility] Procedure:      VAS [place] LOWER EXTREMITY VENOUS (DVT) Referring Phys: [place] --------------------------------------------------------------------------------  Indications: RLE swelling.  Risk Factors: Chemotherapy Cancer Pancreatic w/ mets. Limitations: Poor ultrasound/tissue interface. Comparison Study: No previous exams Performing Technologist: Jody Hill RVT, RDMS  Examination Guidelines: A complete evaluation includes B-mode imaging, spectral Doppler, color Doppler, and power Doppler as needed of all accessible portions of each vessel. Bilateral testing is considered an integral part of a complete examination. Limited examinations for reoccurring indications may be performed as noted. The reflux portion of the exam is performed with the patient in reverse Trendelenburg.  +---------+---------------+---------+-----------+----------+--------------+ RIGHT    CompressibilityPhasicitySpontaneityPropertiesThrombus Aging +---------+---------------+---------+-----------+----------+--------------+ CFV      Full           Yes      Yes                                 +---------+---------------+---------+-----------+----------+--------------+ SFJ      Full                                                        +---------+---------------+---------+-----------+----------+--------------+ FV Prox  Full           Yes      Yes                                  +---------+---------------+---------+-----------+----------+--------------+ FV Mid   Full           Yes      Yes                                 +---------+---------------+---------+-----------+----------+--------------+ FV DistalFull           Yes      Yes                                 +---------+---------------+---------+-----------+----------+--------------+ PFV      Full                                                        +---------+---------------+---------+-----------+----------+--------------+ POP      Full           Yes      Yes                                 +---------+---------------+---------+-----------+----------+--------------+  PTV      Full                                                        +---------+---------------+---------+-----------+----------+--------------+ PERO     Full                                                        +---------+---------------+---------+-----------+----------+--------------+   +---------+---------------+---------+-----------+----------+--------------+ LEFT     CompressibilityPhasicitySpontaneityPropertiesThrombus Aging +---------+---------------+---------+-----------+----------+--------------+ CFV      Full           Yes      Yes                                 +---------+---------------+---------+-----------+----------+--------------+ SFJ      Full                                                        +---------+---------------+---------+-----------+----------+--------------+ FV Prox  Full           Yes      Yes                                 +---------+---------------+---------+-----------+----------+--------------+ FV Mid   Full           Yes      Yes                                 +---------+---------------+---------+-----------+----------+--------------+ FV DistalFull           Yes      Yes                                  +---------+---------------+---------+-----------+----------+--------------+ PFV      Full                                                        +---------+---------------+---------+-----------+----------+--------------+ POP      Full           Yes      Yes                                 +---------+---------------+---------+-----------+----------+--------------+ PTV      Full                                                        +---------+---------------+---------+-----------+----------+--------------+  PERO     Full                                                        +---------+---------------+---------+-----------+----------+--------------+     Summary: BILATERAL: - No evidence of deep vein thrombosis seen in the lower extremities, bilaterally. - No evidence of superficial venous thrombosis in the lower extremities, bilaterally. -No evidence of popliteal cyst, bilaterally. RIGHT: - Ultrasound characteristics of enlarged lymph nodes are noted in the groin.  LEFT: - Ultrasound characteristics of enlarged lymph nodes noted in the groin.  *See table(s) above for measurements and observations. Electronically signed by Ruta Hinds MD on 06/29/2021 at 7:52:11 PM.    Final     ASSESSMENT AND PLAN: 1. Pancreas cancer -CT abdomen/pelvis without contrast 02/16/2021-multiple large hypoechoic masses in the patient's liver consistent metastatic disease, ill-defined masslike area in the pancreatic head measuring approximate 4.7 cm, possible filling defect in the right ventricle. -EGD 02/19/2021-large infiltrative mass with bleeding in the second portion of the duodenum, appears to be arising near the ampulla, partially obstructive with friable mucosa.  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 -Flexible sigmoidoscopy 02/19/2021-diverticulosis in the sigmoid colon, nonbleeding external and internal hemorrhoids -Cardiac CT 02/20/2021-mass in the mid RV attached to  the interventricular septum measuring 16 mm x 6 mm and concerning for metastatic tumor -MRI abdomen with/without contrast 02/21/2021-mass in the posterior pancreatic head measuring 4.3 x 3.8 cm with obstruction of the central pancreatic duct and diffuse dilatation up to 6 mm consistent with pancreatic adenocarcinoma, liver lesions the largest mass of the central left lobe measuring 9.9 x 9.2 cm consistent with hepatic metastatic disease,  hepatomegaly. -Ultrasound-guided biopsy of a left liver lesion 02/24/2021-adenocarcinoma, morphologically similar to the duodenal biopsy; microsatellite stable, tumor mutation burden 1 -Negative genetic testing -Palliative radiation to the duodenal mass 02/25/2021-03/06/2021 -Cycle 1 FOLFOX 03/17/2021 -Cycle 2 FOLFOX 04/01/2021 -Cycle 3 FOLFIRINOX 04/14/2021 -Cycle 4 FOLFIRINOX 04/27/2021, Udenyca added -Cycle 5 FOLFIRINOX 05/12/2021, Udenyca -MRI abdomen 05/23/2021-decrease in size of pancreas mass and hepatic lesions, no progressive disease -Cycle 6 FOLFIRINOX 05/27/2021 2.  Iron deficiency anemia-improved -Feraheme 510 mg 02/20/2021 3.  Protein calorie malnutrition 4.  Possible right ventricular filling defect on noncontrast CT scan MRI cardiac morphology 02/21/2021-mass in the mid RV attached to the interventricular septum measures 16 mm x 6 mm.  Mass appears heterogeneous with area of enhancement on LGE imaging.  Mass is concerning for metastatic tumor.  Normal LV size and systolic function.  Normal RV size and systolic function. 5.  Hypertension 6.  History of CVA 7.  Hyperlipidemia 8.  Hypothyroidism 9.  Morbid obesity 10.  Asthma 11.  Migraines 12.  Pain secondary to #1 13.  Port-A-Cath placement 02/25/2021 14.  Hypokalemia 04/01/2021-started a potassium supplement 15.  Hospital admission 06/17/2021-intractable nausea and vomiting EGD 06/19/2021-diffuse gastritis, nonobstructing duodenal mass, biopsy of stomach-no malignancy or H. pylori, duodenal  mass-adenocarcinoma  Wendy Hoffman appears improved.  She does continue to have persistent abdominal pain.  Her MS Contin was increased yesterday and she has oxycodone solution available to her for breakthrough pain.  Dose was increased yesterday.  Her nausea and vomiting have resolved since GJ tube placement.   The goal of the tube placement is to provide a venting gastrostomy and nutrition.  We will request dietitian consult to manage  tube feeding.  G-tube currently hooked to suction and would recommend changing to drainage by gravity.  Discussed with nursing.  The CA 19-9 is stable.  The plan is to resume systemic therapy as an outpatient.  We will most likely make a change to gemcitabine/Abraxane.  Recommendations: 1.  Continue antacid regimen. 2.  Dietitian consult to manage tube feeding.  Liquid diet as tolerated.  Arrange for home tube feedings 3.  G-tube to drain to gravity. 4.  Continue MS Contin and oxycodone elixir for breakthrough pain.  Outpatient follow-up to be scheduled at the cancer center at the end of next week to discuss additional chemotherapy.   LOS: 15 days   Mikey Bussing 07/02/21 Wendy Hoffman was interviewed and examined.  She underwent GJ tube placement yesterday.  I reviewed use of the tubes with her today.  Hopefully she will again tube feedings today.  The G-tube can be clamped and used for venting when she develops nausea.  I encouraged her to use liquid oxycodone as needed for breakthrough pain.  I was present for greater than 50% of today's visit.  I performed medical decision making.

## 2021-07-02 NOTE — Progress Notes (Signed)
PROGRESS NOTE  Wendy Hoffman DVV:[id number] DOB: January 19, 1966 DOA: 06/17/2021 PCP: Patient, No Pcp Per (Inactive)  HPI/Recap of past 55 hours: 55 year old female past medical history of pancreatic cancer, hypertension, morbid obesity and COPD admitted on 7/6 for intractable nausea/vomiting and found to be in acute kidney injury.  Despite hydration, attempts to advance diet led to recurrence of nausea and vomiting.  GJ tube placed by interventional radiology on 7/20.  Patient also found to have a mild UTI and completed 3 days of antibiotics.  Patient doing better with combination of scheduled extended release pain medication and as needed for breakthrough.  Tolerating tube and tube feeds being initiated.  Otherwise no complaints.  Assessment/Plan: Principal Problem:   Nausea and vomiting: Secondary to invasive adenocarcinoma.  Status post GJ tube placed.  Osmolite started.  Patient does not complain of nausea. Active Problems:   Migraine   Hypothyroidism: Continue Synthroid    Tobacco abuse: Nicotine patch.    COPD (chronic obstructive pulmonary disease) ([place]): Stable.    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  Status post palliative radiation.  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  Pain better controlled    Iron deficiency anemia due to chronic blood loss: Status post iron Feraheme      Hypokalemia/hypomagnesemia: Secondary to nausea and vomiting.  Replacing.    ARF (acute renal failure) (HCC)/AKI: Secondary to nausea and vomiting and poor p.o. intake.  Resolved with IV fluids.    Malnutrition of moderate degree: Nutrition Status: Nutrition Problem: Moderate Malnutrition Etiology: chronic illness, cancer and cancer related treatments Signs/Symptoms: percent weight loss, energy intake < or equal to 75% for > or equal to 1 month, mild muscle depletion: Seen by nutrition.  Osmolite tube feeds  initiated  Morbid obesity: Meets criteria with BMI greater than 35+ hypertension and hyperlipidemia  E. coli UTI: Status post 3 days antibiotics.  Essential hypertension: Continue home medications.  Code Status: Full code  Family Communication: Left message for family  Disposition Plan: Potential discharge tomorrow   Consultants: Oncology Interventional radiology Gastroenterology  Procedures: GJ tube placement 7/20 EGD done 7/8  Antimicrobials: Completed course of Rocephin Completed course of Diflucan  DVT prophylaxis: SCDs  Level of care: Med-Surg   Objective: Vitals:   07/01/21 2117 07/02/21 0531  BP: 129/83 (!) 149/94  Pulse: 94 84  Resp:  13  Temp: 98.6 F (37 C) 99 F (37.2 C)  SpO2: 96% 100%    Intake/Output Summary (Last 24 hours) at 07/02/2021 1430 Last data filed at 07/02/2021 0409 Gross per 24 hour  Intake 2016.08 ml  Output --  Net 2016.08 ml    Filed Weights   06/25/21 1100  Weight: 102.1 kg   Body mass index is 38.62 kg/m.  Exam:  General: Alert and oriented x3, no acute distress Cardiovascular: Regular rate and rhythm, S1-S2 Respiratory: Clear to auscultation bilaterally Abdomen: Soft, mild distention, nonspecific tenderness, hypoactive bowel sounds Musculoskeletal: No clubbing or cyanosis or edema Skin: No skin breaks, tears or lesions Psychiatry: Appropriate, no evidence of psychoses Neurology: No focal deficits   Data Reviewed: CBC: Recent Labs  Lab 06/27/21 0511 07/02/21 0553  WBC 5.0 9.2  HGB 8.9* 9.5*  HCT 27.9* 29.0*  MCV 91.5 91.2  PLT 151 626    Basic Metabolic Panel: Recent Labs  Lab 06/27/21 0511 06/28/21 0500 06/29/21 0435 07/02/21 0553 07/02/21 1250  NA 140 139 139 139  --   K 2.9*  3.1* 2.9* 3.8  --   CL 108 105 105 104  --   CO2 26 27 27 25   --   GLUCOSE 99 89 91 93  --   BUN 9 6 <5* 6  --   CREATININE 1.01* 0.85 0.74 0.58  --   CALCIUM 8.2* 8.5* 8.1* 8.5*  --   MG  --   --  1.6*  --  1.7   PHOS  --   --   --   --  3.2    GFR: Estimated Creatinine Clearance: 94.6 mL/min (by C-G formula based on SCr of 0.58 mg/dL). Liver Function Tests: Recent Labs  Lab 06/27/21 0511 06/29/21 0435 07/02/21 0553  AST 59* 33 23  ALT 22 21 20   ALKPHOS 80 83 69  BILITOT 0.6 0.7 1.0  PROT 5.2* 5.6* 5.3*  ALBUMIN 3.0* 3.1* 2.9*    No results for input(s): LIPASE, AMYLASE in the last 168 hours. No results for input(s): AMMONIA in the last 168 hours. Coagulation Profile: Recent Labs  Lab 06/27/21 0511  INR 1.2    Cardiac Enzymes: No results for input(s): CKTOTAL, CKMB, CKMBINDEX, TROPONINI in the last 168 hours. BNP (last 3 results) No results for input(s): PROBNP in the last 8760 hours. HbA1C: No results for input(s): HGBA1C in the last 72 hours. CBG: No results for input(s): GLUCAP in the last 168 hours. Lipid Profile: No results for input(s): CHOL, HDL, LDLCALC, TRIG, CHOLHDL, LDLDIRECT in the last 72 hours. Thyroid Function Tests: No results for input(s): TSH, T4TOTAL, FREET4, T3FREE, THYROIDAB in the last 72 hours. Anemia Panel: No results for input(s): VITAMINB12, FOLATE, FERRITIN, TIBC, IRON, RETICCTPCT in the last 72 hours. Urine analysis:    Component Value Date/Time   COLORURINE YELLOW 06/18/2021 1000   APPEARANCEUR CLOUDY (A) 06/18/2021 1000   LABSPEC 1.011 06/18/2021 1000   PHURINE 6.0 06/18/2021 1000   GLUCOSEU NEGATIVE 06/18/2021 1000   HGBUR MODERATE (A) 06/18/2021 1000   BILIRUBINUR NEGATIVE 06/18/2021 1000   KETONESUR NEGATIVE 06/18/2021 1000   PROTEINUR 30 (A) 06/18/2021 1000   UROBILINOGEN 0.2 09/08/2014 1800   NITRITE NEGATIVE 06/18/2021 1000   LEUKOCYTESUR LARGE (A) 06/18/2021 1000   Sepsis Labs: @LABRCNTIP (procalcitonin:4,lacticidven:4)  )No results found for this or any previous visit (from the past 240 hour(s)).    Studies: IR GASTROSTOMY TUBE MOD SED  Result Date: 07/01/2021 INDICATION: 55 year old woman with history metastatic  pancreatic cancer presents to interventional radiology for [place] tube placement for treatment of nausea and vomiting likely related to compression of the stomach from hepatic metastases. EXAM: Fluoroscopy guided GJ tube placement MEDICATIONS: Vancomycin 1 gm IV; Antibiotics were administered within 1 hour of the procedure. Glucagon 1 mg IV ANESTHESIA/SEDATION: Versed 3 mg IV; Fentanyl 150 mcg IV Moderate Sedation Time:  63 minutes The patient was continuously monitored during the procedure by the interventional radiology nurse under my direct supervision. CONTRAST:  10 mL of Omnipaque 300-administered into the gastric lumen. FLUOROSCOPY TIME:  Fluoroscopy Time: 8 minutes 12 seconds (229 mGy). COMPLICATIONS: None immediate. PROCEDURE: Informed written consent was obtained from the patient after a thorough discussion of the procedural risks, benefits and alternatives. All questions were addressed. Maximal Sterile Barrier Technique was utilized including caps, mask, sterile gowns, sterile gloves, sterile drape, hand hygiene and skin antiseptic. A timeout was performed prior to the initiation of the procedure. The inferior margin of the liver was marked utilizing ultrasound guidance. An orogastric tube was placed with fluoroscopic guidance. The anterior abdomen was prepped  and draped in sterile fashion. Ultrasound evaluation of the left upper quadrant was performed to confirm the position of the liver. The skin and subcutaneous tissues were anesthetized with 1% lidocaine. Single gastropexy was placed utilizing fluoroscopic guidance. 19 gauge needle was directed into the distended stomach with fluoroscopic guidance. A wire was advanced into the stomach. 9-French vascular sheath was placed and the orogastric tube was snared using a Gooseneck snare device. Guidewire was advanced through the orogastric tube and snared. The snare and wire were pulled out of the patient's mouth. The snare device was connected to a 20-French  gastrostomy tube. The snare device and gastrostomy tube were pulled through the patient's mouth and out the anterior abdominal wall. The gastrostomy tube was cut to an appropriate length. Contrast injection through gastrostomy tube confirmed placement within the stomach. Kumpe catheter and Glidewire advanced through the gastrostomy tube to the proximal jejunum. 9 French jejunostomy tube was inserted through the G-tube with tip positioned in the proximal jejunum. Position of the J limb was conformed by administering contrast under fluoroscopy. The gastrojejunostomy tube was flushed with normal saline. IMPRESSION: Gastrojejunostomy tube placed utilizing fluoroscopic guidance. The gastrostomy tube is 16 [place] while the jejunostomy tube is 74 [place]. Electronically Signed   By: Miachel Roux M.D.   On: 07/01/2021 16:41    Scheduled Meds:  amLODipine  10 mg Oral Daily   bisacodyl  10 mg Oral Daily   Chlorhexidine Gluconate Cloth  6 each Topical Daily   feeding supplement  1 Container Oral TID BM   feeding supplement (OSMOLITE 1.5 CAL)  1,000 mL Per Tube Q24H   heparin lock flush  500 Units Intracatheter Q30 days   magnesium oxide  800 mg Oral Daily   morphine  60 mg Oral Q12H   neomycin-bacitracin-polymyxin  1 application Topical Daily   pantoprazole  40 mg Oral BID   potassium chloride  20 mEq Oral Daily   scopolamine  1 patch Transdermal Q72H   sodium chloride flush  10-40 mL Intracatheter Q12H   sucralfate  1 g Oral Q6H    Continuous Infusions:  0.9 % NaCl with KCl 40 mEq / L 100 mL/hr at 07/02/21 0934   promethazine (PHENERGAN) injection (IM or IVPB) 200 mL/hr at 06/26/21 1616     LOS: 15 days     Annita Brod, MD [facility]   07/02/2021, 2:30 PM

## 2021-07-02 NOTE — Progress Notes (Signed)
Referring Physician(s): Dr. Rodena Piety Dr. Benay Spice  Supervising Physician: Sandi Mariscal  Patient Status:  [facility] - In-pt  Chief Complaint: Intractable nausea/vomiting Pancreatic cancer  Subjective: Patient s/p gastrostomy tube placement.  Tube already in use.  Site tender.   Allergies: Blueberry [vaccinium angustifolium], Mango flavor, Oxaliplatin, Reglan [metoclopramide], Aspirin, and Penicillins  Medications: Prior to Admission medications   Medication Sig Start Date End Date Taking? Authorizing Provider  albuterol (VENTOLIN HFA) 108 (90 Base) MCG/ACT inhaler INHALE 1 TO 2 PUFFS BY MOUTH EVERY 6 HOURS AS NEEDED FOR WHEEZING AND FOR SHORTNESS OF BREATH Patient taking differently: Inhale 1-2 puffs into the lungs every 6 (six) hours as needed for wheezing or shortness of breath. 01/06/21 01/06/22 Yes Millsaps, Joelene Millin, NP  gabapentin (NEURONTIN) 300 MG capsule TAKE 1 CAPSULE AS NEEDED BY ORAL ROUTE AT BEDTIME. Patient taking differently: Take 300 mg by mouth at bedtime as needed (neuropathy). 01/06/21 01/06/22 Yes Millsaps, Joelene Millin, NP  lidocaine-prilocaine (EMLA) cream Apply 1 application topically as needed. Patient taking differently: Apply 1 application topically as needed (port access). 03/06/21  Yes Ladell Pier, MD  magic mouthwash SOLN Take 5-10 mLs by mouth 4 (four) times daily as needed for mouth pain (swish and spit for mouth pain). 04/06/21  Yes Ladell Pier, MD  morphine (MS CONTIN) 30 MG 12 hr tablet Take 1 tablet (30 mg total) by mouth every 12 (twelve) hours. 05/20/21  Yes Owens Shark, NP  oxyCODONE-acetaminophen (PERCOCET) 7.5-325 MG tablet Take 1 tablet by mouth every 4 (four) hours as needed for severe pain. 05/26/21  Yes Ladell Pier, MD  potassium chloride (MICRO-K) 10 MEQ CR capsule Take 2 capsules (20 mEq total) by mouth 2 (two) times daily. 06/05/21  Yes Ladell Pier, MD  prochlorperazine (COMPAZINE) 10 MG tablet TAKE 1 TABLET(10 MG) BY MOUTH EVERY 6  HOURS AS NEEDED FOR NAUSEA OR VOMITING Patient taking differently: Take 10 mg by mouth every 6 (six) hours as needed for nausea or vomiting. 05/04/21  Yes Ladell Pier, MD  SYMBICORT 160-4.5 MCG/ACT inhaler INHALE 2 PUFFS INTO THE LUNGS 2 (TWO) TIMES DAILY. Patient taking differently: Inhale 2 puffs into the lungs 2 (two) times daily. 03/07/20  Yes Charlott Rakes, MD  zolpidem (AMBIEN) 10 MG tablet Take 1 tablet (10 mg total) by mouth at bedtime as needed for sleep. Patient taking differently: Take 10 mg by mouth at bedtime. 04/14/21  Yes Owens Shark, NP  albuterol (PROVENTIL) (2.5 MG/3ML) 0.083% nebulizer solution Take 3 mLs (2.5 mg total) by nebulization every 4 (four) hours as needed for wheezing or shortness of breath. Patient not taking: No sig reported 40/0/86   Delora Fuel, MD  amLODipine (NORVASC) 10 MG tablet TAKE 1 TABLET BY MOUTH ONCE DAILY Patient not taking: No sig reported 01/06/21 01/06/22  Millsaps, Joelene Millin, NP  budesonide-formoterol (SYMBICORT) 160-4.5 MCG/ACT inhaler INHALE 2 PUFFS INTO THE LUNGS 2 (TWO) TIMES DAILY. Patient not taking: Reported on 06/17/2021 01/06/21 01/06/22  Everardo Beals, NP  dexlansoprazole (DEXILANT) 60 MG capsule TAKE 1 CAPSULE BY MOUTH ONCE DAILY Patient not taking: No sig reported 01/06/21 01/06/22  Everardo Beals, NP  magnesium oxide (MAG-OX) 400 (240 Mg) MG tablet Take 1 tablet (400 mg total) by mouth daily. Patient not taking: Reported on 06/17/2021 04/27/21   Owens Shark, NP  methocarbamol (ROBAXIN) 750 MG tablet TAKE 1 TABLET 3 TIMES A DAY BY ORAL ROUTE AS NEEDED FOR 30 DAYS. Patient not taking: Reported on 06/17/2021 01/06/21 01/06/22  Millsaps, Joelene Millin, NP  ondansetron (ZOFRAN) 4 MG tablet Take 1 tablet (4 mg total) by mouth every 6 (six) hours as needed for nausea. Patient not taking: No sig reported 04/14/21   Owens Shark, NP  pantoprazole (PROTONIX) 40 MG tablet Take 1 tablet (40 mg total) by mouth 2 (two) times daily. Patient not  taking: Reported on 06/17/2021 02/28/21   Allie Bossier, MD  polyethylene glycol (MIRALAX / GLYCOLAX) 17 g packet Take 17 g by mouth daily as needed for moderate constipation. Patient not taking: No sig reported 02/28/21   Allie Bossier, MD  rosuvastatin (CRESTOR) 5 MG tablet TAKE 1 TABLET BY MOUTH ONCE DAILY Patient not taking: No sig reported 01/06/21 01/06/22  Everardo Beals, NP  topiramate (TOPAMAX) 50 MG tablet Take 1 tablet (50 mg total) by mouth 2 (two) times daily. Patient not taking: No sig reported 11/19/19   Charlott Rakes, MD  fluticasone (FLONASE) 50 MCG/ACT nasal spray Place 1 spray into both nostrils daily. Patient not taking: Reported on 03/21/2019 12/29/17 06/29/19  Petrucelli, Glynda Jaeger, PA-C     Vital Signs: BP (!) 149/94 (BP Location: Right Arm)   Pulse 84   Temp 99 F (37.2 C) (Oral)   Resp 13   Ht 5\' 4"  (1.626 m)   Wt 225 lb (102.1 kg)   SpO2 100%   BMI 38.62 kg/m   Physical Exam NAD, alert Abdomen: soft, non-distended.  Exquiste tenderness to right upper aspect of the insertion site.  No active bleeding/oozing. No palpable hematoma. Tube in use.   Imaging: IR GASTROSTOMY TUBE MOD SED  Result Date: 07/01/2021 INDICATION: 55 year old woman with history metastatic pancreatic cancer presents to interventional radiology for GJ tube placement for treatment of nausea and vomiting likely related to compression of the stomach from hepatic metastases. EXAM: Fluoroscopy guided GJ tube placement MEDICATIONS: Vancomycin 1 gm IV; Antibiotics were administered within 1 hour of the procedure. Glucagon 1 mg IV ANESTHESIA/SEDATION: Versed 3 mg IV; Fentanyl 150 mcg IV Moderate Sedation Time:  63 minutes The patient was continuously monitored during the procedure by the interventional radiology nurse under my direct supervision. CONTRAST:  10 mL of Omnipaque 300-administered into the gastric lumen. FLUOROSCOPY TIME:  Fluoroscopy Time: 8 minutes 12 seconds (229 mGy). COMPLICATIONS:  None immediate. PROCEDURE: Informed written consent was obtained from the patient after a thorough discussion of the procedural risks, benefits and alternatives. All questions were addressed. Maximal Sterile Barrier Technique was utilized including caps, mask, sterile gowns, sterile gloves, sterile drape, hand hygiene and skin antiseptic. A timeout was performed prior to the initiation of the procedure. The inferior margin of the liver was marked utilizing ultrasound guidance. An orogastric tube was placed with fluoroscopic guidance. The anterior abdomen was prepped and draped in sterile fashion. Ultrasound evaluation of the left upper quadrant was performed to confirm the position of the liver. The skin and subcutaneous tissues were anesthetized with 1% lidocaine. Single gastropexy was placed utilizing fluoroscopic guidance. 19 gauge needle was directed into the distended stomach with fluoroscopic guidance. A wire was advanced into the stomach. 9-French vascular sheath was placed and the orogastric tube was snared using a Gooseneck snare device. Guidewire was advanced through the orogastric tube and snared. The snare and wire were pulled out of the patient's mouth. The snare device was connected to a 20-French gastrostomy tube. The snare device and gastrostomy tube were pulled through the patient's mouth and out the anterior abdominal wall. The gastrostomy tube was cut to an  appropriate length. Contrast injection through gastrostomy tube confirmed placement within the stomach. Kumpe catheter and Glidewire advanced through the gastrostomy tube to the proximal jejunum. 9 French jejunostomy tube was inserted through the G-tube with tip positioned in the proximal jejunum. Position of the J limb was conformed by administering contrast under fluoroscopy. The gastrojejunostomy tube was flushed with normal saline. IMPRESSION: Gastrojejunostomy tube placed utilizing fluoroscopic guidance. The gastrostomy tube is 48 [place]  while the jejunostomy tube is 32 [place]. Electronically Signed   By: Miachel Roux M.D.   On: 07/01/2021 16:41   VAS [place] LOWER EXTREMITY VENOUS (DVT)  Result Date: 06/29/2021  Lower Venous DVT Study Patient Name:  Wendy Hoffman  Date of Exam:   06/29/2021 Medical Rec #: [id number]        Accession #:    [id number] Date of Birth: [birth year]        Patient Gender: F Patient Age:   89L Exam Location:  [place] Procedure:      VAS [place] LOWER EXTREMITY VENOUS (DVT) Referring Phys: [place] --------------------------------------------------------------------------------  Indications: RLE swelling.  Risk Factors: Chemotherapy Cancer Pancreatic w/ mets. Limitations: Poor ultrasound/tissue interface. Comparison Study: No previous exams Performing Technologist: Jody Hill RVT, RDMS  Examination Guidelines: A complete evaluation includes B-mode imaging, spectral Doppler, color Doppler, and power Doppler as needed of all accessible portions of each vessel. Bilateral testing is considered an integral part of a complete examination. Limited examinations for reoccurring indications may be performed as noted. The reflux portion of the exam is performed with the patient in reverse Trendelenburg.  +---------+---------------+---------+-----------+----------+--------------+ RIGHT    CompressibilityPhasicitySpontaneityPropertiesThrombus Aging +---------+---------------+---------+-----------+----------+--------------+ CFV      Full           Yes      Yes                                 +---------+---------------+---------+-----------+----------+--------------+ SFJ      Full                                                        +---------+---------------+---------+-----------+----------+--------------+ FV Prox  Full           Yes      Yes                                 +---------+---------------+---------+-----------+----------+--------------+ FV Mid   Full           Yes      Yes                                  +---------+---------------+---------+-----------+----------+--------------+ FV DistalFull           Yes      Yes                                 +---------+---------------+---------+-----------+----------+--------------+ PFV      Full                                                        +---------+---------------+---------+-----------+----------+--------------+  POP      Full           Yes      Yes                                 +---------+---------------+---------+-----------+----------+--------------+ PTV      Full                                                        +---------+---------------+---------+-----------+----------+--------------+ PERO     Full                                                        +---------+---------------+---------+-----------+----------+--------------+   +---------+---------------+---------+-----------+----------+--------------+ LEFT     CompressibilityPhasicitySpontaneityPropertiesThrombus Aging +---------+---------------+---------+-----------+----------+--------------+ CFV      Full           Yes      Yes                                 +---------+---------------+---------+-----------+----------+--------------+ SFJ      Full                                                        +---------+---------------+---------+-----------+----------+--------------+ FV Prox  Full           Yes      Yes                                 +---------+---------------+---------+-----------+----------+--------------+ FV Mid   Full           Yes      Yes                                 +---------+---------------+---------+-----------+----------+--------------+ FV DistalFull           Yes      Yes                                 +---------+---------------+---------+-----------+----------+--------------+ PFV      Full                                                         +---------+---------------+---------+-----------+----------+--------------+ POP      Full           Yes      Yes                                 +---------+---------------+---------+-----------+----------+--------------+ PTV  Full                                                        +---------+---------------+---------+-----------+----------+--------------+ PERO     Full                                                        +---------+---------------+---------+-----------+----------+--------------+     Summary: BILATERAL: - No evidence of deep vein thrombosis seen in the lower extremities, bilaterally. - No evidence of superficial venous thrombosis in the lower extremities, bilaterally. -No evidence of popliteal cyst, bilaterally. RIGHT: - Ultrasound characteristics of enlarged lymph nodes are noted in the groin.  LEFT: - Ultrasound characteristics of enlarged lymph nodes noted in the groin.  *See table(s) above for measurements and observations. Electronically signed by Ruta Hinds MD on 06/29/2021 at 7:52:11 PM.    Final     Labs:  CBC: Recent Labs    06/23/21 0500 06/24/21 0557 06/27/21 0511 07/02/21 0553  WBC 4.6 4.8 5.0 9.2  HGB 9.5* 9.8* 8.9* 9.5*  HCT 29.3* 30.0* 27.9* 29.0*  PLT 137* 150 151 170    COAGS: Recent Labs    02/20/21 2006 06/27/21 0511  INR 0.9 1.2    BMP: Recent Labs    07/21/20 1857 02/16/21 1224 06/27/21 0511 06/28/21 0500 06/29/21 0435 07/02/21 0553  NA 139   < > 140 139 139 139  K 3.7   < > 2.9* 3.1* 2.9* 3.8  CL 108   < > 108 105 105 104  CO2 22   < > 26 27 27 25   GLUCOSE 105*   < > 99 89 91 93  BUN 14   < > 9 6 <5* 6  CALCIUM 9.3   < > 8.2* 8.5* 8.1* 8.5*  CREATININE 0.76   < > 1.01* 0.85 0.74 0.58  GFRNONAA >60   < > >60 >60 >60 >60  GFRAA >60  --   --   --   --   --    < > = values in this interval not displayed.    LIVER FUNCTION TESTS: Recent Labs    06/25/21 1055 06/27/21 0511 06/29/21 0435  07/02/21 0553  BILITOT 0.7 0.6 0.7 1.0  AST 47* 59* 33 23  ALT 16 22 21 20   ALKPHOS 80 80 83 69  PROT 5.7* 5.2* 5.6* 5.3*  ALBUMIN 3.1* 3.0* 3.1* 2.9*    Assessment and Plan: Intractable nausea/vomiting in the setting of pancreatic cancer Patient s/p gastrostomy tube placement by Dr. Dwaine Gale 7/20.  Tube is already in use today.  Appears TF were started around noon today.  Site assessed.  Small amount of dried blood, but no active bleeding/oozing. There is tenderness to the right upper portion but no obvious hematoma. Could represent expected post-procedure tenderness.  Patient has informed staff of need for pain medication.  IR remains available if needed. No current needs.  Electronically Signed: Docia Barrier, PA 07/02/2021, 3:55 PM   I spent a total of 15 Minutes at the the patient's bedside AND on the patient's hospital floor or unit, greater than 50% of which was counseling/coordinating care for pancreatic  cancer.

## 2021-07-02 NOTE — Progress Notes (Signed)
Nutrition Follow-up  DOCUMENTATION CODES:   Obesity unspecified, Non-severe (moderate) malnutrition in context of chronic illness  INTERVENTION:   Monitor magnesium, potassium, and phosphorus daily for at least 3 days, MD to replete as needed, as pt is at risk for refeeding syndrome.  -Initiate Osmolite 1.5 @ 20 ml/hr via J-tube, advance by 10 ml every 12 hours to goal rate of 55 ml/hr. -Provides 1980 kcals, 82g protein and 1005 ml H2O  NUTRITION DIAGNOSIS:   Moderate Malnutrition related to chronic illness, cancer and cancer related treatments as evidenced by percent weight loss, energy intake < or equal to 75% for > or equal to 1 month, mild muscle depletion.  Ongoing.  GOAL:   Patient will meet greater than or equal to 90% of their needs  Not meeting yet.  MONITOR:   PO intake, Supplement acceptance, Labs, Weight trends, I & O's  REASON FOR ASSESSMENT:   Consult Enteral/tube feeding initiation and management  ASSESSMENT:   55 y.o. female with past medical history of metastatic pancreatic cancer with liver lesion as well as EGD in March showing large infiltrative duodenal mass.  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  Patient is s/p radiation and chemotherapy.  She was seen by oncology yesterday and was admitted to the hospital for persistent nausea and vomiting and rule out duodenal obstruction . CT abdomen pelvis without contrast negative for any obstruction.  7/6: admitted 7/8 s/p EGD 7/20: s/p G-J tube  Pt now with G-J tube. Currently NPO. Placed TF orders to start this afternoon via J portion.  Admission weight: 198 lbs. Last recorded weight 7/14: 225 lbs. Daily weights are now ordered via TF protocol.  I/Os: +21L since 7/7  Medications: Dulcolax, MAG-OX, KLOR-CON, Carafate  Labs reviewed.  Diet Order:   Diet Order             Diet NPO time specified  Diet effective now                   EDUCATION NEEDS:   No education  needs have been identified at this time  Skin:  Skin Assessment: Reviewed RN Assessment  Last BM:  7/14  Height:   Ht Readings from Last 1 Encounters:  06/24/21 5\' 4"  (1.626 m)    Weight:   Wt Readings from Last 1 Encounters:  06/25/21 102.1 kg    BMI:  Body mass index is 38.62 kg/m.  Estimated Nutritional Needs:   Kcal:  1900-2100  Protein:  85-95g  Fluid:  2L/day   Clayton Bibles, MS, RD, LDN Inpatient Clinical Dietitian Contact information available via Amion

## 2021-07-03 ENCOUNTER — Other Ambulatory Visit: Payer: Self-pay | Admitting: *Deleted

## 2021-07-03 DIAGNOSIS — C250 Malignant neoplasm of head of pancreas: Secondary | ICD-10-CM | POA: Diagnosis not present

## 2021-07-03 LAB — GLUCOSE, CAPILLARY
Glucose-Capillary: 101 mg/dL — ABNORMAL HIGH (ref 70–99)
Glucose-Capillary: 102 mg/dL — ABNORMAL HIGH (ref 70–99)
Glucose-Capillary: 102 mg/dL — ABNORMAL HIGH (ref 70–99)
Glucose-Capillary: 110 mg/dL — ABNORMAL HIGH (ref 70–99)
Glucose-Capillary: 113 mg/dL — ABNORMAL HIGH (ref 70–99)
Glucose-Capillary: 62 mg/dL — ABNORMAL LOW (ref 70–99)
Glucose-Capillary: 90 mg/dL (ref 70–99)

## 2021-07-03 LAB — PHOSPHORUS
Phosphorus: 1.8 mg/dL — ABNORMAL LOW (ref 2.5–4.6)
Phosphorus: 2.6 mg/dL (ref 2.5–4.6)

## 2021-07-03 LAB — MAGNESIUM
Magnesium: 1.2 mg/dL — ABNORMAL LOW (ref 1.7–2.4)
Magnesium: 1.6 mg/dL — ABNORMAL LOW (ref 1.7–2.4)

## 2021-07-03 NOTE — Progress Notes (Addendum)
HEMATOLOGY-ONCOLOGY PROGRESS NOTE  SUBJECTIVE: Continues to have intermittent abdominal pain.  She had vomiting overnight while her G-tube was clamped.  J-tube feeding is currently running and she is tolerating well.  Oncology History  Malignant neoplasm of head of pancreas ([place])  02/17/2021 Initial Diagnosis   Malignant neoplasm of head of pancreas ([place])    02/25/2021 Cancer Staging   Staging form: Exocrine Pancreas, AJCC 8th Edition - Clinical: Stage IV (cT3, cN0, cM1) - Signed by Ladell Pier, MD on 02/25/2021  Total positive nodes: 0    03/17/2021 -  Chemotherapy    Patient is on Treatment Plan: PANCREAS MODIFIED FOLFIRINOX Q14D X 4 CYCLES       03/23/2021 Genetic Testing   Negative hereditary cancer genetic testing: no pathogenic variants detected in [facility] Multi-Cancer Panel + Pancreatitis Genes.  The report date is March 23, 2021.   The Multi-Cancer Panel with pancreatitis genes and preliminary pancreatic cancer genes offered by [facility] includes sequencing and/or deletion duplication testing of the following 91 genes: AIP, ALK, APC, ATM, AXIN2,BAP1,  BARD1, BLM, BMPR1A, BRCA1, BRCA2, BRIP1, CASR, CDC73, CDH1, CDK4, CDKN1B, CDKN1C, CDKN2A (p14ARF), CDKN2A (p16INK4a), CEBPA, CFTR, CHEK2, CPA1, CTNNA1, CTRC, DICER1, DIS3L2, EGFR (c.2369C>T, p.Thr790Met variant only), EPCAM (Deletion/duplication testing only), FANCC, FH, FLCN, GATA2, GPC3, GREM1 (Promoter region deletion/duplication testing only), HOXB13 (c.251G>A, p.Gly84Glu), HRAS, KIT, MAX, MEN1, MET, MITF (c.952G>A, p.Glu318Lys variant only), MLH1, MSH2, MSH3, MSH6, MUTYH, NBN, NF1, NF2, NTHL1, PALB2, PALLD, PDGFRA, PHOX2B, PMS2, POLD1, POLE, POT1, PRKAR1A, PRSS1, PTCH1, PTEN, RAD50, RAD51C, RAD51D, RB1, RECQL4, RET, RNF43, RUNX1, SDHAF2, SDHA (sequence changes only), SDHB, SDHC, SDHD, SMAD4, SMARCA4, SMARCB1, SMARCE1, SPINK1, STK11, SUFU, TERC, TERT, TMEM127, TP53, TSC1, TSC2, VHL, WRN and WT1.     PHYSICAL EXAMINATION:  Vitals:    07/02/21 2045 07/03/21 0422  BP: 125/68 136/89  Pulse: 68 76  Resp: 20 18  Temp: 98.5 F (36.9 C) 97.8 F (36.6 C)  SpO2: 100% 100%   Intake/Output from previous day: 07/21 0701 - 07/22 0700 In: 3535.6 [P.O.:600; I.V.:2524.4; NG/GT:411.2] Out: -   OROPHARYNX: No thrush ABDOMEN: fullness and tenderness in the mid and rt. Upper abdomen.  GJ tube dressing without drainage.  Site without erythema.  Vascular: No leg edema LABORATORY DATA:  I have reviewed the data as listed CMP Latest Ref Rng & Units 07/02/2021 06/29/2021 06/28/2021  Glucose 70 - 99 mg/dL 93 91 89  BUN 6 - 20 mg/dL 6 <5(L) 6  Creatinine 0.44 - 1.00 mg/dL 0.58 0.74 0.85  Sodium 135 - 145 mmol/L 139 139 139  Potassium 3.5 - 5.1 mmol/L 3.8 2.9(L) 3.1(L)  Chloride 98 - 111 mmol/L 104 105 105  CO2 22 - 32 mmol/L '25 27 27  ' Calcium 8.9 - 10.3 mg/dL 8.5(L) 8.1(L) 8.5(L)  Total Protein 6.5 - 8.1 g/dL 5.3(L) 5.6(L) -  Total Bilirubin 0.3 - 1.2 mg/dL 1.0 0.7 -  Alkaline Phos 38 - 126 U/L 69 83 -  AST 15 - 41 U/L 23 33 -  ALT 0 - 44 U/L 20 21 -    Lab Results  Component Value Date   WBC 9.2 07/02/2021   HGB 9.5 (L) 07/02/2021   HCT 29.0 (L) 07/02/2021   MCV 91.2 07/02/2021   PLT 170 07/02/2021   NEUTROABS 3.8 06/22/2021    CT ABDOMEN PELVIS WO CONTRAST  Result Date: 06/17/2021 CLINICAL DATA:  Rule out bowel obstruction. History of pancreas cancer. EXAM: CT ABDOMEN AND PELVIS WITHOUT CONTRAST TECHNIQUE: Multidetector CT imaging of the abdomen  and pelvis was performed following the standard protocol without IV contrast. COMPARISON:  05/23/2021 FINDINGS: Lower chest: No acute abnormality. Hepatobiliary: Within a background of diffuse hepatic steatosis there are multiple liver metastases as noted previously: The dominant lesion within the lateral segment of left hepatic lobe measures 7.8 cm, image 26/2. Previously this measured the same. The dominant lesion within the right hepatic lobe measures 8.2 cm, image 25/2.  Previously 8 cm. Status post cholecystectomy. No biliary dilatation. Pancreas: Head of pancreas mass is suboptimally evaluated due to lack of IV contrast material. Subjectively, this does not appear significantly changed in size when compared with the previous contrast enhanced MRI. Spleen: Normal in size without focal abnormality. Adrenals/Urinary Tract: Normal adrenal glands. No hydronephrosis identified bilaterally. Punctate stone noted within inferior pole of left kidney. Exophytic cyst is again noted arising off the posterior cortex of the interpolar right kidney measuring 1.3 cm. Urinary bladder appears collapsed. Stomach/Bowel: Stomach is nondistended. The appendix is visualized and appears normal. No pathologic dilatation of the large or small bowel loops. No significant bowel wall thickening or surrounding inflammatory fat stranding. Vascular/Lymphatic: Mild aortic atherosclerosis. No aneurysm. No abdominopelvic adenopathy identified. Reproductive: Status post hysterectomy. No adnexal masses.  The Other: No ascites or focal fluid collections. No peritoneal nodularity identified at this time. Musculoskeletal: No acute or significant osseous findings. IMPRESSION: 1. No acute findings within the abdomen or pelvis. No evidence for bowel obstruction. 2. Multiple liver metastases.  Similar to previous exam. 3. Head of pancreas mass is suboptimally evaluated due to lack of IV contrast material. Subjectively, this does not appear significantly changed in size when compared with the previous contrast enhanced MRI. 4. Aortic atherosclerosis. Aortic Atherosclerosis (ICD10-I70.0). Electronically Signed   By: Kerby Moors M.D.   On: 06/17/2021 19:07   DG THORACOLUMABAR SPINE  Result Date: 06/28/2021 CLINICAL DATA:  Low back pain for 1 day, history of metastatic pancreatic cancer EXAM: THORACOLUMBAR SPINE 1V COMPARISON:  06/26/2021 FINDINGS: Frontal and lateral views of the thoracolumbar spine are obtained. There  are no acute or destructive bony lesions. Mild spondylosis is seen within the mid to lower thoracic spine. Severe facet hypertrophic changes are seen within the mid to lower lumbar spine. Sacroiliac joints are normal. Visualized bowel gas pattern is unremarkable. IMPRESSION: 1. Mild mid to lower thoracic spondylosis, with severe lower lumbar facet hypertrophy. 2. No acute or destructive bony lesions. Electronically Signed   By: Randa Ngo M.D.   On: 06/28/2021 15:22   CT ABDOMEN PELVIS W CONTRAST  Result Date: 06/26/2021 CLINICAL DATA:  Worsening nausea and vomiting over the last week, pancreatic cancer undergoing chemotherapy EXAM: CT ABDOMEN AND PELVIS WITH CONTRAST TECHNIQUE: Multidetector CT imaging of the abdomen and pelvis was performed using the standard protocol following bolus administration of intravenous contrast. CONTRAST:  103m OMNIPAQUE IOHEXOL 350 MG/ML SOLN COMPARISON:  06/17/2021, 05/23/2021, 02/16/2021 FINDINGS: Lower chest: No acute pleural or parenchymal lung disease. Central venous catheter tip within the right atrium near the junction with the IVC. No pericardial effusion. Hepatobiliary: Multiple hepatic masses are again identified. Largest lesion within the left lobe liver reference image 27/2 measures 8.0 x 6.1 cm, previously measuring 7.6 x 6.0 cm on MRI 05/23/2021. Largest lesion within the right lobe liver measures up to 8.2 x 4.5 cm on image 23/2, previously measuring 7.6 x 3.7 cm on MRI. Smaller lesions throughout the left lobe liver more inferiorly are grossly unchanged, though less well visualized on CT compared to MRI. No new lesions are  definitively identified. No intrahepatic biliary duct dilation. Gallbladder is surgically absent. Pancreas: The ill-defined mass within the pancreatic head measures up to approximately 2.8 x 3.1 cm reference image 65/2, previously having measured 2.9 x 2.9 cm on MRI 05/23/2021. Mild pancreatic duct dilation and upstream pancreatic parenchymal  atrophy again noted unchanged. Spleen: No focal splenic abnormalities. Mild splenomegaly unchanged. Adrenals/Urinary Tract: No urinary tract calculi or obstructive uropathy within either kidney. The kidneys enhance normally and symmetrically. Small left renal cortical cysts unchanged. The adrenals are unremarkable. Bladder is only minimally distended with no gross abnormalities. Stomach/Bowel: No bowel obstruction or ileus. Normal retrocecal appendix. No bowel wall thickening or inflammatory change. Vascular/Lymphatic: Mild atherosclerosis of the aorta and iliac vessels unchanged. No discrete adenopathy within the abdomen or pelvis. Reproductive: Status post hysterectomy. No adnexal masses. Other: Trace pelvic free fluid. No free intraperitoneal gas. No abdominal wall hernia. Musculoskeletal: No acute or destructive bony lesions. Reconstructed images demonstrate no additional findings. IMPRESSION: 1. Grossly stable mass within the pancreatic head compatible with known pancreatic cancer. 2. No significant change in the size and number of known liver metastases, allowing for differences in technique compared to previous MRI. 3. Trace pelvic free fluid. 4.  Aortic Atherosclerosis (ICD10-I70.0). Electronically Signed   By: Randa Ngo M.D.   On: 06/26/2021 17:01   IR GASTROSTOMY TUBE MOD SED  Result Date: 07/01/2021 INDICATION: 55 year old woman with history metastatic pancreatic cancer presents to interventional radiology for [place] tube placement for treatment of nausea and vomiting likely related to compression of the stomach from hepatic metastases. EXAM: Fluoroscopy guided GJ tube placement MEDICATIONS: Vancomycin 1 gm IV; Antibiotics were administered within 1 hour of the procedure. Glucagon 1 mg IV ANESTHESIA/SEDATION: Versed 3 mg IV; Fentanyl 150 mcg IV Moderate Sedation Time:  63 minutes The patient was continuously monitored during the procedure by the interventional radiology nurse under my direct  supervision. CONTRAST:  10 mL of Omnipaque 300-administered into the gastric lumen. FLUOROSCOPY TIME:  Fluoroscopy Time: 8 minutes 12 seconds (229 mGy). COMPLICATIONS: None immediate. PROCEDURE: Informed written consent was obtained from the patient after a thorough discussion of the procedural risks, benefits and alternatives. All questions were addressed. Maximal Sterile Barrier Technique was utilized including caps, mask, sterile gowns, sterile gloves, sterile drape, hand hygiene and skin antiseptic. A timeout was performed prior to the initiation of the procedure. The inferior margin of the liver was marked utilizing ultrasound guidance. An orogastric tube was placed with fluoroscopic guidance. The anterior abdomen was prepped and draped in sterile fashion. Ultrasound evaluation of the left upper quadrant was performed to confirm the position of the liver. The skin and subcutaneous tissues were anesthetized with 1% lidocaine. Single gastropexy was placed utilizing fluoroscopic guidance. 19 gauge needle was directed into the distended stomach with fluoroscopic guidance. A wire was advanced into the stomach. 9-French vascular sheath was placed and the orogastric tube was snared using a [place]ck snare device. Guidewire was advanced through the orogastric tube and snared. The snare and wire were pulled out of the patient's mouth. The snare device was connected to a 20-French gastrostomy tube. The snare device and gastrostomy tube were pulled through the patient's mouth and out the anterior abdominal wall. The gastrostomy tube was cut to an appropriate length. Contrast injection through gastrostomy tube confirmed placement within the stomach. Kumpe catheter and Glidewire advanced through the gastrostomy tube to the proximal jejunum. 9 French jejunostomy tube was inserted through the G-tube with tip positioned in the proximal jejunum. Position of  the J limb was conformed by administering contrast under fluoroscopy.  The gastrojejunostomy tube was flushed with normal saline. IMPRESSION: Gastrojejunostomy tube placed utilizing fluoroscopic guidance. The gastrostomy tube is 27 [place] while the jejunostomy tube is 33 [place]. Electronically Signed   By: Miachel Roux M.D.   On: 07/01/2021 16:41   VAS [place] LOWER EXTREMITY VENOUS (DVT)  Result Date: 06/29/2021  Lower Venous DVT Study Patient Name:  Wendy Hoffman  Date of Exam:   06/29/2021 Medical Rec #: [id number]        Accession #:    [id number] Date of Birth: 05-03-1966        Patient Gender: F Patient Age:   89T Exam Location:  [facility] Procedure:      VAS [place] LOWER EXTREMITY VENOUS (DVT) Referring Phys: [place] --------------------------------------------------------------------------------  Indications: RLE swelling.  Risk Factors: Chemotherapy Cancer Pancreatic w/ mets. Limitations: Poor ultrasound/tissue interface. Comparison Study: No previous exams Performing Technologist: Jody Hill RVT, RDMS  Examination Guidelines: A complete evaluation includes B-mode imaging, spectral Doppler, color Doppler, and power Doppler as needed of all accessible portions of each vessel. Bilateral testing is considered an integral part of a complete examination. Limited examinations for reoccurring indications may be performed as noted. The reflux portion of the exam is performed with the patient in reverse Trendelenburg.  +---------+---------------+---------+-----------+----------+--------------+ RIGHT    CompressibilityPhasicitySpontaneityPropertiesThrombus Aging +---------+---------------+---------+-----------+----------+--------------+ CFV      Full           Yes      Yes                                 +---------+---------------+---------+-----------+----------+--------------+ SFJ      Full                                                        +---------+---------------+---------+-----------+----------+--------------+ FV Prox  Full            Yes      Yes                                 +---------+---------------+---------+-----------+----------+--------------+ FV Mid   Full           Yes      Yes                                 +---------+---------------+---------+-----------+----------+--------------+ FV DistalFull           Yes      Yes                                 +---------+---------------+---------+-----------+----------+--------------+ PFV      Full                                                        +---------+---------------+---------+-----------+----------+--------------+ POP      Full  Yes      Yes                                 +---------+---------------+---------+-----------+----------+--------------+ PTV      Full                                                        +---------+---------------+---------+-----------+----------+--------------+ PERO     Full                                                        +---------+---------------+---------+-----------+----------+--------------+   +---------+---------------+---------+-----------+----------+--------------+ LEFT     CompressibilityPhasicitySpontaneityPropertiesThrombus Aging +---------+---------------+---------+-----------+----------+--------------+ CFV      Full           Yes      Yes                                 +---------+---------------+---------+-----------+----------+--------------+ SFJ      Full                                                        +---------+---------------+---------+-----------+----------+--------------+ FV Prox  Full           Yes      Yes                                 +---------+---------------+---------+-----------+----------+--------------+ FV Mid   Full           Yes      Yes                                 +---------+---------------+---------+-----------+----------+--------------+ FV DistalFull           Yes      Yes                                  +---------+---------------+---------+-----------+----------+--------------+ PFV      Full                                                        +---------+---------------+---------+-----------+----------+--------------+ POP      Full           Yes      Yes                                 +---------+---------------+---------+-----------+----------+--------------+ PTV      Full                                                        +---------+---------------+---------+-----------+----------+--------------+  PERO     Full                                                        +---------+---------------+---------+-----------+----------+--------------+     Summary: BILATERAL: - No evidence of deep vein thrombosis seen in the lower extremities, bilaterally. - No evidence of superficial venous thrombosis in the lower extremities, bilaterally. -No evidence of popliteal cyst, bilaterally. RIGHT: - Ultrasound characteristics of enlarged lymph nodes are noted in the groin.  LEFT: - Ultrasound characteristics of enlarged lymph nodes noted in the groin.  *See table(s) above for measurements and observations. Electronically signed by Ruta Hinds MD on 06/29/2021 at 7:52:11 PM.    Final     ASSESSMENT AND PLAN: 1. Pancreas cancer -CT abdomen/pelvis without contrast 02/16/2021-multiple large hypoechoic masses in the patient's liver consistent metastatic disease, ill-defined masslike area in the pancreatic head measuring approximate 4.7 cm, possible filling defect in the right ventricle. -EGD 02/19/2021-large infiltrative mass with bleeding in the second portion of the duodenum, appears to be arising near the ampulla, partially obstructive with friable mucosa.  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 -Flexible sigmoidoscopy 02/19/2021-diverticulosis in the sigmoid colon, nonbleeding external and internal hemorrhoids -Cardiac CT 02/20/2021-mass in the mid RV attached to  the interventricular septum measuring 16 mm x 6 mm and concerning for metastatic tumor -MRI abdomen with/without contrast 02/21/2021-mass in the posterior pancreatic head measuring 4.3 x 3.8 cm with obstruction of the central pancreatic duct and diffuse dilatation up to 6 mm consistent with pancreatic adenocarcinoma, liver lesions the largest mass of the central left lobe measuring 9.9 x 9.2 cm consistent with hepatic metastatic disease,  hepatomegaly. -Ultrasound-guided biopsy of a left liver lesion 02/24/2021-adenocarcinoma, morphologically similar to the duodenal biopsy; microsatellite stable, tumor mutation burden 1 -Negative genetic testing -Palliative radiation to the duodenal mass 02/25/2021-03/06/2021 -Cycle 1 FOLFOX 03/17/2021 -Cycle 2 FOLFOX 04/01/2021 -Cycle 3 FOLFIRINOX 04/14/2021 -Cycle 4 FOLFIRINOX 04/27/2021, Udenyca added -Cycle 5 FOLFIRINOX 05/12/2021, Udenyca -MRI abdomen 05/23/2021-decrease in size of pancreas mass and hepatic lesions, no progressive disease -Cycle 6 FOLFIRINOX 05/27/2021 2.  Iron deficiency anemia-improved -Feraheme 510 mg 02/20/2021 3.  Protein calorie malnutrition 4.  Possible right ventricular filling defect on noncontrast CT scan MRI cardiac morphology 02/21/2021-mass in the mid RV attached to the interventricular septum measures 16 mm x 6 mm.  Mass appears heterogeneous with area of enhancement on LGE imaging.  Mass is concerning for metastatic tumor.  Normal LV size and systolic function.  Normal RV size and systolic function. 5.  Hypertension 6.  History of CVA 7.  Hyperlipidemia 8.  Hypothyroidism 9.  Morbid obesity 10.  Asthma 11.  Migraines 12.  Pain secondary to #1 13.  Port-A-Cath placement 02/25/2021 14.  Hypokalemia 04/01/2021-started a potassium supplement 15.  Hospital admission 06/17/2021-intractable nausea and vomiting EGD 06/19/2021-diffuse gastritis, nonobstructing duodenal mass, biopsy of stomach-no malignancy or H. pylori, duodenal  mass-adenocarcinoma  Wendy Hoffman appears stable.  She does continue to have persistent abdominal pain.  Pain overall controlled with current pain medication.  She is receiving nutrition through her J-tube and tolerating well.  She did develop an episode of vomiting overnight, but G-tube was clamped.  Discussed with nursing regarding need for G-tube to gravity.  The goal of the tube placement is to provide a venting gastrostomy and nutrition.  Feedings have been started by the dietitian.  We will see if we can cycle her feedings so that she is not hooked up continuously.  The CA 19-9 is stable.  The plan is to resume systemic therapy as an outpatient.  We will most likely make a change to gemcitabine/Abraxane.  Recommendations: 1.  Continue antacid regimen. 2.  Dietitian consult to manage tube feeding.  Liquid diet as tolerated.  Arrange for home tube feedings.  We will asked dietitian if feedings can be cycled so the patient is not hooked to continuous feeding.  Evaluation for refeeding syndrome per the medical service 3.  G-tube to drain to gravity.  Discussed with nursing. 4.  Continue MS Contin and oxycodone elixir for breakthrough pain.  Outpatient follow-up to be scheduled at the cancer center at the end of next week to discuss additional chemotherapy.  Please call oncology over the weekend as needed.   LOS: 16 days   Mikey Bussing 07/03/21 Wendy Hoffman was interviewed and examined.  Her pain is stable.  She appears to be tolerating the tube feedings well.  We recommend a venting bag to be placed on the G-tube port.  Hopefully the tube feedings can be cycled so that she has not connected 24 hours a day.  Outpatient follow-up will be scheduled at the Cancer center for 07/09/2021.  I was present for greater than 50% of today's visit.  I performed medical decision making.

## 2021-07-03 NOTE — Progress Notes (Signed)
Nutrition Follow-up  DOCUMENTATION CODES:   Obesity unspecified, Non-severe (moderate) malnutrition in context of chronic illness  INTERVENTION:   Monitor magnesium, potassium, and phosphorus daily for at least 3 days, MD to replete as needed, as pt is at risk for refeeding syndrome.  -Continue to advance Osmolite 1.5 @ 30 ml/hr by 10 ml every 12 hours to goal of 55 ml/hr (x 24 hours). -Provides 1980 kcals, 82g protein and 1005 ml H2O  -Home goal rate will be to run Osmolite 1.5 @ 85 ml/hr x 16 hours. Advance by 10 ml every 24 hours. -Free water recommendation: 240 ml TID via tube or PO -Provides 2040 kcals, 85g protein and 1756 ml H2O  -Boost Breeze po TID, each supplement provides 250 kcal and 9 grams of protein  NUTRITION DIAGNOSIS:   Moderate Malnutrition related to chronic illness, cancer and cancer related treatments as evidenced by percent weight loss, energy intake < or equal to 75% for > or equal to 1 month, mild muscle depletion.  Ongoing.  GOAL:   Patient will meet greater than or equal to 90% of their needs  Progressing with TF  MONITOR:   PO intake, Supplement acceptance, Labs, Weight trends, I & O's  REASON FOR ASSESSMENT:   Consult Enteral/tube feeding initiation and management  ASSESSMENT:   55 y.o. female with past medical history of metastatic pancreatic cancer with liver lesion as well as EGD in March showing large infiltrative duodenal mass.  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  Patient is s/p radiation and chemotherapy.  She was seen by oncology yesterday and was admitted to the hospital for persistent nausea and vomiting and rule out duodenal obstruction . CT abdomen pelvis without contrast negative for any obstruction.  Patient currently receiving Osmolite 1.5 @ 30 ml/hr via J-tube. Pt tolerating, denies nausea at this time. Pt with many questions of which RD was unable to answer. Pt asking when she is going home, asking if  she can eat solid foods, etc.  Answered all questions regarding TF and clear liquids. Pt will need a pump for home and case management will need to see.  Pt showing signs of refeeding syndrome, low Mg and Phos levels. Discussed with RN outside of room.  Pt won't be at goal for TF until tomorrow. Discussed 16 hour feeds with pt at bedside and she expressed understanding. Will write recommendations in discharge summary and route note to [place] RD as well.  Admission weight: 225 lbs. Current weight: 212 lbs.  Medications: MAG-OX, KLOR-CON, Carafate  Labs reviewed: CBGs: 62-113 Low Phos (1.8), Mg (1.2)  Diet Order:   Diet Order             Diet clear liquid Room service appropriate? Yes; Fluid consistency: Thin  Diet effective now                   EDUCATION NEEDS:   No education needs have been identified at this time  Skin:  Skin Assessment: Reviewed RN Assessment  Last BM:  7/14  Height:   Ht Readings from Last 1 Encounters:  06/24/21 '5\' 4"'$  (1.626 m)    Weight:   Wt Readings from Last 1 Encounters:  07/03/21 96.4 kg    BMI:  Body mass index is 36.48 kg/m.  Estimated Nutritional Needs:   Kcal:  1900-2100  Protein:  85-95g  Fluid:  2L/day   Clayton Bibles, MS, RD, LDN Inpatient Clinical Dietitian Contact information available via Amion

## 2021-07-03 NOTE — Progress Notes (Signed)
MD will see patient on 7/28 w/labs. Scheduling message sent.

## 2021-07-03 NOTE — Progress Notes (Signed)
Mobility Specialist - Progress Note     07/03/21 1700  Mobility  Activity Ambulated in hall  Level of Assistance Standby assist, set-up cues, supervision of patient - no hands on  Assistive Device Cane  Distance Ambulated (ft) 270 ft  Mobility Ambulated with assistance in hallway  Mobility Response Tolerated well  Mobility performed by Mobility specialist (2:20-2:50)  $Mobility charge 1 Mobility    Pt ambulated 270 ft in hallway using cane. Pt did c/o of abdominal pain caused by her drain towards end of ambulation. Pt required 1 standing rest break and shortly returned to room. Pt ambulated to [facility] to use bathroom and then ambulated to bed. Pt left in bed with call bell at side.   [place] Specialist Acute Rehabilitation Services Phone: [phone] 07/03/21, 5:04 PM

## 2021-07-03 NOTE — Discharge Instructions (Signed)
Tube feeding recommendations: 24 hour feeds: run Osmolite 1.5 @ 55 ml/hr via J-tube 16 hour feeds: run Osmolite 1.5 @ 85 ml/hr via J-tube Free water of 240 ml (8oz) three times daily either via tube or by mouth.

## 2021-07-03 NOTE — Progress Notes (Signed)
PROGRESS NOTE  Wendy Hoffman [id number] DOB: [birth year] DOA: 06/17/2021 PCP: Patient, No Pcp Per (Inactive)  HPI/Recap of past 60 hours: 55 year old female past medical history of pancreatic cancer, hypertension, morbid obesity and COPD admitted on 7/6 for intractable nausea/vomiting and found to be in acute kidney injury.  Despite hydration, attempts to advance diet led to recurrence of nausea and vomiting.  GJ tube placed by interventional radiology on 7/20.  Patient also found to have a mild UTI and completed 3 days of antibiotics.  Patient doing better with combination of scheduled extended release pain medication and as needed for breakthrough.  Tube feeds initiated after tube placement.  Patient is tolerating so far.  Minimal nausea as long as she has scopolamine patch.  Assessment/Plan: Principal Problem:   Nausea and vomiting: Secondary to invasive adenocarcinoma.  Status post GJ tube placed.  Osmolite started.  Patient does not complain of nausea with scopolamine patch.  Slowly adjusting, watching for refeeding syndrome. Active Problems:   Migraine   Hypothyroidism: Continue Synthroid    Tobacco abuse: Nicotine patch.    COPD (chronic obstructive pulmonary disease) ([place]): Stable.    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  Status post palliative radiation.  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  Pain better controlled    Iron deficiency anemia due to chronic blood loss: Status post iron Feraheme      Hypokalemia/hypomagnesemia: Secondary to nausea and vomiting.  Replacing.    ARF (acute renal failure) (HCC)/AKI: Secondary to nausea and vomiting and poor p.o. intake.  Resolved with IV fluids.    Malnutrition of moderate degree: Nutrition Status: Nutrition Problem: Moderate Malnutrition Etiology: chronic illness, cancer and cancer related treatments Signs/Symptoms: percent weight  loss, energy intake < or equal to 75% for > or equal to 1 month, mild muscle depletion: Seen by nutrition.  Osmolite tube feeds initiated  Morbid obesity: Meets criteria with BMI greater than 35+ hypertension and hyperlipidemia  E. coli UTI: Status post 3 days antibiotics.  Essential hypertension: Continue home medications.  Code Status: Full code  Family Communication: Left message for family  Disposition Plan: Potential discharge once tube feed rate set I & D home health found. (Issues due to insurance)   Consultants: Oncology Interventional radiology Gastroenterology  Procedures: GJ tube placement 7/20 EGD done 7/8  Antimicrobials: Completed course of Rocephin Completed course of Diflucan  DVT prophylaxis: SCDs  Level of care: Med-Surg   Objective: Vitals:   07/03/21 0422 07/03/21 1403  BP: 136/89 127/82  Pulse: 76 82  Resp: 18 16  Temp: 97.8 F (36.6 C) 98.5 F (36.9 C)  SpO2: 100% 100%    Intake/Output Summary (Last 24 hours) at 07/03/2021 1747 Last data filed at 07/03/2021 1500 Gross per 24 hour  Intake 4135.59 ml  Output --  Net 4135.59 ml    Filed Weights   06/25/21 1100 07/03/21 0403  Weight: 102.1 kg 96.4 kg   Body mass index is 36.48 kg/m.  Exam:  General: Alert and oriented x3, no acute distress, fatigued Cardiovascular: Regular rate and rhythm, S1-S2 Respiratory: Clear to auscultation bilaterally Abdomen: Soft, mild distention, nonspecific tenderness, hypoactive bowel sounds Musculoskeletal: No clubbing or cyanosis or edema Skin: No skin breaks, tears or lesions Psychiatry: Appropriate, no evidence of psychoses Neurology: No focal deficits   Data Reviewed: CBC: Recent Labs  Lab 06/27/21 0511 07/02/21 0553  WBC 5.0 9.2  HGB 8.9* 9.5*  HCT 27.9* 29.0*  MCV 91.5 91.2  PLT 151 [id number]    Basic Metabolic Panel: Recent Labs  Lab 06/27/21 0511 06/28/21 0500 06/29/21 0435 07/02/21 0553 07/02/21 1250 07/02/21 1730  07/03/21 0421  NA 140 139 139 139  --   --   --   K 2.9* 3.1* 2.9* 3.8  --   --   --   CL 108 105 105 104  --   --   --   CO2 '26 27 27 25  '$ --   --   --   GLUCOSE 99 89 91 93  --   --   --   BUN 9 6 <5* 6  --   --   --   CREATININE 1.01* 0.85 0.74 0.58  --   --   --   CALCIUM 8.2* 8.5* 8.1* 8.5*  --   --   --   MG  --   --  1.6*  --  1.7 1.7 1.2*  PHOS  --   --   --   --  3.2 2.9 1.8*    GFR: Estimated Creatinine Clearance: 91.7 mL/min (by C-G formula based on SCr of 0.58 mg/dL). Liver Function Tests: Recent Labs  Lab 06/27/21 0511 06/29/21 0435 07/02/21 0553  AST 59* 33 23  ALT '22 21 20  '$ ALKPHOS 80 83 69  BILITOT 0.6 0.7 1.0  PROT 5.2* 5.6* 5.3*  ALBUMIN 3.0* 3.1* 2.9*    No results for input(s): LIPASE, AMYLASE in the last 168 hours. No results for input(s): AMMONIA in the last 168 hours. Coagulation Profile: Recent Labs  Lab 06/27/21 0511  INR 1.2    Cardiac Enzymes: No results for input(s): CKTOTAL, CKMB, CKMBINDEX, TROPONINI in the last 168 hours. BNP (last 3 results) No results for input(s): PROBNP in the last 8760 hours. HbA1C: No results for input(s): HGBA1C in the last 72 hours. CBG: Recent Labs  Lab 07/03/21 0426 07/03/21 0457 07/03/21 0802 07/03/21 1129 07/03/21 1632  GLUCAP 62* 102* 113* 90 101*   Lipid Profile: No results for input(s): CHOL, HDL, LDLCALC, TRIG, CHOLHDL, LDLDIRECT in the last 72 hours. Thyroid Function Tests: No results for input(s): TSH, T4TOTAL, FREET4, T3FREE, THYROIDAB in the last 72 hours. Anemia Panel: No results for input(s): VITAMINB12, FOLATE, FERRITIN, TIBC, IRON, RETICCTPCT in the last 72 hours. Urine analysis:    Component Value Date/Time   COLORURINE YELLOW 06/18/2021 1000   APPEARANCEUR CLOUDY (A) 06/18/2021 1000   LABSPEC 1.011 06/18/2021 1000   PHURINE 6.0 06/18/2021 1000   GLUCOSEU NEGATIVE 06/18/2021 1000   HGBUR MODERATE (A) 06/18/2021 1000   BILIRUBINUR NEGATIVE 06/18/2021 1000   KETONESUR NEGATIVE  06/18/2021 1000   PROTEINUR 30 (A) 06/18/2021 1000   UROBILINOGEN 0.2 09/08/2014 1800   NITRITE NEGATIVE 06/18/2021 1000   LEUKOCYTESUR LARGE (A) 06/18/2021 1000   Sepsis Labs: '@LABRCNTIP'$ (procalcitonin:4,lacticidven:4)  )No results found for this or any previous visit (from the past 240 hour(s)).    Studies: No results found.  Scheduled Meds:  amLODipine  10 mg Oral Daily   bisacodyl  10 mg Oral Daily   Chlorhexidine Gluconate Cloth  6 each Topical Daily   feeding supplement  1 Container Oral TID BM   feeding supplement (OSMOLITE 1.5 CAL)  1,000 mL Per Tube Q24H   heparin lock flush  500 Units Intracatheter Q30 days   magnesium oxide  800 mg Oral Daily   morphine  60 mg Oral Q12H   neomycin-bacitracin-polymyxin  1 application Topical Daily   pantoprazole  40 mg Oral BID   potassium chloride  20 mEq Oral Daily   scopolamine  1 patch Transdermal Q72H   sodium chloride flush  10-40 mL Intracatheter Q12H   sucralfate  1 g Oral Q6H    Continuous Infusions:  0.9 % NaCl with KCl 40 mEq / L 100 mL/hr at 07/03/21 1743   promethazine (PHENERGAN) injection (IM or IVPB) 200 mL/hr at 06/26/21 1616     LOS: 16 days     Annita Brod, MD [facility]   07/03/2021, 5:47 PM

## 2021-07-04 LAB — GLUCOSE, CAPILLARY
Glucose-Capillary: 101 mg/dL — ABNORMAL HIGH (ref 70–99)
Glucose-Capillary: 101 mg/dL — ABNORMAL HIGH (ref 70–99)
Glucose-Capillary: 106 mg/dL — ABNORMAL HIGH (ref 70–99)
Glucose-Capillary: 90 mg/dL (ref 70–99)
Glucose-Capillary: 96 mg/dL (ref 70–99)

## 2021-07-04 MED ORDER — MAGNESIUM SULFATE 2 GM/50ML IV SOLN
2.0000 g | Freq: Once | INTRAVENOUS | Status: AC
  Administered 2021-07-04: 2 g via INTRAVENOUS
  Filled 2021-07-04: qty 50

## 2021-07-04 MED ORDER — ZOLPIDEM TARTRATE 10 MG PO TABS
10.0000 mg | ORAL_TABLET | Freq: Every day | ORAL | Status: DC
  Administered 2021-07-04 – 2021-07-06 (×2): 10 mg via ORAL
  Filled 2021-07-04 (×2): qty 1

## 2021-07-04 NOTE — Progress Notes (Signed)
PROGRESS NOTE    Wendy Hoffman   [id number]  DOB: [birth year]  PCP: Patient, No Pcp Per (Inactive)    DOA: 06/17/2021 LOS: 17   Assessment & Plan   Principal Problem:   Nausea and vomiting Active Problems:   Migraine   Hypertension   Hypothyroidism   Tobacco abuse   Morbid obesity ([place])   COPD (chronic obstructive pulmonary disease) ([employer])   Malignant neoplasm of head of pancreas (HCC)   Iron deficiency anemia due to chronic blood loss   Duodenal adenocarcinoma ([place]): 02/19/2021 per EGD   Hypokalemia   ARF (acute renal failure) (HCC)   Malnutrition of moderate degree   Hypomagnesemia   Oral candidiasis   E-coli UTI   Nausea and vomiting: Secondary to invasive adenocarcinoma.  Status post GJ tube placed.  Osmolite tube feeds started.  Nausea seems controlled with scopolamine patch.   Slowly adjusting TF's  Monitor for refeeding syndrome.   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  Status post palliative radiation.  Metastatic tumor in the right ventricle although echo unremarkable.  Pain better controlled   Iron deficiency anemia due to chronic blood loss: Status post IV iron infusion, Feraheme    Hypokalemia/hypomagnesemia: Secondary to nausea and vomiting.  Replacing.  Monitor labs, replace as needed.   AKI: Secondary to nausea and vomiting and poor p.o. intake.  Resolved with IV fluids.  COPD: Stable, no exacerbation s/sx's.  Monitor.   Malnutrition of moderate degree: Nutrition Status: Nutrition Problem: Moderate Malnutrition Etiology: chronic illness, cancer and cancer related treatments Signs/Symptoms: percent weight loss, energy intake < or equal to 75% for > or equal to 1 month, mild muscle depletion: Seen by nutrition.  Osmolite tube feeds initiated   Hypothyroidism: continue Synthroid  Hx of migraine: stable, monitor.   E. coli UTI: Completed 3 day course antibiotics.   Essential hypertension:  Continue home medications.  Tobacco abuse: Nicotine patch.  Morbid Obesity: Body mass index is 38.4 kg/m.  With BMI>35 plus HTN and HLD.  DVT prophylaxis: Place and maintain sequential compression device Start: 06/18/21 1807 SCDs Start: 06/17/21 1554   Diet:  Diet Orders (From admission, onward)     Start     Ordered   07/03/21 [id number]  Diet clear liquid Room service appropriate? Yes; Fluid consistency: Thin  Diet effective now       Question Answer Comment  Room service appropriate? Yes   Fluid consistency: Thin      07/03/21 [id number]              Code Status: Full Code   Brief Narrative / Hospital Course to Date:   55 year old female past medical history of pancreatic cancer, hypertension, morbid obesity and COPD admitted on 7/6 for intractable nausea/vomiting and found to be in acute kidney injury.  Despite hydration, attempts to advance diet led to recurrence of nausea and vomiting.  GJ tube placed by interventional radiology on 7/20.  Patient also found to have a mild UTI and completed 3 days of antibiotics.  Patient doing better with combination of scheduled extended release pain medication and as needed for breakthrough.  Tube feeds initiated after tube placement and patient tolerating well. Nausea controlled with scopolamine patch in place.  Subjective 07/04/21    Pt up in recliner when seen.  Tolerating TF's.  Denies nausea/vomiting.  Having intermittent pain with focal epigastric pain with protrudinng mass she states gets larger just before episodes of increased pain.   Disposition  Plan & Communication   Status is: Inpatient  Remains inpatient appropriate because:Inpatient level of care appropriate due to severity of illness.  Electrolyte abnormalities, adjusting tube feeds  Dispo: The patient is from: Home              Anticipated d/c is to: Home              Patient currently is not medically stable to d/c.   Difficult to place patient No    Consults,  Procedures, Significant Events   Consultants:  Oncology Interventional radiology Gastroenterology  Procedures:  GJ tube placement 7/20 EGD done 7/8  Antimicrobials:  Anti-infectives (From admission, onward)    Start     Dose/Rate Route Frequency Ordered Stop   07/01/21 1427  vancomycin (VANCOCIN) IVPB 1000 mg/200 mL premix        over 60 Minutes Intravenous Continuous PRN 07/01/21 1428 07/01/21 1427   06/29/21 1500  vancomycin (VANCOREADY) IVPB 1500 mg/300 mL        1,500 mg 150 mL/hr over 120 Minutes Intravenous To Radiology 06/26/21 1705 06/29/21 1800   06/26/21 1800  vancomycin (VANCOREADY) IVPB 1500 mg/300 mL  Status:  Discontinued        1,500 mg 150 mL/hr over 120 Minutes Intravenous To Radiology 06/26/21 1704 06/26/21 1705   06/25/21 1200  erythromycin 500 mg in sodium chloride 0.9 % 100 mL IVPB  Status:  Discontinued        500 mg 100 mL/hr over 60 Minutes Intravenous Every 8 hours 06/25/21 0926 07/01/21 0826   06/21/21 1000  cefTRIAXone (ROCEPHIN) 1 g in sodium chloride 0.9 % 100 mL IVPB        1 g 200 mL/hr over 30 Minutes Intravenous Every 24 hours 06/20/21 1634 06/21/21 1218   06/19/21 1300  levofloxacin (LEVAQUIN) IVPB 500 mg  Status:  Discontinued        500 mg 100 mL/hr over 60 Minutes Intravenous Daily 06/19/21 1212 06/20/21 1634   06/17/21 1800  fluconazole (DIFLUCAN) IVPB 200 mg  Status:  Discontinued        200 mg 100 mL/hr over 60 Minutes Intravenous Daily-1800 06/17/21 1558 06/23/21 1640         Micro    Objective   Vitals:   07/03/21 2141 07/04/21 0400 07/04/21 0500 07/04/21 1401  BP: 134/88 118/85  127/81  Pulse: 91 82  67  Resp: '18 18  18  '$ Temp: (!) 97.5 F (36.4 C) 97.8 F (36.6 C)  97.9 F (36.6 C)  TempSrc: Oral Oral  Oral  SpO2: 99% 100%  100%  Weight:   101.5 kg   Height:        Intake/Output Summary (Last 24 hours) at 07/04/2021 1513 Last data filed at 07/04/2021 0545 Gross per 24 hour  Intake --  Output 950 ml  Net -950 ml    Filed Weights   06/25/21 1100 07/03/21 0403 07/04/21 0500  Weight: 102.1 kg 96.4 kg 101.5 kg    Physical Exam:  General exam: awake, alert, no acute distress Respiratory system: CTAB diminished bases, normal respiratory effort. Cardiovascular system: normal S1/S2, RRR, no pedal edema.   Gastrointestinal system: palpable epigastric mass tender, soft, non-distended Central nervous system: A&O x3. no gross focal neurologic deficits, normal speech Extremities: moves all, no edema, normal tone Psychiatry: normal mood, congruent affect, judgement and insight appear normal  Labs   Data Reviewed: I have personally reviewed following labs and imaging studies  CBC: Recent Labs  Lab  07/02/21 0553  WBC 9.2  HGB 9.5*  HCT 29.0*  MCV 91.2  PLT [id number]   Basic Metabolic Panel: Recent Labs  Lab 06/28/21 0500 06/29/21 0435 07/02/21 0553 07/02/21 1250 07/02/21 1730 07/03/21 0421 07/03/21 1737  NA 139 139 139  --   --   --   --   K 3.1* 2.9* 3.8  --   --   --   --   CL 105 105 104  --   --   --   --   CO2 '27 27 25  '$ --   --   --   --   GLUCOSE 89 91 93  --   --   --   --   BUN 6 <5* 6  --   --   --   --   CREATININE 0.85 0.74 0.58  --   --   --   --   CALCIUM 8.5* 8.1* 8.5*  --   --   --   --   MG  --  1.6*  --  1.7 1.7 1.2* 1.6*  PHOS  --   --   --  3.2 2.9 1.8* 2.6   GFR: Estimated Creatinine Clearance: 94.2 mL/min (by C-G formula based on SCr of 0.58 mg/dL). Liver Function Tests: Recent Labs  Lab 06/29/21 0435 07/02/21 0553  AST 33 23  ALT 21 20  ALKPHOS 83 69  BILITOT 0.7 1.0  PROT 5.6* 5.3*  ALBUMIN 3.1* 2.9*   No results for input(s): LIPASE, AMYLASE in the last 168 hours. No results for input(s): AMMONIA in the last 168 hours. Coagulation Profile: No results for input(s): INR, PROTIME in the last 168 hours. Cardiac Enzymes: No results for input(s): CKTOTAL, CKMB, CKMBINDEX, TROPONINI in the last 168 hours. BNP (last 3 results) No results for input(s): PROBNP  in the last 8760 hours. HbA1C: No results for input(s): HGBA1C in the last 72 hours. CBG: Recent Labs  Lab 07/03/21 1632 07/03/21 2033 07/04/21 0016 07/04/21 0402 07/04/21 0816  GLUCAP 101* 102* 101* 96 106*   Lipid Profile: No results for input(s): CHOL, HDL, LDLCALC, TRIG, CHOLHDL, LDLDIRECT in the last 72 hours. Thyroid Function Tests: No results for input(s): TSH, T4TOTAL, FREET4, T3FREE, THYROIDAB in the last 72 hours. Anemia Panel: No results for input(s): VITAMINB12, FOLATE, FERRITIN, TIBC, IRON, RETICCTPCT in the last 72 hours. Sepsis Labs: No results for input(s): PROCALCITON, LATICACIDVEN in the last 168 hours.  No results found for this or any previous visit (from the past 240 hour(s)).    Imaging Studies   No results found.   Medications   Scheduled Meds:  amLODipine  10 mg Oral Daily   bisacodyl  10 mg Oral Daily   Chlorhexidine Gluconate Cloth  6 each Topical Daily   feeding supplement  1 Container Oral TID BM   feeding supplement (OSMOLITE 1.5 CAL)  1,000 mL Per Tube Q24H   heparin lock flush  500 Units Intracatheter Q30 days   magnesium oxide  800 mg Oral Daily   morphine  60 mg Oral Q12H   neomycin-bacitracin-polymyxin  1 application Topical Daily   pantoprazole  40 mg Oral BID   potassium chloride  20 mEq Oral Daily   scopolamine  1 patch Transdermal Q72H   sodium chloride flush  10-40 mL Intracatheter Q12H   sucralfate  1 g Oral Q6H   zolpidem  10 mg Oral QHS   Continuous Infusions:  0.9 % NaCl with KCl 40 mEq /  L 100 mL/hr at 07/03/21 1743   promethazine (PHENERGAN) injection (IM or IVPB) 200 mL/hr at 06/26/21 1616       LOS: 17 days    Time spent: 30 minutes    Ezekiel Slocumb, DO [facility]  07/04/2021, 3:13 PM      If 7PM-7AM, please contact night-coverage. How to contact the [facility] Attending or Consulting provider [place] or covering provider during after hours [place], for this patient?    Check the care team in  [facility] and look for a) attending/consulting [facility] provider listed and b) the [facility] team listed Log into [email or web address] and use [place]'s universal password to access. If you do not have the password, please contact the hospital operator. Locate the [facility] provider you are looking for under [facility] and page to a number that you can be directly reached. If you still have difficulty reaching the provider, please page the [facility] (Director on Call) for the Hospitalists listed on amion for assistance.

## 2021-07-05 ENCOUNTER — Inpatient Hospital Stay (HOSPITAL_COMMUNITY): Payer: Medicaid Other

## 2021-07-05 LAB — BASIC METABOLIC PANEL
Anion gap: 4 — ABNORMAL LOW (ref 5–15)
BUN: 11 mg/dL (ref 6–20)
CO2: 27 mmol/L (ref 22–32)
Calcium: 8.9 mg/dL (ref 8.9–10.3)
Chloride: 105 mmol/L (ref 98–111)
Creatinine, Ser: 0.66 mg/dL (ref 0.44–1.00)
GFR, Estimated: >60 mL/min (ref >60)
Glucose, Bld: 102 mg/dL — ABNORMAL HIGH (ref 70–99)
Potassium: 5.3 mmol/L — ABNORMAL HIGH (ref 3.5–5.1)
Sodium: 136 mmol/L (ref 135–145)

## 2021-07-05 LAB — AMMONIA: Ammonia: 19 umol/L (ref 9–35)

## 2021-07-05 LAB — HEPATIC FUNCTION PANEL
ALT: 14 U/L (ref 0–44)
AST: 22 U/L (ref 15–41)
Albumin: 2.9 g/dL — ABNORMAL LOW (ref 3.5–5.0)
Alkaline Phosphatase: 74 U/L (ref 38–126)
Bilirubin, Direct: 0.2 mg/dL (ref 0.0–0.2)
Indirect Bilirubin: 0.4 mg/dL (ref 0.3–0.9)
Total Bilirubin: 0.6 mg/dL (ref 0.3–1.2)
Total Protein: 5.4 g/dL — ABNORMAL LOW (ref 6.5–8.1)

## 2021-07-05 LAB — GLUCOSE, CAPILLARY
Glucose-Capillary: 102 mg/dL — ABNORMAL HIGH (ref 70–99)
Glucose-Capillary: 100 mg/dL — ABNORMAL HIGH (ref 70–99)
Glucose-Capillary: 103 mg/dL — ABNORMAL HIGH (ref 70–99)

## 2021-07-05 LAB — MAGNESIUM: Magnesium: 1.9 mg/dL (ref 1.7–2.4)

## 2021-07-05 LAB — PHOSPHORUS: Phosphorus: 3.1 mg/dL (ref 2.5–4.6)

## 2021-07-05 LAB — POTASSIUM: Potassium: 4.8 mmol/L (ref 3.5–5.1)

## 2021-07-05 MED ORDER — ACETAMINOPHEN 80 MG PO CHEW
500.0000 mg | CHEWABLE_TABLET | Freq: Once | ORAL | Status: DC
  Filled 2021-07-05: qty 7

## 2021-07-05 MED ORDER — GADOBUTROL 1 MMOL/ML IV SOLN
10.0000 mL | Freq: Once | INTRAVENOUS | Status: AC | PRN
  Administered 2021-07-05: 10 mL via INTRAVENOUS

## 2021-07-05 MED ORDER — KETOROLAC TROMETHAMINE 30 MG/ML IJ SOLN: 30.0000 mg | Freq: Once | INTRAMUSCULAR | Status: DC

## 2021-07-05 MED ORDER — ACETAMINOPHEN 160 MG/5ML PO SOLN
650.0000 mg | Freq: Once | ORAL | Status: AC
  Administered 2021-07-05: 650 mg
  Filled 2021-07-05: qty 20.3

## 2021-07-05 NOTE — Progress Notes (Addendum)
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  Status post GJ tube placed.  Osmolite tube feeds started.  Nausea seems controlled with scopolamine patch.   Slowly adjusting TF's  Monitor for refeeding syndrome.  Myoclonic Jerks Acute metabolic encephalopathy These noted 7/24.   --follow up hepatic function panel, ammonia, repeat K level --further eval depending on above   Hyperkalemia, mild - K 5.3 this AM.  Stop NS-Kcl fluids. 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  Status post palliative radiation.  Metastatic tumor in the right ventricle although echo unremarkable.  Pain better controlled   Iron deficiency anemia due to chronic blood loss: Status post IV iron infusion, Feraheme    Hypokalemia/hypomagnesemia: Secondary to nausea and vomiting.  Replacing.  Monitor labs, replace as needed.   AKI: Secondary to nausea and vomiting and poor p.o. intake.  Resolved with IV fluids.  COPD: Stable, no exacerbation s/sx's.  Monitor.   Malnutrition of moderate degree: Nutrition Status: Nutrition Problem: Moderate Malnutrition Etiology: chronic illness, cancer and cancer related  treatments Signs/Symptoms: percent weight loss, energy intake < or equal to 75% for > or equal to 1 month, mild muscle depletion: Seen by nutrition.  Osmolite tube feeds initiated   Hypothyroidism: continue Synthroid  Hx of migraine: stable, monitor.   E. coli UTI: Completed 3 day course antibiotics.   Essential hypertension: Continue home medications.  Tobacco abuse: Nicotine patch.  Morbid Obesity: Body mass index is 38.4 kg/m.  With BMI>35 plus HTN and HLD.  DVT prophylaxis: Place and maintain sequential compression device Start: 06/18/21 1807 SCDs Start: 06/17/21 1554   Diet:  Diet Orders (From admission, onward)     Start     Ordered   07/03/21 [id number]  Diet clear liquid Room service appropriate? Yes; Fluid consistency: Thin  Diet effective now       Question Answer Comment  Room service appropriate? Yes   Fluid consistency: Thin      07/03/21 [id number]              Code Status: Full Code   Brief Narrative / Hospital Course to Date:   55 year old female past medical history of pancreatic cancer, hypertension, morbid obesity and COPD admitted on 7/6 for intractable nausea/vomiting and found to be in acute kidney injury.  Despite hydration, attempts to advance diet led to recurrence of nausea and vomiting.  GJ tube placed by interventional radiology on 7/20.  Patient also found to have a mild UTI and completed 3 days of antibiotics.  Patient doing better with combination of scheduled extended release pain medication and as needed for breakthrough.  Tube feeds initiated after tube placement and  patient tolerating well. Nausea controlled with scopolamine patch in place.  Subjective 07/05/21    Pt was sleeping but woke to voice.  She reports tolerating tube feeds at goal rate.  They are currently not running.  She has intermittent jerking movements today that are new.  She has difficulty staying awake and seems confused.   Disposition Plan & Communication   Status is:  Inpatient  Remains inpatient appropriate because:Inpatient level of care appropriate due to severity of illness.  Electrolyte abnormalities, adjusting tube feeds  Dispo: The patient is from: Home              Anticipated d/c is to: Home              Patient currently is not medically stable to d/c.   Difficult to place patient No    Consults, Procedures, Significant Events   Consultants:  Oncology Interventional radiology Gastroenterology  Procedures:  GJ tube placement 7/20 EGD done 7/8  Antimicrobials:  Anti-infectives (From admission, onward)    Start     Dose/Rate Route Frequency Ordered Stop   07/01/21 1427  vancomycin (VANCOCIN) IVPB 1000 mg/200 mL premix        over 60 Minutes Intravenous Continuous PRN 07/01/21 1428 07/01/21 1427   06/29/21 1500  vancomycin (VANCOREADY) IVPB 1500 mg/300 mL        1,500 mg 150 mL/hr over 120 Minutes Intravenous To Radiology 06/26/21 1705 06/29/21 1800   06/26/21 1800  vancomycin (VANCOREADY) IVPB 1500 mg/300 mL  Status:  Discontinued        1,500 mg 150 mL/hr over 120 Minutes Intravenous To Radiology 06/26/21 1704 06/26/21 1705   06/25/21 1200  erythromycin 500 mg in sodium chloride 0.9 % 100 mL IVPB  Status:  Discontinued        500 mg 100 mL/hr over 60 Minutes Intravenous Every 8 hours 06/25/21 0926 07/01/21 0826   06/21/21 1000  cefTRIAXone (ROCEPHIN) 1 g in sodium chloride 0.9 % 100 mL IVPB        1 g 200 mL/hr over 30 Minutes Intravenous Every 24 hours 06/20/21 1634 06/21/21 1218   06/19/21 1300  levofloxacin (LEVAQUIN) IVPB 500 mg  Status:  Discontinued        500 mg 100 mL/hr over 60 Minutes Intravenous Daily 06/19/21 1212 06/20/21 1634   06/17/21 1800  fluconazole (DIFLUCAN) IVPB 200 mg  Status:  Discontinued        200 mg 100 mL/hr over 60 Minutes Intravenous Daily-1800 06/17/21 1558 06/23/21 1640         Micro    Objective   Vitals:   07/04/21 1401 07/04/21 2033 07/05/21 0453 07/05/21 1406  BP: 127/81 114/68  124/85 103/79  Pulse: 67 75 84 75  Resp: '18 18 16 15  '$ Temp: 97.9 F (36.6 C) 98.6 F (37 C) 98 F (36.7 C)   TempSrc: Oral Oral Oral   SpO2: 100% 100% 100%   Weight:      Height:        Intake/Output Summary (Last 24 hours) at 07/05/2021 1558 Last data filed at 07/05/2021 1441 Gross per 24 hour  Intake 250 ml  Output 2 ml  Net 248 ml   Filed Weights   06/25/21 1100 07/03/21 0403 07/04/21 0500  Weight: 102.1 kg 96.4 kg 101.5 kg    Physical Exam:  General exam: sleeping woke easily but very drowsy, no acute distress, mildly confused tod ay Respiratory system: CTAB diminished bases, normal respiratory effort. Cardiovascular  system: normal S1/S2, RRR   Gastrointestinal system: soft, non-distended, epigastric tenderness Central nervous system: Drowsy.  O x3. no gross focal neurologic deficits Extremities: intermittent myoclonic jerks, trace BLE edema Psychiatry: normal mood, congruent affect  Labs   Data Reviewed: I have personally reviewed following labs and imaging studies  CBC: Recent Labs  Lab 07/02/21 0553  WBC 9.2  HGB 9.5*  HCT 29.0*  MCV 91.2  PLT 123XX123   Basic Metabolic Panel: Recent Labs  Lab 06/29/21 0435 07/02/21 0553 07/02/21 1250 07/02/21 1730 07/03/21 0421 07/03/21 1737 07/05/21 0517  NA 139 139  --   --   --   --  136  K 2.9* 3.8  --   --   --   --  5.3*  CL 105 104  --   --   --   --  105  CO2 27 25  --   --   --   --  27  GLUCOSE 91 93  --   --   --   --  102*  BUN <5* 6  --   --   --   --  11  CREATININE 0.74 0.58  --   --   --   --  0.66  CALCIUM 8.1* 8.5*  --   --   --   --  8.9  MG 1.6*  --  1.7 1.7 1.2* 1.6* 1.9  PHOS  --   --  3.2 2.9 1.8* 2.6 3.1   GFR: Estimated Creatinine Clearance: 94.2 mL/min (by C-G formula based on SCr of 0.66 mg/dL). Liver Function Tests: Recent Labs  Lab 06/29/21 0435 07/02/21 0553  AST 33 23  ALT 21 20  ALKPHOS 83 69  BILITOT 0.7 1.0  PROT 5.6* 5.3*  ALBUMIN 3.1* 2.9*   No results for  input(s): LIPASE, AMYLASE in the last 168 hours. No results for input(s): AMMONIA in the last 168 hours. Coagulation Profile: No results for input(s): INR, PROTIME in the last 168 hours. Cardiac Enzymes: No results for input(s): CKTOTAL, CKMB, CKMBINDEX, TROPONINI in the last 168 hours. BNP (last 3 results) No results for input(s): PROBNP in the last 8760 hours. HbA1C: No results for input(s): HGBA1C in the last 72 hours. CBG: Recent Labs  Lab 07/04/21 0816 07/04/21 2027 07/04/21 2341 07/05/21 0452 07/05/21 0735  GLUCAP 106* 90 101* 102* 100*   Lipid Profile: No results for input(s): CHOL, HDL, LDLCALC, TRIG, CHOLHDL, LDLDIRECT in the last 72 hours. Thyroid Function Tests: No results for input(s): TSH, T4TOTAL, FREET4, T3FREE, THYROIDAB in the last 72 hours. Anemia Panel: No results for input(s): VITAMINB12, FOLATE, FERRITIN, TIBC, IRON, RETICCTPCT in the last 72 hours. Sepsis Labs: No results for input(s): PROCALCITON, LATICACIDVEN in the last 168 hours.  No results found for this or any previous visit (from the past 240 hour(s)).    Imaging Studies   No results found.   Medications   Scheduled Meds:  amLODipine  10 mg Oral Daily   bisacodyl  10 mg Oral Daily   Chlorhexidine Gluconate Cloth  6 each Topical Daily   feeding supplement  1 Container Oral TID BM   feeding supplement (OSMOLITE 1.5 CAL)  1,000 mL Per Tube Q24H   heparin lock flush  500 Units Intracatheter Q30 days   magnesium oxide  800 mg Oral Daily   morphine  60 mg Oral Q12H   neomycin-bacitracin-polymyxin  1 application Topical Daily   pantoprazole  40 mg Oral BID  scopolamine  1 patch Transdermal Q72H   sodium chloride flush  10-40 mL Intracatheter Q12H   sucralfate  1 g Oral Q6H   zolpidem  10 mg Oral QHS   Continuous Infusions:  promethazine (PHENERGAN) injection (IM or IVPB) 200 mL/hr at 06/26/21 1616       LOS: 18 days    Time spent: 30 minutes    Ezekiel Slocumb, DO [facility]  07/05/2021, 3:58 PM      If 7PM-7AM, please contact night-coverage. How to contact the [facility] Attending or Consulting provider [place] or covering provider during after hours [place], for this patient?    Check the care team in [facility] and look for a) attending/consulting [facility] provider listed and b) the [facility] team listed Log into [email or web address] and use [facility]'s universal password to access. If you do not have the password, please contact the hospital operator. Locate the [facility] provider you are looking for under [facility] and page to a number that you can be directly reached. If you still have difficulty reaching the provider, please page the [facility] (Director on Call) for the Hospitalists listed on amion for assistance.

## 2021-07-06 DIAGNOSIS — Z720 Tobacco use: Secondary | ICD-10-CM

## 2021-07-06 DIAGNOSIS — G43709 Chronic migraine without aura, not intractable, without status migrainosus: Secondary | ICD-10-CM

## 2021-07-06 DIAGNOSIS — I10 Essential (primary) hypertension: Secondary | ICD-10-CM

## 2021-07-06 DIAGNOSIS — J431 Panlobular emphysema: Secondary | ICD-10-CM

## 2021-07-06 DIAGNOSIS — C250 Malignant neoplasm of head of pancreas: Secondary | ICD-10-CM | POA: Diagnosis not present

## 2021-07-06 DIAGNOSIS — E038 Other specified hypothyroidism: Secondary | ICD-10-CM

## 2021-07-06 LAB — BASIC METABOLIC PANEL
Anion gap: 5 (ref 5–15)
BUN: 11 mg/dL (ref 6–20)
CO2: 27 mmol/L (ref 22–32)
Calcium: 8.3 mg/dL — ABNORMAL LOW (ref 8.9–10.3)
Chloride: 104 mmol/L (ref 98–111)
Creatinine, Ser: 0.62 mg/dL (ref 0.44–1.00)
GFR, Estimated: >60 mL/min (ref >60)
Glucose, Bld: 101 mg/dL — ABNORMAL HIGH (ref 70–99)
Potassium: 4 mmol/L (ref 3.5–5.1)
Sodium: 136 mmol/L (ref 135–145)

## 2021-07-06 LAB — GLUCOSE, CAPILLARY
Glucose-Capillary: 105 mg/dL — ABNORMAL HIGH (ref 70–99)
Glucose-Capillary: 107 mg/dL — ABNORMAL HIGH (ref 70–99)
Glucose-Capillary: 88 mg/dL (ref 70–99)
Glucose-Capillary: 89 mg/dL (ref 70–99)
Glucose-Capillary: 91 mg/dL (ref 70–99)
Glucose-Capillary: 99 mg/dL (ref 70–99)

## 2021-07-06 LAB — MAGNESIUM: Magnesium: 1.7 mg/dL (ref 1.7–2.4)

## 2021-07-06 LAB — PHOSPHORUS: Phosphorus: 3.6 mg/dL (ref 2.5–4.6)

## 2021-07-06 MED ORDER — SCOPOLAMINE 1 MG/3DAYS TD PT72
1.0000 | MEDICATED_PATCH | TRANSDERMAL | Status: DC
  Administered 2021-07-06: 1.5 mg via TRANSDERMAL
  Filled 2021-07-06: qty 1

## 2021-07-06 MED ORDER — POLYETHYLENE GLYCOL 3350 17 G PO PACK: 17.0000 g | PACK | Freq: Every day | ORAL | Status: DC | PRN

## 2021-07-06 MED ORDER — OSMOLITE 1.5 CAL PO LIQD
1000.0000 mL | ORAL | Status: DC
  Administered 2021-07-06: 1000 mL
  Filled 2021-07-06 (×3): qty 1000

## 2021-07-06 MED ORDER — SENNOSIDES-DOCUSATE SODIUM 8.6-50 MG PO TABS
2.0000 | ORAL_TABLET | Freq: Two times a day (BID) | ORAL | Status: DC
  Administered 2021-07-06 – 2021-07-07 (×2): 2 via ORAL
  Filled 2021-07-06 (×2): qty 2

## 2021-07-06 NOTE — Progress Notes (Signed)
PROGRESS NOTE    Wendy Hoffman  [id number] DOB: 1966-11-20 DOA: 06/17/2021 PCP: Patient, No Pcp Per (Inactive)    Brief Narrative:  Wendy Hoffman is a 55 year old female with past medical history significant for pancreatic cancer, essential hypertension, COPD, morbid obesity who presented to [place] ED on 7/6 for intractable nausea/vomiting.  Unable to tolerate both solids and liquids.  Attempted to utilize antinausea medications without effect outpatient.    In the ED, patient was noted to have low potassium of 2.4, elevated creatinine of 3.25, WBC 6.2, CT abdomen/pelvis without contrast with no acute findings or evidence of bowel obstruction; notable multiple liver metastases similar to previous exam.  Patient was consulted for further evaluation management of hyperkalemia and acute renal failure in the setting of intractable nausea/vomiting related to metastatic pancreatic adenocarcinoma.   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  Etiology likely secondary to metastatic pancreatic adenocarcinoma.  CT abdomen/pelvis without contrast with no acute findings or bowel obstruction noted.  Patient underwent GJ tube placement by IR on 7/20 and initiated on tube feeds. --Dietitian following, appreciate assistance --Continue Osmolite 1.5 at 85 mL/h x 16 hours with free water flushes 240 mL 3 times daily  Metastatic pancreatic  adenocarcinoma Follows with medical oncology outpatient, Dr. Benay Spice.  Patient received palliative radiation to her duodenal mass, completed 03/06/2021.  Currently on chemotherapy.  Outpatient follow-up with medical oncology cancer Center scheduled for the week of 07/13/2021. --MS Contin 60 mg p.o. every 12 hours --Oxycodone 10-20 mg p.o. every 4 hours as needed  -- Dilaudid 1-2 mg IV every 4 hours as needed severe breakthrough pain  Acute renal failure, POA: Resolved Urology likely secondary to poor oral intake in the setting of intractable nausea/vomiting related to her pancreatic adenocarcinoma.  Creatinine 3.85 on admission.  Received IV fluid hydration and now on tube feeds. --Cr 3.85>>0.62  Iron deficiency anemia Hemoglobin 9.5 on 07/02/2021, stable.  Has received IV iron infusions in the past.  Outpatient follow-up with hematology/oncology.  Hyperkalemia: Resolved Potassium 5.3 on 07/05/2021, likely secondary to potassium containing fluids which was discontinued.  Repeat potassium this morning 4.0.  Hypokalemia: Resolved Hypomagnesemia: Resolved Present on admission, likely secondary to nausea/vomiting.  Repleted initially.  Moderate protein calorie malnutrition Nutrition Status: Nutrition Problem: Moderate Malnutrition Etiology: chronic illness, cancer and cancer related treatments Signs/Symptoms: percent weight loss, energy intake < or equal to 75% for > or equal to 1 month, mild muscle depletion Interventions: Tube feeding --Dietitian following, appreciate assistance --Continue tube feeds, now at goal  E. coli UTI Completed 3-day course of antibiotics  Essential hypertension BP 104/68 this morning, well controlled. --Amlodipine 10 mg p.o. daily --Hydralazine 10 mg IV as needed  Asthma --Dulera 200-54mg 2 puff BID  Constipation --Senokot-as 2 tablets twice daily --Bisacodyl 10 mg p.o. daily --MiraLAX p.o. daily as needed  Tobacco use disorder Counseled on need for  cessation. --Nicotine patch  Morbid obesity  Body mass index is 38.4 kg/m.  Discussed with patient needs for aggressive lifestyle changes/weight loss as this complicates all facets of care.  Outpatient follow-up with PCP.   Hyperlipidemia Aortic atherosclerosis --On Crestor 5 mg p.o. daily at home   DVT prophylaxis: Place and maintain sequential compression device Start: 06/18/21 1807 SCDs Start: 06/17/21 1554   Code Status: Full Code Family Communication: No family present at bedside this morning  Disposition Plan:  Level of care: Med-Surg Status is: Inpatient  Remains inpatient appropriate because:IV treatments appropriate due to intensity of illness or inability to take PO and Inpatient level of care appropriate due to severity of illness  Dispo: The patient is from: Home              Anticipated d/c is to: Home              Patient currently is not medically stable to d/c.   Difficult to place patient No  Consultants:  Interventional radiology Medical oncology, Dr. Malachy Mood  Procedures:  GJ tube placement by IR 7/20  Antimicrobials:  Ceftriaxone 7/10 - 7/10 Vancomycin 7/15, 7/18, 7/20 Fluconazole 7/6 - 7/12 Erythromycin 7/14 - 7/20 Levaquin 7/8 - 7/9    Subjective: Patient seen examined bedside, resting comfortably.  Sitting at edge of bed.  Tolerating tube feeds.  Continues with mild abdominal pain, otherwise no other specific complaints or concerns this morning.  Denies headache, no further nausea/vomiting, no diarrhea, no chest pain, no palpitations, no shortness of breath, no weakness, no fatigue, no paresthesias.  No acute events overnight per nursing staff.  Objective: Vitals:   07/05/21 1924 07/05/21 2115 07/06/21 0415 07/06/21 1347  BP:  117/73 104/68 121/74  Pulse:  84 87 89  Resp:  '16 16 16  '$ Temp:  98.9 F (37.2 C) 98.3 F (36.8 C) 98.2 F (36.8 C)  TempSrc:  Oral Oral Oral  SpO2: 97% 100% 99% 100%  Weight:      Height:        Intake/Output  Summary (Last 24 hours) at 07/06/2021 1540 Last data filed at 07/06/2021 1348 Gross per 24 hour  Intake 370 ml  Output 26 ml  Net 344 ml   Filed Weights   06/25/21 1100 07/03/21 0403 07/04/21 0500  Weight: 102.1 kg 96.4 kg 101.5 kg    Examination:  General exam: Appears calm and comfortable  Respiratory system: Clear to auscultation. Respiratory effort normal.  On room air Cardiovascular system: S1 & S2 heard, RRR. No JVD, murmurs, rubs, gallops or clicks. No pedal edema. Gastrointestinal system: Abdomen is nondistended, soft and nontender. No organomegaly or masses felt. Normal bowel sounds heard.  GJ tube noted Central nervous system: Alert and oriented. No focal neurological deficits. Extremities: Symmetric 5 x 5 power. Skin: No rashes, lesions or ulcers Psychiatry: Judgement and insight appear normal. Mood & affect appropriate.     Data Reviewed: I have personally reviewed following labs and imaging studies  CBC: Recent Labs  Lab 07/02/21 0553  WBC 9.2  HGB 9.5*  HCT 29.0*  MCV 91.2  PLT 123XX123   Basic Metabolic Panel: Recent Labs  Lab 07/02/21 0553 07/02/21 1250 07/02/21 1730 07/03/21 0421 07/03/21 1737 07/05/21 0517 07/05/21 1555 07/06/21 0518  NA 139  --   --   --   --  136  --  136  K 3.8  --   --   --   --  5.3* 4.8 4.0  CL 104  --   --   --   --  105  --  104  CO2 25  --   --   --   --  27  --  27  GLUCOSE 93  --   --   --   --  102*  --  101*  BUN 6  --   --   --   --  11  --  11  CREATININE 0.58  --   --   --   --  0.66  --  0.62  CALCIUM 8.5*  --   --   --   --  8.9  --  8.3*  MG  --    < > 1.7 1.2* 1.6* 1.9  --  1.7  PHOS  --    < > 2.9 1.8* 2.6 3.1  --  3.6   < > = values in this interval not displayed.   GFR: Estimated Creatinine Clearance: 94.2 mL/min (by C-G formula based on SCr of 0.62 mg/dL). Liver Function Tests: Recent Labs  Lab 07/02/21 0553 07/05/21 1555  AST 23 22  ALT 20 14  ALKPHOS 69 74  BILITOT 1.0 0.6  PROT 5.3* 5.4*   ALBUMIN 2.9* 2.9*   No results for input(s): LIPASE, AMYLASE in the last 168 hours. Recent Labs  Lab 07/05/21 1555  AMMONIA 19   Coagulation Profile: No results for input(s): INR, PROTIME in the last 168 hours. Cardiac Enzymes: No results for input(s): CKTOTAL, CKMB, CKMBINDEX, TROPONINI in the last 168 hours. BNP (last 3 results) No results for input(s): PROBNP in the last 8760 hours. HbA1C: No results for input(s): HGBA1C in the last 72 hours. CBG: Recent Labs  Lab 07/05/21 2109 07/06/21 0004 07/06/21 0413 07/06/21 0801 07/06/21 1206  GLUCAP 103* 91 88 107* 89   Lipid Profile: No results for input(s): CHOL, HDL, LDLCALC, TRIG, CHOLHDL, LDLDIRECT in the last 72 hours. Thyroid Function Tests: No results for input(s): TSH, T4TOTAL, FREET4, T3FREE, THYROIDAB in the last 72 hours. Anemia Panel: No results for input(s): VITAMINB12, FOLATE, FERRITIN, TIBC, IRON, RETICCTPCT in the last 72 hours. Sepsis Labs: No results for input(s): PROCALCITON, LATICACIDVEN in the last 168 hours.  No results found for this or any previous visit (from the past 240 hour(s)).       Radiology Studies: MR BRAIN W WO CONTRAST  Result Date: 07/05/2021 CLINICAL DATA:  Staging of gastrointestinal small cancer. History of pancreatic carcinoma. EXAM: MRI HEAD WITHOUT AND WITH CONTRAST TECHNIQUE: Multiplanar, multiecho pulse sequences of the brain and surrounding structures were obtained without and with intravenous contrast. CONTRAST:  9m GADAVIST GADOBUTROL 1 MMOL/ML IV SOLN COMPARISON:  None. FINDINGS: Brain: No acute infarct, mass effect or extra-axial collection. No acute or chronic hemorrhage. There is multifocal hyperintense T2-weighted signal within the white matter. Parenchymal volume and CSF spaces are normal. A partially empty sella is incidentally noted. There is no abnormal contrast enhancement. Vascular: Major flow voids are preserved. Skull and upper cervical spine: Normal calvarium and  skull base. Visualized upper cervical spine and soft tissues are normal. Sinuses/Orbits:No paranasal sinus fluid levels or advanced mucosal thickening. No mastoid or middle ear effusion. Normal orbits. IMPRESSION: 1. No intracranial metastatic disease. 2. Multifocal hyperintense T2-weighted signal within the white matter, likely due to arteriosclerosis. Electronically Signed   By: KUlyses JarredM.D.   On: 07/05/2021 19:37        Scheduled Meds:  amLODipine  10 mg Oral Daily   bisacodyl  10 mg Oral Daily   Chlorhexidine Gluconate Cloth  6 each Topical Daily   feeding supplement  1 Container Oral TID BM   feeding supplement (OSMOLITE 1.5 CAL)  1,000 mL Per Tube Q24H   heparin lock flush  500 Units Intracatheter Q30 days   magnesium oxide  800 mg Oral Daily   morphine  60 mg Oral Q12H   neomycin-bacitracin-polymyxin  1 application Topical Daily   pantoprazole  40 mg Oral BID   scopolamine  1 patch Transdermal Q72H   senna-docusate  2 tablet Oral BID   sodium chloride flush  10-40 mL Intracatheter Q12H   sucralfate  1 g Oral Q6H   zolpidem  10 mg Oral QHS   Continuous Infusions:  promethazine (PHENERGAN) injection (IM or IVPB) 200 mL/hr at 06/26/21 1616     LOS: 19 days    Time spent: 39 minutes spent on chart review, discussion with nursing staff, consultants, updating family and interview/physical exam; more than 50% of that time was spent in counseling and/or coordination of care.    Bobetta Korf J [place] DO [facility] Available via Epic secure chat 7am-7pm After these hours, please refer to coverage provider listed on [email or web address] 07/06/2021, 3:40 PM

## 2021-07-06 NOTE — Progress Notes (Addendum)
HEMATOLOGY-ONCOLOGY PROGRESS NOTE  SUBJECTIVE: Continues to have intermittent abdominal pain.  Reports nausea but no vomiting.  Tolerating tube feed well.  Having intermittent myoclonic jerking secondary to morphine.  Oncology History  Malignant neoplasm of head of pancreas (HCC)  02/17/2021 Initial Diagnosis   Malignant neoplasm of head of pancreas (HCC)    02/25/2021 Cancer Staging   Staging form: Exocrine Pancreas, AJCC 8th Edition - Clinical: Stage IV (cT3, cN0, cM1) - Signed by ,  B, MD on 02/25/2021  Total positive nodes: 0    03/17/2021 -  Chemotherapy    Patient is on Treatment Plan: PANCREAS MODIFIED FOLFIRINOX Q14D X 4 CYCLES       03/23/2021 Genetic Testing   Negative hereditary cancer genetic testing: no pathogenic variants detected in [facility] Multi-Cancer Panel + Pancreatitis Genes.  The report date is March 23, 2021.   The Multi-Cancer Panel with pancreatitis genes and preliminary pancreatic cancer genes offered by [facility] includes sequencing and/or deletion duplication testing of the following 91 genes: AIP, ALK, APC, ATM, AXIN2,BAP1,  BARD1, BLM, BMPR1A, BRCA1, BRCA2, BRIP1, CASR, CDC73, CDH1, CDK4, CDKN1B, CDKN1C, CDKN2A (p14ARF), CDKN2A (p16INK4a), CEBPA, CFTR, CHEK2, CPA1, CTNNA1, CTRC, DICER1, DIS3L2, EGFR (c.2369C>T, p.Thr790Met variant only), EPCAM (Deletion/duplication testing only), FANCC, FH, FLCN, GATA2, GPC3, GREM1 (Promoter region deletion/duplication testing only), HOXB13 (c.251G>A, p.Gly84Glu), HRAS, KIT, MAX, MEN1, MET, MITF (c.952G>A, p.Glu318Lys variant only), MLH1, MSH2, MSH3, MSH6, MUTYH, NBN, NF1, NF2, NTHL1, PALB2, PALLD, PDGFRA, PHOX2B, PMS2, POLD1, POLE, POT1, PRKAR1A, PRSS1, PTCH1, PTEN, RAD50, RAD51C, RAD51D, RB1, RECQL4, RET, RNF43, RUNX1, SDHAF2, SDHA (sequence changes only), SDHB, SDHC, SDHD, SMAD4, SMARCA4, SMARCB1, SMARCE1, SPINK1, STK11, SUFU, TERC, TERT, TMEM127, TP53, TSC1, TSC2, VHL, WRN and WT1.     PHYSICAL EXAMINATION:  Vitals:    07/05/21 2115 07/06/21 0415  BP: 117/73 104/68  Pulse: 84 87  Resp: 16 16  Temp: 98.9 F (37.2 C) 98.3 F (36.8 C)  SpO2: 100% 99%   Intake/Output from previous day: 07/24 0701 - 07/25 0700 In: 130 [P.O.:120; I.V.:10] Out: 28 [Urine:3; Drains:25]  OROPHARYNX: No thrush ABDOMEN: fullness and tenderness in the mid and rt. Upper abdomen.  GJ tube dressing without drainage.  Site without erythema.  Vascular: No leg edema LABORATORY DATA:  I have reviewed the data as listed CMP Latest Ref Rng & Units 07/06/2021 07/05/2021 07/05/2021  Glucose 70 - 99 mg/dL 101(H) - 102(H)  BUN 6 - 20 mg/dL 11 - 11  Creatinine 0.44 - 1.00 mg/dL 0.62 - 0.66  Sodium 135 - 145 mmol/L 136 - 136  Potassium 3.5 - 5.1 mmol/L 4.0 4.8 5.3(H)  Chloride 98 - 111 mmol/L 104 - 105  CO2 22 - 32 mmol/L 27 - 27  Calcium 8.9 - 10.3 mg/dL 8.3(L) - 8.9  Total Protein 6.5 - 8.1 g/dL - 5.4(L) -  Total Bilirubin 0.3 - 1.2 mg/dL - 0.6 -  Alkaline Phos 38 - 126 U/L - 74 -  AST 15 - 41 U/L - 22 -  ALT 0 - 44 U/L - 14 -    Lab Results  Component Value Date   WBC 9.2 07/02/2021   HGB 9.5 (L) 07/02/2021   HCT 29.0 (L) 07/02/2021   MCV 91.2 07/02/2021   PLT 170 07/02/2021   NEUTROABS 3.8 06/22/2021    CT ABDOMEN PELVIS WO CONTRAST  Result Date: 06/17/2021 CLINICAL DATA:  Rule out bowel obstruction. History of pancreas cancer. EXAM: CT ABDOMEN AND PELVIS WITHOUT CONTRAST TECHNIQUE: Multidetector CT imaging of the abdomen and pelvis   was performed following the standard protocol without IV contrast. COMPARISON:  05/23/2021 FINDINGS: Lower chest: No acute abnormality. Hepatobiliary: Within a background of diffuse hepatic steatosis there are multiple liver metastases as noted previously: The dominant lesion within the lateral segment of left hepatic lobe measures 7.8 cm, image 26/2. Previously this measured the same. The dominant lesion within the right hepatic lobe measures 8.2 cm, image 25/2. Previously 8 cm. Status post  cholecystectomy. No biliary dilatation. Pancreas: Head of pancreas mass is suboptimally evaluated due to lack of IV contrast material. Subjectively, this does not appear significantly changed in size when compared with the previous contrast enhanced MRI. Spleen: Normal in size without focal abnormality. Adrenals/Urinary Tract: Normal adrenal glands. No hydronephrosis identified bilaterally. Punctate stone noted within inferior pole of left kidney. Exophytic cyst is again noted arising off the posterior cortex of the interpolar right kidney measuring 1.3 cm. Urinary bladder appears collapsed. Stomach/Bowel: Stomach is nondistended. The appendix is visualized and appears normal. No pathologic dilatation of the large or small bowel loops. No significant bowel wall thickening or surrounding inflammatory fat stranding. Vascular/Lymphatic: Mild aortic atherosclerosis. No aneurysm. No abdominopelvic adenopathy identified. Reproductive: Status post hysterectomy. No adnexal masses.  The Other: No ascites or focal fluid collections. No peritoneal nodularity identified at this time. Musculoskeletal: No acute or significant osseous findings. IMPRESSION: 1. No acute findings within the abdomen or pelvis. No evidence for bowel obstruction. 2. Multiple liver metastases.  Similar to previous exam. 3. Head of pancreas mass is suboptimally evaluated due to lack of IV contrast material. Subjectively, this does not appear significantly changed in size when compared with the previous contrast enhanced MRI. 4. Aortic atherosclerosis. Aortic Atherosclerosis (ICD10-I70.0). Electronically Signed   By: Taylor  Stroud M.D.   On: 06/17/2021 19:07   DG THORACOLUMABAR SPINE  Result Date: 06/28/2021 CLINICAL DATA:  Low back pain for 1 day, history of metastatic pancreatic cancer EXAM: THORACOLUMBAR SPINE 1V COMPARISON:  06/26/2021 FINDINGS: Frontal and lateral views of the thoracolumbar spine are obtained. There are no acute or destructive  bony lesions. Mild spondylosis is seen within the mid to lower thoracic spine. Severe facet hypertrophic changes are seen within the mid to lower lumbar spine. Sacroiliac joints are normal. Visualized bowel gas pattern is unremarkable. IMPRESSION: 1. Mild mid to lower thoracic spondylosis, with severe lower lumbar facet hypertrophy. 2. No acute or destructive bony lesions. Electronically Signed   By: Michael  Brown M.D.   On: 06/28/2021 15:22   MR BRAIN W WO CONTRAST  Result Date: 07/05/2021 CLINICAL DATA:  Staging of gastrointestinal small cancer. History of pancreatic carcinoma. EXAM: MRI HEAD WITHOUT AND WITH CONTRAST TECHNIQUE: Multiplanar, multiecho pulse sequences of the brain and surrounding structures were obtained without and with intravenous contrast. CONTRAST:  10mL GADAVIST GADOBUTROL 1 MMOL/ML IV SOLN COMPARISON:  None. FINDINGS: Brain: No acute infarct, mass effect or extra-axial collection. No acute or chronic hemorrhage. There is multifocal hyperintense T2-weighted signal within the white matter. Parenchymal volume and CSF spaces are normal. A partially empty sella is incidentally noted. There is no abnormal contrast enhancement. Vascular: Major flow voids are preserved. Skull and upper cervical spine: Normal calvarium and skull base. Visualized upper cervical spine and soft tissues are normal. Sinuses/Orbits:No paranasal sinus fluid levels or advanced mucosal thickening. No mastoid or middle ear effusion. Normal orbits. IMPRESSION: 1. No intracranial metastatic disease. 2. Multifocal hyperintense T2-weighted signal within the white matter, likely due to arteriosclerosis. Electronically Signed   By: Kevin  Herman M.D.     On: 07/05/2021 19:37   CT ABDOMEN PELVIS W CONTRAST  Result Date: 06/26/2021 CLINICAL DATA:  Worsening nausea and vomiting over the last week, pancreatic cancer undergoing chemotherapy EXAM: CT ABDOMEN AND PELVIS WITH CONTRAST TECHNIQUE: Multidetector CT imaging of the abdomen  and pelvis was performed using the standard protocol following bolus administration of intravenous contrast. CONTRAST:  80mL OMNIPAQUE IOHEXOL 350 MG/ML SOLN COMPARISON:  06/17/2021, 05/23/2021, 02/16/2021 FINDINGS: Lower chest: No acute pleural or parenchymal lung disease. Central venous catheter tip within the right atrium near the junction with the IVC. No pericardial effusion. Hepatobiliary: Multiple hepatic masses are again identified. Largest lesion within the left lobe liver reference image 27/2 measures 8.0 x 6.1 cm, previously measuring 7.6 x 6.0 cm on MRI 05/23/2021. Largest lesion within the right lobe liver measures up to 8.2 x 4.5 cm on image 23/2, previously measuring 7.6 x 3.7 cm on MRI. Smaller lesions throughout the left lobe liver more inferiorly are grossly unchanged, though less well visualized on CT compared to MRI. No new lesions are definitively identified. No intrahepatic biliary duct dilation. Gallbladder is surgically absent. Pancreas: The ill-defined mass within the pancreatic head measures up to approximately 2.8 x 3.1 cm reference image 65/2, previously having measured 2.9 x 2.9 cm on MRI 05/23/2021. Mild pancreatic duct dilation and upstream pancreatic parenchymal atrophy again noted unchanged. Spleen: No focal splenic abnormalities. Mild splenomegaly unchanged. Adrenals/Urinary Tract: No urinary tract calculi or obstructive uropathy within either kidney. The kidneys enhance normally and symmetrically. Small left renal cortical cysts unchanged. The adrenals are unremarkable. Bladder is only minimally distended with no gross abnormalities. Stomach/Bowel: No bowel obstruction or ileus. Normal retrocecal appendix. No bowel wall thickening or inflammatory change. Vascular/Lymphatic: Mild atherosclerosis of the aorta and iliac vessels unchanged. No discrete adenopathy within the abdomen or pelvis. Reproductive: Status post hysterectomy. No adnexal masses. Other: Trace pelvic free fluid. No  free intraperitoneal gas. No abdominal wall hernia. Musculoskeletal: No acute or destructive bony lesions. Reconstructed images demonstrate no additional findings. IMPRESSION: 1. Grossly stable mass within the pancreatic head compatible with known pancreatic cancer. 2. No significant change in the size and number of known liver metastases, allowing for differences in technique compared to previous MRI. 3. Trace pelvic free fluid. 4.  Aortic Atherosclerosis (ICD10-I70.0). Electronically Signed   By: Michael  Brown M.D.   On: 06/26/2021 17:01   IR GASTROSTOMY TUBE MOD SED  Result Date: 07/01/2021 INDICATION: 55-year-old woman with history metastatic pancreatic cancer presents to interventional radiology for GJ tube placement for treatment of nausea and vomiting likely related to compression of the stomach from hepatic metastases. EXAM: Fluoroscopy guided GJ tube placement MEDICATIONS: Vancomycin 1 gm IV; Antibiotics were administered within 1 hour of the procedure. Glucagon 1 mg IV ANESTHESIA/SEDATION: Versed 3 mg IV; Fentanyl 150 mcg IV Moderate Sedation Time:  63 minutes The patient was continuously monitored during the procedure by the interventional radiology nurse under my direct supervision. CONTRAST:  10 mL of Omnipaque 300-administered into the gastric lumen. FLUOROSCOPY TIME:  Fluoroscopy Time: 8 minutes 12 seconds (229 mGy). COMPLICATIONS: None immediate. PROCEDURE: Informed written consent was obtained from the patient after a thorough discussion of the procedural risks, benefits and alternatives. All questions were addressed. Maximal Sterile Barrier Technique was utilized including caps, mask, sterile gowns, sterile gloves, sterile drape, hand hygiene and skin antiseptic. A timeout was performed prior to the initiation of the procedure. The inferior margin of the liver was marked utilizing ultrasound guidance. An orogastric tube was placed with   fluoroscopic guidance. The anterior abdomen was prepped  and draped in sterile fashion. Ultrasound evaluation of the left upper quadrant was performed to confirm the position of the liver. The skin and subcutaneous tissues were anesthetized with 1% lidocaine. Single gastropexy was placed utilizing fluoroscopic guidance. 19 gauge needle was directed into the distended stomach with fluoroscopic guidance. A wire was advanced into the stomach. 9-French vascular sheath was placed and the orogastric tube was snared using a Gooseneck snare device. Guidewire was advanced through the orogastric tube and snared. The snare and wire were pulled out of the patient's mouth. The snare device was connected to a 20-French gastrostomy tube. The snare device and gastrostomy tube were pulled through the patient's mouth and out the anterior abdominal wall. The gastrostomy tube was cut to an appropriate length. Contrast injection through gastrostomy tube confirmed placement within the stomach. Kumpe catheter and Glidewire advanced through the gastrostomy tube to the proximal jejunum. 9 French jejunostomy tube was inserted through the G-tube with tip positioned in the proximal jejunum. Position of the J limb was conformed by administering contrast under fluoroscopy. The gastrojejunostomy tube was flushed with normal saline. IMPRESSION: Gastrojejunostomy tube placed utilizing fluoroscopic guidance. The gastrostomy tube is 20 French while the jejunostomy tube is 9 French. Electronically Signed   By: Farhaan  Mir M.D.   On: 07/01/2021 16:41   VAS US LOWER EXTREMITY VENOUS (DVT)  Result Date: 06/29/2021  Lower Venous DVT Study Patient Name:  Alahni Winton  Date of Exam:   06/29/2021 Medical Rec #: [id number]        Accession #:    [id number] Date of Birth: 05/13/1966        Patient Gender: F Patient Age:   55Y Exam Location:  [facility] Procedure:      VAS US LOWER EXTREMITY VENOUS (DVT) Referring Phys: [id number] KRISTIN R CURCIO  --------------------------------------------------------------------------------  Indications: RLE swelling.  Risk Factors: Chemotherapy Cancer Pancreatic w/ mets. Limitations: Poor ultrasound/tissue interface. Comparison Study: No previous exams Performing Technologist: Jody Hill RVT, RDMS  Examination Guidelines: A complete evaluation includes B-mode imaging, spectral Doppler, color Doppler, and power Doppler as needed of all accessible portions of each vessel. Bilateral testing is considered an integral part of a complete examination. Limited examinations for reoccurring indications may be performed as noted. The reflux portion of the exam is performed with the patient in reverse Trendelenburg.  +---------+---------------+---------+-----------+----------+--------------+ RIGHT    CompressibilityPhasicitySpontaneityPropertiesThrombus Aging +---------+---------------+---------+-----------+----------+--------------+ CFV      Full           Yes      Yes                                 +---------+---------------+---------+-----------+----------+--------------+ SFJ      Full                                                        +---------+---------------+---------+-----------+----------+--------------+ FV Prox  Full           Yes      Yes                                 +---------+---------------+---------+-----------+----------+--------------+ FV Mid   Full             Yes      Yes                                 +---------+---------------+---------+-----------+----------+--------------+ FV DistalFull           Yes      Yes                                 +---------+---------------+---------+-----------+----------+--------------+ PFV      Full                                                        +---------+---------------+---------+-----------+----------+--------------+ POP      Full           Yes      Yes                                  +---------+---------------+---------+-----------+----------+--------------+ PTV      Full                                                        +---------+---------------+---------+-----------+----------+--------------+ PERO     Full                                                        +---------+---------------+---------+-----------+----------+--------------+   +---------+---------------+---------+-----------+----------+--------------+ LEFT     CompressibilityPhasicitySpontaneityPropertiesThrombus Aging +---------+---------------+---------+-----------+----------+--------------+ CFV      Full           Yes      Yes                                 +---------+---------------+---------+-----------+----------+--------------+ SFJ      Full                                                        +---------+---------------+---------+-----------+----------+--------------+ FV Prox  Full           Yes      Yes                                 +---------+---------------+---------+-----------+----------+--------------+ FV Mid   Full           Yes      Yes                                 +---------+---------------+---------+-----------+----------+--------------+ FV DistalFull           Yes      Yes                                 +---------+---------------+---------+-----------+----------+--------------+  PFV      Full                                                        +---------+---------------+---------+-----------+----------+--------------+ POP      Full           Yes      Yes                                 +---------+---------------+---------+-----------+----------+--------------+ PTV      Full                                                        +---------+---------------+---------+-----------+----------+--------------+ PERO     Full                                                         +---------+---------------+---------+-----------+----------+--------------+     Summary: BILATERAL: - No evidence of deep vein thrombosis seen in the lower extremities, bilaterally. - No evidence of superficial venous thrombosis in the lower extremities, bilaterally. -No evidence of popliteal cyst, bilaterally. RIGHT: - Ultrasound characteristics of enlarged lymph nodes are noted in the groin.  LEFT: - Ultrasound characteristics of enlarged lymph nodes noted in the groin.  *See table(s) above for measurements and observations. Electronically signed by Charles Fields MD on 06/29/2021 at 7:52:11 PM.    Final     ASSESSMENT AND PLAN: 1. Pancreas cancer -CT abdomen/pelvis without contrast 02/16/2021-multiple large hypoechoic masses in the patient's liver consistent metastatic disease, ill-defined masslike area in the pancreatic head measuring approximate 4.7 cm, possible filling defect in the right ventricle. -EGD 02/19/2021-large infiltrative mass with bleeding in the second portion of the duodenum, appears to be arising near the ampulla, partially obstructive with friable mucosa.  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 -Flexible sigmoidoscopy 02/19/2021-diverticulosis in the sigmoid colon, nonbleeding external and internal hemorrhoids -Cardiac CT 02/20/2021-mass in the mid RV attached to the interventricular septum measuring 16 mm x 6 mm and concerning for metastatic tumor -MRI abdomen with/without contrast 02/21/2021-mass in the posterior pancreatic head measuring 4.3 x 3.8 cm with obstruction of the central pancreatic duct and diffuse dilatation up to 6 mm consistent with pancreatic adenocarcinoma, liver lesions the largest mass of the central left lobe measuring 9.9 x 9.2 cm consistent with hepatic metastatic disease,  hepatomegaly. -Ultrasound-guided biopsy of a left liver lesion 02/24/2021-adenocarcinoma, morphologically similar to the duodenal biopsy; microsatellite stable, tumor  mutation burden 1 -Negative genetic testing -Palliative radiation to the duodenal mass 02/25/2021-03/06/2021 -Cycle 1 FOLFOX 03/17/2021 -Cycle 2 FOLFOX 04/01/2021 -Cycle 3 FOLFIRINOX 04/14/2021 -Cycle 4 FOLFIRINOX 04/27/2021, Udenyca added -Cycle 5 FOLFIRINOX 05/12/2021, Udenyca -MRI abdomen 05/23/2021-decrease in size of pancreas mass and hepatic lesions, no progressive disease -Cycle 6 FOLFIRINOX 05/27/2021 2.  Iron deficiency anemia-improved -Feraheme 510 mg 02/20/2021 3.  Protein calorie malnutrition 4.  Possible right ventricular filling defect on noncontrast CT scan MRI cardiac morphology 02/21/2021-mass in   the mid RV attached to the interventricular septum measures 16 mm x 6 mm.  Mass appears heterogeneous with area of enhancement on LGE imaging.  Mass is concerning for metastatic tumor.  Normal LV size and systolic function.  Normal RV size and systolic function. 5.  Hypertension 6.  History of CVA 7.  Hyperlipidemia 8.  Hypothyroidism 9.  Morbid obesity 10.  Asthma 11.  Migraines 12.  Pain secondary to #1 13.  Port-A-Cath placement 02/25/2021 14.  Hypokalemia 04/01/2021-started a potassium supplement 15.  Hospital admission 06/17/2021-intractable nausea and vomiting EGD 06/19/2021-diffuse gastritis, nonobstructing duodenal mass, biopsy of stomach-no malignancy or H. pylori, duodenal mass-adenocarcinoma  Wendy Hoffman appears stable.  She has intermittent abdominal pain which is overall well controlled with current medications.  She has mild nausea but no vomiting.  Tolerating J-tube feeding well.  G-tube remains to gravity. The dietitian is following and adjusting tube feeds.  Goal is to run Osmolite 1.5 at 85 cc/h x 16 hours.  The CA 19-9 is stable.  The plan is to resume systemic therapy as an outpatient.  We will most likely make a change to gemcitabine/Abraxane.  She has developed intermittent myoclonic jerking secondary to morphine.  Recommendations: 1.  Continue antacid regimen. 2.   Dietitian is following and adjusting tube feeds.  Goal is for Osmolite 1.5 at 80 cc/h x 16 hours. 3.  Continue G-tube to gravity. 4.  She has developed intermittent myoclonic jerking secondary to morphine.  Recommend that she continue MS Contin and oxycodone elixir for breakthrough pain.  We will arrange for outpatient follow-up at the cancer center next week.   LOS: 19 days   Wendy Hoffman 07/06/21 Wendy Hoffman was interviewed and examined.  She appears to be tolerating the J-tube feedings well.  She continues to have abdominal pain.  She reports adequate pain control with the current narcotic regimen.  I suspect the myoclonic jerks are related to the narcotics.  She was alert when I saw her this morning.  She would like to reschedule her follow-up at the Cancer center until the week of 07/13/2021.  She will be scheduled for an office visit and gemcitabine/Abraxane next week.  I will see her in the morning to discuss side effects of the gemcitabine/Abraxane regimen.  I was present for greater than 50% of today's visit.  I performed medical decision making.

## 2021-07-06 NOTE — Progress Notes (Signed)
Mobility Specialist - Progress Note     07/06/21 1524  Mobility  Activity Ambulated in hall;Transferred to/from [facility]  Level of Assistance Contact guard assist, steadying assist  Assistive Device Other (Comment) (IV Pole)  Distance Ambulated (ft) 180 ft  Mobility Ambulated with assistance in hallway;Ambulated with assistance in room  Mobility Response Tolerated fair  Mobility performed by Mobility specialist  $Mobility charge 1 Mobility    Pt was found already ambulating in hallway, however pt c/o of abd and chest pain when approached by mobility specialist. Pt required contact guard assist to return to room and sit EOB. Pt transferred from EOB to [facility] to use bathroom. Pt was returned to bed after session and left with call bell at side. RN informed of pt's pain and session.   [place] Specialist [facility] Services Phone: [phone] 07/06/21, 3:28 PM

## 2021-07-06 NOTE — Progress Notes (Addendum)
Nutrition Follow-up  DOCUMENTATION CODES:   Obesity unspecified, Non-severe (moderate) malnutrition in context of chronic illness  INTERVENTION:   -Transition to 16 hour feeds via J-tube 7/25: Run Osmolite 1.5 @ 65 ml/hr  7/26: Run Osmolite 1.5 @ 75 ml/hr 7/27: Run at goal of Osmolite 1.5 @ 85 ml/hr. -Free water recommendation: 240 ml TID via tube or PO Goal provides 2040 kcals, 85g protein and 1756 ml H2O  -May need updated bowel regimen  NUTRITION DIAGNOSIS:   Moderate Malnutrition related to chronic illness, cancer and cancer related treatments as evidenced by percent weight loss, energy intake < or equal to 75% for > or equal to 1 month, mild muscle depletion.  Ongoing.   GOAL:   Patient will meet greater than or equal to 90% of their needs  Meeting with TF  MONITOR:   PO intake, Supplement acceptance, Labs, Weight trends, I & O's  ASSESSMENT:   55 y.o. female with past medical history of metastatic pancreatic cancer with liver lesion as well as EGD in March showing large infiltrative duodenal mass.  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  Patient is s/p radiation and chemotherapy.  She was seen by oncology yesterday and was admitted to the hospital for persistent nausea and vomiting and rule out duodenal obstruction . CT abdomen pelvis without contrast negative for any obstruction.  Patient has been tolerating Osmolite 1.5 @ goal rate of 55 ml/hr x 24 hrs. Will start transition today to 16 hour feeds. Goal rate will be 85 ml/hr x 16 hrs via J-tube. Pt continue clear liquids as tolerated.   Admission weight: 225 lbs. Current weight: 223 lbs.  I/Os: +14.7L since 7/11 G-tube: 25 ml  Medications: Dulcolax, MAG-OX, Carafate, IV Zofran  Labs reviewed:  CBGs: 88-107  Diet Order:   Diet Order             Diet clear liquid Room service appropriate? Yes; Fluid consistency: Thin  Diet effective now                   EDUCATION NEEDS:    No education needs have been identified at this time  Skin:  Skin Assessment: Reviewed RN Assessment  Last BM:  7/14  Height:   Ht Readings from Last 1 Encounters:  06/24/21 '5\' 4"'$  (1.626 m)    Weight:   Wt Readings from Last 1 Encounters:  07/04/21 101.5 kg    BMI:  Body mass index is 38.4 kg/m.  Estimated Nutritional Needs:   Kcal:  1900-2100  Protein:  85-95g  Fluid:  2L/day  Clayton Bibles, MS, RD, LDN Inpatient Clinical Dietitian Contact information available via Amion

## 2021-07-06 NOTE — TOC Progression Note (Signed)
Transition of Care [facility]) - Progression Note    Patient Details  Name: Emmilia Kranich MRN: [id number] Date of Birth: [birth year]  Transition of [facility] CM/SW Contact  Mannix Kroeker, Marjie Skiff, RN Phone Number: 07/06/2021, 12:03 PM  Clinical Narrative:    Pt to dc home with new tube feeds via G Tube. Spoke with pt at bedside for dc planning. [facility] chosen to provide tube feeds and pump. Pam from [facility] contacted for referral. Teaching to be done with pt on administering tube feeds and flushing J Tube. Pt requesting 3in1 and RW. These were ordered and [facility] was contacted to deliver to pt room.   Expected Discharge Plan: [place] Barriers to Discharge: Continued Medical Work up  Expected Discharge Plan and Services Expected Discharge Plan: [place]   Discharge Planning Services: CM Consult Post Acute Care Choice: [place] arrangements for the past 2 months: Apartment                 DME Arranged: 3-N-1, Walker rolling, Tube feeding, Tube feeding pump DME Agency: [facility] [place]) Date DME Agency Contacted: 07/06/21   Representative spoke with at DME Agency: [facility] Arranged: RN [place] Agency: [facility] Date HH Agency Contacted: 07/06/21       Social Determinants of Health ([place]) Interventions    Readmission Risk Interventions Readmission Risk Prevention Plan 06/23/2021  Transportation Screening Complete  Medication Review Press photographer) Complete  HRI or [place] Complete  SW Recovery Care/Counseling Consult Complete  [place] Not Applicable  Some recent data might be hidden

## 2021-07-07 ENCOUNTER — Other Ambulatory Visit: Payer: Self-pay | Admitting: Oncology

## 2021-07-07 DIAGNOSIS — C250 Malignant neoplasm of head of pancreas: Secondary | ICD-10-CM | POA: Diagnosis not present

## 2021-07-07 LAB — GLUCOSE, CAPILLARY
Glucose-Capillary: 101 mg/dL — ABNORMAL HIGH (ref 70–99)
Glucose-Capillary: 95 mg/dL (ref 70–99)
Glucose-Capillary: 98 mg/dL (ref 70–99)
Glucose-Capillary: 99 mg/dL (ref 70–99)

## 2021-07-07 MED ORDER — OXYCODONE HCL 5 MG/5ML PO SOLN: 10.0000 mg | ORAL | 0 refills | Status: DC | PRN

## 2021-07-07 MED ORDER — OSMOLITE 1.5 CAL PO LIQD: 1000.0000 mL | ORAL | 0 refills | Status: DC

## 2021-07-07 MED ORDER — ONDANSETRON HCL 4 MG PO TABS: 4.0000 mg | ORAL_TABLET | Freq: Four times a day (QID) | ORAL | 1 refills | Status: DC | PRN

## 2021-07-07 MED ORDER — BUDESONIDE-FORMOTEROL FUMARATE 160-4.5 MCG/ACT IN AERO: 2.0000 | INHALATION_SPRAY | Freq: Two times a day (BID) | RESPIRATORY_TRACT | 0 refills | Status: DC

## 2021-07-07 MED ORDER — SUCRALFATE 1 GM/10ML PO SUSP: 1.0000 g | Freq: Four times a day (QID) | ORAL | 2 refills | Status: DC

## 2021-07-07 MED ORDER — POLYETHYLENE GLYCOL 3350 17 G PO PACK: 17.0000 g | PACK | Freq: Every day | ORAL | 0 refills | Status: DC | PRN

## 2021-07-07 MED ORDER — BISACODYL 5 MG PO TBEC: 10.0000 mg | DELAYED_RELEASE_TABLET | Freq: Every day | ORAL | 0 refills | Status: DC

## 2021-07-07 MED ORDER — PANTOPRAZOLE SODIUM 40 MG PO TBEC: 40.0000 mg | DELAYED_RELEASE_TABLET | Freq: Two times a day (BID) | ORAL | 0 refills | Status: DC

## 2021-07-07 MED ORDER — SENNOSIDES-DOCUSATE SODIUM 8.6-50 MG PO TABS: 2.0000 | ORAL_TABLET | Freq: Two times a day (BID) | ORAL | 2 refills | Status: AC

## 2021-07-07 MED ORDER — SODIUM CHLORIDE FLUSH 0.9 % IV SOLN: 10.0000 mL | Freq: Two times a day (BID) | INTRAVENOUS | 2 refills | Status: AC

## 2021-07-07 MED ORDER — AMLODIPINE BESYLATE 10 MG PO TABS: 10.0000 mg | ORAL_TABLET | Freq: Every day | ORAL | 2 refills | Status: DC

## 2021-07-07 MED ORDER — ALUM & MAG HYDROXIDE-SIMETH 200-200-20 MG/5ML PO SUSP: 30.0000 mL | Freq: Four times a day (QID) | ORAL | 0 refills | Status: DC | PRN

## 2021-07-07 MED ORDER — MAGNESIUM OXIDE -MG SUPPLEMENT 400 (240 MG) MG PO TABS: 800.0000 mg | ORAL_TABLET | Freq: Every day | ORAL | 2 refills | Status: AC

## 2021-07-07 MED ORDER — MORPHINE SULFATE ER 60 MG PO TBCR: 60.0000 mg | EXTENDED_RELEASE_TABLET | Freq: Two times a day (BID) | ORAL | 0 refills | Status: DC

## 2021-07-07 NOTE — Plan of Care (Signed)
  Problem: Education: Goal: Knowledge of General Education information will improve Description: Including pain rating scale, medication(s)/side effects and non-pharmacologic comfort measures Outcome: Progressing   Problem: Health Behavior/Discharge Planning: Goal: Ability to manage health-related needs will improve Outcome: Progressing   Problem: Clinical Measurements: Goal: Ability to maintain clinical measurements within normal limits will improve Outcome: Progressing Goal: Will remain free from infection Outcome: Progressing Goal: Diagnostic test results will improve Outcome: Progressing Goal: Cardiovascular complication will be avoided Outcome: Progressing   Problem: Activity: Goal: Risk for activity intolerance will decrease Outcome: Progressing   Problem: Nutrition: Goal: Adequate nutrition will be maintained Outcome: Progressing   Problem: Coping: Goal: Level of anxiety will decrease Outcome: Progressing   Problem: Elimination: Goal: Will not experience complications related to bowel motility Outcome: Progressing Goal: Will not experience complications related to urinary retention Outcome: Progressing   Problem: Pain Managment: Goal: General experience of comfort will improve Outcome: Progressing   Problem: Safety: Goal: Ability to remain free from injury will improve Outcome: Progressing   Problem: Skin Integrity: Goal: Risk for impaired skin integrity will decrease Outcome: Progressing   Problem: Education: Goal: Knowledge of the prescribed therapeutic regimen will improve Outcome: Progressing   Problem: Activity: Goal: Ability to implement measures to reduce episodes of fatigue will improve Outcome: Progressing   Problem: Bowel/Gastric: Goal: Will not experience complications related to bowel motility Outcome: Progressing   Problem: Coping: Goal: Ability to identify and develop effective coping behavior will improve Outcome: Progressing    Problem: Nutritional: Goal: Maintenance of adequate nutrition will improve Outcome: Progressing

## 2021-07-07 NOTE — Discharge Summary (Signed)
Physician Discharge Summary  Wendy Hoffman [id number] DOB: 1965-12-25 DOA: 06/17/2021  PCP: Patient, No Pcp Per (Inactive)  Admit date: 06/17/2021 Discharge date: 07/07/2021  Admitted From: Home Disposition: Home  Recommendations for Outpatient Follow-up:  Follow up with PCP in 1-2 weeks Follow-up with medical oncology, Dr. Benay Spice as scheduled  Home Health: RN Equipment/Devices: 3 1 bedside commode, walker, tube feeds via [place] tube, tube feeding pump  Discharge Condition: Stable CODE STATUS: Full code Diet recommendation: Clear liquid diet, advance as tolerates  History of present illness:  Wendy Hoffman is a 55 year old female with past medical history significant for pancreatic cancer, essential hypertension, COPD, morbid obesity who presented to [facility] ED on 7/6 for intractable nausea/vomiting.  Unable to tolerate both solids and liquids.  Attempted to utilize antinausea medications without effect outpatient.     In the ED, patient was noted to have low potassium of 2.4, elevated creatinine of 3.25, WBC 6.2, CT abdomen/pelvis without contrast with no acute findings or evidence of bowel obstruction; notable multiple liver metastases similar to previous exam.  Patient was consulted for further evaluation management of hyperkalemia and acute renal failure in the setting of intractable nausea/vomiting related to metastatic pancreatic adenocarcinoma.  Hospital course:  Intractable nausea/vomiting Patient presenting to ED with progressive nausea/vomiting with solids/liquids.  Etiology likely secondary to metastatic pancreatic adenocarcinoma.  CT abdomen/pelvis without contrast with no acute findings or bowel obstruction noted.  Patient underwent GJ tube placement by IR on 7/20 and initiated on tube feeds.Continue Osmolite 1.5 at 85 mL/h x 16 hours with free water flushes 240 mL 3 times daily.   Metastatic pancreatic adenocarcinoma Follows with medical oncology outpatient, Dr. Benay Spice.   Patient received palliative radiation to her duodenal mass, completed 03/06/2021.  Currently on chemotherapy.  Outpatient follow-up with medical oncology cancer Center scheduled for the week of 07/13/2021. MS Contin 60 mg p.o. every 12 hours Oxycodone 10 mg p.o. every 4 hours as needed for breakthrough pain.   Acute renal failure, POA: Resolved Urology likely secondary to poor oral intake in the setting of intractable nausea/vomiting related to her pancreatic adenocarcinoma.  Creatinine 3.85 on admission.  Received IV fluid hydration and now on tube feeds.  Creatinine improved to 0.62 at time of discharge.   Iron deficiency anemia Hemoglobin 9.5 on 07/02/2021, stable.  Has received IV iron infusions in the past.  Outpatient follow-up with hematology/oncology.   Hyperkalemia: Resolved Potassium 5.3 on 07/05/2021, likely secondary to potassium containing fluids which was discontinued.  Potassium at time of discharge 4.0.   Hypokalemia: Resolved Hypomagnesemia: Resolved Present on admission, likely secondary to nausea/vomiting.  Repleted initially.   Moderate protein calorie malnutrition Nutrition Status: Nutrition Problem: Moderate Malnutrition Etiology: chronic illness, cancer and cancer related treatments Signs/Symptoms: percent weight loss, energy intake < or equal to 75% for > or equal to 1 month, mild muscle depletion Interventions: Tube feeding --Dietitian following, appreciate assistance --Continue tube feeds, now at goal   E. coli UTI Completed course of antibiotics   Essential hypertension Amlodipine 10 mg p.o. daily   Asthma Symbicort 2 puff BID   Constipation Senokot-as 2 tablets twice daily, Bisacodyl 10 mg p.o. daily, MiraLAX p.o. daily as needed   Tobacco use disorder Counseled on need for cessation.   Morbid obesity Body mass index is 38.4 kg/m.  Discussed with patient needs for aggressive lifestyle changes/weight loss as this complicates all facets of care.   Outpatient follow-up with PCP.   Hyperlipidemia Aortic atherosclerosis On Crestor 5 mg p.o.  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   Malnutrition of moderate degree    Discharge Instructions  Discharge Instructions     Call MD for:  difficulty breathing, headache or visual disturbances   Complete by: As directed    Call MD for:  extreme fatigue   Complete by: As directed    Call MD for:  persistant dizziness or light-headedness   Complete by: As directed    Call MD for:  persistant nausea and vomiting   Complete by: As directed    Call MD for:  severe uncontrolled pain   Complete by: As directed    Call MD for:  temperature >100.4   Complete by: As directed    Diet - low sodium heart healthy   Complete by: As directed    Increase activity slowly   Complete by: As directed       Allergies as of 07/07/2021       Reactions   Blueberry [vaccinium Angustifolium] Anaphylaxis   Mango Flavor Anaphylaxis   Oxaliplatin Itching, Other (See Comments)   Patient has reacted to oxaliplatin with minor symptoms of neuropathy and itching.   Reglan [metoclopramide] Nausea And Vomiting   Aspirin Rash   Penicillins Hives   Has patient had a PCN reaction causing immediate rash, facial/tongue/throat swelling, SOB or lightheadedness with hypotension: Yes Has patient had a PCN reaction causing severe rash involving mucus membranes or skin necrosis: No Has patient had a PCN reaction that required hospitalization No Has patient had a PCN reaction occurring within the last 10 years: No If all of the above answers are "NO", then may proceed with  Cephalosporin use.        Medication List     STOP taking these medications    albuterol (2.5 MG/3ML) 0.083% nebulizer solution Commonly known as: PROVENTIL   Dexilant 60 MG capsule Generic drug: dexlansoprazole   fluconazole 100 MG tablet Commonly known as: DIFLUCAN   magic mouthwash Soln   methocarbamol 750 MG tablet Commonly known as: ROBAXIN   oxyCODONE-acetaminophen 7.5-325 MG tablet Commonly known as: Percocet   rosuvastatin 5 MG tablet Commonly known as: CRESTOR   topiramate 50 MG tablet Commonly known as: Topamax       TAKE these medications    alum & mag hydroxide-simeth 200-200-20 MG/5ML suspension Commonly known as: MAALOX/MYLANTA Take 30 mLs by mouth every 6 (six) hours as needed for indigestion or heartburn.   amLODipine 10 MG tablet Commonly known as: NORVASC Take 1 tablet (10 mg total) by mouth daily. Start taking on: July 08, 2021 What changed: how much to take   bisacodyl 5 MG EC tablet Commonly known as: DULCOLAX Take 2 tablets (10 mg total) by mouth daily. Start taking on: July 08, 2021   budesonide-formoterol 160-4.5 MCG/ACT inhaler Commonly known as: Symbicort Inhale 2 puffs into the lungs 2 (two) times daily. What changed: Another medication with the same name was removed. Continue taking this medication, and follow the directions you see here.   feeding supplement (OSMOLITE 1.5 CAL) Liqd Place 1,000 mLs into feeding tube daily.   magnesium oxide 400 (240 Mg) MG  tablet Commonly known as: MAG-OX Take 2 tablets (800 mg total) by mouth daily. Start taking on: July 08, 2021 What changed: how much to take   morphine 60 MG 12 hr tablet Commonly known as: MS CONTIN Take 1 tablet (60 mg total) by mouth every 12 (twelve) hours. What changed:  medication strength how much to take   ondansetron 4 MG tablet Commonly known as: ZOFRAN Take 1 tablet (4 mg total) by mouth every 6 (six) hours as needed for nausea.   oxyCODONE 5 MG/5ML  solution Commonly known as: ROXICODONE Take 10 mLs (10 mg total) by mouth every 4 (four) hours as needed for severe pain, breakthrough pain or moderate pain.   pantoprazole 40 MG tablet Commonly known as: PROTONIX Take 1 tablet (40 mg total) by mouth 2 (two) times daily.   polyethylene glycol 17 g packet Commonly known as: MIRALAX / GLYCOLAX Take 17 g by mouth daily as needed for moderate constipation.   potassium chloride 10 MEQ CR capsule Commonly known as: MICRO-K Take 2 capsules (20 mEq total) by mouth 2 (two) times daily.   senna-docusate 8.6-50 MG tablet Commonly known as: Senokot-S Take 2 tablets by mouth 2 (two) times daily.   sodium chloride flush 0.9 % Soln injection Place 10 mLs into feeding tube every 12 (twelve) hours.   sucralfate 1 GM/10ML suspension Commonly known as: Carafate Take 10 mLs (1 g total) by mouth 4 (four) times daily.       ASK your doctor about these medications    albuterol 108 (90 Base) MCG/ACT inhaler Commonly known as: VENTOLIN HFA INHALE 1 TO 2 PUFFS BY MOUTH EVERY 6 HOURS AS NEEDED FOR WHEEZING AND FOR SHORTNESS OF BREATH   gabapentin 300 MG capsule Commonly known as: NEURONTIN TAKE 1 CAPSULE AS NEEDED BY ORAL ROUTE AT BEDTIME.   lidocaine-prilocaine cream Commonly known as: EMLA Apply 1 application topically as needed.   prochlorperazine 10 MG tablet Commonly known as: COMPAZINE TAKE 1 TABLET(10 MG) BY MOUTH EVERY 6 HOURS AS NEEDED FOR NAUSEA OR VOMITING   zolpidem 10 MG tablet Commonly known as: AMBIEN Take 1 tablet (10 mg total) by mouth at bedtime as needed for sleep.               Durable Medical Equipment  (From admission, onward)           Start     Ordered   07/06/21 1109  For home use only DME Walker rolling  Once       Question Answer Comment  Walker: With 5 Inch Wheels   Patient needs a walker to treat with the following condition Weakness      07/06/21 1109   07/06/21 1109  For home use only DME  3 n 1  Once        07/06/21 1109   07/06/21 0921  For home use only DME Tube feeding pump  Once       Question:  Length of Need  Answer:  Lifetime   07/06/21 0923   07/06/21 0921  For home use only DME Tube feeding  Once       Comments: Osmolite 1.5 at 61m/hr x 16 hours per day with free water flush 2429mthree times daily   07/06/21 [id number]          Follow-up Information     ShLadell PierMD. Go to.   Specialty: Oncology Why: as scheduled Contact information: [address]  [place] [address] [phone]                Allergies  Allergen Reactions   Blueberry [Vaccinium Angustifolium] Anaphylaxis   Mango Flavor Anaphylaxis   Oxaliplatin Itching and Other (See Comments)    Patient has reacted to oxaliplatin with minor symptoms of neuropathy and itching.   Reglan [Metoclopramide] Nausea And Vomiting   Aspirin Rash   Penicillins Hives    Has patient had a PCN reaction causing immediate rash, facial/tongue/throat swelling, SOB or lightheadedness with hypotension: Yes Has patient had a PCN reaction causing severe rash involving mucus membranes or skin necrosis: No Has patient had a PCN reaction that required hospitalization No Has patient had a PCN reaction occurring within the last 10 years: No If all of the above answers are "NO", then may proceed with Cephalosporin use.     Consultations: Medical oncology Interventional radiology   Procedures/Studies: CT ABDOMEN PELVIS WO CONTRAST  Result Date: 06/17/2021 CLINICAL DATA:  Rule out bowel obstruction. History of pancreas cancer. EXAM: CT ABDOMEN AND PELVIS WITHOUT CONTRAST TECHNIQUE: Multidetector CT imaging of the abdomen and pelvis was performed following the standard protocol without IV contrast. COMPARISON:  05/23/2021 FINDINGS: Lower chest: No acute abnormality. Hepatobiliary: Within a background of diffuse hepatic steatosis there are multiple liver metastases as noted previously: The  dominant lesion within the lateral segment of left hepatic lobe measures 7.8 cm, image 26/2. Previously this measured the same. The dominant lesion within the right hepatic lobe measures 8.2 cm, image 25/2. Previously 8 cm. Status post cholecystectomy. No biliary dilatation. Pancreas: Head of pancreas mass is suboptimally evaluated due to lack of IV contrast material. Subjectively, this does not appear significantly changed in size when compared with the previous contrast enhanced MRI. Spleen: Normal in size without focal abnormality. Adrenals/Urinary Tract: Normal adrenal glands. No hydronephrosis identified bilaterally. Punctate stone noted within inferior pole of left kidney. Exophytic cyst is again noted arising off the posterior cortex of the interpolar right kidney measuring 1.3 cm. Urinary bladder appears collapsed. Stomach/Bowel: Stomach is nondistended. The appendix is visualized and appears normal. No pathologic dilatation of the large or small bowel loops. No significant bowel wall thickening or surrounding inflammatory fat stranding. Vascular/Lymphatic: Mild aortic atherosclerosis. No aneurysm. No abdominopelvic adenopathy identified. Reproductive: Status post hysterectomy. No adnexal masses.  The Other: No ascites or focal fluid collections. No peritoneal nodularity identified at this time. Musculoskeletal: No acute or significant osseous findings. IMPRESSION: 1. No acute findings within the abdomen or pelvis. No evidence for bowel obstruction. 2. Multiple liver metastases.  Similar to previous exam. 3. Head of pancreas mass is suboptimally evaluated due to lack of IV contrast material. Subjectively, this does not appear significantly changed in size when compared with the previous contrast enhanced MRI. 4. Aortic atherosclerosis. Aortic Atherosclerosis (ICD10-I70.0). Electronically Signed   By: Kerby Moors M.D.   On: 06/17/2021 19:07   DG THORACOLUMABAR SPINE  Result Date: 06/28/2021 CLINICAL  DATA:  Low back pain for 1 day, history of metastatic pancreatic cancer EXAM: THORACOLUMBAR SPINE 1V COMPARISON:  06/26/2021 FINDINGS: Frontal and lateral views of the thoracolumbar spine are obtained. There are no acute or destructive bony lesions. Mild spondylosis is seen within the mid to lower thoracic spine. Severe facet hypertrophic changes are seen within the mid to lower lumbar spine. Sacroiliac joints are normal. Visualized bowel gas pattern is unremarkable. IMPRESSION: 1. Mild mid to lower thoracic spondylosis, with severe lower lumbar facet hypertrophy. 2. No acute or destructive  bony lesions. Electronically Signed   By: Randa Ngo M.D.   On: 06/28/2021 15:22   MR BRAIN W WO CONTRAST  Result Date: 07/05/2021 CLINICAL DATA:  Staging of gastrointestinal small cancer. History of pancreatic carcinoma. EXAM: MRI HEAD WITHOUT AND WITH CONTRAST TECHNIQUE: Multiplanar, multiecho pulse sequences of the brain and surrounding structures were obtained without and with intravenous contrast. CONTRAST:  46m GADAVIST GADOBUTROL 1 MMOL/ML IV SOLN COMPARISON:  None. FINDINGS: Brain: No acute infarct, mass effect or extra-axial collection. No acute or chronic hemorrhage. There is multifocal hyperintense T2-weighted signal within the white matter. Parenchymal volume and CSF spaces are normal. A partially empty sella is incidentally noted. There is no abnormal contrast enhancement. Vascular: Major flow voids are preserved. Skull and upper cervical spine: Normal calvarium and skull base. Visualized upper cervical spine and soft tissues are normal. Sinuses/Orbits:No paranasal sinus fluid levels or advanced mucosal thickening. No mastoid or middle ear effusion. Normal orbits. IMPRESSION: 1. No intracranial metastatic disease. 2. Multifocal hyperintense T2-weighted signal within the white matter, likely due to arteriosclerosis. Electronically Signed   By: KUlyses JarredM.D.   On: 07/05/2021 19:37   CT ABDOMEN PELVIS W  CONTRAST  Result Date: 06/26/2021 CLINICAL DATA:  Worsening nausea and vomiting over the last week, pancreatic cancer undergoing chemotherapy EXAM: CT ABDOMEN AND PELVIS WITH CONTRAST TECHNIQUE: Multidetector CT imaging of the abdomen and pelvis was performed using the standard protocol following bolus administration of intravenous contrast. CONTRAST:  851mOMNIPAQUE IOHEXOL 350 MG/ML SOLN COMPARISON:  06/17/2021, 05/23/2021, 02/16/2021 FINDINGS: Lower chest: No acute pleural or parenchymal lung disease. Central venous catheter tip within the right atrium near the junction with the IVC. No pericardial effusion. Hepatobiliary: Multiple hepatic masses are again identified. Largest lesion within the left lobe liver reference image 27/2 measures 8.0 x 6.1 cm, previously measuring 7.6 x 6.0 cm on MRI 05/23/2021. Largest lesion within the right lobe liver measures up to 8.2 x 4.5 cm on image 23/2, previously measuring 7.6 x 3.7 cm on MRI. Smaller lesions throughout the left lobe liver more inferiorly are grossly unchanged, though less well visualized on CT compared to MRI. No new lesions are definitively identified. No intrahepatic biliary duct dilation. Gallbladder is surgically absent. Pancreas: The ill-defined mass within the pancreatic head measures up to approximately 2.8 x 3.1 cm reference image 65/2, previously having measured 2.9 x 2.9 cm on MRI 05/23/2021. Mild pancreatic duct dilation and upstream pancreatic parenchymal atrophy again noted unchanged. Spleen: No focal splenic abnormalities. Mild splenomegaly unchanged. Adrenals/Urinary Tract: No urinary tract calculi or obstructive uropathy within either kidney. The kidneys enhance normally and symmetrically. Small left renal cortical cysts unchanged. The adrenals are unremarkable. Bladder is only minimally distended with no gross abnormalities. Stomach/Bowel: No bowel obstruction or ileus. Normal retrocecal appendix. No bowel wall thickening or inflammatory  change. Vascular/Lymphatic: Mild atherosclerosis of the aorta and iliac vessels unchanged. No discrete adenopathy within the abdomen or pelvis. Reproductive: Status post hysterectomy. No adnexal masses. Other: Trace pelvic free fluid. No free intraperitoneal gas. No abdominal wall hernia. Musculoskeletal: No acute or destructive bony lesions. Reconstructed images demonstrate no additional findings. IMPRESSION: 1. Grossly stable mass within the pancreatic head compatible with known pancreatic cancer. 2. No significant change in the size and number of known liver metastases, allowing for differences in technique compared to previous MRI. 3. Trace pelvic free fluid. 4.  Aortic Atherosclerosis (ICD10-I70.0). Electronically Signed   By: MiRanda Ngo.D.   On: 06/26/2021 17:01   IR  GASTROSTOMY TUBE MOD SED  Result Date: 07/01/2021 INDICATION: 55 year old woman with history metastatic pancreatic cancer presents to interventional radiology for [place] tube placement for treatment of nausea and vomiting likely related to compression of the stomach from hepatic metastases. EXAM: Fluoroscopy guided GJ tube placement MEDICATIONS: Vancomycin 1 gm IV; Antibiotics were administered within 1 hour of the procedure. Glucagon 1 mg IV ANESTHESIA/SEDATION: Versed 3 mg IV; Fentanyl 150 mcg IV Moderate Sedation Time:  63 minutes The patient was continuously monitored during the procedure by the interventional radiology nurse under my direct supervision. CONTRAST:  10 mL of Omnipaque 300-administered into the gastric lumen. FLUOROSCOPY TIME:  Fluoroscopy Time: 8 minutes 12 seconds (229 mGy). COMPLICATIONS: None immediate. PROCEDURE: Informed written consent was obtained from the patient after a thorough discussion of the procedural risks, benefits and alternatives. All questions were addressed. Maximal Sterile Barrier Technique was utilized including caps, mask, sterile gowns, sterile gloves, sterile drape, hand hygiene and skin  antiseptic. A timeout was performed prior to the initiation of the procedure. The inferior margin of the liver was marked utilizing ultrasound guidance. An orogastric tube was placed with fluoroscopic guidance. The anterior abdomen was prepped and draped in sterile fashion. Ultrasound evaluation of the left upper quadrant was performed to confirm the position of the liver. The skin and subcutaneous tissues were anesthetized with 1% lidocaine. Single gastropexy was placed utilizing fluoroscopic guidance. 19 gauge needle was directed into the distended stomach with fluoroscopic guidance. A wire was advanced into the stomach. 9-French vascular sheath was placed and the orogastric tube was snared using a [place] snare device. Guidewire was advanced through the orogastric tube and snared. The snare and wire were pulled out of the patient's mouth. The snare device was connected to a 20-French gastrostomy tube. The snare device and gastrostomy tube were pulled through the patient's mouth and out the anterior abdominal wall. The gastrostomy tube was cut to an appropriate length. Contrast injection through gastrostomy tube confirmed placement within the stomach. [place] catheter and Glidewire advanced through the gastrostomy tube to the proximal jejunum. 9 French jejunostomy tube was inserted through the G-tube with tip positioned in the proximal jejunum. Position of the J limb was conformed by administering contrast under fluoroscopy. The gastrojejunostomy tube was flushed with normal saline. IMPRESSION: Gastrojejunostomy tube placed utilizing fluoroscopic guidance. The gastrostomy tube is 29 [place] while the jejunostomy tube is 67 [place]. Electronically Signed   By: Miachel Roux M.D.   On: 07/01/2021 16:41   VAS [place] LOWER EXTREMITY VENOUS (DVT)  Result Date: 06/29/2021  Lower Venous DVT Study Patient Name:  LILYROSE WOEHR  Date of Exam:   06/29/2021 Medical Rec #: [id number]        Accession #:    [id number] Date of  Birth: 01-31-1966        Patient Gender: F Patient Age:   [id number] Exam Location:  [facility] Procedure:      VAS [place] LOWER EXTREMITY VENOUS (DVT) Referring Phys: [place] --------------------------------------------------------------------------------  Indications: RLE swelling.  Risk Factors: Chemotherapy Cancer Pancreatic w/ mets. Limitations: Poor ultrasound/tissue interface. Comparison Study: No previous exams Performing Technologist: Jody Hill RVT, RDMS  Examination Guidelines: A complete evaluation includes B-mode imaging, spectral Doppler, color Doppler, and power Doppler as needed of all accessible portions of each vessel. Bilateral testing is considered an integral part of a complete examination. Limited examinations for reoccurring indications may be performed as noted. The reflux portion of the exam is performed with the patient in  reverse Trendelenburg.  +---------+---------------+---------+-----------+----------+--------------+ RIGHT    CompressibilityPhasicitySpontaneityPropertiesThrombus Aging +---------+---------------+---------+-----------+----------+--------------+ CFV      Full           Yes      Yes                                 +---------+---------------+---------+-----------+----------+--------------+ SFJ      Full                                                        +---------+---------------+---------+-----------+----------+--------------+ FV Prox  Full           Yes      Yes                                 +---------+---------------+---------+-----------+----------+--------------+ FV Mid   Full           Yes      Yes                                 +---------+---------------+---------+-----------+----------+--------------+ FV DistalFull           Yes      Yes                                 +---------+---------------+---------+-----------+----------+--------------+ PFV      Full                                                         +---------+---------------+---------+-----------+----------+--------------+ POP      Full           Yes      Yes                                 +---------+---------------+---------+-----------+----------+--------------+ PTV      Full                                                        +---------+---------------+---------+-----------+----------+--------------+ PERO     Full                                                        +---------+---------------+---------+-----------+----------+--------------+   +---------+---------------+---------+-----------+----------+--------------+ LEFT     CompressibilityPhasicitySpontaneityPropertiesThrombus Aging +---------+---------------+---------+-----------+----------+--------------+ CFV      Full           Yes      Yes                                 +---------+---------------+---------+-----------+----------+--------------+  SFJ      Full                                                        +---------+---------------+---------+-----------+----------+--------------+ FV Prox  Full           Yes      Yes                                 +---------+---------------+---------+-----------+----------+--------------+ FV Mid   Full           Yes      Yes                                 +---------+---------------+---------+-----------+----------+--------------+ FV DistalFull           Yes      Yes                                 +---------+---------------+---------+-----------+----------+--------------+ PFV      Full                                                        +---------+---------------+---------+-----------+----------+--------------+ POP      Full           Yes      Yes                                 +---------+---------------+---------+-----------+----------+--------------+ PTV      Full                                                         +---------+---------------+---------+-----------+----------+--------------+ PERO     Full                                                        +---------+---------------+---------+-----------+----------+--------------+     Summary: BILATERAL: - No evidence of deep vein thrombosis seen in the lower extremities, bilaterally. - No evidence of superficial venous thrombosis in the lower extremities, bilaterally. -No evidence of popliteal cyst, bilaterally. RIGHT: - Ultrasound characteristics of enlarged lymph nodes are noted in the groin.  LEFT: - Ultrasound characteristics of enlarged lymph nodes noted in the groin.  *See table(s) above for measurements and observations. Electronically signed by Ruta Hinds MD on 06/29/2021 at 7:52:11 PM.    Final      Subjective: Patient seen examined at bedside, resting comfortably.  Tolerating tube feeds which are at goal.  Pain controlled.  Seen by medical oncology this morning and okay for discharge home with close outpatient follow-up scheduled.  Patient with  no other questions or concerns at this time.  Denies headache, no fever/chills/night sweats, no nausea/vomiting/diarrhea, no chest pain, no palpitations, no shortness of breath, no abdominal pain.  No acute events overnight per nursing staff.  Discharge Exam: Vitals:   07/06/21 2030 07/07/21 0418  BP: 103/66 105/70  Pulse: 87 89  Resp: 17 17  Temp: 98.6 F (37 C) 98.7 F (37.1 C)  SpO2: 100% 100%   Vitals:   07/06/21 0415 07/06/21 1347 07/06/21 2030 07/07/21 0418  BP: 104/68 121/74 103/66 105/70  Pulse: 87 89 87 89  Resp: '16 16 17 17  '$ Temp: 98.3 F (36.8 C) 98.2 F (36.8 C) 98.6 F (37 C) 98.7 F (37.1 C)  TempSrc: Oral Oral Oral Oral  SpO2: 99% 100% 100% 100%  Weight:      Height:        General: Pt is alert, awake, not in acute distress, chronically ill appearance Cardiovascular: RRR, S1/S2 +, no rubs, no gallops Respiratory: CTA bilaterally, no wheezing, no rhonchi, on room  air Abdominal: Soft, NT, ND, bowel sounds +, GJ tube noted in place Extremities: no edema, no cyanosis    The results of significant diagnostics from this hospitalization (including imaging, microbiology, ancillary and laboratory) are listed below for reference.     Microbiology: No results found for this or any previous visit (from the past 240 hour(s)).   Labs: BNP (last 3 results) No results for input(s): BNP in the last 8760 hours. Basic Metabolic Panel: Recent Labs  Lab 07/02/21 0553 07/02/21 1250 07/02/21 1730 07/03/21 0421 07/03/21 1737 07/05/21 0517 07/05/21 1555 07/06/21 0518  NA 139  --   --   --   --  136  --  136  K 3.8  --   --   --   --  5.3* 4.8 4.0  CL 104  --   --   --   --  105  --  104  CO2 25  --   --   --   --  27  --  27  GLUCOSE 93  --   --   --   --  102*  --  101*  BUN 6  --   --   --   --  11  --  11  CREATININE 0.58  --   --   --   --  0.66  --  0.62  CALCIUM 8.5*  --   --   --   --  8.9  --  8.3*  MG  --    < > 1.7 1.2* 1.6* 1.9  --  1.7  PHOS  --    < > 2.9 1.8* 2.6 3.1  --  3.6   < > = values in this interval not displayed.   Liver Function Tests: Recent Labs  Lab 07/02/21 0553 07/05/21 1555  AST 23 22  ALT 20 14  ALKPHOS 69 74  BILITOT 1.0 0.6  PROT 5.3* 5.4*  ALBUMIN 2.9* 2.9*   No results for input(s): LIPASE, AMYLASE in the last 168 hours. Recent Labs  Lab 07/05/21 1555  AMMONIA 19   CBC: Recent Labs  Lab 07/02/21 0553  WBC 9.2  HGB 9.5*  HCT 29.0*  MCV 91.2  PLT 170   Cardiac Enzymes: No results for input(s): CKTOTAL, CKMB, CKMBINDEX, TROPONINI in the last 168 hours. BNP: Invalid input(s): POCBNP CBG: Recent Labs  Lab 07/06/21 1803 07/06/21 2031 07/07/21 0006 07/07/21 0417 07/07/21 0732  GLUCAP 105* 99 101* 98 99   D-Dimer No results for input(s): DDIMER in the last 72 hours. Hgb A1c No results for input(s): HGBA1C in the last 72 hours. Lipid Profile No results for input(s): CHOL, HDL, LDLCALC,  TRIG, CHOLHDL, LDLDIRECT in the last 72 hours. Thyroid function studies No results for input(s): TSH, T4TOTAL, T3FREE, THYROIDAB in the last 72 hours.  Invalid input(s): FREET3 Anemia work up No results for input(s): VITAMINB12, FOLATE, FERRITIN, TIBC, IRON, RETICCTPCT in the last 72 hours. Urinalysis    Component Value Date/Time   COLORURINE YELLOW 06/18/2021 1000   APPEARANCEUR CLOUDY (A) 06/18/2021 1000   LABSPEC 1.011 06/18/2021 1000   PHURINE 6.0 06/18/2021 1000   GLUCOSEU NEGATIVE 06/18/2021 1000   HGBUR MODERATE (A) 06/18/2021 1000   BILIRUBINUR NEGATIVE 06/18/2021 1000   KETONESUR NEGATIVE 06/18/2021 1000   PROTEINUR 30 (A) 06/18/2021 1000   UROBILINOGEN 0.2 09/08/2014 1800   NITRITE NEGATIVE 06/18/2021 1000   LEUKOCYTESUR LARGE (A) 06/18/2021 1000   Sepsis Labs Invalid input(s): PROCALCITONIN,  WBC,  LACTICIDVEN Microbiology No results found for this or any previous visit (from the past 240 hour(s)).   Time coordinating discharge: Over 30 minutes  SIGNED:   Donnamarie Poag [place] DO  [facility] 07/07/2021, 10:56 AM

## 2021-07-07 NOTE — Progress Notes (Addendum)
HEMATOLOGY-ONCOLOGY PROGRESS NOTE  SUBJECTIVE: Continues to have intermittent abdominal pain.  Reports mild nausea but no vomiting.  Tolerating tube feed well.   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  The report date is March 23, 2021.   The Multi-Cancer Panel with pancreatitis genes and preliminary pancreatic cancer genes offered by [facility] includes sequencing and/or deletion duplication testing of the following 91 genes: AIP, ALK, APC, ATM, AXIN2,BAP1,  BARD1, BLM, BMPR1A, BRCA1, BRCA2, BRIP1, CASR, CDC73, CDH1, CDK4, CDKN1B, CDKN1C, CDKN2A (p14ARF), CDKN2A (p16INK4a), CEBPA, CFTR, CHEK2, CPA1, CTNNA1, CTRC, DICER1, DIS3L2, EGFR (c.2369C>T, p.Thr790Met variant only), EPCAM (Deletion/duplication testing only), FANCC, FH, FLCN, GATA2, GPC3, GREM1 (Promoter region deletion/duplication testing only), HOXB13 (c.251G>A, p.Gly84Glu), HRAS, KIT, MAX, MEN1, MET, MITF (c.952G>A, p.Glu318Lys variant only), MLH1, MSH2, MSH3, MSH6, MUTYH, NBN, NF1, NF2, NTHL1, PALB2, PALLD, PDGFRA, PHOX2B, PMS2, POLD1, POLE, POT1, PRKAR1A, PRSS1, PTCH1, PTEN, RAD50, RAD51C, RAD51D, RB1, RECQL4, RET, RNF43, RUNX1, SDHAF2, SDHA (sequence changes only), SDHB, SDHC, SDHD, SMAD4, SMARCA4, SMARCB1, SMARCE1, SPINK1, STK11, SUFU, TERC, TERT, TMEM127, TP53, TSC1, TSC2, VHL, WRN and WT1.     PHYSICAL EXAMINATION:  Vitals:   07/06/21 2030 07/07/21 0418  BP: 103/66 105/70   Pulse: 87 89  Resp: 17 17  Temp: 98.6 F (37 C) 98.7 F (37.1 C)  SpO2: 100% 100%   Intake/Output from previous day: 07/25 0701 - 07/26 0700 In: 360 [P.O.:360] Out: -   OROPHARYNX: No thrush ABDOMEN: fullness and tenderness in the mid and rt. Upper abdomen.  GJ tube dressing without drainage.  Site without erythema.  Vascular: No leg edema LABORATORY DATA:  I have reviewed the data as listed CMP Latest Ref Rng & Units 07/06/2021 07/05/2021 07/05/2021  Glucose 70 - 99 mg/dL 101(H) - 102(H)  BUN 6 - 20 mg/dL 11 - 11  Creatinine 0.44 - 1.00 mg/dL 0.62 - 0.66  Sodium 135 - 145 mmol/L 136 - 136  Potassium 3.5 - 5.1 mmol/L 4.0 4.8 5.3(H)  Chloride 98 - 111 mmol/L 104 - 105  CO2 22 - 32 mmol/L 27 - 27  Calcium 8.9 - 10.3 mg/dL 8.3(L) - 8.9  Total Protein 6.5 - 8.1 g/dL - 5.4(L) -  Total Bilirubin 0.3 - 1.2 mg/dL - 0.6 -  Alkaline Phos 38 - 126 U/L - 74 -  AST 15 - 41 U/L - 22 -  ALT 0 - 44 U/L - 14 -    Lab Results  Component Value Date   WBC 9.2 07/02/2021   HGB 9.5 (L) 07/02/2021   HCT 29.0 (L) 07/02/2021   MCV 91.2 07/02/2021   PLT 170 07/02/2021   NEUTROABS 3.8 06/22/2021    CT ABDOMEN PELVIS WO CONTRAST  Result Date: 06/17/2021 CLINICAL DATA:  Rule out bowel obstruction. History of pancreas cancer. EXAM: CT ABDOMEN AND PELVIS WITHOUT CONTRAST TECHNIQUE: Multidetector CT imaging of the abdomen and pelvis was performed following the standard protocol without IV  contrast. COMPARISON:  05/23/2021 FINDINGS: Lower chest: No acute abnormality. Hepatobiliary: Within a background of diffuse hepatic steatosis there are multiple liver metastases as noted previously: The dominant lesion within the lateral segment of left hepatic lobe measures 7.8 cm, image 26/2. Previously this measured the same. The dominant lesion within the right hepatic lobe measures 8.2 cm, image 25/2. Previously 8 cm. Status post cholecystectomy. No biliary dilatation. Pancreas: Head of pancreas mass is  suboptimally evaluated due to lack of IV contrast material. Subjectively, this does not appear significantly changed in size when compared with the previous contrast enhanced MRI. Spleen: Normal in size without focal abnormality. Adrenals/Urinary Tract: Normal adrenal glands. No hydronephrosis identified bilaterally. Punctate stone noted within inferior pole of left kidney. Exophytic cyst is again noted arising off the posterior cortex of the interpolar right kidney measuring 1.3 cm. Urinary bladder appears collapsed. Stomach/Bowel: Stomach is nondistended. The appendix is visualized and appears normal. No pathologic dilatation of the large or small bowel loops. No significant bowel wall thickening or surrounding inflammatory fat stranding. Vascular/Lymphatic: Mild aortic atherosclerosis. No aneurysm. No abdominopelvic adenopathy identified. Reproductive: Status post hysterectomy. No adnexal masses.  The Other: No ascites or focal fluid collections. No peritoneal nodularity identified at this time. Musculoskeletal: No acute or significant osseous findings. IMPRESSION: 1. No acute findings within the abdomen or pelvis. No evidence for bowel obstruction. 2. Multiple liver metastases.  Similar to previous exam. 3. Head of pancreas mass is suboptimally evaluated due to lack of IV contrast material. Subjectively, this does not appear significantly changed in size when compared with the previous contrast enhanced MRI. 4. Aortic atherosclerosis. Aortic Atherosclerosis (ICD10-I70.0). Electronically Signed   By: Kerby Moors M.D.   On: 06/17/2021 19:07   DG THORACOLUMABAR SPINE  Result Date: 06/28/2021 CLINICAL DATA:  Low back pain for 1 day, history of metastatic pancreatic cancer EXAM: THORACOLUMBAR SPINE 1V COMPARISON:  06/26/2021 FINDINGS: Frontal and lateral views of the thoracolumbar spine are obtained. There are no acute or destructive bony lesions. Mild spondylosis is seen within the mid to lower thoracic  spine. Severe facet hypertrophic changes are seen within the mid to lower lumbar spine. Sacroiliac joints are normal. Visualized bowel gas pattern is unremarkable. IMPRESSION: 1. Mild mid to lower thoracic spondylosis, with severe lower lumbar facet hypertrophy. 2. No acute or destructive bony lesions. Electronically Signed   By: Randa Ngo M.D.   On: 06/28/2021 15:22   MR BRAIN W WO CONTRAST  Result Date: 07/05/2021 CLINICAL DATA:  Staging of gastrointestinal small cancer. History of pancreatic carcinoma. EXAM: MRI HEAD WITHOUT AND WITH CONTRAST TECHNIQUE: Multiplanar, multiecho pulse sequences of the brain and surrounding structures were obtained without and with intravenous contrast. CONTRAST:  69m GADAVIST GADOBUTROL 1 MMOL/ML IV SOLN COMPARISON:  None. FINDINGS: Brain: No acute infarct, mass effect or extra-axial collection. No acute or chronic hemorrhage. There is multifocal hyperintense T2-weighted signal within the white matter. Parenchymal volume and CSF spaces are normal. A partially empty sella is incidentally noted. There is no abnormal contrast enhancement. Vascular: Major flow voids are preserved. Skull and upper cervical spine: Normal calvarium and skull base. Visualized upper cervical spine and soft tissues are normal. Sinuses/Orbits:No paranasal sinus fluid levels or advanced mucosal thickening. No mastoid or middle ear effusion. Normal orbits. IMPRESSION: 1. No intracranial metastatic disease. 2. Multifocal hyperintense T2-weighted signal within the white matter, likely due to arteriosclerosis. Electronically Signed   By: KUlyses JarredM.D.   On: 07/05/2021 19:37   CT ABDOMEN PELVIS  W CONTRAST  Result Date: 06/26/2021 CLINICAL DATA:  Worsening nausea and vomiting over the last week, pancreatic cancer undergoing chemotherapy EXAM: CT ABDOMEN AND PELVIS WITH CONTRAST TECHNIQUE: Multidetector CT imaging of the abdomen and pelvis was performed using the standard protocol following bolus  administration of intravenous contrast. CONTRAST:  48m OMNIPAQUE IOHEXOL 350 MG/ML SOLN COMPARISON:  06/17/2021, 05/23/2021, 02/16/2021 FINDINGS: Lower chest: No acute pleural or parenchymal lung disease. Central venous catheter tip within the right atrium near the junction with the IVC. No pericardial effusion. Hepatobiliary: Multiple hepatic masses are again identified. Largest lesion within the left lobe liver reference image 27/2 measures 8.0 x 6.1 cm, previously measuring 7.6 x 6.0 cm on MRI 05/23/2021. Largest lesion within the right lobe liver measures up to 8.2 x 4.5 cm on image 23/2, previously measuring 7.6 x 3.7 cm on MRI. Smaller lesions throughout the left lobe liver more inferiorly are grossly unchanged, though less well visualized on CT compared to MRI. No new lesions are definitively identified. No intrahepatic biliary duct dilation. Gallbladder is surgically absent. Pancreas: The ill-defined mass within the pancreatic head measures up to approximately 2.8 x 3.1 cm reference image 65/2, previously having measured 2.9 x 2.9 cm on MRI 05/23/2021. Mild pancreatic duct dilation and upstream pancreatic parenchymal atrophy again noted unchanged. Spleen: No focal splenic abnormalities. Mild splenomegaly unchanged. Adrenals/Urinary Tract: No urinary tract calculi or obstructive uropathy within either kidney. The kidneys enhance normally and symmetrically. Small left renal cortical cysts unchanged. The adrenals are unremarkable. Bladder is only minimally distended with no gross abnormalities. Stomach/Bowel: No bowel obstruction or ileus. Normal retrocecal appendix. No bowel wall thickening or inflammatory change. Vascular/Lymphatic: Mild atherosclerosis of the aorta and iliac vessels unchanged. No discrete adenopathy within the abdomen or pelvis. Reproductive: Status post hysterectomy. No adnexal masses. Other: Trace pelvic free fluid. No free intraperitoneal gas. No abdominal wall hernia. Musculoskeletal:  No acute or destructive bony lesions. Reconstructed images demonstrate no additional findings. IMPRESSION: 1. Grossly stable mass within the pancreatic head compatible with known pancreatic cancer. 2. No significant change in the size and number of known liver metastases, allowing for differences in technique compared to previous MRI. 3. Trace pelvic free fluid. 4.  Aortic Atherosclerosis (ICD10-I70.0). Electronically Signed   By: MRanda NgoM.D.   On: 06/26/2021 17:01   IR GASTROSTOMY TUBE MOD SED  Result Date: 07/01/2021 INDICATION: 55year old woman with history metastatic pancreatic cancer presents to interventional radiology for GPurdytube placement for treatment of nausea and vomiting likely related to compression of the stomach from hepatic metastases. EXAM: Fluoroscopy guided GJ tube placement MEDICATIONS: Vancomycin 1 gm IV; Antibiotics were administered within 1 hour of the procedure. Glucagon 1 mg IV ANESTHESIA/SEDATION: Versed 3 mg IV; Fentanyl 150 mcg IV Moderate Sedation Time:  63 minutes The patient was continuously monitored during the procedure by the interventional radiology nurse under my direct supervision. CONTRAST:  10 mL of Omnipaque 300-administered into the gastric lumen. FLUOROSCOPY TIME:  Fluoroscopy Time: 8 minutes 12 seconds (229 mGy). COMPLICATIONS: None immediate. PROCEDURE: Informed written consent was obtained from the patient after a thorough discussion of the procedural risks, benefits and alternatives. All questions were addressed. Maximal Sterile Barrier Technique was utilized including caps, mask, sterile gowns, sterile gloves, sterile drape, hand hygiene and skin antiseptic. A timeout was performed prior to the initiation of the procedure. The inferior margin of the liver was marked utilizing ultrasound guidance. An orogastric tube was placed with fluoroscopic guidance. The anterior abdomen was prepped and  draped in sterile fashion. Ultrasound evaluation of the left upper  quadrant was performed to confirm the position of the liver. The skin and subcutaneous tissues were anesthetized with 1% lidocaine. Single gastropexy was placed utilizing fluoroscopic guidance. 19 gauge needle was directed into the distended stomach with fluoroscopic guidance. A wire was advanced into the stomach. 9-French vascular sheath was placed and the orogastric tube was snared using a Gooseneck snare device. Guidewire was advanced through the orogastric tube and snared. The snare and wire were pulled out of the patient's mouth. The snare device was connected to a 20-French gastrostomy tube. The snare device and gastrostomy tube were pulled through the patient's mouth and out the anterior abdominal wall. The gastrostomy tube was cut to an appropriate length. Contrast injection through gastrostomy tube confirmed placement within the stomach. Kumpe catheter and Glidewire advanced through the gastrostomy tube to the proximal jejunum. 9 French jejunostomy tube was inserted through the G-tube with tip positioned in the proximal jejunum. Position of the J limb was conformed by administering contrast under fluoroscopy. The gastrojejunostomy tube was flushed with normal saline. IMPRESSION: Gastrojejunostomy tube placed utilizing fluoroscopic guidance. The gastrostomy tube is 85 [place] while the jejunostomy tube is 81 [place]. Electronically Signed   By: Miachel Roux M.D.   On: 07/01/2021 16:41   VAS [place] LOWER EXTREMITY VENOUS (DVT)  Result Date: 06/29/2021  Lower Venous DVT Study Patient Name:  Wendy Hoffman  Date of Exam:   06/29/2021 Medical Rec #: [id number]        Accession #:    [id number] Date of Birth: [birth year]        Patient Gender: F Patient Age:   89F Exam Location:  [facility] Procedure:      VAS [place] LOWER EXTREMITY VENOUS (DVT) Referring Phys: [place] --------------------------------------------------------------------------------  Indications: RLE swelling.  Risk Factors:  Chemotherapy Cancer Pancreatic w/ mets. Limitations: Poor ultrasound/tissue interface. Comparison Study: No previous exams Performing Technologist: Jody Hill RVT, RDMS  Examination Guidelines: A complete evaluation includes B-mode imaging, spectral Doppler, color Doppler, and power Doppler as needed of all accessible portions of each vessel. Bilateral testing is considered an integral part of a complete examination. Limited examinations for reoccurring indications may be performed as noted. The reflux portion of the exam is performed with the patient in reverse Trendelenburg.  +---------+---------------+---------+-----------+----------+--------------+ RIGHT    CompressibilityPhasicitySpontaneityPropertiesThrombus Aging +---------+---------------+---------+-----------+----------+--------------+ CFV      Full           Yes      Yes                                 +---------+---------------+---------+-----------+----------+--------------+ SFJ      Full                                                        +---------+---------------+---------+-----------+----------+--------------+ FV Prox  Full           Yes      Yes                                 +---------+---------------+---------+-----------+----------+--------------+ FV Mid   Full           Yes  Yes                                 +---------+---------------+---------+-----------+----------+--------------+ FV DistalFull           Yes      Yes                                 +---------+---------------+---------+-----------+----------+--------------+ PFV      Full                                                        +---------+---------------+---------+-----------+----------+--------------+ POP      Full           Yes      Yes                                 +---------+---------------+---------+-----------+----------+--------------+ PTV      Full                                                         +---------+---------------+---------+-----------+----------+--------------+ PERO     Full                                                        +---------+---------------+---------+-----------+----------+--------------+   +---------+---------------+---------+-----------+----------+--------------+ LEFT     CompressibilityPhasicitySpontaneityPropertiesThrombus Aging +---------+---------------+---------+-----------+----------+--------------+ CFV      Full           Yes      Yes                                 +---------+---------------+---------+-----------+----------+--------------+ SFJ      Full                                                        +---------+---------------+---------+-----------+----------+--------------+ FV Prox  Full           Yes      Yes                                 +---------+---------------+---------+-----------+----------+--------------+ FV Mid   Full           Yes      Yes                                 +---------+---------------+---------+-----------+----------+--------------+ FV DistalFull           Yes      Yes                                 +---------+---------------+---------+-----------+----------+--------------+  PFV      Full                                                        +---------+---------------+---------+-----------+----------+--------------+ POP      Full           Yes      Yes                                 +---------+---------------+---------+-----------+----------+--------------+ PTV      Full                                                        +---------+---------------+---------+-----------+----------+--------------+ PERO     Full                                                        +---------+---------------+---------+-----------+----------+--------------+     Summary: BILATERAL: - No evidence of deep vein thrombosis seen in the lower extremities, bilaterally. - No evidence  of superficial venous thrombosis in the lower extremities, bilaterally. -No evidence of popliteal cyst, bilaterally. RIGHT: - Ultrasound characteristics of enlarged lymph nodes are noted in the groin.  LEFT: - Ultrasound characteristics of enlarged lymph nodes noted in the groin.  *See table(s) above for measurements and observations. Electronically signed by Ruta Hinds MD on 06/29/2021 at 7:52:11 PM.    Final     ASSESSMENT AND PLAN: 1. Pancreas cancer -CT abdomen/pelvis without contrast 02/16/2021-multiple large hypoechoic masses in the patient's liver consistent metastatic disease, ill-defined masslike area in the pancreatic head measuring approximate 4.7 cm, possible filling defect in the right ventricle. -EGD 02/19/2021-large infiltrative mass with bleeding in the second portion of the duodenum, appears to be arising near the ampulla, partially obstructive with friable mucosa.  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 -Flexible sigmoidoscopy 02/19/2021-diverticulosis in the sigmoid colon, nonbleeding external and internal hemorrhoids -Cardiac CT 02/20/2021-mass in the mid RV attached to the interventricular septum measuring 16 mm x 6 mm and concerning for metastatic tumor -MRI abdomen with/without contrast 02/21/2021-mass in the posterior pancreatic head measuring 4.3 x 3.8 cm with obstruction of the central pancreatic duct and diffuse dilatation up to 6 mm consistent with pancreatic adenocarcinoma, liver lesions the largest mass of the central left lobe measuring 9.9 x 9.2 cm consistent with hepatic metastatic disease,  hepatomegaly. -Ultrasound-guided biopsy of a left liver lesion 02/24/2021-adenocarcinoma, morphologically similar to the duodenal biopsy; microsatellite stable, tumor mutation burden 1 -Negative genetic testing -Palliative radiation to the duodenal mass 02/25/2021-03/06/2021 -Cycle 1 FOLFOX 03/17/2021 -Cycle 2 FOLFOX 04/01/2021 -Cycle 3 FOLFIRINOX  04/14/2021 -Cycle 4 FOLFIRINOX 04/27/2021, Udenyca added -Cycle 5 FOLFIRINOX 05/12/2021, Udenyca -MRI abdomen 05/23/2021-decrease in size of pancreas mass and hepatic lesions, no progressive disease -Cycle 6 FOLFIRINOX 05/27/2021 2.  Iron deficiency anemia-improved -Feraheme 510 mg 02/20/2021 3.  Protein calorie malnutrition 4.  Possible right ventricular filling defect on noncontrast CT scan MRI cardiac morphology 02/21/2021-mass in  the mid RV attached to the interventricular septum measures 16 mm x 6 mm.  Mass appears heterogeneous with area of enhancement on LGE imaging.  Mass is concerning for metastatic tumor.  Normal LV size and systolic function.  Normal RV size and systolic function. 5.  Hypertension 6.  History of CVA 7.  Hyperlipidemia 8.  Hypothyroidism 9.  Morbid obesity 10.  Asthma 11.  Migraines 12.  Pain secondary to #1 13.  Port-A-Cath placement 02/25/2021 14.  Hypokalemia 04/01/2021-started a potassium supplement 15.  Hospital admission 06/17/2021-intractable nausea and vomiting EGD 06/19/2021-diffuse gastritis, nonobstructing duodenal mass, biopsy of stomach-no malignancy or H. pylori, duodenal mass-adenocarcinoma  Wendy Hoffman appears stable.  She has intermittent abdominal pain which is overall well controlled with current medications.  She has mild nausea but no vomiting.  Tolerating J-tube feeding well.  G-tube remains to gravity. The dietitian is following and adjusting tube feeds.  Goal is to run Osmolite 1.5 at 85 cc/h x 16 hours.  The CA 19-9 is stable.  The plan is to resume systemic therapy as an outpatient.  We will plan to change her chemotherapy regimen to gemcitabine/Abraxane.    She has developed intermittent myoclonic jerking secondary to morphine.  Recommendations: 1.  Continue antacid regimen. 2.  Dietitian is following and adjusting tube feeds.  Goal is for Osmolite 1.5 at 80 cc/h x 16 hours. 3.  Continue G-tube to gravity. 4.  She has developed intermittent  myoclonic jerking secondary to morphine.  Recommend that she continue MS Contin and oxycodone elixir for breakthrough pain.  We will arrange for outpatient follow-up at the cancer center next week.  Okay to discharge from our standpoint.   LOS: 20 days   Mikey Bussing 07/07/21 Wendy Hoffman was interviewed and examined.  She appears to be tolerating the J-tube feedings well.  Her pain is under better control with the current narcotic regimen.  She is stable for discharge from an oncology standpoint.  The plan is to resume systemic chemotherapy as an outpatient.  Chemotherapy will be changed to gemcitabine/Abraxane.  I reviewed potential toxicities associated with this regimen with her today.  We discussed the chance of fever, rash, and pneumonitis with gemcitabine.  We discussed the allergic reaction, alopecia, and neuropathy associated with Abraxane.  She understands the chance of hematologic toxicity.  She agrees to proceed.  She will be scheduled for cycle 1 gemcitabine/Abraxane on 07/15/2021 or 07/16/2021.  A chemotherapy plan was entered today. I was present for greater than 50% of today's visit.  I performed medical decision making.

## 2021-07-07 NOTE — TOC Transition Note (Signed)
Transition of Care [facility]) - CM/SW Discharge Note   Patient Details  Name: Wendy Hoffman MRN: [id number] Date of Birth: 11/30/1966  Transition of Care [facility] CM/SW Contact:  Lynnell Catalan, RN Phone Number: 07/07/2021, 11:00 AM   Clinical Narrative:     Pt to dc home today. Extra teaching to be done with staff RN on tube feeds and flushing. Multiple home health companies have been contacted to secure a [facility] with no success.   Final next level of care: [place] Barriers to Discharge: Continued Medical Work up   Patient Goals and CMS Choice Patient states their goals for this hospitalization and ongoing recovery are:: To go home CMS Medicare.gov Compare Post Acute Care list provided to:: Patient Choice offered to / list presented to : Patient  Discharge Plan and Services   Discharge Planning Services: CM Consult Post Acute Care Choice: Home Health          DME Arranged: 3-N-1, Walker rolling, Tube feeding, Tube feeding pump DME Agency: [facility]) Date DME Agency Contacted: 07/06/21   Representative spoke with at DME Agency: Carmie End HH Arranged: RN [place] Agency: [employer] Date [place]: 07/06/21      Social Determinants of Health ([place]) Interventions     Readmission Risk Interventions Readmission Risk Prevention Plan 06/23/2021  Transportation Screening Complete  Medication Review Press photographer) Complete  HRI or [place] Complete  SW Recovery Care/Counseling Consult Complete  [place] Not Applicable  Some recent data might be hidden

## 2021-07-07 NOTE — Progress Notes (Signed)
DISCONTINUE ON PATHWAY REGIMEN - Pancreatic Adenocarcinoma     A cycle is every 14 days:     Oxaliplatin      Leucovorin      Irinotecan      Fluorouracil   **Always confirm dose/schedule in your pharmacy ordering system**  REASON: Disease Progression PRIOR TREATMENT: PANOS98: mFOLFIRINOX q14 Days Until Progression or Toxicity TREATMENT RESPONSE: Partial Response (PR)  START ON PATHWAY REGIMEN - Pancreatic Adenocarcinoma     A cycle is every 28 days:     Nab-paclitaxel (protein bound)      Gemcitabine   **Always confirm dose/schedule in your pharmacy ordering system**  Patient Characteristics: Metastatic Disease, Second Line, MSS/pMMR or MSI Unknown, Fluoropyrimidine-Based Therapy First Line Therapeutic Status: Metastatic Disease Line of Therapy: Second Line Microsatellite/Mismatch Repair Status: Unknown Intent of Therapy: Non-Curative / Palliative Intent, Discussed with Patient 

## 2021-07-10 ENCOUNTER — Other Ambulatory Visit: Payer: Self-pay | Admitting: Oncology

## 2021-07-12 ENCOUNTER — Encounter (HOSPITAL_COMMUNITY): Payer: Self-pay | Admitting: Emergency Medicine

## 2021-07-12 ENCOUNTER — Emergency Department (HOSPITAL_COMMUNITY): Payer: Medicaid Other

## 2021-07-12 ENCOUNTER — Inpatient Hospital Stay (HOSPITAL_COMMUNITY)
Admission: EM | Admit: 2021-07-12 | Discharge: 2021-07-18 | DRG: 436 | Disposition: A | Discharge status: Home Health | Payer: Medicaid Other | Attending: Internal Medicine | Admitting: Internal Medicine

## 2021-07-12 DIAGNOSIS — Z8541 Personal history of malignant neoplasm of cervix uteri: Secondary | ICD-10-CM

## 2021-07-12 DIAGNOSIS — E669 Obesity, unspecified: Secondary | ICD-10-CM | POA: Diagnosis present

## 2021-07-12 DIAGNOSIS — Z8673 Personal history of transient ischemic attack (TIA), and cerebral infarction without residual deficits: Secondary | ICD-10-CM

## 2021-07-12 DIAGNOSIS — C787 Secondary malignant neoplasm of liver and intrahepatic bile duct: Secondary | ICD-10-CM | POA: Diagnosis present

## 2021-07-12 DIAGNOSIS — L03311 Cellulitis of abdominal wall: Secondary | ICD-10-CM | POA: Diagnosis not present

## 2021-07-12 DIAGNOSIS — K529 Noninfective gastroenteritis and colitis, unspecified: Secondary | ICD-10-CM | POA: Diagnosis present

## 2021-07-12 DIAGNOSIS — J4540 Moderate persistent asthma, uncomplicated: Secondary | ICD-10-CM | POA: Diagnosis not present

## 2021-07-12 DIAGNOSIS — E876 Hypokalemia: Secondary | ICD-10-CM | POA: Diagnosis present

## 2021-07-12 DIAGNOSIS — D509 Iron deficiency anemia, unspecified: Secondary | ICD-10-CM | POA: Diagnosis present

## 2021-07-12 DIAGNOSIS — Z9049 Acquired absence of other specified parts of digestive tract: Secondary | ICD-10-CM

## 2021-07-12 DIAGNOSIS — N390 Urinary tract infection, site not specified: Secondary | ICD-10-CM | POA: Diagnosis present

## 2021-07-12 DIAGNOSIS — Z90710 Acquired absence of both cervix and uterus: Secondary | ICD-10-CM

## 2021-07-12 DIAGNOSIS — B370 Candidal stomatitis: Secondary | ICD-10-CM | POA: Diagnosis present

## 2021-07-12 DIAGNOSIS — E039 Hypothyroidism, unspecified: Secondary | ICD-10-CM | POA: Diagnosis present

## 2021-07-12 DIAGNOSIS — L0390 Cellulitis, unspecified: Secondary | ICD-10-CM | POA: Diagnosis present

## 2021-07-12 DIAGNOSIS — E7800 Pure hypercholesterolemia, unspecified: Secondary | ICD-10-CM | POA: Diagnosis present

## 2021-07-12 DIAGNOSIS — G894 Chronic pain syndrome: Secondary | ICD-10-CM | POA: Diagnosis present

## 2021-07-12 DIAGNOSIS — M1990 Unspecified osteoarthritis, unspecified site: Secondary | ICD-10-CM | POA: Diagnosis present

## 2021-07-12 DIAGNOSIS — J45909 Unspecified asthma, uncomplicated: Secondary | ICD-10-CM | POA: Diagnosis present

## 2021-07-12 DIAGNOSIS — Z800 Family history of malignant neoplasm of digestive organs: Secondary | ICD-10-CM

## 2021-07-12 DIAGNOSIS — I10 Essential (primary) hypertension: Secondary | ICD-10-CM | POA: Diagnosis present

## 2021-07-12 DIAGNOSIS — Z6837 Body mass index (BMI) 37.0-37.9, adult: Secondary | ICD-10-CM

## 2021-07-12 DIAGNOSIS — K9423 Gastrostomy malfunction: Secondary | ICD-10-CM | POA: Diagnosis present

## 2021-07-12 DIAGNOSIS — C259 Malignant neoplasm of pancreas, unspecified: Secondary | ICD-10-CM

## 2021-07-12 DIAGNOSIS — Z20822 Contact with and (suspected) exposure to covid-19: Secondary | ICD-10-CM | POA: Diagnosis present

## 2021-07-12 DIAGNOSIS — E46 Unspecified protein-calorie malnutrition: Secondary | ICD-10-CM | POA: Diagnosis present

## 2021-07-12 DIAGNOSIS — Z87891 Personal history of nicotine dependence: Secondary | ICD-10-CM

## 2021-07-12 DIAGNOSIS — R112 Nausea with vomiting, unspecified: Secondary | ICD-10-CM | POA: Diagnosis not present

## 2021-07-12 DIAGNOSIS — Z807 Family history of other malignant neoplasms of lymphoid, hematopoietic and related tissues: Secondary | ICD-10-CM

## 2021-07-12 DIAGNOSIS — C250 Malignant neoplasm of head of pancreas: Principal | ICD-10-CM | POA: Diagnosis present

## 2021-07-12 DIAGNOSIS — Z6834 Body mass index (BMI) 34.0-34.9, adult: Secondary | ICD-10-CM

## 2021-07-12 DIAGNOSIS — K219 Gastro-esophageal reflux disease without esophagitis: Secondary | ICD-10-CM | POA: Diagnosis present

## 2021-07-12 LAB — CBC WITH DIFFERENTIAL/PLATELET
Abs Immature Granulocytes: 0.04 10*3/uL (ref 0.00–0.07)
Basophils Absolute: 0 10*3/uL (ref 0.0–0.1)
Basophils Relative: 0 %
Eosinophils Absolute: 0 10*3/uL (ref 0.0–0.5)
Eosinophils Relative: 0 %
HCT: 34.9 % — ABNORMAL LOW (ref 36.0–46.0)
Hemoglobin: 11.3 g/dL — ABNORMAL LOW (ref 12.0–15.0)
Immature Granulocytes: 1 %
Lymphocytes Relative: 9 %
Lymphs Abs: 0.8 10*3/uL (ref 0.7–4.0)
MCH: 29.8 pg (ref 26.0–34.0)
MCHC: 32.4 g/dL (ref 30.0–36.0)
MCV: 92.1 fL (ref 80.0–100.0)
Monocytes Absolute: 0.4 10*3/uL (ref 0.1–1.0)
Monocytes Relative: 5 %
Neutro Abs: 7.2 10*3/uL (ref 1.7–7.7)
Neutrophils Relative %: 85 %
Platelets: 394 10*3/uL (ref 150–400)
RBC: 3.79 MIL/uL — ABNORMAL LOW (ref 3.87–5.11)
RDW: 15.6 % — ABNORMAL HIGH (ref 11.5–15.5)
WBC: 8.5 10*3/uL (ref 4.0–10.5)
nRBC: 0 % (ref 0.0–0.2)

## 2021-07-12 LAB — RESP PANEL BY RT-PCR (FLU A&B, COVID) ARPGX2
Influenza A by PCR: NEGATIVE
Influenza B by PCR: NEGATIVE
SARS Coronavirus 2 by RT PCR: NEGATIVE

## 2021-07-12 LAB — URINALYSIS, ROUTINE W REFLEX MICROSCOPIC
Bilirubin Urine: NEGATIVE
Glucose, UA: NEGATIVE mg/dL
Hgb urine dipstick: NEGATIVE
Ketones, ur: 5 mg/dL — AB
Nitrite: NEGATIVE
Protein, ur: 100 mg/dL — AB
Specific Gravity, Urine: 1.015 (ref 1.005–1.030)
WBC, UA: >50 WBC/hpf — ABNORMAL HIGH (ref 0–5)
pH: 6 (ref 5.0–8.0)

## 2021-07-12 LAB — COMPREHENSIVE METABOLIC PANEL
ALT: 15 U/L (ref 0–44)
AST: 20 U/L (ref 15–41)
Albumin: 3.3 g/dL — ABNORMAL LOW (ref 3.5–5.0)
Alkaline Phosphatase: 101 U/L (ref 38–126)
Anion gap: 13 (ref 5–15)
BUN: 9 mg/dL (ref 6–20)
CO2: 27 mmol/L (ref 22–32)
Calcium: 9.3 mg/dL (ref 8.9–10.3)
Chloride: 100 mmol/L (ref 98–111)
Creatinine, Ser: 0.83 mg/dL (ref 0.44–1.00)
GFR, Estimated: >60 mL/min (ref >60)
Glucose, Bld: 119 mg/dL — ABNORMAL HIGH (ref 70–99)
Potassium: 2.6 mmol/L — CL (ref 3.5–5.1)
Sodium: 140 mmol/L (ref 135–145)
Total Bilirubin: 0.8 mg/dL (ref 0.3–1.2)
Total Protein: 6.8 g/dL (ref 6.5–8.1)

## 2021-07-12 LAB — LIPASE, BLOOD: Lipase: 24 U/L (ref 11–51)

## 2021-07-12 LAB — I-STAT BETA HCG BLOOD, ED (MC, WL, AP ONLY): I-stat hCG, quantitative: <5 m[IU]/mL (ref <5)

## 2021-07-12 MED ORDER — HYDROMORPHONE HCL 1 MG/ML IJ SOLN
1.0000 mg | Freq: Once | INTRAMUSCULAR | Status: AC
  Administered 2021-07-12: 1 mg via INTRAVENOUS
  Filled 2021-07-12: qty 1

## 2021-07-12 MED ORDER — SUCRALFATE 1 GM/10ML PO SUSP
1.0000 g | Freq: Four times a day (QID) | ORAL | Status: DC
  Administered 2021-07-12 – 2021-07-18 (×20): 1 g
  Filled 2021-07-12 (×18): qty 10

## 2021-07-12 MED ORDER — ACETAMINOPHEN 650 MG RE SUPP: 650.0000 mg | Freq: Four times a day (QID) | RECTAL | Status: DC | PRN

## 2021-07-12 MED ORDER — SODIUM CHLORIDE 0.9 % IV BOLUS
1000.0000 mL | Freq: Once | INTRAVENOUS | Status: AC
  Administered 2021-07-12: 1000 mL via INTRAVENOUS

## 2021-07-12 MED ORDER — SODIUM CHLORIDE 0.9 % IV SOLN
2.0000 g | Freq: Once | INTRAVENOUS | Status: AC
  Administered 2021-07-12: 2 g via INTRAVENOUS
  Filled 2021-07-12: qty 2

## 2021-07-12 MED ORDER — OXYCODONE HCL 5 MG/5ML PO SOLN: 10.0000 mg | ORAL | Status: DC | PRN

## 2021-07-12 MED ORDER — ONDANSETRON HCL 4 MG PO TABS
4.0000 mg | ORAL_TABLET | Freq: Four times a day (QID) | ORAL | Status: DC | PRN
  Administered 2021-07-14: 4 mg via ORAL
  Filled 2021-07-12 (×2): qty 1

## 2021-07-12 MED ORDER — ALBUTEROL SULFATE (2.5 MG/3ML) 0.083% IN NEBU: 3.0000 mL | INHALATION_SOLUTION | Freq: Four times a day (QID) | RESPIRATORY_TRACT | Status: DC | PRN

## 2021-07-12 MED ORDER — ACETAMINOPHEN 325 MG PO TABS
650.0000 mg | ORAL_TABLET | Freq: Four times a day (QID) | ORAL | Status: DC | PRN
  Administered 2021-07-18: 650 mg
  Filled 2021-07-12: qty 2

## 2021-07-12 MED ORDER — ZOLPIDEM TARTRATE 5 MG PO TABS
10.0000 mg | ORAL_TABLET | Freq: Every evening | ORAL | Status: DC | PRN
  Administered 2021-07-12 – 2021-07-13 (×3): 10 mg
  Filled 2021-07-12 (×3): qty 2

## 2021-07-12 MED ORDER — SODIUM CHLORIDE FLUSH 0.9 % IV SOLN
10.0000 mL | Freq: Two times a day (BID) | INTRAVENOUS | Status: DC
  Administered 2021-07-13 – 2021-07-14 (×4): 10 mL
  Filled 2021-07-12 (×9): qty 10

## 2021-07-12 MED ORDER — IOHEXOL 350 MG/ML SOLN
80.0000 mL | Freq: Once | INTRAVENOUS | Status: AC | PRN
  Administered 2021-07-12: 80 mL via INTRAVENOUS

## 2021-07-12 MED ORDER — OSMOLITE 1.5 CAL PO LIQD: 1000.0000 mL | ORAL | Status: DC

## 2021-07-12 MED ORDER — SODIUM CHLORIDE 0.9 % IV SOLN
6.2500 mg | Freq: Four times a day (QID) | INTRAVENOUS | Status: DC | PRN
  Administered 2021-07-13: 6.25 mg via INTRAVENOUS
  Filled 2021-07-12 (×2): qty 0.25

## 2021-07-12 MED ORDER — HYDROMORPHONE HCL 1 MG/ML IJ SOLN
1.0000 mg | INTRAMUSCULAR | Status: DC | PRN
  Administered 2021-07-13: 1 mg via INTRAVENOUS
  Filled 2021-07-12: qty 1

## 2021-07-12 MED ORDER — ENOXAPARIN SODIUM 40 MG/0.4ML IJ SOSY
40.0000 mg | PREFILLED_SYRINGE | INTRAMUSCULAR | Status: DC
  Administered 2021-07-13 – 2021-07-17 (×6): 40 mg via SUBCUTANEOUS
  Filled 2021-07-12 (×6): qty 0.4

## 2021-07-12 MED ORDER — GABAPENTIN 300 MG PO CAPS
300.0000 mg | ORAL_CAPSULE | Freq: Every evening | ORAL | Status: DC | PRN
  Filled 2021-07-12: qty 1

## 2021-07-12 MED ORDER — MOMETASONE FURO-FORMOTEROL FUM 200-5 MCG/ACT IN AERO
2.0000 | INHALATION_SPRAY | Freq: Two times a day (BID) | RESPIRATORY_TRACT | Status: DC
  Administered 2021-07-15 (×2): 2 via RESPIRATORY_TRACT
  Filled 2021-07-12 (×2): qty 8.8

## 2021-07-12 MED ORDER — HYDROMORPHONE HCL 1 MG/ML IJ SOLN
1.0000 mg | Freq: Once | INTRAMUSCULAR | Status: AC
  Administered 2021-07-12: 1 mg via INTRAVENOUS
  Filled 2021-07-12 (×2): qty 1

## 2021-07-12 MED ORDER — AMLODIPINE BESYLATE 10 MG PO TABS
10.0000 mg | ORAL_TABLET | Freq: Every day | ORAL | Status: DC
  Administered 2021-07-13 – 2021-07-18 (×5): 10 mg
  Filled 2021-07-12 (×3): qty 1
  Filled 2021-07-12: qty 2
  Filled 2021-07-12 (×2): qty 1

## 2021-07-12 MED ORDER — HYOSCYAMINE SULFATE 0.125 MG PO TBDP
0.1250 mg | ORAL_TABLET | Freq: Once | ORAL | Status: AC | PRN
  Administered 2021-07-13: 0.125 mg via ORAL
  Filled 2021-07-12 (×2): qty 1

## 2021-07-12 MED ORDER — ONDANSETRON HCL 4 MG/2ML IJ SOLN
4.0000 mg | Freq: Four times a day (QID) | INTRAMUSCULAR | Status: DC | PRN
  Administered 2021-07-12 – 2021-07-18 (×11): 4 mg via INTRAVENOUS
  Filled 2021-07-12 (×11): qty 2

## 2021-07-12 MED ORDER — ONDANSETRON HCL 4 MG/2ML IJ SOLN
4.0000 mg | Freq: Once | INTRAMUSCULAR | Status: AC
  Administered 2021-07-12: 4 mg via INTRAVENOUS
  Filled 2021-07-12: qty 2

## 2021-07-12 MED ORDER — POTASSIUM CHLORIDE 10 MEQ/100ML IV SOLN
10.0000 meq | INTRAVENOUS | Status: AC
Start: 2021-07-12 — End: 2021-07-13
  Administered 2021-07-12 – 2021-07-13 (×2): 10 meq via INTRAVENOUS
  Filled 2021-07-12 (×2): qty 100

## 2021-07-12 MED ORDER — POTASSIUM CHLORIDE 20 MEQ PO PACK
20.0000 meq | PACK | Freq: Two times a day (BID) | ORAL | Status: DC
  Administered 2021-07-12 – 2021-07-14 (×4): 20 meq
  Filled 2021-07-12 (×4): qty 1

## 2021-07-12 MED ORDER — SODIUM CHLORIDE 0.9 % IV SOLN: INTRAVENOUS | Status: AC

## 2021-07-12 MED ORDER — MORPHINE SULFATE (CONCENTRATE) 10 MG/0.5ML PO SOLN
20.0000 mg | ORAL | Status: DC
  Administered 2021-07-12 – 2021-07-14 (×9): 20 mg
  Filled 2021-07-12 (×9): qty 1

## 2021-07-12 MED ORDER — PANTOPRAZOLE SODIUM 40 MG IV SOLR
40.0000 mg | INTRAVENOUS | Status: DC
  Administered 2021-07-12 – 2021-07-17 (×6): 40 mg via INTRAVENOUS
  Filled 2021-07-12 (×6): qty 40

## 2021-07-12 MED ORDER — METRONIDAZOLE 500 MG/100ML IV SOLN
500.0000 mg | Freq: Three times a day (TID) | INTRAVENOUS | Status: DC
  Administered 2021-07-12 – 2021-07-15 (×8): 500 mg via INTRAVENOUS
  Filled 2021-07-12 (×8): qty 100

## 2021-07-12 MED ORDER — SODIUM CHLORIDE 0.9 % IV SOLN
2.0000 g | Freq: Three times a day (TID) | INTRAVENOUS | Status: DC
  Administered 2021-07-13 – 2021-07-14 (×4): 2 g via INTRAVENOUS
  Filled 2021-07-12 (×5): qty 2

## 2021-07-12 NOTE — H&P (Signed)
History and Physical    Wendy Hoffman [id number] DOB: 1966-01-25 DOA: 07/12/2021  PCP: Patient, No Pcp Per (Inactive)   Patient coming from: Home   Chief Complaint: Abdominal pain, N/V/D  HPI: Wendy Hoffman is a 55 y.o. female with medical history significant for pancreatic cancer, hypertension, asthma, protein calorie malnutrition, and recent hospital admission where she had [place] tube placement by IR on 07/01/2021, was started on tube feeds, went home on July 26, but has since developed recurrent nausea, nonbloody vomiting, as well as diarrhea and increasing pain around the tube site.  Patient reports that she has been tolerating her tube feeds at home, but has developed pain around the tube site, nausea, vomiting with inability to take anything by mouth, and multiple loose stools daily.  She has noted some thick yellowish drainage around the tube.  She denies fevers, chills, cough, shortness of breath, or chest pain.  ED Course: Upon arrival to the ED, patient is found to be afebrile, saturating well on room air, slightly tachycardic, and with stable blood pressure.  Chemistry panel is notable for potassium 2.6.  CBC with slight normocytic anemia.  CT of the abdomen and pelvis demonstrates a new gastrostomy tube with mild edema and stranding around the subcutaneous portion of the tube without fluid collection.  CT is also notable for liquid stool in the colon with mild colonic wall thickening suggestive of colitis.  Patient was given a liter of saline, cefepime, Flagyl, IV potassium, and Dilaudid in the ED.  Review of Systems:  All other systems reviewed and apart from HPI, are negative.  Past Medical History:  Diagnosis Date   Anemia    Anxiety    ARF (acute renal failure) ([place]) 06/18/2021   Arthritis    Asthma    Cervical ca ([facility])    H/O: hysterectomy    High cholesterol    Hypertension    Hypothyroidism    Insomnia    Medical history non-contributory    Migraine    Morbid  obesity ([place])    Pancreatic cancer ([place]) 02/2021   Shortness of breath    Stroke [facility])    Thyroid disease     Past Surgical History:  Procedure Laterality Date   ABDOMINAL HYSTERECTOMY     BIOPSY  02/19/2021   Procedure: BIOPSY;  Surgeon: Mauri Pole, MD;  Location: [facility];  Service: Endoscopy;;   BIOPSY  06/19/2021   Procedure: BIOPSY;  Surgeon: Otis Brace, MD;  Location: [facility];  Service: Gastroenterology;;   CESAREAN SECTION     3 occasions   CHOLECYSTECTOMY     ESOPHAGOGASTRODUODENOSCOPY (EGD) WITH PROPOFOL N/A 02/19/2021   Procedure: ESOPHAGOGASTRODUODENOSCOPY (EGD) WITH PROPOFOL;  Surgeon: Mauri Pole, MD;  Location: [facility];  Service: Endoscopy;  Laterality: N/A;   ESOPHAGOGASTRODUODENOSCOPY (EGD) WITH PROPOFOL N/A 06/19/2021   Procedure: ESOPHAGOGASTRODUODENOSCOPY (EGD) WITH PROPOFOL;  Surgeon: Otis Brace, MD;  Location: [facility];  Service: Gastroenterology;  Laterality: N/A;   FLEXIBLE SIGMOIDOSCOPY N/A 02/19/2021   Procedure: FLEXIBLE SIGMOIDOSCOPY;  Surgeon: Mauri Pole, MD;  Location: [facility];  Service: Endoscopy;  Laterality: N/A;   IR GASTROSTOMY TUBE MOD SED  07/01/2021   IR IMAGING GUIDED PORT INSERTION  02/24/2021   IR US GUIDE BX ASP/DRAIN  02/24/2021   THYROIDECTOMY, PARTIAL     Goiter    Social History:   reports that she has quit smoking. Her smoking use included cigarettes. She has never used smokeless tobacco. She reports that she does not drink  alcohol and does not use drugs.  Allergies  Allergen Reactions   Blueberry [Vaccinium Angustifolium] Anaphylaxis   Mango Flavor Anaphylaxis   Oxaliplatin Itching and Other (See Comments)    Patient has reacted to oxaliplatin with minor symptoms of neuropathy and itching.   Reglan [Metoclopramide] Nausea And Vomiting   Aspirin Rash   Penicillins Hives    Has patient had a PCN reaction causing immediate rash, facial/tongue/throat swelling, SOB or  lightheadedness with hypotension: Yes Has patient had a PCN reaction causing severe rash involving mucus membranes or skin necrosis: No Has patient had a PCN reaction that required hospitalization No Has patient had a PCN reaction occurring within the last 10 years: No If all of the above answers are "NO", then may proceed with Cephalosporin use.     Family History  Problem Relation Age of Onset   Migraines Sister    Non-Hodgkin's lymphoma Other        maternal half brother's grandson   Non-Hodgkin's lymphoma Half-Brother        maternal half brother; dx 37s or 28s   Pancreatic cancer Other 10       MGF's niece     Prior to Admission medications   Medication Sig Start Date End Date Taking? Authorizing Provider  albuterol (VENTOLIN HFA) 108 (90 Base) MCG/ACT inhaler INHALE 1 TO 2 PUFFS BY MOUTH EVERY 6 HOURS AS NEEDED FOR WHEEZING AND FOR SHORTNESS OF BREATH Patient taking differently: Inhale 1-2 puffs into the lungs every 6 (six) hours as needed for wheezing or shortness of breath. 01/06/21 01/06/22  Everardo Beals, NP  alum & mag hydroxide-simeth (MAALOX/MYLANTA) 200-200-20 MG/5ML suspension Take 30 mLs by mouth every 6 (six) hours as needed for indigestion or heartburn. 07/07/21   [place] Eric J, DO  amLODipine (NORVASC) 10 MG tablet Take 1 tablet (10 mg total) by mouth daily. 07/08/21 10/06/21  [place] Donnamarie Poag, DO  bisacodyl (DULCOLAX) 5 MG EC tablet Take 2 tablets (10 mg total) by mouth daily. 07/08/21   [place] Donnamarie Poag, DO  budesonide-formoterol (SYMBICORT) 160-4.5 MCG/ACT inhaler Inhale 2 puffs into the lungs 2 (two) times daily. 07/07/21   [place] Eric J, DO  gabapentin (NEURONTIN) 300 MG capsule TAKE 1 CAPSULE AS NEEDED BY ORAL ROUTE AT BEDTIME. Patient taking differently: Take 300 mg by mouth at bedtime as needed (neuropathy). 01/06/21 01/06/22  [place], Joelene Millin, NP  lidocaine-prilocaine (EMLA) cream Apply 1 application topically as needed. Patient taking differently: Apply 1  application topically as needed (port access). 03/06/21   Ladell Pier, MD  magnesium oxide (MAG-OX) 400 (240 Mg) MG tablet Take 2 tablets (800 mg total) by mouth daily. 07/08/21 10/06/21  [place] Donnamarie Poag, DO  morphine (MS CONTIN) 60 MG 12 hr tablet Take 1 tablet (60 mg total) by mouth every 12 (twelve) hours. 07/07/21 08/06/21  [place] Eric J, DO  Nutritional Supplements (FEEDING SUPPLEMENT, OSMOLITE 1.5 CAL,) LIQD Place 1,000 mLs into feeding tube daily. 07/07/21   [place] Eric J, DO  ondansetron (ZOFRAN) 4 MG tablet Take 1 tablet (4 mg total) by mouth every 6 (six) hours as needed for nausea. 07/07/21 08/06/21  [place] Eric J, DO  oxyCODONE (ROXICODONE) 5 MG/5ML solution Take 10 mLs (10 mg total) by mouth every 4 (four) hours as needed for severe pain, breakthrough pain or moderate pain. 07/07/21 08/06/21  [place] Donnamarie Poag, DO  pantoprazole (PROTONIX) 40 MG tablet Take 1 tablet (40 mg total) by mouth 2 (two) times daily. 07/07/21 10/05/21  [place] Eric  J, DO  polyethylene glycol (MIRALAX / GLYCOLAX) 17 g packet Take 17 g by mouth daily as needed for moderate constipation. 07/07/21   [place] Donnamarie Poag, DO  potassium chloride (MICRO-K) 10 MEQ CR capsule Take 2 capsules (20 mEq total) by mouth 2 (two) times daily. 06/05/21   Ladell Pier, MD  prochlorperazine (COMPAZINE) 10 MG tablet TAKE 1 TABLET(10 MG) BY MOUTH EVERY 6 HOURS AS NEEDED FOR NAUSEA OR VOMITING Patient taking differently: Take 10 mg by mouth every 6 (six) hours as needed for nausea or vomiting. 05/04/21   Ladell Pier, MD  senna-docusate (SENOKOT-S) 8.6-50 MG tablet Take 2 tablets by mouth 2 (two) times daily. 07/07/21 10/05/21  [place] Eric J, DO  sodium chloride flush 0.9 % SOLN injection Place 10 mLs into feeding tube every 12 (twelve) hours. 07/07/21 10/05/21  [place] Donnamarie Poag, DO  sucralfate (CARAFATE) 1 GM/10ML suspension Take 10 mLs (1 g total) by mouth 4 (four) times daily. 07/07/21 10/05/21  [place] Donnamarie Poag, DO  zolpidem (AMBIEN)  10 MG tablet Take 1 tablet (10 mg total) by mouth at bedtime as needed for sleep. Patient taking differently: Take 10 mg by mouth at bedtime. 04/14/21   Owens Shark, NP  fluticasone (FLONASE) 50 MCG/ACT nasal spray Place 1 spray into both nostrils daily. Patient not taking: Reported on 03/21/2019 12/29/17 06/29/19  Amaryllis Dyke, PA-C    Physical Exam: Vitals:   07/12/21 2015 07/12/21 2133 07/12/21 2200 07/12/21 2301  BP: (!) 134/94 129/78 (!) 133/92 121/76  Pulse: 98 (!) 107 (!) 106 (!) 110  Resp: '16 18 18 18  '$ Temp:    98.8 F (37.1 C)  TempSrc:    Oral  SpO2: 99% 99% 99% 100%    Constitutional: NAD, calm  Eyes: PERTLA, lids and conjunctivae normal ENMT: Mucous membranes are moist. Posterior pharynx clear of any exudate or lesions.   Neck: supple, no masses  Respiratory: no wheezing, no crackles. No accessory muscle use.  Cardiovascular: S1 & S2 heard, regular rate and rhythm. No extremity edema.  Abdomen: No distension, soft, no rebound pain or guarding. Bowel sounds active.  Musculoskeletal: no clubbing / cyanosis. No joint deformity upper and lower extremities.   Skin: Erythema about tube in abdomen with thick yellow discharge. Warm, dry, well-perfused. Neurologic: CN 2-12 grossly intact. Sensation intact. Moving all extremities.  Psychiatric: Alert and oriented to person, place, and situation. Calm and cooperative.    Labs and Imaging on Admission: I have personally reviewed following labs and imaging studies  CBC: Recent Labs  Lab 07/12/21 1922  WBC 8.5  NEUTROABS 7.2  HGB 11.3*  HCT 34.9*  MCV 92.1  PLT [id number]   Basic Metabolic Panel: Recent Labs  Lab 07/06/21 0518 07/12/21 1922  NA 136 140  K 4.0 2.6*  CL 104 100  CO2 27 27  GLUCOSE 101* 119*  BUN 11 9  CREATININE 0.62 0.83  CALCIUM 8.3* 9.3  MG 1.7  --   PHOS 3.6  --    GFR: Estimated Creatinine Clearance: 90.8 mL/min (by C-G formula based on SCr of 0.83 mg/dL). Liver Function Tests: Recent  Labs  Lab 07/12/21 1922  AST 20  ALT 15  ALKPHOS 101  BILITOT 0.8  PROT 6.8  ALBUMIN 3.3*   Recent Labs  Lab 07/12/21 1922  LIPASE 24   No results for input(s): AMMONIA in the last 168 hours. Coagulation Profile: No results for input(s): INR, PROTIME in the last 168 hours. Cardiac  Enzymes: No results for input(s): CKTOTAL, CKMB, CKMBINDEX, TROPONINI in the last 168 hours. BNP (last 3 results) No results for input(s): PROBNP in the last 8760 hours. HbA1C: No results for input(s): HGBA1C in the last 72 hours. CBG: Recent Labs  Lab 07/06/21 2031 07/07/21 0006 07/07/21 0417 07/07/21 0732 07/07/21 1200  GLUCAP 99 101* 98 99 95   Lipid Profile: No results for input(s): CHOL, HDL, LDLCALC, TRIG, CHOLHDL, LDLDIRECT in the last 72 hours. Thyroid Function Tests: No results for input(s): TSH, T4TOTAL, FREET4, T3FREE, THYROIDAB in the last 72 hours. Anemia Panel: No results for input(s): VITAMINB12, FOLATE, FERRITIN, TIBC, IRON, RETICCTPCT in the last 72 hours. Urine analysis:    Component Value Date/Time   COLORURINE AMBER (A) 07/12/2021 1922   APPEARANCEUR CLOUDY (A) 07/12/2021 1922   LABSPEC 1.015 07/12/2021 1922   PHURINE 6.0 07/12/2021 1922   GLUCOSEU NEGATIVE 07/12/2021 1922   HGBUR NEGATIVE 07/12/2021 1922   BILIRUBINUR NEGATIVE 07/12/2021 1922   KETONESUR 5 (A) 07/12/2021 1922   PROTEINUR 100 (A) 07/12/2021 1922   UROBILINOGEN 0.2 09/08/2014 1800   NITRITE NEGATIVE 07/12/2021 1922   LEUKOCYTESUR LARGE (A) 07/12/2021 1922   Sepsis Labs: '@LABRCNTIP'$ (procalcitonin:4,lacticidven:4) )No results found for this or any previous visit (from the past 240 hour(s)).   Radiological Exams on Admission: CT ABDOMEN PELVIS W CONTRAST  Result Date: 07/12/2021 CLINICAL DATA:  Abdominal pain and distension. Recent G-tube. History of pancreatic cancer. EXAM: CT ABDOMEN AND PELVIS WITH CONTRAST TECHNIQUE: Multidetector CT imaging of the abdomen and pelvis was performed using the  standard protocol following bolus administration of intravenous contrast. CONTRAST:  17m OMNIPAQUE IOHEXOL 350 MG/ML SOLN COMPARISON:  Most recent CT 06/26/2021 FINDINGS: Lower chest: Central line tip in the right ventricle. Heart is normal in size. No basilar nodule, focal airspace disease or pleural effusion. Hepatobiliary: Multiple hepatic masses are again seen, less well-defined on the current exam due to phase of contrast. Index lesion in the left lobe measures 7.5 x 5.8 cm, series 2, image 14, previously 8.0 x 6.1 cm. Index lesion in the right lobe measures 7.8 x 4.1 cm, series 2, image 12, previously 7.9 x 4.5 cm. There additional smaller lesions in the right and left hepatic lobe. The gallbladder is surgically absent. There is no intra or extrahepatic biliary ductal dilatation. Pancreas: Ill-defined hypoenhancing pancreatic head mass measures approximately 2.3 x 3.1 cm, series 2, image 38, previously 2.8 x 3.1 cm. Slight pancreatic ductal prominence and parenchymal atrophy are stable. No acute peripancreatic inflammation Spleen: Normal in size without focal abnormality. Splenic vein is patent. Adrenals/Urinary Tract: No adrenal nodule. No hydronephrosis or renal calculi. No focal renal lesion. Unremarkable urinary bladder for degree of distension. Stomach/Bowel: New gastrostomy tube is in place. The balloon is not opposed to the gastric or abdominal wall. There is mild edema and fat stranding along the subcutaneous portion of the gastrostomy tube but no focal fluid collection. Tip of the gastrostomy tube is in the proximal jejunum. The stomach is decompressed. Fluid/liquid stool in the colon with suggestion of mild colonic wall thickening at the hepatic flexure and proximal transverse, series 2, image 49. There is also wall thickening and mesenteric edema involving pelvic bowel small bowel loops. No pneumatosis, obstruction, or abnormal distention. Normal appendix. Vascular/Lymphatic: Mild aortic  atherosclerosis without aneurysm. Patent portal and splenic veins. No mesenteric or portal venous gas. No definite peripancreatic adenopathy. No enlarged lymph nodes in the abdomen or pelvis. Reproductive: Hysterectomy.  Quiescent ovaries.  No adnexal mass. Other:  Small volume of free fluid in the pelvis and right greater than left pericolic gutter, likely reactive. No definite omental thickening. No free air. Musculoskeletal: Sclerotic focus within posterior L3 vertebra is unchanged from prior. No acute osseous abnormalities are seen. IMPRESSION: 1. New gastrostomy tube in place. The balloon is within the gastric lumen, not opposed to the gastric or abdominal wall. There is mild edema and fat stranding along the subcutaneous portion of the gastrostomy tube but no focal fluid collection. 2. Fluid/liquid stool in the colon with suggestion of mild colonic wall thickening at the hepatic flexure and proximal transverse. There is also wall thickening and mesenteric edema involving pelvic bowel loops. Findings suspicious for enteritis/colitis. 3. Multiple hepatic masses, slightly decreased in size from prior exam. 4. Pancreatic head mass is unchanged in size from prior exam. 5. Small sclerotic focus within L3 vertebra is nonspecific in the setting. This is unchanged from prior exam. Aortic Atherosclerosis (ICD10-I70.0). Electronically Signed   By: Keith Rake M.D.   On: 07/12/2021 21:48    EKG: Independently reviewed. Sinus tachycardia, rate 104.   Assessment/Plan   1. Intractable N/V  - Recurring problem, no obstruction on CT  - Hold tube feeds for now, continue IVF and IV antiemetics, resume tube feeds and advance oral diet as she improves    2. Colitis  - She reports 2 days of diarrhea and CT findings suggestive of possible colitis  - Started on cefepime and Flagyl in ED  - Check GI pathogen panel, continue antibiotics    3. Cellulitis  - Cellulitis noted about the GJ tube; no fluid-collection on  CT   - Continue cefepime as above, transition to oral as tolerated    4. Pancreatic cancer  - Due to start cycle one of gemcitabine/Abraxane this coming week  - No change noted on CT in ED    5. Hypokalemia  - Serum potassium is 2.6 in setting of N/V/D  - Replace potasium, repeat chem panel in am   6. Chronic pain  - She has been unable to keep her long-acting morphine down d/t N/V and reports uncontrolled pain in ED  - Try morphine per tube, as-needed parenteral analgesia   7. Asthma  - No cough or wheezing on admission  - Continue ICS/LABA and as-needed albuterol     DVT prophylaxis: Lovenox  Code Status: Full   Level of Care: Level of care: Telemetry Family Communication: None present  Disposition Plan:  Patient is from: Home  Anticipated d/c is to: Home  Anticipated d/c date is: 8/1 or 07/14/21 Patient currently: pending control of N/V  Consults called: none  Admission status: Observation     Vianne Bulls, MD [facility]  07/12/2021, 11:24 PM

## 2021-07-12 NOTE — ED Provider Notes (Signed)
Emergency Medicine Provider Triage Evaluation Note  Wendy Hoffman , a 55 y.o. female  was evaluated in triage.  Pt complains of abdominal pain with intractable nausea/vomiting that started yesterday.  Patient states her vomitus is biliary.  Also reports associated diarrhea and pain along the upper abdomen around her feeding tube site.  Denies any chest pain, shortness of breath, fevers, chills, URI symptoms.  No urinary complaints.  Physical Exam  BP 110/84 (BP Location: Right Arm)   Pulse (!) 101   Temp 98.9 F (37.2 C) (Oral)   Resp 16   SpO2 97%  Gen:   Awake, actively vomiting during my exam Resp:  Normal effort  MSK:   Moves extremities without difficulty  Other:    Medical Decision Making  Medically screening exam initiated at 6:16 PM.  Appropriate orders placed.  Wendy Hoffman was informed that the remainder of the evaluation will be completed by another provider, this initial triage assessment does not replace that evaluation, and the importance of remaining in the ED until their evaluation is complete.   Wendy Sexton, PA-C 07/12/21 1817    Malvin Johns, MD 07/12/21 2036

## 2021-07-12 NOTE — Progress Notes (Signed)
Pharmacy Antibiotic Note  Wendy Hoffman is a 55 y.o. female admitted on 07/12/2021 with  hx of pancreatic cancer, renal failure here presenting with abdominal pain.  Pt was recently admitted for acute renal failure abdominal pain secondary to pancreatic cancer.  Patient also has hypokalemia and required G-tube placement.  Patient states that she was discharged home several days ago.  Pharmacy has been consulted to dose cefepime for UTI/intra-abdominal infection.  Plan: Cefepime 2gm IV q8h Follow renal function, cultures and clinical course     Temp (24hrs), Avg:98.9 F (37.2 C), Min:98.9 F (37.2 C), Max:98.9 F (37.2 C)  Recent Labs  Lab 07/06/21 0518 07/12/21 1922  WBC  --  8.5  CREATININE 0.62 0.83    Estimated Creatinine Clearance: 90.8 mL/min (by C-G formula based on SCr of 0.83 mg/dL).    Allergies  Allergen Reactions   Blueberry [Vaccinium Angustifolium] Anaphylaxis   Mango Flavor Anaphylaxis   Oxaliplatin Itching and Other (See Comments)    Patient has reacted to oxaliplatin with minor symptoms of neuropathy and itching.   Reglan [Metoclopramide] Nausea And Vomiting   Aspirin Rash   Penicillins Hives    Has patient had a PCN reaction causing immediate rash, facial/tongue/throat swelling, SOB or lightheadedness with hypotension: Yes Has patient had a PCN reaction causing severe rash involving mucus membranes or skin necrosis: No Has patient had a PCN reaction that required hospitalization No Has patient had a PCN reaction occurring within the last 10 years: No If all of the above answers are "NO", then may proceed with Cephalosporin use.       Thank you for allowing pharmacy to be a part of this patient's care.  Dolly Rias RPh 07/12/2021, 10:53 PM

## 2021-07-12 NOTE — ED Provider Notes (Signed)
[facility] DEPT Provider Note   CSN: [id number] Arrival date & time: 07/12/21  1741     History Chief Complaint  Patient presents with   Abdominal Pain    Wendy Hoffman is a 55 y.o. female hx of pancreatic cancer, renal failure here presenting with abdominal pain.  Patient was recently admitted for acute renal failure abdominal pain secondary to pancreatic cancer.  Patient also has hypokalemia and required G-tube placement.  Patient states that she was discharged home several days ago.  Since discharge, she has pain around her G-tube site.  She states that her tube feeds to go in every night with no issues.  She is also on morphine for pain but is not controlling her pain.  She also unable to keep anything down orally for 2 days.  The history is provided by the patient.      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 06/18/2021   Malnutrition of moderate degree 06/18/2021   Cancer ([place]) 06/17/2021   Cervical ca ([place]) 06/17/2021   Nausea and vomiting 06/17/2021   Genetic testing 03/23/2021   Port-A-Cath in place 03/16/2021   Goals of care, counseling/discussion 03/06/2021   Morbidly obese ([place]) 02/27/2021   Heme positive stool    Iron deficiency anemia due to chronic blood loss    Epigastric pain    Malignant neoplasm of head of pancreas ([place]) 02/17/2021   Aortic atherosclerosis ([place]) 02/17/2021   GI bleeding 02/16/2021   Class 3 obesity with current BMI at 44.30 kg/m  02/16/2021   COPD (chronic obstructive pulmonary disease) ([place]) 02/16/2021   Symptomatic anemia    Pancreatic cancer ([place]) 02/2021   Snoring 05/03/2016   Morbid obesity ([place]) 04/25/2014   Asthma exacerbation 04/22/2014   Asthma attack 04/22/2014   Asthma with acute exacerbation 11/03/2013   Hypothyroidism 11/03/2013   Tobacco abuse 11/03/2013   Migraine 09/22/2012   Weakness of right side of body 09/22/2012   Thyroid disease    Hypertension    High cholesterol    H/O: hysterectomy    Asthma 11/26/2008   HEADACHE, CHRONIC 11/26/2008    Past Surgical History:  Procedure Laterality Date   ABDOMINAL HYSTERECTOMY     BIOPSY  02/19/2021   Procedure: BIOPSY;  Surgeon: Mauri Pole, MD;  Location: [facility];  Service: Endoscopy;;   BIOPSY  06/19/2021   Procedure: BIOPSY;  Surgeon: Otis Brace, MD;  Location: [facility];  Service: Gastroenterology;;   CESAREAN SECTION     3 occasions   CHOLECYSTECTOMY     ESOPHAGOGASTRODUODENOSCOPY (EGD) WITH PROPOFOL N/A 02/19/2021   Procedure: ESOPHAGOGASTRODUODENOSCOPY (EGD) WITH PROPOFOL;  Surgeon: Mauri Pole, MD;  Location: [facility];  Service: Endoscopy;  Laterality: N/A;   ESOPHAGOGASTRODUODENOSCOPY (EGD) WITH PROPOFOL N/A 06/19/2021   Procedure: ESOPHAGOGASTRODUODENOSCOPY (EGD) WITH PROPOFOL;  Surgeon: Otis Brace, MD;  Location: [facility];  Service: Gastroenterology;  Laterality: N/A;   FLEXIBLE SIGMOIDOSCOPY N/A 02/19/2021   Procedure: FLEXIBLE SIGMOIDOSCOPY;  Surgeon: Mauri Pole, MD;  Location: [facility];  Service: Endoscopy;  Laterality: N/A;   IR GASTROSTOMY TUBE MOD SED  07/01/2021   IR IMAGING GUIDED PORT INSERTION  02/24/2021   IR US GUIDE BX ASP/DRAIN  02/24/2021   THYROIDECTOMY, PARTIAL     Goiter     OB History   No obstetric history on file.     Family History  Problem Relation Age of Onset   Migraines Sister    Non-Hodgkin's lymphoma Other        maternal half brother's grandson    Non-Hodgkin's lymphoma Half-Brother        maternal half brother; dx 68s or 55s   Pancreatic cancer Other 6       MGF's niece    Social History   Tobacco Use   Smoking status: Former    Types: Cigarettes   Smokeless tobacco: Never  Vaping Use   Vaping Use: Never used  Substance Use Topics   Alcohol use: No   Drug use: No    Home Medications Prior to Admission medications   Medication Sig Start Date End Date Taking? Authorizing Provider  albuterol (VENTOLIN HFA) 108 (90 Base) MCG/ACT inhaler INHALE 1 TO 2 PUFFS BY MOUTH EVERY 6 HOURS AS NEEDED FOR WHEEZING AND FOR SHORTNESS OF BREATH Patient taking differently: Inhale 1-2 puffs into the lungs every 6 (six) hours as needed for wheezing or shortness of breath. 01/06/21 01/06/22  Everardo Beals, NP  alum & mag hydroxide-simeth (MAALOX/MYLANTA) 200-200-20 MG/5ML suspension Take 30 mLs by mouth every 6 (six) hours as needed for indigestion or heartburn. 07/07/21   [place] Eric J, DO  amLODipine (NORVASC) 10 MG tablet Take 1 tablet (10 mg total) by mouth daily. 07/08/21 10/06/21  [place] Donnamarie Poag, DO  bisacodyl (DULCOLAX) 5 MG EC tablet Take 2 tablets (10 mg total) by mouth daily. 07/08/21   [place] Donnamarie Poag, DO  budesonide-formoterol (SYMBICORT) 160-4.5 MCG/ACT inhaler Inhale 2 puffs into the lungs 2 (two) times daily. 07/07/21   [place] Eric J, DO  gabapentin (NEURONTIN) 300 MG capsule TAKE 1 CAPSULE AS NEEDED BY ORAL ROUTE AT BEDTIME. Patient taking differently: Take 300 mg by mouth at bedtime as needed (neuropathy). 01/06/21 01/06/22  [place], Joelene Millin, NP  lidocaine-prilocaine (EMLA) cream Apply 1 application topically as needed. Patient taking differently: Apply 1 application topically as needed (port access). 03/06/21   Ladell Pier, MD  magnesium oxide (MAG-OX) 400 (240 Mg) MG tablet Take 2 tablets (800 mg total) by mouth daily. 07/08/21 10/06/21  [place] Donnamarie Poag, DO  morphine (MS CONTIN) 60 MG 12 hr tablet Take 1 tablet (60 mg  total) by mouth every 12 (twelve) hours. 07/07/21 08/06/21  [place] Eric J, DO  Nutritional Supplements (FEEDING SUPPLEMENT, OSMOLITE 1.5 CAL,) LIQD Place 1,000 mLs into feeding tube daily. 07/07/21   [place] Eric J, DO  ondansetron (ZOFRAN) 4 MG tablet Take 1 tablet (4 mg total) by mouth every 6 (six) hours as needed for nausea. 07/07/21 08/06/21  [place] Eric J, DO  oxyCODONE (ROXICODONE) 5 MG/5ML solution Take 10 mLs (10 mg total) by mouth every 4 (four) hours as needed for severe pain, breakthrough pain or moderate pain. 07/07/21 08/06/21  [place] Donnamarie Poag, DO  pantoprazole (PROTONIX) 40 MG tablet Take 1 tablet (40 mg total) by mouth 2 (two) times daily. 07/07/21 10/05/21  [place] Eric J, DO  polyethylene  glycol (MIRALAX / GLYCOLAX) 17 g packet Take 17 g by mouth daily as needed for moderate constipation. 07/07/21   [place] Donnamarie Poag, DO  potassium chloride (MICRO-K) 10 MEQ CR capsule Take 2 capsules (20 mEq total) by mouth 2 (two) times daily. 06/05/21   Ladell Pier, MD  prochlorperazine (COMPAZINE) 10 MG tablet TAKE 1 TABLET(10 MG) BY MOUTH EVERY 6 HOURS AS NEEDED FOR NAUSEA OR VOMITING Patient taking differently: Take 10 mg by mouth every 6 (six) hours as needed for nausea or vomiting. 05/04/21   Ladell Pier, MD  senna-docusate (SENOKOT-S) 8.6-50 MG tablet Take 2 tablets by mouth 2 (two) times daily. 07/07/21 10/05/21  [place] Eric J, DO  sodium chloride flush 0.9 % SOLN injection Place 10 mLs into feeding tube every 12 (twelve) hours. 07/07/21 10/05/21  [place] Donnamarie Poag, DO  sucralfate (CARAFATE) 1 GM/10ML suspension Take 10 mLs (1 g total) by mouth 4 (four) times daily. 07/07/21 10/05/21  [place] Donnamarie Poag, DO  zolpidem (AMBIEN) 10 MG tablet Take 1 tablet (10 mg total) by mouth at bedtime as needed for sleep. Patient taking differently: Take 10 mg by mouth at bedtime. 04/14/21   Owens Shark, NP  fluticasone (FLONASE) 50 MCG/ACT nasal spray Place 1 spray into both nostrils daily. Patient not  taking: Reported on 03/21/2019 12/29/17 06/29/19  Petrucelli, Aldona Bar R, PA-C    Allergies    Blueberry [vaccinium angustifolium], Mango flavor, Oxaliplatin, Reglan [metoclopramide], Aspirin, and Penicillins  Review of Systems   Review of Systems  Gastrointestinal:  Positive for abdominal pain.  All other systems reviewed and are negative.  Physical Exam Updated Vital Signs BP 129/78 (BP Location: Right Arm)   Pulse (!) 107   Temp 98.9 F (37.2 C) (Oral)   Resp 18   SpO2 99%   Physical Exam Vitals and nursing note reviewed.  Constitutional:      Comments: Chronically ill and uncomfortable.  HENT:     Head: Normocephalic.  Eyes:     Extraocular Movements: Extraocular movements intact.  Cardiovascular:     Rate and Rhythm: Normal rate and regular rhythm.     Heart sounds: Normal heart sounds.  Pulmonary:     Effort: Pulmonary effort is normal.     Breath sounds: Normal breath sounds.  Abdominal:     Comments: G tube with gastric content.  Mild tenderness inferior to the G-tube but there is no obvious erythema around the G-tube.  Skin:    General: Skin is warm.     Capillary Refill: Capillary refill takes less than 2 seconds.  Neurological:     General: No focal deficit present.     Mental Status: She is oriented to person, place, and time.  Psychiatric:        Mood and Affect: Mood normal.        Behavior: Behavior normal.    ED Results / Procedures / Treatments   Labs (all labs ordered are listed, but only abnormal results are displayed) Labs Reviewed  COMPREHENSIVE METABOLIC PANEL - Abnormal; Notable for the following components:      Result Value   Potassium 2.6 (*)    Glucose, Bld 119 (*)    Albumin 3.3 (*)    All other components within normal limits  CBC WITH DIFFERENTIAL/PLATELET - Abnormal; Notable for the following components:   RBC 3.79 (*)    Hemoglobin 11.3 (*)    HCT 34.9 (*)    RDW 15.6 (*)    All  other components within normal limits   URINALYSIS, ROUTINE W REFLEX MICROSCOPIC - Abnormal; Notable for the following components:   Color, Urine AMBER (*)    APPearance CLOUDY (*)    Ketones, ur 5 (*)    Protein, ur 100 (*)    Leukocytes,Ua LARGE (*)    WBC, UA >50 (*)    Bacteria, UA MANY (*)    All other components within normal limits  RESP PANEL BY RT-PCR (FLU A&B, COVID) ARPGX2  LIPASE, BLOOD  I-STAT BETA HCG BLOOD, ED ([facility], [facility], [facility] ONLY)    EKG None  Radiology CT ABDOMEN PELVIS W CONTRAST  Result Date: 07/12/2021 CLINICAL DATA:  Abdominal pain and distension. Recent G-tube. History of pancreatic cancer. EXAM: CT ABDOMEN AND PELVIS WITH CONTRAST TECHNIQUE: Multidetector CT imaging of the abdomen and pelvis was performed using the standard protocol following bolus administration of intravenous contrast. CONTRAST:  61m OMNIPAQUE IOHEXOL 350 MG/ML SOLN COMPARISON:  Most recent CT 06/26/2021 FINDINGS: Lower chest: Central line tip in the right ventricle. Heart is normal in size. No basilar nodule, focal airspace disease or pleural effusion. Hepatobiliary: Multiple hepatic masses are again seen, less well-defined on the current exam due to phase of contrast. Index lesion in the left lobe measures 7.5 x 5.8 cm, series 2, image 14, previously 8.0 x 6.1 cm. Index lesion in the right lobe measures 7.8 x 4.1 cm, series 2, image 12, previously 7.9 x 4.5 cm. There additional smaller lesions in the right and left hepatic lobe. The gallbladder is surgically absent. There is no intra or extrahepatic biliary ductal dilatation. Pancreas: Ill-defined hypoenhancing pancreatic head mass measures approximately 2.3 x 3.1 cm, series 2, image 38, previously 2.8 x 3.1 cm. Slight pancreatic ductal prominence and parenchymal atrophy are stable. No acute peripancreatic inflammation Spleen: Normal in size without focal abnormality. Splenic vein is patent. Adrenals/Urinary Tract: No adrenal nodule. No hydronephrosis or renal calculi. No focal renal lesion.  Unremarkable urinary bladder for degree of distension. Stomach/Bowel: New gastrostomy tube is in place. The balloon is not opposed to the gastric or abdominal wall. There is mild edema and fat stranding along the subcutaneous portion of the gastrostomy tube but no focal fluid collection. Tip of the gastrostomy tube is in the proximal jejunum. The stomach is decompressed. Fluid/liquid stool in the colon with suggestion of mild colonic wall thickening at the hepatic flexure and proximal transverse, series 2, image 49. There is also wall thickening and mesenteric edema involving pelvic bowel small bowel loops. No pneumatosis, obstruction, or abnormal distention. Normal appendix. Vascular/Lymphatic: Mild aortic atherosclerosis without aneurysm. Patent portal and splenic veins. No mesenteric or portal venous gas. No definite peripancreatic adenopathy. No enlarged lymph nodes in the abdomen or pelvis. Reproductive: Hysterectomy.  Quiescent ovaries.  No adnexal mass. Other: Small volume of free fluid in the pelvis and right greater than left pericolic gutter, likely reactive. No definite omental thickening. No free air. Musculoskeletal: Sclerotic focus within posterior L3 vertebra is unchanged from prior. No acute osseous abnormalities are seen. IMPRESSION: 1. New gastrostomy tube in place. The balloon is within the gastric lumen, not opposed to the gastric or abdominal wall. There is mild edema and fat stranding along the subcutaneous portion of the gastrostomy tube but no focal fluid collection. 2. Fluid/liquid stool in the colon with suggestion of mild colonic wall thickening at the hepatic flexure and proximal transverse. There is also wall thickening and mesenteric edema involving pelvic bowel loops. Findings suspicious for enteritis/colitis. 3. Multiple hepatic  masses, slightly decreased in size from prior exam. 4. Pancreatic head mass is unchanged in size from prior exam. 5. Small sclerotic focus within L3 vertebra  is nonspecific in the setting. This is unchanged from prior exam. Aortic Atherosclerosis (ICD10-I70.0). Electronically Signed   By: Keith Rake M.D.   On: 07/12/2021 21:48    Procedures Procedures   Medications Ordered in ED Medications  ceFEPIme (MAXIPIME) 2 g in sodium chloride 0.9 % 100 mL IVPB (2 g Intravenous New Bag/Given 07/12/21 2130)  potassium chloride 10 mEq in 100 mL IVPB (has no administration in time range)  HYDROmorphone (DILAUDID) injection 1 mg (has no administration in time range)  metroNIDAZOLE (FLAGYL) IVPB 500 mg (has no administration in time range)  sodium chloride 0.9 % bolus 1,000 mL (0 mLs Intravenous Stopped 07/12/21 2045)  ondansetron (ZOFRAN) injection 4 mg (4 mg Intravenous Given 07/12/21 1930)  HYDROmorphone (DILAUDID) injection 1 mg (1 mg Intravenous Given 07/12/21 1930)  iohexol (OMNIPAQUE) 350 MG/ML injection 80 mL (80 mLs Intravenous Contrast Given 07/12/21 2058)    ED Course  I have reviewed the triage vital signs and the nursing notes.  Pertinent labs & imaging results that were available during my care of the patient were reviewed by me and considered in my medical decision making (see chart for details).    MDM Rules/Calculators/A&P                          Wendy Hoffman is a 55 y.o. female history of pancreatic cancer here presenting with normal pain and vomiting and pain around the G-tube site.  G-tube appears to be in place with gastric contents.  Plan to get CBC and CMP and lipase and CT abdomen pelvis and urinalysis  9:55 PM CT showed colitis and some inflammation around the G-tube.  Patient also has a UTI as well.  Given cefepime and Flagyl.  Patient's potassium is 2.6.  Patient is unable to keep anything down orally.  Will admit for hypokalemia and colitis.    Final Clinical Impression(s) / ED Diagnoses Final diagnoses:  None    Rx / DC Orders ED Discharge Orders     None        Drenda Freeze, MD 07/12/21 2156

## 2021-07-12 NOTE — ED Triage Notes (Signed)
Patient hx pancreatic cancer reports pain at feeding tube site since last night. Vomiting in triage.

## 2021-07-13 DIAGNOSIS — N390 Urinary tract infection, site not specified: Secondary | ICD-10-CM

## 2021-07-13 DIAGNOSIS — R112 Nausea with vomiting, unspecified: Secondary | ICD-10-CM | POA: Diagnosis not present

## 2021-07-13 DIAGNOSIS — J4540 Moderate persistent asthma, uncomplicated: Secondary | ICD-10-CM | POA: Diagnosis not present

## 2021-07-13 DIAGNOSIS — E876 Hypokalemia: Secondary | ICD-10-CM | POA: Diagnosis not present

## 2021-07-13 DIAGNOSIS — L03311 Cellulitis of abdominal wall: Secondary | ICD-10-CM | POA: Diagnosis not present

## 2021-07-13 LAB — CBC WITH DIFFERENTIAL/PLATELET
Abs Immature Granulocytes: 0.01 10*3/uL (ref 0.00–0.07)
Basophils Absolute: 0 10*3/uL (ref 0.0–0.1)
Basophils Relative: 1 %
Eosinophils Absolute: 0.2 10*3/uL (ref 0.0–0.5)
Eosinophils Relative: 3 %
HCT: 28.8 % — ABNORMAL LOW (ref 36.0–46.0)
Hemoglobin: 9.2 g/dL — ABNORMAL LOW (ref 12.0–15.0)
Immature Granulocytes: 0 %
Lymphocytes Relative: 22 %
Lymphs Abs: 1.1 10*3/uL (ref 0.7–4.0)
MCH: 29.9 pg (ref 26.0–34.0)
MCHC: 31.9 g/dL (ref 30.0–36.0)
MCV: 93.5 fL (ref 80.0–100.0)
Monocytes Absolute: 0.5 10*3/uL (ref 0.1–1.0)
Monocytes Relative: 10 %
Neutro Abs: 3.1 10*3/uL (ref 1.7–7.7)
Neutrophils Relative %: 64 %
Platelets: 293 10*3/uL (ref 150–400)
RBC: 3.08 MIL/uL — ABNORMAL LOW (ref 3.87–5.11)
RDW: 15.9 % — ABNORMAL HIGH (ref 11.5–15.5)
WBC: 4.9 10*3/uL (ref 4.0–10.5)
nRBC: 0 % (ref 0.0–0.2)

## 2021-07-13 LAB — COMPREHENSIVE METABOLIC PANEL WITH GFR
ALT: 11 U/L (ref 0–44)
AST: 17 U/L (ref 15–41)
Albumin: 2.8 g/dL — ABNORMAL LOW (ref 3.5–5.0)
Alkaline Phosphatase: 77 U/L (ref 38–126)
Anion gap: 6 (ref 5–15)
BUN: 9 mg/dL (ref 6–20)
CO2: 26 mmol/L (ref 22–32)
Calcium: 8.5 mg/dL — ABNORMAL LOW (ref 8.9–10.3)
Chloride: 108 mmol/L (ref 98–111)
Creatinine, Ser: 0.77 mg/dL (ref 0.44–1.00)
GFR, Estimated: >60 mL/min
Glucose, Bld: 87 mg/dL (ref 70–99)
Potassium: 3 mmol/L — ABNORMAL LOW (ref 3.5–5.1)
Sodium: 140 mmol/L (ref 135–145)
Total Bilirubin: 0.6 mg/dL (ref 0.3–1.2)
Total Protein: 5.6 g/dL — ABNORMAL LOW (ref 6.5–8.1)

## 2021-07-13 LAB — MAGNESIUM: Magnesium: 1.8 mg/dL (ref 1.7–2.4)

## 2021-07-13 LAB — PHOSPHORUS: Phosphorus: 3.7 mg/dL (ref 2.5–4.6)

## 2021-07-13 MED ORDER — HYDROMORPHONE HCL 1 MG/ML IJ SOLN
1.0000 mg | INTRAMUSCULAR | Status: DC | PRN
Start: 2021-07-13 — End: 2021-07-13

## 2021-07-13 MED ORDER — MAGNESIUM SULFATE 2 GM/50ML IV SOLN
2.0000 g | Freq: Once | INTRAVENOUS | Status: AC
  Administered 2021-07-13: 2 g via INTRAVENOUS
  Filled 2021-07-13: qty 50

## 2021-07-13 MED ORDER — POTASSIUM CHLORIDE 10 MEQ/100ML IV SOLN
10.0000 meq | INTRAVENOUS | Status: AC
  Administered 2021-07-13: 10 meq via INTRAVENOUS
  Filled 2021-07-13: qty 100

## 2021-07-13 MED ORDER — HYDROMORPHONE HCL 1 MG/ML IJ SOLN
1.0000 mg | Freq: Once | INTRAMUSCULAR | Status: AC
  Administered 2021-07-13: 1 mg via INTRAVENOUS
  Filled 2021-07-13: qty 1

## 2021-07-13 MED ORDER — SODIUM CHLORIDE 0.9 % IV SOLN: INTRAVENOUS | Status: AC

## 2021-07-13 MED ORDER — HYDROMORPHONE HCL 1 MG/ML IJ SOLN
1.0000 mg | INTRAMUSCULAR | Status: DC | PRN
  Administered 2021-07-13 – 2021-07-15 (×10): 1 mg via INTRAVENOUS
  Filled 2021-07-13 (×10): qty 1

## 2021-07-13 MED ORDER — CHLORHEXIDINE GLUCONATE CLOTH 2 % EX PADS
6.0000 | MEDICATED_PAD | Freq: Every day | CUTANEOUS | Status: DC
  Administered 2021-07-14 – 2021-07-18 (×4): 6 via TOPICAL

## 2021-07-13 NOTE — Progress Notes (Signed)
PROGRESS NOTE    Wendy Hoffman  [id number] DOB: 10-Mar-1966 DOA: 07/12/2021 PCP: Patient, No Pcp Per (Inactive)   Brief Narrative:  The patient is a 55 year old obese African-American female with a past medical history significant for but not limited to pancreatic cancer, hypertension, asthma, history of protein calorie malnutrition, recent hospital admission where she ended up having a G-tube placement by IR on 07/01/2021 and started on tube feedings and subsequently discharged on July 07, 2021.  She presents to the hospital today for recurrent nausea, nonbloody vomiting as well as diarrhea and increasing pain around her tube site.  She is reported that she has been tolerating her tube feedings at home but then developed pain around her tube site and started having nausea, vomiting with inability to take anything by mouth and had multiple loose stools daily.  She also noted some thick yellow drainage around the tube site but denies any fevers or chills.  Upon arrival to the ED she is found to be afebrile and saturating well on room air but she was slightly tachycardic and had a stable blood pressure.  She was noted to be hypokalemic and CBC did show a normocytic anemia.  CT of the abdomen pelvis done and demonstrated a new gastrostomy tube with mild edema and stranding around the subcutaneous portion of the tube without fluid collection.  CT was also notable for liquid stool in the colon with mild colonic wall thickening suggestive of colitis.  In the ED she is given a liter of normal saline and started on antibiotics with IV cefepime, IV Flagyl and given IV potassium as well as pain control with IV Dilaudid.  Assessment & Plan:   Principal Problem:   Intractable nausea and vomiting Active Problems:   Asthma   Malignant neoplasm of head of pancreas (HCC)   Hypokalemia   UTI (urinary tract infection)   Colitis   Cellulitis  Intractable N/V  -Recurring problem, no obstruction on CT  -CT  Scan showed "New gastrostomy tube in place. The balloon is within the gastric lumen, not opposed to the gastric or abdominal wall. There is mild edema and fat stranding along the subcutaneous portion of the gastrostomy tube but no focal fluid collection. Fluid/liquid stool in the colon with suggestion of mild colonic  wall thickening at the hepatic flexure and proximal transverse. There is also wall thickening and mesenteric edema involving pelvic bowel loops. Findings suspicious for enteritis/colitis. Multiple hepatic masses, slightly decreased in size from prior exam. Pancreatic head mass is unchanged in size from prior exam. Small sclerotic focus within L3 vertebra is nonspecific in the setting. This is unchanged from prior exam." -Continue to Hold tube feeds for now,  -Continue IVF and reduced the rate from 100 mL/hr to 75 mL/hr; Received a 1 Liter NS bolus in the ED -C/w IV antiemetics with po/IV Ondansetron 4 mg q6hprn Nausea and c/w IV Promethazine 6.25 mg q6hprn for Refractory Vomiting  -Resume tube feeds and advance oral diet as she improves but for now will continue CLD   Acute Colitis  -She reports 2 days of diarrhea and CT findings suggestive of possible colitis  -Started on IV cefepime and Flagyl in ED and will continue for now  -Check GI pathogen panel and place on Contact Precautions  -Pain control with IV Hydromorphone 1 mg q3hprn Severe Pain    Cellulitis  -Cellulitis noted about the GJ tube; no fluid-collection on CT as noted as above -Continue cefepime as above, transition to oral as  tolerated   -Continue to Monitor closely -She is afebrile and has no Leukocytosis    Pancreatic cancer  -Due to start cycle one of Gemcitabine/Abraxane this coming week  -No change noted on CT in ED   -Will notify her Primary Oncologist Dr. Benay Spice about admission   Chronic pain  -She has been unable to keep her long-acting morphine down d/t N/V and reports uncontrolled pain in ED  -Try  Morphine 20 mg per Tube q4h, as-needed parenteral analgesia  -C/w IV Hydromorphone 1 mg q3hprn    Asthma  -No cough or wheezing on admission  -Continue ICS/LABA with Dulera 2 puff IH BID and Albuterol 3 mL IH q6hprn Wheezing and SOB  Hypokalemia -K+ was 2.6 on Admission and is now 3.0 -Replete with IV Kcl 40 mEQ x1 -Also repleting with po Kcl 20 mEQ per Tube BID -Mag Level was 1.8 and will also replete with IV Mag Sulfate 2 grams -Continue to Monitor and Replete as Necessary  -Repeat CMP in the AM   GERD -C/w Pantoprazole 40 mg IV q24h  Hypertension -C/w Amlodipine 10 mg per Tube Daily -Continue to Monitor BP per Protocol -Last BP was   ? UTI, poA -Urinalysis on admission showed cloudy appearance with amber color urine, 5 ketones, large leukocytes, negative nitrites, many bacteria, 11-20 RBCs per high-powered field, 21-50 squamous epithelial cells, greater than 50 WBCs with urine culture pending -Likely this is a bad sample given the amount of squamous epithelial cells but she is already on antibiotics with IV cefepime and Flagyl as above -Continue monitor to see if urine culture will grow anything  Normocytic Anemia -Patient's Hgb/Hct went from 11.3/34.9 -> 9.2/28.8 -Check Anemia Panel in the AM -Continue to Monitor for S/Sx of Bleeding; No overt Bleeding noted -Repeat CBC in the AM   Obesity -Complicates overall prognosis and care -Estimated body mass index is 34.02 kg/m as calculated from the following:   Height as of this encounter: '5\' 4"'$  (1.626 m).   Weight as of this encounter: 89.9 kg. -Weight Loss and Dietary Counseling given    DVT prophylaxis: Enoxaparin 40 mg sq q24h Code Status: FULL CODE Family Communication: No family present at bedside Disposition Plan: Pending further clinical improvement and tolerance of her diet and improved back to her baseline  Status is: Observation  The patient will require care spanning > 2 midnights and should be moved to  inpatient because: Unsafe d/c plan, IV treatments appropriate due to intensity of illness or inability to take PO, and Inpatient level of care appropriate due to severity of illness  Dispo: The patient is from: Home              Anticipated d/c is to: Home              Patient currently is not medically stable to d/c.   Difficult to place patient No  Consultants:  Notifying Oncology via [place] as Courtesy   Procedures: None  Antimicrobials:  Anti-infectives (From admission, onward)    Start     Dose/Rate Route Frequency Ordered Stop   07/13/21 0600  ceFEPIme (MAXIPIME) 2 g in sodium chloride 0.9 % 100 mL IVPB        2 g 200 mL/hr over 30 Minutes Intravenous Every 8 hours 07/12/21 2251     07/12/21 2200  metroNIDAZOLE (FLAGYL) IVPB 500 mg        500 mg 100 mL/hr over 60 Minutes Intravenous Every 8 hours 07/12/21 2152  07/12/21 2045  ceFEPIme (MAXIPIME) 2 g in sodium chloride 0.9 % 100 mL IVPB        2 g 200 mL/hr over 30 Minutes Intravenous  Once 07/12/21 2037 07/12/21 2200        Subjective: Seen and examined at bedside this time some abdominal pain and states that she was nauseous and vomiting but not now.  Feels a little bit better.  States that she is also had some drainage around her gastrostomy tube. Feels okay but states that anytime that she tried to eat she became nauseous and started vomiting.  Still having some diarrhea.  No other concerns or plans at this time  Objective: Vitals:   07/13/21 1130 07/13/21 1230 07/13/21 1330 07/13/21 1430  BP: 111/70 124/83 (!) 114/94 130/76  Pulse: 87 84 86 77  Resp: '14 11 15 12  '$ Temp:      TempSrc:      SpO2: 95% 96% 99% 91%  Weight:      Height:        Intake/Output Summary (Last 24 hours) at 07/13/2021 1513 Last data filed at 07/13/2021 0116 Gross per 24 hour  Intake 1586.8 ml  Output --  Net 1586.8 ml   Filed Weights   07/13/21 0139  Weight: 89.9 kg   Examination: Physical Exam:  Constitutional: WN/WD obese AAF  in, NAD and appears calm but a little uncomfortable Eyes: Lids and conjunctivae normal, sclerae anicteric  ENMT: External Ears, Nose appear normal. Grossly normal hearing.  Neck: Appears normal, supple, no cervical masses, normal ROM, no appreciable thyromegaly; no JVD Respiratory: Diminished to auscultation bilaterally, no wheezing, rales, rhonchi or crackles. Normal respiratory effort and patient is not tachypenic. No accessory muscle use. Unlabored breathing  Cardiovascular: RRR, no murmurs / rubs / gallops. S1 and S2 auscultated. Mild extremity edema.  Abdomen: Soft, Tender, Distended 2/2 body habitus. G tube in place with some mild drainage around the stoma. Bowel sounds positive.  GU: Deferred. Musculoskeletal: No clubbing / cyanosis of digits/nails. No joint deformity upper and lower extremities.  Skin: No rashes, lesions, ulcers on a limited skin evaluation. No induration; Warm and dry.  Neurologic: CN 2-12 grossly intact with no focal deficits. Romberg sign and cerebellar reflexes not assessed.  Psychiatric: Normal judgment and insight. Alert and oriented x 3. Normal mood and appropriate affect.   Data Reviewed: I have personally reviewed following labs and imaging studies  CBC: Recent Labs  Lab 07/12/21 1922 07/13/21 1000  WBC 8.5 4.9  NEUTROABS 7.2 3.1  HGB 11.3* 9.2*  HCT 34.9* 28.8*  MCV 92.1 93.5  PLT 394 0000000   Basic Metabolic Panel: Recent Labs  Lab 07/12/21 1922 07/13/21 1000  NA 140 140  K 2.6* 3.0*  CL 100 108  CO2 27 26  GLUCOSE 119* 87  BUN 9 9  CREATININE 0.83 0.77  CALCIUM 9.3 8.5*  MG  --  1.8  PHOS  --  3.7   GFR: Estimated Creatinine Clearance: 88.3 mL/min (by C-G formula based on SCr of 0.77 mg/dL). Liver Function Tests: Recent Labs  Lab 07/12/21 1922 07/13/21 1000  AST 20 17  ALT 15 11  ALKPHOS 101 77  BILITOT 0.8 0.6  PROT 6.8 5.6*  ALBUMIN 3.3* 2.8*   Recent Labs  Lab 07/12/21 1922  LIPASE 24   No results for input(s): AMMONIA  in the last 168 hours. Coagulation Profile: No results for input(s): INR, PROTIME in the last 168 hours. Cardiac Enzymes: No results for  input(s): CKTOTAL, CKMB, CKMBINDEX, TROPONINI in the last 168 hours. BNP (last 3 results) No results for input(s): PROBNP in the last 8760 hours. HbA1C: No results for input(s): HGBA1C in the last 72 hours. CBG: Recent Labs  Lab 07/06/21 2031 07/07/21 0006 07/07/21 0417 07/07/21 0732 07/07/21 1200  GLUCAP 99 101* 98 99 95   Lipid Profile: No results for input(s): CHOL, HDL, LDLCALC, TRIG, CHOLHDL, LDLDIRECT in the last 72 hours. Thyroid Function Tests: No results for input(s): TSH, T4TOTAL, FREET4, T3FREE, THYROIDAB in the last 72 hours. Anemia Panel: No results for input(s): VITAMINB12, FOLATE, FERRITIN, TIBC, IRON, RETICCTPCT in the last 72 hours. Sepsis Labs: No results for input(s): PROCALCITON, LATICACIDVEN in the last 168 hours.  Recent Results (from the past 240 hour(s))  Resp Panel by RT-PCR (Flu A&B, Covid) Nasopharyngeal Swab     Status: None   Collection Time: 07/12/21  9:55 PM   Specimen: Nasopharyngeal Swab; Nasopharyngeal(NP) swabs in vial transport medium  Result Value Ref Range Status   SARS Coronavirus 2 by RT PCR NEGATIVE NEGATIVE Final    Comment: (NOTE) SARS-CoV-2 target nucleic acids are NOT DETECTED.  The SARS-CoV-2 RNA is generally detectable in upper respiratory specimens during the acute phase of infection. The lowest concentration of SARS-CoV-2 viral copies this assay can detect is 138 copies/mL. A negative result does not preclude SARS-Cov-2 infection and should not be used as the sole basis for treatment or other patient management decisions. A negative result may occur with  improper specimen collection/handling, submission of specimen other than nasopharyngeal swab, presence of viral mutation(s) within the areas targeted by this assay, and inadequate number of viral copies(<138 copies/mL). A negative result  must be combined with clinical observations, patient history, and epidemiological information. The expected result is Negative.  Fact Sheet for Patients:  EntrepreneurPulse.com.au  Fact Sheet for Healthcare Providers:  IncredibleEmployment.be  This test is no t yet approved or cleared by the [place] FDA and  has been authorized for detection and/or diagnosis of SARS-CoV-2 by FDA under an Emergency Use Authorization (EUA). This EUA will remain  in effect (meaning this test can be used) for the duration of the COVID-19 declaration under Section 564(b)(1) of the Act, 21 U.S.C.section 360bbb-3(b)(1), unless the authorization is terminated  or revoked sooner.       Influenza A by PCR NEGATIVE NEGATIVE Final   Influenza B by PCR NEGATIVE NEGATIVE Final    Comment: (NOTE) The Xpert Xpress SARS-CoV-2/FLU/RSV plus assay is intended as an aid in the diagnosis of influenza from Nasopharyngeal swab specimens and should not be used as a sole basis for treatment. Nasal washings and aspirates are unacceptable for Xpert Xpress SARS-CoV-2/FLU/RSV testing.  Fact Sheet for Patients: EntrepreneurPulse.com.au  Fact Sheet for Healthcare Providers: IncredibleEmployment.be  This test is not yet approved or cleared by the [place] FDA and has been authorized for detection and/or diagnosis of SARS-CoV-2 by FDA under an Emergency Use Authorization (EUA). This EUA will remain in effect (meaning this test can be used) for the duration of the COVID-19 declaration under Section 564(b)(1) of the Act, 21 U.S.C. section 360bbb-3(b)(1), unless the authorization is terminated or revoked.  Performed at [facility], [place] [address] [place], [place] [address]     RN Pressure Injury Documentation:     Estimated body mass index is 34.02 kg/m as calculated from the following:   Height as of this encounter:  '5\' 4"'$  (1.626 m).   Weight as of this encounter: 89.9  kg.  Malnutrition Type:   Malnutrition Characteristics:   Nutrition Interventions:    Radiology Studies: CT ABDOMEN PELVIS W CONTRAST  Result Date: 07/12/2021 CLINICAL DATA:  Abdominal pain and distension. Recent G-tube. History of pancreatic cancer. EXAM: CT ABDOMEN AND PELVIS WITH CONTRAST TECHNIQUE: Multidetector CT imaging of the abdomen and pelvis was performed using the standard protocol following bolus administration of intravenous contrast. CONTRAST:  9m OMNIPAQUE IOHEXOL 350 MG/ML SOLN COMPARISON:  Most recent CT 06/26/2021 FINDINGS: Lower chest: Central line tip in the right ventricle. Heart is normal in size. No basilar nodule, focal airspace disease or pleural effusion. Hepatobiliary: Multiple hepatic masses are again seen, less well-defined on the current exam due to phase of contrast. Index lesion in the left lobe measures 7.5 x 5.8 cm, series 2, image 14, previously 8.0 x 6.1 cm. Index lesion in the right lobe measures 7.8 x 4.1 cm, series 2, image 12, previously 7.9 x 4.5 cm. There additional smaller lesions in the right and left hepatic lobe. The gallbladder is surgically absent. There is no intra or extrahepatic biliary ductal dilatation. Pancreas: Ill-defined hypoenhancing pancreatic head mass measures approximately 2.3 x 3.1 cm, series 2, image 38, previously 2.8 x 3.1 cm. Slight pancreatic ductal prominence and parenchymal atrophy are stable. No acute peripancreatic inflammation Spleen: Normal in size without focal abnormality. Splenic vein is patent. Adrenals/Urinary Tract: No adrenal nodule. No hydronephrosis or renal calculi. No focal renal lesion. Unremarkable urinary bladder for degree of distension. Stomach/Bowel: New gastrostomy tube is in place. The balloon is not opposed to the gastric or abdominal wall. There is mild edema and fat stranding along the subcutaneous portion of the gastrostomy tube but no focal fluid  collection. Tip of the gastrostomy tube is in the proximal jejunum. The stomach is decompressed. Fluid/liquid stool in the colon with suggestion of mild colonic wall thickening at the hepatic flexure and proximal transverse, series 2, image 49. There is also wall thickening and mesenteric edema involving pelvic bowel small bowel loops. No pneumatosis, obstruction, or abnormal distention. Normal appendix. Vascular/Lymphatic: Mild aortic atherosclerosis without aneurysm. Patent portal and splenic veins. No mesenteric or portal venous gas. No definite peripancreatic adenopathy. No enlarged lymph nodes in the abdomen or pelvis. Reproductive: Hysterectomy.  Quiescent ovaries.  No adnexal mass. Other: Small volume of free fluid in the pelvis and right greater than left pericolic gutter, likely reactive. No definite omental thickening. No free air. Musculoskeletal: Sclerotic focus within posterior L3 vertebra is unchanged from prior. No acute osseous abnormalities are seen. IMPRESSION: 1. New gastrostomy tube in place. The balloon is within the gastric lumen, not opposed to the gastric or abdominal wall. There is mild edema and fat stranding along the subcutaneous portion of the gastrostomy tube but no focal fluid collection. 2. Fluid/liquid stool in the colon with suggestion of mild colonic wall thickening at the hepatic flexure and proximal transverse. There is also wall thickening and mesenteric edema involving pelvic bowel loops. Findings suspicious for enteritis/colitis. 3. Multiple hepatic masses, slightly decreased in size from prior exam. 4. Pancreatic head mass is unchanged in size from prior exam. 5. Small sclerotic focus within L3 vertebra is nonspecific in the setting. This is unchanged from prior exam. Aortic Atherosclerosis (ICD10-I70.0). Electronically Signed   By: MKeith RakeM.D.   On: 07/12/2021 21:48    Scheduled Meds:  amLODipine  10 mg Per Tube Daily   enoxaparin (LOVENOX) injection  40 mg  Subcutaneous Q24H   mometasone-formoterol  2 puff Inhalation BID  morphine CONCENTRATE  20 mg Per Tube Q4H   pantoprazole (PROTONIX) IV  40 mg Intravenous Q24H   potassium chloride  20 mEq Per Tube BID   sodium chloride flush  10 mL Per Tube Q12H   sucralfate  1 g Per Tube QID   Continuous Infusions:  ceFEPime (MAXIPIME) IV Stopped (07/13/21 1504)   metronidazole 500 mg (07/13/21 1449)   potassium chloride     promethazine (PHENERGAN) injection (IM or IVPB) 6.25 mg (07/13/21 0120)    LOS: 0 days   Kerney Elbe, DO [facility] PAGER is on AMION  If 7PM-7AM, please contact night-coverage [email or web address]

## 2021-07-13 NOTE — ED Notes (Signed)
Patient had no BM

## 2021-07-13 NOTE — ED Notes (Signed)
Pt did not want lunch tray. gingerale provided.

## 2021-07-14 ENCOUNTER — Encounter (HOSPITAL_COMMUNITY): Payer: Self-pay | Admitting: Internal Medicine

## 2021-07-14 ENCOUNTER — Other Ambulatory Visit: Payer: Self-pay

## 2021-07-14 DIAGNOSIS — N390 Urinary tract infection, site not specified: Secondary | ICD-10-CM | POA: Diagnosis present

## 2021-07-14 DIAGNOSIS — B370 Candidal stomatitis: Secondary | ICD-10-CM | POA: Diagnosis present

## 2021-07-14 DIAGNOSIS — G894 Chronic pain syndrome: Secondary | ICD-10-CM | POA: Diagnosis present

## 2021-07-14 DIAGNOSIS — K9423 Gastrostomy malfunction: Secondary | ICD-10-CM | POA: Diagnosis present

## 2021-07-14 DIAGNOSIS — I10 Essential (primary) hypertension: Secondary | ICD-10-CM | POA: Diagnosis present

## 2021-07-14 DIAGNOSIS — C250 Malignant neoplasm of head of pancreas: Secondary | ICD-10-CM | POA: Diagnosis not present

## 2021-07-14 DIAGNOSIS — E7800 Pure hypercholesterolemia, unspecified: Secondary | ICD-10-CM | POA: Diagnosis present

## 2021-07-14 DIAGNOSIS — Z6834 Body mass index (BMI) 34.0-34.9, adult: Secondary | ICD-10-CM | POA: Diagnosis not present

## 2021-07-14 DIAGNOSIS — J45909 Unspecified asthma, uncomplicated: Secondary | ICD-10-CM | POA: Diagnosis present

## 2021-07-14 DIAGNOSIS — L03311 Cellulitis of abdominal wall: Secondary | ICD-10-CM | POA: Diagnosis not present

## 2021-07-14 DIAGNOSIS — Z800 Family history of malignant neoplasm of digestive organs: Secondary | ICD-10-CM | POA: Diagnosis not present

## 2021-07-14 DIAGNOSIS — C787 Secondary malignant neoplasm of liver and intrahepatic bile duct: Secondary | ICD-10-CM | POA: Diagnosis present

## 2021-07-14 DIAGNOSIS — J4540 Moderate persistent asthma, uncomplicated: Secondary | ICD-10-CM | POA: Diagnosis not present

## 2021-07-14 DIAGNOSIS — Z8673 Personal history of transient ischemic attack (TIA), and cerebral infarction without residual deficits: Secondary | ICD-10-CM | POA: Diagnosis not present

## 2021-07-14 DIAGNOSIS — Z87891 Personal history of nicotine dependence: Secondary | ICD-10-CM | POA: Diagnosis not present

## 2021-07-14 DIAGNOSIS — Z8541 Personal history of malignant neoplasm of cervix uteri: Secondary | ICD-10-CM | POA: Diagnosis not present

## 2021-07-14 DIAGNOSIS — R112 Nausea with vomiting, unspecified: Secondary | ICD-10-CM

## 2021-07-14 DIAGNOSIS — E46 Unspecified protein-calorie malnutrition: Secondary | ICD-10-CM | POA: Diagnosis present

## 2021-07-14 DIAGNOSIS — K529 Noninfective gastroenteritis and colitis, unspecified: Secondary | ICD-10-CM | POA: Diagnosis present

## 2021-07-14 DIAGNOSIS — E876 Hypokalemia: Secondary | ICD-10-CM | POA: Diagnosis present

## 2021-07-14 DIAGNOSIS — E039 Hypothyroidism, unspecified: Secondary | ICD-10-CM | POA: Diagnosis present

## 2021-07-14 DIAGNOSIS — Z6837 Body mass index (BMI) 37.0-37.9, adult: Secondary | ICD-10-CM | POA: Diagnosis not present

## 2021-07-14 DIAGNOSIS — D509 Iron deficiency anemia, unspecified: Secondary | ICD-10-CM | POA: Diagnosis present

## 2021-07-14 DIAGNOSIS — Z807 Family history of other malignant neoplasms of lymphoid, hematopoietic and related tissues: Secondary | ICD-10-CM | POA: Diagnosis not present

## 2021-07-14 DIAGNOSIS — E669 Obesity, unspecified: Secondary | ICD-10-CM | POA: Diagnosis present

## 2021-07-14 DIAGNOSIS — Z20822 Contact with and (suspected) exposure to covid-19: Secondary | ICD-10-CM | POA: Diagnosis present

## 2021-07-14 LAB — CBC WITH DIFFERENTIAL/PLATELET
Abs Immature Granulocytes: 0.02 10*3/uL (ref 0.00–0.07)
Basophils Absolute: 0 10*3/uL (ref 0.0–0.1)
Basophils Relative: 1 %
Eosinophils Absolute: 0.3 10*3/uL (ref 0.0–0.5)
Eosinophils Relative: 7 %
HCT: 26.7 % — ABNORMAL LOW (ref 36.0–46.0)
Hemoglobin: 8.5 g/dL — ABNORMAL LOW (ref 12.0–15.0)
Immature Granulocytes: 1 %
Lymphocytes Relative: 21 %
Lymphs Abs: 0.7 10*3/uL (ref 0.7–4.0)
MCH: 30.2 pg (ref 26.0–34.0)
MCHC: 31.8 g/dL (ref 30.0–36.0)
MCV: 95 fL (ref 80.0–100.0)
Monocytes Absolute: 0.3 10*3/uL (ref 0.1–1.0)
Monocytes Relative: 9 %
Neutro Abs: 2.2 10*3/uL (ref 1.7–7.7)
Neutrophils Relative %: 61 %
Platelets: 269 10*3/uL (ref 150–400)
RBC: 2.81 MIL/uL — ABNORMAL LOW (ref 3.87–5.11)
RDW: 15.9 % — ABNORMAL HIGH (ref 11.5–15.5)
WBC: 3.6 10*3/uL — ABNORMAL LOW (ref 4.0–10.5)
nRBC: 0 % (ref 0.0–0.2)

## 2021-07-14 LAB — RETICULOCYTES
Immature Retic Fract: 14.3 % (ref 2.3–15.9)
RBC.: 2.83 MIL/uL — ABNORMAL LOW (ref 3.87–5.11)
Retic Count, Absolute: 48.4 10*3/uL (ref 19.0–186.0)
Retic Ct Pct: 1.7 % (ref 0.4–3.1)

## 2021-07-14 LAB — COMPREHENSIVE METABOLIC PANEL
ALT: 11 U/L (ref 0–44)
AST: 15 U/L (ref 15–41)
Albumin: 2.5 g/dL — ABNORMAL LOW (ref 3.5–5.0)
Alkaline Phosphatase: 64 U/L (ref 38–126)
Anion gap: 13 (ref 5–15)
BUN: 7 mg/dL (ref 6–20)
CO2: 20 mmol/L — ABNORMAL LOW (ref 22–32)
Calcium: 8.4 mg/dL — ABNORMAL LOW (ref 8.9–10.3)
Chloride: 107 mmol/L (ref 98–111)
Creatinine, Ser: 0.61 mg/dL (ref 0.44–1.00)
GFR, Estimated: >60 mL/min (ref >60)
Glucose, Bld: 82 mg/dL (ref 70–99)
Potassium: 2.9 mmol/L — ABNORMAL LOW (ref 3.5–5.1)
Sodium: 140 mmol/L (ref 135–145)
Total Bilirubin: 0.6 mg/dL (ref 0.3–1.2)
Total Protein: 5.1 g/dL — ABNORMAL LOW (ref 6.5–8.1)

## 2021-07-14 LAB — IRON AND TIBC
Iron: 37 ug/dL (ref 28–170)
Saturation Ratios: 24 % (ref 10.4–31.8)
TIBC: 157 ug/dL — ABNORMAL LOW (ref 250–450)
UIBC: 120 ug/dL

## 2021-07-14 LAB — VITAMIN B12: Vitamin B-12: 604 pg/mL (ref 180–914)

## 2021-07-14 LAB — PHOSPHORUS: Phosphorus: 3.7 mg/dL (ref 2.5–4.6)

## 2021-07-14 LAB — FOLATE: Folate: 17.6 ng/mL (ref >5.9)

## 2021-07-14 LAB — URINE CULTURE: Culture: <10000 — AB

## 2021-07-14 LAB — MAGNESIUM: Magnesium: 1.9 mg/dL (ref 1.7–2.4)

## 2021-07-14 LAB — FERRITIN: Ferritin: 186 ng/mL (ref 11–307)

## 2021-07-14 MED ORDER — SCOPOLAMINE 1 MG/3DAYS TD PT72
1.0000 | MEDICATED_PATCH | TRANSDERMAL | Status: DC
  Administered 2021-07-14 – 2021-07-17 (×2): 1.5 mg via TRANSDERMAL
  Filled 2021-07-14 (×2): qty 1

## 2021-07-14 MED ORDER — SODIUM CHLORIDE 0.9 % IV SOLN
2.0000 g | Freq: Every day | INTRAVENOUS | Status: DC
  Administered 2021-07-14 – 2021-07-15 (×2): 2 g via INTRAVENOUS
  Filled 2021-07-14: qty 20
  Filled 2021-07-14: qty 2

## 2021-07-14 MED ORDER — POTASSIUM CHLORIDE 10 MEQ/100ML IV SOLN
10.0000 meq | INTRAVENOUS | Status: AC
Start: 2021-07-14 — End: 2021-07-15
  Administered 2021-07-14 (×4): 10 meq via INTRAVENOUS
  Filled 2021-07-14 (×3): qty 100

## 2021-07-14 MED ORDER — SODIUM CHLORIDE 0.9% FLUSH
10.0000 mL | Freq: Two times a day (BID) | INTRAVENOUS | Status: DC
  Administered 2021-07-14 – 2021-07-18 (×3): 10 mL

## 2021-07-14 MED ORDER — POTASSIUM CHLORIDE 20 MEQ PO PACK
40.0000 meq | PACK | Freq: Two times a day (BID) | ORAL | Status: DC
  Administered 2021-07-14 – 2021-07-18 (×8): 40 meq
  Filled 2021-07-14 (×8): qty 2

## 2021-07-14 MED ORDER — MORPHINE SULFATE (CONCENTRATE) 10 MG/0.5ML PO SOLN
10.0000 mg | ORAL | Status: DC | PRN
Start: 2021-07-14 — End: 2021-07-19
  Administered 2021-07-16 – 2021-07-18 (×6): 20 mg via SUBLINGUAL
  Filled 2021-07-14 (×7): qty 1

## 2021-07-14 MED ORDER — ZOLPIDEM TARTRATE 5 MG PO TABS
5.0000 mg | ORAL_TABLET | Freq: Every evening | ORAL | Status: DC | PRN
  Administered 2021-07-14 – 2021-07-17 (×4): 5 mg
  Filled 2021-07-14 (×4): qty 1

## 2021-07-14 MED ORDER — POTASSIUM CHLORIDE 10 MEQ/100ML IV SOLN
INTRAVENOUS | Status: AC
  Filled 2021-07-14: qty 100

## 2021-07-14 MED ORDER — SODIUM CHLORIDE 0.9% FLUSH: 10.0000 mL | INTRAVENOUS | Status: DC | PRN

## 2021-07-14 MED ORDER — MORPHINE SULFATE ER 30 MG PO TBCR
60.0000 mg | EXTENDED_RELEASE_TABLET | Freq: Two times a day (BID) | ORAL | Status: DC
  Administered 2021-07-14 – 2021-07-15 (×3): 60 mg via ORAL
  Filled 2021-07-14 (×3): qty 2

## 2021-07-14 NOTE — Progress Notes (Signed)
HEMATOLOGY-ONCOLOGY PROGRESS NOTE Wendy Hoffman was discharged from the hospital on 07/07/2021.  She presented to the emergency room yesterday with pain at the G-tube site and nausea/vomiting.  She reports using the J-tube for feedings "twice daily ".  She has not been using the venting gastrostomy.  She has been taking MS Contin for pain.  She has not obtained oxycodone elixir. A CT abdomen/pelvis on 07/12/2021 revealed a G-tube with the balloon in the gastric lumen, wall thickening and mesenteric edema in the pelvic bowel loops is suspicious for enteritis.  Multiple hepatic masses, slightly decreased in size, unchanged pancreas mass. 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  The report date is March 23, 2021.   The Multi-Cancer Panel with pancreatitis genes and preliminary pancreatic cancer genes offered by [facility] includes sequencing and/or deletion duplication testing of the following 91 genes: AIP, ALK, APC, ATM, AXIN2,BAP1,  BARD1, BLM, BMPR1A, BRCA1, BRCA2, BRIP1, CASR, CDC73, CDH1, CDK4, CDKN1B, CDKN1C, CDKN2A (p14ARF), CDKN2A (p16INK4a), CEBPA, CFTR, CHEK2, CPA1, CTNNA1, CTRC, DICER1, DIS3L2, EGFR (c.2369C>T, p.Thr790Met variant only), EPCAM (Deletion/duplication testing only), FANCC, FH, FLCN, GATA2, GPC3, GREM1 (Promoter region deletion/duplication testing only), HOXB13 (c.251G>A, p.Gly84Glu), HRAS, KIT, MAX, MEN1, MET, MITF (c.952G>A, p.Glu318Lys variant only), MLH1, MSH2, MSH3, MSH6, MUTYH,  NBN, NF1, NF2, NTHL1, PALB2, PALLD, PDGFRA, PHOX2B, PMS2, POLD1, POLE, POT1, PRKAR1A, PRSS1, PTCH1, PTEN, RAD50, RAD51C, RAD51D, RB1, RECQL4, RET, RNF43, RUNX1, SDHAF2, SDHA (sequence changes only), SDHB, SDHC, SDHD, SMAD4, SMARCA4, SMARCB1, SMARCE1, SPINK1, STK11, SUFU, TERC, TERT, TMEM127, TP53, TSC1, TSC2, VHL, WRN and WT1.    07/16/2021 -  Chemotherapy    Patient is on Treatment Plan: PANCREATIC ABRAXANE / GEMCITABINE D1,8,15 Q28D       Pancreatic cancer ([place])  02/2021 Initial Diagnosis   Pancreatic cancer ([place])    07/16/2021 -  Chemotherapy    Patient is on Treatment Plan: PANCREATIC ABRAXANE / GEMCITABINE D1,8,15 Q28D        PHYSICAL EXAMINATION:  Vitals:   07/14/21 0031 07/14/21 0427  BP: 114/75 (!) 133/94  Pulse: 87 97  Resp: 16 16  Temp: 98.7 F (37.1 C) 98.7 F (37.1 C)  SpO2: 99% 100%   Intake/Output from previous day: 08/01 0701 - 08/02 0700 In: 598.4 [IV Piggyback:598.4] Out: 700 [Urine:700]  OROPHARYNX: Mild thrush at the left buccal mucosa Lungs: Clear bilaterally Cardiac: Regular rate and rhythm ABDOMEN: Soft, no mass, no hepatomegaly, tender in the mid upper abdomen and surrounding the G-tube site.  G-tube site without erythema  vascular: No leg edema LABORATORY DATA:  I have reviewed the data as listed CMP Latest Ref Rng & Units 07/13/2021 07/12/2021 07/06/2021  Glucose 70 - 99 mg/dL 87 119(H) 101(H)  BUN 6 - 20 mg/dL $Remove'9 9 11  'tApbHDm$ Creatinine 0.44 - 1.00 mg/dL 0.77 0.83 0.62  Sodium 135 - 145 mmol/L 140 140 136  Potassium 3.5 - 5.1 mmol/L 3.0(L) 2.6(LL) 4.0  Chloride 98 - 111 mmol/L 108 100 104  CO2 22 - 32 mmol/L  $'26 27 27  'O$ Calcium 8.9 - 10.3 mg/dL 8.5(L) 9.3 8.3(L)  Total Protein 6.5 - 8.1 g/dL 5.6(L) 6.8 -  Total Bilirubin 0.3 - 1.2 mg/dL 0.6 0.8 -  Alkaline Phos 38 - 126 U/L 77 101 -  AST 15 - 41 U/L 17 20 -  ALT 0 - 44 U/L 11 15 -    Lab Results  Component Value Date   WBC 4.9 07/13/2021   HGB 9.2 (L) 07/13/2021   HCT 28.8 (L) 07/13/2021   MCV  93.5 07/13/2021   PLT 293 07/13/2021   NEUTROABS 3.1 07/13/2021    CT ABDOMEN PELVIS WO CONTRAST  Result Date: 06/17/2021 CLINICAL DATA:  Rule out bowel obstruction. History of pancreas cancer. EXAM: CT ABDOMEN AND PELVIS WITHOUT CONTRAST TECHNIQUE: Multidetector CT imaging of the abdomen and pelvis was performed following the standard protocol without IV contrast. COMPARISON:  05/23/2021 FINDINGS: Lower chest: No acute abnormality. Hepatobiliary: Within a background of diffuse hepatic steatosis there are multiple liver metastases as noted previously: The dominant lesion within the lateral segment of left hepatic lobe measures 7.8 cm, image 26/2. Previously this measured the same. The dominant lesion within the right hepatic lobe measures 8.2 cm, image 25/2. Previously 8 cm. Status post cholecystectomy. No biliary dilatation. Pancreas: Head of pancreas mass is suboptimally evaluated due to lack of IV contrast material. Subjectively, this does not appear significantly changed in size when compared with the previous contrast enhanced MRI. Spleen: Normal in size without focal abnormality. Adrenals/Urinary Tract: Normal adrenal glands. No hydronephrosis identified bilaterally. Punctate stone noted within inferior pole of left kidney. Exophytic cyst is again noted arising off the posterior cortex of the interpolar right kidney measuring 1.3 cm. Urinary bladder appears collapsed. Stomach/Bowel: Stomach is nondistended. The appendix is visualized and appears normal. No pathologic dilatation of the large or small bowel loops. No significant bowel wall thickening or surrounding inflammatory fat stranding. Vascular/Lymphatic: Mild aortic atherosclerosis. No aneurysm. No abdominopelvic adenopathy identified. Reproductive: Status post hysterectomy. No adnexal masses.  The Other: No ascites or focal fluid collections. No peritoneal nodularity identified at this time. Musculoskeletal: No acute or significant osseous  findings. IMPRESSION: 1. No acute findings within the abdomen or pelvis. No evidence for bowel obstruction. 2. Multiple liver metastases.  Similar to previous exam. 3. Head of pancreas mass is suboptimally evaluated due to lack of IV contrast material. Subjectively, this does not appear significantly changed in size when compared with the previous contrast enhanced MRI. 4. Aortic atherosclerosis. Aortic Atherosclerosis (ICD10-I70.0). Electronically Signed   By: Kerby Moors M.D.   On: 06/17/2021 19:07   DG THORACOLUMABAR SPINE  Result Date: 06/28/2021 CLINICAL DATA:  Low back pain for 1 day, history of metastatic pancreatic cancer EXAM: THORACOLUMBAR SPINE 1V COMPARISON:  06/26/2021 FINDINGS: Frontal and lateral views of the thoracolumbar spine are obtained. There are no acute or destructive bony lesions. Mild spondylosis is seen within the mid to lower thoracic spine. Severe facet hypertrophic changes are seen within the mid to lower lumbar spine. Sacroiliac joints are normal. Visualized bowel gas pattern is unremarkable. IMPRESSION: 1. Mild mid to lower thoracic spondylosis, with severe lower lumbar facet hypertrophy. 2. No acute or destructive bony lesions. Electronically Signed   By: Randa Ngo M.D.   On: 06/28/2021 15:22   MR BRAIN W WO CONTRAST  Result Date: 07/05/2021 CLINICAL DATA:  Staging of gastrointestinal small cancer. History of pancreatic carcinoma. EXAM: MRI HEAD WITHOUT AND WITH CONTRAST TECHNIQUE: Multiplanar, multiecho pulse sequences of the  brain and surrounding structures were obtained without and with intravenous contrast. CONTRAST:  47mL GADAVIST GADOBUTROL 1 MMOL/ML IV SOLN COMPARISON:  None. FINDINGS: Brain: No acute infarct, mass effect or extra-axial collection. No acute or chronic hemorrhage. There is multifocal hyperintense T2-weighted signal within the white matter. Parenchymal volume and CSF spaces are normal. A partially empty sella is incidentally noted. There is no  abnormal contrast enhancement. Vascular: Major flow voids are preserved. Skull and upper cervical spine: Normal calvarium and skull base. Visualized upper cervical spine and soft tissues are normal. Sinuses/Orbits:No paranasal sinus fluid levels or advanced mucosal thickening. No mastoid or middle ear effusion. Normal orbits. IMPRESSION: 1. No intracranial metastatic disease. 2. Multifocal hyperintense T2-weighted signal within the white matter, likely due to arteriosclerosis. Electronically Signed   By: Ulyses Jarred M.D.   On: 07/05/2021 19:37   CT ABDOMEN PELVIS W CONTRAST  Result Date: 07/12/2021 CLINICAL DATA:  Abdominal pain and distension. Recent G-tube. History of pancreatic cancer. EXAM: CT ABDOMEN AND PELVIS WITH CONTRAST TECHNIQUE: Multidetector CT imaging of the abdomen and pelvis was performed using the standard protocol following bolus administration of intravenous contrast. CONTRAST:  85mL OMNIPAQUE IOHEXOL 350 MG/ML SOLN COMPARISON:  Most recent CT 06/26/2021 FINDINGS: Lower chest: Central line tip in the right ventricle. Heart is normal in size. No basilar nodule, focal airspace disease or pleural effusion. Hepatobiliary: Multiple hepatic masses are again seen, less well-defined on the current exam due to phase of contrast. Index lesion in the left lobe measures 7.5 x 5.8 cm, series 2, image 14, previously 8.0 x 6.1 cm. Index lesion in the right lobe measures 7.8 x 4.1 cm, series 2, image 12, previously 7.9 x 4.5 cm. There additional smaller lesions in the right and left hepatic lobe. The gallbladder is surgically absent. There is no intra or extrahepatic biliary ductal dilatation. Pancreas: Ill-defined hypoenhancing pancreatic head mass measures approximately 2.3 x 3.1 cm, series 2, image 38, previously 2.8 x 3.1 cm. Slight pancreatic ductal prominence and parenchymal atrophy are stable. No acute peripancreatic inflammation Spleen: Normal in size without focal abnormality. Splenic vein is  patent. Adrenals/Urinary Tract: No adrenal nodule. No hydronephrosis or renal calculi. No focal renal lesion. Unremarkable urinary bladder for degree of distension. Stomach/Bowel: New gastrostomy tube is in place. The balloon is not opposed to the gastric or abdominal wall. There is mild edema and fat stranding along the subcutaneous portion of the gastrostomy tube but no focal fluid collection. Tip of the gastrostomy tube is in the proximal jejunum. The stomach is decompressed. Fluid/liquid stool in the colon with suggestion of mild colonic wall thickening at the hepatic flexure and proximal transverse, series 2, image 49. There is also wall thickening and mesenteric edema involving pelvic bowel small bowel loops. No pneumatosis, obstruction, or abnormal distention. Normal appendix. Vascular/Lymphatic: Mild aortic atherosclerosis without aneurysm. Patent portal and splenic veins. No mesenteric or portal venous gas. No definite peripancreatic adenopathy. No enlarged lymph nodes in the abdomen or pelvis. Reproductive: Hysterectomy.  Quiescent ovaries.  No adnexal mass. Other: Small volume of free fluid in the pelvis and right greater than left pericolic gutter, likely reactive. No definite omental thickening. No free air. Musculoskeletal: Sclerotic focus within posterior L3 vertebra is unchanged from prior. No acute osseous abnormalities are seen. IMPRESSION: 1. New gastrostomy tube in place. The balloon is within the gastric lumen, not opposed to the gastric or abdominal wall. There is mild edema and fat stranding along the subcutaneous portion of the gastrostomy tube but  no focal fluid collection. 2. Fluid/liquid stool in the colon with suggestion of mild colonic wall thickening at the hepatic flexure and proximal transverse. There is also wall thickening and mesenteric edema involving pelvic bowel loops. Findings suspicious for enteritis/colitis. 3. Multiple hepatic masses, slightly decreased in size from prior  exam. 4. Pancreatic head mass is unchanged in size from prior exam. 5. Small sclerotic focus within L3 vertebra is nonspecific in the setting. This is unchanged from prior exam. Aortic Atherosclerosis (ICD10-I70.0). Electronically Signed   By: Keith Rake M.D.   On: 07/12/2021 21:48   CT ABDOMEN PELVIS W CONTRAST  Result Date: 06/26/2021 CLINICAL DATA:  Worsening nausea and vomiting over the last week, pancreatic cancer undergoing chemotherapy EXAM: CT ABDOMEN AND PELVIS WITH CONTRAST TECHNIQUE: Multidetector CT imaging of the abdomen and pelvis was performed using the standard protocol following bolus administration of intravenous contrast. CONTRAST:  63mL OMNIPAQUE IOHEXOL 350 MG/ML SOLN COMPARISON:  06/17/2021, 05/23/2021, 02/16/2021 FINDINGS: Lower chest: No acute pleural or parenchymal lung disease. Central venous catheter tip within the right atrium near the junction with the IVC. No pericardial effusion. Hepatobiliary: Multiple hepatic masses are again identified. Largest lesion within the left lobe liver reference image 27/2 measures 8.0 x 6.1 cm, previously measuring 7.6 x 6.0 cm on MRI 05/23/2021. Largest lesion within the right lobe liver measures up to 8.2 x 4.5 cm on image 23/2, previously measuring 7.6 x 3.7 cm on MRI. Smaller lesions throughout the left lobe liver more inferiorly are grossly unchanged, though less well visualized on CT compared to MRI. No new lesions are definitively identified. No intrahepatic biliary duct dilation. Gallbladder is surgically absent. Pancreas: The ill-defined mass within the pancreatic head measures up to approximately 2.8 x 3.1 cm reference image 65/2, previously having measured 2.9 x 2.9 cm on MRI 05/23/2021. Mild pancreatic duct dilation and upstream pancreatic parenchymal atrophy again noted unchanged. Spleen: No focal splenic abnormalities. Mild splenomegaly unchanged. Adrenals/Urinary Tract: No urinary tract calculi or obstructive uropathy within either  kidney. The kidneys enhance normally and symmetrically. Small left renal cortical cysts unchanged. The adrenals are unremarkable. Bladder is only minimally distended with no gross abnormalities. Stomach/Bowel: No bowel obstruction or ileus. Normal retrocecal appendix. No bowel wall thickening or inflammatory change. Vascular/Lymphatic: Mild atherosclerosis of the aorta and iliac vessels unchanged. No discrete adenopathy within the abdomen or pelvis. Reproductive: Status post hysterectomy. No adnexal masses. Other: Trace pelvic free fluid. No free intraperitoneal gas. No abdominal wall hernia. Musculoskeletal: No acute or destructive bony lesions. Reconstructed images demonstrate no additional findings. IMPRESSION: 1. Grossly stable mass within the pancreatic head compatible with known pancreatic cancer. 2. No significant change in the size and number of known liver metastases, allowing for differences in technique compared to previous MRI. 3. Trace pelvic free fluid. 4.  Aortic Atherosclerosis (ICD10-I70.0). Electronically Signed   By: Randa Ngo M.D.   On: 06/26/2021 17:01   IR GASTROSTOMY TUBE MOD SED  Result Date: 07/01/2021 INDICATION: 55 year old woman with history metastatic pancreatic cancer presents to interventional radiology for Hutchinson tube placement for treatment of nausea and vomiting likely related to compression of the stomach from hepatic metastases. EXAM: Fluoroscopy guided GJ tube placement MEDICATIONS: Vancomycin 1 gm IV; Antibiotics were administered within 1 hour of the procedure. Glucagon 1 mg IV ANESTHESIA/SEDATION: Versed 3 mg IV; Fentanyl 150 mcg IV Moderate Sedation Time:  63 minutes The patient was continuously monitored during the procedure by the interventional radiology nurse under my direct supervision. CONTRAST:  10  mL of Omnipaque 300-administered into the gastric lumen. FLUOROSCOPY TIME:  Fluoroscopy Time: 8 minutes 12 seconds (229 mGy). COMPLICATIONS: None immediate. PROCEDURE:  Informed written consent was obtained from the patient after a thorough discussion of the procedural risks, benefits and alternatives. All questions were addressed. Maximal Sterile Barrier Technique was utilized including caps, mask, sterile gowns, sterile gloves, sterile drape, hand hygiene and skin antiseptic. A timeout was performed prior to the initiation of the procedure. The inferior margin of the liver was marked utilizing ultrasound guidance. An orogastric tube was placed with fluoroscopic guidance. The anterior abdomen was prepped and draped in sterile fashion. Ultrasound evaluation of the left upper quadrant was performed to confirm the position of the liver. The skin and subcutaneous tissues were anesthetized with 1% lidocaine. Single gastropexy was placed utilizing fluoroscopic guidance. 19 gauge needle was directed into the distended stomach with fluoroscopic guidance. A wire was advanced into the stomach. 9-French vascular sheath was placed and the orogastric tube was snared using a Gooseneck snare device. Guidewire was advanced through the orogastric tube and snared. The snare and wire were pulled out of the patient's mouth. The snare device was connected to a 20-French gastrostomy tube. The snare device and gastrostomy tube were pulled through the patient's mouth and out the anterior abdominal wall. The gastrostomy tube was cut to an appropriate length. Contrast injection through gastrostomy tube confirmed placement within the stomach. Kumpe catheter and Glidewire advanced through the gastrostomy tube to the proximal jejunum. 9 French jejunostomy tube was inserted through the G-tube with tip positioned in the proximal jejunum. Position of the J limb was conformed by administering contrast under fluoroscopy. The gastrojejunostomy tube was flushed with normal saline. IMPRESSION: Gastrojejunostomy tube placed utilizing fluoroscopic guidance. The gastrostomy tube is 72 [place] while the jejunostomy tube  is 53 [place]. Electronically Signed   By: Miachel Roux M.D.   On: 07/01/2021 16:41   VAS [place] LOWER EXTREMITY VENOUS (DVT)  Result Date: 06/29/2021  Lower Venous DVT Study Patient Name:  Wendy Hoffman  Date of Exam:   06/29/2021 Medical Rec #: [id number]        Accession #:    [id number] Date of Birth: 1966/11/22        Patient Gender: F Patient Age:   89W Exam Location:  [facility] Procedure:      VAS [place] LOWER EXTREMITY VENOUS (DVT) Referring Phys: [place] --------------------------------------------------------------------------------  Indications: RLE swelling.  Risk Factors: Chemotherapy Cancer Pancreatic w/ mets. Limitations: Poor ultrasound/tissue interface. Comparison Study: No previous exams Performing Technologist: Jody Hill RVT, RDMS  Examination Guidelines: A complete evaluation includes B-mode imaging, spectral Doppler, color Doppler, and power Doppler as needed of all accessible portions of each vessel. Bilateral testing is considered an integral part of a complete examination. Limited examinations for reoccurring indications may be performed as noted. The reflux portion of the exam is performed with the patient in reverse Trendelenburg.  +---------+---------------+---------+-----------+----------+--------------+ RIGHT    CompressibilityPhasicitySpontaneityPropertiesThrombus Aging +---------+---------------+---------+-----------+----------+--------------+ CFV      Full           Yes      Yes                                 +---------+---------------+---------+-----------+----------+--------------+ SFJ      Full                                                        +---------+---------------+---------+-----------+----------+--------------+  FV Prox  Full           Yes      Yes                                 +---------+---------------+---------+-----------+----------+--------------+ FV Mid   Full           Yes      Yes                                  +---------+---------------+---------+-----------+----------+--------------+ FV DistalFull           Yes      Yes                                 +---------+---------------+---------+-----------+----------+--------------+ PFV      Full                                                        +---------+---------------+---------+-----------+----------+--------------+ POP      Full           Yes      Yes                                 +---------+---------------+---------+-----------+----------+--------------+ PTV      Full                                                        +---------+---------------+---------+-----------+----------+--------------+ PERO     Full                                                        +---------+---------------+---------+-----------+----------+--------------+   +---------+---------------+---------+-----------+----------+--------------+ LEFT     CompressibilityPhasicitySpontaneityPropertiesThrombus Aging +---------+---------------+---------+-----------+----------+--------------+ CFV      Full           Yes      Yes                                 +---------+---------------+---------+-----------+----------+--------------+ SFJ      Full                                                        +---------+---------------+---------+-----------+----------+--------------+ FV Prox  Full           Yes      Yes                                 +---------+---------------+---------+-----------+----------+--------------+ FV Mid   Full  Yes      Yes                                 +---------+---------------+---------+-----------+----------+--------------+ FV DistalFull           Yes      Yes                                 +---------+---------------+---------+-----------+----------+--------------+ PFV      Full                                                         +---------+---------------+---------+-----------+----------+--------------+ POP      Full           Yes      Yes                                 +---------+---------------+---------+-----------+----------+--------------+ PTV      Full                                                        +---------+---------------+---------+-----------+----------+--------------+ PERO     Full                                                        +---------+---------------+---------+-----------+----------+--------------+     Summary: BILATERAL: - No evidence of deep vein thrombosis seen in the lower extremities, bilaterally. - No evidence of superficial venous thrombosis in the lower extremities, bilaterally. -No evidence of popliteal cyst, bilaterally. RIGHT: - Ultrasound characteristics of enlarged lymph nodes are noted in the groin.  LEFT: - Ultrasound characteristics of enlarged lymph nodes noted in the groin.  *See table(s) above for measurements and observations. Electronically signed by Ruta Hinds MD on 06/29/2021 at 7:52:11 PM.    Final     ASSESSMENT AND PLAN: 1. Pancreas cancer -CT abdomen/pelvis without contrast 02/16/2021-multiple large hypoechoic masses in the patient's liver consistent metastatic disease, ill-defined masslike area in the pancreatic head measuring approximate 4.7 cm, possible filling defect in the right ventricle. -EGD 02/19/2021-large infiltrative mass with bleeding in the second portion of the duodenum, appears to be arising near the ampulla, partially obstructive with friable mucosa.  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 -Flexible sigmoidoscopy 02/19/2021-diverticulosis in the sigmoid colon, nonbleeding external and internal hemorrhoids -Cardiac CT 02/20/2021-mass in the mid RV attached to the interventricular septum measuring 16 mm x 6 mm and concerning for metastatic tumor -MRI abdomen with/without contrast 02/21/2021-mass in the  posterior pancreatic head measuring 4.3 x 3.8 cm with obstruction of the central pancreatic duct and diffuse dilatation up to 6 mm consistent with pancreatic adenocarcinoma, liver lesions the largest mass of the central left lobe measuring 9.9 x 9.2 cm consistent with hepatic metastatic disease,  hepatomegaly. -Ultrasound-guided biopsy of a left liver lesion 02/24/2021-adenocarcinoma, morphologically  similar to the duodenal biopsy; microsatellite stable, tumor mutation burden 1 -Negative genetic testing -Palliative radiation to the duodenal mass 02/25/2021-03/06/2021 -Cycle 1 FOLFOX 03/17/2021 -Cycle 2 FOLFOX 04/01/2021 -Cycle 3 FOLFIRINOX 04/14/2021 -Cycle 4 FOLFIRINOX 04/27/2021, Udenyca added -Cycle 5 FOLFIRINOX 05/12/2021, Udenyca -MRI abdomen 05/23/2021-decrease in size of pancreas mass and hepatic lesions, no progressive disease -Cycle 6 FOLFIRINOX 05/27/2021 2.  Iron deficiency anemia-improved -Feraheme 510 mg 02/20/2021 3.  Protein calorie malnutrition 4.  Possible right ventricular filling defect on noncontrast CT scan MRI cardiac morphology 02/21/2021-mass in the mid RV attached to the interventricular septum measures 16 mm x 6 mm.  Mass appears heterogeneous with area of enhancement on LGE imaging.  Mass is concerning for metastatic tumor.  Normal LV size and systolic function.  Normal RV size and systolic function. 5.  Hypertension 6.  History of CVA 7.  Hyperlipidemia 8.  Hypothyroidism 9.  Morbid obesity 10.  Asthma 11.  Migraines 12.  Pain secondary to #1 13.  Port-A-Cath placement 02/25/2021 14.  Hypokalemia 04/01/2021-started a potassium supplement 15.  Hospital admission 06/17/2021-intractable nausea and vomiting EGD 06/19/2021-diffuse gastritis, nonobstructing duodenal mass, biopsy of stomach-no malignancy or H. pylori, duodenal mass-adenocarcinoma 16.  Hospital admission 07/12/2021 with nausea/vomiting, abdominal pain, and hypokalemia  Ms. Yzaguirre is readmitted with abdominal pain and  nausea/vomiting.  Her pain is most likely related to pancreas cancer.  She reports the G-tube site has been "leaking ".  The balloon is in the gastric lumen.  We can consult interventional radiology to evaluate the G-tube site.  It is unclear whether she has been using the J-tube correctly for feedings.  She has not been using the venting gastrostomy for nausea.  Recommendations: 1.  Consult interventional radiology to assess G-J-tube site and balloon 2.  Nutrition consult to resume tube feedings, teaching for home management of tube feedings 3.  Open G-tube to gravity, instruct patient on use of tube for venting 4.  Resume MS Contin, change Roxanol to as needed 5.  She is scheduled to begin gemcitabine/Abraxane later this week.  Chemotherapy will be rescheduled.   LOS: 0 days   Betsy Coder, MD 07/14/21

## 2021-07-14 NOTE — Progress Notes (Signed)
Attempted DBIV from implanted port. Blood return sluggish and unable to obtain enough for labs. Pt requested to hold off on venipuncture and have 2nd assess of line by day VAST RN. RN notified.

## 2021-07-14 NOTE — Progress Notes (Addendum)
Patient ID: Wendy Hoffman, female   DOB: [birth year], 55 y.o.   MRN: [id number] Pt's GJ tube evaluated today; CT revealed that inner bumper was not completely against abd wall; on exam outer disc was not taut to skin surface which can lead to leaking ; the bumper was subsequently  pulled up against the abd wall and disc made taut to skin surface. 1 layer of drain sponge gauze was placed under disc to prevent skin irritation. Both ports were flushed without difficulty. Nurse notified. Ok to use tube.

## 2021-07-14 NOTE — Progress Notes (Signed)
PROGRESS NOTE    Wendy Hoffman  [id number] DOB: 12-25-1965 DOA: 07/12/2021 PCP: Patient, No Pcp Per (Inactive)   Brief Narrative:  The patient is a 55 year old obese African-American female with a past medical history significant for but not limited to pancreatic cancer, hypertension, asthma, history of protein calorie malnutrition, recent hospital admission where she ended up having a G-tube placement by IR on 07/01/2021 and started on tube feedings and subsequently discharged on July 07, 2021.  She presents to the hospital today for recurrent nausea, nonbloody vomiting as well as diarrhea and increasing pain around her tube site.  She is reported that she has been tolerating her tube feedings at home but then developed pain around her tube site and started having nausea, vomiting with inability to take anything by mouth and had multiple loose stools daily.  She also noted some thick yellow drainage around the tube site but denies any fevers or chills.  Upon arrival to the ED she is found to be afebrile and saturating well on room air but she was slightly tachycardic and had a stable blood pressure.  She was noted to be hypokalemic and CBC did show a normocytic anemia.  CT of the abdomen pelvis done and demonstrated a new gastrostomy tube with mild edema and stranding around the subcutaneous portion of the tube without fluid collection.  CT was also notable for liquid stool in the colon with mild colonic wall thickening suggestive of colitis.  In the ED she is given a liter of normal saline and started on antibiotics with IV cefepime, IV Flagyl and given IV potassium as well as pain control with IV Dilaudid.  Antibiotics were de-escalated down to IV ceftriaxone.  Patient continues to have nausea and vomiting but diarrhea is improved.  Interventional radiology was consulted to assess the Sadler tube and balloon and it was corrected by interventional radiology PA.  Nutritionist was consulted to resume her  tube feedings and her pain medications were resumed.  Her diarrhea is resolved now.  Assessment & Plan:   Principal Problem:   Intractable nausea and vomiting Active Problems:   Asthma   Malignant neoplasm of head of pancreas (HCC)   Hypokalemia   UTI (urinary tract infection)   Colitis   Cellulitis  Intractable N/V, persistent -Recurring problem, no obstruction on CT  -CT Scan showed "New gastrostomy tube in place. The balloon is within the gastric lumen, not opposed to the gastric or abdominal wall. There is mild edema and fat stranding along the subcutaneous portion of the gastrostomy tube but no focal fluid collection. Fluid/liquid stool in the colon with suggestion of mild colonic  wall thickening at the hepatic flexure and proximal transverse. There is also wall thickening and mesenteric edema involving pelvic bowel loops. Findings suspicious for enteritis/colitis. Multiple hepatic masses, slightly decreased in size from prior exam. Pancreatic head mass is unchanged in size from prior exam. Small sclerotic focus within L3 vertebra is nonspecific in the setting. This is unchanged from prior exam." -Held her tube feedings but now we will resume that her G-tube is now back in place -Continue IVF and reduced the rate from 100 mL/hr to 75 mL/hr; Received a 1 Liter NS bolus in the ED -C/w IV antiemetics with po/IV Ondansetron 4 mg q6hprn Nausea and c/w IV Promethazine 6.25 mg q6hprn for Refractory Vomiting  -Resume tube feeds and advance oral diet and she is continuing on a clear liquid diet given her continued nausea -We will add scopolamine patch for  her nausea -Medical oncology recommending continuing her G-tube to gravity and instructing the patient of her tube for venting   Acute Colitis  -She reports 2 days of diarrhea and CT findings suggestive of possible colitis  -Started on IV cefepime and Flagyl in ED and will continue for now and de-escalate to IV ceftriaxone -Check GI  pathogen panel and place on Contact Precautions however she did not have a bowel movement overnight to this is consult -Pain control with IV Hydromorphone 1 mg q3hprn Severe Pain  -Diet advancement slowly   Cellulitis  -Cellulitis noted about the GJ tube; no fluid-collection on CT as noted as above -Continued cefepime as above and transition to IV ceftriaxone, transition to oral as tolerated   -Continue to Monitor closely -She is afebrile and has no Leukocytosis    Pancreatic cancer  -Due to start cycle one of Gemcitabine/Abraxane this coming week  -No change noted on CT in ED   -Will notify her Primary Oncologist Dr. Benay Spice about admission and he is present and consulted interventional radiology to assess her JG tube site and balloon and recommending resuming her tube feedings and opening G-tube to gravity drainage used for venting   Chronic pain  -She has been unable to keep her long-acting morphine down d/t N/V and reports uncontrolled pain in ED  -Try Morphine 20 mg per Tube q4h, as-needed parenteral analgesia  -C/w IV Hydromorphone 1 mg q3hprn  -Oncology changing Roxanol to as needed   Asthma  -No cough or wheezing on admission  -Continue ICS/LABA with Dulera 2 puff IH BID and Albuterol 3 mL IH q6hprn Wheezing and SOB  Hypokalemia -K+ is now 2.9 -Replete with IV Kcl 40 mEQ x1 again -Also repleting with po Kcl 20 mEQ per Tube BID but increase to 40 mEQ BID per Tube -Mag Level was 1.8 and will also replete with IV Mag Sulfate 2 grams -Continue to Monitor and Replete as Necessary  -Repeat CMP in the AM   GERD -C/w Pantoprazole 40 mg IV q24h  Hypertension -C/w Amlodipine 10 mg per Tube Daily -Continue to Monitor BP per Protocol -Last BP was 123/65  ? UTI, poA -Urinalysis on admission showed cloudy appearance with amber color urine, 5 ketones, large leukocytes, negative nitrites, many bacteria, 11-20 RBCs per high-powered field, 21-50 squamous epithelial cells, greater  than 50 WBCs with urine culture showing <10,000 CFU of Insignificant Growth  -Likely this is a bad sample given the amount of squamous epithelial cells but she is already on antibiotics with IV cefepime and Flagyl as above -Continue monitor to see if urine culture will grow anything  Normocytic Anemia -Patient's Hgb/Hct went from 11.3/34.9 -> 9.2/28.8 -> 8.5/26.7 -Checked Anemia Panel and showed an iron level of 37, U IBC of 120, TIBC of 157, saturation ratios of 24%, ferritin level 186, folate level 17.6, vitamin B12 level 604 -Continue to Monitor for S/Sx of Bleeding; No overt Bleeding noted -Repeat CBC in the AM   Obesity -Complicates overall prognosis and care -Estimated body mass index is 34.02 kg/m as calculated from the following:   Height as of this encounter: '5\' 4"'$  (1.626 m).   Weight as of this encounter: 89.9 kg. -Weight Loss and Dietary Counseling given    DVT prophylaxis: Enoxaparin 40 mg sq q24h Code Status: FULL CODE Family Communication: No family present at bedside Disposition Plan: Pending further clinical improvement and tolerance of her diet and improved back to her baseline  Status is: Inpatient   The patient  will require care spanning > 2 midnights and should be moved to inpatient because: Unsafe d/c plan, IV treatments appropriate due to intensity of illness or inability to take PO, and Inpatient level of care appropriate due to severity of illness  Dispo: The patient is from: Home              Anticipated d/c is to: Home              Patient currently is not medically stable to d/c.   Difficult to place patient No  Consultants:  Medical Oncology Interventional Radiology   Procedures: None  Antimicrobials:  Anti-infectives (From admission, onward)    Start     Dose/Rate Route Frequency Ordered Stop   07/14/21 1400  cefTRIAXone (ROCEPHIN) 2 g in sodium chloride 0.9 % 100 mL IVPB        2 g 200 mL/hr over 30 Minutes Intravenous Daily 07/14/21 1029      07/13/21 0600  ceFEPIme (MAXIPIME) 2 g in sodium chloride 0.9 % 100 mL IVPB  Status:  Discontinued        2 g 200 mL/hr over 30 Minutes Intravenous Every 8 hours 07/12/21 2251 07/14/21 1028   07/12/21 2200  metroNIDAZOLE (FLAGYL) IVPB 500 mg        500 mg 100 mL/hr over 60 Minutes Intravenous Every 8 hours 07/12/21 2152     07/12/21 2045  ceFEPIme (MAXIPIME) 2 g in sodium chloride 0.9 % 100 mL IVPB        2 g 200 mL/hr over 30 Minutes Intravenous  Once 07/12/21 2037 07/12/21 2200        Subjective: Seen and examined at bedside and states that she is not feeling much better and continues to be significantly nauseous and started vomiting earlier but states her diarrhea has improved.  Continues to have some abdominal pain.  No chest pain or shortness breath.  No other concerns or complaints this time and feels fatigued.  Objective: Vitals:   07/13/21 2032 07/14/21 0031 07/14/21 0427 07/14/21 1251  BP: 111/70 114/75 (!) 133/94 123/65  Pulse: 87 87 97 86  Resp: '18 16 16 16  '$ Temp: 98.1 F (36.7 C) 98.7 F (37.1 C) 98.7 F (37.1 C) 99.3 F (37.4 C)  TempSrc: Oral Oral Oral Oral  SpO2: 95% 99% 100% 99%  Weight:      Height:        Intake/Output Summary (Last 24 hours) at 07/14/2021 1651 Last data filed at 07/14/2021 1500 Gross per 24 hour  Intake 1786.09 ml  Output 1250 ml  Net 536.09 ml    Filed Weights   07/13/21 0139  Weight: 89.9 kg   Examination: Physical Exam:  Constitutional: WN/WD obese African-American female currently in no acute distress appears calm but does appear uncomfortable  Eyes: Lids and conjunctivae normal, sclerae anicteric  ENMT: External Ears, Nose appear normal. Grossly normal hearing.  Neck: Appears normal, supple, no cervical masses, normal ROM, no appreciable thyromegaly; no JVD Respiratory: Diminished to auscultation bilaterally, no wheezing, rales, rhonchi or crackles. Normal respiratory effort and patient is not tachypenic. No accessory muscle  use.  Unlabored breathing Cardiovascular: RRR, no murmurs / rubs / gallops. S1 and S2 auscultated.  Has mild 1+ extremity edema Abdomen: Soft, tender to palpate, distended secondary body habitus.  G-tube in place with some mild drainage on the dressing.. Bowel sounds positive.  GU: Deferred. Musculoskeletal: No clubbing / cyanosis of digits/nails. No joint deformity upper and lower extremities.  Skin: No rashes, lesions, ulcers on limited skin evaluation. No induration; Warm and dry.  Neurologic: CN 2-12 grossly intact with no focal deficits. Romberg sign and cerebellar reflexes not assessed.  Psychiatric: Normal judgment and insight. Alert and oriented x 3. Normal mood and appropriate affect.   Data Reviewed: I have personally reviewed following labs and imaging studies  CBC: Recent Labs  Lab 07/12/21 1922 07/13/21 1000 07/14/21 0500  WBC 8.5 4.9 3.6*  NEUTROABS 7.2 3.1 2.2  HGB 11.3* 9.2* 8.5*  HCT 34.9* 28.8* 26.7*  MCV 92.1 93.5 95.0  PLT 394 293 [id number]    Basic Metabolic Panel: Recent Labs  Lab 07/12/21 1922 07/13/21 1000 07/14/21 0500  NA 140 140 140  K 2.6* 3.0* 2.9*  CL 100 108 107  CO2 27 26 20*  GLUCOSE 119* 87 82  BUN '9 9 7  '$ CREATININE 0.83 0.77 0.61  CALCIUM 9.3 8.5* 8.4*  MG  --  1.8 1.9  PHOS  --  3.7 3.7    GFR: Estimated Creatinine Clearance: 88.3 mL/min (by C-G formula based on SCr of 0.61 mg/dL). Liver Function Tests: Recent Labs  Lab 07/12/21 1922 07/13/21 1000 07/14/21 0500  AST '20 17 15  '$ ALT '15 11 11  '$ ALKPHOS 101 77 64  BILITOT 0.8 0.6 0.6  PROT 6.8 5.6* 5.1*  ALBUMIN 3.3* 2.8* 2.5*    Recent Labs  Lab 07/12/21 1922  LIPASE 24    No results for input(s): AMMONIA in the last 168 hours. Coagulation Profile: No results for input(s): INR, PROTIME in the last 168 hours. Cardiac Enzymes: No results for input(s): CKTOTAL, CKMB, CKMBINDEX, TROPONINI in the last 168 hours. BNP (last 3 results) No results for input(s): PROBNP in the last  8760 hours. HbA1C: No results for input(s): HGBA1C in the last 72 hours. CBG: No results for input(s): GLUCAP in the last 168 hours.  Lipid Profile: No results for input(s): CHOL, HDL, LDLCALC, TRIG, CHOLHDL, LDLDIRECT in the last 72 hours. Thyroid Function Tests: No results for input(s): TSH, T4TOTAL, FREET4, T3FREE, THYROIDAB in the last 72 hours. Anemia Panel: Recent Labs    07/14/21 0803  VITAMINB12 604  FOLATE 17.6  FERRITIN 186  TIBC 157*  IRON 37  RETICCTPCT 1.7   Sepsis Labs: No results for input(s): PROCALCITON, LATICACIDVEN in the last 168 hours.  Recent Results (from the past 240 hour(s))  Urine Culture     Status: Abnormal   Collection Time: 07/12/21  7:22 PM   Specimen: Urine, Clean Catch  Result Value Ref Range Status   Specimen Description   Final    URINE, CLEAN CATCH Performed at [facility], [place] [address] [place], [place] [address]    Special Requests   Final    Immunocompromised Performed at [facility], [place] [address], [place], [place] [address]    Culture (A)  Final    <10,000 COLONIES/mL INSIGNIFICANT GROWTH Performed at [place] [address] [place], [place] [address]    Report Status 07/14/2021 FINAL  Final  Resp Panel by RT-PCR (Flu A&B, Covid) Nasopharyngeal Swab     Status: None   Collection Time: 07/12/21  9:55 PM   Specimen: Nasopharyngeal Swab; Nasopharyngeal(NP) swabs in vial transport medium  Result Value Ref Range Status   SARS Coronavirus 2 by RT PCR NEGATIVE NEGATIVE Final    Comment: (NOTE) SARS-CoV-2 target nucleic acids are NOT DETECTED.  The SARS-CoV-2 RNA is generally detectable in upper respiratory specimens during  the acute phase of infection. The lowest concentration of SARS-CoV-2 viral copies this assay can detect is 138 copies/mL. A negative result does not preclude SARS-Cov-2 infection and should not be used as the sole basis for treatment or other patient  management decisions. A negative result may occur with  improper specimen collection/handling, submission of specimen other than nasopharyngeal swab, presence of viral mutation(s) within the areas targeted by this assay, and inadequate number of viral copies(<138 copies/mL). A negative result must be combined with clinical observations, patient history, and epidemiological information. The expected result is Negative.  Fact Sheet for Patients:  EntrepreneurPulse.com.au  Fact Sheet for Healthcare Providers:  IncredibleEmployment.be  This test is no t yet approved or cleared by the [place] FDA and  has been authorized for detection and/or diagnosis of SARS-CoV-2 by FDA under an Emergency Use Authorization (EUA). This EUA will remain  in effect (meaning this test can be used) for the duration of the COVID-19 declaration under Section 564(b)(1) of the Act, 21 U.S.C.section 360bbb-3(b)(1), unless the authorization is terminated  or revoked sooner.       Influenza A by PCR NEGATIVE NEGATIVE Final   Influenza B by PCR NEGATIVE NEGATIVE Final    Comment: (NOTE) The Xpert Xpress SARS-CoV-2/FLU/RSV plus assay is intended as an aid in the diagnosis of influenza from Nasopharyngeal swab specimens and should not be used as a sole basis for treatment. Nasal washings and aspirates are unacceptable for Xpert Xpress SARS-CoV-2/FLU/RSV testing.  Fact Sheet for Patients: EntrepreneurPulse.com.au  Fact Sheet for Healthcare Providers: IncredibleEmployment.be  This test is not yet approved or cleared by the [place] FDA and has been authorized for detection and/or diagnosis of SARS-CoV-2 by FDA under an Emergency Use Authorization (EUA). This EUA will remain in effect (meaning this test can be used) for the duration of the COVID-19 declaration under Section 564(b)(1) of the Act, 21 U.S.C. section 360bbb-3(b)(1),  unless the authorization is terminated or revoked.  Performed at [facility], [place] [address], [place], [place] [address]      RN Pressure Injury Documentation:     Estimated body mass index is 34.02 kg/m as calculated from the following:   Height as of this encounter: '5\' 4"'$  (1.626 m).   Weight as of this encounter: 89.9 kg.  Malnutrition Type:   Malnutrition Characteristics:   Nutrition Interventions:    Radiology Studies: CT ABDOMEN PELVIS W CONTRAST  Result Date: 07/12/2021 CLINICAL DATA:  Abdominal pain and distension. Recent G-tube. History of pancreatic cancer. EXAM: CT ABDOMEN AND PELVIS WITH CONTRAST TECHNIQUE: Multidetector CT imaging of the abdomen and pelvis was performed using the standard protocol following bolus administration of intravenous contrast. CONTRAST:  35m OMNIPAQUE IOHEXOL 350 MG/ML SOLN COMPARISON:  Most recent CT 06/26/2021 FINDINGS: Lower chest: Central line tip in the right ventricle. Heart is normal in size. No basilar nodule, focal airspace disease or pleural effusion. Hepatobiliary: Multiple hepatic masses are again seen, less well-defined on the current exam due to phase of contrast. Index lesion in the left lobe measures 7.5 x 5.8 cm, series 2, image 14, previously 8.0 x 6.1 cm. Index lesion in the right lobe measures 7.8 x 4.1 cm, series 2, image 12, previously 7.9 x 4.5 cm. There additional smaller lesions in the right and left hepatic lobe. The gallbladder is surgically absent. There is no intra or extrahepatic biliary ductal dilatation. Pancreas: Ill-defined hypoenhancing pancreatic head mass measures approximately 2.3 x 3.1 cm, series 2, image 38, previously 2.8  x 3.1 cm. Slight pancreatic ductal prominence and parenchymal atrophy are stable. No acute peripancreatic inflammation Spleen: Normal in size without focal abnormality. Splenic vein is patent. Adrenals/Urinary Tract: No adrenal nodule. No hydronephrosis or renal calculi. No  focal renal lesion. Unremarkable urinary bladder for degree of distension. Stomach/Bowel: New gastrostomy tube is in place. The balloon is not opposed to the gastric or abdominal wall. There is mild edema and fat stranding along the subcutaneous portion of the gastrostomy tube but no focal fluid collection. Tip of the gastrostomy tube is in the proximal jejunum. The stomach is decompressed. Fluid/liquid stool in the colon with suggestion of mild colonic wall thickening at the hepatic flexure and proximal transverse, series 2, image 49. There is also wall thickening and mesenteric edema involving pelvic bowel small bowel loops. No pneumatosis, obstruction, or abnormal distention. Normal appendix. Vascular/Lymphatic: Mild aortic atherosclerosis without aneurysm. Patent portal and splenic veins. No mesenteric or portal venous gas. No definite peripancreatic adenopathy. No enlarged lymph nodes in the abdomen or pelvis. Reproductive: Hysterectomy.  Quiescent ovaries.  No adnexal mass. Other: Small volume of free fluid in the pelvis and right greater than left pericolic gutter, likely reactive. No definite omental thickening. No free air. Musculoskeletal: Sclerotic focus within posterior L3 vertebra is unchanged from prior. No acute osseous abnormalities are seen. IMPRESSION: 1. New gastrostomy tube in place. The balloon is within the gastric lumen, not opposed to the gastric or abdominal wall. There is mild edema and fat stranding along the subcutaneous portion of the gastrostomy tube but no focal fluid collection. 2. Fluid/liquid stool in the colon with suggestion of mild colonic wall thickening at the hepatic flexure and proximal transverse. There is also wall thickening and mesenteric edema involving pelvic bowel loops. Findings suspicious for enteritis/colitis. 3. Multiple hepatic masses, slightly decreased in size from prior exam. 4. Pancreatic head mass is unchanged in size from prior exam. 5. Small sclerotic focus  within L3 vertebra is nonspecific in the setting. This is unchanged from prior exam. Aortic Atherosclerosis (ICD10-I70.0). Electronically Signed   By: Keith Rake M.D.   On: 07/12/2021 21:48    Scheduled Meds:  amLODipine  10 mg Per Tube Daily   Chlorhexidine Gluconate Cloth  6 each Topical Daily   enoxaparin (LOVENOX) injection  40 mg Subcutaneous Q24H   mometasone-formoterol  2 puff Inhalation BID   morphine  60 mg Oral Q12H   pantoprazole (PROTONIX) IV  40 mg Intravenous Q24H   potassium chloride  20 mEq Per Tube BID   scopolamine  1 patch Transdermal Q72H   sodium chloride flush  10-40 mL Intracatheter Q12H   sodium chloride flush  10 mL Per Tube Q12H   sucralfate  1 g Per Tube QID   Continuous Infusions:  cefTRIAXone (ROCEPHIN)  IV 2 g (07/14/21 1251)   metronidazole 500 mg (07/14/21 1334)   promethazine (PHENERGAN) injection (IM or IVPB) 6.25 mg (07/13/21 0120)    LOS: 0 days   Kerney Elbe, DO [facility] PAGER is on AMION  If 7PM-7AM, please contact night-coverage [email or web address]

## 2021-07-15 DIAGNOSIS — R112 Nausea with vomiting, unspecified: Secondary | ICD-10-CM | POA: Diagnosis not present

## 2021-07-15 LAB — CBC WITH DIFFERENTIAL/PLATELET
Abs Immature Granulocytes: 0.01 10*3/uL (ref 0.00–0.07)
Basophils Absolute: 0 10*3/uL (ref 0.0–0.1)
Basophils Relative: 1 %
Eosinophils Absolute: 0.3 10*3/uL (ref 0.0–0.5)
Eosinophils Relative: 8 %
HCT: 25.5 % — ABNORMAL LOW (ref 36.0–46.0)
Hemoglobin: 8.2 g/dL — ABNORMAL LOW (ref 12.0–15.0)
Immature Granulocytes: 0 %
Lymphocytes Relative: 26 %
Lymphs Abs: 0.9 10*3/uL (ref 0.7–4.0)
MCH: 30.4 pg (ref 26.0–34.0)
MCHC: 32.2 g/dL (ref 30.0–36.0)
MCV: 94.4 fL (ref 80.0–100.0)
Monocytes Absolute: 0.4 10*3/uL (ref 0.1–1.0)
Monocytes Relative: 12 %
Neutro Abs: 1.9 10*3/uL (ref 1.7–7.7)
Neutrophils Relative %: 53 %
Platelets: 250 10*3/uL (ref 150–400)
RBC: 2.7 MIL/uL — ABNORMAL LOW (ref 3.87–5.11)
RDW: 15.7 % — ABNORMAL HIGH (ref 11.5–15.5)
WBC: 3.5 10*3/uL — ABNORMAL LOW (ref 4.0–10.5)
nRBC: 0 % (ref 0.0–0.2)

## 2021-07-15 LAB — COMPREHENSIVE METABOLIC PANEL
ALT: 11 U/L (ref 0–44)
AST: 15 U/L (ref 15–41)
Albumin: 2.7 g/dL — ABNORMAL LOW (ref 3.5–5.0)
Alkaline Phosphatase: 72 U/L (ref 38–126)
Anion gap: 5 (ref 5–15)
BUN: <5 mg/dL — ABNORMAL LOW (ref 6–20)
CO2: 26 mmol/L (ref 22–32)
Calcium: 8.3 mg/dL — ABNORMAL LOW (ref 8.9–10.3)
Chloride: 108 mmol/L (ref 98–111)
Creatinine, Ser: 0.57 mg/dL (ref 0.44–1.00)
GFR, Estimated: >60 mL/min (ref >60)
Glucose, Bld: 85 mg/dL (ref 70–99)
Potassium: 3.1 mmol/L — ABNORMAL LOW (ref 3.5–5.1)
Sodium: 139 mmol/L (ref 135–145)
Total Bilirubin: 0.5 mg/dL (ref 0.3–1.2)
Total Protein: 5.3 g/dL — ABNORMAL LOW (ref 6.5–8.1)

## 2021-07-15 LAB — GLUCOSE, CAPILLARY
Glucose-Capillary: 116 mg/dL — ABNORMAL HIGH (ref 70–99)
Glucose-Capillary: 89 mg/dL (ref 70–99)
Glucose-Capillary: 97 mg/dL (ref 70–99)

## 2021-07-15 LAB — PHOSPHORUS
Phosphorus: 3.2 mg/dL (ref 2.5–4.6)
Phosphorus: 3 mg/dL (ref 2.5–4.6)
Phosphorus: 2.7 mg/dL (ref 2.5–4.6)

## 2021-07-15 LAB — MAGNESIUM
Magnesium: 1.7 mg/dL (ref 1.7–2.4)
Magnesium: 2.3 mg/dL (ref 1.7–2.4)
Magnesium: 1.9 mg/dL (ref 1.7–2.4)

## 2021-07-15 MED ORDER — MAGNESIUM SULFATE 2 GM/50ML IV SOLN
2.0000 g | Freq: Once | INTRAVENOUS | Status: AC
  Administered 2021-07-15: 2 g via INTRAVENOUS
  Filled 2021-07-15: qty 50

## 2021-07-15 MED ORDER — OSMOLITE 1.5 CAL PO LIQD: 1000.0000 mL | ORAL | Status: DC

## 2021-07-15 MED ORDER — MORPHINE SULFATE (CONCENTRATE) 10 MG/0.5ML PO SOLN
20.0000 mg | ORAL | Status: DC
  Administered 2021-07-15 – 2021-07-16 (×4): 20 mg
  Filled 2021-07-15 (×4): qty 1

## 2021-07-15 MED ORDER — ADULT MULTIVITAMIN LIQUID CH
15.0000 mL | Freq: Every day | ORAL | Status: DC
  Administered 2021-07-15 – 2021-07-18 (×4): 15 mL
  Filled 2021-07-15 (×4): qty 15

## 2021-07-15 MED ORDER — OSMOLITE 1.5 CAL PO LIQD
1000.0000 mL | ORAL | Status: DC
  Administered 2021-07-15 – 2021-07-17 (×3): 1000 mL
  Filled 2021-07-15 (×4): qty 1000

## 2021-07-15 MED ORDER — ALTEPLASE 2 MG IJ SOLR
2.0000 mg | Freq: Once | INTRAMUSCULAR | Status: AC
  Administered 2021-07-15: 2 mg
  Filled 2021-07-15: qty 2

## 2021-07-15 NOTE — Progress Notes (Signed)
Sepsis PROGRESS NOTE    Wendy Hoffman  [id number] DOB: 1966-03-24 DOA: 07/12/2021 PCP: Patient, No Pcp Per (Inactive)    Brief Narrative:  55 year old female with history of pancreatic cancer, hypertension, protein calorie malnutrition with recent G-tube placement by IR on 7/20 and is started on tube feedings and discharged home on 7/26 presented to the emergency room with recurrent nausea, nonbloody vomiting and diarrhea and increasing pain around the feeding tube.  In the emergency room hemodynamically stable.  Slightly tachycardic.  Hypokalemic.  CT scan abdomen pelvis with some stranding around the subcutaneous portion of the tube without fluid collection.  There was some evidence of colonic wall thickening suggestive of colitis.  Started on IV antibiotics, pain control with IV opiates and admitted to the hospital.  Tapering is started after manipulation of G-tube by radiology.  Assessment & Plan:   Principal Problem:   Intractable nausea and vomiting Active Problems:   Asthma   Malignant neoplasm of head of pancreas (HCC)   Hypokalemia   UTI (urinary tract infection)   Colitis   Cellulitis  Intractable nausea and vomiting: Related to underlying pancreatic cancer and intolerance to tube feeding.  Patient on symptomatic treatment with around-the-clock nausea medications, pain medications. Intolerance to oral intake, changed to liquid formulations for morphine for pain management. Will achieve good pain control for discharge.  Will need liquid formulations for discharge.  G-tube malfunction: Seen by radiology.  Readjusted.  Started on tube feeding today.  We will make sure she can tolerate that before she go home. CT scan evidence with some colitis, less likely bacterial infection.  Discontinue all antibiotics to avoid unnecessary complications.  No more diarrhea.  Diet as tolerated.  Pancreatic cancer: Followed by Dr. Learta Codding.  They are planning to start patient on chemotherapy  in the near future.  Chronic pain syndrome: As above.  Hypokalemia: Replace.  Recheck potassium magnesium and phosphorus as she is very high risk of refeeding syndrome.  GERD: On PPI.  Will change to p.o. on discharge.  Essential hypertension: On blood pressure medication with amlodipine.    DVT prophylaxis: enoxaparin (LOVENOX) injection 40 mg Start: 07/12/21 2245   Code Status: Full code Family Communication: None at the bedside Disposition Plan: Status is: Inpatient  Remains inpatient appropriate because:Inpatient level of care appropriate due to severity of illness  Dispo: The patient is from: Home              Anticipated d/c is to: Home              Patient currently is not medically stable to d/c.   Difficult to place patient No         Consultants:  Oncology Interventional radiology  Procedures:  None  Antimicrobials:  Antibiotics Given (last 72 hours)     Date/Time Action Medication Dose Rate   07/12/21 2130 New Bag/Given   ceFEPIme (MAXIPIME) 2 g in sodium chloride 0.9 % 100 mL IVPB 2 g 200 mL/hr   07/12/21 2320 New Bag/Given   metroNIDAZOLE (FLAGYL) IVPB 500 mg 500 mg 100 mL/hr   07/13/21 0641 New Bag/Given   ceFEPIme (MAXIPIME) 2 g in sodium chloride 0.9 % 100 mL IVPB 2 g 200 mL/hr   07/13/21 [id number] New Bag/Given   metroNIDAZOLE (FLAGYL) IVPB 500 mg 500 mg 100 mL/hr   07/13/21 1315 New Bag/Given   ceFEPIme (MAXIPIME) 2 g in sodium chloride 0.9 % 100 mL IVPB 2 g 200 mL/hr   07/13/21 1449 New Bag/Given  metroNIDAZOLE (FLAGYL) IVPB 500 mg 500 mg 100 mL/hr   07/13/21 2201 New Bag/Given   ceFEPIme (MAXIPIME) 2 g in sodium chloride 0.9 % 100 mL IVPB 2 g 200 mL/hr   07/13/21 2300 New Bag/Given   metroNIDAZOLE (FLAGYL) IVPB 500 mg 500 mg 100 mL/hr   07/14/21 0516 New Bag/Given   ceFEPIme (MAXIPIME) 2 g in sodium chloride 0.9 % 100 mL IVPB 2 g 200 mL/hr   07/14/21 0610 New Bag/Given   metroNIDAZOLE (FLAGYL) IVPB 500 mg 500 mg 100 mL/hr   07/14/21 1251  New Bag/Given   cefTRIAXone (ROCEPHIN) 2 g in sodium chloride 0.9 % 100 mL IVPB 2 g 200 mL/hr   07/14/21 1334 New Bag/Given   metroNIDAZOLE (FLAGYL) IVPB 500 mg 500 mg 100 mL/hr   07/15/21 0040 New Bag/Given   metroNIDAZOLE (FLAGYL) IVPB 500 mg 500 mg 100 mL/hr   07/15/21 [id number] New Bag/Given   metroNIDAZOLE (FLAGYL) IVPB 500 mg 500 mg 100 mL/hr   07/15/21 1053 New Bag/Given   cefTRIAXone (ROCEPHIN) 2 g in sodium chloride 0.9 % 100 mL IVPB 2 g 200 mL/hr          Subjective: Patient seen and examined.  Has moderate abdominal pain around the tube.  Denies any active nausea.  She thinks she is supposed to take everything by feeding tube.  She was wondering about changing her morphine to liquid formulations. Patient tells me she never learned how to do tube feeding.  Objective: Vitals:   07/14/21 1946 07/15/21 0328 07/15/21 1322 07/15/21 1417  BP: 125/70 112/67 130/88   Pulse: 87 84 83   Resp: '15 18 14   '$ Temp: 98.7 F (37.1 C) 98.8 F (37.1 C) 98.3 F (36.8 C)   TempSrc: Oral Oral Oral   SpO2: 99% 98% 98% 97%  Weight:      Height:        Intake/Output Summary (Last 24 hours) at 07/15/2021 1441 Last data filed at 07/15/2021 1300 Gross per 24 hour  Intake 1667.74 ml  Output 100 ml  Net 1567.74 ml   Filed Weights   07/13/21 0139  Weight: 89.9 kg    Examination:  General exam: Appears calm and comfortable.  Mildly anxious.  In mild distress on examination otherwise comfortable at rest. Respiratory system: Clear to auscultation. Respiratory effort normal. Cardiovascular system: S1 & S2 heard, RRR.  Gastrointestinal system: Soft.  Mild tenderness along the epigastrium on deep palpation.  No residual guarding.  G-tube remains intact.   Central nervous system: Alert and oriented. No focal neurological deficits. Extremities: Symmetric 5 x 5 power. Skin: No rashes, lesions or ulcers Psychiatry: Judgement and insight appear normal.  Anxious.    Data Reviewed: I have personally  reviewed following labs and imaging studies  CBC: Recent Labs  Lab 07/12/21 1922 07/13/21 1000 07/14/21 0500 07/15/21 0349  WBC 8.5 4.9 3.6* 3.5*  NEUTROABS 7.2 3.1 2.2 1.9  HGB 11.3* 9.2* 8.5* 8.2*  HCT 34.9* 28.8* 26.7* 25.5*  MCV 92.1 93.5 95.0 94.4  PLT 394 293 269 [id number]   Basic Metabolic Panel: Recent Labs  Lab 07/12/21 1922 07/13/21 1000 07/14/21 0500 07/15/21 0349 07/15/21 1227  NA 140 140 140 139  --   K 2.6* 3.0* 2.9* 3.1*  --   CL 100 108 107 108  --   CO2 27 26 20* 26  --   GLUCOSE 119* 87 82 85  --   BUN '9 9 7 '$ <5*  --   CREATININE 0.83  0.77 0.61 0.57  --   CALCIUM 9.3 8.5* 8.4* 8.3*  --   MG  --  1.8 1.9 1.7 2.3  PHOS  --  3.7 3.7 3.2 3.0   GFR: Estimated Creatinine Clearance: 88.3 mL/min (by C-G formula based on SCr of 0.57 mg/dL). Liver Function Tests: Recent Labs  Lab 07/12/21 1922 07/13/21 1000 07/14/21 0500 07/15/21 0349  AST '20 17 15 15  '$ ALT '15 11 11 11  '$ ALKPHOS 101 77 64 72  BILITOT 0.8 0.6 0.6 0.5  PROT 6.8 5.6* 5.1* 5.3*  ALBUMIN 3.3* 2.8* 2.5* 2.7*   Recent Labs  Lab 07/12/21 1922  LIPASE 24   No results for input(s): AMMONIA in the last 168 hours. Coagulation Profile: No results for input(s): INR, PROTIME in the last 168 hours. Cardiac Enzymes: No results for input(s): CKTOTAL, CKMB, CKMBINDEX, TROPONINI in the last 168 hours. BNP (last 3 results) No results for input(s): PROBNP in the last 8760 hours. HbA1C: No results for input(s): HGBA1C in the last 72 hours. CBG: Recent Labs  Lab 07/15/21 1121  GLUCAP 89   Lipid Profile: No results for input(s): CHOL, HDL, LDLCALC, TRIG, CHOLHDL, LDLDIRECT in the last 72 hours. Thyroid Function Tests: No results for input(s): TSH, T4TOTAL, FREET4, T3FREE, THYROIDAB in the last 72 hours. Anemia Panel: Recent Labs    07/14/21 0803  VITAMINB12 604  FOLATE 17.6  FERRITIN 186  TIBC 157*  IRON 37  RETICCTPCT 1.7   Sepsis Labs: No results for input(s): PROCALCITON, LATICACIDVEN  in the last 168 hours.  Recent Results (from the past 240 hour(s))  Urine Culture     Status: Abnormal   Collection Time: 07/12/21  7:22 PM   Specimen: Urine, Clean Catch  Result Value Ref Range Status   Specimen Description   Final    URINE, CLEAN CATCH Performed at [facility], [place] [address], [place], [place] [address]    Special Requests   Final    Immunocompromised Performed at [facility], [place] [address], [place], [place] [address]    Culture (A)  Final    <10,000 COLONIES/mL INSIGNIFICANT GROWTH Performed at [place] [facility] [address], [place], [place] [address]    Report Status 07/14/2021 FINAL  Final  Resp Panel by RT-PCR (Flu A&B, Covid) Nasopharyngeal Swab     Status: None   Collection Time: 07/12/21  9:55 PM   Specimen: Nasopharyngeal Swab; Nasopharyngeal(NP) swabs in vial transport medium  Result Value Ref Range Status   SARS Coronavirus 2 by RT PCR NEGATIVE NEGATIVE Final    Comment: (NOTE) SARS-CoV-2 target nucleic acids are NOT DETECTED.  The SARS-CoV-2 RNA is generally detectable in upper respiratory specimens during the acute phase of infection. The lowest concentration of SARS-CoV-2 viral copies this assay can detect is 138 copies/mL. A negative result does not preclude SARS-Cov-2 infection and should not be used as the sole basis for treatment or other patient management decisions. A negative result may occur with  improper specimen collection/handling, submission of specimen other than nasopharyngeal swab, presence of viral mutation(s) within the areas targeted by this assay, and inadequate number of viral copies(<138 copies/mL). A negative result must be combined with clinical observations, patient history, and epidemiological information. The expected result is Negative.  Fact Sheet for Patients:  EntrepreneurPulse.com.au  Fact Sheet for Healthcare Providers:   IncredibleEmployment.be  This test is no t yet approved or cleared by the [place] and  has been authorized for  detection and/or diagnosis of SARS-CoV-2 by FDA under an Emergency Use Authorization (EUA). This EUA will remain  in effect (meaning this test can be used) for the duration of the COVID-19 declaration under Section 564(b)(1) of the Act, 21 U.S.C.section 360bbb-3(b)(1), unless the authorization is terminated  or revoked sooner.       Influenza A by PCR NEGATIVE NEGATIVE Final   Influenza B by PCR NEGATIVE NEGATIVE Final    Comment: (NOTE) The Xpert Xpress SARS-CoV-2/FLU/RSV plus assay is intended as an aid in the diagnosis of influenza from Nasopharyngeal swab specimens and should not be used as a sole basis for treatment. Nasal washings and aspirates are unacceptable for Xpert Xpress SARS-CoV-2/FLU/RSV testing.  Fact Sheet for Patients: EntrepreneurPulse.com.au  Fact Sheet for Healthcare Providers: IncredibleEmployment.be  This test is not yet approved or cleared by the [place] FDA and has been authorized for detection and/or diagnosis of SARS-CoV-2 by FDA under an Emergency Use Authorization (EUA). This EUA will remain in effect (meaning this test can be used) for the duration of the COVID-19 declaration under Section 564(b)(1) of the Act, 21 U.S.C. section 360bbb-3(b)(1), unless the authorization is terminated or revoked.  Performed at [facility], [place] [address] [place], [place] [address]          Radiology Studies: No results found.      Scheduled Meds:  amLODipine  10 mg Per Tube Daily   Chlorhexidine Gluconate Cloth  6 each Topical Daily   enoxaparin (LOVENOX) injection  40 mg Subcutaneous Q24H   feeding supplement (OSMOLITE 1.5 CAL)  1,000 mL Per Tube Q24H   mometasone-formoterol  2 puff Inhalation BID   morphine CONCENTRATE  20 mg Per Tube Q4H    multivitamin  15 mL Per Tube Daily   pantoprazole (PROTONIX) IV  40 mg Intravenous Q24H   potassium chloride  40 mEq Per Tube BID   scopolamine  1 patch Transdermal Q72H   sodium chloride flush  10-40 mL Intracatheter Q12H   sodium chloride flush  10 mL Per Tube Q12H   sucralfate  1 g Per Tube QID   Continuous Infusions:  promethazine (PHENERGAN) injection (IM or IVPB) 6.25 mg (07/13/21 0120)     LOS: 1 day    Time spent: 30 minutes    Barb Merino, MD [facility] Pager [phone]

## 2021-07-15 NOTE — Progress Notes (Signed)
Initial Nutrition Assessment  DOCUMENTATION CODES:   Obesity unspecified, Non-severe (moderate) malnutrition in context of chronic illness  INTERVENTION:   Monitor magnesium, potassium, and phosphorus daily for at least 3 days, MD to replete as needed, as pt is at risk for refeeding syndrome.  -Initiate Osmolite 1.5 @ 40 ml/hr via J-tube for 8 hours then advance to 50 ml/hr for last 8 hours (16 hr feeds total). 8/4: Run Osmolite 1.5 @ 60 ml/hr for 8 hours then advance to 70 ml/hr for last 8 hours (16 hrs total)  Goal rate is Osmolite 1.5 @ 85 ml/hr x 16 hours via J-tube (6 cartons daily) -Free water recommendation: 240 ml TID via tube or PO -Goal provides 2040 kcals, 85g protein and 1756 ml H2O  -Multivitamin via tube daily  NUTRITION DIAGNOSIS:   Moderate Malnutrition related to chronic illness, cancer and cancer related treatments as evidenced by energy intake < or equal to 75% for > or equal to 1 month, percent weight loss, mild muscle depletion.  GOAL:   Patient will meet greater than or equal to 90% of their needs  MONITOR:   PO intake, TF tolerance, Labs, Weight trends, I & O's  REASON FOR ASSESSMENT:   Consult Enteral/tube feeding initiation and management  ASSESSMENT:   55 year old obese African-American female with a past medical history significant for but not limited to pancreatic cancer, hypertension, asthma, history of protein calorie malnutrition, recent hospital admission where she ended up having a G-tube placement by IR on 07/01/2021 and started on tube feedings and subsequently discharged on July 07, 2021.  She presents to the hospital today for recurrent nausea, nonbloody vomiting as well as diarrhea and increasing pain around her tube site.  She is reported that she has been tolerating her tube feedings at home but then developed pain around her tube site and started having nausea, vomiting with inability to take anything by mouth and had multiple loose stools  daily.  Patient reports she did not know how to manage her TF at home. States that she was never educated by home health agency prior to discharge on 7/26. Per chart review, agency was to educate pt per TOC note on 7/25.  Per LCSW, [facility] provided equipment and supplies but did not provide home health for pt. Currently in search for someone to manage her TF at home. Pt also followed by [place] [facility], will route note to them as well. Next infusion was planned for 8/4.  Pt states she would run Osmolite 1.5, 1 carton at a time at 85 ml/hr. States at most she would use 2 cartons daily, which provided 710 kcals (37% of needs) and 29g protein (34% of needs) . Did not know she would need more than 1 carton in bag at a time. Was not using G-tube for venting either. Reviewed TF goal and how many cartons to use daily (6).  Pt on clears but not drinking any at this time. Was having episodes of N/V.   Per weight records, pt has lost 47 lbs since 5/3 (19% wt loss x 3 months, significant for time frame).  Medications: KLOR-CON, Carafate, IV Mg sulfate, IV Zofran  Labs reviewed: Low K   NUTRITION - FOCUSED PHYSICAL EXAM:  Flowsheet Row Most Recent Value  Orbital Region No depletion  Upper Arm Region Mild depletion  Thoracic and Lumbar Region Unable to assess  Buccal Region No depletion  Temple Region Mild depletion  Clavicle Bone Region No depletion  Clavicle and Acromion  Bone Region No depletion  Scapular Bone Region No depletion  Dorsal Hand Mild depletion  Patellar Region Unable to assess  Anterior Thigh Region Unable to assess  Posterior Calf Region Unable to assess  Edema (RD Assessment) None       Diet Order:   Diet Order             Diet clear liquid Room service appropriate? Yes; Fluid consistency: Thin  Diet effective now                   EDUCATION NEEDS:   Education needs have been addressed  Skin:  Skin Assessment: Reviewed RN Assessment  Last BM:   8/1  Height:   Ht Readings from Last 1 Encounters:  07/13/21 '5\' 4"'$  (1.626 m)    Weight:   Wt Readings from Last 1 Encounters:  07/13/21 89.9 kg    BMI:  Body mass index is 34.02 kg/m.  Estimated Nutritional Needs:   Kcal:  1900-2100  Protein:  85-100g  Fluid:  2L/day   Clayton Bibles, MS, RD, LDN Inpatient Clinical Dietitian Contact information available via Amion

## 2021-07-15 NOTE — TOC Initial Note (Addendum)
Transition of Care [facility]) - Initial/Assessment Note    Patient Details  Name: Wendy Hoffman MRN: [id number] Date of Birth: 1966-06-22  [facility] CM/SW Contact:    Trish Mage, LCSW Phone Number: 07/15/2021, 10:57 AM  Clinical Narrative:    Patient who has pancreatic cancer, getting on-going treatment at [facility] facility on [facility] was seen due to high risk of readmission.  Ms Pontecorvo lives alone, has niece and 2 nephews in the area for support, uses [employer] transport to get back and forth to appointments, has all  needed DME, recently approved for disability and [employer], states that when she left the last time she was supposed to get [facility] RN services for someone to oversee her feeding tube needs/process and no one was found. She is hopeful that we will be successful this time.  I explained the challenge involved.  Will work on identifying provider for [employer], social work. TOC will continue to follow during the course of hospitalization.  Addendum: [facility] Guess, [facility] have all rejected patient for [facility] services due to payor source.  Contacted Pam with [facility] and asked her to come by to see patient and do further education               Expected Discharge Plan: [place] Services Barriers to Discharge: Other (must enter comment) (Needs HH provider)   Patient Goals and CMS Choice        Expected Discharge Plan and Services Expected Discharge Plan: [place] Choice: [place] arrangements for the past 2 months: Apartment Expected Discharge Date:  (unknown)                                    Prior Living Arrangements/Services Living arrangements for the past 2 months: Apartment Lives with:: Self Patient language and need for interpreter reviewed:: Yes        Need for Family Participation in Patient Care: Yes (Comment) Care giver support system in place?: Yes (comment) Current home  services: DME Criminal Activity/Legal Involvement Pertinent to Current Situation/Hospitalization: No - Comment as needed  Activities of Daily Living Home Assistive Devices/Equipment: Bedside commode/3-in-1, Eyeglasses, Walker (specify type), Feeding equipment, Other (Comment), Cane (specify quad or straight) (GJ tube, front wheeled walker, quad cane) ADL Screening (condition at time of admission) Patient's cognitive ability adequate to safely complete daily activities?: Yes Is the patient deaf or have difficulty hearing?: No Does the patient have difficulty seeing, even when wearing glasses/contacts?: No Does the patient have difficulty concentrating, remembering, or making decisions?: No Patient able to express need for assistance with ADLs?: Yes Does the patient have difficulty dressing or bathing?: Yes Independently performs ADLs?: No Communication: Independent Dressing (OT): Needs assistance Is this a change from baseline?: Change from baseline, expected to last >3 days Grooming: Independent Feeding: Independent Bathing: Needs assistance Is this a change from baseline?: Change from baseline, expected to last >3 days Toileting: Independent with device (comment) In/Out Bed: Independent Walks in Home: Independent with device (comment) Does the patient have difficulty walking or climbing stairs?: Yes (secondary to weakness) Weakness of Legs: Both Weakness of Arms/Hands: None  Permission Sought/Granted                  Emotional Assessment Appearance:: Appears stated age Attitude/Demeanor/Rapport: Engaged Affect (typically observed): Appropriate Orientation: : Oriented to  Self, Oriented to Place, Oriented to  Time, Oriented to Situation Alcohol / Substance Use: Not Applicable Psych Involvement: No (comment)  Admission diagnosis:  Hypokalemia [E87.6] Colitis [K52.9] Intractable nausea and vomiting [R11.2] Patient Active Problem List   Diagnosis Date Noted   Intractable  nausea and vomiting 07/12/2021   Colitis 07/12/2021   Cellulitis 07/12/2021   UTI (urinary tract infection) 07/01/2021   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 06/18/2021   Hypokalemia 06/18/2021   Malnutrition of moderate degree 06/18/2021   Cancer ([place]) 06/17/2021   Cervical ca ([place]) 06/17/2021   Nausea and vomiting 06/17/2021   Genetic testing 03/23/2021   Port-A-Cath in place 03/16/2021   Goals of care, counseling/discussion 03/06/2021   Morbidly obese ([place]) 02/27/2021   Heme positive stool    Iron deficiency anemia due to chronic blood loss    Epigastric pain    Malignant neoplasm of head of pancreas ([place]) 02/17/2021   Aortic atherosclerosis ([place]) 02/17/2021   GI bleeding 02/16/2021   Class 3 obesity with current BMI at 44.30 kg/m 02/16/2021   COPD (chronic obstructive pulmonary disease) ([place]) 02/16/2021   Symptomatic anemia    Pancreatic cancer ([place]) 02/2021   Snoring 05/03/2016   Morbid obesity ([place]) 04/25/2014   Asthma exacerbation 04/22/2014   Asthma attack 04/22/2014   Asthma with acute exacerbation 11/03/2013   Hypothyroidism 11/03/2013   Tobacco abuse 11/03/2013   Migraine 09/22/2012   Weakness of right side of body 09/22/2012   Thyroid disease    Hypertension    High cholesterol    H/O: hysterectomy    Asthma 11/26/2008   HEADACHE, CHRONIC 11/26/2008   PCP:  Patient, No Pcp Per (Inactive) Pharmacy:   [facility] [address] [place] [address] Phone: [phone] Fax: [facility] [id number] - [place], [place] - [address] AT [place] [address] [place] [address] Phone: [phone] Fax: [phone]     Social Determinants of Health (SDOH) Interventions    Readmission Risk Interventions Readmission Risk Prevention Plan 06/23/2021  Transportation Screening Complete  Medication Review Press [employer]) Complete  HRI or [place]  Complete  SW Recovery Care/Counseling Consult Complete  [place] Not Applicable  Some recent data might be hidden

## 2021-07-15 NOTE — Progress Notes (Signed)
HEMATOLOGY-ONCOLOGY PROGRESS NOTE Wendy Hoffman continues to have abdominal pain .  She had 1 episode of vomiting yesterday and another during the night. The G-tube balloon was repositioned yesterday.  She has not been using the venting gastrostomy. No diarrhea. 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  The report date is March 23, 2021.   The Multi-Cancer Panel with pancreatitis genes and preliminary pancreatic cancer genes offered by [facility] includes sequencing and/or deletion duplication testing of the following 91 genes: AIP, ALK, APC, ATM, AXIN2,BAP1,  BARD1, BLM, BMPR1A, BRCA1, BRCA2, BRIP1, CASR, CDC73, CDH1, CDK4, CDKN1B, CDKN1C, CDKN2A (p14ARF), CDKN2A (p16INK4a), CEBPA, CFTR, CHEK2, CPA1, CTNNA1, CTRC, DICER1, DIS3L2, EGFR (c.2369C>T, p.Thr790Met variant only), EPCAM (Deletion/duplication testing only), FANCC, FH, FLCN, GATA2, GPC3, GREM1 (Promoter region deletion/duplication testing only), HOXB13 (c.251G>A, p.Gly84Glu), HRAS, KIT, MAX, MEN1, MET, MITF (c.952G>A, p.Glu318Lys variant only), MLH1, MSH2, MSH3, MSH6, MUTYH, NBN, NF1, NF2, NTHL1, PALB2, PALLD, PDGFRA, PHOX2B, PMS2, POLD1, POLE, POT1, PRKAR1A, PRSS1, PTCH1, PTEN, RAD50, RAD51C, RAD51D, RB1, RECQL4, RET, RNF43, RUNX1, SDHAF2, SDHA (sequence changes only), SDHB, SDHC, SDHD, SMAD4, SMARCA4, SMARCB1, SMARCE1, SPINK1, STK11, SUFU, TERC, TERT, TMEM127, TP53, TSC1, TSC2, VHL, WRN and WT1.    07/16/2021 -  Chemotherapy    Patient is on  Treatment Plan: PANCREATIC ABRAXANE / GEMCITABINE D1,8,15 Q28D       Pancreatic cancer ([place])  02/2021 Initial Diagnosis   Pancreatic cancer ([place])    07/16/2021 -  Chemotherapy    Patient is on Treatment Plan: PANCREATIC ABRAXANE / GEMCITABINE D1,8,15 Q28D        PHYSICAL EXAMINATION:  Vitals:   07/14/21 1946 07/15/21 0328  BP: 125/70 112/67  Pulse: 87 84  Resp: 15 18  Temp: 98.7 F (37.1 C) 98.8 F (37.1 C)  SpO2: 99% 98%   Intake/Output from previous day: 08/02 0701 - 08/03 0700 In: 1187.7 [I.V.:787.7; IV Piggyback:400] Out: 650 [Urine:550; Emesis/NG output:100]  OROPHARYNX: Mild thrush at the left buccal mucosa Lungs: Clear bilaterally Cardiac: Regular rate and rhythm ABDOMEN: Soft, no mass, no hepatomegaly, tender in the mid upper abdomen and surrounding the G-tube site.  G-tube site without erythema  vascular: No leg edema LABORATORY DATA:  I have reviewed the data as listed CMP Latest Ref Rng & Units 07/15/2021 07/14/2021 07/13/2021  Glucose 70 - 99 mg/dL 85 82 87  BUN 6 - 20 mg/dL <5(L) 7 9  Creatinine 0.44 - 1.00 mg/dL 0.57 0.61 0.77  Sodium 135 - 145 mmol/L 139 140 140  Potassium 3.5 - 5.1 mmol/L 3.1(L) 2.9(L) 3.0(L)  Chloride 98 - 111 mmol/L 108 107 108  CO2 22 - 32 mmol/L 26 20(L) 26  Calcium 8.9 - 10.3 mg/dL 8.3(L) 8.4(L) 8.5(L)  Total Protein 6.5 - 8.1 g/dL 5.3(L) 5.1(L) 5.6(L)  Total Bilirubin 0.3 - 1.2 mg/dL 0.5 0.6 0.6  Alkaline Phos 38 - 126 U/L 72 64 77  AST 15 - 41 U/L _0 ALT 0 - 44 U/L 11 11 11  Lab Results  Component Value Date   WBC 3.5 (L) 07/15/2021   HGB 8.2 (L) 07/15/2021   HCT 25.5 (L) 07/15/2021   MCV 94.4 07/15/2021   PLT 250 07/15/2021   NEUTROABS 1.9 07/15/2021    CT ABDOMEN PELVIS WO CONTRAST  Result Date: 06/17/2021 CLINICAL DATA:  Rule out bowel obstruction. History of pancreas cancer. EXAM: CT ABDOMEN AND PELVIS WITHOUT CONTRAST TECHNIQUE: Multidetector CT imaging of the abdomen and pelvis was performed following  the standard protocol without IV contrast. COMPARISON:  05/23/2021 FINDINGS: Lower chest: No acute abnormality. Hepatobiliary: Within a background of diffuse hepatic steatosis there are multiple liver metastases as noted previously: The dominant lesion within the lateral segment of left hepatic lobe measures 7.8 cm, image 26/2. Previously this measured the same. The dominant lesion within the right hepatic lobe measures 8.2 cm, image 25/2. Previously 8 cm. Status post cholecystectomy. No biliary dilatation. Pancreas: Head of pancreas mass is suboptimally evaluated due to lack of IV contrast material. Subjectively, this does not appear significantly changed in size when compared with the previous contrast enhanced MRI. Spleen: Normal in size without focal abnormality. Adrenals/Urinary Tract: Normal adrenal glands. No hydronephrosis identified bilaterally. Punctate stone noted within inferior pole of left kidney. Exophytic cyst is again noted arising off the posterior cortex of the interpolar right kidney measuring 1.3 cm. Urinary bladder appears collapsed. Stomach/Bowel: Stomach is nondistended. The appendix is visualized and appears normal. No pathologic dilatation of the large or small bowel loops. No significant bowel wall thickening or surrounding inflammatory fat stranding. Vascular/Lymphatic: Mild aortic atherosclerosis. No aneurysm. No abdominopelvic adenopathy identified. Reproductive: Status post hysterectomy. No adnexal masses.  The Other: No ascites or focal fluid collections. No peritoneal nodularity identified at this time. Musculoskeletal: No acute or significant osseous findings. IMPRESSION: 1. No acute findings within the abdomen or pelvis. No evidence for bowel obstruction. 2. Multiple liver metastases.  Similar to previous exam. 3. Head of pancreas mass is suboptimally evaluated due to lack of IV contrast material. Subjectively, this does not appear significantly changed in size when compared with  the previous contrast enhanced MRI. 4. Aortic atherosclerosis. Aortic Atherosclerosis (ICD10-I70.0). Electronically Signed   By: Kerby Moors M.D.   On: 06/17/2021 19:07   DG THORACOLUMABAR SPINE  Result Date: 06/28/2021 CLINICAL DATA:  Low back pain for 1 day, history of metastatic pancreatic cancer EXAM: THORACOLUMBAR SPINE 1V COMPARISON:  06/26/2021 FINDINGS: Frontal and lateral views of the thoracolumbar spine are obtained. There are no acute or destructive bony lesions. Mild spondylosis is seen within the mid to lower thoracic spine. Severe facet hypertrophic changes are seen within the mid to lower lumbar spine. Sacroiliac joints are normal. Visualized bowel gas pattern is unremarkable. IMPRESSION: 1. Mild mid to lower thoracic spondylosis, with severe lower lumbar facet hypertrophy. 2. No acute or destructive bony lesions. Electronically Signed   By: Randa Ngo M.D.   On: 06/28/2021 15:22   MR BRAIN W WO CONTRAST  Result Date: 07/05/2021 CLINICAL DATA:  Staging of gastrointestinal small cancer. History of pancreatic carcinoma. EXAM: MRI HEAD WITHOUT AND WITH CONTRAST TECHNIQUE: Multiplanar, multiecho pulse sequences of the brain and surrounding structures were obtained without and with intravenous contrast. CONTRAST:  96m GADAVIST GADOBUTROL 1 MMOL/ML IV SOLN COMPARISON:  None. FINDINGS: Brain: No acute infarct, mass effect or extra-axial collection. No acute or chronic hemorrhage. There is multifocal hyperintense T2-weighted signal within the white matter. Parenchymal volume and CSF spaces are normal. A partially empty sella is incidentally noted.  There is no abnormal contrast enhancement. Vascular: Major flow voids are preserved. Skull and upper cervical spine: Normal calvarium and skull base. Visualized upper cervical spine and soft tissues are normal. Sinuses/Orbits:No paranasal sinus fluid levels or advanced mucosal thickening. No mastoid or middle ear effusion. Normal orbits. IMPRESSION:  1. No intracranial metastatic disease. 2. Multifocal hyperintense T2-weighted signal within the white matter, likely due to arteriosclerosis. Electronically Signed   By: Ulyses Jarred M.D.   On: 07/05/2021 19:37   CT ABDOMEN PELVIS W CONTRAST  Result Date: 07/12/2021 CLINICAL DATA:  Abdominal pain and distension. Recent G-tube. History of pancreatic cancer. EXAM: CT ABDOMEN AND PELVIS WITH CONTRAST TECHNIQUE: Multidetector CT imaging of the abdomen and pelvis was performed using the standard protocol following bolus administration of intravenous contrast. CONTRAST:  53m OMNIPAQUE IOHEXOL 350 MG/ML SOLN COMPARISON:  Most recent CT 06/26/2021 FINDINGS: Lower chest: Central line tip in the right ventricle. Heart is normal in size. No basilar nodule, focal airspace disease or pleural effusion. Hepatobiliary: Multiple hepatic masses are again seen, less well-defined on the current exam due to phase of contrast. Index lesion in the left lobe measures 7.5 x 5.8 cm, series 2, image 14, previously 8.0 x 6.1 cm. Index lesion in the right lobe measures 7.8 x 4.1 cm, series 2, image 12, previously 7.9 x 4.5 cm. There additional smaller lesions in the right and left hepatic lobe. The gallbladder is surgically absent. There is no intra or extrahepatic biliary ductal dilatation. Pancreas: Ill-defined hypoenhancing pancreatic head mass measures approximately 2.3 x 3.1 cm, series 2, image 38, previously 2.8 x 3.1 cm. Slight pancreatic ductal prominence and parenchymal atrophy are stable. No acute peripancreatic inflammation Spleen: Normal in size without focal abnormality. Splenic vein is patent. Adrenals/Urinary Tract: No adrenal nodule. No hydronephrosis or renal calculi. No focal renal lesion. Unremarkable urinary bladder for degree of distension. Stomach/Bowel: New gastrostomy tube is in place. The balloon is not opposed to the gastric or abdominal wall. There is mild edema and fat stranding along the subcutaneous portion  of the gastrostomy tube but no focal fluid collection. Tip of the gastrostomy tube is in the proximal jejunum. The stomach is decompressed. Fluid/liquid stool in the colon with suggestion of mild colonic wall thickening at the hepatic flexure and proximal transverse, series 2, image 49. There is also wall thickening and mesenteric edema involving pelvic bowel small bowel loops. No pneumatosis, obstruction, or abnormal distention. Normal appendix. Vascular/Lymphatic: Mild aortic atherosclerosis without aneurysm. Patent portal and splenic veins. No mesenteric or portal venous gas. No definite peripancreatic adenopathy. No enlarged lymph nodes in the abdomen or pelvis. Reproductive: Hysterectomy.  Quiescent ovaries.  No adnexal mass. Other: Small volume of free fluid in the pelvis and right greater than left pericolic gutter, likely reactive. No definite omental thickening. No free air. Musculoskeletal: Sclerotic focus within posterior L3 vertebra is unchanged from prior. No acute osseous abnormalities are seen. IMPRESSION: 1. New gastrostomy tube in place. The balloon is within the gastric lumen, not opposed to the gastric or abdominal wall. There is mild edema and fat stranding along the subcutaneous portion of the gastrostomy tube but no focal fluid collection. 2. Fluid/liquid stool in the colon with suggestion of mild colonic wall thickening at the hepatic flexure and proximal transverse. There is also wall thickening and mesenteric edema involving pelvic bowel loops. Findings suspicious for enteritis/colitis. 3. Multiple hepatic masses, slightly decreased in size from prior exam. 4. Pancreatic head mass is unchanged in size from prior exam.  5. Small sclerotic focus within L3 vertebra is nonspecific in the setting. This is unchanged from prior exam. Aortic Atherosclerosis (ICD10-I70.0). Electronically Signed   By: Keith Rake M.D.   On: 07/12/2021 21:48   CT ABDOMEN PELVIS W CONTRAST  Result Date:  06/26/2021 CLINICAL DATA:  Worsening nausea and vomiting over the last week, pancreatic cancer undergoing chemotherapy EXAM: CT ABDOMEN AND PELVIS WITH CONTRAST TECHNIQUE: Multidetector CT imaging of the abdomen and pelvis was performed using the standard protocol following bolus administration of intravenous contrast. CONTRAST:  38m OMNIPAQUE IOHEXOL 350 MG/ML SOLN COMPARISON:  06/17/2021, 05/23/2021, 02/16/2021 FINDINGS: Lower chest: No acute pleural or parenchymal lung disease. Central venous catheter tip within the right atrium near the junction with the IVC. No pericardial effusion. Hepatobiliary: Multiple hepatic masses are again identified. Largest lesion within the left lobe liver reference image 27/2 measures 8.0 x 6.1 cm, previously measuring 7.6 x 6.0 cm on MRI 05/23/2021. Largest lesion within the right lobe liver measures up to 8.2 x 4.5 cm on image 23/2, previously measuring 7.6 x 3.7 cm on MRI. Smaller lesions throughout the left lobe liver more inferiorly are grossly unchanged, though less well visualized on CT compared to MRI. No new lesions are definitively identified. No intrahepatic biliary duct dilation. Gallbladder is surgically absent. Pancreas: The ill-defined mass within the pancreatic head measures up to approximately 2.8 x 3.1 cm reference image 65/2, previously having measured 2.9 x 2.9 cm on MRI 05/23/2021. Mild pancreatic duct dilation and upstream pancreatic parenchymal atrophy again noted unchanged. Spleen: No focal splenic abnormalities. Mild splenomegaly unchanged. Adrenals/Urinary Tract: No urinary tract calculi or obstructive uropathy within either kidney. The kidneys enhance normally and symmetrically. Small left renal cortical cysts unchanged. The adrenals are unremarkable. Bladder is only minimally distended with no gross abnormalities. Stomach/Bowel: No bowel obstruction or ileus. Normal retrocecal appendix. No bowel wall thickening or inflammatory change. Vascular/Lymphatic:  Mild atherosclerosis of the aorta and iliac vessels unchanged. No discrete adenopathy within the abdomen or pelvis. Reproductive: Status post hysterectomy. No adnexal masses. Other: Trace pelvic free fluid. No free intraperitoneal gas. No abdominal wall hernia. Musculoskeletal: No acute or destructive bony lesions. Reconstructed images demonstrate no additional findings. IMPRESSION: 1. Grossly stable mass within the pancreatic head compatible with known pancreatic cancer. 2. No significant change in the size and number of known liver metastases, allowing for differences in technique compared to previous MRI. 3. Trace pelvic free fluid. 4.  Aortic Atherosclerosis (ICD10-I70.0). Electronically Signed   By: MRanda NgoM.D.   On: 06/26/2021 17:01   IR GASTROSTOMY TUBE MOD SED  Result Date: 07/01/2021 INDICATION: 55year old woman with history metastatic pancreatic cancer presents to interventional radiology for GRosebushtube placement for treatment of nausea and vomiting likely related to compression of the stomach from hepatic metastases. EXAM: Fluoroscopy guided GJ tube placement MEDICATIONS: Vancomycin 1 gm IV; Antibiotics were administered within 1 hour of the procedure. Glucagon 1 mg IV ANESTHESIA/SEDATION: Versed 3 mg IV; Fentanyl 150 mcg IV Moderate Sedation Time:  63 minutes The patient was continuously monitored during the procedure by the interventional radiology nurse under my direct supervision. CONTRAST:  10 mL of Omnipaque 300-administered into the gastric lumen. FLUOROSCOPY TIME:  Fluoroscopy Time: 8 minutes 12 seconds (229 mGy). COMPLICATIONS: None immediate. PROCEDURE: Informed written consent was obtained from the patient after a thorough discussion of the procedural risks, benefits and alternatives. All questions were addressed. Maximal Sterile Barrier Technique was utilized including caps, mask, sterile gowns, sterile gloves, sterile drape, hand  hygiene and skin antiseptic. A timeout was performed  prior to the initiation of the procedure. The inferior margin of the liver was marked utilizing ultrasound guidance. An orogastric tube was placed with fluoroscopic guidance. The anterior abdomen was prepped and draped in sterile fashion. Ultrasound evaluation of the left upper quadrant was performed to confirm the position of the liver. The skin and subcutaneous tissues were anesthetized with 1% lidocaine. Single gastropexy was placed utilizing fluoroscopic guidance. 19 gauge needle was directed into the distended stomach with fluoroscopic guidance. A wire was advanced into the stomach. 9-French vascular sheath was placed and the orogastric tube was snared using a Gooseneck snare device. Guidewire was advanced through the orogastric tube and snared. The snare and wire were pulled out of the patient's mouth. The snare device was connected to a 20-French gastrostomy tube. The snare device and gastrostomy tube were pulled through the patient's mouth and out the anterior abdominal wall. The gastrostomy tube was cut to an appropriate length. Contrast injection through gastrostomy tube confirmed placement within the stomach. Kumpe catheter and Glidewire advanced through the gastrostomy tube to the proximal jejunum. 9 French jejunostomy tube was inserted through the G-tube with tip positioned in the proximal jejunum. Position of the J limb was conformed by administering contrast under fluoroscopy. The gastrojejunostomy tube was flushed with normal saline. IMPRESSION: Gastrojejunostomy tube placed utilizing fluoroscopic guidance. The gastrostomy tube is 14 [place] while the jejunostomy tube is 68 [place]. Electronically Signed   By: Miachel Roux M.D.   On: 07/01/2021 16:41   VAS [place] LOWER EXTREMITY VENOUS (DVT)  Result Date: 06/29/2021  Lower Venous DVT Study Patient Name:  Wendy Hoffman  Date of Exam:   06/29/2021 Medical Rec #: [id number]        Accession #:    [id number] Date of Birth: [birth year]        Patient Gender: F  Patient Age:   89B [facility] Procedure:      VAS [place] LOWER EXTREMITY VENOUS (DVT) Referring Phys: [place] --------------------------------------------------------------------------------  Indications: RLE swelling.  Risk Factors: Chemotherapy Cancer Pancreatic w/ mets. Limitations: Poor ultrasound/tissue interface. Comparison Study: No previous exams Performing Technologist: Jody Hill RVT, RDMS  Examination Guidelines: A complete evaluation includes B-mode imaging, spectral Doppler, color Doppler, and power Doppler as needed of all accessible portions of each vessel. Bilateral testing is considered an integral part of a complete examination. Limited examinations for reoccurring indications may be performed as noted. The reflux portion of the exam is performed with the patient in reverse Trendelenburg.  +---------+---------------+---------+-----------+----------+--------------+ RIGHT    CompressibilityPhasicitySpontaneityPropertiesThrombus Aging +---------+---------------+---------+-----------+----------+--------------+ CFV      Full           Yes      Yes                                 +---------+---------------+---------+-----------+----------+--------------+ SFJ      Full                                                        +---------+---------------+---------+-----------+----------+--------------+ FV Prox  Full           Yes      Yes                                 +---------+---------------+---------+-----------+----------+--------------+  FV Mid   Full           Yes      Yes                                 +---------+---------------+---------+-----------+----------+--------------+ FV DistalFull           Yes      Yes                                 +---------+---------------+---------+-----------+----------+--------------+ PFV      Full                                                         +---------+---------------+---------+-----------+----------+--------------+ POP      Full           Yes      Yes                                 +---------+---------------+---------+-----------+----------+--------------+ PTV      Full                                                        +---------+---------------+---------+-----------+----------+--------------+ PERO     Full                                                        +---------+---------------+---------+-----------+----------+--------------+   +---------+---------------+---------+-----------+----------+--------------+ LEFT     CompressibilityPhasicitySpontaneityPropertiesThrombus Aging +---------+---------------+---------+-----------+----------+--------------+ CFV      Full           Yes      Yes                                 +---------+---------------+---------+-----------+----------+--------------+ SFJ      Full                                                        +---------+---------------+---------+-----------+----------+--------------+ FV Prox  Full           Yes      Yes                                 +---------+---------------+---------+-----------+----------+--------------+ FV Mid   Full           Yes      Yes                                 +---------+---------------+---------+-----------+----------+--------------+ FV DistalFull  Yes      Yes                                 +---------+---------------+---------+-----------+----------+--------------+ PFV      Full                                                        +---------+---------------+---------+-----------+----------+--------------+ POP      Full           Yes      Yes                                 +---------+---------------+---------+-----------+----------+--------------+ PTV      Full                                                         +---------+---------------+---------+-----------+----------+--------------+ PERO     Full                                                        +---------+---------------+---------+-----------+----------+--------------+     Summary: BILATERAL: - No evidence of deep vein thrombosis seen in the lower extremities, bilaterally. - No evidence of superficial venous thrombosis in the lower extremities, bilaterally. -No evidence of popliteal cyst, bilaterally. RIGHT: - Ultrasound characteristics of enlarged lymph nodes are noted in the groin.  LEFT: - Ultrasound characteristics of enlarged lymph nodes noted in the groin.  *See table(s) above for measurements and observations. Electronically signed by Ruta Hinds MD on 06/29/2021 at 7:52:11 PM.    Final     ASSESSMENT AND PLAN: 1. Pancreas cancer -CT abdomen/pelvis without contrast 02/16/2021-multiple large hypoechoic masses in the patient's liver consistent metastatic disease, ill-defined masslike area in the pancreatic head measuring approximate 4.7 cm, possible filling defect in the right ventricle. -EGD 02/19/2021-large infiltrative mass with bleeding in the second portion of the duodenum, appears to be arising near the ampulla, partially obstructive with friable mucosa.  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 -Flexible sigmoidoscopy 02/19/2021-diverticulosis in the sigmoid colon, nonbleeding external and internal hemorrhoids -Cardiac CT 02/20/2021-mass in the mid RV attached to the interventricular septum measuring 16 mm x 6 mm and concerning for metastatic tumor -MRI abdomen with/without contrast 02/21/2021-mass in the posterior pancreatic head measuring 4.3 x 3.8 cm with obstruction of the central pancreatic duct and diffuse dilatation up to 6 mm consistent with pancreatic adenocarcinoma, liver lesions the largest mass of the central left lobe measuring 9.9 x 9.2 cm consistent with hepatic metastatic disease,   hepatomegaly. -Ultrasound-guided biopsy of a left liver lesion 02/24/2021-adenocarcinoma, morphologically similar to the duodenal biopsy; microsatellite stable, tumor mutation burden 1 -Negative genetic testing -Palliative radiation to the duodenal mass 02/25/2021-03/06/2021 -Cycle 1 FOLFOX 03/17/2021 -Cycle 2 FOLFOX 04/01/2021 -Cycle 3 FOLFIRINOX 04/14/2021 -Cycle 4 FOLFIRINOX 04/27/2021, Udenyca added -Cycle 5 FOLFIRINOX 05/12/2021, Udenyca -MRI abdomen 05/23/2021-decrease in size of pancreas mass  and hepatic lesions, no progressive disease -Cycle 6 FOLFIRINOX 05/27/2021 2.  Iron deficiency anemia-improved -Feraheme 510 mg 02/20/2021 3.  Protein calorie malnutrition 4.  Possible right ventricular filling defect on noncontrast CT scan MRI cardiac morphology 02/21/2021-mass in the mid RV attached to the interventricular septum measures 16 mm x 6 mm.  Mass appears heterogeneous with area of enhancement on [facility] imaging.  Mass is concerning for metastatic tumor.  Normal LV size and systolic function.  Normal RV size and systolic function. 5.  Hypertension 6.  History of CVA 7.  Hyperlipidemia 8.  Hypothyroidism 9.  Morbid obesity 10.  Asthma 11.  Migraines 12.  Pain secondary to #1 13.  Port-A-Cath placement 02/25/2021 14.  Hypokalemia 04/01/2021-started a potassium supplement 15.  Hospital admission 06/17/2021-intractable nausea and vomiting EGD 06/19/2021-diffuse gastritis, nonobstructing duodenal mass, biopsy of stomach-no malignancy or H. pylori, duodenal mass-adenocarcinoma 16.  Hospital admission 07/12/2021 with nausea/vomiting, abdominal pain, and hypokalemia  Wendy Hoffman is readmitted with abdominal pain and nausea/vomiting.  She appears improved today. The G-J tube balloon was repositioned yesterday.She has not been using the G-tube port for venting. J-tube feedings can be resumed. Recommendations: 1. Resume J-tube feedings, nutrition consult, patient needs instruction for home tube  feedings/hydration 2.  Open G-tube to gravity for venting- I discussed with RN this AM 3.  Continue MS Contin, encourage use of roxanol for breakthrough pain 4.  Increase ambulation 5.  Outpatient f/u will be scheduled to begin gemcitabine/abraxane   LOS: 1 day   Betsy Coder, MD 07/15/21

## 2021-07-16 ENCOUNTER — Inpatient Hospital Stay: Payer: Medicaid Other | Admitting: Oncology

## 2021-07-16 ENCOUNTER — Telehealth: Payer: Self-pay | Admitting: Oncology

## 2021-07-16 ENCOUNTER — Inpatient Hospital Stay: Payer: Medicaid Other

## 2021-07-16 ENCOUNTER — Encounter: Payer: Self-pay | Admitting: *Deleted

## 2021-07-16 DIAGNOSIS — R112 Nausea with vomiting, unspecified: Secondary | ICD-10-CM | POA: Diagnosis not present

## 2021-07-16 LAB — GLUCOSE, CAPILLARY
Glucose-Capillary: 101 mg/dL — ABNORMAL HIGH (ref 70–99)
Glucose-Capillary: 114 mg/dL — ABNORMAL HIGH (ref 70–99)
Glucose-Capillary: 127 mg/dL — ABNORMAL HIGH (ref 70–99)
Glucose-Capillary: 89 mg/dL (ref 70–99)
Glucose-Capillary: 95 mg/dL (ref 70–99)
Glucose-Capillary: 95 mg/dL (ref 70–99)

## 2021-07-16 LAB — BASIC METABOLIC PANEL
Anion gap: 9 (ref 5–15)
BUN: <5 mg/dL — ABNORMAL LOW (ref 6–20)
CO2: 26 mmol/L (ref 22–32)
Calcium: 8.3 mg/dL — ABNORMAL LOW (ref 8.9–10.3)
Chloride: 105 mmol/L (ref 98–111)
Creatinine, Ser: 0.6 mg/dL (ref 0.44–1.00)
GFR, Estimated: >60 mL/min (ref >60)
Glucose, Bld: 127 mg/dL — ABNORMAL HIGH (ref 70–99)
Potassium: 3.2 mmol/L — ABNORMAL LOW (ref 3.5–5.1)
Sodium: 140 mmol/L (ref 135–145)

## 2021-07-16 LAB — MAGNESIUM
Magnesium: 1.8 mg/dL (ref 1.7–2.4)
Magnesium: 1.5 mg/dL — ABNORMAL LOW (ref 1.7–2.4)

## 2021-07-16 LAB — PHOSPHORUS
Phosphorus: 2.5 mg/dL (ref 2.5–4.6)
Phosphorus: 1.8 mg/dL — ABNORMAL LOW (ref 2.5–4.6)

## 2021-07-16 MED ORDER — MORPHINE SULFATE ER 30 MG PO TBCR
60.0000 mg | EXTENDED_RELEASE_TABLET | Freq: Two times a day (BID) | ORAL | Status: DC
  Administered 2021-07-16 – 2021-07-18 (×5): 60 mg via ORAL
  Filled 2021-07-16 (×5): qty 2

## 2021-07-16 MED ORDER — HYDROMORPHONE HCL 1 MG/ML IJ SOLN
1.0000 mg | Freq: Three times a day (TID) | INTRAMUSCULAR | Status: DC | PRN
  Administered 2021-07-16 – 2021-07-18 (×3): 1 mg via INTRAVENOUS
  Filled 2021-07-16 (×3): qty 1

## 2021-07-16 MED ORDER — MAGNESIUM SULFATE 2 GM/50ML IV SOLN
2.0000 g | Freq: Once | INTRAVENOUS | Status: AC
  Administered 2021-07-16: 2 g via INTRAVENOUS
  Filled 2021-07-16: qty 50

## 2021-07-16 NOTE — Progress Notes (Addendum)
HEMATOLOGY-ONCOLOGY PROGRESS NOTE Wendy Hoffman reports adequate pain control with the current narcotic regimen.  No vomiting.  The G-tube is now open to gravity.  No new complaint. 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  The report date is March 23, 2021.   The Multi-Cancer Panel with pancreatitis genes and preliminary pancreatic cancer genes offered by [facility] includes sequencing and/or deletion duplication testing of the following 91 genes: AIP, ALK, APC, ATM, AXIN2,BAP1,  BARD1, BLM, BMPR1A, BRCA1, BRCA2, BRIP1, CASR, CDC73, CDH1, CDK4, CDKN1B, CDKN1C, CDKN2A (p14ARF), CDKN2A (p16INK4a), CEBPA, CFTR, CHEK2, CPA1, CTNNA1, CTRC, DICER1, DIS3L2, EGFR (c.2369C>T, p.Thr790Met variant only), EPCAM (Deletion/duplication testing only), FANCC, FH, FLCN, GATA2, GPC3, GREM1 (Promoter region deletion/duplication testing only), HOXB13 (c.251G>A, p.Gly84Glu), HRAS, KIT, MAX, MEN1, MET, MITF (c.952G>A, p.Glu318Lys variant only), MLH1, MSH2, MSH3, MSH6, MUTYH, NBN, NF1, NF2, NTHL1, PALB2, PALLD, PDGFRA, PHOX2B, PMS2, POLD1, POLE, POT1, PRKAR1A, PRSS1, PTCH1, PTEN, RAD50, RAD51C, RAD51D, RB1, RECQL4, RET, RNF43, RUNX1, SDHAF2, SDHA (sequence changes only), SDHB, SDHC, SDHD, SMAD4, SMARCA4, SMARCB1, SMARCE1, SPINK1, STK11, SUFU, TERC, TERT, TMEM127, TP53, TSC1, TSC2, VHL, WRN and WT1.    07/16/2021 -  Chemotherapy    Patient is on Treatment Plan: PANCREATIC ABRAXANE / GEMCITABINE D1,8,15 Q28D        Pancreatic cancer ([place])  02/2021 Initial Diagnosis   Pancreatic cancer ([place])    07/16/2021 -  Chemotherapy    Patient is on Treatment Plan: PANCREATIC ABRAXANE / GEMCITABINE D1,8,15 Q28D        PHYSICAL EXAMINATION:  Vitals:   07/15/21 1933 07/16/21 0334  BP: 113/71 100/61  Pulse: 90 88  Resp: 15 15  Temp: 98.1 F (36.7 C) 99.1 F (37.3 C)  SpO2: 98% 98%   Intake/Output from previous day: 08/03 0701 - 08/04 0700 In: 1320 [P.O.:480; NG/GT:500] Out: 1250 [Urine:1050; Drains:200]  OROPHARYNX: No thrush  Cardiac: Regular rate and rhythm ABDOMEN: Soft, no mass, no hepatomegaly, tender in the mid and right upper abdomen, mid upper abdomen G-tube site without erythema site.   vascular: No leg edema LABORATORY DATA:  I have reviewed the data as listed CMP Latest Ref Rng & Units 07/16/2021 07/15/2021 07/14/2021  Glucose 70 - 99 mg/dL 127(H) 85 82  BUN 6 - 20 mg/dL <5(L) <5(L) 7  Creatinine 0.44 - 1.00 mg/dL 0.60 0.57 0.61  Sodium 135 - 145 mmol/L 140 139 140  Potassium 3.5 - 5.1 mmol/L 3.2(L) 3.1(L) 2.9(L)  Chloride 98 - 111 mmol/L 105 108 107  CO2 22 - 32 mmol/L 26 26 20(L)  Calcium 8.9 - 10.3 mg/dL 8.3(L) 8.3(L) 8.4(L)  Total Protein 6.5 - 8.1 g/dL - 5.3(L) 5.1(L)  Total Bilirubin 0.3 - 1.2 mg/dL - 0.5 0.6  Alkaline Phos 38 - 126 U/L - 72 64  AST 15 - 41 U/L - 15 15  ALT 0 - 44 U/L - 11 11    Lab Results  Component Value Date   WBC 3.5 (L) 07/15/2021   HGB 8.2 (L) 07/15/2021  HCT 25.5 (L) 07/15/2021   MCV 94.4 07/15/2021   PLT 250 07/15/2021   NEUTROABS 1.9 07/15/2021    CT ABDOMEN PELVIS WO CONTRAST  Result Date: 06/17/2021 CLINICAL DATA:  Rule out bowel obstruction. History of pancreas cancer. EXAM: CT ABDOMEN AND PELVIS WITHOUT CONTRAST TECHNIQUE: Multidetector CT imaging of the abdomen and pelvis was performed following the standard protocol without IV contrast. COMPARISON:  05/23/2021 FINDINGS: Lower chest: No acute abnormality. Hepatobiliary: Within a  background of diffuse hepatic steatosis there are multiple liver metastases as noted previously: The dominant lesion within the lateral segment of left hepatic lobe measures 7.8 cm, image 26/2. Previously this measured the same. The dominant lesion within the right hepatic lobe measures 8.2 cm, image 25/2. Previously 8 cm. Status post cholecystectomy. No biliary dilatation. Pancreas: Head of pancreas mass is suboptimally evaluated due to lack of IV contrast material. Subjectively, this does not appear significantly changed in size when compared with the previous contrast enhanced MRI. Spleen: Normal in size without focal abnormality. Adrenals/Urinary Tract: Normal adrenal glands. No hydronephrosis identified bilaterally. Punctate stone noted within inferior pole of left kidney. Exophytic cyst is again noted arising off the posterior cortex of the interpolar right kidney measuring 1.3 cm. Urinary bladder appears collapsed. Stomach/Bowel: Stomach is nondistended. The appendix is visualized and appears normal. No pathologic dilatation of the large or small bowel loops. No significant bowel wall thickening or surrounding inflammatory fat stranding. Vascular/Lymphatic: Mild aortic atherosclerosis. No aneurysm. No abdominopelvic adenopathy identified. Reproductive: Status post hysterectomy. No adnexal masses.  The Other: No ascites or focal fluid collections. No peritoneal nodularity identified at this time. Musculoskeletal: No acute or significant osseous findings. IMPRESSION: 1. No acute findings within the abdomen or pelvis. No evidence for bowel obstruction. 2. Multiple liver metastases.  Similar to previous exam. 3. Head of pancreas mass is suboptimally evaluated due to lack of IV contrast material. Subjectively, this does not appear significantly changed in size when compared with the previous contrast enhanced MRI. 4. Aortic atherosclerosis. Aortic Atherosclerosis (ICD10-I70.0). Electronically Signed   By: Kerby Moors M.D.   On: 06/17/2021 19:07   DG THORACOLUMABAR SPINE  Result Date: 06/28/2021 CLINICAL DATA:  Low back pain for 1 day, history of metastatic pancreatic cancer EXAM: THORACOLUMBAR SPINE 1V COMPARISON:  06/26/2021 FINDINGS: Frontal and lateral views of the thoracolumbar spine are obtained. There are no acute or destructive bony lesions. Mild spondylosis is seen within the mid to lower thoracic spine. Severe facet hypertrophic changes are seen within the mid to lower lumbar spine. Sacroiliac joints are normal. Visualized bowel gas pattern is unremarkable. IMPRESSION: 1. Mild mid to lower thoracic spondylosis, with severe lower lumbar facet hypertrophy. 2. No acute or destructive bony lesions. Electronically Signed   By: Randa Ngo M.D.   On: 06/28/2021 15:22   MR BRAIN W WO CONTRAST  Result Date: 07/05/2021 CLINICAL DATA:  Staging of gastrointestinal small cancer. History of pancreatic carcinoma. EXAM: MRI HEAD WITHOUT AND WITH CONTRAST TECHNIQUE: Multiplanar, multiecho pulse sequences of the brain and surrounding structures were obtained without and with intravenous contrast. CONTRAST:  72m GADAVIST GADOBUTROL 1 MMOL/ML IV SOLN COMPARISON:  None. FINDINGS: Brain: No acute infarct, mass effect or extra-axial collection. No acute or chronic hemorrhage. There is multifocal hyperintense T2-weighted signal within the white matter. Parenchymal volume and CSF spaces are normal. A partially empty sella is incidentally noted. There is no abnormal contrast enhancement. Vascular: Major flow voids are preserved. Skull and upper cervical spine: Normal calvarium and  skull base. Visualized upper cervical spine and soft tissues are normal. Sinuses/Orbits:No paranasal sinus fluid levels or advanced mucosal thickening. No mastoid or middle ear effusion. Normal orbits. IMPRESSION: 1. No intracranial metastatic disease. 2. Multifocal hyperintense T2-weighted signal within the white matter, likely due to  arteriosclerosis. Electronically Signed   By: Ulyses Jarred M.D.   On: 07/05/2021 19:37   CT ABDOMEN PELVIS W CONTRAST  Result Date: 07/12/2021 CLINICAL DATA:  Abdominal pain and distension. Recent G-tube. History of pancreatic cancer. EXAM: CT ABDOMEN AND PELVIS WITH CONTRAST TECHNIQUE: Multidetector CT imaging of the abdomen and pelvis was performed using the standard protocol following bolus administration of intravenous contrast. CONTRAST:  38m OMNIPAQUE IOHEXOL 350 MG/ML SOLN COMPARISON:  Most recent CT 06/26/2021 FINDINGS: Lower chest: Central line tip in the right ventricle. Heart is normal in size. No basilar nodule, focal airspace disease or pleural effusion. Hepatobiliary: Multiple hepatic masses are again seen, less well-defined on the current exam due to phase of contrast. Index lesion in the left lobe measures 7.5 x 5.8 cm, series 2, image 14, previously 8.0 x 6.1 cm. Index lesion in the right lobe measures 7.8 x 4.1 cm, series 2, image 12, previously 7.9 x 4.5 cm. There additional smaller lesions in the right and left hepatic lobe. The gallbladder is surgically absent. There is no intra or extrahepatic biliary ductal dilatation. Pancreas: Ill-defined hypoenhancing pancreatic head mass measures approximately 2.3 x 3.1 cm, series 2, image 38, previously 2.8 x 3.1 cm. Slight pancreatic ductal prominence and parenchymal atrophy are stable. No acute peripancreatic inflammation Spleen: Normal in size without focal abnormality. Splenic vein is patent. Adrenals/Urinary Tract: No adrenal nodule. No hydronephrosis or renal calculi. No focal renal lesion. Unremarkable urinary bladder for degree of distension. Stomach/Bowel: New gastrostomy tube is in place. The balloon is not opposed to the gastric or abdominal wall. There is mild edema and fat stranding along the subcutaneous portion of the gastrostomy tube but no focal fluid collection. Tip of the gastrostomy tube is in the proximal jejunum. The stomach is  decompressed. Fluid/liquid stool in the colon with suggestion of mild colonic wall thickening at the hepatic flexure and proximal transverse, series 2, image 49. There is also wall thickening and mesenteric edema involving pelvic bowel small bowel loops. No pneumatosis, obstruction, or abnormal distention. Normal appendix. Vascular/Lymphatic: Mild aortic atherosclerosis without aneurysm. Patent portal and splenic veins. No mesenteric or portal venous gas. No definite peripancreatic adenopathy. No enlarged lymph nodes in the abdomen or pelvis. Reproductive: Hysterectomy.  Quiescent ovaries.  No adnexal mass. Other: Small volume of free fluid in the pelvis and right greater than left pericolic gutter, likely reactive. No definite omental thickening. No free air. Musculoskeletal: Sclerotic focus within posterior L3 vertebra is unchanged from prior. No acute osseous abnormalities are seen. IMPRESSION: 1. New gastrostomy tube in place. The balloon is within the gastric lumen, not opposed to the gastric or abdominal wall. There is mild edema and fat stranding along the subcutaneous portion of the gastrostomy tube but no focal fluid collection. 2. Fluid/liquid stool in the colon with suggestion of mild colonic wall thickening at the hepatic flexure and proximal transverse. There is also wall thickening and mesenteric edema involving pelvic bowel loops. Findings suspicious for enteritis/colitis. 3. Multiple hepatic masses, slightly decreased in size from prior exam. 4. Pancreatic head mass is unchanged in size from prior exam. 5. Small sclerotic focus within L3 vertebra is nonspecific in the setting. This is unchanged from prior exam. Aortic Atherosclerosis (  ICD10-I70.0). Electronically Signed   By: Keith Rake M.D.   On: 07/12/2021 21:48   CT ABDOMEN PELVIS W CONTRAST  Result Date: 06/26/2021 CLINICAL DATA:  Worsening nausea and vomiting over the last week, pancreatic cancer undergoing chemotherapy EXAM: CT  ABDOMEN AND PELVIS WITH CONTRAST TECHNIQUE: Multidetector CT imaging of the abdomen and pelvis was performed using the standard protocol following bolus administration of intravenous contrast. CONTRAST:  31m OMNIPAQUE IOHEXOL 350 MG/ML SOLN COMPARISON:  06/17/2021, 05/23/2021, 02/16/2021 FINDINGS: Lower chest: No acute pleural or parenchymal lung disease. Central venous catheter tip within the right atrium near the junction with the IVC. No pericardial effusion. Hepatobiliary: Multiple hepatic masses are again identified. Largest lesion within the left lobe liver reference image 27/2 measures 8.0 x 6.1 cm, previously measuring 7.6 x 6.0 cm on MRI 05/23/2021. Largest lesion within the right lobe liver measures up to 8.2 x 4.5 cm on image 23/2, previously measuring 7.6 x 3.7 cm on MRI. Smaller lesions throughout the left lobe liver more inferiorly are grossly unchanged, though less well visualized on CT compared to MRI. No new lesions are definitively identified. No intrahepatic biliary duct dilation. Gallbladder is surgically absent. Pancreas: The ill-defined mass within the pancreatic head measures up to approximately 2.8 x 3.1 cm reference image 65/2, previously having measured 2.9 x 2.9 cm on MRI 05/23/2021. Mild pancreatic duct dilation and upstream pancreatic parenchymal atrophy again noted unchanged. Spleen: No focal splenic abnormalities. Mild splenomegaly unchanged. Adrenals/Urinary Tract: No urinary tract calculi or obstructive uropathy within either kidney. The kidneys enhance normally and symmetrically. Small left renal cortical cysts unchanged. The adrenals are unremarkable. Bladder is only minimally distended with no gross abnormalities. Stomach/Bowel: No bowel obstruction or ileus. Normal retrocecal appendix. No bowel wall thickening or inflammatory change. Vascular/Lymphatic: Mild atherosclerosis of the aorta and iliac vessels unchanged. No discrete adenopathy within the abdomen or pelvis.  Reproductive: Status post hysterectomy. No adnexal masses. Other: Trace pelvic free fluid. No free intraperitoneal gas. No abdominal wall hernia. Musculoskeletal: No acute or destructive bony lesions. Reconstructed images demonstrate no additional findings. IMPRESSION: 1. Grossly stable mass within the pancreatic head compatible with known pancreatic cancer. 2. No significant change in the size and number of known liver metastases, allowing for differences in technique compared to previous MRI. 3. Trace pelvic free fluid. 4.  Aortic Atherosclerosis (ICD10-I70.0). Electronically Signed   By: MRanda NgoM.D.   On: 06/26/2021 17:01   IR GASTROSTOMY TUBE MOD SED  Result Date: 07/01/2021 INDICATION: 55year old woman with history metastatic pancreatic cancer presents to interventional radiology for GMcCormicktube placement for treatment of nausea and vomiting likely related to compression of the stomach from hepatic metastases. EXAM: Fluoroscopy guided GJ tube placement MEDICATIONS: Vancomycin 1 gm IV; Antibiotics were administered within 1 hour of the procedure. Glucagon 1 mg IV ANESTHESIA/SEDATION: Versed 3 mg IV; Fentanyl 150 mcg IV Moderate Sedation Time:  63 minutes The patient was continuously monitored during the procedure by the interventional radiology nurse under my direct supervision. CONTRAST:  10 mL of Omnipaque 300-administered into the gastric lumen. FLUOROSCOPY TIME:  Fluoroscopy Time: 8 minutes 12 seconds (229 mGy). COMPLICATIONS: None immediate. PROCEDURE: Informed written consent was obtained from the patient after a thorough discussion of the procedural risks, benefits and alternatives. All questions were addressed. Maximal Sterile Barrier Technique was utilized including caps, mask, sterile gowns, sterile gloves, sterile drape, hand hygiene and skin antiseptic. A timeout was performed prior to the initiation of the procedure. The inferior margin of the  liver was marked utilizing ultrasound guidance.  An orogastric tube was placed with fluoroscopic guidance. The anterior abdomen was prepped and draped in sterile fashion. Ultrasound evaluation of the left upper quadrant was performed to confirm the position of the liver. The skin and subcutaneous tissues were anesthetized with 1% lidocaine. Single gastropexy was placed utilizing fluoroscopic guidance. 19 gauge needle was directed into the distended stomach with fluoroscopic guidance. A wire was advanced into the stomach. 9-French vascular sheath was placed and the orogastric tube was snared using a Gooseneck snare device. Guidewire was advanced through the orogastric tube and snared. The snare and wire were pulled out of the patient's mouth. The snare device was connected to a 20-French gastrostomy tube. The snare device and gastrostomy tube were pulled through the patient's mouth and out the anterior abdominal wall. The gastrostomy tube was cut to an appropriate length. Contrast injection through gastrostomy tube confirmed placement within the stomach. Kumpe catheter and Glidewire advanced through the gastrostomy tube to the proximal jejunum. 9 French jejunostomy tube was inserted through the G-tube with tip positioned in the proximal jejunum. Position of the J limb was conformed by administering contrast under fluoroscopy. The gastrojejunostomy tube was flushed with normal saline. IMPRESSION: Gastrojejunostomy tube placed utilizing fluoroscopic guidance. The gastrostomy tube is 65 [place] while the jejunostomy tube is 45 [place]. Electronically Signed   By: Miachel Roux M.D.   On: 07/01/2021 16:41   VAS [place] LOWER EXTREMITY VENOUS (DVT)  Result Date: 06/29/2021  Lower Venous DVT Study Patient Name:  DONYEL CASTAGNOLA  Date of Exam:   06/29/2021 Medical Rec #: [id number]        Accession #:    [id number] Date of Birth: [birth year]        Patient Gender: F Patient Age:   89F Exam Location:  [facility] Procedure:      VAS [place] LOWER EXTREMITY VENOUS (DVT)  Referring Phys: [place] --------------------------------------------------------------------------------  Indications: RLE swelling.  Risk Factors: Chemotherapy Cancer Pancreatic w/ mets. Limitations: Poor ultrasound/tissue interface. Comparison Study: No previous exams Performing Technologist: Jody Hill RVT, RDMS  Examination Guidelines: A complete evaluation includes B-mode imaging, spectral Doppler, color Doppler, and power Doppler as needed of all accessible portions of each vessel. Bilateral testing is considered an integral part of a complete examination. Limited examinations for reoccurring indications may be performed as noted. The reflux portion of the exam is performed with the patient in reverse Trendelenburg.  +---------+---------------+---------+-----------+----------+--------------+ RIGHT    CompressibilityPhasicitySpontaneityPropertiesThrombus Aging +---------+---------------+---------+-----------+----------+--------------+ CFV      Full           Yes      Yes                                 +---------+---------------+---------+-----------+----------+--------------+ SFJ      Full                                                        +---------+---------------+---------+-----------+----------+--------------+ FV Prox  Full           Yes      Yes                                 +---------+---------------+---------+-----------+----------+--------------+  FV Mid   Full           Yes      Yes                                 +---------+---------------+---------+-----------+----------+--------------+ FV DistalFull           Yes      Yes                                 +---------+---------------+---------+-----------+----------+--------------+ PFV      Full                                                        +---------+---------------+---------+-----------+----------+--------------+ POP      Full           Yes      Yes                                  +---------+---------------+---------+-----------+----------+--------------+ PTV      Full                                                        +---------+---------------+---------+-----------+----------+--------------+ PERO     Full                                                        +---------+---------------+---------+-----------+----------+--------------+   +---------+---------------+---------+-----------+----------+--------------+ LEFT     CompressibilityPhasicitySpontaneityPropertiesThrombus Aging +---------+---------------+---------+-----------+----------+--------------+ CFV      Full           Yes      Yes                                 +---------+---------------+---------+-----------+----------+--------------+ SFJ      Full                                                        +---------+---------------+---------+-----------+----------+--------------+ FV Prox  Full           Yes      Yes                                 +---------+---------------+---------+-----------+----------+--------------+ FV Mid   Full           Yes      Yes                                 +---------+---------------+---------+-----------+----------+--------------+ FV DistalFull  Yes      Yes                                 +---------+---------------+---------+-----------+----------+--------------+ PFV      Full                                                        +---------+---------------+---------+-----------+----------+--------------+ POP      Full           Yes      Yes                                 +---------+---------------+---------+-----------+----------+--------------+ PTV      Full                                                        +---------+---------------+---------+-----------+----------+--------------+ PERO     Full                                                         +---------+---------------+---------+-----------+----------+--------------+     Summary: BILATERAL: - No evidence of deep vein thrombosis seen in the lower extremities, bilaterally. - No evidence of superficial venous thrombosis in the lower extremities, bilaterally. -No evidence of popliteal cyst, bilaterally. RIGHT: - Ultrasound characteristics of enlarged lymph nodes are noted in the groin.  LEFT: - Ultrasound characteristics of enlarged lymph nodes noted in the groin.  *See table(s) above for measurements and observations. Electronically signed by Ruta Hinds MD on 06/29/2021 at 7:52:11 PM.    Final     ASSESSMENT AND PLAN: 1. Pancreas cancer -CT abdomen/pelvis without contrast 02/16/2021-multiple large hypoechoic masses in the patient's liver consistent metastatic disease, ill-defined masslike area in the pancreatic head measuring approximate 4.7 cm, possible filling defect in the right ventricle. -EGD 02/19/2021-large infiltrative mass with bleeding in the second portion of the duodenum, appears to be arising near the ampulla, partially obstructive with friable mucosa.  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 -Flexible sigmoidoscopy 02/19/2021-diverticulosis in the sigmoid colon, nonbleeding external and internal hemorrhoids -Cardiac CT 02/20/2021-mass in the mid RV attached to the interventricular septum measuring 16 mm x 6 mm and concerning for metastatic tumor -MRI abdomen with/without contrast 02/21/2021-mass in the posterior pancreatic head measuring 4.3 x 3.8 cm with obstruction of the central pancreatic duct and diffuse dilatation up to 6 mm consistent with pancreatic adenocarcinoma, liver lesions the largest mass of the central left lobe measuring 9.9 x 9.2 cm consistent with hepatic metastatic disease,  hepatomegaly. -Ultrasound-guided biopsy of a left liver lesion 02/24/2021-adenocarcinoma, morphologically similar to the duodenal biopsy; microsatellite stable, tumor  mutation burden 1 -Negative genetic testing -Palliative radiation to the duodenal mass 02/25/2021-03/06/2021 -Cycle 1 FOLFOX 03/17/2021 -Cycle 2 FOLFOX 04/01/2021 -Cycle 3 FOLFIRINOX 04/14/2021 -Cycle 4 FOLFIRINOX 04/27/2021, Udenyca added -Cycle 5 FOLFIRINOX 05/12/2021, Udenyca -MRI abdomen 05/23/2021-decrease in size of pancreas mass  and hepatic lesions, no progressive disease -Cycle 6 FOLFIRINOX 05/27/2021 2.  Iron deficiency anemia-improved -Feraheme 510 mg 02/20/2021 3.  Protein calorie malnutrition 4.  Possible right ventricular filling defect on noncontrast CT scan MRI cardiac morphology 02/21/2021-mass in the mid RV attached to the interventricular septum measures 16 mm x 6 mm.  Mass appears heterogeneous with area of enhancement on LGE imaging.  Mass is concerning for metastatic tumor.  Normal LV size and systolic function.  Normal RV size and systolic function. 5.  Hypertension 6.  History of CVA 7.  Hyperlipidemia 8.  Hypothyroidism 9.  Morbid obesity 10.  Asthma 11.  Migraines 12.  Pain secondary to #1 13.  Port-A-Cath placement 02/25/2021 14.  Hypokalemia 04/01/2021-started a potassium supplement 15.  Hospital admission 06/17/2021-intractable nausea and vomiting EGD 06/19/2021-diffuse gastritis, nonobstructing duodenal mass, biopsy of stomach-no malignancy or H. pylori, duodenal mass-adenocarcinoma 16.  Hospital admission 07/12/2021 with nausea/vomiting, abdominal pain, and hypokalemia  Wendy Hoffman appears improved since hospital admission.  Tube feedings have been resumed.  Her pain appears to be adequately controlled at present.  She underwent observation for refeeding syndrome when she was here last week.  Wendy Hoffman was scheduled to begin chemotherapy as an outpatient today.  We will reschedule an office visit and chemotherapy for 07/23/2021. Recommendations: 1.  Continue tube feedings, be sure patient is instructed on feeding/hydration prior to discharge 2.  Use venting gastrostomy  as needed for nausea 3.  I would resume MS Contin and use Roxanol sublingual as needed, she can clamp the G-tube after taking MS Contin, can use scheduled Roxanol if she is unable to tolerate the MS Contin 4.  Increase ambulation 5.  Outpatient follow-up at the Cancer center 07/23/2021 6.  Please call Oncology as needed, I will be out until 07/20/2021   LOS: 2 days   Betsy Coder, MD 07/16/21

## 2021-07-16 NOTE — Telephone Encounter (Signed)
Scheduled appt per 8/4 sch msg - pt is aware of appt on 8/12 - not on 8/11 due to availability.

## 2021-07-16 NOTE — Progress Notes (Signed)
Still in hospital. Canceled today. Scheduling message sent for lab/flush/ov and tx on 8/11

## 2021-07-16 NOTE — TOC Progression Note (Signed)
Transition of Care [facility]) - Progression Note    Patient Details  Name: Wendy Hoffman MRN: [id number] Date of Birth: Aug 15, 1966  Transition of Care [facility]) CM/SW [place], [place] Phone Number: 07/16/2021, 2:19 PM  Clinical Narrative:   Informed patient of my inability to secure [facility] RN provider.  She was very generous in her acceptance of this news.  [name] did state that she feels so much better about returning home this time due to the good training she has received about her tube feeds from both [place] with [employer] and [employer].  She is also appreciative about the training and supplies re: her drain tube.  She states she feels ready to return home whenever the MD determines she is medically stable.  Finally, although her niece was not able to come in for training on tube feeds, she was sent a link to virtual training so that she can be knowledgeable as well. TOC will continue to follow during the course of hospitalization.      Expected Discharge Plan: [place] Barriers to Discharge: Barriers Resolved  Expected Discharge Plan and Services Expected Discharge Plan: [place] Choice: [place] arrangements for the past 2 months: Apartment Expected Discharge Date:  (unknown)                                     Social Determinants of Health (SDOH) Interventions    Readmission Risk Interventions Readmission Risk Prevention Plan 06/23/2021  Transportation Screening Complete  Medication Review Press photographer) Complete  HRI or [place] Complete  SW Recovery Care/Counseling Consult Complete  Palliative Care Screening Not [place] Not Applicable  Some recent data might be hidden

## 2021-07-16 NOTE — Progress Notes (Signed)
Sepsis PROGRESS NOTE    Trang Hell  [id number] DOB: Apr 19, 1966 DOA: 07/12/2021 PCP: Patient, No Pcp Per (Inactive)    Brief Narrative:  55 year old female with history of pancreatic cancer, hypertension, protein calorie malnutrition with recent G-tube placement by IR on 7/20 and is started on tube feedings and discharged home on 7/26 presented to the emergency room with recurrent nausea, nonbloody vomiting and diarrhea and increasing pain around the feeding tube.  In the emergency room hemodynamically stable.  Slightly tachycardic.  Hypokalemic.  CT scan abdomen pelvis with some stranding around the subcutaneous portion of the tube without fluid collection.  There was some evidence of colonic wall thickening suggestive of colitis.  Started on IV antibiotics, pain control with IV opiates and admitted to the hospital.  Tube feeding is  started after manipulation of G-tube by radiology.  Assessment & Plan:   Principal Problem:   Intractable nausea and vomiting Active Problems:   Asthma   Malignant neoplasm of head of pancreas (HCC)   Hypokalemia   UTI (urinary tract infection)   Colitis   Cellulitis  Intractable nausea and vomiting: Related to underlying pancreatic cancer and intolerance to tube feeding.  Patient on symptomatic treatment with around-the-clock nausea medications, pain medications. Symptomatically improving. Examining with tube feeding.  Can still take oral food and medications. Scopolamine patch helped with nausea.  G-tube malfunction: Seen by radiology.  Readjusted.  Started on tube feeding . We will make sure she can tolerate that before she go home. CT scan evidence with some colitis, less likely bacterial infection.  Discontinued all antibiotics to avoid unnecessary complications.  No more diarrhea.  Diet as tolerated.  Pancreatic cancer: Followed by Dr. Learta Codding.  They are planning to start patient on chemotherapy in the near future.  Chronic pain syndrome: As  above.  Hypokalemia: Replaced.  GERD: On PPI.  Will change to p.o. on discharge.  Essential hypertension: On blood pressure medication with amlodipine.  Change to oral pain medications.  Mobilize.  Monitor for tube feeding tolerance.  Also looking for home health agencies to help her at home.  DVT prophylaxis: enoxaparin (LOVENOX) injection 40 mg Start: 07/12/21 2245   Code Status: Full code Family Communication: None. Disposition Plan: Status is: Inpatient  Remains inpatient appropriate because:Inpatient level of care appropriate due to severity of illness  Dispo: The patient is from: Home              Anticipated d/c is to: Home with home health.              Patient currently is not medically stable to d/c.   Difficult to place patient No         Consultants:  Oncology Interventional radiology  Procedures:  None  Antimicrobials:  Antibiotics Given (last 72 hours)     Date/Time Action Medication Dose Rate   07/13/21 1315 New Bag/Given   ceFEPIme (MAXIPIME) 2 g in sodium chloride 0.9 % 100 mL IVPB 2 g 200 mL/hr   07/13/21 1449 New Bag/Given   metroNIDAZOLE (FLAGYL) IVPB 500 mg 500 mg 100 mL/hr   07/13/21 2201 New Bag/Given   ceFEPIme (MAXIPIME) 2 g in sodium chloride 0.9 % 100 mL IVPB 2 g 200 mL/hr   07/13/21 2300 New Bag/Given   metroNIDAZOLE (FLAGYL) IVPB 500 mg 500 mg 100 mL/hr   07/14/21 0516 New Bag/Given   ceFEPIme (MAXIPIME) 2 g in sodium chloride 0.9 % 100 mL IVPB 2 g 200 mL/hr   07/14/21 [id number] New  Bag/Given   metroNIDAZOLE (FLAGYL) IVPB 500 mg 500 mg 100 mL/hr   07/14/21 1251 New Bag/Given   cefTRIAXone (ROCEPHIN) 2 g in sodium chloride 0.9 % 100 mL IVPB 2 g 200 mL/hr   07/14/21 1334 New Bag/Given   metroNIDAZOLE (FLAGYL) IVPB 500 mg 500 mg 100 mL/hr   07/15/21 0040 New Bag/Given   metroNIDAZOLE (FLAGYL) IVPB 500 mg 500 mg 100 mL/hr   07/15/21 [id number] New Bag/Given   metroNIDAZOLE (FLAGYL) IVPB 500 mg 500 mg 100 mL/hr   07/15/21 1053 New Bag/Given    cefTRIAXone (ROCEPHIN) 2 g in sodium chloride 0.9 % 100 mL IVPB 2 g 200 mL/hr          Subjective: Patient seen and examined.  No overnight events.  Tolerating tube feeding. She is trying to have some more help at home before going home. Still has some pain around the tube insertion site, however bearable. Patient can take all her medications by mouth.  Objective: Vitals:   07/15/21 1917 07/15/21 1933 07/16/21 0334 07/16/21 0418  BP:  113/71 100/61   Pulse:  90 88   Resp:  15 15   Temp:  98.1 F (36.7 C) 99.1 F (37.3 C)   TempSrc:  Oral Oral   SpO2: 98% 98% 98%   Weight:    95.3 kg  Height:        Intake/Output Summary (Last 24 hours) at 07/16/2021 1121 Last data filed at 07/16/2021 0909 Gross per 24 hour  Intake 1048 ml  Output 1250 ml  Net -202 ml   Filed Weights   07/13/21 0139 07/16/21 0418  Weight: 89.9 kg 95.3 kg    Examination:  General exam: Appears calm and comfortable.   Respiratory system: Clear to auscultation. Respiratory effort normal. Cardiovascular system: S1 & S2 heard, RRR.  Gastrointestinal system: Soft.  Mild tenderness along the epigastrium on deep palpation.  No rigidity or guarding.  G-tube remains intact.  Tube feeding running.  G-tube on vent. Central nervous system: Alert and oriented. No focal neurological deficits. Extremities: Symmetric 5 x 5 power. Skin: No rashes, lesions or ulcers Psychiatry: Judgement and insight appear normal.      Data Reviewed: I have personally reviewed following labs and imaging studies  CBC: Recent Labs  Lab 07/12/21 1922 07/13/21 1000 07/14/21 0500 07/15/21 0349  WBC 8.5 4.9 3.6* 3.5*  NEUTROABS 7.2 3.1 2.2 1.9  HGB 11.3* 9.2* 8.5* 8.2*  HCT 34.9* 28.8* 26.7* 25.5*  MCV 92.1 93.5 95.0 94.4  PLT 394 293 269 [id number]   Basic Metabolic Panel: Recent Labs  Lab 07/12/21 1922 07/13/21 1000 07/13/21 1000 07/14/21 0500 07/15/21 0349 07/15/21 1227 07/15/21 2034 07/16/21 0350  NA 140 140  --  140 139   --   --  140  K 2.6* 3.0*  --  2.9* 3.1*  --   --  3.2*  CL 100 108  --  107 108  --   --  105  CO2 27 26  --  20* 26  --   --  26  GLUCOSE 119* 87  --  82 85  --   --  127*  BUN 9 9  --  7 <5*  --   --  <5*  CREATININE 0.83 0.77  --  0.61 0.57  --   --  0.60  CALCIUM 9.3 8.5*  --  8.4* 8.3*  --   --  8.3*  MG  --  1.8   < > 1.9  1.7 2.3 1.9 1.8  PHOS  --  3.7   < > 3.7 3.2 3.0 2.7 2.5   < > = values in this interval not displayed.   GFR: Estimated Creatinine Clearance: 91 mL/min (by C-G formula based on SCr of 0.6 mg/dL). Liver Function Tests: Recent Labs  Lab 07/12/21 1922 07/13/21 1000 07/14/21 0500 07/15/21 0349  AST '20 17 15 15  '$ ALT '15 11 11 11  '$ ALKPHOS 101 77 64 72  BILITOT 0.8 0.6 0.6 0.5  PROT 6.8 5.6* 5.1* 5.3*  ALBUMIN 3.3* 2.8* 2.5* 2.7*   Recent Labs  Lab 07/12/21 1922  LIPASE 24   No results for input(s): AMMONIA in the last 168 hours. Coagulation Profile: No results for input(s): INR, PROTIME in the last 168 hours. Cardiac Enzymes: No results for input(s): CKTOTAL, CKMB, CKMBINDEX, TROPONINI in the last 168 hours. BNP (last 3 results) No results for input(s): PROBNP in the last 8760 hours. HbA1C: No results for input(s): HGBA1C in the last 72 hours. CBG: Recent Labs  Lab 07/15/21 1121 07/15/21 1656 07/15/21 2332 07/16/21 0335 07/16/21 0758  GLUCAP 89 97 116* 114* 127*   Lipid Profile: No results for input(s): CHOL, HDL, LDLCALC, TRIG, CHOLHDL, LDLDIRECT in the last 72 hours. Thyroid Function Tests: No results for input(s): TSH, T4TOTAL, FREET4, T3FREE, THYROIDAB in the last 72 hours. Anemia Panel: Recent Labs    07/14/21 0803  VITAMINB12 604  FOLATE 17.6  FERRITIN 186  TIBC 157*  IRON 37  RETICCTPCT 1.7   Sepsis Labs: No results for input(s): PROCALCITON, LATICACIDVEN in the last 168 hours.  Recent Results (from the past 240 hour(s))  Urine Culture     Status: Abnormal   Collection Time: 07/12/21  7:22 PM   Specimen: Urine, Clean  Catch  Result Value Ref Range Status   Specimen Description   Final    URINE, CLEAN CATCH Performed at [facility], [place] [address] [place], [place] [address]    Special Requests   Final    Immunocompromised Performed at [facility], [place] [address] [place], [place] [address]    Culture (A)  Final    <10,000 COLONIES/mL INSIGNIFICANT GROWTH Performed at [place] [address] [place], [place] [address]    Report Status 07/14/2021 FINAL  Final  Resp Panel by RT-PCR (Flu A&B, Covid) Nasopharyngeal Swab     Status: None   Collection Time: 07/12/21  9:55 PM   Specimen: Nasopharyngeal Swab; Nasopharyngeal(NP) swabs in vial transport medium  Result Value Ref Range Status   SARS Coronavirus 2 by RT PCR NEGATIVE NEGATIVE Final    Comment: (NOTE) SARS-CoV-2 target nucleic acids are NOT DETECTED.  The SARS-CoV-2 RNA is generally detectable in upper respiratory specimens during the acute phase of infection. The lowest concentration of SARS-CoV-2 viral copies this assay can detect is 138 copies/mL. A negative result does not preclude SARS-Cov-2 infection and should not be used as the sole basis for treatment or other patient management decisions. A negative result may occur with  improper specimen collection/handling, submission of specimen other than nasopharyngeal swab, presence of viral mutation(s) within the areas targeted by this assay, and inadequate number of viral copies(<138 copies/mL). A negative result must be combined with clinical observations, patient history, and epidemiological information. The expected result is Negative.  Fact Sheet for Patients:  EntrepreneurPulse.com.au  Fact Sheet for Healthcare Providers:  IncredibleEmployment.be  This test is no t yet approved or cleared by the [place]  States FDA and  has been authorized for detection and/or diagnosis of SARS-CoV-2 by FDA  under an Emergency Use Authorization (EUA). This EUA will remain  in effect (meaning this test can be used) for the duration of the COVID-19 declaration under Section 564(b)(1) of the Act, 21 U.S.C.section 360bbb-3(b)(1), unless the authorization is terminated  or revoked sooner.       Influenza A by PCR NEGATIVE NEGATIVE Final   Influenza B by PCR NEGATIVE NEGATIVE Final    Comment: (NOTE) The Xpert Xpress SARS-CoV-2/FLU/RSV plus assay is intended as an aid in the diagnosis of influenza from Nasopharyngeal swab specimens and should not be used as a sole basis for treatment. Nasal washings and aspirates are unacceptable for Xpert Xpress SARS-CoV-2/FLU/RSV testing.  Fact Sheet for Patients: EntrepreneurPulse.com.au  Fact Sheet for Healthcare Providers: IncredibleEmployment.be  This test is not yet approved or cleared by the [place] FDA and has been authorized for detection and/or diagnosis of SARS-CoV-2 by FDA under an Emergency Use Authorization (EUA). This EUA will remain in effect (meaning this test can be used) for the duration of the COVID-19 declaration under Section 564(b)(1) of the Act, 21 U.S.C. section 360bbb-3(b)(1), unless the authorization is terminated or revoked.  Performed at [facility], [place] [address] [place],  [address]          Radiology Studies: No results found.      Scheduled Meds:  amLODipine  10 mg Per Tube Daily   Chlorhexidine Gluconate Cloth  6 each Topical Daily   enoxaparin (LOVENOX) injection  40 mg Subcutaneous Q24H   feeding supplement (OSMOLITE 1.5 CAL)  1,000 mL Per Tube Q24H   mometasone-formoterol  2 puff Inhalation BID   morphine  60 mg Oral Q12H   multivitamin  15 mL Per Tube Daily   pantoprazole (PROTONIX) IV  40 mg Intravenous Q24H   potassium chloride  40 mEq Per Tube BID   scopolamine  1 patch Transdermal Q72H   sodium chloride flush  10-40 mL  Intracatheter Q12H   sodium chloride flush  10 mL Per Tube Q12H   sucralfate  1 g Per Tube QID   Continuous Infusions:  promethazine (PHENERGAN) injection (IM or IVPB) 6.25 mg (07/13/21 0120)     LOS: 2 days    Time spent: 30 minutes    Barb Merino, MD [facility] Pager [phone]

## 2021-07-17 DIAGNOSIS — R112 Nausea with vomiting, unspecified: Secondary | ICD-10-CM | POA: Diagnosis not present

## 2021-07-17 LAB — GLUCOSE, CAPILLARY
Glucose-Capillary: 101 mg/dL — ABNORMAL HIGH (ref 70–99)
Glucose-Capillary: 101 mg/dL — ABNORMAL HIGH (ref 70–99)
Glucose-Capillary: 113 mg/dL — ABNORMAL HIGH (ref 70–99)
Glucose-Capillary: 113 mg/dL — ABNORMAL HIGH (ref 70–99)
Glucose-Capillary: 114 mg/dL — ABNORMAL HIGH (ref 70–99)
Glucose-Capillary: 97 mg/dL (ref 70–99)

## 2021-07-17 LAB — BASIC METABOLIC PANEL
Anion gap: 1 — ABNORMAL LOW (ref 5–15)
BUN: <5 mg/dL — ABNORMAL LOW (ref 6–20)
CO2: 30 mmol/L (ref 22–32)
Calcium: 8.5 mg/dL — ABNORMAL LOW (ref 8.9–10.3)
Chloride: 109 mmol/L (ref 98–111)
Creatinine, Ser: 0.59 mg/dL (ref 0.44–1.00)
GFR, Estimated: >60 mL/min (ref >60)
Glucose, Bld: 108 mg/dL — ABNORMAL HIGH (ref 70–99)
Potassium: 4.1 mmol/L (ref 3.5–5.1)
Sodium: 140 mmol/L (ref 135–145)

## 2021-07-17 NOTE — Progress Notes (Signed)
Sepsis PROGRESS NOTE    Wendy Hoffman  [id number] DOB: [birth year] DOA: 07/12/2021 PCP: Patient, No Pcp Per (Inactive)    Brief Narrative:  55 year old female with history of pancreatic cancer, hypertension, protein calorie malnutrition with recent G-tube placement by IR on 7/20 and started on tube feedings and discharged home on 7/26 presented to the emergency room with recurrent nausea, nonbloody vomiting and diarrhea and increasing pain around the feeding tube.  In the emergency room hemodynamically stable.  Slightly tachycardic.  Hypokalemic.  CT scan abdomen pelvis with some stranding around the subcutaneous portion of the tube without fluid collection.  There was some evidence of colonic wall thickening suggestive of colitis.  Started on IV antibiotics, pain control with IV opiates and admitted to the hospital.  Tube feeding is  started after manipulation of G-tube by radiology.  Assessment & Plan:   Principal Problem:   Intractable nausea and vomiting Active Problems:   Asthma   Malignant neoplasm of head of pancreas (HCC)   Hypokalemia   UTI (urinary tract infection)   Colitis   Cellulitis  Intractable nausea and vomiting: Related to underlying pancreatic cancer and intolerance to tube feeding.  Patient on symptomatic treatment with around-the-clock nausea medications, pain medications. Symptomatically improving. Examining with tube feeding.  Can still take oral food and medications. Scopolamine patch helped with nausea.  G-tube malfunction: Seen by radiology.  Readjusted.  Started on tube feeding . We will make sure she can tolerate that before she go home. CT scan evidence with some colitis, less likely bacterial infection.  Discontinued all antibiotics to avoid unnecessary complications.  No more diarrhea.  Diet as tolerated.  Pancreatic cancer: Followed by Dr. Learta Codding.  They are planning to start patient on chemotherapy in the near future.  Chronic pain syndrome: As  above.  Hypokalemia: Replaced.  High risk of refeeding syndrome.  Recheck electrolytes tomorrow morning again.  GERD: On PPI.  Will change to p.o. on discharge.  Essential hypertension: On blood pressure medication with amlodipine.  Change to oral pain medications.  Mobilize.  Monitor for tube feeding tolerance.  Also looking for home health agencies to help her at home. Patient can only go home tomorrow once her niece moves into live with her.  DVT prophylaxis: enoxaparin (LOVENOX) injection 40 mg Start: 07/12/21 2245   Code Status: Full code Family Communication: None. Disposition Plan: Status is: Inpatient  Remains inpatient appropriate because:Inpatient level of care appropriate due to severity of illness  Dispo: The patient is from: Home              Anticipated d/c is to: Home with home health.              Patient currently is medically stable.  She does not have adequate support system at home today.   Difficult to place patient No         Consultants:  Oncology Interventional radiology  Procedures:  None  Antimicrobials:  Antibiotics Given (last 72 hours)     Date/Time Action Medication Dose Rate   07/14/21 1251 New Bag/Given   cefTRIAXone (ROCEPHIN) 2 g in sodium chloride 0.9 % 100 mL IVPB 2 g 200 mL/hr   07/14/21 1334 New Bag/Given   metroNIDAZOLE (FLAGYL) IVPB 500 mg 500 mg 100 mL/hr   07/15/21 0040 New Bag/Given   metroNIDAZOLE (FLAGYL) IVPB 500 mg 500 mg 100 mL/hr   07/15/21 [id number] New Bag/Given   metroNIDAZOLE (FLAGYL) IVPB 500 mg 500 mg 100 mL/hr   07/15/21  1053 New Bag/Given   cefTRIAXone (ROCEPHIN) 2 g in sodium chloride 0.9 % 100 mL IVPB 2 g 200 mL/hr          Subjective: Patient seen and examined.  Doing fairly well.  Tolerating tube feeding.  Patient tells me that her niece is going to move in with her tomorrow morning and she will be able to go home.  Does not have adequate support system today at home and does not feel safe to go home  today. Objective: Vitals:   07/17/21 0433 07/17/21 0443 07/17/21 1039 07/17/21 1039  BP: 112/74  105/72 105/72  Pulse: 82   85  Resp: 16     Temp: 98.6 F (37 C)     TempSrc: Oral     SpO2: 100%   99%  Weight:  98.2 kg    Height:        Intake/Output Summary (Last 24 hours) at 07/17/2021 1129 Last data filed at 07/17/2021 0600 Gross per 24 hour  Intake 618 ml  Output --  Net 618 ml   Filed Weights   07/13/21 0139 07/16/21 0418 07/17/21 0443  Weight: 89.9 kg 95.3 kg 98.2 kg    Examination:  General exam: Appears calm and comfortable.   Respiratory system: Clear to auscultation. Respiratory effort normal. Cardiovascular system: S1 & S2 heard, RRR.  Gastrointestinal system: Soft.  Minimal tenderness along the tube insertion site. No rigidity or guarding.  G-tube remains intact.  Tube feeding running.  G-tube on vent. Central nervous system: Alert and oriented. No focal neurological deficits. Extremities: Symmetric 5 x 5 power. Skin: No rashes, lesions or ulcers Psychiatry: Judgement and insight appear normal.      Data Reviewed: I have personally reviewed following labs and imaging studies  CBC: Recent Labs  Lab 07/12/21 1922 07/13/21 1000 07/14/21 0500 07/15/21 0349  WBC 8.5 4.9 3.6* 3.5*  NEUTROABS 7.2 3.1 2.2 1.9  HGB 11.3* 9.2* 8.5* 8.2*  HCT 34.9* 28.8* 26.7* 25.5*  MCV 92.1 93.5 95.0 94.4  PLT 394 293 269 [id number]   Basic Metabolic Panel: Recent Labs  Lab 07/13/21 1000 07/14/21 0500 07/15/21 0349 07/15/21 1227 07/15/21 2034 07/16/21 0350 07/16/21 1700 07/17/21 0315  NA 140 140 139  --   --  140  --  140  K 3.0* 2.9* 3.1*  --   --  3.2*  --  4.1  CL 108 107 108  --   --  105  --  109  CO2 26 20* 26  --   --  26  --  30  GLUCOSE 87 82 85  --   --  127*  --  108*  BUN 9 7 <5*  --   --  <5*  --  <5*  CREATININE 0.77 0.61 0.57  --   --  0.60  --  0.59  CALCIUM 8.5* 8.4* 8.3*  --   --  8.3*  --  8.5*  MG 1.8 1.9 1.7 2.3 1.9 1.8 1.5*  --   PHOS 3.7 3.7  3.2 3.0 2.7 2.5 1.8*  --    GFR: Estimated Creatinine Clearance: 92.6 mL/min (by C-G formula based on SCr of 0.59 mg/dL). Liver Function Tests: Recent Labs  Lab 07/12/21 1922 07/13/21 1000 07/14/21 0500 07/15/21 0349  AST '20 17 15 15  '$ ALT '15 11 11 11  '$ ALKPHOS 101 77 64 72  BILITOT 0.8 0.6 0.6 0.5  PROT 6.8 5.6* 5.1* 5.3*  ALBUMIN 3.3* 2.8* 2.5* 2.7*  Recent Labs  Lab 07/12/21 1922  LIPASE 24   No results for input(s): AMMONIA in the last 168 hours. Coagulation Profile: No results for input(s): INR, PROTIME in the last 168 hours. Cardiac Enzymes: No results for input(s): CKTOTAL, CKMB, CKMBINDEX, TROPONINI in the last 168 hours. BNP (last 3 results) No results for input(s): PROBNP in the last 8760 hours. HbA1C: No results for input(s): HGBA1C in the last 72 hours. CBG: Recent Labs  Lab 07/16/21 1525 07/16/21 2031 07/16/21 2340 07/17/21 0435 07/17/21 0734  GLUCAP 89 95 95 113* 101*   Lipid Profile: No results for input(s): CHOL, HDL, LDLCALC, TRIG, CHOLHDL, LDLDIRECT in the last 72 hours. Thyroid Function Tests: No results for input(s): TSH, T4TOTAL, FREET4, T3FREE, THYROIDAB in the last 72 hours. Anemia Panel: No results for input(s): VITAMINB12, FOLATE, FERRITIN, TIBC, IRON, RETICCTPCT in the last 72 hours.  Sepsis Labs: No results for input(s): PROCALCITON, LATICACIDVEN in the last 168 hours.  Recent Results (from the past 240 hour(s))  Urine Culture     Status: Abnormal   Collection Time: 07/12/21  7:22 PM   Specimen: Urine, Clean Catch  Result Value Ref Range Status   Specimen Description   Final    URINE, CLEAN CATCH Performed at [facility], [place] [address], [place], [place] [address]    Special Requests   Final    Immunocompromised Performed at [facility], [place] [address] [place], [place] [address]    Culture (A)  Final    <10,000 COLONIES/mL INSIGNIFICANT GROWTH Performed at [place] [address] [place], [place] [address]    Report Status 07/14/2021 FINAL  Final  Resp Panel by RT-PCR (Flu A&B, Covid) Nasopharyngeal Swab     Status: None   Collection Time: 07/12/21  9:55 PM   Specimen: Nasopharyngeal Swab; Nasopharyngeal(NP) swabs in vial transport medium  Result Value Ref Range Status   SARS Coronavirus 2 by RT PCR NEGATIVE NEGATIVE Final    Comment: (NOTE) SARS-CoV-2 target nucleic acids are NOT DETECTED.  The SARS-CoV-2 RNA is generally detectable in upper respiratory specimens during the acute phase of infection. The lowest concentration of SARS-CoV-2 viral copies this assay can detect is 138 copies/mL. A negative result does not preclude SARS-Cov-2 infection and should not be used as the sole basis for treatment or other patient management decisions. A negative result may occur with  improper specimen collection/handling, submission of specimen other than nasopharyngeal swab, presence of viral mutation(s) within the areas targeted by this assay, and inadequate number of viral copies(<138 copies/mL). A negative result must be combined with clinical observations, patient history, and epidemiological information. The expected result is Negative.  Fact Sheet for Patients:  EntrepreneurPulse.com.au  Fact Sheet for Healthcare Providers:  IncredibleEmployment.be  This test is no t yet approved or cleared by the [place] FDA and  has been authorized for detection and/or diagnosis of SARS-CoV-2 by FDA under an Emergency Use Authorization (EUA). This EUA will remain  in effect (meaning this test can be used) for the duration of the COVID-19 declaration under Section 564(b)(1) of the Act, 21 U.S.C.section 360bbb-3(b)(1), unless the authorization is terminated  or revoked sooner.       Influenza A by PCR NEGATIVE NEGATIVE Final   Influenza B by PCR NEGATIVE NEGATIVE Final    Comment: (NOTE) The Xpert Xpress  SARS-CoV-2/FLU/RSV plus assay is intended as an aid in the diagnosis of influenza from Nasopharyngeal swab specimens and should not be  used as a sole basis for treatment. Nasal washings and aspirates are unacceptable for Xpert Xpress SARS-CoV-2/FLU/RSV testing.  Fact Sheet for Patients: EntrepreneurPulse.com.au  Fact Sheet for Healthcare Providers: IncredibleEmployment.be  This test is not yet approved or cleared by the [place] FDA and has been authorized for detection and/or diagnosis of SARS-CoV-2 by FDA under an Emergency Use Authorization (EUA). This EUA will remain in effect (meaning this test can be used) for the duration of the COVID-19 declaration under Section 564(b)(1) of the Act, 21 U.S.C. section 360bbb-3(b)(1), unless the authorization is terminated or revoked.  Performed at [facility], [place] [address] [place], [place] [address]          Radiology Studies: No results found.      Scheduled Meds:  amLODipine  10 mg Per Tube Daily   Chlorhexidine Gluconate Cloth  6 each Topical Daily   enoxaparin (LOVENOX) injection  40 mg Subcutaneous Q24H   feeding supplement (OSMOLITE 1.5 CAL)  1,000 mL Per Tube Q24H   mometasone-formoterol  2 puff Inhalation BID   morphine  60 mg Oral Q12H   multivitamin  15 mL Per Tube Daily   pantoprazole (PROTONIX) IV  40 mg Intravenous Q24H   potassium chloride  40 mEq Per Tube BID   scopolamine  1 patch Transdermal Q72H   sodium chloride flush  10-40 mL Intracatheter Q12H   sodium chloride flush  10 mL Per Tube Q12H   sucralfate  1 g Per Tube QID   Continuous Infusions:  promethazine (PHENERGAN) injection (IM or IVPB) 6.25 mg (07/13/21 0120)     LOS: 3 days    Time spent: 30 minutes    Barb Merino, MD [facility] Pager [phone]

## 2021-07-18 DIAGNOSIS — R112 Nausea with vomiting, unspecified: Secondary | ICD-10-CM | POA: Diagnosis not present

## 2021-07-18 LAB — BASIC METABOLIC PANEL
Anion gap: 8 (ref 5–15)
BUN: 8 mg/dL (ref 6–20)
CO2: 26 mmol/L (ref 22–32)
Calcium: 9.1 mg/dL (ref 8.9–10.3)
Chloride: 108 mmol/L (ref 98–111)
Creatinine, Ser: 0.52 mg/dL (ref 0.44–1.00)
GFR, Estimated: >60 mL/min (ref >60)
Glucose, Bld: 125 mg/dL — ABNORMAL HIGH (ref 70–99)
Potassium: 4.5 mmol/L (ref 3.5–5.1)
Sodium: 142 mmol/L (ref 135–145)

## 2021-07-18 LAB — PHOSPHORUS: Phosphorus: 2.4 mg/dL — ABNORMAL LOW (ref 2.5–4.6)

## 2021-07-18 LAB — GLUCOSE, CAPILLARY
Glucose-Capillary: 116 mg/dL — ABNORMAL HIGH (ref 70–99)
Glucose-Capillary: 148 mg/dL — ABNORMAL HIGH (ref 70–99)
Glucose-Capillary: 123 mg/dL — ABNORMAL HIGH (ref 70–99)
Glucose-Capillary: 102 mg/dL — ABNORMAL HIGH (ref 70–99)
Glucose-Capillary: 85 mg/dL (ref 70–99)

## 2021-07-18 LAB — MAGNESIUM: Magnesium: 1.8 mg/dL (ref 1.7–2.4)

## 2021-07-18 MED ORDER — SCOPOLAMINE 1 MG/3DAYS TD PT72: 1.0000 | MEDICATED_PATCH | TRANSDERMAL | 12 refills | Status: DC

## 2021-07-18 MED ORDER — MORPHINE SULFATE (CONCENTRATE) 10 MG/0.5ML PO SOLN: 10.0000 mg | ORAL | 0 refills | Status: DC | PRN

## 2021-07-18 MED ORDER — MORPHINE SULFATE ER 60 MG PO TBCR: 60.0000 mg | EXTENDED_RELEASE_TABLET | Freq: Two times a day (BID) | ORAL | 0 refills | Status: DC

## 2021-07-18 MED ORDER — HEPARIN SOD (PORK) LOCK FLUSH 100 UNIT/ML IV SOLN
500.0000 [IU] | INTRAVENOUS | Status: AC | PRN
  Administered 2021-07-18: 500 [IU]
  Filled 2021-07-18: qty 5

## 2021-07-18 NOTE — Progress Notes (Signed)
Pt discharged home, transported by niece. All belonging and discharge directions given to patient.

## 2021-07-18 NOTE — Discharge Planning (Signed)
Reviewed d/c instructions, teach back of stoma care, provided extra supplies and administered SL morphine.

## 2021-07-18 NOTE — Progress Notes (Signed)
Pts niece is picking her up by 2000.

## 2021-07-18 NOTE — Discharge Summary (Signed)
Physician Discharge Summary  Wendy Hoffman [id number] DOB: [birth year] DOA: 07/12/2021  PCP: Patient, No Pcp Per (Inactive)  Admit date: 07/12/2021 Discharge date: 07/18/2021  Admitted From: Home Disposition: Home with home health  Recommendations for Outpatient Follow-up:  Follow-up with oncology office as scheduled.  Home Health: Home health RN Equipment/Devices: Feeding supplies  Discharge Condition: Fair CODE STATUS: Full code Diet recommendation: Clear liquid diet by mouth.  Can take medications by mouth.  Nausea medications. Tube feeding 16 hours a day.  Discharge summary: 55 year old female with history of pancreatic cancer, hypertension, protein calorie malnutrition with recent G-tube placement by IR on 7/20 and started on tube feedings and discharged home on 7/26 presented to the emergency room with recurrent nausea, nonbloody vomiting and diarrhea and increasing pain around the feeding tube.  In the emergency room hemodynamically stable.  Slightly tachycardic.  Hypokalemic.  CT scan abdomen pelvis with some stranding around the subcutaneous portion of the tube without fluid collection.  There was some evidence of colonic wall thickening suggestive of colitis.  Started on IV antibiotics, pain control with IV opiates and admitted to the hospital.  Tube feeding started after manipulation of G-tube by radiology and she is tolerating that now.   Intractable nausea and vomiting:  Related to underlying pancreatic cancer and intolerance to tube feeding.   Patient on symptomatic treatment with around-the-clock nausea medications, pain medications. Symptomatically improving. Can still take oral food and medications. Scopolamine patch helped with nausea.  Will prescribe.   G-tube malfunction:  Seen by radiology.  Readjusted.  Started on tube feeding .  Tolerating tube feeding. CT scan evidence with some colitis, less likely bacterial infection.  Patient will keep G-tube on vent.  She  will Clamp it  after taking oral medications for about 1 hour that we will ensure absorption of medications.  Pancreatic cancer: Followed by Dr. Learta Codding.  They are planning to start patient on chemotherapy in the near future.  Chronic pain syndrome: Managed by cancer center.  Pain regimen was changed in the hospital by oncologist. MS Contin increased from 30 mg twice a day to 60 mg twice a day. Oxycodone changed to morphine concentrated liquid.  Since cancer center is closed today, I have prescribed her 5 days of controlled substances until she has follow-up at cancer center.  Electrolytes were replaced.  She is on Protonix and Carafate that she will continue.  Blood pressures are stable on amlodipine.  Patient is medically stable today.  Very high risk of readmission given her nature of disease that includes metastatic pancreatic cancer, dietary intolerance, nausea.    Discharge Diagnoses:  Principal Problem:   Intractable nausea and vomiting Active Problems:   Asthma   Malignant neoplasm of head of pancreas (HCC)   Hypokalemia   UTI (urinary tract infection)   Colitis   Cellulitis    Discharge Instructions  Discharge Instructions     Call MD for:  persistant nausea and vomiting   Complete by: As directed    Call MD for:  severe uncontrolled pain   Complete by: As directed    Diet - low sodium heart healthy   Complete by: As directed    Stay on small volume clear liquid diet   Increase activity slowly   Complete by: As directed       Allergies as of 07/18/2021       Reactions   Blueberry [vaccinium Angustifolium] Anaphylaxis   Mango Flavor Anaphylaxis   Oxaliplatin Itching, Other (See Comments)  Patient has reacted to oxaliplatin with minor symptoms of neuropathy and itching.   Reglan [metoclopramide] Nausea And Vomiting   Aspirin Rash   Penicillins Hives   Has patient had a PCN reaction causing immediate rash, facial/tongue/throat swelling, SOB or lightheadedness  with hypotension: Yes Has patient had a PCN reaction causing severe rash involving mucus membranes or skin necrosis: No Has patient had a PCN reaction that required hospitalization No Has patient had a PCN reaction occurring within the last 10 years: No If all of the above answers are "NO", then may proceed with Cephalosporin use.        Medication List     STOP taking these medications    oxyCODONE 5 MG/5ML solution Commonly known as: ROXICODONE   polyethylene glycol 17 g packet Commonly known as: MIRALAX / GLYCOLAX       TAKE these medications    albuterol 108 (90 Base) MCG/ACT inhaler Commonly known as: VENTOLIN HFA INHALE 1 TO 2 PUFFS BY MOUTH EVERY 6 HOURS AS NEEDED FOR WHEEZING AND FOR SHORTNESS OF BREATH What changed:  how much to take how to take this when to take this reasons to take this   alum & mag hydroxide-simeth 200-200-20 MG/5ML suspension Commonly known as: MAALOX/MYLANTA Take 30 mLs by mouth every 6 (six) hours as needed for indigestion or heartburn.   amLODipine 10 MG tablet Commonly known as: NORVASC Take 1 tablet (10 mg total) by mouth daily.   bisacodyl 5 MG EC tablet Commonly known as: DULCOLAX Take 2 tablets (10 mg total) by mouth daily. What changed:  when to take this reasons to take this   budesonide-formoterol 160-4.5 MCG/ACT inhaler Commonly known as: Symbicort Inhale 2 puffs into the lungs 2 (two) times daily.   diphenhydramine-acetaminophen 25-500 MG Tabs tablet Commonly known as: TYLENOL PM Take 1 tablet by mouth at bedtime as needed (sleep).   feeding supplement (OSMOLITE 1.5 CAL) Liqd Place 1,000 mLs into feeding tube daily.   gabapentin 300 MG capsule Commonly known as: NEURONTIN TAKE 1 CAPSULE AS NEEDED BY ORAL ROUTE AT BEDTIME. What changed:  how much to take how to take this when to take this reasons to take this   lidocaine-prilocaine cream Commonly known as: EMLA Apply 1 application topically as  needed. What changed: reasons to take this   magnesium oxide 400 (240 Mg) MG tablet Commonly known as: MAG-OX Take 2 tablets (800 mg total) by mouth daily.   morphine 60 MG 12 hr tablet Commonly known as: MS CONTIN Take 1 tablet (60 mg total) by mouth every 12 (twelve) hours for 5 days. What changed: Another medication with the same name was added. Make sure you understand how and when to take each.   morphine CONCENTRATE 10 MG/0.5ML Soln concentrated solution Place 0.5-1 mLs (10-20 mg total) under the tongue every 4 (four) hours as needed for up to 5 days for severe pain. What changed: You were already taking a medication with the same name, and this prescription was added. Make sure you understand how and when to take each.   ondansetron 4 MG tablet Commonly known as: ZOFRAN Take 1 tablet (4 mg total) by mouth every 6 (six) hours as needed for nausea.   pantoprazole 40 MG tablet Commonly known as: PROTONIX Take 1 tablet (40 mg total) by mouth 2 (two) times daily.   potassium chloride 10 MEQ CR capsule Commonly known as: MICRO-K Take 2 capsules (20 mEq total) by mouth 2 (two) times daily.  prochlorperazine 10 MG tablet Commonly known as: COMPAZINE TAKE 1 TABLET(10 MG) BY MOUTH EVERY 6 HOURS AS NEEDED FOR NAUSEA OR VOMITING What changed: See the new instructions.   scopolamine 1 MG/3DAYS Commonly known as: TRANSDERM-SCOP Place 1 patch (1.5 mg total) onto the skin every 3 (three) days. Start taking on: July 20, 2021   senna-docusate 8.6-50 MG tablet Commonly known as: Senokot-S Take 2 tablets by mouth 2 (two) times daily. What changed:  when to take this reasons to take this   sodium chloride flush 0.9 % Soln injection Place 10 mLs into feeding tube every 12 (twelve) hours.   sucralfate 1 GM/10ML suspension Commonly known as: Carafate Take 10 mLs (1 g total) by mouth 4 (four) times daily.   zolpidem 10 MG tablet Commonly known as: AMBIEN Take 1 tablet (10 mg  total) by mouth at bedtime as needed for sleep. What changed: when to take this        Allergies  Allergen Reactions   Blueberry [Vaccinium Angustifolium] Anaphylaxis   Mango Flavor Anaphylaxis   Oxaliplatin Itching and Other (See Comments)    Patient has reacted to oxaliplatin with minor symptoms of neuropathy and itching.   Reglan [Metoclopramide] Nausea And Vomiting   Aspirin Rash   Penicillins Hives    Has patient had a PCN reaction causing immediate rash, facial/tongue/throat swelling, SOB or lightheadedness with hypotension: Yes Has patient had a PCN reaction causing severe rash involving mucus membranes or skin necrosis: No Has patient had a PCN reaction that required hospitalization No Has patient had a PCN reaction occurring within the last 10 years: No If all of the above answers are "NO", then may proceed with Cephalosporin use.     Consultations: Interventional radiology Oncology   Procedures/Studies: DG THORACOLUMABAR SPINE  Result Date: 06/28/2021 CLINICAL DATA:  Low back pain for 1 day, history of metastatic pancreatic cancer EXAM: THORACOLUMBAR SPINE 1V COMPARISON:  06/26/2021 FINDINGS: Frontal and lateral views of the thoracolumbar spine are obtained. There are no acute or destructive bony lesions. Mild spondylosis is seen within the mid to lower thoracic spine. Severe facet hypertrophic changes are seen within the mid to lower lumbar spine. Sacroiliac joints are normal. Visualized bowel gas pattern is unremarkable. IMPRESSION: 1. Mild mid to lower thoracic spondylosis, with severe lower lumbar facet hypertrophy. 2. No acute or destructive bony lesions. Electronically Signed   By: Randa Ngo M.D.   On: 06/28/2021 15:22   MR BRAIN W WO CONTRAST  Result Date: 07/05/2021 CLINICAL DATA:  Staging of gastrointestinal small cancer. History of pancreatic carcinoma. EXAM: MRI HEAD WITHOUT AND WITH CONTRAST TECHNIQUE: Multiplanar, multiecho pulse sequences of the brain  and surrounding structures were obtained without and with intravenous contrast. CONTRAST:  50m GADAVIST GADOBUTROL 1 MMOL/ML IV SOLN COMPARISON:  None. FINDINGS: Brain: No acute infarct, mass effect or extra-axial collection. No acute or chronic hemorrhage. There is multifocal hyperintense T2-weighted signal within the white matter. Parenchymal volume and CSF spaces are normal. A partially empty sella is incidentally noted. There is no abnormal contrast enhancement. Vascular: Major flow voids are preserved. Skull and upper cervical spine: Normal calvarium and skull base. Visualized upper cervical spine and soft tissues are normal. Sinuses/Orbits:No paranasal sinus fluid levels or advanced mucosal thickening. No mastoid or middle ear effusion. Normal orbits. IMPRESSION: 1. No intracranial metastatic disease. 2. Multifocal hyperintense T2-weighted signal within the white matter, likely due to arteriosclerosis. Electronically Signed   By: KUlyses JarredM.D.   On: 07/05/2021 19:37  CT ABDOMEN PELVIS W CONTRAST  Result Date: 07/12/2021 CLINICAL DATA:  Abdominal pain and distension. Recent G-tube. History of pancreatic cancer. EXAM: CT ABDOMEN AND PELVIS WITH CONTRAST TECHNIQUE: Multidetector CT imaging of the abdomen and pelvis was performed using the standard protocol following bolus administration of intravenous contrast. CONTRAST:  30m OMNIPAQUE IOHEXOL 350 MG/ML SOLN COMPARISON:  Most recent CT 06/26/2021 FINDINGS: Lower chest: Central line tip in the right ventricle. Heart is normal in size. No basilar nodule, focal airspace disease or pleural effusion. Hepatobiliary: Multiple hepatic masses are again seen, less well-defined on the current exam due to phase of contrast. Index lesion in the left lobe measures 7.5 x 5.8 cm, series 2, image 14, previously 8.0 x 6.1 cm. Index lesion in the right lobe measures 7.8 x 4.1 cm, series 2, image 12, previously 7.9 x 4.5 cm. There additional smaller lesions in the right  and left hepatic lobe. The gallbladder is surgically absent. There is no intra or extrahepatic biliary ductal dilatation. Pancreas: Ill-defined hypoenhancing pancreatic head mass measures approximately 2.3 x 3.1 cm, series 2, image 38, previously 2.8 x 3.1 cm. Slight pancreatic ductal prominence and parenchymal atrophy are stable. No acute peripancreatic inflammation Spleen: Normal in size without focal abnormality. Splenic vein is patent. Adrenals/Urinary Tract: No adrenal nodule. No hydronephrosis or renal calculi. No focal renal lesion. Unremarkable urinary bladder for degree of distension. Stomach/Bowel: New gastrostomy tube is in place. The balloon is not opposed to the gastric or abdominal wall. There is mild edema and fat stranding along the subcutaneous portion of the gastrostomy tube but no focal fluid collection. Tip of the gastrostomy tube is in the proximal jejunum. The stomach is decompressed. Fluid/liquid stool in the colon with suggestion of mild colonic wall thickening at the hepatic flexure and proximal transverse, series 2, image 49. There is also wall thickening and mesenteric edema involving pelvic bowel small bowel loops. No pneumatosis, obstruction, or abnormal distention. Normal appendix. Vascular/Lymphatic: Mild aortic atherosclerosis without aneurysm. Patent portal and splenic veins. No mesenteric or portal venous gas. No definite peripancreatic adenopathy. No enlarged lymph nodes in the abdomen or pelvis. Reproductive: Hysterectomy.  Quiescent ovaries.  No adnexal mass. Other: Small volume of free fluid in the pelvis and right greater than left pericolic gutter, likely reactive. No definite omental thickening. No free air. Musculoskeletal: Sclerotic focus within posterior L3 vertebra is unchanged from prior. No acute osseous abnormalities are seen. IMPRESSION: 1. New gastrostomy tube in place. The balloon is within the gastric lumen, not opposed to the gastric or abdominal wall. There is  mild edema and fat stranding along the subcutaneous portion of the gastrostomy tube but no focal fluid collection. 2. Fluid/liquid stool in the colon with suggestion of mild colonic wall thickening at the hepatic flexure and proximal transverse. There is also wall thickening and mesenteric edema involving pelvic bowel loops. Findings suspicious for enteritis/colitis. 3. Multiple hepatic masses, slightly decreased in size from prior exam. 4. Pancreatic head mass is unchanged in size from prior exam. 5. Small sclerotic focus within L3 vertebra is nonspecific in the setting. This is unchanged from prior exam. Aortic Atherosclerosis (ICD10-I70.0). Electronically Signed   By: MKeith RakeM.D.   On: 07/12/2021 21:48   CT ABDOMEN PELVIS W CONTRAST  Result Date: 06/26/2021 CLINICAL DATA:  Worsening nausea and vomiting over the last week, pancreatic cancer undergoing chemotherapy EXAM: CT ABDOMEN AND PELVIS WITH CONTRAST TECHNIQUE: Multidetector CT imaging of the abdomen and pelvis was performed using the standard protocol  following bolus administration of intravenous contrast. CONTRAST:  23m OMNIPAQUE IOHEXOL 350 MG/ML SOLN COMPARISON:  06/17/2021, 05/23/2021, 02/16/2021 FINDINGS: Lower chest: No acute pleural or parenchymal lung disease. Central venous catheter tip within the right atrium near the junction with the IVC. No pericardial effusion. Hepatobiliary: Multiple hepatic masses are again identified. Largest lesion within the left lobe liver reference image 27/2 measures 8.0 x 6.1 cm, previously measuring 7.6 x 6.0 cm on MRI 05/23/2021. Largest lesion within the right lobe liver measures up to 8.2 x 4.5 cm on image 23/2, previously measuring 7.6 x 3.7 cm on MRI. Smaller lesions throughout the left lobe liver more inferiorly are grossly unchanged, though less well visualized on CT compared to MRI. No new lesions are definitively identified. No intrahepatic biliary duct dilation. Gallbladder is surgically  absent. Pancreas: The ill-defined mass within the pancreatic head measures up to approximately 2.8 x 3.1 cm reference image 65/2, previously having measured 2.9 x 2.9 cm on MRI 05/23/2021. Mild pancreatic duct dilation and upstream pancreatic parenchymal atrophy again noted unchanged. Spleen: No focal splenic abnormalities. Mild splenomegaly unchanged. Adrenals/Urinary Tract: No urinary tract calculi or obstructive uropathy within either kidney. The kidneys enhance normally and symmetrically. Small left renal cortical cysts unchanged. The adrenals are unremarkable. Bladder is only minimally distended with no gross abnormalities. Stomach/Bowel: No bowel obstruction or ileus. Normal retrocecal appendix. No bowel wall thickening or inflammatory change. Vascular/Lymphatic: Mild atherosclerosis of the aorta and iliac vessels unchanged. No discrete adenopathy within the abdomen or pelvis. Reproductive: Status post hysterectomy. No adnexal masses. Other: Trace pelvic free fluid. No free intraperitoneal gas. No abdominal wall hernia. Musculoskeletal: No acute or destructive bony lesions. Reconstructed images demonstrate no additional findings. IMPRESSION: 1. Grossly stable mass within the pancreatic head compatible with known pancreatic cancer. 2. No significant change in the size and number of known liver metastases, allowing for differences in technique compared to previous MRI. 3. Trace pelvic free fluid. 4.  Aortic Atherosclerosis (ICD10-I70.0). Electronically Signed   By: MRanda NgoM.D.   On: 06/26/2021 17:01   IR GASTROSTOMY TUBE MOD SED  Result Date: 07/01/2021 INDICATION: 55year old woman with history metastatic pancreatic cancer presents to interventional radiology [place] for treatment of nausea and vomiting likely related to compression of the stomach from hepatic metastases. EXAM: Fluoroscopy guided GJ tube placement MEDICATIONS: Vancomycin 1 gm IV; Antibiotics were administered within 1  hour of the procedure. Glucagon 1 mg IV ANESTHESIA/SEDATION: Versed 3 mg IV; Fentanyl 150 mcg IV Moderate Sedation Time:  63 minutes The patient was continuously monitored during the procedure by the interventional radiology nurse under my direct supervision. CONTRAST:  10 mL of Omnipaque 300-administered into the gastric lumen. FLUOROSCOPY TIME:  Fluoroscopy Time: 8 minutes 12 seconds (229 mGy). COMPLICATIONS: None immediate. PROCEDURE: Informed written consent was obtained from the patient after a thorough discussion of the procedural risks, benefits and alternatives. All questions were addressed. Maximal Sterile Barrier Technique was utilized including caps, mask, sterile gowns, sterile gloves, sterile drape, hand hygiene and skin antiseptic. A timeout was performed prior to the initiation of the procedure. The inferior margin of the liver was marked utilizing ultrasound guidance. An orogastric tube was placed with fluoroscopic guidance. The anterior abdomen was prepped and draped in sterile fashion. Ultrasound evaluation of the left upper quadrant was performed to confirm the position of the liver. The skin and subcutaneous tissues were anesthetized with 1% lidocaine. Single gastropexy was placed utilizing fluoroscopic guidance. 19 gauge needle was directed into  the distended stomach with fluoroscopic guidance. A wire was advanced into the stomach. 9-French vascular sheath was placed and the orogastric tube was snared using a Gooseneck snare device. Guidewire was advanced through the orogastric tube and snared. The snare and wire were pulled out of the patient's mouth. The snare device was connected to a 20-French gastrostomy tube. The snare device and gastrostomy tube were pulled through the patient's mouth and out the anterior abdominal wall. The gastrostomy tube was cut to an appropriate length. Contrast injection through gastrostomy tube confirmed placement within the stomach. Kumpe catheter and Glidewire  advanced through the gastrostomy tube to the proximal jejunum. 9 French jejunostomy tube was inserted through the G-tube with tip positioned in the proximal jejunum. Position of the J limb was conformed by administering contrast under fluoroscopy. The gastrojejunostomy tube was flushed with normal saline. IMPRESSION: Gastrojejunostomy tube placed utilizing fluoroscopic guidance. The gastrostomy tube is 17 [place] while the jejunostomy tube is 51 [place]. Electronically Signed   By: Miachel Roux M.D.   On: 07/01/2021 16:41   VAS [place] LOWER EXTREMITY VENOUS (DVT)  Result Date: 06/29/2021  Lower Venous DVT Study Patient Name:  TAREVA LEVEN  Date of Exam:   06/29/2021 Medical Rec #: [id number]        Accession #:    [id number] Date of Birth: 09-21-1966        Patient Gender: F Patient Age:   [id number] Exam Location:  [facility] Procedure:      VAS [place] LOWER EXTREMITY VENOUS (DVT) Referring Phys: Bunker --------------------------------------------------------------------------------  Indications: RLE swelling.  Risk Factors: Chemotherapy Cancer Pancreatic w/ mets. Limitations: Poor ultrasound/tissue interface. Comparison Study: No previous exams Performing Technologist: Jody Hill RVT, RDMS  Examination Guidelines: A complete evaluation includes B-mode imaging, spectral Doppler, color Doppler, and power Doppler as needed of all accessible portions of each vessel. Bilateral testing is considered an integral part of a complete examination. Limited examinations for reoccurring indications may be performed as noted. The reflux portion of the exam is performed with the patient in reverse Trendelenburg.  +---------+---------------+---------+-----------+----------+--------------+ RIGHT    CompressibilityPhasicitySpontaneityPropertiesThrombus Aging +---------+---------------+---------+-----------+----------+--------------+ CFV      Full           Yes      Yes                                  +---------+---------------+---------+-----------+----------+--------------+ SFJ      Full                                                        +---------+---------------+---------+-----------+----------+--------------+ FV Prox  Full           Yes      Yes                                 +---------+---------------+---------+-----------+----------+--------------+ FV Mid   Full           Yes      Yes                                 +---------+---------------+---------+-----------+----------+--------------+ FV DistalFull  Yes      Yes                                 +---------+---------------+---------+-----------+----------+--------------+ PFV      Full                                                        +---------+---------------+---------+-----------+----------+--------------+ POP      Full           Yes      Yes                                 +---------+---------------+---------+-----------+----------+--------------+ PTV      Full                                                        +---------+---------------+---------+-----------+----------+--------------+ PERO     Full                                                        +---------+---------------+---------+-----------+----------+--------------+   +---------+---------------+---------+-----------+----------+--------------+ LEFT     CompressibilityPhasicitySpontaneityPropertiesThrombus Aging +---------+---------------+---------+-----------+----------+--------------+ CFV      Full           Yes      Yes                                 +---------+---------------+---------+-----------+----------+--------------+ SFJ      Full                                                        +---------+---------------+---------+-----------+----------+--------------+ FV Prox  Full           Yes      Yes                                  +---------+---------------+---------+-----------+----------+--------------+ FV Mid   Full           Yes      Yes                                 +---------+---------------+---------+-----------+----------+--------------+ FV DistalFull           Yes      Yes                                 +---------+---------------+---------+-----------+----------+--------------+ PFV      Full                                                        +---------+---------------+---------+-----------+----------+--------------+  POP      Full           Yes      Yes                                 +---------+---------------+---------+-----------+----------+--------------+ PTV      Full                                                        +---------+---------------+---------+-----------+----------+--------------+ PERO     Full                                                        +---------+---------------+---------+-----------+----------+--------------+     Summary: BILATERAL: - No evidence of deep vein thrombosis seen in the lower extremities, bilaterally. - No evidence of superficial venous thrombosis in the lower extremities, bilaterally. -No evidence of popliteal cyst, bilaterally. RIGHT: - Ultrasound characteristics of enlarged lymph nodes are noted in the groin.  LEFT: - Ultrasound characteristics of enlarged lymph nodes noted in the groin.  *See table(s) above for measurements and observations. Electronically signed by Ruta Hinds MD on 06/29/2021 at 7:52:11 PM.    Final    (Echo, Carotid, EGD, Colonoscopy, ERCP)    Subjective: Patient seen and examined.  Early morning she was having some pain around the [place] tube.  She wanted to see how she does with the pain medications.  Tolerating tube feeding.  Currently on 1000 mL tube feeding over 16 hours.  Her niece will be available today to help her.   Discharge Exam: Vitals:   07/17/21 2028 07/18/21 0511  BP: 134/86 113/69  Pulse: 89  98  Resp: 16 16  Temp: 98.5 F (36.9 C) 98.8 F (37.1 C)  SpO2: 99% 100%   Vitals:   07/17/21 1228 07/17/21 2028 07/18/21 0500 07/18/21 0511  BP: 116/77 134/86  113/69  Pulse: 77 89  98  Resp: '12 16  16  '$ Temp: 98.6 F (37 C) 98.5 F (36.9 C)  98.8 F (37.1 C)  TempSrc: Oral Oral  Oral  SpO2: 100% 99%  100%  Weight:   97.2 kg   Height:        General: Pt is alert, awake, not in acute distress Not in any distress.  Slightly anxious today. Cardiovascular: RRR, S1/S2 +, no rubs, no gallops Respiratory: CTA bilaterally, no wheezing, no rhonchi Abdominal: Soft, obese and pendulous.  Mildly tender in the epigastrium around the [place] tube. Tube feeding is running. G-tube on the vent. Extremities: no edema, no cyanosis    The results of significant diagnostics from this hospitalization (including imaging, microbiology, ancillary and laboratory) are listed below for reference.     Microbiology: Recent Results (from the past 240 hour(s))  Urine Culture     Status: Abnormal   Collection Time: 07/12/21  7:22 PM   Specimen: Urine, Clean Catch  Result Value Ref Range Status   Specimen Description   Final    URINE, CLEAN CATCH Performed at [facility], [place] [address], [place], [place] [address]    Special Requests  Final    Immunocompromised Performed at [facility], [place] [address] [place], [place] [address]    Culture (A)  Final    <10,000 COLONIES/mL INSIGNIFICANT GROWTH Performed at [facility] [address], [place], [place] [address]    Report Status 07/14/2021 FINAL  Final  Resp Panel by RT-PCR (Flu A&B, Covid) Nasopharyngeal Swab     Status: None   Collection Time: 07/12/21  9:55 PM   Specimen: Nasopharyngeal Swab; Nasopharyngeal(NP) swabs in vial transport medium  Result Value Ref Range Status   SARS Coronavirus 2 by RT PCR NEGATIVE NEGATIVE Final    Comment: (NOTE) SARS-CoV-2 target nucleic acids are NOT  DETECTED.  The SARS-CoV-2 RNA is generally detectable in upper respiratory specimens during the acute phase of infection. The lowest concentration of SARS-CoV-2 viral copies this assay can detect is 138 copies/mL. A negative result does not preclude SARS-Cov-2 infection and should not be used as the sole basis for treatment or other patient management decisions. A negative result may occur with  improper specimen collection/handling, submission of specimen other than nasopharyngeal swab, presence of viral mutation(s) within the areas targeted by this assay, and inadequate number of viral copies(<138 copies/mL). A negative result must be combined with clinical observations, patient history, and epidemiological information. The expected result is Negative.  Fact Sheet for Patients:  EntrepreneurPulse.com.au  Fact Sheet for Healthcare Providers:  IncredibleEmployment.be  This test is no t yet approved or cleared by the [place] FDA and  has been authorized for detection and/or diagnosis of SARS-CoV-2 by FDA under an Emergency Use Authorization (EUA). This EUA will remain  in effect (meaning this test can be used) for the duration of the COVID-19 declaration under Section 564(b)(1) of the Act, 21 U.S.C.section 360bbb-3(b)(1), unless the authorization is terminated  or revoked sooner.       Influenza A by PCR NEGATIVE NEGATIVE Final   Influenza B by PCR NEGATIVE NEGATIVE Final    Comment: (NOTE) The Xpert Xpress SARS-CoV-2/FLU/RSV plus assay is intended as an aid in the diagnosis of influenza from Nasopharyngeal swab specimens and should not be used as a sole basis for treatment. Nasal washings and aspirates are unacceptable for Xpert Xpress SARS-CoV-2/FLU/RSV testing.  Fact Sheet for Patients: EntrepreneurPulse.com.au  Fact Sheet for Healthcare Providers: IncredibleEmployment.be  This test is not yet  approved or cleared by the [place] FDA and has been authorized for detection and/or diagnosis of SARS-CoV-2 by FDA under an Emergency Use Authorization (EUA). This EUA will remain in effect (meaning this test can be used) for the duration of the COVID-19 declaration under Section 564(b)(1) of the Act, 21 U.S.C. section 360bbb-3(b)(1), unless the authorization is terminated or revoked.  Performed at [facility], [place] [address] [place], [place] [address]      Labs: BNP (last 3 results) No results for input(s): BNP in the last 8760 hours. Basic Metabolic Panel: Recent Labs  Lab 07/14/21 0500 07/15/21 0349 07/15/21 1227 07/15/21 2034 07/16/21 0350 07/16/21 1700 07/17/21 0315 07/18/21 0330  NA 140 139  --   --  140  --  140 142  K 2.9* 3.1*  --   --  3.2*  --  4.1 4.5  CL 107 108  --   --  105  --  109 108  CO2 20* 26  --   --  26  --  30 26  GLUCOSE 82 85  --   --  127*  --  108* 125*  BUN 7 <5*  --   --  <5*  --  <5* 8  CREATININE 0.61 0.57  --   --  0.60  --  0.59 0.52  CALCIUM 8.4* 8.3*  --   --  8.3*  --  8.5* 9.1  MG 1.9 1.7 2.3 1.9 1.8 1.5*  --  1.8  PHOS 3.7 3.2 3.0 2.7 2.5 1.8*  --  2.4*   Liver Function Tests: Recent Labs  Lab 07/12/21 1922 07/13/21 1000 07/14/21 0500 07/15/21 0349  AST '20 17 15 15  '$ ALT '15 11 11 11  '$ ALKPHOS 101 77 64 72  BILITOT 0.8 0.6 0.6 0.5  PROT 6.8 5.6* 5.1* 5.3*  ALBUMIN 3.3* 2.8* 2.5* 2.7*   Recent Labs  Lab 07/12/21 1922  LIPASE 24   No results for input(s): AMMONIA in the last 168 hours. CBC: Recent Labs  Lab 07/12/21 1922 07/13/21 1000 07/14/21 0500 07/15/21 0349  WBC 8.5 4.9 3.6* 3.5*  NEUTROABS 7.2 3.1 2.2 1.9  HGB 11.3* 9.2* 8.5* 8.2*  HCT 34.9* 28.8* 26.7* 25.5*  MCV 92.1 93.5 95.0 94.4  PLT 394 293 269 250   Cardiac Enzymes: No results for input(s): CKTOTAL, CKMB, CKMBINDEX, TROPONINI in the last 168 hours. BNP: Invalid input(s): POCBNP CBG: Recent Labs  Lab 07/17/21 1622  07/17/21 2025 07/17/21 2350 07/18/21 0510 07/18/21 0810  GLUCAP 97 101* 113* 116* 148*   D-Dimer No results for input(s): DDIMER in the last 72 hours. Hgb A1c No results for input(s): HGBA1C in the last 72 hours. Lipid Profile No results for input(s): CHOL, HDL, LDLCALC, TRIG, CHOLHDL, LDLDIRECT in the last 72 hours. Thyroid function studies No results for input(s): TSH, T4TOTAL, T3FREE, THYROIDAB in the last 72 hours.  Invalid input(s): FREET3 Anemia work up No results for input(s): VITAMINB12, FOLATE, FERRITIN, TIBC, IRON, RETICCTPCT in the last 72 hours. Urinalysis    Component Value Date/Time   COLORURINE AMBER (A) 07/12/2021 1922   APPEARANCEUR CLOUDY (A) 07/12/2021 1922   LABSPEC 1.015 07/12/2021 1922   PHURINE 6.0 07/12/2021 1922   GLUCOSEU NEGATIVE 07/12/2021 1922   HGBUR NEGATIVE 07/12/2021 1922   BILIRUBINUR NEGATIVE 07/12/2021 1922   KETONESUR 5 (A) 07/12/2021 1922   PROTEINUR 100 (A) 07/12/2021 1922   UROBILINOGEN 0.2 09/08/2014 1800   NITRITE NEGATIVE 07/12/2021 1922   LEUKOCYTESUR LARGE (A) 07/12/2021 1922   Sepsis Labs Invalid input(s): PROCALCITONIN,  WBC,  LACTICIDVEN Microbiology Recent Results (from the past 240 hour(s))  Urine Culture     Status: Abnormal   Collection Time: 07/12/21  7:22 PM   Specimen: Urine, Clean Catch  Result Value Ref Range Status   Specimen Description   Final    URINE, CLEAN CATCH Performed at [facility], [place] [address] [place], [place] [address]    Special Requests   Final    Immunocompromised Performed at [facility], [place] [address] [place], [place] [address]    Culture (A)  Final    <10,000 COLONIES/mL INSIGNIFICANT GROWTH Performed at [place] [address], [place], [place] [address]    Report Status 07/14/2021 FINAL  Final  Resp Panel by RT-PCR (Flu A&B, Covid) Nasopharyngeal Swab     Status: None   Collection Time: 07/12/21  9:55 PM   Specimen:  Nasopharyngeal Swab; Nasopharyngeal(NP) swabs in vial transport medium  Result Value Ref Range Status   SARS Coronavirus 2 by RT PCR NEGATIVE NEGATIVE Final    Comment: (NOTE)  SARS-CoV-2 target nucleic acids are NOT DETECTED.  The SARS-CoV-2 RNA is generally detectable in upper respiratory specimens during the acute phase of infection. The lowest concentration of SARS-CoV-2 viral copies this assay can detect is 138 copies/mL. A negative result does not preclude SARS-Cov-2 infection and should not be used as the sole basis for treatment or other patient management decisions. A negative result may occur with  improper specimen collection/handling, submission of specimen other than nasopharyngeal swab, presence of viral mutation(s) within the areas targeted by this assay, and inadequate number of viral copies(<138 copies/mL). A negative result must be combined with clinical observations, patient history, and epidemiological information. The expected result is Negative.  Fact Sheet for Patients:  EntrepreneurPulse.com.au  Fact Sheet for Healthcare Providers:  IncredibleEmployment.be  This test is no t yet approved or cleared by the [place] FDA and  has been authorized for detection and/or diagnosis of SARS-CoV-2 by FDA under an Emergency Use Authorization (EUA). This EUA will remain  in effect (meaning this test can be used) for the duration of the COVID-19 declaration under Section 564(b)(1) of the Act, 21 U.S.C.section 360bbb-3(b)(1), unless the authorization is terminated  or revoked sooner.       Influenza A by PCR NEGATIVE NEGATIVE Final   Influenza B by PCR NEGATIVE NEGATIVE Final    Comment: (NOTE) The Xpert Xpress SARS-CoV-2/FLU/RSV plus assay is intended as an aid in the diagnosis of influenza from Nasopharyngeal swab specimens and should not be used as a sole basis for treatment. Nasal washings and aspirates are unacceptable for  Xpert Xpress SARS-CoV-2/FLU/RSV testing.  Fact Sheet for Patients: EntrepreneurPulse.com.au  Fact Sheet for Healthcare Providers: IncredibleEmployment.be  This test is not yet approved or cleared by the [place] FDA and has been authorized for detection and/or diagnosis of SARS-CoV-2 by FDA under an Emergency Use Authorization (EUA). This EUA will remain in effect (meaning this test can be used) for the duration of the COVID-19 declaration under Section 564(b)(1) of the Act, 21 U.S.C. section 360bbb-3(b)(1), unless the authorization is terminated or revoked.  Performed at [facility], [place] [address] [place], [place] [address]      Time coordinating discharge:  35 minutes  SIGNED:   Barb Merino, MD  [facility] 07/18/2021, 11:03 AM

## 2021-07-18 NOTE — TOC Transition Note (Signed)
Transition of Care [facility]) - CM/SW Discharge Note   Patient Details  Name: Maylanie Schluter MRN: [id number] Date of Birth: 1966-04-11  [facility] CM/SW Contact:  Leeroy Cha, RN Phone Number: 07/18/2021, 12:11 PM   Clinical Narrative:    Rn through [facility] ordered for hhc   Final next level of care: Home w Home Health Services Barriers to Discharge: Barriers Resolved   Patient Goals and CMS Choice        Discharge Placement                       Discharge Plan and Services     Post Acute Care Choice: Home Health                    HH Arranged: RN [facility] Agency: [place] Date [place]: 07/18/21 Time [place]: 1211 Representative spoke with at [place]: scarlett  Social Determinants of Health ([place]) Interventions     Readmission Risk Interventions Readmission Risk Prevention Plan 07/16/2021 06/23/2021  Transportation Screening Complete Complete  Medication Review Press photographer) Complete Complete  PCP or Specialist appointment within 3-5 days of discharge Complete -  [place] or [place] Complete Complete  SW Recovery Care/Counseling Consult Complete Complete  Palliative Care Screening Not Applicable Not [place] Not Applicable Not Applicable  Some recent data might be hidden

## 2021-07-19 ENCOUNTER — Other Ambulatory Visit: Payer: Self-pay | Admitting: Oncology

## 2021-07-21 ENCOUNTER — Other Ambulatory Visit: Payer: Self-pay | Admitting: Internal Medicine

## 2021-07-21 ENCOUNTER — Other Ambulatory Visit: Payer: Self-pay | Admitting: Nurse Practitioner

## 2021-07-21 ENCOUNTER — Telehealth: Payer: Self-pay | Admitting: *Deleted

## 2021-07-21 ENCOUNTER — Other Ambulatory Visit: Payer: Self-pay

## 2021-07-21 DIAGNOSIS — C250 Malignant neoplasm of head of pancreas: Secondary | ICD-10-CM

## 2021-07-21 DIAGNOSIS — C259 Malignant neoplasm of pancreas, unspecified: Secondary | ICD-10-CM

## 2021-07-21 MED ORDER — SCOPOLAMINE 1 MG/3DAYS TD PT72: 1.0000 | MEDICATED_PATCH | TRANSDERMAL | 12 refills | Status: DC

## 2021-07-21 MED ORDER — MORPHINE SULFATE (CONCENTRATE) 10 MG/0.5ML PO SOLN: 10.0000 mg | ORAL | 0 refills | Status: DC | PRN

## 2021-07-21 MED ORDER — MORPHINE SULFATE ER 60 MG PO TBCR: 60.0000 mg | EXTENDED_RELEASE_TABLET | Freq: Two times a day (BID) | ORAL | 0 refills | Status: DC

## 2021-07-21 NOTE — Telephone Encounter (Addendum)
Patient had called [facility] to report her feeding tube is clogged. This RN called her and confirmed it is the J-tube portion of G-J tube that does not flush or aspirate and this is her feeding portion of tube. Informed her will see if IR can see her today. Spoke w/patient again today and IR will see her tomorrow at 1030. She says she is able to eat some and drink fluids and will be OK till then. Requested refill on MS Contin (only received 5 day supply), Scopolamine and needs morphine sulfate liquid sent to [facility] on [address] in [place] since her [facility] does not carry it. Called [facility] to cancel the script to allow office to send new one to [facility].

## 2021-07-22 ENCOUNTER — Other Ambulatory Visit: Payer: Self-pay | Admitting: Oncology

## 2021-07-22 ENCOUNTER — Ambulatory Visit (HOSPITAL_COMMUNITY)
Admission: RE | Admit: 2021-07-22 | Discharge: 2021-07-22 | Disposition: A | Discharge status: Home / Self Care | Payer: Medicaid Other | Source: Ambulatory Visit | Attending: Oncology | Admitting: Oncology

## 2021-07-22 ENCOUNTER — Telehealth: Payer: Self-pay

## 2021-07-22 ENCOUNTER — Other Ambulatory Visit: Payer: Self-pay

## 2021-07-22 DIAGNOSIS — Z431 Encounter for attention to gastrostomy: Secondary | ICD-10-CM

## 2021-07-22 DIAGNOSIS — C250 Malignant neoplasm of head of pancreas: Secondary | ICD-10-CM | POA: Diagnosis present

## 2021-07-22 NOTE — Telephone Encounter (Signed)
VM message left for Pt informing her that Wendy Card NP refilled her medication yesterday and it went to pharmacy in [facility]

## 2021-07-22 NOTE — Telephone Encounter (Signed)
-----   Message from Riley Lam sent at 07/22/2021  9:56 AM EDT ----- Regarding: Wendy Hoffman Patient needs her rx/s refilled to [facility] pharmacy for Morphene 60 day & the liquid also she stated.  Thanks [employer]

## 2021-07-24 ENCOUNTER — Inpatient Hospital Stay: Payer: Medicaid Other | Admitting: Nurse Practitioner

## 2021-07-24 ENCOUNTER — Inpatient Hospital Stay: Payer: Medicaid Other

## 2021-07-24 ENCOUNTER — Telehealth: Payer: Self-pay

## 2021-07-24 NOTE — Telephone Encounter (Signed)
Called message left concerning missed appointment pt encouraged to return call and scheduling message sent

## 2021-07-28 ENCOUNTER — Telehealth: Payer: Self-pay | Admitting: *Deleted

## 2021-07-28 NOTE — Telephone Encounter (Signed)
Notified by Varney Baas that MS Contin 60 mg has been approved 07/28/21 to 07/28/22. Notified Tracy of approval. Ran script through and it was accepted. They will call patient when ready.

## 2021-07-30 ENCOUNTER — Encounter: Payer: Self-pay | Admitting: Nurse Practitioner

## 2021-07-30 ENCOUNTER — Inpatient Hospital Stay: Payer: Medicaid Other

## 2021-07-30 ENCOUNTER — Inpatient Hospital Stay: Payer: Medicaid Other | Attending: Oncology

## 2021-07-30 ENCOUNTER — Encounter: Payer: Self-pay | Admitting: *Deleted

## 2021-07-30 ENCOUNTER — Inpatient Hospital Stay (HOSPITAL_BASED_OUTPATIENT_CLINIC_OR_DEPARTMENT_OTHER): Payer: Medicaid Other | Admitting: Nurse Practitioner

## 2021-07-30 ENCOUNTER — Other Ambulatory Visit: Payer: Self-pay

## 2021-07-30 VITALS — BP 100/67 | HR 80 | Temp 98.2°F | Resp 20 | Ht 64.0 in | Wt 190.0 lb

## 2021-07-30 DIAGNOSIS — C250 Malignant neoplasm of head of pancreas: Secondary | ICD-10-CM | POA: Insufficient documentation

## 2021-07-30 DIAGNOSIS — C787 Secondary malignant neoplasm of liver and intrahepatic bile duct: Secondary | ICD-10-CM | POA: Insufficient documentation

## 2021-07-30 DIAGNOSIS — G893 Neoplasm related pain (acute) (chronic): Secondary | ICD-10-CM | POA: Insufficient documentation

## 2021-07-30 DIAGNOSIS — Z5111 Encounter for antineoplastic chemotherapy: Secondary | ICD-10-CM | POA: Diagnosis present

## 2021-07-30 DIAGNOSIS — Z8673 Personal history of transient ischemic attack (TIA), and cerebral infarction without residual deficits: Secondary | ICD-10-CM | POA: Insufficient documentation

## 2021-07-30 DIAGNOSIS — I10 Essential (primary) hypertension: Secondary | ICD-10-CM | POA: Diagnosis not present

## 2021-07-30 DIAGNOSIS — D509 Iron deficiency anemia, unspecified: Secondary | ICD-10-CM | POA: Insufficient documentation

## 2021-07-30 DIAGNOSIS — E876 Hypokalemia: Secondary | ICD-10-CM | POA: Diagnosis not present

## 2021-07-30 DIAGNOSIS — Z79899 Other long term (current) drug therapy: Secondary | ICD-10-CM | POA: Insufficient documentation

## 2021-07-30 LAB — CBC WITH DIFFERENTIAL (CANCER CENTER ONLY)
Abs Immature Granulocytes: 0.02 10*3/uL (ref 0.00–0.07)
Basophils Absolute: 0 10*3/uL (ref 0.0–0.1)
Basophils Relative: 1 %
Eosinophils Absolute: 0.1 10*3/uL (ref 0.0–0.5)
Eosinophils Relative: 2 %
HCT: 36.7 % (ref 36.0–46.0)
Hemoglobin: 12 g/dL (ref 12.0–15.0)
Immature Granulocytes: 0 %
Lymphocytes Relative: 21 %
Lymphs Abs: 1.1 10*3/uL (ref 0.7–4.0)
MCH: 30.2 pg (ref 26.0–34.0)
MCHC: 32.7 g/dL (ref 30.0–36.0)
MCV: 92.4 fL (ref 80.0–100.0)
Monocytes Absolute: 0.4 10*3/uL (ref 0.1–1.0)
Monocytes Relative: 8 %
Neutro Abs: 3.6 10*3/uL (ref 1.7–7.7)
Neutrophils Relative %: 68 %
Platelet Count: 306 10*3/uL (ref 150–400)
RBC: 3.97 MIL/uL (ref 3.87–5.11)
RDW: 15 % (ref 11.5–15.5)
WBC Count: 5.3 10*3/uL (ref 4.0–10.5)
nRBC: 0 % (ref 0.0–0.2)

## 2021-07-30 LAB — CMP (CANCER CENTER ONLY)
ALT: 11 U/L (ref 0–44)
AST: 27 U/L (ref 15–41)
Albumin: 3.3 g/dL — ABNORMAL LOW (ref 3.5–5.0)
Alkaline Phosphatase: 92 U/L (ref 38–126)
Anion gap: 12 (ref 5–15)
BUN: 9 mg/dL (ref 6–20)
CO2: 29 mmol/L (ref 22–32)
Calcium: 9.2 mg/dL (ref 8.9–10.3)
Chloride: 99 mmol/L (ref 98–111)
Creatinine: 0.89 mg/dL (ref 0.44–1.00)
GFR, Estimated: >60 mL/min (ref >60)
Glucose, Bld: 103 mg/dL — ABNORMAL HIGH (ref 70–99)
Potassium: 2.7 mmol/L — CL (ref 3.5–5.1)
Sodium: 140 mmol/L (ref 135–145)
Total Bilirubin: 0.7 mg/dL (ref 0.3–1.2)
Total Protein: 6 g/dL — ABNORMAL LOW (ref 6.5–8.1)

## 2021-07-30 MED ORDER — PACLITAXEL PROTEIN-BOUND CHEMO INJECTION 100 MG
100.0000 mg/m2 | Freq: Once | INTRAVENOUS | Status: AC
  Administered 2021-07-30: 200 mg via INTRAVENOUS
  Filled 2021-07-30: qty 40

## 2021-07-30 MED ORDER — SODIUM CHLORIDE 0.9% FLUSH
10.0000 mL | INTRAVENOUS | Status: DC | PRN
Start: 2021-07-30 — End: 2021-07-30
  Administered 2021-07-30: 10 mL

## 2021-07-30 MED ORDER — ONDANSETRON HCL 4 MG PO TABS: 4.0000 mg | ORAL_TABLET | Freq: Four times a day (QID) | ORAL | 1 refills | Status: AC | PRN

## 2021-07-30 MED ORDER — ZOLPIDEM TARTRATE 10 MG PO TABS: 10.0000 mg | ORAL_TABLET | Freq: Every evening | ORAL | 0 refills | Status: DC | PRN

## 2021-07-30 MED ORDER — PROCHLORPERAZINE MALEATE 10 MG PO TABS: ORAL_TABLET | ORAL | 2 refills | Status: DC

## 2021-07-30 MED ORDER — SODIUM CHLORIDE 0.9 % IV SOLN
1000.0000 mg/m2 | Freq: Once | INTRAVENOUS | Status: AC
  Administered 2021-07-30: 1976 mg via INTRAVENOUS
  Filled 2021-07-30: qty 51.97

## 2021-07-30 MED ORDER — HEPARIN SOD (PORK) LOCK FLUSH 100 UNIT/ML IV SOLN
500.0000 [IU] | Freq: Once | INTRAVENOUS | Status: AC | PRN
  Administered 2021-07-30: 500 [IU]

## 2021-07-30 MED ORDER — SODIUM CHLORIDE 0.9 % IV SOLN: Freq: Once | INTRAVENOUS | Status: AC

## 2021-07-30 MED ORDER — PROCHLORPERAZINE MALEATE 10 MG PO TABS
10.0000 mg | ORAL_TABLET | Freq: Once | ORAL | Status: AC
  Administered 2021-07-30: 10 mg via ORAL
  Filled 2021-07-30: qty 1

## 2021-07-30 MED ORDER — PANTOPRAZOLE SODIUM 40 MG PO TBEC: 40.0000 mg | DELAYED_RELEASE_TABLET | Freq: Two times a day (BID) | ORAL | 0 refills | Status: DC

## 2021-07-30 NOTE — Progress Notes (Signed)
CRITICAL VALUE STICKER  CRITICAL VALUE:K+ 2.7   RECEIVER (on-site recipient of call):Kyannah Climer,RN  DATE & TIME NOTIFIED: 07/30/21 @ 1050  MESSENGER (representative from lab):Otila Kluver  MD NOTIFIED: Ned Card, NP  TIME OF NOTIFICATION:1055  RESPONSE: Patient will resume oral K+

## 2021-07-30 NOTE — Patient Instructions (Signed)
Wendy Hoffman  Discharge Instructions: Thank you for choosing [facility] to provide your oncology and hematology care.   If you have a lab appointment with the [facility], please go directly to the [facility] and check in at the registration area.   Wear comfortable clothing and clothing appropriate for easy access to any Portacath or PICC line.   We strive to give you quality time with your provider. You may need to reschedule your appointment if you arrive late (15 or more minutes).  Arriving late affects you and other patients whose appointments are after yours.  Also, if you miss three or more appointments without notifying the office, you may be dismissed from the clinic at the provider's discretion.      For prescription refill requests, have your pharmacy contact our office and allow 72 hours for refills to be completed.    Today you received the following chemotherapy and/or immunotherapy agents: paclitaxel protein bound (Abraxane), gemcitabine (Gemzar).     To help prevent nausea and vomiting after your treatment, we encourage you to take your nausea medication as directed.  BELOW ARE SYMPTOMS THAT SHOULD BE REPORTED IMMEDIATELY: *FEVER GREATER THAN 100.4 F (38 C) OR HIGHER *CHILLS OR SWEATING *NAUSEA AND VOMITING THAT IS NOT CONTROLLED WITH YOUR NAUSEA MEDICATION *UNUSUAL SHORTNESS OF BREATH *UNUSUAL BRUISING OR BLEEDING *URINARY PROBLEMS (pain or burning when urinating, or frequent urination) *BOWEL PROBLEMS (unusual diarrhea, constipation, pain near the anus) TENDERNESS IN MOUTH AND THROAT WITH OR WITHOUT PRESENCE OF ULCERS (sore throat, sores in mouth, or a toothache) UNUSUAL RASH, SWELLING OR PAIN  UNUSUAL VAGINAL DISCHARGE OR ITCHING   Items with * indicate a potential emergency and should be followed up as soon as possible or go to the Emergency Department if any problems should occur.  Please show the CHEMOTHERAPY ALERT  CARD or IMMUNOTHERAPY ALERT CARD at check-in to the Emergency Department and triage nurse.  Should you have questions after your visit or need to cancel or reschedule your appointment, please contact [facility]  Dept: [phone]  and follow the prompts.  Office hours are 8:00 a.m. to 4:30 p.m. Monday - Friday. Please note that voicemails left after 4:00 p.m. may not be returned until the following business day.  We are closed weekends and major holidays. You have access to a nurse at all times for urgent questions. Please call the main number to the clinic Dept: [phone] and follow the prompts.   For any non-urgent questions, you may also contact your provider using MyChart. We now offer e-Visits for anyone 66 and older to request care online for non-urgent symptoms. For details visit [email or web address].   Also download the MyChart app! Go to the app store, search "MyChart", open the app, select [facility], and log in with your MyChart username and password.  Due to Covid, a mask is required upon entering the hospital/clinic. If you do not have a mask, one will be given to you upon arrival. For doctor visits, patients may have 1 support person aged 26 or older with them. For treatment visits, patients cannot have anyone with them due to current Covid guidelines and our immunocompromised population.   Nanoparticle Albumin-Bound Paclitaxel injection What is this medication? NANOPARTICLE ALBUMIN-BOUND PACLITAXEL (Na no PAHR ti kuhl al BYOO muhn-bound PAK li TAX el) is a chemotherapy drug. It targets fast dividing cells, like cancer cells, and causes these cells to die. This medicine is used  to treatadvanced breast cancer, lung cancer, and pancreatic cancer. This medicine may be used for other purposes; ask your health care provider orpharmacist if you have questions. COMMON BRAND NAME(S): Abraxane What should I tell my care team before I take this  medication? They need to know if you have any of these conditions: kidney disease liver disease low blood counts, like low white cell, platelet, or red cell counts lung or breathing disease, like asthma tingling of the fingers or toes, or other nerve disorder an unusual or allergic reaction to paclitaxel, albumin, other chemotherapy, other medicines, foods, dyes, or preservatives pregnant or trying to get pregnant breast-feeding How should I use this medication? This drug is given as an infusion into a vein. It is administered in a hospitalor clinic by a specially trained health care professional. Talk to your pediatrician regarding the use of this medicine in children.Special care may be needed. Overdosage: If you think you have taken too much of this medicine contact apoison control center or emergency room at once. NOTE: This medicine is only for you. Do not share this medicine with others. What if I miss a dose? It is important not to miss your dose. Call your doctor or health careprofessional if you are unable to keep an appointment. What may interact with this medication? This medicine may interact with the following medications: antiviral medicines for hepatitis, HIV or AIDS certain antibiotics like erythromycin and clarithromycin certain medicines for fungal infections like ketoconazole and itraconazole certain medicines for seizures like carbamazepine, phenobarbital, phenytoin gemfibrozil nefazodone rifampin St. John's wort This list may not describe all possible interactions. Give your health care provider a list of all the medicines, herbs, non-prescription drugs, or dietary supplements you use. Also tell them if you smoke, drink alcohol, or use illegaldrugs. Some items may interact with your medicine. What should I watch for while using this medication? Your condition will be monitored carefully while you are receiving this medicine. You will need important blood work done  while you are taking thismedicine. This medicine can cause serious allergic reactions. If you experience allergic reactions like skin rash, itching or hives, swelling of the face, lips, ortongue, tell your doctor or health care professional right away. In some cases, you may be given additional medicines to help with side effects.Follow all directions for their use. This drug may make you feel generally unwell. This is not uncommon, as chemotherapy can affect healthy cells as well as cancer cells. Report any side effects. Continue your course of treatment even though you feel ill unless yourdoctor tells you to stop. Call your doctor or health care professional for advice if you get a fever, chills or sore throat, or other symptoms of a cold or flu. Do not treat yourself. This drug decreases your body's ability to fight infections. Try toavoid being around people who are sick. This medicine may increase your risk to bruise or bleed. Call your doctor orhealth care professional if you notice any unusual bleeding. Be careful brushing and flossing your teeth or using a toothpick because you may get an infection or bleed more easily. If you have any dental work done,tell your dentist you are receiving this medicine. Avoid taking products that contain aspirin, acetaminophen, ibuprofen, naproxen, or ketoprofen unless instructed by your doctor. These medicines may hide afever. Do not become pregnant while taking this medicine or for 6 months after stopping it. Women should inform their doctor if they wish to become pregnant or think they might be pregnant.  Men should not father a child while taking this medicine or for 3 months after stopping it. There is a potential for serious side effects to an unborn child. Talk to your health care professionalor pharmacist for more information. Do not breast-feed an infant while taking this medicine or for 2 weeks afterstopping it. This medicine may interfere with the ability  to get pregnant or to father a child. You should talk to your doctor or health care professional if you areconcerned about your fertility. What side effects may I notice from receiving this medication? Side effects that you should report to your doctor or health care professionalas soon as possible: allergic reactions like skin rash, itching or hives, swelling of the face, lips, or tongue breathing problems changes in vision fast, irregular heartbeat low blood pressure mouth sores pain, tingling, numbness in the hands or feet signs of decreased platelets or bleeding - bruising, pinpoint red spots on the skin, black, tarry stools, blood in the urine signs of decreased red blood cells - unusually weak or tired, feeling faint or lightheaded, falls signs of infection - fever or chills, cough, sore throat, pain or difficulty passing urine signs and symptoms of liver injury like dark yellow or brown urine; general ill feeling or flu-like symptoms; light-colored stools; loss of appetite; nausea; right upper belly pain; unusually weak or tired; yellowing of the eyes or skin swelling of the ankles, feet, hands unusually slow heartbeat Side effects that usually do not require medical attention (report to yourdoctor or health care professional if they continue or are bothersome): diarrhea hair loss loss of appetite nausea, vomiting tiredness This list may not describe all possible side effects. Call your doctor for medical advice about side effects. You may report side effects to FDA at1-800-FDA-1088. Where should I keep my medication? This drug is given in a hospital or clinic and will not be stored at home. NOTE: This sheet is a summary. It may not cover all possible information. If you have questions about this medicine, talk to your doctor, pharmacist, orhealth care provider.  2022 Elsevier/Gold Standard (2017-08-02 13:03:45)  Gemcitabine injection What is this medication? GEMCITABINE (jem  SYE ta been) is a chemotherapy drug. This medicine is used to treat many types of cancer like breast cancer, lung cancer, pancreatic cancer,and ovarian cancer. This medicine may be used for other purposes; ask your health care provider orpharmacist if you have questions. COMMON BRAND NAME(S): Gemzar, Infugem What should I tell my care team before I take this medication? They need to know if you have any of these conditions: blood disorders infection kidney disease liver disease lung or breathing disease, like asthma recent or ongoing radiation therapy an unusual or allergic reaction to gemcitabine, other chemotherapy, other medicines, foods, dyes, or preservatives pregnant or trying to get pregnant breast-feeding How should I use this medication? This drug is given as an infusion into a vein. It is administered in a hospitalor clinic by a specially trained health care professional. Talk to your pediatrician regarding the use of this medicine in children.Special care may be needed. Overdosage: If you think you have taken too much of this medicine contact apoison control center or emergency room at once. NOTE: This medicine is only for you. Do not share this medicine with others. What if I miss a dose? It is important not to miss your dose. Call your doctor or health careprofessional if you are unable to keep an appointment. What may interact with this medication? medicines to increase  blood counts like filgrastim, pegfilgrastim, sargramostim some other chemotherapy drugs like cisplatin vaccines Talk to your doctor or health care professional before taking any of thesemedicines: acetaminophen aspirin ibuprofen ketoprofen naproxen This list may not describe all possible interactions. Give your health care provider a list of all the medicines, herbs, non-prescription drugs, or dietary supplements you use. Also tell them if you smoke, drink alcohol, or use illegaldrugs. Some items may  interact with your medicine. What should I watch for while using this medication? Visit your doctor for checks on your progress. This drug may make you feel generally unwell. This is not uncommon, as chemotherapy can affect healthy cells as well as cancer cells. Report any side effects. Continue your course oftreatment even though you feel ill unless your doctor tells you to stop. In some cases, you may be given additional medicines to help with side effects.Follow all directions for their use. Call your doctor or health care professional for advice if you get a fever, chills or sore throat, or other symptoms of a cold or flu. Do not treat yourself. This drug decreases your body's ability to fight infections. Try toavoid being around people who are sick. This medicine may increase your risk to bruise or bleed. Call your doctor orhealth care professional if you notice any unusual bleeding. Be careful brushing and flossing your teeth or using a toothpick because you may get an infection or bleed more easily. If you have any dental work done,tell your dentist you are receiving this medicine. Avoid taking products that contain aspirin, acetaminophen, ibuprofen, naproxen, or ketoprofen unless instructed by your doctor. These medicines may hide afever. Do not become pregnant while taking this medicine or for 6 months after stopping it. Women should inform their doctor if they wish to become pregnant or think they might be pregnant. Men should not father a child while taking this medicine and for 3 months after stopping it. There is a potential for serious side effects to an unborn child. Talk to your health care professional or pharmacist for more information. Do not breast-feed an infant while takingthis medicine or for at least 1 week after stopping it. Men should inform their doctors if they wish to father a child. This medicine may lower sperm counts. Talk with your doctor or health care professional ifyou  are concerned about your fertility. What side effects may I notice from receiving this medication? Side effects that you should report to your doctor or health care professionalas soon as possible: allergic reactions like skin rash, itching or hives, swelling of the face, lips, or tongue breathing problems pain, redness, or irritation at site where injected signs and symptoms of a dangerous change in heartbeat or heart rhythm like chest pain; dizziness; fast or irregular heartbeat; palpitations; feeling faint or lightheaded, falls; breathing problems signs of decreased platelets or bleeding - bruising, pinpoint red spots on the skin, black, tarry stools, blood in the urine signs of decreased red blood cells - unusually weak or tired, feeling faint or lightheaded, falls signs of infection - fever or chills, cough, sore throat, pain or difficulty passing urine signs and symptoms of kidney injury like trouble passing urine or change in the amount of urine signs and symptoms of liver injury like dark yellow or brown urine; general ill feeling or flu-like symptoms; light-colored stools; loss of appetite; nausea; right upper belly pain; unusually weak or tired; yellowing of the eyes or skin swelling of ankles, feet, hands Side effects that usually do not  require medical attention (report to yourdoctor or health care professional if they continue or are bothersome): constipation diarrhea hair loss loss of appetite nausea rash vomiting This list may not describe all possible side effects. Call your doctor for medical advice about side effects. You may report side effects to FDA at1-800-FDA-1088. Where should I keep my medication? This drug is given in a hospital or clinic and will not be stored at home. NOTE: This sheet is a summary. It may not cover all possible information. If you have questions about this medicine, talk to your doctor, pharmacist, orhealth care provider.  2022 Elsevier/Gold  Standard (2018-02-22 18:06:11)

## 2021-07-30 NOTE — Progress Notes (Signed)
[facility] OFFICE PROGRESS NOTE   Diagnosis: Pancreas cancer  INTERVAL HISTORY:   Ms. Raetz returns for follow-up.  She completed a cycle of FOLFIRINOX 05/27/2021.  Hospitalized 06/17/2021 through 07/07/2021 with intractable nausea/vomiting.  GJ tube placed on 07/01/2021 and tube feedings initiated.  Readmitted 07/12/2021 with pain at the tube site and nausea/vomiting.  Discharged 07/18/2021.  She reports diarrhea and crampy abdominal pain after each tube feeding.  She discontinued 2 feedings about a week ago.  She wants the feeding tube removed.  She denies nausea/vomiting.  Reports she is tolerating a regular diet in small amounts.  She thinks she will be able to eat more if the feeding tube is removed.  She denies numbness/tingling in the hands and feet.  She stopped taking potassium about a week ago.  Objective:  Vital signs in last 24 hours:  Blood pressure 100/67, pulse 80, temperature 98.2 F (36.8 C), temperature source Oral, resp. rate 20, height _0  (1.626 m), weight 190 lb (86.2 kg), SpO2 99 %.    HEENT: Mild white coating over tongue.  No ulcers. Resp: Lungs clear bilaterally. Cardio: Regular rate and rhythm. GI: Abdomen is soft and nontender.  No hepatomegaly.  Feeding tube site without erythema. Vascular: No leg edema. Port-A-Cath without erythema.  Lab Results:  Lab Results  Component Value Date   WBC 5.3 07/30/2021   HGB 12.0 07/30/2021   HCT 36.7 07/30/2021   MCV 92.4 07/30/2021   PLT 306 07/30/2021   NEUTROABS 3.6 07/30/2021    Imaging:  No results found.  Medications: I have reviewed the patient's current medications.  Assessment/Plan: 1. Pancreas cancer -CT abdomen/pelvis without contrast 02/16/2021-multiple large hypoechoic masses in the patient's liver consistent metastatic disease, ill-defined masslike area in the pancreatic head measuring approximate 4.7 cm, possible filling defect in the right ventricle. -EGD 02/19/2021-large  infiltrative mass with bleeding in the second portion of the duodenum, appears to be arising near the ampulla, partially obstructive with friable mucosa.  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 -Flexible sigmoidoscopy 02/19/2021-diverticulosis in the sigmoid colon, nonbleeding external and internal hemorrhoids -Cardiac CT 02/20/2021-mass in the mid RV attached to the interventricular septum measuring 16 mm x 6 mm and concerning for metastatic tumor -MRI abdomen with/without contrast 02/21/2021-mass in the posterior pancreatic head measuring 4.3 x 3.8 cm with obstruction of the central pancreatic duct and diffuse dilatation up to 6 mm consistent with pancreatic adenocarcinoma, liver lesions the largest mass of the central left lobe measuring 9.9 x 9.2 cm consistent with hepatic metastatic disease,  hepatomegaly. -Ultrasound-guided biopsy of a left liver lesion 02/24/2021-adenocarcinoma, morphologically similar to the duodenal biopsy; microsatellite stable, tumor mutation burden 1 -Negative genetic testing -Palliative radiation to the duodenal mass 02/25/2021-03/06/2021 -Cycle 1 FOLFOX 03/17/2021 -Cycle 2 FOLFOX 04/01/2021 -Cycle 3 FOLFIRINOX 04/14/2021 -Cycle 4 FOLFIRINOX 04/27/2021, Udenyca added -Cycle 5 FOLFIRINOX 05/12/2021, Udenyca -MRI abdomen 05/23/2021-decrease in size of pancreas mass and hepatic lesions, no progressive disease -Cycle 6 FOLFIRINOX 05/27/2021 -CT 06/17/2021-multiple liver metastases similar to her prior exam.  Pancreas mass not significantly changed. -CT 06/26/2021-stable pancreas mass, no significant change in size and number of known liver metastases. -CT abdomen/pelvis 07/12/2021-new gastrostomy tube.  Multiple liver masses slightly decreased in size from prior exam.  Pancreas head mass unchanged. -Cycle 1 gemcitabine/Abraxane 07/30/2021 2.  Iron deficiency anemia-improved -Feraheme 510 mg 02/20/2021 3.  Protein calorie malnutrition 4.  Possible right  ventricular filling defect on noncontrast CT scan MRI cardiac morphology 02/21/2021-mass in the mid RV  attached to the interventricular septum measures 16 mm x 6 mm.  Mass appears heterogeneous with area of enhancement on LGE imaging.  Mass is concerning for metastatic tumor.  Normal LV size and systolic function.  Normal RV size and systolic function. 5.  Hypertension 6.  History of CVA 7.  Hyperlipidemia 8.  Hypothyroidism 9.  Morbid obesity 10.  Asthma 11.  Migraines 12.  Pain secondary to #1 13.  Port-A-Cath placement 02/25/2021 14.  Hypokalemia 04/01/2021-started a potassium supplement 15.  Hospital admission 06/17/2021-intractable nausea and vomiting EGD 06/19/2021-diffuse gastritis, nonobstructing duodenal mass, biopsy of stomach-no malignancy or H. pylori, duodenal mass-adenocarcinoma 16.  Hospital admission 07/12/2021 with nausea/vomiting, abdominal pain, and hypokalemia  Disposition: Ms. Laine appears stable.  She is seen today prior to beginning every 2-week Gemcitabine/Abraxane.  We reviewed potential toxicities including bone marrow toxicity, nausea, hair loss, diarrhea or constipation, mouth sores.  We discussed the possibility of a fever or skin rash with Gemcitabine.  We discussed pneumonitis associated with Gemcitabine.  We discussed the neuropathy associated with Abraxane.  She agrees to proceed.  Plan to proceed with cycle 1 gemcitabine/Abraxane today as scheduled.  We reviewed the CBC and chemistry panel from today.  Labs adequate to proceed with treatment.  She has hypokalemia, reports discontinuing oral potassium a week ago.  She will resume potassium as she was previously taking.  She wants the feeding tube removed.  We discussed adjusting the tube feeding to see if this helps with the symptoms she is experiencing.  We also discussed leaving the tube in place until we see how she tolerates the new chemotherapy.  She agrees to leave the feeding tube in place for now.  Referral  made to [facility] dietitian.  She will return for lab, follow-up, cycle 2 gemcitabine/Abraxane in 2 weeks.  She will contact the office in the interim with any problems.  Patient seen with Dr. Benay Spice.    [name]   07/30/2021  10:39 AM  This was a shared visit with Ned Card.  Ms. Jon was interviewed and examined.  She will begin gemcitabine/Abraxane today.  She agrees to keep the feeding tube in place and meet with the Cancer center nutritionist.  I was present for greater than 50% of today's visit.  I performed medical decision making.  Julieanne Manson, MD

## 2021-07-30 NOTE — Patient Instructions (Signed)
Implanted Port Home Guide An implanted port is a device that is placed under the skin. It is usually placed in the chest. The device can be used to give IV medicine, to take blood, or for dialysis. You may have an implanted port if: You need IV medicine that would be irritating to the small veins in your hands or arms. You need IV medicines, such as antibiotics, for a long period of time. You need IV nutrition for a long period of time. You need dialysis. When you have a port, your health care provider can choose to use the port instead of veins in your arms for these procedures. You may have fewer limitations when using a port than you would if you used other types of long-term IVs, and you will likely be able to return to normal activities afteryour incision heals. An implanted port has two main parts: Reservoir. The reservoir is the part where a needle is inserted to give medicines or draw blood. The reservoir is round. After it is placed, it appears as a small, raised area under your skin. Catheter. The catheter is a thin, flexible tube that connects the reservoir to a vein. Medicine that is inserted into the reservoir goes into the catheter and then into the vein. How is my port accessed? To access your port: A numbing cream may be placed on the skin over the port site. Your health care provider will put on a mask and sterile gloves. The skin over your port will be cleaned carefully with a germ-killing soap and allowed to dry. Your health care provider will gently pinch the port and insert a needle into it. Your health care provider will check for a blood return to make sure the port is in the vein and is not clogged. If your port needs to remain accessed to get medicine continuously (constant infusion), your health care provider will place a clear bandage (dressing) over the needle site. The dressing and needle will need to be changed every week, or as told by your health care provider. What  is flushing? Flushing helps keep the port from getting clogged. Follow instructions from your health care provider about how and when to flush the port. Ports are usually flushed with saline solution or a medicine called heparin. The need for flushing will depend on how the port is used: If the port is only used from time to time to give medicines or draw blood, the port may need to be flushed: Before and after medicines have been given. Before and after blood has been drawn. As part of routine maintenance. Flushing may be recommended every 4-6 weeks. If a constant infusion is running, the port may not need to be flushed. Throw away any syringes in a disposal container that is meant for sharp items (sharps container). You can buy a sharps container from a pharmacy, or you can make one by using an empty hard plastic bottle with a cover. How long will my port stay implanted? The port can stay in for as long as your health care provider thinks it is needed. When it is time for the port to come out, a surgery will be done to remove it. The surgery will be similar to the procedure that was done to putthe port in. Follow these instructions at home:  Flush your port as told by your health care provider. If you need an infusion over several days, follow instructions from your health care provider about how to take   care of your port site. Make sure you: Wash your hands with soap and water before you change your dressing. If soap and water are not available, use alcohol-based hand sanitizer. Change your dressing as told by your health care provider. Place any used dressings or infusion bags into a plastic bag. Throw that bag in the trash. Keep the dressing that covers the needle clean and dry. Do not get it wet. Do not use scissors or sharp objects near the tube. Keep the tube clamped, unless it is being used. Check your port site every day for signs of infection. Check for: Redness, swelling, or  pain. Fluid or blood. Pus or a bad smell. Protect the skin around the port site. Avoid wearing bra straps that rub or irritate the site. Protect the skin around your port from seat belts. Place a soft pad over your chest if needed. Bathe or shower as told by your health care provider. The site may get wet as long as you are not actively receiving an infusion. Return to your normal activities as told by your health care provider. Ask your health care provider what activities are safe for you. Carry a medical alert card or wear a medical alert bracelet at all times. This will let health care providers know that you have an implanted port in case of an emergency. Get help right away if: You have redness, swelling, or pain at the port site. You have fluid or blood coming from your port site. You have pus or a bad smell coming from the port site. You have a fever. Summary Implanted ports are usually placed in the chest for long-term IV access. Follow instructions from your health care provider about flushing the port and changing bandages (dressings). Take care of the area around your port by avoiding clothing that puts pressure on the area, and by watching for signs of infection. Protect the skin around your port from seat belts. Place a soft pad over your chest if needed. Get help right away if you have a fever or you have redness, swelling, pain, drainage, or a bad smell at the port site. This information is not intended to replace advice given to you by your health care provider. Make sure you discuss any questions you have with your healthcare provider. Document Revised: 04/14/2020 Document Reviewed: 04/14/2020 Elsevier Patient Education  2022 [employer]  

## 2021-07-31 ENCOUNTER — Telehealth: Payer: Self-pay

## 2021-07-31 ENCOUNTER — Encounter: Payer: Self-pay | Admitting: Oncology

## 2021-07-31 LAB — CANCER ANTIGEN 19-9: CA 19-9: 11746 U/mL — ABNORMAL HIGH (ref 0–35)

## 2021-07-31 NOTE — Telephone Encounter (Signed)
Called patient for first time chemo follow-up. Unable to reach patient.  Voicemail left for patient to call office if she had any questions or concerns.

## 2021-07-31 NOTE — Telephone Encounter (Signed)
Patient returned missed phone call.  Patient stated she tolerated treatment well.  She stated she was tired yesterday evening but feels good today.  Instructed patient to call office if she had any questions or concerns.  Patient verbalized understanding.

## 2021-08-05 ENCOUNTER — Inpatient Hospital Stay: Payer: Medicaid Other | Admitting: Nutrition

## 2021-08-05 ENCOUNTER — Other Ambulatory Visit: Payer: Self-pay

## 2021-08-05 NOTE — Progress Notes (Addendum)
Nutrition follow up completed with patient diagnosed with Pancreas cancer. Patient had feeding tube placed on July 20 but reports she does not use it. She stopped using the tube feeding about 10 days ago. States the Osmolite 1.5 gave her diarrhea and abdominal cramping "as soon as the tube feeding started." States she is eating regular food now so doesn't need the tube and wants the MD to remove it.  She denies nausea, vomiting, diarrhea, and constipation.  Reports decreased appetite. States she ate one piece of pizza and 2 cartons chocolate ensure plus yesterday. The day before, she ate a cheeseburger and 2 ensure plus. She has also tolerated a fish sandwich. She is willing to drink more ensure if she doesn't have to use the feeding tube.  Weight decreased to 188.6 pounds today, decreased from 223.7 pounds on July 04, 2021. This is a 16% wt loss in less than 3 months which is significant. Noted labs: K 2.7, Glucose 103, and Albumin 3.3.  Nutrition Diagnosis: Inadequate oral intake continues.  Intervention: Reviewed strategies for increasing oral intake. Recommended she eat or drink something with calories every 3 hours. Recommended increase to 4 cartons of ensure plus daily and add several smaller meals/snacks. Provided coupons for ensure plus. Provided nutrition fact sheets. Provided contact information.  Monitoring, Evaluation. Goals: Patient will increase calories and protein to promote weight stabilization.  Next Visit: To be scheduled.

## 2021-08-09 ENCOUNTER — Other Ambulatory Visit: Payer: Self-pay | Admitting: Oncology

## 2021-08-13 ENCOUNTER — Inpatient Hospital Stay: Payer: Medicaid Other | Attending: Oncology

## 2021-08-13 ENCOUNTER — Inpatient Hospital Stay: Payer: Medicaid Other

## 2021-08-13 ENCOUNTER — Inpatient Hospital Stay (HOSPITAL_BASED_OUTPATIENT_CLINIC_OR_DEPARTMENT_OTHER): Payer: Medicaid Other | Admitting: Oncology

## 2021-08-13 ENCOUNTER — Other Ambulatory Visit: Payer: Self-pay

## 2021-08-13 VITALS — BP 98/69 | HR 102 | Temp 98.1°F | Resp 20 | Ht 64.0 in | Wt 193.4 lb

## 2021-08-13 VITALS — HR 92

## 2021-08-13 DIAGNOSIS — Z79891 Long term (current) use of opiate analgesic: Secondary | ICD-10-CM | POA: Insufficient documentation

## 2021-08-13 DIAGNOSIS — C250 Malignant neoplasm of head of pancreas: Secondary | ICD-10-CM

## 2021-08-13 DIAGNOSIS — Z5111 Encounter for antineoplastic chemotherapy: Secondary | ICD-10-CM | POA: Diagnosis present

## 2021-08-13 DIAGNOSIS — R978 Other abnormal tumor markers: Secondary | ICD-10-CM | POA: Diagnosis not present

## 2021-08-13 DIAGNOSIS — Z931 Gastrostomy status: Secondary | ICD-10-CM | POA: Diagnosis not present

## 2021-08-13 DIAGNOSIS — I10 Essential (primary) hypertension: Secondary | ICD-10-CM | POA: Diagnosis not present

## 2021-08-13 DIAGNOSIS — C787 Secondary malignant neoplasm of liver and intrahepatic bile duct: Secondary | ICD-10-CM | POA: Insufficient documentation

## 2021-08-13 DIAGNOSIS — Z8673 Personal history of transient ischemic attack (TIA), and cerebral infarction without residual deficits: Secondary | ICD-10-CM | POA: Diagnosis not present

## 2021-08-13 DIAGNOSIS — G43909 Migraine, unspecified, not intractable, without status migrainosus: Secondary | ICD-10-CM | POA: Insufficient documentation

## 2021-08-13 DIAGNOSIS — G620 Drug-induced polyneuropathy: Secondary | ICD-10-CM | POA: Diagnosis not present

## 2021-08-13 DIAGNOSIS — B370 Candidal stomatitis: Secondary | ICD-10-CM | POA: Diagnosis not present

## 2021-08-13 DIAGNOSIS — D509 Iron deficiency anemia, unspecified: Secondary | ICD-10-CM | POA: Insufficient documentation

## 2021-08-13 DIAGNOSIS — J45909 Unspecified asthma, uncomplicated: Secondary | ICD-10-CM | POA: Insufficient documentation

## 2021-08-13 DIAGNOSIS — E785 Hyperlipidemia, unspecified: Secondary | ICD-10-CM | POA: Insufficient documentation

## 2021-08-13 DIAGNOSIS — T451X5A Adverse effect of antineoplastic and immunosuppressive drugs, initial encounter: Secondary | ICD-10-CM | POA: Insufficient documentation

## 2021-08-13 DIAGNOSIS — E46 Unspecified protein-calorie malnutrition: Secondary | ICD-10-CM | POA: Diagnosis not present

## 2021-08-13 DIAGNOSIS — E039 Hypothyroidism, unspecified: Secondary | ICD-10-CM | POA: Insufficient documentation

## 2021-08-13 LAB — CBC WITH DIFFERENTIAL (CANCER CENTER ONLY)
Abs Immature Granulocytes: 0.02 10*3/uL (ref 0.00–0.07)
Basophils Absolute: 0 10*3/uL (ref 0.0–0.1)
Basophils Relative: 0 %
Eosinophils Absolute: 0.2 10*3/uL (ref 0.0–0.5)
Eosinophils Relative: 4 %
HCT: 30.4 % — ABNORMAL LOW (ref 36.0–46.0)
Hemoglobin: 10.1 g/dL — ABNORMAL LOW (ref 12.0–15.0)
Immature Granulocytes: 0 %
Lymphocytes Relative: 24 %
Lymphs Abs: 1.1 10*3/uL (ref 0.7–4.0)
MCH: 30.7 pg (ref 26.0–34.0)
MCHC: 33.2 g/dL (ref 30.0–36.0)
MCV: 92.4 fL (ref 80.0–100.0)
Monocytes Absolute: 0.5 10*3/uL (ref 0.1–1.0)
Monocytes Relative: 11 %
Neutro Abs: 2.8 10*3/uL (ref 1.7–7.7)
Neutrophils Relative %: 61 %
Platelet Count: 359 10*3/uL (ref 150–400)
RBC: 3.29 MIL/uL — ABNORMAL LOW (ref 3.87–5.11)
RDW: 15.2 % (ref 11.5–15.5)
WBC Count: 4.6 10*3/uL (ref 4.0–10.5)
nRBC: 0 % (ref 0.0–0.2)

## 2021-08-13 LAB — CMP (CANCER CENTER ONLY)
ALT: 15 U/L (ref 0–44)
AST: 29 U/L (ref 15–41)
Albumin: 3.4 g/dL — ABNORMAL LOW (ref 3.5–5.0)
Alkaline Phosphatase: 91 U/L (ref 38–126)
Anion gap: 8 (ref 5–15)
BUN: 25 mg/dL — ABNORMAL HIGH (ref 6–20)
CO2: 31 mmol/L (ref 22–32)
Calcium: 9.8 mg/dL (ref 8.9–10.3)
Chloride: 97 mmol/L — ABNORMAL LOW (ref 98–111)
Creatinine: 1.24 mg/dL — ABNORMAL HIGH (ref 0.44–1.00)
GFR, Estimated: 52 mL/min — ABNORMAL LOW (ref >60)
Glucose, Bld: 101 mg/dL — ABNORMAL HIGH (ref 70–99)
Potassium: 3.6 mmol/L (ref 3.5–5.1)
Sodium: 136 mmol/L (ref 135–145)
Total Bilirubin: 0.5 mg/dL (ref 0.3–1.2)
Total Protein: 6.2 g/dL — ABNORMAL LOW (ref 6.5–8.1)

## 2021-08-13 MED ORDER — SODIUM CHLORIDE 0.9 % IV SOLN: Freq: Once | INTRAVENOUS | Status: AC

## 2021-08-13 MED ORDER — FLUCONAZOLE 50 MG PO TABS: 50.0000 mg | ORAL_TABLET | Freq: Every day | ORAL | 0 refills | Status: DC

## 2021-08-13 MED ORDER — SODIUM CHLORIDE 0.9% FLUSH
10.0000 mL | INTRAVENOUS | Status: DC | PRN
  Administered 2021-08-13: 10 mL

## 2021-08-13 MED ORDER — PACLITAXEL PROTEIN-BOUND CHEMO INJECTION 100 MG
100.0000 mg/m2 | Freq: Once | INTRAVENOUS | Status: AC
  Administered 2021-08-13: 200 mg via INTRAVENOUS
  Filled 2021-08-13: qty 40

## 2021-08-13 MED ORDER — SODIUM CHLORIDE 0.9 % IV SOLN
1000.0000 mg/m2 | Freq: Once | INTRAVENOUS | Status: AC
  Administered 2021-08-13: 1976 mg via INTRAVENOUS
  Filled 2021-08-13: qty 51.97

## 2021-08-13 MED ORDER — PROCHLORPERAZINE MALEATE 10 MG PO TABS
10.0000 mg | ORAL_TABLET | Freq: Once | ORAL | Status: AC
  Administered 2021-08-13: 10 mg via ORAL
  Filled 2021-08-13: qty 1

## 2021-08-13 MED ORDER — HEPARIN SOD (PORK) LOCK FLUSH 100 UNIT/ML IV SOLN
500.0000 [IU] | Freq: Once | INTRAVENOUS | Status: AC | PRN
  Administered 2021-08-13: 500 [IU]

## 2021-08-13 NOTE — Patient Instructions (Signed)
[facility]  Discharge Instructions: Thank you for choosing [facility] to provide your oncology and hematology care.   If you have a lab appointment with the [facility], please go directly to the [facility] and check in at the registration area.   Wear comfortable clothing and clothing appropriate for easy access to any Portacath or PICC line.   We strive to give you quality time with your provider. You may need to reschedule your appointment if you arrive late (15 or more minutes).  Arriving late affects you and other patients whose appointments are after yours.  Also, if you miss three or more appointments without notifying the office, you may be dismissed from the clinic at the provider's discretion.      For prescription refill requests, have your pharmacy contact our office and allow 72 hours for refills to be completed.    Today you received the following chemotherapy and/or immunotherapy agents: paclitaxel protein bound (Abraxane), gemcitabine (Gemzar).     To help prevent nausea and vomiting after your treatment, we encourage you to take your nausea medication as directed.  BELOW ARE SYMPTOMS THAT SHOULD BE REPORTED IMMEDIATELY: *FEVER GREATER THAN 100.4 F (38 C) OR HIGHER *CHILLS OR SWEATING *NAUSEA AND VOMITING THAT IS NOT CONTROLLED WITH YOUR NAUSEA MEDICATION *UNUSUAL SHORTNESS OF BREATH *UNUSUAL BRUISING OR BLEEDING *URINARY PROBLEMS (pain or burning when urinating, or frequent urination) *BOWEL PROBLEMS (unusual diarrhea, constipation, pain near the anus) TENDERNESS IN MOUTH AND THROAT WITH OR WITHOUT PRESENCE OF ULCERS (sore throat, sores in mouth, or a toothache) UNUSUAL RASH, SWELLING OR PAIN  UNUSUAL VAGINAL DISCHARGE OR ITCHING   Items with * indicate a potential emergency and should be followed up as soon as possible or go to the Emergency Department if any problems should occur.  Please show the CHEMOTHERAPY ALERT  CARD or IMMUNOTHERAPY ALERT CARD at check-in to the Emergency Department and triage nurse.  Should you have questions after your visit or need to cancel or reschedule your appointment, please contact [facility]  Dept: [phone]  and follow the prompts.  Office hours are 8:00 a.m. to 4:30 p.m. Monday - Friday. Please note that voicemails left after 4:00 p.m. may not be returned until the following business day.  We are closed weekends and major holidays. You have access to a nurse at all times for urgent questions. Please call the main number to the clinic Dept: [phone] and follow the prompts.   For any non-urgent questions, you may also contact your provider using MyChart. We now offer e-Visits for anyone 53 and older to request care online for non-urgent symptoms. For details visit [email or web address].   Also download the MyChart app! Go to the app store, search "MyChart", open the app, select [facility], and log in with your MyChart username and password.  Due to Covid, a mask is required upon entering the hospital/clinic. If you do not have a mask, one will be given to you upon arrival. For doctor visits, patients may have 1 support person aged 74 or older with them. For treatment visits, patients cannot have anyone with them due to current Covid guidelines and our immunocompromised population.   Nanoparticle Albumin-Bound Paclitaxel injection What is this medication? NANOPARTICLE ALBUMIN-BOUND PACLITAXEL (Na no PAHR ti kuhl al BYOO muhn-bound PAK li TAX el) is a chemotherapy drug. It targets fast dividing cells, like cancer cells, and causes these cells to die. This medicine is used  to treatadvanced breast cancer, lung cancer, and pancreatic cancer. This medicine may be used for other purposes; ask your health care provider orpharmacist if you have questions. COMMON BRAND NAME(S): Abraxane What should I tell my care team before I take this  medication? They need to know if you have any of these conditions: kidney disease liver disease low blood counts, like low white cell, platelet, or red cell counts lung or breathing disease, like asthma tingling of the fingers or toes, or other nerve disorder an unusual or allergic reaction to paclitaxel, albumin, other chemotherapy, other medicines, foods, dyes, or preservatives pregnant or trying to get pregnant breast-feeding How should I use this medication? This drug is given as an infusion into a vein. It is administered in a hospitalor clinic by a specially trained health care professional. Talk to your pediatrician regarding the use of this medicine in children.Special care may be needed. Overdosage: If you think you have taken too much of this medicine contact apoison control center or emergency room at once. NOTE: This medicine is only for you. Do not share this medicine with others. What if I miss a dose? It is important not to miss your dose. Call your doctor or health careprofessional if you are unable to keep an appointment. What may interact with this medication? This medicine may interact with the following medications: antiviral medicines for hepatitis, HIV or AIDS certain antibiotics like erythromycin and clarithromycin certain medicines for fungal infections like ketoconazole and itraconazole certain medicines for seizures like carbamazepine, phenobarbital, phenytoin gemfibrozil nefazodone rifampin St. John's wort This list may not describe all possible interactions. Give your health care provider a list of all the medicines, herbs, non-prescription drugs, or dietary supplements you use. Also tell them if you smoke, drink alcohol, or use illegaldrugs. Some items may interact with your medicine. What should I watch for while using this medication? Your condition will be monitored carefully while you are receiving this medicine. You will need important blood work done  while you are taking thismedicine. This medicine can cause serious allergic reactions. If you experience allergic reactions like skin rash, itching or hives, swelling of the face, lips, ortongue, tell your doctor or health care professional right away. In some cases, you may be given additional medicines to help with side effects.Follow all directions for their use. This drug may make you feel generally unwell. This is not uncommon, as chemotherapy can affect healthy cells as well as cancer cells. Report any side effects. Continue your course of treatment even though you feel ill unless yourdoctor tells you to stop. Call your doctor or health care professional for advice if you get a fever, chills or sore throat, or other symptoms of a cold or flu. Do not treat yourself. This drug decreases your body's ability to fight infections. Try toavoid being around people who are sick. This medicine may increase your risk to bruise or bleed. Call your doctor orhealth care professional if you notice any unusual bleeding. Be careful brushing and flossing your teeth or using a toothpick because you may get an infection or bleed more easily. If you have any dental work done,tell your dentist you are receiving this medicine. Avoid taking products that contain aspirin, acetaminophen, ibuprofen, naproxen, or ketoprofen unless instructed by your doctor. These medicines may hide afever. Do not become pregnant while taking this medicine or for 6 months after stopping it. Women should inform their doctor if they wish to become pregnant or think they might be pregnant.  Men should not father a child while taking this medicine or for 3 months after stopping it. There is a potential for serious side effects to an unborn child. Talk to your health care professionalor pharmacist for more information. Do not breast-feed an infant while taking this medicine or for 2 weeks afterstopping it. This medicine may interfere with the ability  to get pregnant or to father a child. You should talk to your doctor or health care professional if you areconcerned about your fertility. What side effects may I notice from receiving this medication? Side effects that you should report to your doctor or health care professionalas soon as possible: allergic reactions like skin rash, itching or hives, swelling of the face, lips, or tongue breathing problems changes in vision fast, irregular heartbeat low blood pressure mouth sores pain, tingling, numbness in the hands or feet signs of decreased platelets or bleeding - bruising, pinpoint red spots on the skin, black, tarry stools, blood in the urine signs of decreased red blood cells - unusually weak or tired, feeling faint or lightheaded, falls signs of infection - fever or chills, cough, sore throat, pain or difficulty passing urine signs and symptoms of liver injury like dark yellow or brown urine; general ill feeling or flu-like symptoms; light-colored stools; loss of appetite; nausea; right upper belly pain; unusually weak or tired; yellowing of the eyes or skin swelling of the ankles, feet, hands unusually slow heartbeat Side effects that usually do not require medical attention (report to yourdoctor or health care professional if they continue or are bothersome): diarrhea hair loss loss of appetite nausea, vomiting tiredness This list may not describe all possible side effects. Call your doctor for medical advice about side effects. You may report side effects to FDA at1-800-FDA-1088. Where should I keep my medication? This drug is given in a hospital or clinic and will not be stored at home. NOTE: This sheet is a summary. It may not cover all possible information. If you have questions about this medicine, talk to your doctor, pharmacist, orhealth care provider.  2022 Elsevier/Gold Standard (2017-08-02 13:03:45)  Gemcitabine injection What is this medication? GEMCITABINE (jem  SYE ta been) is a chemotherapy drug. This medicine is used to treat many types of cancer like breast cancer, lung cancer, pancreatic cancer,and ovarian cancer. This medicine may be used for other purposes; ask your health care provider orpharmacist if you have questions. COMMON BRAND NAME(S): Gemzar, Infugem What should I tell my care team before I take this medication? They need to know if you have any of these conditions: blood disorders infection kidney disease liver disease lung or breathing disease, like asthma recent or ongoing radiation therapy an unusual or allergic reaction to gemcitabine, other chemotherapy, other medicines, foods, dyes, or preservatives pregnant or trying to get pregnant breast-feeding How should I use this medication? This drug is given as an infusion into a vein. It is administered in a hospitalor clinic by a specially trained health care professional. Talk to your pediatrician regarding the use of this medicine in children.Special care may be needed. Overdosage: If you think you have taken too much of this medicine contact apoison control center or emergency room at once. NOTE: This medicine is only for you. Do not share this medicine with others. What if I miss a dose? It is important not to miss your dose. Call your doctor or health careprofessional if you are unable to keep an appointment. What may interact with this medication? medicines to increase  blood counts like filgrastim, pegfilgrastim, sargramostim some other chemotherapy drugs like cisplatin vaccines Talk to your doctor or health care professional before taking any of thesemedicines: acetaminophen aspirin ibuprofen ketoprofen naproxen This list may not describe all possible interactions. Give your health care provider a list of all the medicines, herbs, non-prescription drugs, or dietary supplements you use. Also tell them if you smoke, drink alcohol, or use illegaldrugs. Some items may  interact with your medicine. What should I watch for while using this medication? Visit your doctor for checks on your progress. This drug may make you feel generally unwell. This is not uncommon, as chemotherapy can affect healthy cells as well as cancer cells. Report any side effects. Continue your course oftreatment even though you feel ill unless your doctor tells you to stop. In some cases, you may be given additional medicines to help with side effects.Follow all directions for their use. Call your doctor or health care professional for advice if you get a fever, chills or sore throat, or other symptoms of a cold or flu. Do not treat yourself. This drug decreases your body's ability to fight infections. Try toavoid being around people who are sick. This medicine may increase your risk to bruise or bleed. Call your doctor orhealth care professional if you notice any unusual bleeding. Be careful brushing and flossing your teeth or using a toothpick because you may get an infection or bleed more easily. If you have any dental work done,tell your dentist you are receiving this medicine. Avoid taking products that contain aspirin, acetaminophen, ibuprofen, naproxen, or ketoprofen unless instructed by your doctor. These medicines may hide afever. Do not become pregnant while taking this medicine or for 6 months after stopping it. Women should inform their doctor if they wish to become pregnant or think they might be pregnant. Men should not father a child while taking this medicine and for 3 months after stopping it. There is a potential for serious side effects to an unborn child. Talk to your health care professional or pharmacist for more information. Do not breast-feed an infant while takingthis medicine or for at least 1 week after stopping it. Men should inform their doctors if they wish to father a child. This medicine may lower sperm counts. Talk with your doctor or health care professional ifyou  are concerned about your fertility. What side effects may I notice from receiving this medication? Side effects that you should report to your doctor or health care professionalas soon as possible: allergic reactions like skin rash, itching or hives, swelling of the face, lips, or tongue breathing problems pain, redness, or irritation at site where injected signs and symptoms of a dangerous change in heartbeat or heart rhythm like chest pain; dizziness; fast or irregular heartbeat; palpitations; feeling faint or lightheaded, falls; breathing problems signs of decreased platelets or bleeding - bruising, pinpoint red spots on the skin, black, tarry stools, blood in the urine signs of decreased red blood cells - unusually weak or tired, feeling faint or lightheaded, falls signs of infection - fever or chills, cough, sore throat, pain or difficulty passing urine signs and symptoms of kidney injury like trouble passing urine or change in the amount of urine signs and symptoms of liver injury like dark yellow or brown urine; general ill feeling or flu-like symptoms; light-colored stools; loss of appetite; nausea; right upper belly pain; unusually weak or tired; yellowing of the eyes or skin swelling of ankles, feet, hands Side effects that usually do not  require medical attention (report to yourdoctor or health care professional if they continue or are bothersome): constipation diarrhea hair loss loss of appetite nausea rash vomiting This list may not describe all possible side effects. Call your doctor for medical advice about side effects. You may report side effects to FDA at1-800-FDA-1088. Where should I keep my medication? This drug is given in a hospital or clinic and will not be stored at home. NOTE: This sheet is a summary. It may not cover all possible information. If you have questions about this medicine, talk to your doctor, pharmacist, orhealth care provider.  2022 Elsevier/Gold  Standard (2018-02-22 18:06:11)

## 2021-08-13 NOTE — Progress Notes (Signed)
[facility] OFFICE PROGRESS NOTE   Diagnosis: Pancreas cancer  INTERVAL HISTORY:   Ms. Archbold completed a cycle of gemcitabine/Abraxane on 07/30/2021.  No fever, rash, or neuropathy symptoms.  She reports improvement in nausea.  She is now tolerating a regular diet.  She continues to have abdominal pain.  Her pharmacy has not been able to fill the Roxanol prescription.  She continues MS Contin.  She is no longer using the gastrostomy tube for feeding.  She would like the tube removed.  Objective:  Vital signs in last 24 hours:  Blood pressure 98/69, pulse (!) 102, temperature 98.1 F (36.7 C), temperature source Oral, resp. rate 20, height '5\' 4"'  (1.626 m), weight 193 lb 6.4 oz (87.7 kg), SpO2 99 %.    HEENT: Thrush at the buccal mucosa, palate and tongue Resp: Lungs clear bilaterally Cardio: Regular rate and rhythm GI: No mass, tender in the right upper abdomen, left upper quadrant gastrostomy tube site without evidence of infection Vascular: No leg edema   Portacath/PICC-without erythema  Lab Results:  Lab Results  Component Value Date   WBC 4.6 08/13/2021   HGB 10.1 (L) 08/13/2021   HCT 30.4 (L) 08/13/2021   MCV 92.4 08/13/2021   PLT 359 08/13/2021   NEUTROABS 2.8 08/13/2021    CMP  Lab Results  Component Value Date   NA 136 08/13/2021   K 3.6 08/13/2021   CL 97 (L) 08/13/2021   CO2 31 08/13/2021   GLUCOSE 101 (H) 08/13/2021   BUN 25 (H) 08/13/2021   CREATININE 1.24 (H) 08/13/2021   CALCIUM 9.8 08/13/2021   PROT 6.2 (L) 08/13/2021   ALBUMIN 3.4 (L) 08/13/2021   AST 29 08/13/2021   ALT 15 08/13/2021   ALKPHOS 91 08/13/2021   BILITOT 0.5 08/13/2021   GFRNONAA 52 (L) 08/13/2021   GFRAA >60 07/21/2020    Lab Results  Component Value Date   CEA1 68.8 (H) 02/23/2021   CEA <0.5 10/15/2008   DZH299 11,746 (H) 07/30/2021     Medications: I have reviewed the patient's current medications.   Assessment/Plan: Pancreas cancer -CT  abdomen/pelvis without contrast 02/16/2021-multiple large hypoechoic masses in the patient's liver consistent metastatic disease, ill-defined masslike area in the pancreatic head measuring approximate 4.7 cm, possible filling defect in the right ventricle. -EGD 02/19/2021-large infiltrative mass with bleeding in the second portion of the duodenum, appears to be arising near the ampulla, partially obstructive with friable mucosa.  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 -Flexible sigmoidoscopy 02/19/2021-diverticulosis in the sigmoid colon, nonbleeding external and internal hemorrhoids -Cardiac CT 02/20/2021-mass in the mid RV attached to the interventricular septum measuring 16 mm x 6 mm and concerning for metastatic tumor -MRI abdomen with/without contrast 02/21/2021-mass in the posterior pancreatic head measuring 4.3 x 3.8 cm with obstruction of the central pancreatic duct and diffuse dilatation up to 6 mm consistent with pancreatic adenocarcinoma, liver lesions the largest mass of the central left lobe measuring 9.9 x 9.2 cm consistent with hepatic metastatic disease,  hepatomegaly. -Ultrasound-guided biopsy of a left liver lesion 02/24/2021-adenocarcinoma, morphologically similar to the duodenal biopsy; microsatellite stable, tumor mutation burden 1 -Negative genetic testing -Palliative radiation to the duodenal mass 02/25/2021-03/06/2021 -Cycle 1 FOLFOX 03/17/2021 -Cycle 2 FOLFOX 04/01/2021 -Cycle 3 FOLFIRINOX 04/14/2021 -Cycle 4 FOLFIRINOX 04/27/2021, Udenyca added -Cycle 5 FOLFIRINOX 05/12/2021, Udenyca -MRI abdomen 05/23/2021-decrease in size of pancreas mass and hepatic lesions, no progressive disease -Cycle 6 FOLFIRINOX 05/27/2021 -CT 06/17/2021-multiple liver metastases similar to her prior exam.  Pancreas mass not significantly changed. -CT 06/26/2021-stable pancreas mass, no significant change in size and number of known liver metastases. -CT abdomen/pelvis 07/12/2021-new  gastrostomy tube.  Multiple liver masses slightly decreased in size from prior exam.  Pancreas head mass unchanged. -Cycle 1 gemcitabine/Abraxane 07/30/2021 -Cycle 2 gemcitabine/Abraxane 08/13/2021 2.  Iron deficiency anemia-improved -Feraheme 510 mg 02/20/2021 3.  Protein calorie malnutrition 4.  Possible right ventricular filling defect on noncontrast CT scan MRI cardiac morphology 02/21/2021-mass in the mid RV attached to the interventricular septum measures 16 mm x 6 mm.  Mass appears heterogeneous with area of enhancement on LGE imaging.  Mass is concerning for metastatic tumor.  Normal LV size and systolic function.  Normal RV size and systolic function. 5.  Hypertension 6.  History of CVA 7.  Hyperlipidemia 8.  Hypothyroidism 9.  Morbid obesity 10.  Asthma 11.  Migraines 12.  Pain secondary to #1 13.  Port-A-Cath placement 02/25/2021 14.  Hypokalemia 04/01/2021-started a potassium supplement 15.  Hospital admission 06/17/2021-intractable nausea and vomiting EGD 06/19/2021-diffuse gastritis, nonobstructing duodenal mass, biopsy of stomach-no malignancy or H. pylori, duodenal mass-adenocarcinoma 16.  Hospital admission 07/12/2021 with nausea/vomiting, abdominal pain, and hypokalemia 17.  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  She tolerated the first cycle of gemcitabine/Abraxane well.  She will complete cycle 2 today.  She is now tolerating a diet by mouth and has gained weight.  She would like to have the feeding tube removed.  We discussed leaving the tube in place for a few more weeks, but she insists on having the tube removed now.  She will continue MS Contin.  We will contact the pharmacy to get the Roxanol filled.  Ms. Favorite will return for an office visit and chemotherapy in 2 weeks.  She will take a course of Diflucan for oral candidiasis.  Betsy Coder, MD  08/13/2021  11:58 AM

## 2021-08-13 NOTE — Patient Instructions (Signed)
Implanted Port Home Guide An implanted port is a device that is placed under the skin. It is usually placed in the chest. The device can be used to give IV medicine, to take blood, or for dialysis. You may have an implanted port if: You need IV medicine that would be irritating to the small veins in your hands or arms. You need IV medicines, such as antibiotics, for a long period of time. You need IV nutrition for a long period of time. You need dialysis. When you have a port, your health care provider can choose to use the port instead of veins in your arms for these procedures. You may have fewer limitations when using a port than you would if you used other types of long-term IVs, and you will likely be able to return to normal activities after your incision heals. An implanted port has two main parts: Reservoir. The reservoir is the part where a needle is inserted to give medicines or draw blood. The reservoir is round. After it is placed, it appears as a small, raised area under your skin. Catheter. The catheter is a thin, flexible tube that connects the reservoir to a vein. Medicine that is inserted into the reservoir goes into the catheter and then into the vein. How is my port accessed? To access your port: A numbing cream may be placed on the skin over the port site. Your health care provider will put on a mask and sterile gloves. The skin over your port will be cleaned carefully with a germ-killing soap and allowed to dry. Your health care provider will gently pinch the port and insert a needle into it. Your health care provider will check for a blood return to make sure the port is in the vein and is not clogged. If your port needs to remain accessed to get medicine continuously (constant infusion), your health care provider will place a clear bandage (dressing) over the needle site. The dressing and needle will need to be changed every week, or as told by your health care provider. What  is flushing? Flushing helps keep the port from getting clogged. Follow instructions from your health care provider about how and when to flush the port. Ports are usually flushed with saline solution or a medicine called heparin. The need for flushing will depend on how the port is used: If the port is only used from time to time to give medicines or draw blood, the port may need to be flushed: Before and after medicines have been given. Before and after blood has been drawn. As part of routine maintenance. Flushing may be recommended every 4-6 weeks. If a constant infusion is running, the port may not need to be flushed. Throw away any syringes in a disposal container that is meant for sharp items (sharps container). You can buy a sharps container from a pharmacy, or you can make one by using an empty hard plastic bottle with a cover. How long will my port stay implanted? The port can stay in for as long as your health care provider thinks it is needed. When it is time for the port to come out, a surgery will be done to remove it. The surgery will be similar to the procedure that was done to put the port in. Follow these instructions at home:  Flush your port as told by your health care provider. If you need an infusion over several days, follow instructions from your health care provider about how   to take care of your port site. Make sure you: Wash your hands with soap and water before you change your dressing. If soap and water are not available, use alcohol-based hand sanitizer. Change your dressing as told by your health care provider. Place any used dressings or infusion bags into a plastic bag. Throw that bag in the trash. Keep the dressing that covers the needle clean and dry. Do not get it wet. Do not use scissors or sharp objects near the tube. Keep the tube clamped, unless it is being used. Check your port site every day for signs of infection. Check for: Redness, swelling, or  pain. Fluid or blood. Pus or a bad smell. Protect the skin around the port site. Avoid wearing bra straps that rub or irritate the site. Protect the skin around your port from seat belts. Place a soft pad over your chest if needed. Bathe or shower as told by your health care provider. The site may get wet as long as you are not actively receiving an infusion. Return to your normal activities as told by your health care provider. Ask your health care provider what activities are safe for you. Carry a medical alert card or wear a medical alert bracelet at all times. This will let health care providers know that you have an implanted port in case of an emergency. Get help right away if: You have redness, swelling, or pain at the port site. You have fluid or blood coming from your port site. You have pus or a bad smell coming from the port site. You have a fever. Summary Implanted ports are usually placed in the chest for long-term IV access. Follow instructions from your health care provider about flushing the port and changing bandages (dressings). Take care of the area around your port by avoiding clothing that puts pressure on the area, and by watching for signs of infection. Protect the skin around your port from seat belts. Place a soft pad over your chest if needed. Get help right away if you have a fever or you have redness, swelling, pain, drainage, or a bad smell at the port site. This information is not intended to replace advice given to you by your health care provider. Make sure you discuss any questions you have with your health care provider. Document Revised: 02/18/2021 Document Reviewed: 04/14/2020 Elsevier Patient Education  2022 Elsevier Inc.  

## 2021-08-14 ENCOUNTER — Telehealth: Payer: Self-pay | Admitting: *Deleted

## 2021-08-14 NOTE — Telephone Encounter (Signed)
Morphine sulfate concentrate approved by [employer]. Notified pharmacy and patient.

## 2021-08-18 ENCOUNTER — Encounter: Payer: Self-pay | Admitting: *Deleted

## 2021-08-18 NOTE — Progress Notes (Signed)
Received fax from [facility] requesting a new script for the MS concentrate that was sent in on 07/21/21. Spoke w/pharmacy and they sent this request to confirm she still needs it since so much time has passed. Confirmed that yes, she has needed it for quite some time now. Pharmacy will fill script for her today.

## 2021-08-19 ENCOUNTER — Telehealth: Payer: Self-pay | Admitting: Nutrition

## 2021-08-19 ENCOUNTER — Inpatient Hospital Stay: Payer: Medicaid Other | Admitting: Nutrition

## 2021-08-19 NOTE — Telephone Encounter (Signed)
Contacted patient by telephone for nutrition follow up. Patient reports things are going well. She has a good appetite and is eating a regular diet. Weight improved to 193.4 pounds from 186.6 pounds  August 24. Reports MD has ordered feeding tube removal. She has no questions or concerns at this time and agrees to contact RD with questions or concerns.Nutrition diagnosis resolved.

## 2021-08-21 ENCOUNTER — Ambulatory Visit (HOSPITAL_COMMUNITY)
Admission: RE | Admit: 2021-08-21 | Discharge: 2021-08-21 | Disposition: A | Discharge status: Home / Self Care | Payer: Medicaid Other | Source: Ambulatory Visit | Attending: Oncology | Admitting: Oncology

## 2021-08-21 ENCOUNTER — Other Ambulatory Visit: Payer: Self-pay

## 2021-08-21 DIAGNOSIS — Z431 Encounter for attention to gastrostomy: Secondary | ICD-10-CM | POA: Diagnosis not present

## 2021-08-21 DIAGNOSIS — C250 Malignant neoplasm of head of pancreas: Secondary | ICD-10-CM | POA: Diagnosis not present

## 2021-08-21 HISTORY — PX: IR GASTROSTOMY TUBE REMOVAL: IMG5492

## 2021-08-21 MED ORDER — LIDOCAINE VISCOUS HCL 2 % MT SOLN
OROMUCOSAL | Status: DC | PRN
  Administered 2021-08-21: 15 mL via OROMUCOSAL

## 2021-08-21 MED ORDER — LIDOCAINE VISCOUS HCL 2 % MT SOLN
OROMUCOSAL | Status: AC
  Filled 2021-08-21: qty 15

## 2021-08-22 ENCOUNTER — Other Ambulatory Visit: Payer: Self-pay | Admitting: Oncology

## 2021-08-26 ENCOUNTER — Other Ambulatory Visit: Payer: Self-pay | Admitting: Nurse Practitioner

## 2021-08-26 DIAGNOSIS — C250 Malignant neoplasm of head of pancreas: Secondary | ICD-10-CM

## 2021-08-27 ENCOUNTER — Encounter: Payer: Self-pay | Admitting: Nurse Practitioner

## 2021-08-27 ENCOUNTER — Inpatient Hospital Stay (HOSPITAL_BASED_OUTPATIENT_CLINIC_OR_DEPARTMENT_OTHER): Payer: Medicaid Other | Admitting: Nurse Practitioner

## 2021-08-27 ENCOUNTER — Other Ambulatory Visit: Payer: Self-pay

## 2021-08-27 ENCOUNTER — Inpatient Hospital Stay: Payer: Medicaid Other

## 2021-08-27 VITALS — BP 96/75 | HR 97 | Temp 98.1°F | Resp 19 | Ht 64.0 in | Wt 184.2 lb

## 2021-08-27 DIAGNOSIS — C250 Malignant neoplasm of head of pancreas: Secondary | ICD-10-CM

## 2021-08-27 DIAGNOSIS — Z5111 Encounter for antineoplastic chemotherapy: Secondary | ICD-10-CM | POA: Diagnosis not present

## 2021-08-27 DIAGNOSIS — C259 Malignant neoplasm of pancreas, unspecified: Secondary | ICD-10-CM

## 2021-08-27 LAB — CBC WITH DIFFERENTIAL (CANCER CENTER ONLY)
Abs Immature Granulocytes: 0.02 10*3/uL (ref 0.00–0.07)
Basophils Absolute: 0 10*3/uL (ref 0.0–0.1)
Basophils Relative: 0 %
Eosinophils Absolute: 0.6 10*3/uL — ABNORMAL HIGH (ref 0.0–0.5)
Eosinophils Relative: 10 %
HCT: 32.1 % — ABNORMAL LOW (ref 36.0–46.0)
Hemoglobin: 10.7 g/dL — ABNORMAL LOW (ref 12.0–15.0)
Immature Granulocytes: 0 %
Lymphocytes Relative: 18 %
Lymphs Abs: 1 10*3/uL (ref 0.7–4.0)
MCH: 31.1 pg (ref 26.0–34.0)
MCHC: 33.3 g/dL (ref 30.0–36.0)
MCV: 93.3 fL (ref 80.0–100.0)
Monocytes Absolute: 0.6 10*3/uL (ref 0.1–1.0)
Monocytes Relative: 10 %
Neutro Abs: 3.4 10*3/uL (ref 1.7–7.7)
Neutrophils Relative %: 62 %
Platelet Count: 332 10*3/uL (ref 150–400)
RBC: 3.44 MIL/uL — ABNORMAL LOW (ref 3.87–5.11)
RDW: 15.2 % (ref 11.5–15.5)
WBC Count: 5.6 10*3/uL (ref 4.0–10.5)
nRBC: 0 % (ref 0.0–0.2)

## 2021-08-27 LAB — CMP (CANCER CENTER ONLY)
ALT: 15 U/L (ref 0–44)
AST: 24 U/L (ref 15–41)
Albumin: 3.7 g/dL (ref 3.5–5.0)
Alkaline Phosphatase: 82 U/L (ref 38–126)
Anion gap: 10 (ref 5–15)
BUN: 16 mg/dL (ref 6–20)
CO2: 28 mmol/L (ref 22–32)
Calcium: 9.9 mg/dL (ref 8.9–10.3)
Chloride: 101 mmol/L (ref 98–111)
Creatinine: 0.94 mg/dL (ref 0.44–1.00)
GFR, Estimated: >60 mL/min (ref >60)
Glucose, Bld: 109 mg/dL — ABNORMAL HIGH (ref 70–99)
Potassium: 3.3 mmol/L — ABNORMAL LOW (ref 3.5–5.1)
Sodium: 139 mmol/L (ref 135–145)
Total Bilirubin: 0.4 mg/dL (ref 0.3–1.2)
Total Protein: 6.6 g/dL (ref 6.5–8.1)

## 2021-08-27 MED ORDER — SODIUM CHLORIDE 0.9 % IV SOLN: Freq: Once | INTRAVENOUS | Status: AC

## 2021-08-27 MED ORDER — SODIUM CHLORIDE 0.9 % IV SOLN
1000.0000 mg/m2 | Freq: Once | INTRAVENOUS | Status: AC
  Administered 2021-08-27: 1976 mg via INTRAVENOUS
  Filled 2021-08-27: qty 51.97

## 2021-08-27 MED ORDER — ZOLPIDEM TARTRATE 10 MG PO TABS: 10.0000 mg | ORAL_TABLET | Freq: Every evening | ORAL | 0 refills | Status: DC | PRN

## 2021-08-27 MED ORDER — SODIUM CHLORIDE 0.9% FLUSH
10.0000 mL | INTRAVENOUS | Status: DC | PRN
  Administered 2021-08-27: 10 mL

## 2021-08-27 MED ORDER — FLUCONAZOLE 100 MG PO TABS: 100.0000 mg | ORAL_TABLET | Freq: Every day | ORAL | 0 refills | Status: AC

## 2021-08-27 MED ORDER — HEPARIN SOD (PORK) LOCK FLUSH 100 UNIT/ML IV SOLN
500.0000 [IU] | Freq: Once | INTRAVENOUS | Status: AC | PRN
  Administered 2021-08-27: 500 [IU]

## 2021-08-27 MED ORDER — PROCHLORPERAZINE MALEATE 10 MG PO TABS
10.0000 mg | ORAL_TABLET | Freq: Once | ORAL | Status: AC
  Administered 2021-08-27: 10 mg via ORAL
  Filled 2021-08-27: qty 1

## 2021-08-27 MED ORDER — MORPHINE SULFATE ER 60 MG PO TBCR
60.0000 mg | EXTENDED_RELEASE_TABLET | Freq: Two times a day (BID) | ORAL | 0 refills | Status: DC
Start: 2021-08-27 — End: 2021-11-03

## 2021-08-27 NOTE — Progress Notes (Deleted)
.  exa

## 2021-08-27 NOTE — Progress Notes (Signed)
Patient presents for treatment. RN assessment completed along with the following:  Treatment Conditions (labs/vitals/weight) reviewed - Yes, and within treatment parameters.   Oncology Treatment Attestation completed for current therapy- Yes, on date 07/07/21 Informed consent completed and reflects current therapy/intent - Yes, on date 07/30/21             Provider progress note reviewed - Yes, today's provider note was reviewed. Treatment/Antibody/Supportive plan reviewed - Yes, and per Ned Card, NP abraxane will be held this treatment.  S&H and other orders reviewed - Yes, and there are no additional orders identified. Previous treatment date reviewed - Yes, and the appropriate amount of time has elapsed between treatments. Clinic Hand Off Received from - Ned Card, NP  Patient to proceed with treatment.

## 2021-08-27 NOTE — Patient Instructions (Signed)
Implanted Port Home Guide An implanted port is a device that is placed under the skin. It is usually placed in the chest. The device can be used to give IV medicine, to take blood, or for dialysis. You may have an implanted port if: You need IV medicine that would be irritating to the small veins in your hands or arms. You need IV medicines, such as antibiotics, for a long period of time. You need IV nutrition for a long period of time. You need dialysis. When you have a port, your health care provider can choose to use the port instead of veins in your arms for these procedures. You may have fewer limitations when using a port than you would if you used other types of long-term IVs, and you will likely be able to return to normal activities after your incision heals. An implanted port has two main parts: Reservoir. The reservoir is the part where a needle is inserted to give medicines or draw blood. The reservoir is round. After it is placed, it appears as a small, raised area under your skin. Catheter. The catheter is a thin, flexible tube that connects the reservoir to a vein. Medicine that is inserted into the reservoir goes into the catheter and then into the vein. How is my port accessed? To access your port: A numbing cream may be placed on the skin over the port site. Your health care provider will put on a mask and sterile gloves. The skin over your port will be cleaned carefully with a germ-killing soap and allowed to dry. Your health care provider will gently pinch the port and insert a needle into it. Your health care provider will check for a blood return to make sure the port is in the vein and is not clogged. If your port needs to remain accessed to get medicine continuously (constant infusion), your health care provider will place a clear bandage (dressing) over the needle site. The dressing and needle will need to be changed every week, or as told by your health care provider. What  is flushing? Flushing helps keep the port from getting clogged. Follow instructions from your health care provider about how and when to flush the port. Ports are usually flushed with saline solution or a medicine called heparin. The need for flushing will depend on how the port is used: If the port is only used from time to time to give medicines or draw blood, the port may need to be flushed: Before and after medicines have been given. Before and after blood has been drawn. As part of routine maintenance. Flushing may be recommended every 4-6 weeks. If a constant infusion is running, the port may not need to be flushed. Throw away any syringes in a disposal container that is meant for sharp items (sharps container). You can buy a sharps container from a pharmacy, or you can make one by using an empty hard plastic bottle with a cover. How long will my port stay implanted? The port can stay in for as long as your health care provider thinks it is needed. When it is time for the port to come out, a surgery will be done to remove it. The surgery will be similar to the procedure that was done to put the port in. Follow these instructions at home:  Flush your port as told by your health care provider. If you need an infusion over several days, follow instructions from your health care provider about how   to take care of your port site. Make sure you: Wash your hands with soap and water before you change your dressing. If soap and water are not available, use alcohol-based hand sanitizer. Change your dressing as told by your health care provider. Place any used dressings or infusion bags into a plastic bag. Throw that bag in the trash. Keep the dressing that covers the needle clean and dry. Do not get it wet. Do not use scissors or sharp objects near the tube. Keep the tube clamped, unless it is being used. Check your port site every day for signs of infection. Check for: Redness, swelling, or  pain. Fluid or blood. Pus or a bad smell. Protect the skin around the port site. Avoid wearing bra straps that rub or irritate the site. Protect the skin around your port from seat belts. Place a soft pad over your chest if needed. Bathe or shower as told by your health care provider. The site may get wet as long as you are not actively receiving an infusion. Return to your normal activities as told by your health care provider. Ask your health care provider what activities are safe for you. Carry a medical alert card or wear a medical alert bracelet at all times. This will let health care providers know that you have an implanted port in case of an emergency. Get help right away if: You have redness, swelling, or pain at the port site. You have fluid or blood coming from your port site. You have pus or a bad smell coming from the port site. You have a fever. Summary Implanted ports are usually placed in the chest for long-term IV access. Follow instructions from your health care provider about flushing the port and changing bandages (dressings). Take care of the area around your port by avoiding clothing that puts pressure on the area, and by watching for signs of infection. Protect the skin around your port from seat belts. Place a soft pad over your chest if needed. Get help right away if you have a fever or you have redness, swelling, pain, drainage, or a bad smell at the port site. This information is not intended to replace advice given to you by your health care provider. Make sure you discuss any questions you have with your health care provider. Document Revised: 02/18/2021 Document Reviewed: 04/14/2020 Elsevier Patient Education  2022 Elsevier Inc.  

## 2021-08-27 NOTE — Patient Instructions (Signed)
[facility]  Discharge Instructions: Thank you for choosing [facility] to provide your oncology and hematology care.   If you have a lab appointment with the [facility], please go directly to the [facility] and check in at the registration area.   Wear comfortable clothing and clothing appropriate for easy access to any Portacath or PICC line.   We strive to give you quality time with your provider. You may need to reschedule your appointment if you arrive late (15 or more minutes).  Arriving late affects you and other patients whose appointments are after yours.  Also, if you miss three or more appointments without notifying the office, you may be dismissed from the clinic at the provider's discretion.      For prescription refill requests, have your pharmacy contact our office and allow 72 hours for refills to be completed.    Today you received the following chemotherapy and/or immunotherapy agents: gemcitabine (Gemzar).     To help prevent nausea and vomiting after your treatment, we encourage you to take your nausea medication as directed.  BELOW ARE SYMPTOMS THAT SHOULD BE REPORTED IMMEDIATELY: *FEVER GREATER THAN 100.4 F (38 C) OR HIGHER *CHILLS OR SWEATING *NAUSEA AND VOMITING THAT IS NOT CONTROLLED WITH YOUR NAUSEA MEDICATION *UNUSUAL SHORTNESS OF BREATH *UNUSUAL BRUISING OR BLEEDING *URINARY PROBLEMS (pain or burning when urinating, or frequent urination) *BOWEL PROBLEMS (unusual diarrhea, constipation, pain near the anus) TENDERNESS IN MOUTH AND THROAT WITH OR WITHOUT PRESENCE OF ULCERS (sore throat, sores in mouth, or a toothache) UNUSUAL RASH, SWELLING OR PAIN  UNUSUAL VAGINAL DISCHARGE OR ITCHING   Items with * indicate a potential emergency and should be followed up as soon as possible or go to the Emergency Department if any problems should occur.  Please show the CHEMOTHERAPY ALERT CARD or IMMUNOTHERAPY ALERT CARD at  check-in to the Emergency Department and triage nurse.  Should you have questions after your visit or need to cancel or reschedule your appointment, please contact [facility]  Dept: [phone]  and follow the prompts.  Office hours are 8:00 a.m. to 4:30 p.m. Monday - Friday. Please note that voicemails left after 4:00 p.m. may not be returned until the following business day.  We are closed weekends and major holidays. You have access to a nurse at all times for urgent questions. Please call the main number to the clinic Dept: [phone] and follow the prompts.   For any non-urgent questions, you may also contact your provider using MyChart. We now offer e-Visits for anyone 61 and older to request care online for non-urgent symptoms. For details visit [email or web address].   Also download the MyChart app! Go to the app store, search "MyChart", open the app, select [facility], and log in with your MyChart username and password.  Due to Covid, a mask is required upon entering the hospital/clinic. If you do not have a mask, one will be given to you upon arrival. For doctor visits, patients may have 1 support person aged 45 or older with them. For treatment visits, patients cannot have anyone with them due to current Covid guidelines and our immunocompromised population.   Gemcitabine injection What is this medication? GEMCITABINE (jem SYE ta been) is a chemotherapy drug. This medicine is used to treat many types of cancer like breast cancer, lung cancer, pancreatic cancer,and ovarian cancer. This medicine may be used for other purposes; ask your health care provider orpharmacist if  you have questions. COMMON BRAND NAME(S): Gemzar, Infugem What should I tell my care team before I take this medication? They need to know if you have any of these conditions: blood disorders infection kidney disease liver disease lung or breathing disease, like asthma recent or  ongoing radiation therapy an unusual or allergic reaction to gemcitabine, other chemotherapy, other medicines, foods, dyes, or preservatives pregnant or trying to get pregnant breast-feeding How should I use this medication? This drug is given as an infusion into a vein. It is administered in a hospitalor clinic by a specially trained health care professional. Talk to your pediatrician regarding the use of this medicine in children.Special care may be needed. Overdosage: If you think you have taken too much of this medicine contact apoison control center or emergency room at once. NOTE: This medicine is only for you. Do not share this medicine with others. What if I miss a dose? It is important not to miss your dose. Call your doctor or health careprofessional if you are unable to keep an appointment. What may interact with this medication? medicines to increase blood counts like filgrastim, pegfilgrastim, sargramostim some other chemotherapy drugs like cisplatin vaccines Talk to your doctor or health care professional before taking any of thesemedicines: acetaminophen aspirin ibuprofen ketoprofen naproxen This list may not describe all possible interactions. Give your health care provider a list of all the medicines, herbs, non-prescription drugs, or dietary supplements you use. Also tell them if you smoke, drink alcohol, or use illegaldrugs. Some items may interact with your medicine. What should I watch for while using this medication? Visit your doctor for checks on your progress. This drug may make you feel generally unwell. This is not uncommon, as chemotherapy can affect healthy cells as well as cancer cells. Report any side effects. Continue your course oftreatment even though you feel ill unless your doctor tells you to stop. In some cases, you may be given additional medicines to help with side effects.Follow all directions for their use. Call your doctor or health care  professional for advice if you get a fever, chills or sore throat, or other symptoms of a cold or flu. Do not treat yourself. This drug decreases your body's ability to fight infections. Try toavoid being around people who are sick. This medicine may increase your risk to bruise or bleed. Call your doctor orhealth care professional if you notice any unusual bleeding. Be careful brushing and flossing your teeth or using a toothpick because you may get an infection or bleed more easily. If you have any dental work done,tell your dentist you are receiving this medicine. Avoid taking products that contain aspirin, acetaminophen, ibuprofen, naproxen, or ketoprofen unless instructed by your doctor. These medicines may hide afever. Do not become pregnant while taking this medicine or for 6 months after stopping it. Women should inform their doctor if they wish to become pregnant or think they might be pregnant. Men should not father a child while taking this medicine and for 3 months after stopping it. There is a potential for serious side effects to an unborn child. Talk to your health care professional or pharmacist for more information. Do not breast-feed an infant while takingthis medicine or for at least 1 week after stopping it. Men should inform their doctors if they wish to father a child. This medicine may lower sperm counts. Talk with your doctor or health care professional ifyou are concerned about your fertility. What side effects may I notice from receiving  this medication? Side effects that you should report to your doctor or health care professionalas soon as possible: allergic reactions like skin rash, itching or hives, swelling of the face, lips, or tongue breathing problems pain, redness, or irritation at site where injected signs and symptoms of a dangerous change in heartbeat or heart rhythm like chest pain; dizziness; fast or irregular heartbeat; palpitations; feeling faint or lightheaded,  falls; breathing problems signs of decreased platelets or bleeding - bruising, pinpoint red spots on the skin, black, tarry stools, blood in the urine signs of decreased red blood cells - unusually weak or tired, feeling faint or lightheaded, falls signs of infection - fever or chills, cough, sore throat, pain or difficulty passing urine signs and symptoms of kidney injury like trouble passing urine or change in the amount of urine signs and symptoms of liver injury like dark yellow or brown urine; general ill feeling or flu-like symptoms; light-colored stools; loss of appetite; nausea; right upper belly pain; unusually weak or tired; yellowing of the eyes or skin swelling of ankles, feet, hands Side effects that usually do not require medical attention (report to yourdoctor or health care professional if they continue or are bothersome): constipation diarrhea hair loss loss of appetite nausea rash vomiting This list may not describe all possible side effects. Call your doctor for medical advice about side effects. You may report side effects to FDA at1-800-FDA-1088. Where should I keep my medication? This drug is given in a hospital or clinic and will not be stored at home. NOTE: This sheet is a summary. It may not cover all possible information. If you have questions about this medicine, talk to your doctor, pharmacist, orhealth care provider.  2022 Elsevier/Gold Standard (2018-02-22 18:06:11)

## 2021-08-27 NOTE — Progress Notes (Signed)
[facility] OFFICE PROGRESS NOTE   Diagnosis: Pancreas cancer   INTERVAL HISTORY:   Wendy Hoffman returns as scheduled.  She completed cycle 2 gemcitabine/Abraxane 08/13/2021.  The gastrostomy tube was removed 08/21/2021.  She reports a good appetite and good fluid intake.  She has periodic nausea.  No vomiting.  No diarrhea.  She has been constipated.  She utilized a suppository with good results.  She reports having a yeast infection in her mouth.  Numbness/tingling in the feet.  Toes painful.  Objective:  Vital signs in last 24 hours:  Blood pressure 96/75, pulse 97, temperature 98.1 F (36.7 C), temperature source Oral, resp. rate 19, height _0  (1.626 m), weight 184 lb 3.2 oz (83.6 kg), SpO2 99 %.    HEENT: Marked oral candidiasis. Resp: Lungs clear bilaterally. Cardio: Regular rate and rhythm. GI: Abdomen soft.  Tender at the right upper abdomen.  No hepatomegaly. Vascular: No leg edema. Skin: Toes with hyperpigmentation.  Soles dry appearing. Port-A-Cath without erythema.   Lab Results:  Lab Results  Component Value Date   WBC 4.6 08/13/2021   HGB 10.1 (L) 08/13/2021   HCT 30.4 (L) 08/13/2021   MCV 92.4 08/13/2021   PLT 359 08/13/2021   NEUTROABS 2.8 08/13/2021    Imaging:  No results found.  Medications: I have reviewed the patient's current medications.  Assessment/Plan: Pancreas cancer -CT abdomen/pelvis without contrast 02/16/2021-multiple large hypoechoic masses in the patient's liver consistent metastatic disease, ill-defined masslike area in the pancreatic head measuring approximate 4.7 cm, possible filling defect in the right ventricle. -EGD 02/19/2021-large infiltrative mass with bleeding in the second portion of the duodenum, appears to be arising near the ampulla, partially obstructive with friable mucosa.  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 -Flexible sigmoidoscopy 02/19/2021-diverticulosis in the sigmoid  colon, nonbleeding external and internal hemorrhoids -Cardiac CT 02/20/2021-mass in the mid RV attached to the interventricular septum measuring 16 mm x 6 mm and concerning for metastatic tumor -MRI abdomen with/without contrast 02/21/2021-mass in the posterior pancreatic head measuring 4.3 x 3.8 cm with obstruction of the central pancreatic duct and diffuse dilatation up to 6 mm consistent with pancreatic adenocarcinoma, liver lesions the largest mass of the central left lobe measuring 9.9 x 9.2 cm consistent with hepatic metastatic disease,  hepatomegaly. -Ultrasound-guided biopsy of a left liver lesion 02/24/2021-adenocarcinoma, morphologically similar to the duodenal biopsy; microsatellite stable, tumor mutation burden 1 -Negative genetic testing -Palliative radiation to the duodenal mass 02/25/2021-03/06/2021 -Cycle 1 FOLFOX 03/17/2021 -Cycle 2 FOLFOX 04/01/2021 -Cycle 3 FOLFIRINOX 04/14/2021 -Cycle 4 FOLFIRINOX 04/27/2021, Udenyca added -Cycle 5 FOLFIRINOX 05/12/2021, Udenyca -MRI abdomen 05/23/2021-decrease in size of pancreas mass and hepatic lesions, no progressive disease -Cycle 6 FOLFIRINOX 05/27/2021 -CT 06/17/2021-multiple liver metastases similar to her prior exam.  Pancreas mass not significantly changed. -CT 06/26/2021-stable pancreas mass, no significant change in size and number of known liver metastases. -CT abdomen/pelvis 07/12/2021-new gastrostomy tube.  Multiple liver masses slightly decreased in size from prior exam.  Pancreas head mass unchanged. -Cycle 1 gemcitabine/Abraxane 07/30/2021 -Cycle 2 gemcitabine/Abraxane 08/13/2021 -Cycle 3 gemcitabine, Abraxane held due to significant bilateral toe pain, question neuropathy 2.  Iron deficiency anemia-improved -Feraheme 510 mg 02/20/2021 3.  Protein calorie malnutrition 4.  Possible right ventricular filling defect on noncontrast CT scan MRI cardiac morphology 02/21/2021-mass in the mid RV attached to the interventricular septum measures 16 mm x 6  mm.  Mass appears heterogeneous with area of enhancement on LGE imaging.  Mass is concerning for metastatic  tumor.  Normal LV size and systolic function.  Normal RV size and systolic function. 5.  Hypertension 6.  History of CVA 7.  Hyperlipidemia 8.  Hypothyroidism 9.  Morbid obesity 10.  Asthma 11.  Migraines 12.  Pain secondary to #1 13.  Port-A-Cath placement 02/25/2021 14.  Hypokalemia 04/01/2021-started a potassium supplement 15.  Hospital admission 06/17/2021-intractable nausea and vomiting EGD 06/19/2021-diffuse gastritis, nonobstructing duodenal mass, biopsy of stomach-no malignancy or H. pylori, duodenal mass-adenocarcinoma 16.  Hospital admission 07/12/2021 with nausea/vomiting, abdominal pain, and hypokalemia 17.  Percutaneous gastrostomy tube placement 07/01/2021  Disposition: Wendy Hoffman appears unchanged.  She has completed 2 cycles of gemcitabine/Abraxane.  She has numbness/tingling in the feet and today reports significant bilateral toe pain affecting her ability to walk.  Question manifestation of neuropathy.  Abraxane will be held today.  Plan to proceed with Gemcitabine alone.  We reviewed the CBC and chemistry panel from today.  Labs adequate to proceed with treatment.  She has a significant oral yeast infection.  Prescription sent to her pharmacy for Diflucan.  She will return for lab, follow-up, chemotherapy in 2 weeks.  Plan reviewed with Dr. Benay Spice.    [name]   08/27/2021  11:01 AM

## 2021-08-28 LAB — CANCER ANTIGEN 19-9: CA 19-9: 10696 U/mL — ABNORMAL HIGH (ref 0–35)

## 2021-09-06 ENCOUNTER — Other Ambulatory Visit: Payer: Self-pay | Admitting: Oncology

## 2021-09-07 ENCOUNTER — Other Ambulatory Visit: Payer: Self-pay | Admitting: Nurse Practitioner

## 2021-09-07 DIAGNOSIS — C250 Malignant neoplasm of head of pancreas: Secondary | ICD-10-CM

## 2021-09-07 MED ORDER — MORPHINE SULFATE (CONCENTRATE) 10 MG/0.5ML PO SOLN: 10.0000 mg | ORAL | 0 refills | Status: DC | PRN

## 2021-09-10 ENCOUNTER — Ambulatory Visit: Payer: Medicaid Other | Admitting: Nurse Practitioner

## 2021-09-10 ENCOUNTER — Inpatient Hospital Stay: Payer: Medicaid Other

## 2021-09-10 ENCOUNTER — Other Ambulatory Visit: Payer: Self-pay

## 2021-09-10 ENCOUNTER — Telehealth: Payer: Self-pay

## 2021-09-10 ENCOUNTER — Inpatient Hospital Stay (HOSPITAL_BASED_OUTPATIENT_CLINIC_OR_DEPARTMENT_OTHER): Payer: Medicaid Other | Admitting: Oncology

## 2021-09-10 VITALS — BP 101/75 | HR 90 | Temp 98.2°F | Resp 20 | Ht 64.0 in | Wt 184.4 lb

## 2021-09-10 DIAGNOSIS — C250 Malignant neoplasm of head of pancreas: Secondary | ICD-10-CM

## 2021-09-10 DIAGNOSIS — Z5111 Encounter for antineoplastic chemotherapy: Secondary | ICD-10-CM | POA: Diagnosis not present

## 2021-09-10 LAB — CBC WITH DIFFERENTIAL (CANCER CENTER ONLY)
Abs Immature Granulocytes: 0.01 10*3/uL (ref 0.00–0.07)
Basophils Absolute: 0 10*3/uL (ref 0.0–0.1)
Basophils Relative: 0 %
Eosinophils Absolute: 0.4 10*3/uL (ref 0.0–0.5)
Eosinophils Relative: 6 %
HCT: 33.6 % — ABNORMAL LOW (ref 36.0–46.0)
Hemoglobin: 11 g/dL — ABNORMAL LOW (ref 12.0–15.0)
Immature Granulocytes: 0 %
Lymphocytes Relative: 21 %
Lymphs Abs: 1.3 10*3/uL (ref 0.7–4.0)
MCH: 30.7 pg (ref 26.0–34.0)
MCHC: 32.7 g/dL (ref 30.0–36.0)
MCV: 93.9 fL (ref 80.0–100.0)
Monocytes Absolute: 0.5 10*3/uL (ref 0.1–1.0)
Monocytes Relative: 8 %
Neutro Abs: 3.9 10*3/uL (ref 1.7–7.7)
Neutrophils Relative %: 65 %
Platelet Count: 255 10*3/uL (ref 150–400)
RBC: 3.58 MIL/uL — ABNORMAL LOW (ref 3.87–5.11)
RDW: 15.4 % (ref 11.5–15.5)
WBC Count: 6.1 10*3/uL (ref 4.0–10.5)
nRBC: 0 % (ref 0.0–0.2)

## 2021-09-10 LAB — CMP (CANCER CENTER ONLY)
ALT: 27 U/L (ref 0–44)
AST: 34 U/L (ref 15–41)
Albumin: 3.9 g/dL (ref 3.5–5.0)
Alkaline Phosphatase: 85 U/L (ref 38–126)
Anion gap: 10 (ref 5–15)
BUN: 23 mg/dL — ABNORMAL HIGH (ref 6–20)
CO2: 26 mmol/L (ref 22–32)
Calcium: 10.3 mg/dL (ref 8.9–10.3)
Chloride: 103 mmol/L (ref 98–111)
Creatinine: 1.07 mg/dL — ABNORMAL HIGH (ref 0.44–1.00)
GFR, Estimated: >60 mL/min (ref >60)
Glucose, Bld: 103 mg/dL — ABNORMAL HIGH (ref 70–99)
Potassium: 3.9 mmol/L (ref 3.5–5.1)
Sodium: 139 mmol/L (ref 135–145)
Total Bilirubin: 0.4 mg/dL (ref 0.3–1.2)
Total Protein: 6.5 g/dL (ref 6.5–8.1)

## 2021-09-10 MED ORDER — FLUCONAZOLE 100 MG PO TABS: 100.0000 mg | ORAL_TABLET | Freq: Every day | ORAL | 0 refills | Status: DC

## 2021-09-10 MED ORDER — PROCHLORPERAZINE MALEATE 10 MG PO TABS
10.0000 mg | ORAL_TABLET | Freq: Once | ORAL | Status: AC
  Administered 2021-09-10: 10 mg via ORAL
  Filled 2021-09-10: qty 1

## 2021-09-10 MED ORDER — FLUCONAZOLE 50 MG PO TABS: 50.0000 mg | ORAL_TABLET | Freq: Two times a day (BID) | ORAL | 0 refills | Status: DC

## 2021-09-10 MED ORDER — SODIUM CHLORIDE 0.9 % IV SOLN
1000.0000 mg/m2 | Freq: Once | INTRAVENOUS | Status: AC
  Administered 2021-09-10: 1976 mg via INTRAVENOUS
  Filled 2021-09-10: qty 51.97

## 2021-09-10 MED ORDER — HEPARIN SOD (PORK) LOCK FLUSH 100 UNIT/ML IV SOLN
500.0000 [IU] | Freq: Once | INTRAVENOUS | Status: AC | PRN
  Administered 2021-09-10: 500 [IU]

## 2021-09-10 MED ORDER — SODIUM CHLORIDE 0.9% FLUSH
10.0000 mL | INTRAVENOUS | Status: DC | PRN
  Administered 2021-09-10: 10 mL

## 2021-09-10 MED ORDER — SODIUM CHLORIDE 0.9 % IV SOLN: Freq: Once | INTRAVENOUS | Status: AC

## 2021-09-10 NOTE — Patient Instructions (Signed)
[facility]  Discharge Instructions: Thank you for choosing [facility] to provide your oncology and hematology care.   If you have a lab appointment with the [facility], please go directly to the [facility] and check in at the registration area.   Wear comfortable clothing and clothing appropriate for easy access to any Portacath or PICC line.   We strive to give you quality time with your provider. You may need to reschedule your appointment if you arrive late (15 or more minutes).  Arriving late affects you and other patients whose appointments are after yours.  Also, if you miss three or more appointments without notifying the office, you may be dismissed from the clinic at the provider's discretion.      For prescription refill requests, have your pharmacy contact our office and allow 72 hours for refills to be completed.    Today you received the following chemotherapy and/or immunotherapy agents: gemcitabine (Gemzar).     To help prevent nausea and vomiting after your treatment, we encourage you to take your nausea medication as directed.  BELOW ARE SYMPTOMS THAT SHOULD BE REPORTED IMMEDIATELY: *FEVER GREATER THAN 100.4 F (38 C) OR HIGHER *CHILLS OR SWEATING *NAUSEA AND VOMITING THAT IS NOT CONTROLLED WITH YOUR NAUSEA MEDICATION *UNUSUAL SHORTNESS OF BREATH *UNUSUAL BRUISING OR BLEEDING *URINARY PROBLEMS (pain or burning when urinating, or frequent urination) *BOWEL PROBLEMS (unusual diarrhea, constipation, pain near the anus) TENDERNESS IN MOUTH AND THROAT WITH OR WITHOUT PRESENCE OF ULCERS (sore throat, sores in mouth, or a toothache) UNUSUAL RASH, SWELLING OR PAIN  UNUSUAL VAGINAL DISCHARGE OR ITCHING   Items with * indicate a potential emergency and should be followed up as soon as possible or go to the Emergency Department if any problems should occur.  Please show the CHEMOTHERAPY ALERT CARD or IMMUNOTHERAPY ALERT CARD at  check-in to the Emergency Department and triage nurse.  Should you have questions after your visit or need to cancel or reschedule your appointment, please contact [facility]  Dept: [phone]  and follow the prompts.  Office hours are 8:00 a.m. to 4:30 p.m. Monday - Friday. Please note that voicemails left after 4:00 p.m. may not be returned until the following business day.  We are closed weekends and major holidays. You have access to a nurse at all times for urgent questions. Please call the main number to the clinic Dept: [phone] and follow the prompts.   For any non-urgent questions, you may also contact your provider using MyChart. We now offer e-Visits for anyone 61 and older to request care online for non-urgent symptoms. For details visit [email or web address].   Also download the MyChart app! Go to the app store, search "MyChart", open the app, select [facility], and log in with your MyChart username and password.  Due to Covid, a mask is required upon entering the hospital/clinic. If you do not have a mask, one will be given to you upon arrival. For doctor visits, patients may have 1 support person aged 45 or older with them. For treatment visits, patients cannot have anyone with them due to current Covid guidelines and our immunocompromised population.   Gemcitabine injection What is this medication? GEMCITABINE (jem SYE ta been) is a chemotherapy drug. This medicine is used to treat many types of cancer like breast cancer, lung cancer, pancreatic cancer,and ovarian cancer. This medicine may be used for other purposes; ask your health care provider orpharmacist if  you have questions. COMMON BRAND NAME(S): Gemzar, Infugem What should I tell my care team before I take this medication? They need to know if you have any of these conditions: blood disorders infection kidney disease liver disease lung or breathing disease, like asthma recent or  ongoing radiation therapy an unusual or allergic reaction to gemcitabine, other chemotherapy, other medicines, foods, dyes, or preservatives pregnant or trying to get pregnant breast-feeding How should I use this medication? This drug is given as an infusion into a vein. It is administered in a hospitalor clinic by a specially trained health care professional. Talk to your pediatrician regarding the use of this medicine in children.Special care may be needed. Overdosage: If you think you have taken too much of this medicine contact apoison control center or emergency room at once. NOTE: This medicine is only for you. Do not share this medicine with others. What if I miss a dose? It is important not to miss your dose. Call your doctor or health careprofessional if you are unable to keep an appointment. What may interact with this medication? medicines to increase blood counts like filgrastim, pegfilgrastim, sargramostim some other chemotherapy drugs like cisplatin vaccines Talk to your doctor or health care professional before taking any of thesemedicines: acetaminophen aspirin ibuprofen ketoprofen naproxen This list may not describe all possible interactions. Give your health care provider a list of all the medicines, herbs, non-prescription drugs, or dietary supplements you use. Also tell them if you smoke, drink alcohol, or use illegaldrugs. Some items may interact with your medicine. What should I watch for while using this medication? Visit your doctor for checks on your progress. This drug may make you feel generally unwell. This is not uncommon, as chemotherapy can affect healthy cells as well as cancer cells. Report any side effects. Continue your course oftreatment even though you feel ill unless your doctor tells you to stop. In some cases, you may be given additional medicines to help with side effects.Follow all directions for their use. Call your doctor or health care  professional for advice if you get a fever, chills or sore throat, or other symptoms of a cold or flu. Do not treat yourself. This drug decreases your body's ability to fight infections. Try toavoid being around people who are sick. This medicine may increase your risk to bruise or bleed. Call your doctor orhealth care professional if you notice any unusual bleeding. Be careful brushing and flossing your teeth or using a toothpick because you may get an infection or bleed more easily. If you have any dental work done,tell your dentist you are receiving this medicine. Avoid taking products that contain aspirin, acetaminophen, ibuprofen, naproxen, or ketoprofen unless instructed by your doctor. These medicines may hide afever. Do not become pregnant while taking this medicine or for 6 months after stopping it. Women should inform their doctor if they wish to become pregnant or think they might be pregnant. Men should not father a child while taking this medicine and for 3 months after stopping it. There is a potential for serious side effects to an unborn child. Talk to your health care professional or pharmacist for more information. Do not breast-feed an infant while takingthis medicine or for at least 1 week after stopping it. Men should inform their doctors if they wish to father a child. This medicine may lower sperm counts. Talk with your doctor or health care professional ifyou are concerned about your fertility. What side effects may I notice from receiving  this medication? Side effects that you should report to your doctor or health care professionalas soon as possible: allergic reactions like skin rash, itching or hives, swelling of the face, lips, or tongue breathing problems pain, redness, or irritation at site where injected signs and symptoms of a dangerous change in heartbeat or heart rhythm like chest pain; dizziness; fast or irregular heartbeat; palpitations; feeling faint or lightheaded,  falls; breathing problems signs of decreased platelets or bleeding - bruising, pinpoint red spots on the skin, black, tarry stools, blood in the urine signs of decreased red blood cells - unusually weak or tired, feeling faint or lightheaded, falls signs of infection - fever or chills, cough, sore throat, pain or difficulty passing urine signs and symptoms of kidney injury like trouble passing urine or change in the amount of urine signs and symptoms of liver injury like dark yellow or brown urine; general ill feeling or flu-like symptoms; light-colored stools; loss of appetite; nausea; right upper belly pain; unusually weak or tired; yellowing of the eyes or skin swelling of ankles, feet, hands Side effects that usually do not require medical attention (report to yourdoctor or health care professional if they continue or are bothersome): constipation diarrhea hair loss loss of appetite nausea rash vomiting This list may not describe all possible side effects. Call your doctor for medical advice about side effects. You may report side effects to FDA at1-800-FDA-1088. Where should I keep my medication? This drug is given in a hospital or clinic and will not be stored at home. NOTE: This sheet is a summary. It may not cover all possible information. If you have questions about this medicine, talk to your doctor, pharmacist, orhealth care provider.  2022 Elsevier/Gold Standard (2018-02-22 18:06:11)

## 2021-09-10 NOTE — Progress Notes (Signed)
Patient seen by Dr. Benay Spice today  Vitals are within treatment parameters.  Labs reviewed by Dr. Benay Spice CBC diff reviewed and within treatment parameters, CMP pending.  Per physician team, patient is ready for treatment. Please note that modifications are being made to the treatment plan including changes to treatment

## 2021-09-10 NOTE — Patient Instructions (Signed)
Implanted Port Home Guide An implanted port is a device that is placed under the skin. It is usually placed in the chest. The device can be used to give IV medicine, to take blood, or for dialysis. You may have an implanted port if: You need IV medicine that would be irritating to the small veins in your hands or arms. You need IV medicines, such as antibiotics, for a long period of time. You need IV nutrition for a long period of time. You need dialysis. When you have a port, your health care provider can choose to use the port instead of veins in your arms for these procedures. You may have fewer limitations when using a port than you would if you used other types of long-term IVs, and you will likely be able to return to normal activities after your incision heals. An implanted port has two main parts: Reservoir. The reservoir is the part where a needle is inserted to give medicines or draw blood. The reservoir is round. After it is placed, it appears as a small, raised area under your skin. Catheter. The catheter is a thin, flexible tube that connects the reservoir to a vein. Medicine that is inserted into the reservoir goes into the catheter and then into the vein. How is my port accessed? To access your port: A numbing cream may be placed on the skin over the port site. Your health care provider will put on a mask and sterile gloves. The skin over your port will be cleaned carefully with a germ-killing soap and allowed to dry. Your health care provider will gently pinch the port and insert a needle into it. Your health care provider will check for a blood return to make sure the port is in the vein and is not clogged. If your port needs to remain accessed to get medicine continuously (constant infusion), your health care provider will place a clear bandage (dressing) over the needle site. The dressing and needle will need to be changed every week, or as told by your health care provider. What  is flushing? Flushing helps keep the port from getting clogged. Follow instructions from your health care provider about how and when to flush the port. Ports are usually flushed with saline solution or a medicine called heparin. The need for flushing will depend on how the port is used: If the port is only used from time to time to give medicines or draw blood, the port may need to be flushed: Before and after medicines have been given. Before and after blood has been drawn. As part of routine maintenance. Flushing may be recommended every 4-6 weeks. If a constant infusion is running, the port may not need to be flushed. Throw away any syringes in a disposal container that is meant for sharp items (sharps container). You can buy a sharps container from a pharmacy, or you can make one by using an empty hard plastic bottle with a cover. How long will my port stay implanted? The port can stay in for as long as your health care provider thinks it is needed. When it is time for the port to come out, a surgery will be done to remove it. The surgery will be similar to the procedure that was done to put the port in. Follow these instructions at home:  Flush your port as told by your health care provider. If you need an infusion over several days, follow instructions from your health care provider about how   to take care of your port site. Make sure you: Wash your hands with soap and water before you change your dressing. If soap and water are not available, use alcohol-based hand sanitizer. Change your dressing as told by your health care provider. Place any used dressings or infusion bags into a plastic bag. Throw that bag in the trash. Keep the dressing that covers the needle clean and dry. Do not get it wet. Do not use scissors or sharp objects near the tube. Keep the tube clamped, unless it is being used. Check your port site every day for signs of infection. Check for: Redness, swelling, or  pain. Fluid or blood. Pus or a bad smell. Protect the skin around the port site. Avoid wearing bra straps that rub or irritate the site. Protect the skin around your port from seat belts. Place a soft pad over your chest if needed. Bathe or shower as told by your health care provider. The site may get wet as long as you are not actively receiving an infusion. Return to your normal activities as told by your health care provider. Ask your health care provider what activities are safe for you. Carry a medical alert card or wear a medical alert bracelet at all times. This will let health care providers know that you have an implanted port in case of an emergency. Get help right away if: You have redness, swelling, or pain at the port site. You have fluid or blood coming from your port site. You have pus or a bad smell coming from the port site. You have a fever. Summary Implanted ports are usually placed in the chest for long-term IV access. Follow instructions from your health care provider about flushing the port and changing bandages (dressings). Take care of the area around your port by avoiding clothing that puts pressure on the area, and by watching for signs of infection. Protect the skin around your port from seat belts. Place a soft pad over your chest if needed. Get help right away if you have a fever or you have redness, swelling, pain, drainage, or a bad smell at the port site. This information is not intended to replace advice given to you by your health care provider. Make sure you discuss any questions you have with your health care provider. Document Revised: 02/18/2021 Document Reviewed: 04/14/2020 Elsevier Patient Education  2022 Elsevier Inc.  

## 2021-09-10 NOTE — Telephone Encounter (Signed)
error 

## 2021-09-10 NOTE — Progress Notes (Signed)
Patient presents for treatment. RN assessment completed along with the following:  Labs/vitals reviewed - Yes, and within treatment parameters.   Weight within 10% of previous measurement - Yes Oncology Treatment Attestation completed for current therapy- Yes, on date 07/07/21 Informed consent completed and reflects current therapy/intent - Yes, on date 07/30/21             Provider progress note reviewed - Today's provider note is not yet available. I reviewed the most recent oncology provider progress note in chart dated 08/27/21. Treatment/Antibody/Supportive plan reviewed - Yes, and Abraxane has been discontinued. S&H and other orders reviewed - Yes, and there are no additional orders identified. Previous treatment date reviewed - Yes, and the appropriate amount of time has elapsed between treatments.   Patient to proceed with treatment.

## 2021-09-10 NOTE — Progress Notes (Signed)
Wendy Hoffman   Diagnosis: Pancreas cancer  INTERVAL HISTORY:   Wendy Hoffman completed another treatment with gemcitabine on 08/27/2021.  She continues to have numbness and pain in the feet.  She also has numbness in the first and second finger of both hands.  The numbness does not interfere with activities.  Abdominal pain is under control with the current narcotic regimen.  She is eating.  The feeding tube was removed on 08/21/2021.  She is eating and using a boost supplement. She has diffuse pruritus.  No rash or fever. Objective:  Vital signs in last 24 hours:  Blood pressure 101/75, pulse 90, temperature 98.2 F (36.8 C), temperature source Oral, resp. rate 20, height '5\' 4"'  (1.626 m), weight 184 lb 6.4 oz (83.6 kg), SpO2 100 %.    HEENT: Thick white coat over the tongue Resp: Lungs clear bilaterally Cardio: Regular rate and rhythm GI: Tender in the mid and right upper abdomen, no mass Vascular: No leg edema Neuro: Moderate loss of vibratory sense at the second finger bilaterally, mild loss of vibratory sense at other fingers Skin: Palms without erythema, dryness of the soles, no rash  Portacath/PICC-without erythema  Lab Results:  Lab Results  Component Value Date   WBC 5.6 08/27/2021   HGB 10.7 (L) 08/27/2021   HCT 32.1 (L) 08/27/2021   MCV 93.3 08/27/2021   PLT 332 08/27/2021   NEUTROABS 3.4 08/27/2021    CMP  Lab Results  Component Value Date   NA 139 08/27/2021   K 3.3 (L) 08/27/2021   CL 101 08/27/2021   CO2 28 08/27/2021   GLUCOSE 109 (H) 08/27/2021   BUN 16 08/27/2021   CREATININE 0.94 08/27/2021   CALCIUM 9.9 08/27/2021   PROT 6.6 08/27/2021   ALBUMIN 3.7 08/27/2021   AST 24 08/27/2021   ALT 15 08/27/2021   ALKPHOS 82 08/27/2021   BILITOT 0.4 08/27/2021   GFRNONAA >60 08/27/2021   GFRAA >60 07/21/2020    Lab Results  Component Value Date   CEA1 68.8 (H) 02/23/2021   CEA <0.5 10/15/2008   PYK998 10,696 (H)  08/27/2021      Medications: I have reviewed the patient's current medications.   Assessment/Plan: Pancreas cancer -CT abdomen/pelvis without contrast 02/16/2021-multiple large hypoechoic masses in the patient's liver consistent metastatic disease, ill-defined masslike area in the pancreatic head measuring approximate 4.7 cm, possible filling defect in the right ventricle. -EGD 02/19/2021-large infiltrative mass with bleeding in the second portion of the duodenum, appears to be arising near the ampulla, partially obstructive with friable mucosa.  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 -Flexible sigmoidoscopy 02/19/2021-diverticulosis in the sigmoid colon, nonbleeding external and internal hemorrhoids -Cardiac CT 02/20/2021-mass in the mid RV attached to the interventricular septum measuring 16 mm x 6 mm and concerning for metastatic tumor -MRI abdomen with/without contrast 02/21/2021-mass in the posterior pancreatic head measuring 4.3 x 3.8 cm with obstruction of the central pancreatic duct and diffuse dilatation up to 6 mm consistent with pancreatic adenocarcinoma, liver lesions the largest mass of the central left lobe measuring 9.9 x 9.2 cm consistent with hepatic metastatic disease,  hepatomegaly. -Ultrasound-guided biopsy of a left liver lesion 02/24/2021-adenocarcinoma, morphologically similar to the duodenal biopsy; microsatellite stable, tumor mutation burden 1 -Negative genetic testing -Palliative radiation to the duodenal mass 02/25/2021-03/06/2021 -Cycle 1 FOLFOX 03/17/2021 -Cycle 2 FOLFOX 04/01/2021 -Cycle 3 FOLFIRINOX 04/14/2021 -Cycle 4 FOLFIRINOX 04/27/2021, Udenyca added -Cycle 5 FOLFIRINOX 05/12/2021, Udenyca -MRI abdomen 05/23/2021-decrease in size  of pancreas mass and hepatic lesions, no progressive disease -Cycle 6 FOLFIRINOX 05/27/2021 -CT 06/17/2021-multiple liver metastases similar to her prior exam.  Pancreas mass not significantly changed. -CT  06/26/2021-stable pancreas mass, no significant change in size and number of known liver metastases. -CT abdomen/pelvis 07/12/2021-new gastrostomy tube.  Multiple liver masses slightly decreased in size from prior exam.  Pancreas head mass unchanged. -Cycle 1 gemcitabine/Abraxane 07/30/2021 -Cycle 2 gemcitabine/Abraxane 08/13/2021 -Cycle 3 gemcitabine 08/27/2021, Abraxane held due to significant bilateral toe pain, question neuropathy -Cycle 4 gemcitabine, Abraxane held due to neuropathy, 09/10/2021 2.  Iron deficiency anemia-improved -Feraheme 510 mg 02/20/2021 3.  Protein calorie malnutrition 4.  Possible right ventricular filling defect on noncontrast CT scan MRI cardiac morphology 02/21/2021-mass in the mid RV attached to the interventricular septum measures 16 mm x 6 mm.  Mass appears heterogeneous with area of enhancement on LGE imaging.  Mass is concerning for metastatic tumor.  Normal LV size and systolic function.  Normal RV size and systolic function. 5.  Hypertension 6.  History of CVA 7.  Hyperlipidemia 8.  Hypothyroidism 9.  Morbid obesity 10.  Asthma 11.  Migraines 12.  Pain secondary to #1 13.  Port-A-Cath placement 02/25/2021 14.  Hypokalemia 04/01/2021-started a potassium supplement 15.  Hospital admission 06/17/2021-intractable nausea and vomiting EGD 06/19/2021-diffuse gastritis, nonobstructing duodenal mass, biopsy of stomach-no malignancy or H. pylori, duodenal mass-adenocarcinoma 16.  Hospital admission 07/12/2021 with nausea/vomiting, abdominal pain, and hypokalemia 17.  Percutaneous gastrostomy tube placement 07/01/2021    Disposition: Wendy Hoffman appears stable.  She has developed increased neuropathy symptoms over the past month.  It is unclear whether the neuropathy is related to previous treatment with oxaliplatin or Abraxane.  We decided to hold Abraxane today.  We will reassess the neuropathy when she is here in 2 weeks.  She will continue the current narcotic pain  regimen.  We will follow-up on the CA 19-9 from today.  The CA 19-9 was stable on 08/27/2021.  Her overall clinical status has improved over the past few months.  Wendy Hoffman will return for an office visit and chemotherapy in 2 weeks.  Betsy Coder, MD  09/10/2021  9:53 AM

## 2021-09-11 LAB — CANCER ANTIGEN 19-9: CA 19-9: 9455 U/mL — ABNORMAL HIGH (ref 0–35)

## 2021-09-20 ENCOUNTER — Other Ambulatory Visit: Payer: Self-pay | Admitting: Oncology

## 2021-09-24 ENCOUNTER — Telehealth: Payer: Self-pay

## 2021-09-24 ENCOUNTER — Inpatient Hospital Stay: Payer: Medicaid Other

## 2021-09-24 ENCOUNTER — Inpatient Hospital Stay (HOSPITAL_BASED_OUTPATIENT_CLINIC_OR_DEPARTMENT_OTHER): Payer: Medicaid Other | Admitting: Oncology

## 2021-09-24 ENCOUNTER — Other Ambulatory Visit: Payer: Self-pay

## 2021-09-24 ENCOUNTER — Inpatient Hospital Stay: Payer: Medicaid Other | Attending: Nurse Practitioner

## 2021-09-24 VITALS — BP 100/68 | HR 88 | Temp 98.7°F | Resp 18 | Ht 64.0 in | Wt 182.0 lb

## 2021-09-24 DIAGNOSIS — Z5111 Encounter for antineoplastic chemotherapy: Secondary | ICD-10-CM | POA: Diagnosis not present

## 2021-09-24 DIAGNOSIS — C787 Secondary malignant neoplasm of liver and intrahepatic bile duct: Secondary | ICD-10-CM | POA: Insufficient documentation

## 2021-09-24 DIAGNOSIS — C250 Malignant neoplasm of head of pancreas: Secondary | ICD-10-CM

## 2021-09-24 LAB — MAGNESIUM: Magnesium: 2.1 mg/dL (ref 1.7–2.4)

## 2021-09-24 LAB — CBC WITH DIFFERENTIAL (CANCER CENTER ONLY)
Abs Immature Granulocytes: 0.01 10*3/uL (ref 0.00–0.07)
Basophils Absolute: 0 10*3/uL (ref 0.0–0.1)
Basophils Relative: 0 %
Eosinophils Absolute: 0.2 10*3/uL (ref 0.0–0.5)
Eosinophils Relative: 5 %
HCT: 33.1 % — ABNORMAL LOW (ref 36.0–46.0)
Hemoglobin: 11.2 g/dL — ABNORMAL LOW (ref 12.0–15.0)
Immature Granulocytes: 0 %
Lymphocytes Relative: 29 %
Lymphs Abs: 1.3 10*3/uL (ref 0.7–4.0)
MCH: 31 pg (ref 26.0–34.0)
MCHC: 33.8 g/dL (ref 30.0–36.0)
MCV: 91.7 fL (ref 80.0–100.0)
Monocytes Absolute: 0.5 10*3/uL (ref 0.1–1.0)
Monocytes Relative: 11 %
Neutro Abs: 2.4 10*3/uL (ref 1.7–7.7)
Neutrophils Relative %: 55 %
Platelet Count: 275 10*3/uL (ref 150–400)
RBC: 3.61 MIL/uL — ABNORMAL LOW (ref 3.87–5.11)
RDW: 14.6 % (ref 11.5–15.5)
WBC Count: 4.3 10*3/uL (ref 4.0–10.5)
nRBC: 0 % (ref 0.0–0.2)

## 2021-09-24 LAB — CMP (CANCER CENTER ONLY)
ALT: 18 U/L (ref 0–44)
AST: 28 U/L (ref 15–41)
Albumin: 3.8 g/dL (ref 3.5–5.0)
Alkaline Phosphatase: 69 U/L (ref 38–126)
Anion gap: 11 (ref 5–15)
BUN: 14 mg/dL (ref 6–20)
CO2: 28 mmol/L (ref 22–32)
Calcium: 9.9 mg/dL (ref 8.9–10.3)
Chloride: 100 mmol/L (ref 98–111)
Creatinine: 1.12 mg/dL — ABNORMAL HIGH (ref 0.44–1.00)
GFR, Estimated: 59 mL/min — ABNORMAL LOW (ref >60)
Glucose, Bld: 109 mg/dL — ABNORMAL HIGH (ref 70–99)
Potassium: 3.3 mmol/L — ABNORMAL LOW (ref 3.5–5.1)
Sodium: 139 mmol/L (ref 135–145)
Total Bilirubin: 0.4 mg/dL (ref 0.3–1.2)
Total Protein: 6.5 g/dL (ref 6.5–8.1)

## 2021-09-24 MED ORDER — PROCHLORPERAZINE MALEATE 10 MG PO TABS
10.0000 mg | ORAL_TABLET | Freq: Once | ORAL | Status: AC
  Administered 2021-09-24: 10 mg via ORAL
  Filled 2021-09-24: qty 1

## 2021-09-24 MED ORDER — HEPARIN SOD (PORK) LOCK FLUSH 100 UNIT/ML IV SOLN
500.0000 [IU] | Freq: Once | INTRAVENOUS | Status: AC | PRN
  Administered 2021-09-24: 500 [IU]

## 2021-09-24 MED ORDER — SODIUM CHLORIDE 0.9 % IV SOLN
1000.0000 mg/m2 | Freq: Once | INTRAVENOUS | Status: AC
  Administered 2021-09-24: 1938 mg via INTRAVENOUS
  Filled 2021-09-24: qty 50.97

## 2021-09-24 MED ORDER — SODIUM CHLORIDE 0.9 % IV SOLN: Freq: Once | INTRAVENOUS | Status: AC

## 2021-09-24 MED ORDER — SODIUM CHLORIDE 0.9% FLUSH
10.0000 mL | INTRAVENOUS | Status: DC | PRN
  Administered 2021-09-24: 10 mL

## 2021-09-24 NOTE — Patient Instructions (Signed)
[facility]  Discharge Instructions: Thank you for choosing [facility] to provide your oncology and hematology care.   If you have a lab appointment with the [facility], please go directly to the [facility] and check in at the registration area.   Wear comfortable clothing and clothing appropriate for easy access to any Portacath or PICC line.   We strive to give you quality time with your provider. You may need to reschedule your appointment if you arrive late (15 or more minutes).  Arriving late affects you and other patients whose appointments are after yours.  Also, if you miss three or more appointments without notifying the office, you may be dismissed from the clinic at the provider's discretion.      For prescription refill requests, have your pharmacy contact our office and allow 72 hours for refills to be completed.    Today you received the following chemotherapy and/or immunotherapy agents Gemzar     To help prevent nausea and vomiting after your treatment, we encourage you to take your nausea medication as directed.  BELOW ARE SYMPTOMS THAT SHOULD BE REPORTED IMMEDIATELY: *FEVER GREATER THAN 100.4 F (38 C) OR HIGHER *CHILLS OR SWEATING *NAUSEA AND VOMITING THAT IS NOT CONTROLLED WITH YOUR NAUSEA MEDICATION *UNUSUAL SHORTNESS OF BREATH *UNUSUAL BRUISING OR BLEEDING *URINARY PROBLEMS (pain or burning when urinating, or frequent urination) *BOWEL PROBLEMS (unusual diarrhea, constipation, pain near the anus) TENDERNESS IN MOUTH AND THROAT WITH OR WITHOUT PRESENCE OF ULCERS (sore throat, sores in mouth, or a toothache) UNUSUAL RASH, SWELLING OR PAIN  UNUSUAL VAGINAL DISCHARGE OR ITCHING   Items with * indicate a potential emergency and should be followed up as soon as possible or go to the Emergency Department if any problems should occur.  Please show the CHEMOTHERAPY ALERT CARD or IMMUNOTHERAPY ALERT CARD at check-in to the  Emergency Department and triage nurse.  Should you have questions after your visit or need to cancel or reschedule your appointment, please contact [facility]  Dept: [phone]  and follow the prompts.  Office hours are 8:00 a.m. to 4:30 p.m. Monday - Friday. Please note that voicemails left after 4:00 p.m. may not be returned until the following business day.  We are closed weekends and major holidays. You have access to a nurse at all times for urgent questions. Please call the main number to the clinic Dept: [phone] and follow the prompts.   For any non-urgent questions, you may also contact your provider using MyChart. We now offer e-Visits for anyone 53 and older to request care online for non-urgent symptoms. For details visit [email or web address].   Also download the MyChart app! Go to the app store, search "MyChart", open the app, select [facility], and log in with your MyChart username and password.  Due to Covid, a mask is required upon entering the hospital/clinic. If you do not have a mask, one will be given to you upon arrival. For doctor visits, patients may have 1 support person aged 53 or older with them. For treatment visits, patients cannot have anyone with them due to current Covid guidelines and our immunocompromised population.   Gemcitabine injection What is this medication? GEMCITABINE (jem SYE ta been) is a chemotherapy drug. This medicine is used to treat many types of cancer like breast cancer, lung cancer, pancreatic cancer, and ovarian cancer. This medicine may be used for other purposes; ask your health care provider or pharmacist  if you have questions. COMMON BRAND NAME(S): Gemzar, Infugem What should I tell my care team before I take this medication? They need to know if you have any of these conditions: blood disorders infection kidney disease liver disease lung or breathing disease, like asthma recent or ongoing radiation  therapy an unusual or allergic reaction to gemcitabine, other chemotherapy, other medicines, foods, dyes, or preservatives pregnant or trying to get pregnant breast-feeding How should I use this medication? This drug is given as an infusion into a vein. It is administered in a hospital or clinic by a specially trained health care professional. Talk to your pediatrician regarding the use of this medicine in children. Special care may be needed. Overdosage: If you think you have taken too much of this medicine contact a poison control center or emergency room at once. NOTE: This medicine is only for you. Do not share this medicine with others. What if I miss a dose? It is important not to miss your dose. Call your doctor or health care professional if you are unable to keep an appointment. What may interact with this medication? medicines to increase blood counts like filgrastim, pegfilgrastim, sargramostim some other chemotherapy drugs like cisplatin vaccines Talk to your doctor or health care professional before taking any of these medicines: acetaminophen aspirin ibuprofen ketoprofen naproxen This list may not describe all possible interactions. Give your health care provider a list of all the medicines, herbs, non-prescription drugs, or dietary supplements you use. Also tell them if you smoke, drink alcohol, or use illegal drugs. Some items may interact with your medicine. What should I watch for while using this medication? Visit your doctor for checks on your progress. This drug may make you feel generally unwell. This is not uncommon, as chemotherapy can affect healthy cells as well as cancer cells. Report any side effects. Continue your course of treatment even though you feel ill unless your doctor tells you to stop. In some cases, you may be given additional medicines to help with side effects. Follow all directions for their use. Call your doctor or health care professional for  advice if you get a fever, chills or sore throat, or other symptoms of a cold or flu. Do not treat yourself. This drug decreases your body's ability to fight infections. Try to avoid being around people who are sick. This medicine may increase your risk to bruise or bleed. Call your doctor or health care professional if you notice any unusual bleeding. Be careful brushing and flossing your teeth or using a toothpick because you may get an infection or bleed more easily. If you have any dental work done, tell your dentist you are receiving this medicine. Avoid taking products that contain aspirin, acetaminophen, ibuprofen, naproxen, or ketoprofen unless instructed by your doctor. These medicines may hide a fever. Do not become pregnant while taking this medicine or for 6 months after stopping it. Women should inform their doctor if they wish to become pregnant or think they might be pregnant. Men should not father a child while taking this medicine and for 3 months after stopping it. There is a potential for serious side effects to an unborn child. Talk to your health care professional or pharmacist for more information. Do not breast-feed an infant while taking this medicine or for at least 1 week after stopping it. Men should inform their doctors if they wish to father a child. This medicine may lower sperm counts. Talk with your doctor or health care professional  if you are concerned about your fertility. What side effects may I notice from receiving this medication? Side effects that you should report to your doctor or health care professional as soon as possible: allergic reactions like skin rash, itching or hives, swelling of the face, lips, or tongue breathing problems pain, redness, or irritation at site where injected signs and symptoms of a dangerous change in heartbeat or heart rhythm like chest pain; dizziness; fast or irregular heartbeat; palpitations; feeling faint or lightheaded, falls;  breathing problems signs of decreased platelets or bleeding - bruising, pinpoint red spots on the skin, black, tarry stools, blood in the urine signs of decreased red blood cells - unusually weak or tired, feeling faint or lightheaded, falls signs of infection - fever or chills, cough, sore throat, pain or difficulty passing urine signs and symptoms of kidney injury like trouble passing urine or change in the amount of urine signs and symptoms of liver injury like dark yellow or brown urine; general ill feeling or flu-like symptoms; light-colored stools; loss of appetite; nausea; right upper belly pain; unusually weak or tired; yellowing of the eyes or skin swelling of ankles, feet, hands Side effects that usually do not require medical attention (report to your doctor or health care professional if they continue or are bothersome): constipation diarrhea hair loss loss of appetite nausea rash vomiting This list may not describe all possible side effects. Call your doctor for medical advice about side effects. You may report side effects to FDA at 1-800-FDA-1088. Where should I keep my medication? This drug is given in a hospital or clinic and will not be stored at home. NOTE: This sheet is a summary. It may not cover all possible information. If you have questions about this medicine, talk to your doctor, pharmacist, or health care provider.  2022 Elsevier/Gold Standard (2018-02-22 18:06:11)

## 2021-09-24 NOTE — Progress Notes (Signed)
Patient presents for treatment. RN assessment completed along with the following:  Labs/vitals reviewed - Yes, and within treatment parameters.   Weight within 10% of previous measurement - Yes Oncology Treatment Attestation completed for current therapy- Yes, on date 07/07/21 Informed consent completed and reflects current therapy/intent - Yes, on date 07/30/21             Provider progress note reviewed - Today's provider note is not yet available. I reviewed the most recent oncology provider progress note in chart dated 09/10/21. Treatment/Antibody/Supportive plan reviewed - Yes, and Abraxane discontinued S&H and other orders reviewed - Yes, and there are no additional orders identified. Previous treatment date reviewed - Yes, and the appropriate amount of time has elapsed between treatments. Clinic Hand Off Received from - Stasia Cavalier, RN and Lenox Ponds, RN  Patient to proceed with treatment.

## 2021-09-24 NOTE — Telephone Encounter (Signed)
Patient seen by Dr. Benay Spice today  Vitals are within treatment parameters.  Labs reviewed by Dr. Benay Spice and are within treatment parameters.  Per physician team, patient is ready for treatment. Please note that modifications are being made to the treatment plan including abraxane taken out of treatment today

## 2021-09-24 NOTE — Progress Notes (Signed)
Wendy Hoffman OFFICE PROGRESS NOTE   Diagnosis: Pancreas cancer  INTERVAL HISTORY:   Wendy Hoffman completed another treatment with gemcitabine on 09/10/2021.  She continues to have numbness in the hands and feet.  This has not improved.  She has upper abdominal pain.  She takes MS Contin twice daily.  She takes Roxanol for breakthrough pain.  The Roxanol is not providing adequate pain relief.  She has intermittent episodes of vomiting.  She notes a fullness in the upper abdomen.  Objective:  Vital signs in last 24 hours:  Blood pressure 100/68, pulse 88, temperature 98.7 F (37.1 C), temperature source Oral, resp. rate 18, height '5\' 4"'  (1.626 m), weight 182 lb (82.6 kg), SpO2 100 %.    HEENT: No buccal thrush, white coat over the tongue Resp: Lungs with mild wheezing of the right upper posterior chest, no respiratory distress Cardio: Regular rate and rhythm GI: Fullness with associated tenderness in the subxiphoid region.  No hepatosplenomegaly Vascular: No leg edema   Portacath/PICC-without erythema  Lab Results:  Lab Results  Component Value Date   WBC 4.3 09/24/2021   HGB 11.2 (L) 09/24/2021   HCT 33.1 (L) 09/24/2021   MCV 91.7 09/24/2021   PLT 275 09/24/2021   NEUTROABS 2.4 09/24/2021    CMP  Lab Results  Component Value Date   NA 139 09/10/2021   K 3.9 09/10/2021   CL 103 09/10/2021   CO2 26 09/10/2021   GLUCOSE 103 (H) 09/10/2021   BUN 23 (H) 09/10/2021   CREATININE 1.07 (H) 09/10/2021   CALCIUM 10.3 09/10/2021   PROT 6.5 09/10/2021   ALBUMIN 3.9 09/10/2021   AST 34 09/10/2021   ALT 27 09/10/2021   ALKPHOS 85 09/10/2021   BILITOT 0.4 09/10/2021   GFRNONAA >60 09/10/2021   GFRAA >60 07/21/2020    Lab Results  Component Value Date   CEA1 68.8 (H) 02/23/2021   CEA <0.5 10/15/2008   ZOX096 9,455 (H) 09/10/2021    Medications: I have reviewed the patient's current medications.   Assessment/Plan:  Pancreas cancer -CT abdomen/pelvis  without contrast 02/16/2021-multiple large hypoechoic masses in the patient's liver consistent metastatic disease, ill-defined masslike area in the pancreatic head measuring approximate 4.7 cm, possible filling defect in the right ventricle. -EGD 02/19/2021-large infiltrative mass with bleeding in the second portion of the duodenum, appears to be arising near the ampulla, partially obstructive with friable mucosa.  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 -Flexible sigmoidoscopy 02/19/2021-diverticulosis in the sigmoid colon, nonbleeding external and internal hemorrhoids -Cardiac CT 02/20/2021-mass in the mid RV attached to the interventricular septum measuring 16 mm x 6 mm and concerning for metastatic tumor -MRI abdomen with/without contrast 02/21/2021-mass in the posterior pancreatic head measuring 4.3 x 3.8 cm with obstruction of the central pancreatic duct and diffuse dilatation up to 6 mm consistent with pancreatic adenocarcinoma, liver lesions the largest mass of the central left lobe measuring 9.9 x 9.2 cm consistent with hepatic metastatic disease,  hepatomegaly. -Ultrasound-guided biopsy of a left liver lesion 02/24/2021-adenocarcinoma, morphologically similar to the duodenal biopsy; microsatellite stable, tumor mutation burden 1 -Negative genetic testing -Palliative radiation to the duodenal mass 02/25/2021-03/06/2021 -Cycle 1 FOLFOX 03/17/2021 -Cycle 2 FOLFOX 04/01/2021 -Cycle 3 FOLFIRINOX 04/14/2021 -Cycle 4 FOLFIRINOX 04/27/2021, Udenyca added -Cycle 5 FOLFIRINOX 05/12/2021, Udenyca -MRI abdomen 05/23/2021-decrease in size of pancreas mass and hepatic lesions, no progressive disease -Cycle 6 FOLFIRINOX 05/27/2021 -CT 06/17/2021-multiple liver metastases similar to her prior exam.  Pancreas mass not significantly changed. -CT  06/26/2021-stable pancreas mass, no significant change in size and number of known liver metastases. -CT abdomen/pelvis 07/12/2021-new gastrostomy tube.   Multiple liver masses slightly decreased in size from prior exam.  Pancreas head mass unchanged. -Cycle 1 gemcitabine/Abraxane 07/30/2021 -Cycle 2 gemcitabine/Abraxane 08/13/2021 -Cycle 3 gemcitabine 08/27/2021, Abraxane held due to significant bilateral toe pain, question neuropathy -Cycle 4 gemcitabine, Abraxane held due to neuropathy, 09/10/2021 -Cycle 5 gemcitabine, Abraxane held due to neuropathy, 09/24/2021 2.  Iron deficiency anemia-improved -Feraheme 510 mg 02/20/2021 3.  Protein calorie malnutrition 4.  Possible right ventricular filling defect on noncontrast CT scan MRI cardiac morphology 02/21/2021-mass in the mid RV attached to the interventricular septum measures 16 mm x 6 mm.  Mass appears heterogeneous with area of enhancement on LGE imaging.  Mass is concerning for metastatic tumor.  Normal LV size and systolic function.  Normal RV size and systolic function. 5.  Hypertension 6.  History of CVA 7.  Hyperlipidemia 8.  Hypothyroidism 9.  Morbid obesity 10.  Asthma 11.  Migraines 12.  Pain secondary to #1 13.  Port-A-Cath placement 02/25/2021 14.  Hypokalemia 04/01/2021-started a potassium supplement 15.  Hospital admission 06/17/2021-intractable nausea and vomiting EGD 06/19/2021-diffuse gastritis, nonobstructing duodenal mass, biopsy of stomach-no malignancy or H. pylori, duodenal mass-adenocarcinoma 16.  Hospital admission 07/12/2021 with nausea/vomiting, abdominal pain, and hypokalemia 17.  Percutaneous gastrostomy tube placement 07/01/2021     Disposition: Wendy Hoffman appears unchanged.  She continues to have neuropathy symptoms.  Abraxane will remain on hold.  The CA 19-9 was lower when she was here 2 weeks ago.  She will complete another treatment with gemcitabine today.  We will follow-up on the CA 19-9 from today.  She will return for an office visit and chemotherapy in 2 weeks.  She will discontinue amlodipine.  She will increase the Roxanol dose to 20-30 mg as needed for  breakthrough pain.  Wendy Hoffman will be scheduled for a restaging CT abdomen after the next cycle of chemotherapy.  Betsy Coder, MD  09/24/2021  10:33 AM

## 2021-09-25 LAB — CANCER ANTIGEN 19-9: CA 19-9: 12418 U/mL — ABNORMAL HIGH (ref 0–35)

## 2021-09-26 ENCOUNTER — Other Ambulatory Visit: Payer: Self-pay | Admitting: Oncology

## 2021-09-26 DIAGNOSIS — Z95828 Presence of other vascular implants and grafts: Secondary | ICD-10-CM

## 2021-10-04 ENCOUNTER — Other Ambulatory Visit: Payer: Self-pay | Admitting: Oncology

## 2021-10-07 ENCOUNTER — Inpatient Hospital Stay: Payer: Medicaid Other | Admitting: Nurse Practitioner

## 2021-10-07 ENCOUNTER — Inpatient Hospital Stay: Payer: Medicaid Other | Admitting: Nutrition

## 2021-10-07 ENCOUNTER — Inpatient Hospital Stay: Payer: Medicaid Other

## 2021-10-07 ENCOUNTER — Telehealth: Payer: Self-pay | Admitting: *Deleted

## 2021-10-07 NOTE — Telephone Encounter (Signed)
Called patient to f/u on "no show" today. She reports thinking her appointment was tomorrow due to always coming on Thursday. High priority scheduling message sent to reschedule for 1st available. Also reports constipation with no BM in 2 weeks. Reports her abdomen and back hurt and having some nausea. Taking colace bid, suppository everyday and a dulcolax daily. Suggested she take MiraLax bid, try oral magnesium citrate, increase colace to #2 twice daily and can try a Fleet enema. Call back on 10/28 w/update. Also encouraged her to push po fluids

## 2021-10-11 ENCOUNTER — Other Ambulatory Visit: Payer: Self-pay | Admitting: Oncology

## 2021-10-13 ENCOUNTER — Other Ambulatory Visit: Payer: Self-pay

## 2021-10-13 DIAGNOSIS — C250 Malignant neoplasm of head of pancreas: Secondary | ICD-10-CM

## 2021-10-15 NOTE — Progress Notes (Signed)
[facility] OFFICE PROGRESS NOTE   Diagnosis: Pancreas cancer  INTERVAL HISTORY:   Ms. Decamp completed another treatment with gemcitabine on 09/24/2021.  No fever or rash.  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  She has pain.  She has difficulty holding objects.  Gabapentin has not helped.  She continues to have intermittent episodes of nausea and vomiting.  She does not vomit daily.  Objective:  Vital signs in last 24 hours:  Blood pressure 104/70, pulse 90, temperature 97.7 F (36.5 C), temperature source Oral, resp. rate 18, weight 180 lb 3.2 oz (81.7 kg), SpO2 100 %.    HEENT: No buccal thrush or ulcers, white coat over the tongue Resp: Lungs clear bilaterally Cardio: Regular rate and rhythm GI: No hepatosplenomegaly, fullness in the mid upper abdomen with associated tenderness, no discrete mass Vascular: No leg edema   Portacath/PICC-without erythema  Lab Results:  Lab Results  Component Value Date   WBC 4.7 10/16/2021   HGB 10.8 (L) 10/16/2021   HCT 32.7 (L) 10/16/2021   MCV 93.7 10/16/2021   PLT 334 10/16/2021   NEUTROABS 2.7 10/16/2021    CMP  Lab Results  Component Value Date   NA 139 10/16/2021   K 3.4 (L) 10/16/2021   CL 102 10/16/2021   CO2 30 10/16/2021   GLUCOSE 101 (H) 10/16/2021   BUN 9 10/16/2021   CREATININE 0.94 10/16/2021   CALCIUM 9.2 10/16/2021   PROT 5.8 (L) 10/16/2021   ALBUMIN 3.2 (L) 10/16/2021   AST 16 10/16/2021   ALT 8 10/16/2021   ALKPHOS 67 10/16/2021   BILITOT 0.4 10/16/2021   GFRNONAA >60 10/16/2021   GFRAA >60 07/21/2020    Lab Results  Component Value Date   CEA1 68.8 (H) 02/23/2021   CEA <0.5 10/15/2008   YSA630 12,418 (H) 09/24/2021   Medications: I have reviewed the patient's current medications.   Assessment/Plan: Pancreas cancer -CT abdomen/pelvis without contrast 02/16/2021-multiple large hypoechoic masses in the patient's liver consistent metastatic disease,  ill-defined masslike area in the pancreatic head measuring approximate 4.7 cm, possible filling defect in the right ventricle. -EGD 02/19/2021-large infiltrative mass with bleeding in the second portion of the duodenum, appears to be arising near the ampulla, partially obstructive with friable mucosa.  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 -Flexible sigmoidoscopy 02/19/2021-diverticulosis in the sigmoid colon, nonbleeding external and internal hemorrhoids -Cardiac CT 02/20/2021-mass in the mid RV attached to the interventricular septum measuring 16 mm x 6 mm and concerning for metastatic tumor -MRI abdomen with/without contrast 02/21/2021-mass in the posterior pancreatic head measuring 4.3 x 3.8 cm with obstruction of the central pancreatic duct and diffuse dilatation up to 6 mm consistent with pancreatic adenocarcinoma, liver lesions the largest mass of the central left lobe measuring 9.9 x 9.2 cm consistent with hepatic metastatic disease,  hepatomegaly. -Ultrasound-guided biopsy of a left liver lesion 02/24/2021-adenocarcinoma, morphologically similar to the duodenal biopsy; microsatellite stable, tumor mutation burden 1 -Negative genetic testing -Palliative radiation to the duodenal mass 02/25/2021-03/06/2021 -Cycle 1 FOLFOX 03/17/2021 -Cycle 2 FOLFOX 04/01/2021 -Cycle 3 FOLFIRINOX 04/14/2021 -Cycle 4 FOLFIRINOX 04/27/2021, Udenyca added -Cycle 5 FOLFIRINOX 05/12/2021, Udenyca -MRI abdomen 05/23/2021-decrease in size of pancreas mass and hepatic lesions, no progressive disease -Cycle 6 FOLFIRINOX 05/27/2021 -CT 06/17/2021-multiple liver metastases similar to her prior exam.  Pancreas mass not significantly changed. -CT 06/26/2021-stable pancreas mass, no significant change in size and number of known liver metastases. -CT abdomen/pelvis 07/12/2021-new gastrostomy tube.  Multiple liver  masses slightly decreased in size from prior exam.  Pancreas head mass unchanged. -Cycle 1  gemcitabine/Abraxane 07/30/2021 -Cycle 2 gemcitabine/Abraxane 08/13/2021 -Cycle 3 gemcitabine 08/27/2021, Abraxane held due to significant bilateral toe pain, question neuropathy -Cycle 4 gemcitabine, Abraxane held due to neuropathy, 09/10/2021 -Cycle 5 gemcitabine, Abraxane held due to neuropathy, 09/24/2021 -Cycle 6 gemcitabine, Abraxane held due to neuropathy 10/16/2021 2.  Iron deficiency anemia-improved -Feraheme 510 mg 02/20/2021 3.  Protein calorie malnutrition 4.  Possible right ventricular filling defect on noncontrast CT scan MRI cardiac morphology 02/21/2021-mass in the mid RV attached to the interventricular septum measures 16 mm x 6 mm.  Mass appears heterogeneous with area of enhancement on LGE imaging.  Mass is concerning for metastatic tumor.  Normal LV size and systolic function.  Normal RV size and systolic function. 5.  Hypertension 6.  History of CVA 7.  Hyperlipidemia 8.  Hypothyroidism 9.  Morbid obesity 10.  Asthma 11.  Migraines 12.  Pain secondary to #1 13.  Port-A-Cath placement 02/25/2021 14.  Hypokalemia 04/01/2021-started a potassium supplement 15.  Hospital admission 06/17/2021-intractable nausea and vomiting EGD 06/19/2021-diffuse gastritis, nonobstructing duodenal mass, biopsy of stomach-no malignancy or H. pylori, duodenal mass-adenocarcinoma 16.  Hospital admission 07/12/2021 with nausea/vomiting, abdominal pain, and hypokalemia 17.  Percutaneous gastrostomy tube placement 07/01/2021       Disposition: Ms. Tartaglia appears unchanged.  She has progressive neuropathy symptoms.  I suspect this is related to oxaliplatin, but the symptoms began after she received Abraxane.  Abraxane will remain on hold.  She will complete another treatment with gemcitabine today.  She will undergo restaging CTs prior to an office visit in 2 weeks.  Ms. Rayborn will discontinue gabapentin.  She will begin a trial of Cymbalta for neuropathy.  I refilled her prescription for  Roxanol.  Betsy Coder, MD  10/16/2021  10:26 AM

## 2021-10-16 ENCOUNTER — Inpatient Hospital Stay: Payer: Medicaid Other

## 2021-10-16 ENCOUNTER — Other Ambulatory Visit: Payer: Self-pay

## 2021-10-16 ENCOUNTER — Other Ambulatory Visit: Payer: Self-pay | Admitting: Nurse Practitioner

## 2021-10-16 ENCOUNTER — Inpatient Hospital Stay: Payer: Medicaid Other | Attending: Nurse Practitioner

## 2021-10-16 ENCOUNTER — Other Ambulatory Visit: Payer: Self-pay | Admitting: Oncology

## 2021-10-16 ENCOUNTER — Inpatient Hospital Stay (HOSPITAL_BASED_OUTPATIENT_CLINIC_OR_DEPARTMENT_OTHER): Payer: Medicaid Other | Admitting: Oncology

## 2021-10-16 VITALS — BP 104/70 | HR 90 | Temp 97.7°F | Resp 18 | Wt 180.2 lb

## 2021-10-16 DIAGNOSIS — J4540 Moderate persistent asthma, uncomplicated: Secondary | ICD-10-CM

## 2021-10-16 DIAGNOSIS — C250 Malignant neoplasm of head of pancreas: Secondary | ICD-10-CM | POA: Diagnosis not present

## 2021-10-16 DIAGNOSIS — Z5111 Encounter for antineoplastic chemotherapy: Secondary | ICD-10-CM | POA: Insufficient documentation

## 2021-10-16 DIAGNOSIS — C787 Secondary malignant neoplasm of liver and intrahepatic bile duct: Secondary | ICD-10-CM | POA: Diagnosis not present

## 2021-10-16 LAB — CBC WITH DIFFERENTIAL (CANCER CENTER ONLY)
Abs Immature Granulocytes: 0.01 10*3/uL (ref 0.00–0.07)
Basophils Absolute: 0 10*3/uL (ref 0.0–0.1)
Basophils Relative: 0 %
Eosinophils Absolute: 0.2 10*3/uL (ref 0.0–0.5)
Eosinophils Relative: 4 %
HCT: 32.7 % — ABNORMAL LOW (ref 36.0–46.0)
Hemoglobin: 10.8 g/dL — ABNORMAL LOW (ref 12.0–15.0)
Immature Granulocytes: 0 %
Lymphocytes Relative: 28 %
Lymphs Abs: 1.3 10*3/uL (ref 0.7–4.0)
MCH: 30.9 pg (ref 26.0–34.0)
MCHC: 33 g/dL (ref 30.0–36.0)
MCV: 93.7 fL (ref 80.0–100.0)
Monocytes Absolute: 0.5 10*3/uL (ref 0.1–1.0)
Monocytes Relative: 10 %
Neutro Abs: 2.7 10*3/uL (ref 1.7–7.7)
Neutrophils Relative %: 58 %
Platelet Count: 334 10*3/uL (ref 150–400)
RBC: 3.49 MIL/uL — ABNORMAL LOW (ref 3.87–5.11)
RDW: 15.9 % — ABNORMAL HIGH (ref 11.5–15.5)
WBC Count: 4.7 10*3/uL (ref 4.0–10.5)
nRBC: 0 % (ref 0.0–0.2)

## 2021-10-16 LAB — CMP (CANCER CENTER ONLY)
ALT: 8 U/L (ref 0–44)
AST: 16 U/L (ref 15–41)
Albumin: 3.2 g/dL — ABNORMAL LOW (ref 3.5–5.0)
Alkaline Phosphatase: 67 U/L (ref 38–126)
Anion gap: 7 (ref 5–15)
BUN: 9 mg/dL (ref 6–20)
CO2: 30 mmol/L (ref 22–32)
Calcium: 9.2 mg/dL (ref 8.9–10.3)
Chloride: 102 mmol/L (ref 98–111)
Creatinine: 0.94 mg/dL (ref 0.44–1.00)
GFR, Estimated: >60 mL/min (ref >60)
Glucose, Bld: 101 mg/dL — ABNORMAL HIGH (ref 70–99)
Potassium: 3.4 mmol/L — ABNORMAL LOW (ref 3.5–5.1)
Sodium: 139 mmol/L (ref 135–145)
Total Bilirubin: 0.4 mg/dL (ref 0.3–1.2)
Total Protein: 5.8 g/dL — ABNORMAL LOW (ref 6.5–8.1)

## 2021-10-16 MED ORDER — DULOXETINE HCL 30 MG PO CPEP: ORAL_CAPSULE | ORAL | 0 refills | Status: DC

## 2021-10-16 MED ORDER — HEPARIN SOD (PORK) LOCK FLUSH 100 UNIT/ML IV SOLN
500.0000 [IU] | Freq: Once | INTRAVENOUS | Status: AC | PRN
  Administered 2021-10-16: 500 [IU]

## 2021-10-16 MED ORDER — SODIUM CHLORIDE 0.9 % IV SOLN
1000.0000 mg/m2 | Freq: Once | INTRAVENOUS | Status: AC
  Administered 2021-10-16: 1938 mg via INTRAVENOUS
  Filled 2021-10-16: qty 50.97

## 2021-10-16 MED ORDER — ZOLPIDEM TARTRATE 10 MG PO TABS: 10.0000 mg | ORAL_TABLET | Freq: Every evening | ORAL | 0 refills | Status: DC | PRN

## 2021-10-16 MED ORDER — SODIUM CHLORIDE 0.9% FLUSH
10.0000 mL | INTRAVENOUS | Status: DC | PRN
  Administered 2021-10-16: 10 mL

## 2021-10-16 MED ORDER — PROCHLORPERAZINE MALEATE 10 MG PO TABS
10.0000 mg | ORAL_TABLET | Freq: Once | ORAL | Status: AC
  Administered 2021-10-16: 10 mg via ORAL
  Filled 2021-10-16: qty 1

## 2021-10-16 MED ORDER — SODIUM CHLORIDE 0.9 % IV SOLN: Freq: Once | INTRAVENOUS | Status: AC

## 2021-10-16 MED ORDER — BUDESONIDE-FORMOTEROL FUMARATE 160-4.5 MCG/ACT IN AERO: 2.0000 | INHALATION_SPRAY | Freq: Two times a day (BID) | RESPIRATORY_TRACT | 0 refills | Status: DC

## 2021-10-16 MED ORDER — MORPHINE SULFATE (CONCENTRATE) 10 MG/0.5ML PO SOLN: 10.0000 mg | ORAL | 0 refills | Status: DC | PRN

## 2021-10-16 NOTE — Progress Notes (Signed)
Patient seen by Dr. Benay Spice today  Vitals are within treatment parameters.  Labs reviewed by Dr. Benay Spice and are within treatment parameters.  Per physician team, patient is ready for treatment. Please note that modifications are being made to the treatment plan including Hold Abraxane due to progressive neuropathy

## 2021-10-16 NOTE — Patient Instructions (Signed)

## 2021-10-16 NOTE — Patient Instructions (Signed)
Will begin Cymbalta 30 mg daily for 1 week, then increase to 60 mg daily. Discontinue the gabapentin when you begin the Cymbalta

## 2021-10-16 NOTE — Progress Notes (Signed)
Patient presents for treatment. RN assessment completed along with the following:  Labs/vitals reviewed - Yes, and within treatment parameters.   Weight within 10% of previous measurement - Yes Oncology Treatment Attestation completed for current therapy- Yes, on date 07/07/21 Informed consent completed and reflects current therapy/intent - Yes, on date 04/29/21             Provider progress note reviewed - Patient not seen by provider today. Most recent note dated 09/24/21 reviewed. Treatment/Antibody/Supportive plan reviewed - Yes, and Abraxane to be taken out of treatment plan for today S&H and other orders reviewed - Yes, and there are no additional orders identified. Previous treatment date reviewed - Yes, and the appropriate amount of time has elapsed between treatments. Clinic Hand Off Received from - Merceda Elks, RN  Patient to proceed with treatment.

## 2021-10-16 NOTE — Patient Instructions (Signed)
[facility]  Discharge Instructions: Thank you for choosing [facility] to provide your oncology and hematology care.   If you have a lab appointment with the [facility], please go directly to the [facility] and check in at the registration area.   Wear comfortable clothing and clothing appropriate for easy access to any Portacath or PICC line.   We strive to give you quality time with your provider. You may need to reschedule your appointment if you arrive late (15 or more minutes).  Arriving late affects you and other patients whose appointments are after yours.  Also, if you miss three or more appointments without notifying the office, you may be dismissed from the clinic at the provider's discretion.      For prescription refill requests, have your pharmacy contact our office and allow 72 hours for refills to be completed.    Today you received the following chemotherapy and/or immunotherapy agents Gemzar     To help prevent nausea and vomiting after your treatment, we encourage you to take your nausea medication as directed.  BELOW ARE SYMPTOMS THAT SHOULD BE REPORTED IMMEDIATELY: *FEVER GREATER THAN 100.4 F (38 C) OR HIGHER *CHILLS OR SWEATING *NAUSEA AND VOMITING THAT IS NOT CONTROLLED WITH YOUR NAUSEA MEDICATION *UNUSUAL SHORTNESS OF BREATH *UNUSUAL BRUISING OR BLEEDING *URINARY PROBLEMS (pain or burning when urinating, or frequent urination) *BOWEL PROBLEMS (unusual diarrhea, constipation, pain near the anus) TENDERNESS IN MOUTH AND THROAT WITH OR WITHOUT PRESENCE OF ULCERS (sore throat, sores in mouth, or a toothache) UNUSUAL RASH, SWELLING OR PAIN  UNUSUAL VAGINAL DISCHARGE OR ITCHING   Items with * indicate a potential emergency and should be followed up as soon as possible or go to the Emergency Department if any problems should occur.  Please show the CHEMOTHERAPY ALERT CARD or IMMUNOTHERAPY ALERT CARD at check-in to the  Emergency Department and triage nurse.  Should you have questions after your visit or need to cancel or reschedule your appointment, please contact [facility]  Dept: [phone]  and follow the prompts.  Office hours are 8:00 a.m. to 4:30 p.m. Monday - Friday. Please note that voicemails left after 4:00 p.m. may not be returned until the following business day.  We are closed weekends and major holidays. You have access to a nurse at all times for urgent questions. Please call the main number to the clinic Dept: [phone] and follow the prompts.   For any non-urgent questions, you may also contact your provider using MyChart. We now offer e-Visits for anyone 93 and older to request care online for non-urgent symptoms. For details visit [email or web address].   Also download the MyChart app! Go to the app store, search "MyChart", open the app, select [facility], and log in with your MyChart username and password.  Due to Covid, a mask is required upon entering the hospital/clinic. If you do not have a mask, one will be given to you upon arrival. For doctor visits, patients may have 1 support person aged 55 or older with them. For treatment visits, patients cannot have anyone with them due to current Covid guidelines and our immunocompromised population.   Gemcitabine injection What is this medication? GEMCITABINE (jem SYE ta been) is a chemotherapy drug. This medicine is used to treat many types of cancer like breast cancer, lung cancer, pancreatic cancer, and ovarian cancer. This medicine may be used for other purposes; ask your health care provider or pharmacist  if you have questions. COMMON BRAND NAME(S): Gemzar, Infugem What should I tell my care team before I take this medication? They need to know if you have any of these conditions: blood disorders infection kidney disease liver disease lung or breathing disease, like asthma recent or ongoing radiation  therapy an unusual or allergic reaction to gemcitabine, other chemotherapy, other medicines, foods, dyes, or preservatives pregnant or trying to get pregnant breast-feeding How should I use this medication? This drug is given as an infusion into a vein. It is administered in a hospital or clinic by a specially trained health care professional. Talk to your pediatrician regarding the use of this medicine in children. Special care may be needed. Overdosage: If you think you have taken too much of this medicine contact a poison control center or emergency room at once. NOTE: This medicine is only for you. Do not share this medicine with others. What if I miss a dose? It is important not to miss your dose. Call your doctor or health care professional if you are unable to keep an appointment. What may interact with this medication? medicines to increase blood counts like filgrastim, pegfilgrastim, sargramostim some other chemotherapy drugs like cisplatin vaccines Talk to your doctor or health care professional before taking any of these medicines: acetaminophen aspirin ibuprofen ketoprofen naproxen This list may not describe all possible interactions. Give your health care provider a list of all the medicines, herbs, non-prescription drugs, or dietary supplements you use. Also tell them if you smoke, drink alcohol, or use illegal drugs. Some items may interact with your medicine. What should I watch for while using this medication? Visit your doctor for checks on your progress. This drug may make you feel generally unwell. This is not uncommon, as chemotherapy can affect healthy cells as well as cancer cells. Report any side effects. Continue your course of treatment even though you feel ill unless your doctor tells you to stop. In some cases, you may be given additional medicines to help with side effects. Follow all directions for their use. Call your doctor or health care professional for  advice if you get a fever, chills or sore throat, or other symptoms of a cold or flu. Do not treat yourself. This drug decreases your body's ability to fight infections. Try to avoid being around people who are sick. This medicine may increase your risk to bruise or bleed. Call your doctor or health care professional if you notice any unusual bleeding. Be careful brushing and flossing your teeth or using a toothpick because you may get an infection or bleed more easily. If you have any dental work done, tell your dentist you are receiving this medicine. Avoid taking products that contain aspirin, acetaminophen, ibuprofen, naproxen, or ketoprofen unless instructed by your doctor. These medicines may hide a fever. Do not become pregnant while taking this medicine or for 6 months after stopping it. Women should inform their doctor if they wish to become pregnant or think they might be pregnant. Men should not father a child while taking this medicine and for 3 months after stopping it. There is a potential for serious side effects to an unborn child. Talk to your health care professional or pharmacist for more information. Do not breast-feed an infant while taking this medicine or for at least 1 week after stopping it. Men should inform their doctors if they wish to father a child. This medicine may lower sperm counts. Talk with your doctor or health care professional  if you are concerned about your fertility. What side effects may I notice from receiving this medication? Side effects that you should report to your doctor or health care professional as soon as possible: allergic reactions like skin rash, itching or hives, swelling of the face, lips, or tongue breathing problems pain, redness, or irritation at site where injected signs and symptoms of a dangerous change in heartbeat or heart rhythm like chest pain; dizziness; fast or irregular heartbeat; palpitations; feeling faint or lightheaded, falls;  breathing problems signs of decreased platelets or bleeding - bruising, pinpoint red spots on the skin, black, tarry stools, blood in the urine signs of decreased red blood cells - unusually weak or tired, feeling faint or lightheaded, falls signs of infection - fever or chills, cough, sore throat, pain or difficulty passing urine signs and symptoms of kidney injury like trouble passing urine or change in the amount of urine signs and symptoms of liver injury like dark yellow or brown urine; general ill feeling or flu-like symptoms; light-colored stools; loss of appetite; nausea; right upper belly pain; unusually weak or tired; yellowing of the eyes or skin swelling of ankles, feet, hands Side effects that usually do not require medical attention (report to your doctor or health care professional if they continue or are bothersome): constipation diarrhea hair loss loss of appetite nausea rash vomiting This list may not describe all possible side effects. Call your doctor for medical advice about side effects. You may report side effects to FDA at 1-800-FDA-1088. Where should I keep my medication? This drug is given in a hospital or clinic and will not be stored at home. NOTE: This sheet is a summary. It may not cover all possible information. If you have questions about this medicine, talk to your doctor, pharmacist, or health care provider.  2022 [employer]vier/Gold Standard (2018-02-22 18:06:11)

## 2021-10-17 LAB — CANCER ANTIGEN 19-9: CA 19-9: 16156 U/mL — ABNORMAL HIGH (ref 0–35)

## 2021-10-19 ENCOUNTER — Telehealth: Payer: Self-pay | Admitting: *Deleted

## 2021-10-19 NOTE — Telephone Encounter (Signed)
Scheduled CT scan at [facility] radiology location on 11/16 at @ 2:30 pm with 2:15 arrival time. NPO after 1030 and drink contrast at 12:30 and 1:30 pm. Message left on voice mail and Mychart. Port access for scan rescheduled to 1:45 pm. Requested return message to confirm.

## 2021-10-22 ENCOUNTER — Inpatient Hospital Stay: Payer: Medicaid Other

## 2021-10-22 ENCOUNTER — Inpatient Hospital Stay: Payer: Medicaid Other | Admitting: Oncology

## 2021-10-25 ENCOUNTER — Other Ambulatory Visit: Payer: Self-pay | Admitting: Oncology

## 2021-10-27 ENCOUNTER — Telehealth: Payer: Self-pay

## 2021-10-27 NOTE — Telephone Encounter (Signed)
Called the patient to confirmed her scan for 11/16 no answer. I left a voice mail  to call back to confirm her appointment

## 2021-10-27 NOTE — Telephone Encounter (Signed)
Spoke with Iran Sizer to confirm [facility]'s scan and he stated she will be there

## 2021-10-28 ENCOUNTER — Inpatient Hospital Stay: Payer: Medicaid Other

## 2021-10-28 ENCOUNTER — Encounter (HOSPITAL_BASED_OUTPATIENT_CLINIC_OR_DEPARTMENT_OTHER): Payer: Self-pay

## 2021-10-28 ENCOUNTER — Ambulatory Visit (HOSPITAL_BASED_OUTPATIENT_CLINIC_OR_DEPARTMENT_OTHER)
Admission: RE | Admit: 2021-10-28 | Discharge: 2021-10-28 | Disposition: A | Discharge status: Home / Self Care | Payer: Medicaid Other | Source: Ambulatory Visit | Attending: Oncology | Admitting: Oncology

## 2021-10-28 ENCOUNTER — Other Ambulatory Visit: Payer: Self-pay

## 2021-10-28 DIAGNOSIS — C250 Malignant neoplasm of head of pancreas: Secondary | ICD-10-CM | POA: Insufficient documentation

## 2021-10-28 MED ORDER — IOHEXOL 350 MG/ML SOLN: 75.0000 mL | Freq: Once | INTRAVENOUS | Status: DC | PRN

## 2021-10-28 MED ORDER — IOHEXOL 350 MG/ML SOLN
60.0000 mL | Freq: Once | INTRAVENOUS | Status: AC | PRN
  Administered 2021-10-28: 60 mL via INTRAVENOUS

## 2021-10-28 MED ORDER — SODIUM CHLORIDE 0.9% FLUSH
10.0000 mL | INTRAVENOUS | Status: DC | PRN
  Filled 2021-10-28: qty 10

## 2021-10-28 MED ORDER — HEPARIN SOD (PORK) LOCK FLUSH 100 UNIT/ML IV SOLN
500.0000 [IU] | Freq: Once | INTRAVENOUS | Status: AC
  Administered 2021-10-28: 500 [IU] via INTRAVENOUS

## 2021-10-30 ENCOUNTER — Inpatient Hospital Stay: Payer: Medicaid Other | Admitting: Nurse Practitioner

## 2021-10-30 ENCOUNTER — Inpatient Hospital Stay: Payer: Medicaid Other

## 2021-10-30 ENCOUNTER — Telehealth: Payer: Self-pay | Admitting: Oncology

## 2021-10-30 NOTE — Telephone Encounter (Signed)
Called patient per 11/18 sch msg - left message for patient to call back to reschedule missed appt on 11/18.

## 2021-11-03 ENCOUNTER — Inpatient Hospital Stay: Payer: Medicaid Other

## 2021-11-03 ENCOUNTER — Inpatient Hospital Stay (HOSPITAL_BASED_OUTPATIENT_CLINIC_OR_DEPARTMENT_OTHER): Payer: Medicaid Other | Admitting: Nurse Practitioner

## 2021-11-03 ENCOUNTER — Other Ambulatory Visit: Payer: Self-pay

## 2021-11-03 VITALS — BP 134/85 | HR 83 | Temp 98.1°F | Resp 18

## 2021-11-03 VITALS — BP 115/78 | HR 88 | Temp 98.1°F | Resp 20 | Ht 64.0 in | Wt 183.6 lb

## 2021-11-03 DIAGNOSIS — Z5111 Encounter for antineoplastic chemotherapy: Secondary | ICD-10-CM | POA: Diagnosis not present

## 2021-11-03 DIAGNOSIS — C250 Malignant neoplasm of head of pancreas: Secondary | ICD-10-CM | POA: Diagnosis not present

## 2021-11-03 DIAGNOSIS — C259 Malignant neoplasm of pancreas, unspecified: Secondary | ICD-10-CM | POA: Diagnosis not present

## 2021-11-03 LAB — CBC WITH DIFFERENTIAL (CANCER CENTER ONLY)
Abs Immature Granulocytes: 0.01 10*3/uL (ref 0.00–0.07)
Basophils Absolute: 0 10*3/uL (ref 0.0–0.1)
Basophils Relative: 0 %
Eosinophils Absolute: 0.3 10*3/uL (ref 0.0–0.5)
Eosinophils Relative: 7 %
HCT: 31.7 % — ABNORMAL LOW (ref 36.0–46.0)
Hemoglobin: 10.4 g/dL — ABNORMAL LOW (ref 12.0–15.0)
Immature Granulocytes: 0 %
Lymphocytes Relative: 22 %
Lymphs Abs: 1 10*3/uL (ref 0.7–4.0)
MCH: 31.2 pg (ref 26.0–34.0)
MCHC: 32.8 g/dL (ref 30.0–36.0)
MCV: 95.2 fL (ref 80.0–100.0)
Monocytes Absolute: 0.4 10*3/uL (ref 0.1–1.0)
Monocytes Relative: 9 %
Neutro Abs: 2.7 10*3/uL (ref 1.7–7.7)
Neutrophils Relative %: 62 %
Platelet Count: 324 10*3/uL (ref 150–400)
RBC: 3.33 MIL/uL — ABNORMAL LOW (ref 3.87–5.11)
RDW: 15.9 % — ABNORMAL HIGH (ref 11.5–15.5)
WBC Count: 4.5 10*3/uL (ref 4.0–10.5)
nRBC: 0 % (ref 0.0–0.2)

## 2021-11-03 LAB — CMP (CANCER CENTER ONLY)
ALT: 9 U/L (ref 0–44)
AST: 21 U/L (ref 15–41)
Albumin: 3.4 g/dL — ABNORMAL LOW (ref 3.5–5.0)
Alkaline Phosphatase: 85 U/L (ref 38–126)
Anion gap: 7 (ref 5–15)
BUN: 9 mg/dL (ref 6–20)
CO2: 30 mmol/L (ref 22–32)
Calcium: 9.7 mg/dL (ref 8.9–10.3)
Chloride: 105 mmol/L (ref 98–111)
Creatinine: 0.87 mg/dL (ref 0.44–1.00)
GFR, Estimated: >60 mL/min (ref >60)
Glucose, Bld: 99 mg/dL (ref 70–99)
Potassium: 3.5 mmol/L (ref 3.5–5.1)
Sodium: 142 mmol/L (ref 135–145)
Total Bilirubin: 0.5 mg/dL (ref 0.3–1.2)
Total Protein: 5.8 g/dL — ABNORMAL LOW (ref 6.5–8.1)

## 2021-11-03 MED ORDER — HEPARIN SOD (PORK) LOCK FLUSH 100 UNIT/ML IV SOLN
500.0000 [IU] | Freq: Once | INTRAVENOUS | Status: AC | PRN
  Administered 2021-11-03: 500 [IU]

## 2021-11-03 MED ORDER — PROCHLORPERAZINE MALEATE 10 MG PO TABS
10.0000 mg | ORAL_TABLET | Freq: Once | ORAL | Status: AC
  Administered 2021-11-03: 10 mg via ORAL
  Filled 2021-11-03: qty 1

## 2021-11-03 MED ORDER — SODIUM CHLORIDE 0.9 % IV SOLN
1000.0000 mg/m2 | Freq: Once | INTRAVENOUS | Status: AC
  Administered 2021-11-03: 1938 mg via INTRAVENOUS
  Filled 2021-11-03: qty 50.97

## 2021-11-03 MED ORDER — SODIUM CHLORIDE 0.9 % IV SOLN: Freq: Once | INTRAVENOUS | Status: AC

## 2021-11-03 MED ORDER — MORPHINE SULFATE ER 60 MG PO TBCR: 60.0000 mg | EXTENDED_RELEASE_TABLET | Freq: Two times a day (BID) | ORAL | 0 refills | Status: DC

## 2021-11-03 MED ORDER — PACLITAXEL PROTEIN-BOUND CHEMO INJECTION 100 MG
100.0000 mg/m2 | Freq: Once | INTRAVENOUS | Status: AC
  Administered 2021-11-03: 200 mg via INTRAVENOUS
  Filled 2021-11-03: qty 40

## 2021-11-03 MED ORDER — MORPHINE SULFATE (CONCENTRATE) 10 MG/0.5ML PO SOLN: 10.0000 mg | ORAL | 0 refills | Status: DC | PRN

## 2021-11-03 MED ORDER — ZOLPIDEM TARTRATE 10 MG PO TABS: 10.0000 mg | ORAL_TABLET | Freq: Every evening | ORAL | 0 refills | Status: DC | PRN

## 2021-11-03 MED ORDER — SODIUM CHLORIDE 0.9% FLUSH
10.0000 mL | INTRAVENOUS | Status: DC | PRN
  Administered 2021-11-03: 10 mL

## 2021-11-03 NOTE — Progress Notes (Signed)
Patient presents for treatment. RN assessment completed along with the following:  Labs/vitals reviewed - Yes, and within treatment parameters.   Weight within 10% of previous measurement - Yes Oncology Treatment Attestation completed for current therapy- Yes, on date 07/07/21 Informed consent completed and reflects current therapy/intent - Yes, on date 07/30/21             Provider progress note reviewed - Today's provider note is not yet available. I reviewed the most recent oncology provider progress note in chart dated 10/16/21. Treatment/Antibody/Supportive plan reviewed - Yes, and there are no adjustments needed for today's treatment. S&H and other orders reviewed - Yes, and there are no additional orders identified. Previous treatment date reviewed - Yes, and the appropriate amount of time has elapsed between treatments. Clinic Hand Off Received from - Ned Card, NP  Patient to proceed with treatment.

## 2021-11-03 NOTE — Progress Notes (Signed)
[place] OFFICE PROGRESS NOTE   Diagnosis: Pancreas cancer  INTERVAL HISTORY:   Ms. Dominique returns for follow-up.  She completed another cycle of gemcitabine 10/16/2021.  She tends to note increased nausea for about 2 days after each chemotherapy.  No mouth sores.  No diarrhea.  Intermittent constipation.  Abdominal pain is unchanged.  She has persistent numbness/tingling in the toes and has recently noted mild numbness/tingling in the fingertips.  Objective:  Vital signs in last 24 hours:  Blood pressure 115/78, pulse 88, temperature 98.1 F (36.7 C), temperature source Oral, resp. rate 20, height 5' 4" (1.626 m), weight 183 lb 9.6 oz (83.3 kg), SpO2 100 %.    HEENT: No thrush or ulcers. Resp: Lungs clear bilaterally. Cardio: Regular rate and rhythm. GI: Abdomen soft.  Mild diffuse tenderness.  No hepatomegaly.  No mass. Vascular: No leg edema. Neuro: Vibratory sense with mild to moderate decrease in the fingertips bilaterally. Port-A-Cath without erythema.  Lab Results:  Lab Results  Component Value Date   WBC 4.5 11/03/2021   HGB 10.4 (L) 11/03/2021   HCT 31.7 (L) 11/03/2021   MCV 95.2 11/03/2021   PLT 324 11/03/2021   NEUTROABS 2.7 11/03/2021    Imaging:  No results found.  Medications: I have reviewed the patient's current medications.  Assessment/Plan: Pancreas cancer -CT abdomen/pelvis without contrast 02/16/2021-multiple large hypoechoic masses in the patient's liver consistent metastatic disease, ill-defined masslike area in the pancreatic head measuring approximate 4.7 cm, possible filling defect in the right ventricle. -EGD 02/19/2021-large infiltrative mass with bleeding in the second portion of the duodenum, appears to be arising near the ampulla, partially obstructive with friable mucosa.  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 -Flexible sigmoidoscopy 02/19/2021-diverticulosis in the sigmoid colon,  nonbleeding external and internal hemorrhoids -Cardiac CT 02/20/2021-mass in the mid RV attached to the interventricular septum measuring 16 mm x 6 mm and concerning for metastatic tumor -MRI abdomen with/without contrast 02/21/2021-mass in the posterior pancreatic head measuring 4.3 x 3.8 cm with obstruction of the central pancreatic duct and diffuse dilatation up to 6 mm consistent with pancreatic adenocarcinoma, liver lesions the largest mass of the central left lobe measuring 9.9 x 9.2 cm consistent with hepatic metastatic disease,  hepatomegaly. -Ultrasound-guided biopsy of a left liver lesion 02/24/2021-adenocarcinoma, morphologically similar to the duodenal biopsy; microsatellite stable, tumor mutation burden 1 -Negative genetic testing -Palliative radiation to the duodenal mass 02/25/2021-03/06/2021 -Cycle 1 FOLFOX 03/17/2021 -Cycle 2 FOLFOX 04/01/2021 -Cycle 3 FOLFIRINOX 04/14/2021 -Cycle 4 FOLFIRINOX 04/27/2021, Udenyca added -Cycle 5 FOLFIRINOX 05/12/2021, Udenyca -MRI abdomen 05/23/2021-decrease in size of pancreas mass and hepatic lesions, no progressive disease -Cycle 6 FOLFIRINOX 05/27/2021 -CT 06/17/2021-multiple liver metastases similar to her prior exam.  Pancreas mass not significantly changed. -CT 06/26/2021-stable pancreas mass, no significant change in size and number of known liver metastases. -CT abdomen/pelvis 07/12/2021-new gastrostomy tube.  Multiple liver masses slightly decreased in size from prior exam.  Pancreas head mass unchanged. -Cycle 1 gemcitabine/Abraxane 07/30/2021 -Cycle 2 gemcitabine/Abraxane 08/13/2021 -Cycle 3 gemcitabine 08/27/2021, Abraxane held due to significant bilateral toe pain, question neuropathy -Cycle 4 gemcitabine, Abraxane held due to neuropathy, 09/10/2021 -Cycle 5 gemcitabine, Abraxane held due to neuropathy, 09/24/2021 -Cycle 6 gemcitabine, Abraxane held due to neuropathy 10/16/2021 -CTs 10/29/2021-mild progression of multifocal liver metastases; mild increase  in size of hypoenhancing mass within the head of the pancreas; new mild pleural nodularity along the posterior lower lobes -Cycle 7 gemcitabine/Abraxane 11/03/2021 2.  Iron deficiency anemia-improved -Feraheme 510 mg 02/20/2021  3.  Protein calorie malnutrition 4.  Possible right ventricular filling defect on noncontrast CT scan MRI cardiac morphology 02/21/2021-mass in the mid RV attached to the interventricular septum measures 16 mm x 6 mm.  Mass appears heterogeneous with area of enhancement on LGE imaging.  Mass is concerning for metastatic tumor.  Normal LV size and systolic function.  Normal RV size and systolic function. 5.  Hypertension 6.  History of CVA 7.  Hyperlipidemia 8.  Hypothyroidism 9.  Morbid obesity 10.  Asthma 11.  Migraines 12.  Pain secondary to #1 13.  Port-A-Cath placement 02/25/2021 14.  Hypokalemia 04/01/2021-started a potassium supplement 15.  Hospital admission 06/17/2021-intractable nausea and vomiting EGD 06/19/2021-diffuse gastritis, nonobstructing duodenal mass, biopsy of stomach-no malignancy or H. pylori, duodenal mass-adenocarcinoma 16.  Hospital admission 07/12/2021 with nausea/vomiting, abdominal pain, and hypokalemia 17.  Percutaneous gastrostomy tube placement 07/01/2021  Disposition: Ms. Person appears unchanged.  Recent restaging CTs show mild progression of liver metastases.  She has been receiving gemcitabine alone.  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  The neuropathy symptoms may be related to previous oxaliplatin.  She agrees to resume Abraxane with treatment today.  She understands the neuropathy symptoms could worsen.  She is willing to accept this risk.  Plan to proceed with gemcitabine/Abraxane today as scheduled.  CBC and chemistry panel reviewed.  Labs adequate to proceed as above.  She will return for lab, follow-up, Gemcitabine/Abraxane in 2 weeks.  She will contact the office in the interim with any problems.  Patient seen  with Dr. Benay Spice.  CT images reviewed on the computer with Ms. Mcenery and her family member.    [name]   11/03/2021  11:06 AM  This was a shared visit with Ned Card.  We reviewed the CT findings and images with Ms. Enloe.  There appears to be mild progression of the metastatic pancreas cancer.  We discussed treatment options.  She agrees to resume Abraxane.  She understands potential for worsening of neuropathy with Abraxane treatment.  She will be referred to [facility] to consider salvage treatment options and eligibility for a clinical trial.  I was present for greater than 50% of today's visit.  I performed medical decision making.  Julieanne Manson, MD

## 2021-11-03 NOTE — Patient Instructions (Signed)
[facility]  Discharge Instructions: Thank you for choosing [facility] to provide your oncology and hematology care.   If you have a lab appointment with the [facility], please go directly to the [facility] and check in at the registration area.   Wear comfortable clothing and clothing appropriate for easy access to any Portacath or PICC line.   We strive to give you quality time with your provider. You may need to reschedule your appointment if you arrive late (15 or more minutes).  Arriving late affects you and other patients whose appointments are after yours.  Also, if you miss three or more appointments without notifying the office, you may be dismissed from the clinic at the provider's discretion.      For prescription refill requests, have your pharmacy contact our office and allow 72 hours for refills to be completed.    Today you received the following chemotherapy and/or immunotherapy agents: Abraxane, Gemzar     To help prevent nausea and vomiting after your treatment, we encourage you to take your nausea medication as directed.  BELOW ARE SYMPTOMS THAT SHOULD BE REPORTED IMMEDIATELY: *FEVER GREATER THAN 100.4 F (38 C) OR HIGHER *CHILLS OR SWEATING *NAUSEA AND VOMITING THAT IS NOT CONTROLLED WITH YOUR NAUSEA MEDICATION *UNUSUAL SHORTNESS OF BREATH *UNUSUAL BRUISING OR BLEEDING *URINARY PROBLEMS (pain or burning when urinating, or frequent urination) *BOWEL PROBLEMS (unusual diarrhea, constipation, pain near the anus) TENDERNESS IN MOUTH AND THROAT WITH OR WITHOUT PRESENCE OF ULCERS (sore throat, sores in mouth, or a toothache) UNUSUAL RASH, SWELLING OR PAIN  UNUSUAL VAGINAL DISCHARGE OR ITCHING   Items with * indicate a potential emergency and should be followed up as soon as possible or go to the Emergency Department if any problems should occur.  Please show the CHEMOTHERAPY ALERT CARD or IMMUNOTHERAPY ALERT CARD at check-in  to the Emergency Department and triage nurse.  Should you have questions after your visit or need to cancel or reschedule your appointment, please contact [facility]  Dept: [phone]  and follow the prompts.  Office hours are 8:00 a.m. to 4:30 p.m. Monday - Friday. Please note that voicemails left after 4:00 p.m. may not be returned until the following business day.  We are closed weekends and major holidays. You have access to a nurse at all times for urgent questions. Please call the main number to the clinic Dept: [phone] and follow the prompts.   For any non-urgent questions, you may also contact your provider using MyChart. We now offer e-Visits for anyone 49 and older to request care online for non-urgent symptoms. For details visit [email or web address].   Also download the MyChart app! Go to the app store, search "MyChart", open the app, select [facility], and log in with your MyChart username and password.  Due to Covid, a mask is required upon entering the hospital/clinic. If you do not have a mask, one will be given to you upon arrival. For doctor visits, patients may have 1 support person aged 12 or older with them. For treatment visits, patients cannot have anyone with them due to current Covid guidelines and our immunocompromised population.   Gemcitabine injection What is this medication? GEMCITABINE (jem SYE ta been) is a chemotherapy drug. This medicine is used to treat many types of cancer like breast cancer, lung cancer, pancreatic cancer, and ovarian cancer. This medicine may be used for other purposes; ask your health care provider or  pharmacist if you have questions. COMMON BRAND NAME(S): Gemzar, Infugem What should I tell my care team before I take this medication? They need to know if you have any of these conditions: blood disorders infection kidney disease liver disease lung or breathing disease, like asthma recent or ongoing  radiation therapy an unusual or allergic reaction to gemcitabine, other chemotherapy, other medicines, foods, dyes, or preservatives pregnant or trying to get pregnant breast-feeding How should I use this medication? This drug is given as an infusion into a vein. It is administered in a hospital or clinic by a specially trained health care professional. Talk to your pediatrician regarding the use of this medicine in children. Special care may be needed. Overdosage: If you think you have taken too much of this medicine contact a poison control center or emergency room at once. NOTE: This medicine is only for you. Do not share this medicine with others. What if I miss a dose? It is important not to miss your dose. Call your doctor or health care professional if you are unable to keep an appointment. What may interact with this medication? medicines to increase blood counts like filgrastim, pegfilgrastim, sargramostim some other chemotherapy drugs like cisplatin vaccines Talk to your doctor or health care professional before taking any of these medicines: acetaminophen aspirin ibuprofen ketoprofen naproxen This list may not describe all possible interactions. Give your health care provider a list of all the medicines, herbs, non-prescription drugs, or dietary supplements you use. Also tell them if you smoke, drink alcohol, or use illegal drugs. Some items may interact with your medicine. What should I watch for while using this medication? Visit your doctor for checks on your progress. This drug may make you feel generally unwell. This is not uncommon, as chemotherapy can affect healthy cells as well as cancer cells. Report any side effects. Continue your course of treatment even though you feel ill unless your doctor tells you to stop. In some cases, you may be given additional medicines to help with side effects. Follow all directions for their use. Call your doctor or health care  professional for advice if you get a fever, chills or sore throat, or other symptoms of a cold or flu. Do not treat yourself. This drug decreases your body's ability to fight infections. Try to avoid being around people who are sick. This medicine may increase your risk to bruise or bleed. Call your doctor or health care professional if you notice any unusual bleeding. Be careful brushing and flossing your teeth or using a toothpick because you may get an infection or bleed more easily. If you have any dental work done, tell your dentist you are receiving this medicine. Avoid taking products that contain aspirin, acetaminophen, ibuprofen, naproxen, or ketoprofen unless instructed by your doctor. These medicines may hide a fever. Do not become pregnant while taking this medicine or for 6 months after stopping it. Women should inform their doctor if they wish to become pregnant or think they might be pregnant. Men should not father a child while taking this medicine and for 3 months after stopping it. There is a potential for serious side effects to an unborn child. Talk to your health care professional or pharmacist for more information. Do not breast-feed an infant while taking this medicine or for at least 1 week after stopping it. Men should inform their doctors if they wish to father a child. This medicine may lower sperm counts. Talk with your doctor or health care  professional if you are concerned about your fertility. What side effects may I notice from receiving this medication? Side effects that you should report to your doctor or health care professional as soon as possible: allergic reactions like skin rash, itching or hives, swelling of the face, lips, or tongue breathing problems pain, redness, or irritation at site where injected signs and symptoms of a dangerous change in heartbeat or heart rhythm like chest pain; dizziness; fast or irregular heartbeat; palpitations; feeling faint or  lightheaded, falls; breathing problems signs of decreased platelets or bleeding - bruising, pinpoint red spots on the skin, black, tarry stools, blood in the urine signs of decreased red blood cells - unusually weak or tired, feeling faint or lightheaded, falls signs of infection - fever or chills, cough, sore throat, pain or difficulty passing urine signs and symptoms of kidney injury like trouble passing urine or change in the amount of urine signs and symptoms of liver injury like dark yellow or brown urine; general ill feeling or flu-like symptoms; light-colored stools; loss of appetite; nausea; right upper belly pain; unusually weak or tired; yellowing of the eyes or skin swelling of ankles, feet, hands Side effects that usually do not require medical attention (report to your doctor or health care professional if they continue or are bothersome): constipation diarrhea hair loss loss of appetite nausea rash vomiting This list may not describe all possible side effects. Call your doctor for medical advice about side effects. You may report side effects to FDA at 1-800-FDA-1088. Where should I keep my medication? This drug is given in a hospital or clinic and will not be stored at home. NOTE: This sheet is a summary. It may not cover all possible information. If you have questions about this medicine, talk to your doctor, pharmacist, or health care provider.  2022 Elsevier/Gold Standard (2018-02-22 18:06:11)  Nanoparticle Albumin-Bound Paclitaxel injection What is this medication? NANOPARTICLE ALBUMIN-BOUND PACLITAXEL (Na no PAHR ti kuhl  al BYOO muhn-bound  PAK li TAX el) is a chemotherapy drug. It targets fast dividing cells, like cancer cells, and causes these cells to die. This medicine is used to treat advanced breast cancer, lung cancer, and pancreatic cancer. This medicine may be used for other purposes; ask your health care provider or pharmacist if you have questions. COMMON  BRAND NAME(S): Abraxane What should I tell my care team before I take this medication? They need to know if you have any of these conditions: kidney disease liver disease low blood counts, like low white cell, platelet, or red cell counts lung or breathing disease, like asthma tingling of the fingers or toes, or other nerve disorder an unusual or allergic reaction to paclitaxel, albumin, other chemotherapy, other medicines, foods, dyes, or preservatives pregnant or trying to get pregnant breast-feeding How should I use this medication? This drug is given as an infusion into a vein. It is administered in a hospital or clinic by a specially trained health care professional. Talk to your pediatrician regarding the use of this medicine in children. Special care may be needed. Overdosage: If you think you have taken too much of this medicine contact a poison control center or emergency room at once. NOTE: This medicine is only for you. Do not share this medicine with others. What if I miss a dose? It is important not to miss your dose. Call your doctor or health care professional if you are unable to keep an appointment. What may interact with this medication? This medicine may  interact with the following medications: antiviral medicines for hepatitis, HIV or AIDS certain antibiotics like erythromycin and clarithromycin certain medicines for fungal infections like ketoconazole and itraconazole certain medicines for seizures like carbamazepine, phenobarbital, phenytoin gemfibrozil nefazodone rifampin St. John's wort This list may not describe all possible interactions. Give your health care provider a list of all the medicines, herbs, non-prescription drugs, or dietary supplements you use. Also tell them if you smoke, drink alcohol, or use illegal drugs. Some items may interact with your medicine. What should I watch for while using this medication? Your condition will be monitored carefully  while you are receiving this medicine. You will need important blood work done while you are taking this medicine. This medicine can cause serious allergic reactions. If you experience allergic reactions like skin rash, itching or hives, swelling of the face, lips, or tongue, tell your doctor or health care professional right away. In some cases, you may be given additional medicines to help with side effects. Follow all directions for their use. This drug may make you feel generally unwell. This is not uncommon, as chemotherapy can affect healthy cells as well as cancer cells. Report any side effects. Continue your course of treatment even though you feel ill unless your doctor tells you to stop. Call your doctor or health care professional for advice if you get a fever, chills or sore throat, or other symptoms of a cold or flu. Do not treat yourself. This drug decreases your body's ability to fight infections. Try to avoid being around people who are sick. This medicine may increase your risk to bruise or bleed. Call your doctor or health care professional if you notice any unusual bleeding. Be careful brushing and flossing your teeth or using a toothpick because you may get an infection or bleed more easily. If you have any dental work done, tell your dentist you are receiving this medicine. Avoid taking products that contain aspirin, acetaminophen, ibuprofen, naproxen, or ketoprofen unless instructed by your doctor. These medicines may hide a fever. Do not become pregnant while taking this medicine or for 6 months after stopping it. Women should inform their doctor if they wish to become pregnant or think they might be pregnant. Men should not father a child while taking this medicine or for 3 months after stopping it. There is a potential for serious side effects to an unborn child. Talk to your health care professional or pharmacist for more information. Do not breast-feed an infant while taking this  medicine or for 2 weeks after stopping it. This medicine may interfere with the ability to get pregnant or to father a child. You should talk to your doctor or health care professional if you are concerned about your fertility. What side effects may I notice from receiving this medication? Side effects that you should report to your doctor or health care professional as soon as possible: allergic reactions like skin rash, itching or hives, swelling of the face, lips, or tongue breathing problems changes in vision fast, irregular heartbeat low blood pressure mouth sores pain, tingling, numbness in the hands or feet signs of decreased platelets or bleeding - bruising, pinpoint red spots on the skin, black, tarry stools, blood in the urine signs of decreased red blood cells - unusually weak or tired, feeling faint or lightheaded, falls signs of infection - fever or chills, cough, sore throat, pain or difficulty passing urine signs and symptoms of liver injury like dark yellow or brown urine; general ill feeling  or flu-like symptoms; light-colored stools; loss of appetite; nausea; right upper belly pain; unusually weak or tired; yellowing of the eyes or skin swelling of the ankles, feet, hands unusually slow heartbeat Side effects that usually do not require medical attention (report to your doctor or health care professional if they continue or are bothersome): diarrhea hair loss loss of appetite nausea, vomiting tiredness This list may not describe all possible side effects. Call your doctor for medical advice about side effects. You may report side effects to FDA at 1-800-FDA-1088. Where should I keep my medication? This drug is given in a hospital or clinic and will not be stored at home. NOTE: This sheet is a summary. It may not cover all possible information. If you have questions about this medicine, talk to your doctor, pharmacist, or health care provider.  2022 Elsevier/Gold Standard  (2017-08-02 00:00:00)

## 2021-11-03 NOTE — Patient Instructions (Signed)

## 2021-11-04 ENCOUNTER — Encounter: Payer: Self-pay | Admitting: *Deleted

## 2021-11-04 ENCOUNTER — Inpatient Hospital Stay: Payer: Medicaid Other

## 2021-11-04 ENCOUNTER — Inpatient Hospital Stay: Payer: Medicaid Other | Admitting: Nurse Practitioner

## 2021-11-04 ENCOUNTER — Inpatient Hospital Stay: Payer: Medicaid Other | Admitting: Nutrition

## 2021-11-04 LAB — CANCER ANTIGEN 19-9: CA 19-9: 16399 U/mL — ABNORMAL HIGH (ref 0–35)

## 2021-11-04 NOTE — Progress Notes (Signed)
Faxed referral order, demographics, chart information to [facility] for referral to Dr. Altamease Oiler. Notified radiology to push over last two CT scans to [facility] system.

## 2021-11-09 ENCOUNTER — Other Ambulatory Visit: Payer: Self-pay | Admitting: Nurse Practitioner

## 2021-11-09 ENCOUNTER — Telehealth: Payer: Self-pay | Admitting: Surgery

## 2021-11-09 DIAGNOSIS — C250 Malignant neoplasm of head of pancreas: Secondary | ICD-10-CM

## 2021-11-09 MED ORDER — FLUCONAZOLE 50 MG PO TABS: 50.0000 mg | ORAL_TABLET | Freq: Every day | ORAL | 0 refills | Status: AC

## 2021-11-09 NOTE — Telephone Encounter (Signed)
Pt called in stating that she has thrush in her mouth.  She stated that she has white patches on her tongue and the roof of her mouth.  I notified Ned Card, NP, and she ordered Fluconazole 50 mg tablets.  The pt is to take 1 tablet daily for 7 days.  I called the pt back and she verbalized understanding of these instructions and was told to call our office back if she had any more concerns.

## 2021-11-11 ENCOUNTER — Telehealth: Payer: Self-pay | Admitting: *Deleted

## 2021-11-11 NOTE — Telephone Encounter (Signed)
Call to Dr. Randell Loop coordinator to f/u on referral placed on 11/23. She reports patient has been called on 11/23 and 11/29 and did not answer or return call.  Called patient and gave her phone # for coordinator to make appointment [phone]. Patient agrees to call. She did not answer their call due to unrecognized number.

## 2021-11-14 ENCOUNTER — Other Ambulatory Visit: Payer: Self-pay | Admitting: Oncology

## 2021-11-18 ENCOUNTER — Telehealth: Payer: Self-pay | Admitting: *Deleted

## 2021-11-18 ENCOUNTER — Inpatient Hospital Stay: Payer: Medicaid Other | Admitting: Nurse Practitioner

## 2021-11-18 ENCOUNTER — Inpatient Hospital Stay: Payer: Medicaid Other

## 2021-11-18 NOTE — Telephone Encounter (Signed)
Wendy Hoffman called back and reports she is well. Thought her appointments were tomorrow. She would like to come tomorrow or Monday. High priority scheduling message sent for reschedule.

## 2021-11-18 NOTE — Telephone Encounter (Signed)
Wendy Hoffman was "no show" for her appointments today. Called and left VM requesting return call to let us know how she is and when she is willing to reschedule.

## 2021-11-19 ENCOUNTER — Telehealth: Payer: Self-pay | Admitting: Oncology

## 2021-11-19 NOTE — Telephone Encounter (Signed)
Rescheduled appt per 12/7 sch msg - patient is aware of appt on 11/23/21

## 2021-11-22 ENCOUNTER — Other Ambulatory Visit: Payer: Self-pay | Admitting: Oncology

## 2021-11-22 DIAGNOSIS — C250 Malignant neoplasm of head of pancreas: Secondary | ICD-10-CM

## 2021-11-22 DIAGNOSIS — J4540 Moderate persistent asthma, uncomplicated: Secondary | ICD-10-CM

## 2021-11-23 ENCOUNTER — Other Ambulatory Visit: Payer: Self-pay

## 2021-11-23 ENCOUNTER — Inpatient Hospital Stay (HOSPITAL_BASED_OUTPATIENT_CLINIC_OR_DEPARTMENT_OTHER): Payer: Medicaid Other | Admitting: Nurse Practitioner

## 2021-11-23 ENCOUNTER — Inpatient Hospital Stay: Payer: Medicaid Other | Attending: Nurse Practitioner

## 2021-11-23 ENCOUNTER — Encounter: Payer: Self-pay | Admitting: Nurse Practitioner

## 2021-11-23 ENCOUNTER — Inpatient Hospital Stay: Payer: Medicaid Other

## 2021-11-23 VITALS — BP 143/96 | HR 92 | Temp 97.8°F | Resp 18 | Ht 64.0 in | Wt 184.0 lb

## 2021-11-23 VITALS — BP 166/93 | HR 77 | Temp 98.0°F | Resp 18

## 2021-11-23 DIAGNOSIS — C259 Malignant neoplasm of pancreas, unspecified: Secondary | ICD-10-CM | POA: Diagnosis not present

## 2021-11-23 DIAGNOSIS — Z5111 Encounter for antineoplastic chemotherapy: Secondary | ICD-10-CM | POA: Diagnosis present

## 2021-11-23 DIAGNOSIS — C787 Secondary malignant neoplasm of liver and intrahepatic bile duct: Secondary | ICD-10-CM | POA: Diagnosis not present

## 2021-11-23 DIAGNOSIS — E876 Hypokalemia: Secondary | ICD-10-CM | POA: Diagnosis not present

## 2021-11-23 DIAGNOSIS — E46 Unspecified protein-calorie malnutrition: Secondary | ICD-10-CM | POA: Insufficient documentation

## 2021-11-23 DIAGNOSIS — Z8673 Personal history of transient ischemic attack (TIA), and cerebral infarction without residual deficits: Secondary | ICD-10-CM | POA: Insufficient documentation

## 2021-11-23 DIAGNOSIS — G43909 Migraine, unspecified, not intractable, without status migrainosus: Secondary | ICD-10-CM | POA: Insufficient documentation

## 2021-11-23 DIAGNOSIS — E785 Hyperlipidemia, unspecified: Secondary | ICD-10-CM | POA: Insufficient documentation

## 2021-11-23 DIAGNOSIS — E039 Hypothyroidism, unspecified: Secondary | ICD-10-CM | POA: Diagnosis not present

## 2021-11-23 DIAGNOSIS — D509 Iron deficiency anemia, unspecified: Secondary | ICD-10-CM | POA: Insufficient documentation

## 2021-11-23 DIAGNOSIS — J45909 Unspecified asthma, uncomplicated: Secondary | ICD-10-CM | POA: Diagnosis not present

## 2021-11-23 DIAGNOSIS — I10 Essential (primary) hypertension: Secondary | ICD-10-CM | POA: Insufficient documentation

## 2021-11-23 DIAGNOSIS — C250 Malignant neoplasm of head of pancreas: Secondary | ICD-10-CM | POA: Insufficient documentation

## 2021-11-23 LAB — CBC WITH DIFFERENTIAL (CANCER CENTER ONLY)
Abs Immature Granulocytes: 0.02 10*3/uL (ref 0.00–0.07)
Basophils Absolute: 0 10*3/uL (ref 0.0–0.1)
Basophils Relative: 0 %
Eosinophils Absolute: 0.2 10*3/uL (ref 0.0–0.5)
Eosinophils Relative: 4 %
HCT: 29.4 % — ABNORMAL LOW (ref 36.0–46.0)
Hemoglobin: 9.7 g/dL — ABNORMAL LOW (ref 12.0–15.0)
Immature Granulocytes: 0 %
Lymphocytes Relative: 25 %
Lymphs Abs: 1.3 10*3/uL (ref 0.7–4.0)
MCH: 31.8 pg (ref 26.0–34.0)
MCHC: 33 g/dL (ref 30.0–36.0)
MCV: 96.4 fL (ref 80.0–100.0)
Monocytes Absolute: 0.6 10*3/uL (ref 0.1–1.0)
Monocytes Relative: 11 %
Neutro Abs: 3 10*3/uL (ref 1.7–7.7)
Neutrophils Relative %: 60 %
Platelet Count: 339 10*3/uL (ref 150–400)
RBC: 3.05 MIL/uL — ABNORMAL LOW (ref 3.87–5.11)
RDW: 16.5 % — ABNORMAL HIGH (ref 11.5–15.5)
WBC Count: 5.1 10*3/uL (ref 4.0–10.5)
nRBC: 0 % (ref 0.0–0.2)

## 2021-11-23 LAB — CMP (CANCER CENTER ONLY)
ALT: 10 U/L (ref 0–44)
AST: 21 U/L (ref 15–41)
Albumin: 3.5 g/dL (ref 3.5–5.0)
Alkaline Phosphatase: 92 U/L (ref 38–126)
Anion gap: 6 (ref 5–15)
BUN: 14 mg/dL (ref 6–20)
CO2: 31 mmol/L (ref 22–32)
Calcium: 9.5 mg/dL (ref 8.9–10.3)
Chloride: 105 mmol/L (ref 98–111)
Creatinine: 0.77 mg/dL (ref 0.44–1.00)
GFR, Estimated: >60 mL/min (ref >60)
Glucose, Bld: 95 mg/dL (ref 70–99)
Potassium: 3.6 mmol/L (ref 3.5–5.1)
Sodium: 142 mmol/L (ref 135–145)
Total Bilirubin: 0.4 mg/dL (ref 0.3–1.2)
Total Protein: 6.2 g/dL — ABNORMAL LOW (ref 6.5–8.1)

## 2021-11-23 MED ORDER — PROCHLORPERAZINE MALEATE 10 MG PO TABS
10.0000 mg | ORAL_TABLET | Freq: Once | ORAL | Status: AC
  Administered 2021-11-23: 10 mg via ORAL
  Filled 2021-11-23: qty 1

## 2021-11-23 MED ORDER — MORPHINE SULFATE ER 60 MG PO TBCR: 60.0000 mg | EXTENDED_RELEASE_TABLET | Freq: Two times a day (BID) | ORAL | 0 refills | Status: DC

## 2021-11-23 MED ORDER — PACLITAXEL PROTEIN-BOUND CHEMO INJECTION 100 MG
100.0000 mg/m2 | Freq: Once | INTRAVENOUS | Status: AC
  Administered 2021-11-23: 200 mg via INTRAVENOUS
  Filled 2021-11-23: qty 40

## 2021-11-23 MED ORDER — SODIUM CHLORIDE 0.9% FLUSH
10.0000 mL | INTRAVENOUS | Status: DC | PRN
  Administered 2021-11-23: 10 mL

## 2021-11-23 MED ORDER — SODIUM CHLORIDE 0.9 % IV SOLN
1000.0000 mg/m2 | Freq: Once | INTRAVENOUS | Status: AC
  Administered 2021-11-23: 1938 mg via INTRAVENOUS
  Filled 2021-11-23: qty 50.97

## 2021-11-23 MED ORDER — MORPHINE SULFATE (CONCENTRATE) 10 MG/0.5ML PO SOLN: 10.0000 mg | ORAL | 0 refills | Status: DC | PRN

## 2021-11-23 MED ORDER — HEPARIN SOD (PORK) LOCK FLUSH 100 UNIT/ML IV SOLN
500.0000 [IU] | Freq: Once | INTRAVENOUS | Status: AC | PRN
  Administered 2021-11-23: 500 [IU]

## 2021-11-23 MED ORDER — SODIUM CHLORIDE 0.9 % IV SOLN: Freq: Once | INTRAVENOUS | Status: AC

## 2021-11-23 MED ORDER — ZOLPIDEM TARTRATE 10 MG PO TABS: 10.0000 mg | ORAL_TABLET | Freq: Every evening | ORAL | 0 refills | Status: DC | PRN

## 2021-11-23 NOTE — Telephone Encounter (Signed)
Pt request a refill through my chart

## 2021-11-23 NOTE — Progress Notes (Signed)
[facility] OFFICE PROGRESS NOTE   Diagnosis: Pancreas cancer  INTERVAL HISTORY:   Ms. Wendy Hoffman returns for follow-up.  She completed a cycle of gemcitabine/Abraxane 11/03/2021.  She had mild nausea.  No vomiting.  The nausea lasted about 3 days.  No mouth sores.  No diarrhea.  Pain is unchanged.  Stable neuropathy symptoms.  Objective:  Vital signs in last 24 hours:  Blood pressure (!) 143/96, pulse 92, temperature 97.8 F (36.6 C), temperature source Oral, resp. rate 18, height '5\' 4"'  (1.626 m), weight 184 lb (83.5 kg), SpO2 100 %.    HEENT: No thrush or ulcers. Resp: Lungs clear bilaterally. Cardio: Regular rate and rhythm. GI: Upper abdomen with mild tenderness. Vascular: No leg edema.  Port-A-Cath without erythema.  Lab Results:  Lab Results  Component Value Date   WBC 5.1 11/23/2021   HGB 9.7 (L) 11/23/2021   HCT 29.4 (L) 11/23/2021   MCV 96.4 11/23/2021   PLT 339 11/23/2021   NEUTROABS 3.0 11/23/2021    Imaging:  No results found.  Medications: I have reviewed the patient's current medications.  Assessment/Plan: Pancreas cancer -CT abdomen/pelvis without contrast 02/16/2021-multiple large hypoechoic masses in the patient's liver consistent metastatic disease, ill-defined masslike area in the pancreatic head measuring approximate 4.7 cm, possible filling defect in the right ventricle. -EGD 02/19/2021-large infiltrative mass with bleeding in the second portion of the duodenum, appears to be arising near the ampulla, partially obstructive with friable mucosa.  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 -Flexible sigmoidoscopy 02/19/2021-diverticulosis in the sigmoid colon, nonbleeding external and internal hemorrhoids -Cardiac CT 02/20/2021-mass in the mid RV attached to the interventricular septum measuring 16 mm x 6 mm and concerning for metastatic tumor -MRI abdomen with/without contrast 02/21/2021-mass in the posterior  pancreatic head measuring 4.3 x 3.8 cm with obstruction of the central pancreatic duct and diffuse dilatation up to 6 mm consistent with pancreatic adenocarcinoma, liver lesions the largest mass of the central left lobe measuring 9.9 x 9.2 cm consistent with hepatic metastatic disease,  hepatomegaly. -Ultrasound-guided biopsy of a left liver lesion 02/24/2021-adenocarcinoma, morphologically similar to the duodenal biopsy; microsatellite stable, tumor mutation burden 1 -Negative genetic testing -Palliative radiation to the duodenal mass 02/25/2021-03/06/2021 -Cycle 1 FOLFOX 03/17/2021 -Cycle 2 FOLFOX 04/01/2021 -Cycle 3 FOLFIRINOX 04/14/2021 -Cycle 4 FOLFIRINOX 04/27/2021, Udenyca added -Cycle 5 FOLFIRINOX 05/12/2021, Udenyca -MRI abdomen 05/23/2021-decrease in size of pancreas mass and hepatic lesions, no progressive disease -Cycle 6 FOLFIRINOX 05/27/2021 -CT 06/17/2021-multiple liver metastases similar to her prior exam.  Pancreas mass not significantly changed. -CT 06/26/2021-stable pancreas mass, no significant change in size and number of known liver metastases. -CT abdomen/pelvis 07/12/2021-new gastrostomy tube.  Multiple liver masses slightly decreased in size from prior exam.  Pancreas head mass unchanged. -Cycle 1 gemcitabine/Abraxane 07/30/2021 -Cycle 2 gemcitabine/Abraxane 08/13/2021 -Cycle 3 gemcitabine 08/27/2021, Abraxane held due to significant bilateral toe pain, question neuropathy -Cycle 4 gemcitabine, Abraxane held due to neuropathy, 09/10/2021 -Cycle 5 gemcitabine, Abraxane held due to neuropathy, 09/24/2021 -Cycle 6 gemcitabine, Abraxane held due to neuropathy 10/16/2021 -CTs 10/29/2021-mild progression of multifocal liver metastases; mild increase in size of hypoenhancing mass within the head of the pancreas; new mild pleural nodularity along the posterior lower lobes -Cycle 7 gemcitabine/Abraxane 11/03/2021 -Cycle 8 gemcitabine/Abraxane 11/23/2021 2.  Iron deficiency anemia-improved -Feraheme  510 mg 02/20/2021 3.  Protein calorie malnutrition 4.  Possible right ventricular filling defect on noncontrast CT scan MRI cardiac morphology 02/21/2021-mass in the mid RV attached to the interventricular septum measures 16  mm x 6 mm.  Mass appears heterogeneous with area of enhancement on LGE imaging.  Mass is concerning for metastatic tumor.  Normal LV size and systolic function.  Normal RV size and systolic function. 5.  Hypertension 6.  History of CVA 7.  Hyperlipidemia 8.  Hypothyroidism 9.  Morbid obesity 10.  Asthma 11.  Migraines 12.  Pain secondary to #1 13.  Port-A-Cath placement 02/25/2021 14.  Hypokalemia 04/01/2021-started a potassium supplement 15.  Hospital admission 06/17/2021-intractable nausea and vomiting EGD 06/19/2021-diffuse gastritis, nonobstructing duodenal mass, biopsy of stomach-no malignancy or H. pylori, duodenal mass-adenocarcinoma 16.  Hospital admission 07/12/2021 with nausea/vomiting, abdominal pain, and hypokalemia 17.  Percutaneous gastrostomy tube placement 07/01/2021  Disposition: Ms. Dassow appears unchanged.  She completed a cycle of gemcitabine/Abraxane 11/23/2021.  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  Plan to proceed with gemcitabine/Abraxane today as scheduled.  CBC from today reviewed.  Labs adequate to proceed as above.  She will return for lab, follow-up, Gemcitabine/Abraxane in 2 weeks.  She has an appointment with Dr. Altamease Oiler at [facility] later this week.   [name]   11/23/2021  11:26 AM

## 2021-11-23 NOTE — Patient Instructions (Signed)

## 2021-11-23 NOTE — Patient Instructions (Signed)
[facility]  Discharge Instructions: Thank you for choosing [facility] to provide your oncology and hematology care.   If you have a lab appointment with the [facility], please go directly to the [facility] and check in at the registration area.   Wear comfortable clothing and clothing appropriate for easy access to any Portacath or PICC line.   We strive to give you quality time with your provider. You may need to reschedule your appointment if you arrive late (15 or more minutes).  Arriving late affects you and other patients whose appointments are after yours.  Also, if you miss three or more appointments without notifying the office, you may be dismissed from the clinic at the provider's discretion.      For prescription refill requests, have your pharmacy contact our office and allow 72 hours for refills to be completed.    Today you received the following chemotherapy and/or immunotherapy agents: Abraxane, Gemzar     To help prevent nausea and vomiting after your treatment, we encourage you to take your nausea medication as directed.  BELOW ARE SYMPTOMS THAT SHOULD BE REPORTED IMMEDIATELY: *FEVER GREATER THAN 100.4 F (38 C) OR HIGHER *CHILLS OR SWEATING *NAUSEA AND VOMITING THAT IS NOT CONTROLLED WITH YOUR NAUSEA MEDICATION *UNUSUAL SHORTNESS OF BREATH *UNUSUAL BRUISING OR BLEEDING *URINARY PROBLEMS (pain or burning when urinating, or frequent urination) *BOWEL PROBLEMS (unusual diarrhea, constipation, pain near the anus) TENDERNESS IN MOUTH AND THROAT WITH OR WITHOUT PRESENCE OF ULCERS (sore throat, sores in mouth, or a toothache) UNUSUAL RASH, SWELLING OR PAIN  UNUSUAL VAGINAL DISCHARGE OR ITCHING   Items with * indicate a potential emergency and should be followed up as soon as possible or go to the Emergency Department if any problems should occur.  Please show the CHEMOTHERAPY ALERT CARD or IMMUNOTHERAPY ALERT CARD at check-in  to the Emergency Department and triage nurse.  Should you have questions after your visit or need to cancel or reschedule your appointment, please contact [facility]  Dept: [phone]  and follow the prompts.  Office hours are 8:00 a.m. to 4:30 p.m. Monday - Friday. Please note that voicemails left after 4:00 p.m. may not be returned until the following business day.  We are closed weekends and major holidays. You have access to a nurse at all times for urgent questions. Please call the main number to the clinic Dept: [phone] and follow the prompts.   For any non-urgent questions, you may also contact your provider using MyChart. We now offer e-Visits for anyone 26 and older to request care online for non-urgent symptoms. For details visit [email or web address].   Also download the MyChart app! Go to the app store, search "MyChart", open the app, select [facility], and log in with your MyChart username and password.  Due to Covid, a mask is required upon entering the hospital/clinic. If you do not have a mask, one will be given to you upon arrival. For doctor visits, patients may have 1 support person aged 75 or older with them. For treatment visits, patients cannot have anyone with them due to current Covid guidelines and our immunocompromised population.   Gemcitabine injection What is this medication? GEMCITABINE (jem SYE ta been) is a chemotherapy drug. This medicine is used to treat many types of cancer like breast cancer, lung cancer, pancreatic cancer, and ovarian cancer. This medicine may be used for other purposes; ask your health care provider or  pharmacist if you have questions. COMMON BRAND NAME(S): Gemzar, Infugem What should I tell my care team before I take this medication? They need to know if you have any of these conditions: blood disorders infection kidney disease liver disease lung or breathing disease, like asthma recent or ongoing  radiation therapy an unusual or allergic reaction to gemcitabine, other chemotherapy, other medicines, foods, dyes, or preservatives pregnant or trying to get pregnant breast-feeding How should I use this medication? This drug is given as an infusion into a vein. It is administered in a hospital or clinic by a specially trained health care professional. Talk to your pediatrician regarding the use of this medicine in children. Special care may be needed. Overdosage: If you think you have taken too much of this medicine contact a poison control center or emergency room at once. NOTE: This medicine is only for you. Do not share this medicine with others. What if I miss a dose? It is important not to miss your dose. Call your doctor or health care professional if you are unable to keep an appointment. What may interact with this medication? medicines to increase blood counts like filgrastim, pegfilgrastim, sargramostim some other chemotherapy drugs like cisplatin vaccines Talk to your doctor or health care professional before taking any of these medicines: acetaminophen aspirin ibuprofen ketoprofen naproxen This list may not describe all possible interactions. Give your health care provider a list of all the medicines, herbs, non-prescription drugs, or dietary supplements you use. Also tell them if you smoke, drink alcohol, or use illegal drugs. Some items may interact with your medicine. What should I watch for while using this medication? Visit your doctor for checks on your progress. This drug may make you feel generally unwell. This is not uncommon, as chemotherapy can affect healthy cells as well as cancer cells. Report any side effects. Continue your course of treatment even though you feel ill unless your doctor tells you to stop. In some cases, you may be given additional medicines to help with side effects. Follow all directions for their use. Call your doctor or health care  professional for advice if you get a fever, chills or sore throat, or other symptoms of a cold or flu. Do not treat yourself. This drug decreases your body's ability to fight infections. Try to avoid being around people who are sick. This medicine may increase your risk to bruise or bleed. Call your doctor or health care professional if you notice any unusual bleeding. Be careful brushing and flossing your teeth or using a toothpick because you may get an infection or bleed more easily. If you have any dental work done, tell your dentist you are receiving this medicine. Avoid taking products that contain aspirin, acetaminophen, ibuprofen, naproxen, or ketoprofen unless instructed by your doctor. These medicines may hide a fever. Do not become pregnant while taking this medicine or for 6 months after stopping it. Women should inform their doctor if they wish to become pregnant or think they might be pregnant. Men should not father a child while taking this medicine and for 3 months after stopping it. There is a potential for serious side effects to an unborn child. Talk to your health care professional or pharmacist for more information. Do not breast-feed an infant while taking this medicine or for at least 1 week after stopping it. Men should inform their doctors if they wish to father a child. This medicine may lower sperm counts. Talk with your doctor or health care  professional if you are concerned about your fertility. What side effects may I notice from receiving this medication? Side effects that you should report to your doctor or health care professional as soon as possible: allergic reactions like skin rash, itching or hives, swelling of the face, lips, or tongue breathing problems pain, redness, or irritation at site where injected signs and symptoms of a dangerous change in heartbeat or heart rhythm like chest pain; dizziness; fast or irregular heartbeat; palpitations; feeling faint or  lightheaded, falls; breathing problems signs of decreased platelets or bleeding - bruising, pinpoint red spots on the skin, black, tarry stools, blood in the urine signs of decreased red blood cells - unusually weak or tired, feeling faint or lightheaded, falls signs of infection - fever or chills, cough, sore throat, pain or difficulty passing urine signs and symptoms of kidney injury like trouble passing urine or change in the amount of urine signs and symptoms of liver injury like dark yellow or brown urine; general ill feeling or flu-like symptoms; light-colored stools; loss of appetite; nausea; right upper belly pain; unusually weak or tired; yellowing of the eyes or skin swelling of ankles, feet, hands Side effects that usually do not require medical attention (report to your doctor or health care professional if they continue or are bothersome): constipation diarrhea hair loss loss of appetite nausea rash vomiting This list may not describe all possible side effects. Call your doctor for medical advice about side effects. You may report side effects to FDA at 1-800-FDA-1088. Where should I keep my medication? This drug is given in a hospital or clinic and will not be stored at home. NOTE: This sheet is a summary. It may not cover all possible information. If you have questions about this medicine, talk to your doctor, pharmacist, or health care provider.  2022 Elsevier/Gold Standard (2018-02-22 18:06:11)  Nanoparticle Albumin-Bound Paclitaxel injection What is this medication? NANOPARTICLE ALBUMIN-BOUND PACLITAXEL (Na no PAHR ti kuhl  al BYOO muhn-bound  PAK li TAX el) is a chemotherapy drug. It targets fast dividing cells, like cancer cells, and causes these cells to die. This medicine is used to treat advanced breast cancer, lung cancer, and pancreatic cancer. This medicine may be used for other purposes; ask your health care provider or pharmacist if you have questions. COMMON  BRAND NAME(S): Abraxane What should I tell my care team before I take this medication? They need to know if you have any of these conditions: kidney disease liver disease low blood counts, like low white cell, platelet, or red cell counts lung or breathing disease, like asthma tingling of the fingers or toes, or other nerve disorder an unusual or allergic reaction to paclitaxel, albumin, other chemotherapy, other medicines, foods, dyes, or preservatives pregnant or trying to get pregnant breast-feeding How should I use this medication? This drug is given as an infusion into a vein. It is administered in a hospital or clinic by a specially trained health care professional. Talk to your pediatrician regarding the use of this medicine in children. Special care may be needed. Overdosage: If you think you have taken too much of this medicine contact a poison control center or emergency room at once. NOTE: This medicine is only for you. Do not share this medicine with others. What if I miss a dose? It is important not to miss your dose. Call your doctor or health care professional if you are unable to keep an appointment. What may interact with this medication? This medicine may  interact with the following medications: antiviral medicines for hepatitis, HIV or AIDS certain antibiotics like erythromycin and clarithromycin certain medicines for fungal infections like ketoconazole and itraconazole certain medicines for seizures like carbamazepine, phenobarbital, phenytoin gemfibrozil nefazodone rifampin St. John's wort This list may not describe all possible interactions. Give your health care provider a list of all the medicines, herbs, non-prescription drugs, or dietary supplements you use. Also tell them if you smoke, drink alcohol, or use illegal drugs. Some items may interact with your medicine. What should I watch for while using this medication? Your condition will be monitored carefully  while you are receiving this medicine. You will need important blood work done while you are taking this medicine. This medicine can cause serious allergic reactions. If you experience allergic reactions like skin rash, itching or hives, swelling of the face, lips, or tongue, tell your doctor or health care professional right away. In some cases, you may be given additional medicines to help with side effects. Follow all directions for their use. This drug may make you feel generally unwell. This is not uncommon, as chemotherapy can affect healthy cells as well as cancer cells. Report any side effects. Continue your course of treatment even though you feel ill unless your doctor tells you to stop. Call your doctor or health care professional for advice if you get a fever, chills or sore throat, or other symptoms of a cold or flu. Do not treat yourself. This drug decreases your body's ability to fight infections. Try to avoid being around people who are sick. This medicine may increase your risk to bruise or bleed. Call your doctor or health care professional if you notice any unusual bleeding. Be careful brushing and flossing your teeth or using a toothpick because you may get an infection or bleed more easily. If you have any dental work done, tell your dentist you are receiving this medicine. Avoid taking products that contain aspirin, acetaminophen, ibuprofen, naproxen, or ketoprofen unless instructed by your doctor. These medicines may hide a fever. Do not become pregnant while taking this medicine or for 6 months after stopping it. Women should inform their doctor if they wish to become pregnant or think they might be pregnant. Men should not father a child while taking this medicine or for 3 months after stopping it. There is a potential for serious side effects to an unborn child. Talk to your health care professional or pharmacist for more information. Do not breast-feed an infant while taking this  medicine or for 2 weeks after stopping it. This medicine may interfere with the ability to get pregnant or to father a child. You should talk to your doctor or health care professional if you are concerned about your fertility. What side effects may I notice from receiving this medication? Side effects that you should report to your doctor or health care professional as soon as possible: allergic reactions like skin rash, itching or hives, swelling of the face, lips, or tongue breathing problems changes in vision fast, irregular heartbeat low blood pressure mouth sores pain, tingling, numbness in the hands or feet signs of decreased platelets or bleeding - bruising, pinpoint red spots on the skin, black, tarry stools, blood in the urine signs of decreased red blood cells - unusually weak or tired, feeling faint or lightheaded, falls signs of infection - fever or chills, cough, sore throat, pain or difficulty passing urine signs and symptoms of liver injury like dark yellow or brown urine; general ill feeling  or flu-like symptoms; light-colored stools; loss of appetite; nausea; right upper belly pain; unusually weak or tired; yellowing of the eyes or skin swelling of the ankles, feet, hands unusually slow heartbeat Side effects that usually do not require medical attention (report to your doctor or health care professional if they continue or are bothersome): diarrhea hair loss loss of appetite nausea, vomiting tiredness This list may not describe all possible side effects. Call your doctor for medical advice about side effects. You may report side effects to FDA at 1-800-FDA-1088. Where should I keep my medication? This drug is given in a hospital or clinic and will not be stored at home. NOTE: This sheet is a summary. It may not cover all possible information. If you have questions about this medicine, talk to your doctor, pharmacist, or health care provider.  2022 Elsevier/Gold Standard  (2017-08-02 00:00:00)

## 2021-11-23 NOTE — Progress Notes (Signed)
Patient presents for treatment. RN assessment completed along with the following:  Labs/vitals reviewed - Yes, and within treatment parameters.   Weight within 10% of previous measurement - Yes Oncology Treatment Attestation completed for current therapy- Yes, on date 07/07/21 Informed consent completed and reflects current therapy/intent - Yes, on date 07/30/21             Provider progress note reviewed - Yes, today's provider note was reviewed. Treatment/Antibody/Supportive plan reviewed - Yes, and there are no adjustments needed for today's treatment. S&H and other orders reviewed - Yes, and there are no additional orders identified. Previous treatment date reviewed - Yes, and the appropriate amount of time has elapsed between treatments. Clinic Hand Off Received from - Ned Card, NP  Patient to proceed with treatment.

## 2021-11-24 ENCOUNTER — Other Ambulatory Visit: Payer: Self-pay | Admitting: *Deleted

## 2021-11-24 LAB — CANCER ANTIGEN 19-9: CA 19-9: 13535 U/mL — ABNORMAL HIGH (ref 0–35)

## 2021-11-24 NOTE — Progress Notes (Signed)
Received request for PA for Symbicort from [facility]. Called [facility] and was informed if pharmacy sends it through as brand name it will be approved. Faxed this information to [facility] [phone] after 20 minute hold on phone and then was disconnected.

## 2021-11-26 ENCOUNTER — Telehealth: Payer: Self-pay | Admitting: Oncology

## 2021-11-26 NOTE — Telephone Encounter (Signed)
Scheduled appt per 12/12 los - unable to reach patient . Left message for patient with appt date and time.

## 2021-12-01 ENCOUNTER — Inpatient Hospital Stay: Payer: Medicaid Other

## 2021-12-01 ENCOUNTER — Inpatient Hospital Stay: Payer: Medicaid Other | Admitting: Oncology

## 2021-12-07 ENCOUNTER — Other Ambulatory Visit: Payer: Self-pay | Admitting: Oncology

## 2021-12-09 ENCOUNTER — Inpatient Hospital Stay: Payer: Medicaid Other

## 2021-12-09 ENCOUNTER — Other Ambulatory Visit: Payer: Self-pay

## 2021-12-09 ENCOUNTER — Inpatient Hospital Stay (HOSPITAL_BASED_OUTPATIENT_CLINIC_OR_DEPARTMENT_OTHER): Payer: Medicaid Other | Admitting: Oncology

## 2021-12-09 VITALS — BP 148/92 | HR 86 | Temp 97.8°F | Resp 20 | Ht 64.0 in | Wt 178.2 lb

## 2021-12-09 DIAGNOSIS — C250 Malignant neoplasm of head of pancreas: Secondary | ICD-10-CM

## 2021-12-09 DIAGNOSIS — C259 Malignant neoplasm of pancreas, unspecified: Secondary | ICD-10-CM

## 2021-12-09 DIAGNOSIS — Z5111 Encounter for antineoplastic chemotherapy: Secondary | ICD-10-CM | POA: Diagnosis not present

## 2021-12-09 LAB — CMP (CANCER CENTER ONLY)
ALT: 10 U/L (ref 0–44)
AST: 21 U/L (ref 15–41)
Albumin: 3.7 g/dL (ref 3.5–5.0)
Alkaline Phosphatase: 84 U/L (ref 38–126)
Anion gap: 6 (ref 5–15)
BUN: 12 mg/dL (ref 6–20)
CO2: 32 mmol/L (ref 22–32)
Calcium: 9.7 mg/dL (ref 8.9–10.3)
Chloride: 101 mmol/L (ref 98–111)
Creatinine: 0.94 mg/dL (ref 0.44–1.00)
GFR, Estimated: >60 mL/min (ref >60)
Glucose, Bld: 97 mg/dL (ref 70–99)
Potassium: 3.7 mmol/L (ref 3.5–5.1)
Sodium: 139 mmol/L (ref 135–145)
Total Bilirubin: 0.5 mg/dL (ref 0.3–1.2)
Total Protein: 6.3 g/dL — ABNORMAL LOW (ref 6.5–8.1)

## 2021-12-09 LAB — CBC WITH DIFFERENTIAL (CANCER CENTER ONLY)
Abs Immature Granulocytes: 0.01 10*3/uL (ref 0.00–0.07)
Basophils Absolute: 0 10*3/uL (ref 0.0–0.1)
Basophils Relative: 0 %
Eosinophils Absolute: 0.2 10*3/uL (ref 0.0–0.5)
Eosinophils Relative: 5 %
HCT: 31.4 % — ABNORMAL LOW (ref 36.0–46.0)
Hemoglobin: 10.2 g/dL — ABNORMAL LOW (ref 12.0–15.0)
Immature Granulocytes: 0 %
Lymphocytes Relative: 16 %
Lymphs Abs: 0.7 10*3/uL (ref 0.7–4.0)
MCH: 31.3 pg (ref 26.0–34.0)
MCHC: 32.5 g/dL (ref 30.0–36.0)
MCV: 96.3 fL (ref 80.0–100.0)
Monocytes Absolute: 0.4 10*3/uL (ref 0.1–1.0)
Monocytes Relative: 10 %
Neutro Abs: 3.1 10*3/uL (ref 1.7–7.7)
Neutrophils Relative %: 69 %
Platelet Count: 260 10*3/uL (ref 150–400)
RBC: 3.26 MIL/uL — ABNORMAL LOW (ref 3.87–5.11)
RDW: 15.3 % (ref 11.5–15.5)
WBC Count: 4.5 10*3/uL (ref 4.0–10.5)
nRBC: 0 % (ref 0.0–0.2)

## 2021-12-09 MED ORDER — FLUCONAZOLE 50 MG PO TABS: 100.0000 mg | ORAL_TABLET | Freq: Every day | ORAL | 0 refills | Status: AC

## 2021-12-09 NOTE — Progress Notes (Signed)
[facility] OFFICE PROGRESS NOTE   Diagnosis: Pancreas cancer  INTERVAL HISTORY:   Wendy Hoffman was last treated with gemcitabine/Abraxane on 11/23/2021.  She reports minimal numbness in the hands and stable numbness in the feet.  The foot numbness has not progressed since resuming Abraxane.  She continues to have upper abdominal pain.  She had 3 days of more intense upper abdominal pain recently.  She continues MS Contin and Roxanol.  She vomits intermittently.  She does not vomit daily.  She is using Glucerna as a nutrition supplement. Wendy Hoffman saw Dr. Altamease Oiler at [facility] on 11/30/2021.  She was offered enrollment on clinical trial with a The RK inhibitor plus hydroxychloroquine.  Wendy Hoffman has decided against the clinical trial. Objective:  Vital signs in last 24 hours:  Blood pressure (!) 148/92, pulse 86, temperature 97.8 F (36.6 C), temperature source Oral, resp. rate 20, height '5\' 4"'  (1.626 m), weight 178 lb 3.2 oz (80.8 kg), SpO2 100 %.    HEENT: Thrush at the buccal mucosa, pharynx, and tongue Resp: End inspiratory rales at the lower posterior chest bilaterally, no respiratory distress Cardio: Regular rate and rhythm GI: Tender in the subxiphoid region and left upper abdomen, no mass, no hepatomegaly Vascular: No leg edema   Portacath/PICC-without erythema  Lab Results:  Lab Results  Component Value Date   WBC 4.5 12/09/2021   HGB 10.2 (L) 12/09/2021   HCT 31.4 (L) 12/09/2021   MCV 96.3 12/09/2021   PLT 260 12/09/2021   NEUTROABS 3.1 12/09/2021    CMP  Lab Results  Component Value Date   NA 142 11/23/2021   K 3.6 11/23/2021   CL 105 11/23/2021   CO2 31 11/23/2021   GLUCOSE 95 11/23/2021   BUN 14 11/23/2021   CREATININE 0.77 11/23/2021   CALCIUM 9.5 11/23/2021   PROT 6.2 (L) 11/23/2021   ALBUMIN 3.5 11/23/2021   AST 21 11/23/2021   ALT 10 11/23/2021   ALKPHOS 92 11/23/2021   BILITOT 0.4 11/23/2021   GFRNONAA >60 11/23/2021   GFRAA >60  07/21/2020    Lab Results  Component Value Date   CEA1 68.8 (H) 02/23/2021   CEA <0.5 10/15/2008   DUK025 13,535 (H) 11/23/2021      Medications: I have reviewed the patient's current medications.   Assessment/Plan: Pancreas cancer -CT abdomen/pelvis without contrast 02/16/2021-multiple large hypoechoic masses in the patient's liver consistent metastatic disease, ill-defined masslike area in the pancreatic head measuring approximate 4.7 cm, possible filling defect in the right ventricle. -EGD 02/19/2021-large infiltrative mass with bleeding in the second portion of the duodenum, appears to be arising near the ampulla, partially obstructive with friable mucosa.  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 -Flexible sigmoidoscopy 02/19/2021-diverticulosis in the sigmoid colon, nonbleeding external and internal hemorrhoids -Cardiac CT 02/20/2021-mass in the mid RV attached to the interventricular septum measuring 16 mm x 6 mm and concerning for metastatic tumor -MRI abdomen with/without contrast 02/21/2021-mass in the posterior pancreatic head measuring 4.3 x 3.8 cm with obstruction of the central pancreatic duct and diffuse dilatation up to 6 mm consistent with pancreatic adenocarcinoma, liver lesions the largest mass of the central left lobe measuring 9.9 x 9.2 cm consistent with hepatic metastatic disease,  hepatomegaly. -Ultrasound-guided biopsy of a left liver lesion 02/24/2021-adenocarcinoma, morphologically similar to the duodenal biopsy; microsatellite stable, tumor mutation burden 1 -Negative genetic testing -Palliative radiation to the duodenal mass 02/25/2021-03/06/2021 -Cycle 1 FOLFOX 03/17/2021 -Cycle 2 FOLFOX 04/01/2021 -Cycle 3 FOLFIRINOX 04/14/2021 -Cycle  4 FOLFIRINOX 04/27/2021, Udenyca added -Cycle 5 FOLFIRINOX 05/12/2021, Udenyca -MRI abdomen 05/23/2021-decrease in size of pancreas mass and hepatic lesions, no progressive disease -Cycle 6 FOLFIRINOX  05/27/2021 -CT 06/17/2021-multiple liver metastases similar to her prior exam.  Pancreas mass not significantly changed. -CT 06/26/2021-stable pancreas mass, no significant change in size and number of known liver metastases. -CT abdomen/pelvis 07/12/2021-new gastrostomy tube.  Multiple liver masses slightly decreased in size from prior exam.  Pancreas head mass unchanged. -Cycle 1 gemcitabine/Abraxane 07/30/2021 -Cycle 2 gemcitabine/Abraxane 08/13/2021 -Cycle 3 gemcitabine 08/27/2021, Abraxane held due to significant bilateral toe pain, question neuropathy -Cycle 4 gemcitabine, Abraxane held due to neuropathy, 09/10/2021 -Cycle 5 gemcitabine, Abraxane held due to neuropathy, 09/24/2021 -Cycle 6 gemcitabine, Abraxane held due to neuropathy 10/16/2021 -CTs 10/29/2021-mild progression of multifocal liver metastases; mild increase in size of hypoenhancing mass within the head of the pancreas; new mild pleural nodularity along the posterior lower lobes -Cycle 7 gemcitabine/Abraxane 11/03/2021 -Cycle 8 gemcitabine/Abraxane 11/23/2021 -Cycle 9 gemcitabine/Abraxane 12/10/2021 2.  Iron deficiency anemia-improved -Feraheme 510 mg 02/20/2021 3.  Protein calorie malnutrition 4.  Possible right ventricular filling defect on noncontrast CT scan MRI cardiac morphology 02/21/2021-mass in the mid RV attached to the interventricular septum measures 16 mm x 6 mm.  Mass appears heterogeneous with area of enhancement on LGE imaging.  Mass is concerning for metastatic tumor.  Normal LV size and systolic function.  Normal RV size and systolic function. 5.  Hypertension 6.  History of CVA 7.  Hyperlipidemia 8.  Hypothyroidism 9.  Morbid obesity 10.  Asthma 11.  Migraines 12.  Pain secondary to #1 13.  Port-A-Cath placement 02/25/2021 14.  Hypokalemia 04/01/2021-started a potassium supplement 15.  Hospital admission 06/17/2021-intractable nausea and vomiting EGD 06/19/2021-diffuse gastritis, nonobstructing duodenal mass, biopsy  of stomach-no malignancy or H. pylori, duodenal mass-adenocarcinoma 16.  Hospital admission 07/12/2021 with nausea/vomiting, abdominal pain, and hypokalemia 17.  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  Her overall status appears unchanged.  We will follow-up on the CA 19-9 from today.  She has decided against a clinical trial at [facility].  The plan is to continue gemcitabine/Abraxane.  She will be scheduled for restaging CT evaluation after 2 or 3 more treatments.  She will complete another treatment with gemcitabine/Abraxane on 12/10/2021.  She will return for an office visit in the next cycle of chemotherapy on 12/24/2021.   She will complete a course of Diflucan for oral candidiasis. Betsy Coder, MD  12/09/2021  3:14 PM

## 2021-12-10 ENCOUNTER — Inpatient Hospital Stay: Payer: Medicaid Other

## 2021-12-10 VITALS — BP 157/105 | HR 79 | Temp 98.0°F | Resp 18

## 2021-12-10 DIAGNOSIS — C250 Malignant neoplasm of head of pancreas: Secondary | ICD-10-CM

## 2021-12-10 DIAGNOSIS — Z5111 Encounter for antineoplastic chemotherapy: Secondary | ICD-10-CM | POA: Diagnosis not present

## 2021-12-10 LAB — CANCER ANTIGEN 19-9: CA 19-9: 13224 U/mL — ABNORMAL HIGH (ref 0–35)

## 2021-12-10 MED ORDER — PROCHLORPERAZINE MALEATE 10 MG PO TABS: 10.0000 mg | ORAL_TABLET | Freq: Once | ORAL | Status: DC

## 2021-12-10 MED ORDER — SODIUM CHLORIDE 0.9% FLUSH
10.0000 mL | INTRAVENOUS | Status: DC | PRN
  Administered 2021-12-10: 15:00:00 10 mL

## 2021-12-10 MED ORDER — SODIUM CHLORIDE 0.9 % IV SOLN
1000.0000 mg/m2 | Freq: Once | INTRAVENOUS | Status: AC
  Administered 2021-12-10: 15:00:00 1900 mg via INTRAVENOUS
  Filled 2021-12-10: qty 49.97

## 2021-12-10 MED ORDER — HEPARIN SOD (PORK) LOCK FLUSH 100 UNIT/ML IV SOLN
500.0000 [IU] | Freq: Once | INTRAVENOUS | Status: AC | PRN
  Administered 2021-12-10: 15:00:00 500 [IU]

## 2021-12-10 MED ORDER — PACLITAXEL PROTEIN-BOUND CHEMO INJECTION 100 MG
100.0000 mg/m2 | Freq: Once | INTRAVENOUS | Status: AC
  Administered 2021-12-10: 14:00:00 200 mg via INTRAVENOUS
  Filled 2021-12-10: qty 40

## 2021-12-10 MED ORDER — SODIUM CHLORIDE 0.9 % IV SOLN
Freq: Once | INTRAVENOUS | Status: AC
Start: 2021-12-10 — End: 2021-12-10

## 2021-12-10 NOTE — Progress Notes (Signed)
Patient presents for treatment. RN assessment completed along with the following:  Labs/vitals reviewed - Yes, and within treatment parameters.   Weight within 10% of previous measurement - Yes Oncology Treatment Attestation completed for current therapy- Yes, on date 03/06/21 Informed consent completed and reflects current therapy/intent - Yes, on date 07/30/21             Provider progress note reviewed - Patient not seen by provider today. Most recent note dated 12/10/21 reviewed. Treatment/Antibody/Supportive plan reviewed - Yes, and there are no adjustments needed for today's treatment. S&H and other orders reviewed - Yes, and there are no additional orders identified. Previous treatment date reviewed - Yes, and the appropriate amount of time has elapsed between treatments. Clinic Hand Off Received from - none  Patient to proceed with treatment.

## 2021-12-10 NOTE — Patient Instructions (Signed)
Wendy Hoffman   Discharge Instructions: Thank you for choosing [facility] to provide your oncology and hematology care.   If you have a lab appointment with the [facility], please go directly to the [facility] and check in at the registration area.   Wear comfortable clothing and clothing appropriate for easy access to any Portacath or PICC line.   We strive to give you quality time with your provider. You may need to reschedule your appointment if you arrive late (15 or more minutes).  Arriving late affects you and other patients whose appointments are after yours.  Also, if you miss three or more appointments without notifying the office, you may be dismissed from the clinic at the providers discretion.      For prescription refill requests, have your pharmacy contact our office and allow 72 hours for refills to be completed.    Today you received the following chemotherapy and/or immunotherapy agents Abraxane, gemzar      To help prevent nausea and vomiting after your treatment, we encourage you to take your nausea medication as directed.  BELOW ARE SYMPTOMS THAT SHOULD BE REPORTED IMMEDIATELY: *FEVER GREATER THAN 100.4 F (38 C) OR HIGHER *CHILLS OR SWEATING *NAUSEA AND VOMITING THAT IS NOT CONTROLLED WITH YOUR NAUSEA MEDICATION *UNUSUAL SHORTNESS OF BREATH *UNUSUAL BRUISING OR BLEEDING *URINARY PROBLEMS (pain or burning when urinating, or frequent urination) *BOWEL PROBLEMS (unusual diarrhea, constipation, pain near the anus) TENDERNESS IN MOUTH AND THROAT WITH OR WITHOUT PRESENCE OF ULCERS (sore throat, sores in mouth, or a toothache) UNUSUAL RASH, SWELLING OR PAIN  UNUSUAL VAGINAL DISCHARGE OR ITCHING   Items with * indicate a potential emergency and should be followed up as soon as possible or go to the Emergency Department if any problems should occur.  Please show the CHEMOTHERAPY ALERT CARD or IMMUNOTHERAPY ALERT CARD at  check-in to the Emergency Department and triage nurse.  Should you have questions after your visit or need to cancel or reschedule your appointment, please contact [facility]  Dept: [phone]  and follow the prompts.  Office hours are 8:00 a.m. to 4:30 p.m. Monday - Friday. Please note that voicemails left after 4:00 p.m. may not be returned until the following business day.  We are closed weekends and major holidays. You have access to a nurse at all times for urgent questions. Please call the main number to the clinic Dept: [phone] and follow the prompts.   For any non-urgent questions, you may also contact your provider using MyChart. We now offer e-Visits for anyone 105 and older to request care online for non-urgent symptoms. For details visit [email or web address].   Also download the MyChart app! Go to the app store, search "MyChart", open the app, select [facility], and log in with your MyChart username and password.  Due to Covid, a mask is required upon entering the hospital/clinic. If you do not have a mask, one will be given to you upon arrival. For doctor visits, patients may have 1 support person aged 29 or older with them. For treatment visits, patients cannot have anyone with them due to current Covid guidelines and our immunocompromised population.   Nanoparticle Albumin-Bound Paclitaxel injection What is this medication? NANOPARTICLE ALBUMIN-BOUND PACLITAXEL (Na no PAHR ti kuhl  al BYOO muhn-bound  PAK li TAX el) is a chemotherapy drug. It targets fast dividing cells, like cancer cells, and causes these cells to die. This medicine is used  to treat advanced breast cancer, lung cancer, and pancreatic cancer. This medicine may be used for other purposes; ask your health care provider or pharmacist if you have questions. COMMON BRAND NAME(S): Abraxane What should I tell my care team before I take this medication? They need to know if you have any  of these conditions: kidney disease liver disease low blood counts, like low white cell, platelet, or red cell counts lung or breathing disease, like asthma tingling of the fingers or toes, or other nerve disorder an unusual or allergic reaction to paclitaxel, albumin, other chemotherapy, other medicines, foods, dyes, or preservatives pregnant or trying to get pregnant breast-feeding How should I use this medication? This drug is given as an infusion into a vein. It is administered in a hospital or clinic by a specially trained health care professional. Talk to your pediatrician regarding the use of this medicine in children. Special care may be needed. Overdosage: If you think you have taken too much of this medicine contact a poison control center or emergency room at once. NOTE: This medicine is only for you. Do not share this medicine with others. What if I miss a dose? It is important not to miss your dose. Call your doctor or health care professional if you are unable to keep an appointment. What may interact with this medication? This medicine may interact with the following medications: antiviral medicines for hepatitis, HIV or AIDS certain antibiotics like erythromycin and clarithromycin certain medicines for fungal infections like ketoconazole and itraconazole certain medicines for seizures like carbamazepine, phenobarbital, phenytoin gemfibrozil nefazodone rifampin St. John's wort This list may not describe all possible interactions. Give your health care provider a list of all the medicines, herbs, non-prescription drugs, or dietary supplements you use. Also tell them if you smoke, drink alcohol, or use illegal drugs. Some items may interact with your medicine. What should I watch for while using this medication? Your condition will be monitored carefully while you are receiving this medicine. You will need important blood work done while you are taking this medicine. This  medicine can cause serious allergic reactions. If you experience allergic reactions like skin rash, itching or hives, swelling of the face, lips, or tongue, tell your doctor or health care professional right away. In some cases, you may be given additional medicines to help with side effects. Follow all directions for their use. This drug may make you feel generally unwell. This is not uncommon, as chemotherapy can affect healthy cells as well as cancer cells. Report any side effects. Continue your course of treatment even though you feel ill unless your doctor tells you to stop. Call your doctor or health care professional for advice if you get a fever, chills or sore throat, or other symptoms of a cold or flu. Do not treat yourself. This drug decreases your body's ability to fight infections. Try to avoid being around people who are sick. This medicine may increase your risk to bruise or bleed. Call your doctor or health care professional if you notice any unusual bleeding. Be careful brushing and flossing your teeth or using a toothpick because you may get an infection or bleed more easily. If you have any dental work done, tell your dentist you are receiving this medicine. Avoid taking products that contain aspirin, acetaminophen, ibuprofen, naproxen, or ketoprofen unless instructed by your doctor. These medicines may hide a fever. Do not become pregnant while taking this medicine or for 6 months after stopping it. Women should  inform their doctor if they wish to become pregnant or think they might be pregnant. Men should not father a child while taking this medicine or for 3 months after stopping it. There is a potential for serious side effects to an unborn child. Talk to your health care professional or pharmacist for more information. Do not breast-feed an infant while taking this medicine or for 2 weeks after stopping it. This medicine may interfere with the ability to get pregnant or to father a  child. You should talk to your doctor or health care professional if you are concerned about your fertility. What side effects may I notice from receiving this medication? Side effects that you should report to your doctor or health care professional as soon as possible: allergic reactions like skin rash, itching or hives, swelling of the face, lips, or tongue breathing problems changes in vision fast, irregular heartbeat low blood pressure mouth sores pain, tingling, numbness in the hands or feet signs of decreased platelets or bleeding - bruising, pinpoint red spots on the skin, black, tarry stools, blood in the urine signs of decreased red blood cells - unusually weak or tired, feeling faint or lightheaded, falls signs of infection - fever or chills, cough, sore throat, pain or difficulty passing urine signs and symptoms of liver injury like dark yellow or brown urine; general ill feeling or flu-like symptoms; light-colored stools; loss of appetite; nausea; right upper belly pain; unusually weak or tired; yellowing of the eyes or skin swelling of the ankles, feet, hands unusually slow heartbeat Side effects that usually do not require medical attention (report to your doctor or health care professional if they continue or are bothersome): diarrhea hair loss loss of appetite nausea, vomiting tiredness This list may not describe all possible side effects. Call your doctor for medical advice about side effects. You may report side effects to FDA at 1-800-FDA-1088. Where should I keep my medication? This drug is given in a hospital or clinic and will not be stored at home. NOTE: This sheet is a summary. It may not cover all possible information. If you have questions about this medicine, talk to your doctor, pharmacist, or health care provider.  2022 Elsevier/Gold Standard (2017-08-02 00:00:00)  Gemcitabine injection What is this medication? GEMCITABINE (jem SYE ta been) is a  chemotherapy drug. This medicine is used to treat many types of cancer like breast cancer, lung cancer, pancreatic cancer, and ovarian cancer. This medicine may be used for other purposes; ask your health care provider or pharmacist if you have questions. COMMON BRAND NAME(S): Gemzar, Infugem What should I tell my care team before I take this medication? They need to know if you have any of these conditions: blood disorders infection kidney disease liver disease lung or breathing disease, like asthma recent or ongoing radiation therapy an unusual or allergic reaction to gemcitabine, other chemotherapy, other medicines, foods, dyes, or preservatives pregnant or trying to get pregnant breast-feeding How should I use this medication? This drug is given as an infusion into a vein. It is administered in a hospital or clinic by a specially trained health care professional. Talk to your pediatrician regarding the use of this medicine in children. Special care may be needed. Overdosage: If you think you have taken too much of this medicine contact a poison control center or emergency room at once. NOTE: This medicine is only for you. Do not share this medicine with others. What if I miss a dose? It is important not  to miss your dose. Call your doctor or health care professional if you are unable to keep an appointment. What may interact with this medication? medicines to increase blood counts like filgrastim, pegfilgrastim, sargramostim some other chemotherapy drugs like cisplatin vaccines Talk to your doctor or health care professional before taking any of these medicines: acetaminophen aspirin ibuprofen ketoprofen naproxen This list may not describe all possible interactions. Give your health care provider a list of all the medicines, herbs, non-prescription drugs, or dietary supplements you use. Also tell them if you smoke, drink alcohol, or use illegal drugs. Some items may interact with  your medicine. What should I watch for while using this medication? Visit your doctor for checks on your progress. This drug may make you feel generally unwell. This is not uncommon, as chemotherapy can affect healthy cells as well as cancer cells. Report any side effects. Continue your course of treatment even though you feel ill unless your doctor tells you to stop. In some cases, you may be given additional medicines to help with side effects. Follow all directions for their use. Call your doctor or health care professional for advice if you get a fever, chills or sore throat, or other symptoms of a cold or flu. Do not treat yourself. This drug decreases your body's ability to fight infections. Try to avoid being around people who are sick. This medicine may increase your risk to bruise or bleed. Call your doctor or health care professional if you notice any unusual bleeding. Be careful brushing and flossing your teeth or using a toothpick because you may get an infection or bleed more easily. If you have any dental work done, tell your dentist you are receiving this medicine. Avoid taking products that contain aspirin, acetaminophen, ibuprofen, naproxen, or ketoprofen unless instructed by your doctor. These medicines may hide a fever. Do not become pregnant while taking this medicine or for 6 months after stopping it. Women should inform their doctor if they wish to become pregnant or think they might be pregnant. Men should not father a child while taking this medicine and for 3 months after stopping it. There is a potential for serious side effects to an unborn child. Talk to your health care professional or pharmacist for more information. Do not breast-feed an infant while taking this medicine or for at least 1 week after stopping it. Men should inform their doctors if they wish to father a child. This medicine may lower sperm counts. Talk with your doctor or health care professional if you are  concerned about your fertility. What side effects may I notice from receiving this medication? Side effects that you should report to your doctor or health care professional as soon as possible: allergic reactions like skin rash, itching or hives, swelling of the face, lips, or tongue breathing problems pain, redness, or irritation at site where injected signs and symptoms of a dangerous change in heartbeat or heart rhythm like chest pain; dizziness; fast or irregular heartbeat; palpitations; feeling faint or lightheaded, falls; breathing problems signs of decreased platelets or bleeding - bruising, pinpoint red spots on the skin, black, tarry stools, blood in the urine signs of decreased red blood cells - unusually weak or tired, feeling faint or lightheaded, falls signs of infection - fever or chills, cough, sore throat, pain or difficulty passing urine signs and symptoms of kidney injury like trouble passing urine or change in the amount of urine signs and symptoms of liver injury like dark yellow or  brown urine; general ill feeling or flu-like symptoms; light-colored stools; loss of appetite; nausea; right upper belly pain; unusually weak or tired; yellowing of the eyes or skin swelling of ankles, feet, hands Side effects that usually do not require medical attention (report to your doctor or health care professional if they continue or are bothersome): constipation diarrhea hair loss loss of appetite nausea rash vomiting This list may not describe all possible side effects. Call your doctor for medical advice about side effects. You may report side effects to FDA at 1-800-FDA-1088. Where should I keep my medication? This drug is given in a hospital or clinic and will not be stored at home. NOTE: This sheet is a summary. It may not cover all possible information. If you have questions about this medicine, talk to your doctor, pharmacist, or health care provider.  2022 Elsevier/Gold  Standard (2018-02-22 00:00:00)

## 2021-12-15 ENCOUNTER — Other Ambulatory Visit: Payer: Self-pay | Admitting: Nurse Practitioner

## 2021-12-15 ENCOUNTER — Other Ambulatory Visit: Payer: Self-pay | Admitting: Oncology

## 2021-12-15 DIAGNOSIS — C250 Malignant neoplasm of head of pancreas: Secondary | ICD-10-CM

## 2021-12-20 ENCOUNTER — Other Ambulatory Visit: Payer: Self-pay | Admitting: Oncology

## 2021-12-21 ENCOUNTER — Other Ambulatory Visit: Payer: Self-pay | Admitting: Nurse Practitioner

## 2021-12-21 DIAGNOSIS — C250 Malignant neoplasm of head of pancreas: Secondary | ICD-10-CM

## 2021-12-24 ENCOUNTER — Inpatient Hospital Stay: Payer: Medicaid Other | Attending: Nurse Practitioner

## 2021-12-24 ENCOUNTER — Other Ambulatory Visit: Payer: Self-pay

## 2021-12-24 ENCOUNTER — Other Ambulatory Visit: Payer: Self-pay | Admitting: Nurse Practitioner

## 2021-12-24 ENCOUNTER — Inpatient Hospital Stay: Payer: Medicaid Other

## 2021-12-24 ENCOUNTER — Encounter: Payer: Self-pay | Admitting: *Deleted

## 2021-12-24 ENCOUNTER — Inpatient Hospital Stay (HOSPITAL_BASED_OUTPATIENT_CLINIC_OR_DEPARTMENT_OTHER): Payer: Medicaid Other | Admitting: Nurse Practitioner

## 2021-12-24 ENCOUNTER — Encounter: Payer: Self-pay | Admitting: Nurse Practitioner

## 2021-12-24 VITALS — BP 147/97 | HR 80 | Temp 97.9°F | Resp 18

## 2021-12-24 VITALS — BP 140/90 | HR 91 | Temp 97.8°F | Resp 18 | Ht 64.0 in | Wt 180.2 lb

## 2021-12-24 DIAGNOSIS — C253 Malignant neoplasm of pancreatic duct: Secondary | ICD-10-CM | POA: Diagnosis not present

## 2021-12-24 DIAGNOSIS — Z5111 Encounter for antineoplastic chemotherapy: Secondary | ICD-10-CM | POA: Insufficient documentation

## 2021-12-24 DIAGNOSIS — C250 Malignant neoplasm of head of pancreas: Secondary | ICD-10-CM

## 2021-12-24 DIAGNOSIS — C787 Secondary malignant neoplasm of liver and intrahepatic bile duct: Secondary | ICD-10-CM | POA: Diagnosis not present

## 2021-12-24 DIAGNOSIS — C259 Malignant neoplasm of pancreas, unspecified: Secondary | ICD-10-CM

## 2021-12-24 LAB — CBC WITH DIFFERENTIAL (CANCER CENTER ONLY)
Abs Immature Granulocytes: 0.01 10*3/uL (ref 0.00–0.07)
Basophils Absolute: 0 10*3/uL (ref 0.0–0.1)
Basophils Relative: 0 %
Eosinophils Absolute: 0.5 10*3/uL (ref 0.0–0.5)
Eosinophils Relative: 12 %
HCT: 29.2 % — ABNORMAL LOW (ref 36.0–46.0)
Hemoglobin: 9.7 g/dL — ABNORMAL LOW (ref 12.0–15.0)
Immature Granulocytes: 0 %
Lymphocytes Relative: 22 %
Lymphs Abs: 0.9 10*3/uL (ref 0.7–4.0)
MCH: 31.4 pg (ref 26.0–34.0)
MCHC: 33.2 g/dL (ref 30.0–36.0)
MCV: 94.5 fL (ref 80.0–100.0)
Monocytes Absolute: 0.4 10*3/uL (ref 0.1–1.0)
Monocytes Relative: 10 %
Neutro Abs: 2.3 10*3/uL (ref 1.7–7.7)
Neutrophils Relative %: 56 %
Platelet Count: 181 10*3/uL (ref 150–400)
RBC: 3.09 MIL/uL — ABNORMAL LOW (ref 3.87–5.11)
RDW: 14.7 % (ref 11.5–15.5)
WBC Count: 4.2 10*3/uL (ref 4.0–10.5)
nRBC: 0 % (ref 0.0–0.2)

## 2021-12-24 LAB — CMP (CANCER CENTER ONLY)
ALT: 15 U/L (ref 0–44)
AST: 34 U/L (ref 15–41)
Albumin: 3.5 g/dL (ref 3.5–5.0)
Alkaline Phosphatase: 98 U/L (ref 38–126)
Anion gap: 8 (ref 5–15)
BUN: 16 mg/dL (ref 6–20)
CO2: 28 mmol/L (ref 22–32)
Calcium: 9.1 mg/dL (ref 8.9–10.3)
Chloride: 105 mmol/L (ref 98–111)
Creatinine: 0.9 mg/dL (ref 0.44–1.00)
GFR, Estimated: >60 mL/min (ref >60)
Glucose, Bld: 101 mg/dL — ABNORMAL HIGH (ref 70–99)
Potassium: 3.5 mmol/L (ref 3.5–5.1)
Sodium: 141 mmol/L (ref 135–145)
Total Bilirubin: 0.3 mg/dL (ref 0.3–1.2)
Total Protein: 6.1 g/dL — ABNORMAL LOW (ref 6.5–8.1)

## 2021-12-24 MED ORDER — SODIUM CHLORIDE 0.9 % IV SOLN: Freq: Once | INTRAVENOUS | Status: AC

## 2021-12-24 MED ORDER — SODIUM CHLORIDE 0.9 % IV SOLN
1000.0000 mg/m2 | Freq: Once | INTRAVENOUS | Status: AC
  Administered 2021-12-24: 1900 mg via INTRAVENOUS
  Filled 2021-12-24: qty 49.97

## 2021-12-24 MED ORDER — PROCHLORPERAZINE MALEATE 10 MG PO TABS
10.0000 mg | ORAL_TABLET | Freq: Once | ORAL | Status: AC
  Administered 2021-12-24: 10 mg via ORAL
  Filled 2021-12-24: qty 1

## 2021-12-24 MED ORDER — MORPHINE SULFATE (CONCENTRATE) 10 MG/0.5ML PO SOLN: 10.0000 mg | ORAL | 0 refills | Status: DC | PRN

## 2021-12-24 MED ORDER — SODIUM CHLORIDE 0.9% FLUSH
10.0000 mL | INTRAVENOUS | Status: DC | PRN
  Administered 2021-12-24: 10 mL

## 2021-12-24 MED ORDER — HEPARIN SOD (PORK) LOCK FLUSH 100 UNIT/ML IV SOLN
500.0000 [IU] | Freq: Once | INTRAVENOUS | Status: AC | PRN
  Administered 2021-12-24: 500 [IU]

## 2021-12-24 MED ORDER — PACLITAXEL PROTEIN-BOUND CHEMO INJECTION 100 MG
100.0000 mg/m2 | Freq: Once | INTRAVENOUS | Status: AC
  Administered 2021-12-24: 200 mg via INTRAVENOUS
  Filled 2021-12-24: qty 40

## 2021-12-24 NOTE — Patient Instructions (Signed)
[facility]   Discharge Instructions: Thank you for choosing [facility] to provide your oncology and hematology care.   If you have a lab appointment with the [facility], please go directly to the [facility] and check in at the registration area.   Wear comfortable clothing and clothing appropriate for easy access to any Portacath or PICC line.   We strive to give you quality time with your provider. You may need to reschedule your appointment if you arrive late (15 or more minutes).  Arriving late affects you and other patients whose appointments are after yours.  Also, if you miss three or more appointments without notifying the office, you may be dismissed from the clinic at the providers discretion.      For prescription refill requests, have your pharmacy contact our office and allow 72 hours for refills to be completed.    Today you received the following chemotherapy and/or immunotherapy agents Abraxane, gemzar      To help prevent nausea and vomiting after your treatment, we encourage you to take your nausea medication as directed.  BELOW ARE SYMPTOMS THAT SHOULD BE REPORTED IMMEDIATELY: *FEVER GREATER THAN 100.4 F (38 C) OR HIGHER *CHILLS OR SWEATING *NAUSEA AND VOMITING THAT IS NOT CONTROLLED WITH YOUR NAUSEA MEDICATION *UNUSUAL SHORTNESS OF BREATH *UNUSUAL BRUISING OR BLEEDING *URINARY PROBLEMS (pain or burning when urinating, or frequent urination) *BOWEL PROBLEMS (unusual diarrhea, constipation, pain near the anus) TENDERNESS IN MOUTH AND THROAT WITH OR WITHOUT PRESENCE OF ULCERS (sore throat, sores in mouth, or a toothache) UNUSUAL RASH, SWELLING OR PAIN  UNUSUAL VAGINAL DISCHARGE OR ITCHING   Items with * indicate a potential emergency and should be followed up as soon as possible or go to the Emergency Department if any problems should occur.  Please show the CHEMOTHERAPY ALERT CARD or IMMUNOTHERAPY ALERT CARD at  check-in to the Emergency Department and triage nurse.  Should you have questions after your visit or need to cancel or reschedule your appointment, please contact [facility]  Dept: [phone]  and follow the prompts.  Office hours are 8:00 a.m. to 4:30 p.m. Monday - Friday. Please note that voicemails left after 4:00 p.m. may not be returned until the following business day.  We are closed weekends and major holidays. You have access to a nurse at all times for urgent questions. Please call the main number to the clinic Dept: [phone] and follow the prompts.   For any non-urgent questions, you may also contact your provider using MyChart. We now offer e-Visits for anyone 5 and older to request care online for non-urgent symptoms. For details visit [email or web address].   Also download the MyChart app! Go to the app store, search "MyChart", open the app, select [facility], and log in with your MyChart username and password.  Due to Covid, a mask is required upon entering the hospital/clinic. If you do not have a mask, one will be given to you upon arrival. For doctor visits, patients may have 1 support person aged 46 or older with them. For treatment visits, patients cannot have anyone with them due to current Covid guidelines and our immunocompromised population.   Nanoparticle Albumin-Bound Paclitaxel injection What is this medication? NANOPARTICLE ALBUMIN-BOUND PACLITAXEL (Na no PAHR ti kuhl  al BYOO muhn-bound  PAK li TAX el) is a chemotherapy drug. It targets fast dividing cells, like cancer cells, and causes these cells to die. This medicine is used  to treat advanced breast cancer, lung cancer, and pancreatic cancer. This medicine may be used for other purposes; ask your health care provider or pharmacist if you have questions. COMMON BRAND NAME(S): Abraxane What should I tell my care team before I take this medication? They need to know if you have any  of these conditions: kidney disease liver disease low blood counts, like low white cell, platelet, or red cell counts lung or breathing disease, like asthma tingling of the fingers or toes, or other nerve disorder an unusual or allergic reaction to paclitaxel, albumin, other chemotherapy, other medicines, foods, dyes, or preservatives pregnant or trying to get pregnant breast-feeding How should I use this medication? This drug is given as an infusion into a vein. It is administered in a hospital or clinic by a specially trained health care professional. Talk to your pediatrician regarding the use of this medicine in children. Special care may be needed. Overdosage: If you think you have taken too much of this medicine contact a poison control center or emergency room at once. NOTE: This medicine is only for you. Do not share this medicine with others. What if I miss a dose? It is important not to miss your dose. Call your doctor or health care professional if you are unable to keep an appointment. What may interact with this medication? This medicine may interact with the following medications: antiviral medicines for hepatitis, HIV or AIDS certain antibiotics like erythromycin and clarithromycin certain medicines for fungal infections like ketoconazole and itraconazole certain medicines for seizures like carbamazepine, phenobarbital, phenytoin gemfibrozil nefazodone rifampin St. John's wort This list may not describe all possible interactions. Give your health care provider a list of all the medicines, herbs, non-prescription drugs, or dietary supplements you use. Also tell them if you smoke, drink alcohol, or use illegal drugs. Some items may interact with your medicine. What should I watch for while using this medication? Your condition will be monitored carefully while you are receiving this medicine. You will need important blood work done while you are taking this medicine. This  medicine can cause serious allergic reactions. If you experience allergic reactions like skin rash, itching or hives, swelling of the face, lips, or tongue, tell your doctor or health care professional right away. In some cases, you may be given additional medicines to help with side effects. Follow all directions for their use. This drug may make you feel generally unwell. This is not uncommon, as chemotherapy can affect healthy cells as well as cancer cells. Report any side effects. Continue your course of treatment even though you feel ill unless your doctor tells you to stop. Call your doctor or health care professional for advice if you get a fever, chills or sore throat, or other symptoms of a cold or flu. Do not treat yourself. This drug decreases your body's ability to fight infections. Try to avoid being around people who are sick. This medicine may increase your risk to bruise or bleed. Call your doctor or health care professional if you notice any unusual bleeding. Be careful brushing and flossing your teeth or using a toothpick because you may get an infection or bleed more easily. If you have any dental work done, tell your dentist you are receiving this medicine. Avoid taking products that contain aspirin, acetaminophen, ibuprofen, naproxen, or ketoprofen unless instructed by your doctor. These medicines may hide a fever. Do not become pregnant while taking this medicine or for 6 months after stopping it. Women should  inform their doctor if they wish to become pregnant or think they might be pregnant. Men should not father a child while taking this medicine or for 3 months after stopping it. There is a potential for serious side effects to an unborn child. Talk to your health care professional or pharmacist for more information. Do not breast-feed an infant while taking this medicine or for 2 weeks after stopping it. This medicine may interfere with the ability to get pregnant or to father a  child. You should talk to your doctor or health care professional if you are concerned about your fertility. What side effects may I notice from receiving this medication? Side effects that you should report to your doctor or health care professional as soon as possible: allergic reactions like skin rash, itching or hives, swelling of the face, lips, or tongue breathing problems changes in vision fast, irregular heartbeat low blood pressure mouth sores pain, tingling, numbness in the hands or feet signs of decreased platelets or bleeding - bruising, pinpoint red spots on the skin, black, tarry stools, blood in the urine signs of decreased red blood cells - unusually weak or tired, feeling faint or lightheaded, falls signs of infection - fever or chills, cough, sore throat, pain or difficulty passing urine signs and symptoms of liver injury like dark yellow or brown urine; general ill feeling or flu-like symptoms; light-colored stools; loss of appetite; nausea; right upper belly pain; unusually weak or tired; yellowing of the eyes or skin swelling of the ankles, feet, hands unusually slow heartbeat Side effects that usually do not require medical attention (report to your doctor or health care professional if they continue or are bothersome): diarrhea hair loss loss of appetite nausea, vomiting tiredness This list may not describe all possible side effects. Call your doctor for medical advice about side effects. You may report side effects to FDA at 1-800-FDA-1088. Where should I keep my medication? This drug is given in a hospital or clinic and will not be stored at home. NOTE: This sheet is a summary. It may not cover all possible information. If you have questions about this medicine, talk to your doctor, pharmacist, or health care provider.  2022 Elsevier/Gold Standard (2017-08-02 00:00:00)  Gemcitabine injection What is this medication? GEMCITABINE (jem SYE ta been) is a  chemotherapy drug. This medicine is used to treat many types of cancer like breast cancer, lung cancer, pancreatic cancer, and ovarian cancer. This medicine may be used for other purposes; ask your health care provider or pharmacist if you have questions. COMMON BRAND NAME(S): Gemzar, Infugem What should I tell my care team before I take this medication? They need to know if you have any of these conditions: blood disorders infection kidney disease liver disease lung or breathing disease, like asthma recent or ongoing radiation therapy an unusual or allergic reaction to gemcitabine, other chemotherapy, other medicines, foods, dyes, or preservatives pregnant or trying to get pregnant breast-feeding How should I use this medication? This drug is given as an infusion into a vein. It is administered in a hospital or clinic by a specially trained health care professional. Talk to your pediatrician regarding the use of this medicine in children. Special care may be needed. Overdosage: If you think you have taken too much of this medicine contact a poison control center or emergency room at once. NOTE: This medicine is only for you. Do not share this medicine with others. What if I miss a dose? It is important not  to miss your dose. Call your doctor or health care professional if you are unable to keep an appointment. What may interact with this medication? medicines to increase blood counts like filgrastim, pegfilgrastim, sargramostim some other chemotherapy drugs like cisplatin vaccines Talk to your doctor or health care professional before taking any of these medicines: acetaminophen aspirin ibuprofen ketoprofen naproxen This list may not describe all possible interactions. Give your health care provider a list of all the medicines, herbs, non-prescription drugs, or dietary supplements you use. Also tell them if you smoke, drink alcohol, or use illegal drugs. Some items may interact with  your medicine. What should I watch for while using this medication? Visit your doctor for checks on your progress. This drug may make you feel generally unwell. This is not uncommon, as chemotherapy can affect healthy cells as well as cancer cells. Report any side effects. Continue your course of treatment even though you feel ill unless your doctor tells you to stop. In some cases, you may be given additional medicines to help with side effects. Follow all directions for their use. Call your doctor or health care professional for advice if you get a fever, chills or sore throat, or other symptoms of a cold or flu. Do not treat yourself. This drug decreases your body's ability to fight infections. Try to avoid being around people who are sick. This medicine may increase your risk to bruise or bleed. Call your doctor or health care professional if you notice any unusual bleeding. Be careful brushing and flossing your teeth or using a toothpick because you may get an infection or bleed more easily. If you have any dental work done, tell your dentist you are receiving this medicine. Avoid taking products that contain aspirin, acetaminophen, ibuprofen, naproxen, or ketoprofen unless instructed by your doctor. These medicines may hide a fever. Do not become pregnant while taking this medicine or for 6 months after stopping it. Women should inform their doctor if they wish to become pregnant or think they might be pregnant. Men should not father a child while taking this medicine and for 3 months after stopping it. There is a potential for serious side effects to an unborn child. Talk to your health care professional or pharmacist for more information. Do not breast-feed an infant while taking this medicine or for at least 1 week after stopping it. Men should inform their doctors if they wish to father a child. This medicine may lower sperm counts. Talk with your doctor or health care professional if you are  concerned about your fertility. What side effects may I notice from receiving this medication? Side effects that you should report to your doctor or health care professional as soon as possible: allergic reactions like skin rash, itching or hives, swelling of the face, lips, or tongue breathing problems pain, redness, or irritation at site where injected signs and symptoms of a dangerous change in heartbeat or heart rhythm like chest pain; dizziness; fast or irregular heartbeat; palpitations; feeling faint or lightheaded, falls; breathing problems signs of decreased platelets or bleeding - bruising, pinpoint red spots on the skin, black, tarry stools, blood in the urine signs of decreased red blood cells - unusually weak or tired, feeling faint or lightheaded, falls signs of infection - fever or chills, cough, sore throat, pain or difficulty passing urine signs and symptoms of kidney injury like trouble passing urine or change in the amount of urine signs and symptoms of liver injury like dark yellow or  brown urine; general ill feeling or flu-like symptoms; light-colored stools; loss of appetite; nausea; right upper belly pain; unusually weak or tired; yellowing of the eyes or skin swelling of ankles, feet, hands Side effects that usually do not require medical attention (report to your doctor or health care professional if they continue or are bothersome): constipation diarrhea hair loss loss of appetite nausea rash vomiting This list may not describe all possible side effects. Call your doctor for medical advice about side effects. You may report side effects to FDA at 1-800-FDA-1088. Where should I keep my medication? This drug is given in a hospital or clinic and will not be stored at home. NOTE: This sheet is a summary. It may not cover all possible information. If you have questions about this medicine, talk to your doctor, pharmacist, or health care provider.  2022 Elsevier/Gold  Standard (2018-02-22 00:00:00)

## 2021-12-24 NOTE — Progress Notes (Signed)
Patient seen by Lisa Thomas NP today  Vitals are within treatment parameters.  Labs reviewed by Lisa Thomas NP and are within treatment parameters.  Per physician team, patient is ready for treatment and there are NO modifications to the treatment plan.     

## 2021-12-24 NOTE — Progress Notes (Signed)
Patient presents for treatment. RN assessment completed along with the following:  Labs/vitals reviewed - Yes, and within treatment parameters.   Weight within 10% of previous measurement - Yes Oncology Treatment Attestation completed for current therapy- Yes, on date 07/07/21 Informed consent completed and reflects current therapy/intent - Yes, on date 07/30/21             Provider progress note reviewed - Today's provider note is not yet available. I reviewed the most recent oncology provider progress note in chart dated 12/09/21. Treatment/Antibody/Supportive plan reviewed - Yes, and there are no adjustments needed for today's treatment. S&H and other orders reviewed - Yes, and there are no additional orders identified. Previous treatment date reviewed - Yes, and the appropriate amount of time has elapsed between treatments. Clinic Hand Off Received from - Ned Card, NP and Merceda Elks, RN  Patient to proceed with treatment.

## 2021-12-24 NOTE — Progress Notes (Signed)
[facility] OFFICE PROGRESS NOTE   Diagnosis: Pancreas cancer  INTERVAL HISTORY:   Wendy Hoffman returns as scheduled.  She completed another cycle of gemcitabine/Abraxane 12/10/2021.  No significant nausea.  No mouth sores.  No diarrhea.  Pain is unchanged.  She continues morphine as needed.  Neuropathy symptoms unchanged.  She has had a few headaches over the past week, not daily.  No diplopia.  She had a recent fall.  She feels the left foot is weak.  Objective:  Vital signs in last 24 hours:  Blood pressure 140/90, pulse 91, temperature 97.8 F (36.6 C), temperature source Oral, resp. rate 18, height _0  (1.626 m), weight 180 lb 3.2 oz (81.7 kg), SpO2 100 %.    HEENT: No thrush or ulcers. Resp: Lungs clear bilaterally. Cardio: Regular rate and rhythm. GI: Abdomen soft and nontender.  No hepatomegaly.  No mass. Vascular: No leg edema. Neuro: Alert and oriented.  Follows commands.  Extraocular movements intact.  Slight weakness of the left foot, question mild weakness proximal left leg. Port-A-Cath without erythema.   Lab Results:  Lab Results  Component Value Date   WBC 4.2 12/24/2021   HGB 9.7 (L) 12/24/2021   HCT 29.2 (L) 12/24/2021   MCV 94.5 12/24/2021   PLT 181 12/24/2021   NEUTROABS 2.3 12/24/2021    Imaging:  No results found.  Medications: I have reviewed the patient's current medications.  Assessment/Plan: Pancreas cancer -CT abdomen/pelvis without contrast 02/16/2021-multiple large hypoechoic masses in the patient's liver consistent metastatic disease, ill-defined masslike area in the pancreatic head measuring approximate 4.7 cm, possible filling defect in the right ventricle. -EGD 02/19/2021-large infiltrative mass with bleeding in the second portion of the duodenum, appears to be arising near the ampulla, partially obstructive with friable mucosa.  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 -Flexible  sigmoidoscopy 02/19/2021-diverticulosis in the sigmoid colon, nonbleeding external and internal hemorrhoids -Cardiac CT 02/20/2021-mass in the mid RV attached to the interventricular septum measuring 16 mm x 6 mm and concerning for metastatic tumor -MRI abdomen with/without contrast 02/21/2021-mass in the posterior pancreatic head measuring 4.3 x 3.8 cm with obstruction of the central pancreatic duct and diffuse dilatation up to 6 mm consistent with pancreatic adenocarcinoma, liver lesions the largest mass of the central left lobe measuring 9.9 x 9.2 cm consistent with hepatic metastatic disease,  hepatomegaly. -Ultrasound-guided biopsy of a left liver lesion 02/24/2021-adenocarcinoma, morphologically similar to the duodenal biopsy; microsatellite stable, tumor mutation burden 1 -Negative genetic testing -Palliative radiation to the duodenal mass 02/25/2021-03/06/2021 -Cycle 1 FOLFOX 03/17/2021 -Cycle 2 FOLFOX 04/01/2021 -Cycle 3 FOLFIRINOX 04/14/2021 -Cycle 4 FOLFIRINOX 04/27/2021, Udenyca added -Cycle 5 FOLFIRINOX 05/12/2021, Udenyca -MRI abdomen 05/23/2021-decrease in size of pancreas mass and hepatic lesions, no progressive disease -Cycle 6 FOLFIRINOX 05/27/2021 -CT 06/17/2021-multiple liver metastases similar to her prior exam.  Pancreas mass not significantly changed. -CT 06/26/2021-stable pancreas mass, no significant change in size and number of known liver metastases. -CT abdomen/pelvis 07/12/2021-new gastrostomy tube.  Multiple liver masses slightly decreased in size from prior exam.  Pancreas head mass unchanged. -Cycle 1 gemcitabine/Abraxane 07/30/2021 -Cycle 2 gemcitabine/Abraxane 08/13/2021 -Cycle 3 gemcitabine 08/27/2021, Abraxane held due to significant bilateral toe pain, question neuropathy -Cycle 4 gemcitabine, Abraxane held due to neuropathy, 09/10/2021 -Cycle 5 gemcitabine, Abraxane held due to neuropathy, 09/24/2021 -Cycle 6 gemcitabine, Abraxane held due to neuropathy 10/16/2021 -CTs  10/29/2021-mild progression of multifocal liver metastases; mild increase in size of hypoenhancing mass within the head of the pancreas; new mild  pleural nodularity along the posterior lower lobes -Cycle 7 gemcitabine/Abraxane 11/03/2021 -Cycle 8 gemcitabine/Abraxane 11/23/2021 -Cycle 9 gemcitabine/Abraxane 12/10/2021 -Cycle 10 Gemcitabine/Abraxane 12/24/2021 2.  Iron deficiency anemia-improved -Feraheme 510 mg 02/20/2021 3.  Protein calorie malnutrition 4.  Possible right ventricular filling defect on noncontrast CT scan MRI cardiac morphology 02/21/2021-mass in the mid RV attached to the interventricular septum measures 16 mm x 6 mm.  Mass appears heterogeneous with area of enhancement on LGE imaging.  Mass is concerning for metastatic tumor.  Normal LV size and systolic function.  Normal RV size and systolic function. 5.  Hypertension 6.  History of CVA 7.  Hyperlipidemia 8.  Hypothyroidism 9.  Morbid obesity 10.  Asthma 11.  Migraines 12.  Pain secondary to #1 13.  Port-A-Cath placement 02/25/2021 14.  Hypokalemia 04/01/2021-started a potassium supplement 15.  Hospital admission 06/17/2021-intractable nausea and vomiting EGD 06/19/2021-diffuse gastritis, nonobstructing duodenal mass, biopsy of stomach-no malignancy or H. pylori, duodenal mass-adenocarcinoma 16.  Hospital admission 07/12/2021 with nausea/vomiting, abdominal pain, and hypokalemia 17.  Percutaneous gastrostomy tube placement 07/01/2021     Disposition: Wendy Hoffman appears unchanged.  She is on active treatment with gemcitabine/Abraxane every 2 weeks, overall tolerating well.  Plan to proceed with treatment today as scheduled.  Restaging CTs prior to next office visit.  CBC and chemistry panel reviewed.  Labs adequate to proceed with treatment.  She appears to have left foot drop, likely due to nerve compression from weight loss.  She had a recent fall.  We are adding a brain CT to restaging studies.  She understands to contact  the office with increased headaches, visual disturbance, increased left leg weakness.  She will return for an office visit in 2 weeks.  We are available to see her sooner if needed.  Plan reviewed with Dr. Benay Spice.    [name]   12/24/2021  11:06 AM

## 2021-12-25 LAB — CANCER ANTIGEN 19-9: CA 19-9: 13795 U/mL — ABNORMAL HIGH (ref 0–35)

## 2021-12-28 ENCOUNTER — Other Ambulatory Visit: Payer: Self-pay | Admitting: Nurse Practitioner

## 2021-12-28 DIAGNOSIS — C250 Malignant neoplasm of head of pancreas: Secondary | ICD-10-CM

## 2021-12-29 ENCOUNTER — Telehealth: Payer: Self-pay | Admitting: *Deleted

## 2021-12-29 NOTE — Telephone Encounter (Signed)
Left VM for patient that CT abd/pelvis scheduled at [facility] for 01/04/22 at 2:30 pm with 2:15 pm arrival. NPO after 1030 am and drink oral contrast at 12:30 and 1:30 pm. Also sent information on Mychart message w/request for her to confirm receipt via Mychart or return call. Insurance would not approve the CT head. Per provider, will reassess her on 1/26 appointment and if further imaging is needed will order MRI.

## 2022-01-01 ENCOUNTER — Other Ambulatory Visit: Payer: Self-pay | Admitting: Nurse Practitioner

## 2022-01-01 ENCOUNTER — Telehealth: Payer: Self-pay

## 2022-01-01 DIAGNOSIS — C259 Malignant neoplasm of pancreas, unspecified: Secondary | ICD-10-CM

## 2022-01-01 DIAGNOSIS — C250 Malignant neoplasm of head of pancreas: Secondary | ICD-10-CM

## 2022-01-01 MED ORDER — MORPHINE SULFATE ER 60 MG PO TBCR: 60.0000 mg | EXTENDED_RELEASE_TABLET | Freq: Two times a day (BID) | ORAL | 0 refills | Status: DC

## 2022-01-01 MED ORDER — MORPHINE SULFATE (CONCENTRATE) 10 MG/0.5ML PO SOLN: 10.0000 mg | ORAL | 0 refills | Status: DC | PRN

## 2022-01-01 NOTE — Telephone Encounter (Signed)
Patient called and left a voicemail. Patient needs  Morphine and Morphine Sulfate refill.Place on the provider desk.

## 2022-01-03 ENCOUNTER — Other Ambulatory Visit: Payer: Self-pay | Admitting: Oncology

## 2022-01-03 DIAGNOSIS — J4540 Moderate persistent asthma, uncomplicated: Secondary | ICD-10-CM

## 2022-01-04 ENCOUNTER — Other Ambulatory Visit: Payer: Self-pay

## 2022-01-04 ENCOUNTER — Ambulatory Visit (HOSPITAL_BASED_OUTPATIENT_CLINIC_OR_DEPARTMENT_OTHER)
Admission: RE | Admit: 2022-01-04 | Discharge: 2022-01-04 | Disposition: A | Discharge status: Home / Self Care | Payer: Medicaid Other | Source: Ambulatory Visit | Attending: Nurse Practitioner | Admitting: Nurse Practitioner

## 2022-01-04 DIAGNOSIS — C250 Malignant neoplasm of head of pancreas: Secondary | ICD-10-CM | POA: Insufficient documentation

## 2022-01-04 MED ORDER — HEPARIN SOD (PORK) LOCK FLUSH 100 UNIT/ML IV SOLN
500.0000 [IU] | Freq: Once | INTRAVENOUS | Status: AC
  Administered 2022-01-04: 500 [IU] via INTRAVENOUS

## 2022-01-04 MED ORDER — IOHEXOL 300 MG/ML  SOLN
75.0000 mL | Freq: Once | INTRAMUSCULAR | Status: AC | PRN
  Administered 2022-01-04: 75 mL via INTRAVENOUS

## 2022-01-04 MED ORDER — SODIUM CHLORIDE 0.9% FLUSH
10.0000 mL | INTRAVENOUS | Status: DC | PRN
  Filled 2022-01-04: qty 10

## 2022-01-07 ENCOUNTER — Encounter: Payer: Self-pay | Admitting: *Deleted

## 2022-01-07 ENCOUNTER — Inpatient Hospital Stay: Payer: Medicaid Other

## 2022-01-07 ENCOUNTER — Other Ambulatory Visit: Payer: Self-pay

## 2022-01-07 ENCOUNTER — Inpatient Hospital Stay (HOSPITAL_BASED_OUTPATIENT_CLINIC_OR_DEPARTMENT_OTHER): Payer: Medicaid Other | Admitting: Oncology

## 2022-01-07 VITALS — BP 146/86 | HR 73

## 2022-01-07 VITALS — BP 113/69 | HR 94 | Temp 97.8°F | Resp 18 | Ht 64.0 in | Wt 179.6 lb

## 2022-01-07 DIAGNOSIS — C259 Malignant neoplasm of pancreas, unspecified: Secondary | ICD-10-CM

## 2022-01-07 DIAGNOSIS — Z5111 Encounter for antineoplastic chemotherapy: Secondary | ICD-10-CM | POA: Diagnosis not present

## 2022-01-07 DIAGNOSIS — C250 Malignant neoplasm of head of pancreas: Secondary | ICD-10-CM

## 2022-01-07 LAB — CBC WITH DIFFERENTIAL (CANCER CENTER ONLY)
Abs Immature Granulocytes: 0.01 10*3/uL (ref 0.00–0.07)
Basophils Absolute: 0 10*3/uL (ref 0.0–0.1)
Basophils Relative: 0 %
Eosinophils Absolute: 0.6 10*3/uL — ABNORMAL HIGH (ref 0.0–0.5)
Eosinophils Relative: 11 %
HCT: 28.4 % — ABNORMAL LOW (ref 36.0–46.0)
Hemoglobin: 9.2 g/dL — ABNORMAL LOW (ref 12.0–15.0)
Immature Granulocytes: 0 %
Lymphocytes Relative: 18 %
Lymphs Abs: 0.9 10*3/uL (ref 0.7–4.0)
MCH: 30.7 pg (ref 26.0–34.0)
MCHC: 32.4 g/dL (ref 30.0–36.0)
MCV: 94.7 fL (ref 80.0–100.0)
Monocytes Absolute: 0.6 10*3/uL (ref 0.1–1.0)
Monocytes Relative: 13 %
Neutro Abs: 2.8 10*3/uL (ref 1.7–7.7)
Neutrophils Relative %: 58 %
Platelet Count: 197 10*3/uL (ref 150–400)
RBC: 3 MIL/uL — ABNORMAL LOW (ref 3.87–5.11)
RDW: 14.9 % (ref 11.5–15.5)
WBC Count: 4.8 10*3/uL (ref 4.0–10.5)
nRBC: 0 % (ref 0.0–0.2)

## 2022-01-07 LAB — CMP (CANCER CENTER ONLY)
ALT: 11 U/L (ref 0–44)
AST: 19 U/L (ref 15–41)
Albumin: 3.5 g/dL (ref 3.5–5.0)
Alkaline Phosphatase: 75 U/L (ref 38–126)
Anion gap: 7 (ref 5–15)
BUN: 11 mg/dL (ref 6–20)
CO2: 29 mmol/L (ref 22–32)
Calcium: 9 mg/dL (ref 8.9–10.3)
Chloride: 104 mmol/L (ref 98–111)
Creatinine: 1.01 mg/dL — ABNORMAL HIGH (ref 0.44–1.00)
GFR, Estimated: >60 mL/min (ref >60)
Glucose, Bld: 116 mg/dL — ABNORMAL HIGH (ref 70–99)
Potassium: 3.2 mmol/L — ABNORMAL LOW (ref 3.5–5.1)
Sodium: 140 mmol/L (ref 135–145)
Total Bilirubin: 0.3 mg/dL (ref 0.3–1.2)
Total Protein: 6 g/dL — ABNORMAL LOW (ref 6.5–8.1)

## 2022-01-07 MED ORDER — SODIUM CHLORIDE 0.9 % IV SOLN: Freq: Once | INTRAVENOUS | Status: AC

## 2022-01-07 MED ORDER — PROCHLORPERAZINE MALEATE 10 MG PO TABS
10.0000 mg | ORAL_TABLET | Freq: Once | ORAL | Status: AC
  Administered 2022-01-07: 10 mg via ORAL

## 2022-01-07 MED ORDER — LORAZEPAM 0.5 MG PO TABS
0.5000 mg | ORAL_TABLET | Freq: Every evening | ORAL | 0 refills | Status: DC | PRN
Start: 2022-01-07 — End: 2022-03-29

## 2022-01-07 MED ORDER — PACLITAXEL PROTEIN-BOUND CHEMO INJECTION 100 MG
100.0000 mg/m2 | Freq: Once | INTRAVENOUS | Status: AC
  Administered 2022-01-07: 200 mg via INTRAVENOUS
  Filled 2022-01-07: qty 40

## 2022-01-07 MED ORDER — SODIUM CHLORIDE 0.9 % IV SOLN
1000.0000 mg/m2 | Freq: Once | INTRAVENOUS | Status: AC
  Administered 2022-01-07: 1900 mg via INTRAVENOUS
  Filled 2022-01-07: qty 49.97

## 2022-01-07 MED ORDER — HEPARIN SOD (PORK) LOCK FLUSH 100 UNIT/ML IV SOLN
500.0000 [IU] | Freq: Once | INTRAVENOUS | Status: AC | PRN
  Administered 2022-01-07: 500 [IU]

## 2022-01-07 MED ORDER — SODIUM CHLORIDE 0.9% FLUSH
10.0000 mL | INTRAVENOUS | Status: DC | PRN
  Administered 2022-01-07: 10 mL

## 2022-01-07 NOTE — Patient Instructions (Signed)
[facility]   Discharge Instructions: Thank you for choosing [facility] to provide your oncology and hematology care.   If you have a lab appointment with the [facility], please go directly to the [facility] and check in at the registration area.   Wear comfortable clothing and clothing appropriate for easy access to any Portacath or PICC line.   We strive to give you quality time with your provider. You may need to reschedule your appointment if you arrive late (15 or more minutes).  Arriving late affects you and other patients whose appointments are after yours.  Also, if you miss three or more appointments without notifying the office, you may be dismissed from the clinic at the providers discretion.      For prescription refill requests, have your pharmacy contact our office and allow 72 hours for refills to be completed.    Today you received the following chemotherapy and/or immunotherapy agents Abraxane, Gemzar      To help prevent nausea and vomiting after your treatment, we encourage you to take your nausea medication as directed.  BELOW ARE SYMPTOMS THAT SHOULD BE REPORTED IMMEDIATELY: *FEVER GREATER THAN 100.4 F (38 C) OR HIGHER *CHILLS OR SWEATING *NAUSEA AND VOMITING THAT IS NOT CONTROLLED WITH YOUR NAUSEA MEDICATION *UNUSUAL SHORTNESS OF BREATH *UNUSUAL BRUISING OR BLEEDING *URINARY PROBLEMS (pain or burning when urinating, or frequent urination) *BOWEL PROBLEMS (unusual diarrhea, constipation, pain near the anus) TENDERNESS IN MOUTH AND THROAT WITH OR WITHOUT PRESENCE OF ULCERS (sore throat, sores in mouth, or a toothache) UNUSUAL RASH, SWELLING OR PAIN  UNUSUAL VAGINAL DISCHARGE OR ITCHING   Items with * indicate a potential emergency and should be followed up as soon as possible or go to the Emergency Department if any problems should occur.  Please show the CHEMOTHERAPY ALERT CARD or IMMUNOTHERAPY ALERT CARD at  check-in to the Emergency Department and triage nurse.  Should you have questions after your visit or need to cancel or reschedule your appointment, please contact [facility]  Dept: [phone]  and follow the prompts.  Office hours are 8:00 a.m. to 4:30 p.m. Monday - Friday. Please note that voicemails left after 4:00 p.m. may not be returned until the following business day.  We are closed weekends and major holidays. You have access to a nurse at all times for urgent questions. Please call the main number to the clinic Dept: [phone] and follow the prompts.   For any non-urgent questions, you may also contact your provider using MyChart. We now offer e-Visits for anyone 34 and older to request care online for non-urgent symptoms. For details visit [email or web address].   Also download the MyChart app! Go to the app store, search "MyChart", open the app, select [facility], and log in with your MyChart username and password.  Due to Covid, a mask is required upon entering the hospital/clinic. If you do not have a mask, one will be given to you upon arrival. For doctor visits, patients may have 1 support person aged 8 or older with them. For treatment visits, patients cannot have anyone with them due to current Covid guidelines and our immunocompromised population.   Nanoparticle Albumin-Bound Paclitaxel injection What is this medication? NANOPARTICLE ALBUMIN-BOUND PACLITAXEL (Na no PAHR ti kuhl  al BYOO muhn-bound  PAK li TAX el) is a chemotherapy drug. It targets fast dividing cells, like cancer cells, and causes these cells to die. This medicine is used  to treat advanced breast cancer, lung cancer, and pancreatic cancer. This medicine may be used for other purposes; ask your health care provider or pharmacist if you have questions. COMMON BRAND NAME(S): Abraxane What should I tell my care team before I take this medication? They need to know if you have any  of these conditions: kidney disease liver disease low blood counts, like low white cell, platelet, or red cell counts lung or breathing disease, like asthma tingling of the fingers or toes, or other nerve disorder an unusual or allergic reaction to paclitaxel, albumin, other chemotherapy, other medicines, foods, dyes, or preservatives pregnant or trying to get pregnant breast-feeding How should I use this medication? This drug is given as an infusion into a vein. It is administered in a hospital or clinic by a specially trained health care professional. Talk to your pediatrician regarding the use of this medicine in children. Special care may be needed. Overdosage: If you think you have taken too much of this medicine contact a poison control center or emergency room at once. NOTE: This medicine is only for you. Do not share this medicine with others. What if I miss a dose? It is important not to miss your dose. Call your doctor or health care professional if you are unable to keep an appointment. What may interact with this medication? This medicine may interact with the following medications: antiviral medicines for hepatitis, HIV or AIDS certain antibiotics like erythromycin and clarithromycin certain medicines for fungal infections like ketoconazole and itraconazole certain medicines for seizures like carbamazepine, phenobarbital, phenytoin gemfibrozil nefazodone rifampin St. John's wort This list may not describe all possible interactions. Give your health care provider a list of all the medicines, herbs, non-prescription drugs, or dietary supplements you use. Also tell them if you smoke, drink alcohol, or use illegal drugs. Some items may interact with your medicine. What should I watch for while using this medication? Your condition will be monitored carefully while you are receiving this medicine. You will need important blood work done while you are taking this medicine. This  medicine can cause serious allergic reactions. If you experience allergic reactions like skin rash, itching or hives, swelling of the face, lips, or tongue, tell your doctor or health care professional right away. In some cases, you may be given additional medicines to help with side effects. Follow all directions for their use. This drug may make you feel generally unwell. This is not uncommon, as chemotherapy can affect healthy cells as well as cancer cells. Report any side effects. Continue your course of treatment even though you feel ill unless your doctor tells you to stop. Call your doctor or health care professional for advice if you get a fever, chills or sore throat, or other symptoms of a cold or flu. Do not treat yourself. This drug decreases your body's ability to fight infections. Try to avoid being around people who are sick. This medicine may increase your risk to bruise or bleed. Call your doctor or health care professional if you notice any unusual bleeding. Be careful brushing and flossing your teeth or using a toothpick because you may get an infection or bleed more easily. If you have any dental work done, tell your dentist you are receiving this medicine. Avoid taking products that contain aspirin, acetaminophen, ibuprofen, naproxen, or ketoprofen unless instructed by your doctor. These medicines may hide a fever. Do not become pregnant while taking this medicine or for 6 months after stopping it. Women should  inform their doctor if they wish to become pregnant or think they might be pregnant. Men should not father a child while taking this medicine or for 3 months after stopping it. There is a potential for serious side effects to an unborn child. Talk to your health care professional or pharmacist for more information. Do not breast-feed an infant while taking this medicine or for 2 weeks after stopping it. This medicine may interfere with the ability to get pregnant or to father a  child. You should talk to your doctor or health care professional if you are concerned about your fertility. What side effects may I notice from receiving this medication? Side effects that you should report to your doctor or health care professional as soon as possible: allergic reactions like skin rash, itching or hives, swelling of the face, lips, or tongue breathing problems changes in vision fast, irregular heartbeat low blood pressure mouth sores pain, tingling, numbness in the hands or feet signs of decreased platelets or bleeding - bruising, pinpoint red spots on the skin, black, tarry stools, blood in the urine signs of decreased red blood cells - unusually weak or tired, feeling faint or lightheaded, falls signs of infection - fever or chills, cough, sore throat, pain or difficulty passing urine signs and symptoms of liver injury like dark yellow or brown urine; general ill feeling or flu-like symptoms; light-colored stools; loss of appetite; nausea; right upper belly pain; unusually weak or tired; yellowing of the eyes or skin swelling of the ankles, feet, hands unusually slow heartbeat Side effects that usually do not require medical attention (report to your doctor or health care professional if they continue or are bothersome): diarrhea hair loss loss of appetite nausea, vomiting tiredness This list may not describe all possible side effects. Call your doctor for medical advice about side effects. You may report side effects to FDA at 1-800-FDA-1088. Where should I keep my medication? This drug is given in a hospital or clinic and will not be stored at home. NOTE: This sheet is a summary. It may not cover all possible information. If you have questions about this medicine, talk to your doctor, pharmacist, or health care provider.  2022 Elsevier/Gold Standard (2017-08-02 00:00:00)  Gemcitabine injection What is this medication? GEMCITABINE (jem SYE ta been) is a  chemotherapy drug. This medicine is used to treat many types of cancer like breast cancer, lung cancer, pancreatic cancer, and ovarian cancer. This medicine may be used for other purposes; ask your health care provider or pharmacist if you have questions. COMMON BRAND NAME(S): Gemzar, Infugem What should I tell my care team before I take this medication? They need to know if you have any of these conditions: blood disorders infection kidney disease liver disease lung or breathing disease, like asthma recent or ongoing radiation therapy an unusual or allergic reaction to gemcitabine, other chemotherapy, other medicines, foods, dyes, or preservatives pregnant or trying to get pregnant breast-feeding How should I use this medication? This drug is given as an infusion into a vein. It is administered in a hospital or clinic by a specially trained health care professional. Talk to your pediatrician regarding the use of this medicine in children. Special care may be needed. Overdosage: If you think you have taken too much of this medicine contact a poison control center or emergency room at once. NOTE: This medicine is only for you. Do not share this medicine with others. What if I miss a dose? It is important not  to miss your dose. Call your doctor or health care professional if you are unable to keep an appointment. What may interact with this medication? medicines to increase blood counts like filgrastim, pegfilgrastim, sargramostim some other chemotherapy drugs like cisplatin vaccines Talk to your doctor or health care professional before taking any of these medicines: acetaminophen aspirin ibuprofen ketoprofen naproxen This list may not describe all possible interactions. Give your health care provider a list of all the medicines, herbs, non-prescription drugs, or dietary supplements you use. Also tell them if you smoke, drink alcohol, or use illegal drugs. Some items may interact with  your medicine. What should I watch for while using this medication? Visit your doctor for checks on your progress. This drug may make you feel generally unwell. This is not uncommon, as chemotherapy can affect healthy cells as well as cancer cells. Report any side effects. Continue your course of treatment even though you feel ill unless your doctor tells you to stop. In some cases, you may be given additional medicines to help with side effects. Follow all directions for their use. Call your doctor or health care professional for advice if you get a fever, chills or sore throat, or other symptoms of a cold or flu. Do not treat yourself. This drug decreases your body's ability to fight infections. Try to avoid being around people who are sick. This medicine may increase your risk to bruise or bleed. Call your doctor or health care professional if you notice any unusual bleeding. Be careful brushing and flossing your teeth or using a toothpick because you may get an infection or bleed more easily. If you have any dental work done, tell your dentist you are receiving this medicine. Avoid taking products that contain aspirin, acetaminophen, ibuprofen, naproxen, or ketoprofen unless instructed by your doctor. These medicines may hide a fever. Do not become pregnant while taking this medicine or for 6 months after stopping it. Women should inform their doctor if they wish to become pregnant or think they might be pregnant. Men should not father a child while taking this medicine and for 3 months after stopping it. There is a potential for serious side effects to an unborn child. Talk to your health care professional or pharmacist for more information. Do not breast-feed an infant while taking this medicine or for at least 1 week after stopping it. Men should inform their doctors if they wish to father a child. This medicine may lower sperm counts. Talk with your doctor or health care professional if you are  concerned about your fertility. What side effects may I notice from receiving this medication? Side effects that you should report to your doctor or health care professional as soon as possible: allergic reactions like skin rash, itching or hives, swelling of the face, lips, or tongue breathing problems pain, redness, or irritation at site where injected signs and symptoms of a dangerous change in heartbeat or heart rhythm like chest pain; dizziness; fast or irregular heartbeat; palpitations; feeling faint or lightheaded, falls; breathing problems signs of decreased platelets or bleeding - bruising, pinpoint red spots on the skin, black, tarry stools, blood in the urine signs of decreased red blood cells - unusually weak or tired, feeling faint or lightheaded, falls signs of infection - fever or chills, cough, sore throat, pain or difficulty passing urine signs and symptoms of kidney injury like trouble passing urine or change in the amount of urine signs and symptoms of liver injury like dark yellow or  brown urine; general ill feeling or flu-like symptoms; light-colored stools; loss of appetite; nausea; right upper belly pain; unusually weak or tired; yellowing of the eyes or skin swelling of ankles, feet, hands Side effects that usually do not require medical attention (report to your doctor or health care professional if they continue or are bothersome): constipation diarrhea hair loss loss of appetite nausea rash vomiting This list may not describe all possible side effects. Call your doctor for medical advice about side effects. You may report side effects to FDA at 1-800-FDA-1088. Where should I keep my medication? This drug is given in a hospital or clinic and will not be stored at home. NOTE: This sheet is a summary. It may not cover all possible information. If you have questions about this medicine, talk to your doctor, pharmacist, or health care provider.  2022 Elsevier/Gold  Standard (2018-02-22 00:00:00)

## 2022-01-07 NOTE — Progress Notes (Signed)
Patient seen by Dr. Benay Spice today .  Labs reviewed by Dr. Benay Spice and are within treatment parameters. Vitals are within treatment parameters. K+ 3.2 .She reports not taking oral K+ for ~ 1 month. Agrees to resume 10 meq #2 bid  Per physician team, patient is ready for treatment and there are NO modifications to the treatment plan.

## 2022-01-07 NOTE — Progress Notes (Signed)
Patient presents for treatment. RN assessment completed along with the following:  Labs/vitals reviewed - Yes, and within treatment parameters.   Weight within 10% of previous measurement - Yes Informed consent completed and reflects current therapy/intent - Yes, on date 07/30/21             Provider progress note reviewed - Yes, today's provider note was reviewed. Treatment/Antibody/Supportive plan reviewed - Yes, and there are no adjustments needed for today's treatment. S&H and other orders reviewed - Yes, and there are no additional orders identified. Previous treatment date reviewed - Yes, and the appropriate amount of time has elapsed between treatments. Clinic Hand Off Received from - Cristy Friedlander, RN  Patient to proceed with treatment.

## 2022-01-07 NOTE — Addendum Note (Signed)
Addended by: Tania Ade on: 01/07/2022 11:36 AM   Modules accepted: Orders

## 2022-01-07 NOTE — Progress Notes (Signed)
Port accessed by infusion nurse sluggish return noted. Reported only 6 ml return. Port flushed a couple of times 10 ml of blood returned. Labs drawn from port. Tubes sent to the lab.

## 2022-01-07 NOTE — Progress Notes (Signed)
[facility] OFFICE PROGRESS NOTE   Diagnosis: Pancreas cancer  INTERVAL HISTORY:   Wendy Hoffman returns as scheduled.  She complete another treatment with gemcitabine and Abraxane on 12/24/2021.  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  Stable abdominal pain.  Good appetite.  She is not vomiting frequently.  She has insomnia.  This is not relieved with Ambien.  Objective:  Vital signs in last 24 hours:  Blood pressure 113/69, pulse 94, temperature 97.8 F (36.6 C), temperature source Oral, resp. rate 18, height '5\' 4"'  (1.626 m), weight 179 lb 9.6 oz (81.5 kg), SpO2 100 %.    HEENT: Mild white coat over the tongue, 2 mm ulcer at the posterior left buccal mucosa Resp: Lungs clear bilaterally Cardio: Regular rate and rhythm GI: Fullness in the right upper abdomen with associated tenderness Vascular: No leg edema   Portacath/PICC-without erythema  Lab Results:  Lab Results  Component Value Date   WBC 4.8 01/07/2022   HGB 9.2 (L) 01/07/2022   HCT 28.4 (L) 01/07/2022   MCV 94.7 01/07/2022   PLT 197 01/07/2022   NEUTROABS 2.8 01/07/2022    CMP  Lab Results  Component Value Date   NA 141 12/24/2021   K 3.5 12/24/2021   CL 105 12/24/2021   CO2 28 12/24/2021   GLUCOSE 101 (H) 12/24/2021   BUN 16 12/24/2021   CREATININE 0.90 12/24/2021   CALCIUM 9.1 12/24/2021   PROT 6.1 (L) 12/24/2021   ALBUMIN 3.5 12/24/2021   AST 34 12/24/2021   ALT 15 12/24/2021   ALKPHOS 98 12/24/2021   BILITOT 0.3 12/24/2021   GFRNONAA >60 12/24/2021   GFRAA >60 07/21/2020    Lab Results  Component Value Date   CEA1 68.8 (H) 02/23/2021   CEA <0.5 10/15/2008   KAJ681 13,795 (H) 12/24/2021      Imaging:  CT Abdomen Pelvis W Contrast  Result Date: 01/05/2022 CLINICAL DATA:  Restaging pancreatic cancer EXAM: CT ABDOMEN AND PELVIS WITH CONTRAST TECHNIQUE: Multidetector CT imaging of the abdomen and pelvis was performed using the standard protocol following bolus administration of  intravenous contrast. RADIATION DOSE REDUCTION: This exam was performed according to the departmental dose-optimization program which includes automated exposure control, adjustment of the mA and/or kV according to patient size and/or use of iterative reconstruction technique. CONTRAST:  54m OMNIPAQUE IOHEXOL 300 MG/ML  SOLN COMPARISON:  None. FINDINGS: Lower chest: No suspicious pulmonary nodularity. Hepatobiliary: Again demonstrated multifocal hepatic metastasis. No appreciable visual change number size of lesions. Dominant lesion the RIGHT hepatic lobe measuring 9.6 x 5.9 cm compares to 8.0 x 6.0 cm. Lesion in the central liver adjacent to the caudate measures 2.4 x 2.7 cm compared to 3.0 x 2.6 cm. Small subcapsular lesion in the inferior RIGHT hepatic lobe measures 2.3 cm (image 32/2) compared to 1.7 cm. No new lesions identified No periportal adenopathy Pancreas: Pancreatic head mass lesion decreased in volume measuring approximately 3.1 x 2.3 cm (image 37/series 2) compared to 3.7 x 2.4 cm. There is atrophy of the body and tail the pancreas. No duct dilatation. Common bile duct is mildly dilated similar prior Spleen: Normal spleen Adrenals/urinary tract: Adrenal glands and kidneys are normal. The ureters and bladder normal. Stomach/Bowel: Stomach, small bowel, appendix, and cecum are normal. The colon and rectosigmoid colon are normal. Vascular/Lymphatic: Abdominal aorta is normal caliber. No periportal or retroperitoneal adenopathy. No pelvic adenopathy. Reproductive: Normal mediastinum and cardiac silhouette. Lungs are hyperinflated. Normal pulmonary vasculature. No evidence of effusion, infiltrate, or pneumothorax.  No acute bony abnormality. Other: no peritoneal metastasis.  No omental metastasis Musculoskeletal: Sclerotic lesion beneath the superior endplate of the L3 vertebral body measures 9 mm (69/6) not changed prior. IMPRESSION: 1. Interval decrease in size of pancreatic head mass. 2. Multifocal  hepatic metastasis not significant changed in size or number compared to prior. 3. No metastatic lymphadenopathy. 4. No omental metastasis or peritoneal metastasis 5. No pulmonary metastasis. 6. Stable round sclerotic lesion in the L3 vertebral body Electronically Signed   By: Suzy Bouchard M.D.   On: 01/05/2022 16:33    Medications: I have reviewed the patient's current medications.   Assessment/Plan: Pancreas cancer -CT abdomen/pelvis without contrast 02/16/2021-multiple large hypoechoic masses in the patient's liver consistent metastatic disease, ill-defined masslike area in the pancreatic head measuring approximate 4.7 cm, possible filling defect in the right ventricle. -EGD 02/19/2021-large infiltrative mass with bleeding in the second portion of the duodenum, appears to be arising near the ampulla, partially obstructive with friable mucosa.  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 -Flexible sigmoidoscopy 02/19/2021-diverticulosis in the sigmoid colon, nonbleeding external and internal hemorrhoids -Cardiac CT 02/20/2021-mass in the mid RV attached to the interventricular septum measuring 16 mm x 6 mm and concerning for metastatic tumor -MRI abdomen with/without contrast 02/21/2021-mass in the posterior pancreatic head measuring 4.3 x 3.8 cm with obstruction of the central pancreatic duct and diffuse dilatation up to 6 mm consistent with pancreatic adenocarcinoma, liver lesions the largest mass of the central left lobe measuring 9.9 x 9.2 cm consistent with hepatic metastatic disease,  hepatomegaly. -Ultrasound-guided biopsy of a left liver lesion 02/24/2021-adenocarcinoma, morphologically similar to the duodenal biopsy; microsatellite stable, tumor mutation burden 1 -Negative genetic testing -Palliative radiation to the duodenal mass 02/25/2021-03/06/2021 -Cycle 1 FOLFOX 03/17/2021 -Cycle 2 FOLFOX 04/01/2021 -Cycle 3 FOLFIRINOX 04/14/2021 -Cycle 4 FOLFIRINOX 04/27/2021,  Udenyca added -Cycle 5 FOLFIRINOX 05/12/2021, Udenyca -MRI abdomen 05/23/2021-decrease in size of pancreas mass and hepatic lesions, no progressive disease -Cycle 6 FOLFIRINOX 05/27/2021 -CT 06/17/2021-multiple liver metastases similar to her prior exam.  Pancreas mass not significantly changed. -CT 06/26/2021-stable pancreas mass, no significant change in size and number of known liver metastases. -CT abdomen/pelvis 07/12/2021-new gastrostomy tube.  Multiple liver masses slightly decreased in size from prior exam.  Pancreas head mass unchanged. -Cycle 1 gemcitabine/Abraxane 07/30/2021 -Cycle 2 gemcitabine/Abraxane 08/13/2021 -Cycle 3 gemcitabine 08/27/2021, Abraxane held due to significant bilateral toe pain, question neuropathy -Cycle 4 gemcitabine, Abraxane held due to neuropathy, 09/10/2021 -Cycle 5 gemcitabine, Abraxane held due to neuropathy, 09/24/2021 -Cycle 6 gemcitabine, Abraxane held due to neuropathy 10/16/2021 -CTs 10/29/2021-mild progression of multifocal liver metastases; mild increase in size of hypoenhancing mass within the head of the pancreas; new mild pleural nodularity along the posterior lower lobes -Cycle 7 gemcitabine/Abraxane 11/03/2021 -Cycle 8 gemcitabine/Abraxane 11/23/2021 -Cycle 9 gemcitabine/Abraxane 12/10/2021 -Cycle 10 Gemcitabine/Abraxane 12/24/2021 -CT abdomen/pelvis 01/04/2022-decrease size of pancreas head mass, no change in multifocal hepatic metastases -Cycle 11 gemcitabine/Abraxane 01/07/2022 2.  Iron deficiency anemia-improved -Feraheme 510 mg 02/20/2021 3.  Protein calorie malnutrition 4.  Possible right ventricular filling defect on noncontrast CT scan MRI cardiac morphology 02/21/2021-mass in the mid RV attached to the interventricular septum measures 16 mm x 6 mm.  Mass appears heterogeneous with area of enhancement on LGE imaging.  Mass is concerning for metastatic tumor.  Normal LV size and systolic function.  Normal RV size and systolic function. 5.   Hypertension 6.  History of CVA 7.  Hyperlipidemia 8.  Hypothyroidism 9.  Morbid obesity 10.  Asthma  11.  Migraines 12.  Pain secondary to #1 13.  Port-A-Cath placement 02/25/2021 14.  Hypokalemia 04/01/2021-started a potassium supplement 15.  Hospital admission 06/17/2021-intractable nausea and vomiting EGD 06/19/2021-diffuse gastritis, nonobstructing duodenal mass, biopsy of stomach-no malignancy or H. pylori, duodenal mass-adenocarcinoma 16.  Hospital admission 07/12/2021 with nausea/vomiting, abdominal pain, and hypokalemia 17.  Percutaneous gastrostomy tube placement 07/01/2021      Disposition: Wendy Hoffman appears stable.  I reviewed the CT findings and images with her.  The overall pattern is consistent with stable disease.  The CA 19-9 is stable.  I recommend continuing gemcitabine/Abraxane.  She will complete another cycle today.  She will discontinue Ambien.  She will begin a trial of lorazepam for insomnia.  Wendy Hoffman will return for an office visit and chemotherapy in 2 weeks.  Betsy Coder, MD  01/07/2022  11:27 AM

## 2022-01-07 NOTE — Patient Instructions (Signed)

## 2022-01-08 LAB — CANCER ANTIGEN 19-9: CA 19-9: 15311 U/mL — ABNORMAL HIGH (ref 0–35)

## 2022-01-11 ENCOUNTER — Telehealth: Payer: Self-pay | Admitting: *Deleted

## 2022-01-11 MED ORDER — FLUCONAZOLE 100 MG PO TABS: 100.0000 mg | ORAL_TABLET | Freq: Every day | ORAL | 0 refills | Status: DC

## 2022-01-11 NOTE — Telephone Encounter (Signed)
VM that thrush has returned. Requests script. Per Ned Card, NP: send in fluconazole 100 mg qd x 4 days Notified patient that script has been sent.

## 2022-01-21 ENCOUNTER — Inpatient Hospital Stay: Payer: Medicaid Other | Admitting: Nurse Practitioner

## 2022-01-21 ENCOUNTER — Inpatient Hospital Stay: Payer: Medicaid Other

## 2022-01-22 ENCOUNTER — Telehealth: Payer: Self-pay | Admitting: Nurse Practitioner

## 2022-01-22 NOTE — Telephone Encounter (Signed)
Attempted to contact patient to advise what days we have open since she left a voicemail about cancelling appointments and rescheduling appointments. No answer but voicemail was left for patient to call back.

## 2022-01-23 ENCOUNTER — Other Ambulatory Visit: Payer: Self-pay | Admitting: Oncology

## 2022-01-25 ENCOUNTER — Encounter: Payer: Self-pay | Admitting: Oncology

## 2022-01-25 ENCOUNTER — Telehealth: Payer: Self-pay | Admitting: *Deleted

## 2022-01-25 MED ORDER — DULOXETINE HCL 30 MG PO CPEP: ORAL_CAPSULE | ORAL | 3 refills | Status: DC

## 2022-01-25 NOTE — Telephone Encounter (Signed)
Patient reports that the lorazepam 1 mg nightly does not help her sleep. She reports using Ambien 10 mg in past with good results. Asking if MD will order this for her.

## 2022-02-02 ENCOUNTER — Emergency Department (HOSPITAL_BASED_OUTPATIENT_CLINIC_OR_DEPARTMENT_OTHER): Payer: Medicaid Other | Admitting: Radiology

## 2022-02-02 ENCOUNTER — Other Ambulatory Visit: Payer: Self-pay | Admitting: Oncology

## 2022-02-02 ENCOUNTER — Telehealth: Payer: Self-pay

## 2022-02-02 ENCOUNTER — Encounter (HOSPITAL_BASED_OUTPATIENT_CLINIC_OR_DEPARTMENT_OTHER): Payer: Self-pay

## 2022-02-02 ENCOUNTER — Other Ambulatory Visit: Payer: Self-pay | Admitting: Nurse Practitioner

## 2022-02-02 ENCOUNTER — Other Ambulatory Visit: Payer: Self-pay

## 2022-02-02 ENCOUNTER — Emergency Department (HOSPITAL_BASED_OUTPATIENT_CLINIC_OR_DEPARTMENT_OTHER)
Admission: EM | Admit: 2022-02-02 | Discharge: 2022-02-02 | Disposition: A | Discharge status: Home / Self Care | Payer: Medicaid Other | Attending: Emergency Medicine | Admitting: Emergency Medicine

## 2022-02-02 DIAGNOSIS — C250 Malignant neoplasm of head of pancreas: Secondary | ICD-10-CM

## 2022-02-02 DIAGNOSIS — Z8541 Personal history of malignant neoplasm of cervix uteri: Secondary | ICD-10-CM | POA: Diagnosis not present

## 2022-02-02 DIAGNOSIS — E039 Hypothyroidism, unspecified: Secondary | ICD-10-CM | POA: Diagnosis not present

## 2022-02-02 DIAGNOSIS — Z20822 Contact with and (suspected) exposure to covid-19: Secondary | ICD-10-CM | POA: Insufficient documentation

## 2022-02-02 DIAGNOSIS — R112 Nausea with vomiting, unspecified: Secondary | ICD-10-CM | POA: Insufficient documentation

## 2022-02-02 DIAGNOSIS — G8929 Other chronic pain: Secondary | ICD-10-CM | POA: Insufficient documentation

## 2022-02-02 DIAGNOSIS — R079 Chest pain, unspecified: Secondary | ICD-10-CM | POA: Insufficient documentation

## 2022-02-02 DIAGNOSIS — I10 Essential (primary) hypertension: Secondary | ICD-10-CM | POA: Diagnosis not present

## 2022-02-02 DIAGNOSIS — C259 Malignant neoplasm of pancreas, unspecified: Secondary | ICD-10-CM

## 2022-02-02 DIAGNOSIS — J45909 Unspecified asthma, uncomplicated: Secondary | ICD-10-CM | POA: Diagnosis not present

## 2022-02-02 DIAGNOSIS — D649 Anemia, unspecified: Secondary | ICD-10-CM | POA: Insufficient documentation

## 2022-02-02 LAB — BASIC METABOLIC PANEL
Anion gap: 9 (ref 5–15)
BUN: 15 mg/dL (ref 6–20)
CO2: 28 mmol/L (ref 22–32)
Calcium: 9.9 mg/dL (ref 8.9–10.3)
Chloride: 103 mmol/L (ref 98–111)
Creatinine, Ser: 0.94 mg/dL (ref 0.44–1.00)
GFR, Estimated: >60 mL/min (ref >60)
Glucose, Bld: 94 mg/dL (ref 70–99)
Potassium: 4.3 mmol/L (ref 3.5–5.1)
Sodium: 140 mmol/L (ref 135–145)

## 2022-02-02 LAB — TROPONIN I (HIGH SENSITIVITY): Troponin I (High Sensitivity): 4 ng/L (ref <18)

## 2022-02-02 LAB — CBC
HCT: 32 % — ABNORMAL LOW (ref 36.0–46.0)
Hemoglobin: 10.5 g/dL — ABNORMAL LOW (ref 12.0–15.0)
MCH: 30.8 pg (ref 26.0–34.0)
MCHC: 32.8 g/dL (ref 30.0–36.0)
MCV: 93.8 fL (ref 80.0–100.0)
Platelets: 299 10*3/uL (ref 150–400)
RBC: 3.41 MIL/uL — ABNORMAL LOW (ref 3.87–5.11)
RDW: 15.9 % — ABNORMAL HIGH (ref 11.5–15.5)
WBC: 9.1 10*3/uL (ref 4.0–10.5)
nRBC: 0 % (ref 0.0–0.2)

## 2022-02-02 LAB — RESP PANEL BY RT-PCR (FLU A&B, COVID) ARPGX2
Influenza A by PCR: NEGATIVE
Influenza B by PCR: NEGATIVE
SARS Coronavirus 2 by RT PCR: NEGATIVE

## 2022-02-02 MED ORDER — HYDROCODONE-ACETAMINOPHEN 5-325 MG PO TABS
1.0000 | ORAL_TABLET | Freq: Once | ORAL | Status: AC
  Administered 2022-02-02: 1 via ORAL
  Filled 2022-02-02: qty 1

## 2022-02-02 MED ORDER — MORPHINE SULFATE ER 60 MG PO TBCR: 60.0000 mg | EXTENDED_RELEASE_TABLET | Freq: Two times a day (BID) | ORAL | 0 refills | Status: DC

## 2022-02-02 MED ORDER — SODIUM CHLORIDE 0.9 % IV BOLUS
1000.0000 mL | Freq: Once | INTRAVENOUS | Status: AC
  Administered 2022-02-02: 1000 mL via INTRAVENOUS

## 2022-02-02 MED ORDER — MORPHINE SULFATE (CONCENTRATE) 10 MG/0.5ML PO SOLN: 10.0000 mg | ORAL | 0 refills | Status: DC | PRN

## 2022-02-02 MED ORDER — HEPARIN SOD (PORK) LOCK FLUSH 100 UNIT/ML IV SOLN
500.0000 [IU] | Freq: Once | INTRAVENOUS | Status: AC
  Administered 2022-02-02: 500 [IU]
  Filled 2022-02-02: qty 5

## 2022-02-02 MED ORDER — MORPHINE SULFATE (PF) 4 MG/ML IV SOLN
4.0000 mg | Freq: Once | INTRAVENOUS | Status: AC
  Administered 2022-02-02: 4 mg via INTRAVENOUS
  Filled 2022-02-02: qty 1

## 2022-02-02 MED ORDER — ONDANSETRON 4 MG PO TBDP: 4.0000 mg | ORAL_TABLET | Freq: Three times a day (TID) | ORAL | 0 refills | Status: DC | PRN

## 2022-02-02 MED ORDER — ONDANSETRON HCL 4 MG/2ML IJ SOLN
4.0000 mg | Freq: Once | INTRAMUSCULAR | Status: AC
  Administered 2022-02-02: 4 mg via INTRAVENOUS
  Filled 2022-02-02: qty 2

## 2022-02-02 NOTE — ED Notes (Signed)
Pt tolerating PO intake with no reported difficulty

## 2022-02-02 NOTE — Telephone Encounter (Signed)
TC from Pt stating she has been vomiting since yesterday, she stated she vomited 5 times and twice today. Pt states she had a temp of 100.3 yesterday and it's normal today. Pt denied sore throat. Pt stated she took an at home covid test which was negative. Pt stated she has had chest pain which feels like someone is stabbing her in the chest. Pt's last treatment was 4 weeks ago. Pt also requesting medication refills for ms contin and morphine as well as prescription for compazine. Pt informed to go to the Emergency Room and discussed with providers who agreed. Pt verbalized understanding.

## 2022-02-02 NOTE — Telephone Encounter (Signed)
Pt left a voicemail request a refill of Morphine Sulfate. The request was place on the provider desk.

## 2022-02-02 NOTE — Discharge Instructions (Addendum)
Your work-up today was reassuring.  Your chest x-ray, EKG, or blood work did not show anything concerning or serious cause of your chest pain.  He also mention this chest pain has been longstanding and is not new.  Your nausea improved after medicine in the emergency room.  He also received IV hydration.  You are able to tolerate fluids and crackers in the emergency room without difficulty.  I have sent in Zofran to the pharmacy for you to take for nausea.  Recommend you follow-up with your oncologist.  Since you do not have established primary care provider have attached [facility] information above for you to establish with.  If you have worsening symptoms please return to the emergency room.

## 2022-02-02 NOTE — ED Triage Notes (Signed)
Patient here POV from Home with CP and Emesis.  States she has been having Nausea/Emesis as well as Mid-Chest Pain for approximately 3 days. CP radiates around through to Back.  No SOB. Patient had Temperature of 100.3 last PM. Patient has Pancreatic Cancer and last Chemotherapy Treatment was 3 weeks ago.  NAD noted during Triage. A&Ox4. GCS 15. Ambulatory with Cane.

## 2022-02-02 NOTE — ED Provider Notes (Signed)
[facility] EMERGENCY DEPT Provider Note   CSN: [id number] Arrival date & time: 02/02/22  1609     History  Chief Complaint  Patient presents with   Chest Pain    Wendy Hoffman is a 56 y.o. female.  56 year old female with past medical history of pancreatic cancer presents today for evaluation of 3-day duration of chest pain, nausea, and vomiting.  States she has not been able to tolerate p.o. intake over the past few days.  Last intake earlier today when she was not able to keep down.  States she reached out to her oncologist who recommended she come into the emergency room for evaluation.  Last treatment was 2 weeks ago.  Pain radiates to her back otherwise without radiation.  Denies lightheadedness, palpitations.  Without peripheral edema, or lower extremity pain.  Denies shortness of breath.  The history is provided by the patient. No language interpreter was used.      Home Medications Prior to Admission medications   Medication Sig Start Date End Date Taking? Authorizing Provider  albuterol (VENTOLIN HFA) 108 (90 Base) MCG/ACT inhaler INHALE 1 TO 2 PUFFS BY MOUTH EVERY 6 HOURS AS NEEDED FOR WHEEZING AND FOR SHORTNESS OF BREATH 01/06/21 01/06/22  [place], Joelene Millin, NP  alum & mag hydroxide-simeth (MAALOX/MYLANTA) 200-200-20 MG/5ML suspension Take 30 mLs by mouth every 6 (six) hours as needed for indigestion or heartburn. 07/07/21   [place] Donnamarie Poag, DO  bisacodyl (DULCOLAX) 5 MG EC tablet Take 2 tablets (10 mg total) by mouth daily. 07/08/21   [place] Eric J, DO  diphenhydramine-acetaminophen (TYLENOL PM) 25-500 MG TABS tablet Take 1 tablet by mouth at bedtime as needed (sleep). Patient not taking: Reported on 12/24/2021    [provider]  DULoxetine (CYMBALTA) 30 MG capsule TAKE 2 CAPSULES BY MOUTH DAILY 01/25/22   Ladell Pier, MD  fluconazole (DIFLUCAN) 100 MG tablet Take 1 tablet (100 mg total) by mouth daily. X 4 days 01/11/22   Ladell Pier, MD   gabapentin (NEURONTIN) 300 MG capsule nightly.    [provider]  lidocaine-prilocaine (EMLA) cream APPLY 1 APPLICATION TOPICALLY TO THE AFFECTED AREA AS NEEDED 09/28/21   Ladell Pier, MD  LORazepam (ATIVAN) 0.5 MG tablet Take 1-2 tablets (0.5-1 mg total) by mouth at bedtime as needed for anxiety. 01/07/22   Ladell Pier, MD  morphine (MS CONTIN) 60 MG 12 hr tablet Take 1 tablet (60 mg total) by mouth every 12 (twelve) hours. 02/02/22   Owens Shark, NP  Morphine Sulfate (MORPHINE CONCENTRATE) 10 MG/0.5ML SOLN concentrated solution Place 0.5-1 mLs (10-20 mg total) under the tongue every 4 (four) hours as needed for severe pain. 02/02/22   Owens Shark, NP  pantoprazole (PROTONIX) 40 MG tablet TAKE 1 TABLET(40 MG) BY MOUTH TWICE DAILY 11/23/21   Ladell Pier, MD  potassium chloride (MICRO-K) 10 MEQ CR capsule Take 2 capsules (20 mEq total) by mouth 2 (two) times daily. Patient not taking: Reported on 12/24/2021 06/05/21   Ladell Pier, MD  prochlorperazine (COMPAZINE) 10 MG tablet TAKE 1 TABLET(10 MG) BY MOUTH EVERY 6 HOURS AS NEEDED FOR NAUSEA OR VOMITING 02/02/22   Owens Shark, NP  scopolamine (TRANSDERM-SCOP) 1 MG/3DAYS Place 1 patch (1.5 mg total) onto the skin every 3 (three) days. 07/21/21   Ladell Pier, MD  SYMBICORT 160-4.5 MCG/ACT inhaler INHALE 2 PUFFS INTO THE LUNGS TWICE DAILY 01/04/22   Ladell Pier, MD  Allergies    Blueberry [vaccinium angustifolium], Mango flavor, Oxaliplatin, Reglan [metoclopramide], Aspirin, and Penicillins    Review of Systems   Review of Systems  Constitutional:  Negative for chills and fever.  Respiratory:  Negative for shortness of breath.   Cardiovascular:  Positive for chest pain. Negative for palpitations and leg swelling.  Gastrointestinal:  Positive for nausea and vomiting. Negative for abdominal pain.  Genitourinary:  Negative for difficulty urinating, dysuria and hematuria.  Neurological:  Negative for  weakness and light-headedness.  All other systems reviewed and are negative.  Physical Exam Updated Vital Signs BP (!) 152/89    Pulse 83    Temp 98.7 F (37.1 C)    Resp 11    Ht 5\' 4"  (1.626 m)    Wt 81.5 kg    SpO2 98%    BMI 30.84 kg/m  Physical Exam Vitals and nursing note reviewed.  Constitutional:      General: She is not in acute distress.    Appearance: Normal appearance. She is not ill-appearing.  HENT:     Head: Normocephalic and atraumatic.     Nose: Nose normal.  Eyes:     General: No scleral icterus.    Extraocular Movements: Extraocular movements intact.     Conjunctiva/sclera: Conjunctivae normal.  Cardiovascular:     Rate and Rhythm: Normal rate and regular rhythm.     Pulses: Normal pulses.     Heart sounds: Normal heart sounds.  Pulmonary:     Effort: Pulmonary effort is normal. No respiratory distress.     Breath sounds: Normal breath sounds. No wheezing or rales.  Abdominal:     General: There is no distension.     Tenderness: There is no abdominal tenderness.  Musculoskeletal:        General: Normal range of motion.     Cervical back: Normal range of motion.     Right lower leg: No edema.     Left lower leg: No edema.  Skin:    General: Skin is warm and dry.  Neurological:     General: No focal deficit present.     Mental Status: She is alert. Mental status is at baseline.    ED Results / Procedures / Treatments   Labs (all labs ordered are listed, but only abnormal results are displayed) Labs Reviewed  CBC - Abnormal; Notable for the following components:      Result Value   RBC 3.41 (*)    Hemoglobin 10.5 (*)    HCT 32.0 (*)    RDW 15.9 (*)    All other components within normal limits  RESP PANEL BY RT-PCR (FLU A&B, COVID) ARPGX2  BASIC METABOLIC PANEL  TROPONIN I (HIGH SENSITIVITY)  TROPONIN I (HIGH SENSITIVITY)    EKG EKG Interpretation  Date/Time:  Tuesday February 02 2022 16:17:22 EST Ventricular Rate:  83 PR  Interval:  132 QRS Duration: 74 QT Interval:  384 QTC Calculation: 451 R Axis:   28 Text Interpretation: Normal sinus rhythm Nonspecific T wave abnormality Abnormal ECG When compared with ECG of 12-Jul-2021 22:39, No significant change since last tracing Confirmed by Calvert Cantor [phone]) on 02/02/2022 6:35:33 PM  Radiology DG Chest 2 View  Result Date: 02/02/2022 CLINICAL DATA:  Chest pain EXAM: CHEST - 2 VIEW COMPARISON:  10/08/2020 FINDINGS: The heart size and mediastinal contours are within normal limits. Both lungs are clear. The visualized skeletal structures are unremarkable. Tip of right IJ chest port is seen proximally at the  junction of superior vena cava and right atrium. IMPRESSION: No active cardiopulmonary disease. Electronically Signed   By: Elmer Picker M.D.   On: 02/02/2022 16:46    Procedures Procedures    Medications Ordered in ED Medications  morphine (PF) 4 MG/ML injection 4 mg (4 mg Intravenous Given 02/02/22 1903)  ondansetron (ZOFRAN) injection 4 mg (4 mg Intravenous Given 02/02/22 1903)  sodium chloride 0.9 % bolus 1,000 mL (1,000 mLs Intravenous New Bag/Given 02/02/22 1902)    ED Course/ Medical Decision Making/ A&P Clinical Course as of 02/02/22 2131  Tue Feb 02, 2022  2012 Patient reevaluated at bedside.  Reports resolution of nausea.  Will provide p.o. intake and assess response. [AA]    Clinical Course User Index [AA] Evlyn Courier, PA-C                           Medical Decision Making Amount and/or Complexity of Data Reviewed Labs: ordered. Radiology: ordered.  Risk Prescription drug management.   Medical Decision Making / ED Course   This patient presents to the ED for concern of nausea, vomiting, chest pain, this involves an extensive number of treatment options, and is a complaint that carries with it a high risk of complications and morbidity.  The differential diagnosis includes ACS, pneumonia, pneumothorax, PE, viral  gastroenteritis  MDM: 56 year old female presents today for evaluation of chest pain, nausea, and vomiting of 3 the day duration.  Patient reached out to her oncologist who recommended she come into the emergency room to be evaluated.  Patient does have history of pancreatic cancer and last received treatment 2-3 weeks ago.  Without tachypnea, hypoxia, pleuritic chest pain.  Without significant recent travel other than a trip to [facility] where she took frequent stops and ambulated.  Low concern for PE.  Abdomen benign and nondistended.  Patient afebrile today and afebrile in the emergency room.  Will evaluate with cardiac labs and provide pain medication, and Zofran.  Will provide IV hydration. Initial work-up with CBC without leukocytosis, mild anemia at 10.5 however that is improved from her baseline.  BMP unremarkable.  Respiratory panel negative.  Initial troponin negative.  On reevaluation patient states that her chest pain is longstanding and not new.  With her work-up above including negative chest x-ray, and EKG without acute ischemic changes.  Doubt ACS.  Reports resolution of nausea.  She is able to tolerate p.o. intake without difficulty. Patient is appropriate for discharge.  Discharged in stable condition.  Patient does not have PCP.  Will provide with [facility] to establish with.  Patient will follow-up with her oncologist.    Additional history obtained: -Additional history obtained from review of oncology visit from January 07, 2022.  Confirms patient has stable disease.  Along with a phone visit from today encouraging patient to come to the emergency room for temperature of 100.3, and vomiting. -External records from outside source obtained and reviewed including: Chart review including previous notes, labs, imaging, consultation notes   Lab Tests: -I ordered, reviewed, and interpreted labs.   The pertinent results include:   Labs Reviewed  CBC - Abnormal;  Notable for the following components:      Result Value   RBC 3.41 (*)    Hemoglobin 10.5 (*)    HCT 32.0 (*)    RDW 15.9 (*)    All other components within normal limits  RESP PANEL BY RT-PCR (FLU A&B, COVID)  ARPGX2  BASIC METABOLIC PANEL  TROPONIN I (HIGH SENSITIVITY)  TROPONIN I (HIGH SENSITIVITY)      EKG  EKG Interpretation  Date/Time:  Tuesday February 02 2022 16:17:22 EST Ventricular Rate:  83 PR Interval:  132 QRS Duration: 74 QT Interval:  384 QTC Calculation: 451 R Axis:   28 Text Interpretation: Normal sinus rhythm Nonspecific T wave abnormality Abnormal ECG When compared with ECG of 12-Jul-2021 22:39, No significant change since last tracing Confirmed by Calvert Cantor [phone]) on 02/02/2022 6:35:33 PM         Imaging Studies ordered: I ordered imaging studies including chest x-ray I independently visualized and interpreted imaging. I agree with the radiologist interpretation   Medicines ordered and prescription drug management: Meds ordered this encounter  Medications   morphine (PF) 4 MG/ML injection 4 mg   ondansetron (ZOFRAN) injection 4 mg   sodium chloride 0.9 % bolus 1,000 mL    -I have reviewed the patients home medicines and have made adjustments as needed  Critical interventions IV hydration, pain medication, Zofran.   Cardiac Monitoring: The patient was maintained on a cardiac monitor.  I personally viewed and interpreted the cardiac monitored which showed an underlying rhythm of: Normal sinus rhythm  Social Determinants of Health:  Factors impacting patients care include: She does not have primary care provider.  Provided resources to establish with.   Reevaluation: After the interventions noted above, I reevaluated the patient and found that they have :resolved  Co morbidities that complicate the patient evaluation  Past Medical History:  Diagnosis Date   Anemia    Anxiety    ARF (acute renal failure) ([place]) 06/18/2021    Arthritis    Asthma    Cervical ca ([place])    H/O: hysterectomy    High cholesterol    Hypertension    Hypothyroidism    Insomnia    Medical history non-contributory    Migraine    Morbid obesity ([place])    Pancreatic cancer ([place]) 02/2021   Shortness of breath    Stroke [facility])    Thyroid disease       Dispostion: Patient had improvement in symptoms.  Patient is appropriate for discharge.  Patient remained afebrile in the emergency room.  States her chest pain is chronic.  Without nausea and tolerating p.o. intake.  Patient will follow-up with oncology.   Final Clinical Impression(s) / ED Diagnoses Final diagnoses:  Nausea and vomiting, unspecified vomiting type  Chronic chest pain    Rx / DC Orders ED Discharge Orders          Ordered    ondansetron (ZOFRAN-ODT) 4 MG disintegrating tablet  Every 8 hours PRN        02/02/22 2137              Evlyn Courier, PA-C 02/02/22 2138    Truddie Hidden, MD 02/02/22 [phone]

## 2022-02-04 ENCOUNTER — Encounter: Payer: Self-pay | Admitting: *Deleted

## 2022-02-04 ENCOUNTER — Inpatient Hospital Stay (HOSPITAL_BASED_OUTPATIENT_CLINIC_OR_DEPARTMENT_OTHER): Payer: Medicaid Other | Admitting: Oncology

## 2022-02-04 ENCOUNTER — Other Ambulatory Visit: Payer: Self-pay

## 2022-02-04 ENCOUNTER — Inpatient Hospital Stay: Payer: Medicaid Other

## 2022-02-04 ENCOUNTER — Inpatient Hospital Stay: Payer: Medicaid Other | Attending: Nurse Practitioner

## 2022-02-04 VITALS — BP 154/90 | HR 89 | Temp 97.8°F | Resp 19 | Ht 64.0 in | Wt 182.2 lb

## 2022-02-04 DIAGNOSIS — C250 Malignant neoplasm of head of pancreas: Secondary | ICD-10-CM

## 2022-02-04 DIAGNOSIS — Z5111 Encounter for antineoplastic chemotherapy: Secondary | ICD-10-CM | POA: Diagnosis not present

## 2022-02-04 DIAGNOSIS — C787 Secondary malignant neoplasm of liver and intrahepatic bile duct: Secondary | ICD-10-CM | POA: Insufficient documentation

## 2022-02-04 DIAGNOSIS — C259 Malignant neoplasm of pancreas, unspecified: Secondary | ICD-10-CM | POA: Diagnosis not present

## 2022-02-04 DIAGNOSIS — Z95828 Presence of other vascular implants and grafts: Secondary | ICD-10-CM

## 2022-02-04 LAB — CBC WITH DIFFERENTIAL (CANCER CENTER ONLY)
Abs Immature Granulocytes: 0.03 10*3/uL (ref 0.00–0.07)
Basophils Absolute: 0 10*3/uL (ref 0.0–0.1)
Basophils Relative: 0 %
Eosinophils Absolute: 1 10*3/uL — ABNORMAL HIGH (ref 0.0–0.5)
Eosinophils Relative: 14 %
HCT: 28.8 % — ABNORMAL LOW (ref 36.0–46.0)
Hemoglobin: 9.4 g/dL — ABNORMAL LOW (ref 12.0–15.0)
Immature Granulocytes: 0 %
Lymphocytes Relative: 15 %
Lymphs Abs: 1.1 10*3/uL (ref 0.7–4.0)
MCH: 30.9 pg (ref 26.0–34.0)
MCHC: 32.6 g/dL (ref 30.0–36.0)
MCV: 94.7 fL (ref 80.0–100.0)
Monocytes Absolute: 0.6 10*3/uL (ref 0.1–1.0)
Monocytes Relative: 8 %
Neutro Abs: 4.7 10*3/uL (ref 1.7–7.7)
Neutrophils Relative %: 63 %
Platelet Count: 276 10*3/uL (ref 150–400)
RBC: 3.04 MIL/uL — ABNORMAL LOW (ref 3.87–5.11)
RDW: 15.7 % — ABNORMAL HIGH (ref 11.5–15.5)
WBC Count: 7.5 10*3/uL (ref 4.0–10.5)
nRBC: 0 % (ref 0.0–0.2)

## 2022-02-04 LAB — CMP (CANCER CENTER ONLY)
ALT: 14 U/L (ref 0–44)
AST: 22 U/L (ref 15–41)
Albumin: 3.4 g/dL — ABNORMAL LOW (ref 3.5–5.0)
Alkaline Phosphatase: 116 U/L (ref 38–126)
Anion gap: 7 (ref 5–15)
BUN: 23 mg/dL — ABNORMAL HIGH (ref 6–20)
CO2: 28 mmol/L (ref 22–32)
Calcium: 8.9 mg/dL (ref 8.9–10.3)
Chloride: 105 mmol/L (ref 98–111)
Creatinine: 1.03 mg/dL — ABNORMAL HIGH (ref 0.44–1.00)
GFR, Estimated: >60 mL/min (ref >60)
Glucose, Bld: 116 mg/dL — ABNORMAL HIGH (ref 70–99)
Potassium: 3.5 mmol/L (ref 3.5–5.1)
Sodium: 140 mmol/L (ref 135–145)
Total Bilirubin: 0.4 mg/dL (ref 0.3–1.2)
Total Protein: 6.1 g/dL — ABNORMAL LOW (ref 6.5–8.1)

## 2022-02-04 MED ORDER — GABAPENTIN 300 MG PO CAPS: ORAL_CAPSULE | ORAL | 0 refills | Status: DC

## 2022-02-04 MED ORDER — SODIUM CHLORIDE 0.9 % IV SOLN
1000.0000 mg/m2 | Freq: Once | INTRAVENOUS | Status: AC
  Administered 2022-02-04: 1900 mg via INTRAVENOUS
  Filled 2022-02-04: qty 49.97

## 2022-02-04 MED ORDER — SODIUM CHLORIDE 0.9% FLUSH
10.0000 mL | INTRAVENOUS | Status: DC | PRN
  Administered 2022-02-04: 10 mL

## 2022-02-04 MED ORDER — HEPARIN SOD (PORK) LOCK FLUSH 100 UNIT/ML IV SOLN
500.0000 [IU] | Freq: Once | INTRAVENOUS | Status: AC | PRN
  Administered 2022-02-04: 500 [IU]

## 2022-02-04 MED ORDER — SODIUM CHLORIDE 0.9 % IV SOLN: Freq: Once | INTRAVENOUS | Status: AC

## 2022-02-04 MED ORDER — PACLITAXEL PROTEIN-BOUND CHEMO INJECTION 100 MG
100.0000 mg/m2 | Freq: Once | INTRAVENOUS | Status: AC
  Administered 2022-02-04: 200 mg via INTRAVENOUS
  Filled 2022-02-04: qty 40

## 2022-02-04 MED ORDER — FLUCONAZOLE 100 MG PO TABS
100.0000 mg | ORAL_TABLET | Freq: Every day | ORAL | 0 refills | Status: DC
Start: 2022-02-04 — End: 2022-02-22

## 2022-02-04 MED ORDER — PROCHLORPERAZINE MALEATE 10 MG PO TABS
10.0000 mg | ORAL_TABLET | Freq: Once | ORAL | Status: AC
  Administered 2022-02-04: 10 mg via ORAL
  Filled 2022-02-04: qty 1

## 2022-02-04 NOTE — Progress Notes (Signed)
[facility] OFFICE PROGRESS NOTE   Diagnosis: Pancreas cancer  INTERVAL HISTORY:   Wendy Hoffman was last treated with gemcitabine/Abraxane 01/07/2022.  She missed the last scheduled cycle of chemotherapy due to a vacation.  She reports her pain is controlled with the current narcotic regimen.  She takes Roxanol approximately twice daily.  No change in numbness at the feet.  She has occasional vomiting, but this has improved. She was seen in the emergency room 02/02/2022 with chest pain/back pain and nausea/vomiting.  The symptoms have resolved.  She was treated with morphine, Zofran, and IV fluids. Objective:  Vital signs in last 24 hours:  Blood pressure (!) 154/90, pulse 89, temperature 97.8 F (36.6 C), temperature source Oral, resp. rate 19, height '5\' 4"'  (1.626 m), weight 182 lb 3.2 oz (82.6 kg), SpO2 100 %.    HEENT: No thrush Resp: Lungs clear bilaterally Cardio: Regular rate and rhythm GI: Mid and right upper abdomen, no mass, no hepatomegaly Vascular: No leg edema    Portacath/PICC-without erythema  Lab Results:  Lab Results  Component Value Date   WBC 7.5 02/04/2022   HGB 9.4 (L) 02/04/2022   HCT 28.8 (L) 02/04/2022   MCV 94.7 02/04/2022   PLT 276 02/04/2022   NEUTROABS 4.7 02/04/2022    CMP  Lab Results  Component Value Date   NA 140 02/04/2022   K PENDING 02/04/2022   CL 105 02/04/2022   CO2 28 02/04/2022   GLUCOSE PENDING 02/04/2022   BUN PENDING 02/04/2022   CREATININE 1.03 (H) 02/04/2022   CALCIUM 8.9 02/04/2022   PROT 6.1 (L) 02/04/2022   ALBUMIN 3.4 (L) 02/04/2022   AST 22 02/04/2022   ALT 14 02/04/2022   ALKPHOS 116 02/04/2022   BILITOT 0.4 02/04/2022   GFRNONAA >60 02/04/2022   GFRAA >60 07/21/2020    Lab Results  Component Value Date   CEA1 68.8 (H) 02/23/2021   CEA <0.5 10/15/2008   CAN199 15,311 (H) 01/07/2022    Lab Results  Component Value Date   INR 1.2 06/27/2021   LABPROT 15.2 06/27/2021    Imaging:  DG  Chest 2 View  Result Date: 02/02/2022 CLINICAL DATA:  Chest pain EXAM: CHEST - 2 VIEW COMPARISON:  10/08/2020 FINDINGS: The heart size and mediastinal contours are within normal limits. Both lungs are clear. The visualized skeletal structures are unremarkable. Tip of right IJ chest port is seen proximally at the junction of superior vena cava and right atrium. IMPRESSION: No active cardiopulmonary disease. Electronically Signed   By: Elmer Picker M.D.   On: 02/02/2022 16:46    Medications: I have reviewed the patient's current medications.   Assessment/Plan: Pancreas cancer -CT abdomen/pelvis without contrast 02/16/2021-multiple large hypoechoic masses in the patient's liver consistent metastatic disease, ill-defined masslike area in the pancreatic head measuring approximate 4.7 cm, possible filling defect in the right ventricle. -EGD 02/19/2021-large infiltrative mass with bleeding in the second portion of the duodenum, appears to be arising near the ampulla, partially obstructive with friable mucosa.  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 -Flexible sigmoidoscopy 02/19/2021-diverticulosis in the sigmoid colon, nonbleeding external and internal hemorrhoids -Cardiac CT 02/20/2021-mass in the mid RV attached to the interventricular septum measuring 16 mm x 6 mm and concerning for metastatic tumor -MRI abdomen with/without contrast 02/21/2021-mass in the posterior pancreatic head measuring 4.3 x 3.8 cm with obstruction of the central pancreatic duct and diffuse dilatation up to 6 mm consistent with pancreatic adenocarcinoma, liver lesions the largest mass  of the central left lobe measuring 9.9 x 9.2 cm consistent with hepatic metastatic disease,  hepatomegaly. -Ultrasound-guided biopsy of a left liver lesion 02/24/2021-adenocarcinoma, morphologically similar to the duodenal biopsy; microsatellite stable, tumor mutation burden 1 -Negative genetic testing -Palliative  radiation to the duodenal mass 02/25/2021-03/06/2021 -Cycle 1 FOLFOX 03/17/2021 -Cycle 2 FOLFOX 04/01/2021 -Cycle 3 FOLFIRINOX 04/14/2021 -Cycle 4 FOLFIRINOX 04/27/2021, Udenyca added -Cycle 5 FOLFIRINOX 05/12/2021, Udenyca -MRI abdomen 05/23/2021-decrease in size of pancreas mass and hepatic lesions, no progressive disease -Cycle 6 FOLFIRINOX 05/27/2021 -CT 06/17/2021-multiple liver metastases similar to her prior exam.  Pancreas mass not significantly changed. -CT 06/26/2021-stable pancreas mass, no significant change in size and number of known liver metastases. -CT abdomen/pelvis 07/12/2021-new gastrostomy tube.  Multiple liver masses slightly decreased in size from prior exam.  Pancreas head mass unchanged. -Cycle 1 gemcitabine/Abraxane 07/30/2021 -Cycle 2 gemcitabine/Abraxane 08/13/2021 -Cycle 3 gemcitabine 08/27/2021, Abraxane held due to significant bilateral toe pain, question neuropathy -Cycle 4 gemcitabine, Abraxane held due to neuropathy, 09/10/2021 -Cycle 5 gemcitabine, Abraxane held due to neuropathy, 09/24/2021 -Cycle 6 gemcitabine, Abraxane held due to neuropathy 10/16/2021 -CTs 10/29/2021-mild progression of multifocal liver metastases; mild increase in size of hypoenhancing mass within the head of the pancreas; new mild pleural nodularity along the posterior lower lobes -Cycle 7 gemcitabine/Abraxane 11/03/2021 -Cycle 8 gemcitabine/Abraxane 11/23/2021 -Cycle 9 gemcitabine/Abraxane 12/10/2021 -Cycle 10 Gemcitabine/Abraxane 12/24/2021 -CT abdomen/pelvis 01/04/2022-decrease size of pancreas head mass, no change in multifocal hepatic metastases -Cycle 11 gemcitabine/Abraxane 01/07/2022 -Cycle 12 gemcitabine/Abraxane 02/04/2022 2.  Iron deficiency anemia-improved -Feraheme 510 mg 02/20/2021 3.  Protein calorie malnutrition 4.  Possible right ventricular filling defect on noncontrast CT scan MRI cardiac morphology 02/21/2021-mass in the mid RV attached to the interventricular septum measures 16 mm x 6  mm.  Mass appears heterogeneous with area of enhancement on LGE imaging.  Mass is concerning for metastatic tumor.  Normal LV size and systolic function.  Normal RV size and systolic function. 5.  Hypertension 6.  History of CVA 7.  Hyperlipidemia 8.  Hypothyroidism 9.  Morbid obesity 10.  Asthma 11.  Migraines 12.  Pain secondary to #1 13.  Port-A-Cath placement 02/25/2021 14.  Hypokalemia 04/01/2021-started a potassium supplement 15.  Hospital admission 06/17/2021-intractable nausea and vomiting EGD 06/19/2021-diffuse gastritis, nonobstructing duodenal mass, biopsy of stomach-no malignancy or H. pylori, duodenal mass-adenocarcinoma 16.  Hospital admission 07/12/2021 with nausea/vomiting, abdominal pain, and hypokalemia 17.  Percutaneous gastrostomy tube placement 07/01/2021        Disposition: Wendy Hoffman appears well today.  Her overall performance status appears improved.  She will complete another treatment with gemcitabine/Abraxane today.  She will continue MS Contin and Roxanol for pain.  We refilled prescriptions for Diflucan and gabapentin today.  She will return for an office visit and chemotherapy in 2 weeks.  We will follow-up on the CA 19-9 from today.  Betsy Coder, MD  02/04/2022  11:20 AM

## 2022-02-04 NOTE — Patient Instructions (Signed)

## 2022-02-04 NOTE — Progress Notes (Signed)
Patient seen by Dr. Benay Spice today  Vitals are within treatment parameters.  Labs reviewed by Dr. Benay Spice CBC diff reviewed and within treatment parameters, CMP pending. OK to treat now based on current CMP results noted.  Per physician team, patient is ready for treatment and there are NO modifications to the treatment plan.

## 2022-02-04 NOTE — Patient Instructions (Signed)
[facility]  Discharge Instructions: Thank you for choosing [facility] to provide your oncology and hematology care.   If you have a lab appointment with the [facility], please go directly to the [facility] and check in at the registration area.   Wear comfortable clothing and clothing appropriate for easy access to any Portacath or PICC line.   We strive to give you quality time with your provider. You may need to reschedule your appointment if you arrive late (15 or more minutes).  Arriving late affects you and other patients whose appointments are after yours.  Also, if you miss three or more appointments without notifying the office, you may be dismissed from the clinic at the providers discretion.      For prescription refill requests, have your pharmacy contact our office and allow 72 hours for refills to be completed.    Today you received the following chemotherapy and/or immunotherapy agents Abraxane and Gemzar      To help prevent nausea and vomiting after your treatment, we encourage you to take your nausea medication as directed.  BELOW ARE SYMPTOMS THAT SHOULD BE REPORTED IMMEDIATELY: *FEVER GREATER THAN 100.4 F (38 C) OR HIGHER *CHILLS OR SWEATING *NAUSEA AND VOMITING THAT IS NOT CONTROLLED WITH YOUR NAUSEA MEDICATION *UNUSUAL SHORTNESS OF BREATH *UNUSUAL BRUISING OR BLEEDING *URINARY PROBLEMS (pain or burning when urinating, or frequent urination) *BOWEL PROBLEMS (unusual diarrhea, constipation, pain near the anus) TENDERNESS IN MOUTH AND THROAT WITH OR WITHOUT PRESENCE OF ULCERS (sore throat, sores in mouth, or a toothache) UNUSUAL RASH, SWELLING OR PAIN  UNUSUAL VAGINAL DISCHARGE OR ITCHING   Items with * indicate a potential emergency and should be followed up as soon as possible or go to the Emergency Department if any problems should occur.  Please show the CHEMOTHERAPY ALERT CARD or IMMUNOTHERAPY ALERT CARD at  check-in to the Emergency Department and triage nurse.  Should you have questions after your visit or need to cancel or reschedule your appointment, please contact [facility]  Dept: [phone]  and follow the prompts.  Office hours are 8:00 a.m. to 4:30 p.m. Monday - Friday. Please note that voicemails left after 4:00 p.m. may not be returned until the following business day.  We are closed weekends and major holidays. You have access to a nurse at all times for urgent questions. Please call the main number to the clinic Dept: [phone] and follow the prompts.   For any non-urgent questions, you may also contact your provider using MyChart. We now offer e-Visits for anyone 45 and older to request care online for non-urgent symptoms. For details visit [email or web address].   Also download the MyChart app! Go to the app store, search "MyChart", open the app, select [facility], and log in with your MyChart username and password.  Due to Covid, a mask is required upon entering the hospital/clinic. If you do not have a mask, one will be given to you upon arrival. For doctor visits, patients may have 1 support person aged 25 or older with them. For treatment visits, patients cannot have anyone with them due to current Covid guidelines and our immunocompromised population.

## 2022-02-05 LAB — CANCER ANTIGEN 19-9: CA 19-9: 13923 U/mL — ABNORMAL HIGH (ref 0–35)

## 2022-02-11 ENCOUNTER — Encounter: Payer: Self-pay | Admitting: Oncology

## 2022-02-17 ENCOUNTER — Other Ambulatory Visit: Payer: Self-pay | Admitting: Oncology

## 2022-02-18 ENCOUNTER — Other Ambulatory Visit: Payer: Self-pay

## 2022-02-18 ENCOUNTER — Inpatient Hospital Stay: Payer: Medicaid Other | Attending: Nurse Practitioner

## 2022-02-18 ENCOUNTER — Inpatient Hospital Stay: Payer: Medicaid Other

## 2022-02-18 ENCOUNTER — Encounter: Payer: Self-pay | Admitting: Nurse Practitioner

## 2022-02-18 ENCOUNTER — Encounter: Payer: Self-pay | Admitting: *Deleted

## 2022-02-18 ENCOUNTER — Inpatient Hospital Stay (HOSPITAL_BASED_OUTPATIENT_CLINIC_OR_DEPARTMENT_OTHER): Payer: Medicaid Other | Admitting: Nurse Practitioner

## 2022-02-18 VITALS — BP 150/92 | HR 68

## 2022-02-18 VITALS — BP 132/80 | HR 95 | Temp 97.8°F | Resp 20 | Ht 64.0 in | Wt 188.0 lb

## 2022-02-18 DIAGNOSIS — C250 Malignant neoplasm of head of pancreas: Secondary | ICD-10-CM

## 2022-02-18 DIAGNOSIS — C787 Secondary malignant neoplasm of liver and intrahepatic bile duct: Secondary | ICD-10-CM | POA: Insufficient documentation

## 2022-02-18 DIAGNOSIS — D509 Iron deficiency anemia, unspecified: Secondary | ICD-10-CM | POA: Diagnosis not present

## 2022-02-18 DIAGNOSIS — C259 Malignant neoplasm of pancreas, unspecified: Secondary | ICD-10-CM

## 2022-02-18 DIAGNOSIS — Z5111 Encounter for antineoplastic chemotherapy: Secondary | ICD-10-CM | POA: Diagnosis present

## 2022-02-18 LAB — CMP (CANCER CENTER ONLY)
ALT: 33 U/L (ref 0–44)
AST: 37 U/L (ref 15–41)
Albumin: 3.6 g/dL (ref 3.5–5.0)
Alkaline Phosphatase: 140 U/L — ABNORMAL HIGH (ref 38–126)
Anion gap: 8 (ref 5–15)
BUN: 20 mg/dL (ref 6–20)
CO2: 30 mmol/L (ref 22–32)
Calcium: 9.4 mg/dL (ref 8.9–10.3)
Chloride: 104 mmol/L (ref 98–111)
Creatinine: 0.89 mg/dL (ref 0.44–1.00)
GFR, Estimated: >60 mL/min (ref >60)
Glucose, Bld: 103 mg/dL — ABNORMAL HIGH (ref 70–99)
Potassium: 4.1 mmol/L (ref 3.5–5.1)
Sodium: 142 mmol/L (ref 135–145)
Total Bilirubin: 0.3 mg/dL (ref 0.3–1.2)
Total Protein: 6.5 g/dL (ref 6.5–8.1)

## 2022-02-18 LAB — CBC WITH DIFFERENTIAL (CANCER CENTER ONLY)
Abs Immature Granulocytes: 0.03 10*3/uL (ref 0.00–0.07)
Basophils Absolute: 0 10*3/uL (ref 0.0–0.1)
Basophils Relative: 0 %
Eosinophils Absolute: 0.4 10*3/uL (ref 0.0–0.5)
Eosinophils Relative: 9 %
HCT: 28.2 % — ABNORMAL LOW (ref 36.0–46.0)
Hemoglobin: 9 g/dL — ABNORMAL LOW (ref 12.0–15.0)
Immature Granulocytes: 1 %
Lymphocytes Relative: 19 %
Lymphs Abs: 0.9 10*3/uL (ref 0.7–4.0)
MCH: 30.9 pg (ref 26.0–34.0)
MCHC: 31.9 g/dL (ref 30.0–36.0)
MCV: 96.9 fL (ref 80.0–100.0)
Monocytes Absolute: 0.5 10*3/uL (ref 0.1–1.0)
Monocytes Relative: 10 %
Neutro Abs: 2.8 10*3/uL (ref 1.7–7.7)
Neutrophils Relative %: 61 %
Platelet Count: 279 10*3/uL (ref 150–400)
RBC: 2.91 MIL/uL — ABNORMAL LOW (ref 3.87–5.11)
RDW: 16.3 % — ABNORMAL HIGH (ref 11.5–15.5)
WBC Count: 4.7 10*3/uL (ref 4.0–10.5)
nRBC: 0 % (ref 0.0–0.2)

## 2022-02-18 MED ORDER — SODIUM CHLORIDE 0.9 % IV SOLN
1000.0000 mg/m2 | Freq: Once | INTRAVENOUS | Status: AC
  Administered 2022-02-18: 14:00:00 1900 mg via INTRAVENOUS
  Filled 2022-02-18: qty 49.97

## 2022-02-18 MED ORDER — PACLITAXEL PROTEIN-BOUND CHEMO INJECTION 100 MG
100.0000 mg/m2 | Freq: Once | INTRAVENOUS | Status: AC
  Administered 2022-02-18: 13:00:00 200 mg via INTRAVENOUS
  Filled 2022-02-18: qty 40

## 2022-02-18 MED ORDER — HEPARIN SOD (PORK) LOCK FLUSH 100 UNIT/ML IV SOLN
500.0000 [IU] | Freq: Once | INTRAVENOUS | Status: AC | PRN
  Administered 2022-02-18: 14:00:00 500 [IU]

## 2022-02-18 MED ORDER — PROCHLORPERAZINE MALEATE 10 MG PO TABS
10.0000 mg | ORAL_TABLET | Freq: Once | ORAL | Status: AC
  Administered 2022-02-18: 12:00:00 10 mg via ORAL
  Filled 2022-02-18: qty 1

## 2022-02-18 MED ORDER — SODIUM CHLORIDE 0.9 % IV SOLN: Freq: Once | INTRAVENOUS | Status: AC

## 2022-02-18 MED ORDER — SODIUM CHLORIDE 0.9% FLUSH
10.0000 mL | INTRAVENOUS | Status: DC | PRN
  Administered 2022-02-18: 14:00:00 10 mL

## 2022-02-18 MED ORDER — ZOLPIDEM TARTRATE 10 MG PO TABS: ORAL_TABLET | ORAL | 0 refills | Status: DC

## 2022-02-18 NOTE — Patient Instructions (Signed)
[facility]   Discharge Instructions: Thank you for choosing [facility] to provide your oncology and hematology care.   If you have a lab appointment with the [facility], please go directly to the [facility] and check in at the registration area.   Wear comfortable clothing and clothing appropriate for easy access to any Portacath or PICC line.   We strive to give you quality time with your provider. You may need to reschedule your appointment if you arrive late (15 or more minutes).  Arriving late affects you and other patients whose appointments are after yours.  Also, if you miss three or more appointments without notifying the office, you may be dismissed from the clinic at the providers discretion.      For prescription refill requests, have your pharmacy contact our office and allow 72 hours for refills to be completed.    Today you received the following chemotherapy and/or immunotherapy agents Abraxane, Gemzar      To help prevent nausea and vomiting after your treatment, we encourage you to take your nausea medication as directed.  BELOW ARE SYMPTOMS THAT SHOULD BE REPORTED IMMEDIATELY: *FEVER GREATER THAN 100.4 F (38 C) OR HIGHER *CHILLS OR SWEATING *NAUSEA AND VOMITING THAT IS NOT CONTROLLED WITH YOUR NAUSEA MEDICATION *UNUSUAL SHORTNESS OF BREATH *UNUSUAL BRUISING OR BLEEDING *URINARY PROBLEMS (pain or burning when urinating, or frequent urination) *BOWEL PROBLEMS (unusual diarrhea, constipation, pain near the anus) TENDERNESS IN MOUTH AND THROAT WITH OR WITHOUT PRESENCE OF ULCERS (sore throat, sores in mouth, or a toothache) UNUSUAL RASH, SWELLING OR PAIN  UNUSUAL VAGINAL DISCHARGE OR ITCHING   Items with * indicate a potential emergency and should be followed up as soon as possible or go to the Emergency Department if any problems should occur.  Please show the CHEMOTHERAPY ALERT CARD or IMMUNOTHERAPY ALERT CARD at  check-in to the Emergency Department and triage nurse.  Should you have questions after your visit or need to cancel or reschedule your appointment, please contact [facility]  Dept: [phone]  and follow the prompts.  Office hours are 8:00 a.m. to 4:30 p.m. Monday - Friday. Please note that voicemails left after 4:00 p.m. may not be returned until the following business day.  We are closed weekends and major holidays. You have access to a nurse at all times for urgent questions. Please call the main number to the clinic Dept: [phone] and follow the prompts.   For any non-urgent questions, you may also contact your provider using MyChart. We now offer e-Visits for anyone 34 and older to request care online for non-urgent symptoms. For details visit [email or web address].   Also download the MyChart app! Go to the app store, search "MyChart", open the app, select [facility], and log in with your MyChart username and password.  Due to Covid, a mask is required upon entering the hospital/clinic. If you do not have a mask, one will be given to you upon arrival. For doctor visits, patients may have 1 support person aged 8 or older with them. For treatment visits, patients cannot have anyone with them due to current Covid guidelines and our immunocompromised population.   Nanoparticle Albumin-Bound Paclitaxel injection What is this medication? NANOPARTICLE ALBUMIN-BOUND PACLITAXEL (Na no PAHR ti kuhl  al BYOO muhn-bound  PAK li TAX el) is a chemotherapy drug. It targets fast dividing cells, like cancer cells, and causes these cells to die. This medicine is used  to treat advanced breast cancer, lung cancer, and pancreatic cancer. This medicine may be used for other purposes; ask your health care provider or pharmacist if you have questions. COMMON BRAND NAME(S): Abraxane What should I tell my care team before I take this medication? They need to know if you have any  of these conditions: kidney disease liver disease low blood counts, like low white cell, platelet, or red cell counts lung or breathing disease, like asthma tingling of the fingers or toes, or other nerve disorder an unusual or allergic reaction to paclitaxel, albumin, other chemotherapy, other medicines, foods, dyes, or preservatives pregnant or trying to get pregnant breast-feeding How should I use this medication? This drug is given as an infusion into a vein. It is administered in a hospital or clinic by a specially trained health care professional. Talk to your pediatrician regarding the use of this medicine in children. Special care may be needed. Overdosage: If you think you have taken too much of this medicine contact a poison control center or emergency room at once. NOTE: This medicine is only for you. Do not share this medicine with others. What if I miss a dose? It is important not to miss your dose. Call your doctor or health care professional if you are unable to keep an appointment. What may interact with this medication? This medicine may interact with the following medications: antiviral medicines for hepatitis, HIV or AIDS certain antibiotics like erythromycin and clarithromycin certain medicines for fungal infections like ketoconazole and itraconazole certain medicines for seizures like carbamazepine, phenobarbital, phenytoin gemfibrozil nefazodone rifampin St. John's wort This list may not describe all possible interactions. Give your health care provider a list of all the medicines, herbs, non-prescription drugs, or dietary supplements you use. Also tell them if you smoke, drink alcohol, or use illegal drugs. Some items may interact with your medicine. What should I watch for while using this medication? Your condition will be monitored carefully while you are receiving this medicine. You will need important blood work done while you are taking this medicine. This  medicine can cause serious allergic reactions. If you experience allergic reactions like skin rash, itching or hives, swelling of the face, lips, or tongue, tell your doctor or health care professional right away. In some cases, you may be given additional medicines to help with side effects. Follow all directions for their use. This drug may make you feel generally unwell. This is not uncommon, as chemotherapy can affect healthy cells as well as cancer cells. Report any side effects. Continue your course of treatment even though you feel ill unless your doctor tells you to stop. Call your doctor or health care professional for advice if you get a fever, chills or sore throat, or other symptoms of a cold or flu. Do not treat yourself. This drug decreases your body's ability to fight infections. Try to avoid being around people who are sick. This medicine may increase your risk to bruise or bleed. Call your doctor or health care professional if you notice any unusual bleeding. Be careful brushing and flossing your teeth or using a toothpick because you may get an infection or bleed more easily. If you have any dental work done, tell your dentist you are receiving this medicine. Avoid taking products that contain aspirin, acetaminophen, ibuprofen, naproxen, or ketoprofen unless instructed by your doctor. These medicines may hide a fever. Do not become pregnant while taking this medicine or for 6 months after stopping it. Women should  inform their doctor if they wish to become pregnant or think they might be pregnant. Men should not father a child while taking this medicine or for 3 months after stopping it. There is a potential for serious side effects to an unborn child. Talk to your health care professional or pharmacist for more information. Do not breast-feed an infant while taking this medicine or for 2 weeks after stopping it. This medicine may interfere with the ability to get pregnant or to father a  child. You should talk to your doctor or health care professional if you are concerned about your fertility. What side effects may I notice from receiving this medication? Side effects that you should report to your doctor or health care professional as soon as possible: allergic reactions like skin rash, itching or hives, swelling of the face, lips, or tongue breathing problems changes in vision fast, irregular heartbeat low blood pressure mouth sores pain, tingling, numbness in the hands or feet signs of decreased platelets or bleeding - bruising, pinpoint red spots on the skin, black, tarry stools, blood in the urine signs of decreased red blood cells - unusually weak or tired, feeling faint or lightheaded, falls signs of infection - fever or chills, cough, sore throat, pain or difficulty passing urine signs and symptoms of liver injury like dark yellow or brown urine; general ill feeling or flu-like symptoms; light-colored stools; loss of appetite; nausea; right upper belly pain; unusually weak or tired; yellowing of the eyes or skin swelling of the ankles, feet, hands unusually slow heartbeat Side effects that usually do not require medical attention (report to your doctor or health care professional if they continue or are bothersome): diarrhea hair loss loss of appetite nausea, vomiting tiredness This list may not describe all possible side effects. Call your doctor for medical advice about side effects. You may report side effects to FDA at 1-800-FDA-1088. Where should I keep my medication? This drug is given in a hospital or clinic and will not be stored at home. NOTE: This sheet is a summary. It may not cover all possible information. If you have questions about this medicine, talk to your doctor, pharmacist, or health care provider.  2022 Elsevier/Gold Standard (2017-08-02 00:00:00)  Gemcitabine injection What is this medication? GEMCITABINE (jem SYE ta been) is a  chemotherapy drug. This medicine is used to treat many types of cancer like breast cancer, lung cancer, pancreatic cancer, and ovarian cancer. This medicine may be used for other purposes; ask your health care provider or pharmacist if you have questions. COMMON BRAND NAME(S): Gemzar, Infugem What should I tell my care team before I take this medication? They need to know if you have any of these conditions: blood disorders infection kidney disease liver disease lung or breathing disease, like asthma recent or ongoing radiation therapy an unusual or allergic reaction to gemcitabine, other chemotherapy, other medicines, foods, dyes, or preservatives pregnant or trying to get pregnant breast-feeding How should I use this medication? This drug is given as an infusion into a vein. It is administered in a hospital or clinic by a specially trained health care professional. Talk to your pediatrician regarding the use of this medicine in children. Special care may be needed. Overdosage: If you think you have taken too much of this medicine contact a poison control center or emergency room at once. NOTE: This medicine is only for you. Do not share this medicine with others. What if I miss a dose? It is important not  to miss your dose. Call your doctor or health care professional if you are unable to keep an appointment. What may interact with this medication? medicines to increase blood counts like filgrastim, pegfilgrastim, sargramostim some other chemotherapy drugs like cisplatin vaccines Talk to your doctor or health care professional before taking any of these medicines: acetaminophen aspirin ibuprofen ketoprofen naproxen This list may not describe all possible interactions. Give your health care provider a list of all the medicines, herbs, non-prescription drugs, or dietary supplements you use. Also tell them if you smoke, drink alcohol, or use illegal drugs. Some items may interact with  your medicine. What should I watch for while using this medication? Visit your doctor for checks on your progress. This drug may make you feel generally unwell. This is not uncommon, as chemotherapy can affect healthy cells as well as cancer cells. Report any side effects. Continue your course of treatment even though you feel ill unless your doctor tells you to stop. In some cases, you may be given additional medicines to help with side effects. Follow all directions for their use. Call your doctor or health care professional for advice if you get a fever, chills or sore throat, or other symptoms of a cold or flu. Do not treat yourself. This drug decreases your body's ability to fight infections. Try to avoid being around people who are sick. This medicine may increase your risk to bruise or bleed. Call your doctor or health care professional if you notice any unusual bleeding. Be careful brushing and flossing your teeth or using a toothpick because you may get an infection or bleed more easily. If you have any dental work done, tell your dentist you are receiving this medicine. Avoid taking products that contain aspirin, acetaminophen, ibuprofen, naproxen, or ketoprofen unless instructed by your doctor. These medicines may hide a fever. Do not become pregnant while taking this medicine or for 6 months after stopping it. Women should inform their doctor if they wish to become pregnant or think they might be pregnant. Men should not father a child while taking this medicine and for 3 months after stopping it. There is a potential for serious side effects to an unborn child. Talk to your health care professional or pharmacist for more information. Do not breast-feed an infant while taking this medicine or for at least 1 week after stopping it. Men should inform their doctors if they wish to father a child. This medicine may lower sperm counts. Talk with your doctor or health care professional if you are  concerned about your fertility. What side effects may I notice from receiving this medication? Side effects that you should report to your doctor or health care professional as soon as possible: allergic reactions like skin rash, itching or hives, swelling of the face, lips, or tongue breathing problems pain, redness, or irritation at site where injected signs and symptoms of a dangerous change in heartbeat or heart rhythm like chest pain; dizziness; fast or irregular heartbeat; palpitations; feeling faint or lightheaded, falls; breathing problems signs of decreased platelets or bleeding - bruising, pinpoint red spots on the skin, black, tarry stools, blood in the urine signs of decreased red blood cells - unusually weak or tired, feeling faint or lightheaded, falls signs of infection - fever or chills, cough, sore throat, pain or difficulty passing urine signs and symptoms of kidney injury like trouble passing urine or change in the amount of urine signs and symptoms of liver injury like dark yellow or  brown urine; general ill feeling or flu-like symptoms; light-colored stools; loss of appetite; nausea; right upper belly pain; unusually weak or tired; yellowing of the eyes or skin swelling of ankles, feet, hands Side effects that usually do not require medical attention (report to your doctor or health care professional if they continue or are bothersome): constipation diarrhea hair loss loss of appetite nausea rash vomiting This list may not describe all possible side effects. Call your doctor for medical advice about side effects. You may report side effects to FDA at 1-800-FDA-1088. Where should I keep my medication? This drug is given in a hospital or clinic and will not be stored at home. NOTE: This sheet is a summary. It may not cover all possible information. If you have questions about this medicine, talk to your doctor, pharmacist, or health care provider.  2022 Elsevier/Gold  Standard (2018-02-22 00:00:00)

## 2022-02-18 NOTE — Progress Notes (Signed)
Patient presents for treatment. RN assessment completed along with the following: ? ?Labs/vitals reviewed - Yes, and within treatment parameters.   ?Weight within 10% of previous measurement - Yes ?Informed consent completed and reflects current therapy/intent -  8 /18/22          ?Provider progress note reviewed - Yes, today's provider note was reviewed. ?Treatment/Antibody/Supportive plan reviewed - Yes, and there are no adjustments needed for today's treatment. ?S&H and other orders reviewed - Yes, and there are no additional orders identified. ?Previous treatment date reviewed - Yes, and the appropriate amount of time has elapsed between treatments. ?Clinic Hand Off Received from - Cristy Friedlander, RN ? ?Patient to proceed with treatment.  ? ?

## 2022-02-18 NOTE — Progress Notes (Signed)
Patient seen by Lisa Thomas NP today  Vitals are within treatment parameters.  Labs reviewed by Lisa Thomas NP and are within treatment parameters.  Per physician team, patient is ready for treatment and there are NO modifications to the treatment plan.     

## 2022-02-18 NOTE — Progress Notes (Signed)
?[place] ?OFFICE PROGRESS NOTE ? ? ?Diagnosis: Pancreas cancer ? ?INTERVAL HISTORY:  ? ?Ms. Adeyemi returns as scheduled.  She completed a cycle of gemcitabine/Abraxane 02/04/2022.  She had nausea and felt fatigued on days 2 and 3.  Home nausea medications helped.  No mouth sores.  No diarrhea.  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  No numbness or tingling in the hands.  Ativan has not helped her with sleep.  She would like to go back to Ambien. ? ?Objective: ? ?Vital signs in last 24 hours: ? ?Blood pressure 132/80, pulse 95, temperature 97.8 ?F (36.6 ?C), temperature source Oral, resp. rate 20, height _0  (1.626 m), weight 188 lb (85.3 kg), SpO2 100 %. ?  ? ?HEENT: No thrush or ulcers. ?Resp: Lungs clear bilaterally. ?Cardio: Regular rate and rhythm. ?GI: Abdomen is soft.  Tender at the right upper abdomen.  No hepatomegaly. ?Vascular: No leg edema. ?Port-A-Cath without erythema. ? ?Lab Results: ? ?Lab Results  ?Component Value Date  ? WBC 7.5 02/04/2022  ? HGB 9.4 (L) 02/04/2022  ? HCT 28.8 (L) 02/04/2022  ? MCV 94.7 02/04/2022  ? PLT 276 02/04/2022  ? NEUTROABS 4.7 02/04/2022  ? ? ?Imaging: ? ?No results found. ? ?Medications: I have reviewed the patient's current medications. ? ?Assessment/Plan: ?Pancreas cancer ?-CT abdomen/pelvis without contrast 02/16/2021-multiple large hypoechoic masses in the patient's liver consistent metastatic disease, ill-defined masslike area in the pancreatic head measuring approximate 4.7 cm, possible filling defect in the right ventricle. ?-EGD 02/19/2021-large infiltrative mass with bleeding in the second portion of the duodenum, appears to be arising near the ampulla, partially obstructive with friable mucosa.  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 ?-Flexible sigmoidoscopy 02/19/2021-diverticulosis in the sigmoid colon, nonbleeding external and internal hemorrhoids ?-Cardiac CT 02/20/2021-mass in the mid RV attached to the  interventricular septum measuring 16 mm x 6 mm and concerning for metastatic tumor ?-MRI abdomen with/without contrast 02/21/2021-mass in the posterior pancreatic head measuring 4.3 x 3.8 cm with obstruction of the central pancreatic duct and diffuse dilatation up to 6 mm consistent with pancreatic adenocarcinoma, liver lesions the largest mass of the central left lobe measuring 9.9 x 9.2 cm consistent with hepatic metastatic disease,  hepatomegaly. ?-Ultrasound-guided biopsy of a left liver lesion 02/24/2021-adenocarcinoma, morphologically similar to the duodenal biopsy; microsatellite stable, tumor mutation burden 1 ?-Negative genetic testing ?-Palliative radiation to the duodenal mass 02/25/2021-03/06/2021 ?-Cycle 1 FOLFOX 03/17/2021 ?-Cycle 2 FOLFOX 04/01/2021 ?-Cycle 3 FOLFIRINOX 04/14/2021 ?-Cycle 4 FOLFIRINOX 04/27/2021, Udenyca added ?-Cycle 5 FOLFIRINOX 05/12/2021, Udenyca ?-MRI abdomen 05/23/2021-decrease in size of pancreas mass and hepatic lesions, no progressive disease ?-Cycle 6 FOLFIRINOX 05/27/2021 ?-CT 06/17/2021-multiple liver metastases similar to her prior exam.  Pancreas mass not significantly changed. ?-CT 06/26/2021-stable pancreas mass, no significant change in size and number of known liver metastases. ?-CT abdomen/pelvis 07/12/2021-new gastrostomy tube.  Multiple liver masses slightly decreased in size from prior exam.  Pancreas head mass unchanged. ?-Cycle 1 gemcitabine/Abraxane 07/30/2021 ?-Cycle 2 gemcitabine/Abraxane 08/13/2021 ?-Cycle 3 gemcitabine 08/27/2021, Abraxane held due to significant bilateral toe pain, question neuropathy ?-Cycle 4 gemcitabine, Abraxane held due to neuropathy, 09/10/2021 ?-Cycle 5 gemcitabine, Abraxane held due to neuropathy, 09/24/2021 ?-Cycle 6 gemcitabine, Abraxane held due to neuropathy 10/16/2021 ?-CTs 10/29/2021-mild progression of multifocal liver metastases; mild increase in size of hypoenhancing mass within the head of the pancreas; new mild pleural nodularity along the  posterior lower lobes ?-Cycle 7 gemcitabine/Abraxane 11/03/2021 ?-Cycle 8 gemcitabine/Abraxane 11/23/2021 ?-Cycle 9 gemcitabine/Abraxane 12/10/2021 ?-Cycle 10 Gemcitabine/Abraxane 12/24/2021 ?-  CT abdomen/pelvis 01/04/2022-decrease size of pancreas head mass, no change in multifocal hepatic metastases ?-Cycle 11 gemcitabine/Abraxane 01/07/2022 ?-Cycle 12 gemcitabine/Abraxane 02/04/2022 ?-Cycle 13 Gemcitabine/Abraxane 02/18/2022 ?2.  Iron deficiency anemia-improved ?-Feraheme 510 mg 02/20/2021 ?3.  Protein calorie malnutrition ?4.  Possible right ventricular filling defect on noncontrast CT scan ?MRI cardiac morphology 02/21/2021-mass in the mid RV attached to the interventricular septum measures 16 mm x 6 mm.  Mass appears heterogeneous with area of enhancement on LGE imaging.  Mass is concerning for metastatic tumor.  Normal LV size and systolic function.  Normal RV size and systolic function. ?5.  Hypertension ?6.  History of CVA ?7.  Hyperlipidemia ?8.  Hypothyroidism ?9.  Morbid obesity ?10.  Asthma ?11.  Migraines ?12.  Pain secondary to #1 ?13.  Port-A-Cath placement 02/25/2021 ?14.  Hypokalemia 04/01/2021-started a potassium supplement ?15.  Hospital admission 06/17/2021-intractable nausea and vomiting ?EGD 06/19/2021-diffuse gastritis, nonobstructing duodenal mass, biopsy of stomach-no malignancy or H. pylori, duodenal mass-adenocarcinoma ?16.  Hospital admission 07/12/2021 with nausea/vomiting, abdominal pain, and hypokalemia ?17.  Percutaneous gastrostomy tube placement 07/01/2021 ?  ?  ? ?Disposition: Ms. Lingo appears stable.  She completed a cycle of gemcitabine/Abraxane 2 weeks ago.  There is no clinical evidence of disease progression.  Plan to proceed with the next cycle today as scheduled. ? ?CBC from today reviewed.  Counts adequate to proceed as above. ? ?Prescription sent to her pharmacy for Ambien.  She understands to not combine Ambien with other sedating medications. ? ?She will return for lab, follow-up,  Gemcitabine/Abraxane in 2 weeks.  She will contact the office in the interim with any problems. ? ? ? ?[name]  ? ?02/18/2022  ?11:16 AM ? ? ? ? ? ? ? ?

## 2022-02-19 LAB — CANCER ANTIGEN 19-9: CA 19-9: 14934 U/mL — ABNORMAL HIGH (ref 0–35)

## 2022-02-22 ENCOUNTER — Other Ambulatory Visit: Payer: Self-pay

## 2022-02-22 MED ORDER — FLUCONAZOLE 100 MG PO TABS: 100.0000 mg | ORAL_TABLET | Freq: Every day | ORAL | 0 refills | Status: DC

## 2022-02-28 ENCOUNTER — Other Ambulatory Visit: Payer: Self-pay | Admitting: Oncology

## 2022-03-01 ENCOUNTER — Other Ambulatory Visit: Payer: Self-pay | Admitting: Oncology

## 2022-03-01 DIAGNOSIS — C250 Malignant neoplasm of head of pancreas: Secondary | ICD-10-CM

## 2022-03-01 DIAGNOSIS — J4540 Moderate persistent asthma, uncomplicated: Secondary | ICD-10-CM

## 2022-03-04 ENCOUNTER — Other Ambulatory Visit: Payer: Self-pay

## 2022-03-04 ENCOUNTER — Inpatient Hospital Stay: Payer: Medicaid Other

## 2022-03-04 ENCOUNTER — Encounter: Payer: Self-pay | Admitting: Nurse Practitioner

## 2022-03-04 ENCOUNTER — Inpatient Hospital Stay: Payer: Medicaid Other | Admitting: Nurse Practitioner

## 2022-03-04 ENCOUNTER — Other Ambulatory Visit: Payer: Self-pay | Admitting: Oncology

## 2022-03-04 ENCOUNTER — Encounter: Payer: Self-pay | Admitting: *Deleted

## 2022-03-04 ENCOUNTER — Inpatient Hospital Stay (HOSPITAL_BASED_OUTPATIENT_CLINIC_OR_DEPARTMENT_OTHER): Payer: Medicaid Other | Admitting: Nurse Practitioner

## 2022-03-04 VITALS — BP 143/90 | HR 83 | Temp 97.8°F | Resp 18 | Ht 64.0 in | Wt 192.0 lb

## 2022-03-04 VITALS — BP 152/91 | HR 76 | Resp 18

## 2022-03-04 DIAGNOSIS — Z5111 Encounter for antineoplastic chemotherapy: Secondary | ICD-10-CM | POA: Diagnosis not present

## 2022-03-04 DIAGNOSIS — C250 Malignant neoplasm of head of pancreas: Secondary | ICD-10-CM

## 2022-03-04 DIAGNOSIS — Z95828 Presence of other vascular implants and grafts: Secondary | ICD-10-CM

## 2022-03-04 LAB — CMP (CANCER CENTER ONLY)
ALT: 36 U/L (ref 0–44)
AST: 46 U/L — ABNORMAL HIGH (ref 15–41)
Albumin: 3.8 g/dL (ref 3.5–5.0)
Alkaline Phosphatase: 136 U/L — ABNORMAL HIGH (ref 38–126)
Anion gap: 8 (ref 5–15)
BUN: 22 mg/dL — ABNORMAL HIGH (ref 6–20)
CO2: 29 mmol/L (ref 22–32)
Calcium: 9.8 mg/dL (ref 8.9–10.3)
Chloride: 106 mmol/L (ref 98–111)
Creatinine: 0.84 mg/dL (ref 0.44–1.00)
GFR, Estimated: >60 mL/min (ref >60)
Glucose, Bld: 103 mg/dL — ABNORMAL HIGH (ref 70–99)
Potassium: 3.9 mmol/L (ref 3.5–5.1)
Sodium: 143 mmol/L (ref 135–145)
Total Bilirubin: 0.6 mg/dL (ref 0.3–1.2)
Total Protein: 6.9 g/dL (ref 6.5–8.1)

## 2022-03-04 LAB — CBC WITH DIFFERENTIAL (CANCER CENTER ONLY)
Abs Immature Granulocytes: 0.03 10*3/uL (ref 0.00–0.07)
Basophils Absolute: 0 10*3/uL (ref 0.0–0.1)
Basophils Relative: 0 %
Eosinophils Absolute: 0.2 10*3/uL (ref 0.0–0.5)
Eosinophils Relative: 4 %
HCT: 27.4 % — ABNORMAL LOW (ref 36.0–46.0)
Hemoglobin: 8.8 g/dL — ABNORMAL LOW (ref 12.0–15.0)
Immature Granulocytes: 1 %
Lymphocytes Relative: 10 %
Lymphs Abs: 0.5 10*3/uL — ABNORMAL LOW (ref 0.7–4.0)
MCH: 30.3 pg (ref 26.0–34.0)
MCHC: 32.1 g/dL (ref 30.0–36.0)
MCV: 94.5 fL (ref 80.0–100.0)
Monocytes Absolute: 0.5 10*3/uL (ref 0.1–1.0)
Monocytes Relative: 9 %
Neutro Abs: 4.2 10*3/uL (ref 1.7–7.7)
Neutrophils Relative %: 76 %
Platelet Count: 179 10*3/uL (ref 150–400)
RBC: 2.9 MIL/uL — ABNORMAL LOW (ref 3.87–5.11)
RDW: 16.2 % — ABNORMAL HIGH (ref 11.5–15.5)
WBC Count: 5.5 10*3/uL (ref 4.0–10.5)
nRBC: 0 % (ref 0.0–0.2)

## 2022-03-04 MED ORDER — ONDANSETRON HCL 4 MG/2ML IJ SOLN: 8.0000 mg | Freq: Once | INTRAMUSCULAR | Status: DC

## 2022-03-04 MED ORDER — HEPARIN SOD (PORK) LOCK FLUSH 100 UNIT/ML IV SOLN
500.0000 [IU] | Freq: Once | INTRAVENOUS | Status: AC
  Administered 2022-03-04: 500 [IU]

## 2022-03-04 MED ORDER — SODIUM CHLORIDE 0.9 % IV SOLN: INTRAVENOUS | Status: AC

## 2022-03-04 MED ORDER — SODIUM CHLORIDE 0.9 % IV SOLN: 8.0000 mg | Freq: Once | INTRAVENOUS | Status: DC

## 2022-03-04 MED ORDER — SODIUM CHLORIDE 0.9% FLUSH
10.0000 mL | Freq: Once | INTRAVENOUS | Status: AC
  Administered 2022-03-04: 10 mL

## 2022-03-04 MED ORDER — ONDANSETRON HCL 4 MG/2ML IJ SOLN
8.0000 mg | Freq: Once | INTRAMUSCULAR | Status: AC
  Administered 2022-03-04: 8 mg via INTRAVENOUS
  Filled 2022-03-04: qty 4

## 2022-03-04 NOTE — Progress Notes (Signed)
Patient seen by Ned Card NP today ? ? ?Per physician team,  Patient will not have chemotherapy today. IV fluids only.   ?

## 2022-03-04 NOTE — Progress Notes (Signed)
?[place] ?OFFICE PROGRESS NOTE ? ? ?Diagnosis: Pancreas cancer ? ?INTERVAL HISTORY:  ? ?Wendy Hoffman returns as scheduled.  She completed a cycle of gemcitabine/Abraxane 02/18/2022.  She was feeling well until last night when she developed nausea.  She has thrown up multiple times this morning.  She is tolerating water intermittently.  She most recently took Compazine with no improvement.  Bowels are moving.  No fever.  No bleeding.  She reports the nausea/vomiting occurs intermittently and typically sleeping relieves her symptoms.  Neuropathy symptoms are stable. ? ?Objective: ? ?Vital signs in last 24 hours: ? ?Blood pressure (!) 143/90, pulse 83, temperature 97.8 ?F (36.6 ?C), temperature source Oral, resp. rate 18, height '5\' 4"'  (1.626 m), weight 192 lb (87.1 kg), SpO2 100 %. ?  ? ?HEENT: Thick white coating over tongue.  No buccal thrush. ?Resp: Lungs clear bilaterally. ?Cardio: Regular rate and rhythm. ?GI: Abdomen is soft.  Tender right upper abdomen.  Bowel sounds active. ?Vascular: No leg edema. ?Port-A-Cath without erythema. ? ?Lab Results: ? ?Lab Results  ?Component Value Date  ? WBC 5.5 03/04/2022  ? HGB 8.8 (L) 03/04/2022  ? HCT 27.4 (L) 03/04/2022  ? MCV 94.5 03/04/2022  ? PLT 179 03/04/2022  ? NEUTROABS 4.2 03/04/2022  ? ? ?Imaging: ? ?No results found. ? ?Medications: I have reviewed the patient's current medications. ? ?Assessment/Plan: ?Pancreas cancer ?-CT abdomen/pelvis without contrast 02/16/2021-multiple large hypoechoic masses in the patient's liver consistent metastatic disease, ill-defined masslike area in the pancreatic head measuring approximate 4.7 cm, possible filling defect in the right ventricle. ?-EGD 02/19/2021-large infiltrative mass with bleeding in the second portion of the duodenum, appears to be arising near the ampulla, partially obstructive with friable mucosa.  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 ?-Flexible sigmoidoscopy  02/19/2021-diverticulosis in the sigmoid colon, nonbleeding external and internal hemorrhoids ?-Cardiac CT 02/20/2021-mass in the mid RV attached to the interventricular septum measuring 16 mm x 6 mm and concerning for metastatic tumor ?-MRI abdomen with/without contrast 02/21/2021-mass in the posterior pancreatic head measuring 4.3 x 3.8 cm with obstruction of the central pancreatic duct and diffuse dilatation up to 6 mm consistent with pancreatic adenocarcinoma, liver lesions the largest mass of the central left lobe measuring 9.9 x 9.2 cm consistent with hepatic metastatic disease,  hepatomegaly. ?-Ultrasound-guided biopsy of a left liver lesion 02/24/2021-adenocarcinoma, morphologically similar to the duodenal biopsy; microsatellite stable, tumor mutation burden 1 ?-Negative genetic testing ?-Palliative radiation to the duodenal mass 02/25/2021-03/06/2021 ?-Cycle 1 FOLFOX 03/17/2021 ?-Cycle 2 FOLFOX 04/01/2021 ?-Cycle 3 FOLFIRINOX 04/14/2021 ?-Cycle 4 FOLFIRINOX 04/27/2021, Udenyca added ?-Cycle 5 FOLFIRINOX 05/12/2021, Udenyca ?-MRI abdomen 05/23/2021-decrease in size of pancreas mass and hepatic lesions, no progressive disease ?-Cycle 6 FOLFIRINOX 05/27/2021 ?-CT 06/17/2021-multiple liver metastases similar to her prior exam.  Pancreas mass not significantly changed. ?-CT 06/26/2021-stable pancreas mass, no significant change in size and number of known liver metastases. ?-CT abdomen/pelvis 07/12/2021-new gastrostomy tube.  Multiple liver masses slightly decreased in size from prior exam.  Pancreas head mass unchanged. ?-Cycle 1 gemcitabine/Abraxane 07/30/2021 ?-Cycle 2 gemcitabine/Abraxane 08/13/2021 ?-Cycle 3 gemcitabine 08/27/2021, Abraxane held due to significant bilateral toe pain, question neuropathy ?-Cycle 4 gemcitabine, Abraxane held due to neuropathy, 09/10/2021 ?-Cycle 5 gemcitabine, Abraxane held due to neuropathy, 09/24/2021 ?-Cycle 6 gemcitabine, Abraxane held due to neuropathy 10/16/2021 ?-CTs 10/29/2021-mild  progression of multifocal liver metastases; mild increase in size of hypoenhancing mass within the head of the pancreas; new mild pleural nodularity along the posterior lower lobes ?-Cycle 7 gemcitabine/Abraxane  11/03/2021 ?-Cycle 8 gemcitabine/Abraxane 11/23/2021 ?-Cycle 9 gemcitabine/Abraxane 12/10/2021 ?-Cycle 10 Gemcitabine/Abraxane 12/24/2021 ?-CT abdomen/pelvis 01/04/2022-decrease size of pancreas head mass, no change in multifocal hepatic metastases ?-Cycle 11 gemcitabine/Abraxane 01/07/2022 ?-Cycle 12 gemcitabine/Abraxane 02/04/2022 ?-Cycle 13 Gemcitabine/Abraxane 02/18/2022 ?2.  Iron deficiency anemia-improved ?-Feraheme 510 mg 02/20/2021 ?3.  Protein calorie malnutrition ?4.  Possible right ventricular filling defect on noncontrast CT scan ?MRI cardiac morphology 02/21/2021-mass in the mid RV attached to the interventricular septum measures 16 mm x 6 mm.  Mass appears heterogeneous with area of enhancement on LGE imaging.  Mass is concerning for metastatic tumor.  Normal LV size and systolic function.  Normal RV size and systolic function. ?5.  Hypertension ?6.  History of CVA ?7.  Hyperlipidemia ?8.  Hypothyroidism ?9.  Morbid obesity ?10.  Asthma ?11.  Migraines ?12.  Pain secondary to #1 ?13.  Port-A-Cath placement 02/25/2021 ?14.  Hypokalemia 04/01/2021-started a potassium supplement ?15.  Hospital admission 06/17/2021-intractable nausea and vomiting ?EGD 06/19/2021-diffuse gastritis, nonobstructing duodenal mass, biopsy of stomach-no malignancy or H. pylori, duodenal mass-adenocarcinoma ?16.  Hospital admission 07/12/2021 with nausea/vomiting, abdominal pain, and hypokalemia ?17.  Percutaneous gastrostomy tube placement 07/01/2021 ? ?Disposition: Wendy Hoffman appears stable.  She has completed 13 cycles of gemcitabine/Abraxane.  She presents today with recent onset of nausea/vomiting.  We decided to hold today's treatment.  She will receive IV fluids and Zofran today.  She understands to contact the office if  symptoms persist, she develops constipation, fever, bleeding.  She will return for follow-up and chemotherapy in 1 week. ? ? ? ?[name]  ? ?03/04/2022  ?12:00 PM ? ? ? ? ? ? ? ?

## 2022-03-04 NOTE — Patient Instructions (Signed)

## 2022-03-05 ENCOUNTER — Telehealth: Payer: Self-pay

## 2022-03-05 ENCOUNTER — Other Ambulatory Visit: Payer: Self-pay | Admitting: Nurse Practitioner

## 2022-03-05 DIAGNOSIS — C259 Malignant neoplasm of pancreas, unspecified: Secondary | ICD-10-CM

## 2022-03-05 DIAGNOSIS — C250 Malignant neoplasm of head of pancreas: Secondary | ICD-10-CM

## 2022-03-05 LAB — CANCER ANTIGEN 19-9: CA 19-9: 16234 U/mL — ABNORMAL HIGH (ref 0–35)

## 2022-03-05 MED ORDER — GABAPENTIN 300 MG PO CAPS: ORAL_CAPSULE | ORAL | 0 refills | Status: DC

## 2022-03-05 MED ORDER — MORPHINE SULFATE ER 60 MG PO TBCR: 60.0000 mg | EXTENDED_RELEASE_TABLET | Freq: Two times a day (BID) | ORAL | 0 refills | Status: DC

## 2022-03-05 MED ORDER — MORPHINE SULFATE (CONCENTRATE) 10 MG/0.5ML PO SOLN: 10.0000 mg | ORAL | 0 refills | Status: DC | PRN

## 2022-03-05 NOTE — Telephone Encounter (Signed)
Patient called and requested a refill on her MS Contin, Morphine Sulfate, gabapentin, Compazine, and Zolpidem. The request is placed on the NP's desk ?

## 2022-03-05 NOTE — Telephone Encounter (Signed)
Called the patient to inform her scripts was send to pharmacy  ?

## 2022-03-08 ENCOUNTER — Inpatient Hospital Stay: Payer: Medicaid Other

## 2022-03-08 ENCOUNTER — Telehealth: Payer: Self-pay

## 2022-03-08 ENCOUNTER — Inpatient Hospital Stay: Payer: Medicaid Other | Admitting: Nurse Practitioner

## 2022-03-08 NOTE — Telephone Encounter (Signed)
Called the patient to do a reminder of her appointment with the provider. Patient stated she is at the dentist urgent appointment because she crack her tooth and she will not be coming. I will let the provider know.  ?

## 2022-03-11 ENCOUNTER — Inpatient Hospital Stay (HOSPITAL_BASED_OUTPATIENT_CLINIC_OR_DEPARTMENT_OTHER): Payer: Medicaid Other | Admitting: Oncology

## 2022-03-11 ENCOUNTER — Inpatient Hospital Stay: Payer: Medicaid Other

## 2022-03-11 ENCOUNTER — Encounter: Payer: Self-pay | Admitting: *Deleted

## 2022-03-11 VITALS — BP 100/60 | HR 85 | Temp 98.2°F | Resp 18 | Ht 64.0 in | Wt 185.0 lb

## 2022-03-11 DIAGNOSIS — C259 Malignant neoplasm of pancreas, unspecified: Secondary | ICD-10-CM | POA: Diagnosis not present

## 2022-03-11 DIAGNOSIS — C250 Malignant neoplasm of head of pancreas: Secondary | ICD-10-CM

## 2022-03-11 DIAGNOSIS — Z5111 Encounter for antineoplastic chemotherapy: Secondary | ICD-10-CM | POA: Diagnosis not present

## 2022-03-11 LAB — CBC WITH DIFFERENTIAL (CANCER CENTER ONLY)
Abs Immature Granulocytes: 0.02 10*3/uL (ref 0.00–0.07)
Basophils Absolute: 0 10*3/uL (ref 0.0–0.1)
Basophils Relative: 0 %
Eosinophils Absolute: 0.1 10*3/uL (ref 0.0–0.5)
Eosinophils Relative: 2 %
HCT: 28.3 % — ABNORMAL LOW (ref 36.0–46.0)
Hemoglobin: 9.2 g/dL — ABNORMAL LOW (ref 12.0–15.0)
Immature Granulocytes: 0 %
Lymphocytes Relative: 11 %
Lymphs Abs: 0.9 10*3/uL (ref 0.7–4.0)
MCH: 30.1 pg (ref 26.0–34.0)
MCHC: 32.5 g/dL (ref 30.0–36.0)
MCV: 92.5 fL (ref 80.0–100.0)
Monocytes Absolute: 0.9 10*3/uL (ref 0.1–1.0)
Monocytes Relative: 12 %
Neutro Abs: 5.8 10*3/uL (ref 1.7–7.7)
Neutrophils Relative %: 75 %
Platelet Count: 369 10*3/uL (ref 150–400)
RBC: 3.06 MIL/uL — ABNORMAL LOW (ref 3.87–5.11)
RDW: 16.2 % — ABNORMAL HIGH (ref 11.5–15.5)
WBC Count: 7.8 10*3/uL (ref 4.0–10.5)
nRBC: 0 % (ref 0.0–0.2)

## 2022-03-11 LAB — CMP (CANCER CENTER ONLY)
ALT: 16 U/L (ref 0–44)
AST: 20 U/L (ref 15–41)
Albumin: 3.9 g/dL (ref 3.5–5.0)
Alkaline Phosphatase: 125 U/L (ref 38–126)
Anion gap: 8 (ref 5–15)
BUN: 19 mg/dL (ref 6–20)
CO2: 27 mmol/L (ref 22–32)
Calcium: 9.5 mg/dL (ref 8.9–10.3)
Chloride: 98 mmol/L (ref 98–111)
Creatinine: 1.26 mg/dL — ABNORMAL HIGH (ref 0.44–1.00)
GFR, Estimated: 51 mL/min — ABNORMAL LOW (ref >60)
Glucose, Bld: 131 mg/dL — ABNORMAL HIGH (ref 70–99)
Potassium: 4.3 mmol/L (ref 3.5–5.1)
Sodium: 133 mmol/L — ABNORMAL LOW (ref 135–145)
Total Bilirubin: 0.6 mg/dL (ref 0.3–1.2)
Total Protein: 7 g/dL (ref 6.5–8.1)

## 2022-03-11 MED ORDER — SODIUM CHLORIDE 0.9% FLUSH
10.0000 mL | INTRAVENOUS | Status: DC | PRN
  Administered 2022-03-11: 10 mL

## 2022-03-11 MED ORDER — PACLITAXEL PROTEIN-BOUND CHEMO INJECTION 100 MG
100.0000 mg/m2 | Freq: Once | INTRAVENOUS | Status: AC
  Administered 2022-03-11: 200 mg via INTRAVENOUS
  Filled 2022-03-11: qty 40

## 2022-03-11 MED ORDER — HEPARIN SOD (PORK) LOCK FLUSH 100 UNIT/ML IV SOLN
500.0000 [IU] | Freq: Once | INTRAVENOUS | Status: AC | PRN
  Administered 2022-03-11: 500 [IU]

## 2022-03-11 MED ORDER — SODIUM CHLORIDE 0.9 % IV SOLN: Freq: Once | INTRAVENOUS | Status: AC

## 2022-03-11 MED ORDER — SODIUM CHLORIDE 0.9 % IV SOLN
1000.0000 mg/m2 | Freq: Once | INTRAVENOUS | Status: AC
  Administered 2022-03-11: 1900 mg via INTRAVENOUS
  Filled 2022-03-11: qty 49.97

## 2022-03-11 MED ORDER — PROCHLORPERAZINE MALEATE 10 MG PO TABS
10.0000 mg | ORAL_TABLET | Freq: Once | ORAL | Status: AC
  Administered 2022-03-11: 10 mg via ORAL

## 2022-03-11 NOTE — Progress Notes (Signed)
?[place] ?OFFICE PROGRESS NOTE ? ? ?Diagnosis: Pancreas cancer ? ?INTERVAL HISTORY:  ? ?Wendy Hoffman returns as scheduled.  She feels better compared to when she was here with nausea and vomiting last week.  She continues to have intermittent nausea.  She is eating and drinking fluids.  Her pain is adequately controlled with MS Contin and Roxanol.  She currently has a migraine headache.  These have occurred in the past. ? ?Objective: ? ?Vital signs in last 24 hours: ? ?Blood pressure 100/60, pulse 85, temperature 98.2 ?F (36.8 ?C), temperature source Oral, resp. rate 18, height '5\' 4"'  (1.626 m), weight 185 lb (83.9 kg), SpO2 100 %. ?  ? ?HEENT: Mild white coat over the tongue, no buccal thrush or ulcers ?Resp: Lungs clear bilaterally ?Cardio: Regular rate and rhythm ?GI: Tender in the right and mid upper abdomen ?Vascular: No leg edema  ?Skin: Upper pigmented less than 1 cm papules at the lower legs bilaterally ? ?Portacath/PICC-without erythema ? ?Lab Results: ? ?Lab Results  ?Component Value Date  ? WBC 7.8 03/11/2022  ? HGB 9.2 (L) 03/11/2022  ? HCT 28.3 (L) 03/11/2022  ? MCV 92.5 03/11/2022  ? PLT 369 03/11/2022  ? NEUTROABS 5.8 03/11/2022  ? ? ?CMP  ?Lab Results  ?Component Value Date  ? NA 143 03/04/2022  ? K 3.9 03/04/2022  ? CL 106 03/04/2022  ? CO2 29 03/04/2022  ? GLUCOSE 103 (H) 03/04/2022  ? BUN 22 (H) 03/04/2022  ? CREATININE 0.84 03/04/2022  ? CALCIUM 9.8 03/04/2022  ? PROT 6.9 03/04/2022  ? ALBUMIN 3.8 03/04/2022  ? AST 46 (H) 03/04/2022  ? ALT 36 03/04/2022  ? ALKPHOS 136 (H) 03/04/2022  ? BILITOT 0.6 03/04/2022  ? GFRNONAA >60 03/04/2022  ? GFRAA >60 07/21/2020  ? ? ?Lab Results  ?Component Value Date  ? CEA1 68.8 (H) 02/23/2021  ? CEA <0.5 10/15/2008  ? YWV371 16,234 (H) 03/04/2022  ? ? ? ?Medications: I have reviewed the patient's current medications. ? ? ?Assessment/Plan: ?Pancreas cancer ?-CT abdomen/pelvis without contrast 02/16/2021-multiple large hypoechoic masses in the  patient's liver consistent metastatic disease, ill-defined masslike area in the pancreatic head measuring approximate 4.7 cm, possible filling defect in the right ventricle. ?-EGD 02/19/2021-large infiltrative mass with bleeding in the second portion of the duodenum, appears to be arising near the ampulla, partially obstructive with friable mucosa.  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 ?-Flexible sigmoidoscopy 02/19/2021-diverticulosis in the sigmoid colon, nonbleeding external and internal hemorrhoids ?-Cardiac CT 02/20/2021-mass in the mid RV attached to the interventricular septum measuring 16 mm x 6 mm and concerning for metastatic tumor ?-MRI abdomen with/without contrast 02/21/2021-mass in the posterior pancreatic head measuring 4.3 x 3.8 cm with obstruction of the central pancreatic duct and diffuse dilatation up to 6 mm consistent with pancreatic adenocarcinoma, liver lesions the largest mass of the central left lobe measuring 9.9 x 9.2 cm consistent with hepatic metastatic disease,  hepatomegaly. ?-Ultrasound-guided biopsy of a left liver lesion 02/24/2021-adenocarcinoma, morphologically similar to the duodenal biopsy; microsatellite stable, tumor mutation burden 1 ?-Negative genetic testing ?-Palliative radiation to the duodenal mass 02/25/2021-03/06/2021 ?-Cycle 1 FOLFOX 03/17/2021 ?-Cycle 2 FOLFOX 04/01/2021 ?-Cycle 3 FOLFIRINOX 04/14/2021 ?-Cycle 4 FOLFIRINOX 04/27/2021, Udenyca added ?-Cycle 5 FOLFIRINOX 05/12/2021, Udenyca ?-MRI abdomen 05/23/2021-decrease in size of pancreas mass and hepatic lesions, no progressive disease ?-Cycle 6 FOLFIRINOX 05/27/2021 ?-CT 06/17/2021-multiple liver metastases similar to her prior exam.  Pancreas mass not significantly changed. ?-CT 06/26/2021-stable pancreas mass, no significant change  in size and number of known liver metastases. ?-CT abdomen/pelvis 07/12/2021-new gastrostomy tube.  Multiple liver masses slightly decreased in size from prior exam.   Pancreas head mass unchanged. ?-Cycle 1 gemcitabine/Abraxane 07/30/2021 ?-Cycle 2 gemcitabine/Abraxane 08/13/2021 ?-Cycle 3 gemcitabine 08/27/2021, Abraxane held due to significant bilateral toe pain, question neuropathy ?-Cycle 4 gemcitabine, Abraxane held due to neuropathy, 09/10/2021 ?-Cycle 5 gemcitabine, Abraxane held due to neuropathy, 09/24/2021 ?-Cycle 6 gemcitabine, Abraxane held due to neuropathy 10/16/2021 ?-CTs 10/29/2021-mild progression of multifocal liver metastases; mild increase in size of hypoenhancing mass within the head of the pancreas; new mild pleural nodularity along the posterior lower lobes ?-Cycle 7 gemcitabine/Abraxane 11/03/2021 ?-Cycle 8 gemcitabine/Abraxane 11/23/2021 ?-Cycle 9 gemcitabine/Abraxane 12/10/2021 ?-Cycle 10 Gemcitabine/Abraxane 12/24/2021 ?-CT abdomen/pelvis 01/04/2022-decrease size of pancreas head mass, no change in multifocal hepatic metastases ?-Cycle 11 gemcitabine/Abraxane 01/07/2022 ?-Cycle 12 gemcitabine/Abraxane 02/04/2022 ?-Cycle 13 Gemcitabine/Abraxane 02/18/2022 ?-Cycle 14 gemcitabine/Abraxane 03/11/2022 ?2.  Iron deficiency anemia-improved ?-Feraheme 510 mg 02/20/2021 ?3.  Protein calorie malnutrition ?4.  Possible right ventricular filling defect on noncontrast CT scan ?MRI cardiac morphology 02/21/2021-mass in the mid RV attached to the interventricular septum measures 16 mm x 6 mm.  Mass appears heterogeneous with area of enhancement on LGE imaging.  Mass is concerning for metastatic tumor.  Normal LV size and systolic function.  Normal RV size and systolic function. ?5.  Hypertension ?6.  History of CVA ?7.  Hyperlipidemia ?8.  Hypothyroidism ?9.  Morbid obesity ?10.  Asthma ?11.  Migraines ?12.  Pain secondary to #1 ?13.  Port-A-Cath placement 02/25/2021 ?14.  Hypokalemia 04/01/2021-started a potassium supplement ?15.  Hospital admission 06/17/2021-intractable nausea and vomiting ?EGD 06/19/2021-diffuse gastritis, nonobstructing duodenal mass, biopsy of stomach-no malignancy  or H. pylori, duodenal mass-adenocarcinoma ?16.  Hospital admission 07/12/2021 with nausea/vomiting, abdominal pain, and hypokalemia ?17.  Percutaneous gastrostomy tube placement 07/01/2021 ? ? ? ?Disposition: ?Wendy Hoffman appears stable.  She will complete another treatment with gemcitabine/Abraxane today.  She will return for an office visit in the next cycle of chemotherapy on 03/29/2022.  She will seek medical attention if the migraine headache does not improve. ? ?Betsy Coder, MD ? ?03/11/2022  ?10:13 AM ? ? ?

## 2022-03-11 NOTE — Patient Instructions (Signed)
[facility]   ?Discharge Instructions: ?Thank you for choosing [facility] to provide your oncology and hematology care.  ? ?If you have a lab appointment with the [facility], please go directly to the [facility] and check in at the registration area. ?  ?Wear comfortable clothing and clothing appropriate for easy access to any Portacath or PICC line.  ? ?We strive to give you quality time with your provider. You may need to reschedule your appointment if you arrive late (15 or more minutes).  Arriving late affects you and other patients whose appointments are after yours.  Also, if you miss three or more appointments without notifying the office, you may be dismissed from the clinic at the provider?s discretion.    ?  ?For prescription refill requests, have your pharmacy contact our office and allow 72 hours for refills to be completed.   ? ?Today you received the following chemotherapy and/or immunotherapy agents Paclitaxel-protein bound (ABRAXANE) & Gemcitabine (GEMZAR).    ?  ?To help prevent nausea and vomiting after your treatment, we encourage you to take your nausea medication as directed. ? ?BELOW ARE SYMPTOMS THAT SHOULD BE REPORTED IMMEDIATELY: ?*FEVER GREATER THAN 100.4 F (38 ?C) OR HIGHER ?*CHILLS OR SWEATING ?*NAUSEA AND VOMITING THAT IS NOT CONTROLLED WITH YOUR NAUSEA MEDICATION ?*UNUSUAL SHORTNESS OF BREATH ?*UNUSUAL BRUISING OR BLEEDING ?*URINARY PROBLEMS (pain or burning when urinating, or frequent urination) ?*BOWEL PROBLEMS (unusual diarrhea, constipation, pain near the anus) ?TENDERNESS IN MOUTH AND THROAT WITH OR WITHOUT PRESENCE OF ULCERS (sore throat, sores in mouth, or a toothache) ?UNUSUAL RASH, SWELLING OR PAIN  ?UNUSUAL VAGINAL DISCHARGE OR ITCHING  ? ?Items with * indicate a potential emergency and should be followed up as soon as possible or go to the Emergency Department if any problems should occur. ? ?Please show the CHEMOTHERAPY ALERT  CARD or IMMUNOTHERAPY ALERT CARD at check-in to the Emergency Department and triage nurse. ? ?Should you have questions after your visit or need to cancel or reschedule your appointment, please contact [facility]  Dept: [phone]  and follow the prompts.  Office hours are 8:00 a.m. to 4:30 p.m. Monday - Friday. Please note that voicemails left after 4:00 p.m. may not be returned until the following business day.  We are closed weekends and major holidays. You have access to a nurse at all times for urgent questions. Please call the main number to the clinic Dept: [phone] and follow the prompts. ? ? ?For any non-urgent questions, you may also contact your provider using MyChart. We now offer e-Visits for anyone 15 and older to request care online for non-urgent symptoms. For details visit [email or web address]. ?  ?Also download the MyChart app! Go to the app store, search "MyChart", open the app, select [facility], and log in with your MyChart username and password. ? ?Due to Covid, a mask is required upon entering the hospital/clinic. If you do not have a mask, one will be given to you upon arrival. For doctor visits, patients may have 1 support person aged 54 or older with them. For treatment visits, patients cannot have anyone with them due to current Covid guidelines and our immunocompromised population.  ? ?Nanoparticle Albumin-Bound Paclitaxel injection ?What is this medication? ?NANOPARTICLE ALBUMIN-BOUND PACLITAXEL (Na no PAHR ti kuhl  al BYOO muhn-bound  PAK li TAX el) is a chemotherapy drug. It targets fast dividing cells, like cancer cells, and causes these cells to die.  This medicine is used to treat advanced breast cancer, lung cancer, and pancreatic cancer. ?This medicine may be used for other purposes; ask your health care provider or pharmacist if you have questions. ?COMMON BRAND NAME(S): Abraxane ?What should I tell my care team before I take this  medication? ?They need to know if you have any of these conditions: ?kidney disease ?liver disease ?low blood counts, like low white cell, platelet, or red cell counts ?lung or breathing disease, like asthma ?tingling of the fingers or toes, or other nerve disorder ?an unusual or allergic reaction to paclitaxel, albumin, other chemotherapy, other medicines, foods, dyes, or preservatives ?pregnant or trying to get pregnant ?breast-feeding ?How should I use this medication? ?This drug is given as an infusion into a vein. It is administered in a hospital or clinic by a specially trained health care professional. ?Talk to your pediatrician regarding the use of this medicine in children. Special care may be needed. ?Overdosage: If you think you have taken too much of this medicine contact a poison control center or emergency room at once. ?NOTE: This medicine is only for you. Do not share this medicine with others. ?What if I miss a dose? ?It is important not to miss your dose. Call your doctor or health care professional if you are unable to keep an appointment. ?What may interact with this medication? ?This medicine may interact with the following medications: ?antiviral medicines for hepatitis, HIV or AIDS ?certain antibiotics like erythromycin and clarithromycin ?certain medicines for fungal infections like ketoconazole and itraconazole ?certain medicines for seizures like carbamazepine, phenobarbital, phenytoin ?gemfibrozil ?nefazodone ?rifampin ?St. John's wort ?This list may not describe all possible interactions. Give your health care provider a list of all the medicines, herbs, non-prescription drugs, or dietary supplements you use. Also tell them if you smoke, drink alcohol, or use illegal drugs. Some items may interact with your medicine. ?What should I watch for while using this medication? ?Your condition will be monitored carefully while you are receiving this medicine. You will need important blood work  done while you are taking this medicine. ?This medicine can cause serious allergic reactions. If you experience allergic reactions like skin rash, itching or hives, swelling of the face, lips, or tongue, tell your doctor or health care professional right away. ?In some cases, you may be given additional medicines to help with side effects. Follow all directions for their use. ?This drug may make you feel generally unwell. This is not uncommon, as chemotherapy can affect healthy cells as well as cancer cells. Report any side effects. Continue your course of treatment even though you feel ill unless your doctor tells you to stop. ?Call your doctor or health care professional for advice if you get a fever, chills or sore throat, or other symptoms of a cold or flu. Do not treat yourself. This drug decreases your body's ability to fight infections. Try to avoid being around people who are sick. ?This medicine may increase your risk to bruise or bleed. Call your doctor or health care professional if you notice any unusual bleeding. ?Be careful brushing and flossing your teeth or using a toothpick because you may get an infection or bleed more easily. If you have any dental work done, tell your dentist you are receiving this medicine. ?Avoid taking products that contain aspirin, acetaminophen, ibuprofen, naproxen, or ketoprofen unless instructed by your doctor. These medicines may hide a fever. ?Do not become pregnant while taking this medicine or for 6 months after  stopping it. Women should inform their doctor if they wish to become pregnant or think they might be pregnant. Men should not father a child while taking this medicine or for 3 months after stopping it. There is a potential for serious side effects to an unborn child. Talk to your health care professional or pharmacist for more information. ?Do not breast-feed an infant while taking this medicine or for 2 weeks after stopping it. ?This medicine may interfere  with the ability to get pregnant or to father a child. You should talk to your doctor or health care professional if you are concerned about your fertility. ?What side effects may I notice from receiving this medicat

## 2022-03-11 NOTE — Progress Notes (Signed)
Patient presents for treatment. RN assessment completed along with the following: ? ?Labs/vitals reviewed - Yes, and within treatment parameters.   ?Weight within 10% of previous measurement - Yes ?Informed consent completed and reflects current therapy/intent - Yes, on date 07/30/2021             ?Provider progress note reviewed - Today's provider note is not yet available. I reviewed the most recent oncology provider progress note in chart dated 03/04/2021. ?Treatment/Antibody/Supportive plan reviewed - Yes, and there are no adjustments needed for today's treatment. ?S&H and other orders reviewed - Yes, and there are no additional orders identified. ?Previous treatment date reviewed - Yes, and the appropriate amount of time has elapsed between treatments. ?Clinic Hand Off Received from - Yes from [facility], RN ? ?Patient to proceed with treatment.  ? ?

## 2022-03-11 NOTE — Progress Notes (Signed)
Patient seen by Dr. Benay Spice today ? ?Vitals are within treatment parameters. OK to treat with 7 lb weight loss and lower than normal BP. ? ?Labs reviewed by Dr. Benay Spice and are within treatment parameters. ? ?Per physician team, patient is ready for treatment and there are NO modifications to the treatment plan.  ?

## 2022-03-18 ENCOUNTER — Inpatient Hospital Stay: Payer: Medicaid Other | Admitting: Oncology

## 2022-03-18 ENCOUNTER — Inpatient Hospital Stay: Payer: Medicaid Other

## 2022-03-23 ENCOUNTER — Other Ambulatory Visit: Payer: Self-pay | Admitting: Oncology

## 2022-03-29 ENCOUNTER — Inpatient Hospital Stay: Payer: Medicaid Other | Attending: Nurse Practitioner

## 2022-03-29 ENCOUNTER — Inpatient Hospital Stay: Payer: Medicaid Other

## 2022-03-29 ENCOUNTER — Encounter: Payer: Self-pay | Admitting: *Deleted

## 2022-03-29 ENCOUNTER — Other Ambulatory Visit: Payer: Self-pay | Admitting: Nurse Practitioner

## 2022-03-29 ENCOUNTER — Inpatient Hospital Stay (HOSPITAL_BASED_OUTPATIENT_CLINIC_OR_DEPARTMENT_OTHER): Payer: Medicaid Other | Admitting: Oncology

## 2022-03-29 ENCOUNTER — Encounter: Payer: Self-pay | Admitting: Oncology

## 2022-03-29 VITALS — BP 103/62 | HR 88 | Temp 98.2°F | Resp 18 | Ht 64.0 in | Wt 190.6 lb

## 2022-03-29 DIAGNOSIS — Z5111 Encounter for antineoplastic chemotherapy: Secondary | ICD-10-CM | POA: Diagnosis present

## 2022-03-29 DIAGNOSIS — B370 Candidal stomatitis: Secondary | ICD-10-CM | POA: Diagnosis not present

## 2022-03-29 DIAGNOSIS — C250 Malignant neoplasm of head of pancreas: Secondary | ICD-10-CM

## 2022-03-29 DIAGNOSIS — C787 Secondary malignant neoplasm of liver and intrahepatic bile duct: Secondary | ICD-10-CM | POA: Diagnosis not present

## 2022-03-29 DIAGNOSIS — J4540 Moderate persistent asthma, uncomplicated: Secondary | ICD-10-CM | POA: Diagnosis not present

## 2022-03-29 LAB — CBC WITH DIFFERENTIAL (CANCER CENTER ONLY)
Abs Immature Granulocytes: 0.01 10*3/uL (ref 0.00–0.07)
Basophils Absolute: 0 10*3/uL (ref 0.0–0.1)
Basophils Relative: 1 %
Eosinophils Absolute: 0.3 10*3/uL (ref 0.0–0.5)
Eosinophils Relative: 6 %
HCT: 28.7 % — ABNORMAL LOW (ref 36.0–46.0)
Hemoglobin: 9.2 g/dL — ABNORMAL LOW (ref 12.0–15.0)
Immature Granulocytes: 0 %
Lymphocytes Relative: 18 %
Lymphs Abs: 0.9 10*3/uL (ref 0.7–4.0)
MCH: 29.8 pg (ref 26.0–34.0)
MCHC: 32.1 g/dL (ref 30.0–36.0)
MCV: 92.9 fL (ref 80.0–100.0)
Monocytes Absolute: 0.4 10*3/uL (ref 0.1–1.0)
Monocytes Relative: 8 %
Neutro Abs: 3.2 10*3/uL (ref 1.7–7.7)
Neutrophils Relative %: 67 %
Platelet Count: 318 10*3/uL (ref 150–400)
RBC: 3.09 MIL/uL — ABNORMAL LOW (ref 3.87–5.11)
RDW: 16.2 % — ABNORMAL HIGH (ref 11.5–15.5)
WBC Count: 4.8 10*3/uL (ref 4.0–10.5)
nRBC: 0 % (ref 0.0–0.2)

## 2022-03-29 LAB — CMP (CANCER CENTER ONLY)
ALT: 11 U/L (ref 0–44)
AST: 15 U/L (ref 15–41)
Albumin: 3.6 g/dL (ref 3.5–5.0)
Alkaline Phosphatase: 126 U/L (ref 38–126)
Anion gap: 8 (ref 5–15)
BUN: 21 mg/dL — ABNORMAL HIGH (ref 6–20)
CO2: 30 mmol/L (ref 22–32)
Calcium: 9.5 mg/dL (ref 8.9–10.3)
Chloride: 103 mmol/L (ref 98–111)
Creatinine: 0.96 mg/dL (ref 0.44–1.00)
GFR, Estimated: >60 mL/min (ref >60)
Glucose, Bld: 112 mg/dL — ABNORMAL HIGH (ref 70–99)
Potassium: 4.1 mmol/L (ref 3.5–5.1)
Sodium: 141 mmol/L (ref 135–145)
Total Bilirubin: 0.3 mg/dL (ref 0.3–1.2)
Total Protein: 6.5 g/dL (ref 6.5–8.1)

## 2022-03-29 MED ORDER — FLUCONAZOLE 100 MG PO TABS: 100.0000 mg | ORAL_TABLET | Freq: Every day | ORAL | 1 refills | Status: AC

## 2022-03-29 MED ORDER — SODIUM CHLORIDE 0.9 % IV SOLN
1000.0000 mg/m2 | Freq: Once | INTRAVENOUS | Status: AC
  Administered 2022-03-29: 1900 mg via INTRAVENOUS
  Filled 2022-03-29: qty 49.97

## 2022-03-29 MED ORDER — PACLITAXEL PROTEIN-BOUND CHEMO INJECTION 100 MG
100.0000 mg/m2 | Freq: Once | INTRAVENOUS | Status: AC
  Administered 2022-03-29: 200 mg via INTRAVENOUS
  Filled 2022-03-29: qty 40

## 2022-03-29 MED ORDER — HEPARIN SOD (PORK) LOCK FLUSH 100 UNIT/ML IV SOLN
500.0000 [IU] | Freq: Once | INTRAVENOUS | Status: AC | PRN
  Administered 2022-03-29: 500 [IU]

## 2022-03-29 MED ORDER — ZOLPIDEM TARTRATE 10 MG PO TABS: ORAL_TABLET | ORAL | 0 refills | Status: DC

## 2022-03-29 MED ORDER — SODIUM CHLORIDE 0.9% FLUSH
10.0000 mL | INTRAVENOUS | Status: DC | PRN
  Administered 2022-03-29: 10 mL

## 2022-03-29 MED ORDER — ALBUTEROL SULFATE HFA 108 (90 BASE) MCG/ACT IN AERS: INHALATION_SPRAY | RESPIRATORY_TRACT | 3 refills | Status: DC

## 2022-03-29 MED ORDER — SODIUM CHLORIDE 0.9 % IV SOLN: Freq: Once | INTRAVENOUS | Status: AC

## 2022-03-29 MED ORDER — PROCHLORPERAZINE MALEATE 10 MG PO TABS
10.0000 mg | ORAL_TABLET | Freq: Once | ORAL | Status: AC
  Administered 2022-03-29: 10 mg via ORAL
  Filled 2022-03-29: qty 1

## 2022-03-29 NOTE — Patient Instructions (Signed)
[facility]  ? Discharge Instructions: ?Thank you for choosing [facility] to provide your oncology and hematology care.  ? ?If you have a lab appointment with the [facility], please go directly to the [facility] and check in at the registration area. ?  ?Wear comfortable clothing and clothing appropriate for easy access to any Portacath or PICC line.  ? ?We strive to give you quality time with your provider. You may need to reschedule your appointment if you arrive late (15 or more minutes).  Arriving late affects you and other patients whose appointments are after yours.  Also, if you miss three or more appointments without notifying the office, you may be dismissed from the clinic at the provider?s discretion.    ?  ?For prescription refill requests, have your pharmacy contact our office and allow 72 hours for refills to be completed.   ? ?Today you received the following chemotherapy and/or immunotherapy agents Abraxane, Gemzar    ?  ?To help prevent nausea and vomiting after your treatment, we encourage you to take your nausea medication as directed. ? ?BELOW ARE SYMPTOMS THAT SHOULD BE REPORTED IMMEDIATELY: ?*FEVER GREATER THAN 100.4 F (38 ?C) OR HIGHER ?*CHILLS OR SWEATING ?*NAUSEA AND VOMITING THAT IS NOT CONTROLLED WITH YOUR NAUSEA MEDICATION ?*UNUSUAL SHORTNESS OF BREATH ?*UNUSUAL BRUISING OR BLEEDING ?*URINARY PROBLEMS (pain or burning when urinating, or frequent urination) ?*BOWEL PROBLEMS (unusual diarrhea, constipation, pain near the anus) ?TENDERNESS IN MOUTH AND THROAT WITH OR WITHOUT PRESENCE OF ULCERS (sore throat, sores in mouth, or a toothache) ?UNUSUAL RASH, SWELLING OR PAIN  ?UNUSUAL VAGINAL DISCHARGE OR ITCHING  ? ?Items with * indicate a potential emergency and should be followed up as soon as possible or go to the Emergency Department if any problems should occur. ? ?Please show the CHEMOTHERAPY ALERT CARD or IMMUNOTHERAPY ALERT CARD at  check-in to the Emergency Department and triage nurse. ? ?Should you have questions after your visit or need to cancel or reschedule your appointment, please contact [facility]  Dept: [phone]  and follow the prompts.  Office hours are 8:00 a.m. to 4:30 p.m. Monday - Friday. Please note that voicemails left after 4:00 p.m. may not be returned until the following business day.  We are closed weekends and major holidays. You have access to a nurse at all times for urgent questions. Please call the main number to the clinic Dept: [phone] and follow the prompts. ? ? ?For any non-urgent questions, you may also contact your provider using MyChart. We now offer e-Visits for anyone 55 and older to request care online for non-urgent symptoms. For details visit [email or web address]. ?  ?Also download the MyChart app! Go to the app store, search "MyChart", open the app, select [facility], and log in with your MyChart username and password. ? ?Due to Covid, a mask is required upon entering the hospital/clinic. If you do not have a mask, one will be given to you upon arrival. For doctor visits, patients may have 1 support person aged 76 or older with them. For treatment visits, patients cannot have anyone with them due to current Covid guidelines and our immunocompromised population.  ? ?Nanoparticle Albumin-Bound Paclitaxel injection ?What is this medication? ?NANOPARTICLE ALBUMIN-BOUND PACLITAXEL (Na no PAHR ti kuhl  al BYOO muhn-bound  PAK li TAX el) is a chemotherapy drug. It targets fast dividing cells, like cancer cells, and causes these cells to die. This medicine is used  to treat advanced breast cancer, lung cancer, and pancreatic cancer. ?This medicine may be used for other purposes; ask your health care provider or pharmacist if you have questions. ?COMMON BRAND NAME(S): Abraxane ?What should I tell my care team before I take this medication? ?They need to know if you have any  of these conditions: ?kidney disease ?liver disease ?low blood counts, like low white cell, platelet, or red cell counts ?lung or breathing disease, like asthma ?tingling of the fingers or toes, or other nerve disorder ?an unusual or allergic reaction to paclitaxel, albumin, other chemotherapy, other medicines, foods, dyes, or preservatives ?pregnant or trying to get pregnant ?breast-feeding ?How should I use this medication? ?This drug is given as an infusion into a vein. It is administered in a hospital or clinic by a specially trained health care professional. ?Talk to your pediatrician regarding the use of this medicine in children. Special care may be needed. ?Overdosage: If you think you have taken too much of this medicine contact a poison control center or emergency room at once. ?NOTE: This medicine is only for you. Do not share this medicine with others. ?What if I miss a dose? ?It is important not to miss your dose. Call your doctor or health care professional if you are unable to keep an appointment. ?What may interact with this medication? ?This medicine may interact with the following medications: ?antiviral medicines for hepatitis, HIV or AIDS ?certain antibiotics like erythromycin and clarithromycin ?certain medicines for fungal infections like ketoconazole and itraconazole ?certain medicines for seizures like carbamazepine, phenobarbital, phenytoin ?gemfibrozil ?nefazodone ?rifampin ?St. John's wort ?This list may not describe all possible interactions. Give your health care provider a list of all the medicines, herbs, non-prescription drugs, or dietary supplements you use. Also tell them if you smoke, drink alcohol, or use illegal drugs. Some items may interact with your medicine. ?What should I watch for while using this medication? ?Your condition will be monitored carefully while you are receiving this medicine. You will need important blood work done while you are taking this medicine. ?This  medicine can cause serious allergic reactions. If you experience allergic reactions like skin rash, itching or hives, swelling of the face, lips, or tongue, tell your doctor or health care professional right away. ?In some cases, you may be given additional medicines to help with side effects. Follow all directions for their use. ?This drug may make you feel generally unwell. This is not uncommon, as chemotherapy can affect healthy cells as well as cancer cells. Report any side effects. Continue your course of treatment even though you feel ill unless your doctor tells you to stop. ?Call your doctor or health care professional for advice if you get a fever, chills or sore throat, or other symptoms of a cold or flu. Do not treat yourself. This drug decreases your body's ability to fight infections. Try to avoid being around people who are sick. ?This medicine may increase your risk to bruise or bleed. Call your doctor or health care professional if you notice any unusual bleeding. ?Be careful brushing and flossing your teeth or using a toothpick because you may get an infection or bleed more easily. If you have any dental work done, tell your dentist you are receiving this medicine. ?Avoid taking products that contain aspirin, acetaminophen, ibuprofen, naproxen, or ketoprofen unless instructed by your doctor. These medicines may hide a fever. ?Do not become pregnant while taking this medicine or for 6 months after stopping it. Women should  inform their doctor if they wish to become pregnant or think they might be pregnant. Men should not father a child while taking this medicine or for 3 months after stopping it. There is a potential for serious side effects to an unborn child. Talk to your health care professional or pharmacist for more information. ?Do not breast-feed an infant while taking this medicine or for 2 weeks after stopping it. ?This medicine may interfere with the ability to get pregnant or to father a  child. You should talk to your doctor or health care professional if you are concerned about your fertility. ?What side effects may I notice from receiving this medication? ?Side effects that you should report t

## 2022-03-29 NOTE — Patient Instructions (Signed)

## 2022-03-29 NOTE — Progress Notes (Signed)
?[facility] ?OFFICE PROGRESS NOTE ? ? ?Diagnosis: Pancreas cancer ? ?INTERVAL HISTORY:  ? ?Wendy Hoffman complete another treatment with gemcitabine/Abraxane on 03/11/2022.  She reports tolerating the chemotherapy well.  No rash or fever.  No change in foot numbness.  She does not have nausea or vomiting at present.  Pain is controlled with MS Contin and Roxanol. ? ?Objective: ? ?Vital signs in last 24 hours: ? ?Blood pressure 103/62, pulse 88, temperature 98.2 ?F (36.8 ?C), temperature source Oral, resp. rate 18, height '5\' 4"'  (1.626 m), weight 190 lb 9.6 oz (86.5 kg), SpO2 98 %. ?  ? ?HEENT: Thrush at the pharynx and tongue ?Resp: Lungs clear bilaterally ?Cardio: Regular rate and rhythm ?GI: tender in the mid and right upper abdomen ?Vascular: No leg edema  ? ?Port-A-Cath without erythema ? ?Lab Results: ? ?Lab Results  ?Component Value Date  ? WBC 4.8 03/29/2022  ? HGB 9.2 (L) 03/29/2022  ? HCT 28.7 (L) 03/29/2022  ? MCV 92.9 03/29/2022  ? PLT 318 03/29/2022  ? NEUTROABS 3.2 03/29/2022  ? ? ?CMP  ?Lab Results  ?Component Value Date  ? NA 133 (L) 03/11/2022  ? K 4.3 03/11/2022  ? CL 98 03/11/2022  ? CO2 27 03/11/2022  ? GLUCOSE 131 (H) 03/11/2022  ? BUN 19 03/11/2022  ? CREATININE 1.26 (H) 03/11/2022  ? CALCIUM 9.5 03/11/2022  ? PROT 7.0 03/11/2022  ? ALBUMIN 3.9 03/11/2022  ? AST 20 03/11/2022  ? ALT 16 03/11/2022  ? ALKPHOS 125 03/11/2022  ? BILITOT 0.6 03/11/2022  ? GFRNONAA 51 (L) 03/11/2022  ? GFRAA >60 07/21/2020  ? ? ?Lab Results  ?Component Value Date  ? CEA1 68.8 (H) 02/23/2021  ? CEA <0.5 10/15/2008  ? MVE720 16,234 (H) 03/04/2022  ? ? ? ?Medications: I have reviewed the patient's current medications. ? ? ?Assessment/Plan: ? ?Pancreas cancer ?-CT abdomen/pelvis without contrast 02/16/2021-multiple large hypoechoic masses in the patient's liver consistent metastatic disease, ill-defined masslike area in the pancreatic head measuring approximate 4.7 cm, possible filling defect in the right  ventricle. ?-EGD 02/19/2021-large infiltrative mass with bleeding in the second portion of the duodenum, appears to be arising near the ampulla, partially obstructive with friable mucosa.  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 ?-Flexible sigmoidoscopy 02/19/2021-diverticulosis in the sigmoid colon, nonbleeding external and internal hemorrhoids ?-Cardiac CT 02/20/2021-mass in the mid RV attached to the interventricular septum measuring 16 mm x 6 mm and concerning for metastatic tumor ?-MRI abdomen with/without contrast 02/21/2021-mass in the posterior pancreatic head measuring 4.3 x 3.8 cm with obstruction of the central pancreatic duct and diffuse dilatation up to 6 mm consistent with pancreatic adenocarcinoma, liver lesions the largest mass of the central left lobe measuring 9.9 x 9.2 cm consistent with hepatic metastatic disease,  hepatomegaly. ?-Ultrasound-guided biopsy of a left liver lesion 02/24/2021-adenocarcinoma, morphologically similar to the duodenal biopsy; microsatellite stable, tumor mutation burden 1 ?-Negative genetic testing ?-Palliative radiation to the duodenal mass 02/25/2021-03/06/2021 ?-Cycle 1 FOLFOX 03/17/2021 ?-Cycle 2 FOLFOX 04/01/2021 ?-Cycle 3 FOLFIRINOX 04/14/2021 ?-Cycle 4 FOLFIRINOX 04/27/2021, Udenyca added ?-Cycle 5 FOLFIRINOX 05/12/2021, Udenyca ?-MRI abdomen 05/23/2021-decrease in size of pancreas mass and hepatic lesions, no progressive disease ?-Cycle 6 FOLFIRINOX 05/27/2021 ?-CT 06/17/2021-multiple liver metastases similar to her prior exam.  Pancreas mass not significantly changed. ?-CT 06/26/2021-stable pancreas mass, no significant change in size and number of known liver metastases. ?-CT abdomen/pelvis 07/12/2021-new gastrostomy tube.  Multiple liver masses slightly decreased in size from prior exam.  Pancreas head mass  unchanged. ?-Cycle 1 gemcitabine/Abraxane 07/30/2021 ?-Cycle 2 gemcitabine/Abraxane 08/13/2021 ?-Cycle 3 gemcitabine 08/27/2021, Abraxane held due  to significant bilateral toe pain, question neuropathy ?-Cycle 4 gemcitabine, Abraxane held due to neuropathy, 09/10/2021 ?-Cycle 5 gemcitabine, Abraxane held due to neuropathy, 09/24/2021 ?-Cycle 6 gemcitabine, Abraxane held due to neuropathy 10/16/2021 ?-CTs 10/29/2021-mild progression of multifocal liver metastases; mild increase in size of hypoenhancing mass within the head of the pancreas; new mild pleural nodularity along the posterior lower lobes ?-Cycle 7 gemcitabine/Abraxane 11/03/2021 ?-Cycle 8 gemcitabine/Abraxane 11/23/2021 ?-Cycle 9 gemcitabine/Abraxane 12/10/2021 ?-Cycle 10 Gemcitabine/Abraxane 12/24/2021 ?-CT abdomen/pelvis 01/04/2022-decrease size of pancreas head mass, no change in multifocal hepatic metastases ?-Cycle 11 gemcitabine/Abraxane 01/07/2022 ?-Cycle 12 gemcitabine/Abraxane 02/04/2022 ?-Cycle 13 Gemcitabine/Abraxane 02/18/2022 ?-Cycle 14 gemcitabine/Abraxane 03/11/2022 ?-Cycle 15 gemcitabine/Abraxane 03/29/2022 ?2.  Iron deficiency anemia-improved ?-Feraheme 510 mg 02/20/2021 ?3.  Protein calorie malnutrition ?4.  Possible right ventricular filling defect on noncontrast CT scan ?MRI cardiac morphology 02/21/2021-mass in the mid RV attached to the interventricular septum measures 16 mm x 6 mm.  Mass appears heterogeneous with area of enhancement on LGE imaging.  Mass is concerning for metastatic tumor.  Normal LV size and systolic function.  Normal RV size and systolic function. ?5.  Hypertension ?6.  History of CVA ?7.  Hyperlipidemia ?8.  Hypothyroidism ?9.  Morbid obesity ?10.  Asthma ?11.  Migraines ?12.  Pain secondary to #1 ?13.  Port-A-Cath placement 02/25/2021 ?14.  Hypokalemia 04/01/2021-started a potassium supplement ?15.  Hospital admission 06/17/2021-intractable nausea and vomiting ?EGD 06/19/2021-diffuse gastritis, nonobstructing duodenal mass, biopsy of stomach-no malignancy or H. pylori, duodenal mass-adenocarcinoma ?16.  Hospital admission 07/12/2021 with nausea/vomiting, abdominal pain, and  hypokalemia ?17.  Percutaneous gastrostomy tube placement 07/01/2021 ? ? ? ? ?Disposition: ?Wendy Hoffman continues treatment with gemcitabine/Abraxane.  Her performance status is improved.  We will follow-up on the CA 19-9 from today.  She will complete another cycle of gemcitabine/Abraxane today.  She will return for an office visit and chemotherapy in 2 weeks.  She will be scheduled for a restaging CT on 04/19/2022. ? ?She will complete a course of Diflucan for oral candidiasis. ? ?Betsy Coder, MD ? ?03/29/2022  ?11:05 AM ? ? ?

## 2022-03-29 NOTE — Progress Notes (Signed)
Patient seen by Dr. Sherrill today ? ?Vitals are within treatment parameters. ? ?Labs reviewed by Dr. Sherrill and are within treatment parameters. ? ?Per physician team, patient is ready for treatment and there are NO modifications to the treatment plan.  ?

## 2022-03-29 NOTE — Progress Notes (Signed)
Patient presents for treatment. RN assessment completed along with the following: ? ?Labs/vitals reviewed - Yes, and within treatment parameters.   ?Weight within 10% of previous measurement - Yes ?Informed consent completed and reflects current therapy/intent - Yes, on date 07/30/21             ?Provider progress note reviewed - Yes, today's provider note was reviewed. ?Treatment/Antibody/Supportive plan reviewed - Yes, and there are no adjustments needed for today's treatment. ?S&H and other orders reviewed - Yes, and there are no additional orders identified. ?Previous treatment date reviewed - Yes, and the appropriate amount of time has elapsed between treatments. ?Clinic Hand Off Received from - Cristy Friedlander, RN ? ?Patient to proceed with treatment.  ? ?

## 2022-03-30 LAB — CANCER ANTIGEN 19-9: CA 19-9: 16769 U/mL — ABNORMAL HIGH (ref 0–35)

## 2022-03-31 ENCOUNTER — Telehealth: Payer: Self-pay | Admitting: *Deleted

## 2022-03-31 NOTE — Telephone Encounter (Signed)
Ms. Domzalski notified of CT scan at [facility] on 04/19/22 at 0915/0930. NPO after 0530 and oral contrast at 0730 and 0830. ?

## 2022-04-05 ENCOUNTER — Other Ambulatory Visit: Payer: Self-pay | Admitting: Nurse Practitioner

## 2022-04-05 ENCOUNTER — Other Ambulatory Visit: Payer: Self-pay | Admitting: Oncology

## 2022-04-05 DIAGNOSIS — J4540 Moderate persistent asthma, uncomplicated: Secondary | ICD-10-CM

## 2022-04-05 DIAGNOSIS — C259 Malignant neoplasm of pancreas, unspecified: Secondary | ICD-10-CM

## 2022-04-11 ENCOUNTER — Other Ambulatory Visit: Payer: Self-pay | Admitting: Oncology

## 2022-04-12 ENCOUNTER — Inpatient Hospital Stay: Payer: Medicaid Other

## 2022-04-12 ENCOUNTER — Inpatient Hospital Stay: Payer: Medicaid Other | Attending: Nurse Practitioner

## 2022-04-12 ENCOUNTER — Inpatient Hospital Stay: Payer: Medicaid Other | Admitting: Nurse Practitioner

## 2022-04-12 DIAGNOSIS — C250 Malignant neoplasm of head of pancreas: Secondary | ICD-10-CM | POA: Insufficient documentation

## 2022-04-12 DIAGNOSIS — Z5111 Encounter for antineoplastic chemotherapy: Secondary | ICD-10-CM | POA: Insufficient documentation

## 2022-04-12 DIAGNOSIS — C787 Secondary malignant neoplasm of liver and intrahepatic bile duct: Secondary | ICD-10-CM | POA: Insufficient documentation

## 2022-04-16 ENCOUNTER — Inpatient Hospital Stay: Payer: Medicaid Other

## 2022-04-16 ENCOUNTER — Inpatient Hospital Stay (HOSPITAL_BASED_OUTPATIENT_CLINIC_OR_DEPARTMENT_OTHER): Payer: Medicaid Other | Admitting: Nurse Practitioner

## 2022-04-16 ENCOUNTER — Encounter: Payer: Self-pay | Admitting: *Deleted

## 2022-04-16 ENCOUNTER — Encounter: Payer: Self-pay | Admitting: Nurse Practitioner

## 2022-04-16 VITALS — BP 102/66 | HR 95 | Temp 99.1°F | Resp 16 | Wt 193.4 lb

## 2022-04-16 DIAGNOSIS — C250 Malignant neoplasm of head of pancreas: Secondary | ICD-10-CM | POA: Diagnosis not present

## 2022-04-16 DIAGNOSIS — Z5111 Encounter for antineoplastic chemotherapy: Secondary | ICD-10-CM | POA: Diagnosis present

## 2022-04-16 DIAGNOSIS — C787 Secondary malignant neoplasm of liver and intrahepatic bile duct: Secondary | ICD-10-CM | POA: Diagnosis not present

## 2022-04-16 DIAGNOSIS — C259 Malignant neoplasm of pancreas, unspecified: Secondary | ICD-10-CM | POA: Diagnosis not present

## 2022-04-16 LAB — CBC WITH DIFFERENTIAL (CANCER CENTER ONLY)
Abs Immature Granulocytes: 0.02 10*3/uL (ref 0.00–0.07)
Basophils Absolute: 0 10*3/uL (ref 0.0–0.1)
Basophils Relative: 0 %
Eosinophils Absolute: 0.2 10*3/uL (ref 0.0–0.5)
Eosinophils Relative: 3 %
HCT: 28.6 % — ABNORMAL LOW (ref 36.0–46.0)
Hemoglobin: 9.1 g/dL — ABNORMAL LOW (ref 12.0–15.0)
Immature Granulocytes: 0 %
Lymphocytes Relative: 13 %
Lymphs Abs: 0.9 10*3/uL (ref 0.7–4.0)
MCH: 29.5 pg (ref 26.0–34.0)
MCHC: 31.8 g/dL (ref 30.0–36.0)
MCV: 92.9 fL (ref 80.0–100.0)
Monocytes Absolute: 0.7 10*3/uL (ref 0.1–1.0)
Monocytes Relative: 11 %
Neutro Abs: 5.1 10*3/uL (ref 1.7–7.7)
Neutrophils Relative %: 73 %
Platelet Count: 244 10*3/uL (ref 150–400)
RBC: 3.08 MIL/uL — ABNORMAL LOW (ref 3.87–5.11)
RDW: 16.6 % — ABNORMAL HIGH (ref 11.5–15.5)
WBC Count: 6.9 10*3/uL (ref 4.0–10.5)
nRBC: 0 % (ref 0.0–0.2)

## 2022-04-16 LAB — CMP (CANCER CENTER ONLY)
ALT: 20 U/L (ref 0–44)
AST: 30 U/L (ref 15–41)
Albumin: 3.6 g/dL (ref 3.5–5.0)
Alkaline Phosphatase: 113 U/L (ref 38–126)
Anion gap: 9 (ref 5–15)
BUN: 21 mg/dL — ABNORMAL HIGH (ref 6–20)
CO2: 26 mmol/L (ref 22–32)
Calcium: 8.9 mg/dL (ref 8.9–10.3)
Chloride: 102 mmol/L (ref 98–111)
Creatinine: 1.01 mg/dL — ABNORMAL HIGH (ref 0.44–1.00)
GFR, Estimated: >60 mL/min (ref >60)
Glucose, Bld: 139 mg/dL — ABNORMAL HIGH (ref 70–99)
Potassium: 3.3 mmol/L — ABNORMAL LOW (ref 3.5–5.1)
Sodium: 137 mmol/L (ref 135–145)
Total Bilirubin: 0.5 mg/dL (ref 0.3–1.2)
Total Protein: 6.4 g/dL — ABNORMAL LOW (ref 6.5–8.1)

## 2022-04-16 MED ORDER — SODIUM CHLORIDE 0.9 % IV SOLN: Freq: Once | INTRAVENOUS | Status: AC

## 2022-04-16 MED ORDER — PACLITAXEL PROTEIN-BOUND CHEMO INJECTION 100 MG
100.0000 mg/m2 | Freq: Once | INTRAVENOUS | Status: AC
  Administered 2022-04-16: 200 mg via INTRAVENOUS
  Filled 2022-04-16: qty 40

## 2022-04-16 MED ORDER — HEPARIN SOD (PORK) LOCK FLUSH 100 UNIT/ML IV SOLN
500.0000 [IU] | Freq: Once | INTRAVENOUS | Status: AC | PRN
  Administered 2022-04-16: 500 [IU]

## 2022-04-16 MED ORDER — MORPHINE SULFATE ER 60 MG PO TBCR: 60.0000 mg | EXTENDED_RELEASE_TABLET | Freq: Two times a day (BID) | ORAL | 0 refills | Status: DC

## 2022-04-16 MED ORDER — SODIUM CHLORIDE 0.9 % IV SOLN
1000.0000 mg/m2 | Freq: Once | INTRAVENOUS | Status: AC
  Administered 2022-04-16: 1900 mg via INTRAVENOUS
  Filled 2022-04-16: qty 49.97

## 2022-04-16 MED ORDER — SCOPOLAMINE 1 MG/3DAYS TD PT72: 1.0000 | MEDICATED_PATCH | TRANSDERMAL | 3 refills | Status: DC

## 2022-04-16 MED ORDER — SODIUM CHLORIDE 0.9% FLUSH
10.0000 mL | INTRAVENOUS | Status: DC | PRN
  Administered 2022-04-16: 10 mL

## 2022-04-16 MED ORDER — PROCHLORPERAZINE MALEATE 10 MG PO TABS
10.0000 mg | ORAL_TABLET | Freq: Once | ORAL | Status: AC
  Administered 2022-04-16: 10 mg via ORAL
  Filled 2022-04-16: qty 1

## 2022-04-16 NOTE — Progress Notes (Signed)
Patient presents for treatment. RN assessment completed along with the following: ? ?Labs/vitals reviewed - Yes, and within treatment parameters.   ?Weight within 10% of previous measurement - Yes ?Informed consent completed and reflects current therapy/intent - Yes, on date 07/30/21             ?Provider progress note reviewed - Yes, today's provider note was reviewed. ?Treatment/Antibody/Supportive plan reviewed - Yes, and there are no adjustments needed for today's treatment. ?S&H and other orders reviewed - Yes, and there are no additional orders identified. ?Previous treatment date reviewed - Yes, and the appropriate amount of time has elapsed between treatments. ?Clinic Hand Off Received from - Cristy Friedlander, RN ? ?Patient to proceed with treatment.  ? ?

## 2022-04-16 NOTE — Progress Notes (Signed)
Patient seen by Lisa Thomas NP today  Vitals are within treatment parameters.  Labs reviewed by Lisa Thomas NP and are within treatment parameters.  Per physician team, patient is ready for treatment and there are NO modifications to the treatment plan.     

## 2022-04-16 NOTE — Progress Notes (Signed)
?[place] [facility] ?OFFICE PROGRESS NOTE ? ? ?Diagnosis: Pancreas cancer ? ?INTERVAL HISTORY:  ? ?Wendy Hoffman returns for follow-up.  She completed another cycle of gemcitabine/Abraxane 03/29/2022.  She has intermittent nausea.  No vomiting.  No mouth sores.  Bowels are moving.  No diarrhea.  No fever or rash.  Stable numbness/tingling in the toes.  She describes pain as "manageable". ? ?Objective: ? ?Vital signs in last 24 hours: ? ?Blood pressure 102/66, pulse 95, temperature 99.1 ?F (37.3 ?C), temperature source Oral, resp. rate 16, weight 193 lb 6.4 oz (87.7 kg), SpO2 98 %. ?  ? ?HEENT: No thrush or ulcers. ?Resp: Lungs clear bilaterally. ?Cardio: Regular rate and rhythm. ?GI: Abdomen is soft with mild generalized tenderness.  No hepatosplenomegaly.  No mass. ?Vascular: No leg edema. ?Skin: No rash. ?Port-A-Cath without erythema ? ? ?Lab Results: ? ?Lab Results  ?Component Value Date  ? WBC 6.9 04/16/2022  ? HGB 9.1 (L) 04/16/2022  ? HCT 28.6 (L) 04/16/2022  ? MCV 92.9 04/16/2022  ? PLT 244 04/16/2022  ? NEUTROABS 5.1 04/16/2022  ? ? ?Imaging: ? ?No results found. ? ?Medications: I have reviewed the patient's current medications. ? ?Assessment/Plan: ?Pancreas cancer ?-CT abdomen/pelvis without contrast 02/16/2021-multiple large hypoechoic masses in the patient's liver consistent metastatic disease, ill-defined masslike area in the pancreatic head measuring approximate 4.7 cm, possible filling defect in the right ventricle. ?-EGD 02/19/2021-large infiltrative mass with bleeding in the second portion of the duodenum, appears to be arising near the ampulla, partially obstructive with friable mucosa.  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 ?-Flexible sigmoidoscopy 02/19/2021-diverticulosis in the sigmoid colon, nonbleeding external and internal hemorrhoids ?-Cardiac CT 02/20/2021-mass in the mid RV attached to the interventricular septum measuring 16 mm x 6 mm and concerning for  metastatic tumor ?-MRI abdomen with/without contrast 02/21/2021-mass in the posterior pancreatic head measuring 4.3 x 3.8 cm with obstruction of the central pancreatic duct and diffuse dilatation up to 6 mm consistent with pancreatic adenocarcinoma, liver lesions the largest mass of the central left lobe measuring 9.9 x 9.2 cm consistent with hepatic metastatic disease,  hepatomegaly. ?-Ultrasound-guided biopsy of a left liver lesion 02/24/2021-adenocarcinoma, morphologically similar to the duodenal biopsy; microsatellite stable, tumor mutation burden 1 ?-Negative genetic testing ?-Palliative radiation to the duodenal mass 02/25/2021-03/06/2021 ?-Cycle 1 FOLFOX 03/17/2021 ?-Cycle 2 FOLFOX 04/01/2021 ?-Cycle 3 FOLFIRINOX 04/14/2021 ?-Cycle 4 FOLFIRINOX 04/27/2021, Udenyca added ?-Cycle 5 FOLFIRINOX 05/12/2021, Udenyca ?-MRI abdomen 05/23/2021-decrease in size of pancreas mass and hepatic lesions, no progressive disease ?-Cycle 6 FOLFIRINOX 05/27/2021 ?-CT 06/17/2021-multiple liver metastases similar to her prior exam.  Pancreas mass not significantly changed. ?-CT 06/26/2021-stable pancreas mass, no significant change in size and number of known liver metastases. ?-CT abdomen/pelvis 07/12/2021-new gastrostomy tube.  Multiple liver masses slightly decreased in size from prior exam.  Pancreas head mass unchanged. ?-Cycle 1 gemcitabine/Abraxane 07/30/2021 ?-Cycle 2 gemcitabine/Abraxane 08/13/2021 ?-Cycle 3 gemcitabine 08/27/2021, Abraxane held due to significant bilateral toe pain, question neuropathy ?-Cycle 4 gemcitabine, Abraxane held due to neuropathy, 09/10/2021 ?-Cycle 5 gemcitabine, Abraxane held due to neuropathy, 09/24/2021 ?-Cycle 6 gemcitabine, Abraxane held due to neuropathy 10/16/2021 ?-CTs 10/29/2021-mild progression of multifocal liver metastases; mild increase in size of hypoenhancing mass within the head of the pancreas; new mild pleural nodularity along the posterior lower lobes ?-Cycle 7 gemcitabine/Abraxane  11/03/2021 ?-Cycle 8 gemcitabine/Abraxane 11/23/2021 ?-Cycle 9 gemcitabine/Abraxane 12/10/2021 ?-Cycle 10 Gemcitabine/Abraxane 12/24/2021 ?-CT abdomen/pelvis 01/04/2022-decrease size of pancreas head mass, no change in multifocal hepatic metastases ?-Cycle 11 gemcitabine/Abraxane 01/07/2022 ?-Cycle  12 gemcitabine/Abraxane 02/04/2022 ?-Cycle 13 Gemcitabine/Abraxane 02/18/2022 ?-Cycle 14 gemcitabine/Abraxane 03/11/2022 ?-Cycle 15 gemcitabine/Abraxane 03/29/2022 ?-Cycle 16 gemcitabine/Abraxane 04/16/2022 ?2.  Iron deficiency anemia-improved ?-Feraheme 510 mg 02/20/2021 ?3.  Protein calorie malnutrition ?4.  Possible right ventricular filling defect on noncontrast CT scan ?MRI cardiac morphology 02/21/2021-mass in the mid RV attached to the interventricular septum measures 16 mm x 6 mm.  Mass appears heterogeneous with area of enhancement on LGE imaging.  Mass is concerning for metastatic tumor.  Normal LV size and systolic function.  Normal RV size and systolic function. ?5.  Hypertension ?6.  History of CVA ?7.  Hyperlipidemia ?8.  Hypothyroidism ?9.  Morbid obesity ?10.  Asthma ?11.  Migraines ?12.  Pain secondary to #1 ?13.  Port-A-Cath placement 02/25/2021 ?14.  Hypokalemia 04/01/2021-started a potassium supplement ?15.  Hospital admission 06/17/2021-intractable nausea and vomiting ?EGD 06/19/2021-diffuse gastritis, nonobstructing duodenal mass, biopsy of stomach-no malignancy or H. pylori, duodenal mass-adenocarcinoma ?16.  Hospital admission 07/12/2021 with nausea/vomiting, abdominal pain, and hypokalemia ?17.  Percutaneous gastrostomy tube placement 07/01/2021 ?  ? ?Disposition: Wendy Hoffman appears unchanged.  There is no clinical evidence of disease progression.  Plan to proceed with gemcitabine/Abraxane today as scheduled.  She has restaging CT scheduled next week. ? ?CBC from today reviewed.  Counts adequate to proceed with treatment. ? ?She will return for lab, follow-up, Gemcitabine/Abraxane in 2 weeks.  We are available to  see her sooner if needed. ? ?MS Contin refilled today. ? ? ? ?[name]  ? ?04/16/2022  ?11:10 AM ? ? ? ? ? ? ? ?

## 2022-04-16 NOTE — Patient Instructions (Signed)
[facility]   ?Discharge Instructions: ?Thank you for choosing [facility] to provide your oncology and hematology care.  ? ?If you have a lab appointment with the [facility], please go directly to the [facility] and check in at the registration area. ?  ?Wear comfortable clothing and clothing appropriate for easy access to any Portacath or PICC line.  ? ?We strive to give you quality time with your provider. You may need to reschedule your appointment if you arrive late (15 or more minutes).  Arriving late affects you and other patients whose appointments are after yours.  Also, if you miss three or more appointments without notifying the office, you may be dismissed from the clinic at the provider?s discretion.    ?  ?For prescription refill requests, have your pharmacy contact our office and allow 72 hours for refills to be completed.   ? ?Today you received the following chemotherapy and/or immunotherapy agents Abraxane, Gemzar    ?  ?To help prevent nausea and vomiting after your treatment, we encourage you to take your nausea medication as directed. ? ?BELOW ARE SYMPTOMS THAT SHOULD BE REPORTED IMMEDIATELY: ?*FEVER GREATER THAN 100.4 F (38 ?C) OR HIGHER ?*CHILLS OR SWEATING ?*NAUSEA AND VOMITING THAT IS NOT CONTROLLED WITH YOUR NAUSEA MEDICATION ?*UNUSUAL SHORTNESS OF BREATH ?*UNUSUAL BRUISING OR BLEEDING ?*URINARY PROBLEMS (pain or burning when urinating, or frequent urination) ?*BOWEL PROBLEMS (unusual diarrhea, constipation, pain near the anus) ?TENDERNESS IN MOUTH AND THROAT WITH OR WITHOUT PRESENCE OF ULCERS (sore throat, sores in mouth, or a toothache) ?UNUSUAL RASH, SWELLING OR PAIN  ?UNUSUAL VAGINAL DISCHARGE OR ITCHING  ? ?Items with * indicate a potential emergency and should be followed up as soon as possible or go to the Emergency Department if any problems should occur. ? ?Please show the CHEMOTHERAPY ALERT CARD or IMMUNOTHERAPY ALERT CARD at  check-in to the Emergency Department and triage nurse. ? ?Should you have questions after your visit or need to cancel or reschedule your appointment, please contact [facility]  Dept: [phone]  and follow the prompts.  Office hours are 8:00 a.m. to 4:30 p.m. Monday - Friday. Please note that voicemails left after 4:00 p.m. may not be returned until the following business day.  We are closed weekends and major holidays. You have access to a nurse at all times for urgent questions. Please call the main number to the clinic Dept: [phone] and follow the prompts. ? ? ?For any non-urgent questions, you may also contact your provider using MyChart. We now offer e-Visits for anyone 15 and older to request care online for non-urgent symptoms. For details visit [email or web address]. ?  ?Also download the MyChart app! Go to the app store, search "MyChart", open the app, select [facility], and log in with your MyChart username and password. ? ?Due to Covid, a mask is required upon entering the hospital/clinic. If you do not have a mask, one will be given to you upon arrival. For doctor visits, patients may have 1 support person aged 22 or older with them. For treatment visits, patients cannot have anyone with them due to current Covid guidelines and our immunocompromised population.  ? ?Nanoparticle Albumin-Bound Paclitaxel injection ?What is this medication? ?NANOPARTICLE ALBUMIN-BOUND PACLITAXEL (Na no PAHR ti kuhl  al BYOO muhn-bound  PAK li TAX el) is a chemotherapy drug. It targets fast dividing cells, like cancer cells, and causes these cells to die. This medicine is used  to treat advanced breast cancer, lung cancer, and pancreatic cancer. ?This medicine may be used for other purposes; ask your health care provider or pharmacist if you have questions. ?COMMON BRAND NAME(S): Abraxane ?What should I tell my care team before I take this medication? ?They need to know if you have any  of these conditions: ?kidney disease ?liver disease ?low blood counts, like low white cell, platelet, or red cell counts ?lung or breathing disease, like asthma ?tingling of the fingers or toes, or other nerve disorder ?an unusual or allergic reaction to paclitaxel, albumin, other chemotherapy, other medicines, foods, dyes, or preservatives ?pregnant or trying to get pregnant ?breast-feeding ?How should I use this medication? ?This drug is given as an infusion into a vein. It is administered in a hospital or clinic by a specially trained health care professional. ?Talk to your pediatrician regarding the use of this medicine in children. Special care may be needed. ?Overdosage: If you think you have taken too much of this medicine contact a poison control center or emergency room at once. ?NOTE: This medicine is only for you. Do not share this medicine with others. ?What if I miss a dose? ?It is important not to miss your dose. Call your doctor or health care professional if you are unable to keep an appointment. ?What may interact with this medication? ?This medicine may interact with the following medications: ?antiviral medicines for hepatitis, HIV or AIDS ?certain antibiotics like erythromycin and clarithromycin ?certain medicines for fungal infections like ketoconazole and itraconazole ?certain medicines for seizures like carbamazepine, phenobarbital, phenytoin ?gemfibrozil ?nefazodone ?rifampin ?St. John's wort ?This list may not describe all possible interactions. Give your health care provider a list of all the medicines, herbs, non-prescription drugs, or dietary supplements you use. Also tell them if you smoke, drink alcohol, or use illegal drugs. Some items may interact with your medicine. ?What should I watch for while using this medication? ?Your condition will be monitored carefully while you are receiving this medicine. You will need important blood work done while you are taking this medicine. ?This  medicine can cause serious allergic reactions. If you experience allergic reactions like skin rash, itching or hives, swelling of the face, lips, or tongue, tell your doctor or health care professional right away. ?In some cases, you may be given additional medicines to help with side effects. Follow all directions for their use. ?This drug may make you feel generally unwell. This is not uncommon, as chemotherapy can affect healthy cells as well as cancer cells. Report any side effects. Continue your course of treatment even though you feel ill unless your doctor tells you to stop. ?Call your doctor or health care professional for advice if you get a fever, chills or sore throat, or other symptoms of a cold or flu. Do not treat yourself. This drug decreases your body's ability to fight infections. Try to avoid being around people who are sick. ?This medicine may increase your risk to bruise or bleed. Call your doctor or health care professional if you notice any unusual bleeding. ?Be careful brushing and flossing your teeth or using a toothpick because you may get an infection or bleed more easily. If you have any dental work done, tell your dentist you are receiving this medicine. ?Avoid taking products that contain aspirin, acetaminophen, ibuprofen, naproxen, or ketoprofen unless instructed by your doctor. These medicines may hide a fever. ?Do not become pregnant while taking this medicine or for 6 months after stopping it. Women should  inform their doctor if they wish to become pregnant or think they might be pregnant. Men should not father a child while taking this medicine or for 3 months after stopping it. There is a potential for serious side effects to an unborn child. Talk to your health care professional or pharmacist for more information. ?Do not breast-feed an infant while taking this medicine or for 2 weeks after stopping it. ?This medicine may interfere with the ability to get pregnant or to father a  child. You should talk to your doctor or health care professional if you are concerned about your fertility. ?What side effects may I notice from receiving this medication? ?Side effects that you should report t

## 2022-04-17 LAB — CANCER ANTIGEN 19-9: CA 19-9: 19002 U/mL — ABNORMAL HIGH (ref 0–35)

## 2022-04-19 ENCOUNTER — Ambulatory Visit (HOSPITAL_BASED_OUTPATIENT_CLINIC_OR_DEPARTMENT_OTHER): Payer: Medicaid Other

## 2022-04-19 ENCOUNTER — Other Ambulatory Visit: Payer: Self-pay

## 2022-04-19 MED ORDER — FLUCONAZOLE 100 MG PO TABS: ORAL_TABLET | ORAL | 1 refills | Status: DC

## 2022-04-22 ENCOUNTER — Ambulatory Visit (HOSPITAL_BASED_OUTPATIENT_CLINIC_OR_DEPARTMENT_OTHER)
Admission: RE | Admit: 2022-04-22 | Discharge: 2022-04-22 | Disposition: A | Discharge status: Home / Self Care | Payer: Medicaid Other | Source: Ambulatory Visit | Attending: Oncology | Admitting: Oncology

## 2022-04-22 DIAGNOSIS — C250 Malignant neoplasm of head of pancreas: Secondary | ICD-10-CM | POA: Diagnosis not present

## 2022-04-22 MED ORDER — IOHEXOL 300 MG/ML  SOLN
80.0000 mL | Freq: Once | INTRAMUSCULAR | Status: AC | PRN
Start: 2022-04-22 — End: 2022-04-22
  Administered 2022-04-22: 80 mL via INTRAVENOUS

## 2022-04-22 MED ORDER — HEPARIN SOD (PORK) LOCK FLUSH 100 UNIT/ML IV SOLN
500.0000 [IU] | Freq: Once | INTRAVENOUS | Status: AC
  Administered 2022-04-22: 500 [IU] via INTRAVENOUS

## 2022-04-22 MED ORDER — SODIUM CHLORIDE 0.9% FLUSH
10.0000 mL | INTRAVENOUS | Status: DC | PRN
  Filled 2022-04-22: qty 10

## 2022-04-25 ENCOUNTER — Other Ambulatory Visit: Payer: Self-pay | Admitting: Oncology

## 2022-04-26 ENCOUNTER — Inpatient Hospital Stay: Payer: Medicaid Other

## 2022-04-26 ENCOUNTER — Inpatient Hospital Stay: Payer: Medicaid Other | Admitting: Nurse Practitioner

## 2022-04-27 ENCOUNTER — Other Ambulatory Visit: Payer: Self-pay

## 2022-04-27 ENCOUNTER — Emergency Department (HOSPITAL_BASED_OUTPATIENT_CLINIC_OR_DEPARTMENT_OTHER)
Admission: EM | Admit: 2022-04-27 | Discharge: 2022-04-27 | Disposition: A | Discharge status: Home / Self Care | Payer: Medicaid Other | Attending: Emergency Medicine | Admitting: Emergency Medicine

## 2022-04-27 ENCOUNTER — Encounter (HOSPITAL_BASED_OUTPATIENT_CLINIC_OR_DEPARTMENT_OTHER): Payer: Self-pay | Admitting: Emergency Medicine

## 2022-04-27 ENCOUNTER — Emergency Department (HOSPITAL_BASED_OUTPATIENT_CLINIC_OR_DEPARTMENT_OTHER): Payer: Medicaid Other

## 2022-04-27 DIAGNOSIS — Z7951 Long term (current) use of inhaled steroids: Secondary | ICD-10-CM | POA: Insufficient documentation

## 2022-04-27 DIAGNOSIS — Z8507 Personal history of malignant neoplasm of pancreas: Secondary | ICD-10-CM | POA: Diagnosis not present

## 2022-04-27 DIAGNOSIS — Z8541 Personal history of malignant neoplasm of cervix uteri: Secondary | ICD-10-CM | POA: Diagnosis not present

## 2022-04-27 DIAGNOSIS — E039 Hypothyroidism, unspecified: Secondary | ICD-10-CM | POA: Insufficient documentation

## 2022-04-27 DIAGNOSIS — W19XXXA Unspecified fall, initial encounter: Secondary | ICD-10-CM | POA: Diagnosis not present

## 2022-04-27 DIAGNOSIS — I10 Essential (primary) hypertension: Secondary | ICD-10-CM | POA: Diagnosis not present

## 2022-04-27 DIAGNOSIS — Z87891 Personal history of nicotine dependence: Secondary | ICD-10-CM | POA: Insufficient documentation

## 2022-04-27 DIAGNOSIS — S8992XA Unspecified injury of left lower leg, initial encounter: Secondary | ICD-10-CM | POA: Insufficient documentation

## 2022-04-27 DIAGNOSIS — S8991XA Unspecified injury of right lower leg, initial encounter: Secondary | ICD-10-CM | POA: Diagnosis present

## 2022-04-27 DIAGNOSIS — S99922A Unspecified injury of left foot, initial encounter: Secondary | ICD-10-CM | POA: Diagnosis not present

## 2022-04-27 DIAGNOSIS — J45909 Unspecified asthma, uncomplicated: Secondary | ICD-10-CM | POA: Insufficient documentation

## 2022-04-27 MED ORDER — OXYCODONE HCL 5 MG PO TABS: 5.0000 mg | ORAL_TABLET | ORAL | 0 refills | Status: DC | PRN

## 2022-04-27 MED ORDER — HYDROCODONE-ACETAMINOPHEN 5-325 MG PO TABS
1.0000 | ORAL_TABLET | Freq: Once | ORAL | Status: AC
Start: 2022-04-27 — End: 2022-04-27
  Administered 2022-04-27: 1 via ORAL
  Filled 2022-04-27: qty 1

## 2022-04-27 NOTE — ED Triage Notes (Signed)
Pt fell x 24 hours ago. Pt states legs just gave out. Pt c/o bilateral knee pain bilaterally, worse on left. Also c/o left foot pain.  ?

## 2022-04-27 NOTE — ED Provider Notes (Signed)
? ?[facility] ?Provider Note: [name], MD, Kyle ? ?CSN: [id number] ?MRN: [id number] ?ARRIVAL: 04/27/22 at [place] ?ROOM: [facility]12/[facility]12 ? ? ?CHIEF COMPLAINT  ?Fall ? ? ?HISTORY OF PRESENT ILLNESS  ?04/27/22 4:17 AM ?Wendy Hoffman is a 56 y.o. female with multiple medical problems.  She states her legs gave out yesterday morning and she fell to the ground.  She is having pain in her knees bilaterally, left greater than right.  She is also having left foot pain.  She rates her pain as a 10 out of 10.  There is associated swelling especially of the left knee.  She is barely able to bear weight on her left leg. ? ? ?Past Medical History:  ?Diagnosis Date  ? Anemia   ? Anxiety   ? ARF (acute renal failure) ([place]) 06/18/2021  ? Arthritis   ? Asthma   ? Cervical ca [facility])   ? H/O: hysterectomy   ? High cholesterol   ? Hypertension   ? Hypothyroidism   ? Insomnia   ? Medical history non-contributory   ? Migraine   ? Morbid obesity ([place])   ? Pancreatic cancer ([place]) 02/2021  ? Shortness of breath   ? Stroke [facility])   ? Thyroid disease   ? ? ?Past Surgical History:  ?Procedure Laterality Date  ? ABDOMINAL HYSTERECTOMY    ? BIOPSY  02/19/2021  ? Procedure: BIOPSY;  Surgeon: Mauri Pole, MD;  Location: [facility] ENDOSCOPY;  Service: Endoscopy;;  ? BIOPSY  06/19/2021  ? Procedure: BIOPSY;  Surgeon: Otis Brace, MD;  Location: [facility] ENDOSCOPY;  Service: Gastroenterology;;  ? CESAREAN SECTION    ? 3 occasions  ? CHOLECYSTECTOMY    ? ESOPHAGOGASTRODUODENOSCOPY (EGD) WITH PROPOFOL N/A 02/19/2021  ? Procedure: ESOPHAGOGASTRODUODENOSCOPY (EGD) WITH PROPOFOL;  Surgeon: Mauri Pole, MD;  Location: [facility] ENDOS[facility];  Service: Endoscopy;  Laterality: N/A;  ? ESOPHAGOGASTRODUODENOSCOPY (EGD) WITH PROPOFOL N/A 06/19/2021  ? Procedure: ESOPHAGOGASTRODUODENOSCOPY (EGD) WITH PROPOFOL;  Surgeon: Otis Brace, MD;  Location: [facility] ENDOS[facility];  Service: Gastroenterology;  Laterality: N/A;  ? FLEXIBLE SIGMOIDOSCOPY N/A 02/19/2021  ?  Procedure: FLEXIBLE SIGMOIDOSCOPY;  Surgeon: Mauri Pole, MD;  Location: [facility];  Service: Endoscopy;  Laterality: N/A;  ? IR GASTROSTOMY TUBE MOD SED  07/01/2021  ? IR GASTROSTOMY TUBE REMOVAL  08/21/2021  ? IR IMAGING GUIDED PORT INSERTION  02/24/2021  ? IR US GUIDE BX ASP/DRAIN  02/24/2021  ? THYROIDECTOMY, PARTIAL    ? Goiter  ? ? ?Family History  ?Problem Relation Age of Onset  ? Migraines Sister   ? Non-Hodgkin's lymphoma Other   ?     maternal half brother's grandson  ? Non-Hodgkin's lymphoma Half-Brother   ?     maternal half brother; dx 69s or 23s  ? Pancreatic cancer Other 60  ?     MGF's niece  ? ? ?Social History  ? ?Tobacco Use  ? Smoking status: Former  ?  Types: Cigarettes  ? Smokeless tobacco: Never  ?Vaping Use  ? Vaping Use: Never used  ?Substance Use Topics  ? Alcohol use: No  ? Drug use: No  ? ? ?Prior to Admission medications   ?Medication Sig Start Date End Date Taking? Authorizing Provider  ?oxyCODONE (ROXICODONE) 5 MG immediate release tablet Take 1 tablet (5 mg total) by mouth every 4 (four) hours as needed for breakthrough pain (cancer patient)). 04/27/22  Yes Famous Eisenhardt, MD  ?albuterol (VENTOLIN HFA) 108 (90 Base) MCG/ACT inhaler INHALE 1 TO 2 PUFFS  BY MOUTH EVERY 6 HOURS AS NEEDED FOR WHEEZING AND FOR SHORTNESS OF BREATH 03/29/22 03/29/23  Ladell Pier, MD  ?bisacodyl (DULCOLAX) 5 MG EC tablet Take 2 tablets (10 mg total) by mouth daily. 07/08/21   [place] Eric J, DO  ?DULoxetine (CYMBALTA) 30 MG capsule TAKE 2 CAPSULES BY MOUTH DAILY 01/25/22   Ladell Pier, MD  ?fluconazole (DIFLUCAN) 100 MG tablet Take 1 Tablet by mouth daily for 5 days. 04/19/22   Ladell Pier, MD  ?gabapentin (NEURONTIN) 300 MG capsule TAKE ONE CAPSULE BY MOUTH NIGHTLY 04/05/22   Ladell Pier, MD  ?lidocaine-prilocaine (EMLA) cream APPLY 1 APPLICATION TOPICALLY TO THE AFFECTED AREA AS NEEDED 09/28/21   Ladell Pier, MD  ?morphine (MS CONTIN) 60 MG 12 hr tablet Take 1 tablet (60 mg total) by  mouth every 12 (twelve) hours. 04/16/22   Owens Shark, NP  ?Morphine Sulfate (MORPHINE CONCENTRATE) 10 MG/0.5ML SOLN concentrated solution Place 0.5-1 mLs (10-20 mg total) under the tongue every 4 (four) hours as needed for severe pain. 03/05/22   Owens Shark, NP  ?ondansetron (ZOFRAN-ODT) 4 MG disintegrating tablet Take 1 tablet (4 mg total) by mouth every 8 (eight) hours as needed for nausea or vomiting. 02/02/22   Evlyn Courier, PA-C  ?pantoprazole (PROTONIX) 40 MG tablet TAKE 1 TABLET(40 MG) BY MOUTH TWICE DAILY 03/01/22   Ladell Pier, MD  ?prochlorperazine (COMPAZINE) 10 MG tablet TAKE 1 TABLET(10 MG) BY MOUTH EVERY 6 HOURS AS NEEDED FOR NAUSEA OR VOMITING 02/02/22   Owens Shark, NP  ?scopolamine (TRANSDERM-SCOP) 1 MG/3DAYS Place 1 patch (1.5 mg total) onto the skin every 3 (three) days. 04/16/22   Owens Shark, NP  ?Dellis Anes 160-4.5 MCG/ACT inhaler INHALE 2 PUFFS INTO THE LUNGS TWICE DAILY 04/05/22   Ladell Pier, MD  ?zolpidem (AMBIEN) 10 MG tablet TAKE 1 TABLET(10 MG) BY MOUTH AT BEDTIME AS NEEDED FOR SLEEP 03/29/22   Owens Shark, NP  ? ? ?Allergies ?Blueberry [vaccinium angustifolium], Mango flavor, Oxaliplatin, Reglan [metoclopramide], Aspirin, and Penicillins ? ? ?REVIEW OF SYSTEMS  ?Negative except as noted here or in the History of Present Illness. ? ? ?PHYSICAL EXAMINATION  ?Initial Vital Signs ?Blood pressure (!) 150/95, pulse 95, temperature 98.1 ?F (36.7 ?C), resp. rate 18, height 5' 4.5" (1.638 m), weight 83.9 kg, SpO2 98 %. ? ?Examination ?General: Well-developed, well-nourished female in no acute distress; appearance consistent with age of record ?HENT: normocephalic; atraumatic ?Eyes: Normal appearance ?Neck: supple ?Heart: regular rate and rhythm ?Lungs: clear to auscultation bilaterally ?Chest: Port-A-Cath right upper quadrant ?Abdomen: soft; nondistended; nontender; bowel sounds present ?Extremities: No deformity; tenderness and mild swelling of left foot; tenderness and swelling  of knees, left greater than right ?Neurologic: Awake, alert and oriented; motor function intact in all extremities and symmetric; no facial droop ?Skin: Warm and dry ?Psychiatric: Normal mood and affect ? ? ?RESULTS  ?Summary of this visit's results, reviewed and interpreted by myself: ? ? EKG Interpretation ? ?Date/Time:    ?Ventricular Rate:    ?PR Interval:    ?QRS Duration:   ?QT Interval:    ?QTC Calculation:   ?R Axis:     ?Text Interpretation:   ?  ? ?  ? ?Laboratory Studies: ?No results found for this or any previous visit (from the past 24 hour(s)). ?Imaging Studies: ?DG Knee Complete 4 Views Left ? ?Result Date: 04/27/2022 ?CLINICAL DATA:  56 year old female status post fall 1 day ago. Continued pain. History  of metastatic pancreatic cancer. EXAM: LEFT KNEE - COMPLETE 4+ VIEW COMPARISON:  Left knee series 06/24/2014. FINDINGS: Progressed and severe medial compartment joint space loss since 2015. Bulky tricompartmental osteophytosis. Moderate to severe patellofemoral compartment joint space loss appears progressed. Chronic calcified loose bodies. Moderate size suprapatellar joint effusion. Patella appears intact. But no acute fracture or dislocation is identified. IMPRESSION: 1. Progressed and severe tricompartmental degenerative changes since 2015. Moderate joint effusion. 2. But no acute fracture or dislocation identified. Electronically Signed   By: Genevie Ann M.D.   On: 04/27/2022 05:34  ? ?DG Knee Complete 4 Views Right ? ?Result Date: 04/27/2022 ?CLINICAL DATA:  56 year old female status post fall 1 day ago. Continued pain. History of metastatic pancreatic cancer. EXAM: RIGHT KNEE - COMPLETE 4+ VIEW COMPARISON:  Right knee series 04/21/2012. FINDINGS: Tricompartmental degenerative spurring has increased since 2013 although with only mild joint space loss throughout the knee. There is a small to moderate suprapatellar joint effusion. Intact patella. Distal medial femoral condyle subchondral lucency  surrounded by sclerosis. But no acute fracture or dislocation identified. IMPRESSION: 1. Small to moderate joint effusion and advanced tricompartmental degenerative changes. 2. Evidence of medial right femoral condyle

## 2022-04-29 ENCOUNTER — Inpatient Hospital Stay: Payer: Medicaid Other

## 2022-04-29 ENCOUNTER — Inpatient Hospital Stay: Payer: Medicaid Other | Admitting: Nurse Practitioner

## 2022-04-30 ENCOUNTER — Telehealth: Payer: Self-pay

## 2022-04-30 ENCOUNTER — Inpatient Hospital Stay: Payer: Medicaid Other

## 2022-04-30 ENCOUNTER — Inpatient Hospital Stay: Payer: Medicaid Other | Admitting: Oncology

## 2022-04-30 NOTE — Telephone Encounter (Signed)
TC to Pt inquiring about missed appointment. V/M message left to return call to reschedule appointment.

## 2022-05-03 ENCOUNTER — Encounter: Payer: Self-pay | Admitting: *Deleted

## 2022-05-03 NOTE — Progress Notes (Signed)
Per Dr. Benay Spice: Need to schedule missed lab/flush/OV and chemo to this week. High priority scheduling message sent.

## 2022-05-05 ENCOUNTER — Telehealth: Payer: Self-pay | Admitting: Oncology

## 2022-05-05 NOTE — Telephone Encounter (Signed)
Reached out to patient in attempts to schedule Lab/flush/OV/TX appointment per in basket message. Patient was not able to come in tomorrow for rescheduling and we did not have any times available for Friday. So advised that we can leave as schedule to return June 5, Patient consented.

## 2022-05-06 ENCOUNTER — Other Ambulatory Visit: Payer: Self-pay | Admitting: Oncology

## 2022-05-06 DIAGNOSIS — J4540 Moderate persistent asthma, uncomplicated: Secondary | ICD-10-CM

## 2022-05-17 ENCOUNTER — Inpatient Hospital Stay: Payer: Medicaid Other

## 2022-05-17 ENCOUNTER — Inpatient Hospital Stay (HOSPITAL_BASED_OUTPATIENT_CLINIC_OR_DEPARTMENT_OTHER): Payer: Medicaid Other | Admitting: Oncology

## 2022-05-17 ENCOUNTER — Inpatient Hospital Stay: Payer: Medicaid Other | Attending: Nurse Practitioner

## 2022-05-17 ENCOUNTER — Encounter: Payer: Self-pay | Admitting: *Deleted

## 2022-05-17 VITALS — BP 110/74 | HR 76 | Temp 98.2°F | Resp 18 | Ht 64.5 in | Wt 180.4 lb

## 2022-05-17 DIAGNOSIS — Z5111 Encounter for antineoplastic chemotherapy: Secondary | ICD-10-CM | POA: Insufficient documentation

## 2022-05-17 DIAGNOSIS — C787 Secondary malignant neoplasm of liver and intrahepatic bile duct: Secondary | ICD-10-CM | POA: Diagnosis not present

## 2022-05-17 DIAGNOSIS — C259 Malignant neoplasm of pancreas, unspecified: Secondary | ICD-10-CM | POA: Diagnosis not present

## 2022-05-17 DIAGNOSIS — C250 Malignant neoplasm of head of pancreas: Secondary | ICD-10-CM | POA: Diagnosis not present

## 2022-05-17 LAB — CMP (CANCER CENTER ONLY)
ALT: 13 U/L (ref 0–44)
AST: 15 U/L (ref 15–41)
Albumin: 3.4 g/dL — ABNORMAL LOW (ref 3.5–5.0)
Alkaline Phosphatase: 168 U/L — ABNORMAL HIGH (ref 38–126)
Anion gap: 11 (ref 5–15)
BUN: 17 mg/dL (ref 6–20)
CO2: 30 mmol/L (ref 22–32)
Calcium: 9.7 mg/dL (ref 8.9–10.3)
Chloride: 98 mmol/L (ref 98–111)
Creatinine: 1.09 mg/dL — ABNORMAL HIGH (ref 0.44–1.00)
GFR, Estimated: >60 mL/min (ref >60)
Glucose, Bld: 105 mg/dL — ABNORMAL HIGH (ref 70–99)
Potassium: 3.8 mmol/L (ref 3.5–5.1)
Sodium: 139 mmol/L (ref 135–145)
Total Bilirubin: 0.5 mg/dL (ref 0.3–1.2)
Total Protein: 7.4 g/dL (ref 6.5–8.1)

## 2022-05-17 LAB — CBC WITH DIFFERENTIAL (CANCER CENTER ONLY)
Abs Immature Granulocytes: 0.04 10*3/uL (ref 0.00–0.07)
Basophils Absolute: 0 10*3/uL (ref 0.0–0.1)
Basophils Relative: 0 %
Eosinophils Absolute: 0.2 10*3/uL (ref 0.0–0.5)
Eosinophils Relative: 5 %
HCT: 29 % — ABNORMAL LOW (ref 36.0–46.0)
Hemoglobin: 9.1 g/dL — ABNORMAL LOW (ref 12.0–15.0)
Immature Granulocytes: 1 %
Lymphocytes Relative: 20 %
Lymphs Abs: 1 10*3/uL (ref 0.7–4.0)
MCH: 28.3 pg (ref 26.0–34.0)
MCHC: 31.4 g/dL (ref 30.0–36.0)
MCV: 90.1 fL (ref 80.0–100.0)
Monocytes Absolute: 0.5 10*3/uL (ref 0.1–1.0)
Monocytes Relative: 11 %
Neutro Abs: 3.2 10*3/uL (ref 1.7–7.7)
Neutrophils Relative %: 63 %
Platelet Count: 350 10*3/uL (ref 150–400)
RBC: 3.22 MIL/uL — ABNORMAL LOW (ref 3.87–5.11)
RDW: 17.5 % — ABNORMAL HIGH (ref 11.5–15.5)
WBC Count: 5.1 10*3/uL (ref 4.0–10.5)
nRBC: 0 % (ref 0.0–0.2)

## 2022-05-17 MED ORDER — MORPHINE SULFATE ER 60 MG PO TBCR: 60.0000 mg | EXTENDED_RELEASE_TABLET | Freq: Two times a day (BID) | ORAL | 0 refills | Status: DC

## 2022-05-17 MED ORDER — SODIUM CHLORIDE 0.9% FLUSH
10.0000 mL | INTRAVENOUS | Status: DC | PRN
  Administered 2022-05-17: 10 mL

## 2022-05-17 MED ORDER — SODIUM CHLORIDE 0.9 % IV SOLN
1000.0000 mg/m2 | Freq: Once | INTRAVENOUS | Status: AC
  Administered 2022-05-17: 1900 mg via INTRAVENOUS
  Filled 2022-05-17: qty 49.97

## 2022-05-17 MED ORDER — SODIUM CHLORIDE 0.9 % IV SOLN: Freq: Once | INTRAVENOUS | Status: AC

## 2022-05-17 MED ORDER — OXYCODONE HCL 5 MG PO TABS: 5.0000 mg | ORAL_TABLET | ORAL | 0 refills | Status: DC | PRN

## 2022-05-17 MED ORDER — PROCHLORPERAZINE MALEATE 10 MG PO TABS
10.0000 mg | ORAL_TABLET | Freq: Once | ORAL | Status: AC
  Administered 2022-05-17: 10 mg via ORAL
  Filled 2022-05-17: qty 1

## 2022-05-17 MED ORDER — HEPARIN SOD (PORK) LOCK FLUSH 100 UNIT/ML IV SOLN
500.0000 [IU] | Freq: Once | INTRAVENOUS | Status: AC | PRN
  Administered 2022-05-17: 500 [IU]

## 2022-05-17 MED ORDER — PACLITAXEL PROTEIN-BOUND CHEMO INJECTION 100 MG
100.0000 mg/m2 | Freq: Once | INTRAVENOUS | Status: AC
  Administered 2022-05-17: 200 mg via INTRAVENOUS
  Filled 2022-05-17: qty 40

## 2022-05-17 MED ORDER — ZOLPIDEM TARTRATE 10 MG PO TABS: ORAL_TABLET | ORAL | 3 refills | Status: DC

## 2022-05-17 NOTE — Progress Notes (Signed)
[place] OFFICE PROGRESS NOTE   Diagnosis: Pancreas cancer  INTERVAL HISTORY:   Wendy Hoffman returns after missing the last scheduled visit here.  She was last treated with gemcitabine/Abraxane on 04/16/2022.  She was seen in the emergency room 04/27/2022 after a fall.  She continues MS Contin, Roxanol, and oxycodone for abdominal pain.  She has intermittent nausea.  She takes up to 4 boost supplements per day.  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  Objective:  Vital signs in last 24 hours:  Blood pressure 110/74, pulse 76, temperature 98.2 F (36.8 C), temperature source Oral, resp. rate 18, height 5' 4.5" (1.638 m), weight 180 lb 6.4 oz (81.8 kg), SpO2 100 %.    HEENT: No thrush or ulcers Resp: Lungs clear bilaterally Cardio: Regular rate and rhythm GI: Tender in the mid and right upper abdomen, no splenomegaly Vascular: No leg edema  Skin: Palms without erythema  Portacath/PICC-without erythema  Lab Results:  Lab Results  Component Value Date   WBC 5.1 05/17/2022   HGB 9.1 (L) 05/17/2022   HCT 29.0 (L) 05/17/2022   MCV 90.1 05/17/2022   PLT 350 05/17/2022   NEUTROABS 3.2 05/17/2022    CMP  Lab Results  Component Value Date   NA 137 04/16/2022   K 3.3 (L) 04/16/2022   CL 102 04/16/2022   CO2 26 04/16/2022   GLUCOSE 139 (H) 04/16/2022   BUN 21 (H) 04/16/2022   CREATININE 1.01 (H) 04/16/2022   CALCIUM 8.9 04/16/2022   PROT 6.4 (L) 04/16/2022   ALBUMIN 3.6 04/16/2022   AST 30 04/16/2022   ALT 20 04/16/2022   ALKPHOS 113 04/16/2022   BILITOT 0.5 04/16/2022   GFRNONAA >60 04/16/2022   GFRAA >60 07/21/2020    Lab Results  Component Value Date   CEA1 68.8 (H) 02/23/2021   CEA <0.5 10/15/2008   KZS010 19,002 (H) 04/16/2022    Medications: I have reviewed the patient's current medications.   Assessment/Plan: Pancreas cancer -CT abdomen/pelvis without contrast 02/16/2021-multiple large hypoechoic masses in the patient's liver consistent  metastatic disease, ill-defined masslike area in the pancreatic head measuring approximate 4.7 cm, possible filling defect in the right ventricle. -EGD 02/19/2021-large infiltrative mass with bleeding in the second portion of the duodenum, appears to be arising near the ampulla, partially obstructive with friable mucosa.  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 -Flexible sigmoidoscopy 02/19/2021-diverticulosis in the sigmoid colon, nonbleeding external and internal hemorrhoids -Cardiac CT 02/20/2021-mass in the mid RV attached to the interventricular septum measuring 16 mm x 6 mm and concerning for metastatic tumor -MRI abdomen with/without contrast 02/21/2021-mass in the posterior pancreatic head measuring 4.3 x 3.8 cm with obstruction of the central pancreatic duct and diffuse dilatation up to 6 mm consistent with pancreatic adenocarcinoma, liver lesions the largest mass of the central left lobe measuring 9.9 x 9.2 cm consistent with hepatic metastatic disease,  hepatomegaly. -Ultrasound-guided biopsy of a left liver lesion 02/24/2021-adenocarcinoma, morphologically similar to the duodenal biopsy; microsatellite stable, tumor mutation burden 1 -Negative genetic testing -Palliative radiation to the duodenal mass 02/25/2021-03/06/2021 -Cycle 1 FOLFOX 03/17/2021 -Cycle 2 FOLFOX 04/01/2021 -Cycle 3 FOLFIRINOX 04/14/2021 -Cycle 4 FOLFIRINOX 04/27/2021, Udenyca added -Cycle 5 FOLFIRINOX 05/12/2021, Udenyca -MRI abdomen 05/23/2021-decrease in size of pancreas mass and hepatic lesions, no progressive disease -Cycle 6 FOLFIRINOX 05/27/2021 -CT 06/17/2021-multiple liver metastases similar to her prior exam.  Pancreas mass not significantly changed. -CT 06/26/2021-stable pancreas mass, no significant change in size and number of known liver metastases. -CT  abdomen/pelvis 07/12/2021-new gastrostomy tube.  Multiple liver masses slightly decreased in size from prior exam.  Pancreas head mass  unchanged. -Cycle 1 gemcitabine/Abraxane 07/30/2021 -Cycle 2 gemcitabine/Abraxane 08/13/2021 -Cycle 3 gemcitabine 08/27/2021, Abraxane held due to significant bilateral toe pain, question neuropathy -Cycle 4 gemcitabine, Abraxane held due to neuropathy, 09/10/2021 -Cycle 5 gemcitabine, Abraxane held due to neuropathy, 09/24/2021 -Cycle 6 gemcitabine, Abraxane held due to neuropathy 10/16/2021 -CTs 10/29/2021-mild progression of multifocal liver metastases; mild increase in size of hypoenhancing mass within the head of the pancreas; new mild pleural nodularity along the posterior lower lobes -Cycle 7 gemcitabine/Abraxane 11/03/2021 -Cycle 8 gemcitabine/Abraxane 11/23/2021 -Cycle 9 gemcitabine/Abraxane 12/10/2021 -Cycle 10 Gemcitabine/Abraxane 12/24/2021 -CT abdomen/pelvis 01/04/2022-decrease size of pancreas head mass, no change in multifocal hepatic metastases -Cycle 11 gemcitabine/Abraxane 01/07/2022 -Cycle 12 gemcitabine/Abraxane 02/04/2022 -Cycle 13 Gemcitabine/Abraxane 02/18/2022 -Cycle 14 gemcitabine/Abraxane 03/11/2022 -Cycle 15 gemcitabine/Abraxane 03/29/2022 -Cycle 16 gemcitabine/Abraxane 04/16/2022 -CT abdomen/pelvis 04/22/2022-mixed response in the liver, some lesions larger, some smaller, no change in pancreas head mass, no evidence of metastatic disease elsewhere in the abdomen or pelvis -Cycle 17 gemcitabine/Abraxane 05/17/2022 2.  Iron deficiency anemia-improved -Feraheme 510 mg 02/20/2021 3.  Protein calorie malnutrition 4.  Possible right ventricular filling defect on noncontrast CT scan MRI cardiac morphology 02/21/2021-mass in the mid RV attached to the interventricular septum measures 16 mm x 6 mm.  Mass appears heterogeneous with area of enhancement on LGE imaging.  Mass is concerning for metastatic tumor.  Normal LV size and systolic function.  Normal RV size and systolic function. 5.  Hypertension 6.  History of CVA 7.  Hyperlipidemia 8.  Hypothyroidism 9.  Morbid obesity 10.   Asthma 11.  Migraines 12.  Pain secondary to #1 13.  Port-A-Cath placement 02/25/2021 14.  Hypokalemia 04/01/2021-started a potassium supplement 15.  Hospital admission 06/17/2021-intractable nausea and vomiting EGD 06/19/2021-diffuse gastritis, nonobstructing duodenal mass, biopsy of stomach-no malignancy or H. pylori, duodenal mass-adenocarcinoma 16.  Hospital admission 07/12/2021 with nausea/vomiting, abdominal pain, and hypokalemia 17.  Percutaneous gastrostomy tube placement 07/01/2021      Disposition: Ms. Tonche has metastatic pancreas cancer.  Her overall status appears unchanged.  I reviewed the CT findings and images with her.  We measured multiple liver lesions.  A few of the lesions appear smaller.  No apparent new lesions.  The CA 19-9 has not changed significantly over several months.  I recommend continuing gemcitabine/Abraxane.  She agrees.  She will complete another treatment with gemcitabine/Abraxane today.  She will return for an office visit and chemotherapy in 2 weeks.  Betsy Coder, MD  05/17/2022  12:16 PM

## 2022-05-17 NOTE — Progress Notes (Unsigned)
Patient seen by Dr. Sherrill today ? ?Vitals are within treatment parameters. ? ?Labs reviewed by Dr. Sherrill and are within treatment parameters. ? ?Per physician team, patient is ready for treatment and there are NO modifications to the treatment plan.  ?

## 2022-05-18 ENCOUNTER — Other Ambulatory Visit: Payer: Self-pay

## 2022-05-18 DIAGNOSIS — C259 Malignant neoplasm of pancreas, unspecified: Secondary | ICD-10-CM

## 2022-05-18 LAB — CANCER ANTIGEN 19-9: CA 19-9: 8475 U/mL — ABNORMAL HIGH (ref 0–35)

## 2022-05-18 MED ORDER — GABAPENTIN 300 MG PO CAPS: ORAL_CAPSULE | ORAL | 0 refills | Status: DC

## 2022-05-21 ENCOUNTER — Encounter (HOSPITAL_COMMUNITY): Payer: Self-pay

## 2022-05-21 NOTE — Progress Notes (Signed)
PA submitted to [employer] via CoverMyMeds for Oxycodone 5 mg. PA approved from 05/18/22-05/19/23.

## 2022-05-30 ENCOUNTER — Other Ambulatory Visit: Payer: Self-pay | Admitting: Oncology

## 2022-06-01 ENCOUNTER — Inpatient Hospital Stay: Payer: Medicaid Other

## 2022-06-01 ENCOUNTER — Encounter: Payer: Self-pay | Admitting: Nurse Practitioner

## 2022-06-01 ENCOUNTER — Other Ambulatory Visit: Payer: Self-pay

## 2022-06-01 ENCOUNTER — Telehealth: Payer: Self-pay

## 2022-06-01 ENCOUNTER — Encounter: Payer: Self-pay | Admitting: *Deleted

## 2022-06-01 ENCOUNTER — Inpatient Hospital Stay (HOSPITAL_BASED_OUTPATIENT_CLINIC_OR_DEPARTMENT_OTHER): Payer: Medicaid Other | Admitting: Nurse Practitioner

## 2022-06-01 VITALS — BP 153/90 | HR 82 | Temp 98.1°F | Resp 16

## 2022-06-01 VITALS — BP 136/87 | HR 60 | Temp 98.1°F | Resp 20 | Ht 64.5 in | Wt 191.4 lb

## 2022-06-01 DIAGNOSIS — Z5111 Encounter for antineoplastic chemotherapy: Secondary | ICD-10-CM | POA: Diagnosis not present

## 2022-06-01 DIAGNOSIS — C250 Malignant neoplasm of head of pancreas: Secondary | ICD-10-CM

## 2022-06-01 LAB — CBC WITH DIFFERENTIAL (CANCER CENTER ONLY)
Abs Immature Granulocytes: 0.02 10*3/uL (ref 0.00–0.07)
Basophils Absolute: 0 10*3/uL (ref 0.0–0.1)
Basophils Relative: 1 %
Eosinophils Absolute: 0.2 10*3/uL (ref 0.0–0.5)
Eosinophils Relative: 6 %
HCT: 27.9 % — ABNORMAL LOW (ref 36.0–46.0)
Hemoglobin: 8.6 g/dL — ABNORMAL LOW (ref 12.0–15.0)
Immature Granulocytes: 1 %
Lymphocytes Relative: 21 %
Lymphs Abs: 0.8 10*3/uL (ref 0.7–4.0)
MCH: 28.4 pg (ref 26.0–34.0)
MCHC: 30.8 g/dL (ref 30.0–36.0)
MCV: 92.1 fL (ref 80.0–100.0)
Monocytes Absolute: 0.4 10*3/uL (ref 0.1–1.0)
Monocytes Relative: 10 %
Neutro Abs: 2.5 10*3/uL (ref 1.7–7.7)
Neutrophils Relative %: 61 %
Platelet Count: 251 10*3/uL (ref 150–400)
RBC: 3.03 MIL/uL — ABNORMAL LOW (ref 3.87–5.11)
RDW: 18 % — ABNORMAL HIGH (ref 11.5–15.5)
WBC Count: 4 10*3/uL (ref 4.0–10.5)
nRBC: 0 % (ref 0.0–0.2)

## 2022-06-01 LAB — CMP (CANCER CENTER ONLY)
ALT: 8 U/L (ref 0–44)
AST: 15 U/L (ref 15–41)
Albumin: 3.5 g/dL (ref 3.5–5.0)
Alkaline Phosphatase: 126 U/L (ref 38–126)
Anion gap: 8 (ref 5–15)
BUN: 12 mg/dL (ref 6–20)
CO2: 29 mmol/L (ref 22–32)
Calcium: 9.5 mg/dL (ref 8.9–10.3)
Chloride: 105 mmol/L (ref 98–111)
Creatinine: 0.99 mg/dL (ref 0.44–1.00)
GFR, Estimated: >60 mL/min (ref >60)
Glucose, Bld: 104 mg/dL — ABNORMAL HIGH (ref 70–99)
Potassium: 4 mmol/L (ref 3.5–5.1)
Sodium: 142 mmol/L (ref 135–145)
Total Bilirubin: 0.4 mg/dL (ref 0.3–1.2)
Total Protein: 6.6 g/dL (ref 6.5–8.1)

## 2022-06-01 MED ORDER — PROCHLORPERAZINE MALEATE 10 MG PO TABS
10.0000 mg | ORAL_TABLET | Freq: Once | ORAL | Status: AC
  Administered 2022-06-01: 10 mg via ORAL
  Filled 2022-06-01: qty 1

## 2022-06-01 MED ORDER — FLUCONAZOLE 100 MG PO TABS: ORAL_TABLET | ORAL | 1 refills | Status: DC

## 2022-06-01 MED ORDER — SODIUM CHLORIDE 0.9 % IV SOLN: Freq: Once | INTRAVENOUS | Status: AC

## 2022-06-01 MED ORDER — HEPARIN SOD (PORK) LOCK FLUSH 100 UNIT/ML IV SOLN
500.0000 [IU] | Freq: Once | INTRAVENOUS | Status: AC | PRN
  Administered 2022-06-01: 500 [IU]

## 2022-06-01 MED ORDER — DULOXETINE HCL 30 MG PO CPEP: ORAL_CAPSULE | ORAL | 3 refills | Status: DC

## 2022-06-01 MED ORDER — PACLITAXEL PROTEIN-BOUND CHEMO INJECTION 100 MG
100.0000 mg/m2 | Freq: Once | INTRAVENOUS | Status: AC
  Administered 2022-06-01: 200 mg via INTRAVENOUS
  Filled 2022-06-01: qty 40

## 2022-06-01 MED ORDER — ONDANSETRON 4 MG PO TBDP: 4.0000 mg | ORAL_TABLET | Freq: Three times a day (TID) | ORAL | 3 refills | Status: DC | PRN

## 2022-06-01 MED ORDER — SODIUM CHLORIDE 0.9% FLUSH
10.0000 mL | INTRAVENOUS | Status: DC | PRN
  Administered 2022-06-01: 10 mL

## 2022-06-01 MED ORDER — SODIUM CHLORIDE 0.9 % IV SOLN
1000.0000 mg/m2 | Freq: Once | INTRAVENOUS | Status: AC
  Administered 2022-06-01: 1900 mg via INTRAVENOUS
  Filled 2022-06-01: qty 49.97

## 2022-06-01 NOTE — Progress Notes (Signed)
[place] OFFICE PROGRESS NOTE   Diagnosis: Pancreas cancer  INTERVAL HISTORY:   Wendy Hoffman returns for follow-up.  She completed another cycle of gemcitabine/Abraxane 05/17/2022.  She has intermittent nausea.  She tends to develop "thrush" the day after treatment.  No diarrhea.  Stable neuropathy symptoms.  Stable pain.  Objective:  Vital signs in last 24 hours:  Blood pressure 136/87, pulse 60, temperature 98.1 F (36.7 C), temperature source Oral, resp. rate 20, height 5' 4.5" (1.638 m), weight 191 lb 6.4 oz (86.8 kg), SpO2 100 %.    HEENT: Mild white coating over tongue. Resp: Lungs clear bilaterally. Cardio: Regular rate and rhythm. GI: No hepatosplenomegaly. Vascular: No leg edema. Port-A-Cath without erythema.  Lab Results:  Lab Results  Component Value Date   WBC 4.0 06/01/2022   HGB 8.6 (L) 06/01/2022   HCT 27.9 (L) 06/01/2022   MCV 92.1 06/01/2022   PLT 251 06/01/2022   NEUTROABS 2.5 06/01/2022    Imaging:  No results found.  Medications: I have reviewed the patient's current medications.  Assessment/Plan: Pancreas cancer -CT abdomen/pelvis without contrast 02/16/2021-multiple large hypoechoic masses in the patient's liver consistent metastatic disease, ill-defined masslike area in the pancreatic head measuring approximate 4.7 cm, possible filling defect in the right ventricle. -EGD 02/19/2021-large infiltrative mass with bleeding in the second portion of the duodenum, appears to be arising near the ampulla, partially obstructive with friable mucosa.  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 -Flexible sigmoidoscopy 02/19/2021-diverticulosis in the sigmoid colon, nonbleeding external and internal hemorrhoids -Cardiac CT 02/20/2021-mass in the mid RV attached to the interventricular septum measuring 16 mm x 6 mm and concerning for metastatic tumor -MRI abdomen with/without contrast 02/21/2021-mass in the posterior  pancreatic head measuring 4.3 x 3.8 cm with obstruction of the central pancreatic duct and diffuse dilatation up to 6 mm consistent with pancreatic adenocarcinoma, liver lesions the largest mass of the central left lobe measuring 9.9 x 9.2 cm consistent with hepatic metastatic disease,  hepatomegaly. -Ultrasound-guided biopsy of a left liver lesion 02/24/2021-adenocarcinoma, morphologically similar to the duodenal biopsy; microsatellite stable, tumor mutation burden 1 -Negative genetic testing -Palliative radiation to the duodenal mass 02/25/2021-03/06/2021 -Cycle 1 FOLFOX 03/17/2021 -Cycle 2 FOLFOX 04/01/2021 -Cycle 3 FOLFIRINOX 04/14/2021 -Cycle 4 FOLFIRINOX 04/27/2021, Udenyca added -Cycle 5 FOLFIRINOX 05/12/2021, Udenyca -MRI abdomen 05/23/2021-decrease in size of pancreas mass and hepatic lesions, no progressive disease -Cycle 6 FOLFIRINOX 05/27/2021 -CT 06/17/2021-multiple liver metastases similar to her prior exam.  Pancreas mass not significantly changed. -CT 06/26/2021-stable pancreas mass, no significant change in size and number of known liver metastases. -CT abdomen/pelvis 07/12/2021-new gastrostomy tube.  Multiple liver masses slightly decreased in size from prior exam.  Pancreas head mass unchanged. -Cycle 1 gemcitabine/Abraxane 07/30/2021 -Cycle 2 gemcitabine/Abraxane 08/13/2021 -Cycle 3 gemcitabine 08/27/2021, Abraxane held due to significant bilateral toe pain, question neuropathy -Cycle 4 gemcitabine, Abraxane held due to neuropathy, 09/10/2021 -Cycle 5 gemcitabine, Abraxane held due to neuropathy, 09/24/2021 -Cycle 6 gemcitabine, Abraxane held due to neuropathy 10/16/2021 -CTs 10/29/2021-mild progression of multifocal liver metastases; mild increase in size of hypoenhancing mass within the head of the pancreas; new mild pleural nodularity along the posterior lower lobes -Cycle 7 gemcitabine/Abraxane 11/03/2021 -Cycle 8 gemcitabine/Abraxane 11/23/2021 -Cycle 9 gemcitabine/Abraxane  12/10/2021 -Cycle 10 Gemcitabine/Abraxane 12/24/2021 -CT abdomen/pelvis 01/04/2022-decrease size of pancreas head mass, no change in multifocal hepatic metastases -Cycle 11 gemcitabine/Abraxane 01/07/2022 -Cycle 12 gemcitabine/Abraxane 02/04/2022 -Cycle 13 Gemcitabine/Abraxane 02/18/2022 -Cycle 14 gemcitabine/Abraxane 03/11/2022 -Cycle 15 gemcitabine/Abraxane 03/29/2022 -Cycle 16 gemcitabine/Abraxane 04/16/2022 -CT  abdomen/pelvis 04/22/2022-mixed response in the liver, some lesions larger, some smaller, no change in pancreas head mass, no evidence of metastatic disease elsewhere in the abdomen or pelvis -Cycle 17 gemcitabine/Abraxane 05/17/2022 -Cycle 18 Gemcitabine/Abraxane 06/01/2022 2.  Iron deficiency anemia-improved -Feraheme 510 mg 02/20/2021 3.  Protein calorie malnutrition 4.  Possible right ventricular filling defect on noncontrast CT scan MRI cardiac morphology 02/21/2021-mass in the mid RV attached to the interventricular septum measures 16 mm x 6 mm.  Mass appears heterogeneous with area of enhancement on LGE imaging.  Mass is concerning for metastatic tumor.  Normal LV size and systolic function.  Normal RV size and systolic function. 5.  Hypertension 6.  History of CVA 7.  Hyperlipidemia 8.  Hypothyroidism 9.  Morbid obesity 10.  Asthma 11.  Migraines 12.  Pain secondary to #1 13.  Port-A-Cath placement 02/25/2021 14.  Hypokalemia 04/01/2021-started a potassium supplement 15.  Hospital admission 06/17/2021-intractable nausea and vomiting EGD 06/19/2021-diffuse gastritis, nonobstructing duodenal mass, biopsy of stomach-no malignancy or H. pylori, duodenal mass-adenocarcinoma 16.  Hospital admission 07/12/2021 with nausea/vomiting, abdominal pain, and hypokalemia 17.  Percutaneous gastrostomy tube placement 07/01/2021  Disposition: [name] appears unchanged.  She is on active treatment with gemcitabine/Abraxane.  There is no clinical evidence of disease progression.  Plan to proceed with  gemcitabine/Abraxane today as scheduled.  CBC and chemistry panel reviewed.  Labs adequate to proceed as above.  She will return for follow-up and treatment in 2 weeks.    [name]   06/01/2022  12:15 PM

## 2022-06-01 NOTE — Patient Instructions (Signed)

## 2022-06-01 NOTE — Progress Notes (Signed)
Patient seen by Lisa Thomas NP today  Vitals are within treatment parameters.  Labs reviewed by Lisa Thomas NP and are within treatment parameters.  Per physician team, patient is ready for treatment and there are NO modifications to the treatment plan.     

## 2022-06-01 NOTE — Patient Instructions (Signed)
[facility]   Discharge Instructions: Thank you for choosing [facility] to provide your oncology and hematology care.   If you have a lab appointment with the [facility], please go directly to the [facility] and check in at the registration area.   Wear comfortable clothing and clothing appropriate for easy access to any Portacath or PICC line.   We strive to give you quality time with your provider. You may need to reschedule your appointment if you arrive late (15 or more minutes).  Arriving late affects you and other patients whose appointments are after yours.  Also, if you miss three or more appointments without notifying the office, you may be dismissed from the clinic at the provider's discretion.      For prescription refill requests, have your pharmacy contact our office and allow 72 hours for refills to be completed.    Today you received the following chemotherapy and/or immunotherapy agents Abraxane, Gemzar      To help prevent nausea and vomiting after your treatment, we encourage you to take your nausea medication as directed.  BELOW ARE SYMPTOMS THAT SHOULD BE REPORTED IMMEDIATELY: *FEVER GREATER THAN 100.4 F (38 C) OR HIGHER *CHILLS OR SWEATING *NAUSEA AND VOMITING THAT IS NOT CONTROLLED WITH YOUR NAUSEA MEDICATION *UNUSUAL SHORTNESS OF BREATH *UNUSUAL BRUISING OR BLEEDING *URINARY PROBLEMS (pain or burning when urinating, or frequent urination) *BOWEL PROBLEMS (unusual diarrhea, constipation, pain near the anus) TENDERNESS IN MOUTH AND THROAT WITH OR WITHOUT PRESENCE OF ULCERS (sore throat, sores in mouth, or a toothache) UNUSUAL RASH, SWELLING OR PAIN  UNUSUAL VAGINAL DISCHARGE OR ITCHING   Items with * indicate a potential emergency and should be followed up as soon as possible or go to the Emergency Department if any problems should occur.  Please show the CHEMOTHERAPY ALERT CARD or IMMUNOTHERAPY ALERT CARD at  check-in to the Emergency Department and triage nurse.  Should you have questions after your visit or need to cancel or reschedule your appointment, please contact [facility]  Dept: [phone]  and follow the prompts.  Office hours are 8:00 a.m. to 4:30 p.m. Monday - Friday. Please note that voicemails left after 4:00 p.m. may not be returned until the following business day.  We are closed weekends and major holidays. You have access to a nurse at all times for urgent questions. Please call the main number to the clinic Dept: [phone] and follow the prompts.   For any non-urgent questions, you may also contact your provider using MyChart. We now offer e-Visits for anyone 39 and older to request care online for non-urgent symptoms. For details visit [email or web address].   Also download the MyChart app! Go to the app store, search "MyChart", open the app, select [facility], and log in with your MyChart username and password.  Due to Covid, a mask is required upon entering the hospital/clinic. If you do not have a mask, one will be given to you upon arrival. For doctor visits, patients may have 1 support person aged 52 or older with them. For treatment visits, patients cannot have anyone with them due to current Covid guidelines and our immunocompromised population.   Nanoparticle Albumin-Bound Paclitaxel injection What is this medication? NANOPARTICLE ALBUMIN-BOUND PACLITAXEL (Na no PAHR ti kuhl  al BYOO muhn-bound  PAK li TAX el) is a chemotherapy drug. It targets fast dividing cells, like cancer cells, and causes these cells to die. This medicine is used  to treat advanced breast cancer, lung cancer, and pancreatic cancer. This medicine may be used for other purposes; ask your health care provider or pharmacist if you have questions. COMMON BRAND NAME(S): Abraxane What should I tell my care team before I take this medication? They need to know if you have any  of these conditions: kidney disease liver disease low blood counts, like low white cell, platelet, or red cell counts lung or breathing disease, like asthma tingling of the fingers or toes, or other nerve disorder an unusual or allergic reaction to paclitaxel, albumin, other chemotherapy, other medicines, foods, dyes, or preservatives pregnant or trying to get pregnant breast-feeding How should I use this medication? This drug is given as an infusion into a vein. It is administered in a hospital or clinic by a specially trained health care professional. Talk to your pediatrician regarding the use of this medicine in children. Special care may be needed. Overdosage: If you think you have taken too much of this medicine contact a poison control center or emergency room at once. NOTE: This medicine is only for you. Do not share this medicine with others. What if I miss a dose? It is important not to miss your dose. Call your doctor or health care professional if you are unable to keep an appointment. What may interact with this medication? This medicine may interact with the following medications: antiviral medicines for hepatitis, HIV or AIDS certain antibiotics like erythromycin and clarithromycin certain medicines for fungal infections like ketoconazole and itraconazole certain medicines for seizures like carbamazepine, phenobarbital, phenytoin gemfibrozil nefazodone rifampin St. John's wort This list may not describe all possible interactions. Give your health care provider a list of all the medicines, herbs, non-prescription drugs, or dietary supplements you use. Also tell them if you smoke, drink alcohol, or use illegal drugs. Some items may interact with your medicine. What should I watch for while using this medication? Your condition will be monitored carefully while you are receiving this medicine. You will need important blood work done while you are taking this medicine. This  medicine can cause serious allergic reactions. If you experience allergic reactions like skin rash, itching or hives, swelling of the face, lips, or tongue, tell your doctor or health care professional right away. In some cases, you may be given additional medicines to help with side effects. Follow all directions for their use. This drug may make you feel generally unwell. This is not uncommon, as chemotherapy can affect healthy cells as well as cancer cells. Report any side effects. Continue your course of treatment even though you feel ill unless your doctor tells you to stop. Call your doctor or health care professional for advice if you get a fever, chills or sore throat, or other symptoms of a cold or flu. Do not treat yourself. This drug decreases your body's ability to fight infections. Try to avoid being around people who are sick. This medicine may increase your risk to bruise or bleed. Call your doctor or health care professional if you notice any unusual bleeding. Be careful brushing and flossing your teeth or using a toothpick because you may get an infection or bleed more easily. If you have any dental work done, tell your dentist you are receiving this medicine. Avoid taking products that contain aspirin, acetaminophen, ibuprofen, naproxen, or ketoprofen unless instructed by your doctor. These medicines may hide a fever. Do not become pregnant while taking this medicine or for 6 months after stopping it. Women should  inform their doctor if they wish to become pregnant or think they might be pregnant. Men should not father a child while taking this medicine or for 3 months after stopping it. There is a potential for serious side effects to an unborn child. Talk to your health care professional or pharmacist for more information. Do not breast-feed an infant while taking this medicine or for 2 weeks after stopping it. This medicine may interfere with the ability to get pregnant or to father a  child. You should talk to your doctor or health care professional if you are concerned about your fertility. What side effects may I notice from receiving this medication? Side effects that you should report to your doctor or health care professional as soon as possible: allergic reactions like skin rash, itching or hives, swelling of the face, lips, or tongue breathing problems changes in vision fast, irregular heartbeat low blood pressure mouth sores pain, tingling, numbness in the hands or feet signs of decreased platelets or bleeding - bruising, pinpoint red spots on the skin, black, tarry stools, blood in the urine signs of decreased red blood cells - unusually weak or tired, feeling faint or lightheaded, falls signs of infection - fever or chills, cough, sore throat, pain or difficulty passing urine signs and symptoms of liver injury like dark yellow or brown urine; general ill feeling or flu-like symptoms; light-colored stools; loss of appetite; nausea; right upper belly pain; unusually weak or tired; yellowing of the eyes or skin swelling of the ankles, feet, hands unusually slow heartbeat Side effects that usually do not require medical attention (report to your doctor or health care professional if they continue or are bothersome): diarrhea hair loss loss of appetite nausea, vomiting tiredness This list may not describe all possible side effects. Call your doctor for medical advice about side effects. You may report side effects to FDA at 1-800-FDA-1088. Where should I keep my medication? This drug is given in a hospital or clinic and will not be stored at home. NOTE: This sheet is a summary. It may not cover all possible information. If you have questions about this medicine, talk to your doctor, pharmacist, or health care provider.  2023 Elsevier/Gold Standard (2017-08-02 00:00:00)  Gemcitabine injection What is this medication? GEMCITABINE (jem SYE ta been) is a  chemotherapy drug. This medicine is used to treat many types of cancer like breast cancer, lung cancer, pancreatic cancer, and ovarian cancer. This medicine may be used for other purposes; ask your health care provider or pharmacist if you have questions. COMMON BRAND NAME(S): Gemzar, Infugem What should I tell my care team before I take this medication? They need to know if you have any of these conditions: blood disorders infection kidney disease liver disease lung or breathing disease, like asthma recent or ongoing radiation therapy an unusual or allergic reaction to gemcitabine, other chemotherapy, other medicines, foods, dyes, or preservatives pregnant or trying to get pregnant breast-feeding How should I use this medication? This drug is given as an infusion into a vein. It is administered in a hospital or clinic by a specially trained health care professional. Talk to your pediatrician regarding the use of this medicine in children. Special care may be needed. Overdosage: If you think you have taken too much of this medicine contact a poison control center or emergency room at once. NOTE: This medicine is only for you. Do not share this medicine with others. What if I miss a dose? It is important not  to miss your dose. Call your doctor or health care professional if you are unable to keep an appointment. What may interact with this medication? medicines to increase blood counts like filgrastim, pegfilgrastim, sargramostim some other chemotherapy drugs like cisplatin vaccines Talk to your doctor or health care professional before taking any of these medicines: acetaminophen aspirin ibuprofen ketoprofen naproxen This list may not describe all possible interactions. Give your health care provider a list of all the medicines, herbs, non-prescription drugs, or dietary supplements you use. Also tell them if you smoke, drink alcohol, or use illegal drugs. Some items may interact with  your medicine. What should I watch for while using this medication? Visit your doctor for checks on your progress. This drug may make you feel generally unwell. This is not uncommon, as chemotherapy can affect healthy cells as well as cancer cells. Report any side effects. Continue your course of treatment even though you feel ill unless your doctor tells you to stop. In some cases, you may be given additional medicines to help with side effects. Follow all directions for their use. Call your doctor or health care professional for advice if you get a fever, chills or sore throat, or other symptoms of a cold or flu. Do not treat yourself. This drug decreases your body's ability to fight infections. Try to avoid being around people who are sick. This medicine may increase your risk to bruise or bleed. Call your doctor or health care professional if you notice any unusual bleeding. Be careful brushing and flossing your teeth or using a toothpick because you may get an infection or bleed more easily. If you have any dental work done, tell your dentist you are receiving this medicine. Avoid taking products that contain aspirin, acetaminophen, ibuprofen, naproxen, or ketoprofen unless instructed by your doctor. These medicines may hide a fever. Do not become pregnant while taking this medicine or for 6 months after stopping it. Women should inform their doctor if they wish to become pregnant or think they might be pregnant. Men should not father a child while taking this medicine and for 3 months after stopping it. There is a potential for serious side effects to an unborn child. Talk to your health care professional or pharmacist for more information. Do not breast-feed an infant while taking this medicine or for at least 1 week after stopping it. Men should inform their doctors if they wish to father a child. This medicine may lower sperm counts. Talk with your doctor or health care professional if you are  concerned about your fertility. What side effects may I notice from receiving this medication? Side effects that you should report to your doctor or health care professional as soon as possible: allergic reactions like skin rash, itching or hives, swelling of the face, lips, or tongue breathing problems pain, redness, or irritation at site where injected signs and symptoms of a dangerous change in heartbeat or heart rhythm like chest pain; dizziness; fast or irregular heartbeat; palpitations; feeling faint or lightheaded, falls; breathing problems signs of decreased platelets or bleeding - bruising, pinpoint red spots on the skin, black, tarry stools, blood in the urine signs of decreased red blood cells - unusually weak or tired, feeling faint or lightheaded, falls signs of infection - fever or chills, cough, sore throat, pain or difficulty passing urine signs and symptoms of kidney injury like trouble passing urine or change in the amount of urine signs and symptoms of liver injury like dark yellow or  brown urine; general ill feeling or flu-like symptoms; light-colored stools; loss of appetite; nausea; right upper belly pain; unusually weak or tired; yellowing of the eyes or skin swelling of ankles, feet, hands Side effects that usually do not require medical attention (report to your doctor or health care professional if they continue or are bothersome): constipation diarrhea hair loss loss of appetite nausea rash vomiting This list may not describe all possible side effects. Call your doctor for medical advice about side effects. You may report side effects to FDA at 1-800-FDA-1088. Where should I keep my medication? This drug is given in a hospital or clinic and will not be stored at home. NOTE: This sheet is a summary. It may not cover all possible information. If you have questions about this medicine, talk to your doctor, pharmacist, or health care provider.  2023 Elsevier/Gold  Standard (2018-02-22 00:00:00)

## 2022-06-01 NOTE — Telephone Encounter (Signed)
Place in a referral for nutrition and gave her a sample of Ensure

## 2022-06-02 LAB — CANCER ANTIGEN 19-9: CA 19-9: 7003 U/mL — ABNORMAL HIGH (ref 0–35)

## 2022-06-09 ENCOUNTER — Inpatient Hospital Stay: Payer: Medicaid Other | Admitting: Nutrition

## 2022-06-09 NOTE — Progress Notes (Signed)
Contacted patient for telephone visit however she was unavailable. Left message with name and phone number for return call.

## 2022-06-12 ENCOUNTER — Other Ambulatory Visit: Payer: Self-pay | Admitting: Oncology

## 2022-06-18 ENCOUNTER — Inpatient Hospital Stay: Payer: Medicaid Other

## 2022-06-18 ENCOUNTER — Inpatient Hospital Stay: Payer: Medicaid Other | Attending: Nurse Practitioner | Admitting: Oncology

## 2022-06-18 ENCOUNTER — Other Ambulatory Visit: Payer: Self-pay | Admitting: Oncology

## 2022-06-18 ENCOUNTER — Encounter: Payer: Self-pay | Admitting: *Deleted

## 2022-06-18 VITALS — BP 138/80 | HR 81 | Temp 98.1°F | Resp 18 | Ht 64.5 in | Wt 196.0 lb

## 2022-06-18 VITALS — BP 159/94 | HR 66

## 2022-06-18 DIAGNOSIS — C259 Malignant neoplasm of pancreas, unspecified: Secondary | ICD-10-CM | POA: Diagnosis not present

## 2022-06-18 DIAGNOSIS — C250 Malignant neoplasm of head of pancreas: Secondary | ICD-10-CM | POA: Diagnosis not present

## 2022-06-18 DIAGNOSIS — C787 Secondary malignant neoplasm of liver and intrahepatic bile duct: Secondary | ICD-10-CM | POA: Diagnosis not present

## 2022-06-18 DIAGNOSIS — Z5111 Encounter for antineoplastic chemotherapy: Secondary | ICD-10-CM | POA: Diagnosis present

## 2022-06-18 LAB — CBC WITH DIFFERENTIAL (CANCER CENTER ONLY)
Abs Immature Granulocytes: 0.01 10*3/uL (ref 0.00–0.07)
Basophils Absolute: 0 10*3/uL (ref 0.0–0.1)
Basophils Relative: 1 %
Eosinophils Absolute: 0.3 10*3/uL (ref 0.0–0.5)
Eosinophils Relative: 9 %
HCT: 28.8 % — ABNORMAL LOW (ref 36.0–46.0)
Hemoglobin: 8.9 g/dL — ABNORMAL LOW (ref 12.0–15.0)
Immature Granulocytes: 0 %
Lymphocytes Relative: 33 %
Lymphs Abs: 1.1 10*3/uL (ref 0.7–4.0)
MCH: 28.4 pg (ref 26.0–34.0)
MCHC: 30.9 g/dL (ref 30.0–36.0)
MCV: 92 fL (ref 80.0–100.0)
Monocytes Absolute: 0.4 10*3/uL (ref 0.1–1.0)
Monocytes Relative: 13 %
Neutro Abs: 1.5 10*3/uL — ABNORMAL LOW (ref 1.7–7.7)
Neutrophils Relative %: 44 %
Platelet Count: 265 10*3/uL (ref 150–400)
RBC: 3.13 MIL/uL — ABNORMAL LOW (ref 3.87–5.11)
RDW: 18.2 % — ABNORMAL HIGH (ref 11.5–15.5)
WBC Count: 3.3 10*3/uL — ABNORMAL LOW (ref 4.0–10.5)
nRBC: 0 % (ref 0.0–0.2)

## 2022-06-18 LAB — CMP (CANCER CENTER ONLY)
ALT: 8 U/L (ref 0–44)
AST: 16 U/L (ref 15–41)
Albumin: 3.7 g/dL (ref 3.5–5.0)
Alkaline Phosphatase: 107 U/L (ref 38–126)
Anion gap: 7 (ref 5–15)
BUN: 16 mg/dL (ref 6–20)
CO2: 30 mmol/L (ref 22–32)
Calcium: 9.6 mg/dL (ref 8.9–10.3)
Chloride: 105 mmol/L (ref 98–111)
Creatinine: 0.95 mg/dL (ref 0.44–1.00)
GFR, Estimated: >60 mL/min (ref >60)
Glucose, Bld: 85 mg/dL (ref 70–99)
Potassium: 3.9 mmol/L (ref 3.5–5.1)
Sodium: 142 mmol/L (ref 135–145)
Total Bilirubin: 0.4 mg/dL (ref 0.3–1.2)
Total Protein: 6.7 g/dL (ref 6.5–8.1)

## 2022-06-18 LAB — SAVE SMEAR(SSMR), FOR PROVIDER SLIDE REVIEW

## 2022-06-18 MED ORDER — MORPHINE SULFATE (CONCENTRATE) 10 MG/0.5ML PO SOLN: 10.0000 mg | ORAL | 0 refills | Status: DC | PRN

## 2022-06-18 MED ORDER — SODIUM CHLORIDE 0.9 % IV SOLN
1000.0000 mg/m2 | Freq: Once | INTRAVENOUS | Status: AC
  Administered 2022-06-18: 1900 mg via INTRAVENOUS
  Filled 2022-06-18: qty 49.97

## 2022-06-18 MED ORDER — SODIUM CHLORIDE 0.9% FLUSH
10.0000 mL | INTRAVENOUS | Status: DC | PRN
  Administered 2022-06-18: 10 mL

## 2022-06-18 MED ORDER — PROCHLORPERAZINE MALEATE 10 MG PO TABS
10.0000 mg | ORAL_TABLET | Freq: Once | ORAL | Status: AC
  Administered 2022-06-18: 10 mg via ORAL
  Filled 2022-06-18: qty 1

## 2022-06-18 MED ORDER — SODIUM CHLORIDE 0.9 % IV SOLN: Freq: Once | INTRAVENOUS | Status: AC

## 2022-06-18 MED ORDER — MORPHINE SULFATE ER 60 MG PO TBCR: 60.0000 mg | EXTENDED_RELEASE_TABLET | Freq: Two times a day (BID) | ORAL | 0 refills | Status: DC

## 2022-06-18 MED ORDER — HEPARIN SOD (PORK) LOCK FLUSH 100 UNIT/ML IV SOLN
500.0000 [IU] | Freq: Once | INTRAVENOUS | Status: AC | PRN
  Administered 2022-06-18: 500 [IU]

## 2022-06-18 MED ORDER — PACLITAXEL PROTEIN-BOUND CHEMO INJECTION 100 MG
100.0000 mg/m2 | Freq: Once | INTRAVENOUS | Status: AC
  Administered 2022-06-18: 200 mg via INTRAVENOUS
  Filled 2022-06-18: qty 40

## 2022-06-18 MED ORDER — OXYCODONE HCL 5 MG PO TABS: 5.0000 mg | ORAL_TABLET | ORAL | 0 refills | Status: DC | PRN

## 2022-06-18 MED ORDER — GABAPENTIN 300 MG PO CAPS: ORAL_CAPSULE | ORAL | 2 refills | Status: DC

## 2022-06-18 NOTE — Patient Instructions (Signed)
Wendy Hoffman   Discharge Instructions: Thank you for choosing [facility] to provide your oncology and hematology care.   If you have a lab appointment with the [facility], please go directly to the [facility] and check in at the registration area.   Wear comfortable clothing and clothing appropriate for easy access to any Portacath or PICC line.   We strive to give you quality time with your provider. You may need to reschedule your appointment if you arrive late (15 or more minutes).  Arriving late affects you and other patients whose appointments are after yours.  Also, if you miss three or more appointments without notifying the office, you may be dismissed from the clinic at the provider's discretion.      For prescription refill requests, have your pharmacy contact our office and allow 72 hours for refills to be completed.    Today you received the following chemotherapy and/or immunotherapy agents Abraxane, Gemzar      To help prevent nausea and vomiting after your treatment, we encourage you to take your nausea medication as directed.  BELOW ARE SYMPTOMS THAT SHOULD BE REPORTED IMMEDIATELY: *FEVER GREATER THAN 100.4 F (38 C) OR HIGHER *CHILLS OR SWEATING *NAUSEA AND VOMITING THAT IS NOT CONTROLLED WITH YOUR NAUSEA MEDICATION *UNUSUAL SHORTNESS OF BREATH *UNUSUAL BRUISING OR BLEEDING *URINARY PROBLEMS (pain or burning when urinating, or frequent urination) *BOWEL PROBLEMS (unusual diarrhea, constipation, pain near the anus) TENDERNESS IN MOUTH AND THROAT WITH OR WITHOUT PRESENCE OF ULCERS (sore throat, sores in mouth, or a toothache) UNUSUAL RASH, SWELLING OR PAIN  UNUSUAL VAGINAL DISCHARGE OR ITCHING   Items with * indicate a potential emergency and should be followed up as soon as possible or go to the Emergency Department if any problems should occur.  Please show the CHEMOTHERAPY ALERT CARD or IMMUNOTHERAPY ALERT CARD at  check-in to the Emergency Department and triage nurse.  Should you have questions after your visit or need to cancel or reschedule your appointment, please contact [facility]  Dept: [phone]  and follow the prompts.  Office hours are 8:00 a.m. to 4:30 p.m. Monday - Friday. Please note that voicemails left after 4:00 p.m. may not be returned until the following business day.  We are closed weekends and major holidays. You have access to a nurse at all times for urgent questions. Please call the main number to the clinic Dept: [phone] and follow the prompts.   For any non-urgent questions, you may also contact your provider using MyChart. We now offer e-Visits for anyone 62 and older to request care online for non-urgent symptoms. For details visit [email or web address].   Also download the MyChart app! Go to the app store, search "MyChart", open the app, select [facility], and log in with your MyChart username and password.  Masks are optional in the cancer centers. If you would like for your care team to wear a mask while they are taking care of you, please let them know. For doctor visits, patients may have with them one support person who is at least 54 years old. At this time, visitors are not allowed in the infusion area.  Nanoparticle Albumin-Bound Paclitaxel injection What is this medication? NANOPARTICLE ALBUMIN-BOUND PACLITAXEL (Na no PAHR ti kuhl  al BYOO muhn-bound  PAK li TAX el) is a chemotherapy drug. It targets fast dividing cells, like cancer cells, and causes these cells to die. This medicine is used to  treat advanced breast cancer, lung cancer, and pancreatic cancer. This medicine may be used for other purposes; ask your health care provider or pharmacist if you have questions. COMMON BRAND NAME(S): Abraxane What should I tell my care team before I take this medication? They need to know if you have any of these conditions: kidney  disease liver disease low blood counts, like low white cell, platelet, or red cell counts lung or breathing disease, like asthma tingling of the fingers or toes, or other nerve disorder an unusual or allergic reaction to paclitaxel, albumin, other chemotherapy, other medicines, foods, dyes, or preservatives pregnant or trying to get pregnant breast-feeding How should I use this medication? This drug is given as an infusion into a vein. It is administered in a hospital or clinic by a specially trained health care professional. Talk to your pediatrician regarding the use of this medicine in children. Special care may be needed. Overdosage: If you think you have taken too much of this medicine contact a poison control center or emergency room at once. NOTE: This medicine is only for you. Do not share this medicine with others. What if I miss a dose? It is important not to miss your dose. Call your doctor or health care professional if you are unable to keep an appointment. What may interact with this medication? This medicine may interact with the following medications: antiviral medicines for hepatitis, HIV or AIDS certain antibiotics like erythromycin and clarithromycin certain medicines for fungal infections like ketoconazole and itraconazole certain medicines for seizures like carbamazepine, phenobarbital, phenytoin gemfibrozil nefazodone rifampin St. John's wort This list may not describe all possible interactions. Give your health care provider a list of all the medicines, herbs, non-prescription drugs, or dietary supplements you use. Also tell them if you smoke, drink alcohol, or use illegal drugs. Some items may interact with your medicine. What should I watch for while using this medication? Your condition will be monitored carefully while you are receiving this medicine. You will need important blood work done while you are taking this medicine. This medicine can cause serious  allergic reactions. If you experience allergic reactions like skin rash, itching or hives, swelling of the face, lips, or tongue, tell your doctor or health care professional right away. In some cases, you may be given additional medicines to help with side effects. Follow all directions for their use. This drug may make you feel generally unwell. This is not uncommon, as chemotherapy can affect healthy cells as well as cancer cells. Report any side effects. Continue your course of treatment even though you feel ill unless your doctor tells you to stop. Call your doctor or health care professional for advice if you get a fever, chills or sore throat, or other symptoms of a cold or flu. Do not treat yourself. This drug decreases your body's ability to fight infections. Try to avoid being around people who are sick. This medicine may increase your risk to bruise or bleed. Call your doctor or health care professional if you notice any unusual bleeding. Be careful brushing and flossing your teeth or using a toothpick because you may get an infection or bleed more easily. If you have any dental work done, tell your dentist you are receiving this medicine. Avoid taking products that contain aspirin, acetaminophen, ibuprofen, naproxen, or ketoprofen unless instructed by your doctor. These medicines may hide a fever. Do not become pregnant while taking this medicine or for 6 months after stopping it. Women should inform  their doctor if they wish to become pregnant or think they might be pregnant. Men should not father a child while taking this medicine or for 3 months after stopping it. There is a potential for serious side effects to an unborn child. Talk to your health care professional or pharmacist for more information. Do not breast-feed an infant while taking this medicine or for 2 weeks after stopping it. This medicine may interfere with the ability to get pregnant or to father a child. You should talk to  your doctor or health care professional if you are concerned about your fertility. What side effects may I notice from receiving this medication? Side effects that you should report to your doctor or health care professional as soon as possible: allergic reactions like skin rash, itching or hives, swelling of the face, lips, or tongue breathing problems changes in vision fast, irregular heartbeat low blood pressure mouth sores pain, tingling, numbness in the hands or feet signs of decreased platelets or bleeding - bruising, pinpoint red spots on the skin, black, tarry stools, blood in the urine signs of decreased red blood cells - unusually weak or tired, feeling faint or lightheaded, falls signs of infection - fever or chills, cough, sore throat, pain or difficulty passing urine signs and symptoms of liver injury like dark yellow or brown urine; general ill feeling or flu-like symptoms; light-colored stools; loss of appetite; nausea; right upper belly pain; unusually weak or tired; yellowing of the eyes or skin swelling of the ankles, feet, hands unusually slow heartbeat Side effects that usually do not require medical attention (report to your doctor or health care professional if they continue or are bothersome): diarrhea hair loss loss of appetite nausea, vomiting tiredness This list may not describe all possible side effects. Call your doctor for medical advice about side effects. You may report side effects to FDA at 1-800-FDA-1088. Where should I keep my medication? This drug is given in a hospital or clinic and will not be stored at home. NOTE: This sheet is a summary. It may not cover all possible information. If you have questions about this medicine, talk to your doctor, pharmacist, or health care provider.  2023 Elsevier/Gold Standard (2017-08-02 00:00:00)  Gemcitabine injection What is this medication? GEMCITABINE (jem SYE ta been) is a chemotherapy drug. This medicine is  used to treat many types of cancer like breast cancer, lung cancer, pancreatic cancer, and ovarian cancer. This medicine may be used for other purposes; ask your health care provider or pharmacist if you have questions. COMMON BRAND NAME(S): Gemzar, Infugem What should I tell my care team before I take this medication? They need to know if you have any of these conditions: blood disorders infection kidney disease liver disease lung or breathing disease, like asthma recent or ongoing radiation therapy an unusual or allergic reaction to gemcitabine, other chemotherapy, other medicines, foods, dyes, or preservatives pregnant or trying to get pregnant breast-feeding How should I use this medication? This drug is given as an infusion into a vein. It is administered in a hospital or clinic by a specially trained health care professional. Talk to your pediatrician regarding the use of this medicine in children. Special care may be needed. Overdosage: If you think you have taken too much of this medicine contact a poison control center or emergency room at once. NOTE: This medicine is only for you. Do not share this medicine with others. What if I miss a dose? It is important not to  miss your dose. Call your doctor or health care professional if you are unable to keep an appointment. What may interact with this medication? medicines to increase blood counts like filgrastim, pegfilgrastim, sargramostim some other chemotherapy drugs like cisplatin vaccines Talk to your doctor or health care professional before taking any of these medicines: acetaminophen aspirin ibuprofen ketoprofen naproxen This list may not describe all possible interactions. Give your health care provider a list of all the medicines, herbs, non-prescription drugs, or dietary supplements you use. Also tell them if you smoke, drink alcohol, or use illegal drugs. Some items may interact with your medicine. What should I watch  for while using this medication? Visit your doctor for checks on your progress. This drug may make you feel generally unwell. This is not uncommon, as chemotherapy can affect healthy cells as well as cancer cells. Report any side effects. Continue your course of treatment even though you feel ill unless your doctor tells you to stop. In some cases, you may be given additional medicines to help with side effects. Follow all directions for their use. Call your doctor or health care professional for advice if you get a fever, chills or sore throat, or other symptoms of a cold or flu. Do not treat yourself. This drug decreases your body's ability to fight infections. Try to avoid being around people who are sick. This medicine may increase your risk to bruise or bleed. Call your doctor or health care professional if you notice any unusual bleeding. Be careful brushing and flossing your teeth or using a toothpick because you may get an infection or bleed more easily. If you have any dental work done, tell your dentist you are receiving this medicine. Avoid taking products that contain aspirin, acetaminophen, ibuprofen, naproxen, or ketoprofen unless instructed by your doctor. These medicines may hide a fever. Do not become pregnant while taking this medicine or for 6 months after stopping it. Women should inform their doctor if they wish to become pregnant or think they might be pregnant. Men should not father a child while taking this medicine and for 3 months after stopping it. There is a potential for serious side effects to an unborn child. Talk to your health care professional or pharmacist for more information. Do not breast-feed an infant while taking this medicine or for at least 1 week after stopping it. Men should inform their doctors if they wish to father a child. This medicine may lower sperm counts. Talk with your doctor or health care professional if you are concerned about your fertility. What  side effects may I notice from receiving this medication? Side effects that you should report to your doctor or health care professional as soon as possible: allergic reactions like skin rash, itching or hives, swelling of the face, lips, or tongue breathing problems pain, redness, or irritation at site where injected signs and symptoms of a dangerous change in heartbeat or heart rhythm like chest pain; dizziness; fast or irregular heartbeat; palpitations; feeling faint or lightheaded, falls; breathing problems signs of decreased platelets or bleeding - bruising, pinpoint red spots on the skin, black, tarry stools, blood in the urine signs of decreased red blood cells - unusually weak or tired, feeling faint or lightheaded, falls signs of infection - fever or chills, cough, sore throat, pain or difficulty passing urine signs and symptoms of kidney injury like trouble passing urine or change in the amount of urine signs and symptoms of liver injury like dark yellow or brown  urine; general ill feeling or flu-like symptoms; light-colored stools; loss of appetite; nausea; right upper belly pain; unusually weak or tired; yellowing of the eyes or skin swelling of ankles, feet, hands Side effects that usually do not require medical attention (report to your doctor or health care professional if they continue or are bothersome): constipation diarrhea hair loss loss of appetite nausea rash vomiting This list may not describe all possible side effects. Call your doctor for medical advice about side effects. You may report side effects to FDA at 1-800-FDA-1088. Where should I keep my medication? This drug is given in a hospital or clinic and will not be stored at home. NOTE: This sheet is a summary. It may not cover all possible information. If you have questions about this medicine, talk to your doctor, pharmacist, or health care provider.  2023 Elsevier/Gold Standard (2018-02-22 00:00:00)

## 2022-06-18 NOTE — Progress Notes (Signed)
[facility] OFFICE PROGRESS NOTE   Diagnosis: Pancreas cancer  INTERVAL HISTORY:   Wendy Hoffman completed another cycle of gemcitabine/Abraxane on 06/01/2022.  No nausea, rash, or fever.  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  Pain is controlled with current narcotic regimen.  She reports 1 episode of emesis over the past few weeks.  She has a poor appetite.  Objective:  Vital signs in last 24 hours:  Blood pressure 138/80, pulse 81, temperature 98.1 F (36.7 C), temperature source Oral, resp. rate 18, height 5' 4.5" (1.638 m), weight 196 lb (88.9 kg), SpO2 100 %.    HEENT: No thrush or ulcers Resp: Lungs clear bilaterally Cardio: Regular rate and rhythm GI: No hepatosplenomegaly, tender in the mid and right upper abdomen Vascular: No leg edema     Portacath/PICC-without erythema  Lab Results:  Lab Results  Component Value Date   WBC 3.3 (L) 06/18/2022   HGB 8.9 (L) 06/18/2022   HCT 28.8 (L) 06/18/2022   MCV 92.0 06/18/2022   PLT 265 06/18/2022   NEUTROABS PENDING 06/18/2022    CMP  Lab Results  Component Value Date   NA 142 06/18/2022   K 3.9 06/18/2022   CL 105 06/18/2022   CO2 30 06/18/2022   GLUCOSE 85 06/18/2022   BUN 16 06/18/2022   CREATININE 0.95 06/18/2022   CALCIUM 9.6 06/18/2022   PROT 6.7 06/18/2022   ALBUMIN 3.7 06/18/2022   AST 16 06/18/2022   ALT 8 06/18/2022   ALKPHOS 107 06/18/2022   BILITOT 0.4 06/18/2022   GFRNONAA >60 06/18/2022   GFRAA >60 07/21/2020    Lab Results  Component Value Date   CEA1 68.8 (H) 02/23/2021   CEA <0.5 10/15/2008   EFE071 7,003 (H) 06/01/2022   Medications: I have reviewed the patient's current medications.   Assessment/Plan:  Pancreas cancer -CT abdomen/pelvis without contrast 02/16/2021-multiple large hypoechoic masses in the patient's liver consistent metastatic disease, ill-defined masslike area in the pancreatic head measuring approximate 4.7 cm, possible filling defect in the  right ventricle. -EGD 02/19/2021-large infiltrative mass with bleeding in the second portion of the duodenum, appears to be arising near the ampulla, partially obstructive with friable mucosa.  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 -Flexible sigmoidoscopy 02/19/2021-diverticulosis in the sigmoid colon, nonbleeding external and internal hemorrhoids -Cardiac CT 02/20/2021-mass in the mid RV attached to the interventricular septum measuring 16 mm x 6 mm and concerning for metastatic tumor -MRI abdomen with/without contrast 02/21/2021-mass in the posterior pancreatic head measuring 4.3 x 3.8 cm with obstruction of the central pancreatic duct and diffuse dilatation up to 6 mm consistent with pancreatic adenocarcinoma, liver lesions the largest mass of the central left lobe measuring 9.9 x 9.2 cm consistent with hepatic metastatic disease,  hepatomegaly. -Ultrasound-guided biopsy of a left liver lesion 02/24/2021-adenocarcinoma, morphologically similar to the duodenal biopsy; microsatellite stable, tumor mutation burden 1 -Negative genetic testing -Palliative radiation to the duodenal mass 02/25/2021-03/06/2021 -Cycle 1 FOLFOX 03/17/2021 -Cycle 2 FOLFOX 04/01/2021 -Cycle 3 FOLFIRINOX 04/14/2021 -Cycle 4 FOLFIRINOX 04/27/2021, Udenyca added -Cycle 5 FOLFIRINOX 05/12/2021, Udenyca -MRI abdomen 05/23/2021-decrease in size of pancreas mass and hepatic lesions, no progressive disease -Cycle 6 FOLFIRINOX 05/27/2021 -CT 06/17/2021-multiple liver metastases similar to her prior exam.  Pancreas mass not significantly changed. -CT 06/26/2021-stable pancreas mass, no significant change in size and number of known liver metastases. -CT abdomen/pelvis 07/12/2021-new gastrostomy tube.  Multiple liver masses slightly decreased in size from prior exam.  Pancreas head mass unchanged. -Cycle  1 gemcitabine/Abraxane 07/30/2021 -Cycle 2 gemcitabine/Abraxane 08/13/2021 -Cycle 3 gemcitabine 08/27/2021, Abraxane held  due to significant bilateral toe pain, question neuropathy -Cycle 4 gemcitabine, Abraxane held due to neuropathy, 09/10/2021 -Cycle 5 gemcitabine, Abraxane held due to neuropathy, 09/24/2021 -Cycle 6 gemcitabine, Abraxane held due to neuropathy 10/16/2021 -CTs 10/29/2021-mild progression of multifocal liver metastases; mild increase in size of hypoenhancing mass within the head of the pancreas; new mild pleural nodularity along the posterior lower lobes -Cycle 7 gemcitabine/Abraxane 11/03/2021 -Cycle 8 gemcitabine/Abraxane 11/23/2021 -Cycle 9 gemcitabine/Abraxane 12/10/2021 -Cycle 10 Gemcitabine/Abraxane 12/24/2021 -CT abdomen/pelvis 01/04/2022-decrease size of pancreas head mass, no change in multifocal hepatic metastases -Cycle 11 gemcitabine/Abraxane 01/07/2022 -Cycle 12 gemcitabine/Abraxane 02/04/2022 -Cycle 13 Gemcitabine/Abraxane 02/18/2022 -Cycle 14 gemcitabine/Abraxane 03/11/2022 -Cycle 15 gemcitabine/Abraxane 03/29/2022 -Cycle 16 gemcitabine/Abraxane 04/16/2022 -CT abdomen/pelvis 04/22/2022-mixed response in the liver, some lesions larger, some smaller, no change in pancreas head mass, no evidence of metastatic disease elsewhere in the abdomen or pelvis -Cycle 17 gemcitabine/Abraxane 05/17/2022 -Cycle 18 Gemcitabine/Abraxane 06/01/2022 -Cycle 19 gemcitabine/Abraxane 06/18/2022 2.  Iron deficiency anemia-improved -Feraheme 510 mg 02/20/2021 3.  Protein calorie malnutrition 4.  Possible right ventricular filling defect on noncontrast CT scan MRI cardiac morphology 02/21/2021-mass in the mid RV attached to the interventricular septum measures 16 mm x 6 mm.  Mass appears heterogeneous with area of enhancement on LGE imaging.  Mass is concerning for metastatic tumor.  Normal LV size and systolic function.  Normal RV size and systolic function. 5.  Hypertension 6.  History of CVA 7.  Hyperlipidemia 8.  Hypothyroidism 9.  Morbid obesity 10.  Asthma 11.  Migraines 12.  Pain secondary to #1 13.   Port-A-Cath placement 02/25/2021 14.  Hypokalemia 04/01/2021-started a potassium supplement 15.  Hospital admission 06/17/2021-intractable nausea and vomiting EGD 06/19/2021-diffuse gastritis, nonobstructing duodenal mass, biopsy of stomach-no malignancy or H. pylori, duodenal mass-adenocarcinoma 16.  Hospital admission 07/12/2021 with nausea/vomiting, abdominal pain, and hypokalemia 17.  Percutaneous gastrostomy tube placement 07/01/2021   Disposition: Wendy Hoffman appears stable.  She will complete another cycle of gemcitabine/Abraxane today.  The CA 19-9 was lower when she was here 2 weeks ago.  She will return for an office visit and chemotherapy in 2 weeks.  We will plan for restaging CT evaluation in August or September.  Betsy Coder, MD  06/18/2022  10:53 AM

## 2022-06-18 NOTE — Progress Notes (Signed)
Patient seen by Dr. Benay Spice today  Vitals are within treatment parameters.  Labs reviewed by Dr. Benay Spice and are within treatment parameters. Verbal report of ANC 1.5--OK to proceed.  Per physician team, patient is ready for treatment and there are NO modifications to the treatment plan.

## 2022-06-19 LAB — CANCER ANTIGEN 19-9: CA 19-9: 6180 U/mL — ABNORMAL HIGH (ref 0–35)

## 2022-06-22 ENCOUNTER — Telehealth: Payer: Self-pay

## 2022-06-22 NOTE — Telephone Encounter (Signed)
Pt called stating she had thrush on her tongue and would like a prescription to be sent in call to pharmacy to see if refill on previous prescription refill was still available. Pharmacy confirmed refill available. Return call to Pt left v/m stating she had a refill and prescription was called in.

## 2022-07-02 ENCOUNTER — Encounter: Payer: Self-pay | Admitting: Oncology

## 2022-07-04 ENCOUNTER — Other Ambulatory Visit: Payer: Self-pay | Admitting: Oncology

## 2022-07-05 ENCOUNTER — Other Ambulatory Visit: Payer: Self-pay

## 2022-07-05 ENCOUNTER — Encounter: Payer: Self-pay | Admitting: *Deleted

## 2022-07-05 ENCOUNTER — Inpatient Hospital Stay: Payer: Medicaid Other

## 2022-07-05 ENCOUNTER — Inpatient Hospital Stay (HOSPITAL_BASED_OUTPATIENT_CLINIC_OR_DEPARTMENT_OTHER): Payer: Medicaid Other | Admitting: Oncology

## 2022-07-05 VITALS — BP 150/90 | HR 81 | Temp 98.2°F | Resp 18 | Ht 64.0 in | Wt 187.0 lb

## 2022-07-05 VITALS — BP 180/98 | HR 81 | Temp 97.8°F | Resp 18

## 2022-07-05 DIAGNOSIS — C259 Malignant neoplasm of pancreas, unspecified: Secondary | ICD-10-CM | POA: Diagnosis not present

## 2022-07-05 DIAGNOSIS — C250 Malignant neoplasm of head of pancreas: Secondary | ICD-10-CM

## 2022-07-05 DIAGNOSIS — Z5111 Encounter for antineoplastic chemotherapy: Secondary | ICD-10-CM | POA: Diagnosis not present

## 2022-07-05 LAB — CBC WITH DIFFERENTIAL (CANCER CENTER ONLY)
Abs Immature Granulocytes: 0.01 10*3/uL (ref 0.00–0.07)
Basophils Absolute: 0 10*3/uL (ref 0.0–0.1)
Basophils Relative: 1 %
Eosinophils Absolute: 0.3 10*3/uL (ref 0.0–0.5)
Eosinophils Relative: 7 %
HCT: 27.8 % — ABNORMAL LOW (ref 36.0–46.0)
Hemoglobin: 9 g/dL — ABNORMAL LOW (ref 12.0–15.0)
Immature Granulocytes: 0 %
Lymphocytes Relative: 17 %
Lymphs Abs: 0.9 10*3/uL (ref 0.7–4.0)
MCH: 29.5 pg (ref 26.0–34.0)
MCHC: 32.4 g/dL (ref 30.0–36.0)
MCV: 91.1 fL (ref 80.0–100.0)
Monocytes Absolute: 0.6 10*3/uL (ref 0.1–1.0)
Monocytes Relative: 11 %
Neutro Abs: 3.3 10*3/uL (ref 1.7–7.7)
Neutrophils Relative %: 64 %
Platelet Count: 352 10*3/uL (ref 150–400)
RBC: 3.05 MIL/uL — ABNORMAL LOW (ref 3.87–5.11)
RDW: 18.6 % — ABNORMAL HIGH (ref 11.5–15.5)
WBC Count: 5.2 10*3/uL (ref 4.0–10.5)
nRBC: 0 % (ref 0.0–0.2)

## 2022-07-05 LAB — CMP (CANCER CENTER ONLY)
ALT: 9 U/L (ref 0–44)
AST: 12 U/L — ABNORMAL LOW (ref 15–41)
Albumin: 3.8 g/dL (ref 3.5–5.0)
Alkaline Phosphatase: 119 U/L (ref 38–126)
Anion gap: 9 (ref 5–15)
BUN: 15 mg/dL (ref 6–20)
CO2: 29 mmol/L (ref 22–32)
Calcium: 9.8 mg/dL (ref 8.9–10.3)
Chloride: 105 mmol/L (ref 98–111)
Creatinine: 0.81 mg/dL (ref 0.44–1.00)
GFR, Estimated: >60 mL/min (ref >60)
Glucose, Bld: 100 mg/dL — ABNORMAL HIGH (ref 70–99)
Potassium: 4.1 mmol/L (ref 3.5–5.1)
Sodium: 143 mmol/L (ref 135–145)
Total Bilirubin: 0.3 mg/dL (ref 0.3–1.2)
Total Protein: 6.7 g/dL (ref 6.5–8.1)

## 2022-07-05 LAB — FERRITIN: Ferritin: 58 ng/mL (ref 11–307)

## 2022-07-05 MED ORDER — SODIUM CHLORIDE 0.9 % IV SOLN
1000.0000 mg/m2 | Freq: Once | INTRAVENOUS | Status: AC
  Administered 2022-07-05: 1900 mg via INTRAVENOUS
  Filled 2022-07-05: qty 49.97

## 2022-07-05 MED ORDER — SODIUM CHLORIDE 0.9% FLUSH
10.0000 mL | INTRAVENOUS | Status: DC | PRN
  Administered 2022-07-05: 10 mL

## 2022-07-05 MED ORDER — PACLITAXEL PROTEIN-BOUND CHEMO INJECTION 100 MG
100.0000 mg/m2 | Freq: Once | INTRAVENOUS | Status: AC
  Administered 2022-07-05: 200 mg via INTRAVENOUS
  Filled 2022-07-05: qty 40

## 2022-07-05 MED ORDER — PROCHLORPERAZINE MALEATE 10 MG PO TABS
10.0000 mg | ORAL_TABLET | Freq: Once | ORAL | Status: AC
  Administered 2022-07-05: 10 mg via ORAL
  Filled 2022-07-05: qty 1

## 2022-07-05 MED ORDER — HEPARIN SOD (PORK) LOCK FLUSH 100 UNIT/ML IV SOLN
500.0000 [IU] | Freq: Once | INTRAVENOUS | Status: AC | PRN
  Administered 2022-07-05: 500 [IU]

## 2022-07-05 MED ORDER — SODIUM CHLORIDE 0.9 % IV SOLN: Freq: Once | INTRAVENOUS | Status: AC

## 2022-07-05 NOTE — Progress Notes (Signed)
[facility] OFFICE PROGRESS NOTE   Diagnosis: Pancreas cancer  INTERVAL HISTORY:   Wendy Hoffman returns as scheduled.  She completed another cycle of gemcitabine/Abraxane 06/18/2022.  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  She continues to have upper abdominal pain.  She has been taking more breakthrough pain medication.  She has intermittent nausea and emesis.  No new complaint.  Objective:  Vital signs in last 24 hours:  Blood pressure (!) 150/90, pulse 81, temperature 98.2 F (36.8 C), temperature source Oral, resp. rate 18, height '5\' 4"'  (1.626 m), weight 187 lb (84.8 kg), SpO2 100 %.    HEENT: No thrush or ulcers Resp: Lungs clear bilaterally Cardio: Regular rate and rhythm GI: No hepatosplenomegaly, no mass, tender in the mid and right upper abdomen Vascular: No leg edema   Portacath/PICC-without erythema  Lab Results:  Lab Results  Component Value Date   WBC 5.2 07/05/2022   HGB 9.0 (L) 07/05/2022   HCT 27.8 (L) 07/05/2022   MCV 91.1 07/05/2022   PLT 352 07/05/2022   NEUTROABS 3.3 07/05/2022    CMP  Lab Results  Component Value Date   NA 142 06/18/2022   K 3.9 06/18/2022   CL 105 06/18/2022   CO2 30 06/18/2022   GLUCOSE 85 06/18/2022   BUN 16 06/18/2022   CREATININE 0.95 06/18/2022   CALCIUM 9.6 06/18/2022   PROT 6.7 06/18/2022   ALBUMIN 3.7 06/18/2022   AST 16 06/18/2022   ALT 8 06/18/2022   ALKPHOS 107 06/18/2022   BILITOT 0.4 06/18/2022   GFRNONAA >60 06/18/2022   GFRAA >60 07/21/2020    Lab Results  Component Value Date   CEA1 68.8 (H) 02/23/2021   CEA <0.5 10/15/2008   XBJ478 6,180 (H) 06/18/2022   Medications: I have reviewed the patient's current medications.   Assessment/Plan:  Pancreas cancer -CT abdomen/pelvis without contrast 02/16/2021-multiple large hypoechoic masses in the patient's liver consistent metastatic disease, ill-defined masslike area in the pancreatic head measuring approximate 4.7 cm, possible  filling defect in the right ventricle. -EGD 02/19/2021-large infiltrative mass with bleeding in the second portion of the duodenum, appears to be arising near the ampulla, partially obstructive with friable mucosa.  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 -Flexible sigmoidoscopy 02/19/2021-diverticulosis in the sigmoid colon, nonbleeding external and internal hemorrhoids -Cardiac CT 02/20/2021-mass in the mid RV attached to the interventricular septum measuring 16 mm x 6 mm and concerning for metastatic tumor -MRI abdomen with/without contrast 02/21/2021-mass in the posterior pancreatic head measuring 4.3 x 3.8 cm with obstruction of the central pancreatic duct and diffuse dilatation up to 6 mm consistent with pancreatic adenocarcinoma, liver lesions the largest mass of the central left lobe measuring 9.9 x 9.2 cm consistent with hepatic metastatic disease,  hepatomegaly. -Ultrasound-guided biopsy of a left liver lesion 02/24/2021-adenocarcinoma, morphologically similar to the duodenal biopsy; microsatellite stable, tumor mutation burden 1 -Negative genetic testing -Palliative radiation to the duodenal mass 02/25/2021-03/06/2021 -Cycle 1 FOLFOX 03/17/2021 -Cycle 2 FOLFOX 04/01/2021 -Cycle 3 FOLFIRINOX 04/14/2021 -Cycle 4 FOLFIRINOX 04/27/2021, Udenyca added -Cycle 5 FOLFIRINOX 05/12/2021, Udenyca -MRI abdomen 05/23/2021-decrease in size of pancreas mass and hepatic lesions, no progressive disease -Cycle 6 FOLFIRINOX 05/27/2021 -CT 06/17/2021-multiple liver metastases similar to her prior exam.  Pancreas mass not significantly changed. -CT 06/26/2021-stable pancreas mass, no significant change in size and number of known liver metastases. -CT abdomen/pelvis 07/12/2021-new gastrostomy tube.  Multiple liver masses slightly decreased in size from prior exam.  Pancreas head mass unchanged. -Cycle 1 gemcitabine/Abraxane  07/30/2021 -Cycle 2 gemcitabine/Abraxane 08/13/2021 -Cycle 3 gemcitabine  08/27/2021, Abraxane held due to significant bilateral toe pain, question neuropathy -Cycle 4 gemcitabine, Abraxane held due to neuropathy, 09/10/2021 -Cycle 5 gemcitabine, Abraxane held due to neuropathy, 09/24/2021 -Cycle 6 gemcitabine, Abraxane held due to neuropathy 10/16/2021 -CTs 10/29/2021-mild progression of multifocal liver metastases; mild increase in size of hypoenhancing mass within the head of the pancreas; new mild pleural nodularity along the posterior lower lobes -Cycle 7 gemcitabine/Abraxane 11/03/2021 -Cycle 8 gemcitabine/Abraxane 11/23/2021 -Cycle 9 gemcitabine/Abraxane 12/10/2021 -Cycle 10 Gemcitabine/Abraxane 12/24/2021 -CT abdomen/pelvis 01/04/2022-decrease size of pancreas head mass, no change in multifocal hepatic metastases -Cycle 11 gemcitabine/Abraxane 01/07/2022 -Cycle 12 gemcitabine/Abraxane 02/04/2022 -Cycle 13 Gemcitabine/Abraxane 02/18/2022 -Cycle 14 gemcitabine/Abraxane 03/11/2022 -Cycle 15 gemcitabine/Abraxane 03/29/2022 -Cycle 16 gemcitabine/Abraxane 04/16/2022 -CT abdomen/pelvis 04/22/2022-mixed response in the liver, some lesions larger, some smaller, no change in pancreas head mass, no evidence of metastatic disease elsewhere in the abdomen or pelvis -Cycle 17 gemcitabine/Abraxane 05/17/2022 -Cycle 18 Gemcitabine/Abraxane 06/01/2022 -Cycle 19 gemcitabine/Abraxane 06/18/2022 -Cycle 20 gemcitabine/Abraxane 07/05/2022 2.  Iron deficiency anemia-improved -Feraheme 510 mg 02/20/2021 3.  Protein calorie malnutrition 4.  Possible right ventricular filling defect on noncontrast CT scan MRI cardiac morphology 02/21/2021-mass in the mid RV attached to the interventricular septum measures 16 mm x 6 mm.  Mass appears heterogeneous with area of enhancement on LGE imaging.  Mass is concerning for metastatic tumor.  Normal LV size and systolic function.  Normal RV size and systolic function. 5.  Hypertension 6.  History of CVA 7.  Hyperlipidemia 8.  Hypothyroidism 9.  Morbid  obesity 10.  Asthma 11.  Migraines 12.  Pain secondary to #1 13.  Port-A-Cath placement 02/25/2021 14.  Hypokalemia 04/01/2021-started a potassium supplement 15.  Hospital admission 06/17/2021-intractable nausea and vomiting EGD 06/19/2021-diffuse gastritis, nonobstructing duodenal mass, biopsy of stomach-no malignancy or H. pylori, duodenal mass-adenocarcinoma 16.  Hospital admission 07/12/2021 with nausea/vomiting, abdominal pain, and hypokalemia 17.  Percutaneous gastrostomy tube placement 07/01/2021   Disposition: Wendy Hoffman has pancreas cancer.  She continues treatment with gemcitabine/Abraxane.  She is tolerating treatment well.  Her overall clinical status appears unchanged.  The CA 19-9 has been lower over the past few months.  The plan is to continue gemcitabine/Abraxane.  We will complete another cycle today.  She will return for an office visit and chemotherapy in 2 weeks.  Betsy Coder, MD  07/05/2022  11:59 AM

## 2022-07-05 NOTE — Patient Instructions (Signed)
[facility]   Discharge Instructions: Thank you for choosing [facility] to provide your oncology and hematology care.   If you have a lab appointment with the [facility], please go directly to the [facility] and check in at the registration area.   Wear comfortable clothing and clothing appropriate for easy access to any Portacath or PICC line.   We strive to give you quality time with your provider. You may need to reschedule your appointment if you arrive late (15 or more minutes).  Arriving late affects you and other patients whose appointments are after yours.  Also, if you miss three or more appointments without notifying the office, you may be dismissed from the clinic at the provider's discretion.      For prescription refill requests, have your pharmacy contact our office and allow 72 hours for refills to be completed.    Today you received the following chemotherapy and/or immunotherapy agents Abraxane, Gemzar      To help prevent nausea and vomiting after your treatment, we encourage you to take your nausea medication as directed.  BELOW ARE SYMPTOMS THAT SHOULD BE REPORTED IMMEDIATELY: *FEVER GREATER THAN 100.4 F (38 C) OR HIGHER *CHILLS OR SWEATING *NAUSEA AND VOMITING THAT IS NOT CONTROLLED WITH YOUR NAUSEA MEDICATION *UNUSUAL SHORTNESS OF BREATH *UNUSUAL BRUISING OR BLEEDING *URINARY PROBLEMS (pain or burning when urinating, or frequent urination) *BOWEL PROBLEMS (unusual diarrhea, constipation, pain near the anus) TENDERNESS IN MOUTH AND THROAT WITH OR WITHOUT PRESENCE OF ULCERS (sore throat, sores in mouth, or a toothache) UNUSUAL RASH, SWELLING OR PAIN  UNUSUAL VAGINAL DISCHARGE OR ITCHING   Items with * indicate a potential emergency and should be followed up as soon as possible or go to the Emergency Department if any problems should occur.  Please show the CHEMOTHERAPY ALERT CARD or IMMUNOTHERAPY ALERT CARD at  check-in to the Emergency Department and triage nurse.  Should you have questions after your visit or need to cancel or reschedule your appointment, please contact [facility]  Dept: [phone]  and follow the prompts.  Office hours are 8:00 a.m. to 4:30 p.m. Monday - Friday. Please note that voicemails left after 4:00 p.m. may not be returned until the following business day.  We are closed weekends and major holidays. You have access to a nurse at all times for urgent questions. Please call the main number to the clinic Dept: [phone] and follow the prompts.   For any non-urgent questions, you may also contact your provider using MyChart. We now offer e-Visits for anyone 16 and older to request care online for non-urgent symptoms. For details visit [email or web address].   Also download the MyChart app! Go to the app store, search "MyChart", open the app, select [facility], and log in with your MyChart username and password.  Masks are optional in the cancer centers. If you would like for your care team to wear a mask while they are taking care of you, please let them know. For doctor visits, patients may have with them one support person who is at least 54 years old. At this time, visitors are not allowed in the infusion area.  Nanoparticle Albumin-Bound Paclitaxel injection What is this medication? NANOPARTICLE ALBUMIN-BOUND PACLITAXEL (Na no PAHR ti kuhl  al BYOO muhn-bound  PAK li TAX el) is a chemotherapy drug. It targets fast dividing cells, like cancer cells, and causes these cells to die. This medicine is used to  treat advanced breast cancer, lung cancer, and pancreatic cancer. This medicine may be used for other purposes; ask your health care provider or pharmacist if you have questions. COMMON BRAND NAME(S): Abraxane What should I tell my care team before I take this medication? They need to know if you have any of these conditions: kidney  disease liver disease low blood counts, like low white cell, platelet, or red cell counts lung or breathing disease, like asthma tingling of the fingers or toes, or other nerve disorder an unusual or allergic reaction to paclitaxel, albumin, other chemotherapy, other medicines, foods, dyes, or preservatives pregnant or trying to get pregnant breast-feeding How should I use this medication? This drug is given as an infusion into a vein. It is administered in a hospital or clinic by a specially trained health care professional. Talk to your pediatrician regarding the use of this medicine in children. Special care may be needed. Overdosage: If you think you have taken too much of this medicine contact a poison control center or emergency room at once. NOTE: This medicine is only for you. Do not share this medicine with others. What if I miss a dose? It is important not to miss your dose. Call your doctor or health care professional if you are unable to keep an appointment. What may interact with this medication? This medicine may interact with the following medications: antiviral medicines for hepatitis, HIV or AIDS certain antibiotics like erythromycin and clarithromycin certain medicines for fungal infections like ketoconazole and itraconazole certain medicines for seizures like carbamazepine, phenobarbital, phenytoin gemfibrozil nefazodone rifampin St. John's wort This list may not describe all possible interactions. Give your health care provider a list of all the medicines, herbs, non-prescription drugs, or dietary supplements you use. Also tell them if you smoke, drink alcohol, or use illegal drugs. Some items may interact with your medicine. What should I watch for while using this medication? Your condition will be monitored carefully while you are receiving this medicine. You will need important blood work done while you are taking this medicine. This medicine can cause serious  allergic reactions. If you experience allergic reactions like skin rash, itching or hives, swelling of the face, lips, or tongue, tell your doctor or health care professional right away. In some cases, you may be given additional medicines to help with side effects. Follow all directions for their use. This drug may make you feel generally unwell. This is not uncommon, as chemotherapy can affect healthy cells as well as cancer cells. Report any side effects. Continue your course of treatment even though you feel ill unless your doctor tells you to stop. Call your doctor or health care professional for advice if you get a fever, chills or sore throat, or other symptoms of a cold or flu. Do not treat yourself. This drug decreases your body's ability to fight infections. Try to avoid being around people who are sick. This medicine may increase your risk to bruise or bleed. Call your doctor or health care professional if you notice any unusual bleeding. Be careful brushing and flossing your teeth or using a toothpick because you may get an infection or bleed more easily. If you have any dental work done, tell your dentist you are receiving this medicine. Avoid taking products that contain aspirin, acetaminophen, ibuprofen, naproxen, or ketoprofen unless instructed by your doctor. These medicines may hide a fever. Do not become pregnant while taking this medicine or for 6 months after stopping it. Women should inform  their doctor if they wish to become pregnant or think they might be pregnant. Men should not father a child while taking this medicine or for 3 months after stopping it. There is a potential for serious side effects to an unborn child. Talk to your health care professional or pharmacist for more information. Do not breast-feed an infant while taking this medicine or for 2 weeks after stopping it. This medicine may interfere with the ability to get pregnant or to father a child. You should talk to  your doctor or health care professional if you are concerned about your fertility. What side effects may I notice from receiving this medication? Side effects that you should report to your doctor or health care professional as soon as possible: allergic reactions like skin rash, itching or hives, swelling of the face, lips, or tongue breathing problems changes in vision fast, irregular heartbeat low blood pressure mouth sores pain, tingling, numbness in the hands or feet signs of decreased platelets or bleeding - bruising, pinpoint red spots on the skin, black, tarry stools, blood in the urine signs of decreased red blood cells - unusually weak or tired, feeling faint or lightheaded, falls signs of infection - fever or chills, cough, sore throat, pain or difficulty passing urine signs and symptoms of liver injury like dark yellow or brown urine; general ill feeling or flu-like symptoms; light-colored stools; loss of appetite; nausea; right upper belly pain; unusually weak or tired; yellowing of the eyes or skin swelling of the ankles, feet, hands unusually slow heartbeat Side effects that usually do not require medical attention (report to your doctor or health care professional if they continue or are bothersome): diarrhea hair loss loss of appetite nausea, vomiting tiredness This list may not describe all possible side effects. Call your doctor for medical advice about side effects. You may report side effects to FDA at 1-800-FDA-1088. Where should I keep my medication? This drug is given in a hospital or clinic and will not be stored at home. NOTE: This sheet is a summary. It may not cover all possible information. If you have questions about this medicine, talk to your doctor, pharmacist, or health care provider.  2023 Elsevier/Gold Standard (2017-08-02 00:00:00)  Gemcitabine injection What is this medication? GEMCITABINE (jem SYE ta been) is a chemotherapy drug. This medicine is  used to treat many types of cancer like breast cancer, lung cancer, pancreatic cancer, and ovarian cancer. This medicine may be used for other purposes; ask your health care provider or pharmacist if you have questions. COMMON BRAND NAME(S): Gemzar, Infugem What should I tell my care team before I take this medication? They need to know if you have any of these conditions: blood disorders infection kidney disease liver disease lung or breathing disease, like asthma recent or ongoing radiation therapy an unusual or allergic reaction to gemcitabine, other chemotherapy, other medicines, foods, dyes, or preservatives pregnant or trying to get pregnant breast-feeding How should I use this medication? This drug is given as an infusion into a vein. It is administered in a hospital or clinic by a specially trained health care professional. Talk to your pediatrician regarding the use of this medicine in children. Special care may be needed. Overdosage: If you think you have taken too much of this medicine contact a poison control center or emergency room at once. NOTE: This medicine is only for you. Do not share this medicine with others. What if I miss a dose? It is important not to  miss your dose. Call your doctor or health care professional if you are unable to keep an appointment. What may interact with this medication? medicines to increase blood counts like filgrastim, pegfilgrastim, sargramostim some other chemotherapy drugs like cisplatin vaccines Talk to your doctor or health care professional before taking any of these medicines: acetaminophen aspirin ibuprofen ketoprofen naproxen This list may not describe all possible interactions. Give your health care provider a list of all the medicines, herbs, non-prescription drugs, or dietary supplements you use. Also tell them if you smoke, drink alcohol, or use illegal drugs. Some items may interact with your medicine. What should I watch  for while using this medication? Visit your doctor for checks on your progress. This drug may make you feel generally unwell. This is not uncommon, as chemotherapy can affect healthy cells as well as cancer cells. Report any side effects. Continue your course of treatment even though you feel ill unless your doctor tells you to stop. In some cases, you may be given additional medicines to help with side effects. Follow all directions for their use. Call your doctor or health care professional for advice if you get a fever, chills or sore throat, or other symptoms of a cold or flu. Do not treat yourself. This drug decreases your body's ability to fight infections. Try to avoid being around people who are sick. This medicine may increase your risk to bruise or bleed. Call your doctor or health care professional if you notice any unusual bleeding. Be careful brushing and flossing your teeth or using a toothpick because you may get an infection or bleed more easily. If you have any dental work done, tell your dentist you are receiving this medicine. Avoid taking products that contain aspirin, acetaminophen, ibuprofen, naproxen, or ketoprofen unless instructed by your doctor. These medicines may hide a fever. Do not become pregnant while taking this medicine or for 6 months after stopping it. Women should inform their doctor if they wish to become pregnant or think they might be pregnant. Men should not father a child while taking this medicine and for 3 months after stopping it. There is a potential for serious side effects to an unborn child. Talk to your health care professional or pharmacist for more information. Do not breast-feed an infant while taking this medicine or for at least 1 week after stopping it. Men should inform their doctors if they wish to father a child. This medicine may lower sperm counts. Talk with your doctor or health care professional if you are concerned about your fertility. What  side effects may I notice from receiving this medication? Side effects that you should report to your doctor or health care professional as soon as possible: allergic reactions like skin rash, itching or hives, swelling of the face, lips, or tongue breathing problems pain, redness, or irritation at site where injected signs and symptoms of a dangerous change in heartbeat or heart rhythm like chest pain; dizziness; fast or irregular heartbeat; palpitations; feeling faint or lightheaded, falls; breathing problems signs of decreased platelets or bleeding - bruising, pinpoint red spots on the skin, black, tarry stools, blood in the urine signs of decreased red blood cells - unusually weak or tired, feeling faint or lightheaded, falls signs of infection - fever or chills, cough, sore throat, pain or difficulty passing urine signs and symptoms of kidney injury like trouble passing urine or change in the amount of urine signs and symptoms of liver injury like dark yellow or brown  urine; general ill feeling or flu-like symptoms; light-colored stools; loss of appetite; nausea; right upper belly pain; unusually weak or tired; yellowing of the eyes or skin swelling of ankles, feet, hands Side effects that usually do not require medical attention (report to your doctor or health care professional if they continue or are bothersome): constipation diarrhea hair loss loss of appetite nausea rash vomiting This list may not describe all possible side effects. Call your doctor for medical advice about side effects. You may report side effects to FDA at 1-800-FDA-1088. Where should I keep my medication? This drug is given in a hospital or clinic and will not be stored at home. NOTE: This sheet is a summary. It may not cover all possible information. If you have questions about this medicine, talk to your doctor, pharmacist, or health care provider.  2023 Elsevier/Gold Standard (2018-02-22 00:00:00)

## 2022-07-05 NOTE — Progress Notes (Signed)
Patient seen by Dr. Benay Spice today  Vitals are within treatment parameters. Has lost 9 lb since last visit--  Labs reviewed by Dr. Benay Spice and are within treatment parameters.  Per physician team, patient is ready for treatment and there are NO modifications to the treatment plan.

## 2022-07-05 NOTE — Patient Instructions (Signed)

## 2022-07-06 ENCOUNTER — Other Ambulatory Visit: Payer: Self-pay

## 2022-07-17 ENCOUNTER — Other Ambulatory Visit: Payer: Self-pay

## 2022-07-18 ENCOUNTER — Other Ambulatory Visit: Payer: Self-pay | Admitting: Oncology

## 2022-07-19 ENCOUNTER — Other Ambulatory Visit: Payer: Self-pay | Admitting: Oncology

## 2022-07-19 ENCOUNTER — Inpatient Hospital Stay: Payer: Medicaid Other

## 2022-07-19 ENCOUNTER — Inpatient Hospital Stay: Payer: Medicaid Other | Admitting: Oncology

## 2022-07-19 ENCOUNTER — Other Ambulatory Visit: Payer: Self-pay

## 2022-07-19 DIAGNOSIS — C259 Malignant neoplasm of pancreas, unspecified: Secondary | ICD-10-CM

## 2022-07-19 DIAGNOSIS — C250 Malignant neoplasm of head of pancreas: Secondary | ICD-10-CM

## 2022-07-19 MED ORDER — MORPHINE SULFATE ER 60 MG PO TBCR: 60.0000 mg | EXTENDED_RELEASE_TABLET | Freq: Two times a day (BID) | ORAL | 0 refills | Status: DC

## 2022-07-19 MED ORDER — MORPHINE SULFATE (CONCENTRATE) 10 MG/0.5ML PO SOLN: 10.0000 mg | ORAL | 0 refills | Status: DC | PRN

## 2022-07-19 MED ORDER — OXYCODONE HCL 5 MG PO TABS: 5.0000 mg | ORAL_TABLET | ORAL | 0 refills | Status: DC | PRN

## 2022-07-20 ENCOUNTER — Inpatient Hospital Stay: Payer: Medicaid Other

## 2022-07-20 ENCOUNTER — Inpatient Hospital Stay: Payer: Medicaid Other | Admitting: Nurse Practitioner

## 2022-07-21 ENCOUNTER — Other Ambulatory Visit: Payer: Self-pay

## 2022-07-21 ENCOUNTER — Inpatient Hospital Stay: Payer: Medicaid Other | Attending: Nurse Practitioner

## 2022-07-21 ENCOUNTER — Inpatient Hospital Stay: Payer: Medicaid Other

## 2022-07-21 ENCOUNTER — Encounter: Payer: Self-pay | Admitting: *Deleted

## 2022-07-21 ENCOUNTER — Inpatient Hospital Stay: Payer: Medicaid Other | Admitting: Oncology

## 2022-07-21 ENCOUNTER — Ambulatory Visit (HOSPITAL_BASED_OUTPATIENT_CLINIC_OR_DEPARTMENT_OTHER): Payer: Medicaid Other

## 2022-07-21 ENCOUNTER — Inpatient Hospital Stay (HOSPITAL_BASED_OUTPATIENT_CLINIC_OR_DEPARTMENT_OTHER): Payer: Medicaid Other | Admitting: Oncology

## 2022-07-21 ENCOUNTER — Inpatient Hospital Stay: Payer: Medicaid Other | Admitting: Nutrition

## 2022-07-21 VITALS — BP 125/80 | HR 87 | Temp 98.1°F | Resp 18 | Ht 64.0 in | Wt 197.6 lb

## 2022-07-21 DIAGNOSIS — E46 Unspecified protein-calorie malnutrition: Secondary | ICD-10-CM | POA: Diagnosis not present

## 2022-07-21 DIAGNOSIS — Z8673 Personal history of transient ischemic attack (TIA), and cerebral infarction without residual deficits: Secondary | ICD-10-CM | POA: Insufficient documentation

## 2022-07-21 DIAGNOSIS — Z5111 Encounter for antineoplastic chemotherapy: Secondary | ICD-10-CM | POA: Diagnosis present

## 2022-07-21 DIAGNOSIS — J45909 Unspecified asthma, uncomplicated: Secondary | ICD-10-CM | POA: Insufficient documentation

## 2022-07-21 DIAGNOSIS — E785 Hyperlipidemia, unspecified: Secondary | ICD-10-CM | POA: Diagnosis not present

## 2022-07-21 DIAGNOSIS — G43909 Migraine, unspecified, not intractable, without status migrainosus: Secondary | ICD-10-CM | POA: Diagnosis not present

## 2022-07-21 DIAGNOSIS — C250 Malignant neoplasm of head of pancreas: Secondary | ICD-10-CM

## 2022-07-21 DIAGNOSIS — G629 Polyneuropathy, unspecified: Secondary | ICD-10-CM | POA: Diagnosis not present

## 2022-07-21 DIAGNOSIS — E039 Hypothyroidism, unspecified: Secondary | ICD-10-CM | POA: Insufficient documentation

## 2022-07-21 DIAGNOSIS — E876 Hypokalemia: Secondary | ICD-10-CM | POA: Insufficient documentation

## 2022-07-21 DIAGNOSIS — C787 Secondary malignant neoplasm of liver and intrahepatic bile duct: Secondary | ICD-10-CM | POA: Insufficient documentation

## 2022-07-21 DIAGNOSIS — R600 Localized edema: Secondary | ICD-10-CM | POA: Diagnosis not present

## 2022-07-21 DIAGNOSIS — I10 Essential (primary) hypertension: Secondary | ICD-10-CM | POA: Diagnosis not present

## 2022-07-21 DIAGNOSIS — B379 Candidiasis, unspecified: Secondary | ICD-10-CM | POA: Diagnosis not present

## 2022-07-21 DIAGNOSIS — C259 Malignant neoplasm of pancreas, unspecified: Secondary | ICD-10-CM

## 2022-07-21 LAB — CMP (CANCER CENTER ONLY)
ALT: 13 U/L (ref 0–44)
AST: 15 U/L (ref 15–41)
Albumin: 3.7 g/dL (ref 3.5–5.0)
Alkaline Phosphatase: 101 U/L (ref 38–126)
Anion gap: 7 (ref 5–15)
BUN: 16 mg/dL (ref 6–20)
CO2: 29 mmol/L (ref 22–32)
Calcium: 9 mg/dL (ref 8.9–10.3)
Chloride: 104 mmol/L (ref 98–111)
Creatinine: 0.97 mg/dL (ref 0.44–1.00)
GFR, Estimated: >60 mL/min (ref >60)
Glucose, Bld: 104 mg/dL — ABNORMAL HIGH (ref 70–99)
Potassium: 4.1 mmol/L (ref 3.5–5.1)
Sodium: 140 mmol/L (ref 135–145)
Total Bilirubin: 0.4 mg/dL (ref 0.3–1.2)
Total Protein: 6.5 g/dL (ref 6.5–8.1)

## 2022-07-21 LAB — CBC WITH DIFFERENTIAL (CANCER CENTER ONLY)
Abs Immature Granulocytes: 0.01 10*3/uL (ref 0.00–0.07)
Basophils Absolute: 0 10*3/uL (ref 0.0–0.1)
Basophils Relative: 1 %
Eosinophils Absolute: 0.3 10*3/uL (ref 0.0–0.5)
Eosinophils Relative: 8 %
HCT: 28.7 % — ABNORMAL LOW (ref 36.0–46.0)
Hemoglobin: 9 g/dL — ABNORMAL LOW (ref 12.0–15.0)
Immature Granulocytes: 0 %
Lymphocytes Relative: 23 %
Lymphs Abs: 0.9 10*3/uL (ref 0.7–4.0)
MCH: 28.5 pg (ref 26.0–34.0)
MCHC: 31.4 g/dL (ref 30.0–36.0)
MCV: 90.8 fL (ref 80.0–100.0)
Monocytes Absolute: 0.4 10*3/uL (ref 0.1–1.0)
Monocytes Relative: 9 %
Neutro Abs: 2.5 10*3/uL (ref 1.7–7.7)
Neutrophils Relative %: 59 %
Platelet Count: 229 10*3/uL (ref 150–400)
RBC: 3.16 MIL/uL — ABNORMAL LOW (ref 3.87–5.11)
RDW: 18.1 % — ABNORMAL HIGH (ref 11.5–15.5)
WBC Count: 4.1 10*3/uL (ref 4.0–10.5)
nRBC: 0 % (ref 0.0–0.2)

## 2022-07-21 MED ORDER — FLUCONAZOLE 100 MG PO TABS: ORAL_TABLET | ORAL | 1 refills | Status: DC

## 2022-07-21 MED ORDER — ONDANSETRON 4 MG PO TBDP: 4.0000 mg | ORAL_TABLET | Freq: Three times a day (TID) | ORAL | 3 refills | Status: DC | PRN

## 2022-07-21 NOTE — Progress Notes (Signed)
Wendy Hoffman OFFICE PROGRESS NOTE   Diagnosis: Pancreas cancer  INTERVAL HISTORY:   Wendy Hoffman complete another cycle of gemcitabine/Abraxane on 07/05/2022.  No fever or rash.  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  She continues MS Contin and Roxanol for abdominal pain. She reports an episode of left anterior chest pain approximately 1 week ago.  The pain was sharp and "stabbing "and lasted for minutes.  She had the pain intermittently for approximately 3 days.  She had dyspnea.  No fever or cough.  No leg swelling or pain.  The pain resolved and has not returned.  Objective:  Vital signs in last 24 hours:  Blood pressure 125/80, pulse 87, temperature 98.1 F (36.7 C), temperature source Oral, resp. rate 18, height '5\' 4"'  (1.626 m), weight 197 lb 9.6 oz (89.6 kg), SpO2 100 %.    HEENT: White coating to the tongue, no buccal thrush or ulcers Resp: Good air movement bilaterally, mild inspiratory/expiratory wheeze bilaterally, no respiratory distress Cardio: Regular rate and rhythm GI: No tender in the mid and right upper abdomen.  Firm fullness in the mid upper abdomen Vascular: No leg edema, erythema, or tenderness.  The left lower leg is slightly larger than the right side  Musculoskeletal: No tenderness at the left anterior chest wall  Portacath/PICC-without erythema  Lab Results:  Lab Results  Component Value Date   WBC 4.1 07/21/2022   HGB 9.0 (L) 07/21/2022   HCT 28.7 (L) 07/21/2022   MCV 90.8 07/21/2022   PLT 229 07/21/2022   NEUTROABS 2.5 07/21/2022    CMP  Lab Results  Component Value Date   NA 140 07/21/2022   K 4.1 07/21/2022   CL 104 07/21/2022   CO2 29 07/21/2022   GLUCOSE 104 (H) 07/21/2022   BUN 16 07/21/2022   CREATININE 0.97 07/21/2022   CALCIUM 9.0 07/21/2022   PROT 6.5 07/21/2022   ALBUMIN 3.7 07/21/2022   AST 15 07/21/2022   ALT 13 07/21/2022   ALKPHOS 101 07/21/2022   BILITOT 0.4 07/21/2022   GFRNONAA >60 07/21/2022   GFRAA >60  07/21/2020    Lab Results  Component Value Date   CEA1 68.8 (H) 02/23/2021   CEA <0.5 10/15/2008   TGG269 6,180 (H) 06/18/2022     Medications: I have reviewed the patient's current medications.   Assessment/Plan: Pancreas cancer -CT abdomen/pelvis without contrast 02/16/2021-multiple large hypoechoic masses in the patient's liver consistent metastatic disease, ill-defined masslike area in the pancreatic head measuring approximate 4.7 cm, possible filling defect in the right ventricle. -EGD 02/19/2021-large infiltrative mass with bleeding in the second portion of the duodenum, appears to be arising near the ampulla, partially obstructive with friable mucosa.  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 -Flexible sigmoidoscopy 02/19/2021-diverticulosis in the sigmoid colon, nonbleeding external and internal hemorrhoids -Cardiac CT 02/20/2021-mass in the mid RV attached to the interventricular septum measuring 16 mm x 6 mm and concerning for metastatic tumor -MRI abdomen with/without contrast 02/21/2021-mass in the posterior pancreatic head measuring 4.3 x 3.8 cm with obstruction of the central pancreatic duct and diffuse dilatation up to 6 mm consistent with pancreatic adenocarcinoma, liver lesions the largest mass of the central left lobe measuring 9.9 x 9.2 cm consistent with hepatic metastatic disease,  hepatomegaly. -Ultrasound-guided biopsy of a left liver lesion 02/24/2021-adenocarcinoma, morphologically similar to the duodenal biopsy; microsatellite stable, tumor mutation burden 1 -Negative genetic testing -Palliative radiation to the duodenal mass 02/25/2021-03/06/2021 -Cycle 1 FOLFOX 03/17/2021 -Cycle 2 FOLFOX 04/01/2021 -Cycle 3  FOLFIRINOX 04/14/2021 -Cycle 4 FOLFIRINOX 04/27/2021, Udenyca added -Cycle 5 FOLFIRINOX 05/12/2021, Udenyca -MRI abdomen 05/23/2021-decrease in size of pancreas mass and hepatic lesions, no progressive disease -Cycle 6 FOLFIRINOX  05/27/2021 -CT 06/17/2021-multiple liver metastases similar to her prior exam.  Pancreas mass not significantly changed. -CT 06/26/2021-stable pancreas mass, no significant change in size and number of known liver metastases. -CT abdomen/pelvis 07/12/2021-new gastrostomy tube.  Multiple liver masses slightly decreased in size from prior exam.  Pancreas head mass unchanged. -Cycle 1 gemcitabine/Abraxane 07/30/2021 -Cycle 2 gemcitabine/Abraxane 08/13/2021 -Cycle 3 gemcitabine 08/27/2021, Abraxane held due to significant bilateral toe pain, question neuropathy -Cycle 4 gemcitabine, Abraxane held due to neuropathy, 09/10/2021 -Cycle 5 gemcitabine, Abraxane held due to neuropathy, 09/24/2021 -Cycle 6 gemcitabine, Abraxane held due to neuropathy 10/16/2021 -CTs 10/29/2021-mild progression of multifocal liver metastases; mild increase in size of hypoenhancing mass within the head of the pancreas; new mild pleural nodularity along the posterior lower lobes -Cycle 7 gemcitabine/Abraxane 11/03/2021 -Cycle 8 gemcitabine/Abraxane 11/23/2021 -Cycle 9 gemcitabine/Abraxane 12/10/2021 -Cycle 10 Gemcitabine/Abraxane 12/24/2021 -CT abdomen/pelvis 01/04/2022-decrease size of pancreas head mass, no change in multifocal hepatic metastases -Cycle 11 gemcitabine/Abraxane 01/07/2022 -Cycle 12 gemcitabine/Abraxane 02/04/2022 -Cycle 13 Gemcitabine/Abraxane 02/18/2022 -Cycle 14 gemcitabine/Abraxane 03/11/2022 -Cycle 15 gemcitabine/Abraxane 03/29/2022 -Cycle 16 gemcitabine/Abraxane 04/16/2022 -CT abdomen/pelvis 04/22/2022-mixed response in the liver, some lesions larger, some smaller, no change in pancreas head mass, no evidence of metastatic disease elsewhere in the abdomen or pelvis -Cycle 17 gemcitabine/Abraxane 05/17/2022 -Cycle 18 Gemcitabine/Abraxane 06/01/2022 -Cycle 19 gemcitabine/Abraxane 06/18/2022 -Cycle 20 gemcitabine/Abraxane 07/05/2022 2.  Iron deficiency anemia-improved -Feraheme 510 mg 02/20/2021 3.  Protein calorie  malnutrition 4.  Possible right ventricular filling defect on noncontrast CT scan MRI cardiac morphology 02/21/2021-mass in the mid RV attached to the interventricular septum measures 16 mm x 6 mm.  Mass appears heterogeneous with area of enhancement on LGE imaging.  Mass is concerning for metastatic tumor.  Normal LV size and systolic function.  Normal RV size and systolic function. 5.  Hypertension 6.  History of CVA 7.  Hyperlipidemia 8.  Hypothyroidism 9.  Morbid obesity 10.  Asthma 11.  Migraines 12.  Pain secondary to #1 13.  Port-A-Cath placement 02/25/2021 14.  Hypokalemia 04/01/2021-started a potassium supplement 15.  Hospital admission 06/17/2021-intractable nausea and vomiting EGD 06/19/2021-diffuse gastritis, nonobstructing duodenal mass, biopsy of stomach-no malignancy or H. pylori, duodenal mass-adenocarcinoma 16.  Hospital admission 07/12/2021 with nausea/vomiting, abdominal pain, and hypokalemia 17.  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  She continues treatment with gemcitabine/Abraxane.  We will follow-up on the CA 19-9 from today.  She will undergo restaging a restaging CT after the next cycle of chemotherapy.  She had intermittent pleuritic left-sided chest pain last week.  The pain resolved after 3 days.  I have a low clinical suspicion for pulmonary embolism, but she is at increased risk for pulmonary malaise in the setting of metastatic pancreas cancer.  There was also a question of a right ventricular mass on imaging last year.  The left lower leg is slightly enlarged compared to the right side today.  We will refer her for bilateral lower extremity Dopplers.  Wendy Hoffman will complete another cycle of gemcitabine/Abraxane today.  Betsy Coder, MD  07/21/2022  11:29 AM

## 2022-07-21 NOTE — Progress Notes (Signed)
Patient seen by Dr. Sherrill today ? ?Vitals are within treatment parameters. ? ?Labs reviewed by Dr. Sherrill and are within treatment parameters. ? ?Per physician team, patient is ready for treatment and there are NO modifications to the treatment plan.  ?

## 2022-07-21 NOTE — Addendum Note (Signed)
Addended by: Lenox Ponds E on: 07/21/2022 11:55 AM   Modules accepted: Orders

## 2022-07-21 NOTE — Progress Notes (Signed)
Patient refused treatment to today and requested to reschedule to Friday at 0900. She stated that she received a call that her 54 year old daughter fell at the park and may have broken her arm and is being transported to the ED. Dr. Benay Spice and Merceda Elks, RN made aware. Port  a cath [employer]. Infusion appointment rescheduled to Friday 07/23/2022 at 0900.

## 2022-07-22 ENCOUNTER — Other Ambulatory Visit: Payer: Self-pay

## 2022-07-22 LAB — CANCER ANTIGEN 19-9: CA 19-9: 6778 U/mL — ABNORMAL HIGH (ref 0–35)

## 2022-07-23 ENCOUNTER — Telehealth: Payer: Self-pay

## 2022-07-23 ENCOUNTER — Inpatient Hospital Stay: Payer: Medicaid Other

## 2022-07-23 NOTE — Telephone Encounter (Signed)
TC to Pt inquiring about missed appointment. Pt states her cab driver did not show up to pick her up. Informed Pt that I would give her the number to our transportation services because she has missed several appointments because of her current company she is using. Pt transferred to scheduling where appointment will be rescheduled and transportation number given to Pt.

## 2022-07-24 ENCOUNTER — Other Ambulatory Visit: Payer: Self-pay

## 2022-07-25 ENCOUNTER — Other Ambulatory Visit: Payer: Self-pay | Admitting: Oncology

## 2022-07-25 DIAGNOSIS — C250 Malignant neoplasm of head of pancreas: Secondary | ICD-10-CM

## 2022-07-26 ENCOUNTER — Inpatient Hospital Stay: Payer: Medicaid Other

## 2022-07-27 ENCOUNTER — Ambulatory Visit (HOSPITAL_BASED_OUTPATIENT_CLINIC_OR_DEPARTMENT_OTHER): Admission: RE | Admit: 2022-07-27 | Payer: Medicaid Other | Source: Ambulatory Visit

## 2022-07-28 ENCOUNTER — Telehealth: Payer: Self-pay

## 2022-07-28 NOTE — Telephone Encounter (Addendum)
V/M message from Pt stating she was exposed to covid while at her son's house and she is doing a Theme park manager.TC to Pt left v/m to return call to the office.

## 2022-07-29 ENCOUNTER — Telehealth: Payer: Self-pay | Admitting: *Deleted

## 2022-07-29 NOTE — Telephone Encounter (Signed)
Called Wendy Hoffman to follow up on her missed CT angio scheduled on 07/26/22. She reports going to visit her son, girlfriend who recently had baby over past weekend and found out they all tested positive for COVID. She tested negative on 8/15, but has remained home since. Plans to re-test on Sunday. She does plan to keep her appointment on 8/28 for OV and treatment. Reports her dyspnea is at her baseline and has not further episodes of chest pain.

## 2022-07-30 ENCOUNTER — Other Ambulatory Visit: Payer: Self-pay | Admitting: Oncology

## 2022-07-30 DIAGNOSIS — C250 Malignant neoplasm of head of pancreas: Secondary | ICD-10-CM

## 2022-08-02 ENCOUNTER — Ambulatory Visit: Payer: Medicaid Other | Admitting: Nurse Practitioner

## 2022-08-02 ENCOUNTER — Other Ambulatory Visit: Payer: Medicaid Other

## 2022-08-02 ENCOUNTER — Inpatient Hospital Stay: Payer: Medicaid Other | Admitting: Nurse Practitioner

## 2022-08-02 ENCOUNTER — Ambulatory Visit: Payer: Medicaid Other

## 2022-08-02 ENCOUNTER — Inpatient Hospital Stay: Payer: Medicaid Other

## 2022-08-09 ENCOUNTER — Encounter: Payer: Self-pay | Admitting: Nurse Practitioner

## 2022-08-09 ENCOUNTER — Inpatient Hospital Stay (HOSPITAL_BASED_OUTPATIENT_CLINIC_OR_DEPARTMENT_OTHER): Payer: Medicaid Other | Admitting: Nurse Practitioner

## 2022-08-09 ENCOUNTER — Inpatient Hospital Stay: Payer: Medicaid Other

## 2022-08-09 VITALS — BP 142/93 | HR 85 | Temp 98.1°F | Resp 16 | Wt 206.1 lb

## 2022-08-09 VITALS — BP 169/96 | HR 77

## 2022-08-09 DIAGNOSIS — Z5111 Encounter for antineoplastic chemotherapy: Secondary | ICD-10-CM | POA: Diagnosis not present

## 2022-08-09 DIAGNOSIS — C250 Malignant neoplasm of head of pancreas: Secondary | ICD-10-CM | POA: Diagnosis not present

## 2022-08-09 LAB — CBC WITH DIFFERENTIAL (CANCER CENTER ONLY)
Abs Immature Granulocytes: 0.03 10*3/uL (ref 0.00–0.07)
Basophils Absolute: 0 10*3/uL (ref 0.0–0.1)
Basophils Relative: 1 %
Eosinophils Absolute: 0.3 10*3/uL (ref 0.0–0.5)
Eosinophils Relative: 5 %
HCT: 27.2 % — ABNORMAL LOW (ref 36.0–46.0)
Hemoglobin: 8.7 g/dL — ABNORMAL LOW (ref 12.0–15.0)
Immature Granulocytes: 1 %
Lymphocytes Relative: 22 %
Lymphs Abs: 1.3 10*3/uL (ref 0.7–4.0)
MCH: 28.8 pg (ref 26.0–34.0)
MCHC: 32 g/dL (ref 30.0–36.0)
MCV: 90.1 fL (ref 80.0–100.0)
Monocytes Absolute: 0.6 10*3/uL (ref 0.1–1.0)
Monocytes Relative: 10 %
Neutro Abs: 3.7 10*3/uL (ref 1.7–7.7)
Neutrophils Relative %: 61 %
Platelet Count: 315 10*3/uL (ref 150–400)
RBC: 3.02 MIL/uL — ABNORMAL LOW (ref 3.87–5.11)
RDW: 18.6 % — ABNORMAL HIGH (ref 11.5–15.5)
WBC Count: 5.9 10*3/uL (ref 4.0–10.5)
nRBC: 0 % (ref 0.0–0.2)

## 2022-08-09 LAB — CMP (CANCER CENTER ONLY)
ALT: 24 U/L (ref 0–44)
AST: 18 U/L (ref 15–41)
Albumin: 3.3 g/dL — ABNORMAL LOW (ref 3.5–5.0)
Alkaline Phosphatase: 176 U/L — ABNORMAL HIGH (ref 38–126)
Anion gap: 8 (ref 5–15)
BUN: 14 mg/dL (ref 6–20)
CO2: 29 mmol/L (ref 22–32)
Calcium: 9 mg/dL (ref 8.9–10.3)
Chloride: 101 mmol/L (ref 98–111)
Creatinine: 0.98 mg/dL (ref 0.44–1.00)
GFR, Estimated: >60 mL/min (ref >60)
Glucose, Bld: 107 mg/dL — ABNORMAL HIGH (ref 70–99)
Potassium: 3.4 mmol/L — ABNORMAL LOW (ref 3.5–5.1)
Sodium: 138 mmol/L (ref 135–145)
Total Bilirubin: 0.4 mg/dL (ref 0.3–1.2)
Total Protein: 6.8 g/dL (ref 6.5–8.1)

## 2022-08-09 MED ORDER — HEPARIN SOD (PORK) LOCK FLUSH 100 UNIT/ML IV SOLN
500.0000 [IU] | Freq: Once | INTRAVENOUS | Status: AC | PRN
  Administered 2022-08-09: 500 [IU]

## 2022-08-09 MED ORDER — SODIUM CHLORIDE 0.9 % IV SOLN: Freq: Once | INTRAVENOUS | Status: AC

## 2022-08-09 MED ORDER — PACLITAXEL PROTEIN-BOUND CHEMO INJECTION 100 MG
100.0000 mg/m2 | Freq: Once | INTRAVENOUS | Status: AC
  Administered 2022-08-09: 200 mg via INTRAVENOUS
  Filled 2022-08-09: qty 40

## 2022-08-09 MED ORDER — SODIUM CHLORIDE 0.9 % IV SOLN
1000.0000 mg/m2 | Freq: Once | INTRAVENOUS | Status: AC
  Administered 2022-08-09: 1900 mg via INTRAVENOUS
  Filled 2022-08-09: qty 49.97

## 2022-08-09 MED ORDER — SODIUM CHLORIDE 0.9% FLUSH
10.0000 mL | INTRAVENOUS | Status: DC | PRN
  Administered 2022-08-09: 10 mL

## 2022-08-09 MED ORDER — PROCHLORPERAZINE MALEATE 10 MG PO TABS
10.0000 mg | ORAL_TABLET | Freq: Once | ORAL | Status: AC
  Administered 2022-08-09: 10 mg via ORAL
  Filled 2022-08-09: qty 1

## 2022-08-09 NOTE — Patient Instructions (Signed)
[facility]   Discharge Instructions: Thank you for choosing [facility] to provide your oncology and hematology care.   If you have a lab appointment with the [facility], please go directly to the [facility] and check in at the registration area.   Wear comfortable clothing and clothing appropriate for easy access to any Portacath or PICC line.   We strive to give you quality time with your provider. You may need to reschedule your appointment if you arrive late (15 or more minutes).  Arriving late affects you and other patients whose appointments are after yours.  Also, if you miss three or more appointments without notifying the office, you may be dismissed from the clinic at the provider's discretion.      For prescription refill requests, have your pharmacy contact our office and allow 72 hours for refills to be completed.    Today you received the following chemotherapy and/or immunotherapy agents Paclitaxel-protein bound (ABRAXANE) & Gemcitabine (GEMZAR).      To help prevent nausea and vomiting after your treatment, we encourage you to take your nausea medication as directed.  BELOW ARE SYMPTOMS THAT SHOULD BE REPORTED IMMEDIATELY: *FEVER GREATER THAN 100.4 F (38 C) OR HIGHER *CHILLS OR SWEATING *NAUSEA AND VOMITING THAT IS NOT CONTROLLED WITH YOUR NAUSEA MEDICATION *UNUSUAL SHORTNESS OF BREATH *UNUSUAL BRUISING OR BLEEDING *URINARY PROBLEMS (pain or burning when urinating, or frequent urination) *BOWEL PROBLEMS (unusual diarrhea, constipation, pain near the anus) TENDERNESS IN MOUTH AND THROAT WITH OR WITHOUT PRESENCE OF ULCERS (sore throat, sores in mouth, or a toothache) UNUSUAL RASH, SWELLING OR PAIN  UNUSUAL VAGINAL DISCHARGE OR ITCHING   Items with * indicate a potential emergency and should be followed up as soon as possible or go to the Emergency Department if any problems should occur.  Please show the CHEMOTHERAPY ALERT  CARD or IMMUNOTHERAPY ALERT CARD at check-in to the Emergency Department and triage nurse.  Should you have questions after your visit or need to cancel or reschedule your appointment, please contact [facility]  Dept: [phone]  and follow the prompts.  Office hours are 8:00 a.m. to 4:30 p.m. Monday - Friday. Please note that voicemails left after 4:00 p.m. may not be returned until the following business day.  We are closed weekends and major holidays. You have access to a nurse at all times for urgent questions. Please call the main number to the clinic Dept: [phone] and follow the prompts.   For any non-urgent questions, you may also contact your provider using MyChart. We now offer e-Visits for anyone 60 and older to request care online for non-urgent symptoms. For details visit [email or web address].   Also download the MyChart app! Go to the app store, search "MyChart", open the app, select [facility], and log in with your MyChart username and password.  Masks are optional in the cancer centers. If you would like for your care team to wear a mask while they are taking care of you, please let them know. You may have one support person who is at least 54 years old accompany you for your appointments.  Paclitaxel Nanoparticle Albumin-Bound Injection What is this medication? NANOPARTICLE ALBUMIN-BOUND PACLITAXEL (Na no PAHR ti kuhl al BYOO muhn-bound PAK li TAX el) treats some types of cancer. It works by slowing down the growth of cancer cells. This medicine may be used for other purposes; ask your health care provider or pharmacist if you  have questions. COMMON BRAND NAME(S): Abraxane What should I tell my care team before I take this medication? They need to know if you have any of these conditions: Liver disease Low white blood cell levels An unusual or allergic reaction to paclitaxel, albumin, other medications, foods, dyes, or preservatives If you  or your partner are pregnant or trying to get pregnant Breast-feeding How should I use this medication? This medication is injected into a vein. It is given by your care team in a hospital or clinic setting. Talk to your care team about the use of this medication in children. Special care may be needed. Overdosage: If you think you have taken too much of this medicine contact a poison control center or emergency room at once. NOTE: This medicine is only for you. Do not share this medicine with others. What if I miss a dose? Keep appointments for follow-up doses. It is important not to miss your dose. Call your care team if you are unable to keep an appointment. What may interact with this medication? Other medications may affect the way this medication works. Talk with your care team about all of the medications you take. They may suggest changes to your treatment plan to lower the risk of side effects and to make sure your medications work as intended. This list may not describe all possible interactions. Give your health care provider a list of all the medicines, herbs, non-prescription drugs, or dietary supplements you use. Also tell them if you smoke, drink alcohol, or use illegal drugs. Some items may interact with your medicine. What should I watch for while using this medication? Your condition will be monitored carefully while you are receiving this medication. You may need blood work while taking this medication. This medication may make you feel generally unwell. This is not uncommon as chemotherapy can affect healthy cells as well as cancer cells. Report any side effects. Continue your course of treatment even though you feel ill unless your care team tells you to stop. This medication can cause serious allergic reactions. To reduce the risk, your care team may give you other medications to take before receiving this one. Be sure to follow the directions from your care team. This  medication may increase your risk of getting an infection. Call your care team for advice if you get a fever, chills, sore throat, or other symptoms of a cold or flu. Do not treat yourself. Try to avoid being around people who are sick. This medication may increase your risk to bruise or bleed. Call your care team if you notice any unusual bleeding. Be careful brushing or flossing your teeth or using a toothpick because you may get an infection or bleed more easily. If you have any dental work done, tell your dentist you are receiving this medication. Talk to your care team if you or your partner may be pregnant. Serious birth defects can occur if you take this medication during pregnancy and for 6 months after the last dose. You will need a negative pregnancy test before starting this medication. Contraception is recommended while taking this medication and for 6 months after the last dose. Your care team can help you find the option that works for you. If your partner can get pregnant, use a condom during sex while taking this medication and for 3 months after the last dose. Do not breastfeed while taking this medication and for 2 weeks after the last dose. This medication may cause infertility. Talk  to your care team if you are concerned about your fertility. What side effects may I notice from receiving this medication? Side effects that you should report to your care team as soon as possible: Allergic reactions--skin rash, itching, hives, swelling of the face, lips, tongue, or throat Dry cough, shortness of breath or trouble breathing Infection--fever, chills, cough, sore throat, wounds that don't heal, pain or trouble when passing urine, general feeling of discomfort or being unwell Low red blood cell level--unusual weakness or fatigue, dizziness, headache, trouble breathing Pain, tingling, or numbness in the hands or feet Stomach pain, unusual weakness or fatigue, nausea, vomiting, diarrhea, or  fever that lasts longer than expected Unusual bruising or bleeding Side effects that usually do not require medical attention (report to your care team if they continue or are bothersome): Diarrhea Fatigue Hair loss Loss of appetite Nausea Vomiting This list may not describe all possible side effects. Call your doctor for medical advice about side effects. You may report side effects to FDA at 1-800-FDA-1088. Where should I keep my medication? This medication is given in a hospital or clinic. It will not be stored at home. NOTE: This sheet is a summary. It may not cover all possible information. If you have questions about this medicine, talk to your doctor, pharmacist, or health care provider.  2023 Elsevier/Gold Standard (2022-03-31 00:00:00)  Gemcitabine Injection What is this medication? GEMCITABINE (jem SYE ta been) treats some types of cancer. It works by slowing down the growth of cancer cells. This medicine may be used for other purposes; ask your health care provider or pharmacist if you have questions. COMMON BRAND NAME(S): Gemzar, Infugem What should I tell my care team before I take this medication? They need to know if you have any of these conditions: Blood disorders Infection Kidney disease Liver disease Lung or breathing disease, such as asthma or COPD Recent or ongoing radiation therapy An unusual or allergic reaction to gemcitabine, other medications, foods, dyes, or preservatives If you or your partner are pregnant or trying to get pregnant Breast-feeding How should I use this medication? This medication is injected into a vein. It is given by your care team in a hospital or clinic setting. Talk to your care team about the use of this medication in children. Special care may be needed. Overdosage: If you think you have taken too much of this medicine contact a poison control center or emergency room at once. NOTE: This medicine is only for you. Do not share this  medicine with others. What if I miss a dose? Keep appointments for follow-up doses. It is important not to miss your dose. Call your care team if you are unable to keep an appointment. What may interact with this medication? Interactions have not been studied. This list may not describe all possible interactions. Give your health care provider a list of all the medicines, herbs, non-prescription drugs, or dietary supplements you use. Also tell them if you smoke, drink alcohol, or use illegal drugs. Some items may interact with your medicine. What should I watch for while using this medication? Your condition will be monitored carefully while you are receiving this medication. This medication may make you feel generally unwell. This is not uncommon, as chemotherapy can affect healthy cells as well as cancer cells. Report any side effects. Continue your course of treatment even though you feel ill unless your care team tells you to stop. In some cases, you may be given additional medications to help  with side effects. Follow all directions for their use. This medication may increase your risk of getting an infection. Call your care team for advice if you get a fever, chills, sore throat, or other symptoms of a cold or flu. Do not treat yourself. Try to avoid being around people who are sick. This medication may increase your risk to bruise or bleed. Call your care team if you notice any unusual bleeding. Be careful brushing or flossing your teeth or using a toothpick because you may get an infection or bleed more easily. If you have any dental work done, tell your dentist you are receiving this medication. Avoid taking medications that contain aspirin, acetaminophen, ibuprofen, naproxen, or ketoprofen unless instructed by your care team. These medications may hide a fever. Talk to your care team if you or your partner wish to become pregnant or think you might be pregnant. This medication can cause  serious birth defects if taken during pregnancy and for 6 months after the last dose. A negative pregnancy test is required before starting this medication. A reliable form of contraception is recommended while taking this medication and for 6 months after the last dose. Talk to your care team about effective forms of contraception. Do not father a child while taking this medication and for 3 months after the last dose. Use a condom while having sex during this time period. Do not breastfeed while taking this medication and for at least 1 week after the last dose. This medication may cause infertility. Talk to your care team if you are concerned about your fertility. What side effects may I notice from receiving this medication? Side effects that you should report to your care team as soon as possible: Allergic reactions--skin rash, itching, hives, swelling of the face, lips, tongue, or throat Capillary leak syndrome--stomach or muscle pain, unusual weakness or fatigue, feeling faint or lightheaded, decrease in the amount of urine, swelling of the ankles, hands, or feet, trouble breathing Infection--fever, chills, cough, sore throat, wounds that don't heal, pain or trouble when passing urine, general feeling of discomfort or being unwell Liver injury--right upper belly pain, loss of appetite, nausea, light-colored stool, dark yellow or brown urine, yellowing skin or eyes, unusual weakness or fatigue Low red blood cell level--unusual weakness or fatigue, dizziness, headache, trouble breathing Lung injury--shortness of breath or trouble breathing, cough, spitting up blood, chest pain, fever Stomach pain, bloody diarrhea, pale skin, unusual weakness or fatigue, decrease in the amount of urine, which may be signs of hemolytic uremic syndrome Sudden and severe headache, confusion, change in vision, seizures, which may be signs of posterior reversible encephalopathy syndrome (PRES) Unusual bruising or  bleeding Side effects that usually do not require medical attention (report to your care team if they continue or are bothersome): Diarrhea Drowsiness Hair loss Nausea Pain, redness, or swelling with sores inside the mouth or throat Vomiting This list may not describe all possible side effects. Call your doctor for medical advice about side effects. You may report side effects to FDA at 1-800-FDA-1088. Where should I keep my medication? This medication is given in a hospital or clinic. It will not be stored at home. NOTE: This sheet is a summary. It may not cover all possible information. If you have questions about this medicine, talk to your doctor, pharmacist, or health care provider.  2023 Elsevier/Gold Standard (2022-03-31 00:00:00)

## 2022-08-09 NOTE — Progress Notes (Signed)
[place] OFFICE PROGRESS NOTE   Diagnosis: Pancreas cancer  INTERVAL HISTORY:   Wendy Hoffman returns for follow-up.  She completed a cycle of gemcitabine/Abraxane 07/05/2022.  She missed several follow-up appointments.  Complaint is fatigue.  No nausea or vomiting.  No mouth sores.  No diarrhea.  She has more pain around the mid back region.  She is taking MS Contin with oxycodone as needed.  No shortness of breath.  Stable neuropathy symptoms.  Objective:  Vital signs in last 24 hours:  Blood pressure (!) 142/93, pulse 85, temperature 98.1 F (36.7 C), temperature source Tympanic, resp. rate 16, weight 206 lb 1.6 oz (93.5 kg), SpO2 100 %.    HEENT: Mild thrush at the posterior palate.  No ulcers. Resp: Lungs clear bilaterally. Cardio: Regular rate and rhythm. GI: Tender epigastric region.  No hepatomegaly.  No mass. Vascular: No leg edema. Neuro: Alert and oriented. Port-A-Cath without erythema.  Lab Results:  Lab Results  Component Value Date   WBC 5.9 08/09/2022   HGB 8.7 (L) 08/09/2022   HCT 27.2 (L) 08/09/2022   MCV 90.1 08/09/2022   PLT 315 08/09/2022   NEUTROABS 3.7 08/09/2022    Imaging:  No results found.  Medications: I have reviewed the patient's current medications.  Assessment/Plan: Pancreas cancer -CT abdomen/pelvis without contrast 02/16/2021-multiple large hypoechoic masses in the patient's liver consistent metastatic disease, ill-defined masslike area in the pancreatic head measuring approximate 4.7 cm, possible filling defect in the right ventricle. -EGD 02/19/2021-large infiltrative mass with bleeding in the second portion of the duodenum, appears to be arising near the ampulla, partially obstructive with friable mucosa.  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 -Flexible sigmoidoscopy 02/19/2021-diverticulosis in the sigmoid colon, nonbleeding external and internal hemorrhoids -Cardiac CT 02/20/2021-mass  in the mid RV attached to the interventricular septum measuring 16 mm x 6 mm and concerning for metastatic tumor -MRI abdomen with/without contrast 02/21/2021-mass in the posterior pancreatic head measuring 4.3 x 3.8 cm with obstruction of the central pancreatic duct and diffuse dilatation up to 6 mm consistent with pancreatic adenocarcinoma, liver lesions the largest mass of the central left lobe measuring 9.9 x 9.2 cm consistent with hepatic metastatic disease,  hepatomegaly. -Ultrasound-guided biopsy of a left liver lesion 02/24/2021-adenocarcinoma, morphologically similar to the duodenal biopsy; microsatellite stable, tumor mutation burden 1 -Negative genetic testing -Palliative radiation to the duodenal mass 02/25/2021-03/06/2021 -Cycle 1 FOLFOX 03/17/2021 -Cycle 2 FOLFOX 04/01/2021 -Cycle 3 FOLFIRINOX 04/14/2021 -Cycle 4 FOLFIRINOX 04/27/2021, Udenyca added -Cycle 5 FOLFIRINOX 05/12/2021, Udenyca -MRI abdomen 05/23/2021-decrease in size of pancreas mass and hepatic lesions, no progressive disease -Cycle 6 FOLFIRINOX 05/27/2021 -CT 06/17/2021-multiple liver metastases similar to her prior exam.  Pancreas mass not significantly changed. -CT 06/26/2021-stable pancreas mass, no significant change in size and number of known liver metastases. -CT abdomen/pelvis 07/12/2021-new gastrostomy tube.  Multiple liver masses slightly decreased in size from prior exam.  Pancreas head mass unchanged. -Cycle 1 gemcitabine/Abraxane 07/30/2021 -Cycle 2 gemcitabine/Abraxane 08/13/2021 -Cycle 3 gemcitabine 08/27/2021, Abraxane held due to significant bilateral toe pain, question neuropathy -Cycle 4 gemcitabine, Abraxane held due to neuropathy, 09/10/2021 -Cycle 5 gemcitabine, Abraxane held due to neuropathy, 09/24/2021 -Cycle 6 gemcitabine, Abraxane held due to neuropathy 10/16/2021 -CTs 10/29/2021-mild progression of multifocal liver metastases; mild increase in size of hypoenhancing mass within the head of the pancreas; new mild  pleural nodularity along the posterior lower lobes -Cycle 7 gemcitabine/Abraxane 11/03/2021 -Cycle 8 gemcitabine/Abraxane 11/23/2021 -Cycle 9 gemcitabine/Abraxane 12/10/2021 -Cycle 10 Gemcitabine/Abraxane 12/24/2021 -CT abdomen/pelvis  01/04/2022-decrease size of pancreas head mass, no change in multifocal hepatic metastases -Cycle 11 gemcitabine/Abraxane 01/07/2022 -Cycle 12 gemcitabine/Abraxane 02/04/2022 -Cycle 13 Gemcitabine/Abraxane 02/18/2022 -Cycle 14 gemcitabine/Abraxane 03/11/2022 -Cycle 15 gemcitabine/Abraxane 03/29/2022 -Cycle 16 gemcitabine/Abraxane 04/16/2022 -CT abdomen/pelvis 04/22/2022-mixed response in the liver, some lesions larger, some smaller, no change in pancreas head mass, no evidence of metastatic disease elsewhere in the abdomen or pelvis -Cycle 17 gemcitabine/Abraxane 05/17/2022 -Cycle 18 Gemcitabine/Abraxane 06/01/2022 -Cycle 19 gemcitabine/Abraxane 06/18/2022 -Cycle 20 gemcitabine/Abraxane 07/05/2022 -Cycle 21 gemcitabine/Abraxane 08/09/2022 2.  Iron deficiency anemia-improved -Feraheme 510 mg 02/20/2021 3.  Protein calorie malnutrition 4.  Possible right ventricular filling defect on noncontrast CT scan MRI cardiac morphology 02/21/2021-mass in the mid RV attached to the interventricular septum measures 16 mm x 6 mm.  Mass appears heterogeneous with area of enhancement on LGE imaging.  Mass is concerning for metastatic tumor.  Normal LV size and systolic function.  Normal RV size and systolic function. 5.  Hypertension 6.  History of CVA 7.  Hyperlipidemia 8.  Hypothyroidism 9.  Morbid obesity 10.  Asthma 11.  Migraines 12.  Pain secondary to #1 13.  Port-A-Cath placement 02/25/2021 14.  Hypokalemia 04/01/2021-started a potassium supplement 15.  Hospital admission 06/17/2021-intractable nausea and vomiting EGD 06/19/2021-diffuse gastritis, nonobstructing duodenal mass, biopsy of stomach-no malignancy or H. pylori, duodenal mass-adenocarcinoma 16.  Hospital admission 07/12/2021  with nausea/vomiting, abdominal pain, and hypokalemia 17.  Percutaneous gastrostomy tube placement 07/01/2021 18.  07/21/2022 bilateral lower extremity venous Doppler study-negative for DVT.    Disposition: [name] appears unchanged.  She was last treated with gemcitabine/Abraxane 07/05/2022.  She has missed several visits.  Plan to proceed with cycle 21 gemcitabine/Abraxane today as scheduled.  She will undergo restaging CTs later this week.  CBC and chemistry panel reviewed.  Labs adequate to proceed as above.  For pain she will continue MS Contin with oxycodone and morphine concentrate as prescribed for as needed use.  She will complete a course of Diflucan for mild thrush.  She will return for follow-up in 2 weeks.   [name]   08/09/2022  8:52 AM

## 2022-08-09 NOTE — Progress Notes (Signed)
Patient seen by Lisa Thomas NP today  Vitals are within treatment parameters.  Labs reviewed by Lisa Thomas NP and are within treatment parameters.  Per physician team, patient is ready for treatment and there are NO modifications to the treatment plan.     

## 2022-08-10 ENCOUNTER — Other Ambulatory Visit: Payer: Self-pay

## 2022-08-10 LAB — CANCER ANTIGEN 19-9: CA 19-9: 6509 U/mL — ABNORMAL HIGH (ref 0–35)

## 2022-08-13 ENCOUNTER — Other Ambulatory Visit: Payer: Self-pay

## 2022-08-13 ENCOUNTER — Ambulatory Visit (HOSPITAL_BASED_OUTPATIENT_CLINIC_OR_DEPARTMENT_OTHER): Payer: Medicaid Other

## 2022-08-13 ENCOUNTER — Inpatient Hospital Stay: Payer: Medicaid Other

## 2022-08-15 ENCOUNTER — Other Ambulatory Visit: Payer: Self-pay | Admitting: Oncology

## 2022-08-15 DIAGNOSIS — C250 Malignant neoplasm of head of pancreas: Secondary | ICD-10-CM

## 2022-08-15 NOTE — Progress Notes (Signed)
ON PATHWAY REGIMEN - Pancreatic Adenocarcinoma  No Change  Continue With Treatment as Ordered.  Original Decision Date/Time: 07/07/2021 15:21     A cycle is every 28 days:     Nab-paclitaxel (protein bound)      Gemcitabine   **Always confirm dose/schedule in your pharmacy ordering system**  Patient Characteristics: Metastatic Disease, Second Line, MSS/pMMR or MSI Unknown, Fluoropyrimidine-Based Therapy First Line Therapeutic Status: Metastatic Disease Line of Therapy: Second Line Microsatellite/Mismatch Repair Status: Unknown Intent of Therapy: Non-Curative / Palliative Intent, Discussed with Patient

## 2022-08-18 ENCOUNTER — Ambulatory Visit: Payer: Medicaid Other

## 2022-08-18 ENCOUNTER — Other Ambulatory Visit: Payer: Medicaid Other

## 2022-08-18 ENCOUNTER — Ambulatory Visit: Payer: Medicaid Other | Admitting: Nurse Practitioner

## 2022-08-20 ENCOUNTER — Other Ambulatory Visit: Payer: Self-pay

## 2022-08-23 ENCOUNTER — Encounter: Payer: Self-pay | Admitting: Nurse Practitioner

## 2022-08-23 ENCOUNTER — Inpatient Hospital Stay: Payer: Medicaid Other

## 2022-08-23 ENCOUNTER — Inpatient Hospital Stay (HOSPITAL_BASED_OUTPATIENT_CLINIC_OR_DEPARTMENT_OTHER): Payer: Medicaid Other | Admitting: Nurse Practitioner

## 2022-08-23 ENCOUNTER — Inpatient Hospital Stay: Payer: Medicaid Other | Attending: Nurse Practitioner

## 2022-08-23 ENCOUNTER — Encounter: Payer: Self-pay | Admitting: *Deleted

## 2022-08-23 VITALS — BP 168/98 | HR 83

## 2022-08-23 VITALS — BP 145/93 | HR 93 | Temp 98.1°F | Resp 20 | Ht 64.0 in | Wt 200.0 lb

## 2022-08-23 DIAGNOSIS — B370 Candidal stomatitis: Secondary | ICD-10-CM | POA: Diagnosis not present

## 2022-08-23 DIAGNOSIS — J4540 Moderate persistent asthma, uncomplicated: Secondary | ICD-10-CM

## 2022-08-23 DIAGNOSIS — I10 Essential (primary) hypertension: Secondary | ICD-10-CM | POA: Diagnosis not present

## 2022-08-23 DIAGNOSIS — D509 Iron deficiency anemia, unspecified: Secondary | ICD-10-CM | POA: Diagnosis not present

## 2022-08-23 DIAGNOSIS — G43909 Migraine, unspecified, not intractable, without status migrainosus: Secondary | ICD-10-CM | POA: Diagnosis not present

## 2022-08-23 DIAGNOSIS — E039 Hypothyroidism, unspecified: Secondary | ICD-10-CM | POA: Diagnosis not present

## 2022-08-23 DIAGNOSIS — J45909 Unspecified asthma, uncomplicated: Secondary | ICD-10-CM | POA: Diagnosis not present

## 2022-08-23 DIAGNOSIS — E876 Hypokalemia: Secondary | ICD-10-CM | POA: Insufficient documentation

## 2022-08-23 DIAGNOSIS — E785 Hyperlipidemia, unspecified: Secondary | ICD-10-CM | POA: Insufficient documentation

## 2022-08-23 DIAGNOSIS — C250 Malignant neoplasm of head of pancreas: Secondary | ICD-10-CM

## 2022-08-23 DIAGNOSIS — C787 Secondary malignant neoplasm of liver and intrahepatic bile duct: Secondary | ICD-10-CM | POA: Insufficient documentation

## 2022-08-23 DIAGNOSIS — C259 Malignant neoplasm of pancreas, unspecified: Secondary | ICD-10-CM | POA: Diagnosis not present

## 2022-08-23 DIAGNOSIS — E46 Unspecified protein-calorie malnutrition: Secondary | ICD-10-CM | POA: Diagnosis not present

## 2022-08-23 DIAGNOSIS — Z8673 Personal history of transient ischemic attack (TIA), and cerebral infarction without residual deficits: Secondary | ICD-10-CM | POA: Diagnosis not present

## 2022-08-23 DIAGNOSIS — Z5111 Encounter for antineoplastic chemotherapy: Secondary | ICD-10-CM | POA: Diagnosis present

## 2022-08-23 LAB — CMP (CANCER CENTER ONLY)
ALT: 10 U/L (ref 0–44)
AST: 17 U/L (ref 15–41)
Albumin: 3.8 g/dL (ref 3.5–5.0)
Alkaline Phosphatase: 127 U/L — ABNORMAL HIGH (ref 38–126)
Anion gap: 9 (ref 5–15)
BUN: 15 mg/dL (ref 6–20)
CO2: 29 mmol/L (ref 22–32)
Calcium: 9.6 mg/dL (ref 8.9–10.3)
Chloride: 105 mmol/L (ref 98–111)
Creatinine: 0.85 mg/dL (ref 0.44–1.00)
GFR, Estimated: >60 mL/min (ref >60)
Glucose, Bld: 103 mg/dL — ABNORMAL HIGH (ref 70–99)
Potassium: 3.7 mmol/L (ref 3.5–5.1)
Sodium: 143 mmol/L (ref 135–145)
Total Bilirubin: 0.4 mg/dL (ref 0.3–1.2)
Total Protein: 6.9 g/dL (ref 6.5–8.1)

## 2022-08-23 LAB — CBC WITH DIFFERENTIAL (CANCER CENTER ONLY)
Abs Immature Granulocytes: 0 10*3/uL (ref 0.00–0.07)
Basophils Absolute: 0 10*3/uL (ref 0.0–0.1)
Basophils Relative: 1 %
Eosinophils Absolute: 0.2 10*3/uL (ref 0.0–0.5)
Eosinophils Relative: 6 %
HCT: 31.4 % — ABNORMAL LOW (ref 36.0–46.0)
Hemoglobin: 10 g/dL — ABNORMAL LOW (ref 12.0–15.0)
Immature Granulocytes: 0 %
Lymphocytes Relative: 30 %
Lymphs Abs: 1 10*3/uL (ref 0.7–4.0)
MCH: 28.4 pg (ref 26.0–34.0)
MCHC: 31.8 g/dL (ref 30.0–36.0)
MCV: 89.2 fL (ref 80.0–100.0)
Monocytes Absolute: 0.3 10*3/uL (ref 0.1–1.0)
Monocytes Relative: 8 %
Neutro Abs: 1.9 10*3/uL (ref 1.7–7.7)
Neutrophils Relative %: 55 %
Platelet Count: 239 10*3/uL (ref 150–400)
RBC: 3.52 MIL/uL — ABNORMAL LOW (ref 3.87–5.11)
RDW: 18.6 % — ABNORMAL HIGH (ref 11.5–15.5)
WBC Count: 3.4 10*3/uL — ABNORMAL LOW (ref 4.0–10.5)
nRBC: 0 % (ref 0.0–0.2)

## 2022-08-23 MED ORDER — ZOLPIDEM TARTRATE 10 MG PO TABS: ORAL_TABLET | ORAL | 0 refills | Status: DC

## 2022-08-23 MED ORDER — PROCHLORPERAZINE MALEATE 10 MG PO TABS
10.0000 mg | ORAL_TABLET | Freq: Once | ORAL | Status: AC
  Administered 2022-08-23: 10 mg via ORAL
  Filled 2022-08-23: qty 1

## 2022-08-23 MED ORDER — DULOXETINE HCL 30 MG PO CPEP: ORAL_CAPSULE | ORAL | 3 refills | Status: DC

## 2022-08-23 MED ORDER — ONDANSETRON 4 MG PO TBDP: 4.0000 mg | ORAL_TABLET | Freq: Three times a day (TID) | ORAL | 3 refills | Status: DC | PRN

## 2022-08-23 MED ORDER — SODIUM CHLORIDE 0.9% FLUSH
10.0000 mL | INTRAVENOUS | Status: DC | PRN
  Administered 2022-08-23: 10 mL

## 2022-08-23 MED ORDER — MORPHINE SULFATE ER 60 MG PO TBCR: 60.0000 mg | EXTENDED_RELEASE_TABLET | Freq: Two times a day (BID) | ORAL | 0 refills | Status: DC

## 2022-08-23 MED ORDER — ALBUTEROL SULFATE HFA 108 (90 BASE) MCG/ACT IN AERS: INHALATION_SPRAY | RESPIRATORY_TRACT | 3 refills | Status: DC

## 2022-08-23 MED ORDER — PACLITAXEL PROTEIN-BOUND CHEMO INJECTION 100 MG
100.0000 mg/m2 | Freq: Once | INTRAVENOUS | Status: AC
  Administered 2022-08-23: 200 mg via INTRAVENOUS
  Filled 2022-08-23: qty 40

## 2022-08-23 MED ORDER — SYMBICORT 160-4.5 MCG/ACT IN AERO: 2.0000 | INHALATION_SPRAY | Freq: Two times a day (BID) | RESPIRATORY_TRACT | 1 refills | Status: DC

## 2022-08-23 MED ORDER — SODIUM CHLORIDE 0.9 % IV SOLN
1000.0000 mg/m2 | Freq: Once | INTRAVENOUS | Status: AC
  Administered 2022-08-23: 2052 mg via INTRAVENOUS
  Filled 2022-08-23: qty 5.26

## 2022-08-23 MED ORDER — PROCHLORPERAZINE MALEATE 10 MG PO TABS: 10.0000 mg | ORAL_TABLET | Freq: Four times a day (QID) | ORAL | 2 refills | Status: DC | PRN

## 2022-08-23 MED ORDER — HEPARIN SOD (PORK) LOCK FLUSH 100 UNIT/ML IV SOLN
500.0000 [IU] | Freq: Once | INTRAVENOUS | Status: AC | PRN
  Administered 2022-08-23: 500 [IU]

## 2022-08-23 MED ORDER — SODIUM CHLORIDE 0.9 % IV SOLN: Freq: Once | INTRAVENOUS | Status: AC

## 2022-08-23 MED ORDER — MORPHINE SULFATE (CONCENTRATE) 10 MG/0.5ML PO SOLN: 10.0000 mg | ORAL | 0 refills | Status: DC | PRN

## 2022-08-23 MED ORDER — OXYCODONE HCL 5 MG PO TABS: 5.0000 mg | ORAL_TABLET | ORAL | 0 refills | Status: DC | PRN

## 2022-08-23 MED ORDER — FLUCONAZOLE 100 MG PO TABS: ORAL_TABLET | ORAL | 1 refills | Status: DC

## 2022-08-23 NOTE — Progress Notes (Signed)
[place] OFFICE PROGRESS NOTE   Diagnosis: Pancreas cancer  INTERVAL HISTORY:   Ms. Howeth returns as scheduled.  She completed a cycle of gemcitabine/Abraxane 08/09/2022.  She typically has mild nausea for 1 to 2 days.  No mouth sores.  No diarrhea.  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  Pain is well controlled on the current regimen.  Objective:  Vital signs in last 24 hours:  Blood pressure (!) 145/93, pulse 93, temperature 98.1 F (36.7 C), temperature source Oral, resp. rate 20, height '5\' 4"'  (1.626 m), weight 200 lb (90.7 kg), SpO2 100 %.    HEENT: No thrush or ulcers. Resp: Lungs clear bilaterally. Cardio: Regular rate and rhythm. GI: Abdomen soft, tender at the right upper abdomen.  No hepatomegaly. Vascular: No leg edema. Neuro: Alert and oriented. Skin: No rash. Port-A-Cath without erythema.  Lab Results:  Lab Results  Component Value Date   WBC 3.4 (L) 08/23/2022   HGB 10.0 (L) 08/23/2022   HCT 31.4 (L) 08/23/2022   MCV 89.2 08/23/2022   PLT 239 08/23/2022   NEUTROABS 1.9 08/23/2022    Imaging:  No results found.  Medications: I have reviewed the patient's current medications.  Assessment/Plan: Pancreas cancer -CT abdomen/pelvis without contrast 02/16/2021-multiple large hypoechoic masses in the patient's liver consistent metastatic disease, ill-defined masslike area in the pancreatic head measuring approximate 4.7 cm, possible filling defect in the right ventricle. -EGD 02/19/2021-large infiltrative mass with bleeding in the second portion of the duodenum, appears to be arising near the ampulla, partially obstructive with friable mucosa.  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 -Flexible sigmoidoscopy 02/19/2021-diverticulosis in the sigmoid colon, nonbleeding external and internal hemorrhoids -Cardiac CT 02/20/2021-mass in the mid RV attached to the interventricular septum measuring 16 mm x 6 mm and  concerning for metastatic tumor -MRI abdomen with/without contrast 02/21/2021-mass in the posterior pancreatic head measuring 4.3 x 3.8 cm with obstruction of the central pancreatic duct and diffuse dilatation up to 6 mm consistent with pancreatic adenocarcinoma, liver lesions the largest mass of the central left lobe measuring 9.9 x 9.2 cm consistent with hepatic metastatic disease,  hepatomegaly. -Ultrasound-guided biopsy of a left liver lesion 02/24/2021-adenocarcinoma, morphologically similar to the duodenal biopsy; microsatellite stable, tumor mutation burden 1 -Negative genetic testing -Palliative radiation to the duodenal mass 02/25/2021-03/06/2021 -Cycle 1 FOLFOX 03/17/2021 -Cycle 2 FOLFOX 04/01/2021 -Cycle 3 FOLFIRINOX 04/14/2021 -Cycle 4 FOLFIRINOX 04/27/2021, Udenyca added -Cycle 5 FOLFIRINOX 05/12/2021, Udenyca -MRI abdomen 05/23/2021-decrease in size of pancreas mass and hepatic lesions, no progressive disease -Cycle 6 FOLFIRINOX 05/27/2021 -CT 06/17/2021-multiple liver metastases similar to her prior exam.  Pancreas mass not significantly changed. -CT 06/26/2021-stable pancreas mass, no significant change in size and number of known liver metastases. -CT abdomen/pelvis 07/12/2021-new gastrostomy tube.  Multiple liver masses slightly decreased in size from prior exam.  Pancreas head mass unchanged. -Cycle 1 gemcitabine/Abraxane 07/30/2021 -Cycle 2 gemcitabine/Abraxane 08/13/2021 -Cycle 3 gemcitabine 08/27/2021, Abraxane held due to significant bilateral toe pain, question neuropathy -Cycle 4 gemcitabine, Abraxane held due to neuropathy, 09/10/2021 -Cycle 5 gemcitabine, Abraxane held due to neuropathy, 09/24/2021 -Cycle 6 gemcitabine, Abraxane held due to neuropathy 10/16/2021 -CTs 10/29/2021-mild progression of multifocal liver metastases; mild increase in size of hypoenhancing mass within the head of the pancreas; new mild pleural nodularity along the posterior lower lobes -Cycle 7 gemcitabine/Abraxane  11/03/2021 -Cycle 8 gemcitabine/Abraxane 11/23/2021 -Cycle 9 gemcitabine/Abraxane 12/10/2021 -Cycle 10 Gemcitabine/Abraxane 12/24/2021 -CT abdomen/pelvis 01/04/2022-decrease size of pancreas head mass, no change in multifocal hepatic metastases -Cycle  11 gemcitabine/Abraxane 01/07/2022 -Cycle 12 gemcitabine/Abraxane 02/04/2022 -Cycle 13 Gemcitabine/Abraxane 02/18/2022 -Cycle 14 gemcitabine/Abraxane 03/11/2022 -Cycle 15 gemcitabine/Abraxane 03/29/2022 -Cycle 16 gemcitabine/Abraxane 04/16/2022 -CT abdomen/pelvis 04/22/2022-mixed response in the liver, some lesions larger, some smaller, no change in pancreas head mass, no evidence of metastatic disease elsewhere in the abdomen or pelvis -Cycle 17 gemcitabine/Abraxane 05/17/2022 -Cycle 18 Gemcitabine/Abraxane 06/01/2022 -Cycle 19 gemcitabine/Abraxane 06/18/2022 -Cycle 20 gemcitabine/Abraxane 07/05/2022 -Cycle 21 gemcitabine/Abraxane 08/09/2022 -Cycle 22 gemcitabine/Abraxane 08/23/2022 2.  Iron deficiency anemia-improved -Feraheme 510 mg 02/20/2021 3.  Protein calorie malnutrition 4.  Possible right ventricular filling defect on noncontrast CT scan MRI cardiac morphology 02/21/2021-mass in the mid RV attached to the interventricular septum measures 16 mm x 6 mm.  Mass appears heterogeneous with area of enhancement on LGE imaging.  Mass is concerning for metastatic tumor.  Normal LV size and systolic function.  Normal RV size and systolic function. 5.  Hypertension 6.  History of CVA 7.  Hyperlipidemia 8.  Hypothyroidism 9.  Morbid obesity 10.  Asthma 11.  Migraines 12.  Pain secondary to #1 13.  Port-A-Cath placement 02/25/2021 14.  Hypokalemia 04/01/2021-started a potassium supplement 15.  Hospital admission 06/17/2021-intractable nausea and vomiting EGD 06/19/2021-diffuse gastritis, nonobstructing duodenal mass, biopsy of stomach-no malignancy or H. pylori, duodenal mass-adenocarcinoma 16.  Hospital admission 07/12/2021 with nausea/vomiting, abdominal pain, and  hypokalemia 17.  Percutaneous gastrostomy tube placement 07/01/2021 18.  07/21/2022 bilateral lower extremity venous Doppler study-negative for DVT.  Disposition: Wendy Hoffman appears unchanged.  She continues Gemcitabine/Abraxane, overall tolerating well.  Plan to proceed with treatment today as scheduled.  Restaging CTs prior to next office visit.  CBC reviewed.  Labs adequate to proceed as above.  Pain medication refill sent to her pharmacy today.  She will return for follow-up in 2 weeks.    [name]   08/23/2022  11:37 AM

## 2022-08-23 NOTE — Patient Instructions (Signed)

## 2022-08-23 NOTE — Progress Notes (Signed)
Patient seen by Ned Card NP today  Vitals are not all within treatment parameters. OK to proceed w/DBP-93. No intervention at this time  Labs reviewed by Ned Card NP and are within treatment parameters.  Per physician team, patient is ready for treatment and there are NO modifications to the treatment plan.

## 2022-08-23 NOTE — Patient Instructions (Signed)
[facility]   Discharge Instructions: Thank you for choosing [facility] to provide your oncology and hematology care.   If you have a lab appointment with the [facility], please go directly to the [facility] and check in at the registration area.   Wear comfortable clothing and clothing appropriate for easy access to any Portacath or PICC line.   We strive to give you quality time with your provider. You may need to reschedule your appointment if you arrive late (15 or more minutes).  Arriving late affects you and other patients whose appointments are after yours.  Also, if you miss three or more appointments without notifying the office, you may be dismissed from the clinic at the provider's discretion.      For prescription refill requests, have your pharmacy contact our office and allow 72 hours for refills to be completed.    Today you received the following chemotherapy and/or immunotherapy agents Paclitaxel-protein bound (ABRAXANE) & Gemcitabine (GEMZAR).      To help prevent nausea and vomiting after your treatment, we encourage you to take your nausea medication as directed.  BELOW ARE SYMPTOMS THAT SHOULD BE REPORTED IMMEDIATELY: *FEVER GREATER THAN 100.4 F (38 C) OR HIGHER *CHILLS OR SWEATING *NAUSEA AND VOMITING THAT IS NOT CONTROLLED WITH YOUR NAUSEA MEDICATION *UNUSUAL SHORTNESS OF BREATH *UNUSUAL BRUISING OR BLEEDING *URINARY PROBLEMS (pain or burning when urinating, or frequent urination) *BOWEL PROBLEMS (unusual diarrhea, constipation, pain near the anus) TENDERNESS IN MOUTH AND THROAT WITH OR WITHOUT PRESENCE OF ULCERS (sore throat, sores in mouth, or a toothache) UNUSUAL RASH, SWELLING OR PAIN  UNUSUAL VAGINAL DISCHARGE OR ITCHING   Items with * indicate a potential emergency and should be followed up as soon as possible or go to the Emergency Department if any problems should occur.  Please show the CHEMOTHERAPY ALERT  CARD or IMMUNOTHERAPY ALERT CARD at check-in to the Emergency Department and triage nurse.  Should you have questions after your visit or need to cancel or reschedule your appointment, please contact [facility]  Dept: [phone]  and follow the prompts.  Office hours are 8:00 a.m. to 4:30 p.m. Monday - Friday. Please note that voicemails left after 4:00 p.m. may not be returned until the following business day.  We are closed weekends and major holidays. You have access to a nurse at all times for urgent questions. Please call the main number to the clinic Dept: [phone] and follow the prompts.   For any non-urgent questions, you may also contact your provider using MyChart. We now offer e-Visits for anyone 60 and older to request care online for non-urgent symptoms. For details visit [email or web address].   Also download the MyChart app! Go to the app store, search "MyChart", open the app, select [facility], and log in with your MyChart username and password.  Masks are optional in the cancer centers. If you would like for your care team to wear a mask while they are taking care of you, please let them know. You may have one support person who is at least 54 years old accompany you for your appointments.  Paclitaxel Nanoparticle Albumin-Bound Injection What is this medication? NANOPARTICLE ALBUMIN-BOUND PACLITAXEL (Na no PAHR ti kuhl al BYOO muhn-bound PAK li TAX el) treats some types of cancer. It works by slowing down the growth of cancer cells. This medicine may be used for other purposes; ask your health care provider or pharmacist if you  have questions. COMMON BRAND NAME(S): Abraxane What should I tell my care team before I take this medication? They need to know if you have any of these conditions: Liver disease Low white blood cell levels An unusual or allergic reaction to paclitaxel, albumin, other medications, foods, dyes, or preservatives If you  or your partner are pregnant or trying to get pregnant Breast-feeding How should I use this medication? This medication is injected into a vein. It is given by your care team in a hospital or clinic setting. Talk to your care team about the use of this medication in children. Special care may be needed. Overdosage: If you think you have taken too much of this medicine contact a poison control center or emergency room at once. NOTE: This medicine is only for you. Do not share this medicine with others. What if I miss a dose? Keep appointments for follow-up doses. It is important not to miss your dose. Call your care team if you are unable to keep an appointment. What may interact with this medication? Other medications may affect the way this medication works. Talk with your care team about all of the medications you take. They may suggest changes to your treatment plan to lower the risk of side effects and to make sure your medications work as intended. This list may not describe all possible interactions. Give your health care provider a list of all the medicines, herbs, non-prescription drugs, or dietary supplements you use. Also tell them if you smoke, drink alcohol, or use illegal drugs. Some items may interact with your medicine. What should I watch for while using this medication? Your condition will be monitored carefully while you are receiving this medication. You may need blood work while taking this medication. This medication may make you feel generally unwell. This is not uncommon as chemotherapy can affect healthy cells as well as cancer cells. Report any side effects. Continue your course of treatment even though you feel ill unless your care team tells you to stop. This medication can cause serious allergic reactions. To reduce the risk, your care team may give you other medications to take before receiving this one. Be sure to follow the directions from your care team. This  medication may increase your risk of getting an infection. Call your care team for advice if you get a fever, chills, sore throat, or other symptoms of a cold or flu. Do not treat yourself. Try to avoid being around people who are sick. This medication may increase your risk to bruise or bleed. Call your care team if you notice any unusual bleeding. Be careful brushing or flossing your teeth or using a toothpick because you may get an infection or bleed more easily. If you have any dental work done, tell your dentist you are receiving this medication. Talk to your care team if you or your partner may be pregnant. Serious birth defects can occur if you take this medication during pregnancy and for 6 months after the last dose. You will need a negative pregnancy test before starting this medication. Contraception is recommended while taking this medication and for 6 months after the last dose. Your care team can help you find the option that works for you. If your partner can get pregnant, use a condom during sex while taking this medication and for 3 months after the last dose. Do not breastfeed while taking this medication and for 2 weeks after the last dose. This medication may cause infertility. Talk  to your care team if you are concerned about your fertility. What side effects may I notice from receiving this medication? Side effects that you should report to your care team as soon as possible: Allergic reactions--skin rash, itching, hives, swelling of the face, lips, tongue, or throat Dry cough, shortness of breath or trouble breathing Infection--fever, chills, cough, sore throat, wounds that don't heal, pain or trouble when passing urine, general feeling of discomfort or being unwell Low red blood cell level--unusual weakness or fatigue, dizziness, headache, trouble breathing Pain, tingling, or numbness in the hands or feet Stomach pain, unusual weakness or fatigue, nausea, vomiting, diarrhea, or  fever that lasts longer than expected Unusual bruising or bleeding Side effects that usually do not require medical attention (report to your care team if they continue or are bothersome): Diarrhea Fatigue Hair loss Loss of appetite Nausea Vomiting This list may not describe all possible side effects. Call your doctor for medical advice about side effects. You may report side effects to FDA at 1-800-FDA-1088. Where should I keep my medication? This medication is given in a hospital or clinic. It will not be stored at home. NOTE: This sheet is a summary. It may not cover all possible information. If you have questions about this medicine, talk to your doctor, pharmacist, or health care provider.  2023 Elsevier/Gold Standard (2022-03-31 00:00:00)  Gemcitabine Injection What is this medication? GEMCITABINE (jem SYE ta been) treats some types of cancer. It works by slowing down the growth of cancer cells. This medicine may be used for other purposes; ask your health care provider or pharmacist if you have questions. COMMON BRAND NAME(S): Gemzar, Infugem What should I tell my care team before I take this medication? They need to know if you have any of these conditions: Blood disorders Infection Kidney disease Liver disease Lung or breathing disease, such as asthma or COPD Recent or ongoing radiation therapy An unusual or allergic reaction to gemcitabine, other medications, foods, dyes, or preservatives If you or your partner are pregnant or trying to get pregnant Breast-feeding How should I use this medication? This medication is injected into a vein. It is given by your care team in a hospital or clinic setting. Talk to your care team about the use of this medication in children. Special care may be needed. Overdosage: If you think you have taken too much of this medicine contact a poison control center or emergency room at once. NOTE: This medicine is only for you. Do not share this  medicine with others. What if I miss a dose? Keep appointments for follow-up doses. It is important not to miss your dose. Call your care team if you are unable to keep an appointment. What may interact with this medication? Interactions have not been studied. This list may not describe all possible interactions. Give your health care provider a list of all the medicines, herbs, non-prescription drugs, or dietary supplements you use. Also tell them if you smoke, drink alcohol, or use illegal drugs. Some items may interact with your medicine. What should I watch for while using this medication? Your condition will be monitored carefully while you are receiving this medication. This medication may make you feel generally unwell. This is not uncommon, as chemotherapy can affect healthy cells as well as cancer cells. Report any side effects. Continue your course of treatment even though you feel ill unless your care team tells you to stop. In some cases, you may be given additional medications to help  with side effects. Follow all directions for their use. This medication may increase your risk of getting an infection. Call your care team for advice if you get a fever, chills, sore throat, or other symptoms of a cold or flu. Do not treat yourself. Try to avoid being around people who are sick. This medication may increase your risk to bruise or bleed. Call your care team if you notice any unusual bleeding. Be careful brushing or flossing your teeth or using a toothpick because you may get an infection or bleed more easily. If you have any dental work done, tell your dentist you are receiving this medication. Avoid taking medications that contain aspirin, acetaminophen, ibuprofen, naproxen, or ketoprofen unless instructed by your care team. These medications may hide a fever. Talk to your care team if you or your partner wish to become pregnant or think you might be pregnant. This medication can cause  serious birth defects if taken during pregnancy and for 6 months after the last dose. A negative pregnancy test is required before starting this medication. A reliable form of contraception is recommended while taking this medication and for 6 months after the last dose. Talk to your care team about effective forms of contraception. Do not father a child while taking this medication and for 3 months after the last dose. Use a condom while having sex during this time period. Do not breastfeed while taking this medication and for at least 1 week after the last dose. This medication may cause infertility. Talk to your care team if you are concerned about your fertility. What side effects may I notice from receiving this medication? Side effects that you should report to your care team as soon as possible: Allergic reactions--skin rash, itching, hives, swelling of the face, lips, tongue, or throat Capillary leak syndrome--stomach or muscle pain, unusual weakness or fatigue, feeling faint or lightheaded, decrease in the amount of urine, swelling of the ankles, hands, or feet, trouble breathing Infection--fever, chills, cough, sore throat, wounds that don't heal, pain or trouble when passing urine, general feeling of discomfort or being unwell Liver injury--right upper belly pain, loss of appetite, nausea, light-colored stool, dark yellow or brown urine, yellowing skin or eyes, unusual weakness or fatigue Low red blood cell level--unusual weakness or fatigue, dizziness, headache, trouble breathing Lung injury--shortness of breath or trouble breathing, cough, spitting up blood, chest pain, fever Stomach pain, bloody diarrhea, pale skin, unusual weakness or fatigue, decrease in the amount of urine, which may be signs of hemolytic uremic syndrome Sudden and severe headache, confusion, change in vision, seizures, which may be signs of posterior reversible encephalopathy syndrome (PRES) Unusual bruising or  bleeding Side effects that usually do not require medical attention (report to your care team if they continue or are bothersome): Diarrhea Drowsiness Hair loss Nausea Pain, redness, or swelling with sores inside the mouth or throat Vomiting This list may not describe all possible side effects. Call your doctor for medical advice about side effects. You may report side effects to FDA at 1-800-FDA-1088. Where should I keep my medication? This medication is given in a hospital or clinic. It will not be stored at home. NOTE: This sheet is a summary. It may not cover all possible information. If you have questions about this medicine, talk to your doctor, pharmacist, or health care provider.  2023 Elsevier/Gold Standard (2022-03-31 00:00:00)

## 2022-08-24 ENCOUNTER — Other Ambulatory Visit: Payer: Self-pay

## 2022-08-24 LAB — CANCER ANTIGEN 19-9: CA 19-9: 8993 U/mL — ABNORMAL HIGH (ref 0–35)

## 2022-08-25 ENCOUNTER — Telehealth: Payer: Self-pay | Admitting: *Deleted

## 2022-08-25 NOTE — Telephone Encounter (Addendum)
Wendy Hoffman called to report that [facility] did not fill her morphine scripts reporting she has "reached her maximum narcotic usage". Call to pharmacy and was informed both medications require PA. Completed PA process on CoverMyMeds and also faxed to [phone].

## 2022-08-26 ENCOUNTER — Ambulatory Visit (HOSPITAL_COMMUNITY): Payer: Medicaid Other | Attending: Oncology

## 2022-08-26 ENCOUNTER — Inpatient Hospital Stay: Payer: Medicaid Other

## 2022-08-26 ENCOUNTER — Encounter: Payer: Self-pay | Admitting: Oncology

## 2022-08-26 NOTE — Telephone Encounter (Signed)
Notified pharmacy that MS Contin was approved and her copay is $4. The morphine sulfate concentrate has also been approved as well as patient.

## 2022-08-28 ENCOUNTER — Other Ambulatory Visit: Payer: Self-pay

## 2022-08-30 ENCOUNTER — Telehealth: Payer: Self-pay

## 2022-08-30 ENCOUNTER — Encounter: Payer: Self-pay | Admitting: Oncology

## 2022-08-30 ENCOUNTER — Other Ambulatory Visit: Payer: Self-pay | Admitting: Nurse Practitioner

## 2022-08-30 ENCOUNTER — Other Ambulatory Visit (HOSPITAL_BASED_OUTPATIENT_CLINIC_OR_DEPARTMENT_OTHER): Payer: Self-pay

## 2022-08-30 ENCOUNTER — Encounter: Payer: Self-pay | Admitting: *Deleted

## 2022-08-30 DIAGNOSIS — C250 Malignant neoplasm of head of pancreas: Secondary | ICD-10-CM

## 2022-08-30 MED ORDER — OXYCODONE HCL 5 MG PO TABS
5.0000 mg | ORAL_TABLET | ORAL | 0 refills | Status: DC | PRN
  Filled 2022-08-30: qty 60, 10d supply, fill #0

## 2022-08-30 NOTE — Telephone Encounter (Signed)
Call placed to pt, no answer

## 2022-08-31 ENCOUNTER — Telehealth: Payer: Self-pay | Admitting: *Deleted

## 2022-08-31 NOTE — Telephone Encounter (Signed)
CT scan scheduled for [facility] for 09/01/22 at 1:30 pm with 1:00 pm arrival. Nothing to eat or drink after 0930 tomorrow. Drink oral contrast at 1130 and 12:20 that day. Arrive to 2nd floor for port access at 12:15. Requested and return call or MyChart confirmation that appointment information was received.

## 2022-09-01 ENCOUNTER — Other Ambulatory Visit: Payer: Medicaid Other

## 2022-09-01 ENCOUNTER — Inpatient Hospital Stay: Payer: Medicaid Other

## 2022-09-01 ENCOUNTER — Ambulatory Visit (HOSPITAL_COMMUNITY)
Admission: RE | Admit: 2022-09-01 | Discharge: 2022-09-01 | Disposition: A | Discharge status: Home / Self Care | Payer: Medicaid Other | Source: Ambulatory Visit | Attending: Oncology | Admitting: Oncology

## 2022-09-01 DIAGNOSIS — C250 Malignant neoplasm of head of pancreas: Secondary | ICD-10-CM | POA: Insufficient documentation

## 2022-09-01 MED ORDER — HEPARIN SOD (PORK) LOCK FLUSH 100 UNIT/ML IV SOLN
500.0000 [IU] | Freq: Once | INTRAVENOUS | Status: AC
  Administered 2022-09-01: 500 [IU] via INTRAVENOUS

## 2022-09-01 MED ORDER — IOHEXOL 300 MG/ML  SOLN
100.0000 mL | Freq: Once | INTRAMUSCULAR | Status: AC | PRN
  Administered 2022-09-01: 85 mL via INTRAVENOUS

## 2022-09-01 NOTE — Patient Instructions (Signed)

## 2022-09-06 ENCOUNTER — Inpatient Hospital Stay: Payer: Medicaid Other

## 2022-09-06 ENCOUNTER — Other Ambulatory Visit: Payer: Self-pay | Admitting: *Deleted

## 2022-09-06 ENCOUNTER — Encounter: Payer: Self-pay | Admitting: *Deleted

## 2022-09-06 ENCOUNTER — Inpatient Hospital Stay (HOSPITAL_BASED_OUTPATIENT_CLINIC_OR_DEPARTMENT_OTHER): Payer: Medicaid Other | Admitting: Oncology

## 2022-09-06 VITALS — BP 132/83 | HR 79 | Resp 20

## 2022-09-06 VITALS — BP 127/70 | HR 95 | Temp 98.1°F | Resp 18 | Ht 64.0 in | Wt 214.4 lb

## 2022-09-06 DIAGNOSIS — C250 Malignant neoplasm of head of pancreas: Secondary | ICD-10-CM

## 2022-09-06 DIAGNOSIS — Z5111 Encounter for antineoplastic chemotherapy: Secondary | ICD-10-CM | POA: Diagnosis not present

## 2022-09-06 LAB — CMP (CANCER CENTER ONLY)
ALT: 12 U/L (ref 0–44)
AST: 16 U/L (ref 15–41)
Albumin: 3.7 g/dL (ref 3.5–5.0)
Alkaline Phosphatase: 105 U/L (ref 38–126)
Anion gap: 8 (ref 5–15)
BUN: 17 mg/dL (ref 6–20)
CO2: 29 mmol/L (ref 22–32)
Calcium: 9.1 mg/dL (ref 8.9–10.3)
Chloride: 100 mmol/L (ref 98–111)
Creatinine: 1.08 mg/dL — ABNORMAL HIGH (ref 0.44–1.00)
GFR, Estimated: >60 mL/min (ref >60)
Glucose, Bld: 123 mg/dL — ABNORMAL HIGH (ref 70–99)
Potassium: 4.3 mmol/L (ref 3.5–5.1)
Sodium: 137 mmol/L (ref 135–145)
Total Bilirubin: 0.2 mg/dL — ABNORMAL LOW (ref 0.3–1.2)
Total Protein: 6.3 g/dL — ABNORMAL LOW (ref 6.5–8.1)

## 2022-09-06 LAB — CBC WITH DIFFERENTIAL (CANCER CENTER ONLY)
Abs Immature Granulocytes: 0.02 10*3/uL (ref 0.00–0.07)
Basophils Absolute: 0 10*3/uL (ref 0.0–0.1)
Basophils Relative: 0 %
Eosinophils Absolute: 0.3 10*3/uL (ref 0.0–0.5)
Eosinophils Relative: 6 %
HCT: 28.4 % — ABNORMAL LOW (ref 36.0–46.0)
Hemoglobin: 8.8 g/dL — ABNORMAL LOW (ref 12.0–15.0)
Immature Granulocytes: 0 %
Lymphocytes Relative: 23 %
Lymphs Abs: 1.1 10*3/uL (ref 0.7–4.0)
MCH: 28.4 pg (ref 26.0–34.0)
MCHC: 31 g/dL (ref 30.0–36.0)
MCV: 91.6 fL (ref 80.0–100.0)
Monocytes Absolute: 0.6 10*3/uL (ref 0.1–1.0)
Monocytes Relative: 12 %
Neutro Abs: 2.7 10*3/uL (ref 1.7–7.7)
Neutrophils Relative %: 59 %
Platelet Count: 198 10*3/uL (ref 150–400)
RBC: 3.1 MIL/uL — ABNORMAL LOW (ref 3.87–5.11)
RDW: 18.7 % — ABNORMAL HIGH (ref 11.5–15.5)
WBC Count: 4.6 10*3/uL (ref 4.0–10.5)
nRBC: 0 % (ref 0.0–0.2)

## 2022-09-06 MED ORDER — HEPARIN SOD (PORK) LOCK FLUSH 100 UNIT/ML IV SOLN
500.0000 [IU] | Freq: Once | INTRAVENOUS | Status: AC | PRN
  Administered 2022-09-06: 500 [IU]

## 2022-09-06 MED ORDER — PROCHLORPERAZINE MALEATE 10 MG PO TABS
10.0000 mg | ORAL_TABLET | Freq: Once | ORAL | Status: AC
  Administered 2022-09-06: 10 mg via ORAL
  Filled 2022-09-06: qty 1

## 2022-09-06 MED ORDER — FLUCONAZOLE 100 MG PO TABS: 100.0000 mg | ORAL_TABLET | Freq: Every day | ORAL | 3 refills | Status: DC

## 2022-09-06 MED ORDER — PACLITAXEL PROTEIN-BOUND CHEMO INJECTION 100 MG
100.0000 mg/m2 | Freq: Once | INTRAVENOUS | Status: AC
  Administered 2022-09-06: 200 mg via INTRAVENOUS
  Filled 2022-09-06: qty 40

## 2022-09-06 MED ORDER — SODIUM CHLORIDE 0.9% FLUSH
10.0000 mL | INTRAVENOUS | Status: DC | PRN
  Administered 2022-09-06: 10 mL

## 2022-09-06 MED ORDER — SODIUM CHLORIDE 0.9 % IV SOLN: Freq: Once | INTRAVENOUS | Status: AC

## 2022-09-06 MED ORDER — SODIUM CHLORIDE 0.9 % IV SOLN
1000.0000 mg/m2 | Freq: Once | INTRAVENOUS | Status: AC
  Administered 2022-09-06: 2052 mg via INTRAVENOUS
  Filled 2022-09-06: qty 52.6

## 2022-09-06 NOTE — Patient Instructions (Signed)
[facility]   Discharge Instructions: Thank you for choosing [facility] to provide your oncology and hematology care.   If you have a lab appointment with the [facility], please go directly to the [facility] and check in at the registration area.   Wear comfortable clothing and clothing appropriate for easy access to any Portacath or PICC line.   We strive to give you quality time with your provider. You may need to reschedule your appointment if you arrive late (15 or more minutes).  Arriving late affects you and other patients whose appointments are after yours.  Also, if you miss three or more appointments without notifying the office, you may be dismissed from the clinic at the provider's discretion.      For prescription refill requests, have your pharmacy contact our office and allow 72 hours for refills to be completed.    Today you received the following chemotherapy and/or immunotherapy agents Paclitaxel-protein bound (ABRAXANE) & Gemcitabine (GEMZAR).      To help prevent nausea and vomiting after your treatment, we encourage you to take your nausea medication as directed.  BELOW ARE SYMPTOMS THAT SHOULD BE REPORTED IMMEDIATELY: *FEVER GREATER THAN 100.4 F (38 C) OR HIGHER *CHILLS OR SWEATING *NAUSEA AND VOMITING THAT IS NOT CONTROLLED WITH YOUR NAUSEA MEDICATION *UNUSUAL SHORTNESS OF BREATH *UNUSUAL BRUISING OR BLEEDING *URINARY PROBLEMS (pain or burning when urinating, or frequent urination) *BOWEL PROBLEMS (unusual diarrhea, constipation, pain near the anus) TENDERNESS IN MOUTH AND THROAT WITH OR WITHOUT PRESENCE OF ULCERS (sore throat, sores in mouth, or a toothache) UNUSUAL RASH, SWELLING OR PAIN  UNUSUAL VAGINAL DISCHARGE OR ITCHING   Items with * indicate a potential emergency and should be followed up as soon as possible or go to the Emergency Department if any problems should occur.  Please show the CHEMOTHERAPY ALERT  CARD or IMMUNOTHERAPY ALERT CARD at check-in to the Emergency Department and triage nurse.  Should you have questions after your visit or need to cancel or reschedule your appointment, please contact [facility]  Dept: [phone]  and follow the prompts.  Office hours are 8:00 a.m. to 4:30 p.m. Monday - Friday. Please note that voicemails left after 4:00 p.m. may not be returned until the following business day.  We are closed weekends and major holidays. You have access to a nurse at all times for urgent questions. Please call the main number to the clinic Dept: [phone] and follow the prompts.   For any non-urgent questions, you may also contact your provider using MyChart. We now offer e-Visits for anyone 60 and older to request care online for non-urgent symptoms. For details visit [email or web address].   Also download the MyChart app! Go to the app store, search "MyChart", open the app, select [facility], and log in with your MyChart username and password.  Masks are optional in the cancer centers. If you would like for your care team to wear a mask while they are taking care of you, please let them know. You may have one support person who is at least 54 years old accompany you for your appointments.  Paclitaxel Nanoparticle Albumin-Bound Injection What is this medication? NANOPARTICLE ALBUMIN-BOUND PACLITAXEL (Na no PAHR ti kuhl al BYOO muhn-bound PAK li TAX el) treats some types of cancer. It works by slowing down the growth of cancer cells. This medicine may be used for other purposes; ask your health care provider or pharmacist if you  have questions. COMMON BRAND NAME(S): Abraxane What should I tell my care team before I take this medication? They need to know if you have any of these conditions: Liver disease Low white blood cell levels An unusual or allergic reaction to paclitaxel, albumin, other medications, foods, dyes, or preservatives If you  or your partner are pregnant or trying to get pregnant Breast-feeding How should I use this medication? This medication is injected into a vein. It is given by your care team in a hospital or clinic setting. Talk to your care team about the use of this medication in children. Special care may be needed. Overdosage: If you think you have taken too much of this medicine contact a poison control center or emergency room at once. NOTE: This medicine is only for you. Do not share this medicine with others. What if I miss a dose? Keep appointments for follow-up doses. It is important not to miss your dose. Call your care team if you are unable to keep an appointment. What may interact with this medication? Other medications may affect the way this medication works. Talk with your care team about all of the medications you take. They may suggest changes to your treatment plan to lower the risk of side effects and to make sure your medications work as intended. This list may not describe all possible interactions. Give your health care provider a list of all the medicines, herbs, non-prescription drugs, or dietary supplements you use. Also tell them if you smoke, drink alcohol, or use illegal drugs. Some items may interact with your medicine. What should I watch for while using this medication? Your condition will be monitored carefully while you are receiving this medication. You may need blood work while taking this medication. This medication may make you feel generally unwell. This is not uncommon as chemotherapy can affect healthy cells as well as cancer cells. Report any side effects. Continue your course of treatment even though you feel ill unless your care team tells you to stop. This medication can cause serious allergic reactions. To reduce the risk, your care team may give you other medications to take before receiving this one. Be sure to follow the directions from your care team. This  medication may increase your risk of getting an infection. Call your care team for advice if you get a fever, chills, sore throat, or other symptoms of a cold or flu. Do not treat yourself. Try to avoid being around people who are sick. This medication may increase your risk to bruise or bleed. Call your care team if you notice any unusual bleeding. Be careful brushing or flossing your teeth or using a toothpick because you may get an infection or bleed more easily. If you have any dental work done, tell your dentist you are receiving this medication. Talk to your care team if you or your partner may be pregnant. Serious birth defects can occur if you take this medication during pregnancy and for 6 months after the last dose. You will need a negative pregnancy test before starting this medication. Contraception is recommended while taking this medication and for 6 months after the last dose. Your care team can help you find the option that works for you. If your partner can get pregnant, use a condom during sex while taking this medication and for 3 months after the last dose. Do not breastfeed while taking this medication and for 2 weeks after the last dose. This medication may cause infertility. Talk  to your care team if you are concerned about your fertility. What side effects may I notice from receiving this medication? Side effects that you should report to your care team as soon as possible: Allergic reactions--skin rash, itching, hives, swelling of the face, lips, tongue, or throat Dry cough, shortness of breath or trouble breathing Infection--fever, chills, cough, sore throat, wounds that don't heal, pain or trouble when passing urine, general feeling of discomfort or being unwell Low red blood cell level--unusual weakness or fatigue, dizziness, headache, trouble breathing Pain, tingling, or numbness in the hands or feet Stomach pain, unusual weakness or fatigue, nausea, vomiting, diarrhea, or  fever that lasts longer than expected Unusual bruising or bleeding Side effects that usually do not require medical attention (report to your care team if they continue or are bothersome): Diarrhea Fatigue Hair loss Loss of appetite Nausea Vomiting This list may not describe all possible side effects. Call your doctor for medical advice about side effects. You may report side effects to FDA at 1-800-FDA-1088. Where should I keep my medication? This medication is given in a hospital or clinic. It will not be stored at home. NOTE: This sheet is a summary. It may not cover all possible information. If you have questions about this medicine, talk to your doctor, pharmacist, or health care provider.  2023 Elsevier/Gold Standard (2022-03-31 00:00:00)  Gemcitabine Injection What is this medication? GEMCITABINE (jem SYE ta been) treats some types of cancer. It works by slowing down the growth of cancer cells. This medicine may be used for other purposes; ask your health care provider or pharmacist if you have questions. COMMON BRAND NAME(S): Gemzar, Infugem What should I tell my care team before I take this medication? They need to know if you have any of these conditions: Blood disorders Infection Kidney disease Liver disease Lung or breathing disease, such as asthma or COPD Recent or ongoing radiation therapy An unusual or allergic reaction to gemcitabine, other medications, foods, dyes, or preservatives If you or your partner are pregnant or trying to get pregnant Breast-feeding How should I use this medication? This medication is injected into a vein. It is given by your care team in a hospital or clinic setting. Talk to your care team about the use of this medication in children. Special care may be needed. Overdosage: If you think you have taken too much of this medicine contact a poison control center or emergency room at once. NOTE: This medicine is only for you. Do not share this  medicine with others. What if I miss a dose? Keep appointments for follow-up doses. It is important not to miss your dose. Call your care team if you are unable to keep an appointment. What may interact with this medication? Interactions have not been studied. This list may not describe all possible interactions. Give your health care provider a list of all the medicines, herbs, non-prescription drugs, or dietary supplements you use. Also tell them if you smoke, drink alcohol, or use illegal drugs. Some items may interact with your medicine. What should I watch for while using this medication? Your condition will be monitored carefully while you are receiving this medication. This medication may make you feel generally unwell. This is not uncommon, as chemotherapy can affect healthy cells as well as cancer cells. Report any side effects. Continue your course of treatment even though you feel ill unless your care team tells you to stop. In some cases, you may be given additional medications to help  with side effects. Follow all directions for their use. This medication may increase your risk of getting an infection. Call your care team for advice if you get a fever, chills, sore throat, or other symptoms of a cold or flu. Do not treat yourself. Try to avoid being around people who are sick. This medication may increase your risk to bruise or bleed. Call your care team if you notice any unusual bleeding. Be careful brushing or flossing your teeth or using a toothpick because you may get an infection or bleed more easily. If you have any dental work done, tell your dentist you are receiving this medication. Avoid taking medications that contain aspirin, acetaminophen, ibuprofen, naproxen, or ketoprofen unless instructed by your care team. These medications may hide a fever. Talk to your care team if you or your partner wish to become pregnant or think you might be pregnant. This medication can cause  serious birth defects if taken during pregnancy and for 6 months after the last dose. A negative pregnancy test is required before starting this medication. A reliable form of contraception is recommended while taking this medication and for 6 months after the last dose. Talk to your care team about effective forms of contraception. Do not father a child while taking this medication and for 3 months after the last dose. Use a condom while having sex during this time period. Do not breastfeed while taking this medication and for at least 1 week after the last dose. This medication may cause infertility. Talk to your care team if you are concerned about your fertility. What side effects may I notice from receiving this medication? Side effects that you should report to your care team as soon as possible: Allergic reactions--skin rash, itching, hives, swelling of the face, lips, tongue, or throat Capillary leak syndrome--stomach or muscle pain, unusual weakness or fatigue, feeling faint or lightheaded, decrease in the amount of urine, swelling of the ankles, hands, or feet, trouble breathing Infection--fever, chills, cough, sore throat, wounds that don't heal, pain or trouble when passing urine, general feeling of discomfort or being unwell Liver injury--right upper belly pain, loss of appetite, nausea, light-colored stool, dark yellow or brown urine, yellowing skin or eyes, unusual weakness or fatigue Low red blood cell level--unusual weakness or fatigue, dizziness, headache, trouble breathing Lung injury--shortness of breath or trouble breathing, cough, spitting up blood, chest pain, fever Stomach pain, bloody diarrhea, pale skin, unusual weakness or fatigue, decrease in the amount of urine, which may be signs of hemolytic uremic syndrome Sudden and severe headache, confusion, change in vision, seizures, which may be signs of posterior reversible encephalopathy syndrome (PRES) Unusual bruising or  bleeding Side effects that usually do not require medical attention (report to your care team if they continue or are bothersome): Diarrhea Drowsiness Hair loss Nausea Pain, redness, or swelling with sores inside the mouth or throat Vomiting This list may not describe all possible side effects. Call your doctor for medical advice about side effects. You may report side effects to FDA at 1-800-FDA-1088. Where should I keep my medication? This medication is given in a hospital or clinic. It will not be stored at home. NOTE: This sheet is a summary. It may not cover all possible information. If you have questions about this medicine, talk to your doctor, pharmacist, or health care provider.  2023 Elsevier/Gold Standard (2022-03-31 00:00:00)

## 2022-09-06 NOTE — Progress Notes (Signed)
[facility] OFFICE PROGRESS NOTE   Diagnosis: Pancreas cancer  INTERVAL HISTORY:   Wendy Hoffman returns as scheduled.  She completed another treatment with gemcitabine/Abraxane on 08/23/2022.  No new complaint.  She continues to have intermittent nausea.  No emesis.  Pain is controlled with MS Contin, Roxanol, and oxycodone.  Objective:  Vital signs in last 24 hours:  Blood pressure 127/70, pulse 95, temperature 98.1 F (36.7 C), temperature source Oral, resp. rate 18, height '5\' 4"'  (1.626 m), weight 214 lb 6.4 oz (97.3 kg), SpO2 99 %.    HEENT: Thrush at the buccal mucosa and pharynx Resp: End inspiratory rhonchi at the right posterior base Cardio: Regular rate and rhythm GI: Tender in the mid and right upper abdomen, no mass, no hepatosplenomegaly Vascular: No leg edema  Skin: Palms without erythema  Portacath/PICC-without erythema  Lab Results:  Lab Results  Component Value Date   WBC 4.6 09/06/2022   HGB 8.8 (L) 09/06/2022   HCT 28.4 (L) 09/06/2022   MCV 91.6 09/06/2022   PLT 198 09/06/2022   NEUTROABS 2.7 09/06/2022    CMP  Lab Results  Component Value Date   NA 137 09/06/2022   K 4.3 09/06/2022   CL 100 09/06/2022   CO2 29 09/06/2022   GLUCOSE 123 (H) 09/06/2022   BUN 17 09/06/2022   CREATININE 1.08 (H) 09/06/2022   CALCIUM 9.1 09/06/2022   PROT 6.3 (L) 09/06/2022   ALBUMIN 3.7 09/06/2022   AST 16 09/06/2022   ALT 12 09/06/2022   ALKPHOS 105 09/06/2022   BILITOT 0.2 (L) 09/06/2022   GFRNONAA >60 09/06/2022   GFRAA >60 07/21/2020    Lab Results  Component Value Date   CEA1 68.8 (H) 02/23/2021   CEA <0.5 10/15/2008   PFX902 8,993 (H) 08/23/2022    Medications: I have reviewed the patient's current medications.   Assessment/Plan: Pancreas cancer -CT abdomen/pelvis without contrast 02/16/2021-multiple large hypoechoic masses in the patient's liver consistent metastatic disease, ill-defined masslike area in the pancreatic head  measuring approximate 4.7 cm, possible filling defect in the right ventricle. -EGD 02/19/2021-large infiltrative mass with bleeding in the second portion of the duodenum, appears to be arising near the ampulla, partially obstructive with friable mucosa.  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 -Flexible sigmoidoscopy 02/19/2021-diverticulosis in the sigmoid colon, nonbleeding external and internal hemorrhoids -Cardiac CT 02/20/2021-mass in the mid RV attached to the interventricular septum measuring 16 mm x 6 mm and concerning for metastatic tumor -MRI abdomen with/without contrast 02/21/2021-mass in the posterior pancreatic head measuring 4.3 x 3.8 cm with obstruction of the central pancreatic duct and diffuse dilatation up to 6 mm consistent with pancreatic adenocarcinoma, liver lesions the largest mass of the central left lobe measuring 9.9 x 9.2 cm consistent with hepatic metastatic disease,  hepatomegaly. -Ultrasound-guided biopsy of a left liver lesion 02/24/2021-adenocarcinoma, morphologically similar to the duodenal biopsy; microsatellite stable, tumor mutation burden 1 -Negative genetic testing -Palliative radiation to the duodenal mass 02/25/2021-03/06/2021 -Cycle 1 FOLFOX 03/17/2021 -Cycle 2 FOLFOX 04/01/2021 -Cycle 3 FOLFIRINOX 04/14/2021 -Cycle 4 FOLFIRINOX 04/27/2021, Udenyca added -Cycle 5 FOLFIRINOX 05/12/2021, Udenyca -MRI abdomen 05/23/2021-decrease in size of pancreas mass and hepatic lesions, no progressive disease -Cycle 6 FOLFIRINOX 05/27/2021 -CT 06/17/2021-multiple liver metastases similar to her prior exam.  Pancreas mass not significantly changed. -CT 06/26/2021-stable pancreas mass, no significant change in size and number of known liver metastases. -CT abdomen/pelvis 07/12/2021-new gastrostomy tube.  Multiple liver masses slightly decreased in size from prior exam.  Pancreas head mass unchanged. -Cycle 1 gemcitabine/Abraxane 07/30/2021 -Cycle 2  gemcitabine/Abraxane 08/13/2021 -Cycle 3 gemcitabine 08/27/2021, Abraxane held due to significant bilateral toe pain, question neuropathy -Cycle 4 gemcitabine, Abraxane held due to neuropathy, 09/10/2021 -Cycle 5 gemcitabine, Abraxane held due to neuropathy, 09/24/2021 -Cycle 6 gemcitabine, Abraxane held due to neuropathy 10/16/2021 -CTs 10/29/2021-mild progression of multifocal liver metastases; mild increase in size of hypoenhancing mass within the head of the pancreas; new mild pleural nodularity along the posterior lower lobes -Cycle 7 gemcitabine/Abraxane 11/03/2021 -Cycle 8 gemcitabine/Abraxane 11/23/2021 -Cycle 9 gemcitabine/Abraxane 12/10/2021 -Cycle 10 Gemcitabine/Abraxane 12/24/2021 -CT abdomen/pelvis 01/04/2022-decrease size of pancreas head mass, no change in multifocal hepatic metastases -Cycle 11 gemcitabine/Abraxane 01/07/2022 -Cycle 12 gemcitabine/Abraxane 02/04/2022 -Cycle 13 Gemcitabine/Abraxane 02/18/2022 -Cycle 14 gemcitabine/Abraxane 03/11/2022 -Cycle 15 gemcitabine/Abraxane 03/29/2022 -Cycle 16 gemcitabine/Abraxane 04/16/2022 -CT abdomen/pelvis 04/22/2022-mixed response in the liver, some lesions larger, some smaller, no change in pancreas head mass, no evidence of metastatic disease elsewhere in the abdomen or pelvis -Cycle 17 gemcitabine/Abraxane 05/17/2022 -Cycle 18 Gemcitabine/Abraxane 06/01/2022 -Cycle 19 gemcitabine/Abraxane 06/18/2022 -Cycle 20 gemcitabine/Abraxane 07/05/2022 -Cycle 21 gemcitabine/Abraxane 08/09/2022 -Cycle 22 gemcitabine/Abraxane 08/23/2022 -CT abdomen/pelvis 09/01/2022-stable liver metastases, difficult to visualize the pancreas head mass, no evidence of disease progression -Cycle 23 gemcitabine/Abraxane 09/06/2022 2.  Iron deficiency anemia-improved -Feraheme 510 mg 02/20/2021 3.  Protein calorie malnutrition 4.  Possible right ventricular filling defect on noncontrast CT scan MRI cardiac morphology 02/21/2021-mass in the mid RV attached to the interventricular  septum measures 16 mm x 6 mm.  Mass appears heterogeneous with area of enhancement on LGE imaging.  Mass is concerning for metastatic tumor.  Normal LV size and systolic function.  Normal RV size and systolic function. 5.  Hypertension 6.  History of CVA 7.  Hyperlipidemia 8.  Hypothyroidism 9.  Morbid obesity 10.  Asthma 11.  Migraines 12.  Pain secondary to #1 13.  Port-A-Cath placement 02/25/2021 14.  Hypokalemia 04/01/2021-started a potassium supplement 15.  Hospital admission 06/17/2021-intractable nausea and vomiting EGD 06/19/2021-diffuse gastritis, nonobstructing duodenal mass, biopsy of stomach-no malignancy or H. pylori, duodenal mass-adenocarcinoma 16.  Hospital admission 07/12/2021 with nausea/vomiting, abdominal pain, and hypokalemia 17.  Percutaneous gastrostomy tube placement 07/01/2021 18.  07/21/2022 bilateral lower extremity venous Doppler study-negative for DVT.    Disposition: Wendy Hoffman appears stable.  She has been maintained on gemcitabine/Abraxane since August 2022.  There is no clinical or radiologic evidence of disease progression.  I reviewed the CT findings and images with her.  I recommend continuing gemcitabine/Abraxane.  The CA 19-9 has not changed significantly over the past several months.  We will follow-up on the CA 19-9 from today.  Wendy Hoffman will complete another cycle of gemcitabine/Abraxane today.  She will return for an office visit and chemotherapy in 2 weeks.  She will continue the current narcotic pain regimen.  Wendy Hoffman will complete a course of Diflucan for oral candidiasis.    Betsy Coder, MD  09/06/2022  10:45 AM

## 2022-09-06 NOTE — Progress Notes (Signed)
Patient seen by Dr. Sherrill today ? ?Vitals are within treatment parameters. ? ?Labs reviewed by Dr. Sherrill and are within treatment parameters. ? ?Per physician team, patient is ready for treatment and there are NO modifications to the treatment plan.  ?

## 2022-09-08 LAB — CANCER ANTIGEN 19-9: CA 19-9: 8465 U/mL — ABNORMAL HIGH (ref 0–35)

## 2022-09-09 ENCOUNTER — Other Ambulatory Visit (HOSPITAL_BASED_OUTPATIENT_CLINIC_OR_DEPARTMENT_OTHER): Payer: Self-pay

## 2022-09-14 ENCOUNTER — Other Ambulatory Visit: Payer: Self-pay

## 2022-09-19 ENCOUNTER — Other Ambulatory Visit: Payer: Self-pay | Admitting: Oncology

## 2022-09-20 ENCOUNTER — Inpatient Hospital Stay: Payer: Medicaid Other

## 2022-09-20 ENCOUNTER — Encounter: Payer: Self-pay | Admitting: Nurse Practitioner

## 2022-09-20 ENCOUNTER — Inpatient Hospital Stay: Payer: Medicaid Other | Attending: Nurse Practitioner

## 2022-09-20 ENCOUNTER — Inpatient Hospital Stay (HOSPITAL_BASED_OUTPATIENT_CLINIC_OR_DEPARTMENT_OTHER): Payer: Medicaid Other | Admitting: Nurse Practitioner

## 2022-09-20 VITALS — BP 152/93 | HR 95 | Resp 18

## 2022-09-20 DIAGNOSIS — G43909 Migraine, unspecified, not intractable, without status migrainosus: Secondary | ICD-10-CM | POA: Insufficient documentation

## 2022-09-20 DIAGNOSIS — Z8673 Personal history of transient ischemic attack (TIA), and cerebral infarction without residual deficits: Secondary | ICD-10-CM | POA: Diagnosis not present

## 2022-09-20 DIAGNOSIS — Z5111 Encounter for antineoplastic chemotherapy: Secondary | ICD-10-CM | POA: Insufficient documentation

## 2022-09-20 DIAGNOSIS — C250 Malignant neoplasm of head of pancreas: Secondary | ICD-10-CM

## 2022-09-20 DIAGNOSIS — I10 Essential (primary) hypertension: Secondary | ICD-10-CM | POA: Diagnosis not present

## 2022-09-20 DIAGNOSIS — E039 Hypothyroidism, unspecified: Secondary | ICD-10-CM | POA: Insufficient documentation

## 2022-09-20 DIAGNOSIS — E876 Hypokalemia: Secondary | ICD-10-CM | POA: Diagnosis not present

## 2022-09-20 DIAGNOSIS — E46 Unspecified protein-calorie malnutrition: Secondary | ICD-10-CM | POA: Insufficient documentation

## 2022-09-20 DIAGNOSIS — J45909 Unspecified asthma, uncomplicated: Secondary | ICD-10-CM | POA: Insufficient documentation

## 2022-09-20 DIAGNOSIS — C787 Secondary malignant neoplasm of liver and intrahepatic bile duct: Secondary | ICD-10-CM | POA: Insufficient documentation

## 2022-09-20 DIAGNOSIS — E785 Hyperlipidemia, unspecified: Secondary | ICD-10-CM | POA: Diagnosis not present

## 2022-09-20 LAB — CMP (CANCER CENTER ONLY)
ALT: 12 U/L (ref 0–44)
AST: 19 U/L (ref 15–41)
Albumin: 3.8 g/dL (ref 3.5–5.0)
Alkaline Phosphatase: 119 U/L (ref 38–126)
Anion gap: 7 (ref 5–15)
BUN: 18 mg/dL (ref 6–20)
CO2: 31 mmol/L (ref 22–32)
Calcium: 9.3 mg/dL (ref 8.9–10.3)
Chloride: 103 mmol/L (ref 98–111)
Creatinine: 1 mg/dL (ref 0.44–1.00)
GFR, Estimated: >60 mL/min (ref >60)
Glucose, Bld: 95 mg/dL (ref 70–99)
Potassium: 4 mmol/L (ref 3.5–5.1)
Sodium: 141 mmol/L (ref 135–145)
Total Bilirubin: 0.3 mg/dL (ref 0.3–1.2)
Total Protein: 6.6 g/dL (ref 6.5–8.1)

## 2022-09-20 LAB — CBC WITH DIFFERENTIAL (CANCER CENTER ONLY)
Abs Immature Granulocytes: 0.01 10*3/uL (ref 0.00–0.07)
Basophils Absolute: 0 10*3/uL (ref 0.0–0.1)
Basophils Relative: 0 %
Eosinophils Absolute: 0.3 10*3/uL (ref 0.0–0.5)
Eosinophils Relative: 7 %
HCT: 28.9 % — ABNORMAL LOW (ref 36.0–46.0)
Hemoglobin: 9 g/dL — ABNORMAL LOW (ref 12.0–15.0)
Immature Granulocytes: 0 %
Lymphocytes Relative: 19 %
Lymphs Abs: 0.8 10*3/uL (ref 0.7–4.0)
MCH: 28.3 pg (ref 26.0–34.0)
MCHC: 31.1 g/dL (ref 30.0–36.0)
MCV: 90.9 fL (ref 80.0–100.0)
Monocytes Absolute: 0.5 10*3/uL (ref 0.1–1.0)
Monocytes Relative: 11 %
Neutro Abs: 2.8 10*3/uL (ref 1.7–7.7)
Neutrophils Relative %: 63 %
Platelet Count: 201 10*3/uL (ref 150–400)
RBC: 3.18 MIL/uL — ABNORMAL LOW (ref 3.87–5.11)
RDW: 18.5 % — ABNORMAL HIGH (ref 11.5–15.5)
WBC Count: 4.5 10*3/uL (ref 4.0–10.5)
nRBC: 0 % (ref 0.0–0.2)

## 2022-09-20 MED ORDER — PROCHLORPERAZINE MALEATE 10 MG PO TABS
10.0000 mg | ORAL_TABLET | Freq: Once | ORAL | Status: AC
  Administered 2022-09-20: 10 mg via ORAL
  Filled 2022-09-20: qty 1

## 2022-09-20 MED ORDER — PACLITAXEL PROTEIN-BOUND CHEMO INJECTION 100 MG
100.0000 mg/m2 | Freq: Once | INTRAVENOUS | Status: AC
  Administered 2022-09-20: 200 mg via INTRAVENOUS
  Filled 2022-09-20: qty 40

## 2022-09-20 MED ORDER — SODIUM CHLORIDE 0.9% FLUSH
10.0000 mL | INTRAVENOUS | Status: DC | PRN
  Administered 2022-09-20: 10 mL

## 2022-09-20 MED ORDER — SODIUM CHLORIDE 0.9 % IV SOLN: Freq: Once | INTRAVENOUS | Status: AC

## 2022-09-20 MED ORDER — HEPARIN SOD (PORK) LOCK FLUSH 100 UNIT/ML IV SOLN
500.0000 [IU] | Freq: Once | INTRAVENOUS | Status: AC | PRN
  Administered 2022-09-20: 500 [IU]

## 2022-09-20 MED ORDER — SODIUM CHLORIDE 0.9 % IV SOLN
2000.0000 mg | Freq: Once | INTRAVENOUS | Status: AC
  Administered 2022-09-20: 2000 mg via INTRAVENOUS
  Filled 2022-09-20: qty 52.6

## 2022-09-20 NOTE — Patient Instructions (Signed)
[facility]   Discharge Instructions: Thank you for choosing [facility] to provide your oncology and hematology care.   If you have a lab appointment with the Cancer Center, please go directly to the Cancer Center and check in at the registration area.   Wear comfortable clothing and clothing appropriate for easy access to any Portacath or PICC line.   We strive to give you quality time with your provider. You may need to reschedule your appointment if you arrive late (15 or more minutes).  Arriving late affects you and other patients whose appointments are after yours.  Also, if you miss three or more appointments without notifying the office, you may be dismissed from the clinic at the provider's discretion.      For prescription refill requests, have your pharmacy contact our office and allow 72 hours for refills to be completed.    Today you received the following chemotherapy and/or immunotherapy agents Abraxane, Gemzar      To help prevent nausea and vomiting after your treatment, we encourage you to take your nausea medication as directed.  BELOW ARE SYMPTOMS THAT SHOULD BE REPORTED IMMEDIATELY: *FEVER GREATER THAN 100.4 F (38 C) OR HIGHER *CHILLS OR SWEATING *NAUSEA AND VOMITING THAT IS NOT CONTROLLED WITH YOUR NAUSEA MEDICATION *UNUSUAL SHORTNESS OF BREATH *UNUSUAL BRUISING OR BLEEDING *URINARY PROBLEMS (pain or burning when urinating, or frequent urination) *BOWEL PROBLEMS (unusual diarrhea, constipation, pain near the anus) TENDERNESS IN MOUTH AND THROAT WITH OR WITHOUT PRESENCE OF ULCERS (sore throat, sores in mouth, or a toothache) UNUSUAL RASH, SWELLING OR PAIN  UNUSUAL VAGINAL DISCHARGE OR ITCHING   Items with * indicate a potential emergency and should be followed up as soon as possible or go to the Emergency Department if any problems should occur.  Please show the CHEMOTHERAPY ALERT CARD or IMMUNOTHERAPY ALERT CARD at  check-in to the Emergency Department and triage nurse.  Should you have questions after your visit or need to cancel or reschedule your appointment, please contact [facility]  Dept: [phone]  and follow the prompts.  Office hours are 8:00 a.m. to 4:30 p.m. Monday - Friday. Please note that voicemails left after 4:00 p.m. may not be returned until the following business day.  We are closed weekends and major holidays. You have access to a nurse at all times for urgent questions. Please call the main number to the clinic Dept: [phone] and follow the prompts.   For any non-urgent questions, you may also contact your provider using MyChart. We now offer e-Visits for anyone 18 and older to request care online for non-urgent symptoms. For details visit [email or web address].   Also download the MyChart app! Go to the app store, search "MyChart", open the app, select [facility], and log in with your MyChart username and password.  Masks are optional in the cancer centers. If you would like for your care team to wear a mask while they are taking care of you, please let them know. You may have one support person who is at least 54 years old accompany you for your appointments.  Paclitaxel Nanoparticle Albumin-Bound Injection What is this medication? NANOPARTICLE ALBUMIN-BOUND PACLITAXEL (Na no PAHR ti kuhl al BYOO muhn-bound PAK li TAX el) treats some types of cancer. It works by slowing down the growth of cancer cells. This medicine may be used for other purposes; ask your health care provider or pharmacist if you have questions. COMMON BRAND   NAME(S): Abraxane What should I tell my care team before I take this medication? They need to know if you have any of these conditions: Liver disease Low white blood cell levels An unusual or allergic reaction to paclitaxel, albumin, other medications, foods, dyes, or preservatives If you or your partner are pregnant or  trying to get pregnant Breast-feeding How should I use this medication? This medication is injected into a vein. It is given by your care team in a hospital or clinic setting. Talk to your care team about the use of this medication in children. Special care may be needed. Overdosage: If you think you have taken too much of this medicine contact a poison control center or emergency room at once. NOTE: This medicine is only for you. Do not share this medicine with others. What if I miss a dose? Keep appointments for follow-up doses. It is important not to miss your dose. Call your care team if you are unable to keep an appointment. What may interact with this medication? Other medications may affect the way this medication works. Talk with your care team about all of the medications you take. They may suggest changes to your treatment plan to lower the risk of side effects and to make sure your medications work as intended. This list may not describe all possible interactions. Give your health care provider a list of all the medicines, herbs, non-prescription drugs, or dietary supplements you use. Also tell them if you smoke, drink alcohol, or use illegal drugs. Some items may interact with your medicine. What should I watch for while using this medication? Your condition will be monitored carefully while you are receiving this medication. You may need blood work while taking this medication. This medication may make you feel generally unwell. This is not uncommon as chemotherapy can affect healthy cells as well as cancer cells. Report any side effects. Continue your course of treatment even though you feel ill unless your care team tells you to stop. This medication can cause serious allergic reactions. To reduce the risk, your care team may give you other medications to take before receiving this one. Be sure to follow the directions from your care team. This medication may increase your risk of  getting an infection. Call your care team for advice if you get a fever, chills, sore throat, or other symptoms of a cold or flu. Do not treat yourself. Try to avoid being around people who are sick. This medication may increase your risk to bruise or bleed. Call your care team if you notice any unusual bleeding. Be careful brushing or flossing your teeth or using a toothpick because you may get an infection or bleed more easily. If you have any dental work done, tell your dentist you are receiving this medication. Talk to your care team if you or your partner may be pregnant. Serious birth defects can occur if you take this medication during pregnancy and for 6 months after the last dose. You will need a negative pregnancy test before starting this medication. Contraception is recommended while taking this medication and for 6 months after the last dose. Your care team can help you find the option that works for you. If your partner can get pregnant, use a condom during sex while taking this medication and for 3 months after the last dose. Do not breastfeed while taking this medication and for 2 weeks after the last dose. This medication may cause infertility. Talk to your care team   if you are concerned about your fertility. What side effects may I notice from receiving this medication? Side effects that you should report to your care team as soon as possible: Allergic reactions--skin rash, itching, hives, swelling of the face, lips, tongue, or throat Dry cough, shortness of breath or trouble breathing Infection--fever, chills, cough, sore throat, wounds that don't heal, pain or trouble when passing urine, general feeling of discomfort or being unwell Low red blood cell level--unusual weakness or fatigue, dizziness, headache, trouble breathing Pain, tingling, or numbness in the hands or feet Stomach pain, unusual weakness or fatigue, nausea, vomiting, diarrhea, or fever that lasts longer than  expected Unusual bruising or bleeding Side effects that usually do not require medical attention (report to your care team if they continue or are bothersome): Diarrhea Fatigue Hair loss Loss of appetite Nausea Vomiting This list may not describe all possible side effects. Call your doctor for medical advice about side effects. You may report side effects to FDA at 1-800-FDA-1088. Where should I keep my medication? This medication is given in a hospital or clinic. It will not be stored at home. NOTE: This sheet is a summary. It may not cover all possible information. If you have questions about this medicine, talk to your doctor, pharmacist, or health care provider.  2023 Elsevier/Gold Standard (2022-03-31 00:00:00)  Gemcitabine Injection What is this medication? GEMCITABINE (jem SYE ta been) treats some types of cancer. It works by slowing down the growth of cancer cells. This medicine may be used for other purposes; ask your health care provider or pharmacist if you have questions. COMMON BRAND NAME(S): Gemzar, Infugem What should I tell my care team before I take this medication? They need to know if you have any of these conditions: Blood disorders Infection Kidney disease Liver disease Lung or breathing disease, such as asthma or COPD Recent or ongoing radiation therapy An unusual or allergic reaction to gemcitabine, other medications, foods, dyes, or preservatives If you or your partner are pregnant or trying to get pregnant Breast-feeding How should I use this medication? This medication is injected into a vein. It is given by your care team in a hospital or clinic setting. Talk to your care team about the use of this medication in children. Special care may be needed. Overdosage: If you think you have taken too much of this medicine contact a poison control center or emergency room at once. NOTE: This medicine is only for you. Do not share this medicine with others. What  if I miss a dose? Keep appointments for follow-up doses. It is important not to miss your dose. Call your care team if you are unable to keep an appointment. What may interact with this medication? Interactions have not been studied. This list may not describe all possible interactions. Give your health care provider a list of all the medicines, herbs, non-prescription drugs, or dietary supplements you use. Also tell them if you smoke, drink alcohol, or use illegal drugs. Some items may interact with your medicine. What should I watch for while using this medication? Your condition will be monitored carefully while you are receiving this medication. This medication may make you feel generally unwell. This is not uncommon, as chemotherapy can affect healthy cells as well as cancer cells. Report any side effects. Continue your course of treatment even though you feel ill unless your care team tells you to stop. In some cases, you may be given additional medications to help with side effects. Follow   all directions for their use. This medication may increase your risk of getting an infection. Call your care team for advice if you get a fever, chills, sore throat, or other symptoms of a cold or flu. Do not treat yourself. Try to avoid being around people who are sick. This medication may increase your risk to bruise or bleed. Call your care team if you notice any unusual bleeding. Be careful brushing or flossing your teeth or using a toothpick because you may get an infection or bleed more easily. If you have any dental work done, tell your dentist you are receiving this medication. Avoid taking medications that contain aspirin, acetaminophen, ibuprofen, naproxen, or ketoprofen unless instructed by your care team. These medications may hide a fever. Talk to your care team if you or your partner wish to become pregnant or think you might be pregnant. This medication can cause serious birth defects if taken  during pregnancy and for 6 months after the last dose. A negative pregnancy test is required before starting this medication. A reliable form of contraception is recommended while taking this medication and for 6 months after the last dose. Talk to your care team about effective forms of contraception. Do not father a child while taking this medication and for 3 months after the last dose. Use a condom while having sex during this time period. Do not breastfeed while taking this medication and for at least 1 week after the last dose. This medication may cause infertility. Talk to your care team if you are concerned about your fertility. What side effects may I notice from receiving this medication? Side effects that you should report to your care team as soon as possible: Allergic reactions--skin rash, itching, hives, swelling of the face, lips, tongue, or throat Capillary leak syndrome--stomach or muscle pain, unusual weakness or fatigue, feeling faint or lightheaded, decrease in the amount of urine, swelling of the ankles, hands, or feet, trouble breathing Infection--fever, chills, cough, sore throat, wounds that don't heal, pain or trouble when passing urine, general feeling of discomfort or being unwell Liver injury--right upper belly pain, loss of appetite, nausea, light-colored stool, dark yellow or brown urine, yellowing skin or eyes, unusual weakness or fatigue Low red blood cell level--unusual weakness or fatigue, dizziness, headache, trouble breathing Lung injury--shortness of breath or trouble breathing, cough, spitting up blood, chest pain, fever Stomach pain, bloody diarrhea, pale skin, unusual weakness or fatigue, decrease in the amount of urine, which may be signs of hemolytic uremic syndrome Sudden and severe headache, confusion, change in vision, seizures, which may be signs of posterior reversible encephalopathy syndrome (PRES) Unusual bruising or bleeding Side effects that usually do  not require medical attention (report to your care team if they continue or are bothersome): Diarrhea Drowsiness Hair loss Nausea Pain, redness, or swelling with sores inside the mouth or throat Vomiting This list may not describe all possible side effects. Call your doctor for medical advice about side effects. You may report side effects to FDA at 1-800-FDA-1088. Where should I keep my medication? This medication is given in a hospital or clinic. It will not be stored at home. NOTE: This sheet is a summary. It may not cover all possible information. If you have questions about this medicine, talk to your doctor, pharmacist, or health care provider.  2023 Elsevier/Gold Standard (2022-03-31 00:00:00)  

## 2022-09-20 NOTE — Progress Notes (Signed)
[facility] OFFICE PROGRESS NOTE   Diagnosis: Pancreas cancer  INTERVAL HISTORY:   Wendy Hoffman returns as scheduled.  She completed another cycle of gemcitabine/Abraxane 09/06/2022.  She had mild nausea for about 3 days after the chemotherapy, few episodes of emesis.  No different than with previous cycles.  No mouth sores.  No diarrhea or constipation.  She continues to have intermittent abdominal pain.  She feels "blah" today.  She is tired.  No fever.  Objective:  Vital signs in last 24 hours:  Blood pressure (!) 160/92, pulse 90, temperature 98.1 F (36.7 C), temperature source Oral, resp. rate 20, height '5\' 4"'  (1.626 m), weight 215 lb 12.8 oz (97.9 kg), SpO2 100 %.    HEENT: Mild white coating over tongue.  No buccal thrush. Resp: Lungs clear bilaterally. Cardio: Regular rate and rhythm. GI: Abdomen is soft.  Tender at the right upper abdomen.  No hepatomegaly. Vascular: No leg edema.   Neuro: Alert and oriented. Skin: No rash. Port-A-Cath without erythema.  Lab Results:  Lab Results  Component Value Date   WBC 4.6 09/06/2022   HGB 8.8 (L) 09/06/2022   HCT 28.4 (L) 09/06/2022   MCV 91.6 09/06/2022   PLT 198 09/06/2022   NEUTROABS 2.7 09/06/2022    Imaging:  No results found.  Medications: I have reviewed the patient's current medications.  Assessment/Plan: Pancreas cancer -CT abdomen/pelvis without contrast 02/16/2021-multiple large hypoechoic masses in the patient's liver consistent metastatic disease, ill-defined masslike area in the pancreatic head measuring approximate 4.7 cm, possible filling defect in the right ventricle. -EGD 02/19/2021-large infiltrative mass with bleeding in the second portion of the duodenum, appears to be arising near the ampulla, partially obstructive with friable mucosa.  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 -Flexible sigmoidoscopy 02/19/2021-diverticulosis in the sigmoid colon,  nonbleeding external and internal hemorrhoids -Cardiac CT 02/20/2021-mass in the mid RV attached to the interventricular septum measuring 16 mm x 6 mm and concerning for metastatic tumor -MRI abdomen with/without contrast 02/21/2021-mass in the posterior pancreatic head measuring 4.3 x 3.8 cm with obstruction of the central pancreatic duct and diffuse dilatation up to 6 mm consistent with pancreatic adenocarcinoma, liver lesions the largest mass of the central left lobe measuring 9.9 x 9.2 cm consistent with hepatic metastatic disease,  hepatomegaly. -Ultrasound-guided biopsy of a left liver lesion 02/24/2021-adenocarcinoma, morphologically similar to the duodenal biopsy; microsatellite stable, tumor mutation burden 1 -Negative genetic testing -Palliative radiation to the duodenal mass 02/25/2021-03/06/2021 -Cycle 1 FOLFOX 03/17/2021 -Cycle 2 FOLFOX 04/01/2021 -Cycle 3 FOLFIRINOX 04/14/2021 -Cycle 4 FOLFIRINOX 04/27/2021, Udenyca added -Cycle 5 FOLFIRINOX 05/12/2021, Udenyca -MRI abdomen 05/23/2021-decrease in size of pancreas mass and hepatic lesions, no progressive disease -Cycle 6 FOLFIRINOX 05/27/2021 -CT 06/17/2021-multiple liver metastases similar to her prior exam.  Pancreas mass not significantly changed. -CT 06/26/2021-stable pancreas mass, no significant change in size and number of known liver metastases. -CT abdomen/pelvis 07/12/2021-new gastrostomy tube.  Multiple liver masses slightly decreased in size from prior exam.  Pancreas head mass unchanged. -Cycle 1 gemcitabine/Abraxane 07/30/2021 -Cycle 2 gemcitabine/Abraxane 08/13/2021 -Cycle 3 gemcitabine 08/27/2021, Abraxane held due to significant bilateral toe pain, question neuropathy -Cycle 4 gemcitabine, Abraxane held due to neuropathy, 09/10/2021 -Cycle 5 gemcitabine, Abraxane held due to neuropathy, 09/24/2021 -Cycle 6 gemcitabine, Abraxane held due to neuropathy 10/16/2021 -CTs 10/29/2021-mild progression of multifocal liver metastases; mild increase  in size of hypoenhancing mass within the head of the pancreas; new mild pleural nodularity along the posterior lower lobes -Cycle 7 gemcitabine/Abraxane  11/03/2021 -Cycle 8 gemcitabine/Abraxane 11/23/2021 -Cycle 9 gemcitabine/Abraxane 12/10/2021 -Cycle 10 Gemcitabine/Abraxane 12/24/2021 -CT abdomen/pelvis 01/04/2022-decrease size of pancreas head mass, no change in multifocal hepatic metastases -Cycle 11 gemcitabine/Abraxane 01/07/2022 -Cycle 12 gemcitabine/Abraxane 02/04/2022 -Cycle 13 Gemcitabine/Abraxane 02/18/2022 -Cycle 14 gemcitabine/Abraxane 03/11/2022 -Cycle 15 gemcitabine/Abraxane 03/29/2022 -Cycle 16 gemcitabine/Abraxane 04/16/2022 -CT abdomen/pelvis 04/22/2022-mixed response in the liver, some lesions larger, some smaller, no change in pancreas head mass, no evidence of metastatic disease elsewhere in the abdomen or pelvis -Cycle 17 gemcitabine/Abraxane 05/17/2022 -Cycle 18 Gemcitabine/Abraxane 06/01/2022 -Cycle 19 gemcitabine/Abraxane 06/18/2022 -Cycle 20 gemcitabine/Abraxane 07/05/2022 -Cycle 21 gemcitabine/Abraxane 08/09/2022 -Cycle 22 gemcitabine/Abraxane 08/23/2022 -CT abdomen/pelvis 09/01/2022-stable liver metastases, difficult to visualize the pancreas head mass, no evidence of disease progression -Cycle 23 gemcitabine/Abraxane 09/06/2022 -Cycle 24 gemcitabine/Abraxane 09/20/2022 2.  Iron deficiency anemia-improved -Feraheme 510 mg 02/20/2021 3.  Protein calorie malnutrition 4.  Possible right ventricular filling defect on noncontrast CT scan MRI cardiac morphology 02/21/2021-mass in the mid RV attached to the interventricular septum measures 16 mm x 6 mm.  Mass appears heterogeneous with area of enhancement on LGE imaging.  Mass is concerning for metastatic tumor.  Normal LV size and systolic function.  Normal RV size and systolic function. 5.  Hypertension 6.  History of CVA 7.  Hyperlipidemia 8.  Hypothyroidism 9.  Morbid obesity 10.  Asthma 11.  Migraines 12.  Pain secondary to  #1 13.  Port-A-Cath placement 02/25/2021 14.  Hypokalemia 04/01/2021-started a potassium supplement 15.  Hospital admission 06/17/2021-intractable nausea and vomiting EGD 06/19/2021-diffuse gastritis, nonobstructing duodenal mass, biopsy of stomach-no malignancy or H. pylori, duodenal mass-adenocarcinoma 16.  Hospital admission 07/12/2021 with nausea/vomiting, abdominal pain, and hypokalemia 17.  Percutaneous gastrostomy tube placement 07/01/2021 18.  07/21/2022 bilateral lower extremity venous Doppler study-negative for DVT.        Disposition: Wendy Hoffman appears unchanged.  She is on active treatment with gemcitabine/Abraxane.  Plan to proceed with cycle 24 today as scheduled.  CBC and chemistry panel reviewed.  Labs adequate to proceed as above.  She will return for lab, follow-up, chemotherapy in 2 weeks.  She will contact the office in the interim with any problems.   [name]   09/20/2022  11:22 AM

## 2022-09-20 NOTE — Progress Notes (Signed)
Patient seen by Ned Card NP today  Vitals are not all within treatment parameters. B/P is 152/88 . It's ok to treat.  Labs reviewed by Ned Card NP and are within treatment parameters.  Per physician team, patient is ready for treatment and there are NO modifications to the treatment plan.

## 2022-09-20 NOTE — Patient Instructions (Signed)

## 2022-09-21 LAB — CANCER ANTIGEN 19-9: CA 19-9: 9071 U/mL — ABNORMAL HIGH (ref 0–35)

## 2022-09-23 ENCOUNTER — Other Ambulatory Visit: Payer: Self-pay | Admitting: *Deleted

## 2022-09-23 ENCOUNTER — Other Ambulatory Visit: Payer: Self-pay | Admitting: Oncology

## 2022-09-23 DIAGNOSIS — C259 Malignant neoplasm of pancreas, unspecified: Secondary | ICD-10-CM

## 2022-09-23 DIAGNOSIS — C250 Malignant neoplasm of head of pancreas: Secondary | ICD-10-CM

## 2022-09-23 MED ORDER — MORPHINE SULFATE ER 60 MG PO TBCR: 60.0000 mg | EXTENDED_RELEASE_TABLET | Freq: Two times a day (BID) | ORAL | 0 refills | Status: DC

## 2022-09-23 MED ORDER — OXYCODONE HCL 5 MG PO TABS: 5.0000 mg | ORAL_TABLET | ORAL | 0 refills | Status: DC | PRN

## 2022-09-23 MED ORDER — SCOPOLAMINE 1 MG/3DAYS TD PT72: 1.0000 | MEDICATED_PATCH | TRANSDERMAL | 3 refills | Status: DC

## 2022-09-23 NOTE — Telephone Encounter (Signed)
Left VM that she needs MS Contin, Oxycodone and scopolamine patch scripts called to her [facility] on [address] in [place]. MD notified.

## 2022-09-29 ENCOUNTER — Encounter: Payer: Self-pay | Admitting: *Deleted

## 2022-09-29 ENCOUNTER — Other Ambulatory Visit: Payer: Self-pay | Admitting: Nurse Practitioner

## 2022-09-29 DIAGNOSIS — C250 Malignant neoplasm of head of pancreas: Secondary | ICD-10-CM

## 2022-09-29 NOTE — Progress Notes (Signed)
Received refill request for prochlorperazine 10 mg today. Confirmed w/pharmacy that she has exhausted her #2 refills.

## 2022-10-03 ENCOUNTER — Other Ambulatory Visit: Payer: Self-pay | Admitting: Oncology

## 2022-10-04 ENCOUNTER — Inpatient Hospital Stay: Payer: Medicaid Other | Admitting: Nurse Practitioner

## 2022-10-04 ENCOUNTER — Inpatient Hospital Stay: Payer: Medicaid Other

## 2022-10-09 ENCOUNTER — Other Ambulatory Visit: Payer: Self-pay

## 2022-10-16 ENCOUNTER — Other Ambulatory Visit: Payer: Self-pay | Admitting: Oncology

## 2022-10-16 DIAGNOSIS — C250 Malignant neoplasm of head of pancreas: Secondary | ICD-10-CM

## 2022-10-18 ENCOUNTER — Inpatient Hospital Stay: Payer: Medicaid Other

## 2022-10-18 ENCOUNTER — Other Ambulatory Visit: Payer: Self-pay

## 2022-10-18 ENCOUNTER — Encounter: Payer: Self-pay | Admitting: *Deleted

## 2022-10-18 ENCOUNTER — Other Ambulatory Visit: Payer: Self-pay | Admitting: Oncology

## 2022-10-18 ENCOUNTER — Inpatient Hospital Stay: Payer: Medicaid Other | Attending: Nurse Practitioner | Admitting: Oncology

## 2022-10-18 ENCOUNTER — Other Ambulatory Visit: Payer: Self-pay | Admitting: *Deleted

## 2022-10-18 VITALS — BP 122/79 | HR 96 | Temp 98.2°F | Resp 20 | Ht 64.0 in | Wt 218.0 lb

## 2022-10-18 VITALS — BP 150/93 | HR 74

## 2022-10-18 DIAGNOSIS — E039 Hypothyroidism, unspecified: Secondary | ICD-10-CM | POA: Diagnosis not present

## 2022-10-18 DIAGNOSIS — C259 Malignant neoplasm of pancreas, unspecified: Secondary | ICD-10-CM

## 2022-10-18 DIAGNOSIS — J45909 Unspecified asthma, uncomplicated: Secondary | ICD-10-CM | POA: Insufficient documentation

## 2022-10-18 DIAGNOSIS — B370 Candidal stomatitis: Secondary | ICD-10-CM | POA: Diagnosis not present

## 2022-10-18 DIAGNOSIS — I10 Essential (primary) hypertension: Secondary | ICD-10-CM | POA: Insufficient documentation

## 2022-10-18 DIAGNOSIS — D509 Iron deficiency anemia, unspecified: Secondary | ICD-10-CM | POA: Diagnosis not present

## 2022-10-18 DIAGNOSIS — E876 Hypokalemia: Secondary | ICD-10-CM | POA: Diagnosis not present

## 2022-10-18 DIAGNOSIS — C250 Malignant neoplasm of head of pancreas: Secondary | ICD-10-CM

## 2022-10-18 DIAGNOSIS — C787 Secondary malignant neoplasm of liver and intrahepatic bile duct: Secondary | ICD-10-CM | POA: Insufficient documentation

## 2022-10-18 DIAGNOSIS — E785 Hyperlipidemia, unspecified: Secondary | ICD-10-CM | POA: Diagnosis not present

## 2022-10-18 DIAGNOSIS — G629 Polyneuropathy, unspecified: Secondary | ICD-10-CM | POA: Insufficient documentation

## 2022-10-18 DIAGNOSIS — Z5111 Encounter for antineoplastic chemotherapy: Secondary | ICD-10-CM | POA: Diagnosis present

## 2022-10-18 DIAGNOSIS — R918 Other nonspecific abnormal finding of lung field: Secondary | ICD-10-CM | POA: Diagnosis not present

## 2022-10-18 DIAGNOSIS — R110 Nausea: Secondary | ICD-10-CM

## 2022-10-18 DIAGNOSIS — R3911 Hesitancy of micturition: Secondary | ICD-10-CM | POA: Diagnosis not present

## 2022-10-18 DIAGNOSIS — J4540 Moderate persistent asthma, uncomplicated: Secondary | ICD-10-CM

## 2022-10-18 DIAGNOSIS — Z8673 Personal history of transient ischemic attack (TIA), and cerebral infarction without residual deficits: Secondary | ICD-10-CM | POA: Insufficient documentation

## 2022-10-18 DIAGNOSIS — E46 Unspecified protein-calorie malnutrition: Secondary | ICD-10-CM | POA: Insufficient documentation

## 2022-10-18 DIAGNOSIS — G43909 Migraine, unspecified, not intractable, without status migrainosus: Secondary | ICD-10-CM | POA: Diagnosis not present

## 2022-10-18 LAB — CMP (CANCER CENTER ONLY)
ALT: 19 U/L (ref 0–44)
AST: 20 U/L (ref 15–41)
Albumin: 3.6 g/dL (ref 3.5–5.0)
Alkaline Phosphatase: 132 U/L — ABNORMAL HIGH (ref 38–126)
Anion gap: 8 (ref 5–15)
BUN: 20 mg/dL (ref 6–20)
CO2: 28 mmol/L (ref 22–32)
Calcium: 9.4 mg/dL (ref 8.9–10.3)
Chloride: 102 mmol/L (ref 98–111)
Creatinine: 0.93 mg/dL (ref 0.44–1.00)
GFR, Estimated: >60 mL/min (ref >60)
Glucose, Bld: 118 mg/dL — ABNORMAL HIGH (ref 70–99)
Potassium: 3.5 mmol/L (ref 3.5–5.1)
Sodium: 138 mmol/L (ref 135–145)
Total Bilirubin: 0.3 mg/dL (ref 0.3–1.2)
Total Protein: 6.7 g/dL (ref 6.5–8.1)

## 2022-10-18 LAB — CBC WITH DIFFERENTIAL (CANCER CENTER ONLY)
Abs Immature Granulocytes: 0.02 10*3/uL (ref 0.00–0.07)
Basophils Absolute: 0 10*3/uL (ref 0.0–0.1)
Basophils Relative: 0 %
Eosinophils Absolute: 0.2 10*3/uL (ref 0.0–0.5)
Eosinophils Relative: 3 %
HCT: 27.3 % — ABNORMAL LOW (ref 36.0–46.0)
Hemoglobin: 8.6 g/dL — ABNORMAL LOW (ref 12.0–15.0)
Immature Granulocytes: 0 %
Lymphocytes Relative: 15 %
Lymphs Abs: 0.9 10*3/uL (ref 0.7–4.0)
MCH: 28.3 pg (ref 26.0–34.0)
MCHC: 31.5 g/dL (ref 30.0–36.0)
MCV: 89.8 fL (ref 80.0–100.0)
Monocytes Absolute: 0.5 10*3/uL (ref 0.1–1.0)
Monocytes Relative: 9 %
Neutro Abs: 4.4 10*3/uL (ref 1.7–7.7)
Neutrophils Relative %: 73 %
Platelet Count: 250 10*3/uL (ref 150–400)
RBC: 3.04 MIL/uL — ABNORMAL LOW (ref 3.87–5.11)
RDW: 17.8 % — ABNORMAL HIGH (ref 11.5–15.5)
WBC Count: 6 10*3/uL (ref 4.0–10.5)
nRBC: 0 % (ref 0.0–0.2)

## 2022-10-18 MED ORDER — SYMBICORT 160-4.5 MCG/ACT IN AERO: 2.0000 | INHALATION_SPRAY | Freq: Two times a day (BID) | RESPIRATORY_TRACT | 2 refills | Status: DC

## 2022-10-18 MED ORDER — ALBUTEROL SULFATE HFA 108 (90 BASE) MCG/ACT IN AERS: INHALATION_SPRAY | RESPIRATORY_TRACT | 3 refills | Status: DC

## 2022-10-18 MED ORDER — SODIUM CHLORIDE 0.9% FLUSH
10.0000 mL | INTRAVENOUS | Status: DC | PRN
  Administered 2022-10-18: 10 mL

## 2022-10-18 MED ORDER — ONDANSETRON HCL 8 MG PO TABS: 8.0000 mg | ORAL_TABLET | Freq: Three times a day (TID) | ORAL | 2 refills | Status: DC | PRN

## 2022-10-18 MED ORDER — HEPARIN SOD (PORK) LOCK FLUSH 100 UNIT/ML IV SOLN
500.0000 [IU] | Freq: Once | INTRAVENOUS | Status: AC | PRN
  Administered 2022-10-18: 500 [IU]

## 2022-10-18 MED ORDER — PANTOPRAZOLE SODIUM 40 MG PO TBEC: DELAYED_RELEASE_TABLET | ORAL | 2 refills | Status: DC

## 2022-10-18 MED ORDER — ONDANSETRON HCL 8 MG PO TABS: 8.0000 mg | ORAL_TABLET | Freq: Once | ORAL | Status: DC

## 2022-10-18 MED ORDER — SODIUM CHLORIDE 0.9 % IV SOLN: Freq: Once | INTRAVENOUS | Status: AC

## 2022-10-18 MED ORDER — MORPHINE SULFATE (CONCENTRATE) 10 MG/0.5ML PO SOLN: 20.0000 mg | Freq: Three times a day (TID) | ORAL | 0 refills | Status: DC | PRN

## 2022-10-18 MED ORDER — ONDANSETRON HCL 4 MG/2ML IJ SOLN
8.0000 mg | Freq: Once | INTRAMUSCULAR | Status: AC
  Administered 2022-10-18: 8 mg via INTRAVENOUS
  Filled 2022-10-18: qty 4

## 2022-10-18 MED ORDER — OXYCODONE HCL 10 MG PO TABS: 10.0000 mg | ORAL_TABLET | ORAL | 0 refills | Status: DC | PRN

## 2022-10-18 MED ORDER — ZOLPIDEM TARTRATE 10 MG PO TABS: 10.0000 mg | ORAL_TABLET | Freq: Every evening | ORAL | 0 refills | Status: DC | PRN

## 2022-10-18 MED ORDER — SODIUM CHLORIDE 0.9 % IV SOLN
2000.0000 mg | Freq: Once | INTRAVENOUS | Status: AC
  Administered 2022-10-18: 2000 mg via INTRAVENOUS
  Filled 2022-10-18: qty 52.6

## 2022-10-18 MED ORDER — MORPHINE SULFATE ER 60 MG PO TBCR: 60.0000 mg | EXTENDED_RELEASE_TABLET | Freq: Two times a day (BID) | ORAL | 0 refills | Status: DC

## 2022-10-18 MED ORDER — PROCHLORPERAZINE MALEATE 10 MG PO TABS
10.0000 mg | ORAL_TABLET | Freq: Once | ORAL | Status: AC
  Administered 2022-10-18: 10 mg via ORAL
  Filled 2022-10-18: qty 1

## 2022-10-18 MED ORDER — PACLITAXEL PROTEIN-BOUND CHEMO INJECTION 100 MG
100.0000 mg/m2 | Freq: Once | INTRAVENOUS | Status: AC
  Administered 2022-10-18: 200 mg via INTRAVENOUS
  Filled 2022-10-18: qty 40

## 2022-10-18 NOTE — Patient Instructions (Signed)
[facility]   Discharge Instructions: Thank you for choosing [facility] to provide your oncology and hematology care.   If you have a lab appointment with the Cancer Center, please go directly to the Cancer Center and check in at the registration area.   Wear comfortable clothing and clothing appropriate for easy access to any Portacath or PICC line.   We strive to give you quality time with your provider. You may need to reschedule your appointment if you arrive late (15 or more minutes).  Arriving late affects you and other patients whose appointments are after yours.  Also, if you miss three or more appointments without notifying the office, you may be dismissed from the clinic at the provider's discretion.      For prescription refill requests, have your pharmacy contact our office and allow 72 hours for refills to be completed.    Today you received the following chemotherapy and/or immunotherapy agents Abraxane, Gemzar      To help prevent nausea and vomiting after your treatment, we encourage you to take your nausea medication as directed.  BELOW ARE SYMPTOMS THAT SHOULD BE REPORTED IMMEDIATELY: *FEVER GREATER THAN 100.4 F (38 C) OR HIGHER *CHILLS OR SWEATING *NAUSEA AND VOMITING THAT IS NOT CONTROLLED WITH YOUR NAUSEA MEDICATION *UNUSUAL SHORTNESS OF BREATH *UNUSUAL BRUISING OR BLEEDING *URINARY PROBLEMS (pain or burning when urinating, or frequent urination) *BOWEL PROBLEMS (unusual diarrhea, constipation, pain near the anus) TENDERNESS IN MOUTH AND THROAT WITH OR WITHOUT PRESENCE OF ULCERS (sore throat, sores in mouth, or a toothache) UNUSUAL RASH, SWELLING OR PAIN  UNUSUAL VAGINAL DISCHARGE OR ITCHING   Items with * indicate a potential emergency and should be followed up as soon as possible or go to the Emergency Department if any problems should occur.  Please show the CHEMOTHERAPY ALERT CARD or IMMUNOTHERAPY ALERT CARD at  check-in to the Emergency Department and triage nurse.  Should you have questions after your visit or need to cancel or reschedule your appointment, please contact [facility]  Dept: [phone]  and follow the prompts.  Office hours are 8:00 a.m. to 4:30 p.m. Monday - Friday. Please note that voicemails left after 4:00 p.m. may not be returned until the following business day.  We are closed weekends and major holidays. You have access to a nurse at all times for urgent questions. Please call the main number to the clinic Dept: [phone] and follow the prompts.   For any non-urgent questions, you may also contact your provider using MyChart. We now offer e-Visits for anyone 18 and older to request care online for non-urgent symptoms. For details visit [email or web address].   Also download the MyChart app! Go to the app store, search "MyChart", open the app, select [facility], and log in with your MyChart username and password.  Masks are optional in the cancer centers. If you would like for your care team to wear a mask while they are taking care of you, please let them know. You may have one support person who is at least 54 years old accompany you for your appointments.  Paclitaxel Nanoparticle Albumin-Bound Injection What is this medication? NANOPARTICLE ALBUMIN-BOUND PACLITAXEL (Na no PAHR ti kuhl al BYOO muhn-bound PAK li TAX el) treats some types of cancer. It works by slowing down the growth of cancer cells. This medicine may be used for other purposes; ask your health care provider or pharmacist if you have questions. COMMON BRAND   NAME(S): Abraxane What should I tell my care team before I take this medication? They need to know if you have any of these conditions: Liver disease Low white blood cell levels An unusual or allergic reaction to paclitaxel, albumin, other medications, foods, dyes, or preservatives If you or your partner are pregnant or  trying to get pregnant Breast-feeding How should I use this medication? This medication is injected into a vein. It is given by your care team in a hospital or clinic setting. Talk to your care team about the use of this medication in children. Special care may be needed. Overdosage: If you think you have taken too much of this medicine contact a poison control center or emergency room at once. NOTE: This medicine is only for you. Do not share this medicine with others. What if I miss a dose? Keep appointments for follow-up doses. It is important not to miss your dose. Call your care team if you are unable to keep an appointment. What may interact with this medication? Other medications may affect the way this medication works. Talk with your care team about all of the medications you take. They may suggest changes to your treatment plan to lower the risk of side effects and to make sure your medications work as intended. This list may not describe all possible interactions. Give your health care provider a list of all the medicines, herbs, non-prescription drugs, or dietary supplements you use. Also tell them if you smoke, drink alcohol, or use illegal drugs. Some items may interact with your medicine. What should I watch for while using this medication? Your condition will be monitored carefully while you are receiving this medication. You may need blood work while taking this medication. This medication may make you feel generally unwell. This is not uncommon as chemotherapy can affect healthy cells as well as cancer cells. Report any side effects. Continue your course of treatment even though you feel ill unless your care team tells you to stop. This medication can cause serious allergic reactions. To reduce the risk, your care team may give you other medications to take before receiving this one. Be sure to follow the directions from your care team. This medication may increase your risk of  getting an infection. Call your care team for advice if you get a fever, chills, sore throat, or other symptoms of a cold or flu. Do not treat yourself. Try to avoid being around people who are sick. This medication may increase your risk to bruise or bleed. Call your care team if you notice any unusual bleeding. Be careful brushing or flossing your teeth or using a toothpick because you may get an infection or bleed more easily. If you have any dental work done, tell your dentist you are receiving this medication. Talk to your care team if you or your partner may be pregnant. Serious birth defects can occur if you take this medication during pregnancy and for 6 months after the last dose. You will need a negative pregnancy test before starting this medication. Contraception is recommended while taking this medication and for 6 months after the last dose. Your care team can help you find the option that works for you. If your partner can get pregnant, use a condom during sex while taking this medication and for 3 months after the last dose. Do not breastfeed while taking this medication and for 2 weeks after the last dose. This medication may cause infertility. Talk to your care team   if you are concerned about your fertility. What side effects may I notice from receiving this medication? Side effects that you should report to your care team as soon as possible: Allergic reactions--skin rash, itching, hives, swelling of the face, lips, tongue, or throat Dry cough, shortness of breath or trouble breathing Infection--fever, chills, cough, sore throat, wounds that don't heal, pain or trouble when passing urine, general feeling of discomfort or being unwell Low red blood cell level--unusual weakness or fatigue, dizziness, headache, trouble breathing Pain, tingling, or numbness in the hands or feet Stomach pain, unusual weakness or fatigue, nausea, vomiting, diarrhea, or fever that lasts longer than  expected Unusual bruising or bleeding Side effects that usually do not require medical attention (report to your care team if they continue or are bothersome): Diarrhea Fatigue Hair loss Loss of appetite Nausea Vomiting This list may not describe all possible side effects. Call your doctor for medical advice about side effects. You may report side effects to FDA at 1-800-FDA-1088. Where should I keep my medication? This medication is given in a hospital or clinic. It will not be stored at home. NOTE: This sheet is a summary. It may not cover all possible information. If you have questions about this medicine, talk to your doctor, pharmacist, or health care provider.  2023 Elsevier/Gold Standard (2008-01-20 00:00:00)  Gemcitabine Injection What is this medication? GEMCITABINE (jem SYE ta been) treats some types of cancer. It works by slowing down the growth of cancer cells. This medicine may be used for other purposes; ask your health care provider or pharmacist if you have questions. COMMON BRAND NAME(S): Gemzar, Infugem What should I tell my care team before I take this medication? They need to know if you have any of these conditions: Blood disorders Infection Kidney disease Liver disease Lung or breathing disease, such as asthma or COPD Recent or ongoing radiation therapy An unusual or allergic reaction to gemcitabine, other medications, foods, dyes, or preservatives If you or your partner are pregnant or trying to get pregnant Breast-feeding How should I use this medication? This medication is injected into a vein. It is given by your care team in a hospital or clinic setting. Talk to your care team about the use of this medication in children. Special care may be needed. Overdosage: If you think you have taken too much of this medicine contact a poison control center or emergency room at once. NOTE: This medicine is only for you. Do not share this medicine with others. What  if I miss a dose? Keep appointments for follow-up doses. It is important not to miss your dose. Call your care team if you are unable to keep an appointment. What may interact with this medication? Interactions have not been studied. This list may not describe all possible interactions. Give your health care provider a list of all the medicines, herbs, non-prescription drugs, or dietary supplements you use. Also tell them if you smoke, drink alcohol, or use illegal drugs. Some items may interact with your medicine. What should I watch for while using this medication? Your condition will be monitored carefully while you are receiving this medication. This medication may make you feel generally unwell. This is not uncommon, as chemotherapy can affect healthy cells as well as cancer cells. Report any side effects. Continue your course of treatment even though you feel ill unless your care team tells you to stop. In some cases, you may be given additional medications to help with side effects. Follow  all directions for their use. This medication may increase your risk of getting an infection. Call your care team for advice if you get a fever, chills, sore throat, or other symptoms of a cold or flu. Do not treat yourself. Try to avoid being around people who are sick. This medication may increase your risk to bruise or bleed. Call your care team if you notice any unusual bleeding. Be careful brushing or flossing your teeth or using a toothpick because you may get an infection or bleed more easily. If you have any dental work done, tell your dentist you are receiving this medication. Avoid taking medications that contain aspirin, acetaminophen, ibuprofen, naproxen, or ketoprofen unless instructed by your care team. These medications may hide a fever. Talk to your care team if you or your partner wish to become pregnant or think you might be pregnant. This medication can cause serious birth defects if taken  during pregnancy and for 6 months after the last dose. A negative pregnancy test is required before starting this medication. A reliable form of contraception is recommended while taking this medication and for 6 months after the last dose. Talk to your care team about effective forms of contraception. Do not father a child while taking this medication and for 3 months after the last dose. Use a condom while having sex during this time period. Do not breastfeed while taking this medication and for at least 1 week after the last dose. This medication may cause infertility. Talk to your care team if you are concerned about your fertility. What side effects may I notice from receiving this medication? Side effects that you should report to your care team as soon as possible: Allergic reactions--skin rash, itching, hives, swelling of the face, lips, tongue, or throat Capillary leak syndrome--stomach or muscle pain, unusual weakness or fatigue, feeling faint or lightheaded, decrease in the amount of urine, swelling of the ankles, hands, or feet, trouble breathing Infection--fever, chills, cough, sore throat, wounds that don't heal, pain or trouble when passing urine, general feeling of discomfort or being unwell Liver injury--right upper belly pain, loss of appetite, nausea, light-colored stool, dark yellow or brown urine, yellowing skin or eyes, unusual weakness or fatigue Low red blood cell level--unusual weakness or fatigue, dizziness, headache, trouble breathing Lung injury--shortness of breath or trouble breathing, cough, spitting up blood, chest pain, fever Stomach pain, bloody diarrhea, pale skin, unusual weakness or fatigue, decrease in the amount of urine, which may be signs of hemolytic uremic syndrome Sudden and severe headache, confusion, change in vision, seizures, which may be signs of posterior reversible encephalopathy syndrome (PRES) Unusual bruising or bleeding Side effects that usually do  not require medical attention (report to your care team if they continue or are bothersome): Diarrhea Drowsiness Hair loss Nausea Pain, redness, or swelling with sores inside the mouth or throat Vomiting This list may not describe all possible side effects. Call your doctor for medical advice about side effects. You may report side effects to FDA at 1-800-FDA-1088. Where should I keep my medication? This medication is given in a hospital or clinic. It will not be stored at home. NOTE: This sheet is a summary. It may not cover all possible information. If you have questions about this medicine, talk to your doctor, pharmacist, or health care provider.  2023 Elsevier/Gold Standard (2008-01-20 00:00:00)

## 2022-10-18 NOTE — Progress Notes (Signed)
[facility] OFFICE PROGRESS NOTE   Diagnosis: Pancreas cancer  INTERVAL HISTORY:   Wendy Hoffman continues every 2-week gemcitabine/paclitaxel, last given on 09/20/2022.  No fever or rash.  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  She has intermittent nausea.  No vomiting.  She reports increased upper abdominal pain for the past 2 months.  She has been taking 30 mg of liquid morphine and 15 mg of oxycodone for breakthrough pain.  This does not relieve the pain.  She continues MS Contin.  She has difficulty with urinary hesitancy.  Objective:  Vital signs in last 24 hours:  Blood pressure 122/79, pulse 96, temperature 98.2 F (36.8 C), temperature source Oral, resp. rate 20, height _0  (1.626 m), weight 218 lb (98.9 kg), SpO2 100 %.    HEENT: Thrush over the tongue and pharynx  Resp: Lungs clear bilaterally Cardio: Regular rate and rhythm GI: Tender throughout the upper abdomen, no mass, no hepatosplenomegaly Vascular: No leg edema  Skin: Round less than 1 cm hyperpigmented lesions over the extremities  Portacath/PICC-without erythema  Lab Results:  Lab Results  Component Value Date   WBC 6.0 10/18/2022   HGB 8.6 (L) 10/18/2022   HCT 27.3 (L) 10/18/2022   MCV 89.8 10/18/2022   PLT 250 10/18/2022   NEUTROABS 4.4 10/18/2022    CMP  Lab Results  Component Value Date   NA 138 10/18/2022   K 3.5 10/18/2022   CL 102 10/18/2022   CO2 28 10/18/2022   GLUCOSE 118 (H) 10/18/2022   BUN 20 10/18/2022   CREATININE 0.93 10/18/2022   CALCIUM 9.4 10/18/2022   PROT 6.7 10/18/2022   ALBUMIN 3.6 10/18/2022   AST 20 10/18/2022   ALT 19 10/18/2022   ALKPHOS 132 (H) 10/18/2022   BILITOT 0.3 10/18/2022   GFRNONAA >60 10/18/2022   GFRAA >60 07/21/2020    Lab Results  Component Value Date   CEA1 68.8 (H) 02/23/2021   CEA <0.5 10/15/2008   VHQ469 9,071 (H) 09/20/2022    Medications: I have reviewed the patient's current  medications.   Assessment/Plan: Pancreas cancer -CT abdomen/pelvis without contrast 02/16/2021-multiple large hypoechoic masses in the patient's liver consistent metastatic disease, ill-defined masslike area in the pancreatic head measuring approximate 4.7 cm, possible filling defect in the right ventricle. -EGD 02/19/2021-large infiltrative mass with bleeding in the second portion of the duodenum, appears to be arising near the ampulla, partially obstructive with friable mucosa.  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 -Flexible sigmoidoscopy 02/19/2021-diverticulosis in the sigmoid colon, nonbleeding external and internal hemorrhoids -Cardiac CT 02/20/2021-mass in the mid RV attached to the interventricular septum measuring 16 mm x 6 mm and concerning for metastatic tumor -MRI abdomen with/without contrast 02/21/2021-mass in the posterior pancreatic head measuring 4.3 x 3.8 cm with obstruction of the central pancreatic duct and diffuse dilatation up to 6 mm consistent with pancreatic adenocarcinoma, liver lesions the largest mass of the central left lobe measuring 9.9 x 9.2 cm consistent with hepatic metastatic disease,  hepatomegaly. -Ultrasound-guided biopsy of a left liver lesion 02/24/2021-adenocarcinoma, morphologically similar to the duodenal biopsy; microsatellite stable, tumor mutation burden 1 -Negative genetic testing -Palliative radiation to the duodenal mass 02/25/2021-03/06/2021 -Cycle 1 FOLFOX 03/17/2021 -Cycle 2 FOLFOX 04/01/2021 -Cycle 3 FOLFIRINOX 04/14/2021 -Cycle 4 FOLFIRINOX 04/27/2021, Udenyca added -Cycle 5 FOLFIRINOX 05/12/2021, Udenyca -MRI abdomen 05/23/2021-decrease in size of pancreas mass and hepatic lesions, no progressive disease -Cycle 6 FOLFIRINOX 05/27/2021 -CT 06/17/2021-multiple liver metastases similar to her prior exam.  Pancreas  mass not significantly changed. -CT 06/26/2021-stable pancreas mass, no significant change in size and number of known  liver metastases. -CT abdomen/pelvis 07/12/2021-new gastrostomy tube.  Multiple liver masses slightly decreased in size from prior exam.  Pancreas head mass unchanged. -Cycle 1 gemcitabine/Abraxane 07/30/2021 -Cycle 2 gemcitabine/Abraxane 08/13/2021 -Cycle 3 gemcitabine 08/27/2021, Abraxane held due to significant bilateral toe pain, question neuropathy -Cycle 4 gemcitabine, Abraxane held due to neuropathy, 09/10/2021 -Cycle 5 gemcitabine, Abraxane held due to neuropathy, 09/24/2021 -Cycle 6 gemcitabine, Abraxane held due to neuropathy 10/16/2021 -CTs 10/29/2021-mild progression of multifocal liver metastases; mild increase in size of hypoenhancing mass within the head of the pancreas; new mild pleural nodularity along the posterior lower lobes -Cycle 7 gemcitabine/Abraxane 11/03/2021 -Cycle 8 gemcitabine/Abraxane 11/23/2021 -Cycle 9 gemcitabine/Abraxane 12/10/2021 -Cycle 10 Gemcitabine/Abraxane 12/24/2021 -CT abdomen/pelvis 01/04/2022-decrease size of pancreas head mass, no change in multifocal hepatic metastases -Cycle 11 gemcitabine/Abraxane 01/07/2022 -Cycle 12 gemcitabine/Abraxane 02/04/2022 -Cycle 13 Gemcitabine/Abraxane 02/18/2022 -Cycle 14 gemcitabine/Abraxane 03/11/2022 -Cycle 15 gemcitabine/Abraxane 03/29/2022 -Cycle 16 gemcitabine/Abraxane 04/16/2022 -CT abdomen/pelvis 04/22/2022-mixed response in the liver, some lesions larger, some smaller, no change in pancreas head mass, no evidence of metastatic disease elsewhere in the abdomen or pelvis -Cycle 17 gemcitabine/Abraxane 05/17/2022 -Cycle 18 Gemcitabine/Abraxane 06/01/2022 -Cycle 19 gemcitabine/Abraxane 06/18/2022 -Cycle 20 gemcitabine/Abraxane 07/05/2022 -Cycle 21 gemcitabine/Abraxane 08/09/2022 -Cycle 22 gemcitabine/Abraxane 08/23/2022 -CT abdomen/pelvis 09/01/2022-stable liver metastases, difficult to visualize the pancreas head mass, no evidence of disease progression -Cycle 23 gemcitabine/Abraxane 09/06/2022 -Cycle 24 gemcitabine/Abraxane  09/20/2022 2.  Iron deficiency anemia-improved -Feraheme 510 mg 02/20/2021 3.  Protein calorie malnutrition 4.  Possible right ventricular filling defect on noncontrast CT scan MRI cardiac morphology 02/21/2021-mass in the mid RV attached to the interventricular septum measures 16 mm x 6 mm.  Mass appears heterogeneous with area of enhancement on LGE imaging.  Mass is concerning for metastatic tumor.  Normal LV size and systolic function.  Normal RV size and systolic function. 5.  Hypertension 6.  History of CVA 7.  Hyperlipidemia 8.  Hypothyroidism 9.  Morbid obesity 10.  Asthma 11.  Migraines 12.  Pain secondary to #1 13.  Port-A-Cath placement 02/25/2021 14.  Hypokalemia 04/01/2021-started a potassium supplement 15.  Hospital admission 06/17/2021-intractable nausea and vomiting EGD 06/19/2021-diffuse gastritis, nonobstructing duodenal mass, biopsy of stomach-no malignancy or H. pylori, duodenal mass-adenocarcinoma 16.  Hospital admission 07/12/2021 with nausea/vomiting, abdominal pain, and hypokalemia 17.  Percutaneous gastrostomy tube placement 07/01/2021 18.  07/21/2022 bilateral lower extremity venous Doppler study-negative for DVT.      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  Her overall clinical status is unchanged, but she has increased pain over the past few months.  She will continue MS Contin.  We will increase the Roxanol and oxycodone doses for breakthrough pain.  She will continue gemcitabine/Abraxane on a 2-week schedule.  We will check the CA 19-9 when she returns in 2 weeks.  The plan is to refer her for restaging CTs if the CA 19-9 progressively rises or she continues to have increased pain.  Betsy Coder, MD  10/18/2022  12:29 PM

## 2022-10-18 NOTE — Progress Notes (Signed)
Patient seen by Dr. Sherrill today ? ?Vitals are within treatment parameters. ? ?Labs reviewed by Dr. Sherrill and are within treatment parameters. ? ?Per physician team, patient is ready for treatment and there are NO modifications to the treatment plan.  ?

## 2022-10-18 NOTE — Progress Notes (Signed)
Pt complaining of nausea and now actively vomiting.  Dr Benay Spice notified and order received.

## 2022-10-19 ENCOUNTER — Other Ambulatory Visit: Payer: Self-pay

## 2022-10-21 ENCOUNTER — Other Ambulatory Visit: Payer: Self-pay | Admitting: Oncology

## 2022-10-21 DIAGNOSIS — C250 Malignant neoplasm of head of pancreas: Secondary | ICD-10-CM

## 2022-10-21 NOTE — Telephone Encounter (Signed)
Script was already approved on 10/18/22 for #60 and #2 additional refills

## 2022-10-29 ENCOUNTER — Telehealth: Payer: Self-pay | Admitting: *Deleted

## 2022-10-29 ENCOUNTER — Other Ambulatory Visit: Payer: Self-pay | Admitting: Nurse Practitioner

## 2022-10-29 DIAGNOSIS — C259 Malignant neoplasm of pancreas, unspecified: Secondary | ICD-10-CM

## 2022-10-29 DIAGNOSIS — C250 Malignant neoplasm of head of pancreas: Secondary | ICD-10-CM

## 2022-10-29 MED ORDER — MORPHINE SULFATE (CONCENTRATE) 10 MG/0.5ML PO SOLN: 20.0000 mg | Freq: Three times a day (TID) | ORAL | 0 refills | Status: DC | PRN

## 2022-10-29 NOTE — Telephone Encounter (Signed)
Called to report [facility] is not filling her pain medications of oxycodone and morphine sulfate solution.  Called pharmacy and confirmed they did not receive the morphine solution MD sent on 11/6 and insurance is only allowing #5 day supply on the oxycodone (#60). Instructed pharmacy to please fill the #60 tablets today and morphine will be escribed. [facility] made aware of the status.

## 2022-11-01 ENCOUNTER — Encounter: Payer: Self-pay | Admitting: Nurse Practitioner

## 2022-11-01 ENCOUNTER — Inpatient Hospital Stay (HOSPITAL_BASED_OUTPATIENT_CLINIC_OR_DEPARTMENT_OTHER): Payer: Medicaid Other | Admitting: Nurse Practitioner

## 2022-11-01 ENCOUNTER — Inpatient Hospital Stay: Payer: Medicaid Other

## 2022-11-01 VITALS — BP 129/93 | HR 94 | Temp 98.2°F | Resp 16 | Wt 214.8 lb

## 2022-11-01 VITALS — BP 157/93 | HR 74

## 2022-11-01 DIAGNOSIS — C250 Malignant neoplasm of head of pancreas: Secondary | ICD-10-CM | POA: Diagnosis not present

## 2022-11-01 DIAGNOSIS — Z5111 Encounter for antineoplastic chemotherapy: Secondary | ICD-10-CM | POA: Diagnosis not present

## 2022-11-01 LAB — CBC WITH DIFFERENTIAL (CANCER CENTER ONLY)
Abs Immature Granulocytes: 0.02 10*3/uL (ref 0.00–0.07)
Basophils Absolute: 0 10*3/uL (ref 0.0–0.1)
Basophils Relative: 1 %
Eosinophils Absolute: 0.2 10*3/uL (ref 0.0–0.5)
Eosinophils Relative: 3 %
HCT: 29.2 % — ABNORMAL LOW (ref 36.0–46.0)
Hemoglobin: 9.1 g/dL — ABNORMAL LOW (ref 12.0–15.0)
Immature Granulocytes: 0 %
Lymphocytes Relative: 18 %
Lymphs Abs: 0.9 10*3/uL (ref 0.7–4.0)
MCH: 27.6 pg (ref 26.0–34.0)
MCHC: 31.2 g/dL (ref 30.0–36.0)
MCV: 88.5 fL (ref 80.0–100.0)
Monocytes Absolute: 0.4 10*3/uL (ref 0.1–1.0)
Monocytes Relative: 8 %
Neutro Abs: 3.6 10*3/uL (ref 1.7–7.7)
Neutrophils Relative %: 70 %
Platelet Count: 263 10*3/uL (ref 150–400)
RBC: 3.3 MIL/uL — ABNORMAL LOW (ref 3.87–5.11)
RDW: 17.4 % — ABNORMAL HIGH (ref 11.5–15.5)
WBC Count: 5.1 10*3/uL (ref 4.0–10.5)
nRBC: 0 % (ref 0.0–0.2)

## 2022-11-01 LAB — CMP (CANCER CENTER ONLY)
ALT: 10 U/L (ref 0–44)
AST: 19 U/L (ref 15–41)
Albumin: 3.7 g/dL (ref 3.5–5.0)
Alkaline Phosphatase: 157 U/L — ABNORMAL HIGH (ref 38–126)
Anion gap: 8 (ref 5–15)
BUN: 15 mg/dL (ref 6–20)
CO2: 29 mmol/L (ref 22–32)
Calcium: 9.5 mg/dL (ref 8.9–10.3)
Chloride: 101 mmol/L (ref 98–111)
Creatinine: 1.08 mg/dL — ABNORMAL HIGH (ref 0.44–1.00)
GFR, Estimated: >60 mL/min (ref >60)
Glucose, Bld: 100 mg/dL — ABNORMAL HIGH (ref 70–99)
Potassium: 4.2 mmol/L (ref 3.5–5.1)
Sodium: 138 mmol/L (ref 135–145)
Total Bilirubin: 0.4 mg/dL (ref 0.3–1.2)
Total Protein: 6.9 g/dL (ref 6.5–8.1)

## 2022-11-01 MED ORDER — ONDANSETRON 8 MG PO TBDP: 8.0000 mg | ORAL_TABLET | Freq: Three times a day (TID) | ORAL | 2 refills | Status: DC | PRN

## 2022-11-01 MED ORDER — PACLITAXEL PROTEIN-BOUND CHEMO INJECTION 100 MG
100.0000 mg/m2 | Freq: Once | INTRAVENOUS | Status: AC
  Administered 2022-11-01: 200 mg via INTRAVENOUS
  Filled 2022-11-01: qty 40

## 2022-11-01 MED ORDER — HEPARIN SOD (PORK) LOCK FLUSH 100 UNIT/ML IV SOLN
500.0000 [IU] | Freq: Once | INTRAVENOUS | Status: AC | PRN
  Administered 2022-11-01: 500 [IU]

## 2022-11-01 MED ORDER — SODIUM CHLORIDE 0.9 % IV SOLN
2000.0000 mg | Freq: Once | INTRAVENOUS | Status: AC
  Administered 2022-11-01: 2000 mg via INTRAVENOUS
  Filled 2022-11-01: qty 52.6

## 2022-11-01 MED ORDER — SODIUM CHLORIDE 0.9% FLUSH
10.0000 mL | INTRAVENOUS | Status: DC | PRN
  Administered 2022-11-01: 10 mL

## 2022-11-01 MED ORDER — PROCHLORPERAZINE MALEATE 10 MG PO TABS
10.0000 mg | ORAL_TABLET | Freq: Once | ORAL | Status: AC
  Administered 2022-11-01: 10 mg via ORAL
  Filled 2022-11-01: qty 1

## 2022-11-01 MED ORDER — SODIUM CHLORIDE 0.9 % IV SOLN: Freq: Once | INTRAVENOUS | Status: AC

## 2022-11-01 NOTE — Progress Notes (Signed)
[place] OFFICE PROGRESS NOTE   Diagnosis: Pancreas cancer  INTERVAL HISTORY:   Ms. Wendy Hoffman returns as scheduled.  She completed another cycle of gemcitabine/Abraxane 10/18/2022.  She has mild intermittent nausea unrelated to chemotherapy.  No mouth sores.  No diarrhea.  Stable numbness in the toes.  She notes improved pain control since the pain medication regimen was adjusted last visit.  Objective:  Vital signs in last 24 hours:  Blood pressure (!) 129/93, pulse 94, temperature 98.2 F (36.8 C), temperature source Temporal, resp. rate 16, weight 214 lb 12.8 oz (97.4 kg), SpO2 100 %.    HEENT: No thrush or ulcers. Resp: Lungs clear bilaterally. Cardio: Regular rate and rhythm. GI: Abdomen soft.  Tender at the right abdomen.  No hepatomegaly.  No mass. Vascular: No leg edema. Skin: Palms without erythema. Port-A-Cath without erythema.  Lab Results:  Lab Results  Component Value Date   WBC 5.1 11/01/2022   HGB 9.1 (L) 11/01/2022   HCT 29.2 (L) 11/01/2022   MCV 88.5 11/01/2022   PLT 263 11/01/2022   NEUTROABS 3.6 11/01/2022    Imaging:  No results found.  Medications: I have reviewed the patient's current medications.  Assessment/Plan: Pancreas cancer -CT abdomen/pelvis without contrast 02/16/2021-multiple large hypoechoic masses in the patient's liver consistent metastatic disease, ill-defined masslike area in the pancreatic head measuring approximate 4.7 cm, possible filling defect in the right ventricle. -EGD 02/19/2021-large infiltrative mass with bleeding in the second portion of the duodenum, appears to be arising near the ampulla, partially obstructive with friable mucosa.  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 -Flexible sigmoidoscopy 02/19/2021-diverticulosis in the sigmoid colon, nonbleeding external and internal hemorrhoids -Cardiac CT 02/20/2021-mass in the mid RV attached to the interventricular septum  measuring 16 mm x 6 mm and concerning for metastatic tumor -MRI abdomen with/without contrast 02/21/2021-mass in the posterior pancreatic head measuring 4.3 x 3.8 cm with obstruction of the central pancreatic duct and diffuse dilatation up to 6 mm consistent with pancreatic adenocarcinoma, liver lesions the largest mass of the central left lobe measuring 9.9 x 9.2 cm consistent with hepatic metastatic disease,  hepatomegaly. -Ultrasound-guided biopsy of a left liver lesion 02/24/2021-adenocarcinoma, morphologically similar to the duodenal biopsy; microsatellite stable, tumor mutation burden 1 -Negative genetic testing -Palliative radiation to the duodenal mass 02/25/2021-03/06/2021 -Cycle 1 FOLFOX 03/17/2021 -Cycle 2 FOLFOX 04/01/2021 -Cycle 3 FOLFIRINOX 04/14/2021 -Cycle 4 FOLFIRINOX 04/27/2021, Udenyca added -Cycle 5 FOLFIRINOX 05/12/2021, Udenyca -MRI abdomen 05/23/2021-decrease in size of pancreas mass and hepatic lesions, no progressive disease -Cycle 6 FOLFIRINOX 05/27/2021 -CT 06/17/2021-multiple liver metastases similar to her prior exam.  Pancreas mass not significantly changed. -CT 06/26/2021-stable pancreas mass, no significant change in size and number of known liver metastases. -CT abdomen/pelvis 07/12/2021-new gastrostomy tube.  Multiple liver masses slightly decreased in size from prior exam.  Pancreas head mass unchanged. -Cycle 1 gemcitabine/Abraxane 07/30/2021 -Cycle 2 gemcitabine/Abraxane 08/13/2021 -Cycle 3 gemcitabine 08/27/2021, Abraxane held due to significant bilateral toe pain, question neuropathy -Cycle 4 gemcitabine, Abraxane held due to neuropathy, 09/10/2021 -Cycle 5 gemcitabine, Abraxane held due to neuropathy, 09/24/2021 -Cycle 6 gemcitabine, Abraxane held due to neuropathy 10/16/2021 -CTs 10/29/2021-mild progression of multifocal liver metastases; mild increase in size of hypoenhancing mass within the head of the pancreas; new mild pleural nodularity along the posterior lower  lobes -Cycle 7 gemcitabine/Abraxane 11/03/2021 -Cycle 8 gemcitabine/Abraxane 11/23/2021 -Cycle 9 gemcitabine/Abraxane 12/10/2021 -Cycle 10 Gemcitabine/Abraxane 12/24/2021 -CT abdomen/pelvis 01/04/2022-decrease size of pancreas head mass, no change in multifocal hepatic metastases -Cycle 11  gemcitabine/Abraxane 01/07/2022 -Cycle 12 gemcitabine/Abraxane 02/04/2022 -Cycle 13 Gemcitabine/Abraxane 02/18/2022 -Cycle 14 gemcitabine/Abraxane 03/11/2022 -Cycle 15 gemcitabine/Abraxane 03/29/2022 -Cycle 16 gemcitabine/Abraxane 04/16/2022 -CT abdomen/pelvis 04/22/2022-mixed response in the liver, some lesions larger, some smaller, no change in pancreas head mass, no evidence of metastatic disease elsewhere in the abdomen or pelvis -Cycle 17 gemcitabine/Abraxane 05/17/2022 -Cycle 18 Gemcitabine/Abraxane 06/01/2022 -Cycle 19 gemcitabine/Abraxane 06/18/2022 -Cycle 20 gemcitabine/Abraxane 07/05/2022 -Cycle 21 gemcitabine/Abraxane 08/09/2022 -Cycle 22 gemcitabine/Abraxane 08/23/2022 -CT abdomen/pelvis 09/01/2022-stable liver metastases, difficult to visualize the pancreas head mass, no evidence of disease progression -Cycle 23 gemcitabine/Abraxane 09/06/2022 -Cycle 24 gemcitabine/Abraxane 09/20/2022 -Cycle 25 gemcitabine/Abraxane 10/18/2022 -Cycle 26 gemcitabine/Abraxane 11/01/2022 2.  Iron deficiency anemia-improved -Feraheme 510 mg 02/20/2021 3.  Protein calorie malnutrition 4.  Possible right ventricular filling defect on noncontrast CT scan MRI cardiac morphology 02/21/2021-mass in the mid RV attached to the interventricular septum measures 16 mm x 6 mm.  Mass appears heterogeneous with area of enhancement on LGE imaging.  Mass is concerning for metastatic tumor.  Normal LV size and systolic function.  Normal RV size and systolic function. 5.  Hypertension 6.  History of CVA 7.  Hyperlipidemia 8.  Hypothyroidism 9.  Morbid obesity 10.  Asthma 11.  Migraines 12.  Pain secondary to #1 13.  Port-A-Cath placement  02/25/2021 14.  Hypokalemia 04/01/2021-started a potassium supplement 15.  Hospital admission 06/17/2021-intractable nausea and vomiting EGD 06/19/2021-diffuse gastritis, nonobstructing duodenal mass, biopsy of stomach-no malignancy or H. pylori, duodenal mass-adenocarcinoma 16.  Hospital admission 07/12/2021 with nausea/vomiting, abdominal pain, and hypokalemia 17.  Percutaneous gastrostomy tube placement 07/01/2021 18.  07/21/2022 bilateral lower extremity venous Doppler study-negative for DVT.    Disposition: Ms. Hebdon appears stable.  She continues gemcitabine/Abraxane every 2 weeks.  She seems to be tolerating well.  There is no clinical evidence of disease progression.  We will follow-up on the CA 19-9 from today.  Plan to continue gemcitabine/Abraxane for now.  CBC and chemistry panel reviewed.  Labs adequate to proceed as above.  She will return for follow-up in 2 weeks.  We are available to see her sooner if needed.    [name]   11/01/2022  11:24 AM

## 2022-11-01 NOTE — Progress Notes (Signed)
Patient seen by Ned Card NP today  Vitals are not all within treatment parameters. B/P is 129/93.  Recheck before tx  Labs reviewed by Ned Card NP and are within treatment parameters.  Per physician team, patient is ready for treatment and there are NO modifications to the treatment plan.

## 2022-11-02 LAB — CANCER ANTIGEN 19-9: CA 19-9: 9392 U/mL — ABNORMAL HIGH (ref 0–35)

## 2022-11-11 ENCOUNTER — Other Ambulatory Visit: Payer: Self-pay

## 2022-11-14 ENCOUNTER — Other Ambulatory Visit: Payer: Self-pay | Admitting: Oncology

## 2022-11-15 ENCOUNTER — Inpatient Hospital Stay: Payer: Medicaid Other

## 2022-11-15 ENCOUNTER — Other Ambulatory Visit: Payer: Self-pay

## 2022-11-15 ENCOUNTER — Inpatient Hospital Stay: Payer: Medicaid Other | Admitting: Oncology

## 2022-11-18 ENCOUNTER — Encounter: Payer: Self-pay | Admitting: Family

## 2022-11-18 ENCOUNTER — Ambulatory Visit: Payer: Medicaid Other | Admitting: Family

## 2022-11-18 VITALS — BP 120/78 | HR 100 | Temp 98.1°F | Resp 18 | Ht 64.0 in | Wt 212.6 lb

## 2022-11-18 DIAGNOSIS — F4323 Adjustment disorder with mixed anxiety and depressed mood: Secondary | ICD-10-CM | POA: Diagnosis not present

## 2022-11-18 DIAGNOSIS — C250 Malignant neoplasm of head of pancreas: Secondary | ICD-10-CM

## 2022-11-18 DIAGNOSIS — R079 Chest pain, unspecified: Secondary | ICD-10-CM | POA: Diagnosis not present

## 2022-11-18 DIAGNOSIS — G8929 Other chronic pain: Secondary | ICD-10-CM

## 2022-11-18 DIAGNOSIS — M25562 Pain in left knee: Secondary | ICD-10-CM

## 2022-11-18 MED ORDER — DULOXETINE HCL 30 MG PO CPEP: ORAL_CAPSULE | ORAL | 0 refills | Status: DC

## 2022-11-18 MED ORDER — DULOXETINE HCL 30 MG PO CPEP: 30.0000 mg | ORAL_CAPSULE | Freq: Three times a day (TID) | ORAL | 1 refills | Status: DC

## 2022-11-18 NOTE — Progress Notes (Signed)
Wendy Hoffman is a 56 y.o. 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 per EGD 06/18/2021   Hypokalemia 06/18/2021   Malnutrition of moderate degree 06/18/2021   Cancer ([place]) 06/17/2021   Cervical ca ([place]) 06/17/2021   Nausea and vomiting 06/17/2021   Genetic testing 03/23/2021   Port-A-Cath in place 03/16/2021   Goals of care, counseling/discussion 03/06/2021   Morbidly obese ([place]) 02/27/2021   Heme positive stool    Iron deficiency anemia due to chronic blood loss    Epigastric pain    Malignant neoplasm of head of pancreas ([place]) 02/17/2021   Aortic atherosclerosis ([place]) 02/17/2021   GI bleeding 02/16/2021   Class 3 obesity with current BMI at 44.30 kg/m 02/16/2021   COPD (chronic obstructive pulmonary disease) ([place]) 02/16/2021   Symptomatic anemia    Pancreatic cancer ([place]) 02/2021   Snoring 05/03/2016   Morbid obesity ([place]) 04/25/2014   Asthma exacerbation 04/22/2014   Asthma attack 04/22/2014   Asthma with acute exacerbation 11/03/2013   Hypothyroidism 11/03/2013   Tobacco abuse 11/03/2013   Migraine 09/22/2012   Weakness of right side of body 09/22/2012   Thyroid disease    Hypertension    High cholesterol    H/O: hysterectomy    Asthma 11/26/2008   HEADACHE, CHRONIC 11/26/2008    Current Outpatient Medications  Medication Sig Dispense Refill   albuterol (VENTOLIN HFA) 108 (90 Base) MCG/ACT inhaler INHALE 1 TO 2 PUFFS BY MOUTH EVERY 6 HOURS AS NEEDED FOR WHEEZING AND FOR SHORTNESS OF BREATH 18 g 3   bisacodyl (DULCOLAX) 5 MG EC tablet Take 2 tablets (10 mg total) by mouth daily. 30 tablet 0   DULoxetine (CYMBALTA) 30 MG capsule Take 2 tablets in the am and 1 in the pm; 90 capsule 0   fluconazole (DIFLUCAN) 100 MG tablet  Take 1 tablet (100 mg total) by mouth daily. Take x 4 days for thush 4 tablet 3   gabapentin (NEURONTIN) 300 MG capsule TAKE ONE CAPSULE BY MOUTH NIGHTLY 30 capsule 2   lidocaine-prilocaine (EMLA) cream APPLY 1 APPLICATION TOPICALLY TO THE AFFECTED AREA AS NEEDED 30 g 0   morphine (MS CONTIN) 60 MG 12 hr tablet Take 1 tablet (60 mg total) by mouth every 12 (twelve) hours. 60 tablet 0   Morphine Sulfate (MORPHINE CONCENTRATE) 10 MG/0.5ML SOLN concentrated solution Place 1-1.5 mLs (20-30 mg total) under the tongue every 8 (eight) hours as needed for severe pain (pain not relieved with oxycodone). 118 mL 0   ondansetron (ZOFRAN) 8 MG tablet Take 1 tablet (8 mg total) by mouth every 8 (eight) hours as needed for nausea or vomiting. 30 tablet 2   ondansetron (ZOFRAN-ODT) 8 MG disintegrating tablet Take 1 tablet (8 mg total) by mouth every 8 (eight) hours as needed for nausea or vomiting. 20 tablet 2   Oxycodone HCl 10 MG TABS Take 1-2 tablets (10-20 mg total) by mouth every 4 (four) hours as needed. 100 tablet 0   pantoprazole (PROTONIX) 40 MG tablet TAKE 1 TABLET(40 MG) BY MOUTH TWICE DAILY 60 tablet 2   prochlorperazine (COMPAZINE) 10 MG tablet TAKE 1 TABLET(10 MG) BY MOUTH EVERY 6 HOURS AS NEEDED FOR NAUSEA OR VOMITING 60 tablet 2   scopolamine (TRANSDERM-SCOP) 1 MG/3DAYS Place 1 patch (1.5  mg total) onto the skin every 3 (three) days. 10 patch 3   SYMBICORT 160-4.5 MCG/ACT inhaler Inhale 2 puffs into the lungs 2 (two) times daily. 10.2 g 2   zolpidem (AMBIEN) 10 MG tablet Take 1 tablet (10 mg total) by mouth at bedtime as needed for sleep. 30 tablet 0   No current facility-administered medications for this visit.    Allergies: Blueberry [vaccinium angustifolium], Mango flavor, Oxaliplatin, Reglan [metoclopramide], Aspirin, and Penicillins  Past Medical History:  Diagnosis Date   Anemia    Anxiety    ARF (acute renal failure) ([place]) 06/18/2021   Arthritis    Asthma    Cervical ca ([facility])    H/O:  hysterectomy    High cholesterol    Hypertension    Hypothyroidism    Insomnia    Medical history non-contributory    Migraine    Morbid obesity ([place])    Pancreatic cancer ([place]) 02/2021   Shortness of breath    Stroke [facility])    Thyroid disease     Past Surgical History:  Procedure Laterality Date   ABDOMINAL HYSTERECTOMY     BIOPSY  02/19/2021   Procedure: BIOPSY;  Surgeon: Mauri Pole, MD;  Location: [facility];  Service: Endoscopy;;   BIOPSY  06/19/2021   Procedure: BIOPSY;  Surgeon: Otis Brace, MD;  Location: [facility];  Service: Gastroenterology;;   CESAREAN SECTION     3 occasions   CHOLECYSTECTOMY     ESOPHAGOGASTRODUODENOSCOPY (EGD) WITH PROPOFOL N/A 02/19/2021   Procedure: ESOPHAGOGASTRODUODENOSCOPY (EGD) WITH PROPOFOL;  Surgeon: Mauri Pole, MD;  [facility];  Service: Endoscopy;  Laterality: N/A;   ESOPHAGOGASTRODUODENOSCOPY (EGD) WITH PROPOFOL N/A 06/19/2021   Procedure: ESOPHAGOGASTRODUODENOSCOPY (EGD) WITH PROPOFOL;  Surgeon: Otis Brace, MD;  [facility];  Service: Gastroenterology;  Laterality: N/A;   FLEXIBLE SIGMOIDOSCOPY N/A 02/19/2021   Procedure: FLEXIBLE SIGMOIDOSCOPY;  Surgeon: Mauri Pole, MD;  [facility];  Service: Endoscopy;  Laterality: N/A;   IR GASTROSTOMY TUBE MOD SED  07/01/2021   IR GASTROSTOMY TUBE REMOVAL  08/21/2021   IR IMAGING GUIDED PORT INSERTION  02/24/2021   IR US GUIDE BX ASP/DRAIN  02/24/2021   THYROIDECTOMY, PARTIAL     Goiter    Family History  Problem Relation Age of Onset   Migraines Sister    Non-Hodgkin's lymphoma Other        maternal half brother's grandson   Non-Hodgkin's lymphoma Half-Brother        maternal half brother; dx 69s or 65s   Pancreatic cancer Other 81       MGF's niece    Social History   Tobacco Use   Smoking status: Former    Types: Cigarettes   Smokeless tobacco: Never  Substance Use Topics   Alcohol use: No    Subjective:    Presents today as a new patient to establish care; history of Stage 4 pancreatic cancer- at time of diagnosis, had also spread to small intestine, liver; diagnosed approximately 2 years ago; notes that last scan showed that tumor had not shrunk or grown in size;   Her oncologist requested that she establish with PCP; does admit to having depression/anxiety secondary to living with terminal diagnosis; takes Cymbalta 60 mg daily;  Chronic left knee pain- would like to see specialist to see if any treatment options available; using cane for stability;   She also notes that she has been having increased feeling of chest pain/ shortness of breath when walking-  symptoms present "for a while"- thought it was related to her chemotherapy; wants to do "everything she can to continue to fight for her health."    Objective:  Vitals:   11/18/22 1309  BP: 120/78  Pulse: 100  Resp: 18  Temp: 98.1 F (36.7 C)  TempSrc: Oral  SpO2: 98%  Weight: 212 lb 9.6 oz (96.4 kg)  Height: '5\' 4"'$  (1.626 m)    General: Well developed, well nourished, in no acute distress  Skin : Warm and dry.  Head: Normocephalic and atraumatic  Lungs: Respirations unlabored; clear to auscultation bilaterally without wheeze, rales, rhonchi  CVS exam: normal rate and regular rhythm.  Abdomen: Soft; nontender; nondistended; normoactive bowel sounds; no masses or hepatosplenomegaly  Musculoskeletal: No deformities; no active joint inflammation  Extremities: No edema, cyanosis, clubbing  Vessels: Symmetric bilaterally  Neurologic: Alert and oriented; speech intact; face symmetrical; moves all extremities well; CNII-XII intact without focal deficit  Assessment:  1. Adjustment reaction with anxiety and depression   2. Malignant neoplasm of head of pancreas ([place])   3. Chronic pain of left knee   4. Chest pain on exertion     Plan:  Increase Cymbalta to 60 mg in am and 30 mg in pm; will refer to therapist; Refer to sports  medicine ( asks for [place] location) to see if any treatment options available; Refer to cardiology;   Follow up as needed; continue with oncology for management of majority of healthcare needs and understands that PCP is here to help with acute issues as directed by oncology;   No follow-ups on file.  Orders Placed This Encounter  Procedures   Ambulatory referral to Psychology    Referral Priority:   Routine    Referral Type:   Psychiatric    Referral Reason:   Specialty Services Required    Requested Specialty:   Psychology    Number of Visits Requested:   1   Ambulatory referral to Sports Medicine    Referral Priority:   Routine    Referral Type:   Consultation    Number of Visits Requested:   1   Ambulatory referral to Cardiology    Referral Priority:   Routine    Referral Type:   Consultation    Referral Reason:   Specialty Services Required    Requested Specialty:   Cardiology    Number of Visits Requested:   1    Requested Prescriptions   Signed Prescriptions Disp Refills   DULoxetine (CYMBALTA) 30 MG capsule 90 capsule 0    Sig: Take 2 tablets in the am and 1 in the pm;

## 2022-11-24 ENCOUNTER — Ambulatory Visit (INDEPENDENT_AMBULATORY_CARE_PROVIDER_SITE_OTHER): Payer: Medicaid Other | Admitting: Family Medicine

## 2022-11-24 ENCOUNTER — Encounter: Payer: Self-pay | Admitting: Family Medicine

## 2022-11-24 VITALS — BP 138/88 | Ht 64.0 in | Wt 210.0 lb

## 2022-11-24 DIAGNOSIS — M1712 Unilateral primary osteoarthritis, left knee: Secondary | ICD-10-CM | POA: Insufficient documentation

## 2022-11-24 NOTE — Assessment & Plan Note (Signed)
Acute on chronic in nature.  Has significant deformity and degenerative changes on exam.  She is currently being treated for pancreatic cancer with chemotherapy.  She has tried steroid injections in the past with limited success. -Counseled on home exercise therapy and supportive care. -Pursue Zilretta injection. -Referral to orthopedic surgery.

## 2022-11-24 NOTE — Patient Instructions (Signed)
Nice to meet you Please use ice as needed  Please try the brace  We will run the benefits for the zilretta  We have made a referral to the surgeon  Please send me a message in [place] with any questions or updates.  We will call you with the benefits of the injection.   --Dr. Raeford Razor

## 2022-11-24 NOTE — Progress Notes (Signed)
  Wendy Hoffman - 56 y.o. female MRN [id number]  Date of birth: [birth year]  SUBJECTIVE:  Including CC & ROS.  No chief complaint on file.   Wendy Hoffman is a 56 y.o. female that is  presenting with acute on chronic left knee pain. Ongoing for several weeks. Having valgus changes in the knee. She is currently being treated for pancreatic cancer.    Review of Systems See HPI   HISTORY: Past Medical, Surgical, Social, and Family History Reviewed & Updated per EMR.   Pertinent Historical Findings include:  Past Medical History:  Diagnosis Date   Anemia    Anxiety    ARF (acute renal failure) ([place]) 06/18/2021   Arthritis    Asthma    Cervical ca ([facility])    H/O: hysterectomy    High cholesterol    Hypertension    Hypothyroidism    Insomnia    Medical history non-contributory    Migraine    Morbid obesity ([place])    Pancreatic cancer ([place]) 02/2021   Shortness of breath    Stroke [facility])    Thyroid disease     Past Surgical History:  Procedure Laterality Date   ABDOMINAL HYSTERECTOMY     BIOPSY  02/19/2021   Procedure: BIOPSY;  Surgeon: Mauri Pole, MD;  [facility];  Service: Endoscopy;;   BIOPSY  06/19/2021   Procedure: BIOPSY;  Surgeon: Otis Brace, MD;  [facility];  Service: Gastroenterology;;   CESAREAN SECTION     3 occasions   CHOLECYSTECTOMY     ESOPHAGOGASTRODUODENOSCOPY (EGD) WITH PROPOFOL N/A 02/19/2021   Procedure: ESOPHAGOGASTRODUODENOSCOPY (EGD) WITH PROPOFOL;  Surgeon: Mauri Pole, MD;  [facility];  Service: Endoscopy;  Laterality: N/A;   ESOPHAGOGASTRODUODENOSCOPY (EGD) WITH PROPOFOL N/A 06/19/2021   Procedure: ESOPHAGOGASTRODUODENOSCOPY (EGD) WITH PROPOFOL;  Surgeon: Otis Brace, MD;  [facility];  Service: Gastroenterology;  Laterality: N/A;   FLEXIBLE SIGMOIDOSCOPY N/A 02/19/2021   Procedure: FLEXIBLE SIGMOIDOSCOPY;  Surgeon: Mauri Pole, MD;  [facility];  Service:  Endoscopy;  Laterality: N/A;   IR GASTROSTOMY TUBE MOD SED  07/01/2021   IR GASTROSTOMY TUBE REMOVAL  08/21/2021   IR IMAGING GUIDED PORT INSERTION  02/24/2021   IR US GUIDE BX ASP/DRAIN  02/24/2021   THYROIDECTOMY, PARTIAL     Goiter     PHYSICAL EXAM:  VS: BP 138/88   Ht '5\' 4"'$  (1.626 m)   Wt 210 lb (95.3 kg)   BMI 36.05 kg/m  Physical Exam Gen: NAD, alert, cooperative with exam, well-appearing MSK:  Neurovascularly intact       ASSESSMENT & PLAN:   Primary osteoarthritis of left knee Acute on chronic in nature.  Has significant deformity and degenerative changes on exam.  She is currently being treated for pancreatic cancer with chemotherapy.  She has tried steroid injections in the past with limited success. -Counseled on home exercise therapy and supportive care. -Pursue Zilretta injection. -Referral to orthopedic surgery.

## 2022-11-25 ENCOUNTER — Telehealth: Payer: Self-pay

## 2022-11-25 NOTE — Telephone Encounter (Signed)
Ran patients benefits via [employer] 11/24/22, per [employer] they are unable to complete the request for benefit information. This Medicaid plan requires a caller directly from the providers office phone #: [phone].

## 2022-11-26 NOTE — Telephone Encounter (Signed)
Called [employer] at [phone] in regards to patients Zilretta benefits. Spoke with rep and she informed me that the CPT code 20610 didn't require authorization, but J3304 did require authorization. Faxed over documentation to start the prior authorization to [phone].  Rep did inform me that patient would just be responsible for a $4.00 copay.

## 2022-11-28 ENCOUNTER — Other Ambulatory Visit: Payer: Self-pay | Admitting: Oncology

## 2022-11-29 ENCOUNTER — Encounter: Payer: Self-pay | Admitting: Nurse Practitioner

## 2022-11-29 ENCOUNTER — Inpatient Hospital Stay: Payer: Medicaid Other

## 2022-11-29 ENCOUNTER — Inpatient Hospital Stay: Payer: Medicaid Other | Attending: Nurse Practitioner

## 2022-11-29 ENCOUNTER — Inpatient Hospital Stay (HOSPITAL_BASED_OUTPATIENT_CLINIC_OR_DEPARTMENT_OTHER): Payer: Medicaid Other | Admitting: Nurse Practitioner

## 2022-11-29 VITALS — BP 148/90 | HR 91

## 2022-11-29 VITALS — BP 116/77 | HR 100 | Temp 98.1°F | Resp 20 | Ht 64.0 in | Wt 209.6 lb

## 2022-11-29 DIAGNOSIS — I10 Essential (primary) hypertension: Secondary | ICD-10-CM | POA: Diagnosis not present

## 2022-11-29 DIAGNOSIS — Z5111 Encounter for antineoplastic chemotherapy: Secondary | ICD-10-CM | POA: Insufficient documentation

## 2022-11-29 DIAGNOSIS — E46 Unspecified protein-calorie malnutrition: Secondary | ICD-10-CM | POA: Diagnosis not present

## 2022-11-29 DIAGNOSIS — J45909 Unspecified asthma, uncomplicated: Secondary | ICD-10-CM | POA: Insufficient documentation

## 2022-11-29 DIAGNOSIS — Z8673 Personal history of transient ischemic attack (TIA), and cerebral infarction without residual deficits: Secondary | ICD-10-CM | POA: Diagnosis not present

## 2022-11-29 DIAGNOSIS — E785 Hyperlipidemia, unspecified: Secondary | ICD-10-CM | POA: Diagnosis not present

## 2022-11-29 DIAGNOSIS — C250 Malignant neoplasm of head of pancreas: Secondary | ICD-10-CM

## 2022-11-29 DIAGNOSIS — G43909 Migraine, unspecified, not intractable, without status migrainosus: Secondary | ICD-10-CM | POA: Diagnosis not present

## 2022-11-29 DIAGNOSIS — C259 Malignant neoplasm of pancreas, unspecified: Secondary | ICD-10-CM

## 2022-11-29 DIAGNOSIS — G629 Polyneuropathy, unspecified: Secondary | ICD-10-CM | POA: Diagnosis not present

## 2022-11-29 DIAGNOSIS — E039 Hypothyroidism, unspecified: Secondary | ICD-10-CM | POA: Diagnosis not present

## 2022-11-29 DIAGNOSIS — E876 Hypokalemia: Secondary | ICD-10-CM | POA: Insufficient documentation

## 2022-11-29 DIAGNOSIS — C787 Secondary malignant neoplasm of liver and intrahepatic bile duct: Secondary | ICD-10-CM | POA: Diagnosis not present

## 2022-11-29 LAB — CBC WITH DIFFERENTIAL (CANCER CENTER ONLY)
Abs Immature Granulocytes: 0.04 10*3/uL (ref 0.00–0.07)
Basophils Absolute: 0 10*3/uL (ref 0.0–0.1)
Basophils Relative: 0 %
Eosinophils Absolute: 0.1 10*3/uL (ref 0.0–0.5)
Eosinophils Relative: 1 %
HCT: 27 % — ABNORMAL LOW (ref 36.0–46.0)
Hemoglobin: 8.6 g/dL — ABNORMAL LOW (ref 12.0–15.0)
Immature Granulocytes: 1 %
Lymphocytes Relative: 7 %
Lymphs Abs: 0.6 10*3/uL — ABNORMAL LOW (ref 0.7–4.0)
MCH: 28 pg (ref 26.0–34.0)
MCHC: 31.9 g/dL (ref 30.0–36.0)
MCV: 87.9 fL (ref 80.0–100.0)
Monocytes Absolute: 1.1 10*3/uL — ABNORMAL HIGH (ref 0.1–1.0)
Monocytes Relative: 12 %
Neutro Abs: 7 10*3/uL (ref 1.7–7.7)
Neutrophils Relative %: 79 %
Platelet Count: 289 10*3/uL (ref 150–400)
RBC: 3.07 MIL/uL — ABNORMAL LOW (ref 3.87–5.11)
RDW: 17.9 % — ABNORMAL HIGH (ref 11.5–15.5)
WBC Count: 8.8 10*3/uL (ref 4.0–10.5)
nRBC: 0 % (ref 0.0–0.2)

## 2022-11-29 LAB — CMP (CANCER CENTER ONLY)
ALT: 14 U/L (ref 0–44)
AST: 31 U/L (ref 15–41)
Albumin: 3.3 g/dL — ABNORMAL LOW (ref 3.5–5.0)
Alkaline Phosphatase: 154 U/L — ABNORMAL HIGH (ref 38–126)
Anion gap: 8 (ref 5–15)
BUN: 16 mg/dL (ref 6–20)
CO2: 29 mmol/L (ref 22–32)
Calcium: 9.3 mg/dL (ref 8.9–10.3)
Chloride: 96 mmol/L — ABNORMAL LOW (ref 98–111)
Creatinine: 1.09 mg/dL — ABNORMAL HIGH (ref 0.44–1.00)
GFR, Estimated: >60 mL/min (ref >60)
Glucose, Bld: 112 mg/dL — ABNORMAL HIGH (ref 70–99)
Potassium: 4 mmol/L (ref 3.5–5.1)
Sodium: 133 mmol/L — ABNORMAL LOW (ref 135–145)
Total Bilirubin: 1.3 mg/dL — ABNORMAL HIGH (ref 0.3–1.2)
Total Protein: 7.1 g/dL (ref 6.5–8.1)

## 2022-11-29 MED ORDER — HEPARIN SOD (PORK) LOCK FLUSH 100 UNIT/ML IV SOLN
500.0000 [IU] | Freq: Once | INTRAVENOUS | Status: AC | PRN
  Administered 2022-11-29: 500 [IU]

## 2022-11-29 MED ORDER — SODIUM CHLORIDE 0.9 % IV SOLN
2000.0000 mg | Freq: Once | INTRAVENOUS | Status: AC
  Administered 2022-11-29: 2000 mg via INTRAVENOUS
  Filled 2022-11-29: qty 52.6

## 2022-11-29 MED ORDER — SODIUM CHLORIDE 0.9% FLUSH
10.0000 mL | INTRAVENOUS | Status: DC | PRN
  Administered 2022-11-29: 10 mL

## 2022-11-29 MED ORDER — ZOLPIDEM TARTRATE 10 MG PO TABS: 10.0000 mg | ORAL_TABLET | Freq: Every evening | ORAL | 0 refills | Status: DC | PRN

## 2022-11-29 MED ORDER — SODIUM CHLORIDE 0.9 % IV SOLN: Freq: Once | INTRAVENOUS | Status: AC

## 2022-11-29 MED ORDER — MORPHINE SULFATE ER 60 MG PO TBCR: 60.0000 mg | EXTENDED_RELEASE_TABLET | Freq: Two times a day (BID) | ORAL | 0 refills | Status: DC

## 2022-11-29 MED ORDER — PROCHLORPERAZINE MALEATE 10 MG PO TABS
10.0000 mg | ORAL_TABLET | Freq: Once | ORAL | Status: AC
  Administered 2022-11-29: 10 mg via ORAL
  Filled 2022-11-29: qty 1

## 2022-11-29 MED ORDER — PACLITAXEL PROTEIN-BOUND CHEMO INJECTION 100 MG
100.0000 mg/m2 | Freq: Once | INTRAVENOUS | Status: AC
  Administered 2022-11-29: 200 mg via INTRAVENOUS
  Filled 2022-11-29: qty 40

## 2022-11-29 NOTE — Progress Notes (Signed)
[facility] OFFICE PROGRESS NOTE   Diagnosis: Pancreas cancer  INTERVAL HISTORY:   Wendy Hoffman returns for follow-up.  She was last treated with gemcitabine/Abraxane 11/01/2022.  She did not return for follow-up on 11/15/2022.  She again had nausea after chemotherapy.  No mouth sores.  No diarrhea.  Abdominal pain varies, some days better and some days worse.  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  Objective:  Vital signs in last 24 hours:  Blood pressure 116/77, pulse 100, temperature 98.1 F (36.7 C), temperature source Oral, resp. rate 20, height _0  (1.626 m), weight 209 lb 9.6 oz (95.1 kg), SpO2 98 %.    HEENT: No thrush or ulcers. Resp: Lungs clear bilaterally. Cardio: Regular rate and rhythm. GI: Abdomen is soft, tender across the upper abdomen.  No hepatosplenomegaly.  No mass. Vascular: No leg edema. Port-A-Cath without erythema.  Lab Results:  Lab Results  Component Value Date   WBC 8.8 11/29/2022   HGB 8.6 (L) 11/29/2022   HCT 27.0 (L) 11/29/2022   MCV 87.9 11/29/2022   PLT 289 11/29/2022   NEUTROABS 7.0 11/29/2022    Imaging:  No results found.  Medications: I have reviewed the patient's current medications.  Assessment/Plan: Pancreas cancer -CT abdomen/pelvis without contrast 02/16/2021-multiple large hypoechoic masses in the patient's liver consistent metastatic disease, ill-defined masslike area in the pancreatic head measuring approximate 4.7 cm, possible filling defect in the right ventricle. -EGD 02/19/2021-large infiltrative mass with bleeding in the second portion of the duodenum, appears to be arising near the ampulla, partially obstructive with friable mucosa.  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 -Flexible sigmoidoscopy 02/19/2021-diverticulosis in the sigmoid colon, nonbleeding external and internal hemorrhoids -Cardiac CT 02/20/2021-mass in the mid RV attached to the interventricular septum  measuring 16 mm x 6 mm and concerning for metastatic tumor -MRI abdomen with/without contrast 02/21/2021-mass in the posterior pancreatic head measuring 4.3 x 3.8 cm with obstruction of the central pancreatic duct and diffuse dilatation up to 6 mm consistent with pancreatic adenocarcinoma, liver lesions the largest mass of the central left lobe measuring 9.9 x 9.2 cm consistent with hepatic metastatic disease,  hepatomegaly. -Ultrasound-guided biopsy of a left liver lesion 02/24/2021-adenocarcinoma, morphologically similar to the duodenal biopsy; microsatellite stable, tumor mutation burden 1 -Negative genetic testing -Palliative radiation to the duodenal mass 02/25/2021-03/06/2021 -Cycle 1 FOLFOX 03/17/2021 -Cycle 2 FOLFOX 04/01/2021 -Cycle 3 FOLFIRINOX 04/14/2021 -Cycle 4 FOLFIRINOX 04/27/2021, Udenyca added -Cycle 5 FOLFIRINOX 05/12/2021, Udenyca -MRI abdomen 05/23/2021-decrease in size of pancreas mass and hepatic lesions, no progressive disease -Cycle 6 FOLFIRINOX 05/27/2021 -CT 06/17/2021-multiple liver metastases similar to her prior exam.  Pancreas mass not significantly changed. -CT 06/26/2021-stable pancreas mass, no significant change in size and number of known liver metastases. -CT abdomen/pelvis 07/12/2021-new gastrostomy tube.  Multiple liver masses slightly decreased in size from prior exam.  Pancreas head mass unchanged. -Cycle 1 gemcitabine/Abraxane 07/30/2021 -Cycle 2 gemcitabine/Abraxane 08/13/2021 -Cycle 3 gemcitabine 08/27/2021, Abraxane held due to significant bilateral toe pain, question neuropathy -Cycle 4 gemcitabine, Abraxane held due to neuropathy, 09/10/2021 -Cycle 5 gemcitabine, Abraxane held due to neuropathy, 09/24/2021 -Cycle 6 gemcitabine, Abraxane held due to neuropathy 10/16/2021 -CTs 10/29/2021-mild progression of multifocal liver metastases; mild increase in size of hypoenhancing mass within the head of the pancreas; new mild pleural nodularity along the posterior lower  lobes -Cycle 7 gemcitabine/Abraxane 11/03/2021 -Cycle 8 gemcitabine/Abraxane 11/23/2021 -Cycle 9 gemcitabine/Abraxane 12/10/2021 -Cycle 10 Gemcitabine/Abraxane 12/24/2021 -CT abdomen/pelvis 01/04/2022-decrease size of pancreas head mass, no change in multifocal  hepatic metastases -Cycle 11 gemcitabine/Abraxane 01/07/2022 -Cycle 12 gemcitabine/Abraxane 02/04/2022 -Cycle 13 Gemcitabine/Abraxane 02/18/2022 -Cycle 14 gemcitabine/Abraxane 03/11/2022 -Cycle 15 gemcitabine/Abraxane 03/29/2022 -Cycle 16 gemcitabine/Abraxane 04/16/2022 -CT abdomen/pelvis 04/22/2022-mixed response in the liver, some lesions larger, some smaller, no change in pancreas head mass, no evidence of metastatic disease elsewhere in the abdomen or pelvis -Cycle 17 gemcitabine/Abraxane 05/17/2022 -Cycle 18 Gemcitabine/Abraxane 06/01/2022 -Cycle 19 gemcitabine/Abraxane 06/18/2022 -Cycle 20 gemcitabine/Abraxane 07/05/2022 -Cycle 21 gemcitabine/Abraxane 08/09/2022 -Cycle 22 gemcitabine/Abraxane 08/23/2022 -CT abdomen/pelvis 09/01/2022-stable liver metastases, difficult to visualize the pancreas head mass, no evidence of disease progression -Cycle 23 gemcitabine/Abraxane 09/06/2022 -Cycle 24 gemcitabine/Abraxane 09/20/2022 -Cycle 25 gemcitabine/Abraxane 10/18/2022 -Cycle 26 gemcitabine/Abraxane 11/01/2022 -Cycle 27 gemcitabine/Abraxane 11/29/2022 2.  Iron deficiency anemia-improved -Feraheme 510 mg 02/20/2021 3.  Protein calorie malnutrition 4.  Possible right ventricular filling defect on noncontrast CT scan MRI cardiac morphology 02/21/2021-mass in the mid RV attached to the interventricular septum measures 16 mm x 6 mm.  Mass appears heterogeneous with area of enhancement on LGE imaging.  Mass is concerning for metastatic tumor.  Normal LV size and systolic function.  Normal RV size and systolic function. 5.  Hypertension 6.  History of CVA 7.  Hyperlipidemia 8.  Hypothyroidism 9.  Morbid obesity 10.  Asthma 11.  Migraines 12.  Pain  secondary to #1 13.  Port-A-Cath placement 02/25/2021 14.  Hypokalemia 04/01/2021-started a potassium supplement 15.  Hospital admission 06/17/2021-intractable nausea and vomiting EGD 06/19/2021-diffuse gastritis, nonobstructing duodenal mass, biopsy of stomach-no malignancy or H. pylori, duodenal mass-adenocarcinoma 16.  Hospital admission 07/12/2021 with nausea/vomiting, abdominal pain, and hypokalemia 17.  Percutaneous gastrostomy tube placement 07/01/2021 18.  07/21/2022 bilateral lower extremity venous Doppler study-negative for DVT.    Disposition: Ms. Hollenback appears unchanged.  She is on active treatment with Gemcitabine/Abraxane every 2 weeks.  Plan to continue the same.  Next restaging CTs in January.  CBC reviewed.  Counts adequate to proceed as above.  She will return for lab, follow-up, chemotherapy in 2 weeks.    [name]   11/29/2022  12:13 PM

## 2022-11-29 NOTE — Progress Notes (Signed)
Patient seen by Lisa Thomas NP today  Vitals are within treatment parameters.  Labs reviewed by Lisa Thomas NP and are within treatment parameters.  Per physician team, patient is ready for treatment and there are NO modifications to the treatment plan.     

## 2022-11-29 NOTE — Patient Instructions (Signed)
[facility]   Discharge Instructions: Thank you for choosing [facility] to provide your oncology and hematology care.   If you have a lab appointment with the [facility], please go directly to the [facility] and check in at the registration area.   Wear comfortable clothing and clothing appropriate for easy access to any Portacath or PICC line.   We strive to give you quality time with your provider. You may need to reschedule your appointment if you arrive late (15 or more minutes).  Arriving late affects you and other patients whose appointments are after yours.  Also, if you miss three or more appointments without notifying the office, you may be dismissed from the clinic at the provider's discretion.      For prescription refill requests, have your pharmacy contact our office and allow 72 hours for refills to be completed.    Today you received the following chemotherapy and/or immunotherapy agents Abraxane, Gemzar      To help prevent nausea and vomiting after your treatment, we encourage you to take your nausea medication as directed.  BELOW ARE SYMPTOMS THAT SHOULD BE REPORTED IMMEDIATELY: *FEVER GREATER THAN 100.4 F (38 C) OR HIGHER *CHILLS OR SWEATING *NAUSEA AND VOMITING THAT IS NOT CONTROLLED WITH YOUR NAUSEA MEDICATION *UNUSUAL SHORTNESS OF BREATH *UNUSUAL BRUISING OR BLEEDING *URINARY PROBLEMS (pain or burning when urinating, or frequent urination) *BOWEL PROBLEMS (unusual diarrhea, constipation, pain near the anus) TENDERNESS IN MOUTH AND THROAT WITH OR WITHOUT PRESENCE OF ULCERS (sore throat, sores in mouth, or a toothache) UNUSUAL RASH, SWELLING OR PAIN  UNUSUAL VAGINAL DISCHARGE OR ITCHING   Items with * indicate a potential emergency and should be followed up as soon as possible or go to the Emergency Department if any problems should occur.  Please show the CHEMOTHERAPY ALERT CARD or IMMUNOTHERAPY ALERT CARD at  check-in to the Emergency Department and triage nurse.  Should you have questions after your visit or need to cancel or reschedule your appointment, please contact [facility]  Dept: [phone]  and follow the prompts.  Office hours are 8:00 a.m. to 4:30 p.m. Monday - Friday. Please note that voicemails left after 4:00 p.m. may not be returned until the following business day.  We are closed weekends and major holidays. You have access to a nurse at all times for urgent questions. Please call the main number to the clinic Dept: [phone] and follow the prompts.   For any non-urgent questions, you may also contact your provider using MyChart. We now offer e-Visits for anyone 54 and older to request care online for non-urgent symptoms. For details visit [email or web address].   Also download the MyChart app! Go to the app store, search "MyChart", open the app, select [facility], and log in with your MyChart username and password.  Masks are optional in the cancer centers. If you would like for your care team to wear a mask while they are taking care of you, please let them know. You may have one support person who is at least 54 years old accompany you for your appointments.  Paclitaxel Nanoparticle Albumin-Bound Injection What is this medication? NANOPARTICLE ALBUMIN-BOUND PACLITAXEL (Na no PAHR ti kuhl al BYOO muhn-bound PAK li TAX el) treats some types of cancer. It works by slowing down the growth of cancer cells. This medicine may be used for other purposes; ask your health care provider or pharmacist if you have questions. COMMON BRAND  NAME(S): Abraxane What should I tell my care team before I take this medication? They need to know if you have any of these conditions: Liver disease Low white blood cell levels An unusual or allergic reaction to paclitaxel, albumin, other medications, foods, dyes, or preservatives If you or your partner are pregnant or  trying to get pregnant Breast-feeding How should I use this medication? This medication is injected into a vein. It is given by your care team in a hospital or clinic setting. Talk to your care team about the use of this medication in children. Special care may be needed. Overdosage: If you think you have taken too much of this medicine contact a poison control center or emergency room at once. NOTE: This medicine is only for you. Do not share this medicine with others. What if I miss a dose? Keep appointments for follow-up doses. It is important not to miss your dose. Call your care team if you are unable to keep an appointment. What may interact with this medication? Other medications may affect the way this medication works. Talk with your care team about all of the medications you take. They may suggest changes to your treatment plan to lower the risk of side effects and to make sure your medications work as intended. This list may not describe all possible interactions. Give your health care provider a list of all the medicines, herbs, non-prescription drugs, or dietary supplements you use. Also tell them if you smoke, drink alcohol, or use illegal drugs. Some items may interact with your medicine. What should I watch for while using this medication? Your condition will be monitored carefully while you are receiving this medication. You may need blood work while taking this medication. This medication may make you feel generally unwell. This is not uncommon as chemotherapy can affect healthy cells as well as cancer cells. Report any side effects. Continue your course of treatment even though you feel ill unless your care team tells you to stop. This medication can cause serious allergic reactions. To reduce the risk, your care team may give you other medications to take before receiving this one. Be sure to follow the directions from your care team. This medication may increase your risk of  getting an infection. Call your care team for advice if you get a fever, chills, sore throat, or other symptoms of a cold or flu. Do not treat yourself. Try to avoid being around people who are sick. This medication may increase your risk to bruise or bleed. Call your care team if you notice any unusual bleeding. Be careful brushing or flossing your teeth or using a toothpick because you may get an infection or bleed more easily. If you have any dental work done, tell your dentist you are receiving this medication. Talk to your care team if you or your partner may be pregnant. Serious birth defects can occur if you take this medication during pregnancy and for 6 months after the last dose. You will need a negative pregnancy test before starting this medication. Contraception is recommended while taking this medication and for 6 months after the last dose. Your care team can help you find the option that works for you. If your partner can get pregnant, use a condom during sex while taking this medication and for 3 months after the last dose. Do not breastfeed while taking this medication and for 2 weeks after the last dose. This medication may cause infertility. Talk to your care team   if you are concerned about your fertility. What side effects may I notice from receiving this medication? Side effects that you should report to your care team as soon as possible: Allergic reactions--skin rash, itching, hives, swelling of the face, lips, tongue, or throat Dry cough, shortness of breath or trouble breathing Infection--fever, chills, cough, sore throat, wounds that don't heal, pain or trouble when passing urine, general feeling of discomfort or being unwell Low red blood cell level--unusual weakness or fatigue, dizziness, headache, trouble breathing Pain, tingling, or numbness in the hands or feet Stomach pain, unusual weakness or fatigue, nausea, vomiting, diarrhea, or fever that lasts longer than  expected Unusual bruising or bleeding Side effects that usually do not require medical attention (report to your care team if they continue or are bothersome): Diarrhea Fatigue Hair loss Loss of appetite Nausea Vomiting This list may not describe all possible side effects. Call your doctor for medical advice about side effects. You may report side effects to FDA at 1-800-FDA-1088. Where should I keep my medication? This medication is given in a hospital or clinic. It will not be stored at home. NOTE: This sheet is a summary. It may not cover all possible information. If you have questions about this medicine, talk to your doctor, pharmacist, or health care provider.  2023 Elsevier/Gold Standard (2008-01-20 00:00:00)  Gemcitabine Injection What is this medication? GEMCITABINE (jem SYE ta been) treats some types of cancer. It works by slowing down the growth of cancer cells. This medicine may be used for other purposes; ask your health care provider or pharmacist if you have questions. COMMON BRAND NAME(S): Gemzar, Infugem What should I tell my care team before I take this medication? They need to know if you have any of these conditions: Blood disorders Infection Kidney disease Liver disease Lung or breathing disease, such as asthma or COPD Recent or ongoing radiation therapy An unusual or allergic reaction to gemcitabine, other medications, foods, dyes, or preservatives If you or your partner are pregnant or trying to get pregnant Breast-feeding How should I use this medication? This medication is injected into a vein. It is given by your care team in a hospital or clinic setting. Talk to your care team about the use of this medication in children. Special care may be needed. Overdosage: If you think you have taken too much of this medicine contact a poison control center or emergency room at once. NOTE: This medicine is only for you. Do not share this medicine with others. What  if I miss a dose? Keep appointments for follow-up doses. It is important not to miss your dose. Call your care team if you are unable to keep an appointment. What may interact with this medication? Interactions have not been studied. This list may not describe all possible interactions. Give your health care provider a list of all the medicines, herbs, non-prescription drugs, or dietary supplements you use. Also tell them if you smoke, drink alcohol, or use illegal drugs. Some items may interact with your medicine. What should I watch for while using this medication? Your condition will be monitored carefully while you are receiving this medication. This medication may make you feel generally unwell. This is not uncommon, as chemotherapy can affect healthy cells as well as cancer cells. Report any side effects. Continue your course of treatment even though you feel ill unless your care team tells you to stop. In some cases, you may be given additional medications to help with side effects. Follow  all directions for their use. This medication may increase your risk of getting an infection. Call your care team for advice if you get a fever, chills, sore throat, or other symptoms of a cold or flu. Do not treat yourself. Try to avoid being around people who are sick. This medication may increase your risk to bruise or bleed. Call your care team if you notice any unusual bleeding. Be careful brushing or flossing your teeth or using a toothpick because you may get an infection or bleed more easily. If you have any dental work done, tell your dentist you are receiving this medication. Avoid taking medications that contain aspirin, acetaminophen, ibuprofen, naproxen, or ketoprofen unless instructed by your care team. These medications may hide a fever. Talk to your care team if you or your partner wish to become pregnant or think you might be pregnant. This medication can cause serious birth defects if taken  during pregnancy and for 6 months after the last dose. A negative pregnancy test is required before starting this medication. A reliable form of contraception is recommended while taking this medication and for 6 months after the last dose. Talk to your care team about effective forms of contraception. Do not father a child while taking this medication and for 3 months after the last dose. Use a condom while having sex during this time period. Do not breastfeed while taking this medication and for at least 1 week after the last dose. This medication may cause infertility. Talk to your care team if you are concerned about your fertility. What side effects may I notice from receiving this medication? Side effects that you should report to your care team as soon as possible: Allergic reactions--skin rash, itching, hives, swelling of the face, lips, tongue, or throat Capillary leak syndrome--stomach or muscle pain, unusual weakness or fatigue, feeling faint or lightheaded, decrease in the amount of urine, swelling of the ankles, hands, or feet, trouble breathing Infection--fever, chills, cough, sore throat, wounds that don't heal, pain or trouble when passing urine, general feeling of discomfort or being unwell Liver injury--right upper belly pain, loss of appetite, nausea, light-colored stool, dark yellow or brown urine, yellowing skin or eyes, unusual weakness or fatigue Low red blood cell level--unusual weakness or fatigue, dizziness, headache, trouble breathing Lung injury--shortness of breath or trouble breathing, cough, spitting up blood, chest pain, fever Stomach pain, bloody diarrhea, pale skin, unusual weakness or fatigue, decrease in the amount of urine, which may be signs of hemolytic uremic syndrome Sudden and severe headache, confusion, change in vision, seizures, which may be signs of posterior reversible encephalopathy syndrome (PRES) Unusual bruising or bleeding Side effects that usually do  not require medical attention (report to your care team if they continue or are bothersome): Diarrhea Drowsiness Hair loss Nausea Pain, redness, or swelling with sores inside the mouth or throat Vomiting This list may not describe all possible side effects. Call your doctor for medical advice about side effects. You may report side effects to FDA at 1-800-FDA-1088. Where should I keep my medication? This medication is given in a hospital or clinic. It will not be stored at home. NOTE: This sheet is a summary. It may not cover all possible information. If you have questions about this medicine, talk to your doctor, pharmacist, or health care provider.  2023 Elsevier/Gold Standard (2008-01-20 00:00:00)

## 2022-11-30 ENCOUNTER — Other Ambulatory Visit: Payer: Self-pay | Admitting: Oncology

## 2022-11-30 DIAGNOSIS — C259 Malignant neoplasm of pancreas, unspecified: Secondary | ICD-10-CM

## 2022-11-30 LAB — CANCER ANTIGEN 19-9: CA 19-9: 8616 U/mL — ABNORMAL HIGH (ref 0–35)

## 2022-12-01 ENCOUNTER — Telehealth: Payer: Self-pay | Admitting: *Deleted

## 2022-12-01 NOTE — Telephone Encounter (Addendum)
Reports pharmacy did not refill her MS Contin, Zolpidem or gabapentin and requests RN to follow up. Called pharmacy and they ran through the [facility] and it went through. Provided gabapentin refill information and was informed that zolpidem needs prior authorization. Started PA procedure on CoverMyMeds and faxed in. @ 1545: received confirmation that zolpidem is authorized. Notified patient and pharmacy.

## 2022-12-02 ENCOUNTER — Ambulatory Visit: Payer: Medicaid Other | Attending: Internal Medicine | Admitting: Internal Medicine

## 2022-12-02 NOTE — Telephone Encounter (Signed)
Contacted [employer] after receiving form that was faxed, according to form and rep prior authorization is not required for Codes J3304 and 20610. Authorization number is [id number]

## 2022-12-02 NOTE — Telephone Encounter (Signed)
Called patient to inform of approval for left knee Zilretta, and informed of $4.00 copay. Patient is scheduled.

## 2022-12-02 NOTE — Progress Notes (Deleted)
Cardiology Office Note:    Date:  12/02/2022   ID:  Nefertiti Mohamad, DOB [birth year], MRN [id number]  PCP:  Marrian Salvage, [place] Providers Cardiologist:  None { Click to update primary MD,subspecialty MD or APP then REFRESH:1}    Referring MD: Marrian Salvage,*   No chief complaint on file. ***  History of Present Illness:    Deon Ivey is a 56 y.o. 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 , elevated T2 peripherally/low T1  centrally c/f metastatic tumor RV  Past Medical History:  Diagnosis Date   Anemia    Anxiety    ARF (acute renal failure) (HCC) 06/18/2021   Arthritis    Asthma    Cervical ca (HCC)    H/O: hysterectomy    High cholesterol    Hypertension    Hypothyroidism    Insomnia    Medical history non-contributory    Migraine    Morbid obesity ([place])    Pancreatic cancer ([place]) 02/2021   Shortness of breath    Stroke [facility])    Thyroid disease     Past Surgical History:  Procedure Laterality Date   ABDOMINAL HYSTERECTOMY     BIOPSY  02/19/2021   Procedure: BIOPSY;  Surgeon: Mauri Pole, MD;  Location: [facility];  Service: Endoscopy;;   BIOPSY  06/19/2021   Procedure: BIOPSY;  Surgeon: Otis Brace, MD;  Location: [facility];  Service: Gastroenterology;;   CESAREAN SECTION     3 occasions   CHOLECYSTECTOMY     ESOPHAGOGASTRODUODENOSCOPY (EGD) WITH PROPOFOL N/A 02/19/2021   Procedure: ESOPHAGOGASTRODUODENOSCOPY (EGD) WITH PROPOFOL;  Surgeon: Mauri Pole, MD;  Location: [facility];  Service: Endoscopy;  Laterality: N/A;   ESOPHAGOGASTRODUODENOSCOPY (EGD) WITH PROPOFOL N/A 06/19/2021   Procedure: ESOPHAGOGASTRODUODENOSCOPY (EGD) WITH PROPOFOL;  Surgeon: Otis Brace, MD;  Location: [facility];  Service: Gastroenterology;  Laterality: N/A;   FLEXIBLE SIGMOIDOSCOPY N/A 02/19/2021   Procedure: FLEXIBLE SIGMOIDOSCOPY;  Surgeon: Mauri Pole, MD;  Location:  [facility];  Service: Endoscopy;  Laterality: N/A;   IR GASTROSTOMY TUBE MOD SED  07/01/2021   IR GASTROSTOMY TUBE REMOVAL  08/21/2021   IR IMAGING GUIDED PORT INSERTION  02/24/2021   IR US GUIDE BX ASP/DRAIN  02/24/2021   THYROIDECTOMY, PARTIAL     Goiter    Current Medications: No outpatient medications have been marked as taking for the 12/02/22 encounter (Appointment) with Janina Mayo, MD.     Allergies:   Blueberry [vaccinium angustifolium], Mango flavor, Oxaliplatin, Reglan [metoclopramide], Aspirin, and Penicillins   Social History   Socioeconomic History   Marital status: Single    Spouse name: Not on file   Number of children: Not on file   Years of education: Not on file   Highest education level: Not on file  Occupational History   Not on file  Tobacco Use   Smoking status: Former    Types: Cigarettes   Smokeless tobacco: Never  Vaping Use   Vaping Use: Never used  Substance and Sexual Activity   Alcohol use: No   Drug use: No   Sexual activity: Not on file  Other Topics Concern   Not on file  Social History Narrative   Not on file   Social Determinants of Health   Financial Resource Strain: Not on file  Food Insecurity: Not on file  Transportation Needs: Not on file  Physical Activity: Not on file  Stress: Not on file  Social Connections: Not on file     Family History: The patient's ***family history includes Migraines in her sister; Non-Hodgkin's lymphoma in her half-brother and another family member; Pancreatic cancer (age of onset: 55) in an other family member.  ROS:   Please see the history of present illness.    *** All other systems reviewed and are negative.  EKGs/Labs/Other Studies Reviewed:    The following studies were reviewed today: ***  EKG:  EKG is *** ordered today.  The ekg ordered today demonstrates ***  Recent Labs: 11/29/2022: ALT 14; BUN 16; Creatinine 1.09; Hemoglobin 8.6; Platelet Count 289; Potassium 4.0; Sodium 133   Recent Lipid Panel    Component Value Date/Time   CHOL  10/15/2008 0535    137        ATP III CLASSIFICATION:  <200     mg/dL   Desirable  200-239  mg/dL   Borderline High  >=240    mg/dL   High   TRIG 155 (H) 10/15/2008 0535   HDL 35 (L) 10/15/2008 0535   CHOLHDL 3.9 10/15/2008 0535   VLDL 31 10/15/2008 0535   LDLCALC  10/15/2008 0535    71        Total Cholesterol/HDL:CHD Risk Coronary Heart Disease Risk Table                     Men   Women  1/2 Average Risk   3.4   3.3     Risk Assessment/Calculations:   {Does this patient have ATRIAL FIBRILLATION?[phone]}  No BP recorded.  {Refresh Note OR Click here to enter BP  :1}***         Physical Exam:    VS:  There were no vitals taken for this visit.    Wt Readings from Last 3 Encounters:  11/29/22 209 lb 9.6 oz (95.1 kg)  11/24/22 210 lb (95.3 kg)  11/18/22 212 lb 9.6 oz (96.4 kg)     GEN: *** Well nourished, well developed in no acute distress HEENT: Normal NECK: No JVD; No carotid bruits LYMPHATICS: No lymphadenopathy CARDIAC: ***RRR, no murmurs, rubs, gallops RESPIRATORY:  Clear to auscultation without rales, wheezing or rhonchi  ABDOMEN: Soft, non-tender, non-distended MUSCULOSKELETAL:  No edema; No deformity  SKIN: Warm and dry NEUROLOGIC:  Alert and oriented x 3 PSYCHIATRIC:  Normal affect   ASSESSMENT:    No diagnosis found. PLAN:    In order of problems listed above:  ***      {Are you ordering a CV Procedure (e.g. stress test, cath, DCCV, TEE, etc)?   Press F2        :[id number]}    Medication Adjustments/Labs and Tests Ordered: Current medicines are reviewed at length with the patient today.  Concerns regarding medicines are outlined above.  No orders of the defined types were placed in this encounter.  No orders of the defined types were placed in this encounter.   There are no Patient Instructions on file for this visit.   Signed, Janina Mayo, MD  12/02/2022 2:30 PM     [place]

## 2022-12-09 ENCOUNTER — Ambulatory Visit: Payer: Medicaid Other | Admitting: Family Medicine

## 2022-12-11 ENCOUNTER — Other Ambulatory Visit: Payer: Self-pay

## 2022-12-14 ENCOUNTER — Other Ambulatory Visit: Payer: Self-pay | Admitting: *Deleted

## 2022-12-14 ENCOUNTER — Inpatient Hospital Stay: Payer: Medicaid Other | Admitting: Licensed Clinical Social Worker

## 2022-12-14 ENCOUNTER — Inpatient Hospital Stay: Payer: Medicaid Other

## 2022-12-14 ENCOUNTER — Ambulatory Visit (HOSPITAL_BASED_OUTPATIENT_CLINIC_OR_DEPARTMENT_OTHER)
Admission: RE | Admit: 2022-12-14 | Discharge: 2022-12-14 | Disposition: A | Discharge status: Home / Self Care | Payer: Medicaid Other | Source: Ambulatory Visit | Attending: Nurse Practitioner | Admitting: Nurse Practitioner

## 2022-12-14 ENCOUNTER — Inpatient Hospital Stay: Payer: Medicaid Other | Attending: Nurse Practitioner

## 2022-12-14 ENCOUNTER — Inpatient Hospital Stay (HOSPITAL_BASED_OUTPATIENT_CLINIC_OR_DEPARTMENT_OTHER): Payer: Medicaid Other | Admitting: Oncology

## 2022-12-14 VITALS — BP 115/76 | HR 100 | Temp 98.1°F | Resp 20 | Ht 64.0 in | Wt 205.5 lb

## 2022-12-14 VITALS — BP 130/82 | HR 96 | Temp 98.5°F

## 2022-12-14 DIAGNOSIS — D649 Anemia, unspecified: Secondary | ICD-10-CM

## 2022-12-14 DIAGNOSIS — G629 Polyneuropathy, unspecified: Secondary | ICD-10-CM | POA: Insufficient documentation

## 2022-12-14 DIAGNOSIS — C250 Malignant neoplasm of head of pancreas: Secondary | ICD-10-CM

## 2022-12-14 DIAGNOSIS — I10 Essential (primary) hypertension: Secondary | ICD-10-CM | POA: Insufficient documentation

## 2022-12-14 DIAGNOSIS — C787 Secondary malignant neoplasm of liver and intrahepatic bile duct: Secondary | ICD-10-CM | POA: Insufficient documentation

## 2022-12-14 DIAGNOSIS — Z95828 Presence of other vascular implants and grafts: Secondary | ICD-10-CM

## 2022-12-14 DIAGNOSIS — E46 Unspecified protein-calorie malnutrition: Secondary | ICD-10-CM | POA: Insufficient documentation

## 2022-12-14 DIAGNOSIS — E876 Hypokalemia: Secondary | ICD-10-CM | POA: Insufficient documentation

## 2022-12-14 DIAGNOSIS — Z8673 Personal history of transient ischemic attack (TIA), and cerebral infarction without residual deficits: Secondary | ICD-10-CM | POA: Insufficient documentation

## 2022-12-14 DIAGNOSIS — D509 Iron deficiency anemia, unspecified: Secondary | ICD-10-CM | POA: Insufficient documentation

## 2022-12-14 DIAGNOSIS — E039 Hypothyroidism, unspecified: Secondary | ICD-10-CM | POA: Insufficient documentation

## 2022-12-14 DIAGNOSIS — R110 Nausea: Secondary | ICD-10-CM

## 2022-12-14 DIAGNOSIS — Z5111 Encounter for antineoplastic chemotherapy: Secondary | ICD-10-CM | POA: Insufficient documentation

## 2022-12-14 DIAGNOSIS — G43909 Migraine, unspecified, not intractable, without status migrainosus: Secondary | ICD-10-CM | POA: Insufficient documentation

## 2022-12-14 DIAGNOSIS — J45909 Unspecified asthma, uncomplicated: Secondary | ICD-10-CM | POA: Insufficient documentation

## 2022-12-14 DIAGNOSIS — E785 Hyperlipidemia, unspecified: Secondary | ICD-10-CM | POA: Insufficient documentation

## 2022-12-14 DIAGNOSIS — Z5189 Encounter for other specified aftercare: Secondary | ICD-10-CM | POA: Insufficient documentation

## 2022-12-14 LAB — CBC WITH DIFFERENTIAL (CANCER CENTER ONLY)
Abs Immature Granulocytes: 0.03 10*3/uL (ref 0.00–0.07)
Basophils Absolute: 0 10*3/uL (ref 0.0–0.1)
Basophils Relative: 0 %
Eosinophils Absolute: 0.1 10*3/uL (ref 0.0–0.5)
Eosinophils Relative: 2 %
HCT: 26.4 % — ABNORMAL LOW (ref 36.0–46.0)
Hemoglobin: 8.1 g/dL — ABNORMAL LOW (ref 12.0–15.0)
Immature Granulocytes: 1 %
Lymphocytes Relative: 16 %
Lymphs Abs: 0.8 10*3/uL (ref 0.7–4.0)
MCH: 27.6 pg (ref 26.0–34.0)
MCHC: 30.7 g/dL (ref 30.0–36.0)
MCV: 89.8 fL (ref 80.0–100.0)
Monocytes Absolute: 0.5 10*3/uL (ref 0.1–1.0)
Monocytes Relative: 10 %
Neutro Abs: 3.3 10*3/uL (ref 1.7–7.7)
Neutrophils Relative %: 71 %
Platelet Count: 305 10*3/uL (ref 150–400)
RBC: 2.94 MIL/uL — ABNORMAL LOW (ref 3.87–5.11)
RDW: 19.2 % — ABNORMAL HIGH (ref 11.5–15.5)
WBC Count: 4.7 10*3/uL (ref 4.0–10.5)
nRBC: 0 % (ref 0.0–0.2)

## 2022-12-14 LAB — CMP (CANCER CENTER ONLY)
ALT: 9 U/L (ref 0–44)
AST: 24 U/L (ref 15–41)
Albumin: 3.2 g/dL — ABNORMAL LOW (ref 3.5–5.0)
Alkaline Phosphatase: 160 U/L — ABNORMAL HIGH (ref 38–126)
Anion gap: 10 (ref 5–15)
BUN: 14 mg/dL (ref 6–20)
CO2: 27 mmol/L (ref 22–32)
Calcium: 8.9 mg/dL (ref 8.9–10.3)
Chloride: 97 mmol/L — ABNORMAL LOW (ref 98–111)
Creatinine: 1.1 mg/dL — ABNORMAL HIGH (ref 0.44–1.00)
GFR, Estimated: 60 mL/min — ABNORMAL LOW (ref >60)
Glucose, Bld: 175 mg/dL — ABNORMAL HIGH (ref 70–99)
Potassium: 3.1 mmol/L — ABNORMAL LOW (ref 3.5–5.1)
Sodium: 134 mmol/L — ABNORMAL LOW (ref 135–145)
Total Bilirubin: 0.6 mg/dL (ref 0.3–1.2)
Total Protein: 6.6 g/dL (ref 6.5–8.1)

## 2022-12-14 LAB — FERRITIN: Ferritin: 142 ng/mL (ref 11–307)

## 2022-12-14 MED ORDER — HEPARIN SOD (PORK) LOCK FLUSH 100 UNIT/ML IV SOLN
500.0000 [IU] | INTRAVENOUS | Status: AC | PRN
  Administered 2022-12-14: 500 [IU]

## 2022-12-14 MED ORDER — IOHEXOL 300 MG/ML  SOLN
100.0000 mL | Freq: Once | INTRAMUSCULAR | Status: AC | PRN
  Administered 2022-12-14: 85 mL via INTRAVENOUS

## 2022-12-14 MED ORDER — POTASSIUM CHLORIDE 10 MEQ/100ML IV SOLN
10.0000 meq | INTRAVENOUS | Status: AC
  Administered 2022-12-14 (×2): 10 meq via INTRAVENOUS
  Filled 2022-12-14: qty 100

## 2022-12-14 MED ORDER — SODIUM CHLORIDE 0.9 % IV SOLN: 8.0000 mg | Freq: Once | INTRAVENOUS | Status: DC

## 2022-12-14 MED ORDER — ONDANSETRON HCL 4 MG/2ML IJ SOLN: 8.0000 mg | Freq: Once | INTRAMUSCULAR | Status: DC

## 2022-12-14 MED ORDER — SODIUM CHLORIDE 0.9 % IV SOLN: INTRAVENOUS | Status: DC

## 2022-12-14 MED ORDER — SODIUM CHLORIDE 0.9% FLUSH
10.0000 mL | INTRAVENOUS | Status: AC | PRN
  Administered 2022-12-14: 10 mL

## 2022-12-14 MED ORDER — POTASSIUM CHLORIDE CRYS ER 10 MEQ PO TBCR: 10.0000 meq | EXTENDED_RELEASE_TABLET | Freq: Two times a day (BID) | ORAL | 2 refills | Status: DC

## 2022-12-14 NOTE — Progress Notes (Signed)
[place] Clinical Social Work  Initial Assessment   Bridey Dovidio is a 57 y.o. year old female presenting alone. Clinical Social Work was referred by nurse for assessment of psychosocial needs.   SDOH (Social Determinants of Health) assessments performed: Yes SDOH Interventions    Flowsheet Row Office Visit from 11/18/2022 in [place] at [place] Interventions   Depression Interventions/Treatment  Counseling       SDOH Screenings   Depression [phone]): High Risk (11/18/2022)  Tobacco Use: Medium Risk (11/29/2022)     Distress Screen completed: No     No data to display            Family/Social Information:  Housing Arrangement: patient lives with her niece.   Family members/support persons in your life? 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 yes Concerns about diagnosis and/or treatment: Feelings of anger or sadness Patient reported stressors: Food, Anxiety/ nervousness, and Adjusting to my illness Hopes and/or priorities: None identified. Patient enjoys crafts Current coping skills/ strengths: Capable of independent living , Communication skills , General fund of knowledge , Special hobby/interest , and Supportive family/friends     SUMMARY: Current SDOH Barriers:  Mental Health Concerns   Clinical Social Work Clinical Goal(s):  Patient will work with SW to address concerns related to her mental health and financial stress.  Interventions: Discussed common feeling and emotions when being diagnosed with cancer, and the importance of support during  treatment Informed patient of the support team roles and support services at [facility] Provided CSW contact information and encouraged patient to call with any questions or concerns Provided patient with information about grants for pancreatic cancer and the [employer].  Sent referral to [employer].  Provided patient with one [employer].  Gave her a bag of food and a list of local therapists.   Follow Up Plan: Patient will contact CSW with any support or resource needs Patient verbalizes understanding of plan: Yes    Rodman Pickle Christofer Shen, LCSW   Patient is participating in a Managed Medicaid Plan:  Yes

## 2022-12-14 NOTE — Progress Notes (Signed)
Wendy Hoffman OFFICE PROGRESS NOTE   Diagnosis: Pancreas cancer  INTERVAL HISTORY:   Wendy Hoffman returns as scheduled.  She was last treated with gemcitabine/Abraxane on 11/29/2022.  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  She complains of malaise.  She has intermittent nausea, relieved with ondansetron.  She takes MS Contin and liquid morphine/oxycodone for pain.  Objective:  Vital signs in last 24 hours:  Blood pressure 115/76, pulse 100, temperature 98.1 F (36.7 C), temperature source Oral, resp. rate 20, height _0  (1.626 m), weight 205 lb 8 oz (93.2 kg), SpO2 100 %.    HEENT: No thrush Resp: Distant breath sounds, no respiratory distress Cardio: Regular rate and rhythm GI: Diffuse tenderness throughout the upper abdomen, no mass, no hepatomegaly Vascular: No leg edema, bilateral varicosities over the calf without tenderness  Skin: Ecchymosis at the right lower leg  Portacath/PICC-without erythema  Lab Results:  Lab Results  Component Value Date   WBC 4.7 12/14/2022   HGB 8.1 (L) 12/14/2022   HCT 26.4 (L) 12/14/2022   MCV 89.8 12/14/2022   PLT 305 12/14/2022   NEUTROABS 3.3 12/14/2022    CMP  Lab Results  Component Value Date   NA 134 (L) 12/14/2022   K 3.1 (L) 12/14/2022   CL 97 (L) 12/14/2022   CO2 27 12/14/2022   GLUCOSE 175 (H) 12/14/2022   BUN 14 12/14/2022   CREATININE 1.10 (H) 12/14/2022   CALCIUM 8.9 12/14/2022   PROT 6.6 12/14/2022   ALBUMIN 3.2 (L) 12/14/2022   AST 24 12/14/2022   ALT 9 12/14/2022   ALKPHOS 160 (H) 12/14/2022   BILITOT 0.6 12/14/2022   GFRNONAA 60 (L) 12/14/2022   GFRAA >60 07/21/2020    Lab Results  Component Value Date   CEA1 68.8 (H) 02/23/2021   CEA <0.5 10/15/2008   TML465 8,616 (H) 11/29/2022     Medications: I have reviewed the patient's current medications.   Assessment/Plan: Pancreas cancer -CT abdomen/pelvis without contrast 02/16/2021-multiple large hypoechoic masses in the patient's liver  consistent metastatic disease, ill-defined masslike area in the pancreatic head measuring approximate 4.7 cm, possible filling defect in the right ventricle. -EGD 02/19/2021-large infiltrative mass with bleeding in the second portion of the duodenum, appears to be arising near the ampulla, partially obstructive with friable mucosa.  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 -Flexible sigmoidoscopy 02/19/2021-diverticulosis in the sigmoid colon, nonbleeding external and internal hemorrhoids -Cardiac CT 02/20/2021-mass in the mid RV attached to the interventricular septum measuring 16 mm x 6 mm and concerning for metastatic tumor -MRI abdomen with/without contrast 02/21/2021-mass in the posterior pancreatic head measuring 4.3 x 3.8 cm with obstruction of the central pancreatic duct and diffuse dilatation up to 6 mm consistent with pancreatic adenocarcinoma, liver lesions the largest mass of the central left lobe measuring 9.9 x 9.2 cm consistent with hepatic metastatic disease,  hepatomegaly. -Ultrasound-guided biopsy of a left liver lesion 02/24/2021-adenocarcinoma, morphologically similar to the duodenal biopsy; microsatellite stable, tumor mutation burden 1 -Negative genetic testing -Palliative radiation to the duodenal mass 02/25/2021-03/06/2021 -Cycle 1 FOLFOX 03/17/2021 -Cycle 2 FOLFOX 04/01/2021 -Cycle 3 FOLFIRINOX 04/14/2021 -Cycle 4 FOLFIRINOX 04/27/2021, Udenyca added -Cycle 5 FOLFIRINOX 05/12/2021, Udenyca -MRI abdomen 05/23/2021-decrease in size of pancreas mass and hepatic lesions, no progressive disease -Cycle 6 FOLFIRINOX 05/27/2021 -CT 06/17/2021-multiple liver metastases similar to her prior exam.  Pancreas mass not significantly changed. -CT 06/26/2021-stable pancreas mass, no significant change in size and number of known liver metastases. -CT abdomen/pelvis 07/12/2021-new gastrostomy tube.  Multiple liver masses slightly decreased in size from prior exam.  Pancreas head  mass unchanged. -Cycle 1 gemcitabine/Abraxane 07/30/2021 -Cycle 2 gemcitabine/Abraxane 08/13/2021 -Cycle 3 gemcitabine 08/27/2021, Abraxane held due to significant bilateral toe pain, question neuropathy -Cycle 4 gemcitabine, Abraxane held due to neuropathy, 09/10/2021 -Cycle 5 gemcitabine, Abraxane held due to neuropathy, 09/24/2021 -Cycle 6 gemcitabine, Abraxane held due to neuropathy 10/16/2021 -CTs 10/29/2021-mild progression of multifocal liver metastases; mild increase in size of hypoenhancing mass within the head of the pancreas; new mild pleural nodularity along the posterior lower lobes -Cycle 7 gemcitabine/Abraxane 11/03/2021 -Cycle 8 gemcitabine/Abraxane 11/23/2021 -Cycle 9 gemcitabine/Abraxane 12/10/2021 -Cycle 10 Gemcitabine/Abraxane 12/24/2021 -CT abdomen/pelvis 01/04/2022-decrease size of pancreas head mass, no change in multifocal hepatic metastases -Cycle 11 gemcitabine/Abraxane 01/07/2022 -Cycle 12 gemcitabine/Abraxane 02/04/2022 -Cycle 13 Gemcitabine/Abraxane 02/18/2022 -Cycle 14 gemcitabine/Abraxane 03/11/2022 -Cycle 15 gemcitabine/Abraxane 03/29/2022 -Cycle 16 gemcitabine/Abraxane 04/16/2022 -CT abdomen/pelvis 04/22/2022-mixed response in the liver, some lesions larger, some smaller, no change in pancreas head mass, no evidence of metastatic disease elsewhere in the abdomen or pelvis -Cycle 17 gemcitabine/Abraxane 05/17/2022 -Cycle 18 Gemcitabine/Abraxane 06/01/2022 -Cycle 19 gemcitabine/Abraxane 06/18/2022 -Cycle 20 gemcitabine/Abraxane 07/05/2022 -Cycle 21 gemcitabine/Abraxane 08/09/2022 -Cycle 22 gemcitabine/Abraxane 08/23/2022 -CT abdomen/pelvis 09/01/2022-stable liver metastases, difficult to visualize the pancreas head mass, no evidence of disease progression -Cycle 23 gemcitabine/Abraxane 09/06/2022 -Cycle 24 gemcitabine/Abraxane 09/20/2022 -Cycle 25 gemcitabine/Abraxane 10/18/2022 -Cycle 26 gemcitabine/Abraxane 11/01/2022 -Cycle 27 gemcitabine/Abraxane 11/29/2022 2.  Iron deficiency  anemia-improved -Feraheme 510 mg 02/20/2021 3.  Protein calorie malnutrition 4.  Possible right ventricular filling defect on noncontrast CT scan MRI cardiac morphology 02/21/2021-mass in the mid RV attached to the interventricular septum measures 16 mm x 6 mm.  Mass appears heterogeneous with area of enhancement on LGE imaging.  Mass is concerning for metastatic tumor.  Normal LV size and systolic function.  Normal RV size and systolic function. 5.  Hypertension 6.  History of CVA 7.  Hyperlipidemia 8.  Hypothyroidism 9.  Morbid obesity 10.  Asthma 11.  Migraines 12.  Pain secondary to #1 13.  Port-A-Cath placement 02/25/2021 14.  Hypokalemia 04/01/2021-started a potassium supplement 15.  Hospital admission 06/17/2021-intractable nausea and vomiting EGD 06/19/2021-diffuse gastritis, nonobstructing duodenal mass, biopsy of stomach-no malignancy or H. pylori, duodenal mass-adenocarcinoma 16.  Hospital admission 07/12/2021 with nausea/vomiting, abdominal pain, and hypokalemia 17.  Percutaneous gastrostomy tube placement 07/01/2021 18.  07/21/2022 bilateral lower extremity venous Doppler study-negative for DVT.      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  She has been maintained on gemcitabine/Abraxane since August 2022.  She is tolerating the treatment well.  Her overall clinical status is stable.  She had a restaging CT pelvis today.  I reviewed the images with her.  The final report is pending.  Several of the metastatic liver lesions appear larger, the 1 appeared smaller.  We will wait on the final report to make a decision on continuing the current treatment.  She would like to hold treatment today.  She will receive an IV potassium supplement today and then begin oral potassium replacement.  She has severe anemia.  This may impart account for her malaise.  The anemia is secondary to chemotherapy, chronic disease, and potentially chronic GI bleeding.  We will check a ferritin  level.  She will receive 2 units of packed red blood cells this week.  Ms. Gregary Cromer will return for an office visit in 2 weeks.  Betsy Coder, MD  12/14/2022  12:29 PM

## 2022-12-14 NOTE — Progress Notes (Signed)
Called blood bank to confirm receipt of prepare/transfuse orders for 12/16/22. Called [facility] to arrange pick up for 0900 on 12/16/22.

## 2022-12-14 NOTE — Patient Instructions (Signed)
Potassium Chloride Injection What is this medication? POTASSIUM CHLORIDE (poe TASS i um KLOOR ide) prevents and treats low levels of potassium in your body. Potassium plays an important role in maintaining the health of your kidneys, heart, muscles, and nervous system. This medicine may be used for other purposes; ask your health care provider or pharmacist if you have questions. COMMON BRAND NAME(S): PROAMP What should I tell my care team before I take this medication? They need to know if you have any of these conditions: Addison disease Dehydration Diabetes (high blood sugar) Heart disease High levels of potassium in the blood Irregular heartbeat or rhythm Kidney disease Large areas of burned skin An unusual or allergic reaction to potassium, other medications, foods, dyes, or preservatives Pregnant or trying to get pregnant Breast-feeding How should I use this medication? This medication is injected into a vein. It is given in a hospital or clinic setting. Talk to your care team about the use of this medication in children. Special care may be needed. Overdosage: If you think you have taken too much of this medicine contact a poison control center or emergency room at once. NOTE: This medicine is only for you. Do not share this medicine with others. What if I miss a dose? This does not apply. This medication is not for regular use. What may interact with this medication? Do not take this medication with any of the following: Certain diuretics, such as spironolactone, triamterene Eplerenone Sodium polystyrene sulfonate This medication may also interact with the following: Certain medications for blood pressure or heart disease, such as lisinopril, losartan, quinapril, valsartan Medications that lower your chance of fighting infection, such as cyclosporine, tacrolimus NSAIDs, medications for pain and inflammation, such as ibuprofen or naproxen Other potassium supplements Salt  substitutes This list may not describe all possible interactions. Give your health care provider a list of all the medicines, herbs, non-prescription drugs, or dietary supplements you use. Also tell them if you smoke, drink alcohol, or use illegal drugs. Some items may interact with your medicine. What should I watch for while using this medication? Visit your care team for regular checks on your progress. Tell your care team if your symptoms do not start to get better or if they get worse. You may need blood work while you are taking this medication. Avoid salt substitutes unless you are told otherwise by your care team. What side effects may I notice from receiving this medication? Side effects that you should report to your care team as soon as possible: Allergic reactions--skin rash, itching, hives, swelling of the face, lips, tongue, or throat High potassium level--muscle weakness, fast or irregular heartbeat Side effects that usually do not require medical attention (report to your care team if they continue or are bothersome): Diarrhea Nausea Stomach pain Vomiting This list may not describe all possible side effects. Call your doctor for medical advice about side effects. You may report side effects to FDA at 1-800-FDA-1088. Where should I keep my medication? This medication is given in a hospital or clinic. It will not be stored at home. NOTE: This sheet is a summary. It may not cover all possible information. If you have questions about this medicine, talk to your doctor, pharmacist, or health care provider.  2023 Elsevier/Gold Standard (2021-03-12 00:00:00)  

## 2022-12-15 ENCOUNTER — Other Ambulatory Visit: Payer: Self-pay | Admitting: Nurse Practitioner

## 2022-12-15 ENCOUNTER — Other Ambulatory Visit: Payer: Self-pay | Admitting: *Deleted

## 2022-12-15 ENCOUNTER — Telehealth: Payer: Self-pay

## 2022-12-15 DIAGNOSIS — C250 Malignant neoplasm of head of pancreas: Secondary | ICD-10-CM

## 2022-12-15 DIAGNOSIS — D649 Anemia, unspecified: Secondary | ICD-10-CM

## 2022-12-15 NOTE — Telephone Encounter (Signed)
Telephone call to patient regarding her blood transfusion appointment time for 12/16/2022. Unable to reach patient, left voice message with appointment date and time. Also left message reminding patient to arrive with her blood bank blue bracelet on.

## 2022-12-16 ENCOUNTER — Inpatient Hospital Stay: Payer: Medicaid Other

## 2022-12-16 DIAGNOSIS — E039 Hypothyroidism, unspecified: Secondary | ICD-10-CM | POA: Diagnosis not present

## 2022-12-16 DIAGNOSIS — D649 Anemia, unspecified: Secondary | ICD-10-CM

## 2022-12-16 DIAGNOSIS — Z8673 Personal history of transient ischemic attack (TIA), and cerebral infarction without residual deficits: Secondary | ICD-10-CM | POA: Diagnosis not present

## 2022-12-16 DIAGNOSIS — Z5111 Encounter for antineoplastic chemotherapy: Secondary | ICD-10-CM | POA: Diagnosis not present

## 2022-12-16 DIAGNOSIS — E785 Hyperlipidemia, unspecified: Secondary | ICD-10-CM | POA: Diagnosis not present

## 2022-12-16 DIAGNOSIS — E46 Unspecified protein-calorie malnutrition: Secondary | ICD-10-CM | POA: Diagnosis not present

## 2022-12-16 DIAGNOSIS — G43909 Migraine, unspecified, not intractable, without status migrainosus: Secondary | ICD-10-CM | POA: Diagnosis not present

## 2022-12-16 DIAGNOSIS — I10 Essential (primary) hypertension: Secondary | ICD-10-CM | POA: Diagnosis not present

## 2022-12-16 DIAGNOSIS — Z5189 Encounter for other specified aftercare: Secondary | ICD-10-CM | POA: Diagnosis not present

## 2022-12-16 DIAGNOSIS — E876 Hypokalemia: Secondary | ICD-10-CM | POA: Diagnosis not present

## 2022-12-16 DIAGNOSIS — D509 Iron deficiency anemia, unspecified: Secondary | ICD-10-CM | POA: Diagnosis not present

## 2022-12-16 DIAGNOSIS — J45909 Unspecified asthma, uncomplicated: Secondary | ICD-10-CM | POA: Diagnosis not present

## 2022-12-16 DIAGNOSIS — C787 Secondary malignant neoplasm of liver and intrahepatic bile duct: Secondary | ICD-10-CM | POA: Diagnosis not present

## 2022-12-16 DIAGNOSIS — C250 Malignant neoplasm of head of pancreas: Secondary | ICD-10-CM | POA: Diagnosis not present

## 2022-12-16 DIAGNOSIS — G629 Polyneuropathy, unspecified: Secondary | ICD-10-CM | POA: Diagnosis not present

## 2022-12-16 LAB — CANCER ANTIGEN 19-9: CA 19-9: 8606 U/mL — ABNORMAL HIGH (ref 0–35)

## 2022-12-16 MED ORDER — SODIUM CHLORIDE 0.9% IV SOLUTION
250.0000 mL | Freq: Once | INTRAVENOUS | Status: AC
  Administered 2022-12-16: 250 mL via INTRAVENOUS

## 2022-12-16 MED ORDER — SODIUM CHLORIDE 0.9% FLUSH
10.0000 mL | INTRAVENOUS | Status: AC | PRN
  Administered 2022-12-16: 10 mL

## 2022-12-16 MED ORDER — HEPARIN SOD (PORK) LOCK FLUSH 100 UNIT/ML IV SOLN
500.0000 [IU] | Freq: Every day | INTRAVENOUS | Status: AC | PRN
  Administered 2022-12-16: 500 [IU]

## 2022-12-16 NOTE — Patient Instructions (Signed)

## 2022-12-16 NOTE — Progress Notes (Signed)
Provided patient copy of her CT report and was informed that liver lesions are larger and more are noted. MD said to cancel chemo. F/U on 1/15 to discuss another treatment.

## 2022-12-17 ENCOUNTER — Telehealth: Payer: Self-pay

## 2022-12-17 LAB — TYPE AND SCREEN
ABO/RH(D): A POS
Antibody Screen: NEGATIVE
Unit division: 0
Unit division: 0

## 2022-12-17 LAB — BPAM RBC
Blood Product Expiration Date: 202401242359
Blood Product Expiration Date: 202401252359
ISSUE DATE / TIME: 202401040903
ISSUE DATE / TIME: 202401040903
Unit Type and Rh: 6200
Unit Type and Rh: 6200

## 2022-12-17 NOTE — Telephone Encounter (Signed)
Ran patients benefits via [employer]. [employer] is unable to complete request. Faxed over prior authorization and office notes to [facility] [phone]

## 2022-12-24 ENCOUNTER — Other Ambulatory Visit: Payer: Self-pay | Admitting: *Deleted

## 2022-12-24 ENCOUNTER — Telehealth: Payer: Self-pay | Admitting: Oncology

## 2022-12-24 DIAGNOSIS — E876 Hypokalemia: Secondary | ICD-10-CM

## 2022-12-24 NOTE — Telephone Encounter (Signed)
Pt was referred by Creola Corn. Duffy, MSW, LCSW Clinical Education officer, museum for possible assistance of the [employer].  I spoke w her today and she is going to provide income on her next appt.

## 2022-12-26 ENCOUNTER — Other Ambulatory Visit: Payer: Self-pay | Admitting: Nurse Practitioner

## 2022-12-27 ENCOUNTER — Inpatient Hospital Stay: Payer: Medicaid Other

## 2022-12-27 ENCOUNTER — Encounter: Payer: Self-pay | Admitting: Oncology

## 2022-12-27 ENCOUNTER — Inpatient Hospital Stay (HOSPITAL_BASED_OUTPATIENT_CLINIC_OR_DEPARTMENT_OTHER): Payer: Medicaid Other | Admitting: Oncology

## 2022-12-27 VITALS — BP 120/79 | HR 100 | Temp 98.2°F | Resp 18 | Ht 64.0 in | Wt 202.6 lb

## 2022-12-27 DIAGNOSIS — E876 Hypokalemia: Secondary | ICD-10-CM

## 2022-12-27 DIAGNOSIS — C250 Malignant neoplasm of head of pancreas: Secondary | ICD-10-CM

## 2022-12-27 DIAGNOSIS — C259 Malignant neoplasm of pancreas, unspecified: Secondary | ICD-10-CM

## 2022-12-27 DIAGNOSIS — Z95828 Presence of other vascular implants and grafts: Secondary | ICD-10-CM

## 2022-12-27 DIAGNOSIS — Z5111 Encounter for antineoplastic chemotherapy: Secondary | ICD-10-CM | POA: Diagnosis not present

## 2022-12-27 LAB — CBC WITH DIFFERENTIAL (CANCER CENTER ONLY)
Abs Immature Granulocytes: 0.05 10*3/uL (ref 0.00–0.07)
Basophils Absolute: 0 10*3/uL (ref 0.0–0.1)
Basophils Relative: 0 %
Eosinophils Absolute: 0.1 10*3/uL (ref 0.0–0.5)
Eosinophils Relative: 1 %
HCT: 29.1 % — ABNORMAL LOW (ref 36.0–46.0)
Hemoglobin: 9.2 g/dL — ABNORMAL LOW (ref 12.0–15.0)
Immature Granulocytes: 1 %
Lymphocytes Relative: 11 %
Lymphs Abs: 1 10*3/uL (ref 0.7–4.0)
MCH: 27.1 pg (ref 26.0–34.0)
MCHC: 31.6 g/dL (ref 30.0–36.0)
MCV: 85.8 fL (ref 80.0–100.0)
Monocytes Absolute: 0.9 10*3/uL (ref 0.1–1.0)
Monocytes Relative: 9 %
Neutro Abs: 7.3 10*3/uL (ref 1.7–7.7)
Neutrophils Relative %: 78 %
Platelet Count: 330 10*3/uL (ref 150–400)
RBC: 3.39 MIL/uL — ABNORMAL LOW (ref 3.87–5.11)
RDW: 18 % — ABNORMAL HIGH (ref 11.5–15.5)
WBC Count: 9.3 10*3/uL (ref 4.0–10.5)
nRBC: 0 % (ref 0.0–0.2)

## 2022-12-27 LAB — SAMPLE TO BLOOD BANK

## 2022-12-27 LAB — CMP (CANCER CENTER ONLY)
ALT: 9 U/L (ref 0–44)
AST: 19 U/L (ref 15–41)
Albumin: 3.1 g/dL — ABNORMAL LOW (ref 3.5–5.0)
Alkaline Phosphatase: 182 U/L — ABNORMAL HIGH (ref 38–126)
Anion gap: 8 (ref 5–15)
BUN: 18 mg/dL (ref 6–20)
CO2: 33 mmol/L — ABNORMAL HIGH (ref 22–32)
Calcium: 9.5 mg/dL (ref 8.9–10.3)
Chloride: 93 mmol/L — ABNORMAL LOW (ref 98–111)
Creatinine: 0.92 mg/dL (ref 0.44–1.00)
GFR, Estimated: >60 mL/min (ref >60)
Glucose, Bld: 97 mg/dL (ref 70–99)
Potassium: 3.7 mmol/L (ref 3.5–5.1)
Sodium: 134 mmol/L — ABNORMAL LOW (ref 135–145)
Total Bilirubin: 0.9 mg/dL (ref 0.3–1.2)
Total Protein: 7 g/dL (ref 6.5–8.1)

## 2022-12-27 MED ORDER — FLUCONAZOLE 100 MG PO TABS: 100.0000 mg | ORAL_TABLET | Freq: Every day | ORAL | 1 refills | Status: DC

## 2022-12-27 MED ORDER — ONDANSETRON 8 MG PO TBDP: 8.0000 mg | ORAL_TABLET | Freq: Three times a day (TID) | ORAL | 2 refills | Status: DC | PRN

## 2022-12-27 MED ORDER — OXYCODONE HCL 10 MG PO TABS: 10.0000 mg | ORAL_TABLET | ORAL | 0 refills | Status: DC | PRN

## 2022-12-27 MED ORDER — ZOLPIDEM TARTRATE 10 MG PO TABS: 10.0000 mg | ORAL_TABLET | Freq: Every evening | ORAL | 0 refills | Status: DC | PRN

## 2022-12-27 MED ORDER — ONDANSETRON HCL 8 MG PO TABS: 8.0000 mg | ORAL_TABLET | Freq: Three times a day (TID) | ORAL | 2 refills | Status: DC | PRN

## 2022-12-27 MED ORDER — SODIUM CHLORIDE 0.9% FLUSH
10.0000 mL | Freq: Once | INTRAVENOUS | Status: AC
  Administered 2022-12-27: 10 mL

## 2022-12-27 MED ORDER — MORPHINE SULFATE ER 60 MG PO TBCR: 60.0000 mg | EXTENDED_RELEASE_TABLET | Freq: Two times a day (BID) | ORAL | 0 refills | Status: DC

## 2022-12-27 MED ORDER — HEPARIN SOD (PORK) LOCK FLUSH 100 UNIT/ML IV SOLN
500.0000 [IU] | Freq: Once | INTRAVENOUS | Status: AC
  Administered 2022-12-27: 500 [IU]

## 2022-12-27 MED ORDER — MORPHINE SULFATE (CONCENTRATE) 10 MG/0.5ML PO SOLN: 20.0000 mg | Freq: Three times a day (TID) | ORAL | 0 refills | Status: DC | PRN

## 2022-12-27 NOTE — Progress Notes (Signed)
DISCONTINUE ON PATHWAY REGIMEN - Pancreatic Adenocarcinoma     A cycle is every 28 days:     Nab-paclitaxel (protein bound)      Gemcitabine   **Always confirm dose/schedule in your pharmacy ordering system**  REASON: Disease Progression PRIOR TREATMENT: [id number]: Nab-Paclitaxel (Abraxane) 125 mg/m2 D1, 8, 15 + Gemcitabine 1,000 mg/m2 D1, 8, 15 q28 Days Until Progression or Toxicity TREATMENT RESPONSE: Stable Disease (SD)  START OFF PATHWAY REGIMEN - Pancreatic Adenocarcinoma   [id number]:mFOLFIRINOX (Fluorouracil 2,400 mg/m2/cycle; Irinotecan 150 mg/m2) q14 Days:   A cycle is every 14 days:     Irinotecan      Oxaliplatin      Leucovorin      Fluorouracil   **Always confirm dose/schedule in your pharmacy ordering system**  Patient Characteristics: Metastatic Disease, Third Line and Beyond, MSS/pMMR or MSI Unknown Therapeutic Status: Metastatic Disease Line of Therapy: Third Engineer, civil (consulting) Status: MSS/pMMR Intent of Therapy: Non-Curative / Palliative Intent, Discussed with Patient

## 2022-12-27 NOTE — Progress Notes (Signed)
[facility] OFFICE PROGRESS NOTE   Diagnosis: Pancreas cancer  INTERVAL HISTORY:   Wendy Hoffman returns as scheduled.  She continues to have abdominal pain.  She takes MS Contin, Roxanol, and oxycodone.  She has increased thrush.  No emesis.  She has numbness in the feet, not in the hands.  No new complaint.  Objective:  Vital signs in last 24 hours:  Blood pressure 120/79, pulse 100, temperature 98.2 F (36.8 C), temperature source Oral, resp. rate 18, height '5\' 4"'$  (1.626 m), weight 202 lb 9.6 oz (91.9 kg), SpO2 99 %.    HEENT: Extensive thrush over the tongue and pharynx Resp: Lungs with decreased breath sounds at the right lower posterior chest, no respiratory distress Cardio: Regular rate and rhythm GI: Diffuse tenderness in the upper abdomen, no mass, no hepatosplenomegaly Vascular: No leg edema  Skin: Ecchymosis at the right pretibial area, small ecchymosis versus varices at the medial right lower leg  Portacath/PICC-without erythema  Lab Results:  Lab Results  Component Value Date   WBC 4.7 12/14/2022   HGB 8.1 (L) 12/14/2022   HCT 26.4 (L) 12/14/2022   MCV 89.8 12/14/2022   PLT 305 12/14/2022   NEUTROABS 3.3 12/14/2022    CMP  Lab Results  Component Value Date   NA 134 (L) 12/14/2022   K 3.1 (L) 12/14/2022   CL 97 (L) 12/14/2022   CO2 27 12/14/2022   GLUCOSE 175 (H) 12/14/2022   BUN 14 12/14/2022   CREATININE 1.10 (H) 12/14/2022   CALCIUM 8.9 12/14/2022   PROT 6.6 12/14/2022   ALBUMIN 3.2 (L) 12/14/2022   AST 24 12/14/2022   ALT 9 12/14/2022   ALKPHOS 160 (H) 12/14/2022   BILITOT 0.6 12/14/2022   GFRNONAA 60 (L) 12/14/2022   GFRAA >60 07/21/2020    Lab Results  Component Value Date   CEA1 68.8 (H) 02/23/2021   CEA <0.5 10/15/2008   ZOX096 8,606 (H) 12/14/2022    Medications: I have reviewed the patient's current medications.   Assessment/Plan: Pancreas cancer -CT abdomen/pelvis without contrast 02/16/2021-multiple large  hypoechoic masses in the patient's liver consistent metastatic disease, ill-defined masslike area in the pancreatic head measuring approximate 4.7 cm, possible filling defect in the right ventricle. -EGD 02/19/2021-large infiltrative mass with bleeding in the second portion of the duodenum, appears to be arising near the ampulla, partially obstructive with friable mucosa.  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 -Flexible sigmoidoscopy 02/19/2021-diverticulosis in the sigmoid colon, nonbleeding external and internal hemorrhoids -Cardiac CT 02/20/2021-mass in the mid RV attached to the interventricular septum measuring 16 mm x 6 mm and concerning for metastatic tumor -MRI abdomen with/without contrast 02/21/2021-mass in the posterior pancreatic head measuring 4.3 x 3.8 cm with obstruction of the central pancreatic duct and diffuse dilatation up to 6 mm consistent with pancreatic adenocarcinoma, liver lesions the largest mass of the central left lobe measuring 9.9 x 9.2 cm consistent with hepatic metastatic disease,  hepatomegaly. -Ultrasound-guided biopsy of a left liver lesion 02/24/2021-adenocarcinoma, morphologically similar to the duodenal biopsy; microsatellite stable, tumor mutation burden 1 -Negative genetic testing -Palliative radiation to the duodenal mass 02/25/2021-03/06/2021 -Cycle 1 FOLFOX 03/17/2021 -Cycle 2 FOLFOX 04/01/2021 -Cycle 3 FOLFIRINOX 04/14/2021 -Cycle 4 FOLFIRINOX 04/27/2021, Udenyca added -Cycle 5 FOLFIRINOX 05/12/2021, Udenyca -MRI abdomen 05/23/2021-decrease in size of pancreas mass and hepatic lesions, no progressive disease -Cycle 6 FOLFIRINOX 05/27/2021 -CT 06/17/2021-multiple liver metastases similar to her prior exam.  Pancreas mass not significantly changed. -CT 06/26/2021-stable pancreas mass, no significant  change in size and number of known liver metastases. -CT abdomen/pelvis 07/12/2021-new gastrostomy tube.  Multiple liver masses slightly decreased  in size from prior exam.  Pancreas head mass unchanged. -Cycle 1 gemcitabine/Abraxane 07/30/2021 -Cycle 2 gemcitabine/Abraxane 08/13/2021 -Cycle 3 gemcitabine 08/27/2021, Abraxane held due to significant bilateral toe pain, question neuropathy -Cycle 4 gemcitabine, Abraxane held due to neuropathy, 09/10/2021 -Cycle 5 gemcitabine, Abraxane held due to neuropathy, 09/24/2021 -Cycle 6 gemcitabine, Abraxane held due to neuropathy 10/16/2021 -CTs 10/29/2021-mild progression of multifocal liver metastases; mild increase in size of hypoenhancing mass within the head of the pancreas; new mild pleural nodularity along the posterior lower lobes -Cycle 7 gemcitabine/Abraxane 11/03/2021 -Cycle 8 gemcitabine/Abraxane 11/23/2021 -Cycle 9 gemcitabine/Abraxane 12/10/2021 -Cycle 10 Gemcitabine/Abraxane 12/24/2021 -CT abdomen/pelvis 01/04/2022-decrease size of pancreas head mass, no change in multifocal hepatic metastases -Cycle 11 gemcitabine/Abraxane 01/07/2022 -Cycle 12 gemcitabine/Abraxane 02/04/2022 -Cycle 13 Gemcitabine/Abraxane 02/18/2022 -Cycle 14 gemcitabine/Abraxane 03/11/2022 -Cycle 15 gemcitabine/Abraxane 03/29/2022 -Cycle 16 gemcitabine/Abraxane 04/16/2022 -CT abdomen/pelvis 04/22/2022-mixed response in the liver, some lesions larger, some smaller, no change in pancreas head mass, no evidence of metastatic disease elsewhere in the abdomen or pelvis -Cycle 17 gemcitabine/Abraxane 05/17/2022 -Cycle 18 Gemcitabine/Abraxane 06/01/2022 -Cycle 19 gemcitabine/Abraxane 06/18/2022 -Cycle 20 gemcitabine/Abraxane 07/05/2022 -Cycle 21 gemcitabine/Abraxane 08/09/2022 -Cycle 22 gemcitabine/Abraxane 08/23/2022 -CT abdomen/pelvis 09/01/2022-stable liver metastases, difficult to visualize the pancreas head mass, no evidence of disease progression -Cycle 23 gemcitabine/Abraxane 09/06/2022 -Cycle 24 gemcitabine/Abraxane 09/20/2022 -Cycle 25 gemcitabine/Abraxane 10/18/2022 -Cycle 26 gemcitabine/Abraxane 11/01/2022 -Cycle 27  gemcitabine/Abraxane 11/29/2022 -CT abdomen/pelvis 12/14/2022-increased size of liver metastases, less well-defined pancreas mass, 4 mm right lung nodule 2.  Iron deficiency anemia-improved -Feraheme 510 mg 02/20/2021 3.  Protein calorie malnutrition 4.  Possible right ventricular filling defect on noncontrast CT scan MRI cardiac morphology 02/21/2021-mass in the mid RV attached to the interventricular septum measures 16 mm x 6 mm.  Mass appears heterogeneous with area of enhancement on LGE imaging.  Mass is concerning for metastatic tumor.  Normal LV size and systolic function.  Normal RV size and systolic function. 5.  Hypertension 6.  History of CVA 7.  Hyperlipidemia 8.  Hypothyroidism 9.  Morbid obesity 10.  Asthma 11.  Migraines 12.  Pain secondary to #1 13.  Port-A-Cath placement 02/25/2021 14.  Hypokalemia 04/01/2021-started a potassium supplement 15.  Hospital admission 06/17/2021-intractable nausea and vomiting EGD 06/19/2021-diffuse gastritis, nonobstructing duodenal mass, biopsy of stomach-no malignancy or H. pylori, duodenal mass-adenocarcinoma 16.  Hospital admission 07/12/2021 with nausea/vomiting, abdominal pain, and hypokalemia 17.  Percutaneous gastrostomy tube placement 07/01/2021 18.  07/21/2022 bilateral lower extremity venous Doppler study-negative for DVT.       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  She has been treated with gemcitabine/Abraxane since August 2022.  There is now radiologic evidence of disease progression.  We discussed treatment options including supportive care, referral back to [facility] to consider a clinical trial, changing to 5-FU/liposomal irinotecan, and repeat treatment with FOLFIRINOX.  She completed 6 cycles of FOLFOX/FOLFIRINOX, last given in June 2022.  She did not have clear disease progression while on FOLFIRINOX.  I recommend repeat treatment with FOLFIRINOX.  We reviewed potential toxicities associated with this regimen including  the chance of an allergic reaction when receiving a second course of oxaliplatin.  She agrees to proceed.  She would like to resume FOLFIRINOX on 01/10/2023.  A chemotherapy plan was entered today.  She will be scheduled for an office visit on 01/10/2023.  She will complete a course of Diflucan for oral candidiasis.  I refilled her narcotic  prescriptions today.  Betsy Coder, MD  12/27/2022  11:41 AM

## 2022-12-28 ENCOUNTER — Ambulatory Visit: Payer: Medicaid Other | Admitting: Internal Medicine

## 2022-12-28 ENCOUNTER — Other Ambulatory Visit: Payer: Self-pay

## 2022-12-29 ENCOUNTER — Other Ambulatory Visit: Payer: Self-pay | Admitting: Nurse Practitioner

## 2022-12-29 DIAGNOSIS — C250 Malignant neoplasm of head of pancreas: Secondary | ICD-10-CM

## 2022-12-29 MED ORDER — ZOLPIDEM TARTRATE 10 MG PO TABS: 10.0000 mg | ORAL_TABLET | Freq: Every evening | ORAL | 0 refills | Status: DC | PRN

## 2022-12-29 MED ORDER — OXYCODONE HCL 10 MG PO TABS: 10.0000 mg | ORAL_TABLET | ORAL | 0 refills | Status: DC | PRN

## 2022-12-29 NOTE — Telephone Encounter (Signed)
Per Terry Tracks prior authorization not required.   Patient responsible for $4.00 copay.   Left patient a voicemail to call us back to get scheduled for her Zilretta injection.

## 2023-01-02 ENCOUNTER — Other Ambulatory Visit: Payer: Self-pay | Admitting: Oncology

## 2023-01-09 ENCOUNTER — Other Ambulatory Visit: Payer: Self-pay | Admitting: Oncology

## 2023-01-10 ENCOUNTER — Inpatient Hospital Stay: Payer: Medicaid Other

## 2023-01-10 ENCOUNTER — Telehealth: Payer: Self-pay | Admitting: Nurse Practitioner

## 2023-01-10 ENCOUNTER — Encounter: Payer: Self-pay | Admitting: Nurse Practitioner

## 2023-01-10 ENCOUNTER — Inpatient Hospital Stay (HOSPITAL_BASED_OUTPATIENT_CLINIC_OR_DEPARTMENT_OTHER): Payer: Medicaid Other | Admitting: Nurse Practitioner

## 2023-01-10 VITALS — BP 125/88 | HR 100 | Temp 98.1°F | Resp 18 | Ht 64.0 in | Wt 195.2 lb

## 2023-01-10 VITALS — BP 157/94 | HR 78 | Temp 98.0°F | Resp 18

## 2023-01-10 DIAGNOSIS — C250 Malignant neoplasm of head of pancreas: Secondary | ICD-10-CM

## 2023-01-10 DIAGNOSIS — Z5111 Encounter for antineoplastic chemotherapy: Secondary | ICD-10-CM | POA: Diagnosis not present

## 2023-01-10 LAB — CBC WITH DIFFERENTIAL (CANCER CENTER ONLY)
Abs Immature Granulocytes: 0.03 10*3/uL (ref 0.00–0.07)
Basophils Absolute: 0 10*3/uL (ref 0.0–0.1)
Basophils Relative: 0 %
Eosinophils Absolute: 0.1 10*3/uL (ref 0.0–0.5)
Eosinophils Relative: 1 %
HCT: 31 % — ABNORMAL LOW (ref 36.0–46.0)
Hemoglobin: 9.8 g/dL — ABNORMAL LOW (ref 12.0–15.0)
Immature Granulocytes: 0 %
Lymphocytes Relative: 9 %
Lymphs Abs: 1 10*3/uL (ref 0.7–4.0)
MCH: 26.8 pg (ref 26.0–34.0)
MCHC: 31.6 g/dL (ref 30.0–36.0)
MCV: 84.9 fL (ref 80.0–100.0)
Monocytes Absolute: 0.8 10*3/uL (ref 0.1–1.0)
Monocytes Relative: 8 %
Neutro Abs: 8.4 10*3/uL — ABNORMAL HIGH (ref 1.7–7.7)
Neutrophils Relative %: 82 %
Platelet Count: 336 10*3/uL (ref 150–400)
RBC: 3.65 MIL/uL — ABNORMAL LOW (ref 3.87–5.11)
RDW: 19.2 % — ABNORMAL HIGH (ref 11.5–15.5)
WBC Count: 10.2 10*3/uL (ref 4.0–10.5)
nRBC: 0 % (ref 0.0–0.2)

## 2023-01-10 LAB — CMP (CANCER CENTER ONLY)
ALT: 7 U/L (ref 0–44)
AST: 21 U/L (ref 15–41)
Albumin: 3 g/dL — ABNORMAL LOW (ref 3.5–5.0)
Alkaline Phosphatase: 173 U/L — ABNORMAL HIGH (ref 38–126)
Anion gap: 9 (ref 5–15)
BUN: 17 mg/dL (ref 6–20)
CO2: 32 mmol/L (ref 22–32)
Calcium: 9.4 mg/dL (ref 8.9–10.3)
Chloride: 94 mmol/L — ABNORMAL LOW (ref 98–111)
Creatinine: 1.08 mg/dL — ABNORMAL HIGH (ref 0.44–1.00)
GFR, Estimated: >60 mL/min (ref >60)
Glucose, Bld: 99 mg/dL (ref 70–99)
Potassium: 3.6 mmol/L (ref 3.5–5.1)
Sodium: 135 mmol/L (ref 135–145)
Total Bilirubin: 1 mg/dL (ref 0.3–1.2)
Total Protein: 7.1 g/dL (ref 6.5–8.1)

## 2023-01-10 MED ORDER — SODIUM CHLORIDE 0.9 % IV SOLN
2000.0000 mg/m2 | INTRAVENOUS | Status: DC
  Administered 2023-01-10: 4100 mg via INTRAVENOUS
  Filled 2023-01-10: qty 82

## 2023-01-10 MED ORDER — SODIUM CHLORIDE 0.9 % IV SOLN
150.0000 mg | Freq: Once | INTRAVENOUS | Status: AC
  Administered 2023-01-10: 150 mg via INTRAVENOUS
  Filled 2023-01-10: qty 5

## 2023-01-10 MED ORDER — DEXTROSE 5 % IV SOLN: Freq: Once | INTRAVENOUS | Status: AC

## 2023-01-10 MED ORDER — FAMOTIDINE IN NACL 20-0.9 MG/50ML-% IV SOLN
20.0000 mg | Freq: Once | INTRAVENOUS | Status: AC
  Administered 2023-01-10: 20 mg via INTRAVENOUS
  Filled 2023-01-10: qty 50

## 2023-01-10 MED ORDER — PALONOSETRON HCL INJECTION 0.25 MG/5ML
0.2500 mg | Freq: Once | INTRAVENOUS | Status: AC
  Administered 2023-01-10: 0.25 mg via INTRAVENOUS
  Filled 2023-01-10: qty 5

## 2023-01-10 MED ORDER — SODIUM CHLORIDE 0.9 % IV SOLN
400.0000 mg/m2 | Freq: Once | INTRAVENOUS | Status: AC
  Administered 2023-01-10: 816 mg via INTRAVENOUS
  Filled 2023-01-10: qty 40.8

## 2023-01-10 MED ORDER — OXALIPLATIN CHEMO INJECTION 100 MG/20ML
65.0000 mg/m2 | Freq: Once | INTRAVENOUS | Status: AC
  Administered 2023-01-10: 135 mg via INTRAVENOUS
  Filled 2023-01-10: qty 10

## 2023-01-10 MED ORDER — ATROPINE SULFATE 1 MG/ML IV SOLN
0.5000 mg | Freq: Once | INTRAVENOUS | Status: AC | PRN
  Administered 2023-01-10: 0.5 mg via INTRAVENOUS
  Filled 2023-01-10: qty 1

## 2023-01-10 MED ORDER — SODIUM CHLORIDE 0.9 % IV SOLN
10.0000 mg | Freq: Once | INTRAVENOUS | Status: AC
  Administered 2023-01-10: 10 mg via INTRAVENOUS
  Filled 2023-01-10: qty 1

## 2023-01-10 MED ORDER — SODIUM CHLORIDE 0.9 % IV SOLN
150.0000 mg/m2 | Freq: Once | INTRAVENOUS | Status: AC
  Administered 2023-01-10: 300 mg via INTRAVENOUS
  Filled 2023-01-10: qty 15

## 2023-01-10 NOTE — Patient Instructions (Signed)
[facility]   Discharge Instructions: Thank you for choosing [facility] to provide your oncology and hematology care.   If you have a lab appointment with the [facility], please go directly to the [facility] and check in at the registration area.   Wear comfortable clothing and clothing appropriate for easy access to any Portacath or PICC line.   We strive to give you quality time with your provider. You may need to reschedule your appointment if you arrive late (15 or more minutes).  Arriving late affects you and other patients whose appointments are after yours.  Also, if you miss three or more appointments without notifying the office, you may be dismissed from the clinic at the provider's discretion.      For prescription refill requests, have your pharmacy contact our office and allow 72 hours for refills to be completed.    Today you received the following chemotherapy and/or immunotherapy agents Oxaliplatin (ELOXATIN), Irinotecan (CAMPTOSAR), Leucovorin & Flourouracil (ADRUCIL).       To help prevent nausea and vomiting after your treatment, we encourage you to take your nausea medication as directed.  BELOW ARE SYMPTOMS THAT SHOULD BE REPORTED IMMEDIATELY: *FEVER GREATER THAN 100.4 F (38 C) OR HIGHER *CHILLS OR SWEATING *NAUSEA AND VOMITING THAT IS NOT CONTROLLED WITH YOUR NAUSEA MEDICATION *UNUSUAL SHORTNESS OF BREATH *UNUSUAL BRUISING OR BLEEDING *URINARY PROBLEMS (pain or burning when urinating, or frequent urination) *BOWEL PROBLEMS (unusual diarrhea, constipation, pain near the anus) TENDERNESS IN MOUTH AND THROAT WITH OR WITHOUT PRESENCE OF ULCERS (sore throat, sores in mouth, or a toothache) UNUSUAL RASH, SWELLING OR PAIN  UNUSUAL VAGINAL DISCHARGE OR ITCHING   Items with * indicate a potential emergency and should be followed up as soon as possible or go to the Emergency Department if any problems should  occur.  Please show the CHEMOTHERAPY ALERT CARD or IMMUNOTHERAPY ALERT CARD at check-in to the Emergency Department and triage nurse.  Should you have questions after your visit or need to cancel or reschedule your appointment, please contact [facility]  Dept: [phone]  and follow the prompts.  Office hours are 8:00 a.m. to 4:30 p.m. Monday - Friday. Please note that voicemails left after 4:00 p.m. may not be returned until the following business day.  We are closed weekends and major holidays. You have access to a nurse at all times for urgent questions. Please call the main number to the clinic Dept: [phone] and follow the prompts.   For any non-urgent questions, you may also contact your provider using MyChart. We now offer e-Visits for anyone 69 and older to request care online for non-urgent symptoms. For details visit [email or web address].   Also download the MyChart app! Go to the app store, search "MyChart", open the app, select [facility], and log in with your MyChart username and password.  Oxaliplatin Injection What is this medication? OXALIPLATIN (ox AL i PLA tin) treats some types of cancer. It works by slowing down the growth of cancer cells. This medicine may be used for other purposes; ask your health care provider or pharmacist if you have questions. COMMON BRAND NAME(S): Eloxatin What should I tell my care team before I take this medication? They need to know if you have any of these conditions: Heart disease History of irregular heartbeat or rhythm Liver disease Low blood cell levels (white cells, red cells, and platelets) Lung or breathing disease, such as  asthma Take medications that treat or prevent blood clots Tingling of the fingers, toes, or other nerve disorder An unusual or allergic reaction to oxaliplatin, other medications, foods, dyes, or preservatives If you or your partner are pregnant or trying to get  pregnant Breast-feeding How should I use this medication? This medication is injected into a vein. It is given by your care team in a hospital or clinic setting. Talk to your care team about the use of this medication in children. Special care may be needed. Overdosage: If you think you have taken too much of this medicine contact a poison control center or emergency room at once. NOTE: This medicine is only for you. Do not share this medicine with others. What if I miss a dose? Keep appointments for follow-up doses. It is important not to miss a dose. Call your care team if you are unable to keep an appointment. What may interact with this medication? Do not take this medication with any of the following: Cisapride Dronedarone Pimozide Thioridazine This medication may also interact with the following: Aspirin and aspirin-like medications Certain medications that treat or prevent blood clots, such as warfarin, apixaban, dabigatran, and rivaroxaban Cisplatin Cyclosporine Diuretics Medications for infection, such as acyclovir, adefovir, amphotericin B, bacitracin, cidofovir, foscarnet, ganciclovir, gentamicin, pentamidine, vancomycin NSAIDs, medications for pain and inflammation, such as ibuprofen or naproxen Other medications that cause heart rhythm changes Pamidronate Zoledronic acid This list may not describe all possible interactions. Give your health care provider a list of all the medicines, herbs, non-prescription drugs, or dietary supplements you use. Also tell them if you smoke, drink alcohol, or use illegal drugs. Some items may interact with your medicine. What should I watch for while using this medication? Your condition will be monitored carefully while you are receiving this medication. You may need blood work while taking this medication. This medication may make you feel generally unwell. This is not uncommon as chemotherapy can affect healthy cells as well as cancer  cells. Report any side effects. Continue your course of treatment even though you feel ill unless your care team tells you to stop. This medication may increase your risk of getting an infection. Call your care team for advice if you get a fever, chills, sore throat, or other symptoms of a cold or flu. Do not treat yourself. Try to avoid being around people who are sick. Avoid taking medications that contain aspirin, acetaminophen, ibuprofen, naproxen, or ketoprofen unless instructed by your care team. These medications may hide a fever. Be careful brushing or flossing your teeth or using a toothpick because you may get an infection or bleed more easily. If you have any dental work done, tell your dentist you are receiving this medication. This medication can make you more sensitive to cold. Do not drink cold drinks or use ice. Cover exposed skin before coming in contact with cold temperatures or cold objects. When out in cold weather wear warm clothing and cover your mouth and nose to warm the air that goes into your lungs. Tell your care team if you get sensitive to the cold. Talk to your care team if you or your partner are pregnant or think either of you might be pregnant. This medication can cause serious birth defects if taken during pregnancy and for 9 months after the last dose. A negative pregnancy test is required before starting this medication. A reliable form of contraception is recommended while taking this medication and for 9 months after the last  dose. Talk to your care team about effective forms of contraception. Do not father a child while taking this medication and for 6 months after the last dose. Use a condom while having sex during this time period. Do not breastfeed while taking this medication and for 3 months after the last dose. This medication may cause infertility. Talk to your care team if you are concerned about your fertility. What side effects may I notice from receiving this  medication? Side effects that you should report to your care team as soon as possible: Allergic reactions--skin rash, itching, hives, swelling of the face, lips, tongue, or throat Bleeding--bloody or black, tar-like stools, vomiting blood or brown material that looks like coffee grounds, red or dark brown urine, small red or purple spots on skin, unusual bruising or bleeding Dry cough, shortness of breath or trouble breathing Heart rhythm changes--fast or irregular heartbeat, dizziness, feeling faint or lightheaded, chest pain, trouble breathing Infection--fever, chills, cough, sore throat, wounds that don't heal, pain or trouble when passing urine, general feeling of discomfort or being unwell Liver injury--right upper belly pain, loss of appetite, nausea, light-colored stool, dark yellow or brown urine, yellowing skin or eyes, unusual weakness or fatigue Low red blood cell level--unusual weakness or fatigue, dizziness, headache, trouble breathing Muscle injury--unusual weakness or fatigue, muscle pain, dark yellow or brown urine, decrease in amount of urine Pain, tingling, or numbness in the hands or feet Sudden and severe headache, confusion, change in vision, seizures, which may be signs of posterior reversible encephalopathy syndrome (PRES) Unusual bruising or bleeding Side effects that usually do not require medical attention (report to your care team if they continue or are bothersome): Diarrhea Nausea Pain, redness, or swelling with sores inside the mouth or throat Unusual weakness or fatigue Vomiting This list may not describe all possible side effects. Call your doctor for medical advice about side effects. You may report side effects to FDA at 1-800-FDA-1088. Where should I keep my medication? This medication is given in a hospital or clinic. It will not be stored at home. NOTE: This sheet is a summary. It may not cover all possible information. If you have questions about this  medicine, talk to your doctor, pharmacist, or health care provider.  2023 Elsevier/Gold Standard (2008-01-20 00:00:00)  Irinotecan Injection What is this medication? IRINOTECAN (ir in oh TEE kan) treats some types of cancer. It works by slowing down the growth of cancer cells. This medicine may be used for other purposes; ask your health care provider or pharmacist if you have questions. COMMON BRAND NAME(S): Camptosar What should I tell my care team before I take this medication? They need to know if you have any of these conditions: Dehydration Diarrhea Infection, especially a viral infection, such as chickenpox, cold sores, herpes Liver disease Low blood cell levels (white cells, red cells, and platelets) Low levels of electrolytes, such as calcium, magnesium, or potassium in your blood Recent or ongoing radiation An unusual or allergic reaction to irinotecan, other medications, foods, dyes, or preservatives If you or your partner are pregnant or trying to get pregnant Breast-feeding How should I use this medication? This medication is injected into a vein. It is given by your care team in a hospital or clinic setting. Talk to your care team about the use of this medication in children. Special care may be needed. Overdosage: If you think you have taken too much of this medicine contact a poison control center or emergency room at once.  NOTE: This medicine is only for you. Do not share this medicine with others. What if I miss a dose? Keep appointments for follow-up doses. It is important not to miss your dose. Call your care team if you are unable to keep an appointment. What may interact with this medication? Do not take this medication with any of the following: Cobicistat Itraconazole This medication may also interact with the following: Certain antibiotics, such as clarithromycin, rifampin, rifabutin Certain antivirals for HIV or AIDS Certain medications for fungal  infections, such as ketoconazole, posaconazole, voriconazole Certain medications for seizures, such as carbamazepine, phenobarbital, phenytoin Gemfibrozil Nefazodone St. John's wort This list may not describe all possible interactions. Give your health care provider a list of all the medicines, herbs, non-prescription drugs, or dietary supplements you use. Also tell them if you smoke, drink alcohol, or use illegal drugs. Some items may interact with your medicine. What should I watch for while using this medication? Your condition will be monitored carefully while you are receiving this medication. You may need blood work while taking this medication. This medication may make you feel generally unwell. This is not uncommon as chemotherapy can affect healthy cells as well as cancer cells. Report any side effects. Continue your course of treatment even though you feel ill unless your care team tells you to stop. This medication can cause serious side effects. To reduce the risk, your care team may give you other medications to take before receiving this one. Be sure to follow the directions from your care team. This medication may affect your coordination, reaction time, or judgement. Do not drive or operate machinery until you know how this medication affects you. Sit up or stand slowly to reduce the risk of dizzy or fainting spells. Drinking alcohol with this medication can increase the risk of these side effects. This medication may increase your risk of getting an infection. Call your care team for advice if you get a fever, chills, sore throat, or other symptoms of a cold or flu. Do not treat yourself. Try to avoid being around people who are sick. Avoid taking medications that contain aspirin, acetaminophen, ibuprofen, naproxen, or ketoprofen unless instructed by your care team. These medications may hide a fever. This medication may increase your risk to bruise or bleed. Call your care team if you  notice any unusual bleeding. Be careful brushing or flossing your teeth or using a toothpick because you may get an infection or bleed more easily. If you have any dental work done, tell your dentist you are receiving this medication. Talk to your care team if you or your partner are pregnant or think either of you might be pregnant. This medication can cause serious birth defects if taken during pregnancy and for 6 months after the last dose. You will need a negative pregnancy test before starting this medication. Contraception is recommended while taking this medication and for 6 months after the last dose. Your care team can help you find the option that works for you. Do not father a child while taking this medication and for 3 months after the last dose. Use a condom for contraception during this time period. Do not breastfeed while taking this medication and for 7 days after the last dose. This medication may cause infertility. Talk to your care team if you are concerned about your fertility. What side effects may I notice from receiving this medication? Side effects that you should report to your care team as soon as  possible: Allergic reactions--skin rash, itching, hives, swelling of the face, lips, tongue, or throat Dry cough, shortness of breath or trouble breathing Increased saliva or tears, increased sweating, stomach cramping, diarrhea, small pupils, unusual weakness or fatigue, slow heartbeat Infection--fever, chills, cough, sore throat, wounds that don't heal, pain or trouble when passing urine, general feeling of discomfort or being unwell Kidney injury--decrease in the amount of urine, swelling of the ankles, hands, or feet Low red blood cell level--unusual weakness or fatigue, dizziness, headache, trouble breathing Severe or prolonged diarrhea Unusual bruising or bleeding Side effects that usually do not require medical attention (report to your care team if they continue or are  bothersome): Constipation Diarrhea Hair loss Loss of appetite Nausea Stomach pain This list may not describe all possible side effects. Call your doctor for medical advice about side effects. You may report side effects to FDA at 1-800-FDA-1088. Where should I keep my medication? This medication is given in a hospital or clinic. It will not be stored at home. NOTE: This sheet is a summary. It may not cover all possible information. If you have questions about this medicine, talk to your doctor, pharmacist, or health care provider.  2023 Elsevier/Gold Standard (2022-04-08 00:00:00)  Leucovorin Injection What is this medication? LEUCOVORIN (loo koe VOR in) prevents side effects from certain medications, such as methotrexate. It works by increasing folate levels. This helps protect healthy cells in your body. It may also be used to treat anemia caused by low levels of folate. It can also be used with fluorouracil, a type of chemotherapy, to treat colorectal cancer. It works by increasing the effects of fluorouracil in the body. This medicine may be used for other purposes; ask your health care provider or pharmacist if you have questions. What should I tell my care team before I take this medication? They need to know if you have any of these conditions: Anemia from low levels of vitamin B12 in the blood An unusual or allergic reaction to leucovorin, folic acid, other medications, foods, dyes, or preservatives Pregnant or trying to get pregnant Breastfeeding How should I use this medication? This medication is injected into a vein or a muscle. It is given by your care team in a hospital or clinic setting. Talk to your care team about the use of this medication in children. Special care may be needed. Overdosage: If you think you have taken too much of this medicine contact a poison control center or emergency room at once. NOTE: This medicine is only for you. Do not share this medicine with  others. What if I miss a dose? Keep appointments for follow-up doses. It is important not to miss your dose. Call your care team if you are unable to keep an appointment. What may interact with this medication? Capecitabine Fluorouracil Phenobarbital Phenytoin Primidone Trimethoprim;sulfamethoxazole This list may not describe all possible interactions. Give your health care provider a list of all the medicines, herbs, non-prescription drugs, or dietary supplements you use. Also tell them if you smoke, drink alcohol, or use illegal drugs. Some items may interact with your medicine. What should I watch for while using this medication? Your condition will be monitored carefully while you are receiving this medication. This medication may increase the side effects of 5-fluorouracil. Tell your care team if you have diarrhea or mouth sores that do not get better or that get worse. What side effects may I notice from receiving this medication? Side effects that you should report to your  care team as soon as possible: Allergic reactions--skin rash, itching, hives, swelling of the face, lips, tongue, or throat This list may not describe all possible side effects. Call your doctor for medical advice about side effects. You may report side effects to FDA at 1-800-FDA-1088. Where should I keep my medication? This medication is given in a hospital or clinic. It will not be stored at home. NOTE: This sheet is a summary. It may not cover all possible information. If you have questions about this medicine, talk to your doctor, pharmacist, or health care provider.  2023 Elsevier/Gold Standard (2022-05-04 00:00:00)  Fluorouracil Injection What is this medication? FLUOROURACIL (flure oh YOOR a sil) treats some types of cancer. It works by slowing down the growth of cancer cells. This medicine may be used for other purposes; ask your health care provider or pharmacist if you have questions. COMMON BRAND  NAME(S): Adrucil What should I tell my care team before I take this medication? They need to know if you have any of these conditions: Blood disorders Dihydropyrimidine dehydrogenase (DPD) deficiency Infection, such as chickenpox, cold sores, herpes Kidney disease Liver disease Poor nutrition Recent or ongoing radiation therapy An unusual or allergic reaction to fluorouracil, other medications, foods, dyes, or preservatives If you or your partner are pregnant or trying to get pregnant Breast-feeding How should I use this medication? This medication is injected into a vein. It is administered by your care team in a hospital or clinic setting. Talk to your care team about the use of this medication in children. Special care may be needed. Overdosage: If you think you have taken too much of this medicine contact a poison control center or emergency room at once. NOTE: This medicine is only for you. Do not share this medicine with others. What if I miss a dose? Keep appointments for follow-up doses. It is important not to miss your dose. Call your care team if you are unable to keep an appointment. What may interact with this medication? Do not take this medication with any of the following: Live virus vaccines This medication may also interact with the following: Medications that treat or prevent blood clots, such as warfarin, enoxaparin, dalteparin This list may not describe all possible interactions. Give your health care provider a list of all the medicines, herbs, non-prescription drugs, or dietary supplements you use. Also tell them if you smoke, drink alcohol, or use illegal drugs. Some items may interact with your medicine. What should I watch for while using this medication? Your condition will be monitored carefully while you are receiving this medication. This medication may make you feel generally unwell. This is not uncommon as chemotherapy can affect healthy cells as well as  cancer cells. Report any side effects. Continue your course of treatment even though you feel ill unless your care team tells you to stop. In some cases, you may be given additional medications to help with side effects. Follow all directions for their use. This medication may increase your risk of getting an infection. Call your care team for advice if you get a fever, chills, sore throat, or other symptoms of a cold or flu. Do not treat yourself. Try to avoid being around people who are sick. This medication may increase your risk to bruise or bleed. Call your care team if you notice any unusual bleeding. Be careful brushing or flossing your teeth or using a toothpick because you may get an infection or bleed more easily. If you have  any dental work done, tell your dentist you are receiving this medication. Avoid taking medications that contain aspirin, acetaminophen, ibuprofen, naproxen, or ketoprofen unless instructed by your care team. These medications may hide a fever. Do not treat diarrhea with over the counter products. Contact your care team if you have diarrhea that lasts more than 2 days or if it is severe and watery. This medication can make you more sensitive to the sun. Keep out of the sun. If you cannot avoid being in the sun, wear protective clothing and sunscreen. Do not use sun lamps, tanning beds, or tanning booths. Talk to your care team if you or your partner wish to become pregnant or think you might be pregnant. This medication can cause serious birth defects if taken during pregnancy and for 3 months after the last dose. A reliable form of contraception is recommended while taking this medication and for 3 months after the last dose. Talk to your care team about effective forms of contraception. Do not father a child while taking this medication and for 3 months after the last dose. Use a condom while having sex during this time period. Do not breastfeed while taking this  medication. This medication may cause infertility. Talk to your care team if you are concerned about your fertility. What side effects may I notice from receiving this medication? Side effects that you should report to your care team as soon as possible: Allergic reactions--skin rash, itching, hives, swelling of the face, lips, tongue, or throat Heart attack--pain or tightness in the chest, shoulders, arms, or jaw, nausea, shortness of breath, cold or clammy skin, feeling faint or lightheaded Heart failure--shortness of breath, swelling of the ankles, feet, or hands, sudden weight gain, unusual weakness or fatigue Heart rhythm changes--fast or irregular heartbeat, dizziness, feeling faint or lightheaded, chest pain, trouble breathing High ammonia level--unusual weakness or fatigue, confusion, loss of appetite, nausea, vomiting, seizures Infection--fever, chills, cough, sore throat, wounds that don't heal, pain or trouble when passing urine, general feeling of discomfort or being unwell Low red blood cell level--unusual weakness or fatigue, dizziness, headache, trouble breathing Pain, tingling, or numbness in the hands or feet, muscle weakness, change in vision, confusion or trouble speaking, loss of balance or coordination, trouble walking, seizures Redness, swelling, and blistering of the skin over hands and feet Severe or prolonged diarrhea Unusual bruising or bleeding Side effects that usually do not require medical attention (report to your care team if they continue or are bothersome): Dry skin Headache Increased tears Nausea Pain, redness, or swelling with sores inside the mouth or throat Sensitivity to light Vomiting This list may not describe all possible side effects. Call your doctor for medical advice about side effects. You may report side effects to FDA at 1-800-FDA-1088. Where should I keep my medication? This medication is given in a hospital or clinic. It will not be stored  at home. NOTE: This sheet is a summary. It may not cover all possible information. If you have questions about this medicine, talk to your doctor, pharmacist, or health care provider.  2023 Elsevier/Gold Standard (2022-03-30 00:00:00)  The chemotherapy medication bag should finish at 46 hours, 96 hours, or 7 days. For example, if your pump is scheduled for 46 hours and it was put on at 4:00 p.m., it should finish at 2:00 p.m. the day it is scheduled to come off regardless of your appointment time.     Estimated time to finish at 2:30 p.m. on Wednesday 01/12/2023.  If the display on your pump reads "Low Volume" and it is beeping, take the batteries out of the pump and come to the cancer center for it to be taken off.   If the pump alarms go off prior to the pump reading "Low Volume" then call [phone] and someone can assist you.  If the plunger comes out and the chemotherapy medication is leaking out, please use your home chemo spill kit to clean up the spill. Do NOT use paper towels or other household products.  If you have problems or questions regarding your pump, please call either [phone] (24 hours a day) or the cancer center Monday-Friday 8:00 a.m.- 4:30 p.m. at the clinic number and we will assist you. If you are unable to get assistance, then go to the nearest Emergency Department and ask the staff to contact the IV team for assistance.

## 2023-01-10 NOTE — Telephone Encounter (Signed)
Pt requested oxycodone refill today. A prescription was sent to the pharmacy on 12/29/22 that wasn't picked up. T/C to [facility] to inquire about prescription I was informed Pt needed a prior authorization for medication. Prior authorization request was scanned to [place] today.

## 2023-01-10 NOTE — Progress Notes (Signed)
Patient seen by Lisa Thomas NP today  Vitals are within treatment parameters.  Labs reviewed by Lisa Thomas NP and are within treatment parameters.  Per physician team, patient is ready for treatment and there are NO modifications to the treatment plan.     

## 2023-01-10 NOTE — Progress Notes (Signed)
Patient seen today for her Folfirinox treatment. She was very sleepy through her Flush and infusion appointment. Patient reported to this nurse that she did not get any sleep at night due to her insomnia hence her inability stay awake during treatment. Patient tolerated her treatment without any concerns. She was assisted to the ground lobby via wheelchair to await her transportation home. She knows to return on Wednesday 01/12/2023 at 2:30 pm for her pump stop appointment. She also knows to call the clinic with any concerns.

## 2023-01-10 NOTE — Progress Notes (Signed)
[facility] OFFICE PROGRESS NOTE   Diagnosis: Pancreas cancer  INTERVAL HISTORY:   Wendy Hoffman returns as scheduled.  She is seen today prior to resuming treatment with FOLFIRINOX.  She is tired this morning.  She has mild intermittent nausea.  No vomiting.  No constipation or diarrhea.  She reports a sore at the left upper inner lip.  Stable mild tingling in the feet.  Objective:  Vital signs in last 24 hours:  Blood pressure 125/88, pulse 100, temperature 98.1 F (36.7 C), resp. rate 18, height '5\' 4"'$  (1.626 m), weight 195 lb 3.2 oz (88.5 kg), SpO2 96 %.    HEENT: White coating over tongue.  No buccal thrush.  Superficial ulceration left upper inner lip. Resp: Faint scattered wheezes.  No respiratory distress. Cardio: Regular rate and rhythm. GI: Tenderness throughout the upper abdomen.  No hepatosplenomegaly. Vascular: No leg edema. Skin: A few ecchymoses right lower leg. Port-A-Cath without erythema.  Lab Results:  Lab Results  Component Value Date   WBC 10.2 01/10/2023   HGB 9.8 (L) 01/10/2023   HCT 31.0 (L) 01/10/2023   MCV 84.9 01/10/2023   PLT 336 01/10/2023   NEUTROABS 8.4 (H) 01/10/2023    Imaging:  No results found.  Medications: I have reviewed the patient's current medications.  Assessment/Plan: Pancreas cancer -CT abdomen/pelvis without contrast 02/16/2021-multiple large hypoechoic masses in the patient's liver consistent metastatic disease, ill-defined masslike area in the pancreatic head measuring approximate 4.7 cm, possible filling defect in the right ventricle. -EGD 02/19/2021-large infiltrative mass with bleeding in the second portion of the duodenum, appears to be arising near the ampulla, partially obstructive with friable mucosa.  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 -Flexible sigmoidoscopy 02/19/2021-diverticulosis in the sigmoid colon, nonbleeding external and internal hemorrhoids -Cardiac CT  02/20/2021-mass in the mid RV attached to the interventricular septum measuring 16 mm x 6 mm and concerning for metastatic tumor -MRI abdomen with/without contrast 02/21/2021-mass in the posterior pancreatic head measuring 4.3 x 3.8 cm with obstruction of the central pancreatic duct and diffuse dilatation up to 6 mm consistent with pancreatic adenocarcinoma, liver lesions the largest mass of the central left lobe measuring 9.9 x 9.2 cm consistent with hepatic metastatic disease,  hepatomegaly. -Ultrasound-guided biopsy of a left liver lesion 02/24/2021-adenocarcinoma, morphologically similar to the duodenal biopsy; microsatellite stable, tumor mutation burden 1 -Negative genetic testing -Palliative radiation to the duodenal mass 02/25/2021-03/06/2021 -Cycle 1 FOLFOX 03/17/2021 -Cycle 2 FOLFOX 04/01/2021 -Cycle 3 FOLFIRINOX 04/14/2021 -Cycle 4 FOLFIRINOX 04/27/2021, Udenyca added -Cycle 5 FOLFIRINOX 05/12/2021, Udenyca -MRI abdomen 05/23/2021-decrease in size of pancreas mass and hepatic lesions, no progressive disease -Cycle 6 FOLFIRINOX 05/27/2021 -CT 06/17/2021-multiple liver metastases similar to her prior exam.  Pancreas mass not significantly changed. -CT 06/26/2021-stable pancreas mass, no significant change in size and number of known liver metastases. -CT abdomen/pelvis 07/12/2021-new gastrostomy tube.  Multiple liver masses slightly decreased in size from prior exam.  Pancreas head mass unchanged. -Cycle 1 gemcitabine/Abraxane 07/30/2021 -Cycle 2 gemcitabine/Abraxane 08/13/2021 -Cycle 3 gemcitabine 08/27/2021, Abraxane held due to significant bilateral toe pain, question neuropathy -Cycle 4 gemcitabine, Abraxane held due to neuropathy, 09/10/2021 -Cycle 5 gemcitabine, Abraxane held due to neuropathy, 09/24/2021 -Cycle 6 gemcitabine, Abraxane held due to neuropathy 10/16/2021 -CTs 10/29/2021-mild progression of multifocal liver metastases; mild increase in size of hypoenhancing mass within the head of the  pancreas; new mild pleural nodularity along the posterior lower lobes -Cycle 7 gemcitabine/Abraxane 11/03/2021 -Cycle 8 gemcitabine/Abraxane 11/23/2021 -Cycle 9 gemcitabine/Abraxane 12/10/2021 -Cycle 10  Gemcitabine/Abraxane 12/24/2021 -CT abdomen/pelvis 01/04/2022-decrease size of pancreas head mass, no change in multifocal hepatic metastases -Cycle 11 gemcitabine/Abraxane 01/07/2022 -Cycle 12 gemcitabine/Abraxane 02/04/2022 -Cycle 13 Gemcitabine/Abraxane 02/18/2022 -Cycle 14 gemcitabine/Abraxane 03/11/2022 -Cycle 15 gemcitabine/Abraxane 03/29/2022 -Cycle 16 gemcitabine/Abraxane 04/16/2022 -CT abdomen/pelvis 04/22/2022-mixed response in the liver, some lesions larger, some smaller, no change in pancreas head mass, no evidence of metastatic disease elsewhere in the abdomen or pelvis -Cycle 17 gemcitabine/Abraxane 05/17/2022 -Cycle 18 Gemcitabine/Abraxane 06/01/2022 -Cycle 19 gemcitabine/Abraxane 06/18/2022 -Cycle 20 gemcitabine/Abraxane 07/05/2022 -Cycle 21 gemcitabine/Abraxane 08/09/2022 -Cycle 22 gemcitabine/Abraxane 08/23/2022 -CT abdomen/pelvis 09/01/2022-stable liver metastases, difficult to visualize the pancreas head mass, no evidence of disease progression -Cycle 23 gemcitabine/Abraxane 09/06/2022 -Cycle 24 gemcitabine/Abraxane 09/20/2022 -Cycle 25 gemcitabine/Abraxane 10/18/2022 -Cycle 26 gemcitabine/Abraxane 11/01/2022 -Cycle 27 gemcitabine/Abraxane 11/29/2022 -CT abdomen/pelvis 12/14/2022-increased size of liver metastases, less well-defined pancreas mass, 4 mm right lung nodule -Cycle 1 FOLFIRINOX 01/10/2023 2.  Iron deficiency anemia-improved -Feraheme 510 mg 02/20/2021 3.  Protein calorie malnutrition 4.  Possible right ventricular filling defect on noncontrast CT scan MRI cardiac morphology 02/21/2021-mass in the mid RV attached to the interventricular septum measures 16 mm x 6 mm.  Mass appears heterogeneous with area of enhancement on LGE imaging.  Mass is concerning for metastatic tumor.  Normal  LV size and systolic function.  Normal RV size and systolic function. 5.  Hypertension 6.  History of CVA 7.  Hyperlipidemia 8.  Hypothyroidism 9.  Morbid obesity 10.  Asthma 11.  Migraines 12.  Pain secondary to #1 13.  Port-A-Cath placement 02/25/2021 14.  Hypokalemia 04/01/2021-started a potassium supplement 15.  Hospital admission 06/17/2021-intractable nausea and vomiting EGD 06/19/2021-diffuse gastritis, nonobstructing duodenal mass, biopsy of stomach-no malignancy or H. pylori, duodenal mass-adenocarcinoma 16.  Hospital admission 07/12/2021 with nausea/vomiting, abdominal pain, and hypokalemia 17.  Percutaneous gastrostomy tube placement 07/01/2021 18.  07/21/2022 bilateral lower extremity venous Doppler study-negative for DVT.      Disposition: Wendy Hoffman appears unchanged.  She is scheduled to begin retreatment today with FOLFIRINOX.  We again reviewed potential toxicities.  She agrees to proceed.  CBC and chemistry panel reviewed.  Labs adequate to proceed with treatment as above.  She will return for lab, follow-up, cycle 2 FOLFIRINOX in 2 weeks.  We are available to see her sooner if needed.  [name]   01/10/2023  9:48 AM

## 2023-01-11 ENCOUNTER — Other Ambulatory Visit: Payer: Self-pay

## 2023-01-11 ENCOUNTER — Telehealth: Payer: Self-pay

## 2023-01-11 NOTE — Telephone Encounter (Signed)
24 Hour Call Back  Telephone call to patient post her Folfirinox infusion yesterday. Patient complained of having some nausea and has been taking her nausea medication. She also complained of some cold sensitivity and tingling in her hands and feet. She denied drinking or eating anything cold. This nurse reminded patient to dress warm and avoid cold spaces. Patient was also encouraged to wear socks on her feet and gloves/mittens for her hands. She was also advised to wear a face mask when going out or going into the refrigerator. Patient was was encouraged to stay hydrated by drinking lots of water. Patient was agreeable and verbalized understanding. She will be seen on Wednesday 01/12/23 at 2:30 pm for her pump stop appointment. She knows to call the clinic with any complains or concerns.

## 2023-01-12 ENCOUNTER — Encounter: Payer: Self-pay | Admitting: *Deleted

## 2023-01-12 ENCOUNTER — Inpatient Hospital Stay: Payer: Medicaid Other

## 2023-01-12 VITALS — BP 123/86 | HR 79 | Temp 96.7°F | Resp 18

## 2023-01-12 DIAGNOSIS — C250 Malignant neoplasm of head of pancreas: Secondary | ICD-10-CM

## 2023-01-12 DIAGNOSIS — Z5111 Encounter for antineoplastic chemotherapy: Secondary | ICD-10-CM | POA: Diagnosis not present

## 2023-01-12 LAB — CANCER ANTIGEN 19-9: CA 19-9: 9531 U/mL — ABNORMAL HIGH (ref 0–35)

## 2023-01-12 MED ORDER — PEGFILGRASTIM-CBQV 6 MG/0.6ML ~~LOC~~ SOSY
6.0000 mg | PREFILLED_SYRINGE | Freq: Once | SUBCUTANEOUS | Status: AC
  Administered 2023-01-12: 6 mg via SUBCUTANEOUS
  Filled 2023-01-12: qty 0.6

## 2023-01-12 MED ORDER — SODIUM CHLORIDE 0.9% FLUSH
10.0000 mL | INTRAVENOUS | Status: DC | PRN
  Administered 2023-01-12: 10 mL

## 2023-01-12 MED ORDER — HEPARIN SOD (PORK) LOCK FLUSH 100 UNIT/ML IV SOLN
500.0000 [IU] | Freq: Once | INTRAVENOUS | Status: AC | PRN
  Administered 2023-01-12: 500 [IU]

## 2023-01-12 NOTE — Progress Notes (Signed)
Plan approved Oxycontin HCL 10 mg tablets on 01/12/23.

## 2023-01-12 NOTE — Patient Instructions (Signed)

## 2023-01-13 ENCOUNTER — Ambulatory Visit: Payer: Medicaid Other | Attending: Internal Medicine | Admitting: Internal Medicine

## 2023-01-13 NOTE — Progress Notes (Incomplete)
Cardiology Office Note:    Date:  01/13/2023   ID:  Wendy Hoffman, DOB November 05, 1966, MRN [id number]  PCP:  Marrian Salvage, [place] Providers Cardiologist:  None { Click to update primary MD,subspecialty MD or APP then REFRESH:1}    Referring MD: Marrian Salvage,*   No chief complaint on file. ***  History of Present Illness:    Wendy Hoffman is a 57 y.o. female with a hx of anxiety, asthma, thyroid dx,HTN, stroke, morbid obesity referral for chest pain.   She has pancreatic CA with metastatic disease to her heart. She's been on FOLFIRINOX. She has restarted 01/10/2023.    Cardiology Studies: TTE 02/17/2021- EF 55-60%, RV fxn, no significant valve dx CMR 02/20/2021-Mass in mid RV attached to interventricular septum measures 16 mm x 73m. Mass appears heterogenous, with area of enhancement on LGE imaging. Given patient with suspected malignancy, mass is concerning for metastatic tumor.   Past Medical History:  Diagnosis Date   Anemia    Anxiety    ARF (acute renal failure) ([facility] 06/18/2021   Arthritis    Asthma    Cervical ca (HCC)    H/O: hysterectomy    High cholesterol    Hypertension    Hypothyroidism    Insomnia    Medical history non-contributory    Migraine    [place]    Pancreatic cancer ([facility] 02/2021   Shortness of breath    Stroke ([facility]    Thyroid disease     Past Surgical History:  Procedure Laterality Date   ABDOMINAL HYSTERECTOMY     BIOPSY  02/19/2021   Procedure: BIOPSY;  Surgeon: NMauri Pole MD;  Location: [facility];  Service: Endoscopy;;   BIOPSY  06/19/2021   Procedure: BIOPSY;  Surgeon: BOtis Brace MD;  Location: [facility]OS[facility];  Service: Gastroenterology;;   CESAREAN SECTION     3 occasions   CHOLECYSTECTOMY     ESOPHAGOGASTRODUODENOSCOPY (EGD) WITH PROPOFOL N/A 02/19/2021   Procedure: ESOPHAGOGASTRODUODENOSCOPY (EGD) WITH PROPOFOL;  Surgeon: NMauri Pole MD;  Location: [facility];  Service: Endoscopy;  Laterality: N/A;   ESOPHAGOGASTRODUODENOSCOPY (EGD) WITH PROPOFOL N/A 06/19/2021   Procedure: ESOPHAGOGASTRODUODENOSCOPY (EGD) WITH PROPOFOL;  Surgeon: BOtis Brace MD;  Location: [facility];  Service: Gastroenterology;  Laterality: N/A;   FLEXIBLE SIGMOIDOSCOPY N/A 02/19/2021   Procedure: FLEXIBLE SIGMOIDOSCOPY;  Surgeon: NMauri Pole MD;  Location: [facility];  Service: Endoscopy;  Laterality: N/A;   IR GASTROSTOMY TUBE MOD SED  07/01/2021   IR GASTROSTOMY TUBE REMOVAL  08/21/2021   IR IMAGING GUIDED PORT INSERTION  02/24/2021   IR [place] BX ASP/DRAIN  02/24/2021   THYROIDECTOMY, PARTIAL     Goiter    Current Medications: No outpatient medications have been marked as taking for the 01/13/23 encounter (Appointment) with BJanina Mayo MD.     Allergies:   Blueberry [vaccinium angustifolium], Mango flavor, Oxaliplatin, Reglan [metoclopramide], Aspirin, and Penicillins   Social History   Socioeconomic History   Marital status: Single    Spouse name: Not on file   Number of children: Not on file   Years of education: Not on file   Highest education level: Not on file  Occupational History   Not on file  Tobacco Use   Smoking status: Former    Types: Cigarettes   Smokeless tobacco: Never  Vaping Use   Vaping Use: Never used  Substance and Sexual Activity   Alcohol use: No  Drug use: No   Sexual activity: Not on file  Other Topics Concern   Not on file  Social History Narrative   Not on file   Social Determinants of Health   Financial Resource Strain: Medium Risk (12/14/2022)   Overall Financial Resource Strain (CARDIA)    Difficulty of Paying Living Expenses: Somewhat hard  Food Insecurity: Food Insecurity Present (12/14/2022)   Hunger Vital Sign    Worried About Running Out of Food in the Last Year: Sometimes true    Ran Out of Food in the Last Year: Sometimes true  Transportation Needs: No Transportation Needs (12/14/2022)    PRAPARE - Hydrologist (Medical): No    Lack of Transportation (Non-Medical): No  Physical Activity: Not on file  Stress: Stress Concern Present (12/14/2022)   [place]    Feeling of Stress : To some extent  Social Connections: Socially Isolated (12/14/2022)   Social Connection and Isolation Panel [NHANES]    Frequency of Communication with Friends and Family: More than three times a week    Frequency of Social Gatherings with Friends and Family: More than three times a week    Attends Religious Services: Never    Marine scientist or Organizations: No    Attends Music therapist: Never    Marital Status: Never married     Family History: The patient's ***family history includes Migraines in her sister; Non-Hodgkin's lymphoma in her half-brother and another family member; Pancreatic cancer (age of onset: 26) in an other family member.  ROS:   Please see the history of present illness.    *** All other systems reviewed and are negative.  EKGs/Labs/Other Studies Reviewed:    The following studies were reviewed today: ***  EKG:  EKG is *** ordered today.  The ekg ordered today demonstrates ***  Recent Labs: 01/10/2023: ALT 7; BUN 17; Creatinine 1.08; Hemoglobin 9.8; Platelet Count 336; Potassium 3.6; Sodium 135  Recent Lipid Panel    Component Value Date/Time   CHOL  10/15/2008 0535    137        ATP III CLASSIFICATION:  <200     mg/dL   Desirable  200-239  mg/dL   Borderline High  >=240    mg/dL   High   TRIG 155 (H) 10/15/2008 0535   HDL 35 (L) 10/15/2008 0535   CHOLHDL 3.9 10/15/2008 0535   VLDL 31 10/15/2008 0535   LDLCALC  10/15/2008 0535    71        Total Cholesterol/HDL:CHD Risk Coronary Heart Disease Risk Table                     Men   Women  1/2 Average Risk   3.4   3.3     Risk Assessment/Calculations:   {Does this patient have ATRIAL  FIBRILLATION?:[phone]}            Physical Exam:    VS:  There were no vitals taken for this visit.    Wt Readings from Last 3 Encounters:  01/10/23 195 lb 3.2 oz (88.5 kg)  12/27/22 202 lb 9.6 oz (91.9 kg)  12/14/22 205 lb 8 oz (93.2 kg)     GEN: *** Well nourished, well developed in no acute distress HEENT: Normal NECK: No JVD; No carotid bruits LYMPHATICS: No lymphadenopathy CARDIAC: ***RRR, no murmurs, rubs, gallops RESPIRATORY:  Clear to auscultation without rales,  wheezing or rhonchi  ABDOMEN: Soft, non-tender, non-distended MUSCULOSKELETAL:  No edema; No deformity  SKIN: Warm and dry NEUROLOGIC:  Alert and oriented x 3 PSYCHIATRIC:  Normal affect   ASSESSMENT:    Chest pain: on 5-FU with risk of vasospasm. She has risk of vasospasm. Risk on 5-FU also includes ischemic events, myocarditis and arrhythmia.   PLAN:    In order of problems listed above:  LHC      {Are you ordering a CV Procedure (e.g. stress test, cath, DCCV, TEE, etc)?   Press F2        :[id number]}    Medication Adjustments/Labs and Tests Ordered: Current medicines are reviewed at length with the patient today.  Concerns regarding medicines are outlined above.  No orders of the defined types were placed in this encounter.  No orders of the defined types were placed in this encounter.   There are no Patient Instructions on file for this visit.   Signed, Janina Mayo, MD  01/13/2023 12:47 PM    [place]

## 2023-01-18 ENCOUNTER — Other Ambulatory Visit (HOSPITAL_BASED_OUTPATIENT_CLINIC_OR_DEPARTMENT_OTHER): Payer: Self-pay

## 2023-01-18 ENCOUNTER — Telehealth: Payer: Self-pay | Admitting: *Deleted

## 2023-01-18 MED ORDER — NYSTATIN 100000 UNIT/ML MT SUSP
OROMUCOSAL | 1 refills | Status: DC
  Filled 2023-01-18 – 2023-01-24 (×2): qty 240, 12d supply, fill #0
  Filled 2023-04-22 – 2023-05-11 (×3): qty 240, 12d supply, fill #1

## 2023-01-18 MED ORDER — MAGIC MOUTHWASH: 5.0000 mL | Freq: Four times a day (QID) | ORAL | 1 refills | Status: DC | PRN

## 2023-01-18 MED ORDER — FLUCONAZOLE 100 MG PO TABS: 100.0000 mg | ORAL_TABLET | Freq: Every day | ORAL | 0 refills | Status: DC

## 2023-01-18 NOTE — Telephone Encounter (Signed)
Called to report buccal mouth sores and sores on tongue and inner lip. Also she took last dose of 4 day course of fluconazole for her thrush yesterday and it is better, but not resolved.  Instructed her that MMW will be called in, also rinse with 1 cup warm water w/ 1/4 tsp baking soda and 1/8 tsp salt several times/day. Can also apply Oragel to inside her lip. MD said to extend fluconazole for 4 more days. Had to call script to [facility] and [facility] do not compound.

## 2023-01-19 ENCOUNTER — Other Ambulatory Visit (HOSPITAL_BASED_OUTPATIENT_CLINIC_OR_DEPARTMENT_OTHER): Payer: Self-pay

## 2023-01-20 ENCOUNTER — Other Ambulatory Visit (HOSPITAL_BASED_OUTPATIENT_CLINIC_OR_DEPARTMENT_OTHER): Payer: Self-pay

## 2023-01-21 ENCOUNTER — Other Ambulatory Visit (HOSPITAL_BASED_OUTPATIENT_CLINIC_OR_DEPARTMENT_OTHER): Payer: Self-pay

## 2023-01-23 ENCOUNTER — Other Ambulatory Visit: Payer: Self-pay | Admitting: Oncology

## 2023-01-24 ENCOUNTER — Other Ambulatory Visit: Payer: Self-pay | Admitting: *Deleted

## 2023-01-24 ENCOUNTER — Encounter: Payer: Self-pay | Admitting: *Deleted

## 2023-01-24 ENCOUNTER — Inpatient Hospital Stay (HOSPITAL_BASED_OUTPATIENT_CLINIC_OR_DEPARTMENT_OTHER): Payer: Medicaid Other | Admitting: Oncology

## 2023-01-24 ENCOUNTER — Other Ambulatory Visit (HOSPITAL_BASED_OUTPATIENT_CLINIC_OR_DEPARTMENT_OTHER): Payer: Self-pay

## 2023-01-24 ENCOUNTER — Inpatient Hospital Stay: Payer: Medicaid Other

## 2023-01-24 ENCOUNTER — Encounter: Payer: Self-pay | Admitting: Oncology

## 2023-01-24 ENCOUNTER — Inpatient Hospital Stay: Payer: Medicaid Other | Attending: Nurse Practitioner

## 2023-01-24 VITALS — BP 117/74 | HR 74 | Temp 97.5°F | Resp 20

## 2023-01-24 VITALS — BP 111/79 | HR 85 | Temp 98.2°F | Resp 18 | Ht 64.0 in | Wt 182.0 lb

## 2023-01-24 DIAGNOSIS — R197 Diarrhea, unspecified: Secondary | ICD-10-CM | POA: Insufficient documentation

## 2023-01-24 DIAGNOSIS — Z5189 Encounter for other specified aftercare: Secondary | ICD-10-CM | POA: Insufficient documentation

## 2023-01-24 DIAGNOSIS — C787 Secondary malignant neoplasm of liver and intrahepatic bile duct: Secondary | ICD-10-CM | POA: Insufficient documentation

## 2023-01-24 DIAGNOSIS — D509 Iron deficiency anemia, unspecified: Secondary | ICD-10-CM | POA: Diagnosis not present

## 2023-01-24 DIAGNOSIS — K1230 Oral mucositis (ulcerative), unspecified: Secondary | ICD-10-CM | POA: Diagnosis not present

## 2023-01-24 DIAGNOSIS — G43909 Migraine, unspecified, not intractable, without status migrainosus: Secondary | ICD-10-CM | POA: Diagnosis not present

## 2023-01-24 DIAGNOSIS — C250 Malignant neoplasm of head of pancreas: Secondary | ICD-10-CM | POA: Insufficient documentation

## 2023-01-24 DIAGNOSIS — E860 Dehydration: Secondary | ICD-10-CM | POA: Insufficient documentation

## 2023-01-24 DIAGNOSIS — I10 Essential (primary) hypertension: Secondary | ICD-10-CM | POA: Diagnosis not present

## 2023-01-24 DIAGNOSIS — J45909 Unspecified asthma, uncomplicated: Secondary | ICD-10-CM | POA: Insufficient documentation

## 2023-01-24 DIAGNOSIS — C259 Malignant neoplasm of pancreas, unspecified: Secondary | ICD-10-CM

## 2023-01-24 DIAGNOSIS — Z5111 Encounter for antineoplastic chemotherapy: Secondary | ICD-10-CM | POA: Diagnosis present

## 2023-01-24 DIAGNOSIS — E46 Unspecified protein-calorie malnutrition: Secondary | ICD-10-CM | POA: Diagnosis not present

## 2023-01-24 DIAGNOSIS — E876 Hypokalemia: Secondary | ICD-10-CM | POA: Insufficient documentation

## 2023-01-24 DIAGNOSIS — Z8673 Personal history of transient ischemic attack (TIA), and cerebral infarction without residual deficits: Secondary | ICD-10-CM | POA: Insufficient documentation

## 2023-01-24 DIAGNOSIS — E785 Hyperlipidemia, unspecified: Secondary | ICD-10-CM | POA: Insufficient documentation

## 2023-01-24 DIAGNOSIS — R112 Nausea with vomiting, unspecified: Secondary | ICD-10-CM | POA: Insufficient documentation

## 2023-01-24 DIAGNOSIS — E039 Hypothyroidism, unspecified: Secondary | ICD-10-CM | POA: Diagnosis not present

## 2023-01-24 LAB — CMP (CANCER CENTER ONLY)
ALT: 8 U/L (ref 0–44)
AST: 17 U/L (ref 15–41)
Albumin: 3.2 g/dL — ABNORMAL LOW (ref 3.5–5.0)
Alkaline Phosphatase: 158 U/L — ABNORMAL HIGH (ref 38–126)
Anion gap: 8 (ref 5–15)
BUN: 12 mg/dL (ref 6–20)
CO2: 30 mmol/L (ref 22–32)
Calcium: 9.2 mg/dL (ref 8.9–10.3)
Chloride: 99 mmol/L (ref 98–111)
Creatinine: 1.14 mg/dL — ABNORMAL HIGH (ref 0.44–1.00)
GFR, Estimated: 57 mL/min — ABNORMAL LOW (ref >60)
Glucose, Bld: 86 mg/dL (ref 70–99)
Potassium: 3.7 mmol/L (ref 3.5–5.1)
Sodium: 137 mmol/L (ref 135–145)
Total Bilirubin: 0.5 mg/dL (ref 0.3–1.2)
Total Protein: 6.5 g/dL (ref 6.5–8.1)

## 2023-01-24 LAB — CBC WITH DIFFERENTIAL (CANCER CENTER ONLY)
Abs Immature Granulocytes: 0.2 10*3/uL — ABNORMAL HIGH (ref 0.00–0.07)
Band Neutrophils: 6 %
Basophils Absolute: 0 10*3/uL (ref 0.0–0.1)
Basophils Relative: 0 %
Eosinophils Absolute: 0.3 10*3/uL (ref 0.0–0.5)
Eosinophils Relative: 2 %
HCT: 30.9 % — ABNORMAL LOW (ref 36.0–46.0)
Hemoglobin: 10 g/dL — ABNORMAL LOW (ref 12.0–15.0)
Lymphocytes Relative: 8 %
Lymphs Abs: 1.3 10*3/uL (ref 0.7–4.0)
MCH: 28.1 pg (ref 26.0–34.0)
MCHC: 32.4 g/dL (ref 30.0–36.0)
MCV: 86.8 fL (ref 80.0–100.0)
Metamyelocytes Relative: 1 %
Monocytes Absolute: 0.3 10*3/uL (ref 0.1–1.0)
Monocytes Relative: 2 %
Neutro Abs: 14.6 10*3/uL — ABNORMAL HIGH (ref 1.7–7.7)
Neutrophils Relative %: 81 %
Platelet Count: 253 10*3/uL (ref 150–400)
RBC: 3.56 MIL/uL — ABNORMAL LOW (ref 3.87–5.11)
RDW: 20.4 % — ABNORMAL HIGH (ref 11.5–15.5)
WBC Count: 16.8 10*3/uL — ABNORMAL HIGH (ref 4.0–10.5)
nRBC: 0.1 % (ref 0.0–0.2)

## 2023-01-24 MED ORDER — METHYLPREDNISOLONE SODIUM SUCC 125 MG IJ SOLR
125.0000 mg | Freq: Once | INTRAMUSCULAR | Status: AC | PRN
  Administered 2023-01-24: 125 mg via INTRAVENOUS

## 2023-01-24 MED ORDER — ATROPINE SULFATE 1 MG/ML IV SOLN
0.5000 mg | Freq: Once | INTRAVENOUS | Status: AC | PRN
  Administered 2023-01-24: 0.5 mg via INTRAVENOUS
  Filled 2023-01-24: qty 1

## 2023-01-24 MED ORDER — FAMOTIDINE IN NACL 20-0.9 MG/50ML-% IV SOLN
20.0000 mg | Freq: Once | INTRAVENOUS | Status: AC
  Administered 2023-01-24: 20 mg via INTRAVENOUS
  Filled 2023-01-24: qty 50

## 2023-01-24 MED ORDER — SODIUM CHLORIDE 0.9 % IV SOLN
150.0000 mg/m2 | Freq: Once | INTRAVENOUS | Status: AC
  Administered 2023-01-24: 300 mg via INTRAVENOUS
  Filled 2023-01-24: qty 15

## 2023-01-24 MED ORDER — DEXTROSE 5 % IV SOLN: Freq: Once | INTRAVENOUS | Status: AC

## 2023-01-24 MED ORDER — SODIUM CHLORIDE 0.9 % IV SOLN
150.0000 mg | Freq: Once | INTRAVENOUS | Status: AC
  Administered 2023-01-24: 150 mg via INTRAVENOUS
  Filled 2023-01-24: qty 150

## 2023-01-24 MED ORDER — HEPARIN SOD (PORK) LOCK FLUSH 100 UNIT/ML IV SOLN: 250.0000 [IU] | Freq: Once | INTRAVENOUS | Status: DC | PRN

## 2023-01-24 MED ORDER — DIPHENHYDRAMINE HCL 50 MG/ML IJ SOLN
25.0000 mg | Freq: Once | INTRAMUSCULAR | Status: AC
  Administered 2023-01-24: 25 mg via INTRAVENOUS

## 2023-01-24 MED ORDER — SODIUM CHLORIDE 0.9 % IV SOLN: Freq: Once | INTRAVENOUS | Status: AC

## 2023-01-24 MED ORDER — FLUCONAZOLE 200 MG PO TABS
200.0000 mg | ORAL_TABLET | Freq: Every day | ORAL | 0 refills | Status: DC
  Filled 2023-01-24: qty 7, 7d supply, fill #0

## 2023-01-24 MED ORDER — SODIUM CHLORIDE 0.9 % IV SOLN: Freq: Once | INTRAVENOUS | Status: DC | PRN

## 2023-01-24 MED ORDER — DIPHENHYDRAMINE HCL 50 MG/ML IJ SOLN
50.0000 mg | Freq: Once | INTRAMUSCULAR | Status: AC | PRN
  Administered 2023-01-24: 25 mg via INTRAVENOUS

## 2023-01-24 MED ORDER — SODIUM CHLORIDE 0.9 % IV SOLN
1400.0000 mg/m2 | INTRAVENOUS | Status: DC
  Administered 2023-01-24: 2500 mg via INTRAVENOUS
  Filled 2023-01-24: qty 50

## 2023-01-24 MED ORDER — HEPARIN SOD (PORK) LOCK FLUSH 100 UNIT/ML IV SOLN: 500.0000 [IU] | Freq: Once | INTRAVENOUS | Status: DC | PRN

## 2023-01-24 MED ORDER — SODIUM CHLORIDE 0.9 % IV SOLN
10.0000 mg | Freq: Once | INTRAVENOUS | Status: AC
  Administered 2023-01-24: 10 mg via INTRAVENOUS
  Filled 2023-01-24: qty 10

## 2023-01-24 MED ORDER — PALONOSETRON HCL INJECTION 0.25 MG/5ML
0.2500 mg | Freq: Once | INTRAVENOUS | Status: AC
  Administered 2023-01-24: 0.25 mg via INTRAVENOUS
  Filled 2023-01-24: qty 5

## 2023-01-24 MED ORDER — SODIUM CHLORIDE 0.9 % IV SOLN
400.0000 mg/m2 | Freq: Once | INTRAVENOUS | Status: AC
  Administered 2023-01-24: 772 mg via INTRAVENOUS
  Filled 2023-01-24: qty 38.6

## 2023-01-24 MED ORDER — OXYCODONE HCL 10 MG PO TABS
10.0000 mg | ORAL_TABLET | ORAL | 0 refills | Status: DC | PRN
  Filled 2023-01-24: qty 60, 5d supply, fill #0
  Filled 2023-02-28: qty 40, 4d supply, fill #1

## 2023-01-24 MED ORDER — SODIUM CHLORIDE 0.9% FLUSH
10.0000 mL | INTRAVENOUS | Status: DC | PRN
  Administered 2023-01-24: 10 mL

## 2023-01-24 MED ORDER — OXALIPLATIN CHEMO INJECTION 100 MG/20ML
65.0000 mg/m2 | Freq: Once | INTRAVENOUS | Status: AC
  Administered 2023-01-24: 125 mg via INTRAVENOUS
  Filled 2023-01-24: qty 20

## 2023-01-24 MED ORDER — ALTEPLASE 2 MG IJ SOLR: 2.0000 mg | Freq: Once | INTRAMUSCULAR | Status: DC | PRN

## 2023-01-24 MED ORDER — SODIUM CHLORIDE 0.9% FLUSH: 3.0000 mL | INTRAVENOUS | Status: DC | PRN

## 2023-01-24 NOTE — Progress Notes (Signed)
Hypersensitivity Reaction note  Date of event: 01/24/23 Time of event: 1144 Generic name of drug involved: oxaliplatin Name of provider notified of the hypersensitivity reaction: Ned Card, NP Was agent that likely caused hypersensitivity reaction added to Allergies List within EMR? Already listed; updated to add today's event.  Chain of events including reaction signs/symptoms, treatment administered, and outcome (e.g., drug resumed; drug discontinued; sent to Emergency Department; etc.)  Patient started itching her chest and stated she felt itchy all of a sudden. Oxaliplatin stopped and IVF started. IV benadryl given (see MAR). Ned Card, NP notified and came to see patient in infusion room.  Redness was noted on chest where patient was itching. No redness was found on other sites.  Patient stated itchiness had started to move to her neck, cheeks, and palms.  IV Solumedrol was then given (see MAR).  1255: Per Ned Card, NP and Dr. Benay Spice, ok to restart Oxaliplatin at lower rate (see MAR).   1330: Patient started complaining of itching on arms and chest.  Oxaliplatin stopped and Dr. Benay Spice made aware. Another dose of Benadryl given IV (see MAR). Per Dr. Benay Spice, oxaliplatin will NOT be rechallenged today, but ok given to proceed with Folfiri once itching subsided.  Patient made aware and verbalized understanding.  Patient able to complete remainder of infusion without any further complaints.   Teodoro Spray, RN 01/24/2023 12:48 PM

## 2023-01-24 NOTE — Progress Notes (Signed)
Provided Wendy Hoffman case of vanilla Ensure. Will arrange for appointment w/dietician.

## 2023-01-24 NOTE — Patient Instructions (Addendum)
[facility]   Discharge Instructions: Thank you for choosing [facility] to provide your oncology and hematology care.   If you have a lab appointment with the [facility], please go directly to the [facility] and check in at the registration area.   Wear comfortable clothing and clothing appropriate for easy access to any Portacath or PICC line.   We strive to give you quality time with your provider. You may need to reschedule your appointment if you arrive late (15 or more minutes).  Arriving late affects you and other patients whose appointments are after yours.  Also, if you miss three or more appointments without notifying the office, you may be dismissed from the clinic at the provider's discretion.      For prescription refill requests, have your pharmacy contact our office and allow 72 hours for refills to be completed.    Today you received the following chemotherapy and/or immunotherapy agents Oxaliplatin (ELOXATIN), Irinotecan (CAMPTOSAR), Leucovorin & Flourouracil (ADRUCIL).       To help prevent nausea and vomiting after your treatment, we encourage you to take your nausea medication as directed.  BELOW ARE SYMPTOMS THAT SHOULD BE REPORTED IMMEDIATELY: *FEVER GREATER THAN 100.4 F (38 C) OR HIGHER *CHILLS OR SWEATING *NAUSEA AND VOMITING THAT IS NOT CONTROLLED WITH YOUR NAUSEA MEDICATION *UNUSUAL SHORTNESS OF BREATH *UNUSUAL BRUISING OR BLEEDING *URINARY PROBLEMS (pain or burning when urinating, or frequent urination) *BOWEL PROBLEMS (unusual diarrhea, constipation, pain near the anus) TENDERNESS IN MOUTH AND THROAT WITH OR WITHOUT PRESENCE OF ULCERS (sore throat, sores in mouth, or a toothache) UNUSUAL RASH, SWELLING OR PAIN  UNUSUAL VAGINAL DISCHARGE OR ITCHING   Items with * indicate a potential emergency and should be followed up as soon as possible or go to the Emergency Department if any problems should  occur.  Please show the CHEMOTHERAPY ALERT CARD or IMMUNOTHERAPY ALERT CARD at check-in to the Emergency Department and triage nurse.  Should you have questions after your visit or need to cancel or reschedule your appointment, please contact [facility]  Dept: [phone]  and follow the prompts.  Office hours are 8:00 a.m. to 4:30 p.m. Monday - Friday. Please note that voicemails left after 4:00 p.m. may not be returned until the following business day.  We are closed weekends and major holidays. You have access to a nurse at all times for urgent questions. Please call the main number to the clinic Dept: [phone] and follow the prompts.   For any non-urgent questions, you may also contact your provider using MyChart. We now offer e-Visits for anyone 69 and older to request care online for non-urgent symptoms. For details visit [email or web address].   Also download the MyChart app! Go to the app store, search "MyChart", open the app, select [facility], and log in with your MyChart username and password.  Oxaliplatin Injection What is this medication? OXALIPLATIN (ox AL i PLA tin) treats some types of cancer. It works by slowing down the growth of cancer cells. This medicine may be used for other purposes; ask your health care provider or pharmacist if you have questions. COMMON BRAND NAME(S): Eloxatin What should I tell my care team before I take this medication? They need to know if you have any of these conditions: Heart disease History of irregular heartbeat or rhythm Liver disease Low blood cell levels (white cells, red cells, and platelets) Lung or breathing disease, such as  asthma Take medications that treat or prevent blood clots Tingling of the fingers, toes, or other nerve disorder An unusual or allergic reaction to oxaliplatin, other medications, foods, dyes, or preservatives If you or your partner are pregnant or trying to get  pregnant Breast-feeding How should I use this medication? This medication is injected into a vein. It is given by your care team in a hospital or clinic setting. Talk to your care team about the use of this medication in children. Special care may be needed. Overdosage: If you think you have taken too much of this medicine contact a poison control center or emergency room at once. NOTE: This medicine is only for you. Do not share this medicine with others. What if I miss a dose? Keep appointments for follow-up doses. It is important not to miss a dose. Call your care team if you are unable to keep an appointment. What may interact with this medication? Do not take this medication with any of the following: Cisapride Dronedarone Pimozide Thioridazine This medication may also interact with the following: Aspirin and aspirin-like medications Certain medications that treat or prevent blood clots, such as warfarin, apixaban, dabigatran, and rivaroxaban Cisplatin Cyclosporine Diuretics Medications for infection, such as acyclovir, adefovir, amphotericin B, bacitracin, cidofovir, foscarnet, ganciclovir, gentamicin, pentamidine, vancomycin NSAIDs, medications for pain and inflammation, such as ibuprofen or naproxen Other medications that cause heart rhythm changes Pamidronate Zoledronic acid This list may not describe all possible interactions. Give your health care provider a list of all the medicines, herbs, non-prescription drugs, or dietary supplements you use. Also tell them if you smoke, drink alcohol, or use illegal drugs. Some items may interact with your medicine. What should I watch for while using this medication? Your condition will be monitored carefully while you are receiving this medication. You may need blood work while taking this medication. This medication may make you feel generally unwell. This is not uncommon as chemotherapy can affect healthy cells as well as cancer  cells. Report any side effects. Continue your course of treatment even though you feel ill unless your care team tells you to stop. This medication may increase your risk of getting an infection. Call your care team for advice if you get a fever, chills, sore throat, or other symptoms of a cold or flu. Do not treat yourself. Try to avoid being around people who are sick. Avoid taking medications that contain aspirin, acetaminophen, ibuprofen, naproxen, or ketoprofen unless instructed by your care team. These medications may hide a fever. Be careful brushing or flossing your teeth or using a toothpick because you may get an infection or bleed more easily. If you have any dental work done, tell your dentist you are receiving this medication. This medication can make you more sensitive to cold. Do not drink cold drinks or use ice. Cover exposed skin before coming in contact with cold temperatures or cold objects. When out in cold weather wear warm clothing and cover your mouth and nose to warm the air that goes into your lungs. Tell your care team if you get sensitive to the cold. Talk to your care team if you or your partner are pregnant or think either of you might be pregnant. This medication can cause serious birth defects if taken during pregnancy and for 9 months after the last dose. A negative pregnancy test is required before starting this medication. A reliable form of contraception is recommended while taking this medication and for 9 months after the last  dose. Talk to your care team about effective forms of contraception. Do not father a child while taking this medication and for 6 months after the last dose. Use a condom while having sex during this time period. Do not breastfeed while taking this medication and for 3 months after the last dose. This medication may cause infertility. Talk to your care team if you are concerned about your fertility. What side effects may I notice from receiving this  medication? Side effects that you should report to your care team as soon as possible: Allergic reactions--skin rash, itching, hives, swelling of the face, lips, tongue, or throat Bleeding--bloody or black, tar-like stools, vomiting blood or brown material that looks like coffee grounds, red or dark brown urine, small red or purple spots on skin, unusual bruising or bleeding Dry cough, shortness of breath or trouble breathing Heart rhythm changes--fast or irregular heartbeat, dizziness, feeling faint or lightheaded, chest pain, trouble breathing Infection--fever, chills, cough, sore throat, wounds that don't heal, pain or trouble when passing urine, general feeling of discomfort or being unwell Liver injury--right upper belly pain, loss of appetite, nausea, light-colored stool, dark yellow or brown urine, yellowing skin or eyes, unusual weakness or fatigue Low red blood cell level--unusual weakness or fatigue, dizziness, headache, trouble breathing Muscle injury--unusual weakness or fatigue, muscle pain, dark yellow or brown urine, decrease in amount of urine Pain, tingling, or numbness in the hands or feet Sudden and severe headache, confusion, change in vision, seizures, which may be signs of posterior reversible encephalopathy syndrome (PRES) Unusual bruising or bleeding Side effects that usually do not require medical attention (report to your care team if they continue or are bothersome): Diarrhea Nausea Pain, redness, or swelling with sores inside the mouth or throat Unusual weakness or fatigue Vomiting This list may not describe all possible side effects. Call your doctor for medical advice about side effects. You may report side effects to FDA at 1-800-FDA-1088. Where should I keep my medication? This medication is given in a hospital or clinic. It will not be stored at home. NOTE: This sheet is a summary. It may not cover all possible information. If you have questions about this  medicine, talk to your doctor, pharmacist, or health care provider.  2023 Elsevier/Gold Standard (2008-01-20 00:00:00)  Irinotecan Injection What is this medication? IRINOTECAN (ir in oh TEE kan) treats some types of cancer. It works by slowing down the growth of cancer cells. This medicine may be used for other purposes; ask your health care provider or pharmacist if you have questions. COMMON BRAND NAME(S): Camptosar What should I tell my care team before I take this medication? They need to know if you have any of these conditions: Dehydration Diarrhea Infection, especially a viral infection, such as chickenpox, cold sores, herpes Liver disease Low blood cell levels (white cells, red cells, and platelets) Low levels of electrolytes, such as calcium, magnesium, or potassium in your blood Recent or ongoing radiation An unusual or allergic reaction to irinotecan, other medications, foods, dyes, or preservatives If you or your partner are pregnant or trying to get pregnant Breast-feeding How should I use this medication? This medication is injected into a vein. It is given by your care team in a hospital or clinic setting. Talk to your care team about the use of this medication in children. Special care may be needed. Overdosage: If you think you have taken too much of this medicine contact a poison control center or emergency room at once.  NOTE: This medicine is only for you. Do not share this medicine with others. What if I miss a dose? Keep appointments for follow-up doses. It is important not to miss your dose. Call your care team if you are unable to keep an appointment. What may interact with this medication? Do not take this medication with any of the following: Cobicistat Itraconazole This medication may also interact with the following: Certain antibiotics, such as clarithromycin, rifampin, rifabutin Certain antivirals for HIV or AIDS Certain medications for fungal  infections, such as ketoconazole, posaconazole, voriconazole Certain medications for seizures, such as carbamazepine, phenobarbital, phenytoin Gemfibrozil Nefazodone St. John's wort This list may not describe all possible interactions. Give your health care provider a list of all the medicines, herbs, non-prescription drugs, or dietary supplements you use. Also tell them if you smoke, drink alcohol, or use illegal drugs. Some items may interact with your medicine. What should I watch for while using this medication? Your condition will be monitored carefully while you are receiving this medication. You may need blood work while taking this medication. This medication may make you feel generally unwell. This is not uncommon as chemotherapy can affect healthy cells as well as cancer cells. Report any side effects. Continue your course of treatment even though you feel ill unless your care team tells you to stop. This medication can cause serious side effects. To reduce the risk, your care team may give you other medications to take before receiving this one. Be sure to follow the directions from your care team. This medication may affect your coordination, reaction time, or judgement. Do not drive or operate machinery until you know how this medication affects you. Sit up or stand slowly to reduce the risk of dizzy or fainting spells. Drinking alcohol with this medication can increase the risk of these side effects. This medication may increase your risk of getting an infection. Call your care team for advice if you get a fever, chills, sore throat, or other symptoms of a cold or flu. Do not treat yourself. Try to avoid being around people who are sick. Avoid taking medications that contain aspirin, acetaminophen, ibuprofen, naproxen, or ketoprofen unless instructed by your care team. These medications may hide a fever. This medication may increase your risk to bruise or bleed. Call your care team if you  notice any unusual bleeding. Be careful brushing or flossing your teeth or using a toothpick because you may get an infection or bleed more easily. If you have any dental work done, tell your dentist you are receiving this medication. Talk to your care team if you or your partner are pregnant or think either of you might be pregnant. This medication can cause serious birth defects if taken during pregnancy and for 6 months after the last dose. You will need a negative pregnancy test before starting this medication. Contraception is recommended while taking this medication and for 6 months after the last dose. Your care team can help you find the option that works for you. Do not father a child while taking this medication and for 3 months after the last dose. Use a condom for contraception during this time period. Do not breastfeed while taking this medication and for 7 days after the last dose. This medication may cause infertility. Talk to your care team if you are concerned about your fertility. What side effects may I notice from receiving this medication? Side effects that you should report to your care team as soon as  possible: Allergic reactions--skin rash, itching, hives, swelling of the face, lips, tongue, or throat Dry cough, shortness of breath or trouble breathing Increased saliva or tears, increased sweating, stomach cramping, diarrhea, small pupils, unusual weakness or fatigue, slow heartbeat Infection--fever, chills, cough, sore throat, wounds that don't heal, pain or trouble when passing urine, general feeling of discomfort or being unwell Kidney injury--decrease in the amount of urine, swelling of the ankles, hands, or feet Low red blood cell level--unusual weakness or fatigue, dizziness, headache, trouble breathing Severe or prolonged diarrhea Unusual bruising or bleeding Side effects that usually do not require medical attention (report to your care team if they continue or are  bothersome): Constipation Diarrhea Hair loss Loss of appetite Nausea Stomach pain This list may not describe all possible side effects. Call your doctor for medical advice about side effects. You may report side effects to FDA at 1-800-FDA-1088. Where should I keep my medication? This medication is given in a hospital or clinic. It will not be stored at home. NOTE: This sheet is a summary. It may not cover all possible information. If you have questions about this medicine, talk to your doctor, pharmacist, or health care provider.  2023 Elsevier/Gold Standard (2022-04-08 00:00:00)  Leucovorin Injection What is this medication? LEUCOVORIN (loo koe VOR in) prevents side effects from certain medications, such as methotrexate. It works by increasing folate levels. This helps protect healthy cells in your body. It may also be used to treat anemia caused by low levels of folate. It can also be used with fluorouracil, a type of chemotherapy, to treat colorectal cancer. It works by increasing the effects of fluorouracil in the body. This medicine may be used for other purposes; ask your health care provider or pharmacist if you have questions. What should I tell my care team before I take this medication? They need to know if you have any of these conditions: Anemia from low levels of vitamin B12 in the blood An unusual or allergic reaction to leucovorin, folic acid, other medications, foods, dyes, or preservatives Pregnant or trying to get pregnant Breastfeeding How should I use this medication? This medication is injected into a vein or a muscle. It is given by your care team in a hospital or clinic setting. Talk to your care team about the use of this medication in children. Special care may be needed. Overdosage: If you think you have taken too much of this medicine contact a poison control center or emergency room at once. NOTE: This medicine is only for you. Do not share this medicine with  others. What if I miss a dose? Keep appointments for follow-up doses. It is important not to miss your dose. Call your care team if you are unable to keep an appointment. What may interact with this medication? Capecitabine Fluorouracil Phenobarbital Phenytoin Primidone Trimethoprim;sulfamethoxazole This list may not describe all possible interactions. Give your health care provider a list of all the medicines, herbs, non-prescription drugs, or dietary supplements you use. Also tell them if you smoke, drink alcohol, or use illegal drugs. Some items may interact with your medicine. What should I watch for while using this medication? Your condition will be monitored carefully while you are receiving this medication. This medication may increase the side effects of 5-fluorouracil. Tell your care team if you have diarrhea or mouth sores that do not get better or that get worse. What side effects may I notice from receiving this medication? Side effects that you should report to your  care team as soon as possible: Allergic reactions--skin rash, itching, hives, swelling of the face, lips, tongue, or throat This list may not describe all possible side effects. Call your doctor for medical advice about side effects. You may report side effects to FDA at 1-800-FDA-1088. Where should I keep my medication? This medication is given in a hospital or clinic. It will not be stored at home. NOTE: This sheet is a summary. It may not cover all possible information. If you have questions about this medicine, talk to your doctor, pharmacist, or health care provider.  2023 Elsevier/Gold Standard (2022-05-04 00:00:00)  Fluorouracil Injection What is this medication? FLUOROURACIL (flure oh YOOR a sil) treats some types of cancer. It works by slowing down the growth of cancer cells. This medicine may be used for other purposes; ask your health care provider or pharmacist if you have questions. COMMON BRAND  NAME(S): Adrucil What should I tell my care team before I take this medication? They need to know if you have any of these conditions: Blood disorders Dihydropyrimidine dehydrogenase (DPD) deficiency Infection, such as chickenpox, cold sores, herpes Kidney disease Liver disease Poor nutrition Recent or ongoing radiation therapy An unusual or allergic reaction to fluorouracil, other medications, foods, dyes, or preservatives If you or your partner are pregnant or trying to get pregnant Breast-feeding How should I use this medication? This medication is injected into a vein. It is administered by your care team in a hospital or clinic setting. Talk to your care team about the use of this medication in children. Special care may be needed. Overdosage: If you think you have taken too much of this medicine contact a poison control center or emergency room at once. NOTE: This medicine is only for you. Do not share this medicine with others. What if I miss a dose? Keep appointments for follow-up doses. It is important not to miss your dose. Call your care team if you are unable to keep an appointment. What may interact with this medication? Do not take this medication with any of the following: Live virus vaccines This medication may also interact with the following: Medications that treat or prevent blood clots, such as warfarin, enoxaparin, dalteparin This list may not describe all possible interactions. Give your health care provider a list of all the medicines, herbs, non-prescription drugs, or dietary supplements you use. Also tell them if you smoke, drink alcohol, or use illegal drugs. Some items may interact with your medicine. What should I watch for while using this medication? Your condition will be monitored carefully while you are receiving this medication. This medication may make you feel generally unwell. This is not uncommon as chemotherapy can affect healthy cells as well as  cancer cells. Report any side effects. Continue your course of treatment even though you feel ill unless your care team tells you to stop. In some cases, you may be given additional medications to help with side effects. Follow all directions for their use. This medication may increase your risk of getting an infection. Call your care team for advice if you get a fever, chills, sore throat, or other symptoms of a cold or flu. Do not treat yourself. Try to avoid being around people who are sick. This medication may increase your risk to bruise or bleed. Call your care team if you notice any unusual bleeding. Be careful brushing or flossing your teeth or using a toothpick because you may get an infection or bleed more easily. If you have  any dental work done, tell your dentist you are receiving this medication. Avoid taking medications that contain aspirin, acetaminophen, ibuprofen, naproxen, or ketoprofen unless instructed by your care team. These medications may hide a fever. Do not treat diarrhea with over the counter products. Contact your care team if you have diarrhea that lasts more than 2 days or if it is severe and watery. This medication can make you more sensitive to the sun. Keep out of the sun. If you cannot avoid being in the sun, wear protective clothing and sunscreen. Do not use sun lamps, tanning beds, or tanning booths. Talk to your care team if you or your partner wish to become pregnant or think you might be pregnant. This medication can cause serious birth defects if taken during pregnancy and for 3 months after the last dose. A reliable form of contraception is recommended while taking this medication and for 3 months after the last dose. Talk to your care team about effective forms of contraception. Do not father a child while taking this medication and for 3 months after the last dose. Use a condom while having sex during this time period. Do not breastfeed while taking this  medication. This medication may cause infertility. Talk to your care team if you are concerned about your fertility. What side effects may I notice from receiving this medication? Side effects that you should report to your care team as soon as possible: Allergic reactions--skin rash, itching, hives, swelling of the face, lips, tongue, or throat Heart attack--pain or tightness in the chest, shoulders, arms, or jaw, nausea, shortness of breath, cold or clammy skin, feeling faint or lightheaded Heart failure--shortness of breath, swelling of the ankles, feet, or hands, sudden weight gain, unusual weakness or fatigue Heart rhythm changes--fast or irregular heartbeat, dizziness, feeling faint or lightheaded, chest pain, trouble breathing High ammonia level--unusual weakness or fatigue, confusion, loss of appetite, nausea, vomiting, seizures Infection--fever, chills, cough, sore throat, wounds that don't heal, pain or trouble when passing urine, general feeling of discomfort or being unwell Low red blood cell level--unusual weakness or fatigue, dizziness, headache, trouble breathing Pain, tingling, or numbness in the hands or feet, muscle weakness, change in vision, confusion or trouble speaking, loss of balance or coordination, trouble walking, seizures Redness, swelling, and blistering of the skin over hands and feet Severe or prolonged diarrhea Unusual bruising or bleeding Side effects that usually do not require medical attention (report to your care team if they continue or are bothersome): Dry skin Headache Increased tears Nausea Pain, redness, or swelling with sores inside the mouth or throat Sensitivity to light Vomiting This list may not describe all possible side effects. Call your doctor for medical advice about side effects. You may report side effects to FDA at 1-800-FDA-1088. Where should I keep my medication? This medication is given in a hospital or clinic. It will not be stored  at home. NOTE: This sheet is a summary. It may not cover all possible information. If you have questions about this medicine, talk to your doctor, pharmacist, or health care provider.  2023 Elsevier/Gold Standard (2022-03-30 00:00:00)  The chemotherapy medication bag should finish at 46 hours, 96 hours, or 7 days. For example, if your pump is scheduled for 46 hours and it was put on at 4:00 p.m., it should finish at 2:00 p.m. the day it is scheduled to come off regardless of your appointment time.     Estimated time to finish at 1:45 p.m. on Wednesday 02/05/2023.  If the display on your pump reads "Low Volume" and it is beeping, take the batteries out of the pump and come to the cancer center for it to be taken off.   If the pump alarms go off prior to the pump reading "Low Volume" then call [phone] and someone can assist you.  If the plunger comes out and the chemotherapy medication is leaking out, please use your home chemo spill kit to clean up the spill. Do NOT use paper towels or other household products.  If you have problems or questions regarding your pump, please call either [phone] (24 hours a day) or the cancer center Monday-Friday 8:00 a.m.- 4:30 p.m. at the clinic number and we will assist you. If you are unable to get assistance, then go to the nearest Emergency Department and ask the staff to contact the IV team for assistance.

## 2023-01-24 NOTE — Progress Notes (Addendum)
Wendy Hoffman OFFICE PROGRESS NOTE   Diagnosis: Pancreas cancer  INTERVAL HISTORY:  Wendy Hoffman completed a cycle of FOLFIRINOX on 01/10/2023.  No symptom of an allergic reaction.  She reports mouth sores at the lips following chemotherapy.  She also has thrush despite Diflucan.  Her appetite is poor.  She ran out of boost and Ensure.  She has intermittent abdominal pain.  Mild worsening of balance following chemotherapy.  Objective:  Vital signs in last 24 hours:  Blood pressure 111/79, pulse 85, temperature 98.2 F (36.8 C), temperature source Oral, resp. rate 18, height 5' 4"$  (1.626 m), weight 182 lb (82.6 kg), SpO2 92 %.    HEENT: Thrush at the pharynx, tongue, and buccal mucosa, no ulcers Resp: Decreased breath sounds at the right posterior chest, no respiratory distress Cardio: Regular rate and rhythm GI: Tender throughout the upper abdomen, no mass Vascular: No leg edema    Portacath/PICC-without erythema  Lab Results:  Lab Results  Component Value Date   WBC 16.8 (H) 01/24/2023   HGB 10.0 (L) 01/24/2023   HCT 30.9 (L) 01/24/2023   MCV 86.8 01/24/2023   PLT 253 01/24/2023   NEUTROABS PENDING 01/24/2023    CMP  Lab Results  Component Value Date   NA 135 01/10/2023   K 3.6 01/10/2023   CL 94 (L) 01/10/2023   CO2 32 01/10/2023   GLUCOSE 99 01/10/2023   BUN 17 01/10/2023   CREATININE 1.08 (H) 01/10/2023   CALCIUM 9.4 01/10/2023   PROT 7.1 01/10/2023   ALBUMIN 3.0 (L) 01/10/2023   AST 21 01/10/2023   ALT 7 01/10/2023   ALKPHOS 173 (H) 01/10/2023   BILITOT 1.0 01/10/2023   GFRNONAA >60 01/10/2023   GFRAA >60 07/21/2020    Lab Results  Component Value Date   CEA1 68.8 (H) 02/23/2021   CEA <0.5 10/15/2008   [id number] 9,531 (H) 01/10/2023     Medications: I have reviewed the patient's current medications.   Assessment/Plan: Pancreas cancer -CT abdomen/pelvis without contrast 02/16/2021-multiple large hypoechoic masses in the patient's  liver consistent metastatic disease, ill-defined masslike area in the pancreatic head measuring approximate 4.7 cm, possible filling defect in the right ventricle. -EGD 02/19/2021-large infiltrative mass with bleeding in the second portion of the duodenum, appears to be arising near the ampulla, partially obstructive with friable mucosa.  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 -Flexible sigmoidoscopy 02/19/2021-diverticulosis in the sigmoid colon, nonbleeding external and internal hemorrhoids -Cardiac CT 02/20/2021-mass in the mid RV attached to the interventricular septum measuring 16 mm x 6 mm and concerning for metastatic tumor -MRI abdomen with/without contrast 02/21/2021-mass in the posterior pancreatic head measuring 4.3 x 3.8 cm with obstruction of the central pancreatic duct and diffuse dilatation up to 6 mm consistent with pancreatic adenocarcinoma, liver lesions the largest mass of the central left lobe measuring 9.9 x 9.2 cm consistent with hepatic metastatic disease,  hepatomegaly. -Ultrasound-guided biopsy of a left liver lesion 02/24/2021-adenocarcinoma, morphologically similar to the duodenal biopsy; microsatellite stable, tumor mutation burden 1 -Negative genetic testing -Palliative radiation to the duodenal mass 02/25/2021-03/06/2021 -Cycle 1 FOLFOX 03/17/2021 -Cycle 2 FOLFOX 04/01/2021 -Cycle 3 FOLFIRINOX 04/14/2021 -Cycle 4 FOLFIRINOX 04/27/2021, Udenyca added -Cycle 5 FOLFIRINOX 05/12/2021, Udenyca -MRI abdomen 05/23/2021-decrease in size of pancreas mass and hepatic lesions, no progressive disease -Cycle 6 FOLFIRINOX 05/27/2021 -CT 06/17/2021-multiple liver metastases similar to her prior exam.  Pancreas mass not significantly changed. -CT 06/26/2021-stable pancreas mass, no significant change in size and number of known liver  metastases. -CT abdomen/pelvis 07/12/2021-new gastrostomy tube.  Multiple liver masses slightly decreased in size from prior exam.  Pancreas  head mass unchanged. -Cycle 1 gemcitabine/Abraxane 07/30/2021 -Cycle 2 gemcitabine/Abraxane 08/13/2021 -Cycle 3 gemcitabine 08/27/2021, Abraxane held due to significant bilateral toe pain, question neuropathy -Cycle 4 gemcitabine, Abraxane held due to neuropathy, 09/10/2021 -Cycle 5 gemcitabine, Abraxane held due to neuropathy, 09/24/2021 -Cycle 6 gemcitabine, Abraxane held due to neuropathy 10/16/2021 -CTs 10/29/2021-mild progression of multifocal liver metastases; mild increase in size of hypoenhancing mass within the head of the pancreas; new mild pleural nodularity along the posterior lower lobes -Cycle 7 gemcitabine/Abraxane 11/03/2021 -Cycle 8 gemcitabine/Abraxane 11/23/2021 -Cycle 9 gemcitabine/Abraxane 12/10/2021 -Cycle 10 Gemcitabine/Abraxane 12/24/2021 -CT abdomen/pelvis 01/04/2022-decrease size of pancreas head mass, no change in multifocal hepatic metastases -Cycle 11 gemcitabine/Abraxane 01/07/2022 -Cycle 12 gemcitabine/Abraxane 02/04/2022 -Cycle 13 Gemcitabine/Abraxane 02/18/2022 -Cycle 14 gemcitabine/Abraxane 03/11/2022 -Cycle 15 gemcitabine/Abraxane 03/29/2022 -Cycle 16 gemcitabine/Abraxane 04/16/2022 -CT abdomen/pelvis 04/22/2022-mixed response in the liver, some lesions larger, some smaller, no change in pancreas head mass, no evidence of metastatic disease elsewhere in the abdomen or pelvis -Cycle 17 gemcitabine/Abraxane 05/17/2022 -Cycle 18 Gemcitabine/Abraxane 06/01/2022 -Cycle 19 gemcitabine/Abraxane 06/18/2022 -Cycle 20 gemcitabine/Abraxane 07/05/2022 -Cycle 21 gemcitabine/Abraxane 08/09/2022 -Cycle 22 gemcitabine/Abraxane 08/23/2022 -CT abdomen/pelvis 09/01/2022-stable liver metastases, difficult to visualize the pancreas head mass, no evidence of disease progression -Cycle 23 gemcitabine/Abraxane 09/06/2022 -Cycle 24 gemcitabine/Abraxane 09/20/2022 -Cycle 25 gemcitabine/Abraxane 10/18/2022 -Cycle 26 gemcitabine/Abraxane 11/01/2022 -Cycle 27 gemcitabine/Abraxane 11/29/2022 -CT  abdomen/pelvis 12/14/2022-increased size of liver metastases, less well-defined pancreas mass, 4 mm right lung nodule -Cycle 1 FOLFIRINOX 01/10/2023 -Cycle 2 FOLFIRINOX 01/24/2023, 5-FU dose reduced secondary to mucositis, chemotherapy plan dose reduce for weight loss 2.  Iron deficiency anemia-improved -Feraheme 510 mg 02/20/2021 3.  Protein calorie malnutrition 4.  Possible right ventricular filling defect on noncontrast CT scan MRI cardiac morphology 02/21/2021-mass in the mid RV attached to the interventricular septum measures 16 mm x 6 mm.  Mass appears heterogeneous with area of enhancement on LGE imaging.  Mass is concerning for metastatic tumor.  Normal LV size and systolic function.  Normal RV size and systolic function. 5.  Hypertension 6.  History of CVA 7.  Hyperlipidemia 8.  Hypothyroidism 9.  Morbid obesity 10.  Asthma 11.  Migraines 12.  Pain secondary to #1 13.  Port-A-Cath placement 02/25/2021 14.  Hypokalemia 04/01/2021-started a potassium supplement 15.  Hospital admission 06/17/2021-intractable nausea and vomiting EGD 06/19/2021-diffuse gastritis, nonobstructing duodenal mass, biopsy of stomach-no malignancy or H. pylori, duodenal mass-adenocarcinoma 16.  Hospital admission 07/12/2021 with nausea/vomiting, abdominal pain, and hypokalemia 17.  Percutaneous gastrostomy tube placement 07/01/2021 18.  07/21/2022 bilateral lower extremity venous Doppler study-negative for DVT.        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  She completed 1 cycle of salvage therapy with FOLFIRINOX.  She tolerated the FOLFIRINOX well aside from recurrent candidiasis and development of "mouth sores ".  She will complete cycle 2 today.  Chemotherapy will be dose reduced based on her weight loss and the mucositis.  She will complete a course of higher dose fluconazole.  Wendy Hoffman will return for an office visit and chemotherapy in 2 weeks.  Will check the CA 19-9 when she returns in 2  weeks.  She will be scheduled for restaging CTs after 4-5 cycles of FOLFIRINOX.  Betsy Coder, MD  01/24/2023  9:30 AM  Addendum-approximately 15 minutes into the oxaliplatin infusion she developed multiple areas of pruritus.  She denied tongue swelling, shortness of breath, chest pain.  Vital signs  were stable.  Lungs clear, heart regular.  No urticarial lesions.  There were scattered areas of erythema in the areas she actively scratched.  She received Benadryl and Solu-Medrol.  Pruritus resolved.  Oxaliplatin was resumed at one half the previous rate.  The pruritus recurred.  Oxaliplatin was discontinued.  She was given additional Benadryl.  She completed the remainder of the regimen.  Ned Card, NP

## 2023-01-24 NOTE — Patient Instructions (Signed)

## 2023-01-24 NOTE — Progress Notes (Signed)
Patient seen by Dr. Benay Spice today  Vitals are within treatment parameters.  Labs reviewed by Dr. Benay Spice and are within treatment parameters.OK to treat without ANC result today  Per physician team, patient is ready for treatment. Please note that modifications are being made to the treatment plan including MD has dose reduced chemotherapy for weight loss and has removed 5FU bolus.

## 2023-01-25 ENCOUNTER — Telehealth: Payer: Self-pay

## 2023-01-25 ENCOUNTER — Inpatient Hospital Stay: Payer: Medicaid Other

## 2023-01-25 ENCOUNTER — Other Ambulatory Visit: Payer: Self-pay | Admitting: Oncology

## 2023-01-25 DIAGNOSIS — J4540 Moderate persistent asthma, uncomplicated: Secondary | ICD-10-CM

## 2023-01-25 NOTE — Telephone Encounter (Signed)
CSW attempted to contact patient to discuss Wendy Hoffman per Merceda Elks, RN.  Left vm.

## 2023-01-26 ENCOUNTER — Inpatient Hospital Stay: Payer: Medicaid Other

## 2023-01-26 VITALS — BP 136/81 | HR 86 | Temp 97.5°F | Resp 20

## 2023-01-26 DIAGNOSIS — Z5111 Encounter for antineoplastic chemotherapy: Secondary | ICD-10-CM | POA: Diagnosis not present

## 2023-01-26 DIAGNOSIS — C250 Malignant neoplasm of head of pancreas: Secondary | ICD-10-CM

## 2023-01-26 LAB — CANCER ANTIGEN 19-9: CA 19-9: 14386 U/mL — ABNORMAL HIGH (ref 0–35)

## 2023-01-26 MED ORDER — PEGFILGRASTIM-CBQV 6 MG/0.6ML ~~LOC~~ SOSY
6.0000 mg | PREFILLED_SYRINGE | Freq: Once | SUBCUTANEOUS | Status: AC
  Administered 2023-01-26: 6 mg via SUBCUTANEOUS
  Filled 2023-01-26: qty 0.6

## 2023-01-26 MED ORDER — HEPARIN SOD (PORK) LOCK FLUSH 100 UNIT/ML IV SOLN
500.0000 [IU] | Freq: Once | INTRAVENOUS | Status: AC | PRN
  Administered 2023-01-26: 500 [IU]

## 2023-01-26 MED ORDER — SODIUM CHLORIDE 0.9% FLUSH
10.0000 mL | INTRAVENOUS | Status: DC | PRN
  Administered 2023-01-26: 10 mL

## 2023-01-26 NOTE — Progress Notes (Signed)
Pt here for pump stop. Continues to have occasional vomiting. Approximately 3 times a day. Is worse with ambulation and talking. Pt has decreased appetite and is able to drink small amounts of fluids.  Pt vital signs are WNL. Pt not interested in receiving fluids.

## 2023-01-26 NOTE — Patient Instructions (Signed)

## 2023-01-27 ENCOUNTER — Other Ambulatory Visit: Payer: Self-pay

## 2023-01-28 ENCOUNTER — Ambulatory Visit: Payer: Medicaid Other | Attending: Internal Medicine | Admitting: Internal Medicine

## 2023-01-28 NOTE — Progress Notes (Deleted)
Cardiology Office Note:    Date:  01/28/2023   ID:  Wendy Hoffman, DOB 1966/07/17, MRN [id number]  PCP:  Marrian Salvage, [place] Providers Cardiologist:  None { Click to update primary MD,subspecialty MD or APP then REFRESH:1}    Referring MD: Marrian Salvage,*   No chief complaint on file. ***  History of Present Illness:    Wendy Hoffman is a 57 y.o. female with a hx of stage IV pancreatic *** referral from her primary provider Marvis Repress for chest pain and SOB when walking.    Cardiology Studies: TTE 02/17/2021- EF 55-60%, no valve dx  CMR 02/20/2021: 1. Mass in mid RV attached to interventricular septum measures 73m x 675m Mass appears heterogenous, with area of enhancement on LGE imaging. Given patient with suspected malignancy, mass is concerning for metastatic tumor   2. Normal LV size and systolic function (EF [id number] No late gadolinium enhancement to suggest myocardial scar   3.  Normal RV size and systolic function (EF [id number] Past Medical History:  Diagnosis Date   Anemia    Anxiety    [place]) [place]   Arthritis    Asthma    Cervical ca ([facility])    H/O: hysterectomy    High cholesterol    Hypertension    Hypothyroidism    Insomnia    Medical history non-contributory    Migraine    [place]   [place] [place]   Shortness of breath    [facility]   Thyroid disease     Past Surgical History:  Procedure Laterality Date   ABDOMINAL HYSTERECTOMY     BIOPSY  02/19/2021   Procedure: BIOPSY;  Surgeon: NaMauri PoleMD;  Location: [facility];  Service: Endoscopy;;   BIOPSY  06/19/2021   Procedure: BIOPSY;  Surgeon: BrOtis BraceMD;  Location: [facility];  Service: Gastroenterology;;   CESAREAN SECTION     3 occasions   CHOLECYSTECTOMY     ESOPHAGOGASTRODUODENOSCOPY (EGD) WITH PROPOFOL N/A 02/19/2021   Procedure: ESOPHAGOGASTRODUODENOSCOPY (EGD)  WITH PROPOFOL;  Surgeon: NaMauri PoleMD;  Location: [facility];  Service: Endoscopy;  Laterality: N/A;   ESOPHAGOGASTRODUODENOSCOPY (EGD) WITH PROPOFOL N/A 06/19/2021   Procedure: ESOPHAGOGASTRODUODENOSCOPY (EGD) WITH PROPOFOL;  Surgeon: BrOtis BraceMD;  Location: [facility];  Service: Gastroenterology;  Laterality: N/A;   FLEXIBLE SIGMOIDOSCOPY N/A 02/19/2021   Procedure: FLEXIBLE SIGMOIDOSCOPY;  Surgeon: NaMauri PoleMD;  Location: [facility];  Service: Endoscopy;  Laterality: N/A;   IR GASTROSTOMY TUBE MOD SED  07/01/2021   IR GASTROSTOMY TUBE REMOVAL  08/21/2021   IR IMAGING GUIDED PORT INSERTION  02/24/2021   IR [place] BX ASP/DRAIN  02/24/2021   THYROIDECTOMY, PARTIAL     Goiter    Current Medications: No outpatient medications have been marked as taking for the 01/28/23 encounter (Appointment) with BrJanina MayoMD.     Allergies:   Blueberry [vaccinium angustifolium], Mango flavor, Oxaliplatin, Reglan [metoclopramide], Aspirin, and Penicillins   Social History   Socioeconomic History   Marital status: Single    Spouse name: Not on file   Number of children: Not on file   Years of education: Not on file   Highest education level: Not on file  Occupational History   Not on file  Tobacco Use   Smoking status: Former    Types: Cigarettes   Smokeless tobacco: Never  Vaping Use  Vaping Use: Never used  Substance and Sexual Activity   Alcohol use: No   Drug use: No   Sexual activity: Not on file  Other Topics Concern   Not on file  Social History Narrative   Not on file   Social Determinants of Health   Financial Resource Strain: Medium Risk (12/14/2022)   Overall Financial Resource Strain (CARDIA)    Difficulty of Paying Living Expenses: Somewhat hard  Food Insecurity: Food Insecurity Present (12/14/2022)   Hunger Vital Sign    Worried About Running Out of Food in the Last Year: Sometimes true    Ran Out of Food in the Last Year: Sometimes  true  Transportation Needs: No Transportation Needs (12/14/2022)   PRAPARE - Hydrologist (Medical): No    Lack of Transportation (Non-Medical): No  Physical Activity: Not on file  Stress: Stress Concern Present (12/14/2022)   [place]    Feeling of Stress : To some extent  Social Connections: Socially Isolated (12/14/2022)   Social Connection and Isolation Panel [NHANES]    Frequency of Communication with Friends and Family: More than three times a week    Frequency of Social Gatherings with Friends and Family: More than three times a week    Attends Religious Services: Never    Marine scientist or Organizations: No    Attends Music therapist: Never    Marital Status: Never married     Family History: The patient's ***family history includes Migraines in her sister; Non-Hodgkin's lymphoma in her half-brother and another family member; Pancreatic cancer (age of onset: 75) in an other family member.  ROS:   Please see the history of present illness.    *** All other systems reviewed and are negative.  EKGs/Labs/Other Studies Reviewed:    The following studies were reviewed today: ***  EKG:  EKG is *** ordered today.  The ekg ordered today demonstrates ***  Recent Labs: 01/24/2023: ALT 8; BUN 12; Creatinine 1.14; Hemoglobin 10.0; Platelet Count 253; Potassium 3.7; Sodium 137  Recent Lipid Panel    Component Value Date/Time   CHOL  10/15/2008 0535    137        ATP III CLASSIFICATION:  <200     mg/dL   Desirable  200-239  mg/dL   Borderline High  >=240    mg/dL   High   TRIG 155 (H) 10/15/2008 0535   HDL 35 (L) 10/15/2008 0535   CHOLHDL 3.9 10/15/2008 0535   VLDL 31 10/15/2008 0535   LDLCALC  10/15/2008 0535    71        Total Cholesterol/HDL:CHD Risk Coronary Heart Disease Risk Table                     Men   Women  1/2 Average Risk   3.4   3.3     Risk  Assessment/Calculations:   {Does this patient have ATRIAL FIBRILLATION?[phone]}  No BP recorded.  {Refresh Note OR Click here to enter BP  :1}***         Physical Exam:    VS:  There were no vitals taken for this visit.    Wt Readings from Last 3 Encounters:  01/24/23 182 lb (82.6 kg)  01/10/23 195 lb 3.2 oz (88.5 kg)  12/27/22 202 lb 9.6 oz (91.9 kg)     GEN: *** Well nourished, well developed in no  acute distress HEENT: Normal NECK: No JVD; No carotid bruits LYMPHATICS: No lymphadenopathy CARDIAC: ***RRR, no murmurs, rubs, gallops RESPIRATORY:  Clear to auscultation without rales, wheezing or rhonchi  ABDOMEN: Soft, non-tender, non-distended MUSCULOSKELETAL:  No edema; No deformity  SKIN: Warm and dry NEUROLOGIC:  Alert and oriented x 3 PSYCHIATRIC:  Normal affect   ASSESSMENT:    No diagnosis found. PLAN:    In order of problems listed above:  ***      {Are you ordering a CV Procedure (e.g. stress test, cath, DCCV, TEE, etc)?   Press F2        :[id number]    Medication Adjustments/Labs and Tests Ordered: Current medicines are reviewed at length with the patient today.  Concerns regarding medicines are outlined above.  No orders of the defined types were placed in this encounter.  No orders of the defined types were placed in this encounter.   There are no Patient Instructions on file for this visit.   Signed, Janina Mayo, MD  01/28/2023 7:53 AM    [place]

## 2023-02-02 ENCOUNTER — Other Ambulatory Visit: Payer: Self-pay | Admitting: *Deleted

## 2023-02-02 ENCOUNTER — Telehealth: Payer: Self-pay | Admitting: *Deleted

## 2023-02-02 ENCOUNTER — Inpatient Hospital Stay: Payer: Medicaid Other | Admitting: Nutrition

## 2023-02-02 DIAGNOSIS — C250 Malignant neoplasm of head of pancreas: Secondary | ICD-10-CM

## 2023-02-02 NOTE — Progress Notes (Signed)
Nutrition follow up completed with patient over the phone.   Patient receiving FOLFIRINOX for Pancreas cancer and followed by Dr. Benay Spice.  Weight documented as 182 pounds on Feb 14, decreased from 218 pounds on Nov 6. This is 17% loss over 3 months which is clinically significant.  Labs reviewed and noted Creatinine 1.14 and Albumin 3.2 on Feb 12.  Patient reports she has vomiting and diarrhea since last chemo infusion on Feb 12. Tolerating grits or cream of wheat and Ensure "sometimes". Reports vomiting 1-2 times daily on anti-emetics. Reports multiple loose stools/diarrhea daily. She has not treated diarrhea however she has "imodium around here someplace." Denies fever. Abdominal pain is worse. States she takes her pain medications.  Nutrition Diagnosis: Inadequate oral intake related to cancer and associated treatments as evidenced by wt loss, nausea and vomiting and diarrhea.  Intervention: Encouraged increased fluids as tolerated. Take anti emetics as scheduled. Recommend Imodium for diarrhea per RN. Continue ensure as tolerated. Provided education on soft foods to help diarrhea and fluids to minimize dehydration. Consider IVF. Informed RN.  Monitoring, Evaluation, Goals: Patient will tolerate increased calories, protein and fluids  Next Visit:Telephone call next week.

## 2023-02-02 NOTE — Telephone Encounter (Signed)
Wendy Hoffman reports multiple liquid stool since 01/24/23 chemo and asking what to take (has not taken anything). Instructed her to take Imodium #2 tablets QID starting now, she reports she has these in house. Also reports abdominal cramping. Denies any fever. Urine looks normal to her. She has also had nausea w/vomiting bid since 2/12. Takes ODT Zofran as needed and has run out of prochlorperazine--has called for refill. Instructed her to take the zofran every 8 hours and supplement with prochlorperazine every 6 hours ATC till she comes in tomorrow. Will get her in for IV fluids and labs tomorrow-she agrees and will try to find transportation. Plan approved by NP.

## 2023-02-03 ENCOUNTER — Inpatient Hospital Stay: Payer: Medicaid Other

## 2023-02-04 ENCOUNTER — Other Ambulatory Visit: Payer: Self-pay | Admitting: Nurse Practitioner

## 2023-02-04 ENCOUNTER — Telehealth: Payer: Self-pay | Admitting: *Deleted

## 2023-02-04 DIAGNOSIS — C259 Malignant neoplasm of pancreas, unspecified: Secondary | ICD-10-CM

## 2023-02-04 DIAGNOSIS — C250 Malignant neoplasm of head of pancreas: Secondary | ICD-10-CM

## 2023-02-04 MED ORDER — MORPHINE SULFATE (CONCENTRATE) 10 MG/0.5ML PO SOLN: 20.0000 mg | Freq: Three times a day (TID) | ORAL | 0 refills | Status: DC | PRN

## 2023-02-04 MED ORDER — MORPHINE SULFATE ER 60 MG PO TBCR: 60.0000 mg | EXTENDED_RELEASE_TABLET | Freq: Two times a day (BID) | ORAL | 0 refills | Status: DC

## 2023-02-04 MED ORDER — PANTOPRAZOLE SODIUM 40 MG PO TBEC: DELAYED_RELEASE_TABLET | ORAL | 2 refills | Status: DC

## 2023-02-04 NOTE — Telephone Encounter (Signed)
Patient left VM requesting refills on pantoprozole, MS Contin and Morphine sulfate liquid.

## 2023-02-06 ENCOUNTER — Other Ambulatory Visit: Payer: Self-pay | Admitting: Oncology

## 2023-02-07 ENCOUNTER — Inpatient Hospital Stay: Payer: Medicaid Other

## 2023-02-07 ENCOUNTER — Encounter: Payer: Self-pay | Admitting: Nurse Practitioner

## 2023-02-07 ENCOUNTER — Inpatient Hospital Stay (HOSPITAL_BASED_OUTPATIENT_CLINIC_OR_DEPARTMENT_OTHER): Payer: Medicaid Other | Admitting: Nurse Practitioner

## 2023-02-07 VITALS — BP 139/82 | HR 90

## 2023-02-07 VITALS — BP 109/71 | HR 100 | Temp 98.1°F | Resp 18 | Ht 64.0 in | Wt 176.0 lb

## 2023-02-07 DIAGNOSIS — C250 Malignant neoplasm of head of pancreas: Secondary | ICD-10-CM

## 2023-02-07 DIAGNOSIS — Z95828 Presence of other vascular implants and grafts: Secondary | ICD-10-CM

## 2023-02-07 DIAGNOSIS — Z5111 Encounter for antineoplastic chemotherapy: Secondary | ICD-10-CM | POA: Diagnosis not present

## 2023-02-07 LAB — CMP (CANCER CENTER ONLY)
ALT: 8 U/L (ref 0–44)
AST: 16 U/L (ref 15–41)
Albumin: 3.6 g/dL (ref 3.5–5.0)
Alkaline Phosphatase: 138 U/L — ABNORMAL HIGH (ref 38–126)
Anion gap: 8 (ref 5–15)
BUN: 19 mg/dL (ref 6–20)
CO2: 31 mmol/L (ref 22–32)
Calcium: 10.1 mg/dL (ref 8.9–10.3)
Chloride: 97 mmol/L — ABNORMAL LOW (ref 98–111)
Creatinine: 1.27 mg/dL — ABNORMAL HIGH (ref 0.44–1.00)
GFR, Estimated: 50 mL/min — ABNORMAL LOW (ref >60)
Glucose, Bld: 95 mg/dL (ref 70–99)
Potassium: 3.7 mmol/L (ref 3.5–5.1)
Sodium: 136 mmol/L (ref 135–145)
Total Bilirubin: 0.4 mg/dL (ref 0.3–1.2)
Total Protein: 6.4 g/dL — ABNORMAL LOW (ref 6.5–8.1)

## 2023-02-07 LAB — CBC WITH DIFFERENTIAL (CANCER CENTER ONLY)
Abs Immature Granulocytes: 0.57 10*3/uL — ABNORMAL HIGH (ref 0.00–0.07)
Basophils Absolute: 0.1 10*3/uL (ref 0.0–0.1)
Basophils Relative: 1 %
Eosinophils Absolute: 1.2 10*3/uL — ABNORMAL HIGH (ref 0.0–0.5)
Eosinophils Relative: 9 %
HCT: 34.1 % — ABNORMAL LOW (ref 36.0–46.0)
Hemoglobin: 11.2 g/dL — ABNORMAL LOW (ref 12.0–15.0)
Immature Granulocytes: 4 %
Lymphocytes Relative: 15 %
Lymphs Abs: 2 10*3/uL (ref 0.7–4.0)
MCH: 29.4 pg (ref 26.0–34.0)
MCHC: 32.8 g/dL (ref 30.0–36.0)
MCV: 89.5 fL (ref 80.0–100.0)
Monocytes Absolute: 0.9 10*3/uL (ref 0.1–1.0)
Monocytes Relative: 6 %
Neutro Abs: 9.1 10*3/uL — ABNORMAL HIGH (ref 1.7–7.7)
Neutrophils Relative %: 65 %
Platelet Count: 443 10*3/uL — ABNORMAL HIGH (ref 150–400)
RBC: 3.81 MIL/uL — ABNORMAL LOW (ref 3.87–5.11)
RDW: 21.7 % — ABNORMAL HIGH (ref 11.5–15.5)
WBC Count: 13.9 10*3/uL — ABNORMAL HIGH (ref 4.0–10.5)
nRBC: 0 % (ref 0.0–0.2)

## 2023-02-07 LAB — MAGNESIUM: Magnesium: 2 mg/dL (ref 1.7–2.4)

## 2023-02-07 MED ORDER — DEXAMETHASONE 2 MG PO TABS: ORAL_TABLET | ORAL | 2 refills | Status: DC

## 2023-02-07 MED ORDER — SODIUM CHLORIDE 0.9 % IV SOLN: INTRAVENOUS | Status: AC

## 2023-02-07 MED ORDER — SODIUM CHLORIDE 0.9% FLUSH
10.0000 mL | Freq: Once | INTRAVENOUS | Status: AC
  Administered 2023-02-07: 10 mL via INTRAVENOUS

## 2023-02-07 MED ORDER — SODIUM CHLORIDE 0.9 % IV SOLN
10.0000 mg | Freq: Once | INTRAVENOUS | Status: AC
  Administered 2023-02-07: 10 mg via INTRAVENOUS
  Filled 2023-02-07: qty 1

## 2023-02-07 MED ORDER — PREDNISONE 10 MG PO TABS: 10.0000 mg | ORAL_TABLET | Freq: Every day | ORAL | 1 refills | Status: DC

## 2023-02-07 MED ORDER — HEPARIN SOD (PORK) LOCK FLUSH 100 UNIT/ML IV SOLN
500.0000 [IU] | Freq: Once | INTRAVENOUS | Status: AC
  Administered 2023-02-07: 500 [IU] via INTRAVENOUS

## 2023-02-07 NOTE — Patient Instructions (Signed)

## 2023-02-07 NOTE — Patient Instructions (Signed)

## 2023-02-07 NOTE — Progress Notes (Unsigned)
Patient seen by Ned Card NP today  Vitals are within treatment parameters.  Labs reviewed by Ned Card NP and are within treatment parameters.  Per physician team, patient will not be receiving treatment today. Pt to receive IVF today per Ned Card, NP today.

## 2023-02-07 NOTE — Progress Notes (Unsigned)
[facility] OFFICE PROGRESS NOTE   Diagnosis: Pancreas cancer  INTERVAL HISTORY:   Wendy Hoffman returns as scheduled.  She completed cycle 2 FOLFIRINOX 01/24/2023.  5-FU was dose reduced due to mucositis with the previous cycle.  Chemotherapy plan was dose reduced for weight loss.  Approximately 15 minutes into the oxaliplatin infusion she developed pruritus involving multiple areas.  She developed erythema and areas she actively scratched.  She received Benadryl and Solu-Medrol.  The pruritus resolved and Oxaliplatin was resumed at one half the previous rate.  The pruritus recurred and oxaliplatin was discontinued.  She was given additional Benadryl.  She completed the remainder of the regimen.  She reports developing diarrhea, 3-4 times a day, on day 3.  On day 4 she began experiencing nausea/vomiting.  She contacted the office on 02/02/2023 to report symptoms.  She started Imodium same day.  The diarrhea had resolved within 48 hours.  Nausea/vomiting also better though she continues to vomit periodically.  No mouth sores.    Objective:  Vital signs in last 24 hours:  Blood pressure 109/71, pulse 100, temperature 98.1 F (36.7 C), temperature source Oral, resp. rate 18, height '5\' 4"'$  (1.626 m), weight 176 lb (79.8 kg), SpO2 98 %.    HEENT: White coating over tongue.  No buccal thrush.  No ulcers. Resp: Lungs clear bilaterally. Cardio: Regular rate and rhythm. GI: Abdomen soft.  Tender across the upper abdomen.  No hepatomegaly. Vascular: No leg edema. Skin: Palms without erythema. Port-A-Cath without erythema.   Lab Results:  Lab Results  Component Value Date   WBC 13.9 (H) 02/07/2023   HGB 11.2 (L) 02/07/2023   HCT 34.1 (L) 02/07/2023   MCV 89.5 02/07/2023   PLT 443 (H) 02/07/2023   NEUTROABS 9.1 (H) 02/07/2023    Imaging:  No results found.  Medications: I have reviewed the patient's current medications.  Assessment/Plan: Pancreas cancer -CT  abdomen/pelvis without contrast 02/16/2021-multiple large hypoechoic masses in the patient's liver consistent metastatic disease, ill-defined masslike area in the pancreatic head measuring approximate 4.7 cm, possible filling defect in the right ventricle. -EGD 02/19/2021-large infiltrative mass with bleeding in the second portion of the duodenum, appears to be arising near the ampulla, partially obstructive with friable mucosa.  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 -Flexible sigmoidoscopy 02/19/2021-diverticulosis in the sigmoid colon, nonbleeding external and internal hemorrhoids -Cardiac CT 02/20/2021-mass in the mid RV attached to the interventricular septum measuring 16 mm x 6 mm and concerning for metastatic tumor -MRI abdomen with/without contrast 02/21/2021-mass in the posterior pancreatic head measuring 4.3 x 3.8 cm with obstruction of the central pancreatic duct and diffuse dilatation up to 6 mm consistent with pancreatic adenocarcinoma, liver lesions the largest mass of the central left lobe measuring 9.9 x 9.2 cm consistent with hepatic metastatic disease,  hepatomegaly. -Ultrasound-guided biopsy of a left liver lesion 02/24/2021-adenocarcinoma, morphologically similar to the duodenal biopsy; microsatellite stable, tumor mutation burden 1 -Negative genetic testing -Palliative radiation to the duodenal mass 02/25/2021-03/06/2021 -Cycle 1 FOLFOX 03/17/2021 -Cycle 2 FOLFOX 04/01/2021 -Cycle 3 FOLFIRINOX 04/14/2021 -Cycle 4 FOLFIRINOX 04/27/2021, Udenyca added -Cycle 5 FOLFIRINOX 05/12/2021, Udenyca -MRI abdomen 05/23/2021-decrease in size of pancreas mass and hepatic lesions, no progressive disease -Cycle 6 FOLFIRINOX 05/27/2021 -CT 06/17/2021-multiple liver metastases similar to her prior exam.  Pancreas mass not significantly changed. -CT 06/26/2021-stable pancreas mass, no significant change in size and number of known liver metastases. -CT abdomen/pelvis 07/12/2021-new  gastrostomy tube.  Multiple liver masses slightly decreased in size  from prior exam.  Pancreas head mass unchanged. -Cycle 1 gemcitabine/Abraxane 07/30/2021 -Cycle 2 gemcitabine/Abraxane 08/13/2021 -Cycle 3 gemcitabine 08/27/2021, Abraxane held due to significant bilateral toe pain, question neuropathy -Cycle 4 gemcitabine, Abraxane held due to neuropathy, 09/10/2021 -Cycle 5 gemcitabine, Abraxane held due to neuropathy, 09/24/2021 -Cycle 6 gemcitabine, Abraxane held due to neuropathy 10/16/2021 -CTs 10/29/2021-mild progression of multifocal liver metastases; mild increase in size of hypoenhancing mass within the head of the pancreas; new mild pleural nodularity along the posterior lower lobes -Cycle 7 gemcitabine/Abraxane 11/03/2021 -Cycle 8 gemcitabine/Abraxane 11/23/2021 -Cycle 9 gemcitabine/Abraxane 12/10/2021 -Cycle 10 Gemcitabine/Abraxane 12/24/2021 -CT abdomen/pelvis 01/04/2022-decrease size of pancreas head mass, no change in multifocal hepatic metastases -Cycle 11 gemcitabine/Abraxane 01/07/2022 -Cycle 12 gemcitabine/Abraxane 02/04/2022 -Cycle 13 Gemcitabine/Abraxane 02/18/2022 -Cycle 14 gemcitabine/Abraxane 03/11/2022 -Cycle 15 gemcitabine/Abraxane 03/29/2022 -Cycle 16 gemcitabine/Abraxane 04/16/2022 -CT abdomen/pelvis 04/22/2022-mixed response in the liver, some lesions larger, some smaller, no change in pancreas head mass, no evidence of metastatic disease elsewhere in the abdomen or pelvis -Cycle 17 gemcitabine/Abraxane 05/17/2022 -Cycle 18 Gemcitabine/Abraxane 06/01/2022 -Cycle 19 gemcitabine/Abraxane 06/18/2022 -Cycle 20 gemcitabine/Abraxane 07/05/2022 -Cycle 21 gemcitabine/Abraxane 08/09/2022 -Cycle 22 gemcitabine/Abraxane 08/23/2022 -CT abdomen/pelvis 09/01/2022-stable liver metastases, difficult to visualize the pancreas head mass, no evidence of disease progression -Cycle 23 gemcitabine/Abraxane 09/06/2022 -Cycle 24 gemcitabine/Abraxane 09/20/2022 -Cycle 25 gemcitabine/Abraxane 10/18/2022 -Cycle  26 gemcitabine/Abraxane 11/01/2022 -Cycle 27 gemcitabine/Abraxane 11/29/2022 -CT abdomen/pelvis 12/14/2022-increased size of liver metastases, less well-defined pancreas mass, 4 mm right lung nodule -Cycle 1 FOLFIRINOX 01/10/2023 -Cycle 2 FOLFIRINOX 01/24/2023, 5-FU dose reduced secondary to mucositis, chemotherapy plan dose reduce for weight loss -Develop pruritus during the oxaliplatin infusion, initial improvement with Benadryl and Solu-Medrol, oxaliplatin was resumed and the pruritus recurred.  Oxaliplatin was discontinued. -Chemotherapy held 02/07/2023 due to diarrhea, nausea/vomiting, dehydration 2.  Iron deficiency anemia-improved -Feraheme 510 mg 02/20/2021 3.  Protein calorie malnutrition 4.  Possible right ventricular filling defect on noncontrast CT scan MRI cardiac morphology 02/21/2021-mass in the mid RV attached to the interventricular septum measures 16 mm x 6 mm.  Mass appears heterogeneous with area of enhancement on LGE imaging.  Mass is concerning for metastatic tumor.  Normal LV size and systolic function.  Normal RV size and systolic function. 5.  Hypertension 6.  History of CVA 7.  Hyperlipidemia 8.  Hypothyroidism 9.  Morbid obesity 10.  Asthma 11.  Migraines 12.  Pain secondary to #1 13.  Port-A-Cath placement 02/25/2021 14.  Hypokalemia 04/01/2021-started a potassium supplement 15.  Hospital admission 06/17/2021-intractable nausea and vomiting EGD 06/19/2021-diffuse gastritis, nonobstructing duodenal mass, biopsy of stomach-no malignancy or H. pylori, duodenal mass-adenocarcinoma 16.  Hospital admission 07/12/2021 with nausea/vomiting, abdominal pain, and hypokalemia 17.  Percutaneous gastrostomy tube placement 07/01/2021 18.  07/21/2022 bilateral lower extremity venous Doppler study-negative for DVT.      Disposition: Wendy Hoffman appears stable.  She has completed 2 cycles of FOLFIRINOX.  Oxaliplatin was prematurely discontinued with cycle 2 due to pruritus.  She developed  significant diarrhea and nausea/vomiting, only recently improved.  We decided to hold today's treatment.  She would like to delay the next treatment for 2 weeks.  She will receive IV fluids and IV Decadron today.  If the nausea improves with Decadron she will begin daily prednisone.  She understands the nausea at this point is likely related to cancer rather than chemotherapy.  With the next cycle of chemotherapy the following changes will be made-dexamethasone 10 mg at 10 PM the night prior to treatment, change premeds day of chemotherapy from dexamethasone to Solu-Medrol, add Benadryl  25 mg IV, Pepcid has already been added, oxaliplatin infusion will be lengthened to 4 hours, irinotecan will be dose reduced.  She understands the potential for severe allergic reaction despite these changes and agrees to proceed.  She will return for lab, follow-up, chemotherapy in 2 weeks.  She will contact the office in the interim with further problems.  Patient seen with Dr. Benay Spice.    [name]   02/07/2023  10:09 AM This was a shared visit with Ned Card.  Wendy Hoffman has completed 2 cycles of salvage FOLFIRINOX therapy.  She developed pruritus during the oxaliplatin infusion with cycle 2.  She did not receive the full dose of oxaliplatin.  We discussed the risk/benefit of continuing oxaliplatin therapy.  She understands the chance of a recurrent allergic reaction, potentially more severe.  She would like to proceed with another trial of oxaliplatin therapy.  She developed diarrhea following the last cycle of chemotherapy.  Irinotecan will be dose reduced.  We will consider discontinuing chemotherapy and referring her for hospice care if her performance status continues to decline.  I was present for greater than 50% of today's visit.  I performed medical decision making.  Wendy Manson, MD

## 2023-02-08 ENCOUNTER — Encounter: Payer: Self-pay | Admitting: Oncology

## 2023-02-09 ENCOUNTER — Inpatient Hospital Stay: Payer: Medicaid Other

## 2023-02-09 ENCOUNTER — Inpatient Hospital Stay: Payer: Medicaid Other | Admitting: Nutrition

## 2023-02-09 ENCOUNTER — Other Ambulatory Visit: Payer: Self-pay

## 2023-02-09 LAB — CANCER ANTIGEN 19-9: CA 19-9: 22081 U/mL — ABNORMAL HIGH (ref 0–35)

## 2023-02-09 NOTE — Progress Notes (Signed)
Telephone call scheduled with patient. Contacted her, however, there was no answer. I left a message with my name and call back number.

## 2023-02-17 ENCOUNTER — Telehealth: Payer: Self-pay | Admitting: *Deleted

## 2023-02-17 MED ORDER — FLUCONAZOLE 200 MG PO TABS: 200.0000 mg | ORAL_TABLET | Freq: Every day | ORAL | 0 refills | Status: DC

## 2023-02-17 NOTE — Telephone Encounter (Signed)
Wendy Hoffman requesting refill on her Fluconazole reporting her thrush is back. MD notified.

## 2023-02-17 NOTE — Telephone Encounter (Signed)
Per Dr. Benay Spice: OK to refill fluconazole.

## 2023-02-21 ENCOUNTER — Inpatient Hospital Stay: Payer: Medicaid Other

## 2023-02-21 ENCOUNTER — Inpatient Hospital Stay: Payer: Medicaid Other | Admitting: Oncology

## 2023-02-21 ENCOUNTER — Inpatient Hospital Stay: Payer: Medicaid Other | Attending: Nurse Practitioner

## 2023-02-21 ENCOUNTER — Telehealth: Payer: Self-pay

## 2023-02-21 DIAGNOSIS — Z5111 Encounter for antineoplastic chemotherapy: Secondary | ICD-10-CM | POA: Insufficient documentation

## 2023-02-21 DIAGNOSIS — J45909 Unspecified asthma, uncomplicated: Secondary | ICD-10-CM | POA: Insufficient documentation

## 2023-02-21 DIAGNOSIS — I10 Essential (primary) hypertension: Secondary | ICD-10-CM | POA: Insufficient documentation

## 2023-02-21 DIAGNOSIS — G43909 Migraine, unspecified, not intractable, without status migrainosus: Secondary | ICD-10-CM | POA: Insufficient documentation

## 2023-02-21 DIAGNOSIS — E785 Hyperlipidemia, unspecified: Secondary | ICD-10-CM | POA: Insufficient documentation

## 2023-02-21 DIAGNOSIS — Z5189 Encounter for other specified aftercare: Secondary | ICD-10-CM | POA: Insufficient documentation

## 2023-02-21 DIAGNOSIS — C250 Malignant neoplasm of head of pancreas: Secondary | ICD-10-CM | POA: Insufficient documentation

## 2023-02-21 DIAGNOSIS — D509 Iron deficiency anemia, unspecified: Secondary | ICD-10-CM | POA: Insufficient documentation

## 2023-02-21 DIAGNOSIS — Z8673 Personal history of transient ischemic attack (TIA), and cerebral infarction without residual deficits: Secondary | ICD-10-CM | POA: Insufficient documentation

## 2023-02-21 DIAGNOSIS — E876 Hypokalemia: Secondary | ICD-10-CM | POA: Insufficient documentation

## 2023-02-21 DIAGNOSIS — E46 Unspecified protein-calorie malnutrition: Secondary | ICD-10-CM | POA: Insufficient documentation

## 2023-02-21 DIAGNOSIS — E039 Hypothyroidism, unspecified: Secondary | ICD-10-CM | POA: Insufficient documentation

## 2023-02-21 DIAGNOSIS — C787 Secondary malignant neoplasm of liver and intrahepatic bile duct: Secondary | ICD-10-CM | POA: Insufficient documentation

## 2023-02-21 NOTE — Telephone Encounter (Signed)
Patient did not show for appointments this morning.  Called and left voicemail requesting patient to contact office to reschedule.

## 2023-02-22 ENCOUNTER — Telehealth: Payer: Self-pay | Admitting: Oncology

## 2023-02-22 ENCOUNTER — Telehealth: Payer: Self-pay | Admitting: *Deleted

## 2023-02-22 ENCOUNTER — Inpatient Hospital Stay: Payer: Medicaid Other

## 2023-02-22 NOTE — Telephone Encounter (Signed)
Contact patient in regards to rescheduling missed appointments. Patient advised that she will follow up with scheduled appointments on March 25

## 2023-02-22 NOTE — Telephone Encounter (Signed)
Called Wendy Hoffman to f/u on "no show" yesterday. She reports she has had a migraine since Sunday. She has been trying to sleep it off. She did reach out to PCP for medication and was told they are calling something in for her. Would like to reschedule for this week, otherwise she will wait till she is back in town on 3/25. Scheduler notified.

## 2023-02-23 ENCOUNTER — Inpatient Hospital Stay: Payer: Medicaid Other

## 2023-02-24 ENCOUNTER — Inpatient Hospital Stay: Payer: Medicaid Other

## 2023-02-28 ENCOUNTER — Other Ambulatory Visit: Payer: Self-pay | Admitting: Oncology

## 2023-02-28 ENCOUNTER — Other Ambulatory Visit (HOSPITAL_BASED_OUTPATIENT_CLINIC_OR_DEPARTMENT_OTHER): Payer: Self-pay

## 2023-02-28 DIAGNOSIS — C259 Malignant neoplasm of pancreas, unspecified: Secondary | ICD-10-CM

## 2023-02-28 DIAGNOSIS — C250 Malignant neoplasm of head of pancreas: Secondary | ICD-10-CM

## 2023-03-01 ENCOUNTER — Other Ambulatory Visit (HOSPITAL_BASED_OUTPATIENT_CLINIC_OR_DEPARTMENT_OTHER): Payer: Self-pay

## 2023-03-01 ENCOUNTER — Other Ambulatory Visit: Payer: Self-pay | Admitting: Nurse Practitioner

## 2023-03-01 DIAGNOSIS — C250 Malignant neoplasm of head of pancreas: Secondary | ICD-10-CM

## 2023-03-01 MED ORDER — OXYCODONE HCL 10 MG PO TABS
10.0000 mg | ORAL_TABLET | ORAL | 0 refills | Status: DC | PRN
  Filled 2023-03-01: qty 60, 5d supply, fill #0
  Filled 2023-03-21: qty 40, 4d supply, fill #1

## 2023-03-07 ENCOUNTER — Inpatient Hospital Stay: Payer: Medicaid Other

## 2023-03-07 ENCOUNTER — Ambulatory Visit: Payer: Medicaid Other

## 2023-03-07 ENCOUNTER — Other Ambulatory Visit: Payer: Self-pay | Admitting: *Deleted

## 2023-03-07 ENCOUNTER — Inpatient Hospital Stay (HOSPITAL_BASED_OUTPATIENT_CLINIC_OR_DEPARTMENT_OTHER): Payer: Medicaid Other | Admitting: Oncology

## 2023-03-07 ENCOUNTER — Encounter: Payer: Self-pay | Admitting: Oncology

## 2023-03-07 DIAGNOSIS — G43909 Migraine, unspecified, not intractable, without status migrainosus: Secondary | ICD-10-CM | POA: Diagnosis not present

## 2023-03-07 DIAGNOSIS — E46 Unspecified protein-calorie malnutrition: Secondary | ICD-10-CM | POA: Diagnosis not present

## 2023-03-07 DIAGNOSIS — Z5111 Encounter for antineoplastic chemotherapy: Secondary | ICD-10-CM | POA: Diagnosis not present

## 2023-03-07 DIAGNOSIS — I10 Essential (primary) hypertension: Secondary | ICD-10-CM | POA: Diagnosis not present

## 2023-03-07 DIAGNOSIS — E785 Hyperlipidemia, unspecified: Secondary | ICD-10-CM | POA: Diagnosis not present

## 2023-03-07 DIAGNOSIS — Z8673 Personal history of transient ischemic attack (TIA), and cerebral infarction without residual deficits: Secondary | ICD-10-CM | POA: Diagnosis not present

## 2023-03-07 DIAGNOSIS — E876 Hypokalemia: Secondary | ICD-10-CM | POA: Diagnosis not present

## 2023-03-07 DIAGNOSIS — C250 Malignant neoplasm of head of pancreas: Secondary | ICD-10-CM

## 2023-03-07 DIAGNOSIS — E039 Hypothyroidism, unspecified: Secondary | ICD-10-CM | POA: Diagnosis not present

## 2023-03-07 DIAGNOSIS — D509 Iron deficiency anemia, unspecified: Secondary | ICD-10-CM | POA: Diagnosis not present

## 2023-03-07 DIAGNOSIS — C259 Malignant neoplasm of pancreas, unspecified: Secondary | ICD-10-CM

## 2023-03-07 DIAGNOSIS — J4540 Moderate persistent asthma, uncomplicated: Secondary | ICD-10-CM

## 2023-03-07 DIAGNOSIS — Z95828 Presence of other vascular implants and grafts: Secondary | ICD-10-CM

## 2023-03-07 DIAGNOSIS — C787 Secondary malignant neoplasm of liver and intrahepatic bile duct: Secondary | ICD-10-CM | POA: Diagnosis not present

## 2023-03-07 DIAGNOSIS — Z5189 Encounter for other specified aftercare: Secondary | ICD-10-CM | POA: Diagnosis not present

## 2023-03-07 DIAGNOSIS — J45909 Unspecified asthma, uncomplicated: Secondary | ICD-10-CM | POA: Diagnosis not present

## 2023-03-07 LAB — CBC WITH DIFFERENTIAL (CANCER CENTER ONLY)
Abs Immature Granulocytes: 0.05 10*3/uL (ref 0.00–0.07)
Basophils Absolute: 0 10*3/uL (ref 0.0–0.1)
Basophils Relative: 0 %
Eosinophils Absolute: 0.1 10*3/uL (ref 0.0–0.5)
Eosinophils Relative: 1 %
HCT: 28 % — ABNORMAL LOW (ref 36.0–46.0)
Hemoglobin: 9.4 g/dL — ABNORMAL LOW (ref 12.0–15.0)
Immature Granulocytes: 1 %
Lymphocytes Relative: 11 %
Lymphs Abs: 1.2 10*3/uL (ref 0.7–4.0)
MCH: 31 pg (ref 26.0–34.0)
MCHC: 33.6 g/dL (ref 30.0–36.0)
MCV: 92.4 fL (ref 80.0–100.0)
Monocytes Absolute: 1.2 10*3/uL — ABNORMAL HIGH (ref 0.1–1.0)
Monocytes Relative: 11 %
Neutro Abs: 8 10*3/uL — ABNORMAL HIGH (ref 1.7–7.7)
Neutrophils Relative %: 76 %
Platelet Count: 333 10*3/uL (ref 150–400)
RBC: 3.03 MIL/uL — ABNORMAL LOW (ref 3.87–5.11)
RDW: 18.7 % — ABNORMAL HIGH (ref 11.5–15.5)
WBC Count: 10.5 10*3/uL (ref 4.0–10.5)
nRBC: 0 % (ref 0.0–0.2)

## 2023-03-07 LAB — CMP (CANCER CENTER ONLY)
ALT: 7 U/L (ref 0–44)
AST: 16 U/L (ref 15–41)
Albumin: 3.1 g/dL — ABNORMAL LOW (ref 3.5–5.0)
Alkaline Phosphatase: 120 U/L (ref 38–126)
Anion gap: 6 (ref 5–15)
BUN: 16 mg/dL (ref 6–20)
CO2: 31 mmol/L (ref 22–32)
Calcium: 9.3 mg/dL (ref 8.9–10.3)
Chloride: 99 mmol/L (ref 98–111)
Creatinine: 0.83 mg/dL (ref 0.44–1.00)
GFR, Estimated: >60 mL/min (ref >60)
Glucose, Bld: 112 mg/dL — ABNORMAL HIGH (ref 70–99)
Potassium: 3.6 mmol/L (ref 3.5–5.1)
Sodium: 136 mmol/L (ref 135–145)
Total Bilirubin: 0.6 mg/dL (ref 0.3–1.2)
Total Protein: 5.9 g/dL — ABNORMAL LOW (ref 6.5–8.1)

## 2023-03-07 MED ORDER — MORPHINE SULFATE (CONCENTRATE) 10 MG/0.5ML PO SOLN: 20.0000 mg | Freq: Three times a day (TID) | ORAL | 0 refills | Status: DC | PRN

## 2023-03-07 MED ORDER — ALBUTEROL SULFATE HFA 108 (90 BASE) MCG/ACT IN AERS: INHALATION_SPRAY | RESPIRATORY_TRACT | 3 refills | Status: DC

## 2023-03-07 MED ORDER — ZOLPIDEM TARTRATE 10 MG PO TABS: 10.0000 mg | ORAL_TABLET | Freq: Every evening | ORAL | 2 refills | Status: DC | PRN

## 2023-03-07 MED ORDER — SODIUM CHLORIDE 0.9% FLUSH
10.0000 mL | Freq: Once | INTRAVENOUS | Status: AC
  Administered 2023-03-07: 10 mL

## 2023-03-07 MED ORDER — SYMBICORT 160-4.5 MCG/ACT IN AERO: 2.0000 | INHALATION_SPRAY | Freq: Two times a day (BID) | RESPIRATORY_TRACT | 2 refills | Status: DC

## 2023-03-07 MED ORDER — LIDOCAINE-PRILOCAINE 2.5-2.5 % EX CREA: TOPICAL_CREAM | CUTANEOUS | 2 refills | Status: DC

## 2023-03-07 MED ORDER — BISACODYL 5 MG PO TBEC: 10.0000 mg | DELAYED_RELEASE_TABLET | Freq: Every day | ORAL | 2 refills | Status: DC | PRN

## 2023-03-07 MED ORDER — SCOPOLAMINE 1 MG/3DAYS TD PT72: 1.0000 | MEDICATED_PATCH | TRANSDERMAL | 3 refills | Status: DC

## 2023-03-07 MED ORDER — ONDANSETRON HCL 8 MG PO TABS: 8.0000 mg | ORAL_TABLET | Freq: Three times a day (TID) | ORAL | 2 refills | Status: DC | PRN

## 2023-03-07 MED ORDER — MORPHINE SULFATE ER 60 MG PO TBCR: 60.0000 mg | EXTENDED_RELEASE_TABLET | Freq: Two times a day (BID) | ORAL | 0 refills | Status: DC

## 2023-03-07 MED ORDER — HEPARIN SOD (PORK) LOCK FLUSH 100 UNIT/ML IV SOLN
500.0000 [IU] | Freq: Once | INTRAVENOUS | Status: AC
  Administered 2023-03-07: 500 [IU]

## 2023-03-07 MED ORDER — DULOXETINE HCL 30 MG PO CPEP: ORAL_CAPSULE | ORAL | 2 refills | Status: DC

## 2023-03-07 MED ORDER — FLUCONAZOLE 200 MG PO TABS: 200.0000 mg | ORAL_TABLET | Freq: Every day | ORAL | 0 refills | Status: DC

## 2023-03-07 MED ORDER — PROCHLORPERAZINE MALEATE 10 MG PO TABS: ORAL_TABLET | ORAL | 2 refills | Status: DC

## 2023-03-07 MED ORDER — ONDANSETRON 8 MG PO TBDP: 8.0000 mg | ORAL_TABLET | Freq: Three times a day (TID) | ORAL | 2 refills | Status: DC | PRN

## 2023-03-07 NOTE — Progress Notes (Signed)
[facility] OFFICE PROGRESS NOTE   Diagnosis: Pancreas cancer  INTERVAL HISTORY:   Wendy Hoffman returns for scheduled visit.  She missed the last scheduled treatment at the Cancer center.  She reports adequate pain control with the current narcotic regimen.  She returned from a trip to [place] last weekend.  Objective:  Vital signs in last 24 hours:  Blood pressure 126/80, pulse 100, temperature 98.2 F (36.8 C), temperature source Oral, resp. rate 18, height 5\' 4"  (1.626 m), weight 183 lb (83 kg), SpO2 100 %.    HEENT: No thrush Resp: Lungs clear bilaterally Cardio: Regular rate and rhythm GI: Mild tenderness in the mid and right upper abdomen, no hepatosplenomegaly, no apparent ascites Vascular: No leg edema    Portacath/PICC-without erythema  Lab Results:  Lab Results  Component Value Date   WBC 10.5 03/07/2023   HGB 9.4 (L) 03/07/2023   HCT 28.0 (L) 03/07/2023   MCV 92.4 03/07/2023   PLT 333 03/07/2023   NEUTROABS 8.0 (H) 03/07/2023    CMP  Lab Results  Component Value Date   NA 136 02/07/2023   K 3.7 02/07/2023   CL 97 (L) 02/07/2023   CO2 31 02/07/2023   GLUCOSE 95 02/07/2023   BUN 19 02/07/2023   CREATININE 1.27 (H) 02/07/2023   CALCIUM 10.1 02/07/2023   PROT 6.4 (L) 02/07/2023   ALBUMIN 3.6 02/07/2023   AST 16 02/07/2023   ALT 8 02/07/2023   ALKPHOS 138 (H) 02/07/2023   BILITOT 0.4 02/07/2023   GFRNONAA 50 (L) 02/07/2023   GFRAA >60 07/21/2020    Lab Results  Component Value Date   CEA1 68.8 (H) 02/23/2021   CEA <0.5 10/15/2008   [id number] 22,081 (H) 02/07/2023      Medications: I have reviewed the patient's current medications.   Assessment/Plan: Pancreas cancer -CT abdomen/pelvis without contrast 02/16/2021-multiple large hypoechoic masses in the patient's liver consistent metastatic disease, ill-defined masslike area in the pancreatic head measuring approximate 4.7 cm, possible filling defect in the right ventricle. -EGD  02/19/2021-large infiltrative mass with bleeding in the second portion of the duodenum, appears to be arising near the ampulla, partially obstructive with friable mucosa.  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 -Flexible sigmoidoscopy 02/19/2021-diverticulosis in the sigmoid colon, nonbleeding external and internal hemorrhoids -Cardiac CT 02/20/2021-mass in the mid RV attached to the interventricular septum measuring 16 mm x 6 mm and concerning for metastatic tumor -MRI abdomen with/without contrast 02/21/2021-mass in the posterior pancreatic head measuring 4.3 x 3.8 cm with obstruction of the central pancreatic duct and diffuse dilatation up to 6 mm consistent with pancreatic adenocarcinoma, liver lesions the largest mass of the central left lobe measuring 9.9 x 9.2 cm consistent with hepatic metastatic disease,  hepatomegaly. -Ultrasound-guided biopsy of a left liver lesion 02/24/2021-adenocarcinoma, morphologically similar to the duodenal biopsy; microsatellite stable, tumor mutation burden 1 -Negative genetic testing -Palliative radiation to the duodenal mass 02/25/2021-03/06/2021 -Cycle 1 FOLFOX 03/17/2021 -Cycle 2 FOLFOX 04/01/2021 -Cycle 3 FOLFIRINOX 04/14/2021 -Cycle 4 FOLFIRINOX 04/27/2021, Udenyca added -Cycle 5 FOLFIRINOX 05/12/2021, Udenyca -MRI abdomen 05/23/2021-decrease in size of pancreas mass and hepatic lesions, no progressive disease -Cycle 6 FOLFIRINOX 05/27/2021 -CT 06/17/2021-multiple liver metastases similar to her prior exam.  Pancreas mass not significantly changed. -CT 06/26/2021-stable pancreas mass, no significant change in size and number of known liver metastases. -CT abdomen/pelvis 07/12/2021-new gastrostomy tube.  Multiple liver masses slightly decreased in size from prior exam.  Pancreas head mass unchanged. -Cycle 1 gemcitabine/Abraxane 07/30/2021 -Cycle  2 gemcitabine/Abraxane 08/13/2021 -Cycle 3 gemcitabine 08/27/2021, Abraxane held due to significant  bilateral toe pain, question neuropathy -Cycle 4 gemcitabine, Abraxane held due to neuropathy, 09/10/2021 -Cycle 5 gemcitabine, Abraxane held due to neuropathy, 09/24/2021 -Cycle 6 gemcitabine, Abraxane held due to neuropathy 10/16/2021 -CTs 10/29/2021-mild progression of multifocal liver metastases; mild increase in size of hypoenhancing mass within the head of the pancreas; new mild pleural nodularity along the posterior lower lobes -Cycle 7 gemcitabine/Abraxane 11/03/2021 -Cycle 8 gemcitabine/Abraxane 11/23/2021 -Cycle 9 gemcitabine/Abraxane 12/10/2021 -Cycle 10 Gemcitabine/Abraxane 12/24/2021 -CT abdomen/pelvis 01/04/2022-decrease size of pancreas head mass, no change in multifocal hepatic metastases -Cycle 11 gemcitabine/Abraxane 01/07/2022 -Cycle 12 gemcitabine/Abraxane 02/04/2022 -Cycle 13 Gemcitabine/Abraxane 02/18/2022 -Cycle 14 gemcitabine/Abraxane 03/11/2022 -Cycle 15 gemcitabine/Abraxane 03/29/2022 -Cycle 16 gemcitabine/Abraxane 04/16/2022 -CT abdomen/pelvis 04/22/2022-mixed response in the liver, some lesions larger, some smaller, no change in pancreas head mass, no evidence of metastatic disease elsewhere in the abdomen or pelvis -Cycle 17 gemcitabine/Abraxane 05/17/2022 -Cycle 18 Gemcitabine/Abraxane 06/01/2022 -Cycle 19 gemcitabine/Abraxane 06/18/2022 -Cycle 20 gemcitabine/Abraxane 07/05/2022 -Cycle 21 gemcitabine/Abraxane 08/09/2022 -Cycle 22 gemcitabine/Abraxane 08/23/2022 -CT abdomen/pelvis 09/01/2022-stable liver metastases, difficult to visualize the pancreas head mass, no evidence of disease progression -Cycle 23 gemcitabine/Abraxane 09/06/2022 -Cycle 24 gemcitabine/Abraxane 09/20/2022 -Cycle 25 gemcitabine/Abraxane 10/18/2022 -Cycle 26 gemcitabine/Abraxane 11/01/2022 -Cycle 27 gemcitabine/Abraxane 11/29/2022 -CT abdomen/pelvis 12/14/2022-increased size of liver metastases, less well-defined pancreas mass, 4 mm right lung nodule -Cycle 1 FOLFIRINOX 01/10/2023 -Cycle 2 FOLFIRINOX 01/24/2023,  5-FU dose reduced secondary to mucositis, chemotherapy plan dose reduce for weight loss -Develop pruritus during the oxaliplatin infusion, initial improvement with Benadryl and Solu-Medrol, oxaliplatin was resumed and the pruritus recurred.  Oxaliplatin was discontinued. -Chemotherapy held 02/07/2023 due to diarrhea, nausea/vomiting, dehydration -Cycle 3 FOLFIRINOX 03/07/2023 2.  Iron deficiency anemia-improved -Feraheme 510 mg 02/20/2021 3.  Protein calorie malnutrition 4.  Possible right ventricular filling defect on noncontrast CT scan MRI cardiac morphology 02/21/2021-mass in the mid RV attached to the interventricular septum measures 16 mm x 6 mm.  Mass appears heterogeneous with area of enhancement on LGE imaging.  Mass is concerning for metastatic tumor.  Normal LV size and systolic function.  Normal RV size and systolic function. 5.  Hypertension 6.  History of CVA 7.  Hyperlipidemia 8.  Hypothyroidism 9.  Morbid obesity 10.  Asthma 11.  Migraines 12.  Pain secondary to #1 13.  Port-A-Cath placement 02/25/2021 14.  Hypokalemia 04/01/2021-started a potassium supplement 15.  Hospital admission 06/17/2021-intractable nausea and vomiting EGD 06/19/2021-diffuse gastritis, nonobstructing duodenal mass, biopsy of stomach-no malignancy or H. pylori, duodenal mass-adenocarcinoma 16.  Hospital admission 07/12/2021 with nausea/vomiting, abdominal pain, and hypokalemia 17.  Percutaneous gastrostomy tube placement 07/01/2021 18.  07/21/2022 bilateral lower extremity venous Doppler study-negative for DVT.        Disposition: Wendy Hoffman appears stable.  She will continue the current narcotic regimen.  We refilled medications today. The plan is to proceed with another cycle of FOLFIRINOX tomorrow.  She will receive Decadron premedication today.  The oxaliplatin infusion time will be lengthened and she will receive Solu-Medrol premedication.  She will alert the treating nurse if she develops symptoms of an  allergic reaction.  She understands the chance of developing allergic reaction and agrees to proceed.  She will return for an office visit in the next cycle of FOLFIRINOX in 2 weeks.  Betsy Coder, MD  03/07/2023  11:08 AM

## 2023-03-08 ENCOUNTER — Other Ambulatory Visit (HOSPITAL_BASED_OUTPATIENT_CLINIC_OR_DEPARTMENT_OTHER): Payer: Self-pay

## 2023-03-08 ENCOUNTER — Inpatient Hospital Stay: Payer: Medicaid Other

## 2023-03-08 VITALS — BP 159/82 | HR 60 | Resp 18

## 2023-03-08 DIAGNOSIS — C250 Malignant neoplasm of head of pancreas: Secondary | ICD-10-CM

## 2023-03-08 DIAGNOSIS — Z5111 Encounter for antineoplastic chemotherapy: Secondary | ICD-10-CM | POA: Diagnosis not present

## 2023-03-08 MED ORDER — SODIUM CHLORIDE 0.9 % IV SOLN
150.0000 mg | Freq: Once | INTRAVENOUS | Status: AC
  Administered 2023-03-08: 150 mg via INTRAVENOUS
  Filled 2023-03-08: qty 150

## 2023-03-08 MED ORDER — DEXTROSE 5 % IV SOLN: Freq: Once | INTRAVENOUS | Status: AC

## 2023-03-08 MED ORDER — SODIUM CHLORIDE 0.9 % IV SOLN
100.0000 mg/m2 | Freq: Once | INTRAVENOUS | Status: AC
  Administered 2023-03-08: 200 mg via INTRAVENOUS
  Filled 2023-03-08: qty 10

## 2023-03-08 MED ORDER — ATROPINE SULFATE 1 MG/ML IV SOLN
0.5000 mg | Freq: Once | INTRAVENOUS | Status: AC | PRN
  Administered 2023-03-08: 0.5 mg via INTRAVENOUS
  Filled 2023-03-08: qty 1

## 2023-03-08 MED ORDER — PALONOSETRON HCL INJECTION 0.25 MG/5ML
0.2500 mg | Freq: Once | INTRAVENOUS | Status: AC
  Administered 2023-03-08: 0.25 mg via INTRAVENOUS
  Filled 2023-03-08: qty 5

## 2023-03-08 MED ORDER — METHYLPREDNISOLONE SODIUM SUCC 125 MG IJ SOLR
125.0000 mg | Freq: Once | INTRAMUSCULAR | Status: AC
  Administered 2023-03-08: 125 mg via INTRAVENOUS
  Filled 2023-03-08: qty 2

## 2023-03-08 MED ORDER — SODIUM CHLORIDE 0.9 % IV SOLN
400.0000 mg/m2 | Freq: Once | INTRAVENOUS | Status: AC
  Administered 2023-03-08: 760 mg via INTRAVENOUS
  Filled 2023-03-08 (×2): qty 38

## 2023-03-08 MED ORDER — OXALIPLATIN CHEMO INJECTION 100 MG/20ML
65.0000 mg/m2 | Freq: Once | INTRAVENOUS | Status: AC
  Administered 2023-03-08: 125 mg via INTRAVENOUS
  Filled 2023-03-08: qty 20

## 2023-03-08 MED ORDER — DIPHENHYDRAMINE HCL 50 MG/ML IJ SOLN
25.0000 mg | Freq: Once | INTRAMUSCULAR | Status: AC
  Administered 2023-03-08: 25 mg via INTRAVENOUS
  Filled 2023-03-08: qty 1

## 2023-03-08 MED ORDER — FAMOTIDINE IN NACL 20-0.9 MG/50ML-% IV SOLN
20.0000 mg | Freq: Once | INTRAVENOUS | Status: AC
  Administered 2023-03-08: 20 mg via INTRAVENOUS
  Filled 2023-03-08: qty 50

## 2023-03-08 MED ORDER — SODIUM CHLORIDE 0.9 % IV SOLN
1400.0000 mg/m2 | INTRAVENOUS | Status: DC
  Administered 2023-03-08: 2500 mg via INTRAVENOUS
  Filled 2023-03-08: qty 50

## 2023-03-08 NOTE — Progress Notes (Signed)
Pt here for treatment today rating her pain 7/10 chronic abdominal pain. Pt reports not taking pain medication since last evening. Dr Benay Spice notified that patient is hard to wake to answer questions.

## 2023-03-08 NOTE — Patient Instructions (Addendum)
[place]  The chemotherapy medication bag should finish at 46 hours, 96 hours, or 7 days. For example, if your pump is scheduled for 46 hours and it was put on at 4:00 p.m., it should finish at 2:00 p.m. the day it is scheduled to come off regardless of your appointment time.     Estimated time to finish at 2:30 Thursday, March 10, 2023.   If the display on your pump reads "Low Volume" and it is beeping, take the batteries out of the pump and come to the cancer center for it to be taken off.   If the pump alarms go off prior to the pump reading "Low Volume" then call [phone] and someone can assist you.  If the plunger comes out and the chemotherapy medication is leaking out, please use your home chemo spill kit to clean up the spill. Do NOT use paper towels or other household products.  If you have problems or questions regarding your pump, please call either [phone] (24 hours a day) or the cancer center Monday-Friday 8:00 a.m.- 4:30 p.m. at the clinic number and we will assist you. If you are unable to get assistance, then go to the nearest Emergency Department and ask the staff to contact the IV team for assistance.   Discharge Instructions: Thank you for choosing [facility] to provide your oncology and hematology care.   If you have a lab appointment with the [facility], please go directly to the [facility] and check in at the registration area.   Wear comfortable clothing and clothing appropriate for easy access to any Portacath or PICC line.   We strive to give you quality time with your provider. You may need to reschedule your appointment if you arrive late (15 or more minutes).  Arriving late affects you and other patients whose appointments are after yours.  Also, if you miss three or more appointments without notifying the office, you may be dismissed from the clinic at the provider's discretion.      For  prescription refill requests, have your pharmacy contact our office and allow 72 hours for refills to be completed.    Today you received the following chemotherapy and/or immunotherapy agents Oxaliplatin, Leucovorin, Irinotecan, Fluorouracil.      To help prevent nausea and vomiting after your treatment, we encourage you to take your nausea medication as directed.  BELOW ARE SYMPTOMS THAT SHOULD BE REPORTED IMMEDIATELY: *FEVER GREATER THAN 100.4 F (38 C) OR HIGHER *CHILLS OR SWEATING *NAUSEA AND VOMITING THAT IS NOT CONTROLLED WITH YOUR NAUSEA MEDICATION *UNUSUAL SHORTNESS OF BREATH *UNUSUAL BRUISING OR BLEEDING *URINARY PROBLEMS (pain or burning when urinating, or frequent urination) *BOWEL PROBLEMS (unusual diarrhea, constipation, pain near the anus) TENDERNESS IN MOUTH AND THROAT WITH OR WITHOUT PRESENCE OF ULCERS (sore throat, sores in mouth, or a toothache) UNUSUAL RASH, SWELLING OR PAIN  UNUSUAL VAGINAL DISCHARGE OR ITCHING   Items with * indicate a potential emergency and should be followed up as soon as possible or go to the Emergency Department if any problems should occur.  Please show the CHEMOTHERAPY ALERT CARD or IMMUNOTHERAPY ALERT CARD at check-in to the Emergency Department and triage nurse.  Should you have questions after your visit or need to cancel or reschedule your appointment, please contact [facility]  Dept: [phone]  and follow the prompts.  Office hours are 8:00 a.m. to 4:30 p.m. Monday - Friday. Please  note that voicemails left after 4:00 p.m. may not be returned until the following business day.  We are closed weekends and major holidays. You have access to a nurse at all times for urgent questions. Please call the main number to the clinic Dept: [phone] and follow the prompts.   For any non-urgent questions, you may also contact your provider using MyChart. We now offer e-Visits for anyone 50 and older to request  care online for non-urgent symptoms. For details visit [email or web address].   Also download the MyChart app! Go to the app store, search "MyChart", open the app, select [facility], and log in with your MyChart username and password.  Oxaliplatin Injection What is this medication? OXALIPLATIN (ox AL i PLA tin) treats colorectal cancer. It works by slowing down the growth of cancer cells. This medicine may be used for other purposes; ask your health care provider or pharmacist if you have questions. COMMON BRAND NAME(S): Eloxatin What should I tell my care team before I take this medication? They need to know if you have any of these conditions: Heart disease History of irregular heartbeat or rhythm Liver disease Low blood cell levels (white cells, red cells, and platelets) Lung or breathing disease, such as asthma Take medications that treat or prevent blood clots Tingling of the fingers, toes, or other nerve disorder An unusual or allergic reaction to oxaliplatin, other medications, foods, dyes, or preservatives If you or your partner are pregnant or trying to get pregnant Breast-feeding How should I use this medication? This medication is injected into a vein. It is given by your care team in a hospital or clinic setting. Talk to your care team about the use of this medication in children. Special care may be needed. Overdosage: If you think you have taken too much of this medicine contact a poison control center or emergency room at once. NOTE: This medicine is only for you. Do not share this medicine with others. What if I miss a dose? Keep appointments for follow-up doses. It is important not to miss a dose. Call your care team if you are unable to keep an appointment. What may interact with this medication? Do not take this medication with any of the following: Cisapride Dronedarone Pimozide Thioridazine This medication may also interact with the following: Aspirin and  aspirin-like medications Certain medications that treat or prevent blood clots, such as warfarin, apixaban, dabigatran, and rivaroxaban Cisplatin Cyclosporine Diuretics Medications for infection, such as acyclovir, adefovir, amphotericin B, bacitracin, cidofovir, foscarnet, ganciclovir, gentamicin, pentamidine, vancomycin NSAIDs, medications for pain and inflammation, such as ibuprofen or naproxen Other medications that cause heart rhythm changes Pamidronate Zoledronic acid This list may not describe all possible interactions. Give your health care provider a list of all the medicines, herbs, non-prescription drugs, or dietary supplements you use. Also tell them if you smoke, drink alcohol, or use illegal drugs. Some items may interact with your medicine. What should I watch for while using this medication? Your condition will be monitored carefully while you are receiving this medication. You may need blood work while taking this medication. This medication may make you feel generally unwell. This is not uncommon as chemotherapy can affect healthy cells as well as cancer cells. Report any side effects. Continue your course of treatment even though you feel ill unless your care team tells you to stop. This medication may increase your risk of getting an infection. Call your care team for advice if you get a fever, chills, sore throat,  or other symptoms of a cold or flu. Do not treat yourself. Try to avoid being around people who are sick. Avoid taking medications that contain aspirin, acetaminophen, ibuprofen, naproxen, or ketoprofen unless instructed by your care team. These medications may hide a fever. Be careful brushing or flossing your teeth or using a toothpick because you may get an infection or bleed more easily. If you have any dental work done, tell your dentist you are receiving this medication. This medication can make you more sensitive to cold. Do not drink cold drinks or use ice.  Cover exposed skin before coming in contact with cold temperatures or cold objects. When out in cold weather wear warm clothing and cover your mouth and nose to warm the air that goes into your lungs. Tell your care team if you get sensitive to the cold. Talk to your care team if you or your partner are pregnant or think either of you might be pregnant. This medication can cause serious birth defects if taken during pregnancy and for 9 months after the last dose. A negative pregnancy test is required before starting this medication. A reliable form of contraception is recommended while taking this medication and for 9 months after the last dose. Talk to your care team about effective forms of contraception. Do not father a child while taking this medication and for 6 months after the last dose. Use a condom while having sex during this time period. Do not breastfeed while taking this medication and for 3 months after the last dose. This medication may cause infertility. Talk to your care team if you are concerned about your fertility. What side effects may I notice from receiving this medication? Side effects that you should report to your care team as soon as possible: Allergic reactions--skin rash, itching, hives, swelling of the face, lips, tongue, or throat Bleeding--bloody or black, tar-like stools, vomiting blood or brown material that looks like coffee grounds, red or dark brown urine, small red or purple spots on skin, unusual bruising or bleeding Dry cough, shortness of breath or trouble breathing Heart rhythm changes--fast or irregular heartbeat, dizziness, feeling faint or lightheaded, chest pain, trouble breathing Infection--fever, chills, cough, sore throat, wounds that don't heal, pain or trouble when passing urine, general feeling of discomfort or being unwell Liver injury--right upper belly pain, loss of appetite, nausea, light-colored stool, dark yellow or brown urine, yellowing skin or  eyes, unusual weakness or fatigue Low red blood cell level--unusual weakness or fatigue, dizziness, headache, trouble breathing Muscle injury--unusual weakness or fatigue, muscle pain, dark yellow or brown urine, decrease in amount of urine Pain, tingling, or numbness in the hands or feet Sudden and severe headache, confusion, change in vision, seizures, which may be signs of posterior reversible encephalopathy syndrome (PRES) Unusual bruising or bleeding Side effects that usually do not require medical attention (report to your care team if they continue or are bothersome): Diarrhea Nausea Pain, redness, or swelling with sores inside the mouth or throat Unusual weakness or fatigue Vomiting This list may not describe all possible side effects. Call your doctor for medical advice about side effects. You may report side effects to FDA at 1-800-FDA-1088. Where should I keep my medication? This medication is given in a hospital or clinic. It will not be stored at home. NOTE: This sheet is a summary. It may not cover all possible information. If you have questions about this medicine, talk to your doctor, pharmacist, or health care provider.  2023 Elsevier/Gold  Standard (2008-01-20 00:00:00) Leucovorin Injection What is this medication? LEUCOVORIN (loo koe VOR in) prevents side effects from certain medications, such as methotrexate. It works by increasing folate levels. This helps protect healthy cells in your body. It may also be used to treat anemia caused by low levels of folate. It can also be used with fluorouracil, a type of chemotherapy, to treat colorectal cancer. It works by increasing the effects of fluorouracil in the body. This medicine may be used for other purposes; ask your health care provider or pharmacist if you have questions. What should I tell my care team before I take this medication? They need to know if you have any of these conditions: Anemia from low levels of vitamin  B12 in the blood An unusual or allergic reaction to leucovorin, folic acid, other medications, foods, dyes, or preservatives Pregnant or trying to get pregnant Breastfeeding How should I use this medication? This medication is injected into a vein or a muscle. It is given by your care team in a hospital or clinic setting. Talk to your care team about the use of this medication in children. Special care may be needed. Overdosage: If you think you have taken too much of this medicine contact a poison control center or emergency room at once. NOTE: This medicine is only for you. Do not share this medicine with others. What if I miss a dose? Keep appointments for follow-up doses. It is important not to miss your dose. Call your care team if you are unable to keep an appointment. What may interact with this medication? Capecitabine Fluorouracil Phenobarbital Phenytoin Primidone Trimethoprim;sulfamethoxazole This list may not describe all possible interactions. Give your health care provider a list of all the medicines, herbs, non-prescription drugs, or dietary supplements you use. Also tell them if you smoke, drink alcohol, or use illegal drugs. Some items may interact with your medicine. What should I watch for while using this medication? Your condition will be monitored carefully while you are receiving this medication. This medication may increase the side effects of 5-fluorouracil. Tell your care team if you have diarrhea or mouth sores that do not get better or that get worse. What side effects may I notice from receiving this medication? Side effects that you should report to your care team as soon as possible: Allergic reactions--skin rash, itching, hives, swelling of the face, lips, tongue, or throat This list may not describe all possible side effects. Call your doctor for medical advice about side effects. You may report side effects to FDA at 1-800-FDA-1088. Where should I keep my  medication? This medication is given in a hospital or clinic. It will not be stored at home. NOTE: This sheet is a summary. It may not cover all possible information. If you have questions about this medicine, talk to your doctor, pharmacist, or health care provider.  2023 Elsevier/Gold Standard (2022-04-09 00:00:00)  Irinotecan Injection What is this medication? IRINOTECAN (ir in oh TEE kan) treats some types of cancer. It works by slowing down the growth of cancer cells. This medicine may be used for other purposes; ask your health care provider or pharmacist if you have questions. COMMON BRAND NAME(S): Camptosar What should I tell my care team before I take this medication? They need to know if you have any of these conditions: Dehydration Diarrhea Infection, especially a viral infection, such as chickenpox, cold sores, herpes Liver disease Low blood cell levels (white cells, red cells, and platelets) Low levels of electrolytes, such  as calcium, magnesium, or potassium in your blood Recent or ongoing radiation An unusual or allergic reaction to irinotecan, other medications, foods, dyes, or preservatives If you or your partner are pregnant or trying to get pregnant Breast-feeding How should I use this medication? This medication is injected into a vein. It is given by your care team in a hospital or clinic setting. Talk to your care team about the use of this medication in children. Special care may be needed. Overdosage: If you think you have taken too much of this medicine contact a poison control center or emergency room at once. NOTE: This medicine is only for you. Do not share this medicine with others. What if I miss a dose? Keep appointments for follow-up doses. It is important not to miss your dose. Call your care team if you are unable to keep an appointment. What may interact with this medication? Do not take this medication with any of the  following: Cobicistat Itraconazole This medication may also interact with the following: Certain antibiotics, such as clarithromycin, rifampin, rifabutin Certain antivirals for HIV or AIDS Certain medications for fungal infections, such as ketoconazole, posaconazole, voriconazole Certain medications for seizures, such as carbamazepine, phenobarbital, phenytoin Gemfibrozil Nefazodone St. John's wort This list may not describe all possible interactions. Give your health care provider a list of all the medicines, herbs, non-prescription drugs, or dietary supplements you use. Also tell them if you smoke, drink alcohol, or use illegal drugs. Some items may interact with your medicine. What should I watch for while using this medication? Your condition will be monitored carefully while you are receiving this medication. You may need blood work while taking this medication. This medication may make you feel generally unwell. This is not uncommon as chemotherapy can affect healthy cells as well as cancer cells. Report any side effects. Continue your course of treatment even though you feel ill unless your care team tells you to stop. This medication can cause serious side effects. To reduce the risk, your care team may give you other medications to take before receiving this one. Be sure to follow the directions from your care team. This medication may affect your coordination, reaction time, or judgement. Do not drive or operate machinery until you know how this medication affects you. Sit up or stand slowly to reduce the risk of dizzy or fainting spells. Drinking alcohol with this medication can increase the risk of these side effects. This medication may increase your risk of getting an infection. Call your care team for advice if you get a fever, chills, sore throat, or other symptoms of a cold or flu. Do not treat yourself. Try to avoid being around people who are sick. Avoid taking medications that  contain aspirin, acetaminophen, ibuprofen, naproxen, or ketoprofen unless instructed by your care team. These medications may hide a fever. This medication may increase your risk to bruise or bleed. Call your care team if you notice any unusual bleeding. Be careful brushing or flossing your teeth or using a toothpick because you may get an infection or bleed more easily. If you have any dental work done, tell your dentist you are receiving this medication. Talk to your care team if you or your partner are pregnant or think either of you might be pregnant. This medication can cause serious birth defects if taken during pregnancy and for 6 months after the last dose. You will need a negative pregnancy test before starting this medication. Contraception is recommended while taking  this medication and for 6 months after the last dose. Your care team can help you find the option that works for you. Do not father a child while taking this medication and for 3 months after the last dose. Use a condom for contraception during this time period. Do not breastfeed while taking this medication and for 7 days after the last dose. This medication may cause infertility. Talk to your care team if you are concerned about your fertility. What side effects may I notice from receiving this medication? Side effects that you should report to your care team as soon as possible: Allergic reactions--skin rash, itching, hives, swelling of the face, lips, tongue, or throat Dry cough, shortness of breath or trouble breathing Increased saliva or tears, increased sweating, stomach cramping, diarrhea, small pupils, unusual weakness or fatigue, slow heartbeat Infection--fever, chills, cough, sore throat, wounds that don't heal, pain or trouble when passing urine, general feeling of discomfort or being unwell Kidney injury--decrease in the amount of urine, swelling of the ankles, hands, or feet Low red blood cell level--unusual  weakness or fatigue, dizziness, headache, trouble breathing Severe or prolonged diarrhea Unusual bruising or bleeding Side effects that usually do not require medical attention (report to your care team if they continue or are bothersome): Constipation Diarrhea Hair loss Loss of appetite Nausea Stomach pain This list may not describe all possible side effects. Call your doctor for medical advice about side effects. You may report side effects to FDA at 1-800-FDA-1088. Where should I keep my medication? This medication is given in a hospital or clinic. It will not be stored at home. NOTE: This sheet is a summary. It may not cover all possible information. If you have questions about this medicine, talk to your doctor, pharmacist, or health care provider.  2023 Elsevier/Gold Standard (2022-04-08 00:00:00) Fluorouracil Injection What is this medication? FLUOROURACIL (flure oh YOOR a sil) treats some types of cancer. It works by slowing down the growth of cancer cells. This medicine may be used for other purposes; ask your health care provider or pharmacist if you have questions. COMMON BRAND NAME(S): Adrucil What should I tell my care team before I take this medication? They need to know if you have any of these conditions: Blood disorders Dihydropyrimidine dehydrogenase (DPD) deficiency Infection, such as chickenpox, cold sores, herpes Kidney disease Liver disease Poor nutrition Recent or ongoing radiation therapy An unusual or allergic reaction to fluorouracil, other medications, foods, dyes, or preservatives If you or your partner are pregnant or trying to get pregnant Breast-feeding How should I use this medication? This medication is injected into a vein. It is administered by your care team in a hospital or clinic setting. Talk to your care team about the use of this medication in children. Special care may be needed. Overdosage: If you think you have taken too much of this  medicine contact a poison control center or emergency room at once. NOTE: This medicine is only for you. Do not share this medicine with others. What if I miss a dose? Keep appointments for follow-up doses. It is important not to miss your dose. Call your care team if you are unable to keep an appointment. What may interact with this medication? Do not take this medication with any of the following: Live virus vaccines This medication may also interact with the following: Medications that treat or prevent blood clots, such as warfarin, enoxaparin, dalteparin This list may not describe all possible interactions. Give your health care  provider a list of all the medicines, herbs, non-prescription drugs, or dietary supplements you use. Also tell them if you smoke, drink alcohol, or use illegal drugs. Some items may interact with your medicine. What should I watch for while using this medication? Your condition will be monitored carefully while you are receiving this medication. This medication may make you feel generally unwell. This is not uncommon as chemotherapy can affect healthy cells as well as cancer cells. Report any side effects. Continue your course of treatment even though you feel ill unless your care team tells you to stop. In some cases, you may be given additional medications to help with side effects. Follow all directions for their use. This medication may increase your risk of getting an infection. Call your care team for advice if you get a fever, chills, sore throat, or other symptoms of a cold or flu. Do not treat yourself. Try to avoid being around people who are sick. This medication may increase your risk to bruise or bleed. Call your care team if you notice any unusual bleeding. Be careful brushing or flossing your teeth or using a toothpick because you may get an infection or bleed more easily. If you have any dental work done, tell your dentist you are receiving this  medication. Avoid taking medications that contain aspirin, acetaminophen, ibuprofen, naproxen, or ketoprofen unless instructed by your care team. These medications may hide a fever. Do not treat diarrhea with over the counter products. Contact your care team if you have diarrhea that lasts more than 2 days or if it is severe and watery. This medication can make you more sensitive to the sun. Keep out of the sun. If you cannot avoid being in the sun, wear protective clothing and sunscreen. Do not use sun lamps, tanning beds, or tanning booths. Talk to your care team if you or your partner wish to become pregnant or think you might be pregnant. This medication can cause serious birth defects if taken during pregnancy and for 3 months after the last dose. A reliable form of contraception is recommended while taking this medication and for 3 months after the last dose. Talk to your care team about effective forms of contraception. Do not father a child while taking this medication and for 3 months after the last dose. Use a condom while having sex during this time period. Do not breastfeed while taking this medication. This medication may cause infertility. Talk to your care team if you are concerned about your fertility. What side effects may I notice from receiving this medication? Side effects that you should report to your care team as soon as possible: Allergic reactions--skin rash, itching, hives, swelling of the face, lips, tongue, or throat Heart attack--pain or tightness in the chest, shoulders, arms, or jaw, nausea, shortness of breath, cold or clammy skin, feeling faint or lightheaded Heart failure--shortness of breath, swelling of the ankles, feet, or hands, sudden weight gain, unusual weakness or fatigue Heart rhythm changes--fast or irregular heartbeat, dizziness, feeling faint or lightheaded, chest pain, trouble breathing High ammonia level--unusual weakness or fatigue, confusion, loss of  appetite, nausea, vomiting, seizures Infection--fever, chills, cough, sore throat, wounds that don't heal, pain or trouble when passing urine, general feeling of discomfort or being unwell Low red blood cell level--unusual weakness or fatigue, dizziness, headache, trouble breathing Pain, tingling, or numbness in the hands or feet, muscle weakness, change in vision, confusion or trouble speaking, loss of balance or coordination, trouble walking, seizures Redness,  swelling, and blistering of the skin over hands and feet Severe or prolonged diarrhea Unusual bruising or bleeding Side effects that usually do not require medical attention (report to your care team if they continue or are bothersome): Dry skin Headache Increased tears Nausea Pain, redness, or swelling with sores inside the mouth or throat Sensitivity to light Vomiting This list may not describe all possible side effects. Call your doctor for medical advice about side effects. You may report side effects to FDA at 1-800-FDA-1088. Where should I keep my medication? This medication is given in a hospital or clinic. It will not be stored at home. NOTE: This sheet is a summary. It may not cover all possible information. If you have questions about this medicine, talk to your doctor, pharmacist, or health care provider.  2023 Elsevier/Gold Standard (2022-03-30 00:00:00)

## 2023-03-08 NOTE — Progress Notes (Signed)
This RN notified by Dr Benay Spice to monitor patients lethargy and pain.

## 2023-03-09 LAB — CANCER ANTIGEN 19-9: CA 19-9: 18598 U/mL — ABNORMAL HIGH (ref 0–35)

## 2023-03-09 IMAGING — CT CT ABD-PELV W/O CM
2 of 4 series · 16 of 46 positions shown, 18 images · non-contrast
Comparison: 05/23/2021

CLINICAL DATA: Rule out bowel obstruction. History of pancreas
cancer.

EXAM:
CT ABDOMEN AND PELVIS WITHOUT CONTRAST
TECHNIQUE: Multidetector CT imaging of the abdomen and pelvis was performed
following the standard protocol without IV contrast.

[Series 2: axial st · axial · 0.67mm/px · z∈[-603,-168]mm · 13 of 101 slices shown, 15 images]
[im 7/101  soft-tissue]
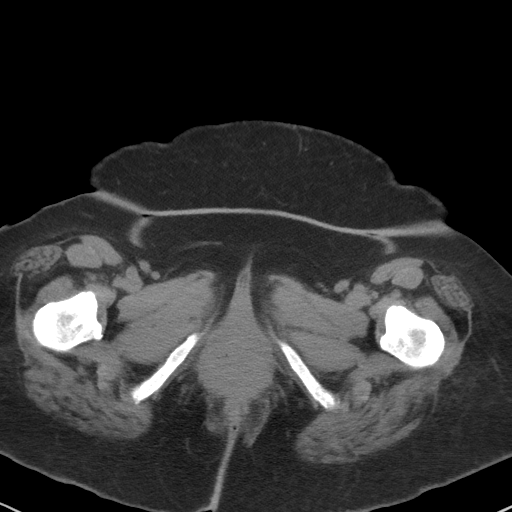
[im 7/101  bone]
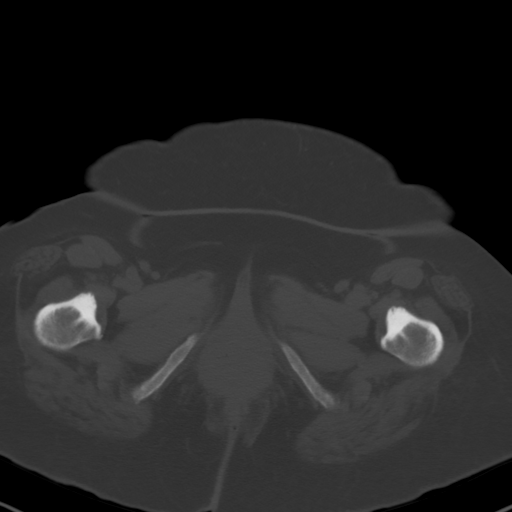
[im 13/101  soft-tissue]
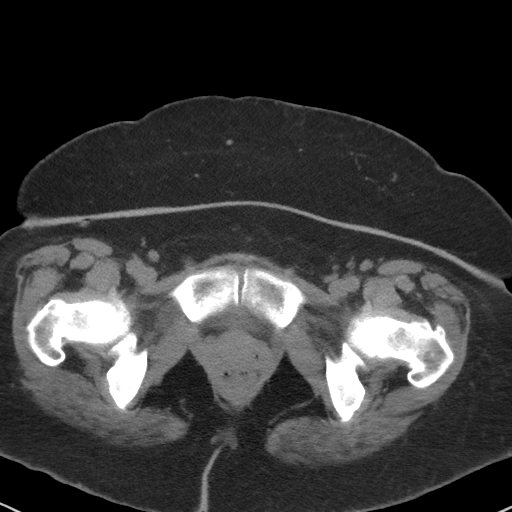
[im 19/101  soft-tissue]
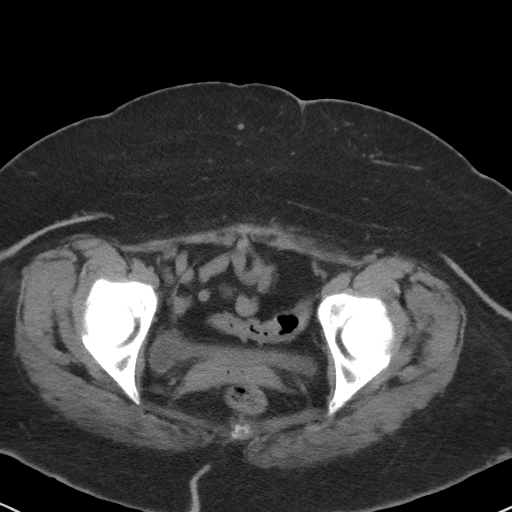
[im 32/101  soft-tissue]
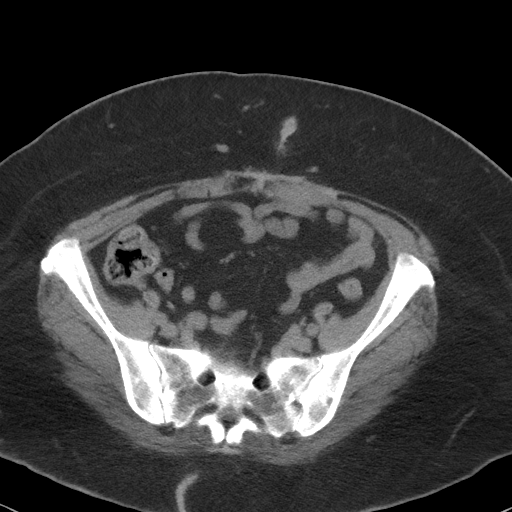
[im 38/101  soft-tissue]
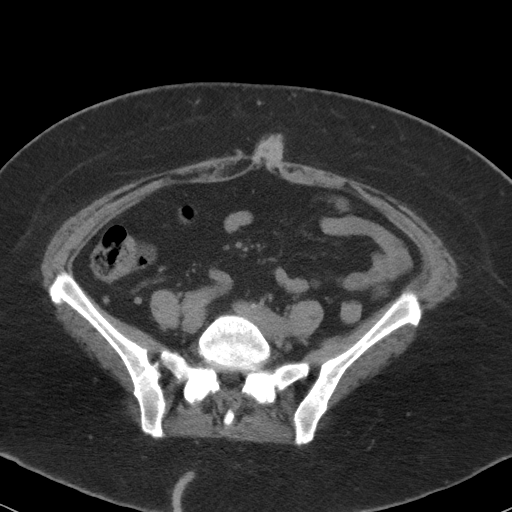
[im 44/101  soft-tissue]
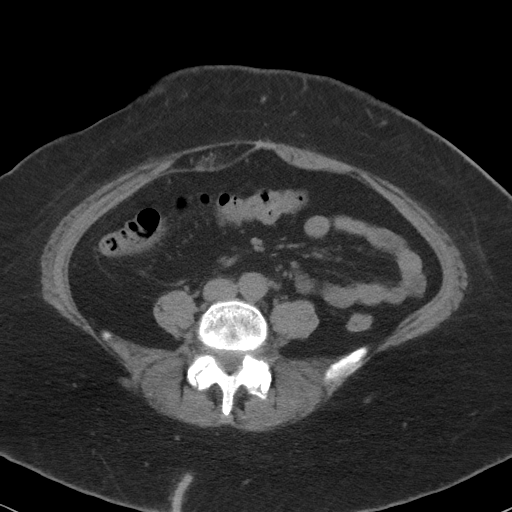
[im 51/101  soft-tissue]
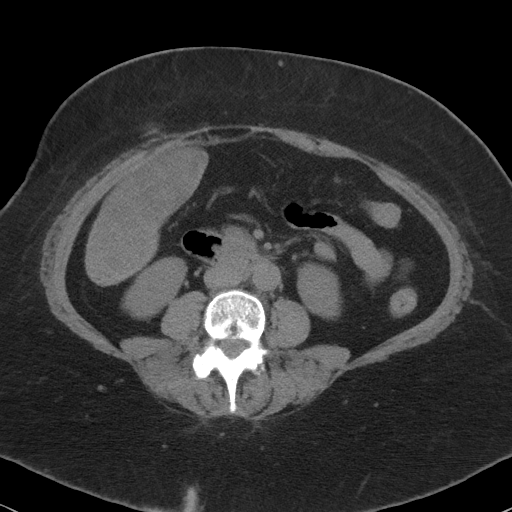
[im 57/101  soft-tissue]
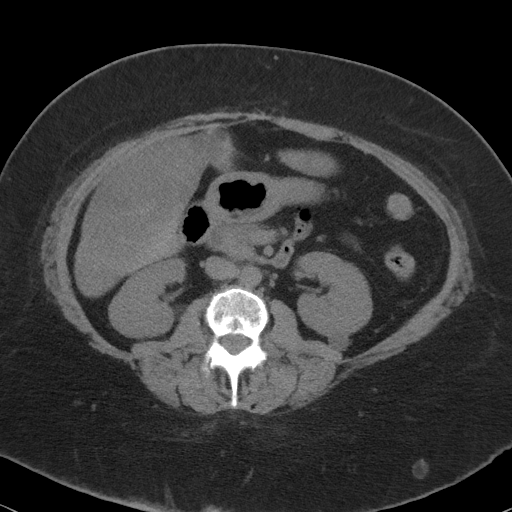
[im 63/101  soft-tissue]
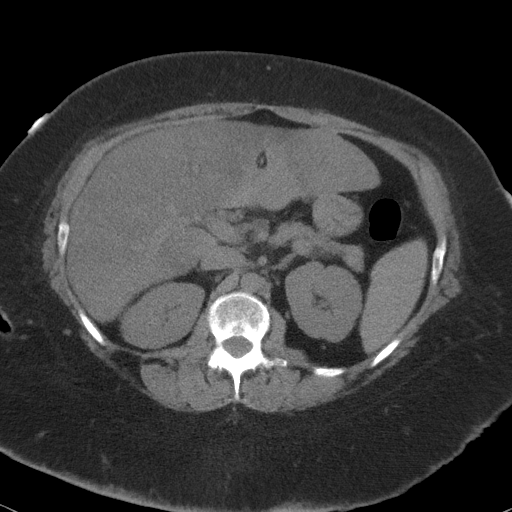
[im 63/101  bone]
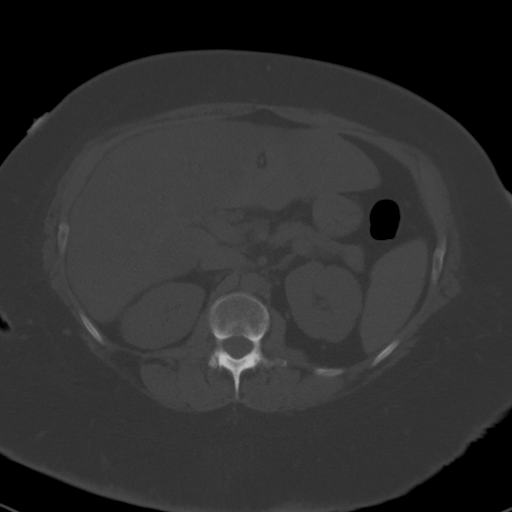
[im 69/101  soft-tissue]
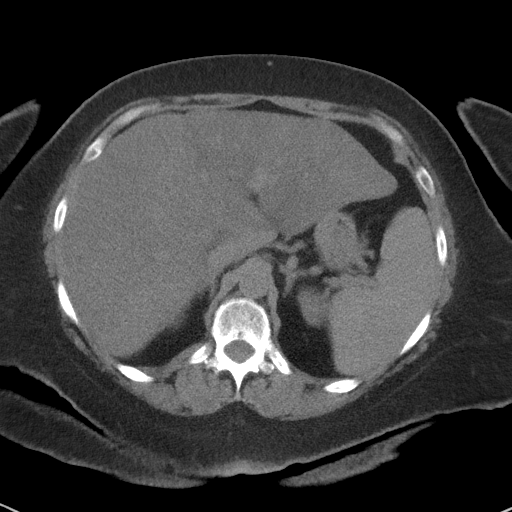
[im 82/101  soft-tissue]
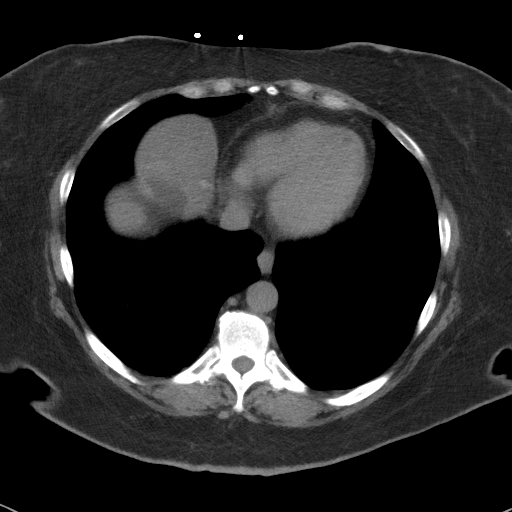
[im 88/101  soft-tissue]
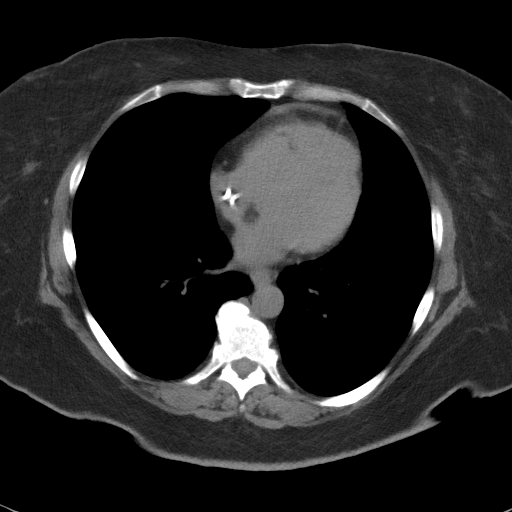
[im 94/101  soft-tissue]
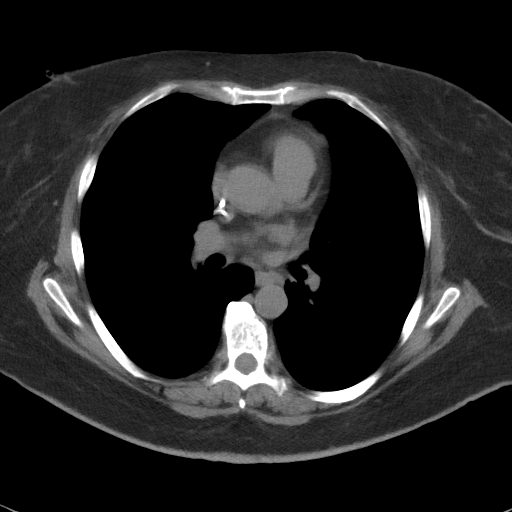

[Series 5: coronal st · coronal · 0.85mm/px · 3 of 101 slices shown]
[im 34/101  soft-tissue]
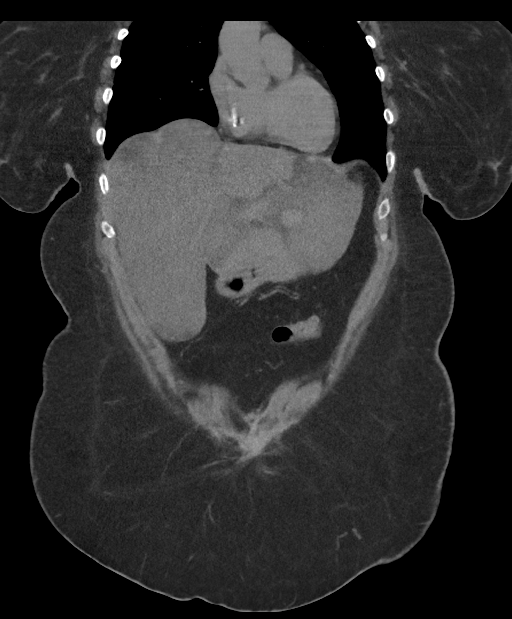
[im 45/101  soft-tissue]
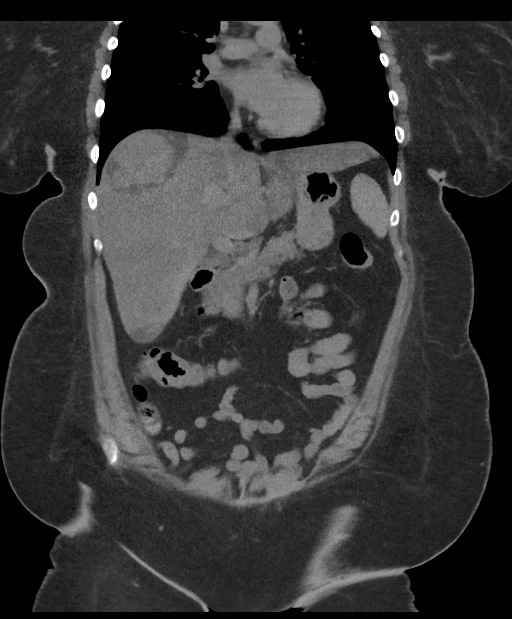
[im 56/101  soft-tissue]
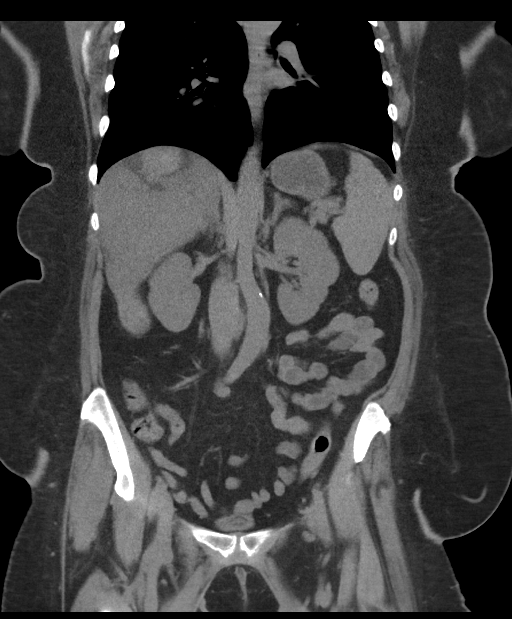

[16 of 46 positions shown; findings below may reference images not displayed]

FINDINGS: Lower chest: No acute abnormality.

Hepatobiliary: Within a background of diffuse hepatic steatosis
there are multiple liver metastases as noted previously:

The dominant lesion within the lateral segment of left hepatic lobe
measures 7.8 cm, image [DATE]. Previously this measured the same.

The dominant lesion within the right hepatic lobe measures 8.2 cm,
image [DATE]. Previously 8 cm.

Status post cholecystectomy. No biliary dilatation.

Pancreas: Head of pancreas mass is suboptimally evaluated due to
lack of IV contrast material. Subjectively, this does not appear
significantly changed in size when compared with the previous
contrast enhanced MRI.

Spleen: Normal in size without focal abnormality.

Adrenals/Urinary Tract: Normal adrenal glands. No hydronephrosis
identified bilaterally. Punctate stone noted within inferior pole of
left kidney. Exophytic cyst is again noted arising off the posterior
cortex of the interpolar right kidney measuring 1.3 cm. Urinary
bladder appears collapsed.

Stomach/Bowel: Stomach is nondistended. The appendix is visualized
and appears normal. No pathologic dilatation of the large or small
bowel loops. No significant bowel wall thickening or surrounding
inflammatory fat stranding.

Vascular/Lymphatic: Mild aortic atherosclerosis. No aneurysm. No
abdominopelvic adenopathy identified.

Reproductive: Status post hysterectomy. No adnexal masses.  The

Other: No ascites or focal fluid collections. No peritoneal
nodularity identified at this time.

Musculoskeletal: No acute or significant osseous findings.
IMPRESSION: 1. No acute findings within the abdomen or pelvis. No evidence for
bowel obstruction.
2. Multiple liver metastases.  Similar to previous exam.
3. Head of pancreas mass is suboptimally evaluated due to lack of IV
contrast material. Subjectively, this does not appear significantly
changed in size when compared with the previous contrast enhanced
MRI.
4. Aortic atherosclerosis.

Aortic Atherosclerosis ([id number].X).

## 2023-03-10 ENCOUNTER — Inpatient Hospital Stay: Payer: Medicaid Other

## 2023-03-10 ENCOUNTER — Other Ambulatory Visit (HOSPITAL_BASED_OUTPATIENT_CLINIC_OR_DEPARTMENT_OTHER): Payer: Self-pay

## 2023-03-10 VITALS — BP 126/84 | HR 67 | Temp 98.1°F | Resp 18

## 2023-03-10 DIAGNOSIS — C250 Malignant neoplasm of head of pancreas: Secondary | ICD-10-CM

## 2023-03-10 DIAGNOSIS — Z5111 Encounter for antineoplastic chemotherapy: Secondary | ICD-10-CM | POA: Diagnosis not present

## 2023-03-10 MED ORDER — SODIUM CHLORIDE 0.9% FLUSH
10.0000 mL | INTRAVENOUS | Status: DC | PRN
  Administered 2023-03-10: 10 mL

## 2023-03-10 MED ORDER — HEPARIN SOD (PORK) LOCK FLUSH 100 UNIT/ML IV SOLN
500.0000 [IU] | Freq: Once | INTRAVENOUS | Status: AC | PRN
  Administered 2023-03-10: 500 [IU]

## 2023-03-10 MED ORDER — PEGFILGRASTIM-CBQV 6 MG/0.6ML ~~LOC~~ SOSY
6.0000 mg | PREFILLED_SYRINGE | Freq: Once | SUBCUTANEOUS | Status: AC
  Administered 2023-03-10: 6 mg via SUBCUTANEOUS
  Filled 2023-03-10: qty 0.6

## 2023-03-10 NOTE — Patient Instructions (Signed)

## 2023-03-15 ENCOUNTER — Other Ambulatory Visit: Payer: Self-pay

## 2023-03-15 ENCOUNTER — Other Ambulatory Visit: Payer: Self-pay | Admitting: Nurse Practitioner

## 2023-03-15 ENCOUNTER — Telehealth: Payer: Self-pay

## 2023-03-15 DIAGNOSIS — B3731 Acute candidiasis of vulva and vagina: Secondary | ICD-10-CM

## 2023-03-15 DIAGNOSIS — C250 Malignant neoplasm of head of pancreas: Secondary | ICD-10-CM

## 2023-03-15 MED ORDER — FLUCONAZOLE 150 MG PO TABS: 150.0000 mg | ORAL_TABLET | Freq: Once | ORAL | 0 refills | Status: AC

## 2023-03-15 NOTE — Telephone Encounter (Signed)
The patient contacted our office to report symptoms of burning and itching in the perineal area, without any associated odor. She suspects it may be a yeast infection and is requesting a prescription be called in to address these symptoms.

## 2023-03-20 ENCOUNTER — Other Ambulatory Visit: Payer: Self-pay | Admitting: Oncology

## 2023-03-21 ENCOUNTER — Other Ambulatory Visit (HOSPITAL_BASED_OUTPATIENT_CLINIC_OR_DEPARTMENT_OTHER): Payer: Self-pay

## 2023-03-21 ENCOUNTER — Ambulatory Visit (HOSPITAL_BASED_OUTPATIENT_CLINIC_OR_DEPARTMENT_OTHER)
Admission: RE | Admit: 2023-03-21 | Discharge: 2023-03-21 | Disposition: A | Discharge status: Home / Self Care | Payer: Medicaid Other | Source: Ambulatory Visit | Attending: Nurse Practitioner | Admitting: Nurse Practitioner

## 2023-03-21 ENCOUNTER — Inpatient Hospital Stay (HOSPITAL_BASED_OUTPATIENT_CLINIC_OR_DEPARTMENT_OTHER): Payer: Medicaid Other | Admitting: Nurse Practitioner

## 2023-03-21 ENCOUNTER — Encounter: Payer: Self-pay | Admitting: Oncology

## 2023-03-21 ENCOUNTER — Inpatient Hospital Stay: Payer: Medicaid Other | Attending: Nurse Practitioner

## 2023-03-21 ENCOUNTER — Inpatient Hospital Stay: Payer: Medicaid Other

## 2023-03-21 ENCOUNTER — Encounter: Payer: Self-pay | Admitting: Nurse Practitioner

## 2023-03-21 VITALS — BP 132/90 | HR 76 | Temp 98.2°F | Resp 20 | Ht 64.0 in | Wt 172.0 lb

## 2023-03-21 DIAGNOSIS — Z5189 Encounter for other specified aftercare: Secondary | ICD-10-CM | POA: Insufficient documentation

## 2023-03-21 DIAGNOSIS — C259 Malignant neoplasm of pancreas, unspecified: Secondary | ICD-10-CM

## 2023-03-21 DIAGNOSIS — E876 Hypokalemia: Secondary | ICD-10-CM | POA: Diagnosis not present

## 2023-03-21 DIAGNOSIS — G43909 Migraine, unspecified, not intractable, without status migrainosus: Secondary | ICD-10-CM | POA: Insufficient documentation

## 2023-03-21 DIAGNOSIS — Z5111 Encounter for antineoplastic chemotherapy: Secondary | ICD-10-CM | POA: Insufficient documentation

## 2023-03-21 DIAGNOSIS — C787 Secondary malignant neoplasm of liver and intrahepatic bile duct: Secondary | ICD-10-CM | POA: Diagnosis not present

## 2023-03-21 DIAGNOSIS — C250 Malignant neoplasm of head of pancreas: Secondary | ICD-10-CM | POA: Insufficient documentation

## 2023-03-21 DIAGNOSIS — E785 Hyperlipidemia, unspecified: Secondary | ICD-10-CM | POA: Diagnosis not present

## 2023-03-21 DIAGNOSIS — E46 Unspecified protein-calorie malnutrition: Secondary | ICD-10-CM | POA: Diagnosis not present

## 2023-03-21 DIAGNOSIS — K1230 Oral mucositis (ulcerative), unspecified: Secondary | ICD-10-CM | POA: Insufficient documentation

## 2023-03-21 DIAGNOSIS — D509 Iron deficiency anemia, unspecified: Secondary | ICD-10-CM | POA: Diagnosis not present

## 2023-03-21 DIAGNOSIS — J45909 Unspecified asthma, uncomplicated: Secondary | ICD-10-CM | POA: Insufficient documentation

## 2023-03-21 DIAGNOSIS — Z95828 Presence of other vascular implants and grafts: Secondary | ICD-10-CM

## 2023-03-21 DIAGNOSIS — Z8673 Personal history of transient ischemic attack (TIA), and cerebral infarction without residual deficits: Secondary | ICD-10-CM | POA: Diagnosis not present

## 2023-03-21 DIAGNOSIS — G629 Polyneuropathy, unspecified: Secondary | ICD-10-CM | POA: Diagnosis not present

## 2023-03-21 DIAGNOSIS — I10 Essential (primary) hypertension: Secondary | ICD-10-CM | POA: Insufficient documentation

## 2023-03-21 DIAGNOSIS — E039 Hypothyroidism, unspecified: Secondary | ICD-10-CM | POA: Diagnosis not present

## 2023-03-21 LAB — CMP (CANCER CENTER ONLY)
ALT: 7 U/L (ref 0–44)
AST: 11 U/L — ABNORMAL LOW (ref 15–41)
Albumin: 3.4 g/dL — ABNORMAL LOW (ref 3.5–5.0)
Alkaline Phosphatase: 162 U/L — ABNORMAL HIGH (ref 38–126)
Anion gap: 10 (ref 5–15)
BUN: 14 mg/dL (ref 6–20)
CO2: 29 mmol/L (ref 22–32)
Calcium: 9.6 mg/dL (ref 8.9–10.3)
Chloride: 99 mmol/L (ref 98–111)
Creatinine: 0.98 mg/dL (ref 0.44–1.00)
GFR, Estimated: >60 mL/min (ref >60)
Glucose, Bld: 97 mg/dL (ref 70–99)
Potassium: 3.6 mmol/L (ref 3.5–5.1)
Sodium: 138 mmol/L (ref 135–145)
Total Bilirubin: 0.4 mg/dL (ref 0.3–1.2)
Total Protein: 6.1 g/dL — ABNORMAL LOW (ref 6.5–8.1)

## 2023-03-21 LAB — CBC WITH DIFFERENTIAL (CANCER CENTER ONLY)
Abs Immature Granulocytes: 0.19 10*3/uL — ABNORMAL HIGH (ref 0.00–0.07)
Basophils Absolute: 0 10*3/uL (ref 0.0–0.1)
Basophils Relative: 0 %
Eosinophils Absolute: 0.1 10*3/uL (ref 0.0–0.5)
Eosinophils Relative: 1 %
HCT: 33 % — ABNORMAL LOW (ref 36.0–46.0)
Hemoglobin: 10.4 g/dL — ABNORMAL LOW (ref 12.0–15.0)
Immature Granulocytes: 2 %
Lymphocytes Relative: 12 %
Lymphs Abs: 1.4 10*3/uL (ref 0.7–4.0)
MCH: 29.9 pg (ref 26.0–34.0)
MCHC: 31.5 g/dL (ref 30.0–36.0)
MCV: 94.8 fL (ref 80.0–100.0)
Monocytes Absolute: 0.7 10*3/uL (ref 0.1–1.0)
Monocytes Relative: 6 %
Neutro Abs: 9.5 10*3/uL — ABNORMAL HIGH (ref 1.7–7.7)
Neutrophils Relative %: 79 %
Platelet Count: 347 10*3/uL (ref 150–400)
RBC: 3.48 MIL/uL — ABNORMAL LOW (ref 3.87–5.11)
RDW: 19.1 % — ABNORMAL HIGH (ref 11.5–15.5)
WBC Count: 11.9 10*3/uL — ABNORMAL HIGH (ref 4.0–10.5)
nRBC: 0 % (ref 0.0–0.2)

## 2023-03-21 MED ORDER — HEPARIN SOD (PORK) LOCK FLUSH 100 UNIT/ML IV SOLN
500.0000 [IU] | Freq: Once | INTRAVENOUS | Status: AC
  Administered 2023-03-21: 500 [IU]

## 2023-03-21 MED ORDER — MORPHINE SULFATE (CONCENTRATE) 10 MG/0.5ML PO SOLN
20.0000 mg | Freq: Three times a day (TID) | ORAL | 0 refills | Status: DC | PRN
Start: 2023-03-21 — End: 2023-04-04

## 2023-03-21 MED ORDER — SODIUM CHLORIDE 0.9% FLUSH
10.0000 mL | Freq: Once | INTRAVENOUS | Status: AC
  Administered 2023-03-21: 10 mL

## 2023-03-21 NOTE — Addendum Note (Signed)
Addended by: Thornton Papas B on: 03/21/2023 04:30 PM   Modules accepted: Orders

## 2023-03-21 NOTE — Progress Notes (Signed)
[facility] OFFICE PROGRESS NOTE   Diagnosis: Pancreas cancer  INTERVAL HISTORY:   Wendy Hoffman returns for follow-up.  She completed cycle 3 FOLFIRINOX 03/07/2023.  She again had nausea/vomiting for several days after treatment.  She had a few mouth sores which have nearly completely resolved.  Bowels alternate constipation/diarrhea.  Intermittent numbness/tingling in the hands.  She developed pruritus with small bumps on her chest and arms after leaving the office day 1.  She took Benadryl with resolution.  She fell about a month ago.  She continues to have right leg pain.  Objective:  Vital signs in last 24 hours:  Blood pressure (!) 132/90, pulse 76, temperature 98.2 F (36.8 C), temperature source Oral, resp. rate 20, height 5\' 4"  (1.626 m), weight 172 lb (78 kg), SpO2 96 %.    HEENT: No thrush or ulcers. Resp: Lungs clear bilaterally. Cardio: Regular rate and rhythm. GI: Upper abdomen is tender.  No hepatosplenomegaly. Vascular: No leg edema. Neuro: Mild decrease in vibratory sense over the fingertips per tuning fork exam. Skin: No rash.  Linear ecchymosis right upper lateral leg. Port-A-Cath without erythema.  Lab Results:  Lab Results  Component Value Date   WBC 11.9 (H) 03/21/2023   HGB 10.4 (L) 03/21/2023   HCT 33.0 (L) 03/21/2023   MCV 94.8 03/21/2023   PLT 347 03/21/2023   NEUTROABS 9.5 (H) 03/21/2023    Imaging:  No results found.  Medications: I have reviewed the patient's current medications.  Assessment/Plan: Pancreas cancer -CT abdomen/pelvis without contrast 02/16/2021-multiple large hypoechoic masses in the patient's liver consistent metastatic disease, ill-defined masslike area in the pancreatic head measuring approximate 4.7 cm, possible filling defect in the right ventricle. -EGD 02/19/2021-large infiltrative mass with bleeding in the second portion of the duodenum, appears to be arising near the ampulla, partially obstructive with  friable mucosa.  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 -Flexible sigmoidoscopy 02/19/2021-diverticulosis in the sigmoid colon, nonbleeding external and internal hemorrhoids -Cardiac CT 02/20/2021-mass in the mid RV attached to the interventricular septum measuring 16 mm x 6 mm and concerning for metastatic tumor -MRI abdomen with/without contrast 02/21/2021-mass in the posterior pancreatic head measuring 4.3 x 3.8 cm with obstruction of the central pancreatic duct and diffuse dilatation up to 6 mm consistent with pancreatic adenocarcinoma, liver lesions the largest mass of the central left lobe measuring 9.9 x 9.2 cm consistent with hepatic metastatic disease,  hepatomegaly. -Ultrasound-guided biopsy of a left liver lesion 02/24/2021-adenocarcinoma, morphologically similar to the duodenal biopsy; microsatellite stable, tumor mutation burden 1 -Negative genetic testing -Palliative radiation to the duodenal mass 02/25/2021-03/06/2021 -Cycle 1 FOLFOX 03/17/2021 -Cycle 2 FOLFOX 04/01/2021 -Cycle 3 FOLFIRINOX 04/14/2021 -Cycle 4 FOLFIRINOX 04/27/2021, Udenyca added -Cycle 5 FOLFIRINOX 05/12/2021, Udenyca -MRI abdomen 05/23/2021-decrease in size of pancreas mass and hepatic lesions, no progressive disease -Cycle 6 FOLFIRINOX 05/27/2021 -CT 06/17/2021-multiple liver metastases similar to her prior exam.  Pancreas mass not significantly changed. -CT 06/26/2021-stable pancreas mass, no significant change in size and number of known liver metastases. -CT abdomen/pelvis 07/12/2021-new gastrostomy tube.  Multiple liver masses slightly decreased in size from prior exam.  Pancreas head mass unchanged. -Cycle 1 gemcitabine/Abraxane 07/30/2021 -Cycle 2 gemcitabine/Abraxane 08/13/2021 -Cycle 3 gemcitabine 08/27/2021, Abraxane held due to significant bilateral toe pain, question neuropathy -Cycle 4 gemcitabine, Abraxane held due to neuropathy, 09/10/2021 -Cycle 5 gemcitabine, Abraxane held due to  neuropathy, 09/24/2021 -Cycle 6 gemcitabine, Abraxane held due to neuropathy 10/16/2021 -CTs 10/29/2021-mild progression of multifocal liver metastases; mild increase in size  of hypoenhancing mass within the head of the pancreas; new mild pleural nodularity along the posterior lower lobes -Cycle 7 gemcitabine/Abraxane 11/03/2021 -Cycle 8 gemcitabine/Abraxane 11/23/2021 -Cycle 9 gemcitabine/Abraxane 12/10/2021 -Cycle 10 Gemcitabine/Abraxane 12/24/2021 -CT abdomen/pelvis 01/04/2022-decrease size of pancreas head mass, no change in multifocal hepatic metastases -Cycle 11 gemcitabine/Abraxane 01/07/2022 -Cycle 12 gemcitabine/Abraxane 02/04/2022 -Cycle 13 Gemcitabine/Abraxane 02/18/2022 -Cycle 14 gemcitabine/Abraxane 03/11/2022 -Cycle 15 gemcitabine/Abraxane 03/29/2022 -Cycle 16 gemcitabine/Abraxane 04/16/2022 -CT abdomen/pelvis 04/22/2022-mixed response in the liver, some lesions larger, some smaller, no change in pancreas head mass, no evidence of metastatic disease elsewhere in the abdomen or pelvis -Cycle 17 gemcitabine/Abraxane 05/17/2022 -Cycle 18 Gemcitabine/Abraxane 06/01/2022 -Cycle 19 gemcitabine/Abraxane 06/18/2022 -Cycle 20 gemcitabine/Abraxane 07/05/2022 -Cycle 21 gemcitabine/Abraxane 08/09/2022 -Cycle 22 gemcitabine/Abraxane 08/23/2022 -CT abdomen/pelvis 09/01/2022-stable liver metastases, difficult to visualize the pancreas head mass, no evidence of disease progression -Cycle 23 gemcitabine/Abraxane 09/06/2022 -Cycle 24 gemcitabine/Abraxane 09/20/2022 -Cycle 25 gemcitabine/Abraxane 10/18/2022 -Cycle 26 gemcitabine/Abraxane 11/01/2022 -Cycle 27 gemcitabine/Abraxane 11/29/2022 -CT abdomen/pelvis 12/14/2022-increased size of liver metastases, less well-defined pancreas mass, 4 mm right lung nodule -Cycle 1 FOLFIRINOX 01/10/2023 -Cycle 2 FOLFIRINOX 01/24/2023, 5-FU dose reduced secondary to mucositis, chemotherapy plan dose reduce for weight loss -Develop pruritus during the oxaliplatin infusion, initial  improvement with Benadryl and Solu-Medrol, oxaliplatin was resumed and the pruritus recurred.  Oxaliplatin was discontinued. -Chemotherapy held 02/07/2023 due to diarrhea, nausea/vomiting, dehydration -Cycle 3 FOLFIRINOX 03/07/2023 -Cycle 4 FOLFIRINOX 03/22/2023, 5-FU dose reduction due to mucositis, chemotherapy plan dose reduction for weight loss 2.  Iron deficiency anemia-improved -Feraheme 510 mg 02/20/2021 3.  Protein calorie malnutrition 4.  Possible right ventricular filling defect on noncontrast CT scan MRI cardiac morphology 02/21/2021-mass in the mid RV attached to the interventricular septum measures 16 mm x 6 mm.  Mass appears heterogeneous with area of enhancement on LGE imaging.  Mass is concerning for metastatic tumor.  Normal LV size and systolic function.  Normal RV size and systolic function. 5.  Hypertension 6.  History of CVA 7.  Hyperlipidemia 8.  Hypothyroidism 9.  Morbid obesity 10.  Asthma 11.  Migraines 12.  Pain secondary to #1 13.  Port-A-Cath placement 02/25/2021 14.  Hypokalemia 04/01/2021-started a potassium supplement 15.  Hospital admission 06/17/2021-intractable nausea and vomiting EGD 06/19/2021-diffuse gastritis, nonobstructing duodenal mass, biopsy of stomach-no malignancy or H. pylori, duodenal mass-adenocarcinoma 16.  Hospital admission 07/12/2021 with nausea/vomiting, abdominal pain, and hypokalemia 17.  Percutaneous gastrostomy tube placement 07/01/2021 18.  07/21/2022 bilateral lower extremity venous Doppler study-negative for DVT.    Disposition: Ms. Giammona appears unchanged.  She has completed 3 cycles of FOLFIRINOX.  Plan to proceed with cycle 4 as scheduled for 09/02/2023.  Restaging CTs prior to next visit in 2 weeks.  She again had mucositis and has had more weight loss.  Chemotherapy will be dose reduced.  She will return for follow-up in 2 weeks.  We are available to see her sooner if needed.    Lonna Cobb ANP/GNP-BC   03/21/2023  11:56  AM

## 2023-03-21 NOTE — Patient Instructions (Signed)

## 2023-03-22 ENCOUNTER — Other Ambulatory Visit (HOSPITAL_BASED_OUTPATIENT_CLINIC_OR_DEPARTMENT_OTHER): Payer: Self-pay

## 2023-03-22 ENCOUNTER — Other Ambulatory Visit: Payer: Self-pay | Admitting: Nurse Practitioner

## 2023-03-22 ENCOUNTER — Encounter: Payer: Self-pay | Admitting: *Deleted

## 2023-03-22 ENCOUNTER — Inpatient Hospital Stay: Payer: Medicaid Other

## 2023-03-22 ENCOUNTER — Inpatient Hospital Stay: Payer: Medicaid Other | Admitting: Licensed Clinical Social Worker

## 2023-03-22 VITALS — BP 122/76 | HR 63 | Temp 98.1°F | Resp 20

## 2023-03-22 DIAGNOSIS — C250 Malignant neoplasm of head of pancreas: Secondary | ICD-10-CM

## 2023-03-22 DIAGNOSIS — Z5111 Encounter for antineoplastic chemotherapy: Secondary | ICD-10-CM | POA: Diagnosis not present

## 2023-03-22 MED ORDER — PALONOSETRON HCL INJECTION 0.25 MG/5ML
0.2500 mg | Freq: Once | INTRAVENOUS | Status: AC
  Administered 2023-03-22: 0.25 mg via INTRAVENOUS
  Filled 2023-03-22: qty 5

## 2023-03-22 MED ORDER — METHYLPREDNISOLONE SODIUM SUCC 125 MG IJ SOLR
125.0000 mg | Freq: Once | INTRAMUSCULAR | Status: AC
  Administered 2023-03-22: 125 mg via INTRAVENOUS
  Filled 2023-03-22: qty 2

## 2023-03-22 MED ORDER — SODIUM CHLORIDE 0.9 % IV SOLN
200.0000 mg/m2 | Freq: Once | INTRAVENOUS | Status: AC
  Administered 2023-03-22: 376 mg via INTRAVENOUS
  Filled 2023-03-22: qty 18.8

## 2023-03-22 MED ORDER — SODIUM CHLORIDE 0.9 % IV SOLN
150.0000 mg | Freq: Once | INTRAVENOUS | Status: AC
  Administered 2023-03-22: 150 mg via INTRAVENOUS
  Filled 2023-03-22: qty 5

## 2023-03-22 MED ORDER — DIPHENHYDRAMINE HCL 50 MG/ML IJ SOLN
25.0000 mg | Freq: Once | INTRAMUSCULAR | Status: AC
  Administered 2023-03-22: 25 mg via INTRAVENOUS
  Filled 2023-03-22: qty 1

## 2023-03-22 MED ORDER — FAMOTIDINE IN NACL 20-0.9 MG/50ML-% IV SOLN
20.0000 mg | Freq: Once | INTRAVENOUS | Status: AC
  Administered 2023-03-22: 20 mg via INTRAVENOUS
  Filled 2023-03-22: qty 50

## 2023-03-22 MED ORDER — ATROPINE SULFATE 1 MG/ML IV SOLN
0.5000 mg | Freq: Once | INTRAVENOUS | Status: AC | PRN
  Administered 2023-03-22: 0.5 mg via INTRAVENOUS
  Filled 2023-03-22: qty 1

## 2023-03-22 MED ORDER — SODIUM CHLORIDE 0.9 % IV SOLN
1000.0000 mg/m2 | INTRAVENOUS | Status: DC
  Administered 2023-03-22: 2000 mg via INTRAVENOUS
  Filled 2023-03-22: qty 40

## 2023-03-22 MED ORDER — SODIUM CHLORIDE 0.9 % IV SOLN
100.0000 mg/m2 | Freq: Once | INTRAVENOUS | Status: AC
  Administered 2023-03-22: 200 mg via INTRAVENOUS
  Filled 2023-03-22: qty 10

## 2023-03-22 MED ORDER — OXALIPLATIN CHEMO INJECTION 100 MG/20ML
65.0000 mg/m2 | Freq: Once | INTRAVENOUS | Status: AC
  Administered 2023-03-22: 120 mg via INTRAVENOUS
  Filled 2023-03-22: qty 20

## 2023-03-22 MED ORDER — DEXTROSE 5 % IV SOLN: Freq: Once | INTRAVENOUS | Status: AC

## 2023-03-22 MED ORDER — OXYCODONE HCL 10 MG PO TABS
10.0000 mg | ORAL_TABLET | ORAL | 0 refills | Status: DC | PRN
Start: 2023-03-22 — End: 2023-04-04
  Filled 2023-03-22: qty 60, 5d supply, fill #0

## 2023-03-22 NOTE — Patient Instructions (Addendum)
[place] [facility]   The chemotherapy medication bag should finish at 46 hours, 96 hours, or 7 days. For example, if your pump is scheduled for 46 hours and it was put on at 4:00 p.m., it should finish at 2:00 p.m. the day it is scheduled to come off regardless of your appointment time.     Estimated time to finish at 3:30 Thursday, March 24, 2023.   If the display on your pump reads "Low Volume" and it is beeping, take the batteries out of the pump and come to the cancer center for it to be taken off.   If the pump alarms go off prior to the pump reading "Low Volume" then call [phone] and someone can assist you.  If the plunger comes out and the chemotherapy medication is leaking out, please use your home chemo spill kit to clean up the spill. Do NOT use paper towels or other household products.  If you have problems or questions regarding your pump, please call either [phone] (24 hours a day) or the cancer center Monday-Friday 8:00 a.m.- 4:30 p.m. at the clinic number and we will assist you. If you are unable to get assistance, then go to the nearest Emergency Department and ask the staff to contact the IV team for assistance.  Discharge Instructions: Thank you for choosing [facility] to provide your oncology and hematology care.   If you have a lab appointment with the Cancer Center, please go directly to the Cancer Center and check in at the registration area.   Wear comfortable clothing and clothing appropriate for easy access to any Portacath or PICC line.   We strive to give you quality time with your provider. You may need to reschedule your appointment if you arrive late (15 or more minutes).  Arriving late affects you and other patients whose appointments are after yours.  Also, if you miss three or more appointments without notifying the office, you may be dismissed from the clinic at the provider's discretion.      For  prescription refill requests, have your pharmacy contact our office and allow 72 hours for refills to be completed.    Today you received the following chemotherapy and/or immunotherapy agents Oxaliplatin, Leucovorin, Irinotecan, Fluorouracil.      To help prevent nausea and vomiting after your treatment, we encourage you to take your nausea medication as directed.  BELOW ARE SYMPTOMS THAT SHOULD BE REPORTED IMMEDIATELY: *FEVER GREATER THAN 100.4 F (38 C) OR HIGHER *CHILLS OR SWEATING *NAUSEA AND VOMITING THAT IS NOT CONTROLLED WITH YOUR NAUSEA MEDICATION *UNUSUAL SHORTNESS OF BREATH *UNUSUAL BRUISING OR BLEEDING *URINARY PROBLEMS (pain or burning when urinating, or frequent urination) *BOWEL PROBLEMS (unusual diarrhea, constipation, pain near the anus) TENDERNESS IN MOUTH AND THROAT WITH OR WITHOUT PRESENCE OF ULCERS (sore throat, sores in mouth, or a toothache) UNUSUAL RASH, SWELLING OR PAIN  UNUSUAL VAGINAL DISCHARGE OR ITCHING   Items with * indicate a potential emergency and should be followed up as soon as possible or go to the Emergency Department if any problems should occur.  Please show the CHEMOTHERAPY ALERT CARD or IMMUNOTHERAPY ALERT CARD at check-in to the Emergency Department and triage nurse.  Should you have questions after your visit or need to cancel or reschedule your appointment, please contact [facility]  Dept: [phone]  and follow the prompts.  Office hours are 8:00 a.m. to 4:30 p.m. Monday - Friday. Please  note that voicemails left after 4:00 p.m. may not be returned until the following business day.  We are closed weekends and major holidays. You have access to a nurse at all times for urgent questions. Please call the main number to the clinic Dept: [phone] and follow the prompts.   For any non-urgent questions, you may also contact your provider using MyChart. We now offer e-Visits for anyone 50 and older to request  care online for non-urgent symptoms. For details visit [email or web address].   Also download the MyChart app! Go to the app store, search "MyChart", open the app, select [facility], and log in with your MyChart username and password.  Oxaliplatin Injection What is this medication? OXALIPLATIN (ox AL i PLA tin) treats colorectal cancer. It works by slowing down the growth of cancer cells. This medicine may be used for other purposes; ask your health care provider or pharmacist if you have questions. COMMON BRAND NAME(S): Eloxatin What should I tell my care team before I take this medication? They need to know if you have any of these conditions: Heart disease History of irregular heartbeat or rhythm Liver disease Low blood cell levels (white cells, red cells, and platelets) Lung or breathing disease, such as asthma Take medications that treat or prevent blood clots Tingling of the fingers, toes, or other nerve disorder An unusual or allergic reaction to oxaliplatin, other medications, foods, dyes, or preservatives If you or your partner are pregnant or trying to get pregnant Breast-feeding How should I use this medication? This medication is injected into a vein. It is given by your care team in a hospital or clinic setting. Talk to your care team about the use of this medication in children. Special care may be needed. Overdosage: If you think you have taken too much of this medicine contact a poison control center or emergency room at once. NOTE: This medicine is only for you. Do not share this medicine with others. What if I miss a dose? Keep appointments for follow-up doses. It is important not to miss a dose. Call your care team if you are unable to keep an appointment. What may interact with this medication? Do not take this medication with any of the following: Cisapride Dronedarone Pimozide Thioridazine This medication may also interact with the following: Aspirin and  aspirin-like medications Certain medications that treat or prevent blood clots, such as warfarin, apixaban, dabigatran, and rivaroxaban Cisplatin Cyclosporine Diuretics Medications for infection, such as acyclovir, adefovir, amphotericin B, bacitracin, cidofovir, foscarnet, ganciclovir, gentamicin, pentamidine, vancomycin NSAIDs, medications for pain and inflammation, such as ibuprofen or naproxen Other medications that cause heart rhythm changes Pamidronate Zoledronic acid This list may not describe all possible interactions. Give your health care provider a list of all the medicines, herbs, non-prescription drugs, or dietary supplements you use. Also tell them if you smoke, drink alcohol, or use illegal drugs. Some items may interact with your medicine. What should I watch for while using this medication? Your condition will be monitored carefully while you are receiving this medication. You may need blood work while taking this medication. This medication may make you feel generally unwell. This is not uncommon as chemotherapy can affect healthy cells as well as cancer cells. Report any side effects. Continue your course of treatment even though you feel ill unless your care team tells you to stop. This medication may increase your risk of getting an infection. Call your care team for advice if you get a fever, chills, sore throat,  or other symptoms of a cold or flu. Do not treat yourself. Try to avoid being around people who are sick. Avoid taking medications that contain aspirin, acetaminophen, ibuprofen, naproxen, or ketoprofen unless instructed by your care team. These medications may hide a fever. Be careful brushing or flossing your teeth or using a toothpick because you may get an infection or bleed more easily. If you have any dental work done, tell your dentist you are receiving this medication. This medication can make you more sensitive to cold. Do not drink cold drinks or use ice.  Cover exposed skin before coming in contact with cold temperatures or cold objects. When out in cold weather wear warm clothing and cover your mouth and nose to warm the air that goes into your lungs. Tell your care team if you get sensitive to the cold. Talk to your care team if you or your partner are pregnant or think either of you might be pregnant. This medication can cause serious birth defects if taken during pregnancy and for 9 months after the last dose. A negative pregnancy test is required before starting this medication. A reliable form of contraception is recommended while taking this medication and for 9 months after the last dose. Talk to your care team about effective forms of contraception. Do not father a child while taking this medication and for 6 months after the last dose. Use a condom while having sex during this time period. Do not breastfeed while taking this medication and for 3 months after the last dose. This medication may cause infertility. Talk to your care team if you are concerned about your fertility. What side effects may I notice from receiving this medication? Side effects that you should report to your care team as soon as possible: Allergic reactions--skin rash, itching, hives, swelling of the face, lips, tongue, or throat Bleeding--bloody or black, tar-like stools, vomiting blood or brown material that looks like coffee grounds, red or dark brown urine, small red or purple spots on skin, unusual bruising or bleeding Dry cough, shortness of breath or trouble breathing Heart rhythm changes--fast or irregular heartbeat, dizziness, feeling faint or lightheaded, chest pain, trouble breathing Infection--fever, chills, cough, sore throat, wounds that don't heal, pain or trouble when passing urine, general feeling of discomfort or being unwell Liver injury--right upper belly pain, loss of appetite, nausea, light-colored stool, dark yellow or brown urine, yellowing skin or  eyes, unusual weakness or fatigue Low red blood cell level--unusual weakness or fatigue, dizziness, headache, trouble breathing Muscle injury--unusual weakness or fatigue, muscle pain, dark yellow or brown urine, decrease in amount of urine Pain, tingling, or numbness in the hands or feet Sudden and severe headache, confusion, change in vision, seizures, which may be signs of posterior reversible encephalopathy syndrome (PRES) Unusual bruising or bleeding Side effects that usually do not require medical attention (report to your care team if they continue or are bothersome): Diarrhea Nausea Pain, redness, or swelling with sores inside the mouth or throat Unusual weakness or fatigue Vomiting This list may not describe all possible side effects. Call your doctor for medical advice about side effects. You may report side effects to FDA at 1-800-FDA-1088. Where should I keep my medication? This medication is given in a hospital or clinic. It will not be stored at home. NOTE: This sheet is a summary. It may not cover all possible information. If you have questions about this medicine, talk to your doctor, pharmacist, or health care provider.  2023 Elsevier/Gold  Standard (2008-01-20 00:00:00) Leucovorin Injection What is this medication? LEUCOVORIN (loo koe VOR in) prevents side effects from certain medications, such as methotrexate. It works by increasing folate levels. This helps protect healthy cells in your body. It may also be used to treat anemia caused by low levels of folate. It can also be used with fluorouracil, a type of chemotherapy, to treat colorectal cancer. It works by increasing the effects of fluorouracil in the body. This medicine may be used for other purposes; ask your health care provider or pharmacist if you have questions. What should I tell my care team before I take this medication? They need to know if you have any of these conditions: Anemia from low levels of vitamin  B12 in the blood An unusual or allergic reaction to leucovorin, folic acid, other medications, foods, dyes, or preservatives Pregnant or trying to get pregnant Breastfeeding How should I use this medication? This medication is injected into a vein or a muscle. It is given by your care team in a hospital or clinic setting. Talk to your care team about the use of this medication in children. Special care may be needed. Overdosage: If you think you have taken too much of this medicine contact a poison control center or emergency room at once. NOTE: This medicine is only for you. Do not share this medicine with others. What if I miss a dose? Keep appointments for follow-up doses. It is important not to miss your dose. Call your care team if you are unable to keep an appointment. What may interact with this medication? Capecitabine Fluorouracil Phenobarbital Phenytoin Primidone Trimethoprim;sulfamethoxazole This list may not describe all possible interactions. Give your health care provider a list of all the medicines, herbs, non-prescription drugs, or dietary supplements you use. Also tell them if you smoke, drink alcohol, or use illegal drugs. Some items may interact with your medicine. What should I watch for while using this medication? Your condition will be monitored carefully while you are receiving this medication. This medication may increase the side effects of 5-fluorouracil. Tell your care team if you have diarrhea or mouth sores that do not get better or that get worse. What side effects may I notice from receiving this medication? Side effects that you should report to your care team as soon as possible: Allergic reactions--skin rash, itching, hives, swelling of the face, lips, tongue, or throat This list may not describe all possible side effects. Call your doctor for medical advice about side effects. You may report side effects to FDA at 1-800-FDA-1088. Where should I keep my  medication? This medication is given in a hospital or clinic. It will not be stored at home. NOTE: This sheet is a summary. It may not cover all possible information. If you have questions about this medicine, talk to your doctor, pharmacist, or health care provider.  2023 Elsevier/Gold Standard (2022-04-09 00:00:00) Irinotecan Injection What is this medication? IRINOTECAN (ir in oh TEE kan) treats some types of cancer. It works by slowing down the growth of cancer cells. This medicine may be used for other purposes; ask your health care provider or pharmacist if you have questions. COMMON BRAND NAME(S): Camptosar What should I tell my care team before I take this medication? They need to know if you have any of these conditions: Dehydration Diarrhea Infection, especially a viral infection, such as chickenpox, cold sores, herpes Liver disease Low blood cell levels (white cells, red cells, and platelets) Low levels of electrolytes, such as  calcium, magnesium, or potassium in your blood Recent or ongoing radiation An unusual or allergic reaction to irinotecan, other medications, foods, dyes, or preservatives If you or your partner are pregnant or trying to get pregnant Breast-feeding How should I use this medication? This medication is injected into a vein. It is given by your care team in a hospital or clinic setting. Talk to your care team about the use of this medication in children. Special care may be needed. Overdosage: If you think you have taken too much of this medicine contact a poison control center or emergency room at once. NOTE: This medicine is only for you. Do not share this medicine with others. What if I miss a dose? Keep appointments for follow-up doses. It is important not to miss your dose. Call your care team if you are unable to keep an appointment. What may interact with this medication? Do not take this medication with any of the  following: Cobicistat Itraconazole This medication may also interact with the following: Certain antibiotics, such as clarithromycin, rifampin, rifabutin Certain antivirals for HIV or AIDS Certain medications for fungal infections, such as ketoconazole, posaconazole, voriconazole Certain medications for seizures, such as carbamazepine, phenobarbital, phenytoin Gemfibrozil Nefazodone St. John's wort This list may not describe all possible interactions. Give your health care provider a list of all the medicines, herbs, non-prescription drugs, or dietary supplements you use. Also tell them if you smoke, drink alcohol, or use illegal drugs. Some items may interact with your medicine. What should I watch for while using this medication? Your condition will be monitored carefully while you are receiving this medication. You may need blood work while taking this medication. This medication may make you feel generally unwell. This is not uncommon as chemotherapy can affect healthy cells as well as cancer cells. Report any side effects. Continue your course of treatment even though you feel ill unless your care team tells you to stop. This medication can cause serious side effects. To reduce the risk, your care team may give you other medications to take before receiving this one. Be sure to follow the directions from your care team. This medication may affect your coordination, reaction time, or judgement. Do not drive or operate machinery until you know how this medication affects you. Sit up or stand slowly to reduce the risk of dizzy or fainting spells. Drinking alcohol with this medication can increase the risk of these side effects. This medication may increase your risk of getting an infection. Call your care team for advice if you get a fever, chills, sore throat, or other symptoms of a cold or flu. Do not treat yourself. Try to avoid being around people who are sick. Avoid taking medications that  contain aspirin, acetaminophen, ibuprofen, naproxen, or ketoprofen unless instructed by your care team. These medications may hide a fever. This medication may increase your risk to bruise or bleed. Call your care team if you notice any unusual bleeding. Be careful brushing or flossing your teeth or using a toothpick because you may get an infection or bleed more easily. If you have any dental work done, tell your dentist you are receiving this medication. Talk to your care team if you or your partner are pregnant or think either of you might be pregnant. This medication can cause serious birth defects if taken during pregnancy and for 6 months after the last dose. You will need a negative pregnancy test before starting this medication. Contraception is recommended while taking this  medication and for 6 months after the last dose. Your care team can help you find the option that works for you. Do not father a child while taking this medication and for 3 months after the last dose. Use a condom for contraception during this time period. Do not breastfeed while taking this medication and for 7 days after the last dose. This medication may cause infertility. Talk to your care team if you are concerned about your fertility. What side effects may I notice from receiving this medication? Side effects that you should report to your care team as soon as possible: Allergic reactions--skin rash, itching, hives, swelling of the face, lips, tongue, or throat Dry cough, shortness of breath or trouble breathing Increased saliva or tears, increased sweating, stomach cramping, diarrhea, small pupils, unusual weakness or fatigue, slow heartbeat Infection--fever, chills, cough, sore throat, wounds that don't heal, pain or trouble when passing urine, general feeling of discomfort or being unwell Kidney injury--decrease in the amount of urine, swelling of the ankles, hands, or feet Low red blood cell level--unusual  weakness or fatigue, dizziness, headache, trouble breathing Severe or prolonged diarrhea Unusual bruising or bleeding Side effects that usually do not require medical attention (report to your care team if they continue or are bothersome): Constipation Diarrhea Hair loss Loss of appetite Nausea Stomach pain This list may not describe all possible side effects. Call your doctor for medical advice about side effects. You may report side effects to FDA at 1-800-FDA-1088. Where should I keep my medication? This medication is given in a hospital or clinic. It will not be stored at home. NOTE: This sheet is a summary. It may not cover all possible information. If you have questions about this medicine, talk to your doctor, pharmacist, or health care provider.  2023 Elsevier/Gold Standard (2022-04-08 00:00:00) Fluorouracil Injection What is this medication? FLUOROURACIL (flure oh YOOR a sil) treats some types of cancer. It works by slowing down the growth of cancer cells. This medicine may be used for other purposes; ask your health care provider or pharmacist if you have questions. COMMON BRAND NAME(S): Adrucil What should I tell my care team before I take this medication? They need to know if you have any of these conditions: Blood disorders Dihydropyrimidine dehydrogenase (DPD) deficiency Infection, such as chickenpox, cold sores, herpes Kidney disease Liver disease Poor nutrition Recent or ongoing radiation therapy An unusual or allergic reaction to fluorouracil, other medications, foods, dyes, or preservatives If you or your partner are pregnant or trying to get pregnant Breast-feeding How should I use this medication? This medication is injected into a vein. It is administered by your care team in a hospital or clinic setting. Talk to your care team about the use of this medication in children. Special care may be needed. Overdosage: If you think you have taken too much of this  medicine contact a poison control center or emergency room at once. NOTE: This medicine is only for you. Do not share this medicine with others. What if I miss a dose? Keep appointments for follow-up doses. It is important not to miss your dose. Call your care team if you are unable to keep an appointment. What may interact with this medication? Do not take this medication with any of the following: Live virus vaccines This medication may also interact with the following: Medications that treat or prevent blood clots, such as warfarin, enoxaparin, dalteparin This list may not describe all possible interactions. Give your health care provider  a list of all the medicines, herbs, non-prescription drugs, or dietary supplements you use. Also tell them if you smoke, drink alcohol, or use illegal drugs. Some items may interact with your medicine. What should I watch for while using this medication? Your condition will be monitored carefully while you are receiving this medication. This medication may make you feel generally unwell. This is not uncommon as chemotherapy can affect healthy cells as well as cancer cells. Report any side effects. Continue your course of treatment even though you feel ill unless your care team tells you to stop. In some cases, you may be given additional medications to help with side effects. Follow all directions for their use. This medication may increase your risk of getting an infection. Call your care team for advice if you get a fever, chills, sore throat, or other symptoms of a cold or flu. Do not treat yourself. Try to avoid being around people who are sick. This medication may increase your risk to bruise or bleed. Call your care team if you notice any unusual bleeding. Be careful brushing or flossing your teeth or using a toothpick because you may get an infection or bleed more easily. If you have any dental work done, tell your dentist you are receiving this  medication. Avoid taking medications that contain aspirin, acetaminophen, ibuprofen, naproxen, or ketoprofen unless instructed by your care team. These medications may hide a fever. Do not treat diarrhea with over the counter products. Contact your care team if you have diarrhea that lasts more than 2 days or if it is severe and watery. This medication can make you more sensitive to the sun. Keep out of the sun. If you cannot avoid being in the sun, wear protective clothing and sunscreen. Do not use sun lamps, tanning beds, or tanning booths. Talk to your care team if you or your partner wish to become pregnant or think you might be pregnant. This medication can cause serious birth defects if taken during pregnancy and for 3 months after the last dose. A reliable form of contraception is recommended while taking this medication and for 3 months after the last dose. Talk to your care team about effective forms of contraception. Do not father a child while taking this medication and for 3 months after the last dose. Use a condom while having sex during this time period. Do not breastfeed while taking this medication. This medication may cause infertility. Talk to your care team if you are concerned about your fertility. What side effects may I notice from receiving this medication? Side effects that you should report to your care team as soon as possible: Allergic reactions--skin rash, itching, hives, swelling of the face, lips, tongue, or throat Heart attack--pain or tightness in the chest, shoulders, arms, or jaw, nausea, shortness of breath, cold or clammy skin, feeling faint or lightheaded Heart failure--shortness of breath, swelling of the ankles, feet, or hands, sudden weight gain, unusual weakness or fatigue Heart rhythm changes--fast or irregular heartbeat, dizziness, feeling faint or lightheaded, chest pain, trouble breathing High ammonia level--unusual weakness or fatigue, confusion, loss of  appetite, nausea, vomiting, seizures Infection--fever, chills, cough, sore throat, wounds that don't heal, pain or trouble when passing urine, general feeling of discomfort or being unwell Low red blood cell level--unusual weakness or fatigue, dizziness, headache, trouble breathing Pain, tingling, or numbness in the hands or feet, muscle weakness, change in vision, confusion or trouble speaking, loss of balance or coordination, trouble walking, seizures Redness, swelling,  and blistering of the skin over hands and feet Severe or prolonged diarrhea Unusual bruising or bleeding Side effects that usually do not require medical attention (report to your care team if they continue or are bothersome): Dry skin Headache Increased tears Nausea Pain, redness, or swelling with sores inside the mouth or throat Sensitivity to light Vomiting This list may not describe all possible side effects. Call your doctor for medical advice about side effects. You may report side effects to FDA at 1-800-FDA-1088. Where should I keep my medication? This medication is given in a hospital or clinic. It will not be stored at home. NOTE: This sheet is a summary. It may not cover all possible information. If you have questions about this medicine, talk to your doctor, pharmacist, or health care provider.  2023 Elsevier/Gold Standard (2022-03-30 00:00:00)

## 2023-03-22 NOTE — Progress Notes (Signed)
[facility] CSW Progress Note  Visual merchandiser met with patient to assess needs.  Patient expressed needing financial assistance.  Provided her with her second [employer] and a list of grants for pancreatic cancer.  Also gave her the information for two free housecleaning's.  She requested a case of ensure, which [place] logged and provided.  Gave her Netta Corrigan contact information and encouraged her to contact her since CSW had already made the referral.  Patient appeared very sleepy during the visit.  No other needs were expressed.      Pascal Lux Jackalynn Art, LCSW    Patient is participating in a Managed Medicaid Plan:  Yes

## 2023-03-22 NOTE — Progress Notes (Signed)
Provided Wendy Hoffman a case of vanilla Ensure

## 2023-03-23 LAB — CANCER ANTIGEN 19-9: CA 19-9: 16353 U/mL — ABNORMAL HIGH (ref 0–35)

## 2023-03-24 ENCOUNTER — Inpatient Hospital Stay: Payer: Medicaid Other

## 2023-03-24 ENCOUNTER — Telehealth: Payer: Self-pay

## 2023-03-24 VITALS — BP 111/63 | HR 63 | Temp 98.2°F | Resp 20

## 2023-03-24 DIAGNOSIS — Z5111 Encounter for antineoplastic chemotherapy: Secondary | ICD-10-CM | POA: Diagnosis not present

## 2023-03-24 DIAGNOSIS — C250 Malignant neoplasm of head of pancreas: Secondary | ICD-10-CM

## 2023-03-24 MED ORDER — SODIUM CHLORIDE 0.9% FLUSH
10.0000 mL | INTRAVENOUS | Status: DC | PRN
  Administered 2023-03-24: 10 mL

## 2023-03-24 MED ORDER — HEPARIN SOD (PORK) LOCK FLUSH 100 UNIT/ML IV SOLN
500.0000 [IU] | Freq: Once | INTRAVENOUS | Status: AC | PRN
  Administered 2023-03-24: 500 [IU]

## 2023-03-24 MED ORDER — PEGFILGRASTIM-CBQV 6 MG/0.6ML ~~LOC~~ SOSY
6.0000 mg | PREFILLED_SYRINGE | Freq: Once | SUBCUTANEOUS | Status: AC
  Administered 2023-03-24: 6 mg via SUBCUTANEOUS
  Filled 2023-03-24: qty 0.6

## 2023-03-24 NOTE — Telephone Encounter (Signed)
-----   Message from Rana Snare, NP sent at 03/23/2023 11:02 AM EDT ----- Please let her know the right femur x-ray showed some degenerative changes but nothing acute.  Follow-up as scheduled.

## 2023-03-24 NOTE — Patient Instructions (Signed)
Implanted Port Home Guide An implanted port is a device that is placed under the skin. It is usually placed in the chest. The device may vary based on the need. Implanted ports can be used to give IV medicine, to take blood, or to give fluids. You may have an implanted port if: You need IV medicine that would be irritating to the small veins in your hands or arms. You need IV medicines, such as chemotherapy, for a long period of time. You need IV nutrition for a long period of time. You may have fewer limitations when using a port than you would if you used other types of long-term IVs. You will also likely be able to return to normal activities after your incision heals. An implanted port has two main parts: Reservoir. The reservoir is the part where a needle is inserted to give medicines or draw blood. The reservoir is round. After the port is placed, it appears as a small, raised area under your skin. Catheter. The catheter is a small, thin tube that connects the reservoir to a vein. Medicine that is inserted into the reservoir goes into the catheter and then into the vein. How is my port accessed? To access your port: A numbing cream may be placed on the skin over the port site. Your health care provider will put on a mask and sterile gloves. The skin over your port will be cleaned carefully with a germ-killing soap and allowed to dry. Your health care provider will gently pinch the port and insert a needle into it. Your health care provider will check for a blood return to make sure the port is in the vein and is still working (patent). If your port needs to remain accessed to get medicine continuously (constant infusion), your health care provider will place a clear bandage (dressing) over the needle site. The dressing and needle will need to be changed every week, or as told by your health care provider. What is flushing? Flushing helps keep the port working. Follow instructions from your  health care provider about how and when to flush the port. Ports are usually flushed with saline solution or a medicine called heparin. The need for flushing will depend on how the port is used: If the port is only used from time to time to give medicines or draw blood, the port may need to be flushed: Before and after medicines have been given. Before and after blood has been drawn. As part of routine maintenance. Flushing may be recommended every 4-6 weeks. If a constant infusion is running, the port may not need to be flushed. Throw away any syringes in a disposal container that is meant for sharp items (sharps container). You can buy a sharps container from a pharmacy, or you can make one by using an empty hard plastic bottle with a cover. How long will my port stay implanted? The port can stay in for as long as your health care provider thinks it is needed. When it is time for the port to come out, a surgery will be done to remove it. The surgery will be similar to the procedure that was done to put the port in. Follow these instructions at home: Caring for your port and port site Flush your port as told by your health care provider. If you need an infusion over several days, follow instructions from your health care provider about how to take care of your port site. Make sure you: Change your   dressing as told by your health care provider. Wash your hands with soap and water for at least 20 seconds before and after you change your dressing. If soap and water are not available, use alcohol-based hand sanitizer. Place any used dressings or infusion bags into a plastic bag. Throw that bag in the trash. Keep the dressing that covers the needle clean and dry. Do not get it wet. Do not use scissors or sharp objects near the infusion tubing. Keep any external tubes clamped, unless they are being used. Check your port site every day for signs of infection. Check for: Redness, swelling, or  pain. Fluid or blood. Warmth. Pus or a bad smell. Protect the skin around the port site. Avoid wearing bra straps that rub or irritate the site. Protect the skin around your port from seat belts. Place a soft pad over your chest if needed. Bathe or shower as told by your health care provider. The site may get wet as long as you are not actively receiving an infusion. General instructions  Return to your normal activities as told by your health care provider. Ask your health care provider what activities are safe for you. Carry a medical alert card or wear a medical alert bracelet at all times. This will let health care providers know that you have an implanted port in case of an emergency. Where to find more information American Cancer Society: www.cancer.org American Society of Clinical Oncology: www.cancer.net Contact a health care provider if: You have a fever or chills. You have redness, swelling, or pain at the port site. You have fluid or blood coming from your port site. Your incision feels warm to the touch. You have pus or a bad smell coming from the port site. Summary Implanted ports are usually placed in the chest for long-term IV access. Follow instructions from your health care provider about flushing the port and changing bandages (dressings). Take care of the area around your port by avoiding clothing that puts pressure on the area, and by watching for signs of infection. Protect the skin around your port from seat belts. Place a soft pad over your chest if needed. Contact a health care provider if you have a fever or you have redness, swelling, pain, fluid, or a bad smell at the port site. This information is not intended to replace advice given to you by your health care provider. Make sure you discuss any questions you have with your health care provider. Document Revised: 06/02/2021 Document Reviewed: 06/02/2021 Elsevier Patient Education  2023 Elsevier  Inc.  Pegfilgrastim Injection What is this medication? PEGFILGRASTIM (PEG fil gra stim) lowers the risk of infection in people who are receiving chemotherapy. It works by helping your body make more white blood cells, which protects your body from infection. It may also be used to help people who have been exposed to high doses of radiation. This medicine may be used for other purposes; ask your health care provider or pharmacist if you have questions. COMMON BRAND NAME(S): Fulphila, Fylnetra, Neulasta, Nyvepria, Stimufend, UDENYCA, Ziextenzo What should I tell my care team before I take this medication? They need to know if you have any of these conditions: Kidney disease Latex allergy Ongoing radiation therapy Sickle cell disease Skin reactions to acrylic adhesives (On-Body Injector only) An unusual or allergic reaction to pegfilgrastim, filgrastim, other medications, foods, dyes, or preservatives Pregnant or trying to get pregnant Breast-feeding How should I use this medication? This medication is for injection under the   skin. If you get this medication at home, you will be taught how to prepare and give the pre-filled syringe or how to use the On-body Injector. Refer to the patient Instructions for Use for detailed instructions. Use exactly as directed. Tell your care team immediately if you suspect that the On-body Injector may not have performed as intended or if you suspect the use of the On-body Injector resulted in a missed or partial dose. It is important that you put your used needles and syringes in a special sharps container. Do not put them in a trash can. If you do not have a sharps container, call your pharmacist or care team to get one. Talk to your care team about the use of this medication in children. While this medication may be prescribed for selected conditions, precautions do apply. Overdosage: If you think you have taken too much of this medicine contact a poison control  center or emergency room at once. NOTE: This medicine is only for you. Do not share this medicine with others. What if I miss a dose? It is important not to miss your dose. Call your care team if you miss your dose. If you miss a dose due to an On-body Injector failure or leakage, a new dose should be administered as soon as possible using a single prefilled syringe for manual use. What may interact with this medication? Interactions have not been studied. This list may not describe all possible interactions. Give your health care provider a list of all the medicines, herbs, non-prescription drugs, or dietary supplements you use. Also tell them if you smoke, drink alcohol, or use illegal drugs. Some items may interact with your medicine. What should I watch for while using this medication? Your condition will be monitored carefully while you are receiving this medication. You may need blood work done while you are taking this medication. Talk to your care team about your risk of cancer. You may be more at risk for certain types of cancer if you take this medication. If you are going to need a MRI, CT scan, or other procedure, tell your care team that you are using this medication (On-Body Injector only). What side effects may I notice from receiving this medication? Side effects that you should report to your care team as soon as possible: Allergic reactions--skin rash, itching, hives, swelling of the face, lips, tongue, or throat Capillary leak syndrome--stomach or muscle pain, unusual weakness or fatigue, feeling faint or lightheaded, decrease in the amount of urine, swelling of the ankles, hands, or feet, trouble breathing High white blood cell level--fever, fatigue, trouble breathing, night sweats, change in vision, weight loss Inflammation of the aorta--fever, fatigue, back, chest, or stomach pain, severe headache Kidney injury (glomerulonephritis)--decrease in the amount of urine, red or dark  brown urine, foamy or bubbly urine, swelling of the ankles, hands, or feet Shortness of breath or trouble breathing Spleen injury--pain in upper left stomach or shoulder Unusual bruising or bleeding Side effects that usually do not require medical attention (report to your care team if they continue or are bothersome): Bone pain Pain in the hands or feet This list may not describe all possible side effects. Call your doctor for medical advice about side effects. You may report side effects to FDA at 1-800-FDA-1088. Where should I keep my medication? Keep out of the reach of children. If you are using this medication at home, you will be instructed on how to store it. Throw away any unused   medication after the expiration date on the label. NOTE: This sheet is a summary. It may not cover all possible information. If you have questions about this medicine, talk to your doctor, pharmacist, or health care provider.  2023 Elsevier/Gold Standard (2021-08-14 00:00:00)  

## 2023-03-24 NOTE — Telephone Encounter (Signed)
Patient gave verbal understanding and had no further questions or concerns  

## 2023-03-25 ENCOUNTER — Telehealth: Payer: Self-pay | Admitting: *Deleted

## 2023-03-25 NOTE — Telephone Encounter (Signed)
Patient left VM that she is not feeling well today and can't stay awake. Requested return call. This RN returned call and had to leave a voice mail.

## 2023-03-25 NOTE — Telephone Encounter (Signed)
Ms. Wendy Hoffman reports that she has felt dizzy and feels as if she can't stay awake since last night. Also having occasional N/V despite zofran, compazine and scopolamine patch. She states that she has had vertigo in the past. Denies any fever and she reports oral fluid has been "good". Denies any headache or visual symptoms and speech is clear. She has no way to check her BP at home.Has not been around anyone with similar symptoms.  Last FOLFIRINOX 03/22/23

## 2023-03-25 NOTE — Telephone Encounter (Signed)
Called Bliss back after speaking w/NP: Need to go to ER. Could be early stage of an infection or could be having stroke/brain mets. She agrees to go to ER.

## 2023-03-28 ENCOUNTER — Encounter: Payer: Self-pay | Admitting: *Deleted

## 2023-03-31 ENCOUNTER — Ambulatory Visit (HOSPITAL_BASED_OUTPATIENT_CLINIC_OR_DEPARTMENT_OTHER)
Admission: RE | Admit: 2023-03-31 | Discharge: 2023-03-31 | Disposition: A | Discharge status: Home / Self Care | Payer: Medicaid Other | Source: Ambulatory Visit | Attending: Nurse Practitioner | Admitting: Nurse Practitioner

## 2023-03-31 ENCOUNTER — Telehealth: Payer: Self-pay | Admitting: Emergency Medicine

## 2023-03-31 ENCOUNTER — Inpatient Hospital Stay: Payer: Medicaid Other

## 2023-03-31 DIAGNOSIS — C250 Malignant neoplasm of head of pancreas: Secondary | ICD-10-CM

## 2023-03-31 MED ORDER — HEPARIN SOD (PORK) LOCK FLUSH 100 UNIT/ML IV SOLN
500.0000 [IU] | Freq: Once | INTRAVENOUS | Status: AC
  Administered 2023-03-31: 500 [IU] via INTRAVENOUS

## 2023-03-31 MED ORDER — IOHEXOL 300 MG/ML  SOLN
100.0000 mL | Freq: Once | INTRAMUSCULAR | Status: AC | PRN
  Administered 2023-03-31: 85 mL via INTRAVENOUS

## 2023-03-31 NOTE — Telephone Encounter (Signed)
Telephone call to patient to confirm with patient that she had made her transportation arrangements and would be able to make her 3:00 appointment for CT scan port access. There was no answer, left voicemail message.

## 2023-04-03 ENCOUNTER — Other Ambulatory Visit: Payer: Self-pay | Admitting: Oncology

## 2023-04-04 ENCOUNTER — Other Ambulatory Visit (HOSPITAL_BASED_OUTPATIENT_CLINIC_OR_DEPARTMENT_OTHER): Payer: Self-pay

## 2023-04-04 ENCOUNTER — Encounter: Payer: Self-pay | Admitting: Nurse Practitioner

## 2023-04-04 ENCOUNTER — Inpatient Hospital Stay (HOSPITAL_BASED_OUTPATIENT_CLINIC_OR_DEPARTMENT_OTHER): Payer: Medicaid Other | Admitting: Nurse Practitioner

## 2023-04-04 ENCOUNTER — Inpatient Hospital Stay: Payer: Medicaid Other

## 2023-04-04 VITALS — BP 124/78 | HR 89 | Temp 98.1°F | Resp 20 | Ht 64.0 in | Wt 175.0 lb

## 2023-04-04 DIAGNOSIS — C259 Malignant neoplasm of pancreas, unspecified: Secondary | ICD-10-CM | POA: Diagnosis not present

## 2023-04-04 DIAGNOSIS — C250 Malignant neoplasm of head of pancreas: Secondary | ICD-10-CM | POA: Diagnosis not present

## 2023-04-04 DIAGNOSIS — Z5111 Encounter for antineoplastic chemotherapy: Secondary | ICD-10-CM | POA: Diagnosis not present

## 2023-04-04 DIAGNOSIS — Z95828 Presence of other vascular implants and grafts: Secondary | ICD-10-CM

## 2023-04-04 LAB — CMP (CANCER CENTER ONLY)
ALT: 15 U/L (ref 0–44)
AST: 19 U/L (ref 15–41)
Albumin: 3.4 g/dL — ABNORMAL LOW (ref 3.5–5.0)
Alkaline Phosphatase: 129 U/L — ABNORMAL HIGH (ref 38–126)
Anion gap: 8 (ref 5–15)
BUN: 24 mg/dL — ABNORMAL HIGH (ref 6–20)
CO2: 30 mmol/L (ref 22–32)
Calcium: 8.9 mg/dL (ref 8.9–10.3)
Chloride: 102 mmol/L (ref 98–111)
Creatinine: 0.87 mg/dL (ref 0.44–1.00)
GFR, Estimated: >60 mL/min (ref >60)
Glucose, Bld: 135 mg/dL — ABNORMAL HIGH (ref 70–99)
Potassium: 3.6 mmol/L (ref 3.5–5.1)
Sodium: 140 mmol/L (ref 135–145)
Total Bilirubin: 0.3 mg/dL (ref 0.3–1.2)
Total Protein: 5.8 g/dL — ABNORMAL LOW (ref 6.5–8.1)

## 2023-04-04 LAB — CBC WITH DIFFERENTIAL (CANCER CENTER ONLY)
Abs Immature Granulocytes: 0.64 10*3/uL — ABNORMAL HIGH (ref 0.00–0.07)
Basophils Absolute: 0.1 10*3/uL (ref 0.0–0.1)
Basophils Relative: 1 %
Eosinophils Absolute: 0.2 10*3/uL (ref 0.0–0.5)
Eosinophils Relative: 2 %
HCT: 30.5 % — ABNORMAL LOW (ref 36.0–46.0)
Hemoglobin: 9.9 g/dL — ABNORMAL LOW (ref 12.0–15.0)
Immature Granulocytes: 5 %
Lymphocytes Relative: 13 %
Lymphs Abs: 1.8 10*3/uL (ref 0.7–4.0)
MCH: 31.7 pg (ref 26.0–34.0)
MCHC: 32.5 g/dL (ref 30.0–36.0)
MCV: 97.8 fL (ref 80.0–100.0)
Monocytes Absolute: 0.7 10*3/uL (ref 0.1–1.0)
Monocytes Relative: 5 %
Neutro Abs: 10.9 10*3/uL — ABNORMAL HIGH (ref 1.7–7.7)
Neutrophils Relative %: 74 %
Platelet Count: 220 10*3/uL (ref 150–400)
RBC: 3.12 MIL/uL — ABNORMAL LOW (ref 3.87–5.11)
RDW: 19.4 % — ABNORMAL HIGH (ref 11.5–15.5)
WBC Count: 14.3 10*3/uL — ABNORMAL HIGH (ref 4.0–10.5)
nRBC: 0.1 % (ref 0.0–0.2)

## 2023-04-04 MED ORDER — MORPHINE SULFATE (CONCENTRATE) 10 MG/0.5ML PO SOLN
20.0000 mg | Freq: Three times a day (TID) | ORAL | 0 refills | Status: DC | PRN
Start: 2023-04-04 — End: 2023-04-04
  Filled 2023-04-04: qty 120, 27d supply, fill #0

## 2023-04-04 MED ORDER — SODIUM CHLORIDE 0.9% FLUSH
10.0000 mL | INTRAVENOUS | Status: DC | PRN
  Administered 2023-04-04: 10 mL via INTRAVENOUS

## 2023-04-04 MED ORDER — HEPARIN SOD (PORK) LOCK FLUSH 100 UNIT/ML IV SOLN
500.0000 [IU] | Freq: Once | INTRAVENOUS | Status: AC
  Administered 2023-04-04: 500 [IU] via INTRAVENOUS

## 2023-04-04 MED ORDER — OXYCODONE HCL 10 MG PO TABS
10.0000 mg | ORAL_TABLET | ORAL | 0 refills | Status: DC | PRN
Start: 2023-04-04 — End: 2023-04-18

## 2023-04-04 MED ORDER — MORPHINE SULFATE (CONCENTRATE) 10 MG/0.5ML PO SOLN
20.0000 mg | Freq: Three times a day (TID) | ORAL | 0 refills | Status: DC | PRN
Start: 2023-04-04 — End: 2023-04-29

## 2023-04-04 MED ORDER — FLUCONAZOLE 100 MG PO TABS
100.0000 mg | ORAL_TABLET | Freq: Every day | ORAL | 0 refills | Status: DC
Start: 2023-04-04 — End: 2023-04-15

## 2023-04-04 NOTE — Progress Notes (Signed)
[facility] OFFICE PROGRESS NOTE   Diagnosis: Pancreas cancer  INTERVAL HISTORY:   Wendy Hoffman returns as scheduled.  She completed cycle 4 FOLFIRINOX 03/22/2023.  She again had nausea the beginning day 2, lasting 4 days.  She had mouth sores and "thrush".  She began constipated, took a laxative, then developed diarrhea.  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  Abdominal pain is unchanged.  Objective:  Vital signs in last 24 hours:  Blood pressure 124/78, pulse 89, temperature 98.1 F (36.7 C), temperature source Oral, resp. rate 20, height  (1.626 m), weight 175 lb (79.4 kg), SpO2 100 %.    HEENT: White coating over tongue.  No buccal thrush. Resp: Lungs clear bilaterally. Cardio: Regular rate and rhythm. GI: Abdomen is soft, tender across the upper abdomen.  No hepatomegaly. Vascular: No leg edema. Neuro: Vibratory sense mildly decreased over the fingertips per tuning fork exam. Skin: Palms without erythema. Portacath without erythema.  Lab Results:  Lab Results  Component Value Date   WBC 14.3 (H) 04/04/2023   HGB 9.9 (L) 04/04/2023   HCT 30.5 (L) 04/04/2023   MCV 97.8 04/04/2023   PLT 220 04/04/2023   NEUTROABS 10.9 (H) 04/04/2023    Imaging:  No results found.  Medications: I have reviewed the patient's current medications.  Assessment/Plan: Pancreas cancer -CT abdomen/pelvis without contrast 02/16/2021-multiple large hypoechoic masses in the patient's liver consistent metastatic disease, ill-defined masslike area in the pancreatic head measuring approximate 4.7 cm, possible filling defect in the right ventricle. -EGD 02/19/2021-large infiltrative mass with bleeding in the second portion of the duodenum, appears to be arising near the ampulla, partially obstructive with friable mucosa.  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 -Flexible sigmoidoscopy  02/19/2021-diverticulosis in the sigmoid colon, nonbleeding external and internal hemorrhoids -Cardiac CT 02/20/2021-mass in the mid RV attached to the interventricular septum measuring 16 mm x 6 mm and concerning for metastatic tumor -MRI abdomen with/without contrast 02/21/2021-mass in the posterior pancreatic head measuring 4.3 x 3.8 cm with obstruction of the central pancreatic duct and diffuse dilatation up to 6 mm consistent with pancreatic adenocarcinoma, liver lesions the largest mass of the central left lobe measuring 9.9 x 9.2 cm consistent with hepatic metastatic disease,  hepatomegaly. -Ultrasound-guided biopsy of a left liver lesion 02/24/2021-adenocarcinoma, morphologically similar to the duodenal biopsy; microsatellite stable, tumor mutation burden 1 -Negative genetic testing -Palliative radiation to the duodenal mass 02/25/2021-03/06/2021 -Cycle 1 FOLFOX 03/17/2021 -Cycle 2 FOLFOX 04/01/2021 -Cycle 3 FOLFIRINOX 04/14/2021 -Cycle 4 FOLFIRINOX 04/27/2021, Udenyca added -Cycle 5 FOLFIRINOX 05/12/2021, Udenyca -MRI abdomen 05/23/2021-decrease in size of pancreas mass and hepatic lesions, no progressive disease -Cycle 6 FOLFIRINOX 05/27/2021 -CT 06/17/2021-multiple liver metastases similar to her prior exam.  Pancreas mass not significantly changed. -CT 06/26/2021-stable pancreas mass, no significant change in size and number of known liver metastases. -CT abdomen/pelvis 07/12/2021-new gastrostomy tube.  Multiple liver masses slightly decreased in size from prior exam.  Pancreas head mass unchanged. -Cycle 1 gemcitabine/Abraxane 07/30/2021 -Cycle 2 gemcitabine/Abraxane 08/13/2021 -Cycle 3 gemcitabine 08/27/2021, Abraxane held due to significant bilateral toe pain, question neuropathy -Cycle 4 gemcitabine, Abraxane held due to neuropathy, 09/10/2021 -Cycle 5 gemcitabine, Abraxane held due to neuropathy, 09/24/2021 -Cycle 6 gemcitabine, Abraxane held due to neuropathy 10/16/2021 -CTs 10/29/2021-mild  progression of multifocal liver metastases; mild increase in size of hypoenhancing mass within the head of the pancreas; new mild pleural nodularity along the posterior lower lobes -Cycle 7  gemcitabine/Abraxane 11/03/2021 -Cycle 8 gemcitabine/Abraxane 11/23/2021 -Cycle 9 gemcitabine/Abraxane 12/10/2021 -Cycle 10 Gemcitabine/Abraxane 12/24/2021 -CT abdomen/pelvis 01/04/2022-decrease size of pancreas head mass, no change in multifocal hepatic metastases -Cycle 11 gemcitabine/Abraxane 01/07/2022 -Cycle 12 gemcitabine/Abraxane 02/04/2022 -Cycle 13 Gemcitabine/Abraxane 02/18/2022 -Cycle 14 gemcitabine/Abraxane 03/11/2022 -Cycle 15 gemcitabine/Abraxane 03/29/2022 -Cycle 16 gemcitabine/Abraxane 04/16/2022 -CT abdomen/pelvis 04/22/2022-mixed response in the liver, some lesions larger, some smaller, no change in pancreas head mass, no evidence of metastatic disease elsewhere in the abdomen or pelvis -Cycle 17 gemcitabine/Abraxane 05/17/2022 -Cycle 18 Gemcitabine/Abraxane 06/01/2022 -Cycle 19 gemcitabine/Abraxane 06/18/2022 -Cycle 20 gemcitabine/Abraxane 07/05/2022 -Cycle 21 gemcitabine/Abraxane 08/09/2022 -Cycle 22 gemcitabine/Abraxane 08/23/2022 -CT abdomen/pelvis 09/01/2022-stable liver metastases, difficult to visualize the pancreas head mass, no evidence of disease progression -Cycle 23 gemcitabine/Abraxane 09/06/2022 -Cycle 24 gemcitabine/Abraxane 09/20/2022 -Cycle 25 gemcitabine/Abraxane 10/18/2022 -Cycle 26 gemcitabine/Abraxane 11/01/2022 -Cycle 27 gemcitabine/Abraxane 11/29/2022 -CT abdomen/pelvis 12/14/2022-increased size of liver metastases, less well-defined pancreas mass, 4 mm right lung nodule -Cycle 1 FOLFIRINOX 01/10/2023 -Cycle 2 FOLFIRINOX 01/24/2023, 5-FU dose reduced secondary to mucositis, chemotherapy plan dose reduce for weight loss -Develop pruritus during the oxaliplatin infusion, initial improvement with Benadryl and Solu-Medrol, oxaliplatin was resumed and the pruritus recurred.  Oxaliplatin was  discontinued. -Chemotherapy held 02/07/2023 due to diarrhea, nausea/vomiting, dehydration -Cycle 3 FOLFIRINOX 03/07/2023 -Cycle 4 FOLFIRINOX 03/22/2023, 5-FU dose reduction due to mucositis, chemotherapy plan dose reduction for weight loss -CTs 03/30/2021-numerous bulky liver lesions, majority are slightly diminished in size though some lesions are unchanged or slightly enlarged. -Cycle 5 FOLFIRINOX 04/05/2023 2.  Iron deficiency anemia-improved -Feraheme 510 mg 02/20/2021 3.  Protein calorie malnutrition 4.  Possible right ventricular filling defect on noncontrast CT scan MRI cardiac morphology 02/21/2021-mass in the mid RV attached to the interventricular septum measures 16 mm x 6 mm.  Mass appears heterogeneous with area of enhancement on LGE imaging.  Mass is concerning for metastatic tumor.  Normal LV size and systolic function.  Normal RV size and systolic function. 5.  Hypertension 6.  History of CVA 7.  Hyperlipidemia 8.  Hypothyroidism 9.  Morbid obesity 10.  Asthma 11.  Migraines 12.  Pain secondary to #1 13.  Port-A-Cath placement 02/25/2021 14.  Hypokalemia 04/01/2021-started a potassium supplement 15.  Hospital admission 06/17/2021-intractable nausea and vomiting EGD 06/19/2021-diffuse gastritis, nonobstructing duodenal mass, biopsy of stomach-no malignancy or H. pylori, duodenal mass-adenocarcinoma 16.  Hospital admission 07/12/2021 with nausea/vomiting, abdominal pain, and hypokalemia 17.  Percutaneous gastrostomy tube placement 07/01/2021 18.  07/21/2022 bilateral lower extremity venous Doppler study-negative for DVT.    Disposition: Ms. Ham appears unchanged.  She has completed 4 cycles of FOLFIRINOX.  Restaging CTs stable to improved.  Report/images reviewed with her at today's visit.  She understands the recommendation is to continue FOLFIRINOX.  Schedule will be adjusted from every 2 weeks to every 3 weeks going forward.  CBC and chemistry panel reviewed.  Labs adequate to  proceed as above.  She will return for lab, follow-up, chemotherapy in 3 weeks.  She will contact the office in the interim with any problems.  Patient seen with Dr. Truett Perna.  Lonna Cobb ANP/GNP-BC   04/04/2023  11:34 AM   This was a shared visit with Lonna Cobb.  We reviewed the CT findings and images with Ms. Peth.  Several of the liver metastases appear smaller.  Others appear stable or slightly enlarged.  The CA 19-9 is lower.  I recommend continuing FOLFIRINOX.  She agrees.  She will be scheduled for a repeat CT after 4 additional cycles.  She will complete another  cycle FOLFIRINOX today.  Ms. Adaline Sill will return for an office visit and chemotherapy in 3 weeks.  I was present for greater than 50% of today's visit.  I performed medical decision making.  Mancel Bale, MD

## 2023-04-05 ENCOUNTER — Inpatient Hospital Stay: Payer: Medicaid Other

## 2023-04-05 ENCOUNTER — Other Ambulatory Visit: Payer: Self-pay

## 2023-04-05 ENCOUNTER — Telehealth: Payer: Self-pay | Admitting: Oncology

## 2023-04-05 ENCOUNTER — Inpatient Hospital Stay: Payer: Medicaid Other | Admitting: Licensed Clinical Social Worker

## 2023-04-05 VITALS — BP 131/78 | HR 73 | Temp 98.0°F | Resp 18

## 2023-04-05 DIAGNOSIS — C250 Malignant neoplasm of head of pancreas: Secondary | ICD-10-CM

## 2023-04-05 DIAGNOSIS — Z5111 Encounter for antineoplastic chemotherapy: Secondary | ICD-10-CM | POA: Diagnosis not present

## 2023-04-05 MED ORDER — DIPHENHYDRAMINE HCL 50 MG/ML IJ SOLN
25.0000 mg | Freq: Once | INTRAMUSCULAR | Status: AC
  Administered 2023-04-05: 25 mg via INTRAVENOUS
  Filled 2023-04-05: qty 1

## 2023-04-05 MED ORDER — OXALIPLATIN CHEMO INJECTION 100 MG/20ML
65.0000 mg/m2 | Freq: Once | INTRAVENOUS | Status: AC
  Administered 2023-04-05: 120 mg via INTRAVENOUS
  Filled 2023-04-05: qty 24

## 2023-04-05 MED ORDER — SODIUM CHLORIDE 0.9 % IV SOLN
150.0000 mg | Freq: Once | INTRAVENOUS | Status: AC
  Administered 2023-04-05: 150 mg via INTRAVENOUS
  Filled 2023-04-05: qty 150

## 2023-04-05 MED ORDER — FAMOTIDINE IN NACL 20-0.9 MG/50ML-% IV SOLN: 20.0000 mg | Freq: Once | INTRAVENOUS | Status: DC | PRN

## 2023-04-05 MED ORDER — PALONOSETRON HCL INJECTION 0.25 MG/5ML
0.2500 mg | Freq: Once | INTRAVENOUS | Status: AC
  Administered 2023-04-05: 0.25 mg via INTRAVENOUS
  Filled 2023-04-05: qty 5

## 2023-04-05 MED ORDER — DIPHENHYDRAMINE HCL 50 MG/ML IJ SOLN
50.0000 mg | Freq: Once | INTRAMUSCULAR | Status: AC | PRN
  Administered 2023-04-05: 50 mg via INTRAVENOUS

## 2023-04-05 MED ORDER — ATROPINE SULFATE 1 MG/ML IV SOLN
0.2500 mg | Freq: Once | INTRAVENOUS | Status: AC
  Administered 2023-04-05: 0.25 mg via INTRAVENOUS
  Filled 2023-04-05: qty 1

## 2023-04-05 MED ORDER — METHYLPREDNISOLONE SODIUM SUCC 125 MG IJ SOLR: 125.0000 mg | Freq: Once | INTRAMUSCULAR | Status: DC | PRN

## 2023-04-05 MED ORDER — FAMOTIDINE IN NACL 20-0.9 MG/50ML-% IV SOLN
20.0000 mg | Freq: Once | INTRAVENOUS | Status: AC
  Administered 2023-04-05: 20 mg via INTRAVENOUS
  Filled 2023-04-05: qty 50

## 2023-04-05 MED ORDER — METHYLPREDNISOLONE SODIUM SUCC 125 MG IJ SOLR
125.0000 mg | Freq: Once | INTRAMUSCULAR | Status: AC
  Administered 2023-04-05: 125 mg via INTRAVENOUS
  Filled 2023-04-05: qty 2

## 2023-04-05 MED ORDER — ATROPINE SULFATE 1 MG/ML IV SOLN
0.5000 mg | Freq: Once | INTRAVENOUS | Status: AC | PRN
  Administered 2023-04-05: 0.5 mg via INTRAVENOUS
  Filled 2023-04-05: qty 1

## 2023-04-05 MED ORDER — SODIUM CHLORIDE 0.9 % IV SOLN
200.0000 mg/m2 | Freq: Once | INTRAVENOUS | Status: AC
  Administered 2023-04-05: 376 mg via INTRAVENOUS
  Filled 2023-04-05: qty 18.8

## 2023-04-05 MED ORDER — SODIUM CHLORIDE 0.9 % IV SOLN
100.0000 mg/m2 | Freq: Once | INTRAVENOUS | Status: AC
  Administered 2023-04-05: 200 mg via INTRAVENOUS
  Filled 2023-04-05: qty 10

## 2023-04-05 MED ORDER — SODIUM CHLORIDE 0.9 % IV SOLN: Freq: Once | INTRAVENOUS | Status: DC | PRN

## 2023-04-05 MED ORDER — EPINEPHRINE 0.3 MG/0.3ML IJ SOAJ: 0.3000 mg | Freq: Once | INTRAMUSCULAR | Status: DC | PRN

## 2023-04-05 MED ORDER — ALBUTEROL SULFATE HFA 108 (90 BASE) MCG/ACT IN AERS: 2.0000 | INHALATION_SPRAY | Freq: Once | RESPIRATORY_TRACT | Status: DC | PRN

## 2023-04-05 MED ORDER — DEXTROSE 5 % IV SOLN: Freq: Once | INTRAVENOUS | Status: AC

## 2023-04-05 MED ORDER — SODIUM CHLORIDE 0.9 % IV SOLN
1000.0000 mg/m2 | INTRAVENOUS | Status: DC
  Administered 2023-04-05: 2000 mg via INTRAVENOUS
  Filled 2023-04-05: qty 40

## 2023-04-05 NOTE — Progress Notes (Signed)
[facility] CSW Progress Note  Visual merchandiser met with patient to assess needs  Patient stated she could not locate the [employer] information or [employer].  CSW contacted Merla Riches regarding the [employer] and gave patient a handout on Pancreatic Grants.  Gave patient her two remaining [employer].  Also provided information on housecleaning services for cancer patients.  She expressed no other needs.    Wendy Lux Tobias Avitabile, LCSW    Patient is participating in a Managed Medicaid Plan:  Yes

## 2023-04-05 NOTE — Progress Notes (Signed)
Per verbal order from Misty Stanley, NP: Atropine 0.25mg  given to patient @ 1620 for abdominal cramping.

## 2023-04-05 NOTE — Telephone Encounter (Signed)
I spoke w pt today and introduced myself as Dance movement psychotherapist and to offer available resources. Discussed one-time $1000 Marketing executive to assist with personal expenses while going through treatment. Advised what is needed and she will bring into DB office. She also wants help to get her set up w in home aid and a push walker w a seat.  I will let DB staff know.  She has my name and number  for any additional financial questions or concerns.

## 2023-04-05 NOTE — Progress Notes (Signed)
Hypersensitivity Reaction note  Date of event: 04/05/23  Time of event: 1230  Generic name of drug involved: Oxaliplatin Tyson Alias)  Name of provider notified of the hypersensitivity reaction: Lonna Cobb, NP  Was agent that likely caused hypersensitivity reaction added to Allergies List within EMR? Yes  Chain of events including reaction signs/symptoms, treatment administered, and outcome (e.g., drug resumed; drug discontinued; sent to Emergency Department; etc.) Patient presents today for D 1 C 5 of Folfirinox with Oxaliplatin to infuse over 4 hours. 1 hour into the Oxaliplatin treatment, patient started complaining of SOB and severe itching to her upper and lower extremities. The chemo was immediately stopped, 0.9% Normal Saline started at 999 ml/hr and Benadryl  injection administered. She denied any other symptoms. No rash noted on patient's skin. Vitals WNL. Misty Stanley, NP assessed patient and ordered that the Oxaliplatin be discontinued and patient may proceed with her Irinotecan and 5FU Pump after itching resolves and patient is stable. Oxaliplatin discontinued. See flowsheet for vitals obtained. Patient tolerated the rest of her chemo treatment without any concerns.    Lorelle Formosa, RN 04/05/2023 2:01 PM

## 2023-04-05 NOTE — Patient Instructions (Signed)
[facility]   Discharge Instructions: Thank you for choosing [facility] to provide your oncology and hematology care.   If you have a lab appointment with the Cancer Center, please go directly to the Cancer Center and check in at the registration area.   Wear comfortable clothing and clothing appropriate for easy access to any Portacath or PICC line.   We strive to give you quality time with your provider. You may need to reschedule your appointment if you arrive late (15 or more minutes).  Arriving late affects you and other patients whose appointments are after yours.  Also, if you miss three or more appointments without notifying the office, you may be dismissed from the clinic at the provider's discretion.      For prescription refill requests, have your pharmacy contact our office and allow 72 hours for refills to be completed.    Today you received the following chemotherapy and/or immunotherapy agents Oxaliplatin (ELOXATIN), Leucovorin, Irinotecan (CAMPTOSAR) & Flourouracil (ADRUCIL).      To help prevent nausea and vomiting after your treatment, we encourage you to take your nausea medication as directed.  BELOW ARE SYMPTOMS THAT SHOULD BE REPORTED IMMEDIATELY: *FEVER GREATER THAN 100.4 F (38 C) OR HIGHER *CHILLS OR SWEATING *NAUSEA AND VOMITING THAT IS NOT CONTROLLED WITH YOUR NAUSEA MEDICATION *UNUSUAL SHORTNESS OF BREATH *UNUSUAL BRUISING OR BLEEDING *URINARY PROBLEMS (pain or burning when urinating, or frequent urination) *BOWEL PROBLEMS (unusual diarrhea, constipation, pain near the anus) TENDERNESS IN MOUTH AND THROAT WITH OR WITHOUT PRESENCE OF ULCERS (sore throat, sores in mouth, or a toothache) UNUSUAL RASH, SWELLING OR PAIN  UNUSUAL VAGINAL DISCHARGE OR ITCHING   Items with * indicate a potential emergency and should be followed up as soon as possible or go to the Emergency Department if any problems should  occur.  Please show the CHEMOTHERAPY ALERT CARD or IMMUNOTHERAPY ALERT CARD at check-in to the Emergency Department and triage nurse.  Should you have questions after your visit or need to cancel or reschedule your appointment, please contact [facility]  Dept: [phone]  and follow the prompts.  Office hours are 8:00 a.m. to 4:30 p.m. Monday - Friday. Please note that voicemails left after 4:00 p.m. may not be returned until the following business day.  We are closed weekends and major holidays. You have access to a nurse at all times for urgent questions. Please call the main number to the clinic Dept: [phone] and follow the prompts.   For any non-urgent questions, you may also contact your provider using MyChart. We now offer e-Visits for anyone 41 and older to request care online for non-urgent symptoms. For details visit [email or web address].   Also download the MyChart app! Go to the app store, search "MyChart", open the app, select [facility], and log in with your MyChart username and password.  Oxaliplatin Injection What is this medication? OXALIPLATIN (ox AL i PLA tin) treats colorectal cancer. It works by slowing down the growth of cancer cells. This medicine may be used for other purposes; ask your health care provider or pharmacist if you have questions. COMMON BRAND NAME(S): Eloxatin What should I tell my care team before I take this medication? They need to know if you have any of these conditions: Heart disease History of irregular heartbeat or rhythm Liver disease Low blood cell levels (white cells, red cells, and platelets) Lung or breathing disease, such as asthma Take medications  that treat or prevent blood clots Tingling of the fingers, toes, or other nerve disorder An unusual or allergic reaction to oxaliplatin, other medications, foods, dyes, or preservatives If you or your partner are pregnant or trying to get  pregnant Breast-feeding How should I use this medication? This medication is injected into a vein. It is given by your care team in a hospital or clinic setting. Talk to your care team about the use of this medication in children. Special care may be needed. Overdosage: If you think you have taken too much of this medicine contact a poison control center or emergency room at once. NOTE: This medicine is only for you. Do not share this medicine with others. What if I miss a dose? Keep appointments for follow-up doses. It is important not to miss a dose. Call your care team if you are unable to keep an appointment. What may interact with this medication? Do not take this medication with any of the following: Cisapride Dronedarone Pimozide Thioridazine This medication may also interact with the following: Aspirin and aspirin-like medications Certain medications that treat or prevent blood clots, such as warfarin, apixaban, dabigatran, and rivaroxaban Cisplatin Cyclosporine Diuretics Medications for infection, such as acyclovir, adefovir, amphotericin B, bacitracin, cidofovir, foscarnet, ganciclovir, gentamicin, pentamidine, vancomycin NSAIDs, medications for pain and inflammation, such as ibuprofen or naproxen Other medications that cause heart rhythm changes Pamidronate Zoledronic acid This list may not describe all possible interactions. Give your health care provider a list of all the medicines, herbs, non-prescription drugs, or dietary supplements you use. Also tell them if you smoke, drink alcohol, or use illegal drugs. Some items may interact with your medicine. What should I watch for while using this medication? Your condition will be monitored carefully while you are receiving this medication. You may need blood work while taking this medication. This medication may make you feel generally unwell. This is not uncommon as chemotherapy can affect healthy cells as well as cancer  cells. Report any side effects. Continue your course of treatment even though you feel ill unless your care team tells you to stop. This medication may increase your risk of getting an infection. Call your care team for advice if you get a fever, chills, sore throat, or other symptoms of a cold or flu. Do not treat yourself. Try to avoid being around people who are sick. Avoid taking medications that contain aspirin, acetaminophen, ibuprofen, naproxen, or ketoprofen unless instructed by your care team. These medications may hide a fever. Be careful brushing or flossing your teeth or using a toothpick because you may get an infection or bleed more easily. If you have any dental work done, tell your dentist you are receiving this medication. This medication can make you more sensitive to cold. Do not drink cold drinks or use ice. Cover exposed skin before coming in contact with cold temperatures or cold objects. When out in cold weather wear warm clothing and cover your mouth and nose to warm the air that goes into your lungs. Tell your care team if you get sensitive to the cold. Talk to your care team if you or your partner are pregnant or think either of you might be pregnant. This medication can cause serious birth defects if taken during pregnancy and for 9 months after the last dose. A negative pregnancy test is required before starting this medication. A reliable form of contraception is recommended while taking this medication and for 9 months after the last dose. Talk to  your care team about effective forms of contraception. Do not father a child while taking this medication and for 6 months after the last dose. Use a condom while having sex during this time period. Do not breastfeed while taking this medication and for 3 months after the last dose. This medication may cause infertility. Talk to your care team if you are concerned about your fertility. What side effects may I notice from receiving this  medication? Side effects that you should report to your care team as soon as possible: Allergic reactions--skin rash, itching, hives, swelling of the face, lips, tongue, or throat Bleeding--bloody or black, tar-like stools, vomiting blood or brown material that looks like coffee grounds, red or dark brown urine, small red or purple spots on skin, unusual bruising or bleeding Dry cough, shortness of breath or trouble breathing Heart rhythm changes--fast or irregular heartbeat, dizziness, feeling faint or lightheaded, chest pain, trouble breathing Infection--fever, chills, cough, sore throat, wounds that don't heal, pain or trouble when passing urine, general feeling of discomfort or being unwell Liver injury--right upper belly pain, loss of appetite, nausea, light-colored stool, dark yellow or brown urine, yellowing skin or eyes, unusual weakness or fatigue Low red blood cell level--unusual weakness or fatigue, dizziness, headache, trouble breathing Muscle injury--unusual weakness or fatigue, muscle pain, dark yellow or brown urine, decrease in amount of urine Pain, tingling, or numbness in the hands or feet Sudden and severe headache, confusion, change in vision, seizures, which may be signs of posterior reversible encephalopathy syndrome (PRES) Unusual bruising or bleeding Side effects that usually do not require medical attention (report to your care team if they continue or are bothersome): Diarrhea Nausea Pain, redness, or swelling with sores inside the mouth or throat Unusual weakness or fatigue Vomiting This list may not describe all possible side effects. Call your doctor for medical advice about side effects. You may report side effects to FDA at 1-800-FDA-1088. Where should I keep my medication? This medication is given in a hospital or clinic. It will not be stored at home. NOTE: This sheet is a summary. It may not cover all possible information. If you have questions about this  medicine, talk to your doctor, pharmacist, or health care provider.  2023 Elsevier/Gold Standard (2008-01-20 00:00:00) Irinotecan Injection What is this medication? IRINOTECAN (ir in oh TEE kan) treats some types of cancer. It works by slowing down the growth of cancer cells. This medicine may be used for other purposes; ask your health care provider or pharmacist if you have questions. COMMON BRAND NAME(S): Camptosar What should I tell my care team before I take this medication? They need to know if you have any of these conditions: Dehydration Diarrhea Infection, especially a viral infection, such as chickenpox, cold sores, herpes Liver disease Low blood cell levels (white cells, red cells, and platelets) Low levels of electrolytes, such as calcium, magnesium, or potassium in your blood Recent or ongoing radiation An unusual or allergic reaction to irinotecan, other medications, foods, dyes, or preservatives If you or your partner are pregnant or trying to get pregnant Breast-feeding How should I use this medication? This medication is injected into a vein. It is given by your care team in a hospital or clinic setting. Talk to your care team about the use of this medication in children. Special care may be needed. Overdosage: If you think you have taken too much of this medicine contact a poison control center or emergency room at once. NOTE: This medicine is  only for you. Do not share this medicine with others. What if I miss a dose? Keep appointments for follow-up doses. It is important not to miss your dose. Call your care team if you are unable to keep an appointment. What may interact with this medication? Do not take this medication with any of the following: Cobicistat Itraconazole This medication may also interact with the following: Certain antibiotics, such as clarithromycin, rifampin, rifabutin Certain antivirals for HIV or AIDS Certain medications for fungal  infections, such as ketoconazole, posaconazole, voriconazole Certain medications for seizures, such as carbamazepine, phenobarbital, phenytoin Gemfibrozil Nefazodone St. John's wort This list may not describe all possible interactions. Give your health care provider a list of all the medicines, herbs, non-prescription drugs, or dietary supplements you use. Also tell them if you smoke, drink alcohol, or use illegal drugs. Some items may interact with your medicine. What should I watch for while using this medication? Your condition will be monitored carefully while you are receiving this medication. You may need blood work while taking this medication. This medication may make you feel generally unwell. This is not uncommon as chemotherapy can affect healthy cells as well as cancer cells. Report any side effects. Continue your course of treatment even though you feel ill unless your care team tells you to stop. This medication can cause serious side effects. To reduce the risk, your care team may give you other medications to take before receiving this one. Be sure to follow the directions from your care team. This medication may affect your coordination, reaction time, or judgement. Do not drive or operate machinery until you know how this medication affects you. Sit up or stand slowly to reduce the risk of dizzy or fainting spells. Drinking alcohol with this medication can increase the risk of these side effects. This medication may increase your risk of getting an infection. Call your care team for advice if you get a fever, chills, sore throat, or other symptoms of a cold or flu. Do not treat yourself. Try to avoid being around people who are sick. Avoid taking medications that contain aspirin, acetaminophen, ibuprofen, naproxen, or ketoprofen unless instructed by your care team. These medications may hide a fever. This medication may increase your risk to bruise or bleed. Call your care team if you  notice any unusual bleeding. Be careful brushing or flossing your teeth or using a toothpick because you may get an infection or bleed more easily. If you have any dental work done, tell your dentist you are receiving this medication. Talk to your care team if you or your partner are pregnant or think either of you might be pregnant. This medication can cause serious birth defects if taken during pregnancy and for 6 months after the last dose. You will need a negative pregnancy test before starting this medication. Contraception is recommended while taking this medication and for 6 months after the last dose. Your care team can help you find the option that works for you. Do not father a child while taking this medication and for 3 months after the last dose. Use a condom for contraception during this time period. Do not breastfeed while taking this medication and for 7 days after the last dose. This medication may cause infertility. Talk to your care team if you are concerned about your fertility. What side effects may I notice from receiving this medication? Side effects that you should report to your care team as soon as possible: Allergic reactions--skin rash,  itching, hives, swelling of the face, lips, tongue, or throat Dry cough, shortness of breath or trouble breathing Increased saliva or tears, increased sweating, stomach cramping, diarrhea, small pupils, unusual weakness or fatigue, slow heartbeat Infection--fever, chills, cough, sore throat, wounds that don't heal, pain or trouble when passing urine, general feeling of discomfort or being unwell Kidney injury--decrease in the amount of urine, swelling of the ankles, hands, or feet Low red blood cell level--unusual weakness or fatigue, dizziness, headache, trouble breathing Severe or prolonged diarrhea Unusual bruising or bleeding Side effects that usually do not require medical attention (report to your care team if they continue or are  bothersome): Constipation Diarrhea Hair loss Loss of appetite Nausea Stomach pain This list may not describe all possible side effects. Call your doctor for medical advice about side effects. You may report side effects to FDA at 1-800-FDA-1088. Where should I keep my medication? This medication is given in a hospital or clinic. It will not be stored at home. NOTE: This sheet is a summary. It may not cover all possible information. If you have questions about this medicine, talk to your doctor, pharmacist, or health care provider.  2023 Elsevier/Gold Standard (2022-04-08 00:00:00)  Leucovorin Injection What is this medication? LEUCOVORIN (loo koe VOR in) prevents side effects from certain medications, such as methotrexate. It works by increasing folate levels. This helps protect healthy cells in your body. It may also be used to treat anemia caused by low levels of folate. It can also be used with fluorouracil, a type of chemotherapy, to treat colorectal cancer. It works by increasing the effects of fluorouracil in the body. This medicine may be used for other purposes; ask your health care provider or pharmacist if you have questions. What should I tell my care team before I take this medication? They need to know if you have any of these conditions: Anemia from low levels of vitamin B12 in the blood An unusual or allergic reaction to leucovorin, folic acid, other medications, foods, dyes, or preservatives Pregnant or trying to get pregnant Breastfeeding How should I use this medication? This medication is injected into a vein or a muscle. It is given by your care team in a hospital or clinic setting. Talk to your care team about the use of this medication in children. Special care may be needed. Overdosage: If you think you have taken too much of this medicine contact a poison control center or emergency room at once. NOTE: This medicine is only for you. Do not share this medicine with  others. What if I miss a dose? Keep appointments for follow-up doses. It is important not to miss your dose. Call your care team if you are unable to keep an appointment. What may interact with this medication? Capecitabine Fluorouracil Phenobarbital Phenytoin Primidone Trimethoprim;sulfamethoxazole This list may not describe all possible interactions. Give your health care provider a list of all the medicines, herbs, non-prescription drugs, or dietary supplements you use. Also tell them if you smoke, drink alcohol, or use illegal drugs. Some items may interact with your medicine. What should I watch for while using this medication? Your condition will be monitored carefully while you are receiving this medication. This medication may increase the side effects of 5-fluorouracil. Tell your care team if you have diarrhea or mouth sores that do not get better or that get worse. What side effects may I notice from receiving this medication? Side effects that you should report to your care team as soon  as possible: Allergic reactions--skin rash, itching, hives, swelling of the face, lips, tongue, or throat This list may not describe all possible side effects. Call your doctor for medical advice about side effects. You may report side effects to FDA at 1-800-FDA-1088. Where should I keep my medication? This medication is given in a hospital or clinic. It will not be stored at home. NOTE: This sheet is a summary. It may not cover all possible information. If you have questions about this medicine, talk to your doctor, pharmacist, or health care provider.  2023 Elsevier/Gold Standard (2022-04-09 00:00:00)  Fluorouracil Injection What is this medication? FLUOROURACIL (flure oh YOOR a sil) treats some types of cancer. It works by slowing down the growth of cancer cells. This medicine may be used for other purposes; ask your health care provider or pharmacist if you have questions. COMMON BRAND  NAME(S): Adrucil What should I tell my care team before I take this medication? They need to know if you have any of these conditions: Blood disorders Dihydropyrimidine dehydrogenase (DPD) deficiency Infection, such as chickenpox, cold sores, herpes Kidney disease Liver disease Poor nutrition Recent or ongoing radiation therapy An unusual or allergic reaction to fluorouracil, other medications, foods, dyes, or preservatives If you or your partner are pregnant or trying to get pregnant Breast-feeding How should I use this medication? This medication is injected into a vein. It is administered by your care team in a hospital or clinic setting. Talk to your care team about the use of this medication in children. Special care may be needed. Overdosage: If you think you have taken too much of this medicine contact a poison control center or emergency room at once. NOTE: This medicine is only for you. Do not share this medicine with others. What if I miss a dose? Keep appointments for follow-up doses. It is important not to miss your dose. Call your care team if you are unable to keep an appointment. What may interact with this medication? Do not take this medication with any of the following: Live virus vaccines This medication may also interact with the following: Medications that treat or prevent blood clots, such as warfarin, enoxaparin, dalteparin This list may not describe all possible interactions. Give your health care provider a list of all the medicines, herbs, non-prescription drugs, or dietary supplements you use. Also tell them if you smoke, drink alcohol, or use illegal drugs. Some items may interact with your medicine. What should I watch for while using this medication? Your condition will be monitored carefully while you are receiving this medication. This medication may make you feel generally unwell. This is not uncommon as chemotherapy can affect healthy cells as well as  cancer cells. Report any side effects. Continue your course of treatment even though you feel ill unless your care team tells you to stop. In some cases, you may be given additional medications to help with side effects. Follow all directions for their use. This medication may increase your risk of getting an infection. Call your care team for advice if you get a fever, chills, sore throat, or other symptoms of a cold or flu. Do not treat yourself. Try to avoid being around people who are sick. This medication may increase your risk to bruise or bleed. Call your care team if you notice any unusual bleeding. Be careful brushing or flossing your teeth or using a toothpick because you may get an infection or bleed more easily. If you have any dental work done,  tell your dentist you are receiving this medication. Avoid taking medications that contain aspirin, acetaminophen, ibuprofen, naproxen, or ketoprofen unless instructed by your care team. These medications may hide a fever. Do not treat diarrhea with over the counter products. Contact your care team if you have diarrhea that lasts more than 2 days or if it is severe and watery. This medication can make you more sensitive to the sun. Keep out of the sun. If you cannot avoid being in the sun, wear protective clothing and sunscreen. Do not use sun lamps, tanning beds, or tanning booths. Talk to your care team if you or your partner wish to become pregnant or think you might be pregnant. This medication can cause serious birth defects if taken during pregnancy and for 3 months after the last dose. A reliable form of contraception is recommended while taking this medication and for 3 months after the last dose. Talk to your care team about effective forms of contraception. Do not father a child while taking this medication and for 3 months after the last dose. Use a condom while having sex during this time period. Do not breastfeed while taking this  medication. This medication may cause infertility. Talk to your care team if you are concerned about your fertility. What side effects may I notice from receiving this medication? Side effects that you should report to your care team as soon as possible: Allergic reactions--skin rash, itching, hives, swelling of the face, lips, tongue, or throat Heart attack--pain or tightness in the chest, shoulders, arms, or jaw, nausea, shortness of breath, cold or clammy skin, feeling faint or lightheaded Heart failure--shortness of breath, swelling of the ankles, feet, or hands, sudden weight gain, unusual weakness or fatigue Heart rhythm changes--fast or irregular heartbeat, dizziness, feeling faint or lightheaded, chest pain, trouble breathing High ammonia level--unusual weakness or fatigue, confusion, loss of appetite, nausea, vomiting, seizures Infection--fever, chills, cough, sore throat, wounds that don't heal, pain or trouble when passing urine, general feeling of discomfort or being unwell Low red blood cell level--unusual weakness or fatigue, dizziness, headache, trouble breathing Pain, tingling, or numbness in the hands or feet, muscle weakness, change in vision, confusion or trouble speaking, loss of balance or coordination, trouble walking, seizures Redness, swelling, and blistering of the skin over hands and feet Severe or prolonged diarrhea Unusual bruising or bleeding Side effects that usually do not require medical attention (report to your care team if they continue or are bothersome): Dry skin Headache Increased tears Nausea Pain, redness, or swelling with sores inside the mouth or throat Sensitivity to light Vomiting This list may not describe all possible side effects. Call your doctor for medical advice about side effects. You may report side effects to FDA at 1-800-FDA-1088. Where should I keep my medication? This medication is given in a hospital or clinic. It will not be stored  at home. NOTE: This sheet is a summary. It may not cover all possible information. If you have questions about this medicine, talk to your doctor, pharmacist, or health care provider.  2023 Elsevier/Gold Standard (2022-03-30 00:00:00)  The chemotherapy medication bag should finish at 46 hours, 96 hours, or 7 days. For example, if your pump is scheduled for 46 hours and it was put on at 4:00 p.m., it should finish at 2:00 p.m. the day it is scheduled to come off regardless of your appointment time.     Estimated time to finish at 2:00 p.m. on Thursday 04/07/2023.   If the  display on your pump reads "Low Volume" and it is beeping, take the batteries out of the pump and come to the cancer center for it to be taken off.   If the pump alarms go off prior to the pump reading "Low Volume" then call [phone] and someone can assist you.  If the plunger comes out and the chemotherapy medication is leaking out, please use your home chemo spill kit to clean up the spill. Do NOT use paper towels or other household products.  If you have problems or questions regarding your pump, please call either [phone] (24 hours a day) or the cancer center Monday-Friday 8:00 a.m.- 4:30 p.m. at the clinic number and we will assist you. If you are unable to get assistance, then go to the nearest Emergency Department and ask the staff to contact the IV team for assistance.

## 2023-04-06 ENCOUNTER — Telehealth: Payer: Self-pay

## 2023-04-06 LAB — CANCER ANTIGEN 19-9: CA 19-9: 9902 U/mL — ABNORMAL HIGH (ref 0–35)

## 2023-04-06 NOTE — Telephone Encounter (Signed)
I contacted the patient to discuss the status of ordering a walker with a seat for her. Unfortunately, the patient's insurance denied the request as she received a walker on July 06, 2021, and insurance will not cover another one until July 07, 2024. The patient reported experiencing significant discomfort in her legs after undergoing chemotherapy, with both legs feeling extremely stiff and painful, preventing her from walking throughout the night until the following day. The patient rated her leg pain as a 10 out of 10, describing it as sharp and shooting up from her ankle.

## 2023-04-07 ENCOUNTER — Inpatient Hospital Stay: Payer: Medicaid Other

## 2023-04-07 ENCOUNTER — Other Ambulatory Visit: Payer: Self-pay | Admitting: Oncology

## 2023-04-07 VITALS — BP 112/71 | HR 84 | Temp 96.8°F | Resp 18

## 2023-04-07 DIAGNOSIS — Z5111 Encounter for antineoplastic chemotherapy: Secondary | ICD-10-CM | POA: Diagnosis not present

## 2023-04-07 DIAGNOSIS — C250 Malignant neoplasm of head of pancreas: Secondary | ICD-10-CM

## 2023-04-07 MED ORDER — HEPARIN SOD (PORK) LOCK FLUSH 100 UNIT/ML IV SOLN
500.0000 [IU] | Freq: Once | INTRAVENOUS | Status: AC | PRN
  Administered 2023-04-07: 500 [IU]

## 2023-04-07 MED ORDER — PEGFILGRASTIM-CBQV 6 MG/0.6ML ~~LOC~~ SOSY
6.0000 mg | PREFILLED_SYRINGE | Freq: Once | SUBCUTANEOUS | Status: AC
  Administered 2023-04-07: 6 mg via SUBCUTANEOUS
  Filled 2023-04-07: qty 0.6

## 2023-04-07 MED ORDER — SODIUM CHLORIDE 0.9% FLUSH
10.0000 mL | INTRAVENOUS | Status: DC | PRN
  Administered 2023-04-07: 10 mL

## 2023-04-07 NOTE — Patient Instructions (Signed)

## 2023-04-08 ENCOUNTER — Other Ambulatory Visit: Payer: Self-pay | Admitting: Nurse Practitioner

## 2023-04-08 ENCOUNTER — Inpatient Hospital Stay: Payer: Medicaid Other

## 2023-04-08 DIAGNOSIS — C259 Malignant neoplasm of pancreas, unspecified: Secondary | ICD-10-CM

## 2023-04-08 DIAGNOSIS — C250 Malignant neoplasm of head of pancreas: Secondary | ICD-10-CM

## 2023-04-08 MED ORDER — MORPHINE SULFATE ER 60 MG PO TBCR
60.0000 mg | EXTENDED_RELEASE_TABLET | Freq: Two times a day (BID) | ORAL | 0 refills | Status: DC
Start: 2023-04-08 — End: 2023-05-10

## 2023-04-08 NOTE — Progress Notes (Signed)
[facility] CSW Progress Note  Clinical Child psychotherapist contacted patient by phone to discuss needs.  She informed Merla Riches, the financial counselor, that she wanted a walker with a seat.  Patient stated since she received her walker in 2022, she was not eligible for a new one.  CSW provided her with the name and number of the [employer] for free DME.  She stated she would contact them.  Also dicussed home care needs.  She said she needed someone to stay with her while she took a shower so she didn't fall and someone to help with cooking, cleaning, etc.  CSW mailed her the Medicaid application for home care.  She expressed no other needs.    Pascal Lux Sheena Simonis, LCSW    Patient is participating in a Managed Medicaid Plan:  Yes

## 2023-04-12 ENCOUNTER — Encounter: Payer: Self-pay | Admitting: Family

## 2023-04-12 ENCOUNTER — Emergency Department (HOSPITAL_BASED_OUTPATIENT_CLINIC_OR_DEPARTMENT_OTHER)
Admission: EM | Admit: 2023-04-12 | Discharge: 2023-04-12 | Discharge status: Against Medical Advice | Payer: Medicaid Other | Attending: Emergency Medicine | Admitting: Emergency Medicine

## 2023-04-12 ENCOUNTER — Encounter (HOSPITAL_BASED_OUTPATIENT_CLINIC_OR_DEPARTMENT_OTHER): Payer: Self-pay | Admitting: Emergency Medicine

## 2023-04-12 ENCOUNTER — Emergency Department (HOSPITAL_BASED_OUTPATIENT_CLINIC_OR_DEPARTMENT_OTHER): Payer: Medicaid Other

## 2023-04-12 ENCOUNTER — Ambulatory Visit: Payer: Medicaid Other | Admitting: Family

## 2023-04-12 ENCOUNTER — Other Ambulatory Visit: Payer: Self-pay

## 2023-04-12 VITALS — BP 127/71 | HR 98 | Ht 64.0 in | Wt 196.0 lb

## 2023-04-12 DIAGNOSIS — M79604 Pain in right leg: Secondary | ICD-10-CM

## 2023-04-12 DIAGNOSIS — C250 Malignant neoplasm of head of pancreas: Secondary | ICD-10-CM

## 2023-04-12 DIAGNOSIS — R7989 Other specified abnormal findings of blood chemistry: Secondary | ICD-10-CM | POA: Insufficient documentation

## 2023-04-12 DIAGNOSIS — C259 Malignant neoplasm of pancreas, unspecified: Secondary | ICD-10-CM | POA: Insufficient documentation

## 2023-04-12 DIAGNOSIS — D72829 Elevated white blood cell count, unspecified: Secondary | ICD-10-CM | POA: Insufficient documentation

## 2023-04-12 DIAGNOSIS — R600 Localized edema: Secondary | ICD-10-CM | POA: Insufficient documentation

## 2023-04-12 DIAGNOSIS — M79662 Pain in left lower leg: Secondary | ICD-10-CM | POA: Insufficient documentation

## 2023-04-12 LAB — CBC
HCT: 29.5 % — ABNORMAL LOW (ref 36.0–46.0)
Hemoglobin: 9.4 g/dL — ABNORMAL LOW (ref 12.0–15.0)
MCH: 31.3 pg (ref 26.0–34.0)
MCHC: 31.9 g/dL (ref 30.0–36.0)
MCV: 98.3 fL (ref 80.0–100.0)
Platelets: 238 10*3/uL (ref 150–400)
RBC: 3 MIL/uL — ABNORMAL LOW (ref 3.87–5.11)
RDW: 20.1 % — ABNORMAL HIGH (ref 11.5–15.5)
WBC: 35.3 10*3/uL — ABNORMAL HIGH (ref 4.0–10.5)
nRBC: 0 % (ref 0.0–0.2)

## 2023-04-12 LAB — BASIC METABOLIC PANEL
Anion gap: 7 (ref 5–15)
BUN: 16 mg/dL (ref 6–20)
CO2: 29 mmol/L (ref 22–32)
Calcium: 8.6 mg/dL — ABNORMAL LOW (ref 8.9–10.3)
Chloride: 102 mmol/L (ref 98–111)
Creatinine, Ser: 0.97 mg/dL (ref 0.44–1.00)
GFR, Estimated: >60 mL/min (ref >60)
Glucose, Bld: 127 mg/dL — ABNORMAL HIGH (ref 70–99)
Potassium: 3.9 mmol/L (ref 3.5–5.1)
Sodium: 138 mmol/L (ref 135–145)

## 2023-04-12 LAB — LACTIC ACID, PLASMA: Lactic Acid, Venous: 1.8 mmol/L (ref 0.5–1.9)

## 2023-04-12 LAB — TROPONIN I (HIGH SENSITIVITY): Troponin I (High Sensitivity): 6 ng/L (ref <18)

## 2023-04-12 LAB — BRAIN NATRIURETIC PEPTIDE: B Natriuretic Peptide: 281.7 pg/mL — ABNORMAL HIGH (ref 0.0–100.0)

## 2023-04-12 MED ORDER — HEPARIN SOD (PORK) LOCK FLUSH 100 UNIT/ML IV SOLN
500.0000 [IU] | Freq: Once | INTRAVENOUS | Status: AC
  Administered 2023-04-12: 500 [IU]
  Filled 2023-04-12: qty 5

## 2023-04-12 MED ORDER — IOHEXOL 300 MG/ML  SOLN
100.0000 mL | Freq: Once | INTRAMUSCULAR | Status: AC | PRN
  Administered 2023-04-12: 100 mL via INTRAVENOUS

## 2023-04-12 NOTE — ED Provider Notes (Signed)
[place] [facility] AT [facility] Provider Note   CSN: [id number] Arrival date & time: 04/12/23  1421     History Chief Complaint  Patient presents with   Leg Pain    Wendy Hoffman is a 57 y.o. female.  Patient with past history significant for stage IV pancreatic cancer presents emergency department complaints of leg pain.  She reports that she has been experiencing right lower leg pain started on the mid calf extending into the thigh for the last 3 to 4 days.  She was sent by her PCP today for evaluation for possible blood clots in this area.  Patient is currently receiving chemotherapy.  Not currently on any blood thinners.  No prior history of any DVTs or clots all score.  Reports some occasional exertional chest pain but denies any dyspnea.  No recent fevers, congestion, cough, viral URI symptoms.   Last infusion note from 04/05/2023 patient received Folfirinox and Oxaliplatin. Oxaliplatin discontinued due to patient delivering severe itching in upper and lower extremities.  Leg Pain      Home Medications Prior to Admission medications   Medication Sig Start Date End Date Taking? Authorizing Provider  albuterol (VENTOLIN HFA) 108 (90 Base) MCG/ACT inhaler INHALE 1 TO 2 PUFFS BY MOUTH EVERY 6 HOURS AS NEEDED FOR WHEEZING AND FOR SHORTNESS OF BREATH 03/07/23 03/06/24  Ladene Artist, MD  bisacodyl (DULCOLAX) 5 MG EC tablet Take 2 tablets (10 mg total) by mouth daily as needed for moderate constipation. 03/07/23   Ladene Artist, MD  dexamethasone (DECADRON) 2 MG tablet Take 5 tabs (10mg ) at 10pm night prior to chemotherapy, 02/20/2023 02/07/23   Rana Snare, NP  DULoxetine (CYMBALTA) 30 MG capsule TAKE 2 CAPSULES BY MOUTH DAILY 04/07/23   Ladene Artist, MD  fluconazole (DIFLUCAN) 100 MG tablet Take 1 tablet (100 mg total) by mouth daily. X 4 days 04/04/23   Rana Snare, NP  gabapentin (NEURONTIN) 300 MG capsule TAKE 1 CAPSULE BY MOUTH EVERY NIGHT  02/28/23   Ladene Artist, MD  lidocaine-prilocaine (EMLA) cream Apply 1 tablespoon to port site 2 hours prior to stick to numb site. 03/07/23   Ladene Artist, MD  magic mouthwash (nystatin, diphenhydrAMINE, alum & mag hydroxide) suspension mixture Swish 5 mL for 1 minute and spit four times daily as needed for mouth pain. 01/18/23   Ladene Artist, MD  morphine (MS CONTIN) 60 MG 12 hr tablet Take 1 tablet (60 mg total) by mouth every 12 (twelve) hours. 04/08/23   Rana Snare, NP  Morphine Sulfate (MORPHINE CONCENTRATE) 10 MG/0.5ML SOLN concentrated solution Place 1-1.5 mLs (20-30 mg total) under the tongue every 8 (eight) hours as needed for severe pain (pain not relieved with oxycodone). 04/04/23   Rana Snare, NP  ondansetron (ZOFRAN) 8 MG tablet Take 1 tablet (8 mg total) by mouth every 8 (eight) hours as needed for nausea or vomiting. 03/07/23   Ladene Artist, MD  ondansetron (ZOFRAN-ODT) 8 MG disintegrating tablet Take 1 tablet (8 mg total) by mouth every 8 (eight) hours as needed for nausea or vomiting. 03/07/23   Ladene Artist, MD  Oxycodone HCl 10 MG TABS Take 1-2 tablets (10-20 mg total) by mouth every 4 (four) hours as needed (Pain). 04/04/23   Rana Snare, NP  pantoprazole (PROTONIX) 40 MG tablet TAKE 1 TABLET(40 MG) BY MOUTH TWICE DAILY 04/07/23   Ladene Artist, MD  potassium chloride (KLOR-CON M) 10  MEQ tablet Take 1 tablet (10 mEq total) by mouth 2 (two) times daily. 12/14/22   Ladene Artist, MD  predniSONE (DELTASONE) 10 MG tablet Take 1 tablet (10 mg total) by mouth daily with breakfast. 02/07/23   Rana Snare, NP  prochlorperazine (COMPAZINE) 10 MG tablet TAKE 1 TABLET(10 MG) BY MOUTH EVERY 6 HOURS AS NEEDED FOR NAUSEA OR VOMITING 03/07/23   Ladene Artist, MD  scopolamine (TRANSDERM-SCOP) 1 MG/3DAYS Place 1 patch (1.5 mg total) onto the skin every 3 (three) days. 03/07/23   Ladene Artist, MD  SYMBICORT 160-4.5 MCG/ACT inhaler Inhale 2 puffs into the lungs 2 (two)  times daily. 03/07/23   Ladene Artist, MD  zolpidem (AMBIEN) 10 MG tablet Take 1 tablet (10 mg total) by mouth at bedtime as needed for sleep. 03/07/23   Ladene Artist, MD      Allergies    Blueberry [vaccinium angustifolium], Mango flavor, Oxaliplatin, Reglan [metoclopramide], Aspirin, and Penicillins    Review of Systems   Review of Systems  Cardiovascular:  Positive for leg swelling.  All other systems reviewed and are negative.   Physical Exam Updated Vital Signs BP 122/67 (BP Location: Left Arm)   Pulse 90   Temp 98.6 F (37 C) (Oral)   Resp 20   SpO2 99%  Physical Exam Vitals and nursing note reviewed.  Constitutional:      General: She is not in acute distress.    Appearance: Normal appearance. She is not ill-appearing.  Cardiovascular:     Rate and Rhythm: Normal rate and regular rhythm.  Pulmonary:     Effort: Pulmonary effort is normal. No respiratory distress.     Breath sounds: Normal breath sounds.  Abdominal:     General: Abdomen is flat.  Musculoskeletal:        General: Tenderness present. Normal range of motion.     Right lower leg: No edema.     Left lower leg: No edema.     Comments: Tenderness to palpation of the right lower leg, minimal edema present  Skin:    General: Skin is warm and dry.     Capillary Refill: Capillary refill takes less than 2 seconds.  Neurological:     General: No focal deficit present.     Mental Status: She is alert.     ED Results / Procedures / Treatments   Labs (all labs ordered are listed, but only abnormal results are displayed) Labs Reviewed  CBC - Abnormal; Notable for the following components:      Result Value   WBC 35.3 (*)    RBC 3.00 (*)    Hemoglobin 9.4 (*)    HCT 29.5 (*)    RDW 20.1 (*)    All other components within normal limits  BASIC METABOLIC PANEL  BRAIN NATRIURETIC PEPTIDE  TROPONIN I (HIGH SENSITIVITY)  TROPONIN I (HIGH SENSITIVITY)    EKG EKG  Interpretation  Date/Time:  Tuesday April 12 2023 14:45:14 EDT Ventricular Rate:  89 PR Interval:  128 QRS Duration: 89 QT Interval:  353 QTC Calculation: 430 R Axis:   -10 Text Interpretation: Sinus rhythm when comapred to prior, similar appearance. NO STEMI Confirmed by Theda Belfast ([id number]) on 04/12/2023 3:48:32 PM  Radiology DG Chest 2 View  Result Date: 04/12/2023 CLINICAL DATA:  Right upper and lower leg pain and swelling. Some chest pain. EXAM: CHEST - 2 VIEW COMPARISON:  Chest radiographs 02/02/2022 FINDINGS: Right chest wall porta catheter is again  seen with tip again likely terminating over the mid right atrium. The lungs are clear. No pulmonary edema, pleural effusion, or pneumothorax. Moderate multilevel degenerative disc and anterior endplate osteophytes of the thoracic spine. IMPRESSION: No active cardiopulmonary disease. Electronically Signed   By: Neita Garnet M.D.   On: 04/12/2023 15:40   US Venous Img Lower Unilateral Right  Result Date: 04/12/2023 CLINICAL DATA:  Right lower leg pain EXAM: RIGHT LOWER EXTREMITY VENOUS DOPPLER ULTRASOUND TECHNIQUE: Gray-scale sonography with compression, as well as color and duplex ultrasound, were performed to evaluate the deep venous system(s) from the level of the common femoral vein through the popliteal and proximal calf veins. COMPARISON:  None Available. FINDINGS: VENOUS Normal compressibility of the common femoral, superficial femoral, and popliteal veins, as well as the visualized calf veins. Visualized portions of profunda femoral vein and great saphenous vein unremarkable. No filling defects to suggest DVT on grayscale or color Doppler imaging. Doppler waveforms show normal direction of venous flow, normal respiratory plasticity and response to augmentation. Limited views of the contralateral common femoral vein are unremarkable. OTHER None. Limitations: none IMPRESSION: Negative. Electronically Signed   By: Duanne Guess D.O.    On: 04/12/2023 15:30    Procedures Procedures   Medications Ordered in ED Medications - No data to display  ED Course/ Medical Decision Making/ A&P Clinical Course as of 04/12/23 1809  Tue Apr 12, 2023  1807 WBC(!): 35.3 Notable increase compared to 8 days ago [OZ]    Clinical Course User Index [OZ] Smitty Knudsen, PA-C                           Medical Decision Making Amount and/or Complexity of Data Reviewed Labs: ordered. Radiology: ordered.   This patient presents to the ED for concern of leg pain.  Differential diagnosis includes DVT, cellulitis, knee effusion, contusion   Lab Tests:  I Ordered, and personally interpreted labs.  The pertinent results include:  ***   Imaging Studies ordered:  I ordered imaging studies including x-ray of chest, ultrasound of right lower extremity I independently visualized and interpreted imaging which showed *** I agree with the radiologist interpretation   Medicines ordered and prescription drug management:  I ordered medication including ***  for ***  Reevaluation of the patient after these medicines showed that the patient {resolved/improved/worsened:23923::"improved"} I have reviewed the patients home medicines and have made adjustments as needed   Problem List / ED Course:  ***  Final Clinical Impression(s) / ED Diagnoses Final diagnoses:  None    Rx / DC Orders ED Discharge Orders     None

## 2023-04-12 NOTE — ED Triage Notes (Addendum)
Right leg pain thigh to foot for 3 days.  No known injury.  Notable swelling noted.  Some chest pain, no sob.  No recent travel.  No hormone supplements.  No acute respiratory distress noted.

## 2023-04-12 NOTE — Progress Notes (Signed)
Wendy Hoffman is a 57 y.o. 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 06/18/2021   Hypokalemia 06/18/2021   Malnutrition of moderate degree 06/18/2021   Cancer (HCC) 06/17/2021   Cervical ca (HCC) 06/17/2021   Nausea and vomiting 06/17/2021   Genetic testing 03/23/2021   Port-A-Cath in place 03/16/2021   Goals of care, counseling/discussion 03/06/2021   Morbidly obese (HCC) 02/27/2021   Heme positive stool    Iron deficiency anemia due to chronic blood loss    Epigastric pain    Malignant neoplasm of head of pancreas (HCC) 02/17/2021   Aortic atherosclerosis (HCC) 02/17/2021   GI bleeding 02/16/2021   Class 3 obesity with current BMI at 44.30 kg/m 02/16/2021   COPD (chronic obstructive pulmonary disease) (HCC) 02/16/2021   Symptomatic anemia    Pancreatic cancer (HCC) 02/2021   Snoring 05/03/2016   Morbid obesity (HCC) 04/25/2014   Asthma exacerbation 04/22/2014   Asthma attack 04/22/2014   Asthma with acute exacerbation 11/03/2013   Hypothyroidism 11/03/2013   Tobacco abuse 11/03/2013   Migraine 09/22/2012   Weakness of right side of body 09/22/2012   Thyroid disease    Hypertension    High cholesterol    H/O: hysterectomy    Asthma 11/26/2008   HEADACHE, CHRONIC 11/26/2008    Current Outpatient Medications  Medication Sig Dispense Refill   albuterol (VENTOLIN HFA) 108 (90 Base) MCG/ACT inhaler INHALE 1 TO 2 PUFFS BY MOUTH EVERY 6 HOURS AS NEEDED FOR WHEEZING AND FOR SHORTNESS OF BREATH 18 g 3   bisacodyl (DULCOLAX) 5 MG EC tablet Take 2 tablets (10 mg total) by mouth daily as needed for moderate constipation. 30 tablet 2   dexamethasone (DECADRON) 2 MG tablet Take 5  tabs (10mg ) at 10pm night prior to chemotherapy, 02/20/2023 10 tablet 2   DULoxetine (CYMBALTA) 30 MG capsule TAKE 2 CAPSULES BY MOUTH DAILY 60 capsule 0   fluconazole (DIFLUCAN) 100 MG tablet Take 1 tablet (100 mg total) by mouth daily. X 4 days 4 tablet 0   gabapentin (NEURONTIN) 300 MG capsule TAKE 1 CAPSULE BY MOUTH EVERY NIGHT 30 capsule 2   lidocaine-prilocaine (EMLA) cream Apply 1 tablespoon to port site 2 hours prior to stick to numb site. 30 g 2   magic mouthwash (nystatin, diphenhydrAMINE, alum & mag hydroxide) suspension mixture Swish 5 mL for 1 minute and spit four times daily as needed for mouth pain. 240 mL 1   morphine (MS CONTIN) 60 MG 12 hr tablet Take 1 tablet (60 mg total) by mouth every 12 (twelve) hours. 60 tablet 0   Morphine Sulfate (MORPHINE CONCENTRATE) 10 MG/0.5ML SOLN concentrated solution Place 1-1.5 mLs (20-30 mg total) under the tongue every 8 (eight) hours as needed for severe pain (pain not relieved with oxycodone). 30 mL 0   ondansetron (ZOFRAN) 8 MG tablet Take 1 tablet (8 mg total) by mouth every 8 (eight) hours as needed for nausea or vomiting. 30 tablet 2   ondansetron (ZOFRAN-ODT) 8 MG disintegrating tablet Take 1 tablet (8 mg total) by mouth every 8 (eight) hours as needed for nausea or vomiting. 30 tablet 2   Oxycodone HCl 10 MG TABS Take 1-2 tablets (10-20 mg total) by mouth  every 4 (four) hours as needed (Pain). 60 tablet 0   pantoprazole (PROTONIX) 40 MG tablet TAKE 1 TABLET(40 MG) BY MOUTH TWICE DAILY 60 tablet 2   potassium chloride (KLOR-CON M) 10 MEQ tablet Take 1 tablet (10 mEq total) by mouth 2 (two) times daily. 60 tablet 2   predniSONE (DELTASONE) 10 MG tablet Take 1 tablet (10 mg total) by mouth daily with breakfast. 30 tablet 1   prochlorperazine (COMPAZINE) 10 MG tablet TAKE 1 TABLET(10 MG) BY MOUTH EVERY 6 HOURS AS NEEDED FOR NAUSEA OR VOMITING 60 tablet 2   scopolamine (TRANSDERM-SCOP) 1 MG/3DAYS Place 1 patch (1.5 mg total) onto the skin every 3  (three) days. 10 patch 3   SYMBICORT 160-4.5 MCG/ACT inhaler Inhale 2 puffs into the lungs 2 (two) times daily. 10.2 g 2   zolpidem (AMBIEN) 10 MG tablet Take 1 tablet (10 mg total) by mouth at bedtime as needed for sleep. 30 tablet 2   No current facility-administered medications for this visit.    Allergies: Blueberry [vaccinium angustifolium], Mango flavor, Oxaliplatin, Reglan [metoclopramide], Aspirin, and Penicillins  Past Medical History:  Diagnosis Date   Anemia    Anxiety    ARF (acute renal failure) (HCC) 06/18/2021   Arthritis    Asthma    Cervical ca (HCC)    H/O: hysterectomy    High cholesterol    Hypertension    Hypothyroidism    Insomnia    Medical history non-contributory    Migraine    Morbid obesity (HCC)    Pancreatic cancer (HCC) 02/2021   Shortness of breath    Stroke [facility])    Thyroid disease     Past Surgical History:  Procedure Laterality Date   ABDOMINAL HYSTERECTOMY     BIOPSY  02/19/2021   Procedure: BIOPSY;  Surgeon: Napoleon Form, MD;  Location: [facility];  Service: Endoscopy;;   BIOPSY  06/19/2021   Procedure: BIOPSY;  Surgeon: Kathi Der, MD;  Location: [facility];  Service: Gastroenterology;;   CESAREAN SECTION     3 occasions   CHOLECYSTECTOMY     ESOPHAGOGASTRODUODENOSCOPY (EGD) WITH PROPOFOL N/A 02/19/2021   Procedure: ESOPHAGOGASTRODUODENOSCOPY (EGD) WITH PROPOFOL;  Surgeon: Napoleon Form, MD;  Location: [facility];  Service: Endoscopy;  Laterality: N/A;   ESOPHAGOGASTRODUODENOSCOPY (EGD) WITH PROPOFOL N/A 06/19/2021   Procedure: ESOPHAGOGASTRODUODENOSCOPY (EGD) WITH PROPOFOL;  Surgeon: Kathi Der, MD;  Location: [facility];  Service: Gastroenterology;  Laterality: N/A;   FLEXIBLE SIGMOIDOSCOPY N/A 02/19/2021   Procedure: FLEXIBLE SIGMOIDOSCOPY;  Surgeon: Napoleon Form, MD;  Location: [facility];  Service: Endoscopy;  Laterality: N/A;   IR GASTROSTOMY TUBE MOD SED  07/01/2021   IR GASTROSTOMY TUBE REMOVAL   08/21/2021   IR IMAGING GUIDED PORT INSERTION  02/24/2021   IR US GUIDE BX ASP/DRAIN  02/24/2021   THYROIDECTOMY, PARTIAL     Goiter    Family History  Problem Relation Age of Onset   Migraines Sister    Non-Hodgkin's lymphoma Other        maternal half brother's grandson   Non-Hodgkin's lymphoma Half-Brother        maternal half brother; dx 36s or 47s   Pancreatic cancer Other 52       MGF's niece    Social History   Tobacco Use   Smoking status: Former    Types: Cigarettes   Smokeless tobacco: Never  Substance Use Topics   Alcohol use: No    Subjective:   Patient requesting completion of  paperwork for personal care services/ in home care aide to assist with ADLs; patient does live at home and has recently experienced fall;  Known malignant metastatic pancreatic cancer; treatment complicated by neuropathy;   Mentions 3 day history of marked swelling in right lower leg- noticeable difference between size of 2 legs; feels swelling palpable behind her left knee and up into her thigh;   Objective:  Vitals:   04/12/23 1325  BP: 127/71  Pulse: 98  SpO2: 100%  Weight: 196 lb (88.9 kg)  Height: 5\' 4"  (1.626 m)    General: Well developed, well nourished, in no acute distress  Skin : Warm and dry.  Head: Normocephalic and atraumatic  Lungs: Respirations unlabored;  Musculoskeletal: No deformities; no active joint inflammation  Extremities: swelling noted in right leg- marked difference in size between right and left leg, no cyanosis, clubbing  Vessels: Symmetric bilaterally  Neurologic: Alert and oriented; speech intact; face symmetrical; moves all extremities well; CNII-XII intact without focal deficit   Assessment:  1. Malignant neoplasm of head of pancreas (HCC)   2. Pedal edema     Plan:  Paperwork completed as requested to request home health aide for patient;  Due to appearance of right leg and underlying cancer history, am concerned for potential DVT; unable to  reach her oncologist so opted to send downstairs to [facility] for immediate evaluation; she will follow up with her oncologist as needed for continued management of her needs.   Time spent coordinating care 30 minutes  No follow-ups on file.  No orders of the defined types were placed in this encounter.   Requested Prescriptions    No prescriptions requested or ordered in this encounter

## 2023-04-12 NOTE — ED Notes (Signed)
Port de accessed and flushed with heparin.  Pt signed out AMA. Ambulatory at discharge

## 2023-04-15 ENCOUNTER — Telehealth: Payer: Self-pay | Admitting: *Deleted

## 2023-04-15 DIAGNOSIS — C250 Malignant neoplasm of head of pancreas: Secondary | ICD-10-CM

## 2023-04-15 DIAGNOSIS — C259 Malignant neoplasm of pancreas, unspecified: Secondary | ICD-10-CM

## 2023-04-15 LAB — CULTURE, BLOOD (ROUTINE X 2)

## 2023-04-15 MED ORDER — FLUCONAZOLE 100 MG PO TABS
100.0000 mg | ORAL_TABLET | Freq: Every day | ORAL | 0 refills | Status: DC
Start: 2023-04-15 — End: 2023-04-29

## 2023-04-15 NOTE — Telephone Encounter (Signed)
Called to request refill on fluconazole saying her thrush is back and in back of her throat. OK per Dr. Truett Perna. Wanted MD aware also she was in ER on 4/30 due to swelling in leg. Work-up was negative.

## 2023-04-17 LAB — CULTURE, BLOOD (ROUTINE X 2): Special Requests: ADEQUATE

## 2023-04-18 ENCOUNTER — Telehealth: Payer: Self-pay

## 2023-04-18 ENCOUNTER — Other Ambulatory Visit: Payer: Self-pay | Admitting: Nurse Practitioner

## 2023-04-18 ENCOUNTER — Telehealth: Payer: Self-pay | Admitting: *Deleted

## 2023-04-18 DIAGNOSIS — C250 Malignant neoplasm of head of pancreas: Secondary | ICD-10-CM

## 2023-04-18 LAB — CULTURE, BLOOD (ROUTINE X 2)
Culture: NO GROWTH
Culture: NO GROWTH

## 2023-04-18 MED ORDER — OXYCODONE HCL 10 MG PO TABS
10.0000 mg | ORAL_TABLET | ORAL | 0 refills | Status: DC | PRN
Start: 2023-04-18 — End: 2023-05-10

## 2023-04-18 NOTE — Telephone Encounter (Signed)
Wendy Hoffman has reported that her right leg is still experiencing swelling and pain. The leg is also noticeably colder than usual. She sought evaluation at the Emergency Room with no significant findings. Her Primary Care Physician has scheduled a follow-up appointment for May 7th, 2024. [name] is concerned about the ongoing issue and would like to make Dr. Truett Perna and Misty Stanley aware of her symptoms. I have assured the patient that I will communicate her concerns to the provider. Additionally, [name] is requesting a refill of her pain medication.

## 2023-04-18 NOTE — Telephone Encounter (Signed)
Patient called to request refill on her oxycodone 10 mg IR. Send to [facility] on [address] in [facility].

## 2023-04-19 ENCOUNTER — Encounter: Payer: Self-pay | Admitting: Family

## 2023-04-19 ENCOUNTER — Ambulatory Visit (INDEPENDENT_AMBULATORY_CARE_PROVIDER_SITE_OTHER): Payer: Medicaid Other | Admitting: Family

## 2023-04-19 ENCOUNTER — Ambulatory Visit (HOSPITAL_BASED_OUTPATIENT_CLINIC_OR_DEPARTMENT_OTHER)
Admission: RE | Admit: 2023-04-19 | Discharge: 2023-04-19 | Disposition: A | Discharge status: Home / Self Care | Payer: Medicaid Other | Source: Ambulatory Visit | Attending: Family | Admitting: Family

## 2023-04-19 VITALS — BP 123/87 | HR 95 | Ht 64.0 in | Wt 204.4 lb

## 2023-04-19 DIAGNOSIS — R600 Localized edema: Secondary | ICD-10-CM | POA: Insufficient documentation

## 2023-04-19 MED ORDER — FUROSEMIDE 20 MG PO TABS: 20.0000 mg | ORAL_TABLET | Freq: Two times a day (BID) | ORAL | 0 refills | Status: DC | PRN

## 2023-04-19 NOTE — Progress Notes (Signed)
Wendy Hoffman is a 57 y.o. 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 06/18/2021   Hypokalemia 06/18/2021   Malnutrition of moderate degree 06/18/2021   Cancer (HCC) 06/17/2021   Cervical ca (HCC) 06/17/2021   Nausea and vomiting 06/17/2021   Genetic testing 03/23/2021   Port-A-Cath in place 03/16/2021   Goals of care, counseling/discussion 03/06/2021   Morbidly obese (HCC) 02/27/2021   Heme positive stool    Iron deficiency anemia due to chronic blood loss    Epigastric pain    Malignant neoplasm of head of pancreas (HCC) 02/17/2021   Aortic atherosclerosis (HCC) 02/17/2021   GI bleeding 02/16/2021   Class 3 obesity with current BMI at 44.30 kg/m 02/16/2021   COPD (chronic obstructive pulmonary disease) (HCC) 02/16/2021   Symptomatic anemia    Pancreatic cancer (HCC) 02/2021   Snoring 05/03/2016   Morbid obesity (HCC) 04/25/2014   Asthma exacerbation 04/22/2014   Asthma attack 04/22/2014   Asthma with acute exacerbation 11/03/2013   Hypothyroidism 11/03/2013   Tobacco abuse 11/03/2013   Migraine 09/22/2012   Weakness of right side of body 09/22/2012   Thyroid disease    Hypertension    High cholesterol    H/O: hysterectomy    Asthma 11/26/2008   HEADACHE, CHRONIC 11/26/2008    Current Outpatient Medications  Medication Sig Dispense Refill   albuterol (VENTOLIN HFA) 108 (90 Base) MCG/ACT inhaler INHALE 1 TO 2 PUFFS BY MOUTH EVERY 6 HOURS AS NEEDED FOR WHEEZING AND FOR SHORTNESS OF BREATH 18 g 3   bisacodyl (DULCOLAX) 5 MG EC tablet Take 2 tablets (10 mg total) by mouth daily as needed for moderate constipation. 30 tablet 2   dexamethasone (DECADRON) 2 MG tablet Take 5  tabs (10mg ) at 10pm night prior to chemotherapy, 02/20/2023 10 tablet 2   DULoxetine (CYMBALTA) 30 MG capsule TAKE 2 CAPSULES BY MOUTH DAILY 60 capsule 0   fluconazole (DIFLUCAN) 100 MG tablet Take 1 tablet (100 mg total) by mouth daily. X 4 days 4 tablet 0   furosemide (LASIX) 20 MG tablet Take 1 tablet (20 mg total) by mouth 2 (two) times daily as needed for edema. 30 tablet 0   gabapentin (NEURONTIN) 300 MG capsule TAKE 1 CAPSULE BY MOUTH EVERY NIGHT 30 capsule 2   lidocaine-prilocaine (EMLA) cream Apply 1 tablespoon to port site 2 hours prior to stick to numb site. 30 g 2   magic mouthwash (nystatin, diphenhydrAMINE, alum & mag hydroxide) suspension mixture Swish 5 mL for 1 minute and spit four times daily as needed for mouth pain. 240 mL 1   morphine (MS CONTIN) 60 MG 12 hr tablet Take 1 tablet (60 mg total) by mouth every 12 (twelve) hours. 60 tablet 0   Morphine Sulfate (MORPHINE CONCENTRATE) 10 MG/0.5ML SOLN concentrated solution Place 1-1.5 mLs (20-30 mg total) under the tongue every 8 (eight) hours as needed for severe pain (pain not relieved with oxycodone). 30 mL 0   ondansetron (ZOFRAN) 8 MG tablet Take 1 tablet (8 mg total) by mouth every 8 (eight) hours as needed for nausea or vomiting. 30 tablet 2   ondansetron (ZOFRAN-ODT) 8 MG disintegrating tablet Take 1 tablet (8 mg total) by mouth every 8 (  eight) hours as needed for nausea or vomiting. 30 tablet 2   Oxycodone HCl 10 MG TABS Take 1-2 tablets (10-20 mg total) by mouth every 4 (four) hours as needed (Pain). 60 tablet 0   pantoprazole (PROTONIX) 40 MG tablet TAKE 1 TABLET(40 MG) BY MOUTH TWICE DAILY 60 tablet 2   potassium chloride (KLOR-CON M) 10 MEQ tablet Take 1 tablet (10 mEq total) by mouth 2 (two) times daily. 60 tablet 2   predniSONE (DELTASONE) 10 MG tablet Take 1 tablet (10 mg total) by mouth daily with breakfast. 30 tablet 1   prochlorperazine (COMPAZINE) 10 MG tablet TAKE 1 TABLET(10 MG) BY MOUTH EVERY 6 HOURS AS NEEDED FOR  NAUSEA OR VOMITING 60 tablet 2   scopolamine (TRANSDERM-SCOP) 1 MG/3DAYS Place 1 patch (1.5 mg total) onto the skin every 3 (three) days. 10 patch 3   SYMBICORT 160-4.5 MCG/ACT inhaler Inhale 2 puffs into the lungs 2 (two) times daily. 10.2 g 2   zolpidem (AMBIEN) 10 MG tablet Take 1 tablet (10 mg total) by mouth at bedtime as needed for sleep. 30 tablet 2   No current facility-administered medications for this visit.    Allergies: Blueberry [vaccinium angustifolium], Mango flavor, Oxaliplatin, Reglan [metoclopramide], Aspirin, and Penicillins  Past Medical History:  Diagnosis Date   Anemia    Anxiety    ARF (acute renal failure) (HCC) 06/18/2021   Arthritis    Asthma    Cervical ca (HCC)    H/O: hysterectomy    High cholesterol    Hypertension    Hypothyroidism    Insomnia    Medical history non-contributory    Migraine    Morbid obesity (HCC)    Pancreatic cancer (HCC) 02/2021   Shortness of breath    Stroke [facility])    Thyroid disease     Past Surgical History:  Procedure Laterality Date   ABDOMINAL HYSTERECTOMY     BIOPSY  02/19/2021   Procedure: BIOPSY;  Surgeon: Napoleon Form, MD;  Location: [facility];  Service: Endoscopy;;   BIOPSY  06/19/2021   Procedure: BIOPSY;  Surgeon: Kathi Der, MD;  Location: [facility];  Service: Gastroenterology;;   CESAREAN SECTION     3 occasions   CHOLECYSTECTOMY     ESOPHAGOGASTRODUODENOSCOPY (EGD) WITH PROPOFOL N/A 02/19/2021   Procedure: ESOPHAGOGASTRODUODENOSCOPY (EGD) WITH PROPOFOL;  Surgeon: Napoleon Form, MD;  Location: [facility];  Service: Endoscopy;  Laterality: N/A;   ESOPHAGOGASTRODUODENOSCOPY (EGD) WITH PROPOFOL N/A 06/19/2021   Procedure: ESOPHAGOGASTRODUODENOSCOPY (EGD) WITH PROPOFOL;  Surgeon: Kathi Der, MD;  Location: [facility];  Service: Gastroenterology;  Laterality: N/A;   FLEXIBLE SIGMOIDOSCOPY N/A 02/19/2021   Procedure: FLEXIBLE SIGMOIDOSCOPY;  Surgeon: Napoleon Form, MD;  Location:  [facility];  Service: Endoscopy;  Laterality: N/A;   IR GASTROSTOMY TUBE MOD SED  07/01/2021   IR GASTROSTOMY TUBE REMOVAL  08/21/2021   IR IMAGING GUIDED PORT INSERTION  02/24/2021   IR US GUIDE BX ASP/DRAIN  02/24/2021   THYROIDECTOMY, PARTIAL     Goiter    Family History  Problem Relation Age of Onset   Migraines Sister    Non-Hodgkin's lymphoma Other        maternal half brother's grandson   Non-Hodgkin's lymphoma Half-Brother        maternal half brother; dx 60s or 67s   Pancreatic cancer Other 73       MGF's niece    Social History   Tobacco Use   Smoking status: Former  Types: Cigarettes   Smokeless tobacco: Never  Substance Use Topics   Alcohol use: No    Subjective:   Seen last week with right leg swelling- was sent to ER- testing for DVT negative and abd/ pelvic CT did not show any acute changes; concerned about worsening swelling in the right leg; no chest pain or shortness of breath; no prior history of CHF;   Objective:  Vitals:   04/19/23 1535  BP: 123/87  Pulse: 95  SpO2: 100%  Weight: 204 lb 6.4 oz (92.7 kg)  Height: 5\' 4"  (1.626 m)    General: Well developed, well nourished, in no acute distress  Skin : Warm and dry.  Head: Normocephalic and atraumatic  Eyes: Sclera and conjunctiva clear; pupils round and reactive to light; extraocular movements intact  Ears: External normal; canals clear; tympanic membranes normal  Oropharynx: Pink, supple. No suspicious lesions  Neck: Supple without thyromegaly, adenopathy  Lungs: Respirations unlabored; clear to auscultation bilaterally without wheeze, rales, rhonchi  CVS exam: normal rate and regular rhythm.  Extremities: unilateral pedal edema, cyanosis, clubbing  Vessels: Symmetric bilaterally  Neurologic: Alert and oriented; speech intact; face symmetrical; moves all extremities well; CNII-XII intact without focal deficit   Assessment:  1. Pedal edema     Plan:  Concern for possible reaction to new  chemotherapy which she has started recently; will update labs and repeat CXR today; trial of Lasix 20 mg bid prn for edema/ swelling; she will need to keep planned follow up with her oncologist; follow up to be determined.   No follow-ups on file.  Orders Placed This Encounter  Procedures   DG Chest 2 View    Standing Status:   Future    Number of Occurrences:   1    Standing Expiration Date:   04/18/2024    Order Specific Question:   Reason for Exam (SYMPTOM  OR DIAGNOSIS REQUIRED)    Answer:   pedal edema    Order Specific Question:   Is patient pregnant?    Answer:   No    Order Specific Question:   Preferred imaging location?    Answer:   [facility]   CBC with Differential/Platelet   Comp Met (CMET)   B Nat Peptide    Requested Prescriptions   Signed Prescriptions Disp Refills   furosemide (LASIX) 20 MG tablet 30 tablet 0    Sig: Take 1 tablet (20 mg total) by mouth 2 (two) times daily as needed for edema.

## 2023-04-20 ENCOUNTER — Encounter: Payer: Self-pay | Admitting: Oncology

## 2023-04-20 LAB — CBC WITH DIFFERENTIAL/PLATELET
Basophils Absolute: 0 10*3/uL (ref 0.0–0.1)
Basophils Relative: 0.6 % (ref 0.0–3.0)
Eosinophils Absolute: 0.3 10*3/uL (ref 0.0–0.7)
Eosinophils Relative: 3.4 % (ref 0.0–5.0)
HCT: 28.3 % — ABNORMAL LOW (ref 36.0–46.0)
Hemoglobin: 9.4 g/dL — ABNORMAL LOW (ref 12.0–15.0)
Lymphocytes Relative: 15.7 % (ref 12.0–46.0)
Lymphs Abs: 1.2 10*3/uL (ref 0.7–4.0)
MCHC: 33.1 g/dL (ref 30.0–36.0)
MCV: 99.6 fl (ref 78.0–100.0)
Monocytes Absolute: 0.4 10*3/uL (ref 0.1–1.0)
Monocytes Relative: 4.8 % (ref 3.0–12.0)
Neutro Abs: 5.8 10*3/uL (ref 1.4–7.7)
Neutrophils Relative %: 75.5 % (ref 43.0–77.0)
Platelets: 200 10*3/uL (ref 150.0–400.0)
RBC: 2.84 Mil/uL — ABNORMAL LOW (ref 3.87–5.11)
RDW: 21.3 % — ABNORMAL HIGH (ref 11.5–15.5)
WBC: 7.6 10*3/uL (ref 4.0–10.5)

## 2023-04-20 LAB — COMPREHENSIVE METABOLIC PANEL
ALT: 12 U/L (ref 0–35)
AST: 13 U/L (ref 0–37)
Albumin: 3.4 g/dL — ABNORMAL LOW (ref 3.5–5.2)
Alkaline Phosphatase: 125 U/L — ABNORMAL HIGH (ref 39–117)
BUN: 18 mg/dL (ref 6–23)
CO2: 30 mEq/L (ref 19–32)
Calcium: 8.5 mg/dL (ref 8.4–10.5)
Chloride: 106 mEq/L (ref 96–112)
Creatinine, Ser: 0.97 mg/dL (ref 0.40–1.20)
GFR: 65.96 mL/min (ref >60.00)
Glucose, Bld: 97 mg/dL (ref 70–99)
Potassium: 4.4 mEq/L (ref 3.5–5.1)
Sodium: 142 mEq/L (ref 135–145)
Total Bilirubin: 0.3 mg/dL (ref 0.2–1.2)
Total Protein: 5.4 g/dL — ABNORMAL LOW (ref 6.0–8.3)

## 2023-04-20 LAB — BRAIN NATRIURETIC PEPTIDE: Pro B Natriuretic peptide (BNP): 108 pg/mL — ABNORMAL HIGH (ref 0.0–100.0)

## 2023-04-21 ENCOUNTER — Other Ambulatory Visit: Payer: Self-pay

## 2023-04-21 ENCOUNTER — Encounter: Payer: Self-pay | Admitting: Nurse Practitioner

## 2023-04-21 ENCOUNTER — Inpatient Hospital Stay: Payer: Medicaid Other | Attending: Nurse Practitioner | Admitting: Nurse Practitioner

## 2023-04-21 ENCOUNTER — Ambulatory Visit (HOSPITAL_BASED_OUTPATIENT_CLINIC_OR_DEPARTMENT_OTHER): Admission: RE | Admit: 2023-04-21 | Payer: Medicaid Other | Source: Ambulatory Visit

## 2023-04-21 VITALS — BP 121/82 | HR 94 | Temp 98.1°F | Resp 18 | Ht 64.0 in | Wt 199.8 lb

## 2023-04-21 DIAGNOSIS — E876 Hypokalemia: Secondary | ICD-10-CM | POA: Insufficient documentation

## 2023-04-21 DIAGNOSIS — C250 Malignant neoplasm of head of pancreas: Secondary | ICD-10-CM

## 2023-04-21 DIAGNOSIS — Z5111 Encounter for antineoplastic chemotherapy: Secondary | ICD-10-CM | POA: Insufficient documentation

## 2023-04-21 DIAGNOSIS — Z5189 Encounter for other specified aftercare: Secondary | ICD-10-CM | POA: Insufficient documentation

## 2023-04-21 DIAGNOSIS — M7989 Other specified soft tissue disorders: Secondary | ICD-10-CM | POA: Diagnosis not present

## 2023-04-21 DIAGNOSIS — E785 Hyperlipidemia, unspecified: Secondary | ICD-10-CM | POA: Diagnosis not present

## 2023-04-21 DIAGNOSIS — E46 Unspecified protein-calorie malnutrition: Secondary | ICD-10-CM | POA: Diagnosis not present

## 2023-04-21 DIAGNOSIS — C787 Secondary malignant neoplasm of liver and intrahepatic bile duct: Secondary | ICD-10-CM | POA: Insufficient documentation

## 2023-04-21 DIAGNOSIS — Z8673 Personal history of transient ischemic attack (TIA), and cerebral infarction without residual deficits: Secondary | ICD-10-CM | POA: Diagnosis not present

## 2023-04-21 DIAGNOSIS — J45909 Unspecified asthma, uncomplicated: Secondary | ICD-10-CM | POA: Insufficient documentation

## 2023-04-21 DIAGNOSIS — I10 Essential (primary) hypertension: Secondary | ICD-10-CM | POA: Diagnosis not present

## 2023-04-21 DIAGNOSIS — G43909 Migraine, unspecified, not intractable, without status migrainosus: Secondary | ICD-10-CM | POA: Insufficient documentation

## 2023-04-21 DIAGNOSIS — E860 Dehydration: Secondary | ICD-10-CM | POA: Insufficient documentation

## 2023-04-21 DIAGNOSIS — E039 Hypothyroidism, unspecified: Secondary | ICD-10-CM | POA: Diagnosis not present

## 2023-04-21 DIAGNOSIS — D509 Iron deficiency anemia, unspecified: Secondary | ICD-10-CM | POA: Diagnosis not present

## 2023-04-21 DIAGNOSIS — B370 Candidal stomatitis: Secondary | ICD-10-CM | POA: Diagnosis not present

## 2023-04-21 NOTE — Progress Notes (Signed)
[facility] OFFICE PROGRESS NOTE   Diagnosis: Pancreas cancer  INTERVAL HISTORY:   Wendy Hoffman returns for follow-up.  She completed another cycle of FOLFOXIRI 04/05/2023.  Approximately 1 hour into the oxaliplatin infusion she developed shortness of breath and severe pruritus.  The infusion was stopped.  She was given Benadryl 25 mg IV.  Symptoms resolved.  Oxaliplatin was not resumed.  She completed the remainder of the treatment.  She continues to have abdominal pain.  The pain is controlled with her home medications.  She has occasional nausea.  No diarrhea.  No rash.  Consistent numbness/tingling in the fingertips and toes.  She reports swelling of the entire right leg for the past week, painful at the ankle and foot.  Objective:  Vital signs in last 24 hours:  Blood pressure 121/82, pulse 94, temperature 98.1 F (36.7 C), temperature source Oral, resp. rate 18, height 5\' 4"  (1.626 m), weight 199 lb 12.8 oz (90.6 kg), SpO2 100 %.    HEENT: No thrush. Resp: Lungs clear bilaterally. Cardio: Regular rate and rhythm. GI: Firm fullness right upper abdomen, associated tenderness. Vascular: Edema right lower leg.  Tender throughout the right leg. Neuro: Alert and oriented. Port-A-Cath without erythema.  Lab Results:  Lab Results  Component Value Date   WBC 7.6 04/19/2023   HGB 9.4 (L) 04/19/2023   HCT 28.3 (L) 04/19/2023   MCV 99.6 04/19/2023   PLT 200.0 04/19/2023   NEUTROABS 5.8 04/19/2023    Imaging:  DG Chest 2 View  Result Date: 04/19/2023 CLINICAL DATA:  Pedal edema.  History of pancreatic cancer. EXAM: CHEST - 2 VIEW COMPARISON:  X-ray 04/12/2023 FINDINGS: Underinflation. Normal cardiopericardial silhouette. No consolidation, pneumothorax or effusion. No edema. Right IJ chest port with tip at the SVC right atrial junction. Degenerative changes along the spine with bridging osteophytes. IMPRESSION: Underinflation.  No acute cardiopulmonary disease.   Chest port Electronically Signed   By: Karen Kays M.D.   On: 04/19/2023 17:05    Medications: I have reviewed the patient's current medications.  Assessment/Plan: Pancreas cancer -CT abdomen/pelvis without contrast 02/16/2021-multiple large hypoechoic masses in the patient's liver consistent metastatic disease, ill-defined masslike area in the pancreatic head measuring approximate 4.7 cm, possible filling defect in the right ventricle. -EGD 02/19/2021-large infiltrative mass with bleeding in the second portion of the duodenum, appears to be arising near the ampulla, partially obstructive with friable mucosa.  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 -Flexible sigmoidoscopy 02/19/2021-diverticulosis in the sigmoid colon, nonbleeding external and internal hemorrhoids -Cardiac CT 02/20/2021-mass in the mid RV attached to the interventricular septum measuring 16 mm x 6 mm and concerning for metastatic tumor -MRI abdomen with/without contrast 02/21/2021-mass in the posterior pancreatic head measuring 4.3 x 3.8 cm with obstruction of the central pancreatic duct and diffuse dilatation up to 6 mm consistent with pancreatic adenocarcinoma, liver lesions the largest mass of the central left lobe measuring 9.9 x 9.2 cm consistent with hepatic metastatic disease,  hepatomegaly. -Ultrasound-guided biopsy of a left liver lesion 02/24/2021-adenocarcinoma, morphologically similar to the duodenal biopsy; microsatellite stable, tumor mutation burden 1 -Negative genetic testing -Palliative radiation to the duodenal mass 02/25/2021-03/06/2021 -Cycle 1 FOLFOX 03/17/2021 -Cycle 2 FOLFOX 04/01/2021 -Cycle 3 FOLFIRINOX 04/14/2021 -Cycle 4 FOLFIRINOX 04/27/2021, Udenyca added -Cycle 5 FOLFIRINOX 05/12/2021, Udenyca -MRI abdomen 05/23/2021-decrease in size of pancreas mass and hepatic lesions, no progressive disease -Cycle 6 FOLFIRINOX 05/27/2021 -CT 06/17/2021-multiple liver metastases similar to her prior  exam.  Pancreas mass not significantly changed. -  CT 06/26/2021-stable pancreas mass, no significant change in size and number of known liver metastases. -CT abdomen/pelvis 07/12/2021-new gastrostomy tube.  Multiple liver masses slightly decreased in size from prior exam.  Pancreas head mass unchanged. -Cycle 1 gemcitabine/Abraxane 07/30/2021 -Cycle 2 gemcitabine/Abraxane 08/13/2021 -Cycle 3 gemcitabine 08/27/2021, Abraxane held due to significant bilateral toe pain, question neuropathy -Cycle 4 gemcitabine, Abraxane held due to neuropathy, 09/10/2021 -Cycle 5 gemcitabine, Abraxane held due to neuropathy, 09/24/2021 -Cycle 6 gemcitabine, Abraxane held due to neuropathy 10/16/2021 -CTs 10/29/2021-mild progression of multifocal liver metastases; mild increase in size of hypoenhancing mass within the head of the pancreas; new mild pleural nodularity along the posterior lower lobes -Cycle 7 gemcitabine/Abraxane 11/03/2021 -Cycle 8 gemcitabine/Abraxane 11/23/2021 -Cycle 9 gemcitabine/Abraxane 12/10/2021 -Cycle 10 Gemcitabine/Abraxane 12/24/2021 -CT abdomen/pelvis 01/04/2022-decrease size of pancreas head mass, no change in multifocal hepatic metastases -Cycle 11 gemcitabine/Abraxane 01/07/2022 -Cycle 12 gemcitabine/Abraxane 02/04/2022 -Cycle 13 Gemcitabine/Abraxane 02/18/2022 -Cycle 14 gemcitabine/Abraxane 03/11/2022 -Cycle 15 gemcitabine/Abraxane 03/29/2022 -Cycle 16 gemcitabine/Abraxane 04/16/2022 -CT abdomen/pelvis 04/22/2022-mixed response in the liver, some lesions larger, some smaller, no change in pancreas head mass, no evidence of metastatic disease elsewhere in the abdomen or pelvis -Cycle 17 gemcitabine/Abraxane 05/17/2022 -Cycle 18 Gemcitabine/Abraxane 06/01/2022 -Cycle 19 gemcitabine/Abraxane 06/18/2022 -Cycle 20 gemcitabine/Abraxane 07/05/2022 -Cycle 21 gemcitabine/Abraxane 08/09/2022 -Cycle 22 gemcitabine/Abraxane 08/23/2022 -CT abdomen/pelvis 09/01/2022-stable liver metastases, difficult to visualize the  pancreas head mass, no evidence of disease progression -Cycle 23 gemcitabine/Abraxane 09/06/2022 -Cycle 24 gemcitabine/Abraxane 09/20/2022 -Cycle 25 gemcitabine/Abraxane 10/18/2022 -Cycle 26 gemcitabine/Abraxane 11/01/2022 -Cycle 27 gemcitabine/Abraxane 11/29/2022 -CT abdomen/pelvis 12/14/2022-increased size of liver metastases, less well-defined pancreas mass, 4 mm right lung nodule -Cycle 1 FOLFIRINOX 01/10/2023 -Cycle 2 FOLFIRINOX 01/24/2023, 5-FU dose reduced secondary to mucositis, chemotherapy plan dose reduce for weight loss -Develop pruritus during the oxaliplatin infusion, initial improvement with Benadryl and Solu-Medrol, oxaliplatin was resumed and the pruritus recurred.  Oxaliplatin was discontinued. -Chemotherapy held 02/07/2023 due to diarrhea, nausea/vomiting, dehydration -Cycle 3 FOLFIRINOX 03/07/2023 -Cycle 4 FOLFIRINOX 03/22/2023, 5-FU dose reduction due to mucositis, chemotherapy plan dose reduction for weight loss -CTs 03/30/2021-numerous bulky liver lesions, majority are slightly diminished in size though some lesions are unchanged or slightly enlarged. -Cycle 5 FOLFIRINOX 04/05/2023 -04/05/2023 reaction to oxaliplatin with dyspnea and pruritus -FOLFIRI every 3 weeks beginning 04/27/2023 2.  Iron deficiency anemia-improved -Feraheme 510 mg 02/20/2021 3.  Protein calorie malnutrition 4.  Possible right ventricular filling defect on noncontrast CT scan MRI cardiac morphology 02/21/2021-mass in the mid RV attached to the interventricular septum measures 16 mm x 6 mm.  Mass appears heterogeneous with area of enhancement on LGE imaging.  Mass is concerning for metastatic tumor.  Normal LV size and systolic function.  Normal RV size and systolic function. 5.  Hypertension 6.  History of CVA 7.  Hyperlipidemia 8.  Hypothyroidism 9.  Morbid obesity 10.  Asthma 11.  Migraines 12.  Pain secondary to #1 13.  Port-A-Cath placement 02/25/2021 14.  Hypokalemia 04/01/2021-started a potassium  supplement 15.  Hospital admission 06/17/2021-intractable nausea and vomiting EGD 06/19/2021-diffuse gastritis, nonobstructing duodenal mass, biopsy of stomach-no malignancy or H. pylori, duodenal mass-adenocarcinoma 16.  Hospital admission 07/12/2021 with nausea/vomiting, abdominal pain, and hypokalemia 17.  Percutaneous gastrostomy tube placement 07/01/2021 18.  07/21/2022 bilateral lower extremity venous Doppler study-negative for DVT.  Disposition: Ms. Harkey appears stable.  She completed cycle 5 FOLFIRINOX 04/05/2023.  During the oxaliplatin infusion she developed dyspnea and pruritus despite multiple premedications.  She understands the recommendation to discontinue further oxaliplatin.  Dr. Truett Perna discussed changing treatment to FOLFIRI versus  cisplatin/gemcitabine.  Side effects associated with gemcitabine and cisplatin reviewed.  She prefers to continue treatment with FOLFIRI on a 3-week regimen.  She is scheduled for the next cycle of chemotherapy 04/27/2023.  She has persistent swelling and pain involving the right leg.  Negative venous Doppler about a week ago.  She understands she is at high risk for DVT.  We are referring her for a another venous Doppler study.  We will see her in follow-up 05/17/2022.  She will contact the office in the interim with any problems.  Patient seen with Dr. Dominga Ferry ANP/GNP-BC   04/21/2023  2:40 PM   This was a shared visit with Lonna Cobb.  Ms. Richardson Dopp was interviewed and examined.  She had another allergic reaction with the last cycle of oxaliplatin.  We do not plan to administer additional oxaliplatin.  She has experienced improvement since resuming oxaliplatin based therapy.  We discussed a change to gemcitabine/cisplatin.  We also discussed continuing with FOLFIRI.  She prefers FOLFIRI.  She will be referred for a repeat Doppler of the right leg  I was present for greater than 50% of today's visit.  I performed medical decision  making.  Mancel Bale, MD

## 2023-04-22 ENCOUNTER — Ambulatory Visit (HOSPITAL_BASED_OUTPATIENT_CLINIC_OR_DEPARTMENT_OTHER)
Admission: RE | Admit: 2023-04-22 | Discharge: 2023-04-22 | Disposition: A | Discharge status: Home / Self Care | Payer: Medicaid Other | Source: Ambulatory Visit | Attending: Nurse Practitioner | Admitting: Nurse Practitioner

## 2023-04-22 ENCOUNTER — Other Ambulatory Visit (HOSPITAL_BASED_OUTPATIENT_CLINIC_OR_DEPARTMENT_OTHER): Payer: Self-pay | Admitting: Cardiology

## 2023-04-22 ENCOUNTER — Other Ambulatory Visit: Payer: Self-pay | Admitting: Nurse Practitioner

## 2023-04-22 ENCOUNTER — Other Ambulatory Visit (HOSPITAL_BASED_OUTPATIENT_CLINIC_OR_DEPARTMENT_OTHER): Payer: Self-pay

## 2023-04-22 DIAGNOSIS — M7989 Other specified soft tissue disorders: Secondary | ICD-10-CM

## 2023-04-22 DIAGNOSIS — C250 Malignant neoplasm of head of pancreas: Secondary | ICD-10-CM

## 2023-04-26 ENCOUNTER — Other Ambulatory Visit: Payer: Medicaid Other

## 2023-04-26 ENCOUNTER — Inpatient Hospital Stay: Payer: Medicaid Other

## 2023-04-26 ENCOUNTER — Ambulatory Visit: Payer: Medicaid Other

## 2023-04-26 ENCOUNTER — Inpatient Hospital Stay: Payer: Medicaid Other | Admitting: Nurse Practitioner

## 2023-04-27 ENCOUNTER — Inpatient Hospital Stay: Payer: Medicaid Other | Admitting: Nutrition

## 2023-04-27 ENCOUNTER — Encounter: Payer: Self-pay | Admitting: Oncology

## 2023-04-27 ENCOUNTER — Inpatient Hospital Stay: Payer: Medicaid Other

## 2023-04-27 ENCOUNTER — Other Ambulatory Visit (HOSPITAL_BASED_OUTPATIENT_CLINIC_OR_DEPARTMENT_OTHER): Payer: Self-pay

## 2023-04-27 VITALS — BP 144/87 | HR 87 | Temp 98.2°F | Resp 20 | Ht 64.0 in | Wt 200.0 lb

## 2023-04-27 DIAGNOSIS — C259 Malignant neoplasm of pancreas, unspecified: Secondary | ICD-10-CM

## 2023-04-27 DIAGNOSIS — Z5111 Encounter for antineoplastic chemotherapy: Secondary | ICD-10-CM | POA: Diagnosis not present

## 2023-04-27 DIAGNOSIS — C250 Malignant neoplasm of head of pancreas: Secondary | ICD-10-CM

## 2023-04-27 LAB — CBC WITH DIFFERENTIAL (CANCER CENTER ONLY)
Abs Immature Granulocytes: 0.03 10*3/uL (ref 0.00–0.07)
Basophils Absolute: 0 10*3/uL (ref 0.0–0.1)
Basophils Relative: 0 %
Eosinophils Absolute: 0.2 10*3/uL (ref 0.0–0.5)
Eosinophils Relative: 5 %
HCT: 28.9 % — ABNORMAL LOW (ref 36.0–46.0)
Hemoglobin: 9.2 g/dL — ABNORMAL LOW (ref 12.0–15.0)
Immature Granulocytes: 1 %
Lymphocytes Relative: 28 %
Lymphs Abs: 1.1 10*3/uL (ref 0.7–4.0)
MCH: 32.2 pg (ref 26.0–34.0)
MCHC: 31.8 g/dL (ref 30.0–36.0)
MCV: 101 fL — ABNORMAL HIGH (ref 80.0–100.0)
Monocytes Absolute: 0.5 10*3/uL (ref 0.1–1.0)
Monocytes Relative: 12 %
Neutro Abs: 2.1 10*3/uL (ref 1.7–7.7)
Neutrophils Relative %: 54 %
Platelet Count: 186 10*3/uL (ref 150–400)
RBC: 2.86 MIL/uL — ABNORMAL LOW (ref 3.87–5.11)
RDW: 18.6 % — ABNORMAL HIGH (ref 11.5–15.5)
WBC Count: 3.8 10*3/uL — ABNORMAL LOW (ref 4.0–10.5)
nRBC: 0 % (ref 0.0–0.2)

## 2023-04-27 LAB — CMP (CANCER CENTER ONLY)
ALT: 9 U/L (ref 0–44)
AST: 14 U/L — ABNORMAL LOW (ref 15–41)
Albumin: 3.5 g/dL (ref 3.5–5.0)
Alkaline Phosphatase: 99 U/L (ref 38–126)
Anion gap: 6 (ref 5–15)
BUN: 14 mg/dL (ref 6–20)
CO2: 32 mmol/L (ref 22–32)
Calcium: 8.9 mg/dL (ref 8.9–10.3)
Chloride: 105 mmol/L (ref 98–111)
Creatinine: 0.95 mg/dL (ref 0.44–1.00)
GFR, Estimated: >60 mL/min
Glucose, Bld: 95 mg/dL (ref 70–99)
Potassium: 3.5 mmol/L (ref 3.5–5.1)
Sodium: 143 mmol/L (ref 135–145)
Total Bilirubin: 0.3 mg/dL (ref 0.3–1.2)
Total Protein: 5.5 g/dL — ABNORMAL LOW (ref 6.5–8.1)

## 2023-04-27 MED ORDER — PALONOSETRON HCL INJECTION 0.25 MG/5ML
0.2500 mg | Freq: Once | INTRAVENOUS | Status: AC
  Administered 2023-04-27: 0.25 mg via INTRAVENOUS
  Filled 2023-04-27: qty 5

## 2023-04-27 MED ORDER — SODIUM CHLORIDE 0.9 % IV SOLN
1000.0000 mg/m2 | INTRAVENOUS | Status: DC
  Administered 2023-04-27: 2000 mg via INTRAVENOUS
  Filled 2023-04-27: qty 40

## 2023-04-27 MED ORDER — SODIUM CHLORIDE 0.9 % IV SOLN
100.0000 mg/m2 | Freq: Once | INTRAVENOUS | Status: AC
  Administered 2023-04-27: 200 mg via INTRAVENOUS
  Filled 2023-04-27: qty 10

## 2023-04-27 MED ORDER — SODIUM CHLORIDE 0.9 % IV SOLN
150.0000 mg | Freq: Once | INTRAVENOUS | Status: AC
  Administered 2023-04-27: 150 mg via INTRAVENOUS
  Filled 2023-04-27: qty 150

## 2023-04-27 MED ORDER — SODIUM CHLORIDE 0.9 % IV SOLN
200.0000 mg/m2 | Freq: Once | INTRAVENOUS | Status: AC
  Administered 2023-04-27: 376 mg via INTRAVENOUS
  Filled 2023-04-27: qty 18.8

## 2023-04-27 MED ORDER — SODIUM CHLORIDE 0.9 % IV SOLN
10.0000 mg | Freq: Once | INTRAVENOUS | Status: AC
  Administered 2023-04-27: 10 mg via INTRAVENOUS
  Filled 2023-04-27: qty 10

## 2023-04-27 MED ORDER — SODIUM CHLORIDE 0.9 % IV SOLN: INTRAVENOUS | Status: DC

## 2023-04-27 MED ORDER — ATROPINE SULFATE 1 MG/ML IV SOLN
0.5000 mg | Freq: Once | INTRAVENOUS | Status: AC | PRN
  Administered 2023-04-27: 0.5 mg via INTRAVENOUS
  Filled 2023-04-27: qty 1

## 2023-04-27 NOTE — Progress Notes (Signed)
Nutrition follow up completed with patient in infusion room. Receives FOLFOXIRI for Pancreas cancer and is followed by Dr. Truett Perna.  Weight documented as 200 pounds today. Increased from 183 pounds on March 25.  Reviewed labs.  Patient states she is able to eat now. She had an omelet today for breakfast. She reports thrush occurs after she has treatment and that makes it painful to swallow. She gets medication but it still affects oral intake. She does not use baking soda and salt water gargles. She is requesting Ensure. States she cannot afford it. She is on food stamps. Typically tries to drink 2 Ensure daily. Reports nausea has improved. She is moving her bowels.  Nutrition Diagnosis: Inadequate oral intake improving.  Intervention: Educated on strategies for oral care including baking soda and salt water gargles. Encouraged soft foods. Provided fact sheet. Continue Ensure as needed for weight maintenance. Provided one complimentary case of Ensure Plus HP and a bag from the food pantry.  Monitoring, Evaluation, Goals: Patient will tolerate adequate calories and protein to minimize wt loss  Next Visit: To be scheduled as needed with treatments.

## 2023-04-27 NOTE — Patient Instructions (Signed)
Wendy Hoffman   Discharge Instructions: Thank you for choosing [facility] to provide your oncology and hematology care.   If you have a lab appointment with the [facility], please go directly to the [facility] and check in at the registration area.   Wear comfortable clothing and clothing appropriate for easy access to any Portacath or PICC line.   We strive to give you quality time with your provider. You may need to reschedule your appointment if you arrive late (15 or more minutes).  Arriving late affects you and other patients whose appointments are after yours.  Also, if you miss three or more appointments without notifying the office, you may be dismissed from the clinic at the provider's discretion.      For prescription refill requests, have your pharmacy contact our office and allow 72 hours for refills to be completed.    Today you received the following chemotherapy and/or immunotherapy agents Irinotecan (CAMPTOSAR), Leucovorin & Flourouracil (ADRUCIL).      To help prevent nausea and vomiting after your treatment, we encourage you to take your nausea medication as directed.  BELOW ARE SYMPTOMS THAT SHOULD BE REPORTED IMMEDIATELY: *FEVER GREATER THAN 100.4 F (38 C) OR HIGHER *CHILLS OR SWEATING *NAUSEA AND VOMITING THAT IS NOT CONTROLLED WITH YOUR NAUSEA MEDICATION *UNUSUAL SHORTNESS OF BREATH *UNUSUAL BRUISING OR BLEEDING *URINARY PROBLEMS (pain or burning when urinating, or frequent urination) *BOWEL PROBLEMS (unusual diarrhea, constipation, pain near the anus) TENDERNESS IN MOUTH AND THROAT WITH OR WITHOUT PRESENCE OF ULCERS (sore throat, sores in mouth, or a toothache) UNUSUAL RASH, SWELLING OR PAIN  UNUSUAL VAGINAL DISCHARGE OR ITCHING   Items with * indicate a potential emergency and should be followed up as soon as possible or go to the Emergency Department if any problems should occur.  Please show the  CHEMOTHERAPY ALERT CARD or IMMUNOTHERAPY ALERT CARD at check-in to the Emergency Department and triage nurse.  Should you have questions after your visit or need to cancel or reschedule your appointment, please contact [facility]  Dept: [phone]  and follow the prompts.  Office hours are 8:00 a.m. to 4:30 p.m. Monday - Friday. Please note that voicemails left after 4:00 p.m. may not be returned until the following business day.  We are closed weekends and major holidays. You have access to a nurse at all times for urgent questions. Please call the main number to the clinic Dept: [phone] and follow the prompts.   For any non-urgent questions, you may also contact your provider using MyChart. We now offer e-Visits for anyone 4 and older to request care online for non-urgent symptoms. For details visit [email or web address].   Also download the MyChart app! Go to the app store, search "MyChart", open the app, select [facility], and log in with your MyChart username and password.  Irinotecan Injection What is this medication? IRINOTECAN (ir in oh TEE kan) treats some types of cancer. It works by slowing down the growth of cancer cells. This medicine may be used for other purposes; ask your health care provider or pharmacist if you have questions. COMMON BRAND NAME(S): Camptosar What should I tell my care team before I take this medication? They need to know if you have any of these conditions: Dehydration Diarrhea Infection, especially a viral infection, such as chickenpox, cold sores, herpes Liver disease Low blood cell levels (white cells, red cells, and platelets) Low levels of electrolytes,  such as calcium, magnesium, or potassium in your blood Recent or ongoing radiation An unusual or allergic reaction to irinotecan, other medications, foods, dyes, or preservatives If you or your partner are pregnant or trying to get  pregnant Breast-feeding How should I use this medication? This medication is injected into a vein. It is given by your care team in a hospital or clinic setting. Talk to your care team about the use of this medication in children. Special care may be needed. Overdosage: If you think you have taken too much of this medicine contact a poison control center or emergency room at once. NOTE: This medicine is only for you. Do not share this medicine with others. What if I miss a dose? Keep appointments for follow-up doses. It is important not to miss your dose. Call your care team if you are unable to keep an appointment. What may interact with this medication? Do not take this medication with any of the following: Cobicistat Itraconazole This medication may also interact with the following: Certain antibiotics, such as clarithromycin, rifampin, rifabutin Certain antivirals for HIV or AIDS Certain medications for fungal infections, such as ketoconazole, posaconazole, voriconazole Certain medications for seizures, such as carbamazepine, phenobarbital, phenytoin Gemfibrozil Nefazodone St. John's wort This list may not describe all possible interactions. Give your health care provider a list of all the medicines, herbs, non-prescription drugs, or dietary supplements you use. Also tell them if you smoke, drink alcohol, or use illegal drugs. Some items may interact with your medicine. What should I watch for while using this medication? Your condition will be monitored carefully while you are receiving this medication. You may need blood work while taking this medication. This medication may make you feel generally unwell. This is not uncommon as chemotherapy can affect healthy cells as well as cancer cells. Report any side effects. Continue your course of treatment even though you feel ill unless your care team tells you to stop. This medication can cause serious side effects. To reduce the risk, your  care team may give you other medications to take before receiving this one. Be sure to follow the directions from your care team. This medication may affect your coordination, reaction time, or judgement. Do not drive or operate machinery until you know how this medication affects you. Sit up or stand slowly to reduce the risk of dizzy or fainting spells. Drinking alcohol with this medication can increase the risk of these side effects. This medication may increase your risk of getting an infection. Call your care team for advice if you get a fever, chills, sore throat, or other symptoms of a cold or flu. Do not treat yourself. Try to avoid being around people who are sick. Avoid taking medications that contain aspirin, acetaminophen, ibuprofen, naproxen, or ketoprofen unless instructed by your care team. These medications may hide a fever. This medication may increase your risk to bruise or bleed. Call your care team if you notice any unusual bleeding. Be careful brushing or flossing your teeth or using a toothpick because you may get an infection or bleed more easily. If you have any dental work done, tell your dentist you are receiving this medication. Talk to your care team if you or your partner are pregnant or think either of you might be pregnant. This medication can cause serious birth defects if taken during pregnancy and for 6 months after the last dose. You will need a negative pregnancy test before starting this medication. Contraception is recommended while  taking this medication and for 6 months after the last dose. Your care team can help you find the option that works for you. Do not father a child while taking this medication and for 3 months after the last dose. Use a condom for contraception during this time period. Do not breastfeed while taking this medication and for 7 days after the last dose. This medication may cause infertility. Talk to your care team if you are concerned about  your fertility. What side effects may I notice from receiving this medication? Side effects that you should report to your care team as soon as possible: Allergic reactions--skin rash, itching, hives, swelling of the face, lips, tongue, or throat Dry cough, shortness of breath or trouble breathing Increased saliva or tears, increased sweating, stomach cramping, diarrhea, small pupils, unusual weakness or fatigue, slow heartbeat Infection--fever, chills, cough, sore throat, wounds that don't heal, pain or trouble when passing urine, general feeling of discomfort or being unwell Kidney injury--decrease in the amount of urine, swelling of the ankles, hands, or feet Low red blood cell level--unusual weakness or fatigue, dizziness, headache, trouble breathing Severe or prolonged diarrhea Unusual bruising or bleeding Side effects that usually do not require medical attention (report to your care team if they continue or are bothersome): Constipation Diarrhea Hair loss Loss of appetite Nausea Stomach pain This list may not describe all possible side effects. Call your doctor for medical advice about side effects. You may report side effects to FDA at 1-800-FDA-1088. Where should I keep my medication? This medication is given in a hospital or clinic. It will not be stored at home. NOTE: This sheet is a summary. It may not cover all possible information. If you have questions about this medicine, talk to your doctor, pharmacist, or health care provider.  2023 Elsevier/Gold Standard (2022-04-08 00:00:00)  Leucovorin Injection What is this medication? LEUCOVORIN (loo koe VOR in) prevents side effects from certain medications, such as methotrexate. It works by increasing folate levels. This helps protect healthy cells in your body. It may also be used to treat anemia caused by low levels of folate. It can also be used with fluorouracil, a type of chemotherapy, to treat colorectal cancer. It works by  increasing the effects of fluorouracil in the body. This medicine may be used for other purposes; ask your health care provider or pharmacist if you have questions. What should I tell my care team before I take this medication? They need to know if you have any of these conditions: Anemia from low levels of vitamin B12 in the blood An unusual or allergic reaction to leucovorin, folic acid, other medications, foods, dyes, or preservatives Pregnant or trying to get pregnant Breastfeeding How should I use this medication? This medication is injected into a vein or a muscle. It is given by your care team in a hospital or clinic setting. Talk to your care team about the use of this medication in children. Special care may be needed. Overdosage: If you think you have taken too much of this medicine contact a poison control center or emergency room at once. NOTE: This medicine is only for you. Do not share this medicine with others. What if I miss a dose? Keep appointments for follow-up doses. It is important not to miss your dose. Call your care team if you are unable to keep an appointment. What may interact with this medication? Capecitabine Fluorouracil Phenobarbital Phenytoin Primidone Trimethoprim;sulfamethoxazole This list may not describe all possible interactions. Give  your health care provider a list of all the medicines, herbs, non-prescription drugs, or dietary supplements you use. Also tell them if you smoke, drink alcohol, or use illegal drugs. Some items may interact with your medicine. What should I watch for while using this medication? Your condition will be monitored carefully while you are receiving this medication. This medication may increase the side effects of 5-fluorouracil. Tell your care team if you have diarrhea or mouth sores that do not get better or that get worse. What side effects may I notice from receiving this medication? Side effects that you should report to  your care team as soon as possible: Allergic reactions--skin rash, itching, hives, swelling of the face, lips, tongue, or throat This list may not describe all possible side effects. Call your doctor for medical advice about side effects. You may report side effects to FDA at 1-800-FDA-1088. Where should I keep my medication? This medication is given in a hospital or clinic. It will not be stored at home. NOTE: This sheet is a summary. It may not cover all possible information. If you have questions about this medicine, talk to your doctor, pharmacist, or health care provider.  2023 Elsevier/Gold Standard (2022-04-09 00:00:00)  Fluorouracil Injection What is this medication? FLUOROURACIL (flure oh YOOR a sil) treats some types of cancer. It works by slowing down the growth of cancer cells. This medicine may be used for other purposes; ask your health care provider or pharmacist if you have questions. COMMON BRAND NAME(S): Adrucil What should I tell my care team before I take this medication? They need to know if you have any of these conditions: Blood disorders Dihydropyrimidine dehydrogenase (DPD) deficiency Infection, such as chickenpox, cold sores, herpes Kidney disease Liver disease Poor nutrition Recent or ongoing radiation therapy An unusual or allergic reaction to fluorouracil, other medications, foods, dyes, or preservatives If you or your partner are pregnant or trying to get pregnant Breast-feeding How should I use this medication? This medication is injected into a vein. It is administered by your care team in a hospital or clinic setting. Talk to your care team about the use of this medication in children. Special care may be needed. Overdosage: If you think you have taken too much of this medicine contact a poison control center or emergency room at once. NOTE: This medicine is only for you. Do not share this medicine with others. What if I miss a dose? Keep appointments  for follow-up doses. It is important not to miss your dose. Call your care team if you are unable to keep an appointment. What may interact with this medication? Do not take this medication with any of the following: Live virus vaccines This medication may also interact with the following: Medications that treat or prevent blood clots, such as warfarin, enoxaparin, dalteparin This list may not describe all possible interactions. Give your health care provider a list of all the medicines, herbs, non-prescription drugs, or dietary supplements you use. Also tell them if you smoke, drink alcohol, or use illegal drugs. Some items may interact with your medicine. What should I watch for while using this medication? Your condition will be monitored carefully while you are receiving this medication. This medication may make you feel generally unwell. This is not uncommon as chemotherapy can affect healthy cells as well as cancer cells. Report any side effects. Continue your course of treatment even though you feel ill unless your care team tells you to stop. In some cases, you  may be given additional medications to help with side effects. Follow all directions for their use. This medication may increase your risk of getting an infection. Call your care team for advice if you get a fever, chills, sore throat, or other symptoms of a cold or flu. Do not treat yourself. Try to avoid being around people who are sick. This medication may increase your risk to bruise or bleed. Call your care team if you notice any unusual bleeding. Be careful brushing or flossing your teeth or using a toothpick because you may get an infection or bleed more easily. If you have any dental work done, tell your dentist you are receiving this medication. Avoid taking medications that contain aspirin, acetaminophen, ibuprofen, naproxen, or ketoprofen unless instructed by your care team. These medications may hide a fever. Do not treat  diarrhea with over the counter products. Contact your care team if you have diarrhea that lasts more than 2 days or if it is severe and watery. This medication can make you more sensitive to the sun. Keep out of the sun. If you cannot avoid being in the sun, wear protective clothing and sunscreen. Do not use sun lamps, tanning beds, or tanning booths. Talk to your care team if you or your partner wish to become pregnant or think you might be pregnant. This medication can cause serious birth defects if taken during pregnancy and for 3 months after the last dose. A reliable form of contraception is recommended while taking this medication and for 3 months after the last dose. Talk to your care team about effective forms of contraception. Do not father a child while taking this medication and for 3 months after the last dose. Use a condom while having sex during this time period. Do not breastfeed while taking this medication. This medication may cause infertility. Talk to your care team if you are concerned about your fertility. What side effects may I notice from receiving this medication? Side effects that you should report to your care team as soon as possible: Allergic reactions--skin rash, itching, hives, swelling of the face, lips, tongue, or throat Heart attack--pain or tightness in the chest, shoulders, arms, or jaw, nausea, shortness of breath, cold or clammy skin, feeling faint or lightheaded Heart failure--shortness of breath, swelling of the ankles, feet, or hands, sudden weight gain, unusual weakness or fatigue Heart rhythm changes--fast or irregular heartbeat, dizziness, feeling faint or lightheaded, chest pain, trouble breathing High ammonia level--unusual weakness or fatigue, confusion, loss of appetite, nausea, vomiting, seizures Infection--fever, chills, cough, sore throat, wounds that don't heal, pain or trouble when passing urine, general feeling of discomfort or being unwell Low red  blood cell level--unusual weakness or fatigue, dizziness, headache, trouble breathing Pain, tingling, or numbness in the hands or feet, muscle weakness, change in vision, confusion or trouble speaking, loss of balance or coordination, trouble walking, seizures Redness, swelling, and blistering of the skin over hands and feet Severe or prolonged diarrhea Unusual bruising or bleeding Side effects that usually do not require medical attention (report to your care team if they continue or are bothersome): Dry skin Headache Increased tears Nausea Pain, redness, or swelling with sores inside the mouth or throat Sensitivity to light Vomiting This list may not describe all possible side effects. Call your doctor for medical advice about side effects. You may report side effects to FDA at 1-800-FDA-1088. Where should I keep my medication? This medication is given in a hospital or clinic. It will not  be stored at home. NOTE: This sheet is a summary. It may not cover all possible information. If you have questions about this medicine, talk to your doctor, pharmacist, or health care provider.  2023 Elsevier/Gold Standard (2022-03-30 00:00:00)  The chemotherapy medication bag should finish at 46 hours, 96 hours, or 7 days. For example, if your pump is scheduled for 46 hours and it was put on at 4:00 p.m., it should finish at 2:00 p.m. the day it is scheduled to come off regardless of your appointment time.     Estimated time to finish at 12:00 Friday, Apr 29, 2023.   If the display on your pump reads "Low Volume" and it is beeping, take the batteries out of the pump and come to the cancer center for it to be taken off.   If the pump alarms go off prior to the pump reading "Low Volume" then call [phone] and someone can assist you.  If the plunger comes out and the chemotherapy medication is leaking out, please use your home chemo spill kit to clean up the spill. Do NOT use paper towels or other  household products.  If you have problems or questions regarding your pump, please call either [phone] (24 hours a day) or the cancer center Monday-Friday 8:00 a.m.- 4:30 p.m. at the clinic number and we will assist you. If you are unable to get assistance, then go to the nearest Emergency Department and ask the staff to contact the IV team for assistance.

## 2023-04-28 ENCOUNTER — Telehealth: Payer: Self-pay

## 2023-04-28 NOTE — Telephone Encounter (Signed)
A patient contacted our office to report a recurrence of pain in her leg. She described her calf as feeling hard, painful, and swollen. She requested an assessment from the healthcare provider. The provider has agreed to see the patient the following day.

## 2023-04-29 ENCOUNTER — Inpatient Hospital Stay (HOSPITAL_BASED_OUTPATIENT_CLINIC_OR_DEPARTMENT_OTHER): Payer: Medicaid Other | Admitting: Nurse Practitioner

## 2023-04-29 ENCOUNTER — Inpatient Hospital Stay: Payer: Medicaid Other

## 2023-04-29 ENCOUNTER — Encounter: Payer: Self-pay | Admitting: Nurse Practitioner

## 2023-04-29 VITALS — BP 119/71 | HR 91 | Temp 98.3°F | Resp 20

## 2023-04-29 DIAGNOSIS — C259 Malignant neoplasm of pancreas, unspecified: Secondary | ICD-10-CM

## 2023-04-29 DIAGNOSIS — C250 Malignant neoplasm of head of pancreas: Secondary | ICD-10-CM

## 2023-04-29 DIAGNOSIS — Z5111 Encounter for antineoplastic chemotherapy: Secondary | ICD-10-CM | POA: Diagnosis not present

## 2023-04-29 LAB — CANCER ANTIGEN 19-9: CA 19-9: 10832 U/mL — ABNORMAL HIGH (ref 0–35)

## 2023-04-29 MED ORDER — FLUCONAZOLE 100 MG PO TABS
100.0000 mg | ORAL_TABLET | Freq: Every day | ORAL | 0 refills | Status: DC
Start: 2023-04-29 — End: 2023-06-15

## 2023-04-29 MED ORDER — HEPARIN SOD (PORK) LOCK FLUSH 100 UNIT/ML IV SOLN
500.0000 [IU] | Freq: Once | INTRAVENOUS | Status: AC | PRN
  Administered 2023-04-29: 500 [IU]

## 2023-04-29 MED ORDER — MORPHINE SULFATE (CONCENTRATE) 10 MG/0.5ML PO SOLN
20.0000 mg | Freq: Three times a day (TID) | ORAL | 0 refills | Status: DC | PRN
Start: 2023-04-29 — End: 2023-07-14

## 2023-04-29 MED ORDER — SODIUM CHLORIDE 0.9% FLUSH
10.0000 mL | INTRAVENOUS | Status: DC | PRN
  Administered 2023-04-29: 10 mL

## 2023-04-29 MED ORDER — PEGFILGRASTIM-CBQV 6 MG/0.6ML ~~LOC~~ SOSY
6.0000 mg | PREFILLED_SYRINGE | Freq: Once | SUBCUTANEOUS | Status: AC
  Administered 2023-04-29: 6 mg via SUBCUTANEOUS
  Filled 2023-04-29: qty 0.6

## 2023-04-29 NOTE — Patient Instructions (Signed)

## 2023-04-29 NOTE — Progress Notes (Signed)
[facility] OFFICE PROGRESS NOTE   Diagnosis: Pancreas cancer  INTERVAL HISTORY:   Wendy Hoffman is seen in an unscheduled visit for evaluation of leg swelling.  She had venous Doppler studies 04/12/2023 and 04/22/2023, both negative for evidence of DVT.  She completed a cycle of FOLFIRI 04/27/2023.  She came in today for pump discontinuation.  She reports the right leg swelling was better on 04/25/2023.  She again noted swelling on 04/27/2023.  Skin on both legs is sensitive to touch.  Objective:  Vital signs in last 24 hours:  Blood pressure 119/71, pulse 91, temperature 98.3 F (36.8 C), temperature source Oral, resp. rate 20, height 5\' 4"  (1.626 m), weight 200 lb (90.7 kg), SpO2 100 %.    HEENT: Thick white coating over time, scattered white patches bilateral buccal mucosa. Lymphatics: No palpable inguinal lymph nodes. Resp: Lungs clear bilaterally. Cardio: Regular rate and rhythm. GI: Abdomen is soft with tenderness at the right upper abdomen. Vascular: Trace edema left lower leg.  1+ edema right lower leg.  Right thigh appears larger than the left thigh.  Overall the right leg edema appears improved as compared to her last exam. Neuro: Alert and oriented. Port-A-Cath without erythema.   Lab Results:  Lab Results  Component Value Date   WBC 3.8 (L) 04/27/2023   HGB 9.2 (L) 04/27/2023   HCT 28.9 (L) 04/27/2023   MCV 101.0 (H) 04/27/2023   PLT 186 04/27/2023   NEUTROABS 2.1 04/27/2023    Imaging:  No results found.  Medications: I have reviewed the patient's current medications.  Assessment/Plan: Pancreas cancer -CT abdomen/pelvis without contrast 02/16/2021-multiple large hypoechoic masses in the patient's liver consistent metastatic disease, ill-defined masslike area in the pancreatic head measuring approximate 4.7 cm, possible filling defect in the right ventricle. -EGD 02/19/2021-large infiltrative mass with bleeding in the second portion of the  duodenum, appears to be arising near the ampulla, partially obstructive with friable mucosa.  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 -Flexible sigmoidoscopy 02/19/2021-diverticulosis in the sigmoid colon, nonbleeding external and internal hemorrhoids -Cardiac CT 02/20/2021-mass in the mid RV attached to the interventricular septum measuring 16 mm x 6 mm and concerning for metastatic tumor -MRI abdomen with/without contrast 02/21/2021-mass in the posterior pancreatic head measuring 4.3 x 3.8 cm with obstruction of the central pancreatic duct and diffuse dilatation up to 6 mm consistent with pancreatic adenocarcinoma, liver lesions the largest mass of the central left lobe measuring 9.9 x 9.2 cm consistent with hepatic metastatic disease,  hepatomegaly. -Ultrasound-guided biopsy of a left liver lesion 02/24/2021-adenocarcinoma, morphologically similar to the duodenal biopsy; microsatellite stable, tumor mutation burden 1 -Negative genetic testing -Palliative radiation to the duodenal mass 02/25/2021-03/06/2021 -Cycle 1 FOLFOX 03/17/2021 -Cycle 2 FOLFOX 04/01/2021 -Cycle 3 FOLFIRINOX 04/14/2021 -Cycle 4 FOLFIRINOX 04/27/2021, Udenyca added -Cycle 5 FOLFIRINOX 05/12/2021, Udenyca -MRI abdomen 05/23/2021-decrease in size of pancreas mass and hepatic lesions, no progressive disease -Cycle 6 FOLFIRINOX 05/27/2021 -CT 06/17/2021-multiple liver metastases similar to her prior exam.  Pancreas mass not significantly changed. -CT 06/26/2021-stable pancreas mass, no significant change in size and number of known liver metastases. -CT abdomen/pelvis 07/12/2021-new gastrostomy tube.  Multiple liver masses slightly decreased in size from prior exam.  Pancreas head mass unchanged. -Cycle 1 gemcitabine/Abraxane 07/30/2021 -Cycle 2 gemcitabine/Abraxane 08/13/2021 -Cycle 3 gemcitabine 08/27/2021, Abraxane held due to significant bilateral toe pain, question neuropathy -Cycle 4 gemcitabine, Abraxane held due  to neuropathy, 09/10/2021 -Cycle 5 gemcitabine, Abraxane held due to neuropathy, 09/24/2021 -Cycle 6 gemcitabine, Abraxane  held due to neuropathy 10/16/2021 -CTs 10/29/2021-mild progression of multifocal liver metastases; mild increase in size of hypoenhancing mass within the head of the pancreas; new mild pleural nodularity along the posterior lower lobes -Cycle 7 gemcitabine/Abraxane 11/03/2021 -Cycle 8 gemcitabine/Abraxane 11/23/2021 -Cycle 9 gemcitabine/Abraxane 12/10/2021 -Cycle 10 Gemcitabine/Abraxane 12/24/2021 -CT abdomen/pelvis 01/04/2022-decrease size of pancreas head mass, no change in multifocal hepatic metastases -Cycle 11 gemcitabine/Abraxane 01/07/2022 -Cycle 12 gemcitabine/Abraxane 02/04/2022 -Cycle 13 Gemcitabine/Abraxane 02/18/2022 -Cycle 14 gemcitabine/Abraxane 03/11/2022 -Cycle 15 gemcitabine/Abraxane 03/29/2022 -Cycle 16 gemcitabine/Abraxane 04/16/2022 -CT abdomen/pelvis 04/22/2022-mixed response in the liver, some lesions larger, some smaller, no change in pancreas head mass, no evidence of metastatic disease elsewhere in the abdomen or pelvis -Cycle 17 gemcitabine/Abraxane 05/17/2022 -Cycle 18 Gemcitabine/Abraxane 06/01/2022 -Cycle 19 gemcitabine/Abraxane 06/18/2022 -Cycle 20 gemcitabine/Abraxane 07/05/2022 -Cycle 21 gemcitabine/Abraxane 08/09/2022 -Cycle 22 gemcitabine/Abraxane 08/23/2022 -CT abdomen/pelvis 09/01/2022-stable liver metastases, difficult to visualize the pancreas head mass, no evidence of disease progression -Cycle 23 gemcitabine/Abraxane 09/06/2022 -Cycle 24 gemcitabine/Abraxane 09/20/2022 -Cycle 25 gemcitabine/Abraxane 10/18/2022 -Cycle 26 gemcitabine/Abraxane 11/01/2022 -Cycle 27 gemcitabine/Abraxane 11/29/2022 -CT abdomen/pelvis 12/14/2022-increased size of liver metastases, less well-defined pancreas mass, 4 mm right lung nodule -Cycle 1 FOLFIRINOX 01/10/2023 -Cycle 2 FOLFIRINOX 01/24/2023, 5-FU dose reduced secondary to mucositis, chemotherapy plan dose reduce for weight  loss -Develop pruritus during the oxaliplatin infusion, initial improvement with Benadryl and Solu-Medrol, oxaliplatin was resumed and the pruritus recurred.  Oxaliplatin was discontinued. -Chemotherapy held 02/07/2023 due to diarrhea, nausea/vomiting, dehydration -Cycle 3 FOLFIRINOX 03/07/2023 -Cycle 4 FOLFIRINOX 03/22/2023, 5-FU dose reduction due to mucositis, chemotherapy plan dose reduction for weight loss -CTs 03/30/2021-numerous bulky liver lesions, majority are slightly diminished in size though some lesions are unchanged or slightly enlarged. -Cycle 5 FOLFIRINOX 04/05/2023 -04/05/2023 reaction to oxaliplatin with dyspnea and pruritus -FOLFIRI every 3 weeks beginning 04/27/2023 2.  Iron deficiency anemia-improved -Feraheme 510 mg 02/20/2021 3.  Protein calorie malnutrition 4.  Possible right ventricular filling defect on noncontrast CT scan MRI cardiac morphology 02/21/2021-mass in the mid RV attached to the interventricular septum measures 16 mm x 6 mm.  Mass appears heterogeneous with area of enhancement on LGE imaging.  Mass is concerning for metastatic tumor.  Normal LV size and systolic function.  Normal RV size and systolic function. 5.  Hypertension 6.  History of CVA 7.  Hyperlipidemia 8.  Hypothyroidism 9.  Morbid obesity 10.  Asthma 11.  Migraines 12.  Pain secondary to #1 13.  Port-A-Cath placement 02/25/2021 14.  Hypokalemia 04/01/2021-started a potassium supplement 15.  Hospital admission 06/17/2021-intractable nausea and vomiting EGD 06/19/2021-diffuse gastritis, nonobstructing duodenal mass, biopsy of stomach-no malignancy or H. pylori, duodenal mass-adenocarcinoma 16.  Hospital admission 07/12/2021 with nausea/vomiting, abdominal pain, and hypokalemia 17.  Percutaneous gastrostomy tube placement 07/01/2021 18.  07/21/2022 bilateral lower extremity venous Doppler study-negative for DVT.  Disposition: Ms. Vandivier appears stable.  She is seen today for evaluation of intermittent edema  of the right leg.  Edema appears improved today as compared to her last exam.  She also has trace edema of the right lower leg.  She had a negative venous Doppler study 04/12/2023 and again on 04/22/2023.  We discussed elevation and support stockings.  She has mild hypoalbuminemia.  She will work on increasing protein intake.  She has oral candidiasis.  Prescription for fluconazole sent to her pharmacy.  She will return for follow-up as scheduled 05/16/2023.  She will contact the office in the interim with further problems.      Wendy Hoffman ANP/GNP-BC   04/29/2023  1:58 PM

## 2023-04-30 ENCOUNTER — Other Ambulatory Visit: Payer: Self-pay

## 2023-05-02 ENCOUNTER — Other Ambulatory Visit: Payer: Self-pay

## 2023-05-04 ENCOUNTER — Other Ambulatory Visit (HOSPITAL_BASED_OUTPATIENT_CLINIC_OR_DEPARTMENT_OTHER): Payer: Self-pay

## 2023-05-05 ENCOUNTER — Other Ambulatory Visit (HOSPITAL_BASED_OUTPATIENT_CLINIC_OR_DEPARTMENT_OTHER): Payer: Self-pay

## 2023-05-09 ENCOUNTER — Other Ambulatory Visit: Payer: Self-pay | Admitting: Oncology

## 2023-05-09 DIAGNOSIS — C250 Malignant neoplasm of head of pancreas: Secondary | ICD-10-CM

## 2023-05-10 ENCOUNTER — Telehealth: Payer: Self-pay

## 2023-05-10 ENCOUNTER — Other Ambulatory Visit (HOSPITAL_BASED_OUTPATIENT_CLINIC_OR_DEPARTMENT_OTHER): Payer: Self-pay

## 2023-05-10 ENCOUNTER — Other Ambulatory Visit: Payer: Self-pay | Admitting: Nurse Practitioner

## 2023-05-10 DIAGNOSIS — C250 Malignant neoplasm of head of pancreas: Secondary | ICD-10-CM

## 2023-05-10 DIAGNOSIS — C259 Malignant neoplasm of pancreas, unspecified: Secondary | ICD-10-CM

## 2023-05-10 MED ORDER — OXYCODONE HCL 10 MG PO TABS
10.0000 mg | ORAL_TABLET | ORAL | 0 refills | Status: DC | PRN
Start: 2023-05-10 — End: 2023-05-25
  Filled 2023-05-10: qty 60, 5d supply, fill #0

## 2023-05-10 MED ORDER — MORPHINE SULFATE ER 60 MG PO TBCR
60.0000 mg | EXTENDED_RELEASE_TABLET | Freq: Two times a day (BID) | ORAL | 0 refills | Status: DC
Start: 2023-05-10 — End: 2023-06-13
  Filled 2023-05-10: qty 60, 30d supply, fill #0

## 2023-05-10 NOTE — Telephone Encounter (Signed)
Patient called in request a refill of her Morphine 60 mg and Oxycodone 10 mg.

## 2023-05-11 ENCOUNTER — Other Ambulatory Visit (HOSPITAL_BASED_OUTPATIENT_CLINIC_OR_DEPARTMENT_OTHER): Payer: Self-pay

## 2023-05-11 ENCOUNTER — Encounter: Payer: Self-pay | Admitting: Oncology

## 2023-05-16 ENCOUNTER — Inpatient Hospital Stay: Payer: Medicaid Other

## 2023-05-16 ENCOUNTER — Inpatient Hospital Stay: Payer: Medicaid Other | Admitting: Nurse Practitioner

## 2023-05-16 ENCOUNTER — Telehealth: Payer: Self-pay | Admitting: Oncology

## 2023-05-16 ENCOUNTER — Inpatient Hospital Stay: Payer: Medicaid Other | Attending: Nurse Practitioner

## 2023-05-16 DIAGNOSIS — Z5111 Encounter for antineoplastic chemotherapy: Secondary | ICD-10-CM | POA: Insufficient documentation

## 2023-05-16 DIAGNOSIS — C787 Secondary malignant neoplasm of liver and intrahepatic bile duct: Secondary | ICD-10-CM | POA: Insufficient documentation

## 2023-05-16 DIAGNOSIS — D509 Iron deficiency anemia, unspecified: Secondary | ICD-10-CM | POA: Insufficient documentation

## 2023-05-16 DIAGNOSIS — Z5189 Encounter for other specified aftercare: Secondary | ICD-10-CM | POA: Insufficient documentation

## 2023-05-16 DIAGNOSIS — C250 Malignant neoplasm of head of pancreas: Secondary | ICD-10-CM | POA: Insufficient documentation

## 2023-05-16 DIAGNOSIS — E46 Unspecified protein-calorie malnutrition: Secondary | ICD-10-CM | POA: Insufficient documentation

## 2023-05-17 ENCOUNTER — Other Ambulatory Visit: Payer: Self-pay

## 2023-05-25 ENCOUNTER — Inpatient Hospital Stay: Payer: Medicaid Other

## 2023-05-25 ENCOUNTER — Encounter: Payer: Self-pay | Admitting: Nurse Practitioner

## 2023-05-25 ENCOUNTER — Other Ambulatory Visit (HOSPITAL_BASED_OUTPATIENT_CLINIC_OR_DEPARTMENT_OTHER): Payer: Self-pay

## 2023-05-25 ENCOUNTER — Inpatient Hospital Stay (HOSPITAL_BASED_OUTPATIENT_CLINIC_OR_DEPARTMENT_OTHER): Payer: Medicaid Other | Admitting: Nurse Practitioner

## 2023-05-25 VITALS — BP 134/91 | HR 83

## 2023-05-25 VITALS — BP 144/92 | HR 100 | Temp 98.1°F | Resp 18 | Ht 64.0 in | Wt 213.1 lb

## 2023-05-25 DIAGNOSIS — Z5111 Encounter for antineoplastic chemotherapy: Secondary | ICD-10-CM | POA: Diagnosis present

## 2023-05-25 DIAGNOSIS — D509 Iron deficiency anemia, unspecified: Secondary | ICD-10-CM | POA: Diagnosis not present

## 2023-05-25 DIAGNOSIS — C250 Malignant neoplasm of head of pancreas: Secondary | ICD-10-CM

## 2023-05-25 DIAGNOSIS — C787 Secondary malignant neoplasm of liver and intrahepatic bile duct: Secondary | ICD-10-CM | POA: Diagnosis not present

## 2023-05-25 DIAGNOSIS — Z5189 Encounter for other specified aftercare: Secondary | ICD-10-CM | POA: Diagnosis not present

## 2023-05-25 DIAGNOSIS — E46 Unspecified protein-calorie malnutrition: Secondary | ICD-10-CM | POA: Diagnosis not present

## 2023-05-25 LAB — CMP (CANCER CENTER ONLY)
ALT: 22 U/L (ref 0–44)
AST: 30 U/L (ref 15–41)
Albumin: 3.6 g/dL (ref 3.5–5.0)
Alkaline Phosphatase: 133 U/L — ABNORMAL HIGH (ref 38–126)
Anion gap: 7 (ref 5–15)
BUN: 16 mg/dL (ref 6–20)
CO2: 27 mmol/L (ref 22–32)
Calcium: 9.1 mg/dL (ref 8.9–10.3)
Chloride: 102 mmol/L (ref 98–111)
Creatinine: 1.06 mg/dL — ABNORMAL HIGH (ref 0.44–1.00)
GFR, Estimated: >60 mL/min (ref >60)
Glucose, Bld: 107 mg/dL — ABNORMAL HIGH (ref 70–99)
Potassium: 3.9 mmol/L (ref 3.5–5.1)
Sodium: 136 mmol/L (ref 135–145)
Total Bilirubin: 0.6 mg/dL (ref 0.3–1.2)
Total Protein: 6 g/dL — ABNORMAL LOW (ref 6.5–8.1)

## 2023-05-25 LAB — CBC WITH DIFFERENTIAL (CANCER CENTER ONLY)
Abs Immature Granulocytes: 0.03 10*3/uL (ref 0.00–0.07)
Basophils Absolute: 0 10*3/uL (ref 0.0–0.1)
Basophils Relative: 0 %
Eosinophils Absolute: 0.2 10*3/uL (ref 0.0–0.5)
Eosinophils Relative: 2 %
HCT: 29.3 % — ABNORMAL LOW (ref 36.0–46.0)
Hemoglobin: 9.7 g/dL — ABNORMAL LOW (ref 12.0–15.0)
Immature Granulocytes: 0 %
Lymphocytes Relative: 12 %
Lymphs Abs: 1 10*3/uL (ref 0.7–4.0)
MCH: 32 pg (ref 26.0–34.0)
MCHC: 33.1 g/dL (ref 30.0–36.0)
MCV: 96.7 fL (ref 80.0–100.0)
Monocytes Absolute: 1 10*3/uL (ref 0.1–1.0)
Monocytes Relative: 12 %
Neutro Abs: 6.2 10*3/uL (ref 1.7–7.7)
Neutrophils Relative %: 74 %
Platelet Count: 182 10*3/uL (ref 150–400)
RBC: 3.03 MIL/uL — ABNORMAL LOW (ref 3.87–5.11)
RDW: 15.7 % — ABNORMAL HIGH (ref 11.5–15.5)
WBC Count: 8.5 10*3/uL (ref 4.0–10.5)
nRBC: 0 % (ref 0.0–0.2)

## 2023-05-25 MED ORDER — SODIUM CHLORIDE 0.9 % IV SOLN
10.0000 mg | Freq: Once | INTRAVENOUS | Status: AC
  Administered 2023-05-25: 10 mg via INTRAVENOUS
  Filled 2023-05-25: qty 10

## 2023-05-25 MED ORDER — SODIUM CHLORIDE 0.9% FLUSH
10.0000 mL | INTRAVENOUS | Status: DC | PRN
  Administered 2023-05-25: 10 mL

## 2023-05-25 MED ORDER — SODIUM CHLORIDE 0.9 % IV SOLN
200.0000 mg/m2 | Freq: Once | INTRAVENOUS | Status: AC
  Administered 2023-05-25: 376 mg via INTRAVENOUS
  Filled 2023-05-25: qty 18.8

## 2023-05-25 MED ORDER — SODIUM CHLORIDE 0.9 % IV SOLN
1000.0000 mg/m2 | INTRAVENOUS | Status: DC
  Administered 2023-05-25: 2000 mg via INTRAVENOUS
  Filled 2023-05-25: qty 40

## 2023-05-25 MED ORDER — PALONOSETRON HCL INJECTION 0.25 MG/5ML
0.2500 mg | Freq: Once | INTRAVENOUS | Status: AC
  Administered 2023-05-25: 0.25 mg via INTRAVENOUS
  Filled 2023-05-25: qty 5

## 2023-05-25 MED ORDER — BISACODYL 5 MG PO TBEC
10.0000 mg | DELAYED_RELEASE_TABLET | Freq: Every day | ORAL | 2 refills | Status: DC | PRN
Start: 2023-05-25 — End: 2023-08-09

## 2023-05-25 MED ORDER — SODIUM CHLORIDE 0.9 % IV SOLN
150.0000 mg | Freq: Once | INTRAVENOUS | Status: AC
  Administered 2023-05-25: 150 mg via INTRAVENOUS
  Filled 2023-05-25: qty 150

## 2023-05-25 MED ORDER — SODIUM CHLORIDE 0.9 % IV SOLN
100.0000 mg/m2 | Freq: Once | INTRAVENOUS | Status: AC
  Administered 2023-05-25: 200 mg via INTRAVENOUS
  Filled 2023-05-25: qty 10

## 2023-05-25 MED ORDER — ATROPINE SULFATE 1 MG/ML IV SOLN
0.5000 mg | Freq: Once | INTRAVENOUS | Status: AC | PRN
  Administered 2023-05-25: 0.5 mg via INTRAVENOUS
  Filled 2023-05-25: qty 1

## 2023-05-25 MED ORDER — SODIUM CHLORIDE 0.9 % IV SOLN: Freq: Once | INTRAVENOUS | Status: AC

## 2023-05-25 MED ORDER — OXYCODONE HCL 10 MG PO TABS
10.0000 mg | ORAL_TABLET | ORAL | 0 refills | Status: DC | PRN
Start: 2023-05-25 — End: 2023-06-08
  Filled 2023-05-25: qty 60, 5d supply, fill #0

## 2023-05-25 NOTE — Patient Instructions (Addendum)
Wendy Hoffman   Discharge Instructions: Thank you for choosing [facility] to provide your oncology and hematology care.   If you have a lab appointment with the [facility], please go directly to the [facility] and check in at the registration area.   Wear comfortable clothing and clothing appropriate for easy access to any Portacath or PICC line.   We strive to give you quality time with your provider. You may need to reschedule your appointment if you arrive late (15 or more minutes).  Arriving late affects you and other patients whose appointments are after yours.  Also, if you miss three or more appointments without notifying the office, you may be dismissed from the clinic at the provider's discretion.      For prescription refill requests, have your pharmacy contact our office and allow 72 hours for refills to be completed.    Today you received the following chemotherapy and/or immunotherapy agents Irinotecan (CAMPTOSAR), Leucovorin & Flourouracil (ADRUCIL).      To help prevent nausea and vomiting after your treatment, we encourage you to take your nausea medication as directed.  BELOW ARE SYMPTOMS THAT SHOULD BE REPORTED IMMEDIATELY: *FEVER GREATER THAN 100.4 F (38 C) OR HIGHER *CHILLS OR SWEATING *NAUSEA AND VOMITING THAT IS NOT CONTROLLED WITH YOUR NAUSEA MEDICATION *UNUSUAL SHORTNESS OF BREATH *UNUSUAL BRUISING OR BLEEDING *URINARY PROBLEMS (pain or burning when urinating, or frequent urination) *BOWEL PROBLEMS (unusual diarrhea, constipation, pain near the anus) TENDERNESS IN MOUTH AND THROAT WITH OR WITHOUT PRESENCE OF ULCERS (sore throat, sores in mouth, or a toothache) UNUSUAL RASH, SWELLING OR PAIN  UNUSUAL VAGINAL DISCHARGE OR ITCHING   Items with * indicate a potential emergency and should be followed up as soon as possible or go to the Emergency Department if any problems should occur.  Please show the  CHEMOTHERAPY ALERT CARD or IMMUNOTHERAPY ALERT CARD at check-in to the Emergency Department and triage nurse.  Should you have questions after your visit or need to cancel or reschedule your appointment, please contact [facility]  Dept: [phone]  and follow the prompts.  Office hours are 8:00 a.m. to 4:30 p.m. Monday - Friday. Please note that voicemails left after 4:00 p.m. may not be returned until the following business day.  We are closed weekends and major holidays. You have access to a nurse at all times for urgent questions. Please call the main number to the clinic Dept: [phone] and follow the prompts.   For any non-urgent questions, you may also contact your provider using MyChart. We now offer e-Visits for anyone 4 and older to request care online for non-urgent symptoms. For details visit [email or web address].   Also download the MyChart app! Go to the app store, search "MyChart", open the app, select [facility], and log in with your MyChart username and password.  Irinotecan Injection What is this medication? IRINOTECAN (ir in oh TEE kan) treats some types of cancer. It works by slowing down the growth of cancer cells. This medicine may be used for other purposes; ask your health care provider or pharmacist if you have questions. COMMON BRAND NAME(S): Camptosar What should I tell my care team before I take this medication? They need to know if you have any of these conditions: Dehydration Diarrhea Infection, especially a viral infection, such as chickenpox, cold sores, herpes Liver disease Low blood cell levels (white cells, red cells, and platelets) Low levels of electrolytes,  such as calcium, magnesium, or potassium in your blood Recent or ongoing radiation An unusual or allergic reaction to irinotecan, other medications, foods, dyes, or preservatives If you or your partner are pregnant or trying to get  pregnant Breast-feeding How should I use this medication? This medication is injected into a vein. It is given by your care team in a hospital or clinic setting. Talk to your care team about the use of this medication in children. Special care may be needed. Overdosage: If you think you have taken too much of this medicine contact a poison control center or emergency room at once. NOTE: This medicine is only for you. Do not share this medicine with others. What if I miss a dose? Keep appointments for follow-up doses. It is important not to miss your dose. Call your care team if you are unable to keep an appointment. What may interact with this medication? Do not take this medication with any of the following: Cobicistat Itraconazole This medication may also interact with the following: Certain antibiotics, such as clarithromycin, rifampin, rifabutin Certain antivirals for HIV or AIDS Certain medications for fungal infections, such as ketoconazole, posaconazole, voriconazole Certain medications for seizures, such as carbamazepine, phenobarbital, phenytoin Gemfibrozil Nefazodone St. John's wort This list may not describe all possible interactions. Give your health care provider a list of all the medicines, herbs, non-prescription drugs, or dietary supplements you use. Also tell them if you smoke, drink alcohol, or use illegal drugs. Some items may interact with your medicine. What should I watch for while using this medication? Your condition will be monitored carefully while you are receiving this medication. You may need blood work while taking this medication. This medication may make you feel generally unwell. This is not uncommon as chemotherapy can affect healthy cells as well as cancer cells. Report any side effects. Continue your course of treatment even though you feel ill unless your care team tells you to stop. This medication can cause serious side effects. To reduce the risk, your  care team may give you other medications to take before receiving this one. Be sure to follow the directions from your care team. This medication may affect your coordination, reaction time, or judgement. Do not drive or operate machinery until you know how this medication affects you. Sit up or stand slowly to reduce the risk of dizzy or fainting spells. Drinking alcohol with this medication can increase the risk of these side effects. This medication may increase your risk of getting an infection. Call your care team for advice if you get a fever, chills, sore throat, or other symptoms of a cold or flu. Do not treat yourself. Try to avoid being around people who are sick. Avoid taking medications that contain aspirin, acetaminophen, ibuprofen, naproxen, or ketoprofen unless instructed by your care team. These medications may hide a fever. This medication may increase your risk to bruise or bleed. Call your care team if you notice any unusual bleeding. Be careful brushing or flossing your teeth or using a toothpick because you may get an infection or bleed more easily. If you have any dental work done, tell your dentist you are receiving this medication. Talk to your care team if you or your partner are pregnant or think either of you might be pregnant. This medication can cause serious birth defects if taken during pregnancy and for 6 months after the last dose. You will need a negative pregnancy test before starting this medication. Contraception is recommended while  taking this medication and for 6 months after the last dose. Your care team can help you find the option that works for you. Do not father a child while taking this medication and for 3 months after the last dose. Use a condom for contraception during this time period. Do not breastfeed while taking this medication and for 7 days after the last dose. This medication may cause infertility. Talk to your care team if you are concerned about  your fertility. What side effects may I notice from receiving this medication? Side effects that you should report to your care team as soon as possible: Allergic reactions--skin rash, itching, hives, swelling of the face, lips, tongue, or throat Dry cough, shortness of breath or trouble breathing Increased saliva or tears, increased sweating, stomach cramping, diarrhea, small pupils, unusual weakness or fatigue, slow heartbeat Infection--fever, chills, cough, sore throat, wounds that don't heal, pain or trouble when passing urine, general feeling of discomfort or being unwell Kidney injury--decrease in the amount of urine, swelling of the ankles, hands, or feet Low red blood cell level--unusual weakness or fatigue, dizziness, headache, trouble breathing Severe or prolonged diarrhea Unusual bruising or bleeding Side effects that usually do not require medical attention (report to your care team if they continue or are bothersome): Constipation Diarrhea Hair loss Loss of appetite Nausea Stomach pain This list may not describe all possible side effects. Call your doctor for medical advice about side effects. You may report side effects to FDA at 1-800-FDA-1088. Where should I keep my medication? This medication is given in a hospital or clinic. It will not be stored at home. NOTE: This sheet is a summary. It may not cover all possible information. If you have questions about this medicine, talk to your doctor, pharmacist, or health care provider.  2023 Elsevier/Gold Standard (2022-04-08 00:00:00)  Leucovorin Injection What is this medication? LEUCOVORIN (loo koe VOR in) prevents side effects from certain medications, such as methotrexate. It works by increasing folate levels. This helps protect healthy cells in your body. It may also be used to treat anemia caused by low levels of folate. It can also be used with fluorouracil, a type of chemotherapy, to treat colorectal cancer. It works by  increasing the effects of fluorouracil in the body. This medicine may be used for other purposes; ask your health care provider or pharmacist if you have questions. What should I tell my care team before I take this medication? They need to know if you have any of these conditions: Anemia from low levels of vitamin B12 in the blood An unusual or allergic reaction to leucovorin, folic acid, other medications, foods, dyes, or preservatives Pregnant or trying to get pregnant Breastfeeding How should I use this medication? This medication is injected into a vein or a muscle. It is given by your care team in a hospital or clinic setting. Talk to your care team about the use of this medication in children. Special care may be needed. Overdosage: If you think you have taken too much of this medicine contact a poison control center or emergency room at once. NOTE: This medicine is only for you. Do not share this medicine with others. What if I miss a dose? Keep appointments for follow-up doses. It is important not to miss your dose. Call your care team if you are unable to keep an appointment. What may interact with this medication? Capecitabine Fluorouracil Phenobarbital Phenytoin Primidone Trimethoprim;sulfamethoxazole This list may not describe all possible interactions. Give  your health care provider a list of all the medicines, herbs, non-prescription drugs, or dietary supplements you use. Also tell them if you smoke, drink alcohol, or use illegal drugs. Some items may interact with your medicine. What should I watch for while using this medication? Your condition will be monitored carefully while you are receiving this medication. This medication may increase the side effects of 5-fluorouracil. Tell your care team if you have diarrhea or mouth sores that do not get better or that get worse. What side effects may I notice from receiving this medication? Side effects that you should report to  your care team as soon as possible: Allergic reactions--skin rash, itching, hives, swelling of the face, lips, tongue, or throat This list may not describe all possible side effects. Call your doctor for medical advice about side effects. You may report side effects to FDA at 1-800-FDA-1088. Where should I keep my medication? This medication is given in a hospital or clinic. It will not be stored at home. NOTE: This sheet is a summary. It may not cover all possible information. If you have questions about this medicine, talk to your doctor, pharmacist, or health care provider.  2023 Elsevier/Gold Standard (2022-04-09 00:00:00)  Fluorouracil Injection What is this medication? FLUOROURACIL (flure oh YOOR a sil) treats some types of cancer. It works by slowing down the growth of cancer cells. This medicine may be used for other purposes; ask your health care provider or pharmacist if you have questions. COMMON BRAND NAME(S): Adrucil What should I tell my care team before I take this medication? They need to know if you have any of these conditions: Blood disorders Dihydropyrimidine dehydrogenase (DPD) deficiency Infection, such as chickenpox, cold sores, herpes Kidney disease Liver disease Poor nutrition Recent or ongoing radiation therapy An unusual or allergic reaction to fluorouracil, other medications, foods, dyes, or preservatives If you or your partner are pregnant or trying to get pregnant Breast-feeding How should I use this medication? This medication is injected into a vein. It is administered by your care team in a hospital or clinic setting. Talk to your care team about the use of this medication in children. Special care may be needed. Overdosage: If you think you have taken too much of this medicine contact a poison control center or emergency room at once. NOTE: This medicine is only for you. Do not share this medicine with others. What if I miss a dose? Keep appointments  for follow-up doses. It is important not to miss your dose. Call your care team if you are unable to keep an appointment. What may interact with this medication? Do not take this medication with any of the following: Live virus vaccines This medication may also interact with the following: Medications that treat or prevent blood clots, such as warfarin, enoxaparin, dalteparin This list may not describe all possible interactions. Give your health care provider a list of all the medicines, herbs, non-prescription drugs, or dietary supplements you use. Also tell them if you smoke, drink alcohol, or use illegal drugs. Some items may interact with your medicine. What should I watch for while using this medication? Your condition will be monitored carefully while you are receiving this medication. This medication may make you feel generally unwell. This is not uncommon as chemotherapy can affect healthy cells as well as cancer cells. Report any side effects. Continue your course of treatment even though you feel ill unless your care team tells you to stop. In some cases, you  may be given additional medications to help with side effects. Follow all directions for their use. This medication may increase your risk of getting an infection. Call your care team for advice if you get a fever, chills, sore throat, or other symptoms of a cold or flu. Do not treat yourself. Try to avoid being around people who are sick. This medication may increase your risk to bruise or bleed. Call your care team if you notice any unusual bleeding. Be careful brushing or flossing your teeth or using a toothpick because you may get an infection or bleed more easily. If you have any dental work done, tell your dentist you are receiving this medication. Avoid taking medications that contain aspirin, acetaminophen, ibuprofen, naproxen, or ketoprofen unless instructed by your care team. These medications may hide a fever. Do not treat  diarrhea with over the counter products. Contact your care team if you have diarrhea that lasts more than 2 days or if it is severe and watery. This medication can make you more sensitive to the sun. Keep out of the sun. If you cannot avoid being in the sun, wear protective clothing and sunscreen. Do not use sun lamps, tanning beds, or tanning booths. Talk to your care team if you or your partner wish to become pregnant or think you might be pregnant. This medication can cause serious birth defects if taken during pregnancy and for 3 months after the last dose. A reliable form of contraception is recommended while taking this medication and for 3 months after the last dose. Talk to your care team about effective forms of contraception. Do not father a child while taking this medication and for 3 months after the last dose. Use a condom while having sex during this time period. Do not breastfeed while taking this medication. This medication may cause infertility. Talk to your care team if you are concerned about your fertility. What side effects may I notice from receiving this medication? Side effects that you should report to your care team as soon as possible: Allergic reactions--skin rash, itching, hives, swelling of the face, lips, tongue, or throat Heart attack--pain or tightness in the chest, shoulders, arms, or jaw, nausea, shortness of breath, cold or clammy skin, feeling faint or lightheaded Heart failure--shortness of breath, swelling of the ankles, feet, or hands, sudden weight gain, unusual weakness or fatigue Heart rhythm changes--fast or irregular heartbeat, dizziness, feeling faint or lightheaded, chest pain, trouble breathing High ammonia level--unusual weakness or fatigue, confusion, loss of appetite, nausea, vomiting, seizures Infection--fever, chills, cough, sore throat, wounds that don't heal, pain or trouble when passing urine, general feeling of discomfort or being unwell Low red  blood cell level--unusual weakness or fatigue, dizziness, headache, trouble breathing Pain, tingling, or numbness in the hands or feet, muscle weakness, change in vision, confusion or trouble speaking, loss of balance or coordination, trouble walking, seizures Redness, swelling, and blistering of the skin over hands and feet Severe or prolonged diarrhea Unusual bruising or bleeding Side effects that usually do not require medical attention (report to your care team if they continue or are bothersome): Dry skin Headache Increased tears Nausea Pain, redness, or swelling with sores inside the mouth or throat Sensitivity to light Vomiting This list may not describe all possible side effects. Call your doctor for medical advice about side effects. You may report side effects to FDA at 1-800-FDA-1088. Where should I keep my medication? This medication is given in a hospital or clinic. It will not  be stored at home. NOTE: This sheet is a summary. It may not cover all possible information. If you have questions about this medicine, talk to your doctor, pharmacist, or health care provider.  2023 Elsevier/Gold Standard (2022-03-30 00:00:00)  The chemotherapy medication bag should finish at 46 hours, 96 hours, or 7 days. For example, if your pump is scheduled for 46 hours and it was put on at 4:00 p.m., it should finish at 2:00 p.m. the day it is scheduled to come off regardless of your appointment time.     Estimated time to finish at 11:30am Friday, May 27, 2023.   If the display on your pump reads "Low Volume" and it is beeping, take the batteries out of the pump and come to the cancer center for it to be taken off.   If the pump alarms go off prior to the pump reading "Low Volume" then call [phone] and someone can assist you.  If the plunger comes out and the chemotherapy medication is leaking out, please use your home chemo spill kit to clean up the spill. Do NOT use paper towels or  other household products.  If you have problems or questions regarding your pump, please call either [phone] (24 hours a day) or the cancer center Monday-Friday 8:00 a.m.- 4:30 p.m. at the clinic number and we will assist you. If you are unable to get assistance, then go to the nearest Emergency Department and ask the staff to contact the IV team for assistance.

## 2023-05-25 NOTE — Progress Notes (Signed)
Patient seen by Lonna Cobb NP today  Vitals are not all within treatment parameters. B/P 144/92 no intervention   Labs reviewed by Lonna Cobb NP and are within treatment parameters.  Per physician team, patient is ready for treatment and there are NO modifications to the treatment plan.

## 2023-05-25 NOTE — Progress Notes (Signed)
[facility] OFFICE PROGRESS NOTE   Diagnosis: Pancreas cancer  INTERVAL HISTORY:   Wendy Hoffman returns for follow-up.  She completed a cycle of FOLFIRI 04/27/2023.  She has missed several follow-up appointments.  She had nausea for 2 days after the chemotherapy.  This is typical for her.  She reports again developing thrush.  No diarrhea.  Leg swelling is better.  Objective:  Vital signs in last 24 hours:  Blood pressure (!) 144/92, pulse 100, temperature 98.1 F (36.7 C), temperature source Oral, resp. rate 18, height 5\' 4"  (1.626 m), weight 214 lb (97.1 kg), SpO2 100 %.    HEENT: Mild white coating over tongue. Resp: Lungs clear bilaterally. Cardio: Regular rate and rhythm. GI: Abdomen soft with tenderness mainly at the right upper abdomen.  No hepatomegaly. Vascular: Trace edema lower legs/ankles. Neuro: Alert and oriented. Port-A-Cath without erythema.  Lab Results:  Lab Results  Component Value Date   WBC 8.5 05/25/2023   HGB 9.7 (L) 05/25/2023   HCT 29.3 (L) 05/25/2023   MCV 96.7 05/25/2023   PLT 182 05/25/2023   NEUTROABS 6.2 05/25/2023    Imaging:  No results found.  Medications: I have reviewed the patient's current medications.  Assessment/Plan: Pancreas cancer -CT abdomen/pelvis without contrast 02/16/2021-multiple large hypoechoic masses in the patient's liver consistent metastatic disease, ill-defined masslike area in the pancreatic head measuring approximate 4.7 cm, possible filling defect in the right ventricle. -EGD 02/19/2021-large infiltrative mass with bleeding in the second portion of the duodenum, appears to be arising near the ampulla, partially obstructive with friable mucosa.  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 -Flexible sigmoidoscopy 02/19/2021-diverticulosis in the sigmoid colon, nonbleeding external and internal hemorrhoids -Cardiac CT 02/20/2021-mass in the mid RV attached to the  interventricular septum measuring 16 mm x 6 mm and concerning for metastatic tumor -MRI abdomen with/without contrast 02/21/2021-mass in the posterior pancreatic head measuring 4.3 x 3.8 cm with obstruction of the central pancreatic duct and diffuse dilatation up to 6 mm consistent with pancreatic adenocarcinoma, liver lesions the largest mass of the central left lobe measuring 9.9 x 9.2 cm consistent with hepatic metastatic disease,  hepatomegaly. -Ultrasound-guided biopsy of a left liver lesion 02/24/2021-adenocarcinoma, morphologically similar to the duodenal biopsy; microsatellite stable, tumor mutation burden 1 -Negative genetic testing -Palliative radiation to the duodenal mass 02/25/2021-03/06/2021 -Cycle 1 FOLFOX 03/17/2021 -Cycle 2 FOLFOX 04/01/2021 -Cycle 3 FOLFIRINOX 04/14/2021 -Cycle 4 FOLFIRINOX 04/27/2021, Udenyca added -Cycle 5 FOLFIRINOX 05/12/2021, Udenyca -MRI abdomen 05/23/2021-decrease in size of pancreas mass and hepatic lesions, no progressive disease -Cycle 6 FOLFIRINOX 05/27/2021 -CT 06/17/2021-multiple liver metastases similar to her prior exam.  Pancreas mass not significantly changed. -CT 06/26/2021-stable pancreas mass, no significant change in size and number of known liver metastases. -CT abdomen/pelvis 07/12/2021-new gastrostomy tube.  Multiple liver masses slightly decreased in size from prior exam.  Pancreas head mass unchanged. -Cycle 1 gemcitabine/Abraxane 07/30/2021 -Cycle 2 gemcitabine/Abraxane 08/13/2021 -Cycle 3 gemcitabine 08/27/2021, Abraxane held due to significant bilateral toe pain, question neuropathy -Cycle 4 gemcitabine, Abraxane held due to neuropathy, 09/10/2021 -Cycle 5 gemcitabine, Abraxane held due to neuropathy, 09/24/2021 -Cycle 6 gemcitabine, Abraxane held due to neuropathy 10/16/2021 -CTs 10/29/2021-mild progression of multifocal liver metastases; mild increase in size of hypoenhancing mass within the head of the pancreas; new mild pleural nodularity along the  posterior lower lobes -Cycle 7 gemcitabine/Abraxane 11/03/2021 -Cycle 8 gemcitabine/Abraxane 11/23/2021 -Cycle 9 gemcitabine/Abraxane 12/10/2021 -Cycle 10 Gemcitabine/Abraxane 12/24/2021 -CT abdomen/pelvis 01/04/2022-decrease size of pancreas head mass, no change in multifocal  hepatic metastases -Cycle 11 gemcitabine/Abraxane 01/07/2022 -Cycle 12 gemcitabine/Abraxane 02/04/2022 -Cycle 13 Gemcitabine/Abraxane 02/18/2022 -Cycle 14 gemcitabine/Abraxane 03/11/2022 -Cycle 15 gemcitabine/Abraxane 03/29/2022 -Cycle 16 gemcitabine/Abraxane 04/16/2022 -CT abdomen/pelvis 04/22/2022-mixed response in the liver, some lesions larger, some smaller, no change in pancreas head mass, no evidence of metastatic disease elsewhere in the abdomen or pelvis -Cycle 17 gemcitabine/Abraxane 05/17/2022 -Cycle 18 Gemcitabine/Abraxane 06/01/2022 -Cycle 19 gemcitabine/Abraxane 06/18/2022 -Cycle 20 gemcitabine/Abraxane 07/05/2022 -Cycle 21 gemcitabine/Abraxane 08/09/2022 -Cycle 22 gemcitabine/Abraxane 08/23/2022 -CT abdomen/pelvis 09/01/2022-stable liver metastases, difficult to visualize the pancreas head mass, no evidence of disease progression -Cycle 23 gemcitabine/Abraxane 09/06/2022 -Cycle 24 gemcitabine/Abraxane 09/20/2022 -Cycle 25 gemcitabine/Abraxane 10/18/2022 -Cycle 26 gemcitabine/Abraxane 11/01/2022 -Cycle 27 gemcitabine/Abraxane 11/29/2022 -CT abdomen/pelvis 12/14/2022-increased size of liver metastases, less well-defined pancreas mass, 4 mm right lung nodule -Cycle 1 FOLFIRINOX 01/10/2023 -Cycle 2 FOLFIRINOX 01/24/2023, 5-FU dose reduced secondary to mucositis, chemotherapy plan dose reduce for weight loss -Develop pruritus during the oxaliplatin infusion, initial improvement with Benadryl and Solu-Medrol, oxaliplatin was resumed and the pruritus recurred.  Oxaliplatin was discontinued. -Chemotherapy held 02/07/2023 due to diarrhea, nausea/vomiting, dehydration -Cycle 3 FOLFIRINOX 03/07/2023 -Cycle 4 FOLFIRINOX 03/22/2023, 5-FU dose  reduction due to mucositis, chemotherapy plan dose reduction for weight loss -CTs 03/30/2021-numerous bulky liver lesions, majority are slightly diminished in size though some lesions are unchanged or slightly enlarged. -Cycle 5 FOLFIRINOX 04/05/2023 -04/05/2023 reaction to oxaliplatin with dyspnea and pruritus -FOLFIRI every 3 weeks beginning 04/27/2023 -FOLFIRI 05/25/2023 2.  Iron deficiency anemia-improved -Feraheme 510 mg 02/20/2021 3.  Protein calorie malnutrition 4.  Possible right ventricular filling defect on noncontrast CT scan MRI cardiac morphology 02/21/2021-mass in the mid RV attached to the interventricular septum measures 16 mm x 6 mm.  Mass appears heterogeneous with area of enhancement on LGE imaging.  Mass is concerning for metastatic tumor.  Normal LV size and systolic function.  Normal RV size and systolic function. 5.  Hypertension 6.  History of CVA 7.  Hyperlipidemia 8.  Hypothyroidism 9.  Morbid obesity 10.  Asthma 11.  Migraines 12.  Pain secondary to #1 13.  Port-A-Cath placement 02/25/2021 14.  Hypokalemia 04/01/2021-started a potassium supplement 15.  Hospital admission 06/17/2021-intractable nausea and vomiting EGD 06/19/2021-diffuse gastritis, nonobstructing duodenal mass, biopsy of stomach-no malignancy or H. pylori, duodenal mass-adenocarcinoma 16.  Hospital admission 07/12/2021 with nausea/vomiting, abdominal pain, and hypokalemia 17.  Percutaneous gastrostomy tube placement 07/01/2021 18.  07/21/2022 bilateral lower extremity venous Doppler study-negative for DVT.  Disposition: Wendy Hoffman appears stable.  She is on active treatment with FOLFIRI every 3 weeks.  Aside from mild nausea she is tolerating well.  Plan to continue the same, treatment today.  CBC and chemistry panel reviewed.  Labs adequate to proceed as above.  She will return for lab, follow-up, FOLFIRI in 3 weeks.  We are available to see her sooner if needed.  Wendy Hoffman ANP/GNP-BC   05/25/2023   10:10 AM

## 2023-05-25 NOTE — Patient Instructions (Signed)

## 2023-05-27 ENCOUNTER — Other Ambulatory Visit: Payer: Self-pay

## 2023-05-27 ENCOUNTER — Other Ambulatory Visit (HOSPITAL_BASED_OUTPATIENT_CLINIC_OR_DEPARTMENT_OTHER): Payer: Self-pay

## 2023-05-27 ENCOUNTER — Inpatient Hospital Stay: Payer: Medicaid Other

## 2023-05-27 VITALS — BP 133/79 | HR 82 | Temp 98.1°F | Resp 20

## 2023-05-27 DIAGNOSIS — C250 Malignant neoplasm of head of pancreas: Secondary | ICD-10-CM

## 2023-05-27 DIAGNOSIS — Z5111 Encounter for antineoplastic chemotherapy: Secondary | ICD-10-CM | POA: Diagnosis not present

## 2023-05-27 MED ORDER — HEPARIN SOD (PORK) LOCK FLUSH 100 UNIT/ML IV SOLN
500.0000 [IU] | Freq: Once | INTRAVENOUS | Status: AC | PRN
  Administered 2023-05-27: 500 [IU]

## 2023-05-27 MED ORDER — SODIUM CHLORIDE 0.9% FLUSH
10.0000 mL | INTRAVENOUS | Status: DC | PRN
  Administered 2023-05-27: 10 mL

## 2023-05-27 MED ORDER — PEGFILGRASTIM-CBQV 6 MG/0.6ML ~~LOC~~ SOSY
6.0000 mg | PREFILLED_SYRINGE | Freq: Once | SUBCUTANEOUS | Status: AC
  Administered 2023-05-27: 6 mg via SUBCUTANEOUS
  Filled 2023-05-27: qty 0.6

## 2023-05-31 ENCOUNTER — Telehealth: Payer: Self-pay | Admitting: *Deleted

## 2023-05-31 LAB — CANCER ANTIGEN 19-9: CA 19-9: 9043 U/mL — ABNORMAL HIGH (ref 0–35)

## 2023-05-31 NOTE — Telephone Encounter (Signed)
Received VM and fax from [facility] reporting patient has requested several of her routine meds be transferred to their pharmacy.  Left VM for patient and MyChart message requesting she confirm this is accurate.

## 2023-05-31 NOTE — Telephone Encounter (Signed)
Patient called back to confirm the new pharmacy and needs 90 day supplies.

## 2023-06-01 ENCOUNTER — Other Ambulatory Visit: Payer: Self-pay | Admitting: Oncology

## 2023-06-01 ENCOUNTER — Other Ambulatory Visit: Payer: Self-pay | Admitting: *Deleted

## 2023-06-01 DIAGNOSIS — C250 Malignant neoplasm of head of pancreas: Secondary | ICD-10-CM

## 2023-06-01 DIAGNOSIS — C259 Malignant neoplasm of pancreas, unspecified: Secondary | ICD-10-CM

## 2023-06-01 DIAGNOSIS — J4540 Moderate persistent asthma, uncomplicated: Secondary | ICD-10-CM

## 2023-06-01 MED ORDER — ONDANSETRON 8 MG PO TBDP
8.0000 mg | ORAL_TABLET | Freq: Three times a day (TID) | ORAL | 2 refills | Status: DC | PRN
Start: 2023-06-01 — End: 2023-08-09

## 2023-06-01 MED ORDER — SYMBICORT 160-4.5 MCG/ACT IN AERO
2.0000 | INHALATION_SPRAY | Freq: Two times a day (BID) | RESPIRATORY_TRACT | 2 refills | Status: DC
Start: 2023-06-01 — End: 2023-08-11

## 2023-06-01 MED ORDER — GABAPENTIN 300 MG PO CAPS
300.0000 mg | ORAL_CAPSULE | Freq: Every day | ORAL | 0 refills | Status: DC
Start: 2023-06-01 — End: 2023-08-09

## 2023-06-01 MED ORDER — SCOPOLAMINE 1 MG/3DAYS TD PT72
1.0000 | MEDICATED_PATCH | TRANSDERMAL | 2 refills | Status: DC
Start: 2023-06-01 — End: 2023-08-09

## 2023-06-01 MED ORDER — PANTOPRAZOLE SODIUM 40 MG PO TBEC
40.0000 mg | DELAYED_RELEASE_TABLET | Freq: Two times a day (BID) | ORAL | 2 refills | Status: DC
Start: 2023-06-01 — End: 2023-08-09

## 2023-06-01 MED ORDER — PROCHLORPERAZINE MALEATE 10 MG PO TABS
ORAL_TABLET | ORAL | 2 refills | Status: DC
Start: 2023-06-01 — End: 2023-08-09

## 2023-06-01 MED ORDER — ALBUTEROL SULFATE HFA 108 (90 BASE) MCG/ACT IN AERS
INHALATION_SPRAY | RESPIRATORY_TRACT | 1 refills | Status: DC
Start: 2023-06-01 — End: 2023-08-09

## 2023-06-01 MED ORDER — DULOXETINE HCL 30 MG PO CPEP: 60.0000 mg | ORAL_CAPSULE | Freq: Every day | ORAL | 2 refills | Status: DC

## 2023-06-01 MED ORDER — ONDANSETRON HCL 8 MG PO TABS: 8.0000 mg | ORAL_TABLET | Freq: Three times a day (TID) | ORAL | 2 refills | Status: DC | PRN

## 2023-06-01 NOTE — Telephone Encounter (Signed)
Changing pharmacy.

## 2023-06-01 NOTE — Telephone Encounter (Signed)
Informed patient that Dr. Truett Perna agreed to send all her non-narcotic scripts to [facility]. Declines to send 90 day fill on ambien. This needs to stay local and she agrees.

## 2023-06-06 ENCOUNTER — Ambulatory Visit: Payer: Medicaid Other

## 2023-06-06 ENCOUNTER — Other Ambulatory Visit: Payer: Medicaid Other

## 2023-06-06 ENCOUNTER — Ambulatory Visit: Payer: Medicaid Other | Admitting: Nurse Practitioner

## 2023-06-06 ENCOUNTER — Ambulatory Visit: Payer: Medicaid Other | Admitting: Oncology

## 2023-06-07 ENCOUNTER — Telehealth: Payer: Self-pay

## 2023-06-07 NOTE — Telephone Encounter (Signed)
Miss Firkus contacted our office to inquire about their prescribed Oxycodone medication. The patient also reported experiencing swelling and pain in their left leg. I suggested that the patient come in for a Doppler examination of the affected leg, but they declined. The patient indicated that they would contact us if the swelling worsens. I advised the patient to apply ice and elevate their leg above heart level for relief.

## 2023-06-08 ENCOUNTER — Other Ambulatory Visit: Payer: Self-pay | Admitting: Nurse Practitioner

## 2023-06-08 DIAGNOSIS — C250 Malignant neoplasm of head of pancreas: Secondary | ICD-10-CM

## 2023-06-08 MED ORDER — OXYCODONE HCL 10 MG PO TABS
10.0000 mg | ORAL_TABLET | ORAL | 0 refills | Status: DC | PRN
Start: 2023-06-08 — End: 2023-06-23

## 2023-06-13 ENCOUNTER — Inpatient Hospital Stay (HOSPITAL_BASED_OUTPATIENT_CLINIC_OR_DEPARTMENT_OTHER): Payer: Medicaid Other | Admitting: Nurse Practitioner

## 2023-06-13 ENCOUNTER — Other Ambulatory Visit: Payer: Self-pay

## 2023-06-13 ENCOUNTER — Inpatient Hospital Stay: Payer: Medicaid Other

## 2023-06-13 ENCOUNTER — Encounter: Payer: Self-pay | Admitting: Nurse Practitioner

## 2023-06-13 ENCOUNTER — Inpatient Hospital Stay: Payer: Medicaid Other | Attending: Nurse Practitioner

## 2023-06-13 ENCOUNTER — Other Ambulatory Visit: Payer: Self-pay | Admitting: *Deleted

## 2023-06-13 VITALS — BP 140/90 | HR 79 | Temp 98.2°F | Resp 18 | Ht 64.0 in | Wt 205.0 lb

## 2023-06-13 DIAGNOSIS — D509 Iron deficiency anemia, unspecified: Secondary | ICD-10-CM | POA: Diagnosis not present

## 2023-06-13 DIAGNOSIS — C787 Secondary malignant neoplasm of liver and intrahepatic bile duct: Secondary | ICD-10-CM | POA: Diagnosis not present

## 2023-06-13 DIAGNOSIS — C259 Malignant neoplasm of pancreas, unspecified: Secondary | ICD-10-CM | POA: Diagnosis not present

## 2023-06-13 DIAGNOSIS — G43909 Migraine, unspecified, not intractable, without status migrainosus: Secondary | ICD-10-CM | POA: Insufficient documentation

## 2023-06-13 DIAGNOSIS — E039 Hypothyroidism, unspecified: Secondary | ICD-10-CM | POA: Diagnosis not present

## 2023-06-13 DIAGNOSIS — C250 Malignant neoplasm of head of pancreas: Secondary | ICD-10-CM

## 2023-06-13 DIAGNOSIS — Z95828 Presence of other vascular implants and grafts: Secondary | ICD-10-CM

## 2023-06-13 DIAGNOSIS — E46 Unspecified protein-calorie malnutrition: Secondary | ICD-10-CM | POA: Insufficient documentation

## 2023-06-13 DIAGNOSIS — I10 Essential (primary) hypertension: Secondary | ICD-10-CM | POA: Insufficient documentation

## 2023-06-13 DIAGNOSIS — J45909 Unspecified asthma, uncomplicated: Secondary | ICD-10-CM | POA: Diagnosis not present

## 2023-06-13 DIAGNOSIS — Z8673 Personal history of transient ischemic attack (TIA), and cerebral infarction without residual deficits: Secondary | ICD-10-CM | POA: Insufficient documentation

## 2023-06-13 DIAGNOSIS — E785 Hyperlipidemia, unspecified: Secondary | ICD-10-CM | POA: Diagnosis not present

## 2023-06-13 LAB — CBC WITH DIFFERENTIAL (CANCER CENTER ONLY)
Abs Immature Granulocytes: 0.03 10*3/uL (ref 0.00–0.07)
Basophils Absolute: 0 10*3/uL (ref 0.0–0.1)
Basophils Relative: 1 %
Eosinophils Absolute: 0.2 10*3/uL (ref 0.0–0.5)
Eosinophils Relative: 4 %
HCT: 32.9 % — ABNORMAL LOW (ref 36.0–46.0)
Hemoglobin: 10.8 g/dL — ABNORMAL LOW (ref 12.0–15.0)
Immature Granulocytes: 1 %
Lymphocytes Relative: 15 %
Lymphs Abs: 1 10*3/uL (ref 0.7–4.0)
MCH: 31.2 pg (ref 26.0–34.0)
MCHC: 32.8 g/dL (ref 30.0–36.0)
MCV: 95.1 fL (ref 80.0–100.0)
Monocytes Absolute: 0.5 10*3/uL (ref 0.1–1.0)
Monocytes Relative: 8 %
Neutro Abs: 4.8 10*3/uL (ref 1.7–7.7)
Neutrophils Relative %: 71 %
Platelet Count: 220 10*3/uL (ref 150–400)
RBC: 3.46 MIL/uL — ABNORMAL LOW (ref 3.87–5.11)
RDW: 14.6 % (ref 11.5–15.5)
WBC Count: 6.6 10*3/uL (ref 4.0–10.5)
nRBC: 0 % (ref 0.0–0.2)

## 2023-06-13 LAB — CMP (CANCER CENTER ONLY)
ALT: <5 U/L (ref 0–44)
AST: 10 U/L — ABNORMAL LOW (ref 15–41)
Albumin: 3.6 g/dL (ref 3.5–5.0)
Alkaline Phosphatase: 135 U/L — ABNORMAL HIGH (ref 38–126)
Anion gap: 8 (ref 5–15)
BUN: 11 mg/dL (ref 6–20)
CO2: 29 mmol/L (ref 22–32)
Calcium: 9.3 mg/dL (ref 8.9–10.3)
Chloride: 101 mmol/L (ref 98–111)
Creatinine: 0.96 mg/dL (ref 0.44–1.00)
GFR, Estimated: >60 mL/min (ref >60)
Glucose, Bld: 93 mg/dL (ref 70–99)
Potassium: 3.8 mmol/L (ref 3.5–5.1)
Sodium: 138 mmol/L (ref 135–145)
Total Bilirubin: 0.4 mg/dL (ref 0.3–1.2)
Total Protein: 6.4 g/dL — ABNORMAL LOW (ref 6.5–8.1)

## 2023-06-13 MED ORDER — HEPARIN SOD (PORK) LOCK FLUSH 100 UNIT/ML IV SOLN
500.0000 [IU] | Freq: Once | INTRAVENOUS | Status: AC
  Administered 2023-06-13: 500 [IU] via INTRAVENOUS

## 2023-06-13 MED ORDER — MORPHINE SULFATE ER 60 MG PO TBCR
60.0000 mg | EXTENDED_RELEASE_TABLET | Freq: Two times a day (BID) | ORAL | 0 refills | Status: DC
Start: 2023-06-13 — End: 2023-07-04

## 2023-06-13 MED ORDER — DEXAMETHASONE 2 MG PO TABS
10.0000 mg | ORAL_TABLET | ORAL | 2 refills | Status: DC
Start: 2023-06-13 — End: 2023-08-17

## 2023-06-13 MED ORDER — SODIUM CHLORIDE 0.9% FLUSH
10.0000 mL | INTRAVENOUS | Status: DC | PRN
  Administered 2023-06-13: 10 mL via INTRAVENOUS

## 2023-06-13 NOTE — Patient Instructions (Signed)

## 2023-06-13 NOTE — Progress Notes (Signed)
[facility] OFFICE PROGRESS NOTE   Diagnosis: Pancreas cancer  INTERVAL HISTORY:   Ms. Wendy Hoffman returns for follow-up.  She completed another cycle of FOLFIRI 05/25/2023.  She has multiple complaints at today's visit.  She has had a significant "toothache" and migraine for the past 3 days.  She has contacted her dentist and reports being scheduled for extraction of multiple teeth along the lower gumline later this week.  She denies fever.  No diarrhea.  Same upper abdominal pain but worse recently.  She recently noted an alteration in taste.  She continues to have intermittent edema of the left leg, significantly improved recently.  Objective:  Vital signs in last 24 hours:  Blood pressure (!) 140/90, pulse 79, temperature 98.2 F (36.8 C), temperature source Oral, resp. rate 18, height 5\' 4"  (1.626 m), weight 205 lb (93 kg), SpO2 98 %.    HEENT: Poor dentition with multiple missing, fractured teeth.  Tender at various areas along the left lower gumline. Resp: Lungs clear bilaterally. Cardio: Regular rate and rhythm. GI: Abdomen soft with tenderness across the upper abdomen, greatest tenderness is right upper to mid abdomen.  No hepatomegaly. Vascular: Trace edema left lower leg. Neuro: Alert and oriented. Port-A-Cath without erythema.  Lab Results:  Lab Results  Component Value Date   WBC 6.6 06/13/2023   HGB 10.8 (L) 06/13/2023   HCT 32.9 (L) 06/13/2023   MCV 95.1 06/13/2023   PLT 220 06/13/2023   NEUTROABS 4.8 06/13/2023    Imaging:  No results found.  Medications: I have reviewed the patient's current medications.  Assessment/Plan: Pancreas cancer -CT abdomen/pelvis without contrast 02/16/2021-multiple large hypoechoic masses in the patient's liver consistent metastatic disease, ill-defined masslike area in the pancreatic head measuring approximate 4.7 cm, possible filling defect in the right ventricle. -EGD 02/19/2021-large infiltrative mass with  bleeding in the second portion of the duodenum, appears to be arising near the ampulla, partially obstructive with friable mucosa.  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 -Flexible sigmoidoscopy 02/19/2021-diverticulosis in the sigmoid colon, nonbleeding external and internal hemorrhoids -Cardiac CT 02/20/2021-mass in the mid RV attached to the interventricular septum measuring 16 mm x 6 mm and concerning for metastatic tumor -MRI abdomen with/without contrast 02/21/2021-mass in the posterior pancreatic head measuring 4.3 x 3.8 cm with obstruction of the central pancreatic duct and diffuse dilatation up to 6 mm consistent with pancreatic adenocarcinoma, liver lesions the largest mass of the central left lobe measuring 9.9 x 9.2 cm consistent with hepatic metastatic disease,  hepatomegaly. -Ultrasound-guided biopsy of a left liver lesion 02/24/2021-adenocarcinoma, morphologically similar to the duodenal biopsy; microsatellite stable, tumor mutation burden 1 -Negative genetic testing -Palliative radiation to the duodenal mass 02/25/2021-03/06/2021 -Cycle 1 FOLFOX 03/17/2021 -Cycle 2 FOLFOX 04/01/2021 -Cycle 3 FOLFIRINOX 04/14/2021 -Cycle 4 FOLFIRINOX 04/27/2021, Udenyca added -Cycle 5 FOLFIRINOX 05/12/2021, Udenyca -MRI abdomen 05/23/2021-decrease in size of pancreas mass and hepatic lesions, no progressive disease -Cycle 6 FOLFIRINOX 05/27/2021 -CT 06/17/2021-multiple liver metastases similar to her prior exam.  Pancreas mass not significantly changed. -CT 06/26/2021-stable pancreas mass, no significant change in size and number of known liver metastases. -CT abdomen/pelvis 07/12/2021-new gastrostomy tube.  Multiple liver masses slightly decreased in size from prior exam.  Pancreas head mass unchanged. -Cycle 1 gemcitabine/Abraxane 07/30/2021 -Cycle 2 gemcitabine/Abraxane 08/13/2021 -Cycle 3 gemcitabine 08/27/2021, Abraxane held due to significant bilateral toe pain, question  neuropathy -Cycle 4 gemcitabine, Abraxane held due to neuropathy, 09/10/2021 -Cycle 5 gemcitabine, Abraxane held due to neuropathy, 09/24/2021 -Cycle 6 gemcitabine,  Abraxane held due to neuropathy 10/16/2021 -CTs 10/29/2021-mild progression of multifocal liver metastases; mild increase in size of hypoenhancing mass within the head of the pancreas; new mild pleural nodularity along the posterior lower lobes -Cycle 7 gemcitabine/Abraxane 11/03/2021 -Cycle 8 gemcitabine/Abraxane 11/23/2021 -Cycle 9 gemcitabine/Abraxane 12/10/2021 -Cycle 10 Gemcitabine/Abraxane 12/24/2021 -CT abdomen/pelvis 01/04/2022-decrease size of pancreas head mass, no change in multifocal hepatic metastases -Cycle 11 gemcitabine/Abraxane 01/07/2022 -Cycle 12 gemcitabine/Abraxane 02/04/2022 -Cycle 13 Gemcitabine/Abraxane 02/18/2022 -Cycle 14 gemcitabine/Abraxane 03/11/2022 -Cycle 15 gemcitabine/Abraxane 03/29/2022 -Cycle 16 gemcitabine/Abraxane 04/16/2022 -CT abdomen/pelvis 04/22/2022-mixed response in the liver, some lesions larger, some smaller, no change in pancreas head mass, no evidence of metastatic disease elsewhere in the abdomen or pelvis -Cycle 17 gemcitabine/Abraxane 05/17/2022 -Cycle 18 Gemcitabine/Abraxane 06/01/2022 -Cycle 19 gemcitabine/Abraxane 06/18/2022 -Cycle 20 gemcitabine/Abraxane 07/05/2022 -Cycle 21 gemcitabine/Abraxane 08/09/2022 -Cycle 22 gemcitabine/Abraxane 08/23/2022 -CT abdomen/pelvis 09/01/2022-stable liver metastases, difficult to visualize the pancreas head mass, no evidence of disease progression -Cycle 23 gemcitabine/Abraxane 09/06/2022 -Cycle 24 gemcitabine/Abraxane 09/20/2022 -Cycle 25 gemcitabine/Abraxane 10/18/2022 -Cycle 26 gemcitabine/Abraxane 11/01/2022 -Cycle 27 gemcitabine/Abraxane 11/29/2022 -CT abdomen/pelvis 12/14/2022-increased size of liver metastases, less well-defined pancreas mass, 4 mm right lung nodule -Cycle 1 FOLFIRINOX 01/10/2023 -Cycle 2 FOLFIRINOX 01/24/2023, 5-FU dose reduced secondary to  mucositis, chemotherapy plan dose reduce for weight loss -Develop pruritus during the oxaliplatin infusion, initial improvement with Benadryl and Solu-Medrol, oxaliplatin was resumed and the pruritus recurred.  Oxaliplatin was discontinued. -Chemotherapy held 02/07/2023 due to diarrhea, nausea/vomiting, dehydration -Cycle 3 FOLFIRINOX 03/07/2023 -Cycle 4 FOLFIRINOX 03/22/2023, 5-FU dose reduction due to mucositis, chemotherapy plan dose reduction for weight loss -CTs 03/30/2021-numerous bulky liver lesions, majority are slightly diminished in size though some lesions are unchanged or slightly enlarged. -Cycle 5 FOLFIRINOX 04/05/2023 -04/05/2023 reaction to oxaliplatin with dyspnea and pruritus -FOLFIRI every 3 weeks beginning 04/27/2023 -FOLFIRI 05/25/2023 2.  Iron deficiency anemia-improved -Feraheme 510 mg 02/20/2021 3.  Protein calorie malnutrition 4.  Possible right ventricular filling defect on noncontrast CT scan MRI cardiac morphology 02/21/2021-mass in the mid RV attached to the interventricular septum measures 16 mm x 6 mm.  Mass appears heterogeneous with area of enhancement on LGE imaging.  Mass is concerning for metastatic tumor.  Normal LV size and systolic function.  Normal RV size and systolic function. 5.  Hypertension 6.  History of CVA 7.  Hyperlipidemia 8.  Hypothyroidism 9.  Morbid obesity 10.  Asthma 11.  Migraines 12.  Pain secondary to #1 13.  Port-A-Cath placement 02/25/2021 14.  Hypokalemia 04/01/2021-started a potassium supplement 15.  Hospital admission 06/17/2021-intractable nausea and vomiting EGD 06/19/2021-diffuse gastritis, nonobstructing duodenal mass, biopsy of stomach-no malignancy or H. pylori, duodenal mass-adenocarcinoma 16.  Hospital admission 07/12/2021 with nausea/vomiting, abdominal pain, and hypokalemia 17.  Percutaneous gastrostomy tube placement 07/01/2021 18.  07/21/2022 bilateral lower extremity venous Doppler study-negative for DVT.    Disposition: Ms.  Guyett appears unchanged.  She is currently on active treatment with FOLFIRI every 3 weeks.  No clinical evidence of disease progression.  She is having significant dental issues and reports multiple teeth are going to be extracted later this week.  We decided to hold today's treatment and regroup next week.  She agrees with this plan.  She will return for follow-up in 1 week.    Lonna Cobb ANP/GNP-BC   06/13/2023  8:55 AM

## 2023-06-14 ENCOUNTER — Other Ambulatory Visit: Payer: Self-pay | Admitting: Family

## 2023-06-14 ENCOUNTER — Encounter: Payer: Self-pay | Admitting: Family

## 2023-06-14 ENCOUNTER — Ambulatory Visit: Payer: Medicaid Other | Admitting: Family

## 2023-06-14 VITALS — BP 134/81 | HR 98 | Ht 64.0 in | Wt 202.8 lb

## 2023-06-14 DIAGNOSIS — F418 Other specified anxiety disorders: Secondary | ICD-10-CM

## 2023-06-14 DIAGNOSIS — R600 Localized edema: Secondary | ICD-10-CM

## 2023-06-14 LAB — CANCER ANTIGEN 19-9: CA 19-9: 9241 U/mL — ABNORMAL HIGH (ref 0–35)

## 2023-06-14 MED ORDER — POTASSIUM CHLORIDE CRYS ER 10 MEQ PO TBCR: EXTENDED_RELEASE_TABLET | ORAL | 0 refills | Status: DC

## 2023-06-14 MED ORDER — FUROSEMIDE 20 MG PO TABS: 20.0000 mg | ORAL_TABLET | Freq: Two times a day (BID) | ORAL | 1 refills | Status: DC | PRN

## 2023-06-14 NOTE — Progress Notes (Signed)
Wendy Hoffman is a 57 y.o. 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 06/18/2021   Hypokalemia 06/18/2021   Malnutrition of moderate degree 06/18/2021   Cancer (HCC) 06/17/2021   Cervical ca (HCC) 06/17/2021   Nausea and vomiting 06/17/2021   Genetic testing 03/23/2021   Port-A-Cath in place 03/16/2021   Goals of care, counseling/discussion 03/06/2021   Morbidly obese (HCC) 02/27/2021   Heme positive stool    Iron deficiency anemia due to chronic blood loss    Epigastric pain    Malignant neoplasm of head of pancreas (HCC) 02/17/2021   Aortic atherosclerosis (HCC) 02/17/2021   GI bleeding 02/16/2021   Class 3 obesity with current BMI at 44.30 kg/m 02/16/2021   COPD (chronic obstructive pulmonary disease) (HCC) 02/16/2021   Symptomatic anemia    Pancreatic cancer (HCC) 02/2021   Snoring 05/03/2016   Morbid obesity (HCC) 04/25/2014   Asthma exacerbation 04/22/2014   Asthma attack 04/22/2014   Asthma with acute exacerbation 11/03/2013   Hypothyroidism 11/03/2013   Tobacco abuse 11/03/2013   Migraine 09/22/2012   Weakness of right side of body 09/22/2012   Thyroid disease    Hypertension    High cholesterol    H/O: hysterectomy    Asthma 11/26/2008   HEADACHE, CHRONIC 11/26/2008    Current Outpatient Medications  Medication Sig Dispense Refill   albuterol (VENTOLIN HFA) 108 (90 Base) MCG/ACT inhaler INHALE 1 TO 2 PUFFS BY MOUTH EVERY 6 HOURS AS NEEDED FOR WHEEZING AND FOR SHORTNESS OF BREATH 54 g 1   bisacodyl (DULCOLAX) 5 MG EC tablet Take 2 tablets (10 mg total) by mouth daily as needed for moderate constipation. 30 tablet 2   dexamethasone (DECADRON) 2 MG tablet Take 5  tablets (10 mg total) by mouth as directed. Take 5 tabs (10mg ) at 10pm night prior to each chemotherapy-every 2 weeks 30 tablet 2   DULoxetine (CYMBALTA) 30 MG capsule Take 2 capsules (60 mg total) by mouth daily. 180 capsule 2   fluconazole (DIFLUCAN) 100 MG tablet Take 1 tablet (100 mg total) by mouth daily. X 4 days 4 tablet 0   gabapentin (NEURONTIN) 300 MG capsule Take 1 capsule (300 mg total) by mouth at bedtime. 90 capsule 0   lidocaine-prilocaine (EMLA) cream Apply 1 tablespoon to port site 2 hours prior to stick to numb site. 30 g 2   magic mouthwash (nystatin, diphenhydrAMINE, alum & mag hydroxide) suspension mixture Swish 5 mL for 1 minute and spit four times daily as needed for mouth pain. 240 mL 1   morphine (MS CONTIN) 60 MG 12 hr tablet Take 1 tablet (60 mg total) by mouth every 12 (twelve) hours. 60 tablet 0   Morphine Sulfate (MORPHINE CONCENTRATE) 10 MG/0.5ML SOLN concentrated solution Place 1-1.5 mLs (20-30 mg total) under the tongue every 8 (eight) hours as needed for severe pain (pain not relieved with oxycodone). 30 mL 0   ondansetron (ZOFRAN) 8 MG tablet Take 1 tablet (8 mg total) by mouth every 8 (eight) hours as needed for nausea or vomiting. 90 tablet 2   ondansetron (ZOFRAN-ODT) 8 MG disintegrating tablet Take 1 tablet (8 mg total) by mouth every 8 (eight) hours as needed for nausea or vomiting.  90 tablet 2   Oxycodone HCl 10 MG TABS Take 1-2 tablets (10-20 mg total) by mouth every 4 (four) hours as needed (Pain). 60 tablet 0   pantoprazole (PROTONIX) 40 MG tablet Take 1 tablet (40 mg total) by mouth 2 (two) times daily. 180 tablet 2   predniSONE (DELTASONE) 10 MG tablet Take 1 tablet (10 mg total) by mouth daily with breakfast. 30 tablet 1   prochlorperazine (COMPAZINE) 10 MG tablet TAKE 1 TABLET(10 MG) BY MOUTH EVERY 6 HOURS AS NEEDED FOR NAUSEA OR VOMITING 180 tablet 2   scopolamine (TRANSDERM-SCOP) 1 MG/3DAYS Place 1 patch (1.5 mg total) onto the skin every 3 (three)  days. 30 patch 2   SYMBICORT 160-4.5 MCG/ACT inhaler Inhale 2 puffs into the lungs 2 (two) times daily. 30.6 g 2   zolpidem (AMBIEN) 10 MG tablet Take 1 tablet (10 mg total) by mouth at bedtime as needed for sleep. 30 tablet 2   furosemide (LASIX) 20 MG tablet Take 1 tablet (20 mg total) by mouth 2 (two) times daily as needed for edema. 60 tablet 1   potassium chloride (KLOR-CON M) 10 MEQ tablet TAKE 1 TABLET BY MOUTH DAILY AS DIRECTED WHEN USING FUROSEMIDE 90 tablet 0   No current facility-administered medications for this visit.    Allergies: Blueberry [vaccinium angustifolium], Mango flavor, Oxaliplatin, Reglan [metoclopramide], Aspirin, and Penicillins  Past Medical History:  Diagnosis Date   Anemia    Anxiety    ARF (acute renal failure) (HCC) 06/18/2021   Arthritis    Asthma    Cervical ca (HCC)    H/O: hysterectomy    High cholesterol    Hypertension    Hypothyroidism    Insomnia    Medical history non-contributory    Migraine    Morbid obesity (HCC)    Pancreatic cancer (HCC) 02/2021   Shortness of breath    Stroke [facility])    Thyroid disease     Past Surgical History:  Procedure Laterality Date   ABDOMINAL HYSTERECTOMY     BIOPSY  02/19/2021   Procedure: BIOPSY;  Surgeon: Napoleon Form, MD;  Location: [facility];  Service: Endoscopy;;   BIOPSY  06/19/2021   Procedure: BIOPSY;  Surgeon: Kathi Der, MD;  Location: [facility];  Service: Gastroenterology;;   CESAREAN SECTION     3 occasions   CHOLECYSTECTOMY     ESOPHAGOGASTRODUODENOSCOPY (EGD) WITH PROPOFOL N/A 02/19/2021   Procedure: ESOPHAGOGASTRODUODENOSCOPY (EGD) WITH PROPOFOL;  Surgeon: Napoleon Form, MD;  Location: [facility];  Service: Endoscopy;  Laterality: N/A;   ESOPHAGOGASTRODUODENOSCOPY (EGD) WITH PROPOFOL N/A 06/19/2021   Procedure: ESOPHAGOGASTRODUODENOSCOPY (EGD) WITH PROPOFOL;  Surgeon: Kathi Der, MD;  Location: [facility];  Service: Gastroenterology;  Laterality: N/A;    FLEXIBLE SIGMOIDOSCOPY N/A 02/19/2021   Procedure: FLEXIBLE SIGMOIDOSCOPY;  Surgeon: Napoleon Form, MD;  Location: [facility];  Service: Endoscopy;  Laterality: N/A;   IR GASTROSTOMY TUBE MOD SED  07/01/2021   IR GASTROSTOMY TUBE REMOVAL  08/21/2021   IR IMAGING GUIDED PORT INSERTION  02/24/2021   IR US GUIDE BX ASP/DRAIN  02/24/2021   THYROIDECTOMY, PARTIAL     Goiter    Family History  Problem Relation Age of Onset   Migraines Sister    Non-Hodgkin's lymphoma Other        maternal half brother's grandson   Non-Hodgkin's lymphoma Half-Brother        maternal half brother; dx 30s or 11s   Pancreatic cancer Other 31  MGF's niece    Social History   Tobacco Use   Smoking status: Former    Types: Cigarettes   Smokeless tobacco: Never  Substance Use Topics   Alcohol use: No    Subjective:   Patient had been having problems with intermittent swelling in her left leg since early May; has been evaluated by hematology and felt to be related to cancer; she has been using Lasix as needed for the symptoms and noticed improvement in the past few days;  Does note that she would like to speak with mental health provider to help her talk through depression/ anxiety;   Will be having extensive dental procedure done tomorrow- having all of her bottom teeth removed tomorrow/ will be getting dentures fitted;   Objective:  Vitals:   06/14/23 1302  BP: 134/81  Pulse: 98  SpO2: 99%  Weight: 202 lb 12.8 oz (92 kg)  Height: 5\' 4"  (1.626 m)    General: Well developed, well nourished, in no acute distress  Skin : Warm and dry.  Head: Normocephalic and atraumatic  Eyes: Sclera and conjunctiva clear; pupils round and reactive to light; extraocular movements intact  Ears: External normal; canals clear; tympanic membranes normal  Oropharynx: Pink, supple. No suspicious lesions  Neck: Supple without thyromegaly, adenopathy  Lungs: Respirations unlabored; clear to auscultation bilaterally  without wheeze, rales, rhonchi  CVS exam: normal rate and regular rhythm.  Neurologic: Alert and oriented; speech intact; face symmetrical; moves all extremities well; CNII-XII intact without focal deficit   Assessment:  1. Pedal edema   2. Situational anxiety     Plan:  Okay to use Lasix prn; refill updated; physical exam is reassuring today; Will update referral to behavioral health; will also reach out to her oncology provider to see if cancer center has support system for patients. I did hear back from Lonna Cobb and she will be having the social worker at the cancer center reach out to the patient.   No follow-ups on file.  Orders Placed This Encounter  Procedures   Ambulatory referral to Behavioral Health    Referral Priority:   Routine    Referral Type:   Psychiatric    Referral Reason:   Specialty Services Required    Requested Specialty:   Behavioral Health    Number of Visits Requested:   1    Requested Prescriptions   Signed Prescriptions Disp Refills   furosemide (LASIX) 20 MG tablet 60 tablet 1    Sig: Take 1 tablet (20 mg total) by mouth 2 (two) times daily as needed for edema.

## 2023-06-15 ENCOUNTER — Inpatient Hospital Stay: Payer: Medicaid Other | Admitting: Nutrition

## 2023-06-15 ENCOUNTER — Inpatient Hospital Stay: Payer: Medicaid Other

## 2023-06-15 ENCOUNTER — Telehealth: Payer: Self-pay

## 2023-06-15 ENCOUNTER — Inpatient Hospital Stay: Payer: Medicaid Other | Admitting: Nurse Practitioner

## 2023-06-15 ENCOUNTER — Telehealth: Payer: Self-pay | Admitting: *Deleted

## 2023-06-15 DIAGNOSIS — C259 Malignant neoplasm of pancreas, unspecified: Secondary | ICD-10-CM

## 2023-06-15 DIAGNOSIS — C250 Malignant neoplasm of head of pancreas: Secondary | ICD-10-CM

## 2023-06-15 MED ORDER — FLUCONAZOLE 100 MG PO TABS
100.0000 mg | ORAL_TABLET | Freq: Every day | ORAL | 0 refills | Status: DC
Start: 2023-06-15 — End: 2023-07-14

## 2023-06-15 NOTE — Telephone Encounter (Signed)
Reports she has thrush again and wants script sent to [facility]. OK for fluconazole 100 mg every day x 4 days per Dr. Truett Perna

## 2023-06-15 NOTE — Telephone Encounter (Signed)
CSW attempted to contact patient per the request of Lonna Cobb to address behavioral health needs.  Left vm.

## 2023-06-17 ENCOUNTER — Inpatient Hospital Stay: Payer: Medicaid Other

## 2023-06-19 ENCOUNTER — Other Ambulatory Visit: Payer: Self-pay | Admitting: Oncology

## 2023-06-19 DIAGNOSIS — C250 Malignant neoplasm of head of pancreas: Secondary | ICD-10-CM

## 2023-06-20 ENCOUNTER — Inpatient Hospital Stay: Payer: Medicaid Other

## 2023-06-20 ENCOUNTER — Inpatient Hospital Stay: Payer: Medicaid Other | Admitting: Nurse Practitioner

## 2023-06-22 ENCOUNTER — Inpatient Hospital Stay: Payer: Medicaid Other

## 2023-06-23 ENCOUNTER — Ambulatory Visit: Payer: Medicaid Other | Admitting: Nurse Practitioner

## 2023-06-23 ENCOUNTER — Other Ambulatory Visit: Payer: Self-pay | Admitting: Nurse Practitioner

## 2023-06-23 ENCOUNTER — Other Ambulatory Visit: Payer: Medicaid Other

## 2023-06-23 ENCOUNTER — Ambulatory Visit: Payer: Medicaid Other

## 2023-06-23 ENCOUNTER — Telehealth: Payer: Self-pay | Admitting: *Deleted

## 2023-06-23 DIAGNOSIS — C250 Malignant neoplasm of head of pancreas: Secondary | ICD-10-CM

## 2023-06-23 MED ORDER — OXYCODONE HCL 10 MG PO TABS
10.0000 mg | ORAL_TABLET | ORAL | 0 refills | Status: DC | PRN
Start: 2023-06-23 — End: 2023-07-04

## 2023-06-23 NOTE — Telephone Encounter (Signed)
Per Lonna Cobb, NP: Push po fluids. Pain med will be refilled today. Call if no longer able to keep fluids down or if she develops fever. We can work her in w/labs tomorrow if she wishes. Attempted to reach patient w/this information and she did not answer. Left detailed voice mail.

## 2023-06-23 NOTE — Telephone Encounter (Signed)
Wendy Hoffman left VM reporting GI issues. Returned call and she reports nausea/vomiting for past 2 days-food does not stay down. The only thing that she does not vomit is water and her medications. She is taking her anti-emetics. Also reports increase in pain in upper abdomen that radiates to her back. Urine look dark, like tea and she is not voiding as much in quantity. Denies fever and does not think she looks jaundiced. Has been using more of her pain meds and is currently out of her oxycodone 10 tabs and is using her liquid morphine sulfate. Went to dentist today and was told she as an infection and was put on clindamycin-just took her 1st dose this afternoon.  Offered to bring her in tomorrow for IVF and she declines saying she does not think she is dehydrated.

## 2023-06-29 ENCOUNTER — Encounter: Payer: Self-pay | Admitting: *Deleted

## 2023-06-29 NOTE — Progress Notes (Signed)
Received fax from [facility] mail order pharmacy for refill on zolpidem. Faxed back note that Dr. Truett Perna does not send mail order scripts for narcotics (3rd time)

## 2023-07-04 ENCOUNTER — Ambulatory Visit (HOSPITAL_BASED_OUTPATIENT_CLINIC_OR_DEPARTMENT_OTHER): Payer: Medicaid Other

## 2023-07-04 ENCOUNTER — Inpatient Hospital Stay: Payer: Medicaid Other

## 2023-07-04 ENCOUNTER — Encounter: Payer: Self-pay | Admitting: *Deleted

## 2023-07-04 ENCOUNTER — Other Ambulatory Visit: Payer: Self-pay | Admitting: *Deleted

## 2023-07-04 ENCOUNTER — Other Ambulatory Visit (HOSPITAL_BASED_OUTPATIENT_CLINIC_OR_DEPARTMENT_OTHER): Payer: Self-pay

## 2023-07-04 ENCOUNTER — Ambulatory Visit (HOSPITAL_BASED_OUTPATIENT_CLINIC_OR_DEPARTMENT_OTHER)
Admission: RE | Admit: 2023-07-04 | Discharge: 2023-07-04 | Disposition: A | Discharge status: Home / Self Care | Payer: Medicaid Other | Source: Ambulatory Visit | Attending: Nurse Practitioner | Admitting: Nurse Practitioner

## 2023-07-04 ENCOUNTER — Telehealth: Payer: Self-pay

## 2023-07-04 ENCOUNTER — Inpatient Hospital Stay (HOSPITAL_BASED_OUTPATIENT_CLINIC_OR_DEPARTMENT_OTHER): Payer: Medicaid Other | Admitting: Nurse Practitioner

## 2023-07-04 ENCOUNTER — Other Ambulatory Visit: Payer: Self-pay

## 2023-07-04 ENCOUNTER — Encounter: Payer: Self-pay | Admitting: Nurse Practitioner

## 2023-07-04 VITALS — BP 102/70 | HR 91 | Temp 98.1°F | Resp 20 | Ht 64.0 in | Wt 191.4 lb

## 2023-07-04 DIAGNOSIS — C250 Malignant neoplasm of head of pancreas: Secondary | ICD-10-CM

## 2023-07-04 DIAGNOSIS — C259 Malignant neoplasm of pancreas, unspecified: Secondary | ICD-10-CM

## 2023-07-04 LAB — CBC WITH DIFFERENTIAL (CANCER CENTER ONLY)
Abs Immature Granulocytes: 0.02 10*3/uL (ref 0.00–0.07)
Basophils Absolute: 0 10*3/uL (ref 0.0–0.1)
Basophils Relative: 1 %
Eosinophils Absolute: 0.2 10*3/uL (ref 0.0–0.5)
Eosinophils Relative: 4 %
HCT: 33.6 % — ABNORMAL LOW (ref 36.0–46.0)
Hemoglobin: 11 g/dL — ABNORMAL LOW (ref 12.0–15.0)
Immature Granulocytes: 0 %
Lymphocytes Relative: 21 %
Lymphs Abs: 1.3 10*3/uL (ref 0.7–4.0)
MCH: 30.4 pg (ref 26.0–34.0)
MCHC: 32.7 g/dL (ref 30.0–36.0)
MCV: 92.8 fL (ref 80.0–100.0)
Monocytes Absolute: 0.5 10*3/uL (ref 0.1–1.0)
Monocytes Relative: 9 %
Neutro Abs: 4.1 10*3/uL (ref 1.7–7.7)
Neutrophils Relative %: 65 %
Platelet Count: 365 10*3/uL (ref 150–400)
RBC: 3.62 MIL/uL — ABNORMAL LOW (ref 3.87–5.11)
RDW: 14.7 % (ref 11.5–15.5)
WBC Count: 6.2 10*3/uL (ref 4.0–10.5)
nRBC: 0 % (ref 0.0–0.2)

## 2023-07-04 LAB — CMP (CANCER CENTER ONLY)
ALT: 7 U/L (ref 0–44)
AST: 12 U/L — ABNORMAL LOW (ref 15–41)
Albumin: 3.7 g/dL (ref 3.5–5.0)
Alkaline Phosphatase: 138 U/L — ABNORMAL HIGH (ref 38–126)
Anion gap: 9 (ref 5–15)
BUN: 14 mg/dL (ref 6–20)
CO2: 33 mmol/L — ABNORMAL HIGH (ref 22–32)
Calcium: 9.7 mg/dL (ref 8.9–10.3)
Chloride: 96 mmol/L — ABNORMAL LOW (ref 98–111)
Creatinine: 1.58 mg/dL — ABNORMAL HIGH (ref 0.44–1.00)
GFR, Estimated: 38 mL/min — ABNORMAL LOW (ref >60)
Glucose, Bld: 96 mg/dL (ref 70–99)
Potassium: 3.6 mmol/L (ref 3.5–5.1)
Sodium: 138 mmol/L (ref 135–145)
Total Bilirubin: 0.4 mg/dL (ref 0.3–1.2)
Total Protein: 7.2 g/dL (ref 6.5–8.1)

## 2023-07-04 MED ORDER — HEPARIN SOD (PORK) LOCK FLUSH 100 UNIT/ML IV SOLN
500.0000 [IU] | Freq: Once | INTRAVENOUS | Status: AC
  Administered 2023-07-04: 500 [IU] via INTRAVENOUS

## 2023-07-04 MED ORDER — SODIUM CHLORIDE 0.9 % IV SOLN: INTRAVENOUS | Status: DC

## 2023-07-04 MED ORDER — SODIUM CHLORIDE 0.9% FLUSH
10.0000 mL | Freq: Once | INTRAVENOUS | Status: AC
  Administered 2023-07-04: 10 mL via INTRAVENOUS

## 2023-07-04 MED ORDER — OXYCODONE HCL 10 MG PO TABS
10.0000 mg | ORAL_TABLET | ORAL | 0 refills | Status: DC | PRN
Start: 2023-07-04 — End: 2023-07-14
  Filled 2023-07-04: qty 60, 5d supply, fill #0

## 2023-07-04 MED ORDER — MORPHINE SULFATE ER 60 MG PO TBCR
60.0000 mg | EXTENDED_RELEASE_TABLET | Freq: Two times a day (BID) | ORAL | 0 refills | Status: DC
Start: 2023-07-04 — End: 2023-08-09
  Filled 2023-07-04: qty 60, 30d supply, fill #0

## 2023-07-04 NOTE — Addendum Note (Signed)
Addended by: Lonna Cobb K on: 07/04/2023 11:00 AM   Modules accepted: Orders

## 2023-07-04 NOTE — Telephone Encounter (Signed)
Patient gave verbal understanding and had no further questions or concerns  

## 2023-07-04 NOTE — Patient Instructions (Signed)

## 2023-07-04 NOTE — Patient Instructions (Signed)

## 2023-07-04 NOTE — Progress Notes (Signed)
Patient will not be treated. Today. Will have CT scan today and return on 7/24. Scheduler is aware.

## 2023-07-04 NOTE — Progress Notes (Addendum)
[facility] OFFICE PROGRESS NOTE   Diagnosis: Pancreas cancer  INTERVAL HISTORY:   Ms. Bower returns for follow-up.  She completed a cycle of FOLFIRI 05/25/2023.  She was last seen 06/13/2023.  Treatment was held due to dental issues and planned extraction of multiple teeth.  She missed multiple subsequent follow-up visits.  She reports worsening abdominal pain over the past several weeks.  She is taking oxycodone and liquid morphine with temporary relief.  She had a bowel movement yesterday after an enema.  She has intermittent nausea.  She reports she is completing an antibiotic for a "jaw infection".  She has not had dental extractions.  Objective:  Vital signs in last 24 hours:  Blood pressure 98/62, pulse 91, temperature 98.1 F (36.7 C), temperature source Oral, resp. rate 20, height 5\' 4"  (1.626 m), weight 191 lb 6.4 oz (86.8 kg), SpO2 98%.    HEENT: No thrush or ulcers. Resp: Lungs clear bilaterally. Cardio: Regular rate and rhythm. GI: Generalized tenderness.  Full over the upper abdomen. Vascular: No leg edema. Neuro: Alert and oriented. Skin: Palms without erythema. Port-A-Cath without erythema.   Lab Results:  Lab Results  Component Value Date   WBC 6.2 07/04/2023   HGB 11.0 (L) 07/04/2023   HCT 33.6 (L) 07/04/2023   MCV 92.8 07/04/2023   PLT 365 07/04/2023   NEUTROABS 4.1 07/04/2023    Imaging:  No results found.  Medications: I have reviewed the patient's current medications.  Assessment/Plan: Pancreas cancer -CT abdomen/pelvis without contrast 02/16/2021-multiple large hypoechoic masses in the patient's liver consistent metastatic disease, ill-defined masslike area in the pancreatic head measuring approximate 4.7 cm, possible filling defect in the right ventricle. -EGD 02/19/2021-large infiltrative mass with bleeding in the second portion of the duodenum, appears to be arising near the ampulla, partially obstructive with friable mucosa.   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 -Flexible sigmoidoscopy 02/19/2021-diverticulosis in the sigmoid colon, nonbleeding external and internal hemorrhoids -Cardiac CT 02/20/2021-mass in the mid RV attached to the interventricular septum measuring 16 mm x 6 mm and concerning for metastatic tumor -MRI abdomen with/without contrast 02/21/2021-mass in the posterior pancreatic head measuring 4.3 x 3.8 cm with obstruction of the central pancreatic duct and diffuse dilatation up to 6 mm consistent with pancreatic adenocarcinoma, liver lesions the largest mass of the central left lobe measuring 9.9 x 9.2 cm consistent with hepatic metastatic disease,  hepatomegaly. -Ultrasound-guided biopsy of a left liver lesion 02/24/2021-adenocarcinoma, morphologically similar to the duodenal biopsy; microsatellite stable, tumor mutation burden 1 -Negative genetic testing -Palliative radiation to the duodenal mass 02/25/2021-03/06/2021 -Cycle 1 FOLFOX 03/17/2021 -Cycle 2 FOLFOX 04/01/2021 -Cycle 3 FOLFIRINOX 04/14/2021 -Cycle 4 FOLFIRINOX 04/27/2021, Udenyca added -Cycle 5 FOLFIRINOX 05/12/2021, Udenyca -MRI abdomen 05/23/2021-decrease in size of pancreas mass and hepatic lesions, no progressive disease -Cycle 6 FOLFIRINOX 05/27/2021 -CT 06/17/2021-multiple liver metastases similar to her prior exam.  Pancreas mass not significantly changed. -CT 06/26/2021-stable pancreas mass, no significant change in size and number of known liver metastases. -CT abdomen/pelvis 07/12/2021-new gastrostomy tube.  Multiple liver masses slightly decreased in size from prior exam.  Pancreas head mass unchanged. -Cycle 1 gemcitabine/Abraxane 07/30/2021 -Cycle 2 gemcitabine/Abraxane 08/13/2021 -Cycle 3 gemcitabine 08/27/2021, Abraxane held due to significant bilateral toe pain, question neuropathy -Cycle 4 gemcitabine, Abraxane held due to neuropathy, 09/10/2021 -Cycle 5 gemcitabine, Abraxane held due to neuropathy,  09/24/2021 -Cycle 6 gemcitabine, Abraxane held due to neuropathy 10/16/2021 -CTs 10/29/2021-mild progression of multifocal liver metastases; mild increase in size of hypoenhancing  mass within the head of the pancreas; new mild pleural nodularity along the posterior lower lobes -Cycle 7 gemcitabine/Abraxane 11/03/2021 -Cycle 8 gemcitabine/Abraxane 11/23/2021 -Cycle 9 gemcitabine/Abraxane 12/10/2021 -Cycle 10 Gemcitabine/Abraxane 12/24/2021 -CT abdomen/pelvis 01/04/2022-decrease size of pancreas head mass, no change in multifocal hepatic metastases -Cycle 11 gemcitabine/Abraxane 01/07/2022 -Cycle 12 gemcitabine/Abraxane 02/04/2022 -Cycle 13 Gemcitabine/Abraxane 02/18/2022 -Cycle 14 gemcitabine/Abraxane 03/11/2022 -Cycle 15 gemcitabine/Abraxane 03/29/2022 -Cycle 16 gemcitabine/Abraxane 04/16/2022 -CT abdomen/pelvis 04/22/2022-mixed response in the liver, some lesions larger, some smaller, no change in pancreas head mass, no evidence of metastatic disease elsewhere in the abdomen or pelvis -Cycle 17 gemcitabine/Abraxane 05/17/2022 -Cycle 18 Gemcitabine/Abraxane 06/01/2022 -Cycle 19 gemcitabine/Abraxane 06/18/2022 -Cycle 20 gemcitabine/Abraxane 07/05/2022 -Cycle 21 gemcitabine/Abraxane 08/09/2022 -Cycle 22 gemcitabine/Abraxane 08/23/2022 -CT abdomen/pelvis 09/01/2022-stable liver metastases, difficult to visualize the pancreas head mass, no evidence of disease progression -Cycle 23 gemcitabine/Abraxane 09/06/2022 -Cycle 24 gemcitabine/Abraxane 09/20/2022 -Cycle 25 gemcitabine/Abraxane 10/18/2022 -Cycle 26 gemcitabine/Abraxane 11/01/2022 -Cycle 27 gemcitabine/Abraxane 11/29/2022 -CT abdomen/pelvis 12/14/2022-increased size of liver metastases, less well-defined pancreas mass, 4 mm right lung nodule -Cycle 1 FOLFIRINOX 01/10/2023 -Cycle 2 FOLFIRINOX 01/24/2023, 5-FU dose reduced secondary to mucositis, chemotherapy plan dose reduce for weight loss -Develop pruritus during the oxaliplatin infusion, initial improvement  with Benadryl and Solu-Medrol, oxaliplatin was resumed and the pruritus recurred.  Oxaliplatin was discontinued. -Chemotherapy held 02/07/2023 due to diarrhea, nausea/vomiting, dehydration -Cycle 3 FOLFIRINOX 03/07/2023 -Cycle 4 FOLFIRINOX 03/22/2023, 5-FU dose reduction due to mucositis, chemotherapy plan dose reduction for weight loss -CTs 03/30/2021-numerous bulky liver lesions, majority are slightly diminished in size though some lesions are unchanged or slightly enlarged. -Cycle 5 FOLFIRINOX 04/05/2023 -04/05/2023 reaction to oxaliplatin with dyspnea and pruritus -FOLFIRI every 3 weeks beginning 04/27/2023 -FOLFIRI 05/25/2023 2.  Iron deficiency anemia-improved -Feraheme 510 mg 02/20/2021 3.  Protein calorie malnutrition 4.  Possible right ventricular filling defect on noncontrast CT scan MRI cardiac morphology 02/21/2021-mass in the mid RV attached to the interventricular septum measures 16 mm x 6 mm.  Mass appears heterogeneous with area of enhancement on LGE imaging.  Mass is concerning for metastatic tumor.  Normal LV size and systolic function.  Normal RV size and systolic function. 5.  Hypertension 6.  History of CVA 7.  Hyperlipidemia 8.  Hypothyroidism 9.  Morbid obesity 10.  Asthma 11.  Migraines 12.  Pain secondary to #1 13.  Port-A-Cath placement 02/25/2021 14.  Hypokalemia 04/01/2021-started a potassium supplement 15.  Hospital admission 06/17/2021-intractable nausea and vomiting EGD 06/19/2021-diffuse gastritis, nonobstructing duodenal mass, biopsy of stomach-no malignancy or H. pylori, duodenal mass-adenocarcinoma 16.  Hospital admission 07/12/2021 with nausea/vomiting, abdominal pain, and hypokalemia 17.  Percutaneous gastrostomy tube placement 07/01/2021 18.  07/21/2022 bilateral lower extremity venous Doppler study-negative for DVT.    Disposition: Ms. Samson appears unchanged.  She was last treated with chemotherapy 05/25/2023.  Treatment was held 3 weeks ago due to planned dental  work.  She has missed multiple follow-up visits since then.  She is now completing a course of antibiotics for a dental infection.  She reports an approximate 2-week history of worsening abdominal pain.  We decided to cancel today's treatment and refer for restaging CT scans.  Oxycodone and MS Contin refilled at today's visit.  Creatinine is elevated at 1.58.  No IV contrast with today's scan.  She may be dehydrated.  We will give her a liter of fluids in the office.  We will check a repeat chemistry panel on 07/06/2023.  She will return for a follow-up visit and possible treatment on 07/06/2023.    Misty Stanley  Ioannis Schuh ANP/GNP-BC   07/04/2023  10:11 AM

## 2023-07-04 NOTE — Telephone Encounter (Signed)
-----   Message from Lonna Cobb sent at 07/04/2023  2:32 PM EDT ----- Please let her know CT shows constipation. Recommend she increase laxative. We will review scan in detail with her on 7/24. Thanks

## 2023-07-05 ENCOUNTER — Telehealth (HOSPITAL_BASED_OUTPATIENT_CLINIC_OR_DEPARTMENT_OTHER): Payer: Self-pay

## 2023-07-05 ENCOUNTER — Other Ambulatory Visit: Payer: Self-pay

## 2023-07-05 NOTE — Telephone Encounter (Signed)
   Pre-operative Risk Assessment    Patient Name: Wendy Hoffman  DOB: 09/21/1966 MRN: [id number]     Request for Surgical Clearance    Procedure:  Dental Extraction - Amount of Teeth to be Pulled:  11  Date of Surgery:  Clearance TBD                                 Surgeon:  Beatrice Lecher, DDS, PA Surgeon's Group or Practice Name:   Phone number:  [phone] Fax number:  [phone]   Type of Clearance Requested:   - Medical    Type of Anesthesia:  Local    Additional requests/questions:   None  Wendy Hoffman   07/05/2023, 11:01 AM

## 2023-07-06 ENCOUNTER — Other Ambulatory Visit: Payer: Self-pay | Admitting: Nurse Practitioner

## 2023-07-06 ENCOUNTER — Inpatient Hospital Stay: Payer: Medicaid Other

## 2023-07-06 ENCOUNTER — Inpatient Hospital Stay (HOSPITAL_BASED_OUTPATIENT_CLINIC_OR_DEPARTMENT_OTHER): Payer: Medicaid Other | Admitting: Nurse Practitioner

## 2023-07-06 ENCOUNTER — Other Ambulatory Visit: Payer: Self-pay

## 2023-07-06 ENCOUNTER — Ambulatory Visit (HOSPITAL_COMMUNITY)
Admission: RE | Admit: 2023-07-06 | Discharge: 2023-07-06 | Disposition: A | Discharge status: Home / Self Care | Payer: Medicaid Other | Source: Ambulatory Visit | Attending: Nurse Practitioner | Admitting: Nurse Practitioner

## 2023-07-06 ENCOUNTER — Inpatient Hospital Stay: Payer: Medicaid Other | Admitting: Nutrition

## 2023-07-06 ENCOUNTER — Encounter: Payer: Self-pay | Admitting: Nurse Practitioner

## 2023-07-06 VITALS — BP 123/87 | HR 73 | Resp 18

## 2023-07-06 VITALS — BP 119/80 | HR 94 | Temp 98.1°F | Resp 18 | Ht 64.0 in | Wt 192.4 lb

## 2023-07-06 DIAGNOSIS — C250 Malignant neoplasm of head of pancreas: Secondary | ICD-10-CM

## 2023-07-06 LAB — CMP (CANCER CENTER ONLY)
ALT: 6 U/L (ref 0–44)
AST: 13 U/L — ABNORMAL LOW (ref 15–41)
Albumin: 3.7 g/dL (ref 3.5–5.0)
Alkaline Phosphatase: 131 U/L — ABNORMAL HIGH (ref 38–126)
Anion gap: 9 (ref 5–15)
BUN: 12 mg/dL (ref 6–20)
CO2: 33 mmol/L — ABNORMAL HIGH (ref 22–32)
Calcium: 9.5 mg/dL (ref 8.9–10.3)
Chloride: 97 mmol/L — ABNORMAL LOW (ref 98–111)
Creatinine: 1.21 mg/dL — ABNORMAL HIGH (ref 0.44–1.00)
GFR, Estimated: 53 mL/min — ABNORMAL LOW (ref >60)
Glucose, Bld: 103 mg/dL — ABNORMAL HIGH (ref 70–99)
Potassium: 3.6 mmol/L (ref 3.5–5.1)
Sodium: 139 mmol/L (ref 135–145)
Total Bilirubin: 0.4 mg/dL (ref 0.3–1.2)
Total Protein: 6.8 g/dL (ref 6.5–8.1)

## 2023-07-06 LAB — CBC WITH DIFFERENTIAL (CANCER CENTER ONLY)
Abs Immature Granulocytes: 0.01 10*3/uL (ref 0.00–0.07)
Basophils Absolute: 0 10*3/uL (ref 0.0–0.1)
Basophils Relative: 0 %
Eosinophils Absolute: 0.2 10*3/uL (ref 0.0–0.5)
Eosinophils Relative: 4 %
HCT: 32.8 % — ABNORMAL LOW (ref 36.0–46.0)
Hemoglobin: 10.9 g/dL — ABNORMAL LOW (ref 12.0–15.0)
Immature Granulocytes: 0 %
Lymphocytes Relative: 20 %
Lymphs Abs: 1.2 10*3/uL (ref 0.7–4.0)
MCH: 30.8 pg (ref 26.0–34.0)
MCHC: 33.2 g/dL (ref 30.0–36.0)
MCV: 92.7 fL (ref 80.0–100.0)
Monocytes Absolute: 0.6 10*3/uL (ref 0.1–1.0)
Monocytes Relative: 11 %
Neutro Abs: 3.7 10*3/uL (ref 1.7–7.7)
Neutrophils Relative %: 65 %
Platelet Count: 290 10*3/uL (ref 150–400)
RBC: 3.54 MIL/uL — ABNORMAL LOW (ref 3.87–5.11)
RDW: 14.7 % (ref 11.5–15.5)
WBC Count: 5.7 10*3/uL (ref 4.0–10.5)
nRBC: 0 % (ref 0.0–0.2)

## 2023-07-06 MED ORDER — HEPARIN SOD (PORK) LOCK FLUSH 100 UNIT/ML IV SOLN
500.0000 [IU] | Freq: Once | INTRAVENOUS | Status: AC
  Administered 2023-07-06: 500 [IU] via INTRAVENOUS

## 2023-07-06 MED ORDER — SODIUM CHLORIDE 0.9% FLUSH
10.0000 mL | Freq: Once | INTRAVENOUS | Status: AC
  Administered 2023-07-06: 10 mL via INTRAVENOUS

## 2023-07-06 MED ORDER — SODIUM CHLORIDE 0.9 % IV SOLN: Freq: Once | INTRAVENOUS | Status: AC

## 2023-07-06 MED ORDER — SODIUM CHLORIDE 0.9 % IV SOLN: INTRAVENOUS | Status: AC

## 2023-07-06 NOTE — Patient Instructions (Signed)

## 2023-07-06 NOTE — Telephone Encounter (Signed)
Ladene Artist, MD  You13 hours ago (8:02 PM)    Ok from oncology standpoint, will need cbc within few days of procedure

## 2023-07-06 NOTE — Telephone Encounter (Signed)
Response faxed to requesting party via EPIC fax function.

## 2023-07-06 NOTE — Progress Notes (Signed)
Patient seen by Lonna Cobb NP today  Vitals are within treatment parameters.  Labs reviewed by Lonna Cobb NP and are within treatment parameters.  Per physician team, patient is ready for treatment. Please note that modifications are being made to the treatment plan including no chemo tx today. IVF instead per Lonna Cobb, NP

## 2023-07-06 NOTE — Progress Notes (Signed)
[facility] OFFICE PROGRESS NOTE   Diagnosis: Pancreas cancer  INTERVAL HISTORY:   Ms. Wendy Hoffman returns for follow-up.  She completed a cycle of FOLFIRI 05/25/2023.  Treatment was held 06/13/2023 due to dental issues and planned extraction of multiple teeth.  She missed several follow-up visits after that.  She was seen 07/04/2023 at which time she was having worsening abdominal pain and losing weight.  She was referred for CT scans.  She is seen today to review results.  She continues to have severe upper abdominal pain.  She is nauseated and reports vomiting "constantly".  "I cannot keep anything down".  Still no bowel movement.  She has tried MiraLAX and Dulcolax.  Objective:  Vital signs in last 24 hours:  Blood pressure 119/80, pulse 94, temperature 98.1 F (36.7 C), resp. rate 18, height 5\' 4"  (1.626 m), weight 192 lb 6.4 oz (87.3 kg), SpO2 100%.    HEENT: No thrush or ulcers. Resp: Lungs clear bilaterally. Cardio: Regular rate and rhythm. GI: Tender across the upper abdomen.  Firm fullness right mid upper abdomen. Vascular: No leg edema. Neuro: Alert and oriented. Port-A-Cath without erythema.  Lab Results:  Lab Results  Component Value Date   WBC 5.7 07/06/2023   HGB 10.9 (L) 07/06/2023   HCT 32.8 (L) 07/06/2023   MCV 92.7 07/06/2023   PLT 290 07/06/2023   NEUTROABS 3.7 07/06/2023    Imaging:  CT ABDOMEN PELVIS WO CONTRAST  Result Date: 07/04/2023 CLINICAL DATA:  History of pancreatic cancer. Assess treatment response. * Tracking Code: BO * EXAM: CT ABDOMEN AND PELVIS WITHOUT CONTRAST TECHNIQUE: Multidetector CT imaging of the abdomen and pelvis was performed following the standard protocol without IV contrast. RADIATION DOSE REDUCTION: This exam was performed according to the departmental dose-optimization program which includes automated exposure control, adjustment of the mA and/or kV according to patient size and/or use of iterative reconstruction  technique. COMPARISON:  Abdominopelvic CT 04/12/2023, 03/31/2023 and 12/14/2022. FINDINGS: Lower chest: Clear lung bases. No significant pleural or pericardial effusion. Central venous catheter tip extends to the lower right atrium, similar to previous study. Hepatobiliary: Widespread hepatic metastatic disease again noted with multiple confluent low-density lesions throughout the right and left hepatic lobes. Comparison is limited by the lack of contrast on the current study, although some of the lesions superiorly in the right lobe may be mildly progressive. For example, a lesion in the dome of the right lobe measuring 5.5 x 4.9 cm on image 13/2 previously measured 5.1 x 4.7 cm. A lesion in the medial segment of the left lobe measures up to 5.7 cm on image 16/2 (previously 5.3 cm). The gallbladder is not clearly seen and may be surgically absent. No biliary dilatation. Pancreas: Known mass within the pancreatic head is not well visualized on this noncontrast study although appears grossly unchanged. There is atrophy throughout the pancreatic body and tail with mild ductal dilatation. Spleen: Normal in size without focal abnormality. Adrenals/Urinary Tract: Both adrenal glands appear normal. Stable punctate nonobstructing calculus in the upper pole of the left kidney. No evidence of ureteral calculus, hydronephrosis or suspicious renal mass. Stable small cyst in the lower pole of the left kidney for which no follow-up imaging is recommended. Mild renal cortical scarring and atrophy. The bladder appears unremarkable for its degree of distention. Stomach/Bowel: No enteric contrast administered. The stomach appears unremarkable for its degree of distension. No evidence of bowel wall thickening, distention or surrounding inflammatory change. The appendix appears normal. Prominent stool  throughout the colon. Vascular/Lymphatic: No enlarged abdominopelvic lymph nodes identified. Aortic and branch vessel atherosclerosis  without evidence of aneurysm. Reproductive: Status post hysterectomy. No evidence of adnexal mass. Other: Postsurgical changes in the anterior abdominal wall without evidence of hernia. No ascites, peritoneal nodularity or pneumoperitoneum. Musculoskeletal: Stable sclerotic lesion posteriorly in the superior endplate of L3 from multiple prior studies. No lytic lesion or acute osseous abnormality. Unless specific follow-up recommendations are mentioned in the findings or impression sections, no imaging follow-up of any mentioned incidental findings is recommended. IMPRESSION: 1. Widespread hepatic metastatic disease again noted as seen on multiple previous studies. Some of the lesions superiorly in the right lobe may be mildly progressive, although direct comparison is limited by the lack of contrast on the current study. Consider PET-CT for further evaluation. 2. Grossly unchanged appearance of the pancreatic head mass. 3. No evidence of extrahepatic metastatic disease in the abdomen or pelvis. 4. Stable sclerotic lesion in the superior endplate of L3. 5. Prominent stool throughout the colon consistent with constipation. 6.  Aortic Atherosclerosis (ICD10-I70.0). Electronically Signed   By: Carey Bullocks M.D.   On: 07/04/2023 12:46    Medications: I have reviewed the patient's current medications.  Assessment/Plan: Pancreas cancer -CT abdomen/pelvis without contrast 02/16/2021-multiple large hypoechoic masses in the patient's liver consistent metastatic disease, ill-defined masslike area in the pancreatic head measuring approximate 4.7 cm, possible filling defect in the right ventricle. -EGD 02/19/2021-large infiltrative mass with bleeding in the second portion of the duodenum, appears to be arising near the ampulla, partially obstructive with friable mucosa.  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 -Flexible sigmoidoscopy 02/19/2021-diverticulosis in the sigmoid colon,  nonbleeding external and internal hemorrhoids -Cardiac CT 02/20/2021-mass in the mid RV attached to the interventricular septum measuring 16 mm x 6 mm and concerning for metastatic tumor -MRI abdomen with/without contrast 02/21/2021-mass in the posterior pancreatic head measuring 4.3 x 3.8 cm with obstruction of the central pancreatic duct and diffuse dilatation up to 6 mm consistent with pancreatic adenocarcinoma, liver lesions the largest mass of the central left lobe measuring 9.9 x 9.2 cm consistent with hepatic metastatic disease,  hepatomegaly. -Ultrasound-guided biopsy of a left liver lesion 02/24/2021-adenocarcinoma, morphologically similar to the duodenal biopsy; microsatellite stable, tumor mutation burden 1 -Negative genetic testing -Palliative radiation to the duodenal mass 02/25/2021-03/06/2021 -Cycle 1 FOLFOX 03/17/2021 -Cycle 2 FOLFOX 04/01/2021 -Cycle 3 FOLFIRINOX 04/14/2021 -Cycle 4 FOLFIRINOX 04/27/2021, Udenyca added -Cycle 5 FOLFIRINOX 05/12/2021, Udenyca -MRI abdomen 05/23/2021-decrease in size of pancreas mass and hepatic lesions, no progressive disease -Cycle 6 FOLFIRINOX 05/27/2021 -CT 06/17/2021-multiple liver metastases similar to her prior exam.  Pancreas mass not significantly changed. -CT 06/26/2021-stable pancreas mass, no significant change in size and number of known liver metastases. -CT abdomen/pelvis 07/12/2021-new gastrostomy tube.  Multiple liver masses slightly decreased in size from prior exam.  Pancreas head mass unchanged. -Cycle 1 gemcitabine/Abraxane 07/30/2021 -Cycle 2 gemcitabine/Abraxane 08/13/2021 -Cycle 3 gemcitabine 08/27/2021, Abraxane held due to significant bilateral toe pain, question neuropathy -Cycle 4 gemcitabine, Abraxane held due to neuropathy, 09/10/2021 -Cycle 5 gemcitabine, Abraxane held due to neuropathy, 09/24/2021 -Cycle 6 gemcitabine, Abraxane held due to neuropathy 10/16/2021 -CTs 10/29/2021-mild progression of multifocal liver metastases; mild increase  in size of hypoenhancing mass within the head of the pancreas; new mild pleural nodularity along the posterior lower lobes -Cycle 7 gemcitabine/Abraxane 11/03/2021 -Cycle 8 gemcitabine/Abraxane 11/23/2021 -Cycle 9 gemcitabine/Abraxane 12/10/2021 -Cycle 10 Gemcitabine/Abraxane 12/24/2021 -CT abdomen/pelvis 01/04/2022-decrease size of pancreas head mass, no change in multifocal hepatic metastases -Cycle  11 gemcitabine/Abraxane 01/07/2022 -Cycle 12 gemcitabine/Abraxane 02/04/2022 -Cycle 13 Gemcitabine/Abraxane 02/18/2022 -Cycle 14 gemcitabine/Abraxane 03/11/2022 -Cycle 15 gemcitabine/Abraxane 03/29/2022 -Cycle 16 gemcitabine/Abraxane 04/16/2022 -CT abdomen/pelvis 04/22/2022-mixed response in the liver, some lesions larger, some smaller, no change in pancreas head mass, no evidence of metastatic disease elsewhere in the abdomen or pelvis -Cycle 17 gemcitabine/Abraxane 05/17/2022 -Cycle 18 Gemcitabine/Abraxane 06/01/2022 -Cycle 19 gemcitabine/Abraxane 06/18/2022 -Cycle 20 gemcitabine/Abraxane 07/05/2022 -Cycle 21 gemcitabine/Abraxane 08/09/2022 -Cycle 22 gemcitabine/Abraxane 08/23/2022 -CT abdomen/pelvis 09/01/2022-stable liver metastases, difficult to visualize the pancreas head mass, no evidence of disease progression -Cycle 23 gemcitabine/Abraxane 09/06/2022 -Cycle 24 gemcitabine/Abraxane 09/20/2022 -Cycle 25 gemcitabine/Abraxane 10/18/2022 -Cycle 26 gemcitabine/Abraxane 11/01/2022 -Cycle 27 gemcitabine/Abraxane 11/29/2022 -CT abdomen/pelvis 12/14/2022-increased size of liver metastases, less well-defined pancreas mass, 4 mm right lung nodule -Cycle 1 FOLFIRINOX 01/10/2023 -Cycle 2 FOLFIRINOX 01/24/2023, 5-FU dose reduced secondary to mucositis, chemotherapy plan dose reduce for weight loss -Develop pruritus during the oxaliplatin infusion, initial improvement with Benadryl and Solu-Medrol, oxaliplatin was resumed and the pruritus recurred.  Oxaliplatin was discontinued. -Chemotherapy held 02/07/2023 due to diarrhea,  nausea/vomiting, dehydration -Cycle 3 FOLFIRINOX 03/07/2023 -Cycle 4 FOLFIRINOX 03/22/2023, 5-FU dose reduction due to mucositis, chemotherapy plan dose reduction for weight loss -CTs 03/30/2021-numerous bulky liver lesions, majority are slightly diminished in size though some lesions are unchanged or slightly enlarged. -Cycle 5 FOLFIRINOX 04/05/2023 -04/05/2023 reaction to oxaliplatin with dyspnea and pruritus -FOLFIRI every 3 weeks beginning 04/27/2023 -FOLFIRI 05/25/2023 -CTs 07/04/2023-widespread hepatic metastatic disease again noted, some of the lesions superiorly in the right lobe may be mildly progressive, direct comparison limited by lack of contrast.  Grossly unchanged pancreatic head mass.  Constipation. 2.  Iron deficiency anemia-improved -Feraheme 510 mg 02/20/2021 3.  Protein calorie malnutrition 4.  Possible right ventricular filling defect on noncontrast CT scan MRI cardiac morphology 02/21/2021-mass in the mid RV attached to the interventricular septum measures 16 mm x 6 mm.  Mass appears heterogeneous with area of enhancement on LGE imaging.  Mass is concerning for metastatic tumor.  Normal LV size and systolic function.  Normal RV size and systolic function. 5.  Hypertension 6.  History of CVA 7.  Hyperlipidemia 8.  Hypothyroidism 9.  Morbid obesity 10.  Asthma 11.  Migraines 12.  Pain secondary to #1 13.  Port-A-Cath placement 02/25/2021 14.  Hypokalemia 04/01/2021-started a potassium supplement 15.  Hospital admission 06/17/2021-intractable nausea and vomiting EGD 06/19/2021-diffuse gastritis, nonobstructing duodenal mass, biopsy of stomach-no malignancy or H. pylori, duodenal mass-adenocarcinoma 16.  Hospital admission 07/12/2021 with nausea/vomiting, abdominal pain, and hypokalemia 17.  Percutaneous gastrostomy tube placement 07/01/2021 18.  07/21/2022 bilateral lower extremity venous Doppler study-negative for DVT.    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  She  was last treated with FOLFIRI 05/25/2023.  Earlier this week she presented with severe upper abdominal pain, nausea and constipation.  Restaging CTs overall appeared stable with possible slight increased disease in the right lobe of the liver.  We reviewed the results with her at today's visit.  She has persistent upper abdominal pain, constipation and now fairly continuous nausea/vomiting.  We discussed the possibility of an obstructive process specifically involving the duodenum.  We are referring her for an urgent barium swallow.  She will receive IV fluids while in the office today.  We discussed hospitalization which she declines at present.  Follow-up pending results of the above.  Patient seen with Dr. Truett Perna.  Lonna Cobb ANP/GNP-BC   07/06/2023  9:46 AM  This was a shared visit with Lonna Cobb.  Ms. Tiller was  interviewed and examined.  We reviewed the CT findings from 07/04/2023  with her.  I reviewed the CT images.  She has increased nausea vomiting, and upper abdominal pain.  We are concerned she is developing gastric outlet obstruction related to tumor in the duodenum or stomach.  She will be referred for an upper GI study today.  Will refer her to gastroenterology to consider an upper endoscopy depending on the upper GI findings.  Chemotherapy will be placed on hold.  She will continue the current narcotic pain regimen.  I was present for greater than 50% of today's visit.  I performed medical decision making.  Mancel Bale, MD

## 2023-07-06 NOTE — Progress Notes (Signed)
Brief follow up with patient during IVF.  Patient diagnosed with Pancreas cancer in March 2022. Noted hepatic metastatic disease and constipation on CT scan. Last FOLFIRI given June 12.  Weight on July 24 192 pounds 6.4 oz. decreased from 200 pounds May 15.  Labs include Glucose 103 and Creatinine 1.21.  Patient needs dental extractions however, has not had any yet. Last treatment was held and today's treatment cancelled and giving IVF. She has constipation with abdominal pain. Reports nausea with constant vomiting. Provider evaluating for possible obstruction. Urgent Barium Swallow scheduled this afternoon.  Nutrition Diagnosis: Inadequate oral intake related to cancer and associated treatments as evidenced by patient's self report of very little oral intake over the past few days and continuous vomiting.  Intervention: Provided support and encouragement. Encouraged fluids as able. Once problem identified, additional nutrition information may be warranted.  Monitoring, Evaluation, Goals: Monitor for results of barium swallow and increase oral intake as appropriate.  Next Visit: To be scheduled as needed.

## 2023-07-07 ENCOUNTER — Other Ambulatory Visit: Payer: Self-pay | Admitting: *Deleted

## 2023-07-07 ENCOUNTER — Telehealth: Payer: Self-pay | Admitting: *Deleted

## 2023-07-07 ENCOUNTER — Encounter: Payer: Self-pay | Admitting: *Deleted

## 2023-07-07 DIAGNOSIS — C250 Malignant neoplasm of head of pancreas: Secondary | ICD-10-CM

## 2023-07-07 NOTE — Progress Notes (Signed)
Faxed referral order, demographics and chart information for "urgent" referral to Dr. Levora Angel w/[facility]. Ms. Hoopingarner notified via MyChart message to expect a call. Also notified her by phone via voicemail.

## 2023-07-07 NOTE — Telephone Encounter (Signed)
Informed patient that [facility] no longer accepts her insurance plan. Dr. Truett Perna will reach out to [facility] to get her in asap. She is still having N/V. Agrees to come in on 7/29 at 1145 to see NP at Dr. Kalman Drape request.

## 2023-07-08 ENCOUNTER — Inpatient Hospital Stay: Payer: Medicaid Other

## 2023-07-08 ENCOUNTER — Other Ambulatory Visit: Payer: Self-pay

## 2023-07-08 ENCOUNTER — Telehealth: Payer: Self-pay | Admitting: *Deleted

## 2023-07-08 NOTE — Telephone Encounter (Signed)
Called to f/u on patient. She reports she is still in pain and vomits w/solid foods. She is able to keep liquids down. Instructed her to please go to ER if she is not able to keep fluids down.

## 2023-07-11 ENCOUNTER — Inpatient Hospital Stay: Payer: Medicaid Other | Admitting: Nurse Practitioner

## 2023-07-11 ENCOUNTER — Encounter: Payer: Self-pay | Admitting: Oncology

## 2023-07-11 ENCOUNTER — Telehealth: Payer: Self-pay | Admitting: Nurse Practitioner

## 2023-07-11 NOTE — Telephone Encounter (Signed)
TC to Pt to inquire about missed appointment today left. v/m message to return call to the office to reschedule appointment.

## 2023-07-12 ENCOUNTER — Other Ambulatory Visit: Payer: Self-pay

## 2023-07-14 ENCOUNTER — Telehealth: Payer: Self-pay

## 2023-07-14 ENCOUNTER — Other Ambulatory Visit (HOSPITAL_BASED_OUTPATIENT_CLINIC_OR_DEPARTMENT_OTHER): Payer: Self-pay

## 2023-07-14 ENCOUNTER — Inpatient Hospital Stay: Payer: Medicaid Other | Attending: Nurse Practitioner | Admitting: Nurse Practitioner

## 2023-07-14 ENCOUNTER — Other Ambulatory Visit: Payer: Self-pay

## 2023-07-14 ENCOUNTER — Encounter: Payer: Self-pay | Admitting: Nurse Practitioner

## 2023-07-14 DIAGNOSIS — C787 Secondary malignant neoplasm of liver and intrahepatic bile duct: Secondary | ICD-10-CM | POA: Insufficient documentation

## 2023-07-14 DIAGNOSIS — E876 Hypokalemia: Secondary | ICD-10-CM | POA: Diagnosis not present

## 2023-07-14 DIAGNOSIS — R112 Nausea with vomiting, unspecified: Secondary | ICD-10-CM | POA: Insufficient documentation

## 2023-07-14 DIAGNOSIS — C250 Malignant neoplasm of head of pancreas: Secondary | ICD-10-CM | POA: Insufficient documentation

## 2023-07-14 DIAGNOSIS — B370 Candidal stomatitis: Secondary | ICD-10-CM | POA: Diagnosis not present

## 2023-07-14 DIAGNOSIS — R1310 Dysphagia, unspecified: Secondary | ICD-10-CM | POA: Insufficient documentation

## 2023-07-14 DIAGNOSIS — R109 Unspecified abdominal pain: Secondary | ICD-10-CM | POA: Diagnosis not present

## 2023-07-14 DIAGNOSIS — E785 Hyperlipidemia, unspecified: Secondary | ICD-10-CM | POA: Diagnosis not present

## 2023-07-14 DIAGNOSIS — I10 Essential (primary) hypertension: Secondary | ICD-10-CM | POA: Diagnosis not present

## 2023-07-14 DIAGNOSIS — E46 Unspecified protein-calorie malnutrition: Secondary | ICD-10-CM | POA: Insufficient documentation

## 2023-07-14 DIAGNOSIS — D509 Iron deficiency anemia, unspecified: Secondary | ICD-10-CM | POA: Diagnosis not present

## 2023-07-14 DIAGNOSIS — C259 Malignant neoplasm of pancreas, unspecified: Secondary | ICD-10-CM | POA: Diagnosis not present

## 2023-07-14 DIAGNOSIS — J45909 Unspecified asthma, uncomplicated: Secondary | ICD-10-CM | POA: Diagnosis not present

## 2023-07-14 DIAGNOSIS — E039 Hypothyroidism, unspecified: Secondary | ICD-10-CM | POA: Insufficient documentation

## 2023-07-14 DIAGNOSIS — G43909 Migraine, unspecified, not intractable, without status migrainosus: Secondary | ICD-10-CM | POA: Insufficient documentation

## 2023-07-14 DIAGNOSIS — Z8673 Personal history of transient ischemic attack (TIA), and cerebral infarction without residual deficits: Secondary | ICD-10-CM | POA: Diagnosis not present

## 2023-07-14 MED ORDER — MORPHINE SULFATE (CONCENTRATE) 10 MG/0.5ML PO SOLN
20.0000 mg | Freq: Three times a day (TID) | ORAL | 0 refills | Status: DC | PRN
Start: 2023-07-14 — End: 2023-08-02
  Filled 2023-07-14: qty 30, 7d supply, fill #0

## 2023-07-14 MED ORDER — OXYCODONE HCL 10 MG PO TABS
10.0000 mg | ORAL_TABLET | ORAL | 0 refills | Status: DC | PRN
Start: 2023-07-14 — End: 2023-08-02
  Filled 2023-07-14: qty 60, 5d supply, fill #0

## 2023-07-14 MED ORDER — FLUCONAZOLE 100 MG PO TABS
100.0000 mg | ORAL_TABLET | Freq: Every day | ORAL | 0 refills | Status: DC
Start: 2023-07-14 — End: 2023-08-09
  Filled 2023-07-14: qty 4, 4d supply, fill #0

## 2023-07-14 NOTE — Progress Notes (Signed)
[facility] OFFICE PROGRESS NOTE   Diagnosis: Pancreas cancer  INTERVAL HISTORY:   Wendy Hoffman returns for follow-up.  She is tolerating liquids.  She continues to vomit after eating solid food.  Continued upper abdominal pain.  She had a small bowel movement last night.  Objective:  Vital signs in last 24 hours:  Blood pressure 125/88, pulse 100, temperature 98.1 F (36.7 C), temperature source Oral, resp. rate 18, height 5\' 4"  (1.626 m), weight 190 lb (86.2 kg), SpO2 100%.    HEENT: Scattered white patches over the buccal mucosa and tongue. Resp: Lungs clear bilaterally. Cardio: Regular rate and rhythm. GI: Marked tenderness over the upper abdomen.  Firm fullness right mid upper abdomen. Vascular: No leg edema. Port-A-Cath without erythema.  Lab Results:  Lab Results  Component Value Date   WBC 5.7 07/06/2023   HGB 10.9 (L) 07/06/2023   HCT 32.8 (L) 07/06/2023   MCV 92.7 07/06/2023   PLT 290 07/06/2023   NEUTROABS 3.7 07/06/2023    Imaging:  No results found.  Medications: I have reviewed the patient's current medications.  Assessment/Plan: Pancreas cancer -CT abdomen/pelvis without contrast 02/16/2021-multiple large hypoechoic masses in the patient's liver consistent metastatic disease, ill-defined masslike area in the pancreatic head measuring approximate 4.7 cm, possible filling defect in the right ventricle. -EGD 02/19/2021-large infiltrative mass with bleeding in the second portion of the duodenum, appears to be arising near the ampulla, partially obstructive with friable mucosa.  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 -Flexible sigmoidoscopy 02/19/2021-diverticulosis in the sigmoid colon, nonbleeding external and internal hemorrhoids -Cardiac CT 02/20/2021-mass in the mid RV attached to the interventricular septum measuring 16 mm x 6 mm and concerning for metastatic tumor -MRI abdomen with/without contrast  02/21/2021-mass in the posterior pancreatic head measuring 4.3 x 3.8 cm with obstruction of the central pancreatic duct and diffuse dilatation up to 6 mm consistent with pancreatic adenocarcinoma, liver lesions the largest mass of the central left lobe measuring 9.9 x 9.2 cm consistent with hepatic metastatic disease,  hepatomegaly. -Ultrasound-guided biopsy of a left liver lesion 02/24/2021-adenocarcinoma, morphologically similar to the duodenal biopsy; microsatellite stable, tumor mutation burden 1 -Negative genetic testing -Palliative radiation to the duodenal mass 02/25/2021-03/06/2021 -Cycle 1 FOLFOX 03/17/2021 -Cycle 2 FOLFOX 04/01/2021 -Cycle 3 FOLFIRINOX 04/14/2021 -Cycle 4 FOLFIRINOX 04/27/2021, Udenyca added -Cycle 5 FOLFIRINOX 05/12/2021, Udenyca -MRI abdomen 05/23/2021-decrease in size of pancreas mass and hepatic lesions, no progressive disease -Cycle 6 FOLFIRINOX 05/27/2021 -CT 06/17/2021-multiple liver metastases similar to her prior exam.  Pancreas mass not significantly changed. -CT 06/26/2021-stable pancreas mass, no significant change in size and number of known liver metastases. -CT abdomen/pelvis 07/12/2021-new gastrostomy tube.  Multiple liver masses slightly decreased in size from prior exam.  Pancreas head mass unchanged. -Cycle 1 gemcitabine/Abraxane 07/30/2021 -Cycle 2 gemcitabine/Abraxane 08/13/2021 -Cycle 3 gemcitabine 08/27/2021, Abraxane held due to significant bilateral toe pain, question neuropathy -Cycle 4 gemcitabine, Abraxane held due to neuropathy, 09/10/2021 -Cycle 5 gemcitabine, Abraxane held due to neuropathy, 09/24/2021 -Cycle 6 gemcitabine, Abraxane held due to neuropathy 10/16/2021 -CTs 10/29/2021-mild progression of multifocal liver metastases; mild increase in size of hypoenhancing mass within the head of the pancreas; new mild pleural nodularity along the posterior lower lobes -Cycle 7 gemcitabine/Abraxane 11/03/2021 -Cycle 8 gemcitabine/Abraxane 11/23/2021 -Cycle 9  gemcitabine/Abraxane 12/10/2021 -Cycle 10 Gemcitabine/Abraxane 12/24/2021 -CT abdomen/pelvis 01/04/2022-decrease size of pancreas head mass, no change in multifocal hepatic metastases -Cycle 11 gemcitabine/Abraxane 01/07/2022 -Cycle 12 gemcitabine/Abraxane 02/04/2022 -Cycle 13 Gemcitabine/Abraxane 02/18/2022 -Cycle 14 gemcitabine/Abraxane 03/11/2022 -Cycle 15  gemcitabine/Abraxane 03/29/2022 -Cycle 16 gemcitabine/Abraxane 04/16/2022 -CT abdomen/pelvis 04/22/2022-mixed response in the liver, some lesions larger, some smaller, no change in pancreas head mass, no evidence of metastatic disease elsewhere in the abdomen or pelvis -Cycle 17 gemcitabine/Abraxane 05/17/2022 -Cycle 18 Gemcitabine/Abraxane 06/01/2022 -Cycle 19 gemcitabine/Abraxane 06/18/2022 -Cycle 20 gemcitabine/Abraxane 07/05/2022 -Cycle 21 gemcitabine/Abraxane 08/09/2022 -Cycle 22 gemcitabine/Abraxane 08/23/2022 -CT abdomen/pelvis 09/01/2022-stable liver metastases, difficult to visualize the pancreas head mass, no evidence of disease progression -Cycle 23 gemcitabine/Abraxane 09/06/2022 -Cycle 24 gemcitabine/Abraxane 09/20/2022 -Cycle 25 gemcitabine/Abraxane 10/18/2022 -Cycle 26 gemcitabine/Abraxane 11/01/2022 -Cycle 27 gemcitabine/Abraxane 11/29/2022 -CT abdomen/pelvis 12/14/2022-increased size of liver metastases, less well-defined pancreas mass, 4 mm right lung nodule -Cycle 1 FOLFIRINOX 01/10/2023 -Cycle 2 FOLFIRINOX 01/24/2023, 5-FU dose reduced secondary to mucositis, chemotherapy plan dose reduce for weight loss -Develop pruritus during the oxaliplatin infusion, initial improvement with Benadryl and Solu-Medrol, oxaliplatin was resumed and the pruritus recurred.  Oxaliplatin was discontinued. -Chemotherapy held 02/07/2023 due to diarrhea, nausea/vomiting, dehydration -Cycle 3 FOLFIRINOX 03/07/2023 -Cycle 4 FOLFIRINOX 03/22/2023, 5-FU dose reduction due to mucositis, chemotherapy plan dose reduction for weight loss -CTs 03/30/2021-numerous bulky liver  lesions, majority are slightly diminished in size though some lesions are unchanged or slightly enlarged. -Cycle 5 FOLFIRINOX 04/05/2023 -04/05/2023 reaction to oxaliplatin with dyspnea and pruritus -FOLFIRI every 3 weeks beginning 04/27/2023 -FOLFIRI 05/25/2023 -CTs 07/04/2023-widespread hepatic metastatic disease again noted, some of the lesions superiorly in the right lobe may be mildly progressive, direct comparison limited by lack of contrast.  Grossly unchanged pancreatic head mass.  Constipation. 2.  Iron deficiency anemia-improved -Feraheme 510 mg 02/20/2021 3.  Protein calorie malnutrition 4.  Possible right ventricular filling defect on noncontrast CT scan MRI cardiac morphology 02/21/2021-mass in the mid RV attached to the interventricular septum measures 16 mm x 6 mm.  Mass appears heterogeneous with area of enhancement on LGE imaging.  Mass is concerning for metastatic tumor.  Normal LV size and systolic function.  Normal RV size and systolic function. 5.  Hypertension 6.  History of CVA 7.  Hyperlipidemia 8.  Hypothyroidism 9.  Morbid obesity 10.  Asthma 11.  Migraines 12.  Pain secondary to #1 13.  Port-A-Cath placement 02/25/2021 14.  Hypokalemia 04/01/2021-started a potassium supplement 15.  Hospital admission 06/17/2021-intractable nausea and vomiting EGD 06/19/2021-diffuse gastritis, nonobstructing duodenal mass, biopsy of stomach-no malignancy or H. pylori, duodenal mass-adenocarcinoma 16.  Hospital admission 07/12/2021 with nausea/vomiting, abdominal pain, and hypokalemia 17.  Percutaneous gastrostomy tube placement 07/01/2021 18.  07/21/2022 bilateral lower extremity venous Doppler study-negative for DVT.  Disposition: Wendy Hoffman appears unchanged.  She has persistent nausea/vomiting with intake of solids and increased abdominal pain.  Recent barium swallow showed no evidence of a gastric outlet obstruction.  She has been referred to GI to consider an upper endoscopy due to concern  for growth of tumor into the duodenum.  At present she is tolerating liquids.  Pain medications were refilled.  She will return for follow-up in 2 weeks.  She understands to proceed to the emergency department if she becomes unable to tolerate liquids.  Patient seen with Dr. Truett Perna.    Wendy Hoffman   07/14/2023  12:04 PM This was a shared visit with Wendy Hoffman.  Wendy Hoffman continues to have nausea and vomiting when she eats solids.  We made a referral to [facility] gastroenterology to consider a repeat endoscopy.  She may be a candidate for a stent if the pancreas mass is invading the duodenum. She will go to the emergency room if she is  unable to tolerate liquids.  I was present for greater than 50% of today's visit.  I performed medical stage making.  Mancel Bale, MD

## 2023-07-14 NOTE — Telephone Encounter (Signed)
The pt has been set up for enteroscopy on 8/22 at 130 pm at [facility] with GM  Put pt on for  8/9 with Hyacinth Meeker PA     This is a young patient with metastatic colon cancer diagnosed several years ago, she has been treated with multiple chemotherapy regimens and appears to be slowly progressing. Now with intractable nausea and vomiting. An upper GI and CT did not reveal evidence of gastric outlet obstruction, but I am concerned there has been growth of tumor into the duodenum. On your initial EGD there was a mass in the duodenum. Can you consider repeat endoscopy? She may be a stent candidate if there is tumor invading the duodenum  Thanks, Brad  12:40 PM GS sorry, pancreas cancer  12:41 PM Lemar Lofty., MD was added by Napoleon Form, MD. 1:05 PM KN Napoleon Form, MD Dr.Sherrill, I do not do duodenal stenting, I am going to defer it to [facility]. I saw the patient while covering inpatient service, she is not established with [facility]  1:09 PM GS Ladene Artist, MD Okay, how do we get her in for an appointment  1:22 PM Leanor Kail was added by Napoleon Form, MD. 1:24 PM KN Napoleon Form, MD We can bring her in for an urgent visit with first available provider but will not be helpful if Gabe doesn't have availability to do duodenal stenting or if she is not a candidate.   1:27 PM GS Ladene Artist, MD Okay, she is tolerating liquids, but no solids, thanks for getting her an appointment  1:33 PM You were added by Lemar Lofty., MD. 1:36 PM Lemar Lofty., MD Gaye Scorza, Please offer this patient next available urgent slot with any provider or APP. If I have availability she can be set up. This patient will be evaluated for nausea/vomiting and history of metastatic pancreas cancer with previous duodenal invasion. Please offer her my August 22 130 procedure slot for enteroscopy +/- enteral stenting. Thanks. GM  GM Make sure that  we can see her with her insurance. Thanks. GM  1:38 PM GS Ladene Artist, MD Thanks

## 2023-07-15 ENCOUNTER — Encounter: Payer: Self-pay | Admitting: Physician Assistant

## 2023-07-15 ENCOUNTER — Telehealth: Payer: Self-pay

## 2023-07-15 NOTE — Telephone Encounter (Signed)
Fax over Dental treatment release to Dr. Ralene Cork

## 2023-07-15 NOTE — Telephone Encounter (Signed)
The pt has been set up for 8/6 for office visit.  Wendy Hoffman has spoken to the pt and advised of the appt.

## 2023-07-16 ENCOUNTER — Other Ambulatory Visit: Payer: Self-pay

## 2023-07-18 NOTE — Progress Notes (Deleted)
07/18/2023 Wendy Hoffman [id number] [birth year]  Referring provider: [name],* Dr. Truett Perna Primary GI doctor: {acdocs:27040}  ASSESSMENT AND PLAN:   There are no diagnoses linked to this encounter.   Patient Care Team: [name], FNP as PCP - General (Internal Medicine) Ladene Artist, MD as Consulting Physician (Oncology) Radonna Ricker, RN (Inactive) as Oncology Nurse Navigator  HISTORY OF PRESENT ILLNESS: 57 y.o. female with a past medical history of HTN, COPD, metastatic pancreatic cancer with liver lesion and infiltrative doudenal mass, IDA, protein calorie malnutrition. s/p radiation and chemotherapy and others listed below presents for evaluation of nausea, vomiting and AB pain.   -CT abdomen/pelvis without contrast 02/16/2021-multiple large hypoechoic masses in the patient's liver consistent metastatic disease, ill-defined masslike area in the pancreatic head measuring approximate 4.7 cm, possible filling defect in the right ventricle. -EGD 02/19/2021-large infiltrative mass with bleeding in the second portion of the duodenum, appears to be arising near the ampulla, partially obstructive with friable mucosa.  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 -Flexible sigmoidoscopy 02/19/2021-diverticulosis in the sigmoid colon, nonbleeding external and internal hemorrhoids -Cardiac CT 02/20/2021-mass in the mid RV attached to the interventricular septum measuring 16 mm x 6 mm and concerning for metastatic tumor -MRI abdomen with/without contrast 02/21/2021-mass in the posterior pancreatic head measuring 4.3 x 3.8 cm with obstruction of the central pancreatic duct and diffuse dilatation up to 6 mm consistent with pancreatic adenocarcinoma, liver lesions the largest mass of the central left lobe measuring 9.9 x 9.2 cm consistent with hepatic metastatic disease,  hepatomegaly. -Ultrasound-guided biopsy of a left liver lesion  02/24/2021-adenocarcinoma, morphologically similar to the duodenal biopsy; microsatellite stable, tumor mutation burden 1 Palliative radiation of duodenal mass 03/16-03/25/2022.  Has undergone multiple rounds of chemotherapy with most recent CT 07/04/2023 showing progression of liver lobe disease, unchanged pancreatic head mass, constipation.  06/19/2021 EGD in hospital for N/V, showed -diffuse gastritis, nonobstructing duodenal mass, biopsy of stomach-no malignancy or H. pylori, duodenal mass-adenocarcinoma  She had subsquent PERC gastrostomy tube placement 07/01/2021.   Last oncology visit with Lonna Cobb NP on 07/14/23 patient continued to have nausea/vomiting. She can tolerate liquids but not anything else, they would like to consider EGD to evaluate doudenal tumor.  07/06/2023 showed no gastric outlet obstruction  Patient set up for enteroscopy 08/22 at 130 pm [place] with Dr. Meridee Score.   Patient {Actions; denies-reports:120008} family history of colon cancer or other gastrointestinal malignancies.   She {Actions; denies-reports:120008} blood thinner use.  She {Actions; denies-reports:120008} NSAID use.  She {Actions; denies-reports:120008} ETOH use.   She {Actions; denies-reports:120008} tobacco use.  She {Actions; denies-reports:120008} drug use.    She  reports that she has quit smoking. Her smoking use included cigarettes. She has never used smokeless tobacco. She reports that she does not drink alcohol and does not use drugs.  RELEVANT LABS AND IMAGING: CBC    Component Value Date/Time   WBC 5.7 07/06/2023 0855   WBC 7.6 04/19/2023 1608   RBC 3.54 (L) 07/06/2023 0855   HGB 10.9 (L) 07/06/2023 0855   HCT 32.8 (L) 07/06/2023 0855   PLT 290 07/06/2023 0855   MCV 92.7 07/06/2023 0855   MCH 30.8 07/06/2023 0855   MCHC 33.2 07/06/2023 0855   RDW 14.7 07/06/2023 0855   LYMPHSABS 1.2 07/06/2023 0855   MONOABS 0.6 07/06/2023 0855   EOSABS 0.2 07/06/2023 0855   BASOSABS  0.0 07/06/2023 0855   Recent Labs    03/07/23 1020 03/21/23  1055 04/04/23 1052 04/12/23 1447 04/19/23 1608 04/27/23 1014 05/25/23 0840 06/13/23 0750 07/04/23 0942 07/06/23 0855  HGB 9.4* 10.4* 9.9* 9.4* 9.4* 9.2* 9.7* 10.8* 11.0* 10.9*    CMP     Component Value Date/Time   NA 139 07/06/2023 0855   NA 141 11/01/2019 1559   K 3.6 07/06/2023 0855   CL 97 (L) 07/06/2023 0855   CO2 33 (H) 07/06/2023 0855   GLUCOSE 103 (H) 07/06/2023 0855   BUN 12 07/06/2023 0855   BUN 12 11/01/2019 1559   CREATININE 1.21 (H) 07/06/2023 0855   CREATININE 0.71 11/16/2013 1528   CALCIUM 9.5 07/06/2023 0855   PROT 6.8 07/06/2023 0855   ALBUMIN 3.7 07/06/2023 0855   AST 13 (L) 07/06/2023 0855   ALT 6 07/06/2023 0855   ALKPHOS 131 (H) 07/06/2023 0855   BILITOT 0.4 07/06/2023 0855   GFRNONAA 53 (L) 07/06/2023 0855   GFRNONAA >89 11/16/2013 1528   GFRAA >60 07/21/2020 1857   GFRAA >89 11/16/2013 1528      Latest Ref Rng & Units 07/06/2023    8:55 AM 07/04/2023    9:42 AM 06/13/2023    7:50 AM  Hepatic Function  Total Protein 6.5 - 8.1 g/dL 6.8  7.2  6.4   Albumin 3.5 - 5.0 g/dL 3.7  3.7  3.6   AST 15 - 41 U/L 13  12  10    ALT 0 - 44 U/L 6  7  <5   Alk Phosphatase 38 - 126 U/L 131  138  135   Total Bilirubin 0.3 - 1.2 mg/dL 0.4  0.4  0.4       Current Medications:   Current Outpatient Medications (Endocrine & Metabolic):    dexamethasone (DECADRON) 2 MG tablet, Take 5 tablets (10 mg total) by mouth as directed. Take 5 tabs (10mg ) at 10pm night prior to each chemotherapy-every 2 weeks   predniSONE (DELTASONE) 10 MG tablet, Take 1 tablet (10 mg total) by mouth daily with breakfast.  Current Outpatient Medications (Cardiovascular):    furosemide (LASIX) 20 MG tablet, Take 1 tablet (20 mg total) by mouth 2 (two) times daily as needed for edema.  Current Outpatient Medications (Respiratory):    albuterol (VENTOLIN HFA) 108 (90 Base) MCG/ACT inhaler, INHALE 1 TO 2 PUFFS BY MOUTH EVERY 6  HOURS AS NEEDED FOR WHEEZING AND FOR SHORTNESS OF BREATH   magic mouthwash (nystatin, diphenhydrAMINE, alum & mag hydroxide) suspension mixture*, Swish 5 mL for 1 minute and spit four times daily as needed for mouth pain.   SYMBICORT 160-4.5 MCG/ACT inhaler, Inhale 2 puffs into the lungs 2 (two) times daily.  Current Outpatient Medications (Analgesics):    morphine (MS CONTIN) 60 MG 12 hr tablet, Take 1 tablet (60 mg total) by mouth every 12 (twelve) hours.   Morphine Sulfate (MORPHINE CONCENTRATE) 10 MG/0.5ML SOLN concentrated solution, Place 1-1.5 mLs (20-30 mg total) under the tongue every 8 (eight) hours as needed for severe pain (pain not relieved with oxycodone).   Oxycodone HCl 10 MG TABS, Take 1-2 tablets (10-20 mg total) by mouth every 4 (four) hours as needed (Pain).   Current Outpatient Medications (Other):    bisacodyl (DULCOLAX) 5 MG EC tablet, Take 2 tablets (10 mg total) by mouth daily as needed for moderate constipation.   clindamycin (CLEOCIN) 300 MG capsule, SMARTSIG:1 Capsule(s) By Mouth   DULoxetine (CYMBALTA) 30 MG capsule, Take 2 capsules (60 mg total) by mouth daily.   fluconazole (DIFLUCAN) 100 MG tablet, Take  1 tablet (100 mg total) by mouth daily. X 4 days   gabapentin (NEURONTIN) 300 MG capsule, Take 1 capsule (300 mg total) by mouth at bedtime.   lidocaine-prilocaine (EMLA) cream, Apply 1 tablespoon to port site 2 hours prior to stick to numb site.   magic mouthwash (nystatin, diphenhydrAMINE, alum & mag hydroxide) suspension mixture*, Swish 5 mL for 1 minute and spit four times daily as needed for mouth pain.   ondansetron (ZOFRAN) 8 MG tablet, Take 1 tablet (8 mg total) by mouth every 8 (eight) hours as needed for nausea or vomiting.   ondansetron (ZOFRAN-ODT) 8 MG disintegrating tablet, Take 1 tablet (8 mg total) by mouth every 8 (eight) hours as needed for nausea or vomiting.   pantoprazole (PROTONIX) 40 MG tablet, Take 1 tablet (40 mg total) by mouth 2 (two) times  daily.   potassium chloride (KLOR-CON M) 10 MEQ tablet, TAKE 1 TABLET BY MOUTH DAILY AS DIRECTED WHEN USING FUROSEMIDE   prochlorperazine (COMPAZINE) 10 MG tablet, TAKE 1 TABLET(10 MG) BY MOUTH EVERY 6 HOURS AS NEEDED FOR NAUSEA OR VOMITING   scopolamine (TRANSDERM-SCOP) 1 MG/3DAYS, Place 1 patch (1.5 mg total) onto the skin every 3 (three) days.   zolpidem (AMBIEN) 10 MG tablet, Take 1 tablet (10 mg total) by mouth at bedtime as needed for sleep. * These medications belong to multiple therapeutic classes and are listed under each applicable group.  Medical History:  Past Medical History:  Diagnosis Date   Anemia    Anxiety    ARF (acute renal failure) (HCC) 06/18/2021   Arthritis    Asthma    Cervical ca (HCC)    H/O: hysterectomy    High cholesterol    Hypertension    Hypothyroidism    Insomnia    Medical history non-contributory    Migraine    Morbid obesity (HCC)    Pancreatic cancer (HCC) 02/2021   Shortness of breath    Stroke [facility])    Thyroid disease    Allergies:  Allergies  Allergen Reactions   Blueberry [Vaccinium Angustifolium] Anaphylaxis   Mango Flavor Anaphylaxis   Oxaliplatin Shortness Of Breath and Itching    Patient has reacted to oxaliplatin with minor symptoms of neuropathy and itching.  01/24/23: See Infusion progress note.   Patient had hypersensitivity reaction to Oxaliplatin. See progress note from 04/05/2023 @ 1400. Patient did not complete infusion.   Reglan [Metoclopramide] Nausea And Vomiting   Aspirin Rash   Penicillins Hives    Has patient had a PCN reaction causing immediate rash, facial/tongue/throat swelling, SOB or lightheadedness with hypotension: Yes Has patient had a PCN reaction causing severe rash involving mucus membranes or skin necrosis: No Has patient had a PCN reaction that required hospitalization No Has patient had a PCN reaction occurring within the last 10 years: No If all of the above answers are "NO", then may proceed with  Cephalosporin use.      Surgical History:  She  has a past surgical history that includes Cesarean section; Thyroidectomy, partial; Cholecystectomy; Abdominal hysterectomy; Flexible sigmoidoscopy (N/A, 02/19/2021); Esophagogastroduodenoscopy (egd) with propofol (N/A, 02/19/2021); biopsy (02/19/2021); IR IMAGING GUIDED PORT INSERTION (02/24/2021); IR US Guide Bx Asp/Drain (02/24/2021); Esophagogastroduodenoscopy (egd) with propofol (N/A, 06/19/2021); biopsy (06/19/2021); IR GASTROSTOMY TUBE MOD SED (07/01/2021); and IR GASTROSTOMY TUBE REMOVAL/REPAIR (08/21/2021). Family History:  Her family history includes Migraines in her sister; Non-Hodgkin's lymphoma in her half-brother and another family member; Pancreatic cancer (age of onset: 43) in an other family member.  REVIEW OF SYSTEMS  : All other systems reviewed and negative except where noted in the History of Present Illness.  PHYSICAL EXAM: There were no vitals taken for this visit. General Appearance: Well nourished, in no apparent distress. Head:   Normocephalic and atraumatic. Eyes:  sclerae anicteric,conjunctive pink  Respiratory: Respiratory effort normal, BS equal bilaterally without rales, rhonchi, wheezing. Cardio: RRR with no MRGs. Peripheral pulses intact.  Abdomen: Soft,  {BlankSingle:19197::"Flat","Obese","Non-distended"} ,active bowel sounds. {actendernessAB:27319} tenderness {anatomy; site abdomen:5010}. {BlankMultiple:19196::"Without guarding","With guarding","Without rebound","With rebound"}. No masses. Rectal: {acrectalexam:27461} Musculoskeletal: Full ROM, {PSY - GAIT AND STATION:22860} gait. {With/Without:304960234} edema. Skin:  Dry and intact without significant lesions or rashes Neuro: Alert and  oriented x4;  No focal deficits. Psych:  Cooperative. Normal mood and affect.    Doree Albee, PA-C 12:09 PM

## 2023-07-19 ENCOUNTER — Telehealth: Payer: Self-pay

## 2023-07-19 ENCOUNTER — Telehealth: Payer: Self-pay | Admitting: Physician Assistant

## 2023-07-19 ENCOUNTER — Ambulatory Visit: Payer: Medicaid Other | Admitting: Physician Assistant

## 2023-07-19 DIAGNOSIS — C250 Malignant neoplasm of head of pancreas: Secondary | ICD-10-CM

## 2023-07-19 NOTE — Telephone Encounter (Signed)
Good Morning Wendy Hoffman called stating that they had a request to go pick up patient from her home to come to her 9:00 appointment with you this morning. Driver was unable to get patient to the phone and door. Patient will not be coming to her 9:00 appointment.

## 2023-07-19 NOTE — Telephone Encounter (Signed)
The patient contacted [place] to report that she visited the emergency department at [facility] following a fall. She stated that she sustained a head injury, underwent a CT scan, and is currently experiencing pain. Additionally, she has bruising and soreness in her buttocks. The patient would like to inform the provider that she missed her OB/GYN appointment and has scheduled a new appointment for October.

## 2023-07-20 ENCOUNTER — Other Ambulatory Visit: Payer: Self-pay

## 2023-07-20 IMAGING — CT CT ABD-PELV W/ CM
2 of 5 series · 15 of 46 positions shown, 17 images · IV contrast (APPLIED)
Comparison: CT abdomen and pelvis 07/12/2021.

CLINICAL DATA: Restaging pancreas cancer.

EXAM:
CT ABDOMEN AND PELVIS WITH CONTRAST
TECHNIQUE: Multidetector CT imaging of the abdomen and pelvis was performed
using the standard protocol following bolus administration of
intravenous contrast.
CONTRAST:  60mL OMNIPAQUE IOHEXOL 350 MG/ML SOLN

[Series 2: abd pel w · axial · 0.72mm/px · z∈[+720,+1120]mm · 12 of 90 slices shown, 14 images]
[im 5/90  soft-tissue]
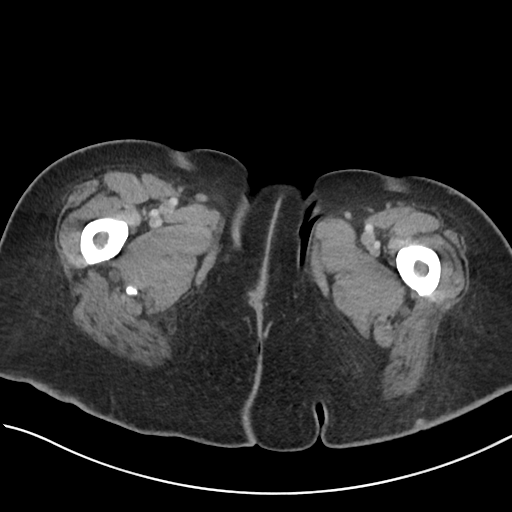
[im 5/90  bone]
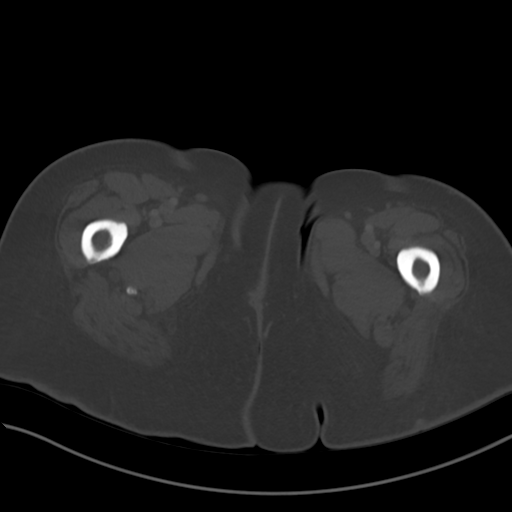
[im 15/90  soft-tissue]
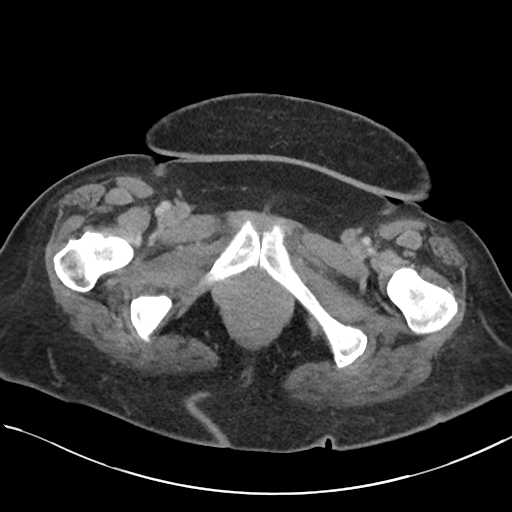
[im 19/90  soft-tissue]
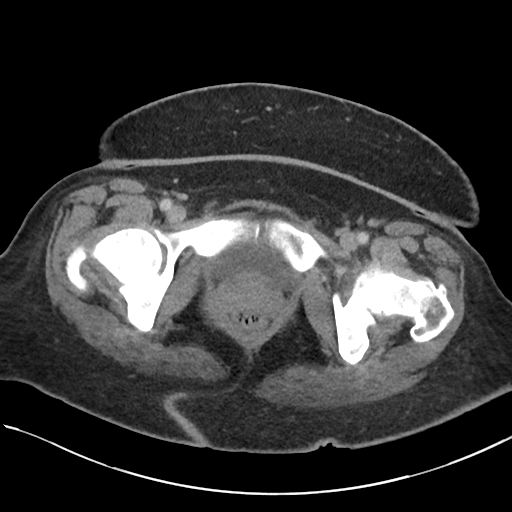
[im 29/90  soft-tissue]
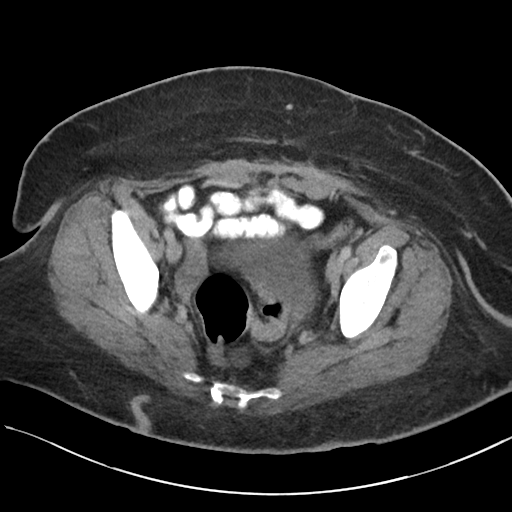
[im 33/90  soft-tissue]
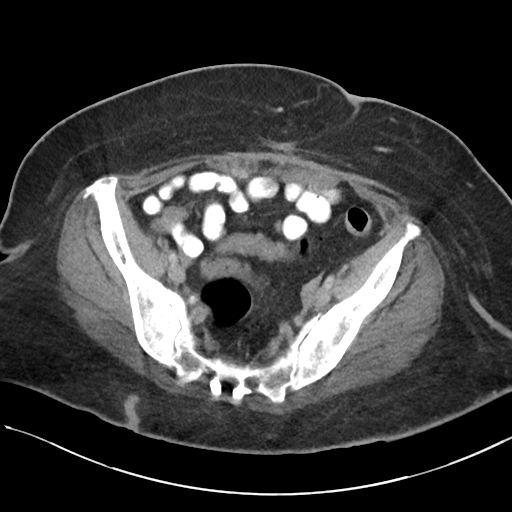
[im 43/90  soft-tissue]
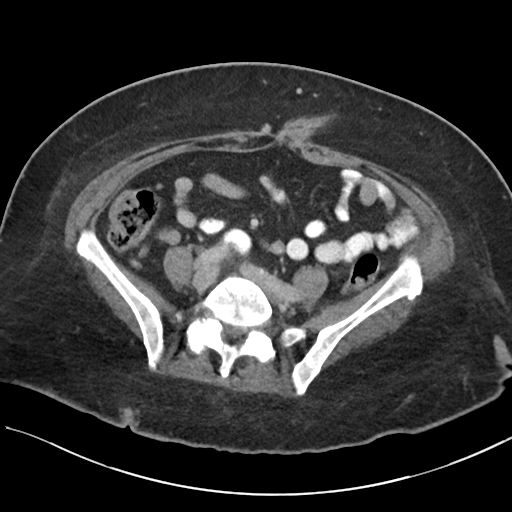
[im 47/90  soft-tissue]
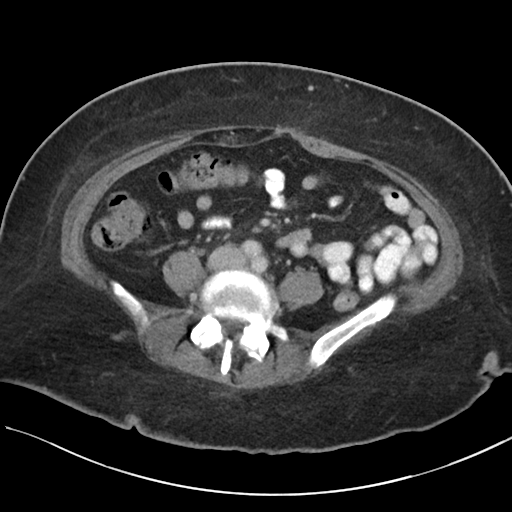
[im 57/90  soft-tissue]
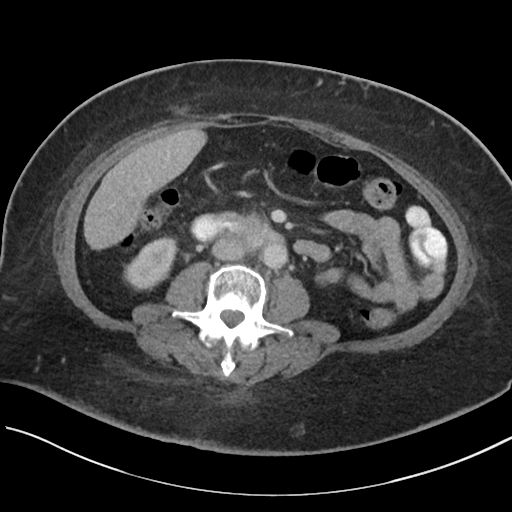
[im 61/90  soft-tissue]
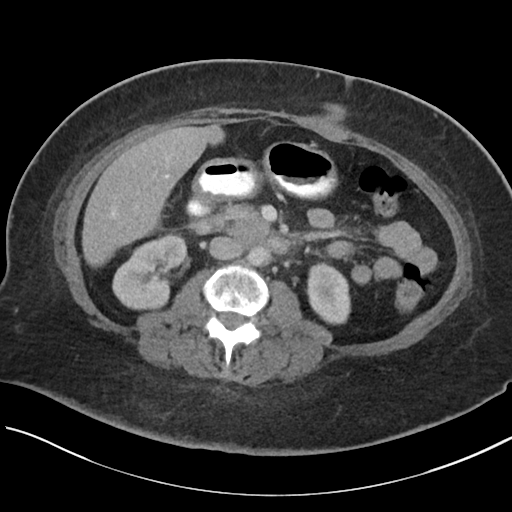
[im 61/90  bone]
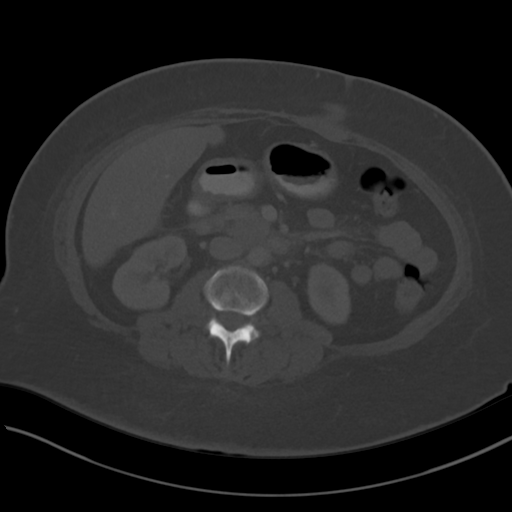
[im 71/90  soft-tissue]
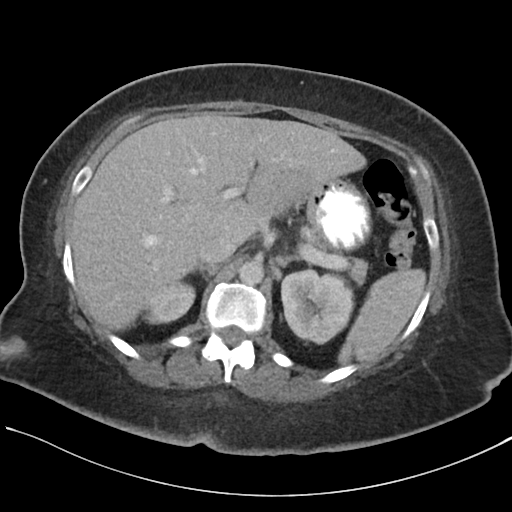
[im 75/90  soft-tissue]
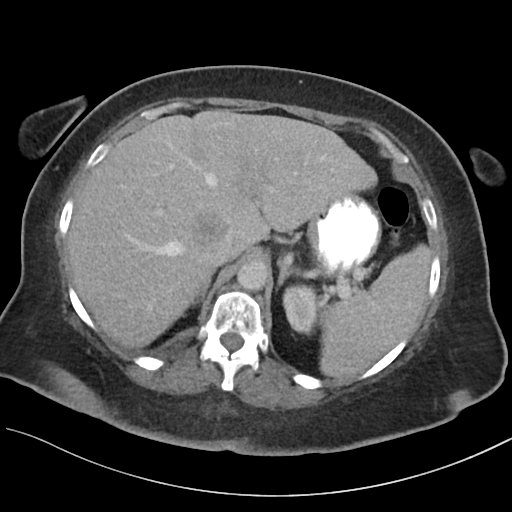
[im 85/90  soft-tissue]
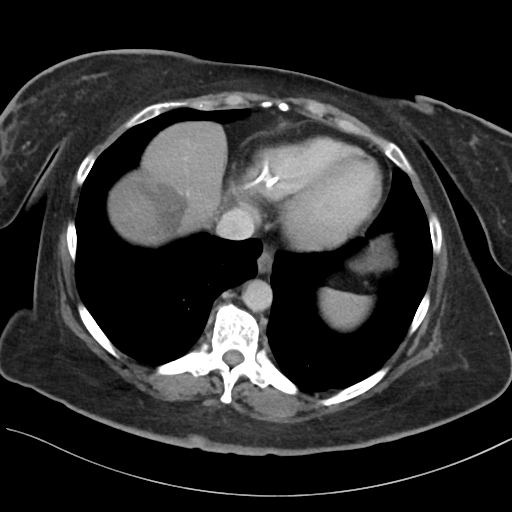

[Series 5: coronal · coronal · 0.75mm/px · 3 of 96 slices shown]
[im 32/96  soft-tissue]
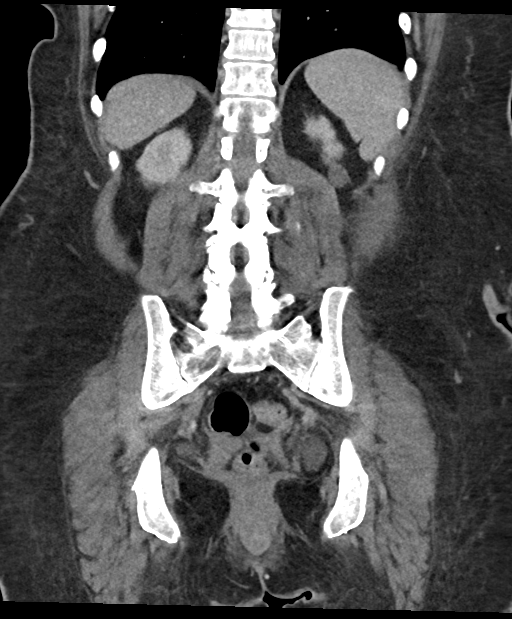
[im 43/96  soft-tissue]
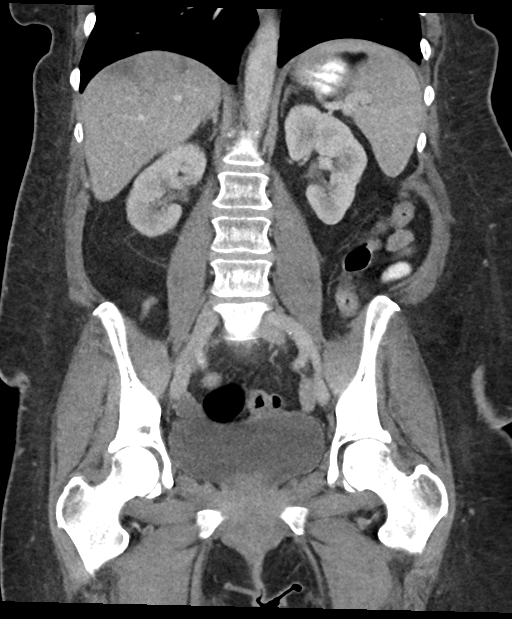
[im 53/96  soft-tissue]
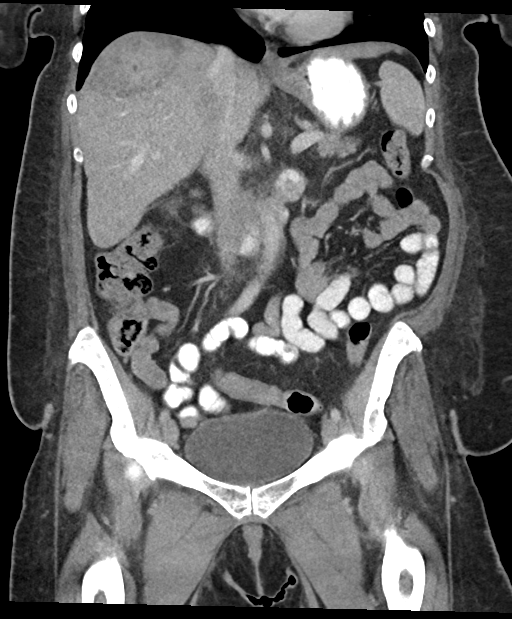

[15 of 46 positions shown; findings below may reference images not displayed]

FINDINGS: Lower chest: No pleural effusion or airspace consolidation. Mild
pleural nodularity is noted along the posterior lower lobes which
appears new from previous exam, image [DATE].

Hepatobiliary: Multiple liver metastases are again noted.

Lesion within lateral segment of left hepatic lobe measures 7.3 x
5.9 cm, image [DATE]. Previously 7.8 x 5.6 cm.

Mass within the right hepatic lobe measures 8.0 x 6.0 cm, image
[DATE]. Formally 7.8 by 4.5 cm.

Lesion within the central liver adjacent to the IVC measures 3.0 by
2.6 cm, image [DATE]. Previously 2.6 x 2.3 cm.

Within the posterior right hepatic lobe there is a lesion measuring
1.3 by 1.6 cm, image [DATE]. This appears new when compared with the
previous exam.

Previous cholecystectomy.  No bile duct dilatation.

Pancreas: Ill-defined hypoenhancing mass within head of pancreas
measures 3.7 by 2.4 cm, image 32/2. Previously 3.1 x 2.3 cm. No
signs of pancreatic inflammation or main duct dilatation.

Spleen: Normal in size without focal abnormality.

Adrenals/Urinary Tract: Normal adrenal glands. Posterior cortex left
kidney cyst measures 1.4 cm, image [DATE]. No hydronephrosis
identified bilaterally. Bladder is unremarkable.

Stomach/Bowel: Stomach appears normal. The appendix is visualized
and appears normal. No bowel wall thickening, inflammation or
distension.

Vascular/Lymphatic: Mild aortic atherosclerosis. No enlarged
abdominopelvic lymph nodes.

Reproductive: Status post hysterectomy. No adnexal masses.

Other: No significant free fluid or fluid collections. No peritoneal
nodule or mass.

Musculoskeletal: No acute or suspicious osseous findings. Stable
benign appearing sclerotic lesion within the posterior aspect of the
L3 vertebral body.
IMPRESSION: 1. Mild progression of multifocal liver metastases.
2. Mild increase in size of hypoenhancing mass within head of
pancreas.
3. New mild pleural nodularity along the posterior lower lobes.
Suspicious for metastasis. This is a nonspecific finding and
although this may be seen with pleural spread tumor this could also
be seen in the setting of recent pleural effusion, inflammation or
infection. Attention on follow-up imaging is advised.
4. Aortic Atherosclerosis ([id number]).

## 2023-07-22 ENCOUNTER — Ambulatory Visit: Payer: Medicaid Other | Admitting: Physician Assistant

## 2023-07-26 ENCOUNTER — Encounter (HOSPITAL_COMMUNITY): Payer: Self-pay | Admitting: Gastroenterology

## 2023-07-27 NOTE — Progress Notes (Signed)
Attempted to obtain medical history via telephone, unable to reach at this time. HIPAA compliant voicemail message left requesting return call to pre surgical testing department. 

## 2023-07-28 ENCOUNTER — Inpatient Hospital Stay (HOSPITAL_BASED_OUTPATIENT_CLINIC_OR_DEPARTMENT_OTHER): Payer: Medicaid Other | Admitting: Nurse Practitioner

## 2023-07-28 ENCOUNTER — Encounter: Payer: Self-pay | Admitting: Nurse Practitioner

## 2023-07-28 ENCOUNTER — Other Ambulatory Visit (HOSPITAL_BASED_OUTPATIENT_CLINIC_OR_DEPARTMENT_OTHER): Payer: Self-pay

## 2023-07-28 VITALS — BP 122/81 | HR 91 | Temp 98.1°F | Resp 20 | Ht 64.0 in | Wt 182.0 lb

## 2023-07-28 DIAGNOSIS — C250 Malignant neoplasm of head of pancreas: Secondary | ICD-10-CM

## 2023-07-28 NOTE — Progress Notes (Signed)
[facility] OFFICE PROGRESS NOTE   Diagnosis: Pancreas cancer   INTERVAL HISTORY:   Wendy Hoffman returns for follow-up.  She last had chemotherapy 05/25/2023.  Due to persistent nausea/vomiting and worsening abdominal pain she was referred to GI to consider an upper endoscopy.  She missed the appointment scheduled with gastroenterology on 07/19/2023 due to a fall requiring a visit in the emergency department.  She is tolerating liquids.  She continues to vomit about 5 minutes after eating any solid food.  She is nauseated fairly consistently.  She had a small bowel movement yesterday but overall has been experiencing constipation.  She continues to have abdominal pain especially at the upper mid abdomen.  Objective:  Vital signs in last 24 hours:  Blood pressure 122/81, pulse 91, temperature 98.1 F (36.7 C), resp. rate 20, height 5\' 4"  (1.626 m), weight 182 lb (82.6 kg), SpO2 98%.    Resp: Lungs clear bilaterally. Cardio: Regular rate and rhythm. GI: Marked tenderness over the mid upper abdomen with associated firm fullness.   Vascular: No leg edema. Neuro: Alert and oriented. Port-A-Cath without erythema.  Lab Results:  Lab Results  Component Value Date   WBC 5.7 07/06/2023   HGB 10.9 (L) 07/06/2023   HCT 32.8 (L) 07/06/2023   MCV 92.7 07/06/2023   PLT 290 07/06/2023   NEUTROABS 3.7 07/06/2023    Imaging:  No results found.  Medications: I have reviewed the patient's current medications.  Assessment/Plan: Pancreas cancer -CT abdomen/pelvis without contrast 02/16/2021-multiple large hypoechoic masses in the patient's liver consistent metastatic disease, ill-defined masslike area in the pancreatic head measuring approximate 4.7 cm, possible filling defect in the right ventricle. -EGD 02/19/2021-large infiltrative mass with bleeding in the second portion of the duodenum, appears to be arising near the ampulla, partially obstructive with friable mucosa.   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 -Flexible sigmoidoscopy 02/19/2021-diverticulosis in the sigmoid colon, nonbleeding external and internal hemorrhoids -Cardiac CT 02/20/2021-mass in the mid RV attached to the interventricular septum measuring 16 mm x 6 mm and concerning for metastatic tumor -MRI abdomen with/without contrast 02/21/2021-mass in the posterior pancreatic head measuring 4.3 x 3.8 cm with obstruction of the central pancreatic duct and diffuse dilatation up to 6 mm consistent with pancreatic adenocarcinoma, liver lesions the largest mass of the central left lobe measuring 9.9 x 9.2 cm consistent with hepatic metastatic disease,  hepatomegaly. -Ultrasound-guided biopsy of a left liver lesion 02/24/2021-adenocarcinoma, morphologically similar to the duodenal biopsy; microsatellite stable, tumor mutation burden 1 -Negative genetic testing -Palliative radiation to the duodenal mass 02/25/2021-03/06/2021 -Cycle 1 FOLFOX 03/17/2021 -Cycle 2 FOLFOX 04/01/2021 -Cycle 3 FOLFIRINOX 04/14/2021 -Cycle 4 FOLFIRINOX 04/27/2021, Udenyca added -Cycle 5 FOLFIRINOX 05/12/2021, Udenyca -MRI abdomen 05/23/2021-decrease in size of pancreas mass and hepatic lesions, no progressive disease -Cycle 6 FOLFIRINOX 05/27/2021 -CT 06/17/2021-multiple liver metastases similar to her prior exam.  Pancreas mass not significantly changed. -CT 06/26/2021-stable pancreas mass, no significant change in size and number of known liver metastases. -CT abdomen/pelvis 07/12/2021-new gastrostomy tube.  Multiple liver masses slightly decreased in size from prior exam.  Pancreas head mass unchanged. -Cycle 1 gemcitabine/Abraxane 07/30/2021 -Cycle 2 gemcitabine/Abraxane 08/13/2021 -Cycle 3 gemcitabine 08/27/2021, Abraxane held due to significant bilateral toe pain, question neuropathy -Cycle 4 gemcitabine, Abraxane held due to neuropathy, 09/10/2021 -Cycle 5 gemcitabine, Abraxane held due to neuropathy,  09/24/2021 -Cycle 6 gemcitabine, Abraxane held due to neuropathy 10/16/2021 -CTs 10/29/2021-mild progression of multifocal liver metastases; mild increase in size of hypoenhancing mass within the  head of the pancreas; new mild pleural nodularity along the posterior lower lobes -Cycle 7 gemcitabine/Abraxane 11/03/2021 -Cycle 8 gemcitabine/Abraxane 11/23/2021 -Cycle 9 gemcitabine/Abraxane 12/10/2021 -Cycle 10 Gemcitabine/Abraxane 12/24/2021 -CT abdomen/pelvis 01/04/2022-decrease size of pancreas head mass, no change in multifocal hepatic metastases -Cycle 11 gemcitabine/Abraxane 01/07/2022 -Cycle 12 gemcitabine/Abraxane 02/04/2022 -Cycle 13 Gemcitabine/Abraxane 02/18/2022 -Cycle 14 gemcitabine/Abraxane 03/11/2022 -Cycle 15 gemcitabine/Abraxane 03/29/2022 -Cycle 16 gemcitabine/Abraxane 04/16/2022 -CT abdomen/pelvis 04/22/2022-mixed response in the liver, some lesions larger, some smaller, no change in pancreas head mass, no evidence of metastatic disease elsewhere in the abdomen or pelvis -Cycle 17 gemcitabine/Abraxane 05/17/2022 -Cycle 18 Gemcitabine/Abraxane 06/01/2022 -Cycle 19 gemcitabine/Abraxane 06/18/2022 -Cycle 20 gemcitabine/Abraxane 07/05/2022 -Cycle 21 gemcitabine/Abraxane 08/09/2022 -Cycle 22 gemcitabine/Abraxane 08/23/2022 -CT abdomen/pelvis 09/01/2022-stable liver metastases, difficult to visualize the pancreas head mass, no evidence of disease progression -Cycle 23 gemcitabine/Abraxane 09/06/2022 -Cycle 24 gemcitabine/Abraxane 09/20/2022 -Cycle 25 gemcitabine/Abraxane 10/18/2022 -Cycle 26 gemcitabine/Abraxane 11/01/2022 -Cycle 27 gemcitabine/Abraxane 11/29/2022 -CT abdomen/pelvis 12/14/2022-increased size of liver metastases, less well-defined pancreas mass, 4 mm right lung nodule -Cycle 1 FOLFIRINOX 01/10/2023 -Cycle 2 FOLFIRINOX 01/24/2023, 5-FU dose reduced secondary to mucositis, chemotherapy plan dose reduce for weight loss -Develop pruritus during the oxaliplatin infusion, initial improvement  with Benadryl and Solu-Medrol, oxaliplatin was resumed and the pruritus recurred.  Oxaliplatin was discontinued. -Chemotherapy held 02/07/2023 due to diarrhea, nausea/vomiting, dehydration -Cycle 3 FOLFIRINOX 03/07/2023 -Cycle 4 FOLFIRINOX 03/22/2023, 5-FU dose reduction due to mucositis, chemotherapy plan dose reduction for weight loss -CTs 03/30/2021-numerous bulky liver lesions, majority are slightly diminished in size though some lesions are unchanged or slightly enlarged. -Cycle 5 FOLFIRINOX 04/05/2023 -04/05/2023 reaction to oxaliplatin with dyspnea and pruritus -FOLFIRI every 3 weeks beginning 04/27/2023 -FOLFIRI 05/25/2023 -CTs 07/04/2023-widespread hepatic metastatic disease again noted, some of the lesions superiorly in the right lobe may be mildly progressive, direct comparison limited by lack of contrast.  Grossly unchanged pancreatic head mass.  Constipation. 2.  Iron deficiency anemia-improved -Feraheme 510 mg 02/20/2021 3.  Protein calorie malnutrition 4.  Possible right ventricular filling defect on noncontrast CT scan MRI cardiac morphology 02/21/2021-mass in the mid RV attached to the interventricular septum measures 16 mm x 6 mm.  Mass appears heterogeneous with area of enhancement on LGE imaging.  Mass is concerning for metastatic tumor.  Normal LV size and systolic function.  Normal RV size and systolic function. 5.  Hypertension 6.  History of CVA 7.  Hyperlipidemia 8.  Hypothyroidism 9.  Morbid obesity 10.  Asthma 11.  Migraines 12.  Pain secondary to #1 13.  Port-A-Cath placement 02/25/2021 14.  Hypokalemia 04/01/2021-started a potassium supplement 15.  Hospital admission 06/17/2021-intractable nausea and vomiting EGD 06/19/2021-diffuse gastritis, nonobstructing duodenal mass, biopsy of stomach-no malignancy or H. pylori, duodenal mass-adenocarcinoma 16.  Hospital admission 07/12/2021 with nausea/vomiting, abdominal pain, and hypokalemia 17.  Percutaneous gastrostomy tube placement  07/01/2021 18.  07/21/2022 bilateral lower extremity venous Doppler study-negative for DVT.  Disposition: Wendy Hoffman appears unchanged.  She continues to have increased upper abdominal pain and nausea/vomiting with intake of any solids.  She is able to tolerate liquids.  We remain concerned for a gastric outlet obstruction.  She missed her appointment with gastroenterology.  We will contact them to reschedule.  Chemotherapy remains on hold.  She understands to proceed to the emergency department if she is unable to tolerate liquids.  She will return for follow-up in 2 weeks.    Lonna Cobb ANP/GNP-BC   07/28/2023  1:17 PM

## 2023-08-02 ENCOUNTER — Other Ambulatory Visit (HOSPITAL_BASED_OUTPATIENT_CLINIC_OR_DEPARTMENT_OTHER): Payer: Self-pay

## 2023-08-02 ENCOUNTER — Other Ambulatory Visit: Payer: Self-pay | Admitting: Nurse Practitioner

## 2023-08-02 DIAGNOSIS — C259 Malignant neoplasm of pancreas, unspecified: Secondary | ICD-10-CM

## 2023-08-02 DIAGNOSIS — C250 Malignant neoplasm of head of pancreas: Secondary | ICD-10-CM

## 2023-08-02 MED ORDER — ZOLPIDEM TARTRATE 10 MG PO TABS
10.0000 mg | ORAL_TABLET | Freq: Every evening | ORAL | 0 refills | Status: DC | PRN
Start: 2023-08-02 — End: 2023-09-05
  Filled 2023-08-02: qty 15, 30d supply, fill #0
  Filled 2023-08-09: qty 15, 15d supply, fill #0

## 2023-08-02 MED ORDER — OXYCODONE HCL 10 MG PO TABS
10.0000 mg | ORAL_TABLET | ORAL | 0 refills | Status: DC | PRN
Start: 2023-08-02 — End: 2023-08-09
  Filled 2023-08-02: qty 60, 5d supply, fill #0

## 2023-08-02 MED ORDER — MORPHINE SULFATE (CONCENTRATE) 10 MG/0.5ML PO SOLN
20.0000 mg | Freq: Three times a day (TID) | ORAL | 0 refills | Status: DC | PRN
Start: 2023-08-02 — End: 2023-08-09
  Filled 2023-08-02: qty 30, 7d supply, fill #0

## 2023-08-04 ENCOUNTER — Encounter (HOSPITAL_COMMUNITY): Payer: Self-pay | Admitting: Gastroenterology

## 2023-08-04 ENCOUNTER — Other Ambulatory Visit: Payer: Self-pay

## 2023-08-04 ENCOUNTER — Ambulatory Visit (HOSPITAL_COMMUNITY)
Admission: RE | Admit: 2023-08-04 | Discharge: 2023-08-04 | Disposition: A | Discharge status: Home / Self Care | Payer: Medicaid Other | Attending: Gastroenterology | Admitting: Gastroenterology

## 2023-08-04 ENCOUNTER — Encounter (HOSPITAL_COMMUNITY)
Admission: RE | Disposition: A | Discharge status: Home / Self Care | Payer: Self-pay | Source: Home / Self Care | Attending: Gastroenterology

## 2023-08-04 ENCOUNTER — Ambulatory Visit (HOSPITAL_BASED_OUTPATIENT_CLINIC_OR_DEPARTMENT_OTHER): Payer: Medicaid Other | Admitting: Certified Registered"

## 2023-08-04 ENCOUNTER — Ambulatory Visit (HOSPITAL_COMMUNITY): Payer: Self-pay | Admitting: Certified Registered"

## 2023-08-04 ENCOUNTER — Ambulatory Visit (HOSPITAL_COMMUNITY): Payer: Medicaid Other

## 2023-08-04 DIAGNOSIS — Z87891 Personal history of nicotine dependence: Secondary | ICD-10-CM | POA: Diagnosis not present

## 2023-08-04 DIAGNOSIS — E039 Hypothyroidism, unspecified: Secondary | ICD-10-CM | POA: Insufficient documentation

## 2023-08-04 DIAGNOSIS — R627 Adult failure to thrive: Secondary | ICD-10-CM | POA: Diagnosis not present

## 2023-08-04 DIAGNOSIS — K2289 Other specified disease of esophagus: Secondary | ICD-10-CM

## 2023-08-04 DIAGNOSIS — B3781 Candidal esophagitis: Secondary | ICD-10-CM | POA: Insufficient documentation

## 2023-08-04 DIAGNOSIS — R1115 Cyclical vomiting syndrome unrelated to migraine: Secondary | ICD-10-CM | POA: Diagnosis not present

## 2023-08-04 DIAGNOSIS — K219 Gastro-esophageal reflux disease without esophagitis: Secondary | ICD-10-CM | POA: Insufficient documentation

## 2023-08-04 DIAGNOSIS — Z6830 Body mass index (BMI) 30.0-30.9, adult: Secondary | ICD-10-CM | POA: Insufficient documentation

## 2023-08-04 DIAGNOSIS — G43909 Migraine, unspecified, not intractable, without status migrainosus: Secondary | ICD-10-CM | POA: Insufficient documentation

## 2023-08-04 DIAGNOSIS — K3189 Other diseases of stomach and duodenum: Secondary | ICD-10-CM | POA: Insufficient documentation

## 2023-08-04 DIAGNOSIS — Z8541 Personal history of malignant neoplasm of cervix uteri: Secondary | ICD-10-CM | POA: Insufficient documentation

## 2023-08-04 DIAGNOSIS — Z859 Personal history of malignant neoplasm, unspecified: Secondary | ICD-10-CM | POA: Diagnosis not present

## 2023-08-04 DIAGNOSIS — C259 Malignant neoplasm of pancreas, unspecified: Secondary | ICD-10-CM | POA: Insufficient documentation

## 2023-08-04 DIAGNOSIS — J4489 Other specified chronic obstructive pulmonary disease: Secondary | ICD-10-CM | POA: Diagnosis not present

## 2023-08-04 DIAGNOSIS — I10 Essential (primary) hypertension: Secondary | ICD-10-CM | POA: Insufficient documentation

## 2023-08-04 DIAGNOSIS — C250 Malignant neoplasm of head of pancreas: Secondary | ICD-10-CM

## 2023-08-04 DIAGNOSIS — R112 Nausea with vomiting, unspecified: Secondary | ICD-10-CM | POA: Diagnosis present

## 2023-08-04 HISTORY — PX: ENTEROSCOPY: SHX5533

## 2023-08-04 HISTORY — PX: ESOPHAGEAL BRUSHING: SHX6842

## 2023-08-04 HISTORY — PX: BIOPSY: SHX5522

## 2023-08-04 HISTORY — PX: DUODENAL STENT PLACEMENT: SHX5541

## 2023-08-04 SURGERY — ENTEROSCOPY
Anesthesia: General

## 2023-08-04 MED ORDER — ONDANSETRON HCL 4 MG/2ML IJ SOLN
INTRAMUSCULAR | Status: AC
  Filled 2023-08-04: qty 2

## 2023-08-04 MED ORDER — FENTANYL CITRATE (PF) 100 MCG/2ML IJ SOLN
INTRAMUSCULAR | Status: AC
  Filled 2023-08-04: qty 2

## 2023-08-04 MED ORDER — MIDAZOLAM HCL 2 MG/2ML IJ SOLN
INTRAMUSCULAR | Status: AC
  Filled 2023-08-04: qty 2

## 2023-08-04 MED ORDER — MIDAZOLAM HCL 5 MG/5ML IJ SOLN
INTRAMUSCULAR | Status: DC | PRN
  Administered 2023-08-04: 2 mg via INTRAVENOUS

## 2023-08-04 MED ORDER — DEXAMETHASONE SODIUM PHOSPHATE 10 MG/ML IJ SOLN
INTRAMUSCULAR | Status: DC | PRN
  Administered 2023-08-04: 8 mg via INTRAVENOUS

## 2023-08-04 MED ORDER — SODIUM CHLORIDE 0.9 % IV SOLN: INTRAVENOUS | Status: DC

## 2023-08-04 MED ORDER — FENTANYL CITRATE (PF) 100 MCG/2ML IJ SOLN
25.0000 ug | Freq: Once | INTRAMUSCULAR | Status: AC
  Administered 2023-08-04: 25 ug via INTRAVENOUS

## 2023-08-04 MED ORDER — ONDANSETRON HCL 4 MG/2ML IJ SOLN
INTRAMUSCULAR | Status: DC | PRN
  Administered 2023-08-04: 4 mg via INTRAVENOUS

## 2023-08-04 MED ORDER — PROPOFOL 10 MG/ML IV BOLUS
INTRAVENOUS | Status: AC
  Filled 2023-08-04: qty 20

## 2023-08-04 MED ORDER — FENTANYL CITRATE (PF) 100 MCG/2ML IJ SOLN
INTRAMUSCULAR | Status: DC | PRN
  Administered 2023-08-04: 25 ug via INTRAVENOUS
  Administered 2023-08-04: 50 ug via INTRAVENOUS
  Administered 2023-08-04: 25 ug via INTRAVENOUS

## 2023-08-04 MED ORDER — PROPOFOL 10 MG/ML IV BOLUS
INTRAVENOUS | Status: DC | PRN
  Administered 2023-08-04: 150 mg via INTRAVENOUS

## 2023-08-04 MED ORDER — SUCCINYLCHOLINE CHLORIDE 200 MG/10ML IV SOSY
PREFILLED_SYRINGE | INTRAVENOUS | Status: DC | PRN
Start: 2023-08-04 — End: 2023-08-04
  Administered 2023-08-04: 80 mg via INTRAVENOUS

## 2023-08-04 MED ORDER — LACTATED RINGERS IV SOLN
INTRAVENOUS | Status: AC | PRN
  Administered 2023-08-04: 1000 mL via INTRAVENOUS

## 2023-08-04 MED ORDER — LIDOCAINE HCL (CARDIAC) PF 100 MG/5ML IV SOSY
PREFILLED_SYRINGE | INTRAVENOUS | Status: DC | PRN
  Administered 2023-08-04: 60 mg via INTRAVENOUS

## 2023-08-04 MED ORDER — ONDANSETRON HCL 4 MG/2ML IJ SOLN
4.0000 mg | Freq: Once | INTRAMUSCULAR | Status: AC
  Administered 2023-08-04: 4 mg via INTRAVENOUS

## 2023-08-04 NOTE — Progress Notes (Addendum)
Patient evaluated in the postprocedural recovery area with care partner. We went over the results of the endoscopy (see probation note for full details) with hope that the enteral stent we have placed to the 2 areas of narrowing will be helpful in regards to her symptoms of nausea and vomiting in the long-term. She was doing okay with just some mild coughing but subsequently had a significant episode of vomitus of bilious fluid. She had complaints of pain 10 out of 10 even though she had started the day 15 out of 10. She did not take any of her normal long-acting pain medications over the course of today so I am worried that there is some issues as a result of her not having any of her pain medications. She received 100 mcg of fentanyl during her procedure as well as Decadron and further antiemetics. We will see if the patient will be able to be discharged home which is the plan of action currently versus because she had such significant amount of pain preprocedure whether she will need to be brought in for post procedure observation and pain management and antiemetic management. Will reevaluate and decide in the coming minutes.   Corliss Parish, MD [facility] Office # [phone]   Further addendum's In the setting of giving the patient a bit of pain medication and some further antiemetics she is stable and willing to go home.  Her pain is at the level of baseline of where she was when she came into the hospital for her procedure today.  We had difficulty with trying to get her discharge as her care partner did not have the ability to get over here but now through the hospital administrator we been able to locate the ability for her to be transported out.  She will be discharged.  She knows if things worsen or progress to let us know and should be directed to come in the hospital of this case.  When she heads home should be able to take her evening pain  medications and medicines as outlined.  Hopefully she will start tolerating her oral intake further.   Corliss Parish, MD [facility] Office # [phone]

## 2023-08-04 NOTE — Progress Notes (Signed)
Patient requested DC home but requested some IV pain medication to assist her pain control before she heads home. Spoke with Dr. Meridee Score and ok to given 25 mcg Fentanyl IV monitor x 30 minutes and DC home.

## 2023-08-04 NOTE — Anesthesia Postprocedure Evaluation (Signed)
Anesthesia Post Note  Patient: Wendy Hoffman  Procedure(s) Performed: ENTEROSCOPY ESOPHAGEAL BRUSHING BIOPSY DUODENAL STENT PLACEMENT     Patient location during evaluation: Endoscopy Anesthesia Type: General Level of consciousness: awake Pain management: pain level controlled Vital Signs Assessment: post-procedure vital signs reviewed and stable Respiratory status: spontaneous breathing, nonlabored ventilation and respiratory function stable Cardiovascular status: blood pressure returned to baseline and stable Postop Assessment: no apparent nausea or vomiting Anesthetic complications: no   No notable events documented.  Last Vitals:  Vitals:   08/04/23 1711 08/04/23 1720  BP: 108/67 114/72  Pulse: 93 89  Resp: 15 11  Temp:    SpO2: 96% 96%    Last Pain:  Vitals:   08/04/23 1720  TempSrc:   PainSc: 6                  Anoop Hemmer P Jazmina Muhlenkamp

## 2023-08-04 NOTE — Op Note (Signed)
[facility] Patient Name: Wendy Hoffman Procedure Date: 08/04/2023 MRN: [id number] Attending MD: Corliss Parish , MD, [id number] Date of Birth: Aug 26, 1966 CSN: [id number] Age: 57 Admit Type: Outpatient Procedure:                Small bowel enteroscopy Indications:              Unexplained generalized abdominal distress/pain,                            Failure to respond to treatment, Nausea with                            vomiting, Persistent vomiting of unknown cause,                            Weight loss, Failure to thrive, Personal history of                            malignant neoplasm Providers:                Corliss Parish, MD, Norman Clay, RN, Leanne Lovely, Technician Referring MD:             Leighton Roach. Sherrill Medicines:                General Anesthesia Complications:            No immediate complications. Estimated Blood Loss:     Estimated blood loss was minimal. Procedure:                Pre-Anesthesia Assessment:                           - Prior to the procedure, a History and Physical                            was performed, and patient medications and                            allergies were reviewed. The patient's tolerance of                            previous anesthesia was also reviewed. The risks                            and benefits of the procedure and the sedation                            options and risks were discussed with the patient.                            All questions were answered, and informed consent                            was obtained.  Prior Anticoagulants: The patient has                            taken no anticoagulant or antiplatelet agents. ASA                            Grade Assessment: III - A patient with severe                            systemic disease. After reviewing the risks and                            benefits, the patient was deemed in satisfactory                             condition to undergo the procedure.                           After obtaining informed consent, the endoscope was                            passed under direct vision. Throughout the                            procedure, the patient's blood pressure, pulse, and                            oxygen saturations were monitored continuously. The                            [id number] ([id number]) Olympus therapeutic endoscope                            was introduced through the mouth and advanced to                            the fourth part of duodenum. The small bowel                            enteroscopy was accomplished without difficulty.                            The patient tolerated the procedure. Scope In: Scope Out: Findings:      White nummular lesions were noted in the entire esophagus. Brushings       were obtained in the entire esophagus.      The Z-line was irregular and was found 39 cm from the incisors.      Retained fluid was found in the gastric body. Suction via Endoscope was       performed with removal of 400 mL.      Patchy moderately erythematous mucosa without bleeding was found in the       entire examined stomach. Biopsies were taken with a cold forceps for       histology and Helicobacter pylori testing.  Normal mucosa was found in the duodenal bulb.      Diffuse severe mucosal changes characterized by congestion, granularity,       scalloping and altered texture were found in the second portion of the       duodenum and in the third portion of the duodenum. There seems to be an       abrupt 2 areas/regions of narrowing with significant pressure required       to traverse it with the therapeutic endoscope. These particular areas       were in the middle of the second portion and at the junction of the       2nd-3rd portion. Functional obstruction seems likely from these       particular regions. Biopsies were taken with a cold forceps for        histology purposes, though overt cancer infiltration does not seem to be       present based on the endoscopic appearance. In an effort of trying to       help the patient's recurring symptoms we decided to move forward with       attempt at enteral stenting. Placement of a long 0.035 inch Soft Jagwire       was attempted. This passed successfully deep into the jejunum. Using the       markers of my endoscope and the 2 specific regions of narrowing, I moved       forward with stenting. This area of D2/D3 was stented with a 22 mm x 9       cm WallFlex stent under fluoroscopic and endoscopic guidance.      Normal mucosa was found in the fourth portion of the duodenum. Impression:               - White nummular lesions in esophageal mucosa.                            Brushings performed to rule out Candida.                           - Z-line irregular, 39 cm from the incisors.                           - Retained gastric fluid with removal of 400 mL.                           - Erythematous mucosa in the stomach. Biopsied.                           - Normal mucosa was found in the duodenal bulb.                           - Mucosal changes in D2/D3 with 2 areas of                            narrowing requiring pressure to allow scope passage                            of the therapeutic endoscope within D2 and D2/D3  junction. Biopsied mucosa. Decision made to attempt                            enteral stenting, prosthesis placed.                           - Normal mucosa was found in the fourth portion of                            the duodenum. Recommendation:           - The patient will be observed post-procedure,                            until all discharge criteria are met.                           - Discharge patient to home.                           - Patient has a contact number available for                            emergencies. The signs and symptoms  of potential                            delayed complications were discussed with the                            patient. Return to normal activities tomorrow.                            Written discharge instructions were provided to the                            patient.                           - Clear liquid diet today. Advance diet to full                            liquids tomorrow. If able to tolerate low residue                            diet thereafter would be ideal in effort of                            decreasing stent migration and stent blockage                            (enteral stent diet package given to patient).                           - Await pathology results.                           -  Continue present medications otherwise.                           - I am hopeful that this stenting will allow                            patient to have some improvement in her symptoms of                            recurrent nausea and vomiting. There certainly was                            evidence of some narrowing in this region. I do                            have concerns however based on the significance of                            her symptoms and her pain whether she may have                            gastroparesis also playing some role with her                            symptoms. Time will tell. Although further                            endoscopic stenting could be done even more                            proximally, I am not sure that it is needed at this                            time as the therapeutic endoscope was able to                            traverse deeply through the stent after he had been                            placed.                           - The findings and recommendations were discussed                            with the patient.                           - The findings and recommendations were discussed                             with the designated responsible adult. Procedure Code(s):        --- Professional ---  16109, Small intestinal endoscopy, enteroscopy                            beyond second portion of duodenum, not including                            ileum; with transendoscopic stent placement                            (includes predilation)                           44361, Small intestinal endoscopy, enteroscopy                            beyond second portion of duodenum, not including                            ileum; with biopsy, single or multiple                           74360, 26, Intraluminal dilation of strictures                            and/or obstructions (eg, esophagus), radiological                            supervision and interpretation Diagnosis Code(s):        --- Professional ---                           K22.89, Other specified disease of esophagus                           K31.89, Other diseases of stomach and duodenum                           R10.84, Generalized abdominal pain                           R11.2, Nausea with vomiting, unspecified                           R11.15, Cyclical vomiting syndrome unrelated to                            migraine                           R63.4, Abnormal weight loss                           R62.7, Adult failure to thrive                           Z85.9, Personal history of malignant neoplasm,  unspecified CPT copyright 2022 [employer]. All rights reserved. The codes documented in this report are preliminary and upon coder review may  be revised to meet current compliance requirements. Corliss Parish, MD 08/04/2023 4:12:29 PM Number of Addenda: 0

## 2023-08-04 NOTE — Transfer of Care (Signed)
Immediate Anesthesia Transfer of Care Note  Patient: Wendy Hoffman  Procedure(s) Performed: ENTEROSCOPY ESOPHAGEAL BRUSHING BIOPSY DUODENAL STENT PLACEMENT  Patient Location: PACU and Endoscopy Unit  Anesthesia Type:General  Level of Consciousness: awake, alert , oriented, and patient cooperative  Airway & Oxygen Therapy: Patient Spontanous Breathing and Patient connected to face mask oxygen  Post-op Assessment: Report given to RN, Post -op Vital signs reviewed and stable, and Patient moving all extremities  Post vital signs: Reviewed and stable  Last Vitals:  Vitals Value Taken Time  BP 147/80 08/04/23 1610  Temp    Pulse 101 08/04/23 1612  Resp 21 08/04/23 1612  SpO2 95 % 08/04/23 1612  Vitals shown include unfiled device data.  Last Pain:  Vitals:   08/04/23 1302  TempSrc: Temporal  PainSc: 10-Worst pain ever         Complications: No notable events documented.

## 2023-08-04 NOTE — Care Management (Signed)
AC notified this RNCM re: assistance with transportation home. RN Tresa Endo reports patient was [facility] however they are closed and patient now needs ride home. Patient currently at [facility] and has no scheduled ride home.  Patient received safe transport ride to home address. Per [facility] [employer] will pick patient up within 10 mins at main entrance. RN Tresa Endo notified.  No additional TOC needs

## 2023-08-04 NOTE — H&P (Addendum)
GASTROENTEROLOGY PROCEDURE H&P NOTE   Primary Care Physician: [name], FNP  HPI: Wendy Hoffman is a 57 y.o. female who presents for EGD/Enteroscopy for evaluation of nausea and vomiting in setting of pancreatic cancer.  Past Medical History:  Diagnosis Date   Anemia    Anxiety    ARF (acute renal failure) (HCC) 06/18/2021   Arthritis    Asthma    Cervical ca (HCC)    H/O: hysterectomy    High cholesterol    Hypertension    Hypothyroidism    Insomnia    Medical history non-contributory    Migraine    Morbid obesity (HCC)    Pancreatic cancer (HCC) 02/2021   Shortness of breath    Stroke [facility])    Thyroid disease    Past Surgical History:  Procedure Laterality Date   ABDOMINAL HYSTERECTOMY     BIOPSY  02/19/2021   Procedure: BIOPSY;  Surgeon: Napoleon Form, MD;  Location: [facility];  Service: Endoscopy;;   BIOPSY  06/19/2021   Procedure: BIOPSY;  Surgeon: Kathi Der, MD;  Location: [facility];  Service: Gastroenterology;;   CESAREAN SECTION     3 occasions   CHOLECYSTECTOMY     ESOPHAGOGASTRODUODENOSCOPY (EGD) WITH PROPOFOL N/A 02/19/2021   Procedure: ESOPHAGOGASTRODUODENOSCOPY (EGD) WITH PROPOFOL;  Surgeon: Napoleon Form, MD;  Location: [facility];  Service: Endoscopy;  Laterality: N/A;   ESOPHAGOGASTRODUODENOSCOPY (EGD) WITH PROPOFOL N/A 06/19/2021   Procedure: ESOPHAGOGASTRODUODENOSCOPY (EGD) WITH PROPOFOL;  Surgeon: Kathi Der, MD;  Location: [facility];  Service: Gastroenterology;  Laterality: N/A;   FLEXIBLE SIGMOIDOSCOPY N/A 02/19/2021   Procedure: FLEXIBLE SIGMOIDOSCOPY;  Surgeon: Napoleon Form, MD;  Location: [facility];  Service: Endoscopy;  Laterality: N/A;   IR GASTROSTOMY TUBE MOD SED  07/01/2021   IR GASTROSTOMY TUBE REMOVAL  08/21/2021   IR IMAGING GUIDED PORT INSERTION  02/24/2021   IR US GUIDE BX ASP/DRAIN  02/24/2021   THYROIDECTOMY, PARTIAL     Goiter   No current facility-administered medications  for this encounter.   No current facility-administered medications for this encounter. Allergies  Allergen Reactions   Blueberry [Vaccinium Angustifolium] Anaphylaxis   Mango Flavor Anaphylaxis   Oxaliplatin Shortness Of Breath and Itching    Patient has reacted to oxaliplatin with minor symptoms of neuropathy and itching.  01/24/23: See Infusion progress note.   Patient had hypersensitivity reaction to Oxaliplatin. See progress note from 04/05/2023 @ 1400. Patient did not complete infusion.   Reglan [Metoclopramide] Nausea And Vomiting   Aspirin Rash   Penicillins Hives    Has patient had a PCN reaction causing immediate rash, facial/tongue/throat swelling, SOB or lightheadedness with hypotension: Yes Has patient had a PCN reaction causing severe rash involving mucus membranes or skin necrosis: No Has patient had a PCN reaction that required hospitalization No Has patient had a PCN reaction occurring within the last 10 years: No If all of the above answers are "NO", then may proceed with Cephalosporin use.    Family History  Problem Relation Age of Onset   Migraines Sister    Non-Hodgkin's lymphoma Other        maternal half brother's grandson   Non-Hodgkin's lymphoma Half-Brother        maternal half brother; dx 47s or 22s   Pancreatic cancer Other 37       MGF's niece   Social History   Socioeconomic History   Marital status: Single    Spouse name: Not on file   Number  of children: Not on file   Years of education: Not on file   Highest education level: Not on file  Occupational History   Not on file  Tobacco Use   Smoking status: Former    Types: Cigarettes   Smokeless tobacco: Never  Vaping Use   Vaping status: Never Used  Substance and Sexual Activity   Alcohol use: No   Drug use: No   Sexual activity: Not on file  Other Topics Concern   Not on file  Social History Narrative   Not on file   Social Determinants of Health   Financial Resource Strain:  Medium Risk (12/14/2022)   Overall Financial Resource Strain (CARDIA)    Difficulty of Paying Living Expenses: Somewhat hard  Food Insecurity: Food Insecurity Present (12/14/2022)   Hunger Vital Sign    Worried About Running Out of Food in the Last Year: Sometimes true    Ran Out of Food in the Last Year: Sometimes true  Transportation Needs: No Transportation Needs (03/30/2023)   PRAPARE - Administrator, Civil Service (Medical): No    Lack of Transportation (Non-Medical): No  Physical Activity: Not on file  Stress: Stress Concern Present (12/14/2022)   [employer] of Occupational Health - Occupational Stress Questionnaire    Feeling of Stress : To some extent  Social Connections: Socially Isolated (12/14/2022)   Social Connection and Isolation Panel [NHANES]    Frequency of Communication with Friends and Family: More than three times a week    Frequency of Social Gatherings with Friends and Family: More than three times a week    Attends Religious Services: Never    Database administrator or Organizations: No    Attends Banker Meetings: Never    Marital Status: Never married  Intimate Partner Violence: Not At Risk (12/14/2022)   Humiliation, Afraid, Rape, and Kick questionnaire    Fear of Current or Ex-Partner: No    Emotionally Abused: No    Physically Abused: No    Sexually Abused: No    Physical Exam: There were no vitals filed for this visit. There is no height or weight on file to calculate BMI. GEN: NAD EYE: Sclerae anicteric ENT: MMM CV: Non-tachycardic GI: Soft, 15/10 pain throughout the abdomen encircling the abdomen  NEURO:  Alert & Oriented x 3  Lab Results: No results for input(s): "WBC", "HGB", "HCT", "PLT" in the last 72 hours. BMET No results for input(s): "NA", "K", "CL", "CO2", "GLUCOSE", "BUN", "CREATININE", "CALCIUM" in the last 72 hours. LFT No results for input(s): "PROT", "ALBUMIN", "AST", "ALT", "ALKPHOS", "BILITOT",  "BILIDIR", "IBILI" in the last 72 hours. PT/INR No results for input(s): "LABPROT", "INR" in the last 72 hours.   Impression / Plan: This is a 56 y.o.female who presents for EGD/Enteroscopy for evaluation of nausea and vomiting in setting of pancreatic cancer.  Increased risk EGD/Enteroscopy for potential enteral stenting, if patient truly has significant stenosis from her known cancer.  Could also be gastroparesis related, so we will see.  The risks and benefits of endoscopic evaluation/treatment were discussed with the patient and/or family; these include but are not limited to the risk of perforation, infection, bleeding, missed lesions, lack of diagnosis, severe illness requiring hospitalization, as well as anesthesia and sedation related illnesses.  The patient's history has been reviewed, patient examined, no change in status, and deemed stable for procedure.  The patient and/or family is agreeable to proceed.    Corliss Parish, MD [place]  Gastroenterology Advanced Endoscopy Office # [phone]

## 2023-08-04 NOTE — Progress Notes (Signed)
Zofran 4 mg given for N/V per Dr. Meridee Score orders. Spoke with patient regarding her pain and her wishes, pt states she would like to go home and take her home medications vs. Being admitted to hospital for pain control. Relayed messaged to Dr. Meridee Score.

## 2023-08-04 NOTE — Anesthesia Preprocedure Evaluation (Addendum)
Anesthesia Evaluation  Patient identified by MRN, date of birth, ID band Patient awake    Reviewed: Allergy & Precautions, NPO status , Patient's Chart, lab work & pertinent test results  History of Anesthesia Complications Negative for: history of anesthetic complications  Airway Mallampati: I  TM Distance: >3 FB Neck ROM: Full    Dental  (+) Edentulous Upper, Poor Dentition, Missing, Chipped, Dental Advisory Given   Pulmonary shortness of breath, COPD,  COPD inhaler, Patient abstained from smoking., former smoker   breath sounds clear to auscultation       Cardiovascular hypertension, Pt. on medications (-) angina  Rhythm:Regular Rate:Normal  '22 ECHO: EF 55 to 60%. 1. The LV has normal function, no regional wall motion abnormalities. Left ventricular diastolic parameters were normal.   2. RVF is normal. The right ventricular size is normal.   3. The mitral valve is normal in structure. No evidence of MR.   4. The aortic valve is normal in structure. AI is not visualized.     Neuro/Psych  Headaches  Anxiety        GI/Hepatic Neg liver ROS,GERD (emesis most nights)  Medicated and Poorly Controlled,,Pancreatic cancer   Endo/Other  Hypothyroidism  BMI 30.6  Renal/GU Renal InsufficiencyRenal disease     Musculoskeletal  (+) Arthritis ,    Abdominal   Peds  Hematology   Anesthesia Other Findings H/o cervical cancer Chronic pain: morphine  Reproductive/Obstetrics                             Anesthesia Physical Anesthesia Plan  ASA: 3  Anesthesia Plan: General   Post-op Pain Management: Minimal or no pain anticipated   Induction: Intravenous and Rapid sequence  PONV Risk Score and Plan: 2 and Ondansetron and Treatment may vary due to age or medical condition  Airway Management Planned: Oral ETT  Additional Equipment: None  Intra-op Plan:   Post-operative Plan: Extubation in  OR  Informed Consent: I have reviewed the patients History and Physical, chart, labs and discussed the procedure including the risks, benefits and alternatives for the proposed anesthesia with the patient or authorized representative who has indicated his/her understanding and acceptance.     Dental advisory given  Plan Discussed with: CRNA and Surgeon  Anesthesia Plan Comments:         Anesthesia Quick Evaluation

## 2023-08-04 NOTE — Discharge Instructions (Addendum)
YOU HAD AN ENDOSCOPIC PROCEDURE TODAY: Refer to the procedure report and other information in the discharge instructions given to you for any specific questions about what was found during the examination. If this information does not answer your questions, please call [place] office at [phone] to clarify.   YOU SHOULD EXPECT: Some feelings of bloating in the abdomen. Passage of more gas than usual. Walking can help get rid of the air that was put into your GI tract during the procedure and reduce the bloating. If you had a lower endoscopy (such as a colonoscopy or flexible sigmoidoscopy) you may notice spotting of blood in your stool or on the toilet paper. Some abdominal soreness may be present for a day or two, also.  DIET: First 2 days LIQUIDS Only  ACTIVITY: Your care partner should take you home directly after the procedure. You should plan to take it easy, moving slowly for the rest of the day. You can resume normal activity the day after the procedure however YOU SHOULD NOT DRIVE, use power tools, machinery or perform tasks that involve climbing or major physical exertion for 24 hours (because of the sedation medicines used during the test).   SYMPTOMS TO REPORT IMMEDIATELY: A gastroenterologist can be reached at any hour. Please call [phone]  for any of the following symptoms:  Following lower endoscopy (colonoscopy, flexible sigmoidoscopy) Excessive amounts of blood in the stool  Significant tenderness, worsening of abdominal pains  Swelling of the abdomen that is new, acute  Fever of 100 or higher  Following upper endoscopy (EGD, EUS, ERCP, esophageal dilation) Vomiting of blood or coffee ground material  New, significant abdominal pain  New, significant chest pain or pain under the shoulder blades  Painful or persistently difficult swallowing  New shortness of breath  Black, tarry-looking or red, bloody stools  FOLLOW UP:  If any biopsies were taken you will be  contacted by phone or by letter within the next 1-3 weeks. Call [phone]  if you have not heard about the biopsies in 3 weeks.  Please also call with any specific questions about appointments or follow up tests.

## 2023-08-04 NOTE — Anesthesia Procedure Notes (Signed)
Procedure Name: Intubation Date/Time: 08/04/2023 3:05 PM  Performed by: Johnette Abraham, CRNAPre-anesthesia Checklist: Patient identified, Emergency Drugs available, Suction available and Patient being monitored Patient Re-evaluated:Patient Re-evaluated prior to induction Oxygen Delivery Method: Circle System Utilized Preoxygenation: Pre-oxygenation with 100% oxygen Induction Type: IV induction Ventilation: Mask ventilation without difficulty Laryngoscope Size: Mac and 3 Grade View: Grade II Tube type: Oral Tube size: 7.0 mm Number of attempts: 1 Airway Equipment and Method: Stylet and Oral airway Placement Confirmation: ETT inserted through vocal cords under direct vision, positive ETCO2 and breath sounds checked- equal and bilateral Secured at: 22 cm Tube secured with: Tape Dental Injury: Teeth and Oropharynx as per pre-operative assessment

## 2023-08-07 ENCOUNTER — Encounter (HOSPITAL_COMMUNITY): Payer: Self-pay | Admitting: Gastroenterology

## 2023-08-08 ENCOUNTER — Encounter: Payer: Self-pay | Admitting: Gastroenterology

## 2023-08-08 LAB — SURGICAL PATHOLOGY

## 2023-08-09 ENCOUNTER — Other Ambulatory Visit: Payer: Self-pay | Admitting: *Deleted

## 2023-08-09 ENCOUNTER — Other Ambulatory Visit: Payer: Self-pay | Admitting: Nurse Practitioner

## 2023-08-09 ENCOUNTER — Other Ambulatory Visit (HOSPITAL_BASED_OUTPATIENT_CLINIC_OR_DEPARTMENT_OTHER): Payer: Self-pay

## 2023-08-09 ENCOUNTER — Other Ambulatory Visit: Payer: Self-pay | Admitting: Oncology

## 2023-08-09 ENCOUNTER — Other Ambulatory Visit: Payer: Self-pay

## 2023-08-09 ENCOUNTER — Encounter: Payer: Self-pay | Admitting: Oncology

## 2023-08-09 ENCOUNTER — Inpatient Hospital Stay: Payer: Medicaid Other

## 2023-08-09 ENCOUNTER — Inpatient Hospital Stay (HOSPITAL_BASED_OUTPATIENT_CLINIC_OR_DEPARTMENT_OTHER): Payer: Medicaid Other | Admitting: Oncology

## 2023-08-09 DIAGNOSIS — C259 Malignant neoplasm of pancreas, unspecified: Secondary | ICD-10-CM | POA: Diagnosis not present

## 2023-08-09 DIAGNOSIS — C250 Malignant neoplasm of head of pancreas: Secondary | ICD-10-CM

## 2023-08-09 DIAGNOSIS — J4540 Moderate persistent asthma, uncomplicated: Secondary | ICD-10-CM

## 2023-08-09 DIAGNOSIS — Z95828 Presence of other vascular implants and grafts: Secondary | ICD-10-CM

## 2023-08-09 LAB — CBC WITH DIFFERENTIAL (CANCER CENTER ONLY)
Abs Immature Granulocytes: 0.04 10*3/uL (ref 0.00–0.07)
Basophils Absolute: 0 10*3/uL (ref 0.0–0.1)
Basophils Relative: 0 %
Eosinophils Absolute: 0.1 10*3/uL (ref 0.0–0.5)
Eosinophils Relative: 1 %
HCT: 30.7 % — ABNORMAL LOW (ref 36.0–46.0)
Hemoglobin: 9.9 g/dL — ABNORMAL LOW (ref 12.0–15.0)
Immature Granulocytes: 1 %
Lymphocytes Relative: 12 %
Lymphs Abs: 0.9 10*3/uL (ref 0.7–4.0)
MCH: 27.7 pg (ref 26.0–34.0)
MCHC: 32.2 g/dL (ref 30.0–36.0)
MCV: 85.8 fL (ref 80.0–100.0)
Monocytes Absolute: 0.4 10*3/uL (ref 0.1–1.0)
Monocytes Relative: 6 %
Neutro Abs: 6.1 10*3/uL (ref 1.7–7.7)
Neutrophils Relative %: 80 %
Platelet Count: 514 10*3/uL — ABNORMAL HIGH (ref 150–400)
RBC: 3.58 MIL/uL — ABNORMAL LOW (ref 3.87–5.11)
RDW: 17 % — ABNORMAL HIGH (ref 11.5–15.5)
WBC Count: 7.6 10*3/uL (ref 4.0–10.5)
nRBC: 0 % (ref 0.0–0.2)

## 2023-08-09 LAB — CMP (CANCER CENTER ONLY)
ALT: 6 U/L (ref 0–44)
AST: 13 U/L — ABNORMAL LOW (ref 15–41)
Albumin: 3.3 g/dL — ABNORMAL LOW (ref 3.5–5.0)
Alkaline Phosphatase: 165 U/L — ABNORMAL HIGH (ref 38–126)
Anion gap: 12 (ref 5–15)
BUN: 14 mg/dL (ref 6–20)
CO2: 31 mmol/L (ref 22–32)
Calcium: 9.7 mg/dL (ref 8.9–10.3)
Chloride: 97 mmol/L — ABNORMAL LOW (ref 98–111)
Creatinine: 0.82 mg/dL (ref 0.44–1.00)
GFR, Estimated: >60 mL/min (ref >60)
Glucose, Bld: 78 mg/dL (ref 70–99)
Potassium: 3.7 mmol/L (ref 3.5–5.1)
Sodium: 140 mmol/L (ref 135–145)
Total Bilirubin: 0.9 mg/dL (ref 0.3–1.2)
Total Protein: 7.3 g/dL (ref 6.5–8.1)

## 2023-08-09 MED ORDER — BISACODYL 5 MG PO TBEC
10.0000 mg | DELAYED_RELEASE_TABLET | Freq: Every day | ORAL | 2 refills | Status: AC | PRN
Start: 2023-08-09 — End: ?

## 2023-08-09 MED ORDER — FLUCONAZOLE 100 MG PO TABS: 100.0000 mg | ORAL_TABLET | Freq: Every day | ORAL | 0 refills | Status: DC

## 2023-08-09 MED ORDER — ALBUTEROL SULFATE HFA 108 (90 BASE) MCG/ACT IN AERS
INHALATION_SPRAY | RESPIRATORY_TRACT | 1 refills | Status: DC
Start: 2023-08-09 — End: 2023-08-12

## 2023-08-09 MED ORDER — PREDNISONE 10 MG PO TABS
10.0000 mg | ORAL_TABLET | Freq: Every day | ORAL | 0 refills | Status: DC
Start: 2023-08-09 — End: 2023-10-06

## 2023-08-09 MED ORDER — PROCHLORPERAZINE MALEATE 10 MG PO TABS: ORAL_TABLET | ORAL | 0 refills | Status: DC

## 2023-08-09 MED ORDER — PANTOPRAZOLE SODIUM 40 MG PO TBEC
40.0000 mg | DELAYED_RELEASE_TABLET | Freq: Two times a day (BID) | ORAL | 0 refills | Status: DC
Start: 2023-08-09 — End: 2023-08-09

## 2023-08-09 MED ORDER — ONDANSETRON 8 MG PO TBDP
8.0000 mg | ORAL_TABLET | Freq: Three times a day (TID) | ORAL | 0 refills | Status: DC | PRN
Start: 2023-08-09 — End: 2023-10-06

## 2023-08-09 MED ORDER — OXYCODONE HCL 10 MG PO TABS
10.0000 mg | ORAL_TABLET | ORAL | 0 refills | Status: DC | PRN
Start: 2023-08-09 — End: 2023-08-17
  Filled 2023-08-09: qty 60, 5d supply, fill #0

## 2023-08-09 MED ORDER — DULOXETINE HCL 30 MG PO CPEP: 60.0000 mg | ORAL_CAPSULE | Freq: Every day | ORAL | 1 refills | Status: DC

## 2023-08-09 MED ORDER — LIDOCAINE-PRILOCAINE 2.5-2.5 % EX CREA
TOPICAL_CREAM | CUTANEOUS | 2 refills | Status: AC
Start: 2023-08-09 — End: ?

## 2023-08-09 MED ORDER — MORPHINE SULFATE (CONCENTRATE) 10 MG/0.5ML PO SOLN
20.0000 mg | Freq: Three times a day (TID) | ORAL | 0 refills | Status: DC | PRN
Start: 2023-08-09 — End: 2023-08-30
  Filled 2023-08-09: qty 30, 7d supply, fill #0

## 2023-08-09 MED ORDER — MORPHINE SULFATE ER 60 MG PO TBCR
60.0000 mg | EXTENDED_RELEASE_TABLET | Freq: Two times a day (BID) | ORAL | 0 refills | Status: DC
Start: 2023-08-09 — End: 2023-09-05
  Filled 2023-08-09: qty 60, 30d supply, fill #0

## 2023-08-09 MED ORDER — GABAPENTIN 300 MG PO CAPS
300.0000 mg | ORAL_CAPSULE | Freq: Every day | ORAL | 0 refills | Status: DC
Start: 2023-08-09 — End: 2023-10-06

## 2023-08-09 MED ORDER — ONDANSETRON HCL 8 MG PO TABS: 8.0000 mg | ORAL_TABLET | Freq: Three times a day (TID) | ORAL | 0 refills | Status: DC | PRN

## 2023-08-09 MED ORDER — SCOPOLAMINE 1 MG/3DAYS TD PT72: 1.0000 | MEDICATED_PATCH | TRANSDERMAL | 0 refills | Status: DC

## 2023-08-09 NOTE — Progress Notes (Signed)
MD requested to add CA 19.9 to lab--not able to add on per [facility] lab. Will collect at next visit. Scheduler notified.

## 2023-08-09 NOTE — Progress Notes (Signed)
[facility] OFFICE PROGRESS NOTE   Diagnosis: Pancreas cancer  INTERVAL HISTORY:   Wendy Hoffman returns as scheduled.  She underwent a small bowel endoscopy on 08/04/2023.  There was retained fluid in the stomach.  Diffuse severe mucosal changes with congestion and granularity were noted in the second and third portion of the duodenum.  There was narrowing in the middle of the second portion of the duodenum at the junction of the second and third portion.  Biopsies were obtained.  A stent was placed.  She reports worsening of nausea, dysphagia, and vomiting following procedure.  She is able to tolerate some liquids.  She has difficulty taking her medications.  She reports adequate pain control with current narcotic regimen.  Objective:  Vital signs in last 24 hours:  Blood pressure 139/88, pulse 96, temperature 97.9 F (36.6 C), temperature source Oral, resp. rate 20, height 5\' 4"  (1.626 m), weight 176 lb 4.8 oz (80 kg), SpO2 100%.    HEENT: Thrush over the tongue Resp: Lungs clear bilaterally Cardio: Regular rate and rhythm GI: Mild diffuse tenderness in the upper abdomen, no mass, no hepatosplenomegaly, no apparent ascites Vascular: No leg edema  Portacath/PICC-without erythema  Lab Results:  Lab Results  Component Value Date   WBC 7.6 08/09/2023   HGB 9.9 (L) 08/09/2023   HCT 30.7 (L) 08/09/2023   MCV 85.8 08/09/2023   PLT 514 (H) 08/09/2023   NEUTROABS 6.1 08/09/2023    CMP  Lab Results  Component Value Date   NA 139 07/06/2023   K 3.6 07/06/2023   CL 97 (L) 07/06/2023   CO2 33 (H) 07/06/2023   GLUCOSE 103 (H) 07/06/2023   BUN 12 07/06/2023   CREATININE 1.21 (H) 07/06/2023   CALCIUM 9.5 07/06/2023   PROT 6.8 07/06/2023   ALBUMIN 3.7 07/06/2023   AST 13 (L) 07/06/2023   ALT 6 07/06/2023   ALKPHOS 131 (H) 07/06/2023   BILITOT 0.4 07/06/2023   GFRNONAA 53 (L) 07/06/2023   GFRAA >60 07/21/2020    Lab Results  Component Value Date   CEA1  68.8 (H) 02/23/2021   CEA <0.5 10/15/2008   WUX324 9,241 (H) 06/13/2023    Medications: I have reviewed the patient's current medications.   Assessment/Plan: Pancreas cancer -CT abdomen/pelvis without contrast 02/16/2021-multiple large hypoechoic masses in the patient's liver consistent metastatic disease, ill-defined masslike area in the pancreatic head measuring approximate 4.7 cm, possible filling defect in the right ventricle. -EGD 02/19/2021-large infiltrative mass with bleeding in the second portion of the duodenum, appears to be arising near the ampulla, partially obstructive with friable mucosa.  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 -Flexible sigmoidoscopy 02/19/2021-diverticulosis in the sigmoid colon, nonbleeding external and internal hemorrhoids -Cardiac CT 02/20/2021-mass in the mid RV attached to the interventricular septum measuring 16 mm x 6 mm and concerning for metastatic tumor -MRI abdomen with/without contrast 02/21/2021-mass in the posterior pancreatic head measuring 4.3 x 3.8 cm with obstruction of the central pancreatic duct and diffuse dilatation up to 6 mm consistent with pancreatic adenocarcinoma, liver lesions the largest mass of the central left lobe measuring 9.9 x 9.2 cm consistent with hepatic metastatic disease,  hepatomegaly. -Ultrasound-guided biopsy of a left liver lesion 02/24/2021-adenocarcinoma, morphologically similar to the duodenal biopsy; microsatellite stable, tumor mutation burden 1 -Negative genetic testing -Palliative radiation to the duodenal mass 02/25/2021-03/06/2021 -Cycle 1 FOLFOX 03/17/2021 -Cycle 2 FOLFOX 04/01/2021 -Cycle 3 FOLFIRINOX 04/14/2021 -Cycle 4 FOLFIRINOX 04/27/2021, Udenyca added -Cycle 5 FOLFIRINOX 05/12/2021, Udenyca -MRI  abdomen 05/23/2021-decrease in size of pancreas mass and hepatic lesions, no progressive disease -Cycle 6 FOLFIRINOX 05/27/2021 -CT 06/17/2021-multiple liver metastases similar to her prior  exam.  Pancreas mass not significantly changed. -CT 06/26/2021-stable pancreas mass, no significant change in size and number of known liver metastases. -CT abdomen/pelvis 07/12/2021-new gastrostomy tube.  Multiple liver masses slightly decreased in size from prior exam.  Pancreas head mass unchanged. -Cycle 1 gemcitabine/Abraxane 07/30/2021 -Cycle 2 gemcitabine/Abraxane 08/13/2021 -Cycle 3 gemcitabine 08/27/2021, Abraxane held due to significant bilateral toe pain, question neuropathy -Cycle 4 gemcitabine, Abraxane held due to neuropathy, 09/10/2021 -Cycle 5 gemcitabine, Abraxane held due to neuropathy, 09/24/2021 -Cycle 6 gemcitabine, Abraxane held due to neuropathy 10/16/2021 -CTs 10/29/2021-mild progression of multifocal liver metastases; mild increase in size of hypoenhancing mass within the head of the pancreas; new mild pleural nodularity along the posterior lower lobes -Cycle 7 gemcitabine/Abraxane 11/03/2021 -Cycle 8 gemcitabine/Abraxane 11/23/2021 -Cycle 9 gemcitabine/Abraxane 12/10/2021 -Cycle 10 Gemcitabine/Abraxane 12/24/2021 -CT abdomen/pelvis 01/04/2022-decrease size of pancreas head mass, no change in multifocal hepatic metastases -Cycle 11 gemcitabine/Abraxane 01/07/2022 -Cycle 12 gemcitabine/Abraxane 02/04/2022 -Cycle 13 Gemcitabine/Abraxane 02/18/2022 -Cycle 14 gemcitabine/Abraxane 03/11/2022 -Cycle 15 gemcitabine/Abraxane 03/29/2022 -Cycle 16 gemcitabine/Abraxane 04/16/2022 -CT abdomen/pelvis 04/22/2022-mixed response in the liver, some lesions larger, some smaller, no change in pancreas head mass, no evidence of metastatic disease elsewhere in the abdomen or pelvis -Cycle 17 gemcitabine/Abraxane 05/17/2022 -Cycle 18 Gemcitabine/Abraxane 06/01/2022 -Cycle 19 gemcitabine/Abraxane 06/18/2022 -Cycle 20 gemcitabine/Abraxane 07/05/2022 -Cycle 21 gemcitabine/Abraxane 08/09/2022 -Cycle 22 gemcitabine/Abraxane 08/23/2022 -CT abdomen/pelvis 09/01/2022-stable liver metastases, difficult to visualize the  pancreas head mass, no evidence of disease progression -Cycle 23 gemcitabine/Abraxane 09/06/2022 -Cycle 24 gemcitabine/Abraxane 09/20/2022 -Cycle 25 gemcitabine/Abraxane 10/18/2022 -Cycle 26 gemcitabine/Abraxane 11/01/2022 -Cycle 27 gemcitabine/Abraxane 11/29/2022 -CT abdomen/pelvis 12/14/2022-increased size of liver metastases, less well-defined pancreas mass, 4 mm right lung nodule -Cycle 1 FOLFIRINOX 01/10/2023 -Cycle 2 FOLFIRINOX 01/24/2023, 5-FU dose reduced secondary to mucositis, chemotherapy plan dose reduce for weight loss -Develop pruritus during the oxaliplatin infusion, initial improvement with Benadryl and Solu-Medrol, oxaliplatin was resumed and the pruritus recurred.  Oxaliplatin was discontinued. -Chemotherapy held 02/07/2023 due to diarrhea, nausea/vomiting, dehydration -Cycle 3 FOLFIRINOX 03/07/2023 -Cycle 4 FOLFIRINOX 03/22/2023, 5-FU dose reduction due to mucositis, chemotherapy plan dose reduction for weight loss -CTs 03/30/2021-numerous bulky liver lesions, majority are slightly diminished in size though some lesions are unchanged or slightly enlarged. -Cycle 5 FOLFIRINOX 04/05/2023 -04/05/2023 reaction to oxaliplatin with dyspnea and pruritus -FOLFIRI every 3 weeks beginning 04/27/2023 -FOLFIRI 05/25/2023 -CTs 07/04/2023-widespread hepatic metastatic disease again noted, some of the lesions superiorly in the right lobe may be mildly progressive, direct comparison limited by lack of contrast.  Grossly unchanged pancreatic head mass.  Constipation. 2.  Iron deficiency anemia-improved -Feraheme 510 mg 02/20/2021 3.  Protein calorie malnutrition 4.  Possible right ventricular filling defect on noncontrast CT scan MRI cardiac morphology 02/21/2021-mass in the mid RV attached to the interventricular septum measures 16 mm x 6 mm.  Mass appears heterogeneous with area of enhancement on LGE imaging.  Mass is concerning for metastatic tumor.  Normal LV size and systolic function.  Normal RV size and  systolic function. 5.  Hypertension 6.  History of CVA 7.  Hyperlipidemia 8.  Hypothyroidism 9.  Morbid obesity 10.  Asthma 11.  Migraines 12.  Pain secondary to #1 13.  Port-A-Cath placement 02/25/2021 14.  Hypokalemia 04/01/2021-started a potassium supplement 15.  Hospital admission 06/17/2021-intractable nausea and vomiting EGD 06/19/2021-diffuse gastritis, nonobstructing duodenal mass, biopsy of stomach-no malignancy or H. pylori, duodenal mass-adenocarcinoma Small bowel endoscopy  08/04/2023-narrowing at the second and third portion of the duodenum, stent placed, biopsy from the duodenum negative for malignancy 16.  Hospital admission 07/12/2021 with nausea/vomiting, abdominal pain, and hypokalemia 17.  Percutaneous gastrostomy tube placement 07/01/2021 18.  07/21/2022 bilateral lower extremity venous Doppler study-negative for DVT.    Disposition: Wendy Hoffman appears unchanged.  She has metastatic pancreas cancer.  A CT 1 month ago revealed possible progression in the liver.  Her clinical status has declined over the past several months secondary to intractable nausea and vomiting.  She was found to have evidence of extrinsic duodenal compression on an endoscopy 08/04/2023.  A biopsy from the duodenum was negative.  A stent was placed.  Her symptoms have worsened following stent placement.  She asked me to contact Dr. Meridee Score to see whether the stent could be removed.  I understand it is unlikely the stent can be removed.  We discussed treatment options for the cancer.  Options are limited.  I suspect the cancer is progressing based on her symptoms.  I do not recommend chemotherapy unless she is able to tolerate a diet.  We will check a CA 19-9 today.  She will return for an office visit next week.  I will recommend hospice care if there is no option for improving the nausea/vomiting.  She does not appear to be a candidate for a gastrojejunostomy.  She will complete a course of Diflucan for  oral candidiasis.  She was also noted to have abundant Candida on the 08/04/2023 esophagus biopsy.  Thornton Papas, MD  08/09/2023  11:52 AM

## 2023-08-09 NOTE — Telephone Encounter (Signed)
Patient reports she needs refills on "all my medications". Wants non-narcotics to go to [facility]. Inquired about the scripts still valid with mail order pharmacy, [facility] and she reports they will not be sending any medications until 08/14/23 from this pharmacy. Refilled her medications except the following per Dr. Truett Perna: Clindamycin-d/c Dexamethasone-not taking at this time Furosemide-ordered by PCP and not using at this time Ibuprofen 800 mg--allergic alert Magic mouthwash-patient reports not using it Potassium chloride-not using since not taking K+ Per [facility] pharmacy: insurance only covers #15 Ambien per 30 days, notified managed care to see if quantity limit override can be done.

## 2023-08-10 ENCOUNTER — Telehealth: Payer: Self-pay | Admitting: *Deleted

## 2023-08-10 ENCOUNTER — Other Ambulatory Visit (HOSPITAL_BASED_OUTPATIENT_CLINIC_OR_DEPARTMENT_OTHER): Payer: Self-pay

## 2023-08-10 LAB — CYTOLOGY - NON PAP

## 2023-08-10 NOTE — Telephone Encounter (Signed)
Dr. Truett Perna discussed case with Dr. Meridee Score and Dr. Meridee Score states the duodenal stent can not be removed. Needs abdomen 2 view to check for stent position this week and send him results. Notified Ms. Adrien and she will go to radiology tomorrow. Wants Dr. Truett Perna aware that she is able to consume fluids, but soon afterwards, about 1/2 of what she drinks comes back. MD notified.

## 2023-08-11 ENCOUNTER — Ambulatory Visit (HOSPITAL_BASED_OUTPATIENT_CLINIC_OR_DEPARTMENT_OTHER)
Admission: RE | Admit: 2023-08-11 | Discharge: 2023-08-11 | Disposition: A | Discharge status: Home / Self Care | Payer: Medicaid Other | Source: Ambulatory Visit | Attending: Oncology | Admitting: Oncology

## 2023-08-11 ENCOUNTER — Other Ambulatory Visit: Payer: Self-pay | Admitting: Oncology

## 2023-08-11 DIAGNOSIS — C250 Malignant neoplasm of head of pancreas: Secondary | ICD-10-CM | POA: Diagnosis present

## 2023-08-11 DIAGNOSIS — J4540 Moderate persistent asthma, uncomplicated: Secondary | ICD-10-CM

## 2023-08-12 ENCOUNTER — Other Ambulatory Visit: Payer: Self-pay

## 2023-08-12 ENCOUNTER — Telehealth: Payer: Self-pay

## 2023-08-12 ENCOUNTER — Telehealth: Payer: Self-pay | Admitting: *Deleted

## 2023-08-12 DIAGNOSIS — J4540 Moderate persistent asthma, uncomplicated: Secondary | ICD-10-CM

## 2023-08-12 MED ORDER — ALBUTEROL SULFATE HFA 108 (90 BASE) MCG/ACT IN AERS
INHALATION_SPRAY | RESPIRATORY_TRACT | 0 refills | Status: DC
Start: 2023-08-12 — End: 2023-10-06

## 2023-08-12 MED ORDER — FLUCONAZOLE 100 MG PO TABS: 100.0000 mg | ORAL_TABLET | Freq: Every day | ORAL | 6 refills | Status: DC

## 2023-08-12 NOTE — Telephone Encounter (Signed)
-----   Message from [facility] sent at 08/12/2023  4:30 AM EDT ----- Alexia Freestone, Please let the patient know when you call her about her esophageal Candida findings, that I have reviewed the x-ray that Dr. Elnita Maxwell ordered. The x-ray shows that the stent is in good position and does not look like it has migrated at all. She has had significant issues with nausea and vomiting, if she still having significant symptoms, then she needs to undergo a CT abdomen/pelvis to evaluate for gastric outlet obstruction, and if she can do this with oral contrast that would be the most ideal next step, if she cannot tolerate the oral contrast after doing it, then at least performing the CT scan with IV contrast will be helpful. I am not sure that another stent going in will be helpful unless she has developed signs of overt gastric outlet obstruction, where in which I may try to telescope another stent within the stent. Again also it cannot be removed. Thanks. GM ----- Message ----- From: Wandalee Ferdinand, RN Sent: 08/11/2023   2:04 PM EDT To: Lemar Lofty., MD  Forwarded per Dr. Kalman Drape request.

## 2023-08-12 NOTE — Telephone Encounter (Signed)
See message sent to Dr Meridee Score regarding medication sent in by Dr Truett Perna.     Mansouraty, Netty Starring., MD  Loretha Stapler, RN Cc: Ladene Artist, MD Cynthie Garmon, The patient has evidence of Candida of the esophagus. She has had lots of issues with nausea and vomiting. She does need treatment with fluconazole however to try to treat this as it may help some of her symptoms. Fluconazole 200 mg on day 1 and fluconazole 100 mg on days 2-14. Thanks. GM   Pt Xray reviewed if she persists with nausea and vomiting she should undergo a CT abdomen/pelvis to evaluate for gastric outlet obstruction, and if she can do this with oral contrast that would be the most ideal next step, if she cannot tolerate the oral contrast after doing it, then at least performing the CT scan with IV contrast will be helpful.   Left message on machine to call back

## 2023-08-12 NOTE — Telephone Encounter (Signed)
Called Wendy Hoffman to inform her that the stent is in good position per Dr. Meridee Score. He suggests for continued N/V to have CT abd/pelvis to evaluate for gastric outlet obstruction. Oral contrast would be best, but if not at least IV contrast.  Since her endoscopy showed esophageal candida Dr. Truett Perna wants her to take a total of 10 days of fluconazole and new script was sent to her [facility]. She will f/u here on 9/4 to discuss next steps.

## 2023-08-16 ENCOUNTER — Telehealth: Payer: Medicaid Other | Admitting: Family

## 2023-08-16 ENCOUNTER — Other Ambulatory Visit: Payer: Self-pay

## 2023-08-16 MED ORDER — FLUCONAZOLE 100 MG PO TABS: 100.0000 mg | ORAL_TABLET | Freq: Every day | ORAL | 0 refills | Status: AC

## 2023-08-16 NOTE — Telephone Encounter (Signed)
Gemma with [employer] called on behalf of member requesting a referral for a home health aide from [facility]. Their phone number is [phone]

## 2023-08-16 NOTE — Telephone Encounter (Signed)
I have spoken to the pt and she is aware that she needs additional diflucan to last 14 days.  I have sent that prescription as requested.

## 2023-08-17 ENCOUNTER — Inpatient Hospital Stay: Payer: 59

## 2023-08-17 ENCOUNTER — Inpatient Hospital Stay: Payer: 59 | Attending: Nurse Practitioner | Admitting: Oncology

## 2023-08-17 ENCOUNTER — Encounter: Payer: Self-pay | Admitting: Oncology

## 2023-08-17 ENCOUNTER — Other Ambulatory Visit (HOSPITAL_BASED_OUTPATIENT_CLINIC_OR_DEPARTMENT_OTHER): Payer: Self-pay

## 2023-08-17 VITALS — BP 121/78 | HR 93 | Temp 98.1°F | Resp 18 | Ht 64.0 in | Wt 177.0 lb

## 2023-08-17 DIAGNOSIS — M549 Dorsalgia, unspecified: Secondary | ICD-10-CM | POA: Diagnosis not present

## 2023-08-17 DIAGNOSIS — C250 Malignant neoplasm of head of pancreas: Secondary | ICD-10-CM

## 2023-08-17 DIAGNOSIS — I10 Essential (primary) hypertension: Secondary | ICD-10-CM | POA: Diagnosis not present

## 2023-08-17 DIAGNOSIS — C787 Secondary malignant neoplasm of liver and intrahepatic bile duct: Secondary | ICD-10-CM | POA: Diagnosis not present

## 2023-08-17 DIAGNOSIS — B370 Candidal stomatitis: Secondary | ICD-10-CM | POA: Diagnosis not present

## 2023-08-17 DIAGNOSIS — Z95828 Presence of other vascular implants and grafts: Secondary | ICD-10-CM

## 2023-08-17 DIAGNOSIS — J45909 Unspecified asthma, uncomplicated: Secondary | ICD-10-CM | POA: Diagnosis not present

## 2023-08-17 DIAGNOSIS — R918 Other nonspecific abnormal finding of lung field: Secondary | ICD-10-CM | POA: Diagnosis not present

## 2023-08-17 DIAGNOSIS — E039 Hypothyroidism, unspecified: Secondary | ICD-10-CM | POA: Insufficient documentation

## 2023-08-17 DIAGNOSIS — E876 Hypokalemia: Secondary | ICD-10-CM | POA: Insufficient documentation

## 2023-08-17 DIAGNOSIS — D509 Iron deficiency anemia, unspecified: Secondary | ICD-10-CM | POA: Diagnosis not present

## 2023-08-17 DIAGNOSIS — Z8673 Personal history of transient ischemic attack (TIA), and cerebral infarction without residual deficits: Secondary | ICD-10-CM | POA: Diagnosis not present

## 2023-08-17 DIAGNOSIS — E785 Hyperlipidemia, unspecified: Secondary | ICD-10-CM | POA: Diagnosis not present

## 2023-08-17 DIAGNOSIS — R112 Nausea with vomiting, unspecified: Secondary | ICD-10-CM | POA: Diagnosis not present

## 2023-08-17 DIAGNOSIS — R109 Unspecified abdominal pain: Secondary | ICD-10-CM | POA: Diagnosis not present

## 2023-08-17 DIAGNOSIS — E46 Unspecified protein-calorie malnutrition: Secondary | ICD-10-CM | POA: Diagnosis not present

## 2023-08-17 DIAGNOSIS — C259 Malignant neoplasm of pancreas, unspecified: Secondary | ICD-10-CM | POA: Diagnosis not present

## 2023-08-17 DIAGNOSIS — G43909 Migraine, unspecified, not intractable, without status migrainosus: Secondary | ICD-10-CM | POA: Insufficient documentation

## 2023-08-17 MED ORDER — SODIUM CHLORIDE 0.9% FLUSH
10.0000 mL | Freq: Once | INTRAVENOUS | Status: AC
  Administered 2023-08-17: 10 mL

## 2023-08-17 MED ORDER — OXYCODONE HCL 10 MG PO TABS
10.0000 mg | ORAL_TABLET | ORAL | 0 refills | Status: DC | PRN
  Filled 2023-08-17: qty 60, 5d supply, fill #0

## 2023-08-17 MED ORDER — HEPARIN SOD (PORK) LOCK FLUSH 100 UNIT/ML IV SOLN
500.0000 [IU] | Freq: Once | INTRAVENOUS | Status: AC
  Administered 2023-08-17: 500 [IU]

## 2023-08-17 MED ORDER — HEPARIN SOD (PORK) LOCK FLUSH 100 UNIT/ML IV SOLN: 250.0000 [IU] | Freq: Once | INTRAVENOUS | Status: DC

## 2023-08-17 NOTE — Telephone Encounter (Signed)
Called Gemma backed, asked her to give the office a call back to confirm what services the pt is needing.

## 2023-08-17 NOTE — Progress Notes (Signed)
[facility] OFFICE PROGRESS NOTE   Diagnosis: Pancreas cancer  INTERVAL HISTORY:   Ms. Reyes began treatment for oropharyngeal candidiasis after the office visit on 08/09/2023.  She reports improvement in nausea/vomiting, and dysphagia after beginning Diflucan.  She continues to have intermittent vomiting of liquids and solids, but this is significantly improved.  She continues to have abdominal pain.  She reports constipation that is not relieved with daily Dulcolax and MiraLAX.  Objective:  Vital signs in last 24 hours:  Blood pressure 121/78, pulse 93, temperature 98.1 F (36.7 C), temperature source Oral, resp. rate 18, height 5\' 4"  (1.626 m), weight 177 lb (80.3 kg), SpO2 98%.    HEENT: Mild thrush at the posterior palate and posterior bilateral buccal mucosa Resp: Lungs clear bilaterally Cardio: Regular rate and rhythm GI: Diffuse tenderness at the upper abdomen with mild fullness Vascular: No leg edema  Portacath/PICC-without erythema  Lab Results:  Lab Results  Component Value Date   WBC 7.6 08/09/2023   HGB 9.9 (L) 08/09/2023   HCT 30.7 (L) 08/09/2023   MCV 85.8 08/09/2023   PLT 514 (H) 08/09/2023   NEUTROABS 6.1 08/09/2023    CMP  Lab Results  Component Value Date   NA 140 08/09/2023   K 3.7 08/09/2023   CL 97 (L) 08/09/2023   CO2 31 08/09/2023   GLUCOSE 78 08/09/2023   BUN 14 08/09/2023   CREATININE 0.82 08/09/2023   CALCIUM 9.7 08/09/2023   PROT 7.3 08/09/2023   ALBUMIN 3.3 (L) 08/09/2023   AST 13 (L) 08/09/2023   ALT 6 08/09/2023   ALKPHOS 165 (H) 08/09/2023   BILITOT 0.9 08/09/2023   GFRNONAA >60 08/09/2023   GFRAA >60 07/21/2020    Lab Results  Component Value Date   CEA1 68.8 (H) 02/23/2021   CEA <0.5 10/15/2008   QIH474 9,241 (H) 06/13/2023     Medications: I have reviewed the patient's current medications.   Assessment/Plan: Pancreas cancer -CT abdomen/pelvis without contrast 02/16/2021-multiple large hypoechoic  masses in the patient's liver consistent metastatic disease, ill-defined masslike area in the pancreatic head measuring approximate 4.7 cm, possible filling defect in the right ventricle. -EGD 02/19/2021-large infiltrative mass with bleeding in the second portion of the duodenum, appears to be arising near the ampulla, partially obstructive with friable mucosa.  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 -Flexible sigmoidoscopy 02/19/2021-diverticulosis in the sigmoid colon, nonbleeding external and internal hemorrhoids -Cardiac CT 02/20/2021-mass in the mid RV attached to the interventricular septum measuring 16 mm x 6 mm and concerning for metastatic tumor -MRI abdomen with/without contrast 02/21/2021-mass in the posterior pancreatic head measuring 4.3 x 3.8 cm with obstruction of the central pancreatic duct and diffuse dilatation up to 6 mm consistent with pancreatic adenocarcinoma, liver lesions the largest mass of the central left lobe measuring 9.9 x 9.2 cm consistent with hepatic metastatic disease,  hepatomegaly. -Ultrasound-guided biopsy of a left liver lesion 02/24/2021-adenocarcinoma, morphologically similar to the duodenal biopsy; microsatellite stable, tumor mutation burden 1 -Negative genetic testing -Palliative radiation to the duodenal mass 02/25/2021-03/06/2021 -Cycle 1 FOLFOX 03/17/2021 -Cycle 2 FOLFOX 04/01/2021 -Cycle 3 FOLFIRINOX 04/14/2021 -Cycle 4 FOLFIRINOX 04/27/2021, Udenyca added -Cycle 5 FOLFIRINOX 05/12/2021, Udenyca -MRI abdomen 05/23/2021-decrease in size of pancreas mass and hepatic lesions, no progressive disease -Cycle 6 FOLFIRINOX 05/27/2021 -CT 06/17/2021-multiple liver metastases similar to her prior exam.  Pancreas mass not significantly changed. -CT 06/26/2021-stable pancreas mass, no significant change in size and number of known liver metastases. -CT abdomen/pelvis 07/12/2021-new gastrostomy tube.  Multiple liver masses slightly decreased in size from  prior exam.  Pancreas head mass unchanged. -Cycle 1 gemcitabine/Abraxane 07/30/2021 -Cycle 2 gemcitabine/Abraxane 08/13/2021 -Cycle 3 gemcitabine 08/27/2021, Abraxane held due to significant bilateral toe pain, question neuropathy -Cycle 4 gemcitabine, Abraxane held due to neuropathy, 09/10/2021 -Cycle 5 gemcitabine, Abraxane held due to neuropathy, 09/24/2021 -Cycle 6 gemcitabine, Abraxane held due to neuropathy 10/16/2021 -CTs 10/29/2021-mild progression of multifocal liver metastases; mild increase in size of hypoenhancing mass within the head of the pancreas; new mild pleural nodularity along the posterior lower lobes -Cycle 7 gemcitabine/Abraxane 11/03/2021 -Cycle 8 gemcitabine/Abraxane 11/23/2021 -Cycle 9 gemcitabine/Abraxane 12/10/2021 -Cycle 10 Gemcitabine/Abraxane 12/24/2021 -CT abdomen/pelvis 01/04/2022-decrease size of pancreas head mass, no change in multifocal hepatic metastases -Cycle 11 gemcitabine/Abraxane 01/07/2022 -Cycle 12 gemcitabine/Abraxane 02/04/2022 -Cycle 13 Gemcitabine/Abraxane 02/18/2022 -Cycle 14 gemcitabine/Abraxane 03/11/2022 -Cycle 15 gemcitabine/Abraxane 03/29/2022 -Cycle 16 gemcitabine/Abraxane 04/16/2022 -CT abdomen/pelvis 04/22/2022-mixed response in the liver, some lesions larger, some smaller, no change in pancreas head mass, no evidence of metastatic disease elsewhere in the abdomen or pelvis -Cycle 17 gemcitabine/Abraxane 05/17/2022 -Cycle 18 Gemcitabine/Abraxane 06/01/2022 -Cycle 19 gemcitabine/Abraxane 06/18/2022 -Cycle 20 gemcitabine/Abraxane 07/05/2022 -Cycle 21 gemcitabine/Abraxane 08/09/2022 -Cycle 22 gemcitabine/Abraxane 08/23/2022 -CT abdomen/pelvis 09/01/2022-stable liver metastases, difficult to visualize the pancreas head mass, no evidence of disease progression -Cycle 23 gemcitabine/Abraxane 09/06/2022 -Cycle 24 gemcitabine/Abraxane 09/20/2022 -Cycle 25 gemcitabine/Abraxane 10/18/2022 -Cycle 26 gemcitabine/Abraxane 11/01/2022 -Cycle 27 gemcitabine/Abraxane  11/29/2022 -CT abdomen/pelvis 12/14/2022-increased size of liver metastases, less well-defined pancreas mass, 4 mm right lung nodule -Cycle 1 FOLFIRINOX 01/10/2023 -Cycle 2 FOLFIRINOX 01/24/2023, 5-FU dose reduced secondary to mucositis, chemotherapy plan dose reduce for weight loss -Develop pruritus during the oxaliplatin infusion, initial improvement with Benadryl and Solu-Medrol, oxaliplatin was resumed and the pruritus recurred.  Oxaliplatin was discontinued. -Chemotherapy held 02/07/2023 due to diarrhea, nausea/vomiting, dehydration -Cycle 3 FOLFIRINOX 03/07/2023 -Cycle 4 FOLFIRINOX 03/22/2023, 5-FU dose reduction due to mucositis, chemotherapy plan dose reduction for weight loss -CTs 03/30/2021-numerous bulky liver lesions, majority are slightly diminished in size though some lesions are unchanged or slightly enlarged. -Cycle 5 FOLFIRINOX 04/05/2023 -04/05/2023 reaction to oxaliplatin with dyspnea and pruritus -FOLFIRI every 3 weeks beginning 04/27/2023 -FOLFIRI 05/25/2023 -CTs 07/04/2023-widespread hepatic metastatic disease again noted, some of the lesions superiorly in the right lobe may be mildly progressive, direct comparison limited by lack of contrast.  Grossly unchanged pancreatic head mass.  Constipation. 2.  Iron deficiency anemia-improved -Feraheme 510 mg 02/20/2021 3.  Protein calorie malnutrition 4.  Possible right ventricular filling defect on noncontrast CT scan MRI cardiac morphology 02/21/2021-mass in the mid RV attached to the interventricular septum measures 16 mm x 6 mm.  Mass appears heterogeneous with area of enhancement on LGE imaging.  Mass is concerning for metastatic tumor.  Normal LV size and systolic function.  Normal RV size and systolic function. 5.  Hypertension 6.  History of CVA 7.  Hyperlipidemia 8.  Hypothyroidism 9.  Morbid obesity 10.  Asthma 11.  Migraines 12.  Pain secondary to #1 13.  Port-A-Cath placement 02/25/2021 14.  Hypokalemia 04/01/2021-started a  potassium supplement 15.  Hospital admission 06/17/2021-intractable nausea and vomiting EGD 06/19/2021-diffuse gastritis, nonobstructing duodenal mass, biopsy of stomach-no malignancy or H. pylori, duodenal mass-adenocarcinoma Small bowel endoscopy 08/04/2023-narrowing at the second and third portion of the duodenum, stent placed, biopsy from the duodenum negative for malignancy 16.  Hospital admission 07/12/2021 with nausea/vomiting, abdominal pain, and hypokalemia 17.  Percutaneous gastrostomy tube placement 07/01/2021 18.  07/21/2022 bilateral lower extremity venous Doppler study-negative for DVT. 19.  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  She was last treated with chemotherapy in June.  She has persistent abdominal pain and intermittent nausea/vomiting.  Dysphagia and nausea improved after she began a course of Diflucan last week. She will continue the current narcotic pain regimen.  She will begin twice daily MiraLAX for constipation.  She will call us if the MiraLAX does not relieve the constipation.  Ms. Payes will undergo a restaging CT evaluation prior to her return visit in 2 weeks.  Thornton Papas, MD  08/17/2023  12:36 PM

## 2023-08-17 NOTE — Telephone Encounter (Signed)
Spoke with Genella Rife from [facility], was informed pt has switched insurances causing her to temporarily lose her in home care services . Pt has also been newly enrolled in Medicare, Genella Rife states she is waiting on a call back from [facility] to assign pt with a new case manager  and then she will contact our office back to let us know if pt will still qualify to have in home care.

## 2023-08-18 LAB — CANCER ANTIGEN 19-9: CA 19-9: 7289 U/mL — ABNORMAL HIGH (ref 0–35)

## 2023-08-22 ENCOUNTER — Encounter: Payer: Self-pay | Admitting: Oncology

## 2023-08-24 ENCOUNTER — Ambulatory Visit (HOSPITAL_BASED_OUTPATIENT_CLINIC_OR_DEPARTMENT_OTHER)
Admission: RE | Admit: 2023-08-24 | Discharge: 2023-08-24 | Disposition: A | Discharge status: Home / Self Care | Payer: 59 | Source: Ambulatory Visit | Attending: Oncology | Admitting: Oncology

## 2023-08-24 ENCOUNTER — Inpatient Hospital Stay: Payer: 59

## 2023-08-24 DIAGNOSIS — C259 Malignant neoplasm of pancreas, unspecified: Secondary | ICD-10-CM | POA: Insufficient documentation

## 2023-08-24 MED ORDER — HEPARIN SOD (PORK) LOCK FLUSH 100 UNIT/ML IV SOLN
500.0000 [IU] | Freq: Once | INTRAVENOUS | Status: AC
  Administered 2023-08-24: 500 [IU] via INTRAVENOUS

## 2023-08-24 MED ORDER — IOHEXOL 300 MG/ML  SOLN
100.0000 mL | Freq: Once | INTRAMUSCULAR | Status: AC | PRN
  Administered 2023-08-24: 80 mL via INTRAVENOUS

## 2023-08-30 ENCOUNTER — Encounter: Payer: Self-pay | Admitting: Nurse Practitioner

## 2023-08-30 ENCOUNTER — Other Ambulatory Visit (HOSPITAL_BASED_OUTPATIENT_CLINIC_OR_DEPARTMENT_OTHER): Payer: Self-pay

## 2023-08-30 ENCOUNTER — Inpatient Hospital Stay (HOSPITAL_BASED_OUTPATIENT_CLINIC_OR_DEPARTMENT_OTHER): Payer: 59 | Admitting: Nurse Practitioner

## 2023-08-30 VITALS — BP 109/73 | HR 99 | Temp 97.7°F | Resp 18 | Ht 64.0 in | Wt 172.0 lb

## 2023-08-30 DIAGNOSIS — C250 Malignant neoplasm of head of pancreas: Secondary | ICD-10-CM | POA: Diagnosis not present

## 2023-08-30 DIAGNOSIS — C259 Malignant neoplasm of pancreas, unspecified: Secondary | ICD-10-CM

## 2023-08-30 MED ORDER — OXYCODONE HCL 10 MG PO TABS
10.0000 mg | ORAL_TABLET | ORAL | 0 refills | Status: DC | PRN
  Filled 2023-08-30: qty 60, 5d supply, fill #0

## 2023-08-30 MED ORDER — MORPHINE SULFATE (CONCENTRATE) 10 MG/0.5ML PO SOLN
20.0000 mg | Freq: Three times a day (TID) | ORAL | 0 refills | Status: DC | PRN
  Filled 2023-08-30: qty 30, 7d supply, fill #0

## 2023-08-30 NOTE — Progress Notes (Signed)
[facility] OFFICE PROGRESS NOTE   Diagnosis: Pancreas cancer  INTERVAL HISTORY:   Ms. Roopnarine returns as scheduled.  She is having periodic nausea.  No vomiting.  Significant improvement in oral intake.  She is drinking Ensure.  Bowels are moving.  She continues to have upper abdominal and back pain.  Objective:  Vital signs in last 24 hours:  Blood pressure 109/73, pulse 99, temperature 97.7 F (36.5 C), temperature source Skin, resp. rate 18, height 5\' 4"  (1.626 m), weight 172 lb (78 kg), SpO2 98%.    HEENT: A few small white patches left posterior palate. Resp: Lungs clear bilaterally. Cardio: Regular rate and rhythm. GI: Tender across the upper abdomen. Vascular: No leg edema. Port-A-Cath without erythema.  Lab Results:  Lab Results  Component Value Date   WBC 7.6 08/09/2023   HGB 9.9 (L) 08/09/2023   HCT 30.7 (L) 08/09/2023   MCV 85.8 08/09/2023   PLT 514 (H) 08/09/2023   NEUTROABS 6.1 08/09/2023    Imaging:  No results found.  Medications: I have reviewed the patient's current medications.  Assessment/Plan: Pancreas cancer -CT abdomen/pelvis without contrast 02/16/2021-multiple large hypoechoic masses in the patient's liver consistent metastatic disease, ill-defined masslike area in the pancreatic head measuring approximate 4.7 cm, possible filling defect in the right ventricle. -EGD 02/19/2021-large infiltrative mass with bleeding in the second portion of the duodenum, appears to be arising near the ampulla, partially obstructive with friable mucosa.  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 -Flexible sigmoidoscopy 02/19/2021-diverticulosis in the sigmoid colon, nonbleeding external and internal hemorrhoids -Cardiac CT 02/20/2021-mass in the mid RV attached to the interventricular septum measuring 16 mm x 6 mm and concerning for metastatic tumor -MRI abdomen with/without contrast 02/21/2021-mass in the posterior  pancreatic head measuring 4.3 x 3.8 cm with obstruction of the central pancreatic duct and diffuse dilatation up to 6 mm consistent with pancreatic adenocarcinoma, liver lesions the largest mass of the central left lobe measuring 9.9 x 9.2 cm consistent with hepatic metastatic disease,  hepatomegaly. -Ultrasound-guided biopsy of a left liver lesion 02/24/2021-adenocarcinoma, morphologically similar to the duodenal biopsy; microsatellite stable, tumor mutation burden 1 -Negative genetic testing -Palliative radiation to the duodenal mass 02/25/2021-03/06/2021 -Cycle 1 FOLFOX 03/17/2021 -Cycle 2 FOLFOX 04/01/2021 -Cycle 3 FOLFIRINOX 04/14/2021 -Cycle 4 FOLFIRINOX 04/27/2021, Udenyca added -Cycle 5 FOLFIRINOX 05/12/2021, Udenyca -MRI abdomen 05/23/2021-decrease in size of pancreas mass and hepatic lesions, no progressive disease -Cycle 6 FOLFIRINOX 05/27/2021 -CT 06/17/2021-multiple liver metastases similar to her prior exam.  Pancreas mass not significantly changed. -CT 06/26/2021-stable pancreas mass, no significant change in size and number of known liver metastases. -CT abdomen/pelvis 07/12/2021-new gastrostomy tube.  Multiple liver masses slightly decreased in size from prior exam.  Pancreas head mass unchanged. -Cycle 1 gemcitabine/Abraxane 07/30/2021 -Cycle 2 gemcitabine/Abraxane 08/13/2021 -Cycle 3 gemcitabine 08/27/2021, Abraxane held due to significant bilateral toe pain, question neuropathy -Cycle 4 gemcitabine, Abraxane held due to neuropathy, 09/10/2021 -Cycle 5 gemcitabine, Abraxane held due to neuropathy, 09/24/2021 -Cycle 6 gemcitabine, Abraxane held due to neuropathy 10/16/2021 -CTs 10/29/2021-mild progression of multifocal liver metastases; mild increase in size of hypoenhancing mass within the head of the pancreas; new mild pleural nodularity along the posterior lower lobes -Cycle 7 gemcitabine/Abraxane 11/03/2021 -Cycle 8 gemcitabine/Abraxane 11/23/2021 -Cycle 9 gemcitabine/Abraxane  12/10/2021 -Cycle 10 Gemcitabine/Abraxane 12/24/2021 -CT abdomen/pelvis 01/04/2022-decrease size of pancreas head mass, no change in multifocal hepatic metastases -Cycle 11 gemcitabine/Abraxane 01/07/2022 -Cycle 12 gemcitabine/Abraxane 02/04/2022 -Cycle 13 Gemcitabine/Abraxane 02/18/2022 -Cycle 14 gemcitabine/Abraxane 03/11/2022 -Cycle 15 gemcitabine/Abraxane 03/29/2022 -  Cycle 16 gemcitabine/Abraxane 04/16/2022 -CT abdomen/pelvis 04/22/2022-mixed response in the liver, some lesions larger, some smaller, no change in pancreas head mass, no evidence of metastatic disease elsewhere in the abdomen or pelvis -Cycle 17 gemcitabine/Abraxane 05/17/2022 -Cycle 18 Gemcitabine/Abraxane 06/01/2022 -Cycle 19 gemcitabine/Abraxane 06/18/2022 -Cycle 20 gemcitabine/Abraxane 07/05/2022 -Cycle 21 gemcitabine/Abraxane 08/09/2022 -Cycle 22 gemcitabine/Abraxane 08/23/2022 -CT abdomen/pelvis 09/01/2022-stable liver metastases, difficult to visualize the pancreas head mass, no evidence of disease progression -Cycle 23 gemcitabine/Abraxane 09/06/2022 -Cycle 24 gemcitabine/Abraxane 09/20/2022 -Cycle 25 gemcitabine/Abraxane 10/18/2022 -Cycle 26 gemcitabine/Abraxane 11/01/2022 -Cycle 27 gemcitabine/Abraxane 11/29/2022 -CT abdomen/pelvis 12/14/2022-increased size of liver metastases, less well-defined pancreas mass, 4 mm right lung nodule -Cycle 1 FOLFIRINOX 01/10/2023 -Cycle 2 FOLFIRINOX 01/24/2023, 5-FU dose reduced secondary to mucositis, chemotherapy plan dose reduce for weight loss -Develop pruritus during the oxaliplatin infusion, initial improvement with Benadryl and Solu-Medrol, oxaliplatin was resumed and the pruritus recurred.  Oxaliplatin was discontinued. -Chemotherapy held 02/07/2023 due to diarrhea, nausea/vomiting, dehydration -Cycle 3 FOLFIRINOX 03/07/2023 -Cycle 4 FOLFIRINOX 03/22/2023, 5-FU dose reduction due to mucositis, chemotherapy plan dose reduction for weight loss -CTs 03/31/2023-numerous bulky liver lesions, majority are  slightly diminished in size though some lesions are unchanged or slightly enlarged. -Cycle 5 FOLFIRINOX 04/05/2023 -04/05/2023 reaction to oxaliplatin with dyspnea and pruritus -FOLFIRI every 3 weeks beginning 04/27/2023 -FOLFIRI 05/25/2023 -CTs 07/04/2023-widespread hepatic metastatic disease again noted, some of the lesions superiorly in the right lobe may be mildly progressive, direct comparison limited by lack of contrast.  Grossly unchanged pancreatic head mass.  Constipation. -CTs 08/24/2023-no change in multifocal hepatic metastasis.  No change in primary pancreatic head mass.  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  Nodule at the left lung base medially is increased from CT abdomen 07/04/2023. 2.  Iron deficiency anemia-improved -Feraheme 510 mg 02/20/2021 3.  Protein calorie malnutrition 4.  Possible right ventricular filling defect on noncontrast CT scan MRI cardiac morphology 02/21/2021-mass in the mid RV attached to the interventricular septum measures 16 mm x 6 mm.  Mass appears heterogeneous with area of enhancement on LGE imaging.  Mass is concerning for metastatic tumor.  Normal LV size and systolic function.  Normal RV size and systolic function. 5.  Hypertension 6.  History of CVA 7.  Hyperlipidemia 8.  Hypothyroidism 9.  Morbid obesity 10.  Asthma 11.  Migraines 12.  Pain secondary to #1 13.  Port-A-Cath placement 02/25/2021 14.  Hypokalemia 04/01/2021-started a potassium supplement 15.  Hospital admission 06/17/2021-intractable nausea and vomiting EGD 06/19/2021-diffuse gastritis, nonobstructing duodenal mass, biopsy of stomach-no malignancy or H. pylori, duodenal mass-adenocarcinoma Small bowel endoscopy 08/04/2023-narrowing at the second and third portion of the duodenum, stent placed, biopsy from the duodenum negative for malignancy 16.  Hospital admission 07/12/2021 with nausea/vomiting, abdominal pain, and hypokalemia 17.  Percutaneous gastrostomy tube  placement 07/01/2021 18.  07/21/2022 bilateral lower extremity venous Doppler study-negative for DVT. 19.  Oral/esophageal candidiasis August 2024-Diflucan  Disposition: Ms. Lochmann appears stable.  She was last treated with FOLFIRI 05/25/2023.  Recent restaging CTs show overall stable disease.  A lung nodule was slightly larger.  Results/images reviewed with her at today's visit.  We reviewed options to include treatment break, resume FOLFIRI, change treatment to Gemcitabine/cisplatin.  She would like a treatment break.  She will return for follow-up in 3 to 4 weeks.  She will contact the office in the interim with any problems.  Pain medications refilled at today's visit.  Patient seen with Dr. Truett Perna.   Lonna Cobb ANP/GNP-BC   08/30/2023  11:45 AM  This was a shared visit with Lonna Cobb.  We reviewed the restaging CT findings and images with Ms. Arend.  She appears to have stable disease in the liver and pancreas.  There is evidence of progressive disease involving multiple lung nodules.  The CA 19-9 has not changed significantly over the past several months.  We discussed treatment options including observation, resuming FOLFIRI, and gemcitabine/cisplatin.  Ms. Gyamfi prefers a treatment break.  I was present for greater than 50% of today's visit.  I performed medical decision making.  Mancel Bale, MD

## 2023-09-02 ENCOUNTER — Telehealth: Payer: Self-pay

## 2023-09-02 ENCOUNTER — Other Ambulatory Visit: Payer: Self-pay

## 2023-09-02 ENCOUNTER — Encounter: Payer: Self-pay | Admitting: Family

## 2023-09-02 ENCOUNTER — Ambulatory Visit (INDEPENDENT_AMBULATORY_CARE_PROVIDER_SITE_OTHER): Payer: 59 | Admitting: Family

## 2023-09-02 VITALS — BP 110/78 | HR 64 | Resp 18 | Ht 64.0 in | Wt 174.2 lb

## 2023-09-02 DIAGNOSIS — M25562 Pain in left knee: Secondary | ICD-10-CM | POA: Diagnosis not present

## 2023-09-02 DIAGNOSIS — C250 Malignant neoplasm of head of pancreas: Secondary | ICD-10-CM

## 2023-09-02 DIAGNOSIS — K649 Unspecified hemorrhoids: Secondary | ICD-10-CM

## 2023-09-02 DIAGNOSIS — G8929 Other chronic pain: Secondary | ICD-10-CM | POA: Diagnosis not present

## 2023-09-02 MED ORDER — PROCTOFOAM HC 1-1 % EX FOAM: 1.0000 | Freq: Three times a day (TID) | CUTANEOUS | 0 refills | Status: DC

## 2023-09-02 NOTE — Progress Notes (Signed)
Wendy Hoffman is a 57 y.o. 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 06/18/2021   Hypokalemia 06/18/2021   Malnutrition of moderate degree 06/18/2021   Cancer (HCC) 06/17/2021   Cervical ca (HCC) 06/17/2021   Nausea and vomiting 06/17/2021   Genetic testing 03/23/2021   Port-A-Cath in place 03/16/2021   Goals of care, counseling/discussion 03/06/2021   Morbidly obese (HCC) 02/27/2021   Heme positive stool    Iron deficiency anemia due to chronic blood loss    Epigastric pain    Malignant neoplasm of head of pancreas (HCC) 02/17/2021   Aortic atherosclerosis (HCC) 02/17/2021   GI bleeding 02/16/2021   Class 3 obesity with current BMI at 44.30 kg/m 02/16/2021   COPD (chronic obstructive pulmonary disease) (HCC) 02/16/2021   Symptomatic anemia    Pancreatic cancer (HCC) 02/2021   Snoring 05/03/2016   Morbid obesity (HCC) 04/25/2014   Asthma exacerbation 04/22/2014   Asthma attack 04/22/2014   Asthma with acute exacerbation 11/03/2013   Hypothyroidism 11/03/2013   Tobacco abuse 11/03/2013   Migraine 09/22/2012   Weakness of right side of body 09/22/2012   Thyroid disease    Hypertension    High cholesterol    H/O: hysterectomy    Asthma 11/26/2008   HEADACHE, CHRONIC 11/26/2008    Current Outpatient Medications  Medication Sig Dispense Refill   albuterol (VENTOLIN HFA) 108 (90 Base) MCG/ACT inhaler INHALE 1 TO 2 PUFFS BY MOUTH EVERY 6 HOURS AS NEEDED FOR WHEEZING AND FOR SHORTNESS OF BREATH 54 g 0   bisacodyl (DULCOLAX) 5 MG EC tablet Take 2 tablets (10 mg total) by mouth daily as needed for moderate constipation. 60 tablet 2   DULoxetine (CYMBALTA) 30 MG capsule TAKE 2  CAPSULES BY MOUTH DAILY 180 capsule 0   gabapentin (NEURONTIN) 300 MG capsule Take 1 capsule (300 mg total) by mouth at bedtime. 90 capsule 0   hydrocortisone-pramoxine (PROCTOFOAM HC) rectal foam Place 1 applicator rectally 3 (three) times daily. 10 g 0   lidocaine-prilocaine (EMLA) cream Apply 1 tablespoon to port site 2 hours prior to stick to numb site. 30 g 2   magic mouthwash (nystatin, diphenhydrAMINE, alum & mag hydroxide) suspension mixture Swish 5 mL for 1 minute and spit four times daily as needed for mouth pain. 240 mL 1   morphine (MS CONTIN) 60 MG 12 hr tablet Take 1 tablet (60 mg total) by mouth every 12 (twelve) hours. 60 tablet 0   Morphine Sulfate (MORPHINE CONCENTRATE) 10 MG/0.5ML SOLN concentrated solution Place 1-1.5 mLs (20-30 mg total) under the tongue every 8 (eight) hours as needed for severe pain (pain not relieved with oxycodone). 30 mL 0   ondansetron (ZOFRAN) 8 MG tablet Take 1 tablet (8 mg total) by mouth every 8 (eight) hours as needed for nausea or vomiting. 90 tablet 0   ondansetron (ZOFRAN-ODT) 8 MG disintegrating tablet Take 1 tablet (8 mg total) by mouth every 8 (eight) hours as needed for nausea or vomiting. 90 tablet 0   Oxycodone HCl 10 MG TABS Take 1-2 tablets (10-20 mg total) by mouth every 4 (four) hours as needed (Pain). 60 tablet 0   pantoprazole (PROTONIX) 40 MG tablet TAKE 1 TABLET(40 MG) BY  MOUTH TWICE DAILY 180 tablet 0   potassium chloride (KLOR-CON M) 10 MEQ tablet TAKE 1 TABLET BY MOUTH DAILY AS DIRECTED WHEN USING FUROSEMIDE 90 tablet 0   predniSONE (DELTASONE) 10 MG tablet Take 1 tablet (10 mg total) by mouth daily with breakfast. 30 tablet 0   prochlorperazine (COMPAZINE) 10 MG tablet TAKE 1 TABLET(10 MG) BY MOUTH EVERY 6 HOURS AS NEEDED FOR NAUSEA OR VOMITING 120 tablet 0   scopolamine (TRANSDERM-SCOP) 1 MG/3DAYS Place 1 patch (1.5 mg total) onto the skin every 3 (three) days. 30 patch 0   SYMBICORT 160-4.5 MCG/ACT inhaler INHALE 2 PUFFS INTO THE  LUNGS TWICE DAILY 10.2 g 0   zolpidem (AMBIEN) 10 MG tablet Take 1 tablet (10 mg total) by mouth at bedtime as needed for sleep. 30 tablet 0   furosemide (LASIX) 20 MG tablet Take 1 tablet (20 mg total) by mouth 2 (two) times daily as needed for edema. (Patient not taking: Reported on 08/17/2023) 60 tablet 1   No current facility-administered medications for this visit.    Allergies: Blueberry [vaccinium angustifolium], Mango flavor, Oxaliplatin, Reglan [metoclopramide], Aspirin, and Penicillins  Past Medical History:  Diagnosis Date   Anemia    Anxiety    ARF (acute renal failure) (HCC) 06/18/2021   Arthritis    Asthma    Cervical ca (HCC)    H/O: hysterectomy    High cholesterol    Hypertension    Hypothyroidism    Insomnia    Medical history non-contributory    Migraine    Morbid obesity (HCC)    Pancreatic cancer (HCC) 02/2021   Shortness of breath    Stroke [facility])    Thyroid disease     Past Surgical History:  Procedure Laterality Date   ABDOMINAL HYSTERECTOMY     BIOPSY  02/19/2021   Procedure: BIOPSY;  Surgeon: Napoleon Form, MD;  Location: [facility];  Service: Endoscopy;;   BIOPSY  06/19/2021   Procedure: BIOPSY;  Surgeon: Kathi Der, MD;  Location: [facility];  Service: Gastroenterology;;   BIOPSY  08/04/2023   Procedure: BIOPSY;  Surgeon: Lemar Lofty., MD;  Location: [facility];  Service: Gastroenterology;;   CESAREAN SECTION     3 occasions   CHOLECYSTECTOMY     DUODENAL STENT PLACEMENT N/A 08/04/2023   Procedure: DUODENAL STENT PLACEMENT;  Surgeon: Lemar Lofty., MD;  Location: [facility];  Service: Gastroenterology;  Laterality: N/A;   ENTEROSCOPY N/A 08/04/2023   Procedure: ENTEROSCOPY;  Surgeon: Meridee Score Netty Starring., MD;  Location: [facility];  Service: Gastroenterology;  Laterality: N/A;   ESOPHAGEAL BRUSHING  08/04/2023   Procedure: ESOPHAGEAL BRUSHING;  Surgeon: Lemar Lofty., MD;  Location: [facility];   Service: Gastroenterology;;   ESOPHAGOGASTRODUODENOSCOPY (EGD) WITH PROPOFOL N/A 02/19/2021   Procedure: ESOPHAGOGASTRODUODENOSCOPY (EGD) WITH PROPOFOL;  Surgeon: Napoleon Form, MD;  Location: [facility];  Service: Endoscopy;  Laterality: N/A;   ESOPHAGOGASTRODUODENOSCOPY (EGD) WITH PROPOFOL N/A 06/19/2021   Procedure: ESOPHAGOGASTRODUODENOSCOPY (EGD) WITH PROPOFOL;  Surgeon: Kathi Der, MD;  Location: [facility];  Service: Gastroenterology;  Laterality: N/A;   FLEXIBLE SIGMOIDOSCOPY N/A 02/19/2021   Procedure: FLEXIBLE SIGMOIDOSCOPY;  Surgeon: Napoleon Form, MD;  Location: [facility];  Service: Endoscopy;  Laterality: N/A;   IR GASTROSTOMY TUBE MOD SED  07/01/2021   IR GASTROSTOMY TUBE REMOVAL  08/21/2021   IR IMAGING GUIDED PORT INSERTION  02/24/2021   IR US GUIDE BX ASP/DRAIN  02/24/2021   THYROIDECTOMY, PARTIAL     Goiter  Family History  Problem Relation Age of Onset   Migraines Sister    Non-Hodgkin's lymphoma Other        maternal half brother's grandson   Non-Hodgkin's lymphoma Half-Brother        maternal half brother; dx 36s or 81s   Pancreatic cancer Other 32       MGF's niece    Social History   Tobacco Use   Smoking status: Former    Types: Cigarettes   Smokeless tobacco: Never  Substance Use Topics   Alcohol use: No    Subjective:   Patient is concerned about possible hemorrhoid- has been having some rectal itching/ discomfort for the past few months;  Visit had been originally scheduled to discuss depression but during course of visit, staff from her oncology team called and wanted to get her set up with their counselor;  Also asking about referral to orthopedic to deal with chronic left knee pain;    Objective:  Vitals:   09/02/23 1257  BP: 110/78  Pulse: 64  Resp: 18  SpO2: 98%  Weight: 174 lb 3.2 oz (79 kg)  Height: 5\' 4"  (1.626 m)    General: Well developed, well nourished, in no acute distress  Skin : Warm and dry.  Head:  Normocephalic and atraumatic  Lungs: Respirations unlabored;  Musculoskeletal: No deformities; no active joint inflammation  Extremities: No edema, cyanosis, clubbing  Vessels: Symmetric bilaterally  Neurologic: Alert and oriented; speech intact; face symmetrical; moves all extremities well; CNII-XII intact without focal deficit   Assessment:  1. Chronic pain of left knee   2. Hemorrhoids, unspecified hemorrhoid type     Plan:  Referral to orthopedist; Will try treating with Proctofoam HC tid prn- if symptoms persist, she will need to discuss with her oncologist at [facility] in November;  She will reach out to her oncology team who will get her set up with therapist to help manage anxiety/ depression.   No follow-ups on file.  Orders Placed This Encounter  Procedures   Ambulatory referral to Orthopedic Surgery    Referral Priority:   Routine    Referral Type:   Surgical    Referral Reason:   Specialty Services Required    Requested Specialty:   Orthopedic Surgery    Number of Visits Requested:   1    Requested Prescriptions   Signed Prescriptions Disp Refills   hydrocortisone-pramoxine (PROCTOFOAM HC) rectal foam 10 g 0    Sig: Place 1 applicator rectally 3 (three) times daily.

## 2023-09-02 NOTE — Telephone Encounter (Signed)
-----   Message from Lonna Cobb sent at 09/02/2023 12:40 PM EDT ----- Please call her.  Her PCP sent me a message this morning that she has been referred to them for counseling/depression evaluation.  PCP wonders if we offer counseling services here.  Can you have Larita Fife, SW call her please?  Thanks

## 2023-09-02 NOTE — Telephone Encounter (Signed)
I placed a referral to Child psychotherapist.

## 2023-09-03 ENCOUNTER — Other Ambulatory Visit: Payer: Self-pay | Admitting: Hematology and Oncology

## 2023-09-03 MED ORDER — NYSTATIN 100000 UNIT/ML MT SUSP: OROMUCOSAL | 1 refills | Status: AC

## 2023-09-05 ENCOUNTER — Other Ambulatory Visit (HOSPITAL_BASED_OUTPATIENT_CLINIC_OR_DEPARTMENT_OTHER): Payer: Self-pay

## 2023-09-05 ENCOUNTER — Other Ambulatory Visit: Payer: Self-pay | Admitting: Nurse Practitioner

## 2023-09-05 ENCOUNTER — Telehealth: Payer: Self-pay | Admitting: *Deleted

## 2023-09-05 ENCOUNTER — Other Ambulatory Visit: Payer: Self-pay

## 2023-09-05 DIAGNOSIS — C259 Malignant neoplasm of pancreas, unspecified: Secondary | ICD-10-CM

## 2023-09-05 DIAGNOSIS — B370 Candidal stomatitis: Secondary | ICD-10-CM

## 2023-09-05 DIAGNOSIS — C250 Malignant neoplasm of head of pancreas: Secondary | ICD-10-CM

## 2023-09-05 MED ORDER — MORPHINE SULFATE ER 60 MG PO TBCR
60.0000 mg | EXTENDED_RELEASE_TABLET | Freq: Two times a day (BID) | ORAL | 0 refills | Status: DC
  Filled 2023-09-05 – 2023-09-06 (×3): qty 60, 30d supply, fill #0

## 2023-09-05 MED ORDER — OXYCODONE HCL 10 MG PO TABS
10.0000 mg | ORAL_TABLET | ORAL | 0 refills | Status: DC | PRN
  Filled 2023-09-05: qty 60, 5d supply, fill #0

## 2023-09-05 MED ORDER — FLUCONAZOLE 100 MG PO TABS
100.0000 mg | ORAL_TABLET | Freq: Every day | ORAL | 0 refills | Status: AC
Start: 2023-09-05 — End: 2023-09-12
  Filled 2023-09-05 (×2): qty 7, 7d supply, fill #0

## 2023-09-05 MED ORDER — ZOLPIDEM TARTRATE 10 MG PO TABS
10.0000 mg | ORAL_TABLET | Freq: Every evening | ORAL | 0 refills | Status: DC | PRN
Start: 2023-09-05 — End: 2023-10-06
  Filled 2023-09-05 (×2): qty 30, 30d supply, fill #0

## 2023-09-05 NOTE — Telephone Encounter (Addendum)
She called back to report that she has thrush in her mouth again (just completed 10 days of fluconazole on 9/13). Also having acid reflux saying "everything burns going down". She is taking pantoprazole 40 mg bid.  Per Wendy Cobb, NP: Will send in 7 day course of fluconazole. Instructed her to obtain Preparation H or Anusol ointment/suppository for hemorrhoids as well as use a sitz bath daily. She corrected that she does not need her MS liquid at this time

## 2023-09-05 NOTE — Telephone Encounter (Signed)
Called to request refill on zolpidem and pain meds: MS Contin, Oxycodone and MS liquid Also has painful external hemorrhoids and asking for prescription for this as well. Would prefer all be sent to [facility] please.

## 2023-09-06 ENCOUNTER — Other Ambulatory Visit: Payer: Self-pay

## 2023-09-06 ENCOUNTER — Other Ambulatory Visit (HOSPITAL_BASED_OUTPATIENT_CLINIC_OR_DEPARTMENT_OTHER): Payer: Self-pay

## 2023-09-07 ENCOUNTER — Other Ambulatory Visit: Payer: Self-pay

## 2023-09-09 ENCOUNTER — Telehealth: Payer: Self-pay | Admitting: *Deleted

## 2023-09-09 ENCOUNTER — Other Ambulatory Visit (HOSPITAL_BASED_OUTPATIENT_CLINIC_OR_DEPARTMENT_OTHER): Payer: Self-pay

## 2023-09-09 NOTE — Telephone Encounter (Signed)
Patient left VM requesting refill on MS Contin. Only has #3 pills on hand. Noted script was sent to [facility] on 9/23-confirmed w/pharmacy is was received and is ready for pick up at [facility]. It was sent there because they do not have enough drug in stock to fill it. If she is not able to get to [facility], she will need to call them to discuss plan. Called [place] and left this message on her VM

## 2023-09-10 ENCOUNTER — Other Ambulatory Visit: Payer: Self-pay | Admitting: Oncology

## 2023-09-10 DIAGNOSIS — J4540 Moderate persistent asthma, uncomplicated: Secondary | ICD-10-CM

## 2023-09-12 ENCOUNTER — Encounter: Payer: Self-pay | Admitting: Oncology

## 2023-09-15 ENCOUNTER — Telehealth: Payer: Self-pay

## 2023-09-15 ENCOUNTER — Telehealth: Payer: Self-pay | Admitting: *Deleted

## 2023-09-15 ENCOUNTER — Other Ambulatory Visit (HOSPITAL_BASED_OUTPATIENT_CLINIC_OR_DEPARTMENT_OTHER): Payer: Self-pay

## 2023-09-15 DIAGNOSIS — C250 Malignant neoplasm of head of pancreas: Secondary | ICD-10-CM

## 2023-09-15 MED ORDER — FLUCONAZOLE 100 MG PO TABS
100.0000 mg | ORAL_TABLET | Freq: Every day | ORAL | 0 refills | Status: DC
  Filled 2023-09-15: qty 7, 7d supply, fill #0

## 2023-09-15 NOTE — Telephone Encounter (Signed)
Clinical Social Work was referred by medical provider to discuss counseling needs.  CSW attempted to contact patient by phone.  Left voicemail with contact information and request for return call.

## 2023-09-15 NOTE — Telephone Encounter (Signed)
Patient having N/V today and has thrush in her mouth, tongue and lips. Also reports increased abdominal pain. She confirms she is taking all her antiemetics as ordered and protonix. Had adequate BM yesterday. She feels she needs to be seen and get back on treatment. Per Lonna Cobb, reorder fluconazole and will see her tomorrow w/labs

## 2023-09-16 ENCOUNTER — Inpatient Hospital Stay (HOSPITAL_BASED_OUTPATIENT_CLINIC_OR_DEPARTMENT_OTHER): Payer: 59 | Admitting: Nurse Practitioner

## 2023-09-16 ENCOUNTER — Other Ambulatory Visit (HOSPITAL_BASED_OUTPATIENT_CLINIC_OR_DEPARTMENT_OTHER): Payer: Self-pay

## 2023-09-16 ENCOUNTER — Encounter: Payer: Self-pay | Admitting: Nurse Practitioner

## 2023-09-16 ENCOUNTER — Inpatient Hospital Stay: Payer: 59

## 2023-09-16 ENCOUNTER — Encounter: Payer: Self-pay | Admitting: Oncology

## 2023-09-16 ENCOUNTER — Inpatient Hospital Stay: Payer: 59 | Attending: Nurse Practitioner

## 2023-09-16 VITALS — BP 118/81 | HR 106 | Temp 98.3°F | Ht 64.0 in | Wt 168.6 lb

## 2023-09-16 DIAGNOSIS — C787 Secondary malignant neoplasm of liver and intrahepatic bile duct: Secondary | ICD-10-CM | POA: Diagnosis not present

## 2023-09-16 DIAGNOSIS — Z8673 Personal history of transient ischemic attack (TIA), and cerebral infarction without residual deficits: Secondary | ICD-10-CM | POA: Insufficient documentation

## 2023-09-16 DIAGNOSIS — R109 Unspecified abdominal pain: Secondary | ICD-10-CM | POA: Diagnosis not present

## 2023-09-16 DIAGNOSIS — G893 Neoplasm related pain (acute) (chronic): Secondary | ICD-10-CM | POA: Insufficient documentation

## 2023-09-16 DIAGNOSIS — C250 Malignant neoplasm of head of pancreas: Secondary | ICD-10-CM

## 2023-09-16 DIAGNOSIS — E46 Unspecified protein-calorie malnutrition: Secondary | ICD-10-CM | POA: Insufficient documentation

## 2023-09-16 DIAGNOSIS — B370 Candidal stomatitis: Secondary | ICD-10-CM | POA: Insufficient documentation

## 2023-09-16 DIAGNOSIS — Z5111 Encounter for antineoplastic chemotherapy: Secondary | ICD-10-CM | POA: Diagnosis present

## 2023-09-16 DIAGNOSIS — E039 Hypothyroidism, unspecified: Secondary | ICD-10-CM | POA: Diagnosis not present

## 2023-09-16 DIAGNOSIS — E785 Hyperlipidemia, unspecified: Secondary | ICD-10-CM | POA: Diagnosis not present

## 2023-09-16 DIAGNOSIS — D509 Iron deficiency anemia, unspecified: Secondary | ICD-10-CM | POA: Insufficient documentation

## 2023-09-16 DIAGNOSIS — I10 Essential (primary) hypertension: Secondary | ICD-10-CM | POA: Insufficient documentation

## 2023-09-16 DIAGNOSIS — G43909 Migraine, unspecified, not intractable, without status migrainosus: Secondary | ICD-10-CM | POA: Insufficient documentation

## 2023-09-16 DIAGNOSIS — R112 Nausea with vomiting, unspecified: Secondary | ICD-10-CM | POA: Insufficient documentation

## 2023-09-16 DIAGNOSIS — R7989 Other specified abnormal findings of blood chemistry: Secondary | ICD-10-CM | POA: Insufficient documentation

## 2023-09-16 DIAGNOSIS — C259 Malignant neoplasm of pancreas, unspecified: Secondary | ICD-10-CM

## 2023-09-16 DIAGNOSIS — J45909 Unspecified asthma, uncomplicated: Secondary | ICD-10-CM | POA: Diagnosis not present

## 2023-09-16 DIAGNOSIS — R918 Other nonspecific abnormal finding of lung field: Secondary | ICD-10-CM | POA: Insufficient documentation

## 2023-09-16 DIAGNOSIS — Z95828 Presence of other vascular implants and grafts: Secondary | ICD-10-CM

## 2023-09-16 LAB — CBC WITH DIFFERENTIAL (CANCER CENTER ONLY)
Abs Immature Granulocytes: 0.05 10*3/uL (ref 0.00–0.07)
Basophils Absolute: 0 10*3/uL (ref 0.0–0.1)
Basophils Relative: 0 %
Eosinophils Absolute: 0 10*3/uL (ref 0.0–0.5)
Eosinophils Relative: 1 %
HCT: 27.8 % — ABNORMAL LOW (ref 36.0–46.0)
Hemoglobin: 8.6 g/dL — ABNORMAL LOW (ref 12.0–15.0)
Immature Granulocytes: 1 %
Lymphocytes Relative: 14 %
Lymphs Abs: 1.2 10*3/uL (ref 0.7–4.0)
MCH: 25.7 pg — ABNORMAL LOW (ref 26.0–34.0)
MCHC: 30.9 g/dL (ref 30.0–36.0)
MCV: 83.2 fL (ref 80.0–100.0)
Monocytes Absolute: 0.7 10*3/uL (ref 0.1–1.0)
Monocytes Relative: 8 %
Neutro Abs: 6.6 10*3/uL (ref 1.7–7.7)
Neutrophils Relative %: 76 %
Platelet Count: 439 10*3/uL — ABNORMAL HIGH (ref 150–400)
RBC: 3.34 MIL/uL — ABNORMAL LOW (ref 3.87–5.11)
RDW: 19 % — ABNORMAL HIGH (ref 11.5–15.5)
WBC Count: 8.6 10*3/uL (ref 4.0–10.5)
nRBC: 0 % (ref 0.0–0.2)

## 2023-09-16 LAB — CMP (CANCER CENTER ONLY)
ALT: 8 U/L (ref 0–44)
AST: 17 U/L (ref 15–41)
Albumin: 3.1 g/dL — ABNORMAL LOW (ref 3.5–5.0)
Alkaline Phosphatase: 141 U/L — ABNORMAL HIGH (ref 38–126)
Anion gap: 8 (ref 5–15)
BUN: 23 mg/dL — ABNORMAL HIGH (ref 6–20)
CO2: 31 mmol/L (ref 22–32)
Calcium: 9.5 mg/dL (ref 8.9–10.3)
Chloride: 96 mmol/L — ABNORMAL LOW (ref 98–111)
Creatinine: 1.05 mg/dL — ABNORMAL HIGH (ref 0.44–1.00)
GFR, Estimated: >60 mL/min (ref >60)
Glucose, Bld: 110 mg/dL — ABNORMAL HIGH (ref 70–99)
Potassium: 4.3 mmol/L (ref 3.5–5.1)
Sodium: 135 mmol/L (ref 135–145)
Total Bilirubin: 0.7 mg/dL (ref 0.3–1.2)
Total Protein: 7.3 g/dL (ref 6.5–8.1)

## 2023-09-16 MED ORDER — MORPHINE SULFATE (CONCENTRATE) 10 MG/0.5ML PO SOLN
20.0000 mg | Freq: Three times a day (TID) | ORAL | 0 refills | Status: DC | PRN
Start: 2023-09-16 — End: 2023-10-06
  Filled 2023-09-16: qty 30, 7d supply, fill #0

## 2023-09-16 MED ORDER — HEPARIN SOD (PORK) LOCK FLUSH 100 UNIT/ML IV SOLN
500.0000 [IU] | Freq: Once | INTRAVENOUS | Status: AC
  Administered 2023-09-16: 500 [IU] via INTRAVENOUS

## 2023-09-16 MED ORDER — OXYCODONE HCL 10 MG PO TABS
10.0000 mg | ORAL_TABLET | ORAL | 0 refills | Status: DC | PRN
  Filled 2023-09-16: qty 60, 5d supply, fill #0

## 2023-09-16 MED ORDER — SODIUM CHLORIDE 0.9% FLUSH
10.0000 mL | INTRAVENOUS | Status: DC | PRN
  Administered 2023-09-16: 10 mL via INTRAVENOUS

## 2023-09-16 MED ORDER — SODIUM CHLORIDE 0.9% FLUSH
10.0000 mL | Freq: Once | INTRAVENOUS | Status: AC
  Administered 2023-09-16: 10 mL

## 2023-09-16 NOTE — Progress Notes (Signed)
DISCONTINUE OFF PATHWAY REGIMEN - Pancreatic Adenocarcinoma   [id number]:mFOLFIRINOX (Fluorouracil 2,400 mg/m2/cycle; Irinotecan 150 mg/m2) q14 Days:   A cycle is every 14 days:     Irinotecan      Oxaliplatin      Leucovorin      Fluorouracil   **Always confirm dose/schedule in your pharmacy ordering system**  REASON: Disease Progression PRIOR TREATMENT: Off Pathway: mFOLFIRINOX (Fluorouracil 2,400 mg/m2/cycle; Irinotecan 150 mg/m2) q14 Days TREATMENT RESPONSE: Stable Disease (SD)  START OFF PATHWAY REGIMEN - Pancreatic Adenocarcinoma   [id number]:Cisplatin 25 mg/m2 IV D1,8,15 + Gemcitabine 1,000 mg/m2 IV D1,8,15 q28 Days:   A cycle is every 28 days:     Cisplatin      Gemcitabine   **Always confirm dose/schedule in your pharmacy ordering system**  Patient Characteristics: Metastatic Disease, Third Line and Beyond, MSS/pMMR or MSI Unknown Therapeutic Status: Metastatic Disease Line of Therapy: [employer] Energy manager Status: MSS/pMMR Intent of Therapy: Non-Curative / Palliative Intent, Discussed with Patient

## 2023-09-16 NOTE — Addendum Note (Signed)
Addended by: Dimitri Ped on: 09/16/2023 01:01 PM   Modules accepted: Orders

## 2023-09-16 NOTE — Progress Notes (Signed)
[facility] OFFICE PROGRESS NOTE   Diagnosis: Pancreas cancer  INTERVAL HISTORY:   Ms. Shimmel returns prior to scheduled follow-up.  She contacted the office yesterday requesting an appointment to discuss resuming treatment.  She reports significant increase in abdominal pain for the past week.  Recurrent nausea/vomiting.  Poor appetite.  Had a small bowel movement yesterday, overall has been constipated.  No bleeding she is aware of.  Pain is located at the upper abdomen, also felt in the back.  She takes liquid morphine as needed and oxycodone 10 mg 1 to 3 tablets as needed.  Objective:  Vital signs in last 24 hours:  Blood pressure 118/81, pulse (!) 106, temperature 98.3 F (36.8 C), temperature source Oral, height 5\' 4"  (1.626 m), weight 168 lb 9.6 oz (76.5 kg), SpO2 100%.    HEENT: Marked oral thrush. Resp: Lungs clear bilaterally. Cardio: Regular rate and rhythm. GI: Tender across the upper abdomen.  Firm fullness throughout the right upper abdomen. Vascular: No leg edema. Neuro: Alert and oriented. Port-A-Cath without erythema.  Lab Results:  Lab Results  Component Value Date   WBC 8.6 09/16/2023   HGB 8.6 (L) 09/16/2023   HCT 27.8 (L) 09/16/2023   MCV 83.2 09/16/2023   PLT 439 (H) 09/16/2023   NEUTROABS 6.6 09/16/2023    Imaging:  No results found.  Medications: I have reviewed the patient's current medications.  Assessment/Plan: Pancreas cancer -CT abdomen/pelvis without contrast 02/16/2021-multiple large hypoechoic masses in the patient's liver consistent metastatic disease, ill-defined masslike area in the pancreatic head measuring approximate 4.7 cm, possible filling defect in the right ventricle. -EGD 02/19/2021-large infiltrative mass with bleeding in the second portion of the duodenum, appears to be arising near the ampulla, partially obstructive with friable mucosa.  Duodenal mass biopsy-adenocarcinoma, moderate to poorly  differentiated -Flexible sigmoidoscopy 02/19/2021-diverticulosis in the sigmoid colon, nonbleeding external and internal hemorrhoids -Cardiac CT 02/20/2021-mass in the mid RV attached to the interventricular septum measuring 16 mm x 6 mm and concerning for metastatic tumor -MRI abdomen with/without contrast 02/21/2021-mass in the posterior pancreatic head measuring 4.3 x 3.8 cm with obstruction of the central pancreatic duct and diffuse dilatation up to 6 mm consistent with pancreatic adenocarcinoma, liver lesions the largest mass of the central left lobe measuring 9.9 x 9.2 cm consistent with hepatic metastatic disease,  hepatomegaly. -Ultrasound-guided biopsy of a left liver lesion 02/24/2021-adenocarcinoma, morphologically similar to the duodenal biopsy; microsatellite stable, tumor mutation burden 1 -Negative genetic testing -Palliative radiation to the duodenal mass 02/25/2021-03/06/2021 -Cycle 1 FOLFOX 03/17/2021 -Cycle 2 FOLFOX 04/01/2021 -Cycle 3 FOLFIRINOX 04/14/2021 -Cycle 4 FOLFIRINOX 04/27/2021, Udenyca added -Cycle 5 FOLFIRINOX 05/12/2021, Udenyca -MRI abdomen 05/23/2021-decrease in size of pancreas mass and hepatic lesions, no progressive disease -Cycle 6 FOLFIRINOX 05/27/2021 -CT 06/17/2021-multiple liver metastases similar to her prior exam.  Pancreas mass not significantly changed. -CT 06/26/2021-stable pancreas mass, no significant change in size and number of known liver metastases. -CT abdomen/pelvis 07/12/2021-new gastrostomy tube.  Multiple liver masses slightly decreased in size from prior exam.  Pancreas head mass unchanged. -Cycle 1 gemcitabine/Abraxane 07/30/2021 -Cycle 2 gemcitabine/Abraxane 08/13/2021 -Cycle 3 gemcitabine 08/27/2021, Abraxane held due to significant bilateral toe pain, question neuropathy -Cycle 4 gemcitabine, Abraxane held due to neuropathy, 09/10/2021 -Cycle 5 gemcitabine, Abraxane held due to neuropathy, 09/24/2021 -Cycle 6 gemcitabine, Abraxane held due to neuropathy  10/16/2021 -CTs 10/29/2021-mild progression of multifocal liver metastases; mild increase in size of hypoenhancing mass within the head of the pancreas; new mild pleural nodularity along  the posterior lower lobes -Cycle 7 gemcitabine/Abraxane 11/03/2021 -Cycle 8 gemcitabine/Abraxane 11/23/2021 -Cycle 9 gemcitabine/Abraxane 12/10/2021 -Cycle 10 Gemcitabine/Abraxane 12/24/2021 -CT abdomen/pelvis 01/04/2022-decrease size of pancreas head mass, no change in multifocal hepatic metastases -Cycle 11 gemcitabine/Abraxane 01/07/2022 -Cycle 12 gemcitabine/Abraxane 02/04/2022 -Cycle 13 Gemcitabine/Abraxane 02/18/2022 -Cycle 14 gemcitabine/Abraxane 03/11/2022 -Cycle 15 gemcitabine/Abraxane 03/29/2022 -Cycle 16 gemcitabine/Abraxane 04/16/2022 -CT abdomen/pelvis 04/22/2022-mixed response in the liver, some lesions larger, some smaller, no change in pancreas head mass, no evidence of metastatic disease elsewhere in the abdomen or pelvis -Cycle 17 gemcitabine/Abraxane 05/17/2022 -Cycle 18 Gemcitabine/Abraxane 06/01/2022 -Cycle 19 gemcitabine/Abraxane 06/18/2022 -Cycle 20 gemcitabine/Abraxane 07/05/2022 -Cycle 21 gemcitabine/Abraxane 08/09/2022 -Cycle 22 gemcitabine/Abraxane 08/23/2022 -CT abdomen/pelvis 09/01/2022-stable liver metastases, difficult to visualize the pancreas head mass, no evidence of disease progression -Cycle 23 gemcitabine/Abraxane 09/06/2022 -Cycle 24 gemcitabine/Abraxane 09/20/2022 -Cycle 25 gemcitabine/Abraxane 10/18/2022 -Cycle 26 gemcitabine/Abraxane 11/01/2022 -Cycle 27 gemcitabine/Abraxane 11/29/2022 -CT abdomen/pelvis 12/14/2022-increased size of liver metastases, less well-defined pancreas mass, 4 mm right lung nodule -Cycle 1 FOLFIRINOX 01/10/2023 -Cycle 2 FOLFIRINOX 01/24/2023, 5-FU dose reduced secondary to mucositis, chemotherapy plan dose reduce for weight loss -Develop pruritus during the oxaliplatin infusion, initial improvement with Benadryl and Solu-Medrol, oxaliplatin was resumed and the  pruritus recurred.  Oxaliplatin was discontinued. -Chemotherapy held 02/07/2023 due to diarrhea, nausea/vomiting, dehydration -Cycle 3 FOLFIRINOX 03/07/2023 -Cycle 4 FOLFIRINOX 03/22/2023, 5-FU dose reduction due to mucositis, chemotherapy plan dose reduction for weight loss -CTs 03/31/2023-numerous bulky liver lesions, majority are slightly diminished in size though some lesions are unchanged or slightly enlarged. -Cycle 5 FOLFIRINOX 04/05/2023 -04/05/2023 reaction to oxaliplatin with dyspnea and pruritus -FOLFIRI every 3 weeks beginning 04/27/2023 -FOLFIRI 05/25/2023 -CTs 07/04/2023-widespread hepatic metastatic disease again noted, some of the lesions superiorly in the right lobe may be mildly progressive, direct comparison limited by lack of contrast.  Grossly unchanged pancreatic head mass.  Constipation. -CTs 08/24/2023-no change in multifocal hepatic metastasis.  No change in primary pancreatic head mass.  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  Nodule at the left lung base medially is increased from CT abdomen 07/04/2023. 2.  Iron deficiency anemia-improved -Feraheme 510 mg 02/20/2021 3.  Protein calorie malnutrition 4.  Possible right ventricular filling defect on noncontrast CT scan MRI cardiac morphology 02/21/2021-mass in the mid RV attached to the interventricular septum measures 16 mm x 6 mm.  Mass appears heterogeneous with area of enhancement on LGE imaging.  Mass is concerning for metastatic tumor.  Normal LV size and systolic function.  Normal RV size and systolic function. 5.  Hypertension 6.  History of CVA 7.  Hyperlipidemia 8.  Hypothyroidism 9.  Morbid obesity 10.  Asthma 11.  Migraines 12.  Pain secondary to #1 13.  Port-A-Cath placement 02/25/2021 14.  Hypokalemia 04/01/2021-started a potassium supplement 15.  Hospital admission 06/17/2021-intractable nausea and vomiting EGD 06/19/2021-diffuse gastritis, nonobstructing duodenal mass, biopsy of stomach-no  malignancy or H. pylori, duodenal mass-adenocarcinoma Small bowel endoscopy 08/04/2023-narrowing at the second and third portion of the duodenum, stent placed, biopsy from the duodenum negative for malignancy 16.  Hospital admission 07/12/2021 with nausea/vomiting, abdominal pain, and hypokalemia 17.  Percutaneous gastrostomy tube placement 07/01/2021 18.  07/21/2022 bilateral lower extremity venous Doppler study-negative for DVT. 19.  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  She last had chemotherapy June 2024.  Restaging CTs 08/24/2023 showed stable disease.  She elected a treatment break.  She presents today with worsening upper abdominal pain and recurrent nausea/vomiting.  She understands the pain is likely due to progression of the  cancer.  She also has extensive oral candidiasis which may be contributing.  She would like to resume systemic therapy.  We had previously discussed retreatment with FOLFIRI versus switching to Gemcitabine/cisplatin.  She would like to begin gemcitabine/cisplatin, plan for a 2-week schedule.  She has been treated in the past with Gemcitabine and is aware of the side effects.  We reviewed potential side effects associated with cisplatin including bone marrow toxicity, nausea, hair loss, neuropathy, nephropathy, ototoxicity.  She reacted to oxaliplatin with dyspnea and pruritus.  She understands there is potential for cross-reactivity with cisplatin.  She agrees to proceed.  We are referring her for restaging CT scans.  She would like to begin treatment on 09/26/2023.  We will see her in follow-up prior to treatment that day.  Patient seen with Dr. Truett Perna.  Lonna Cobb ANP/GNP-BC   09/16/2023  12:13 PM This was a shared visit with Lonna Cobb.  Ms. Solan was interviewed and examined.  Her clinical status has declined.  Her symptoms are likely in large part related to progression of the pancreas cancer.   We discussed treatment options.  She would like to resume systemic therapy.  We discussed FOLFIRI and gemcitabine/cisplatin.  We reviewed potential toxicities associated with cisplatin including the chance of progressive neuropathy and an allergic reaction.  She would like to proceed with gemcitabine/cisplatin.  She will complete a course of Diflucan for the oropharyngeal candidiasis.  A chemotherapy plan was entered today.  I was present for greater than 50% of today's visit.  I performed Medical Decision Making.  Mancel Bale, MD

## 2023-09-17 LAB — CANCER ANTIGEN 19-9: CA 19-9: 7265 U/mL — ABNORMAL HIGH (ref 0–35)

## 2023-09-18 ENCOUNTER — Other Ambulatory Visit: Payer: Self-pay | Admitting: Oncology

## 2023-09-19 NOTE — Progress Notes (Signed)
Pharmacist Chemotherapy Monitoring - Initial Assessment    Anticipated start date: 09/26/23   The following has been reviewed per standard work regarding the patient's treatment regimen: The patient's diagnosis, treatment plan and drug doses, and organ/hematologic function Lab orders and baseline tests specific to treatment regimen  The treatment plan start date, drug sequencing, and pre-medications Prior authorization status  Patient's documented medication list, including drug-drug interaction screen and prescriptions for anti-emetics and supportive care specific to the treatment regimen The drug concentrations, fluid compatibility, administration routes, and timing of the medications to be used The patient's access for treatment and lifetime cumulative dose history, if applicable  The patient's medication allergies and previous infusion related reactions, if applicable   Changes made to treatment plan:  N/A  Follow up needed:  N/A   Daylene Katayama, RPH, 09/19/2023  11:51 AM

## 2023-09-20 ENCOUNTER — Other Ambulatory Visit: Payer: Self-pay

## 2023-09-20 ENCOUNTER — Ambulatory Visit (HOSPITAL_BASED_OUTPATIENT_CLINIC_OR_DEPARTMENT_OTHER)
Admission: RE | Admit: 2023-09-20 | Discharge: 2023-09-20 | Disposition: A | Discharge status: Home / Self Care | Payer: 59 | Source: Ambulatory Visit | Attending: Nurse Practitioner | Admitting: Nurse Practitioner

## 2023-09-20 ENCOUNTER — Encounter (HOSPITAL_BASED_OUTPATIENT_CLINIC_OR_DEPARTMENT_OTHER): Payer: Self-pay

## 2023-09-20 DIAGNOSIS — C250 Malignant neoplasm of head of pancreas: Secondary | ICD-10-CM | POA: Insufficient documentation

## 2023-09-20 MED ORDER — IOHEXOL 300 MG/ML  SOLN
100.0000 mL | Freq: Once | INTRAMUSCULAR | Status: AC | PRN
  Administered 2023-09-20: 100 mL via INTRAVENOUS

## 2023-09-23 ENCOUNTER — Other Ambulatory Visit: Payer: 59

## 2023-09-23 ENCOUNTER — Ambulatory Visit (HOSPITAL_BASED_OUTPATIENT_CLINIC_OR_DEPARTMENT_OTHER): Payer: 59 | Admitting: Orthopaedic Surgery

## 2023-09-23 ENCOUNTER — Ambulatory Visit (HOSPITAL_BASED_OUTPATIENT_CLINIC_OR_DEPARTMENT_OTHER): Payer: 59

## 2023-09-23 DIAGNOSIS — M25562 Pain in left knee: Secondary | ICD-10-CM

## 2023-09-23 DIAGNOSIS — G8929 Other chronic pain: Secondary | ICD-10-CM

## 2023-09-23 NOTE — Progress Notes (Signed)
Chief Complaint: Left knee pain     History of Present Illness:    Wendy Hoffman is a 57 y.o. female presents with left knee pain which has been ongoing for the past 30 years.  She has had multiple injections including steroid as well as hyaluronic acid.  She has been participating in physical therapy years ago with limited relief.  She states that the knee is constantly buckling and giving out.  She is currently being treated by Dr. Truett Perna for pancreatic cancer.    Surgical History:   None  PMH/PSH/Family History/Social History/Meds/Allergies:    Past Medical History:  Diagnosis Date   Anemia    Anxiety    ARF (acute renal failure) (HCC) 06/18/2021   Arthritis    Asthma    Cervical ca (HCC)    H/O: hysterectomy    High cholesterol    Hypertension    Hypothyroidism    Insomnia    Medical history non-contributory    Migraine    Morbid obesity (HCC)    Pancreatic cancer (HCC) 02/2021   Shortness of breath    Stroke [facility])    Thyroid disease    Past Surgical History:  Procedure Laterality Date   ABDOMINAL HYSTERECTOMY     BIOPSY  02/19/2021   Procedure: BIOPSY;  Surgeon: Napoleon Form, MD;  Location: [facility];  Service: Endoscopy;;   BIOPSY  06/19/2021   Procedure: BIOPSY;  Surgeon: Kathi Der, MD;  Location: [facility];  Service: Gastroenterology;;   BIOPSY  08/04/2023   Procedure: BIOPSY;  Surgeon: Lemar Lofty., MD;  Location: [facility];  Service: Gastroenterology;;   CESAREAN SECTION     3 occasions   CHOLECYSTECTOMY     DUODENAL STENT PLACEMENT N/A 08/04/2023   Procedure: DUODENAL STENT PLACEMENT;  Surgeon: Lemar Lofty., MD;  Location: [facility];  Service: Gastroenterology;  Laterality: N/A;   ENTEROSCOPY N/A 08/04/2023   Procedure: ENTEROSCOPY;  Surgeon: Meridee Score Netty Starring., MD;  Location: [facility];  Service: Gastroenterology;  Laterality: N/A;   ESOPHAGEAL BRUSHING  08/04/2023    Procedure: ESOPHAGEAL BRUSHING;  Surgeon: Lemar Lofty., MD;  Location: [facility];  Service: Gastroenterology;;   ESOPHAGOGASTRODUODENOSCOPY (EGD) WITH PROPOFOL N/A 02/19/2021   Procedure: ESOPHAGOGASTRODUODENOSCOPY (EGD) WITH PROPOFOL;  Surgeon: Napoleon Form, MD;  Location: [facility];  Service: Endoscopy;  Laterality: N/A;   ESOPHAGOGASTRODUODENOSCOPY (EGD) WITH PROPOFOL N/A 06/19/2021   Procedure: ESOPHAGOGASTRODUODENOSCOPY (EGD) WITH PROPOFOL;  Surgeon: Kathi Der, MD;  Location: [facility];  Service: Gastroenterology;  Laterality: N/A;   FLEXIBLE SIGMOIDOSCOPY N/A 02/19/2021   Procedure: FLEXIBLE SIGMOIDOSCOPY;  Surgeon: Napoleon Form, MD;  Location: [facility];  Service: Endoscopy;  Laterality: N/A;   IR GASTROSTOMY TUBE MOD SED  07/01/2021   IR GASTROSTOMY TUBE REMOVAL  08/21/2021   IR IMAGING GUIDED PORT INSERTION  02/24/2021   IR US GUIDE BX ASP/DRAIN  02/24/2021   THYROIDECTOMY, PARTIAL     Goiter   Social History   Socioeconomic History   Marital status: Single    Spouse name: Not on file   Number of children: Not on file   Years of education: Not on file   Highest education level: Some college, no degree  Occupational History   Not on file  Tobacco Use   Smoking status: Former  Types: Cigarettes   Smokeless tobacco: Never  Vaping Use   Vaping status: Never Used  Substance and Sexual Activity   Alcohol use: No   Drug use: No   Sexual activity: Not on file  Other Topics Concern   Not on file  Social History Narrative   Not on file   Social Determinants of Health   Financial Resource Strain: Medium Risk (09/01/2023)   Overall Financial Resource Strain (CARDIA)    Difficulty of Paying Living Expenses: Somewhat hard  Food Insecurity: No Food Insecurity (09/01/2023)   Hunger Vital Sign    Worried About Running Out of Food in the Last Year: Never true    Ran Out of Food in the Last Year: Never true  Transportation Needs: Unmet  Transportation Needs (09/01/2023)   PRAPARE - Transportation    Lack of Transportation (Medical): Yes    Lack of Transportation (Non-Medical): Yes  Physical Activity: Unknown (09/01/2023)   Exercise Vital Sign    Days of Exercise per Week: 0 days    Minutes of Exercise per Session: Not on file  Stress: Stress Concern Present (09/01/2023)   [employer] of Occupational Health - Occupational Stress Questionnaire    Feeling of Stress : Very much  Social Connections: Socially Isolated (09/01/2023)   Social Connection and Isolation Panel [NHANES]    Frequency of Communication with Friends and Family: More than three times a week    Frequency of Social Gatherings with Friends and Family: Never    Attends Religious Services: Never    Database administrator or Organizations: No    Attends Engineer, structural: Never    Marital Status: Divorced   Family History  Problem Relation Age of Onset   Migraines Sister    Non-Hodgkin's lymphoma Other        maternal half brother's grandson   Non-Hodgkin's lymphoma Half-Brother        maternal half brother; dx 30s or 71s   Pancreatic cancer Other 51       MGF's niece   Allergies  Allergen Reactions   Blueberry [Vaccinium Angustifolium] Anaphylaxis   Mango Flavor Anaphylaxis   Oxaliplatin Shortness Of Breath and Itching    Patient has reacted to oxaliplatin with minor symptoms of neuropathy and itching.  01/24/23: See Infusion progress note.   Patient had hypersensitivity reaction to Oxaliplatin. See progress note from 04/05/2023 @ 1400. Patient did not complete infusion.   Reglan [Metoclopramide] Nausea And Vomiting   Aspirin Rash   Penicillins Hives    Has patient had a PCN reaction causing immediate rash, facial/tongue/throat swelling, SOB or lightheadedness with hypotension: Yes Has patient had a PCN reaction causing severe rash involving mucus membranes or skin necrosis: No Has patient had a PCN reaction that required  hospitalization No Has patient had a PCN reaction occurring within the last 10 years: No If all of the above answers are "NO", then may proceed with Cephalosporin use.    Current Outpatient Medications  Medication Sig Dispense Refill   albuterol (VENTOLIN HFA) 108 (90 Base) MCG/ACT inhaler INHALE 1 TO 2 PUFFS BY MOUTH EVERY 6 HOURS AS NEEDED FOR WHEEZING AND FOR SHORTNESS OF BREATH 54 g 0   bisacodyl (DULCOLAX) 5 MG EC tablet Take 2 tablets (10 mg total) by mouth daily as needed for moderate constipation. 60 tablet 2   DULoxetine (CYMBALTA) 30 MG capsule TAKE 2 CAPSULES BY MOUTH DAILY 180 capsule 0   fluconazole (DIFLUCAN) 100 MG tablet Take 1 tablet (  100 mg total) by mouth daily. 7 tablet 0   furosemide (LASIX) 20 MG tablet Take 1 tablet (20 mg total) by mouth 2 (two) times daily as needed for edema. (Patient not taking: Reported on 08/17/2023) 60 tablet 1   gabapentin (NEURONTIN) 300 MG capsule Take 1 capsule (300 mg total) by mouth at bedtime. 90 capsule 0   hydrocortisone-pramoxine (PROCTOFOAM HC) rectal foam Place 1 applicator rectally 3 (three) times daily. 10 g 0   lidocaine-prilocaine (EMLA) cream Apply 1 tablespoon to port site 2 hours prior to stick to numb site. 30 g 2   magic mouthwash (nystatin, diphenhydrAMINE, alum & mag hydroxide) suspension mixture Swish 5 mL for 1 minute and spit four times daily as needed for mouth pain. 240 mL 1   morphine (MS CONTIN) 60 MG 12 hr tablet Take 1 tablet (60 mg total) by mouth every 12 (twelve) hours. 60 tablet 0   Morphine Sulfate (MORPHINE CONCENTRATE) 10 MG/0.5ML SOLN concentrated solution Place 1-1.5 mLs (20-30 mg total) under the tongue every 8 (eight) hours as needed for severe pain (pain not relieved with oxycodone). 30 mL 0   ondansetron (ZOFRAN) 8 MG tablet Take 1 tablet (8 mg total) by mouth every 8 (eight) hours as needed for nausea or vomiting. 90 tablet 0   ondansetron (ZOFRAN-ODT) 8 MG disintegrating tablet Take 1 tablet (8 mg total) by  mouth every 8 (eight) hours as needed for nausea or vomiting. 90 tablet 0   Oxycodone HCl 10 MG TABS Take 1-2 tablets (10-20 mg total) by mouth every 4 (four) hours as needed (Pain). 60 tablet 0   pantoprazole (PROTONIX) 40 MG tablet TAKE 1 TABLET(40 MG) BY MOUTH TWICE DAILY 180 tablet 0   potassium chloride (KLOR-CON M) 10 MEQ tablet TAKE 1 TABLET BY MOUTH DAILY AS DIRECTED WHEN USING FUROSEMIDE 90 tablet 0   predniSONE (DELTASONE) 10 MG tablet Take 1 tablet (10 mg total) by mouth daily with breakfast. 30 tablet 0   prochlorperazine (COMPAZINE) 10 MG tablet TAKE 1 TABLET(10 MG) BY MOUTH EVERY 6 HOURS AS NEEDED FOR NAUSEA OR VOMITING 120 tablet 0   scopolamine (TRANSDERM-SCOP) 1 MG/3DAYS Place 1 patch (1.5 mg total) onto the skin every 3 (three) days. 30 patch 0   SYMBICORT 160-4.5 MCG/ACT inhaler INHALE 2 PUFFS INTO THE LUNGS TWICE DAILY 10.2 g 1   zolpidem (AMBIEN) 10 MG tablet Take 1 tablet (10 mg total) by mouth at bedtime as needed for sleep. 30 tablet 0   No current facility-administered medications for this visit.   No results found.  Review of Systems:   A ROS was performed including pertinent positives and negatives as documented in the HPI.  Physical Exam :   Constitutional: NAD and appears stated age Neurological: Alert and oriented Psych: Appropriate affect and cooperative There were no vitals taken for this visit.   Comprehensive Musculoskeletal Exam:    Left knee with tricompartmental tenderness palpation.  There is no effusion.  Range of motion is from 0 to 120 degrees with crepitus.  Distal neurosensory exam is intact with minor valgus deformity  Imaging:   Xray (4 views left knee): Advanced tricompartmental osteoarthritis   I personally reviewed and interpreted the radiographs.   Assessment:   57 y.o. female with left knee advanced tricompartmental osteoarthritis.  At this time she has trialed multiple treatments including physical therapy multiple steroid  injections and hyaluronic acid.  None of these have given her prolonged relief.  Given this I would like  to plan to referral to my partner Dr. Glee Arvin for discussion of possible knee arthroplasty.  Will plan to see her back as needed  Plan :    -Return to clinic as needed     I personally saw and evaluated the patient, and participated in the management and treatment plan.  Huel Cote, MD Attending Physician, Orthopedic Surgery  This document was dictated using Dragon voice recognition software. A reasonable attempt at proof reading has been made to minimize errors.

## 2023-09-25 ENCOUNTER — Other Ambulatory Visit: Payer: Self-pay

## 2023-09-26 ENCOUNTER — Inpatient Hospital Stay (HOSPITAL_BASED_OUTPATIENT_CLINIC_OR_DEPARTMENT_OTHER): Payer: 59 | Admitting: Nurse Practitioner

## 2023-09-26 ENCOUNTER — Inpatient Hospital Stay: Payer: 59

## 2023-09-26 ENCOUNTER — Other Ambulatory Visit (HOSPITAL_BASED_OUTPATIENT_CLINIC_OR_DEPARTMENT_OTHER): Payer: Self-pay

## 2023-09-26 ENCOUNTER — Inpatient Hospital Stay: Payer: 59 | Admitting: Oncology

## 2023-09-26 ENCOUNTER — Inpatient Hospital Stay: Payer: 59 | Attending: Nurse Practitioner

## 2023-09-26 ENCOUNTER — Encounter: Payer: Self-pay | Admitting: Nurse Practitioner

## 2023-09-26 VITALS — BP 115/89 | HR 101 | Temp 98.1°F | Resp 18 | Ht 64.0 in | Wt 170.0 lb

## 2023-09-26 DIAGNOSIS — C250 Malignant neoplasm of head of pancreas: Secondary | ICD-10-CM

## 2023-09-26 DIAGNOSIS — C259 Malignant neoplasm of pancreas, unspecified: Secondary | ICD-10-CM | POA: Diagnosis not present

## 2023-09-26 DIAGNOSIS — Z5111 Encounter for antineoplastic chemotherapy: Secondary | ICD-10-CM | POA: Diagnosis not present

## 2023-09-26 LAB — CMP (CANCER CENTER ONLY)
ALT: 7 U/L (ref 0–44)
AST: 21 U/L (ref 15–41)
Albumin: 3.1 g/dL — ABNORMAL LOW (ref 3.5–5.0)
Alkaline Phosphatase: 146 U/L — ABNORMAL HIGH (ref 38–126)
Anion gap: 8 (ref 5–15)
BUN: 30 mg/dL — ABNORMAL HIGH (ref 6–20)
CO2: 31 mmol/L (ref 22–32)
Calcium: 9.7 mg/dL (ref 8.9–10.3)
Chloride: 94 mmol/L — ABNORMAL LOW (ref 98–111)
Creatinine: 1.62 mg/dL — ABNORMAL HIGH (ref 0.44–1.00)
GFR, Estimated: 37 mL/min — ABNORMAL LOW (ref >60)
Glucose, Bld: 105 mg/dL — ABNORMAL HIGH (ref 70–99)
Potassium: 4.6 mmol/L (ref 3.5–5.1)
Sodium: 133 mmol/L — ABNORMAL LOW (ref 135–145)
Total Bilirubin: 0.8 mg/dL (ref 0.3–1.2)
Total Protein: 7.2 g/dL (ref 6.5–8.1)

## 2023-09-26 LAB — CBC WITH DIFFERENTIAL (CANCER CENTER ONLY)
Abs Immature Granulocytes: 0.04 10*3/uL (ref 0.00–0.07)
Basophils Absolute: 0 10*3/uL (ref 0.0–0.1)
Basophils Relative: 0 %
Eosinophils Absolute: 0.1 10*3/uL (ref 0.0–0.5)
Eosinophils Relative: 1 %
HCT: 27.5 % — ABNORMAL LOW (ref 36.0–46.0)
Hemoglobin: 8.6 g/dL — ABNORMAL LOW (ref 12.0–15.0)
Immature Granulocytes: 1 %
Lymphocytes Relative: 12 %
Lymphs Abs: 1.1 10*3/uL (ref 0.7–4.0)
MCH: 25.7 pg — ABNORMAL LOW (ref 26.0–34.0)
MCHC: 31.3 g/dL (ref 30.0–36.0)
MCV: 82.3 fL (ref 80.0–100.0)
Monocytes Absolute: 0.8 10*3/uL (ref 0.1–1.0)
Monocytes Relative: 9 %
Neutro Abs: 6.7 10*3/uL (ref 1.7–7.7)
Neutrophils Relative %: 77 %
Platelet Count: 438 10*3/uL — ABNORMAL HIGH (ref 150–400)
RBC: 3.34 MIL/uL — ABNORMAL LOW (ref 3.87–5.11)
RDW: 18.8 % — ABNORMAL HIGH (ref 11.5–15.5)
WBC Count: 8.6 10*3/uL (ref 4.0–10.5)
nRBC: 0 % (ref 0.0–0.2)

## 2023-09-26 LAB — MAGNESIUM: Magnesium: 2.4 mg/dL (ref 1.7–2.4)

## 2023-09-26 IMAGING — CT CT ABD-PELV W/ CM
2 of 5 series · 16 of 46 positions shown, 18 images · IV contrast (APPLIED)
Comparison: None.

CLINICAL DATA: Restaging pancreatic cancer

EXAM:
CT ABDOMEN AND PELVIS WITH CONTRAST
TECHNIQUE: Multidetector CT imaging of the abdomen and pelvis was performed
using the standard protocol following bolus administration of
intravenous contrast.

[Series 2: abd pel w · axial · 0.77mm/px · z∈[-342,+74]mm · 13 of 93 slices shown, 15 images]
[im 5/93  soft-tissue]
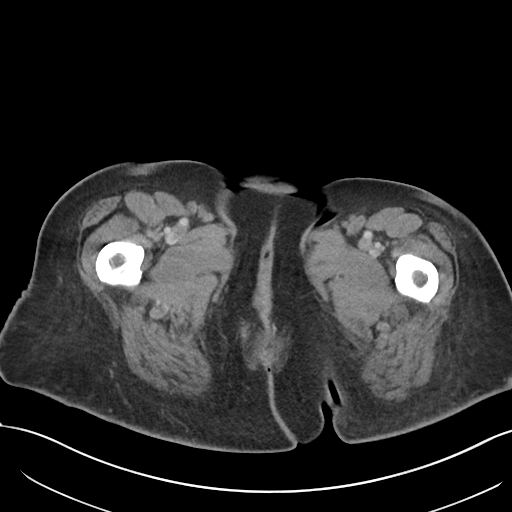
[im 5/93  bone]
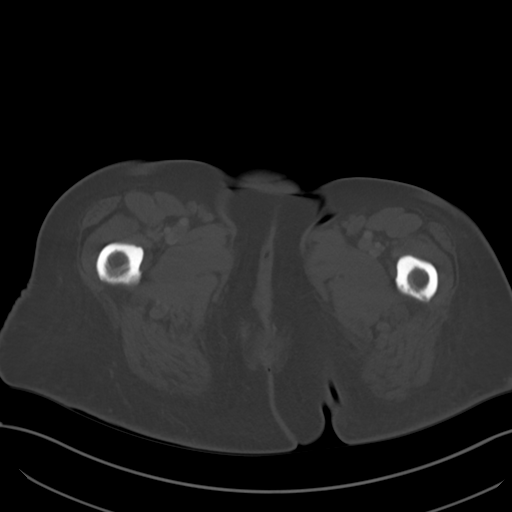
[im 15/93  soft-tissue]
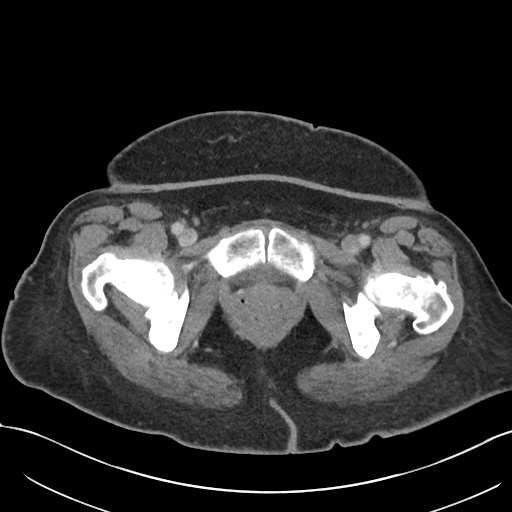
[im 20/93  soft-tissue]
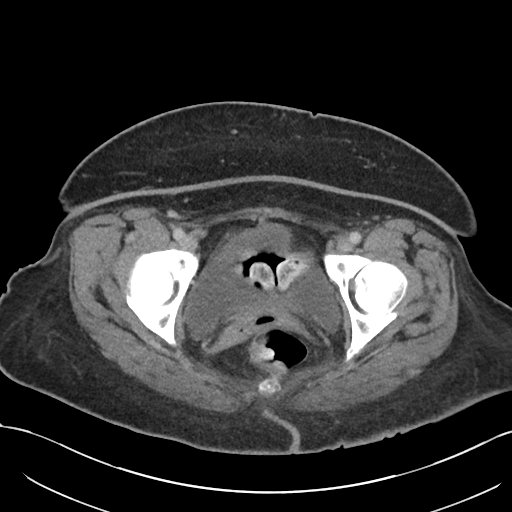
[im 25/93  soft-tissue]
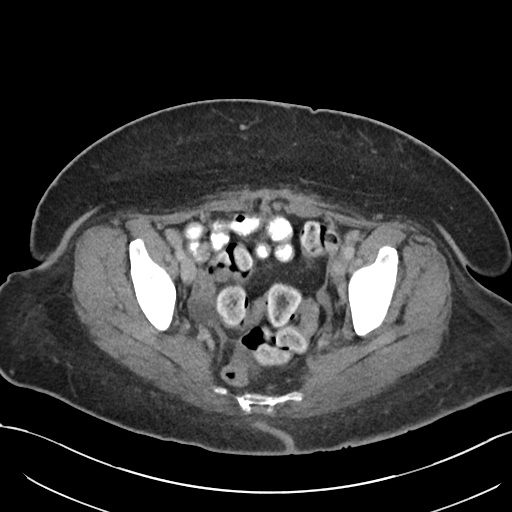
[im 34/93  soft-tissue]
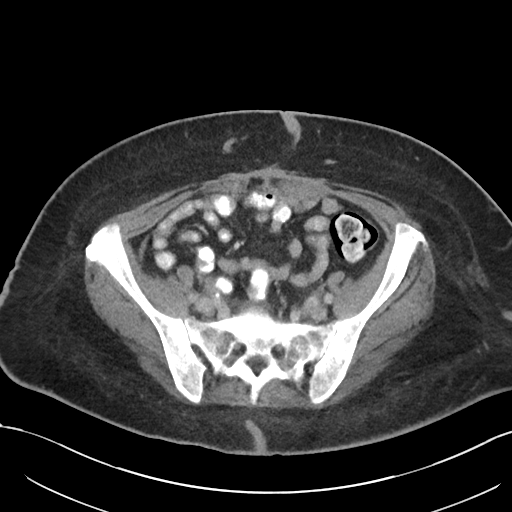
[im 39/93  soft-tissue]
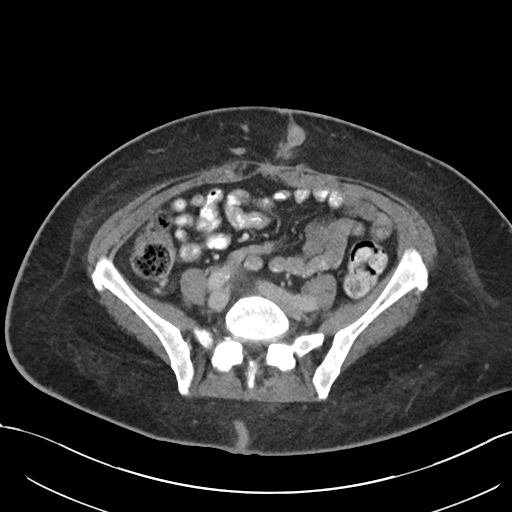
[im 49/93  soft-tissue]
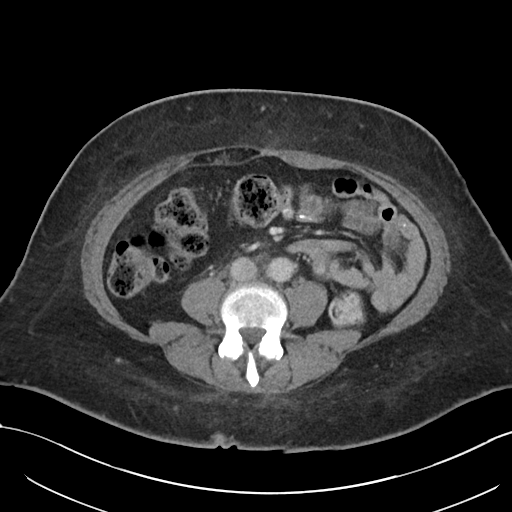
[im 54/93  soft-tissue]
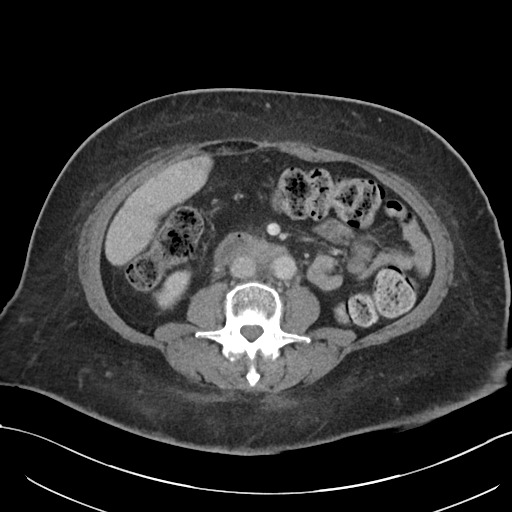
[im 59/93  soft-tissue]
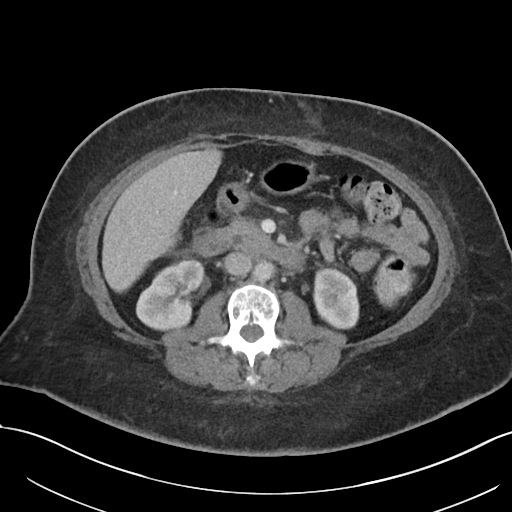
[im 59/93  bone]
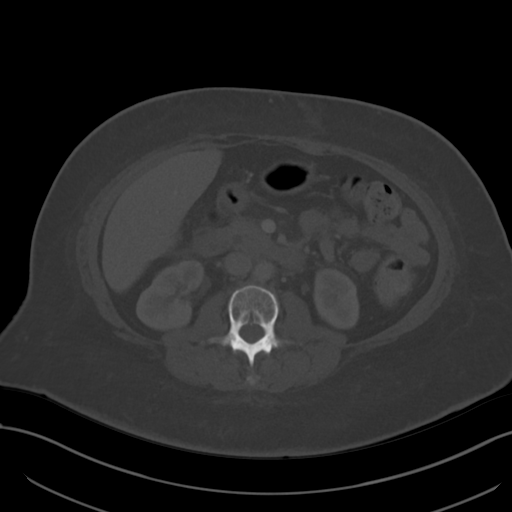
[im 68/93  soft-tissue]
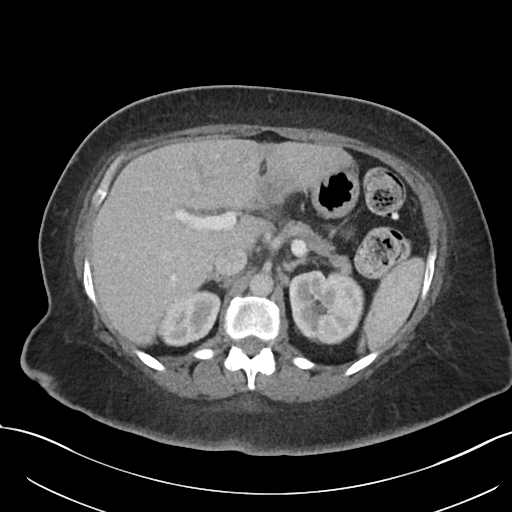
[im 73/93  soft-tissue]
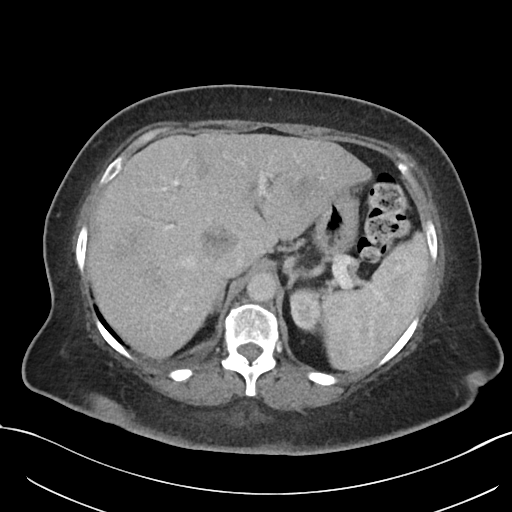
[im 78/93  soft-tissue]
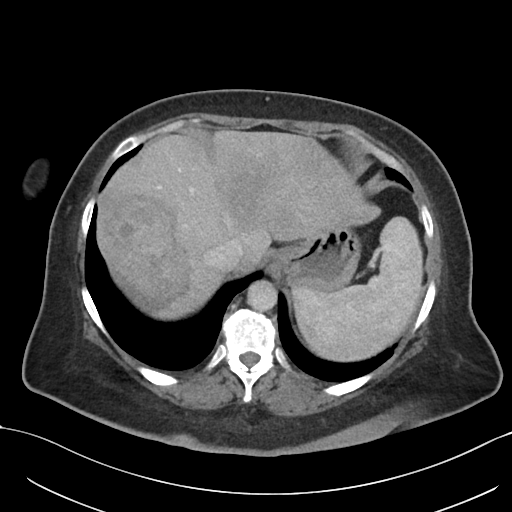
[im 88/93  soft-tissue]
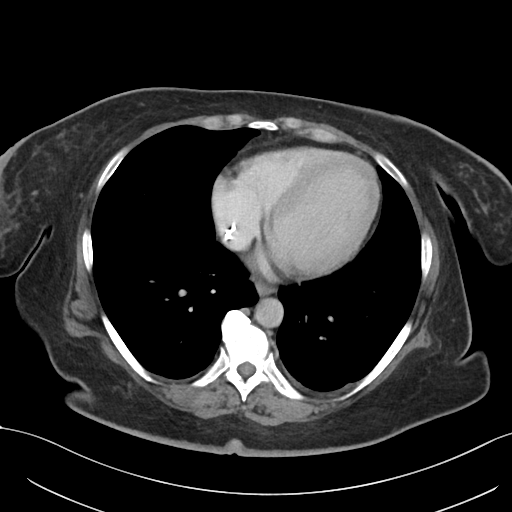

[Series 5: coronal · coronal · 0.88mm/px · 3 of 96 slices shown]
[im 32/96  soft-tissue]
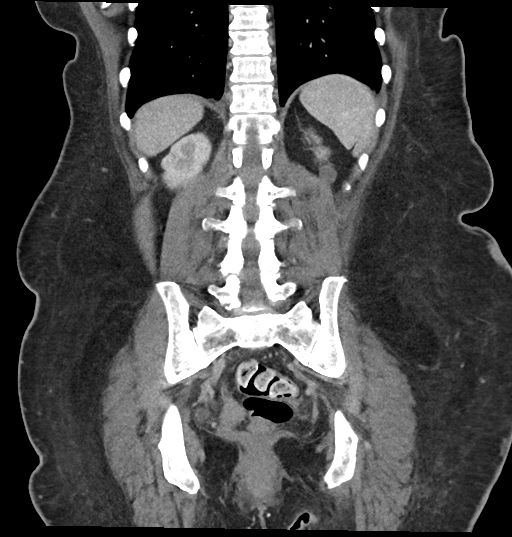
[im 43/96  soft-tissue]
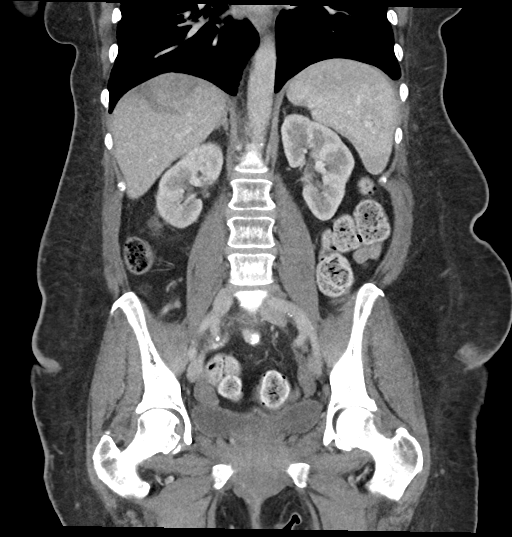
[im 53/96  soft-tissue]
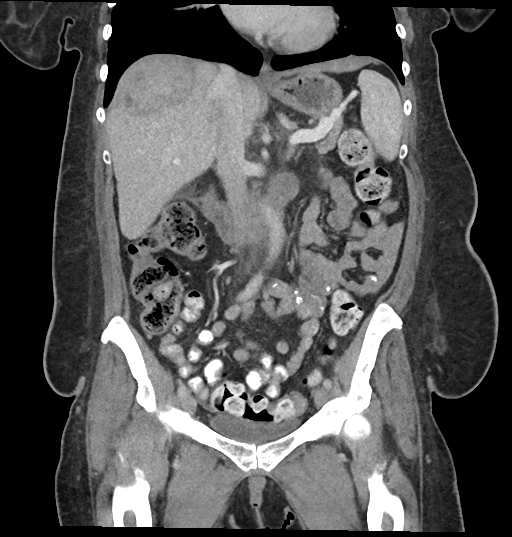

[16 of 46 positions shown; findings below may reference images not displayed]

RADIATION DOSE REDUCTION: This exam was performed according to the
departmental dose-optimization program which includes automated
exposure control, adjustment of the mA and/or kV according to
patient size and/or use of iterative reconstruction technique.

CONTRAST:  75mL OMNIPAQUE IOHEXOL 300 MG/ML  SOLN
FINDINGS: Lower chest: No suspicious pulmonary nodularity.

Hepatobiliary: Again demonstrated multifocal hepatic metastasis. No
appreciable visual change number size of lesions.

Dominant lesion the RIGHT hepatic lobe measuring 9.6 x 5.9 cm
compares to 8.0 x 6.0 cm.

Lesion in the central liver adjacent to the caudate measures 2.4 x
2.7 cm compared to 3.0 x 2.6 cm.

Small subcapsular lesion in the inferior RIGHT hepatic lobe measures
2.3 cm (image 32/2) compared to 1.7 cm.

No new lesions identified

No periportal adenopathy

Pancreas: Pancreatic head mass lesion decreased in volume measuring
approximately 3.1 x 2.3 cm (image 37/series 2) compared to 3.7 x
cm. There is atrophy of the body and tail the pancreas. No duct
dilatation. Common bile duct is mildly dilated similar prior

Spleen: Normal spleen

Adrenals/urinary tract: Adrenal glands and kidneys are normal. The
ureters and bladder normal.

Stomach/Bowel: Stomach, small bowel, appendix, and cecum are normal.
The colon and rectosigmoid colon are normal.

Vascular/Lymphatic: Abdominal aorta is normal caliber. No periportal
or retroperitoneal adenopathy. No pelvic adenopathy.

Reproductive: Normal mediastinum and cardiac silhouette. Lungs are
hyperinflated. Normal pulmonary vasculature. No evidence of
effusion, infiltrate, or pneumothorax. No acute bony abnormality.

Other: no peritoneal metastasis.  No omental metastasis

Musculoskeletal: Sclerotic lesion beneath the superior endplate of
the L3 vertebral body measures 9 mm (69/6) not changed prior.
IMPRESSION: 1. Interval decrease in size of pancreatic head mass.
2. Multifocal hepatic metastasis not significant changed in size or
number compared to prior.
3. No metastatic lymphadenopathy.
4. No omental metastasis or peritoneal metastasis
5. No pulmonary metastasis.
6. Stable round sclerotic lesion in the L3 vertebral body

## 2023-09-26 MED ORDER — HEPARIN SOD (PORK) LOCK FLUSH 100 UNIT/ML IV SOLN: 500.0000 [IU] | Freq: Once | INTRAVENOUS | Status: DC | PRN

## 2023-09-26 MED ORDER — SODIUM CHLORIDE 0.9% FLUSH: 10.0000 mL | INTRAVENOUS | Status: DC | PRN

## 2023-09-26 MED ORDER — SODIUM CHLORIDE 0.9 % IV SOLN: Freq: Once | INTRAVENOUS | Status: AC

## 2023-09-26 MED ORDER — OXYCODONE HCL 10 MG PO TABS
10.0000 mg | ORAL_TABLET | ORAL | 0 refills | Status: DC | PRN
  Filled 2023-09-26: qty 60, 5d supply, fill #0

## 2023-09-26 MED ORDER — FLUCONAZOLE 100 MG PO TABS
100.0000 mg | ORAL_TABLET | Freq: Every day | ORAL | 0 refills | Status: DC
  Filled 2023-09-26: qty 7, 7d supply, fill #0

## 2023-09-26 MED ORDER — POTASSIUM CHLORIDE IN NACL 20-0.9 MEQ/L-% IV SOLN
Freq: Once | INTRAVENOUS | Status: AC
  Filled 2023-09-26: qty 1000

## 2023-09-26 NOTE — Patient Instructions (Addendum)

## 2023-09-26 NOTE — Progress Notes (Signed)
Per Lonna Cobb, NP ok to start prehydration fluids with pending CMP.  CMP back, Scr 1.62.  Lonna Cobb, NP made aware.  Per Lonna Cobb, NP hold treatment today.  Verbal order received for 1 L NS over 2 hours.  Patient instructed to continue to push oral hydration. Patient informed of change in plan and verbalized understanding.

## 2023-09-26 NOTE — Patient Instructions (Signed)

## 2023-09-26 NOTE — Progress Notes (Addendum)
[facility] OFFICE PROGRESS NOTE   Diagnosis: Pancreas cancer  INTERVAL HISTORY:   Wendy Hoffman returns as scheduled.  She is seen prior to beginning cycle 1 gemcitabine/cisplatin.  Continued intermittent nausea.  She is tolerating fluids without difficulty.  She estimates drinking 4 or 5 Ensure nutritional supplements a day.  She still has difficulty with solids.  Bowels are moving.  Mouth feels better since completing the course of Diflucan.  No change in abdominal pain.  She reports needing a refill on oxycodone.  Objective:  Vital signs in last 24 hours:  Temperature 98.1, heart rate 103, respirations 18, blood pressure 106/81, weight 170 pounds    HEENT: Patchy white coating over tongue.  No buccal thrush. Resp: Lungs clear bilaterally. Cardio: Regular rate and rhythm. GI: Abdomen is soft, tender mainly across the upper abdomen.  No hepatomegaly. Vascular: No leg edema. Neuro: Alert and oriented. Skin: Skin turgor is intact. Port-A-Cath without erythema.  Lab Results:  Lab Results  Component Value Date   WBC 8.6 09/26/2023   HGB 8.6 (L) 09/26/2023   HCT 27.5 (L) 09/26/2023   MCV 82.3 09/26/2023   PLT 438 (H) 09/26/2023   NEUTROABS 6.7 09/26/2023    Imaging:  No results found.  Medications: I have reviewed the patient's current medications.  Assessment/Plan: Pancreas cancer -CT abdomen/pelvis without contrast 02/16/2021-multiple large hypoechoic masses in the patient's liver consistent metastatic disease, ill-defined masslike area in the pancreatic head measuring approximate 4.7 cm, possible filling defect in the right ventricle. -EGD 02/19/2021-large infiltrative mass with bleeding in the second portion of the duodenum, appears to be arising near the ampulla, partially obstructive with friable mucosa.  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 -Flexible sigmoidoscopy 02/19/2021-diverticulosis in the sigmoid colon,  nonbleeding external and internal hemorrhoids -Cardiac CT 02/20/2021-mass in the mid RV attached to the interventricular septum measuring 16 mm x 6 mm and concerning for metastatic tumor -MRI abdomen with/without contrast 02/21/2021-mass in the posterior pancreatic head measuring 4.3 x 3.8 cm with obstruction of the central pancreatic duct and diffuse dilatation up to 6 mm consistent with pancreatic adenocarcinoma, liver lesions the largest mass of the central left lobe measuring 9.9 x 9.2 cm consistent with hepatic metastatic disease,  hepatomegaly. -Ultrasound-guided biopsy of a left liver lesion 02/24/2021-adenocarcinoma, morphologically similar to the duodenal biopsy; microsatellite stable, tumor mutation burden 1 -Negative genetic testing -Palliative radiation to the duodenal mass 02/25/2021-03/06/2021 -Cycle 1 FOLFOX 03/17/2021 -Cycle 2 FOLFOX 04/01/2021 -Cycle 3 FOLFIRINOX 04/14/2021 -Cycle 4 FOLFIRINOX 04/27/2021, Udenyca added -Cycle 5 FOLFIRINOX 05/12/2021, Udenyca -MRI abdomen 05/23/2021-decrease in size of pancreas mass and hepatic lesions, no progressive disease -Cycle 6 FOLFIRINOX 05/27/2021 -CT 06/17/2021-multiple liver metastases similar to her prior exam.  Pancreas mass not significantly changed. -CT 06/26/2021-stable pancreas mass, no significant change in size and number of known liver metastases. -CT abdomen/pelvis 07/12/2021-new gastrostomy tube.  Multiple liver masses slightly decreased in size from prior exam.  Pancreas head mass unchanged. -Cycle 1 gemcitabine/Abraxane 07/30/2021 -Cycle 2 gemcitabine/Abraxane 08/13/2021 -Cycle 3 gemcitabine 08/27/2021, Abraxane held due to significant bilateral toe pain, question neuropathy -Cycle 4 gemcitabine, Abraxane held due to neuropathy, 09/10/2021 -Cycle 5 gemcitabine, Abraxane held due to neuropathy, 09/24/2021 -Cycle 6 gemcitabine, Abraxane held due to neuropathy 10/16/2021 -CTs 10/29/2021-mild progression of multifocal liver metastases; mild increase  in size of hypoenhancing mass within the head of the pancreas; new mild pleural nodularity along the posterior lower lobes -Cycle 7 gemcitabine/Abraxane 11/03/2021 -Cycle 8 gemcitabine/Abraxane 11/23/2021 -Cycle 9 gemcitabine/Abraxane 12/10/2021 -Cycle 10  Gemcitabine/Abraxane 12/24/2021 -CT abdomen/pelvis 01/04/2022-decrease size of pancreas head mass, no change in multifocal hepatic metastases -Cycle 11 gemcitabine/Abraxane 01/07/2022 -Cycle 12 gemcitabine/Abraxane 02/04/2022 -Cycle 13 Gemcitabine/Abraxane 02/18/2022 -Cycle 14 gemcitabine/Abraxane 03/11/2022 -Cycle 15 gemcitabine/Abraxane 03/29/2022 -Cycle 16 gemcitabine/Abraxane 04/16/2022 -CT abdomen/pelvis 04/22/2022-mixed response in the liver, some lesions larger, some smaller, no change in pancreas head mass, no evidence of metastatic disease elsewhere in the abdomen or pelvis -Cycle 17 gemcitabine/Abraxane 05/17/2022 -Cycle 18 Gemcitabine/Abraxane 06/01/2022 -Cycle 19 gemcitabine/Abraxane 06/18/2022 -Cycle 20 gemcitabine/Abraxane 07/05/2022 -Cycle 21 gemcitabine/Abraxane 08/09/2022 -Cycle 22 gemcitabine/Abraxane 08/23/2022 -CT abdomen/pelvis 09/01/2022-stable liver metastases, difficult to visualize the pancreas head mass, no evidence of disease progression -Cycle 23 gemcitabine/Abraxane 09/06/2022 -Cycle 24 gemcitabine/Abraxane 09/20/2022 -Cycle 25 gemcitabine/Abraxane 10/18/2022 -Cycle 26 gemcitabine/Abraxane 11/01/2022 -Cycle 27 gemcitabine/Abraxane 11/29/2022 -CT abdomen/pelvis 12/14/2022-increased size of liver metastases, less well-defined pancreas mass, 4 mm right lung nodule -Cycle 1 FOLFIRINOX 01/10/2023 -Cycle 2 FOLFIRINOX 01/24/2023, 5-FU dose reduced secondary to mucositis, chemotherapy plan dose reduce for weight loss -Develop pruritus during the oxaliplatin infusion, initial improvement with Benadryl and Solu-Medrol, oxaliplatin was resumed and the pruritus recurred.  Oxaliplatin was discontinued. -Chemotherapy held 02/07/2023 due to diarrhea,  nausea/vomiting, dehydration -Cycle 3 FOLFIRINOX 03/07/2023 -Cycle 4 FOLFIRINOX 03/22/2023, 5-FU dose reduction due to mucositis, chemotherapy plan dose reduction for weight loss -CTs 03/31/2023-numerous bulky liver lesions, majority are slightly diminished in size though some lesions are unchanged or slightly enlarged. -Cycle 5 FOLFIRINOX 04/05/2023 -04/05/2023 reaction to oxaliplatin with dyspnea and pruritus -FOLFIRI every 3 weeks beginning 04/27/2023 -FOLFIRI 05/25/2023 -CTs 07/04/2023-widespread hepatic metastatic disease again noted, some of the lesions superiorly in the right lobe may be mildly progressive, direct comparison limited by lack of contrast.  Grossly unchanged pancreatic head mass.  Constipation. -CTs 08/24/2023-no change in multifocal hepatic metastasis.  No change in primary pancreatic head mass.  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  Nodule at the left lung base medially is increased from CT abdomen 07/04/2023 -CTs 09/20/2023-bilateral pulmonary nodule slightly larger, bulky hepatic metastases with interval increase, potential new 13 mm left paraesophageal node, stable appearance of the pancreatic head lesion with diffuse dilatation of the main pancreatic duct.  No hydronephrosis/hydroureter -Cycle 1 gemcitabine/cisplatin 09/26/2023 2.  Iron deficiency anemia-improved -Feraheme 510 mg 02/20/2021 3.  Protein calorie malnutrition 4.  Possible right ventricular filling defect on noncontrast CT scan MRI cardiac morphology 02/21/2021-mass in the mid RV attached to the interventricular septum measures 16 mm x 6 mm.  Mass appears heterogeneous with area of enhancement on LGE imaging.  Mass is concerning for metastatic tumor.  Normal LV size and systolic function.  Normal RV size and systolic function. 5.  Hypertension 6.  History of CVA 7.  Hyperlipidemia 8.  Hypothyroidism 9.  Morbid obesity 10.  Asthma 11.  Migraines 12.  Pain secondary to #1 13.  Port-A-Cath  placement 02/25/2021 14.  Hypokalemia 04/01/2021-started a potassium supplement 15.  Hospital admission 06/17/2021-intractable nausea and vomiting EGD 06/19/2021-diffuse gastritis, nonobstructing duodenal mass, biopsy of stomach-no malignancy or H. pylori, duodenal mass-adenocarcinoma Small bowel endoscopy 08/04/2023-narrowing at the second and third portion of the duodenum, stent placed, biopsy from the duodenum negative for malignancy 16.  Hospital admission 07/12/2021 with nausea/vomiting, abdominal pain, and hypokalemia 17.  Percutaneous gastrostomy tube placement 07/01/2021 18.  07/21/2022 bilateral lower extremity venous Doppler study-negative for DVT. 19.  Oral/esophageal candidiasis August 2024-Diflucan  Disposition: Wendy Hoffman appears unchanged.  She has metastatic pancreas cancer.  She is scheduled to begin treatment today with gemcitabine/cisplatin.  She agrees to proceed.  CBC  reviewed.  Counts adequate for treatment.  Chemistry panel reviewed.  Creatinine is elevated at 1.62.  We are holding today's treatment.  She will receive IV fluids, increase fluid intake at home.  Of note, the recent CTs showed no evidence of hydronephrosis/hydroureter.  She will return at the end of this week or early next week for repeat labs and cycle 1 gemcitabine/cisplatin if lab parameters are met.  Oxycodone prescription sent to pharmacy.  We will adjust follow-up appointments based on timing of cycle 1 gemcitabine/cisplatin.    Lonna Cobb ANP/GNP-BC   09/26/2023  8:55 AM

## 2023-09-27 ENCOUNTER — Other Ambulatory Visit: Payer: Self-pay

## 2023-09-27 LAB — CANCER ANTIGEN 19-9: CA 19-9: 6851 U/mL — ABNORMAL HIGH (ref 0–35)

## 2023-09-28 ENCOUNTER — Other Ambulatory Visit: Payer: Self-pay | Admitting: Nurse Practitioner

## 2023-09-28 ENCOUNTER — Other Ambulatory Visit: Payer: Self-pay

## 2023-09-28 DIAGNOSIS — C250 Malignant neoplasm of head of pancreas: Secondary | ICD-10-CM

## 2023-09-29 ENCOUNTER — Other Ambulatory Visit: Payer: Self-pay | Admitting: Nurse Practitioner

## 2023-09-29 ENCOUNTER — Inpatient Hospital Stay: Payer: 59

## 2023-09-29 VITALS — BP 110/78 | HR 91 | Temp 98.1°F | Resp 18

## 2023-09-29 DIAGNOSIS — C250 Malignant neoplasm of head of pancreas: Secondary | ICD-10-CM

## 2023-09-29 DIAGNOSIS — C259 Malignant neoplasm of pancreas, unspecified: Secondary | ICD-10-CM

## 2023-09-29 DIAGNOSIS — Z5111 Encounter for antineoplastic chemotherapy: Secondary | ICD-10-CM | POA: Diagnosis not present

## 2023-09-29 LAB — CBC WITH DIFFERENTIAL (CANCER CENTER ONLY)
Abs Immature Granulocytes: 0.04 10*3/uL (ref 0.00–0.07)
Basophils Absolute: 0 10*3/uL (ref 0.0–0.1)
Basophils Relative: 0 %
Eosinophils Absolute: 0 10*3/uL (ref 0.0–0.5)
Eosinophils Relative: 1 %
HCT: 26 % — ABNORMAL LOW (ref 36.0–46.0)
Hemoglobin: 8.2 g/dL — ABNORMAL LOW (ref 12.0–15.0)
Immature Granulocytes: 1 %
Lymphocytes Relative: 10 %
Lymphs Abs: 0.6 10*3/uL — ABNORMAL LOW (ref 0.7–4.0)
MCH: 25.6 pg — ABNORMAL LOW (ref 26.0–34.0)
MCHC: 31.5 g/dL (ref 30.0–36.0)
MCV: 81.3 fL (ref 80.0–100.0)
Monocytes Absolute: 0.6 10*3/uL (ref 0.1–1.0)
Monocytes Relative: 9 %
Neutro Abs: 5 10*3/uL (ref 1.7–7.7)
Neutrophils Relative %: 79 %
Platelet Count: 390 10*3/uL (ref 150–400)
RBC: 3.2 MIL/uL — ABNORMAL LOW (ref 3.87–5.11)
RDW: 18.9 % — ABNORMAL HIGH (ref 11.5–15.5)
WBC Count: 6.3 10*3/uL (ref 4.0–10.5)
nRBC: 0 % (ref 0.0–0.2)

## 2023-09-29 LAB — CMP (CANCER CENTER ONLY)
ALT: 7 U/L (ref 0–44)
AST: 19 U/L (ref 15–41)
Albumin: 3 g/dL — ABNORMAL LOW (ref 3.5–5.0)
Alkaline Phosphatase: 156 U/L — ABNORMAL HIGH (ref 38–126)
Anion gap: 7 (ref 5–15)
BUN: 23 mg/dL — ABNORMAL HIGH (ref 6–20)
CO2: 30 mmol/L (ref 22–32)
Calcium: 9.3 mg/dL (ref 8.9–10.3)
Chloride: 96 mmol/L — ABNORMAL LOW (ref 98–111)
Creatinine: 1.22 mg/dL — ABNORMAL HIGH (ref 0.44–1.00)
GFR, Estimated: 52 mL/min — ABNORMAL LOW (ref >60)
Glucose, Bld: 96 mg/dL (ref 70–99)
Potassium: 4.7 mmol/L (ref 3.5–5.1)
Sodium: 133 mmol/L — ABNORMAL LOW (ref 135–145)
Total Bilirubin: 0.7 mg/dL (ref 0.3–1.2)
Total Protein: 6.8 g/dL (ref 6.5–8.1)

## 2023-09-29 LAB — MAGNESIUM: Magnesium: 2.1 mg/dL (ref 1.7–2.4)

## 2023-09-29 MED ORDER — HEPARIN SOD (PORK) LOCK FLUSH 100 UNIT/ML IV SOLN
500.0000 [IU] | Freq: Once | INTRAVENOUS | Status: AC | PRN
  Administered 2023-09-29: 500 [IU]

## 2023-09-29 MED ORDER — PALONOSETRON HCL INJECTION 0.25 MG/5ML
0.2500 mg | Freq: Once | INTRAVENOUS | Status: AC
  Administered 2023-09-29: 0.25 mg via INTRAVENOUS
  Filled 2023-09-29: qty 5

## 2023-09-29 MED ORDER — POTASSIUM CHLORIDE IN NACL 20-0.9 MEQ/L-% IV SOLN
Freq: Once | INTRAVENOUS | Status: AC
  Filled 2023-09-29: qty 1000

## 2023-09-29 MED ORDER — DEXAMETHASONE SODIUM PHOSPHATE 10 MG/ML IJ SOLN
10.0000 mg | Freq: Once | INTRAMUSCULAR | Status: AC
  Administered 2023-09-29: 10 mg via INTRAVENOUS
  Filled 2023-09-29: qty 1

## 2023-09-29 MED ORDER — FAMOTIDINE IN NACL 20-0.9 MG/50ML-% IV SOLN
20.0000 mg | Freq: Once | INTRAVENOUS | Status: AC
  Administered 2023-09-29: 20 mg via INTRAVENOUS
  Filled 2023-09-29: qty 50

## 2023-09-29 MED ORDER — SODIUM CHLORIDE 0.9 % IV SOLN
150.0000 mg | Freq: Once | INTRAVENOUS | Status: AC
  Administered 2023-09-29: 150 mg via INTRAVENOUS
  Filled 2023-09-29: qty 150

## 2023-09-29 MED ORDER — DIPHENHYDRAMINE HCL 50 MG/ML IJ SOLN
25.0000 mg | Freq: Once | INTRAMUSCULAR | Status: AC
  Administered 2023-09-29: 25 mg via INTRAVENOUS
  Filled 2023-09-29: qty 1

## 2023-09-29 MED ORDER — SODIUM CHLORIDE 0.9% FLUSH
10.0000 mL | INTRAVENOUS | Status: DC | PRN
  Administered 2023-09-29: 10 mL

## 2023-09-29 MED ORDER — SODIUM CHLORIDE 0.9 % IV SOLN
25.0000 mg/m2 | Freq: Once | INTRAVENOUS | Status: AC
  Administered 2023-09-29: 50 mg via INTRAVENOUS
  Filled 2023-09-29: qty 40

## 2023-09-29 MED ORDER — MAGNESIUM SULFATE 2 GM/50ML IV SOLN
2.0000 g | Freq: Once | INTRAVENOUS | Status: AC
  Administered 2023-09-29: 2 g via INTRAVENOUS
  Filled 2023-09-29: qty 50

## 2023-09-29 MED ORDER — SODIUM CHLORIDE 0.9 % IV SOLN: Freq: Once | INTRAVENOUS | Status: AC

## 2023-09-29 MED ORDER — SODIUM CHLORIDE 0.9 % IV SOLN: 10.0000 mg | Freq: Once | INTRAVENOUS | Status: DC

## 2023-09-29 MED ORDER — SODIUM CHLORIDE 0.9 % IV SOLN
1800.0000 mg | Freq: Once | INTRAVENOUS | Status: AC
  Administered 2023-09-29: 1800 mg via INTRAVENOUS
  Filled 2023-09-29: qty 26.28

## 2023-09-29 MED ORDER — DEXAMETHASONE SODIUM PHOSPHATE 10 MG/ML IJ SOLN: 10.0000 mg | Freq: Once | INTRAMUSCULAR | Status: DC

## 2023-09-29 NOTE — Patient Instructions (Signed)

## 2023-09-29 NOTE — Patient Instructions (Signed)
[facility]  Discharge Instructions: Thank you for choosing [facility] to provide your oncology and hematology care.   If you have a lab appointment with the Cancer Center, please go directly to the Cancer Center and check in at the registration area.   Wear comfortable clothing and clothing appropriate for easy access to any Portacath or PICC line.   We strive to give you quality time with your provider. You may need to reschedule your appointment if you arrive late (15 or more minutes).  Arriving late affects you and other patients whose appointments are after yours.  Also, if you miss three or more appointments without notifying the office, you may be dismissed from the clinic at the provider's discretion.      For prescription refill requests, have your pharmacy contact our office and allow 72 hours for refills to be completed.    Today you received the following chemotherapy and/or immunotherapy agents: cisplatin, gemcitabine      To help prevent nausea and vomiting after your treatment, we encourage you to take your nausea medication as directed.  BELOW ARE SYMPTOMS THAT SHOULD BE REPORTED IMMEDIATELY: *FEVER GREATER THAN 100.4 F (38 C) OR HIGHER *CHILLS OR SWEATING *NAUSEA AND VOMITING THAT IS NOT CONTROLLED WITH YOUR NAUSEA MEDICATION *UNUSUAL SHORTNESS OF BREATH *UNUSUAL BRUISING OR BLEEDING *URINARY PROBLEMS (pain or burning when urinating, or frequent urination) *BOWEL PROBLEMS (unusual diarrhea, constipation, pain near the anus) TENDERNESS IN MOUTH AND THROAT WITH OR WITHOUT PRESENCE OF ULCERS (sore throat, sores in mouth, or a toothache) UNUSUAL RASH, SWELLING OR PAIN  UNUSUAL VAGINAL DISCHARGE OR ITCHING   Items with * indicate a potential emergency and should be followed up as soon as possible or go to the Emergency Department if any problems should occur.  Please show the CHEMOTHERAPY ALERT CARD or IMMUNOTHERAPY ALERT  CARD at check-in to the Emergency Department and triage nurse.  Should you have questions after your visit or need to cancel or reschedule your appointment, please contact [facility]  Dept: [phone]  and follow the prompts.  Office hours are 8:00 a.m. to 4:30 p.m. Monday - Friday. Please note that voicemails left after 4:00 p.m. may not be returned until the following business day.  We are closed weekends and major holidays. You have access to a nurse at all times for urgent questions. Please call the main number to the clinic Dept: [phone] and follow the prompts.   For any non-urgent questions, you may also contact your provider using MyChart. We now offer e-Visits for anyone 47 and older to request care online for non-urgent symptoms. For details visit [email or web address].   Also download the MyChart app! Go to the app store, search "MyChart", open the app, select [facility], and log in with your MyChart username and password.  Gemcitabine Injection What is this medication? GEMCITABINE (jem SYE ta been) treats some types of cancer. It works by slowing down the growth of cancer cells. This medicine may be used for other purposes; ask your health care provider or pharmacist if you have questions. COMMON BRAND NAME(S): Gemzar, Infugem What should I tell my care team before I take this medication? They need to know if you have any of these conditions: Blood disorders Infection Kidney disease Liver disease Lung or breathing disease, such as asthma or COPD Recent or ongoing radiation therapy An unusual or allergic reaction to gemcitabine, other medications, foods, dyes, or preservatives  If you or your partner are pregnant or trying to get pregnant Breast-feeding How should I use this medication? This medication is injected into a vein. It is given by your care team in a hospital or clinic setting. Talk to your care team about the use of this  medication in children. Special care may be needed. Overdosage: If you think you have taken too much of this medicine contact a poison control center or emergency room at once. NOTE: This medicine is only for you. Do not share this medicine with others. What if I miss a dose? Keep appointments for follow-up doses. It is important not to miss your dose. Call your care team if you are unable to keep an appointment. What may interact with this medication? Interactions have not been studied. This list may not describe all possible interactions. Give your health care provider a list of all the medicines, herbs, non-prescription drugs, or dietary supplements you use. Also tell them if you smoke, drink alcohol, or use illegal drugs. Some items may interact with your medicine. What should I watch for while using this medication? Your condition will be monitored carefully while you are receiving this medication. This medication may make you feel generally unwell. This is not uncommon, as chemotherapy can affect healthy cells as well as cancer cells. Report any side effects. Continue your course of treatment even though you feel ill unless your care team tells you to stop. In some cases, you may be given additional medications to help with side effects. Follow all directions for their use. This medication may increase your risk of getting an infection. Call your care team for advice if you get a fever, chills, sore throat, or other symptoms of a cold or flu. Do not treat yourself. Try to avoid being around people who are sick. This medication may increase your risk to bruise or bleed. Call your care team if you notice any unusual bleeding. Be careful brushing or flossing your teeth or using a toothpick because you may get an infection or bleed more easily. If you have any dental work done, tell your dentist you are receiving this medication. Avoid taking medications that contain aspirin, acetaminophen,  ibuprofen, naproxen, or ketoprofen unless instructed by your care team. These medications may hide a fever. Talk to your care team if you or your partner wish to become pregnant or think you might be pregnant. This medication can cause serious birth defects if taken during pregnancy and for 6 months after the last dose. A negative pregnancy test is required before starting this medication. A reliable form of contraception is recommended while taking this medication and for 6 months after the last dose. Talk to your care team about effective forms of contraception. Do not father a child while taking this medication and for 3 months after the last dose. Use a condom while having sex during this time period. Do not breastfeed while taking this medication and for at least 1 week after the last dose. This medication may cause infertility. Talk to your care team if you are concerned about your fertility. What side effects may I notice from receiving this medication? Side effects that you should report to your care team as soon as possible: Allergic reactions--skin rash, itching, hives, swelling of the face, lips, tongue, or throat Capillary leak syndrome--stomach or muscle pain, unusual weakness or fatigue, feeling faint or lightheaded, decrease in the amount of urine, swelling of the ankles, hands, or feet, trouble breathing Infection--fever, chills,  cough, sore throat, wounds that don't heal, pain or trouble when passing urine, general feeling of discomfort or being unwell Liver injury--right upper belly pain, loss of appetite, nausea, light-colored stool, dark yellow or brown urine, yellowing skin or eyes, unusual weakness or fatigue Low red blood cell level--unusual weakness or fatigue, dizziness, headache, trouble breathing Lung injury--shortness of breath or trouble breathing, cough, spitting up blood, chest pain, fever Stomach pain, bloody diarrhea, pale skin, unusual weakness or fatigue, decrease in  the amount of urine, which may be signs of hemolytic uremic syndrome Sudden and severe headache, confusion, change in vision, seizures, which may be signs of posterior reversible encephalopathy syndrome (PRES) Unusual bruising or bleeding Side effects that usually do not require medical attention (report to your care team if they continue or are bothersome): Diarrhea Drowsiness Hair loss Nausea Pain, redness, or swelling with sores inside the mouth or throat Vomiting This list may not describe all possible side effects. Call your doctor for medical advice about side effects. You may report side effects to FDA at 1-800-FDA-1088. Where should I keep my medication? This medication is given in a hospital or clinic. It will not be stored at home. NOTE: This sheet is a summary. It may not cover all possible information. If you have questions about this medicine, talk to your doctor, pharmacist, or health care provider.  2024 [employer] Standard (2022-04-06 00:00:00) Cisplatin Injection What is this medication? CISPLATIN (SIS pla tin) treats some types of cancer. It works by slowing down the growth of cancer cells. This medicine may be used for other purposes; ask your health care provider or pharmacist if you have questions. COMMON BRAND NAME(S): Platinol, Platinol -AQ What should I tell my care team before I take this medication? They need to know if you have any of these conditions: Eye disease, vision problems Hearing problems Kidney disease Low blood counts, such as low white cells, platelets, or red blood cells Tingling of the fingers or toes, or other nerve disorder An unusual or allergic reaction to cisplatin, carboplatin, oxaliplatin, other medications, foods, dyes, or preservatives If you or your partner are pregnant or trying to get pregnant Breast-feeding How should I use this medication? This medication is injected into a vein. It is given by your care team in a hospital or  clinic setting. Talk to your care team about the use of this medication in children. Special care may be needed. Overdosage: If you think you have taken too much of this medicine contact a poison control center or emergency room at once. NOTE: This medicine is only for you. Do not share this medicine with others. What if I miss a dose? Keep appointments for follow-up doses. It is important not to miss your dose. Call your care team if you are unable to keep an appointment. What may interact with this medication? Do not take this medication with any of the following: Live virus vaccines This medication may also interact with the following: Certain antibiotics, such as amikacin, gentamicin, neomycin, polymyxin B, streptomycin, tobramycin, vancomycin Foscarnet This list may not describe all possible interactions. Give your health care provider a list of all the medicines, herbs, non-prescription drugs, or dietary supplements you use. Also tell them if you smoke, drink alcohol, or use illegal drugs. Some items may interact with your medicine. What should I watch for while using this medication? Your condition will be monitored carefully while you are receiving this medication. You may need blood work done while taking this medication.  This medication may make you feel generally unwell. This is not uncommon, as chemotherapy can affect healthy cells as well as cancer cells. Report any side effects. Continue your course of treatment even though you feel ill unless your care team tells you to stop. This medication may increase your risk of getting an infection. Call your care team for advice if you get a fever, chills, sore throat, or other symptoms of a cold or flu. Do not treat yourself. Try to avoid being around people who are sick. Avoid taking medications that contain aspirin, acetaminophen, ibuprofen, naproxen, or ketoprofen unless instructed by your care team. These medications may hide a  fever. This medication may increase your risk to bruise or bleed. Call your care team if you notice any unusual bleeding. Be careful brushing or flossing your teeth or using a toothpick because you may get an infection or bleed more easily. If you have any dental work done, tell your dentist you are receiving this medication. Drink fluids as directed while you are taking this medication. This will help protect your kidneys. Call your care team if you get diarrhea. Do not treat yourself. Talk to your care team if you or your partner wish to become pregnant or think you might be pregnant. This medication can cause serious birth defects if taken during pregnancy and for 14 months after the last dose. A negative pregnancy test is required before starting this medication. A reliable form of contraception is recommended while taking this medication and for 14 months after the last dose. Talk to your care team about effective forms of contraception. Do not father a child while taking this medication and for 11 months after the last dose. Use a condom during sex during this time period. Do not breast-feed while taking this medication. This medication may cause infertility. Talk to your care team if you are concerned about your fertility. What side effects may I notice from receiving this medication? Side effects that you should report to your care team as soon as possible: Allergic reactions--skin rash, itching, hives, swelling of the face, lips, tongue, or throat Eye pain, change in vision, vision loss Hearing loss, ringing in ears Infection--fever, chills, cough, sore throat, wounds that don't heal, pain or trouble when passing urine, general feeling of discomfort or being unwell Kidney injury--decrease in the amount of urine, swelling of the ankles, hands, or feet Low red blood cell level--unusual weakness or fatigue, dizziness, headache, trouble breathing Painful swelling, warmth, or redness of the skin,  blisters or sores at the infusion site Pain, tingling, or numbness in the hands or feet Unusual bruising or bleeding Side effects that usually do not require medical attention (report to your care team if they continue or are bothersome): Hair loss Nausea Vomiting This list may not describe all possible side effects. Call your doctor for medical advice about side effects. You may report side effects to FDA at 1-800-FDA-1088. Where should I keep my medication? This medication is given in a hospital or clinic. It will not be stored at home. NOTE: This sheet is a summary. It may not cover all possible information. If you have questions about this medicine, talk to your doctor, pharmacist, or health care provider.  2024 [employer] Standard (2022-04-02 00:00:00)

## 2023-09-30 ENCOUNTER — Telehealth: Payer: Self-pay

## 2023-09-30 NOTE — Telephone Encounter (Signed)
Called patient for first time chemo follow-up.  Left voice mail for patient to contact office if she had any questions or concerns.

## 2023-10-03 ENCOUNTER — Other Ambulatory Visit (HOSPITAL_BASED_OUTPATIENT_CLINIC_OR_DEPARTMENT_OTHER): Payer: Self-pay

## 2023-10-03 ENCOUNTER — Ambulatory Visit: Payer: 59 | Admitting: Physician Assistant

## 2023-10-03 ENCOUNTER — Other Ambulatory Visit: Payer: Self-pay

## 2023-10-03 ENCOUNTER — Encounter: Payer: Self-pay | Admitting: Physician Assistant

## 2023-10-03 VITALS — BP 110/70 | HR 102 | Ht 64.5 in | Wt 171.0 lb

## 2023-10-03 DIAGNOSIS — C250 Malignant neoplasm of head of pancreas: Secondary | ICD-10-CM | POA: Diagnosis not present

## 2023-10-03 DIAGNOSIS — R112 Nausea with vomiting, unspecified: Secondary | ICD-10-CM | POA: Diagnosis not present

## 2023-10-03 DIAGNOSIS — K5903 Drug induced constipation: Secondary | ICD-10-CM

## 2023-10-03 MED ORDER — NALOXEGOL OXALATE 12.5 MG PO TABS
12.5000 mg | ORAL_TABLET | Freq: Every day | ORAL | 11 refills | Status: AC
  Filled 2023-10-03: qty 30, 30d supply, fill #0

## 2023-10-03 NOTE — Progress Notes (Signed)
Chief Complaint: Nausea and constipation, Pancreatic cancer  HPI:    Wendy Hoffman is a 57 year old African-American female with a past medical history as listed below including pancreatic cancer, known to Dr. Meridee Score, who was referred to me by [name],* for a complaint of nausea and constipation in a patient with pancreatic cancer.      08/04/2023 enteroscopy done for generalized abdominal pain and nausea with vomiting.  At that time white nummular lesions in the esophagus positive for Candida, retained gastric fluid removal of 400 mL, erythematous mucosa in the stomach, mucosal changes in D2/D3 with 2 areas of narrowing requiring pressure to allow scope passage of the therapeutic endoscope within D2 and D2/D3 junction, decision made to attempt enteral stenting, prosthesis placed.  Discussed possible gastroparesis playing a role in her symptoms.    09/26/2023 patient followed with oncology.  At that time being seen prior to beginning her chemotherapy.    09/26/2023 CT of the chest abdomen pelvis for pancreatic cancer restaging with interval progression of the disease, bilateral pulmonary nodules, bulky hepatic metastasis with interval increase, potential new 13 mm left paraesophageal node, stable appearance of the pancreatic head lesion with diffuse dilation of the main pancreatic duct.    Today, patient presents to clinic but she is a poor historian, it is hard for her to really relay how she is feeling.  Initially telling me she was having nausea and vomiting and that nothing was better and she had no appetite since the stent was placed back in August, but further on in discussion she describes that everything is better and she is only having nausea with no further vomiting and is actually not having any symptoms 2 to 3 days out of the week.  She is on a liquid diet.  Describes epigastric pain rated as a 10/10.      Also later describes constipation, she is on morphine.  Currently  using Dulcolax which does not really help, sometimes can go many days without a bowel movement at all and at first tells me she went 2 months without a bowel movement, but then relates that she has small little bits but does not feel empty.  She is passing gas.    Denies fever or chills.  Prior workup (and see oncology note 09/26/2023 for further imaging and details): -CT abdomen/pelvis without contrast 02/16/2021-multiple large hypoechoic masses in the patient's liver consistent metastatic disease, ill-defined masslike area in the pancreatic head measuring approximate 4.7 cm, possible filling defect in the right ventricle. -EGD 02/19/2021-large infiltrative mass with bleeding in the second portion of the duodenum, appears to be arising near the ampulla, partially obstructive with friable mucosa.  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 -Flexible sigmoidoscopy 02/19/2021-diverticulosis in the sigmoid colon, nonbleeding external and internal hemorrhoids -Cardiac CT 02/20/2021-mass in the mid RV attached to the interventricular septum measuring 16 mm x 6 mm and concerning for metastatic tumor -MRI abdomen with/without contrast 02/21/2021-mass in the posterior pancreatic head measuring 4.3 x 3.8 cm with obstruction of the central pancreatic duct and diffuse dilatation up to 6 mm consistent with pancreatic adenocarcinoma, liver lesions the largest mass of the central left lobe measuring 9.9 x 9.2 cm consistent with hepatic metastatic disease,  hepatomegaly. -Ultrasound-guided biopsy of a left liver lesion 02/24/2021-adenocarcinoma, morphologically similar to the duodenal biopsy; microsatellite stable, tumor mutation burden 1 -MRI abdomen 05/23/2021-decrease in size of pancreas mass and hepatic lesions, no progressive disease  -CT 06/17/2021-multiple liver metastases similar to her prior  exam.  Pancreas mass not significantly changed. -CT 06/26/2021-stable pancreas mass, no significant  change in size and number of known liver metastases. -CT abdomen/pelvis 07/12/2021-new gastrostomy tube.  Multiple liver masses slightly decreased in size from prior exam.  Pancreas head mass unchanged. -CTs 10/29/2021-mild progression of multifocal liver metastases; mild increase in size of hypoenhancing mass within the head of the pancreas; new mild pleural nodularity along the posterior lower lobes  -CT abdomen/pelvis 01/04/2022-decrease size of pancreas head mass, no change in multifocal hepatic metastases  -CT abdomen/pelvis 04/22/2022-mixed response in the liver, some lesions larger, some smaller, no change in pancreas head mass, no evidence of metastatic disease elsewhere in the abdomen or pelvis  Past Medical History:  Diagnosis Date   Anemia    Anxiety    ARF (acute renal failure) (HCC) 06/18/2021   Arthritis    Asthma    Cervical ca (HCC)    H/O: hysterectomy    High cholesterol    Hypertension    Hypothyroidism    Insomnia    Migraine    Morbid obesity (HCC)    Pancreatic cancer (HCC) 02/2021   Shortness of breath    Stroke [facility])    Thyroid disease     Past Surgical History:  Procedure Laterality Date   ABDOMINAL HYSTERECTOMY     BIOPSY  02/19/2021   Procedure: BIOPSY;  Surgeon: Napoleon Form, MD;  Location: [facility];  Service: Endoscopy;;   BIOPSY  06/19/2021   Procedure: BIOPSY;  Surgeon: Kathi Der, MD;  Location: [facility];  Service: Gastroenterology;;   BIOPSY  08/04/2023   Procedure: BIOPSY;  Surgeon: Lemar Lofty., MD;  Location: [facility];  Service: Gastroenterology;;   CESAREAN SECTION     3 occasions   CHOLECYSTECTOMY     DUODENAL STENT PLACEMENT N/A 08/04/2023   Procedure: DUODENAL STENT PLACEMENT;  Surgeon: Lemar Lofty., MD;  Location: [facility];  Service: Gastroenterology;  Laterality: N/A;   ENTEROSCOPY N/A 08/04/2023   Procedure: ENTEROSCOPY;  Surgeon: Meridee Score Netty Starring., MD;  Location: [facility];   Service: Gastroenterology;  Laterality: N/A;   ESOPHAGEAL BRUSHING  08/04/2023   Procedure: ESOPHAGEAL BRUSHING;  Surgeon: Lemar Lofty., MD;  Location: [facility];  Service: Gastroenterology;;   ESOPHAGOGASTRODUODENOSCOPY (EGD) WITH PROPOFOL N/A 02/19/2021   Procedure: ESOPHAGOGASTRODUODENOSCOPY (EGD) WITH PROPOFOL;  Surgeon: Napoleon Form, MD;  Location: [facility];  Service: Endoscopy;  Laterality: N/A;   ESOPHAGOGASTRODUODENOSCOPY (EGD) WITH PROPOFOL N/A 06/19/2021   Procedure: ESOPHAGOGASTRODUODENOSCOPY (EGD) WITH PROPOFOL;  Surgeon: Kathi Der, MD;  Location: [facility];  Service: Gastroenterology;  Laterality: N/A;   FLEXIBLE SIGMOIDOSCOPY N/A 02/19/2021   Procedure: FLEXIBLE SIGMOIDOSCOPY;  Surgeon: Napoleon Form, MD;  Location: [facility];  Service: Endoscopy;  Laterality: N/A;   IR GASTROSTOMY TUBE MOD SED  07/01/2021   IR GASTROSTOMY TUBE REMOVAL  08/21/2021   IR IMAGING GUIDED PORT INSERTION  02/24/2021   IR US GUIDE BX ASP/DRAIN  02/24/2021   THYROIDECTOMY, PARTIAL     Goiter    Current Outpatient Medications  Medication Sig Dispense Refill   albuterol (VENTOLIN HFA) 108 (90 Base) MCG/ACT inhaler INHALE 1 TO 2 PUFFS BY MOUTH EVERY 6 HOURS AS NEEDED FOR WHEEZING AND FOR SHORTNESS OF BREATH 54 g 0   bisacodyl (DULCOLAX) 5 MG EC tablet Take 2 tablets (10 mg total) by mouth daily as needed for moderate constipation. 60 tablet 2   DULoxetine (CYMBALTA) 30 MG capsule TAKE 2 CAPSULES BY MOUTH DAILY 180 capsule 0  fluconazole (DIFLUCAN) 100 MG tablet Take 1 tablet (100 mg total) by mouth daily. 7 tablet 0   furosemide (LASIX) 20 MG tablet Take 1 tablet (20 mg total) by mouth 2 (two) times daily as needed for edema. (Patient not taking: Reported on 08/17/2023) 60 tablet 1   gabapentin (NEURONTIN) 300 MG capsule Take 1 capsule (300 mg total) by mouth at bedtime. 90 capsule 0   hydrocortisone-pramoxine (PROCTOFOAM HC) rectal foam Place 1 applicator rectally 3  (three) times daily. 10 g 0   lidocaine-prilocaine (EMLA) cream Apply 1 tablespoon to port site 2 hours prior to stick to numb site. 30 g 2   magic mouthwash (nystatin, diphenhydrAMINE, alum & mag hydroxide) suspension mixture Swish 5 mL for 1 minute and spit four times daily as needed for mouth pain. 240 mL 1   morphine (MS CONTIN) 60 MG 12 hr tablet Take 1 tablet (60 mg total) by mouth every 12 (twelve) hours. 60 tablet 0   Morphine Sulfate (MORPHINE CONCENTRATE) 10 MG/0.5ML SOLN concentrated solution Place 1-1.5 mLs (20-30 mg total) under the tongue every 8 (eight) hours as needed for severe pain (pain not relieved with oxycodone). 30 mL 0   ondansetron (ZOFRAN) 8 MG tablet Take 1 tablet (8 mg total) by mouth every 8 (eight) hours as needed for nausea or vomiting. 90 tablet 0   ondansetron (ZOFRAN-ODT) 8 MG disintegrating tablet Take 1 tablet (8 mg total) by mouth every 8 (eight) hours as needed for nausea or vomiting. 90 tablet 0   Oxycodone HCl 10 MG TABS Take 1-2 tablets (10-20 mg total) by mouth every 4 (four) hours as needed (Pain). 60 tablet 0   pantoprazole (PROTONIX) 40 MG tablet TAKE 1 TABLET(40 MG) BY MOUTH TWICE DAILY 180 tablet 0   potassium chloride (KLOR-CON M) 10 MEQ tablet TAKE 1 TABLET BY MOUTH DAILY AS DIRECTED WHEN USING FUROSEMIDE 90 tablet 0   predniSONE (DELTASONE) 10 MG tablet Take 1 tablet (10 mg total) by mouth daily with breakfast. 30 tablet 0   prochlorperazine (COMPAZINE) 10 MG tablet TAKE 1 TABLET(10 MG) BY MOUTH EVERY 6 HOURS AS NEEDED FOR NAUSEA OR VOMITING 120 tablet 0   scopolamine (TRANSDERM-SCOP) 1 MG/3DAYS Place 1 patch (1.5 mg total) onto the skin every 3 (three) days. 30 patch 0   SYMBICORT 160-4.5 MCG/ACT inhaler INHALE 2 PUFFS INTO THE LUNGS TWICE DAILY 10.2 g 1   zolpidem (AMBIEN) 10 MG tablet Take 1 tablet (10 mg total) by mouth at bedtime as needed for sleep. 30 tablet 0   No current facility-administered medications for this visit.    Allergies as of  10/03/2023 - Review Complete 09/29/2023  Allergen Reaction Noted   Blueberry [vaccinium angustifolium] Anaphylaxis 04/30/2013   Mango flavor Anaphylaxis 04/30/2013   Oxaliplatin Shortness Of Breath and Itching 04/15/2021   Reglan [metoclopramide] Nausea And Vomiting 02/24/2021   Aspirin Rash    Penicillins Hives     Family History  Problem Relation Age of Onset   Migraines Sister    Non-Hodgkin's lymphoma Other        maternal half brother's grandson   Non-Hodgkin's lymphoma Half-Brother        maternal half brother; dx 49s or 70s   Pancreatic cancer Other 65       MGF's niece    Social History   Socioeconomic History   Marital status: Single    Spouse name: Not on file   Number of children: Not on file   Years of education:  Not on file   Highest education level: Some college, no degree  Occupational History   Not on file  Tobacco Use   Smoking status: Former    Types: Cigarettes   Smokeless tobacco: Never  Vaping Use   Vaping status: Never Used  Substance and Sexual Activity   Alcohol use: No   Drug use: No   Sexual activity: Not on file  Other Topics Concern   Not on file  Social History Narrative   Not on file   Social Determinants of Health   Financial Resource Strain: Medium Risk (09/01/2023)   Overall Financial Resource Strain (CARDIA)    Difficulty of Paying Living Expenses: Somewhat hard  Food Insecurity: No Food Insecurity (09/01/2023)   Hunger Vital Sign    Worried About Running Out of Food in the Last Year: Never true    Ran Out of Food in the Last Year: Never true  Transportation Needs: Unmet Transportation Needs (09/01/2023)   PRAPARE - Transportation    Lack of Transportation (Medical): Yes    Lack of Transportation (Non-Medical): Yes  Physical Activity: Unknown (09/01/2023)   Exercise Vital Sign    Days of Exercise per Week: 0 days    Minutes of Exercise per Session: Not on file  Stress: Stress Concern Present (09/01/2023)   [employer]  of Occupational Health - Occupational Stress Questionnaire    Feeling of Stress : Very much  Social Connections: Socially Isolated (09/01/2023)   Social Connection and Isolation Panel [NHANES]    Frequency of Communication with Friends and Family: More than three times a week    Frequency of Social Gatherings with Friends and Family: Never    Attends Religious Services: Never    Database administrator or Organizations: No    Attends Banker Meetings: Never    Marital Status: Divorced  Catering manager Violence: Not At Risk (12/14/2022)   Humiliation, Afraid, Rape, and Kick questionnaire    Fear of Current or Ex-Partner: No    Emotionally Abused: No    Physically Abused: No    Sexually Abused: No    Review of Systems:    Constitutional: No fever or chills Cardiovascular: No chest pain Respiratory: No SOB  Gastrointestinal: See HPI and otherwise negative   Physical Exam:  Vital signs: BP 110/70   Pulse (!) 102   Ht 5' 4.5" (1.638 m)   Wt 171 lb (77.6 kg)   BMI 28.90 kg/m    Constitutional:   Pleasant AA female appears to be in NAD, Well developed, Well nourished, alert and cooperative Respiratory: Respirations even and unlabored. Lungs clear to auscultation bilaterally.   No wheezes, crackles, or rhonchi.  Cardiovascular: Normal S1, S2. No MRG. Regular rate and rhythm. No peripheral edema, cyanosis or pallor.  Gastrointestinal:  Soft, nondistended, moderate epigastric TTP no rebound or guarding. Normal bowel sounds. No appreciable masses or hepatomegaly. Psychiatric: Oriented to person, place and time.  Poor judgment, minimal history  RELEVANT LABS AND IMAGING: CBC    Component Value Date/Time   WBC 6.3 09/29/2023 0855   WBC 7.6 04/19/2023 1608   RBC 3.20 (L) 09/29/2023 0855   HGB 8.2 (L) 09/29/2023 0855   HCT 26.0 (L) 09/29/2023 0855   PLT 390 09/29/2023 0855   MCV 81.3 09/29/2023 0855   MCH 25.6 (L) 09/29/2023 0855   MCHC 31.5 09/29/2023 0855   RDW 18.9  (H) 09/29/2023 0855   LYMPHSABS 0.6 (L) 09/29/2023 0855   MONOABS 0.6 09/29/2023 1610  EOSABS 0.0 09/29/2023 0855   BASOSABS 0.0 09/29/2023 0855    CMP     Component Value Date/Time   NA 133 (L) 09/29/2023 0855   NA 141 11/01/2019 1559   K 4.7 09/29/2023 0855   CL 96 (L) 09/29/2023 0855   CO2 30 09/29/2023 0855   GLUCOSE 96 09/29/2023 0855   BUN 23 (H) 09/29/2023 0855   BUN 12 11/01/2019 1559   CREATININE 1.22 (H) 09/29/2023 0855   CREATININE 0.71 11/16/2013 1528   CALCIUM 9.3 09/29/2023 0855   PROT 6.8 09/29/2023 0855   ALBUMIN 3.0 (L) 09/29/2023 0855   AST 19 09/29/2023 0855   ALT 7 09/29/2023 0855   ALKPHOS 156 (H) 09/29/2023 0855   BILITOT 0.7 09/29/2023 0855   GFRNONAA 52 (L) 09/29/2023 0855   GFRNONAA >89 11/16/2013 1528   GFRAA >60 07/21/2020 1857   GFRAA >89 11/16/2013 1528    Assessment: 1.  Nausea with question vomiting: Continues per patient with a decrease in appetite even after enteral stenting back in August, does have known pancreatic cancer on chemotherapy which is likely contributing 2.  Constipation: Per patient no help from Dulcolax, she is on Morphine which is likely contributing  3.  Pancreatic cancer: Recent imaging showing progression of the disease, following with oncology  Plan: 1.  Started the patient on Movantik once daily.  Provided her with samples and sent a prescription #30 with 5 refills. 2.  Discussed nausea with the patient, not sure if she still vomiting or not.  We discussed scheduling out her Zofran 8 mg every 6 hours instead of taking as needed.  We sent in a new prescription for her #120 with 3 refills. 3.  Will reach out to Dr. Meridee Score, not sure if another enteroscopy would be helpful?  Or again if there is an element of gastroparesis? 4.  Patient to follow in clinic with [place] as needed/recommended by Dr. Meridee Score.  Hyacinth Meeker, PA-C [facility] 10/03/2023, 11:36 AM  Cc: [name],*

## 2023-10-03 NOTE — Patient Instructions (Signed)
We have provided you with samples of Movantik 12.5mg  to try: Take one tablet by mouth daily.  _______________________________________________________  If your blood pressure at your visit was 140/90 or greater, please contact your primary care physician to follow up on this.  _______________________________________________________  If you are age 55 or older, your body mass index should be between 23-30. Your Body mass index is 28.9 kg/m. If this is out of the aforementioned range listed, please consider follow up with your Primary Care Provider.  If you are age 35 or younger, your body mass index should be between 19-25. Your Body mass index is 28.9 kg/m. If this is out of the aformentioned range listed, please consider follow up with your Primary Care Provider.   ________________________________________________________  The [facility] providers would like to encourage you to use [facility] to communicate with providers for non-urgent requests or questions.  Due to long hold times on the telephone, sending your provider a message by [facility] may be a faster and more efficient way to get a response.  Please allow 48 business hours for a response.  Please remember that this is for non-urgent requests.  _______________________________________________________  I appreciate the opportunity to care for you. Hyacinth Meeker, PA-C

## 2023-10-04 ENCOUNTER — Telehealth: Payer: Self-pay | Admitting: Family

## 2023-10-04 ENCOUNTER — Other Ambulatory Visit: Payer: Self-pay

## 2023-10-04 MED ORDER — FUROSEMIDE 20 MG PO TABS: 20.0000 mg | ORAL_TABLET | Freq: Two times a day (BID) | ORAL | 1 refills | Status: AC | PRN

## 2023-10-04 NOTE — Telephone Encounter (Signed)
Rx sent in

## 2023-10-04 NOTE — Addendum Note (Signed)
Addended by: Judieth Keens on: 10/04/2023 01:44 PM   Modules accepted: Orders

## 2023-10-04 NOTE — Telephone Encounter (Signed)
Pt requesting refill on her furosemide. Pt has been without for a couple of months and both legs are swollen. Please send to [facility] on [address].

## 2023-10-05 ENCOUNTER — Telehealth: Payer: Self-pay | Admitting: *Deleted

## 2023-10-05 ENCOUNTER — Other Ambulatory Visit: Payer: Self-pay | Admitting: *Deleted

## 2023-10-05 DIAGNOSIS — C250 Malignant neoplasm of head of pancreas: Secondary | ICD-10-CM

## 2023-10-05 DIAGNOSIS — R112 Nausea with vomiting, unspecified: Secondary | ICD-10-CM

## 2023-10-05 NOTE — Telephone Encounter (Signed)
Called patient to inform of recommendations per Dr. Meridee Score. Ordered Upper GI series in future. Patient not available, left message to call back if she has any questions. Notified the patient that she would get a call from radiology to schedule the x-ray. Left detailed message on VM per DPR.

## 2023-10-05 NOTE — Telephone Encounter (Signed)
-----   Message from Unk Lightning sent at 10/03/2023  3:09 PM EDT ----- Regarding: FW: repeat entero? Alona Bene,  Can you please call patient and let her know that Dr. Meridee Score reviewed her chart as well and visit from today and recommends a repeat upper GI series (x-ray) to make sure she does not still have an outlet obstruction causing her symptoms.  Thanks, JL L ----- Message ----- From: Lemar Lofty., MD Sent: 10/03/2023   2:38 PM EDT To: Unk Lightning, PA Subject: RE: repeat entero?                             JLL, Would not plan for repeat EGD/enteroscopy for now.  Would begin with an upper GI series if she is still having concerns for outlet obstruction and to evaluate the stent. GM ----- Message ----- From: Unk Lightning, PA Sent: 10/03/2023   2:00 PM EDT To: Lemar Lofty., MD Subject: repeat entero?                                 Just wanted to let you know I saw this patient in clinic today, history from her is somewhat limited/confusing.  Just was not sure if you were planning on repeating an enteroscopy or not, could not tell from your note, not sure if we need to do something in the interim otherwise.  You can see what I did today.  Thanks for your recs Hyacinth Meeker, PA-C

## 2023-10-06 ENCOUNTER — Other Ambulatory Visit (HOSPITAL_COMMUNITY): Payer: Self-pay

## 2023-10-06 ENCOUNTER — Other Ambulatory Visit: Payer: Self-pay

## 2023-10-06 ENCOUNTER — Other Ambulatory Visit (HOSPITAL_BASED_OUTPATIENT_CLINIC_OR_DEPARTMENT_OTHER): Payer: Self-pay

## 2023-10-06 ENCOUNTER — Telehealth: Payer: Self-pay | Admitting: *Deleted

## 2023-10-06 ENCOUNTER — Other Ambulatory Visit: Payer: Self-pay | Admitting: Nurse Practitioner

## 2023-10-06 DIAGNOSIS — C250 Malignant neoplasm of head of pancreas: Secondary | ICD-10-CM

## 2023-10-06 DIAGNOSIS — J4540 Moderate persistent asthma, uncomplicated: Secondary | ICD-10-CM

## 2023-10-06 DIAGNOSIS — C259 Malignant neoplasm of pancreas, unspecified: Secondary | ICD-10-CM

## 2023-10-06 MED ORDER — ZOLPIDEM TARTRATE 10 MG PO TABS
10.0000 mg | ORAL_TABLET | Freq: Every evening | ORAL | 1 refills | Status: AC | PRN
  Filled 2023-10-06: qty 30, 30d supply, fill #0

## 2023-10-06 MED ORDER — GABAPENTIN 300 MG PO CAPS
300.0000 mg | ORAL_CAPSULE | Freq: Every day | ORAL | 0 refills | Status: AC
  Filled 2023-10-06: qty 90, 90d supply, fill #0

## 2023-10-06 MED ORDER — SYMBICORT 160-4.5 MCG/ACT IN AERO
2.0000 | INHALATION_SPRAY | Freq: Two times a day (BID) | RESPIRATORY_TRACT | 3 refills | Status: AC
  Filled 2023-10-06: qty 10.2, 30d supply, fill #0

## 2023-10-06 MED ORDER — PROCHLORPERAZINE MALEATE 10 MG PO TABS
10.0000 mg | ORAL_TABLET | Freq: Four times a day (QID) | ORAL | 1 refills | Status: AC | PRN
  Filled 2023-10-06: qty 120, 30d supply, fill #0

## 2023-10-06 MED ORDER — OXYCODONE HCL 10 MG PO TABS
10.0000 mg | ORAL_TABLET | ORAL | 0 refills | Status: DC | PRN
  Filled 2023-10-06: qty 60, 5d supply, fill #0

## 2023-10-06 MED ORDER — PANTOPRAZOLE SODIUM 40 MG PO TBEC
40.0000 mg | DELAYED_RELEASE_TABLET | Freq: Two times a day (BID) | ORAL | 0 refills | Status: DC
Start: 2023-10-06 — End: 2023-11-04
  Filled 2023-10-06: qty 180, 90d supply, fill #0

## 2023-10-06 MED ORDER — ONDANSETRON HCL 8 MG PO TABS
8.0000 mg | ORAL_TABLET | Freq: Three times a day (TID) | ORAL | 2 refills | Status: AC | PRN
  Filled 2023-10-06: qty 90, 30d supply, fill #0

## 2023-10-06 MED ORDER — MORPHINE SULFATE ER 60 MG PO TBCR
60.0000 mg | EXTENDED_RELEASE_TABLET | Freq: Two times a day (BID) | ORAL | 0 refills | Status: AC
  Filled 2023-10-06: qty 60, 30d supply, fill #0

## 2023-10-06 MED ORDER — SCOPOLAMINE 1 MG/3DAYS TD PT72
1.0000 | MEDICATED_PATCH | TRANSDERMAL | 1 refills | Status: AC
  Filled 2023-10-06: qty 30, 90d supply, fill #0

## 2023-10-06 MED ORDER — ONDANSETRON 8 MG PO TBDP
8.0000 mg | ORAL_TABLET | Freq: Three times a day (TID) | ORAL | 2 refills | Status: AC | PRN
  Filled 2023-10-06: qty 90, 30d supply, fill #0

## 2023-10-06 MED ORDER — ALBUTEROL SULFATE HFA 108 (90 BASE) MCG/ACT IN AERS
1.0000 | INHALATION_SPRAY | Freq: Four times a day (QID) | RESPIRATORY_TRACT | 3 refills | Status: AC | PRN
Start: 2023-10-06 — End: ?
  Filled 2023-10-06: qty 54, 75d supply, fill #0

## 2023-10-06 MED ORDER — MORPHINE SULFATE (CONCENTRATE) 10 MG/0.5ML PO SOLN
20.0000 mg | Freq: Three times a day (TID) | ORAL | 0 refills | Status: AC | PRN
  Filled 2023-10-06: qty 30, 7d supply, fill #0

## 2023-10-06 NOTE — Telephone Encounter (Signed)
Patient called to request refill on both forms of morphine sulfate, ambien, oxycodone and various other meds--reviewed list with her. Dr. Truett Perna agrees to manage her medications except for the [facility].

## 2023-10-10 ENCOUNTER — Inpatient Hospital Stay: Payer: 59 | Admitting: Nurse Practitioner

## 2023-10-10 ENCOUNTER — Inpatient Hospital Stay: Payer: 59

## 2023-10-10 ENCOUNTER — Other Ambulatory Visit: Payer: 59

## 2023-10-11 ENCOUNTER — Ambulatory Visit: Payer: 59 | Admitting: Orthopaedic Surgery

## 2023-10-16 ENCOUNTER — Other Ambulatory Visit: Payer: Self-pay | Admitting: Oncology

## 2023-10-17 ENCOUNTER — Inpatient Hospital Stay: Payer: 59

## 2023-10-17 ENCOUNTER — Other Ambulatory Visit (HOSPITAL_BASED_OUTPATIENT_CLINIC_OR_DEPARTMENT_OTHER): Payer: Self-pay

## 2023-10-17 ENCOUNTER — Inpatient Hospital Stay: Payer: 59 | Attending: Nurse Practitioner

## 2023-10-17 ENCOUNTER — Inpatient Hospital Stay (HOSPITAL_BASED_OUTPATIENT_CLINIC_OR_DEPARTMENT_OTHER): Payer: 59 | Admitting: Nurse Practitioner

## 2023-10-17 ENCOUNTER — Telehealth: Payer: Self-pay

## 2023-10-17 ENCOUNTER — Encounter: Payer: Self-pay | Admitting: Nurse Practitioner

## 2023-10-17 ENCOUNTER — Other Ambulatory Visit: Payer: Self-pay

## 2023-10-17 VITALS — BP 96/77 | HR 100 | Temp 98.2°F | Resp 18 | Ht 64.0 in | Wt 161.0 lb

## 2023-10-17 VITALS — BP 112/74 | HR 102 | Temp 98.4°F | Resp 18

## 2023-10-17 DIAGNOSIS — I10 Essential (primary) hypertension: Secondary | ICD-10-CM | POA: Diagnosis not present

## 2023-10-17 DIAGNOSIS — D509 Iron deficiency anemia, unspecified: Secondary | ICD-10-CM | POA: Diagnosis not present

## 2023-10-17 DIAGNOSIS — C259 Malignant neoplasm of pancreas, unspecified: Secondary | ICD-10-CM | POA: Diagnosis not present

## 2023-10-17 DIAGNOSIS — E039 Hypothyroidism, unspecified: Secondary | ICD-10-CM | POA: Diagnosis not present

## 2023-10-17 DIAGNOSIS — C787 Secondary malignant neoplasm of liver and intrahepatic bile duct: Secondary | ICD-10-CM | POA: Insufficient documentation

## 2023-10-17 DIAGNOSIS — Z923 Personal history of irradiation: Secondary | ICD-10-CM | POA: Insufficient documentation

## 2023-10-17 DIAGNOSIS — C250 Malignant neoplasm of head of pancreas: Secondary | ICD-10-CM | POA: Insufficient documentation

## 2023-10-17 DIAGNOSIS — E876 Hypokalemia: Secondary | ICD-10-CM | POA: Diagnosis not present

## 2023-10-17 DIAGNOSIS — R918 Other nonspecific abnormal finding of lung field: Secondary | ICD-10-CM | POA: Diagnosis not present

## 2023-10-17 DIAGNOSIS — G43909 Migraine, unspecified, not intractable, without status migrainosus: Secondary | ICD-10-CM | POA: Diagnosis not present

## 2023-10-17 DIAGNOSIS — B370 Candidal stomatitis: Secondary | ICD-10-CM | POA: Insufficient documentation

## 2023-10-17 DIAGNOSIS — E46 Unspecified protein-calorie malnutrition: Secondary | ICD-10-CM | POA: Insufficient documentation

## 2023-10-17 DIAGNOSIS — E785 Hyperlipidemia, unspecified: Secondary | ICD-10-CM | POA: Diagnosis not present

## 2023-10-17 DIAGNOSIS — J45909 Unspecified asthma, uncomplicated: Secondary | ICD-10-CM | POA: Insufficient documentation

## 2023-10-17 DIAGNOSIS — Z8673 Personal history of transient ischemic attack (TIA), and cerebral infarction without residual deficits: Secondary | ICD-10-CM | POA: Insufficient documentation

## 2023-10-17 DIAGNOSIS — Z95828 Presence of other vascular implants and grafts: Secondary | ICD-10-CM

## 2023-10-17 LAB — CBC WITH DIFFERENTIAL (CANCER CENTER ONLY)
Abs Immature Granulocytes: 0.03 10*3/uL (ref 0.00–0.07)
Basophils Absolute: 0 10*3/uL (ref 0.0–0.1)
Basophils Relative: 0 %
Eosinophils Absolute: 0 10*3/uL (ref 0.0–0.5)
Eosinophils Relative: 1 %
HCT: 25.1 % — ABNORMAL LOW (ref 36.0–46.0)
Hemoglobin: 7.9 g/dL — ABNORMAL LOW (ref 12.0–15.0)
Immature Granulocytes: 1 %
Lymphocytes Relative: 15 %
Lymphs Abs: 0.9 10*3/uL (ref 0.7–4.0)
MCH: 25.9 pg — ABNORMAL LOW (ref 26.0–34.0)
MCHC: 31.5 g/dL (ref 30.0–36.0)
MCV: 82.3 fL (ref 80.0–100.0)
Monocytes Absolute: 0.9 10*3/uL (ref 0.1–1.0)
Monocytes Relative: 15 %
Neutro Abs: 4 10*3/uL (ref 1.7–7.7)
Neutrophils Relative %: 68 %
Platelet Count: 711 10*3/uL — ABNORMAL HIGH (ref 150–400)
RBC: 3.05 MIL/uL — ABNORMAL LOW (ref 3.87–5.11)
RDW: 21.3 % — ABNORMAL HIGH (ref 11.5–15.5)
WBC Count: 5.8 10*3/uL (ref 4.0–10.5)
nRBC: 0 % (ref 0.0–0.2)

## 2023-10-17 LAB — CMP (CANCER CENTER ONLY)
ALT: 8 U/L (ref 0–44)
AST: 18 U/L (ref 15–41)
Albumin: 2.9 g/dL — ABNORMAL LOW (ref 3.5–5.0)
Alkaline Phosphatase: 149 U/L — ABNORMAL HIGH (ref 38–126)
Anion gap: 7 (ref 5–15)
BUN: 25 mg/dL — ABNORMAL HIGH (ref 6–20)
CO2: 29 mmol/L (ref 22–32)
Calcium: 9.5 mg/dL (ref 8.9–10.3)
Chloride: 99 mmol/L (ref 98–111)
Creatinine: 1.13 mg/dL — ABNORMAL HIGH (ref 0.44–1.00)
GFR, Estimated: 57 mL/min — ABNORMAL LOW (ref >60)
Glucose, Bld: 81 mg/dL (ref 70–99)
Potassium: 4.8 mmol/L (ref 3.5–5.1)
Sodium: 135 mmol/L (ref 135–145)
Total Bilirubin: 1.3 mg/dL — ABNORMAL HIGH (ref <1.2)
Total Protein: 6.4 g/dL — ABNORMAL LOW (ref 6.5–8.1)

## 2023-10-17 LAB — MAGNESIUM: Magnesium: 2.1 mg/dL (ref 1.7–2.4)

## 2023-10-17 LAB — FERRITIN: Ferritin: 314 ng/mL — ABNORMAL HIGH (ref 11–307)

## 2023-10-17 MED ORDER — CLOTRIMAZOLE 10 MG MT TROC
10.0000 mg | Freq: Four times a day (QID) | OROMUCOSAL | 1 refills | Status: DC
  Filled 2023-10-17: qty 120, 30d supply, fill #0

## 2023-10-17 MED ORDER — FLUCONAZOLE 100 MG PO TABS
ORAL_TABLET | ORAL | 0 refills | Status: DC
  Filled 2023-10-17: qty 9, 8d supply, fill #0

## 2023-10-17 MED ORDER — SODIUM CHLORIDE 0.9% FLUSH
10.0000 mL | Freq: Once | INTRAVENOUS | Status: AC
  Administered 2023-10-17: 10 mL

## 2023-10-17 MED ORDER — SODIUM CHLORIDE 0.9 % IV SOLN
INTRAVENOUS | Status: AC
Start: 2023-10-17 — End: 2023-10-17

## 2023-10-17 MED ORDER — HEPARIN SOD (PORK) LOCK FLUSH 100 UNIT/ML IV SOLN
500.0000 [IU] | Freq: Once | INTRAVENOUS | Status: AC
  Administered 2023-10-17: 500 [IU]

## 2023-10-17 MED ORDER — OXYCODONE HCL 10 MG PO TABS
10.0000 mg | ORAL_TABLET | ORAL | 0 refills | Status: AC | PRN
  Filled 2023-10-17: qty 60, 5d supply, fill #0

## 2023-10-17 NOTE — Addendum Note (Signed)
Addended by: Lonna Cobb K on: 10/17/2023 01:09 PM   Modules accepted: Orders

## 2023-10-17 NOTE — Progress Notes (Signed)
Patient seen by Lonna Cobb NP today  Vitals are within treatment parameters:No (Please specify and give further instructions.)b/p 96/77  Labs are within treatment parameters: No (Please specify and give further instructions.)Hgb 7.9 Np is aware   Treatment plan has been signed: Yes   Per physician team, Patient will not be receiving treatment today.  Patient will received IVF.

## 2023-10-17 NOTE — Telephone Encounter (Signed)
Called regarding appts today, no answer

## 2023-10-17 NOTE — Patient Instructions (Signed)

## 2023-10-17 NOTE — Progress Notes (Addendum)
[facility] OFFICE PROGRESS NOTE   Diagnosis: Pancreas cancer  INTERVAL HISTORY:   Ms. Chaloux returns as scheduled.  She completed cycle 1 gemcitabine/cisplatin 09/29/2023.  She feels that baseline nausea/vomiting increased after chemotherapy.  She is not sure for how many days.  Bowels are moving.  No diarrhea.  No fever or rash after treatment.  She has noted increased sleepiness.  She recently ran out of oxycodone.  She confirms she is taking the liquid morphine and MS Contin.  She took the liquid morphine before coming to the office today.  She wonders if the sleepiness is due to her pain medication.  Appetite is poor.  No bleeding.  Objective:  Vital signs in last 24 hours:  Blood pressure 96/77, pulse 100, temperature 98.2 F (36.8 C), temperature source Temporal, resp. rate 18, height 5\' 4"  (1.626 m), weight 161 lb (73 kg), SpO2 100%.    HEENT: Significant thrush. Resp: Bilateral wheezes.  No respiratory distress. Cardio: Regular rate and rhythm. GI: Abdomen soft, tender across the upper abdomen.  No hepatomegaly. Vascular: No leg edema. Neuro: Intermittently lethargic, arouses easily to mild stimuli, oriented.  Follows commands. Skin: Skin turgor is intact. Port-A-Cath without erythema.  Lab Results:  Lab Results  Component Value Date   WBC 5.8 10/17/2023   HGB 7.9 (L) 10/17/2023   HCT 25.1 (L) 10/17/2023   MCV 82.3 10/17/2023   PLT 711 (H) 10/17/2023   NEUTROABS 4.0 10/17/2023    Imaging:  No results found.  Medications: I have reviewed the patient's current medications.  Assessment/Plan: Pancreas cancer -CT abdomen/pelvis without contrast 02/16/2021-multiple large hypoechoic masses in the patient's liver consistent metastatic disease, ill-defined masslike area in the pancreatic head measuring approximate 4.7 cm, possible filling defect in the right ventricle. -EGD 02/19/2021-large infiltrative mass with bleeding in the second portion of the  duodenum, appears to be arising near the ampulla, partially obstructive with friable mucosa.  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 -Flexible sigmoidoscopy 02/19/2021-diverticulosis in the sigmoid colon, nonbleeding external and internal hemorrhoids -Cardiac CT 02/20/2021-mass in the mid RV attached to the interventricular septum measuring 16 mm x 6 mm and concerning for metastatic tumor -MRI abdomen with/without contrast 02/21/2021-mass in the posterior pancreatic head measuring 4.3 x 3.8 cm with obstruction of the central pancreatic duct and diffuse dilatation up to 6 mm consistent with pancreatic adenocarcinoma, liver lesions the largest mass of the central left lobe measuring 9.9 x 9.2 cm consistent with hepatic metastatic disease,  hepatomegaly. -Ultrasound-guided biopsy of a left liver lesion 02/24/2021-adenocarcinoma, morphologically similar to the duodenal biopsy; microsatellite stable, tumor mutation burden 1 -Negative genetic testing -Palliative radiation to the duodenal mass 02/25/2021-03/06/2021 -Cycle 1 FOLFOX 03/17/2021 -Cycle 2 FOLFOX 04/01/2021 -Cycle 3 FOLFIRINOX 04/14/2021 -Cycle 4 FOLFIRINOX 04/27/2021, Udenyca added -Cycle 5 FOLFIRINOX 05/12/2021, Udenyca -MRI abdomen 05/23/2021-decrease in size of pancreas mass and hepatic lesions, no progressive disease -Cycle 6 FOLFIRINOX 05/27/2021 -CT 06/17/2021-multiple liver metastases similar to her prior exam.  Pancreas mass not significantly changed. -CT 06/26/2021-stable pancreas mass, no significant change in size and number of known liver metastases. -CT abdomen/pelvis 07/12/2021-new gastrostomy tube.  Multiple liver masses slightly decreased in size from prior exam.  Pancreas head mass unchanged. -Cycle 1 gemcitabine/Abraxane 07/30/2021 -Cycle 2 gemcitabine/Abraxane 08/13/2021 -Cycle 3 gemcitabine 08/27/2021, Abraxane held due to significant bilateral toe pain, question neuropathy -Cycle 4 gemcitabine, Abraxane held due  to neuropathy, 09/10/2021 -Cycle 5 gemcitabine, Abraxane held due to neuropathy, 09/24/2021 -Cycle 6 gemcitabine, Abraxane held due to neuropathy  10/16/2021 -CTs 10/29/2021-mild progression of multifocal liver metastases; mild increase in size of hypoenhancing mass within the head of the pancreas; new mild pleural nodularity along the posterior lower lobes -Cycle 7 gemcitabine/Abraxane 11/03/2021 -Cycle 8 gemcitabine/Abraxane 11/23/2021 -Cycle 9 gemcitabine/Abraxane 12/10/2021 -Cycle 10 Gemcitabine/Abraxane 12/24/2021 -CT abdomen/pelvis 01/04/2022-decrease size of pancreas head mass, no change in multifocal hepatic metastases -Cycle 11 gemcitabine/Abraxane 01/07/2022 -Cycle 12 gemcitabine/Abraxane 02/04/2022 -Cycle 13 Gemcitabine/Abraxane 02/18/2022 -Cycle 14 gemcitabine/Abraxane 03/11/2022 -Cycle 15 gemcitabine/Abraxane 03/29/2022 -Cycle 16 gemcitabine/Abraxane 04/16/2022 -CT abdomen/pelvis 04/22/2022-mixed response in the liver, some lesions larger, some smaller, no change in pancreas head mass, no evidence of metastatic disease elsewhere in the abdomen or pelvis -Cycle 17 gemcitabine/Abraxane 05/17/2022 -Cycle 18 Gemcitabine/Abraxane 06/01/2022 -Cycle 19 gemcitabine/Abraxane 06/18/2022 -Cycle 20 gemcitabine/Abraxane 07/05/2022 -Cycle 21 gemcitabine/Abraxane 08/09/2022 -Cycle 22 gemcitabine/Abraxane 08/23/2022 -CT abdomen/pelvis 09/01/2022-stable liver metastases, difficult to visualize the pancreas head mass, no evidence of disease progression -Cycle 23 gemcitabine/Abraxane 09/06/2022 -Cycle 24 gemcitabine/Abraxane 09/20/2022 -Cycle 25 gemcitabine/Abraxane 10/18/2022 -Cycle 26 gemcitabine/Abraxane 11/01/2022 -Cycle 27 gemcitabine/Abraxane 11/29/2022 -CT abdomen/pelvis 12/14/2022-increased size of liver metastases, less well-defined pancreas mass, 4 mm right lung nodule -Cycle 1 FOLFIRINOX 01/10/2023 -Cycle 2 FOLFIRINOX 01/24/2023, 5-FU dose reduced secondary to mucositis, chemotherapy plan dose reduce for weight  loss -Develop pruritus during the oxaliplatin infusion, initial improvement with Benadryl and Solu-Medrol, oxaliplatin was resumed and the pruritus recurred.  Oxaliplatin was discontinued. -Chemotherapy held 02/07/2023 due to diarrhea, nausea/vomiting, dehydration -Cycle 3 FOLFIRINOX 03/07/2023 -Cycle 4 FOLFIRINOX 03/22/2023, 5-FU dose reduction due to mucositis, chemotherapy plan dose reduction for weight loss -CTs 03/31/2023-numerous bulky liver lesions, majority are slightly diminished in size though some lesions are unchanged or slightly enlarged. -Cycle 5 FOLFIRINOX 04/05/2023 -04/05/2023 reaction to oxaliplatin with dyspnea and pruritus -FOLFIRI every 3 weeks beginning 04/27/2023 -FOLFIRI 05/25/2023 -CTs 07/04/2023-widespread hepatic metastatic disease again noted, some of the lesions superiorly in the right lobe may be mildly progressive, direct comparison limited by lack of contrast.  Grossly unchanged pancreatic head mass.  Constipation. -CTs 08/24/2023-no change in multifocal hepatic metastasis.  No change in primary pancreatic head mass.  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  Nodule at the left lung base medially is increased from CT abdomen 07/04/2023 -CTs 09/20/2023-bilateral pulmonary nodule slightly larger, bulky hepatic metastases with interval increase, potential new 13 mm left paraesophageal node, stable appearance of the pancreatic head lesion with diffuse dilatation of the main pancreatic duct.  No hydronephrosis/hydroureter -Cycle 1 gemcitabine/cisplatin 09/29/2023 2.  Iron deficiency anemia-improved -Feraheme 510 mg 02/20/2021 3.  Protein calorie malnutrition 4.  Possible right ventricular filling defect on noncontrast CT scan MRI cardiac morphology 02/21/2021-mass in the mid RV attached to the interventricular septum measures 16 mm x 6 mm.  Mass appears heterogeneous with area of enhancement on LGE imaging.  Mass is concerning for metastatic tumor.  Normal LV  size and systolic function.  Normal RV size and systolic function. 5.  Hypertension 6.  History of CVA 7.  Hyperlipidemia 8.  Hypothyroidism 9.  Morbid obesity 10.  Asthma 11.  Migraines 12.  Pain secondary to #1 13.  Port-A-Cath placement 02/25/2021 14.  Hypokalemia 04/01/2021-started a potassium supplement 15.  Hospital admission 06/17/2021-intractable nausea and vomiting EGD 06/19/2021-diffuse gastritis, nonobstructing duodenal mass, biopsy of stomach-no malignancy or H. pylori, duodenal mass-adenocarcinoma Small bowel endoscopy 08/04/2023-narrowing at the second and third portion of the duodenum, stent placed, biopsy from the duodenum negative for malignancy 16.  Hospital admission 07/12/2021 with nausea/vomiting, abdominal pain, and hypokalemia 17.  Percutaneous gastrostomy tube placement 07/01/2021 18.  07/21/2022 bilateral lower extremity venous Doppler study-negative for DVT. 19.  Oral/esophageal candidiasis August 2024-Diflucan  Disposition: Ms. Zaccaro has completed 1 cycle of gemcitabine/cisplatin.  She is scheduled for cycle 2 today.  We are holding today's treatment and rescheduling for 1 week.  She has significant oral candidiasis.  She is likely dehydrated.  She will receive IV fluids in the office today.  We will repeat vital signs.  She will begin a 7-day course of Diflucan followed by Mycelex troche.  Prescriptions sent to her pharmacy.  The lethargy is likely due to pain medication and potentially dehydration.  She will be reassessed following IV fluids.  She will return for follow-up and possible treatment in 1 week.    Lonna Cobb ANP/GNP-BC   10/17/2023  9:51 AM  Addendum 10/17/2023-blood pressure improved following 500 cc normal saline, 116/83.  She was sleeping in the infusion area.  She arouses easily.  I confirmed with her she took a dose of the morphine concentrate prior to coming to the office today.  The pain medication is likely causing sedation.  She will try  oxycodone for breakthrough pain rather than the morphine concentrate.

## 2023-10-17 NOTE — Progress Notes (Signed)
Patient knows to be back in the clinic on Friday 10/19/2023 for her Lab/Flush/MD Visit.

## 2023-10-20 ENCOUNTER — Other Ambulatory Visit: Payer: Self-pay | Admitting: *Deleted

## 2023-10-20 DIAGNOSIS — C250 Malignant neoplasm of head of pancreas: Secondary | ICD-10-CM

## 2023-10-21 ENCOUNTER — Other Ambulatory Visit (HOSPITAL_BASED_OUTPATIENT_CLINIC_OR_DEPARTMENT_OTHER): Payer: Self-pay

## 2023-10-21 ENCOUNTER — Inpatient Hospital Stay: Payer: 59

## 2023-10-21 ENCOUNTER — Other Ambulatory Visit: Payer: Self-pay | Admitting: *Deleted

## 2023-10-21 ENCOUNTER — Inpatient Hospital Stay (HOSPITAL_BASED_OUTPATIENT_CLINIC_OR_DEPARTMENT_OTHER): Payer: 59 | Admitting: Oncology

## 2023-10-21 VITALS — BP 97/75 | HR 100 | Temp 98.1°F | Resp 20 | Ht 64.0 in | Wt 163.0 lb

## 2023-10-21 VITALS — BP 113/81 | HR 102 | Temp 98.7°F | Resp 19

## 2023-10-21 DIAGNOSIS — C250 Malignant neoplasm of head of pancreas: Secondary | ICD-10-CM | POA: Diagnosis not present

## 2023-10-21 DIAGNOSIS — C259 Malignant neoplasm of pancreas, unspecified: Secondary | ICD-10-CM

## 2023-10-21 DIAGNOSIS — E860 Dehydration: Secondary | ICD-10-CM

## 2023-10-21 DIAGNOSIS — Z95828 Presence of other vascular implants and grafts: Secondary | ICD-10-CM

## 2023-10-21 LAB — CMP (CANCER CENTER ONLY)
ALT: 11 U/L (ref 0–44)
AST: 33 U/L (ref 15–41)
Albumin: 3 g/dL — ABNORMAL LOW (ref 3.5–5.0)
Alkaline Phosphatase: 150 U/L — ABNORMAL HIGH (ref 38–126)
Anion gap: 8 (ref 5–15)
BUN: 33 mg/dL — ABNORMAL HIGH (ref 6–20)
CO2: 28 mmol/L (ref 22–32)
Calcium: 9.9 mg/dL (ref 8.9–10.3)
Chloride: 96 mmol/L — ABNORMAL LOW (ref 98–111)
Creatinine: 2.03 mg/dL — ABNORMAL HIGH (ref 0.44–1.00)
GFR, Estimated: 28 mL/min — ABNORMAL LOW (ref >60)
Glucose, Bld: 115 mg/dL — ABNORMAL HIGH (ref 70–99)
Potassium: 4.7 mmol/L (ref 3.5–5.1)
Sodium: 132 mmol/L — ABNORMAL LOW (ref 135–145)
Total Bilirubin: 1.1 mg/dL (ref <1.2)
Total Protein: 7.2 g/dL (ref 6.5–8.1)

## 2023-10-21 LAB — CBC WITH DIFFERENTIAL (CANCER CENTER ONLY)
Abs Immature Granulocytes: 0.29 10*3/uL — ABNORMAL HIGH (ref 0.00–0.07)
Basophils Absolute: 0 10*3/uL (ref 0.0–0.1)
Basophils Relative: 0 %
Eosinophils Absolute: 0 10*3/uL (ref 0.0–0.5)
Eosinophils Relative: 0 %
HCT: 27.8 % — ABNORMAL LOW (ref 36.0–46.0)
Hemoglobin: 8.7 g/dL — ABNORMAL LOW (ref 12.0–15.0)
Immature Granulocytes: 2 %
Lymphocytes Relative: 14 %
Lymphs Abs: 1.7 10*3/uL (ref 0.7–4.0)
MCH: 26 pg (ref 26.0–34.0)
MCHC: 31.3 g/dL (ref 30.0–36.0)
MCV: 83 fL (ref 80.0–100.0)
Monocytes Absolute: 1.2 10*3/uL — ABNORMAL HIGH (ref 0.1–1.0)
Monocytes Relative: 9 %
Neutro Abs: 9 10*3/uL — ABNORMAL HIGH (ref 1.7–7.7)
Neutrophils Relative %: 75 %
Platelet Count: 734 10*3/uL — ABNORMAL HIGH (ref 150–400)
RBC: 3.35 MIL/uL — ABNORMAL LOW (ref 3.87–5.11)
RDW: 21.9 % — ABNORMAL HIGH (ref 11.5–15.5)
WBC Count: 12.3 10*3/uL — ABNORMAL HIGH (ref 4.0–10.5)
nRBC: 0 % (ref 0.0–0.2)

## 2023-10-21 LAB — SAMPLE TO BLOOD BANK

## 2023-10-21 LAB — AMMONIA: Ammonia: 29 umol/L (ref 9–35)

## 2023-10-21 MED ORDER — FLUCONAZOLE 200 MG PO TABS
200.0000 mg | ORAL_TABLET | Freq: Every day | ORAL | 0 refills | Status: AC
  Filled 2023-10-21: qty 7, 7d supply, fill #0

## 2023-10-21 MED ORDER — SODIUM CHLORIDE 0.9% FLUSH
10.0000 mL | Freq: Once | INTRAVENOUS | Status: AC
  Administered 2023-10-21: 10 mL via INTRAVENOUS

## 2023-10-21 MED ORDER — HEPARIN SOD (PORK) LOCK FLUSH 100 UNIT/ML IV SOLN
500.0000 [IU] | Freq: Once | INTRAVENOUS | Status: AC
  Administered 2023-10-21: 500 [IU] via INTRAVENOUS

## 2023-10-21 MED ORDER — SODIUM CHLORIDE 0.9 % IV SOLN
INTRAVENOUS | Status: AC
Start: 2023-10-21 — End: 2023-10-21

## 2023-10-21 MED ORDER — SODIUM CHLORIDE 0.9% FLUSH
10.0000 mL | Freq: Once | INTRAVENOUS | Status: AC
  Administered 2023-10-21: 10 mL

## 2023-10-21 MED ORDER — HEPARIN SOD (PORK) LOCK FLUSH 100 UNIT/ML IV SOLN
500.0000 [IU] | Freq: Once | INTRAVENOUS | Status: AC
  Administered 2023-10-21: 500 [IU]

## 2023-10-21 NOTE — Progress Notes (Signed)
Faxed referral order, demographics, and chart information to [facility] [place] [phone]

## 2023-10-21 NOTE — Progress Notes (Signed)
[facility] OFFICE PROGRESS NOTE   Diagnosis: Pancreas cancer  INTERVAL HISTORY:   Ms. Longstreet returns for a scheduled visit.  She is here alone.  She continues to have nausea.  She has persistent thrush despite Diflucan.  She denies pain this morning.  Her niece and sister were each present telephone for part of today's visit.  Objective:  Vital signs in last 24 hours:  Blood pressure 97/75, pulse 100, temperature 98.1 F (36.7 C), temperature source Temporal, resp. rate 20, height 5\' 4"  (1.626 m), weight 163 lb (73.9 kg), SpO2 100%.    HEENT: Thrush at the tongue, palate, and buccal mucosa Resp: Recent breath sounds with inspiratory rhonchi at the right lower posterior chest, no respiratory distress Cardio: Regular rate and rhythm GI: Firm fullness in the mid upper abdomen, tender throughout the upper abdomen Vascular: Trace pitting edema at the left lower leg, no calf tenderness Neuro: Lethargic, arousable, oriented to place and diagnosis, not oriented to date the pupils are 3-4 mm, equal, and reactive.  The extraocular movements are intact.    Portacath/PICC-without erythema  Lab Results:  Lab Results  Component Value Date   WBC 12.3 (H) 10/21/2023   HGB 8.7 (L) 10/21/2023   HCT 27.8 (L) 10/21/2023   MCV 83.0 10/21/2023   PLT 734 (H) 10/21/2023   NEUTROABS 9.0 (H) 10/21/2023    CMP  Lab Results  Component Value Date   NA 135 10/17/2023   K 4.8 10/17/2023   CL 99 10/17/2023   CO2 29 10/17/2023   GLUCOSE 81 10/17/2023   BUN 25 (H) 10/17/2023   CREATININE 1.13 (H) 10/17/2023   CALCIUM 9.5 10/17/2023   PROT 6.4 (L) 10/17/2023   ALBUMIN 2.9 (L) 10/17/2023   AST 18 10/17/2023   ALT 8 10/17/2023   ALKPHOS 149 (H) 10/17/2023   BILITOT 1.3 (H) 10/17/2023   GFRNONAA 57 (L) 10/17/2023   GFRAA >60 07/21/2020    Lab Results  Component Value Date   CEA1 68.8 (H) 02/23/2021   CEA <0.5 10/15/2008   ZOX096 6,851 (H) 09/26/2023      Medications: I have reviewed the patient's current medications.   Assessment/Plan: Pancreas cancer -CT abdomen/pelvis without contrast 02/16/2021-multiple large hypoechoic masses in the patient's liver consistent metastatic disease, ill-defined masslike area in the pancreatic head measuring approximate 4.7 cm, possible filling defect in the right ventricle. -EGD 02/19/2021-large infiltrative mass with bleeding in the second portion of the duodenum, appears to be arising near the ampulla, partially obstructive with friable mucosa.  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 -Flexible sigmoidoscopy 02/19/2021-diverticulosis in the sigmoid colon, nonbleeding external and internal hemorrhoids -Cardiac CT 02/20/2021-mass in the mid RV attached to the interventricular septum measuring 16 mm x 6 mm and concerning for metastatic tumor -MRI abdomen with/without contrast 02/21/2021-mass in the posterior pancreatic head measuring 4.3 x 3.8 cm with obstruction of the central pancreatic duct and diffuse dilatation up to 6 mm consistent with pancreatic adenocarcinoma, liver lesions the largest mass of the central left lobe measuring 9.9 x 9.2 cm consistent with hepatic metastatic disease,  hepatomegaly. -Ultrasound-guided biopsy of a left liver lesion 02/24/2021-adenocarcinoma, morphologically similar to the duodenal biopsy; microsatellite stable, tumor mutation burden 1 -Negative genetic testing -Palliative radiation to the duodenal mass 02/25/2021-03/06/2021 -Cycle 1 FOLFOX 03/17/2021 -Cycle 2 FOLFOX 04/01/2021 -Cycle 3 FOLFIRINOX 04/14/2021 -Cycle 4 FOLFIRINOX 04/27/2021, Udenyca added -Cycle 5 FOLFIRINOX 05/12/2021, Udenyca -MRI abdomen 05/23/2021-decrease in size of pancreas mass and hepatic lesions, no progressive disease -Cycle  6 FOLFIRINOX 05/27/2021 -CT 06/17/2021-multiple liver metastases similar to her prior exam.  Pancreas mass not significantly changed. -CT 06/26/2021-stable  pancreas mass, no significant change in size and number of known liver metastases. -CT abdomen/pelvis 07/12/2021-new gastrostomy tube.  Multiple liver masses slightly decreased in size from prior exam.  Pancreas head mass unchanged. -Cycle 1 gemcitabine/Abraxane 07/30/2021 -Cycle 2 gemcitabine/Abraxane 08/13/2021 -Cycle 3 gemcitabine 08/27/2021, Abraxane held due to significant bilateral toe pain, question neuropathy -Cycle 4 gemcitabine, Abraxane held due to neuropathy, 09/10/2021 -Cycle 5 gemcitabine, Abraxane held due to neuropathy, 09/24/2021 -Cycle 6 gemcitabine, Abraxane held due to neuropathy 10/16/2021 -CTs 10/29/2021-mild progression of multifocal liver metastases; mild increase in size of hypoenhancing mass within the head of the pancreas; new mild pleural nodularity along the posterior lower lobes -Cycle 7 gemcitabine/Abraxane 11/03/2021 -Cycle 8 gemcitabine/Abraxane 11/23/2021 -Cycle 9 gemcitabine/Abraxane 12/10/2021 -Cycle 10 Gemcitabine/Abraxane 12/24/2021 -CT abdomen/pelvis 01/04/2022-decrease size of pancreas head mass, no change in multifocal hepatic metastases -Cycle 11 gemcitabine/Abraxane 01/07/2022 -Cycle 12 gemcitabine/Abraxane 02/04/2022 -Cycle 13 Gemcitabine/Abraxane 02/18/2022 -Cycle 14 gemcitabine/Abraxane 03/11/2022 -Cycle 15 gemcitabine/Abraxane 03/29/2022 -Cycle 16 gemcitabine/Abraxane 04/16/2022 -CT abdomen/pelvis 04/22/2022-mixed response in the liver, some lesions larger, some smaller, no change in pancreas head mass, no evidence of metastatic disease elsewhere in the abdomen or pelvis -Cycle 17 gemcitabine/Abraxane 05/17/2022 -Cycle 18 Gemcitabine/Abraxane 06/01/2022 -Cycle 19 gemcitabine/Abraxane 06/18/2022 -Cycle 20 gemcitabine/Abraxane 07/05/2022 -Cycle 21 gemcitabine/Abraxane 08/09/2022 -Cycle 22 gemcitabine/Abraxane 08/23/2022 -CT abdomen/pelvis 09/01/2022-stable liver metastases, difficult to visualize the pancreas head mass, no evidence of disease progression -Cycle 23  gemcitabine/Abraxane 09/06/2022 -Cycle 24 gemcitabine/Abraxane 09/20/2022 -Cycle 25 gemcitabine/Abraxane 10/18/2022 -Cycle 26 gemcitabine/Abraxane 11/01/2022 -Cycle 27 gemcitabine/Abraxane 11/29/2022 -CT abdomen/pelvis 12/14/2022-increased size of liver metastases, less well-defined pancreas mass, 4 mm right lung nodule -Cycle 1 FOLFIRINOX 01/10/2023 -Cycle 2 FOLFIRINOX 01/24/2023, 5-FU dose reduced secondary to mucositis, chemotherapy plan dose reduce for weight loss -Develop pruritus during the oxaliplatin infusion, initial improvement with Benadryl and Solu-Medrol, oxaliplatin was resumed and the pruritus recurred.  Oxaliplatin was discontinued. -Chemotherapy held 02/07/2023 due to diarrhea, nausea/vomiting, dehydration -Cycle 3 FOLFIRINOX 03/07/2023 -Cycle 4 FOLFIRINOX 03/22/2023, 5-FU dose reduction due to mucositis, chemotherapy plan dose reduction for weight loss -CTs 03/31/2023-numerous bulky liver lesions, majority are slightly diminished in size though some lesions are unchanged or slightly enlarged. -Cycle 5 FOLFIRINOX 04/05/2023 -04/05/2023 reaction to oxaliplatin with dyspnea and pruritus -FOLFIRI every 3 weeks beginning 04/27/2023 -FOLFIRI 05/25/2023 -CTs 07/04/2023-widespread hepatic metastatic disease again noted, some of the lesions superiorly in the right lobe may be mildly progressive, direct comparison limited by lack of contrast.  Grossly unchanged pancreatic head mass.  Constipation. -CTs 08/24/2023-no change in multifocal hepatic metastasis.  No change in primary pancreatic head mass.  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  Nodule at the left lung base medially is increased from CT abdomen 07/04/2023 -CTs 09/20/2023-bilateral pulmonary nodule slightly larger, bulky hepatic metastases with interval increase, potential new 13 mm left paraesophageal node, stable appearance of the pancreatic head lesion with diffuse dilatation of the main pancreatic duct.  No  hydronephrosis/hydroureter -Cycle 1 gemcitabine/cisplatin 09/29/2023 2.  Iron deficiency anemia-improved -Feraheme 510 mg 02/20/2021 3.  Protein calorie malnutrition 4.  Possible right ventricular filling defect on noncontrast CT scan MRI cardiac morphology 02/21/2021-mass in the mid RV attached to the interventricular septum measures 16 mm x 6 mm.  Mass appears heterogeneous with area of enhancement on LGE imaging.  Mass is concerning for metastatic tumor.  Normal LV size and systolic function.  Normal RV size and systolic function. 5.  Hypertension 6.  History of CVA 7.  Hyperlipidemia 8.  Hypothyroidism 9.  Morbid obesity 10.  Asthma 11.  Migraines 12.  Pain secondary to #1 13.  Port-A-Cath placement 02/25/2021 14.  Hypokalemia 04/01/2021-started a potassium supplement 15.  Hospital admission 06/17/2021-intractable nausea and vomiting EGD 06/19/2021-diffuse gastritis, nonobstructing duodenal mass, biopsy of stomach-no malignancy or H. pylori, duodenal mass-adenocarcinoma Small bowel endoscopy 08/04/2023-narrowing at the second and third portion of the duodenum, stent placed, biopsy from the duodenum negative for malignancy 16.  Hospital admission 07/12/2021 with nausea/vomiting, abdominal pain, and hypokalemia 17.  Percutaneous gastrostomy tube placement 07/01/2021 18.  07/21/2022 bilateral lower extremity venous Doppler study-negative for DVT. 19.  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  She was diagnosed with pancreas cancer in March 2022.  She has been treated with multiple systemic therapy regimens.  There is now clinical evidence of disease progression.  She has a poor performance status.  She appears confused.  The confusion may be related to hepatic encephalopathy.  I have a low clinical suspicion for brain metastases.  Ms. Talbott does not appear to be a candidate for further chemotherapy.  I recommend hospice care.   She and her niece are in agreement.  We discussed CPR and ACLS.  She agrees to a no CODE BLUE status.  We will make a referral to [facility] [place].  Ms. Freedman will complete a course of increased dose Diflucan for the persistent oral candidiasis.  She will continue the current narcotic pain regimen.  The pain regimen can be adjusted as needed by the hospice team.  Ms. Treml will be scheduled for an office visit next week.  Thornton Papas, MD  10/21/2023  12:39 PM

## 2023-10-21 NOTE — Patient Instructions (Signed)

## 2023-10-24 ENCOUNTER — Inpatient Hospital Stay: Payer: 59

## 2023-10-24 ENCOUNTER — Ambulatory Visit: Payer: 59

## 2023-10-24 ENCOUNTER — Other Ambulatory Visit: Payer: 59

## 2023-10-24 ENCOUNTER — Telehealth: Payer: Self-pay | Admitting: Family

## 2023-10-24 ENCOUNTER — Ambulatory Visit: Payer: 59 | Admitting: Oncology

## 2023-10-24 NOTE — Telephone Encounter (Signed)
[facility] stated a [employer] form was faxed today with [employer] instead of medicaid. They stated this needed to be resent with medicaid direct.[phone]

## 2023-10-25 NOTE — Telephone Encounter (Signed)
Form has been faxed.

## 2023-10-27 ENCOUNTER — Inpatient Hospital Stay: Payer: 59 | Admitting: Nurse Practitioner

## 2023-10-31 ENCOUNTER — Inpatient Hospital Stay: Payer: 59

## 2023-10-31 ENCOUNTER — Inpatient Hospital Stay: Payer: 59 | Admitting: Nurse Practitioner

## 2023-10-31 ENCOUNTER — Inpatient Hospital Stay: Payer: 59 | Admitting: Oncology

## 2023-11-04 ENCOUNTER — Other Ambulatory Visit: Payer: Self-pay | Admitting: Oncology

## 2023-11-04 DIAGNOSIS — C250 Malignant neoplasm of head of pancreas: Secondary | ICD-10-CM

## 2023-11-07 ENCOUNTER — Ambulatory Visit: Payer: 59 | Admitting: Nurse Practitioner

## 2023-11-07 ENCOUNTER — Inpatient Hospital Stay: Payer: 59 | Admitting: Nurse Practitioner

## 2023-11-07 ENCOUNTER — Inpatient Hospital Stay: Payer: 59

## 2023-11-07 ENCOUNTER — Ambulatory Visit: Payer: 59

## 2023-11-07 ENCOUNTER — Other Ambulatory Visit: Payer: 59

## 2023-11-14 ENCOUNTER — Other Ambulatory Visit: Payer: 59

## 2023-11-14 ENCOUNTER — Encounter: Payer: Self-pay | Admitting: *Deleted

## 2023-11-14 ENCOUNTER — Ambulatory Visit: Payer: 59

## 2023-11-14 ENCOUNTER — Ambulatory Visit: Payer: 59 | Admitting: Nurse Practitioner

## 2023-11-14 NOTE — Progress Notes (Signed)
Notified by [facility] that patient died at home on 11-20-23.

## 2023-11-21 ENCOUNTER — Inpatient Hospital Stay: Payer: 59 | Admitting: Oncology

## 2023-11-21 ENCOUNTER — Inpatient Hospital Stay: Payer: 59

## 2024-01-10 ENCOUNTER — Ambulatory Visit: Payer: 59 | Admitting: Gastroenterology

## 2024-01-31 ENCOUNTER — Encounter: Payer: Self-pay | Admitting: Oncology

## 2024-02-07 ENCOUNTER — Ambulatory Visit: Payer: Medicaid Other | Admitting: Gastroenterology
